# Patient Record
Sex: Male | Born: 1940
Health system: Southern US, Community
[De-identification: ages and names within clinical notes are randomized; demographics above are authoritative.]

## PROBLEM LIST (undated history)

## (undated) DIAGNOSIS — G4733 Obstructive sleep apnea (adult) (pediatric): Secondary | ICD-10-CM

## (undated) DIAGNOSIS — G629 Polyneuropathy, unspecified: Secondary | ICD-10-CM

## (undated) DIAGNOSIS — D696 Thrombocytopenia, unspecified: Secondary | ICD-10-CM

## (undated) DIAGNOSIS — C61 Malignant neoplasm of prostate: Secondary | ICD-10-CM

## (undated) DIAGNOSIS — M549 Dorsalgia, unspecified: Secondary | ICD-10-CM

## (undated) DIAGNOSIS — K5900 Constipation, unspecified: Secondary | ICD-10-CM

## (undated) DIAGNOSIS — I38 Endocarditis, valve unspecified: Secondary | ICD-10-CM

## (undated) DIAGNOSIS — Z86718 Personal history of other venous thrombosis and embolism: Secondary | ICD-10-CM

## (undated) DIAGNOSIS — D649 Anemia, unspecified: Secondary | ICD-10-CM

## (undated) DIAGNOSIS — R609 Edema, unspecified: Secondary | ICD-10-CM

## (undated) DIAGNOSIS — Z9289 Personal history of other medical treatment: Secondary | ICD-10-CM

## (undated) DIAGNOSIS — I350 Nonrheumatic aortic (valve) stenosis: Secondary | ICD-10-CM

## (undated) DIAGNOSIS — M542 Cervicalgia: Secondary | ICD-10-CM

## (undated) DIAGNOSIS — H269 Unspecified cataract: Secondary | ICD-10-CM

## (undated) DIAGNOSIS — T4145XA Adverse effect of unspecified anesthetic, initial encounter: Secondary | ICD-10-CM

## (undated) DIAGNOSIS — E785 Hyperlipidemia, unspecified: Secondary | ICD-10-CM

## (undated) DIAGNOSIS — I440 Atrioventricular block, first degree: Secondary | ICD-10-CM

## (undated) DIAGNOSIS — R0602 Shortness of breath: Secondary | ICD-10-CM

## (undated) DIAGNOSIS — T8859XA Other complications of anesthesia, initial encounter: Secondary | ICD-10-CM

## (undated) DIAGNOSIS — R350 Frequency of micturition: Secondary | ICD-10-CM

## (undated) DIAGNOSIS — I4510 Unspecified right bundle-branch block: Secondary | ICD-10-CM

## (undated) DIAGNOSIS — L0390 Cellulitis, unspecified: Secondary | ICD-10-CM

## (undated) DIAGNOSIS — R3915 Urgency of urination: Secondary | ICD-10-CM

## (undated) DIAGNOSIS — I509 Heart failure, unspecified: Secondary | ICD-10-CM

## (undated) DIAGNOSIS — I10 Essential (primary) hypertension: Secondary | ICD-10-CM

## (undated) DIAGNOSIS — M1990 Unspecified osteoarthritis, unspecified site: Secondary | ICD-10-CM

## (undated) DIAGNOSIS — M7989 Other specified soft tissue disorders: Secondary | ICD-10-CM

## (undated) DIAGNOSIS — M545 Low back pain: Secondary | ICD-10-CM

## (undated) DIAGNOSIS — G5730 Lesion of lateral popliteal nerve, unspecified lower limb: Secondary | ICD-10-CM

## (undated) DIAGNOSIS — M719 Bursopathy, unspecified: Secondary | ICD-10-CM

## (undated) DIAGNOSIS — I872 Venous insufficiency (chronic) (peripheral): Secondary | ICD-10-CM

## (undated) DIAGNOSIS — J189 Pneumonia, unspecified organism: Secondary | ICD-10-CM

## (undated) DIAGNOSIS — Z8619 Personal history of other infectious and parasitic diseases: Secondary | ICD-10-CM

## (undated) DIAGNOSIS — I4819 Other persistent atrial fibrillation: Secondary | ICD-10-CM

## (undated) DIAGNOSIS — M2550 Pain in unspecified joint: Secondary | ICD-10-CM

## (undated) DIAGNOSIS — IMO0002 Reserved for concepts with insufficient information to code with codable children: Secondary | ICD-10-CM

## (undated) HISTORY — DX: Obstructive sleep apnea (adult) (pediatric): G47.33

## (undated) HISTORY — DX: Nonrheumatic aortic (valve) stenosis: I35.0

## (undated) HISTORY — DX: Unspecified osteoarthritis, unspecified site: M19.90

## (undated) HISTORY — PX: TOTAL KNEE ARTHROPLASTY: SHX125

## (undated) HISTORY — DX: Endocarditis, valve unspecified: I38

## (undated) HISTORY — PX: KNEE SURGERY: SHX244

## (undated) HISTORY — PX: SPINE SURGERY: SHX786

## (undated) HISTORY — DX: Personal history of other infectious and parasitic diseases: Z86.19

## (undated) HISTORY — DX: Bursopathy, unspecified: M71.9

## (undated) HISTORY — DX: Lesion of lateral popliteal nerve, unspecified lower limb: G57.30

## (undated) HISTORY — DX: Dorsalgia, unspecified: M54.9

## (undated) HISTORY — DX: Cervicalgia: M54.2

## (undated) HISTORY — DX: Pain in unspecified joint: M25.50

## (undated) HISTORY — PX: HERNIA REPAIR: SHX51

## (undated) HISTORY — DX: Morbid (severe) obesity due to excess calories: E66.01

## (undated) HISTORY — DX: Reserved for concepts with insufficient information to code with codable children: IMO0002

## (undated) HISTORY — DX: Hyperlipidemia, unspecified: E78.5

## (undated) HISTORY — DX: Anemia, unspecified: D64.9

## (undated) HISTORY — DX: Cellulitis, unspecified: L03.90

## (undated) HISTORY — DX: Low back pain: M54.5

## (undated) HISTORY — PX: LAMINOTOMY: SHX998

## (undated) HISTORY — DX: Other specified soft tissue disorders: M79.89

## (undated) HISTORY — DX: Shortness of breath: R06.02

---
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
## 1985-06-21 ENCOUNTER — Encounter: Payer: Self-pay | Admitting: Internal Medicine

## 1985-07-17 ENCOUNTER — Encounter (INDEPENDENT_AMBULATORY_CARE_PROVIDER_SITE_OTHER): Payer: Self-pay | Admitting: *Deleted

## 1990-06-30 ENCOUNTER — Encounter: Payer: Self-pay | Admitting: Internal Medicine

## 1992-07-21 HISTORY — PX: LAMINOTOMY: SHX998

## 2005-07-05 ENCOUNTER — Encounter: Admission: RE | Admit: 2005-07-05 | Discharge: 2005-07-05 | Payer: Self-pay | Admitting: Neurosurgery

## 2006-01-20 ENCOUNTER — Ambulatory Visit: Payer: Self-pay | Admitting: Internal Medicine

## 2006-11-24 ENCOUNTER — Encounter: Payer: Self-pay | Admitting: Cardiovascular Disease

## 2007-01-20 ENCOUNTER — Ambulatory Visit: Payer: Self-pay | Admitting: Internal Medicine

## 2007-05-18 ENCOUNTER — Encounter
Admission: RE | Admit: 2007-05-18 | Discharge: 2007-05-18 | Payer: Self-pay | Admitting: Physical Medicine and Rehabilitation

## 2007-10-24 ENCOUNTER — Encounter: Payer: Self-pay | Admitting: Cardiovascular Disease

## 2007-12-11 ENCOUNTER — Inpatient Hospital Stay (HOSPITAL_COMMUNITY): Admission: RE | Admit: 2007-12-11 | Discharge: 2007-12-15 | Payer: Self-pay | Admitting: Orthopedic Surgery

## 2007-12-15 ENCOUNTER — Inpatient Hospital Stay (HOSPITAL_COMMUNITY): Admission: EM | Admit: 2007-12-15 | Discharge: 2007-12-22 | Payer: Self-pay | Admitting: Emergency Medicine

## 2008-01-18 DIAGNOSIS — R011 Cardiac murmur, unspecified: Secondary | ICD-10-CM | POA: Insufficient documentation

## 2008-02-19 ENCOUNTER — Ambulatory Visit: Payer: Self-pay | Admitting: Internal Medicine

## 2008-02-19 DIAGNOSIS — G4733 Obstructive sleep apnea (adult) (pediatric): Secondary | ICD-10-CM | POA: Insufficient documentation

## 2008-03-06 ENCOUNTER — Encounter: Admission: RE | Admit: 2008-03-06 | Discharge: 2008-03-06 | Payer: Self-pay | Admitting: Orthopedic Surgery

## 2008-05-20 ENCOUNTER — Ambulatory Visit: Payer: Self-pay | Admitting: Vascular Surgery

## 2008-06-08 ENCOUNTER — Inpatient Hospital Stay (HOSPITAL_COMMUNITY): Admission: EM | Admit: 2008-06-08 | Discharge: 2008-06-14 | Payer: Self-pay | Admitting: Neurosurgery

## 2008-06-13 ENCOUNTER — Ambulatory Visit: Payer: Self-pay | Admitting: Physical Medicine & Rehabilitation

## 2008-06-14 ENCOUNTER — Inpatient Hospital Stay (HOSPITAL_COMMUNITY)
Admission: RE | Admit: 2008-06-14 | Discharge: 2008-06-28 | Payer: Self-pay | Admitting: Physical Medicine & Rehabilitation

## 2008-08-02 ENCOUNTER — Encounter
Admission: RE | Admit: 2008-08-02 | Discharge: 2008-08-05 | Payer: Self-pay | Admitting: Physical Medicine & Rehabilitation

## 2008-08-05 ENCOUNTER — Ambulatory Visit: Payer: Self-pay | Admitting: Physical Medicine & Rehabilitation

## 2009-01-27 ENCOUNTER — Inpatient Hospital Stay (HOSPITAL_COMMUNITY): Admission: RE | Admit: 2009-01-27 | Discharge: 2009-01-30 | Payer: Self-pay | Admitting: Orthopedic Surgery

## 2009-03-18 ENCOUNTER — Ambulatory Visit: Payer: Self-pay | Admitting: Internal Medicine

## 2009-04-01 ENCOUNTER — Telehealth: Payer: Self-pay | Admitting: Internal Medicine

## 2009-05-08 ENCOUNTER — Encounter: Payer: Self-pay | Admitting: Cardiovascular Disease

## 2009-05-15 ENCOUNTER — Encounter: Payer: Self-pay | Admitting: Cardiovascular Disease

## 2009-05-22 ENCOUNTER — Encounter: Payer: Self-pay | Admitting: Internal Medicine

## 2009-06-19 ENCOUNTER — Encounter: Payer: Self-pay | Admitting: Cardiovascular Disease

## 2009-07-03 ENCOUNTER — Telehealth: Payer: Self-pay | Admitting: Internal Medicine

## 2009-08-26 ENCOUNTER — Telehealth: Payer: Self-pay | Admitting: Internal Medicine

## 2009-08-27 ENCOUNTER — Encounter: Payer: Self-pay | Admitting: Internal Medicine

## 2009-09-15 ENCOUNTER — Encounter: Payer: Self-pay | Admitting: Internal Medicine

## 2009-10-02 ENCOUNTER — Encounter: Payer: Self-pay | Admitting: Internal Medicine

## 2009-11-17 ENCOUNTER — Encounter: Payer: Self-pay | Admitting: Cardiovascular Disease

## 2009-11-18 ENCOUNTER — Encounter: Admission: RE | Admit: 2009-11-18 | Discharge: 2009-12-30 | Payer: Self-pay | Admitting: Orthopedic Surgery

## 2010-03-17 ENCOUNTER — Ambulatory Visit: Payer: Self-pay | Admitting: Internal Medicine

## 2010-03-19 ENCOUNTER — Telehealth (INDEPENDENT_AMBULATORY_CARE_PROVIDER_SITE_OTHER): Payer: Self-pay | Admitting: *Deleted

## 2010-04-14 DIAGNOSIS — M1990 Unspecified osteoarthritis, unspecified site: Secondary | ICD-10-CM | POA: Insufficient documentation

## 2010-04-14 DIAGNOSIS — I38 Endocarditis, valve unspecified: Secondary | ICD-10-CM | POA: Insufficient documentation

## 2010-04-14 DIAGNOSIS — E7800 Pure hypercholesterolemia, unspecified: Secondary | ICD-10-CM | POA: Insufficient documentation

## 2010-04-14 DIAGNOSIS — G4730 Sleep apnea, unspecified: Secondary | ICD-10-CM | POA: Insufficient documentation

## 2010-04-14 DIAGNOSIS — M7150 Other bursitis, not elsewhere classified, unspecified site: Secondary | ICD-10-CM | POA: Insufficient documentation

## 2010-04-14 DIAGNOSIS — D649 Anemia, unspecified: Secondary | ICD-10-CM | POA: Insufficient documentation

## 2010-04-14 DIAGNOSIS — D539 Nutritional anemia, unspecified: Secondary | ICD-10-CM | POA: Insufficient documentation

## 2010-04-14 DIAGNOSIS — M539 Dorsopathy, unspecified: Secondary | ICD-10-CM | POA: Insufficient documentation

## 2010-04-14 DIAGNOSIS — I359 Nonrheumatic aortic valve disorder, unspecified: Secondary | ICD-10-CM | POA: Insufficient documentation

## 2010-04-14 DIAGNOSIS — M129 Arthropathy, unspecified: Secondary | ICD-10-CM | POA: Insufficient documentation

## 2010-04-14 DIAGNOSIS — M109 Gout, unspecified: Secondary | ICD-10-CM | POA: Insufficient documentation

## 2010-04-15 ENCOUNTER — Ambulatory Visit: Payer: Self-pay | Admitting: Cardiovascular Disease

## 2010-04-15 DIAGNOSIS — R609 Edema, unspecified: Secondary | ICD-10-CM | POA: Insufficient documentation

## 2010-04-16 ENCOUNTER — Encounter: Payer: Self-pay | Admitting: Cardiovascular Disease

## 2010-05-04 ENCOUNTER — Ambulatory Visit (HOSPITAL_COMMUNITY): Admission: RE | Admit: 2010-05-04 | Discharge: 2010-05-04 | Payer: Self-pay | Admitting: Cardiovascular Disease

## 2010-05-04 ENCOUNTER — Ambulatory Visit: Payer: Self-pay | Admitting: Cardiology

## 2010-05-04 ENCOUNTER — Encounter (INDEPENDENT_AMBULATORY_CARE_PROVIDER_SITE_OTHER): Payer: Self-pay | Admitting: *Deleted

## 2010-05-04 ENCOUNTER — Ambulatory Visit: Payer: Self-pay

## 2010-05-04 ENCOUNTER — Encounter: Payer: Self-pay | Admitting: Cardiovascular Disease

## 2010-05-19 ENCOUNTER — Encounter: Payer: Self-pay | Admitting: Cardiovascular Disease

## 2010-05-27 ENCOUNTER — Ambulatory Visit (HOSPITAL_COMMUNITY): Admission: RE | Admit: 2010-05-27 | Discharge: 2010-05-27 | Payer: Self-pay | Admitting: Cardiovascular Disease

## 2010-05-27 ENCOUNTER — Ambulatory Visit: Payer: Self-pay | Admitting: Cardiovascular Disease

## 2010-05-28 ENCOUNTER — Telehealth: Payer: Self-pay | Admitting: Cardiovascular Disease

## 2010-05-28 ENCOUNTER — Encounter: Payer: Self-pay | Admitting: Cardiovascular Disease

## 2010-07-01 ENCOUNTER — Encounter
Admission: RE | Admit: 2010-07-01 | Discharge: 2010-07-01 | Payer: Self-pay | Source: Home / Self Care | Attending: Cardiovascular Disease | Admitting: Cardiovascular Disease

## 2010-07-01 ENCOUNTER — Encounter: Payer: Self-pay | Admitting: Cardiovascular Disease

## 2010-08-31 ENCOUNTER — Encounter: Payer: Self-pay | Admitting: Internal Medicine

## 2010-10-20 NOTE — Letter (Signed)
Summary: [facility]   Imported By: Marilynne Drivers 04/14/2010 16:58:06  _____________________________________________________________________  External Attachment:    Type:   Image     Comment:   External Document

## 2010-10-20 NOTE — Progress Notes (Signed)
  Records Recieved from [facility] for appt w/ [name] 04/15/10 gave to [place]  March 19, 2010 2:10 PM

## 2010-10-20 NOTE — Letter (Signed)
Summary: [facility]   [facility] [facility]   Imported By: Sallee Provencal 07/20/2010 15:19:31  _____________________________________________________________________  External Attachment:    Type:   Image     Comment:   External Document

## 2010-10-20 NOTE — Progress Notes (Signed)
Summary: pt needs referral to dietician  Phone Note Call from Patient Call back at Home Phone [phone]   Caller: Patient Reason for Call: Talk to Nurse, Talk to Doctor Summary of Call: pt needs to go on a diet and he needs a referral to a reg dietician Initial call taken by: Shelda Pal,  May 28, 2010 10:17 AM  Follow-up for Phone Call        referral made Fredia Beets, RN  May 28, 2010 1:03 PM

## 2010-10-20 NOTE — Letter (Signed)
Summary: TEE Instructions  [place] [place]  [address] [place]   [place], [place] [address]   Phone: [phone]  Fax: [phone]      TEE Instructions  05/19/2010 MRN: [id number]  [address] [place], [place]  [address]      You are scheduled for a TEE on [facility] 05-27-10 with Dr. [name].  Please arrive at the [facility] at 10:30 a.m.  on the day of your procedure.  1)   Diet:     A)   Nothing to eat or drink after midnight except your medications with        a sip of water.    B)   May have clear liquid breakfast.  Clear liquids include:  water, broth,        Sprite, Ginger Ale, black cofee, tea (no sugar), cranberry / grape /        apple juice, jello (not red), popsicle from clear juices (not red).  2)  Must have a responsible person to drive you home.  3)   Bring your current insurance cards and current list of all your medications.   *Special Note:  Every effort is made to have your procedure done on time.  Occasionally there are emergencies that present themselves at the hospital that may cause delays.  Please be patient if a delay does occur.  *If you have any questions after you get home, please call the office at [phone].

## 2010-10-20 NOTE — Letter (Signed)
Summary: CPAP set-up/[facility]  CPAP set-up/[facility]   Imported By: Bubba Hales 09/22/2009 08:37:52  _____________________________________________________________________  External Attachment:    Type:   Image     Comment:   External Document

## 2010-10-20 NOTE — Letter (Signed)
Summary: Outpatient Coinsurance Notice  Outpatient Coinsurance Notice   Imported By: Marilynne Drivers 05/15/2010 12:00:19  _____________________________________________________________________  External Attachment:    Type:   Image     Comment:   External Document

## 2010-10-20 NOTE — Letter (Signed)
Summary: [facility]   Imported By: Marilynne Drivers 04/14/2010 16:54:58  _____________________________________________________________________  External Attachment:    Type:   Image     Comment:   External Document

## 2010-10-20 NOTE — Assessment & Plan Note (Signed)
Summary: f/u 1 yr///kp   Primary Provider/Referring Provider:  [facility]  CC:  follow up visit-sleep.  History of Present Illness: CC:  1 year follow-up sleep apnea.  Wife reports snoring over CPAP.  New machine causing incr nasal congestion and headache and never had before.  [name]  History of Present Illness: 2008/02/25  OBSTRUCTIVE SLEEP APNEA (ICD-327.23) Hx of MURMUR (ICD-785.2) He returns for yearly follow-up of his sleep apnea.  CPAP is set at 17 CWP.  His wife says he snores through this.  He has a nasal mask and humidifier.  He is noticing more daytime sleepiness.  He had an echocardiogram and was told his heart murmur is stable.  He had had a repeat total knee replacement on the right done in March.  He was not told that there were any respiratory problems with surgery.  He has been a pipe smoker.  03/18/09 OSA ....................Marland Kitchenwife here Had vascular evaluationfor peripheral vin/ artery disease but says none found- no insufficiency.  Wife still notices some snore noise. CPAP was increased to 18- Advanced. He feels well rested. One night humidifier off gave him headache. They both feel he is better off, but wife isn't sure he's as good as we can get him.  March 17, 2010- OSA..........................Marland Kitchenwife here Good compliance and fairly good control on CPAP 15 download - AHI 6. He is comfortable with this CPAP pressure and wife confirms no snoring. Little daytime sleepiness.  Not able to lose weight since here. Denies head or chest congestion, chest pain or routine cough. Nasal mask.  Hx Mitral murmur - asks to establish with cardiology here to replace Dr Glade Lloyd.       Preventive Screening-Counseling & Management  Alcohol-Tobacco     Smoking Status: quit     Year Quit: 2008  Current Medications (verified): 1)  Cpap At 15 Cwp Advanced 2)  Diclofenac Sodium 75 Mg  Tbec (Diclofenac Sodium) .... Take 2 By Mouth Once Daily 3)  Lipitor 40 Mg  Tabs (Atorvastatin Calcium) .... Once  Daily 4)  Aspirin 81 Mg Tbec (Aspirin) .... Take 1 Tablet By Mouth Once A Day 5)  Multivitamins   Tabs (Multiple Vitamin) .... Once Daily 6)  Senokot S 8.6-50 Mg Tabs (Sennosides-Docusate Sodium) .... Take 2 Tablets At Bedtime 7)  Percocet 5-325 Mg Tabs (Oxycodone-Acetaminophen) .... Taking 1 Tablet in Am 8)  Lisinopril 10 Mg Tabs (Lisinopril) .... 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  The patient denies shortness of breath at rest, productive cough, non-productive cough, coughing up blood, chest pain, irregular heartbeats, acid heartburn, indigestion, loss of appetite, weight change, abdominal pain, difficulty swallowing, sore throat, tooth/dental problems, headaches, nasal congestion/difficulty breathing through nose, and sneezing.    Vital Signs:  Patient profile:   70  year old male Height:      74 inches O2 Sat:      95 % on Room air Pulse rate:   66 / minute BP sitting:   140 / 78  (right arm) Cuff size:   large  Vitals Entered By: Clayborne Dana CMA (March 17, 2010 2:54 PM)  O2 Flow:  Room air CC: follow up visit-sleep   Physical Exam  Additional Exam:  General: A/Ox3; pleasant and cooperative, NAD, morbidly obese, cheerful and  joking SKIN: no rash, lesions NODES: no lymphadenopathy HEENT: [place]/AT, EOM- WNL, Conjuctivae- clear, PERRLA, TM-WNL, Nose- clear, Throat- clear and wnl, Mallampatii II-III NECK: Supple w/ fair ROM, JVD- none, normal carotid impulses w/o bruits Thyroid-  CHEST: trace wheeze left scapula HEART: RRR, 1-2/ 6 murmur mitral ?  precordial ABDOMEN: Soft and nl; very fat [id number] changes, left knee brace, bandage left calf NEURO: Grossly intact to observation      Impression & Recommendations:  Problem # 1:  OBSTRUCTIVE SLEEP APNEA (ICD-327.23)  Good compliance and control. CPAP is well tolerated now Weight loss would be the key.  Problem # 2:  Hx of MURMUR (ICD-785.2) We will honor his request to refer him to cardiology here since Dr Glade Lloyd has retired  Problem # 3:  OBESITY, MORBID (ICD-278.01) Consider whether he wants bariatric surgery evaluation.  He understands the impact of his weight on general health but I don't feel he is motivated to change lifestyle.  Medications Added to Medication List This Visit: 1)  Lisinopril 10 Mg Tabs (Lisinopril) .... Take 1 by mouth once daily  Other Orders: Est. Patient Level III [id number]) Cardiology Referral (Cardiology)  Patient Instructions: 1)  Please schedule a follow-up appointment in 12 months.  2)  Continue CPAP at 15.

## 2010-10-20 NOTE — Letter (Signed)
Summary: Order for [facility] Diabetes Outpatient Management Services     Order for Nutrition & Diabetes Outpatient Management Services    Imported By: Marilynne Drivers 08/10/2010 10:33:54  _____________________________________________________________________  External Attachment:    Type:   Image     Comment:   External Document

## 2010-10-20 NOTE — Assessment & Plan Note (Signed)
Summary: np3/murmur/jml   Primary Provider:  Dr. Annamaria Boots   History of Present Illness: [name] is a delightful gentleman referred by Dr Redmond Pulling and Annamaria Boots for AS.  He has had a longstanding heart murmur.  He has seen Dr Glade Lloyd for years and also been evaluated by [facility] at times during knee surgeries.  I reviewed 2 separate reports from Dr Michelle Piper office and unfortunatley the echos are non diagnositc with no gradient information and no data on wether the AV is bicuspid or trileaflet.  There was also an echo report from [facility] dated3/02/2007 which showed a mean gradient of 43 mmHg and a peak of 71 mmHg, mild AR and normal EF.  So essentially we have a patient with severe AS that has had poor nondiagnostic imaging with no real data for 3 years.  His activity is limited by his weight, and multiple issues with his knees.  He had a succesful left knee surgery in 2004 but has had 3-4 operations on the right that didn't go so well at [facility] with foot drop as a complicatoin.  He has chronic LE edema but denies history of periop DVT.  Despite his AS he seems to have gotten through 4 knee surgeries, and farily recent neck and back surgery with Dr Sherwood Gambler with no complicaitions.  There is a familial schwanoma syndrome that has caused spinal cord compression for the patient and his two sons.    I spent a long time explaining to the patient my concerns about the severity of his AS and the lack of accurate data in following it.  We will try our own TTE since [facility] seemed to be able to get doppler data.  If the images are not satisfactory he will need a TEE to help guide prognosis and even tell if th evalve is bicuspid.  Risks of the TEE including sedation and airway protection were discussed.    Given his physical limitatations from obesity and knee replacements I don't known that he would have symptoms from his valve until the AVA is critical.  Current Problems (verified): 1)  Valvular Heart Disease   (ICD-424.90) 2)  Aortic Stenosis  (ICD-424.1) 3)  Hx of Murmur  (ICD-785.2) 4)  Osteoarthritis  (ICD-715.90) 5)  Anemia  (ICD-285.9) 6)  Sleep Apnea  (ICD-780.57) 7)  Bursitis  (ICD-727.3) 8)  Gout  (ICD-274.9) 9)  Degenerative Disc Disease  (ICD-722.6) 10)  Arthritis  (ICD-716.90) 11)  Hypercholesterolemia  (ICD-272.0) 12)  Obstructive Sleep Apnea  (ICD-327.23) 13)  Obesity, Morbid  (ICD-278.01)  Current Medications (verified): 1)  Cpap At 15 Cwp Advanced .... As Directed 2)  Diclofenac Sodium 75 Mg  Tbec (Diclofenac Sodium) .... Take 2 By Mouth Once Daily 3)  Lipitor 40 Mg  Tabs (Atorvastatin Calcium) .... Once Daily 4)  Aspirin 81 Mg Tbec (Aspirin) .... Take 1 Tablet By Mouth Once A Day 5)  Multivitamins   Tabs (Multiple Vitamin) .... Once Daily 6)  Senokot S 8.6-50 Mg Tabs (Sennosides-Docusate Sodium) .... Take 2 Tablets At Bedtime 7)  Lisinopril 10 Mg Tabs (Lisinopril) .... Take 1 By Mouth Once Daily  Allergies (verified): No Known Drug Allergies  Past History:  Past Medical History: Last updated: 04/14/2010 VALVULAR HEART DISEASE AORTIC STENOSIS  Moderate-severe aortic stenosis with cardiac murmur. Echo by [facility] 2010 nondiagnostic OSTEOARTHRITIS ANEMIA SLEEP APNEA  BURSITIS GOUT ARTHRITIS  HYPERCHOLESTEROLEMIA OBSTRUCTIVE SLEEP APNEA OBESITY, MORBID  Unstable right total knee Degenerative disk disease with lumbar stenosis Significant right foot drop secondary to peroneal palsy,  preexisting.  Sensory-motor polyneuropathy  Past Surgical History: Last updated: 04/14/2010 R knee replacement in march Had spine surgery.  He has undergone I and D  for hematoma of the right knee.  C6-T2 laminotomy in 1993.  Bilateral shoulder arthropathy and schwannoma ptosis.        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   Vital Signs:  Patient profile:   70 year old male Height:      74 inches Weight:      296 pounds BMI:     38.14 Pulse rate:   82 / minute Resp:     14 per minute BP sitting:   123 / 67  (left arm)  Vitals Entered By: Burnett Kanaris (April 15, 2010 3:32 PM)  Physical Exam  General:  Affect appropriate Healthy:  appears stated age 26: normal Neck supple with no adenopathy JVP normal no bruits no thyromegaly Lungs clear with no wheezing and good diaphragmatic motion Heart:  S1/S2 absent with severe AS  murmur no ,rub, gallop or click PMI normal Abdomen: benighn, BS positve, no tenderness, no AAA no bruit.  No HSM or HJR Distal pulses intact with no bruits Bilateral  edema Neuro non-focal Skin warm and dry    Impression & Recommendations:  Problem # 1:  VALVULAR HEART DISEASE (ICD-424.90) Severe AS with limited serial data since 2008.  F/U TTE and likely TEE.  Would be high risk to even cath and very high risk to refer for AVR.  Suspect that TEE will be needed to finally visualize valve as this has never been able to be done by TTE  Problem # 2:  HYPERCHOLESTEROLEMIA (ICD-272.0) Continue statin labs per primary His updated medication list for this problem includes:    Lipitor 40 Mg Tabs (Atorvastatin calcium) ..... Once daily  Problem # 3:  OSTEOARTHRITIS (ICD-715.90) F/U ortho.  Left knee is still not good.  Disallusioned by [facility].  f/U locally.  High risk for DVT with any further intervention  Problem # 4:  EDEMA (ICD-782.3) Would benefit from a diuretic  Will see what echo looks like and consider  prescribing.  Problem # 5:  SLEEP APNEA (ICD-780.57) Secondary to obesity.  F/U Dr Annamaria Boots continue CPAP.  Increase risk of sedation as relates to this discussed  Problem # 6:  OBESITY, MORBID (ICD-278.01) At this point would not be a candidate for bariatric surgery due to severe AS.  Gave him Dr Manya Silvas name at [facility] regarding ancillary weight loss programs  Other Orders: EKG w/ Interpretation (93000) Echocardiogram (Echo)  Patient Instructions: 1)  Your physician has requested that you have an echocardiogram.  Echocardiography is a painless test that uses sound waves to create images of your heart. It provides your doctor with information about the size and shape of your heart and how well your heart's chambers and valves are working.  This procedure takes approximately one hour. There are no restrictions for this procedure. We will call you with results and decide about follow up. Prescriptions: LISINOPRIL 10  MG TABS (LISINOPRIL) take 1 by mouth once daily  #90 x 3   Entered by:   Burnett Kanaris   Authorized by:   Bosie Clos, MD, [facility]   Signed by:   Burnett Kanaris on 04/15/2010   Method used:   Electronically to        [facility] [name] (retail)       [name]       [place], [place]  [address]       Ph: [phone]       Fax: [phone]   [id number] LISINOPRIL 10 MG TABS (LISINOPRIL) take 1 by mouth once daily  #30 x 12   Entered by:   Burnett Kanaris   Authorized by:   Bosie Clos, MD, [facility]   Signed by:   Burnett Kanaris on 04/15/2010   Method used:   Electronically to        [facility] [name] (retail)       [name]       [place],   [address]       Ph: [phone]       Fax: [phone]   RxID:   [id number]     EKG Report  Procedure date:  04/15/2010  Findings:      NSR 84 PR 214 [id number]

## 2010-10-20 NOTE — Letter (Signed)
Summary: [facility]   Imported By: Marilynne Drivers 04/14/2010 16:54:37  _____________________________________________________________________  External Attachment:    Type:   Image     Comment:   External Document

## 2010-10-22 NOTE — Letter (Signed)
Summary: CMN for PAP Supplies/[facility]  CMN for PAP Supplies/[facility]   Imported By: Phillis Knack 09/15/2010 10:55:33  _____________________________________________________________________  External Attachment:    Type:   Image     Comment:   External Document

## 2010-11-02 ENCOUNTER — Telehealth: Payer: Self-pay | Admitting: Internal Medicine

## 2010-11-11 NOTE — Progress Notes (Signed)
Summary: CPAP increased to 16  Phone Note Other Incoming   Summary of Call: Wife in my office for her own care. Tells me that since husband's CPAP pressure reduced to 15 it has been too low, with return of loud snoring and witnessed apneas. We will go back up to 16. If no better, then plan autotitration.     New/Updated Medications: * CPAP AT 16 CWP ADVANCED as directed

## 2010-12-29 LAB — CBC
HCT: 33.2 % — ABNORMAL LOW (ref 39.0–52.0)
HCT: 34 % — ABNORMAL LOW (ref 39.0–52.0)
HCT: 34.7 % — ABNORMAL LOW (ref 39.0–52.0)
Hemoglobin: 11.1 g/dL — ABNORMAL LOW (ref 13.0–17.0)
Hemoglobin: 11.6 g/dL — ABNORMAL LOW (ref 13.0–17.0)
Hemoglobin: 11.8 g/dL — ABNORMAL LOW (ref 13.0–17.0)
MCHC: 33.4 g/dL (ref 30.0–36.0)
MCHC: 34 g/dL (ref 30.0–36.0)
MCHC: 34.1 g/dL (ref 30.0–36.0)
MCV: 92.9 fL (ref 78.0–100.0)
MCV: 93.1 fL (ref 78.0–100.0)
MCV: 93.1 fL (ref 78.0–100.0)
Platelets: 131 10*3/uL — ABNORMAL LOW (ref 150–400)
Platelets: 134 10*3/uL — ABNORMAL LOW (ref 150–400)
Platelets: 136 10*3/uL — ABNORMAL LOW (ref 150–400)
RBC: 3.56 MIL/uL — ABNORMAL LOW (ref 4.22–5.81)
RBC: 3.66 MIL/uL — ABNORMAL LOW (ref 4.22–5.81)
RBC: 3.73 MIL/uL — ABNORMAL LOW (ref 4.22–5.81)
RDW: 16.1 % — ABNORMAL HIGH (ref 11.5–15.5)
RDW: 16.4 % — ABNORMAL HIGH (ref 11.5–15.5)
RDW: 16.5 % — ABNORMAL HIGH (ref 11.5–15.5)
WBC: 7.6 10*3/uL (ref 4.0–10.5)
WBC: 8 10*3/uL (ref 4.0–10.5)
WBC: 8.4 10*3/uL (ref 4.0–10.5)

## 2010-12-29 LAB — BASIC METABOLIC PANEL WITH GFR
BUN: 11 mg/dL (ref 6–23)
BUN: 13 mg/dL (ref 6–23)
CO2: 32 meq/L (ref 19–32)
CO2: 34 meq/L — ABNORMAL HIGH (ref 19–32)
Calcium: 8.7 mg/dL (ref 8.4–10.5)
Calcium: 8.8 mg/dL (ref 8.4–10.5)
Chloride: 101 meq/L (ref 96–112)
Chloride: 101 meq/L (ref 96–112)
Creatinine, Ser: 0.83 mg/dL (ref 0.4–1.5)
Creatinine, Ser: 0.87 mg/dL (ref 0.4–1.5)
GFR calc non Af Amer: >60 mL/min
GFR calc non Af Amer: >60 mL/min
Glucose, Bld: 102 mg/dL — ABNORMAL HIGH (ref 70–99)
Glucose, Bld: 128 mg/dL — ABNORMAL HIGH (ref 70–99)
Potassium: 4 meq/L (ref 3.5–5.1)
Potassium: 4.5 meq/L (ref 3.5–5.1)
Sodium: 140 meq/L (ref 135–145)
Sodium: 140 meq/L (ref 135–145)

## 2010-12-29 LAB — PROTIME-INR
INR: 1 (ref 0.00–1.49)
INR: 1.1 (ref 0.00–1.49)
INR: 1.1 (ref 0.00–1.49)
Prothrombin Time: 13.8 seconds (ref 11.6–15.2)
Prothrombin Time: 14.5 seconds (ref 11.6–15.2)
Prothrombin Time: 14.9 s (ref 11.6–15.2)

## 2010-12-29 LAB — TYPE AND SCREEN
ABO/RH(D): O POS
Antibody Screen: NEGATIVE

## 2010-12-30 LAB — URINALYSIS, ROUTINE W REFLEX MICROSCOPIC
Bilirubin Urine: NEGATIVE
Glucose, UA: NEGATIVE mg/dL
Hgb urine dipstick: NEGATIVE
Ketones, ur: NEGATIVE mg/dL
Leukocytes, UA: NEGATIVE
Nitrite: POSITIVE — AB
Protein, ur: NEGATIVE mg/dL
Specific Gravity, Urine: 1.013 (ref 1.005–1.030)
Urobilinogen, UA: 0.2 mg/dL (ref 0.0–1.0)
pH: 6 (ref 5.0–8.0)

## 2010-12-30 LAB — COMPREHENSIVE METABOLIC PANEL
ALT: 44 U/L (ref 0–53)
AST: 25 U/L (ref 0–37)
Albumin: 3.4 g/dL — ABNORMAL LOW (ref 3.5–5.2)
Alkaline Phosphatase: 91 U/L (ref 39–117)
BUN: 18 mg/dL (ref 6–23)
CO2: 31 mEq/L (ref 19–32)
Calcium: 9.1 mg/dL (ref 8.4–10.5)
Chloride: 105 mEq/L (ref 96–112)
Creatinine, Ser: 0.94 mg/dL (ref 0.4–1.5)
GFR calc Af Amer: >60 mL/min (ref >60)
GFR calc non Af Amer: >60 mL/min (ref >60)
Glucose, Bld: 95 mg/dL (ref 70–99)
Potassium: 4.9 mEq/L (ref 3.5–5.1)
Sodium: 142 mEq/L (ref 135–145)
Total Bilirubin: 0.5 mg/dL (ref 0.3–1.2)
Total Protein: 6.2 g/dL (ref 6.0–8.3)

## 2010-12-30 LAB — CBC
HCT: 37.4 % — ABNORMAL LOW (ref 39.0–52.0)
Hemoglobin: 12.5 g/dL — ABNORMAL LOW (ref 13.0–17.0)
MCHC: 33.3 g/dL (ref 30.0–36.0)
MCV: 92.6 fL (ref 78.0–100.0)
Platelets: 165 10*3/uL (ref 150–400)
RBC: 4.04 MIL/uL — ABNORMAL LOW (ref 4.22–5.81)
RDW: 16.7 % — ABNORMAL HIGH (ref 11.5–15.5)
WBC: 7.2 10*3/uL (ref 4.0–10.5)

## 2010-12-30 LAB — APTT: aPTT: 30 seconds (ref 24–37)

## 2010-12-30 LAB — PROTIME-INR
INR: 1 (ref 0.00–1.49)
Prothrombin Time: 13.6 seconds (ref 11.6–15.2)

## 2010-12-30 LAB — URINE MICROSCOPIC-ADD ON

## 2011-02-02 NOTE — Discharge Summary (Signed)
NAMEWELLINGTON, AMLIN                 ACCOUNT NO.:  [id number]   MEDICAL RECORD NO.:  [id number]          PATIENT TYPE:  IPS   LOCATION:  [id number]                         FACILITY:  [facility]   PHYSICIAN:  Meredith Staggers, M.D.DATE OF BIRTH:  08-21-1941   DATE OF ADMISSION:  06/14/2008  DATE OF DISCHARGE:  06/28/2008                               DISCHARGE SUMMARY   DISCHARGE DIAGNOSES:  1. lumbar stenosis with progressive claudication and radiculopathy      requiring decompressive laminectomy.  2. Pseudomonas urinary tract infections treated.  3. Acute blood loss anemia.  4. Obstructive sleep apnea.  5. Morbid obesity.  6. Right foot drop due to peroneal nerve palsy.  7. Gout.   HISTORY OF PRESENT ILLNESS:  Mr. Jaggard is a 70 year old male with  history of morbid obesity, polyneuropathy admitted June 08, 2008  with 2-day history of falls with increase in lower extremity weakness.  X-rays done revealed severe degenerative changes and lumbar spinal  stenosis.  Neurosurgery was consulted for input and the patient elected  to undergo L1 to L5 decompressive laminectomy on June 12, 2008 by  Dr. Sherwood Gambler.  PT, OT eval done and the patient is noted to have  impairments in mobility and self-care problems with weight shifting and  decreased sensation in bilateral lower extremity.  Rehab was consulted  for further therapies.   PAST MEDICAL HISTORY:  Significant for  1. Obstructive sleep apnea.  2. Morbid obesity.  3. Gout.  4. Moderate-to-severe aortic stenosis.  5. Bilateral total knee replacement with right total knee revision.  6. Right foot drop due to peroneal nerve palsy.  7. Chronic left shin ulcer.  8. Sensory-motor polyneuropathy.  9. C6-T2 laminotomy in 1993.  10.Bilateral shoulder arthropathy and schwannoma ptosis.   ALLERGIES:  No known drug allergies.   FAMILY HISTORY:  Negative for any chronic illnesses.   SOCIAL HISTORY:  The patient is married, lives in one level  home with  two to three steps at entry.  Smokes a pipe twice a day.  Does not use  any alcohol.  The patient is a retired disabled Administrator.  Wife  works, however, the patient has a supportive family that is to work  together to provide 24-hour supervision past discharge.   FUNCTIONAL HISTORY:  The patient was independent with a walker prior to  admission.  He was mostly house bound secondary to bilateral lower  extremity problems.  The patient reports problems with transfers from  lower surfaces chronically.   FUNCTIONAL STATUS:  The patient is setup for upper body care, total  assist for lower body care, __________ with the patient 60% for  standing.  Plus to total assist 80% for ambulating 75 feet.   PHYSICAL EXAMINATION:  GENERAL:  The patient is alert and oriented,  morbidly obese male in no acute distress.  HEENT:  Pupils equal, round, and reactive to light.  Nares patent.  Tongue midline.  Oropharynx shows fair dentition.  Moist mucosa.  NECK:  Supple without JVD or lymphadenopathy.  LUNGS:  Clear to auscultation bilaterally.  No wheezes or  rhonchi.  HEART:  Notable for systolic murmur, otherwise no rubs or gallops  appreciated.  EXTREMITY:  Showed 2+ pitting edema left greater than right lower  extremity with chronic vascular changes.  ABDOMEN:  Obese, nontender with positive bowel sounds.  SKIN:  Lower abdominal skin folds with macerated appearing.  Back wound  with some serosanguineous drainage, dressing  otherwise well  approximated with staples in place.  Red and irritated area around the  groin, gluteal folds and belly folds.  NEUROLOGIC:  Cranial nerves II-XII intact.  Reflexes are 1+ throughout.  Sensation is decreased in all four limbs particularly lower limbs below  knee, right foot is noted for 0/5 ankle dorsiflexion, plantar flexion is  at 4/5 proximally in right lower extremity, 2/5 in the knee and hip,  left lower extremity 2/5 to 4/5 proximal to distal,  upper extremities  are 5/5.  The patient's judgment, orientation, memory and mood all  within functional limits.   HOSPITAL COURSE:  Mr. Demondre Bostic was admitted to rehab on June 14, 2008 for inpatient therapies to consist of at least 3 hours a day 5 days  a week during his rehab stay.  During his stay in rehab, physiatry,  rehab RN and therapy team have worked whether to provide customized  collaborative interdisciplinary care.  Rehab RN has been working and  assisting with skin care as well as wound care.  The patient has been  advised regarding cleansing and drying his skin folds and nystatin  powder to help with dryness.  Nursing has also been working with close  monitoring of his left shin wound.  Currently, dry eschars noted and  Eucerin cream is used on bilateral lower extremity with dry Kerlix wrap  on left shin to prevent bumping the scab and causing it to break open.  The patient's blood pressures have been monitored on b.i.d. basis  throughout his stay.  These are noted to be well controlled ranging from  [id number] systolic, 99991111 diastolic.  Last weight is at 165 kg.  The  patient's Foley was initially maintained until mobility improved.  Foley  was discontinued on June 17, 2008 and a voiding trial was  initiated.  PVRs were checked with bladder scan and the patient was  noted to be voiding without any signs or symptoms of retention.  Once  Foley discontinued, a UA and UCS was sent off.  Urine culture done  showed greater than 100,000 colonies of Pseudomonas aeruginosa.  The  patient was started on Cipro for treatment and is to continue on  antibiotics for seven total days of therapy.  The patient has had  routine check of labs during this stay.  H&H has been stable overall  with last CBC of June 21, 2008 revealing hemoglobin 9.7, hematocrit  28.6, white count 14.9, platelets 176.  The patient has leukocytosis  secondary to UTI.  Check of lytes done revealed  sodium 134, potassium  3.5, chloride 96, CO2 30, BUN 15, creatinine 0.94, glucose 120.   The patient is noted to have right foot drop.  Initially, an AFO was  ordered, however, secondary to the patient's weight, this was deemed to  be unsafe as the patient was able to overpower or __________.  The  patient felt very unsafe with severe flexion mount at knee.  Therapy  team was consulted regarding safety and benefit of KFO.  They felt this  would be beneficial as the patient with right knee instability.  A KFO  was ordered and has been fitted for use with ambulation.  During the  patient's stay, team conferences were held on a weekly basis to assess  his progress towards goal and also discuss barriers to discharge.  Family education sessions were done with wife and son with various team  members and issues regarding mobility, safety and amount of care needed  were discussed with hands on training.  At the time of admission, the  patient was noted to have decrease in balance for activity tolerance.  He was noted to be at max assist for self-care.  OT has worked initially  on bilateral upper extremity strengthening exercises as well as issues  with problem solving for shower transfers.  The patient has had issues  reaching independence goals for peri-care and Adaptic equipment was  recommended however, the patient has been hesitant about using this.  Currently, the patient is able to perform transfers at distant  supervision level.  He is at supervision level for dressing at edge of  bed with increase support for sit to stand from bed.  He still requires  min assist for bathing, peri-care, mod assist for toileting peri-care.  The patient's wife is to buy toilet aid to assist and increase the  patient's independence with ADLs.  The patient's wife and son have been  educated about the assistance needed and comfortable with providing this  past discharge.  Initial physical therapy evaluation  showed the patient  at max assist for bed mobility, mod to max assist for sit to stand  transfers with elevated bed height.  The patient was noted to be  extremely fearful with bilateral knees buckling with pre- gait activity.  The patient was able to ambulate at 50 feet x2 with min assist x2  initially.  PT has been working with the patient and scheduling rest  breaks to help with the patient's endurance levels.  Rehab RN has also  been assisting by premedicating the patient with pain medicine prior to  his therapies so that the patient could tolerate his therapy sessions  better.  Currently, the patient has made steady progress reaching min  assist to supervision level overall for mobility.  He is currently able  to navigate one step with landing x4 with a rolling walker without his  KFO.  With min assist, he is able to ambulate up to 77 feet with rolling  walker with close supervision, is at supervision for bed-chair  transfers.  He is able to get from supine to sitting with supervision  with a leg lifter.  Family education has been done in terms of transfer  training with care for gait as well as stair navigation.  The patient's  wife and son are to provide assistance as needed past discharge and the  patient is discharged to home on June 28, 2008 with further home  health PT and OT by [facility].  The patient's back  wound is currently clean and dry.  Staples discontinued on June 19, 2008 without difficulty.  Lower extremity areas continued to show dry  flaky skin with dry scab on left shin.  Home health RN has been arranged  for routine wound monitoring.   DISCHARGE MEDICATIONS:  1. Lipitor 40 mg nightly.  2. Ferrous sulfate 325 mg b.i.d.  3. Celebrex 200 mg a day.  4. Senokot-S two p.o. at bedtime.  5. Vitamin D 400 units per day.  6. Multivitamin one per day.  7. Cipro 250  mg b.i.d. through June 29, 2008.  8. OxyIR 5 mg one to two q.8-12 h. p.r.n.  pain #45 Rx-ed.   DIET:  Low-fat.   WOUND CARE:  Keep area clean and dry.   ACTIVITY LEVEL:  24-hour supervision.  No alcohol, no smoking, no  driving.  No strenuous activity.  Special instructions with CPAP at  bedtime.  Routine back precautions.  Do not use Voltaren for now.  [facility] to provide PT, OT, RN.  [facility] to provide  hospital bed.   FOLLOW UP:  The patient to follow up with Dr. Naaman Plummer as needed.  Follow  up with Dr. Sherwood Gambler for postop check in 2-3 weeks.  Follow up with Dr.  Elease Hashimoto and Armandina Gemma in 2-3 weeks for routine check to include check of  CBC and repeat UA UC.      Thornton Dales, P.A.      Meredith Staggers, M.D.  Electronically Signed    PP/MEDQ  D:  06/28/2008  T:  06/29/2008  Job:  [id number]   cc:   Carolann Littler, M.D.  Hosie Spangle, M.D.

## 2011-02-02 NOTE — Consult Note (Signed)
VASCULAR SURGERY CONSULTATION   Lance [name], Lance [name]  DOB:  02/02/1941                                       05/20/2008  [id number]   The patient was referred by Dr. Armandina Gemma for evaluation of his circulation  in both lower extremities.  This 70 year old male patient has a history  of multiple medical problems.  He has had staged bilateral knee  replacements in the last 18 months and required a redo right knee  replacement by Dr. Gaynelle Arabian in March of this year.  He has been  having some difficulty moving the toes of his right foot since his  initial right knee replacement done at [facility] a year ago.  He also  has had some numbness in the foot.  He occasionally developed some  blistering of the skin in both legs.  He does not have a history of  nonhealing ulcers and has no history of deep venous thrombosis, although  he did have what sounds like superficial thrombophlebitis in his left  leg many years ago.  He has a history of stasis ulcers or bleeding  varicosities but does have chronic edema in his legs.   PAST MEDICAL HISTORY:  1. Significant for hypertension.  2. Aortic stenosis.  3. Gout.  4. Degenerative joint disease.  5. Sleep apnea syndrome.  6. Hyperlipidemia.   SOCIAL HISTORY:  He is married and has three children is retired.  Does  not use tobacco or alcohol.   PHYSICAL EXAM:  Blood pressure 140/80, heart rate 80, respirations 14.  General:  He is a morbidly obese male who is in no acute distress.  Alert and oriented x3.  Neck is supple 3+ carotid pulses palpable.  No  bruits are audible.  Neurologic:  Normal with exception of decreased  motion in the right foot decreased sensation.  Chest:  Clear to  auscultation.  Abdomen:  Obese.  No palpable masses.  He has palpable  popliteal and 2+ dorsalis pedis pulses bilaterally.  There is mild  chronic edema in both lower extremities.  He has no stasis ulcers or  varicosities and there is no  evidence of ischemia in the lower  extremities.   Venous duplex exam revealed deep venous system be widely patent.  He  does have reflux in the greater saphenous system from the groin to the  knee bilaterally but no varicosities.  Arterial study revealed adequate  ABIs in both lower extremities.  No other evidence of significant  ischemia.   I have reassured him regarding his vascular status and do not think any  further vascular workup is indicated.  He has no significant arterial or  venous disease causing symptomatology.   Lance [name], Lance [name].  Electronically Signed  JDL/MEDQ  [name]:  05/20/2008  T:  05/21/2008  Job:  [id number]   cc:   Dr. Stanford Scotland

## 2011-02-02 NOTE — Consult Note (Signed)
NAMEJERMERE, MESCHKE                 ACCOUNT NO.:  [id number]   MEDICAL RECORD NO.:  [id number]          PATIENT TYPE:  INP   LOCATION:  [place]                         FACILITY:  [facility]   PHYSICIAN:  Alyson Locket. Granfortuna, M.D.DATE OF BIRTH:  05-27-1941   DATE OF CONSULTATION:  12/20/2007  DATE OF DISCHARGE:                                 CONSULTATION   REASON FOR CONSULTATION:  This is a hematology consultation requested to  evaluate this patient for excessive bleeding after right knee surgery.   HISTORY:  [name] is a 70 year old morbidly obese man who states that  he bleeds easily when cut.  However, he has had major surgeries in the  past without any excessive bleeding except for one time.  He underwent  extensive surgery to remove a schwannoma of his spinal cord 16 years ago  by Dr. Sherwood Gambler.  He had a left total knee replacement at [facility] 3 years ago.  He had a tooth extraction 3 months ago.  He  had a local excision of a skin cancer from his right forearm 1 month  ago.  He had no excessive bleeding after any of these procedures.  He  did have increased bleeding and required blood transfusion after initial  surgery on his right knee done at [facility] in March  2008.   He takes diclofenac twice daily and aspirin one daily on a regular  basis.  He stopped these drugs at the advice of his surgeon 1 week prior  to repeat knee surgery done by Dr. Wynelle Link on December 11, 2007.  He was  put on Coumadin postoperatively.  He was discharged on Friday.  He  states that shortly after he got home, he started to have profuse  bleeding from the right knee wound.  He was readmitted to the hospital  on December 15, 2007.  Coumadin was stopped.  His pro-time which was 26.2  seconds with INR of 2.3 but came down rapidly and is 17.2 seconds today  with an INR of 1.4.  His initial hemoglobin was 8.9 and fell to 7.6  today and a blood transfusion is in progress.  He had  a transient fall  in his platelet count perioperatively with count as low as 114,000 on  December 13, 2007.  His platelet count today is normal at 222,000.   He has no history of liver disease.  There is no family history of any  bleeding disorder in his parents who are now deceased, his 2 sisters or  a brother, or his 2 natural born sons.  He does not use alcohol.   Estimated blood loss at time of surgery on December 11, 2007 was 200 mL.  No excessive bleeding was encountered intraoperatively.  He required  evacuation of approximately 200 mL of blood from the right knee joint on  December 18, 2007.  A baseline PT/PTT were obtained prior to starting  Coumadin on December 01, 2007 and his pro-time was 13.3 seconds with a PTT  of 30 seconds.   PAST MEDICAL HISTORY:  1. Hyperlipidemia.  2. Sleep apnea syndrome.  3. Mitral valve prolapse.  4. Known heart murmur.  The patient believes he has an aortic murmur.      He has no other medical or surgical illness other than outlined      above.  5. He does have hyperlipidemia and takes Lipitor.   PREHOSPITAL MEDICATIONS:  1. Aspirin.  2. Diclofenac.  3. Multivitamin.   ALLERGIES:  NONE.   FAMILY HISTORY:  As noted.  No bleeding disorders.   SOCIAL HISTORY:  He is married.  He is now a disabled truck driver.  He  smokes an occasional cigar, no cigarettes.  No alcohol.  His 2 natural  born sons also have problems with schwannomas and have required  operations.  He has an adopted son as well.   REVIEW OF SYSTEMS:  He does not have easy bruising.  No known adrenal  condition.  He has not noted any epistaxis, gum bleeding, hematuria,  hematochezia or melena.  He does feel he has excessive bleeding if he  cuts his finger, but it usually stops within half an hour.   PHYSICAL EXAMINATION:  GENERAL:  Morbidly obese, overly-nourished  Caucasian man.  VITAL SIGNS:  Blood pressure 109/56, pulse 58 regular, respirations 14,  temperature 98.9, oxygen  saturation 98% on 4 liters of oxygen.  SKIN:  Pale.  HEENT:  The pupils are equal and reactive to light.  The pharynx without  erythema or exudate.  NECK:  Supple.  No thyromegaly.  Left carotid bruit versus transmitted  murmur.  LUNGS:  Overall clear.  HEART:  Regular cardiac rhythm with a 3/6 apical systolic murmur and a 2-  3/6 systolic murmur at the second right intercostal space.  ABDOMEN:  Soft, obese, nontender.  No obvious mass or organomegaly.  EXTREMITIES:  Extensive venostasis changes, 1-2+ edema.  Feet are warm  and well perfused.  There is significant swelling of the right knee with  a surgical dressing and a elastic bandage over it which I did not  remove.  NEUROLOGIC:  Grossly normal.   LABORATORY DATA:  CBC done today December 20, 2007 with hemoglobin 7.6,  hematocrit 22, MCV 89, white count 8300, no differential count done,  platelets 222,000.  Pro-time 17.2 seconds off Coumadin for 5 days, INR  1.4, BUN 12, creatinine 0.8.   IMPRESSION:  Excessive bleeding into the right knee following an  extensive re-do procedure on the same knee.   I suspect that he may have a qualitative platelet disorder or a mild von  Willebrand's disease.  Of note, acquired von Willebrand's disease can be  seen in association with aortic valve disease.   He has had multiple previous major and minor surgeries and except for  previous surgery on his right knee 1 year ago, he has not had any  problems with excessive bleeding.  This suggests that we may just be  dealing with local problems associated with an extensive re-do surgical  procedure complicated by Coumadin anticoagulation.   RECOMMENDATIONS:  I am going to repeat a PTT, but I anticipate it will  again be normal.  I will check a bleeding time.  The skin on his  forearms is thin and the bleeding time may therefore be spuriously  prolonged.  I will check a von Willebrand's profile.  In the interim, I  would hold all aspirin,  nonsteroidals and anticoagulants.   Thank you for this consultation.      Alyson Locket. Granfortuna,  M.D.  Electronically Signed     JMG/MEDQ  D:  12/20/2007  T:  12/20/2007  Job:  [id number]   cc:   Gaynelle Arabian, M.D.  Fax: [phone]   Freeman Caldron. Pauline Aus, M.D.  Fax: [place]  Fax: [phone]

## 2011-02-02 NOTE — Assessment & Plan Note (Signed)
[name] is back regarding his lumbar stenosis with claudication status  post decompressive laminectomy.  He was on rehab until June 28, 2008,  and went home with home therapy.  He has been doing fairly well.  He is  still getting some brace modifications done on the right KAFO.  He has  been walking without the brace at home and actually walked into my  office today, 300+ feet.  He is still having some mild back symptoms,  but overall much improved.  He still is not where he wants to be from a  strength standpoint.  He would like to get into outpatient therapy  eventually.  He saw Dr. Sherwood Gambler yesterday who gave him a good report.  He was very happy with appearance of his back.  The patient is using  minimal medications at this point for pain.  He occasionally uses an  oxycodone.  His bowels have been regular with Senokot and MiraLax  everyday.  He is using a walker for ambulation.  He uses a wheelchair  sometimes at home as well too.   REVIEW OF SYSTEMS:  Notable for the above.  Skin has been intact.  He is  continent of bladder.  Full review is in the written health and history  section of the chart.   SOCIAL HISTORY:  The patient is married and living with his wife.  His  wife works days.   PHYSICAL EXAMINATION:  VITAL SIGNS:  Blood pressure is 129/56, pulse is  63, respiratory rate 18, and he is sating 98% on room air.  GENERAL:  The patient is pleasant, remains morbidly overweight.  Sitting  comfortable in the chair today.  He has bilateral knee braces on, which  were a low profile.  Strength is 3-4/5 at the hips, knees, and ankle,  plantar flexors.  He has 0/5 right ankle dorsiflexion and 1/5 left ankle  dorsiflexion today.  Sensory exam is decreased in both lower extremities  in a patchy distribution.  Reflexes are 1+ in both legs.  The patient  stood for me today and with the extra effort and time, was able to walk  from a sitting to standing position.  Once standing and  gaining balance,  was able to stand unsupported in a slightly flexed position.  He was  able to stand straight with cues.  When he walked, he tended to use the  knee to lift the foot in a steppage pattern and did some medial  circumduction of the leg as well.  Right knee is in severe recurvatum in  stance phase.  Left, he also had some recurvatum but not as severe.  The  patient is able to transfer back to a chair unassisted.   ASSESSMENT:  1. Lumbar stenosis with myelopathy, status post laminectomy.  2. Chronic bilateral foot drop more pronounced on the right.  This is      a 70-year-old injury.  3. Morbid obesity.   PLAN:  1. I recommended the use of the right KAFO, while he is up during the      day for exercise and for longer distances.  He is at a high fall      risk due to his weakness, poor form and size.  It is only a matter      of time before his current technique with gait and weightbearing      will exacerbate problems in his right knee.  I think he can benefit  from moving to outpatient therapy and more aggressively working on      strength, balance, appropriate technique, etc.  He might be a      candidate for E-Stim as well, although, at this point, he may be      too far out to truly benefit.  2. I recommend home exercise including stationary bicycle, which would      be good for him to increase his aerobic activity.  3. I recommend appropriate exercise and diet, which we have talked      about in the past.  4. I do not think he is appropriate to drive.  I think he would do      well with hand controls in the car, however.  I did support this      effort and he will likely make attempt to have that adaptation made      to his car.  5. I will see him back in about 2 months' time.  In general, [place] has      made a lot of progress over the last month or so.      Meredith Staggers, M.D.  Electronically Signed     ZTS/MedQ  D:  08/05/2008 13:34:08  T:   08/06/2008 08:06:09  Job #:  [id number]   cc:   Hosie Spangle, M.D.  Fax: [phone]

## 2011-02-02 NOTE — Consult Note (Signed)
[name], [name]                 ACCOUNT NO.:  [id number]   MEDICAL RECORD NO.:  [id number]          PATIENT TYPE:  INP   LOCATION:  3006                         FACILITY:  [facility]   PHYSICIAN:  Ophelia Charter, M.D.DATE OF BIRTH:  1940-11-21   DATE OF CONSULTATION:  06/08/2008  DATE OF DISCHARGE:                                 CONSULTATION   CHIEF COMPLAINT:  I cannot walk.   HISTORY OF PRESENT ILLNESS:  The patient is a 70 year old white male who  is a patient of Dr. Sherwood [name].  The patient has had previous posterior  neck surgery about 15 years ago.  Over the last few years, the patient  has had increasing back and leg pain.  He has seen Dr. Sherwood [name] a few  years ago with this and according to the patient, there was some  discussion of surgery.  The patient continued with nonsurgical  management, saw a chiropractor and basically lived with it.  Over the  last few months, the patient has been having increasing pain.  The  patient says that he had a knee surgery at [facility]. in March 2008.  After his surgery, he developed a right footdrop.  About a month ago, he  developed weakness of left foot, i.e. left footdrop.  He saw his primary  doctor Dr. Krista [name].  She worked him up with a lumbar MRI which was done at  [place] on June 03, 2008.  The patient has got to the point  where he has trouble ambulating, and he called our answering service  this morning and I told him that if he could not walk, he would need to  go to the emergency department.  He was sent to [facility] Emergency  Department where he was evaluated by Dr. Nira Retort, they tracked down on  his MRI scan from [facility] and a neurosurgery consultation was  requested.   Presently, the patient complains of back pain, bilateral leg pain with  weakness in his right greater than left foot.  He says his legs are not  strong enough to hold him up.  He is quite large.   PAST MEDICAL HISTORY:  Positive for  valvular heart disease, his  cardiologist is Dr. Pauline Aus, knee osteoarthritis.  Left should rotator  cuff injury.   PAST SURGICAL HISTORY:  The patient has had right knee surgery x3 and  left knee surgery.   MEDICATIONS PRIOR TO ADMISSION:  1. Percocet p.r.n.  2. Robaxin.  3. He takes iron.  His admission paperwork says he is on Coumadin, he      says he is not on Coumadin.   ALLERGIES:  He has no known drug allergies.   FAMILY HISTORY:  The patient's mother died at age 53, father in his 20s.   SOCIAL HISTORY:  The patient is married, has 3 children.  He is a  retired Administrator, he lives in [place].  He denies ethanol and  drug use.  He smokes a pipe.   REVIEW OF SYSTEMS:  Negative as stated above.   He denies hypertension, diabetes  mellitus, heart attack, stroke, cancer,  etc.   PHYSICAL EXAMINATION:  GENERAL:  A pleasant, morbidly-obese 70 year old  white male who has bilateral lower extremity edema.  HEENT:  Normocephalic and atraumatic.  His pupils equal, round and  reactive to light.  He is wearing glasses.  Extraocular muscles intact.  Oropharynx is benign.  NECK:  Supple.  There are no masses, deformities, or tracheal deviation.  He is quite obese.  He has well healed posterior incision, no signs of  infection.  THORAX:  Symmetric.  LUNGS:  Clear.  ABDOMEN:  Soft.  HEART:  Regular rhythm.  EXTREMITIES:  As above the patient has bilateral lower extremity edema.  BACK:  FABER testing and straight leg raise testing are negative  bilaterally.  NEUROLOGIC:  The patient is alert and oriented x3.  Cranial nerves II  through XII examined bilaterally, grossly normal.  Vision and hearing  are grossly normal bilaterally.  Motor strength is 5/5 in his bilateral  deltoid, biceps, triceps, and hand grip.  He has 4+/5 bilateral, psoas,  quadriceps, gastrocnemius strength.  His right EHL strength is  approximately 1-2/5, left is approximately 2-3/5.  Cerebellar exam is   intact and rapid alternating movements in upper extremities bilaterally.  Deep tendon reflexes are 104 at biceps, triceps, quadriceps, at his  gastrocnemius.  There is no ankle clonus.  Sensory function demonstrates  the increased light touch sensation in his bilateral feet.   IMAGING STUDIES:  I have reviewed the patient's lumbar MI performed on  June 03, 2008, without contrast at [facility].  The resolution  is poor but the sagittal images demonstrate multilevel disk degeneration  and spondylosis.  On axial images, the patient has moderate  multifactorial spinal stenosis at L1-L2 and L2-L3 and L3-L4 and L4-L5 he  has severe multifactorial spinal stenosis, L5-S1 bilateral facet  arthropathy but no significant spinal stenosis.   ASSESSMENT AND PLAN:  L1-L2, L2-L3, L3-L4, L4-L5 spinal stenosis,  neurogenic claudication, lumbago, and lumbar radiculopathy/myelopathy.  I have discussed the situation with the patient, his wife and daughter.  I told him that he has severe stenosis in his back and this is causing  neurogenic claudication.  He says at this point, he cannot walk and  cannot go home, so I will admit him to [facility] hospital for Dr.  Sherwood [name] and I will let him know he is here, and Dr. Sherwood [name] can  discuss the surgical option with him next week.  I have answered all the  patient's questions.      Ophelia Charter, M.D.  Electronically Signed     JDJ/MEDQ  D:  06/08/2008  T:  06/09/2008  Job:  [id number]

## 2011-02-02 NOTE — Op Note (Signed)
[name], [name]                 ACCOUNT NO.:  [id number]   MEDICAL RECORD NO.:  [id number]          PATIENT TYPE:  INP   LOCATION:  0009                         [facility]ACILITY[facility]  [facility]   PHYSICIAN:  Gaynelle Arabian, M.D.    DATE OF BIRTH:  26-Dec-1940   DATE OF PROCEDURE:  12/11/2007  DATE OF DISCHARGE:                               OPERATIVE REPORT   PREOPERATIVE DIAGNOSIS:  Unstable right total knee arthroplasty.   POSTOPERATIVE DIAGNOSIS:  Unstable right total knee arthroplasty.   PROCEDURE:  Right total knee arthroplasty revision.   SURGEON:  Gaynelle Arabian, M.D.   ASSISTANT:  Arlee Muslim PA-C   ANESTHESIA:  General with postop Marcaine pain pump.   ESTIMATED BLOOD LOSS:  About 200 mL.   DRAIN:  Hemovac times one.   TOURNIQUET TIME:  Up 67 at 350, down 8 minutes and up an additional 40  minutes at 350 mmHg.   COMPLICATIONS:  None.  Condition stable to recovery.   BRIEF CLINICAL NOTE:  [name] is a 70 year old male who had a right total  knee arthroplasty done at [facility] over a year ago.  He has had  progressive worsening pain and dysfunction with instability of the knee.  The knee will buckle and give out on him.  On exam he has gross AP  instability and flexion to the point where he can almost dislocate the  knee.  He has failed bracing and presents now for total knee  arthroplasty revision.   PROCEDURE IN DETAIL:  After the successful administration of general  anesthetic, a tourniquet was placed high on the right thigh, right lower  extremity prepped and draped in the usual sterile fashion.  The  extremity was wrapped in Esmarch, knee flexed and tourniquet inflated  350 mmHg.  Midline incision was made with a 10 blade through  subcutaneous tissue to the level of the extensor mechanism.  Fresh blade  is used to make a medial parapatellar arthrotomy.  Fluid is encountered  and appears clean.  We did send it for stat Gram stain C&S which was  negative.  The soft  tissue of the proximal medial tibia subperiosteally  elevated to the joint line with the knife and into the semimembranosus  bursa with a Cobb elevator.  Soft tissue laterally is elevated with  attention being paid to avoiding patellar tendon on tibial tubercle.  I  was able to evert the patella, flexed the knee to 90 degrees and when I  flexed it, the knee dislocated.  He was grossly unstable  posterolaterally.  I subluxed the leg forward and dissected the tibial  polyethylene from the tibial cone and removed the polyethylene  component.  We then disrupted the interface between the femoral  component and cement and easily removed that, subsequently removing the  cement with minimal if any bone loss.  The tibial side, we disrupted the  interface between the component and bone and was able to remove that  again with minimal bone loss.  There was a tiny segment posterior  medially that a small crack and I fixed that  with a K-wire.  We then  thoroughly irrigated both canals and reamed both canals up to 20 mm on  the femoral side, 13 mm on tibial side.  Reamers left in place and  tibial side.  A intramedullary cutting guide is placed.  We did have to  resect about a millimeter or two off the tibial surface.  The tibial  resection is made with an oscillating saw.  Size 5 is most appropriate  tibial component.  We put the size 5 trial baseplate and then did the  proximal reaming through there with a 13 x 60 stem extension.  We then  used the a keel punch.  Looking at his preop x-rays, his resection level  was at or slightly below the fibular head.  Thus we needed to build  this.  He had a 17.5-mm insert to start and I decided that we would need  to put a 10-mm medial and lateral augment on the tibial side in order to  build up to a more normal level.  We then proceeded with the femur  placing the 20 mm reamer in there for our intramedullary alignment.  The  5 degrees right valgus alignment  cutting guide is placed and we used  this to resect minimal bone off.  I went to the +4 position to get to  decent bone, thus using 4 mm distal medial and lateral augments.  A 10  mm spacer block was placed, knee held to 90 degrees and a size 5 AP  block is placed.  We did rotation based on the spacer block to get a  rectangular flexion gap.  The 5 block is placed in the +2 position  effectively raising the stem so we would be on the anterior cortex of  the femur with the cut.  The anterior-posterior cuts were made.  The  revision block was placed to make the chamfer and intercondylar cuts  with the TC3 component.   Trial prosthesis placed on the tibial side,  13 x 60 stem extension,  size 5 MBT revision tibia with the 10-mm medial lateral augments.  On  the femoral side we had a size 5 PC3 femur with a 20 x 75 stem extension  in a +2 position and 4 mm medial and lateral distal augments.  The  components were placed.  We went up to a 22.5 spacer in order to get  excellent stability, both varus valgus anterior and posterior stability  in full extension through full range of motion.  I then everted the  patella and removed the LCS patellar component.  The composite thickness  prior to removing that was 30 mm.  I wanted to get down to 25 mm total.  I resected the tibia down to 14 mm then placed a 41 template, drilled  the lug holes and placed the 41 trial patella for composite thickness of  about 25 mm.  Patella tracked normally.  We then released the tourniquet  for the initial tourniquet time of 67 minutes.  Moist sponge is placed.  We inspected and there is minimal bleeding.  While the tourniquet was  down the components were assembled on the back table.  We used the same  size components that we did with the trials.  Once the tourniquet was  down for 8 minutes and I rewrapped the leg in Esmarch and reinflated  tourniquet to 350 mmHg.  We then sized for the cement restrictor and  size 4  is the most appropriate restrictor.  It is then placed the  appropriate depth in the tibial canal.  The cut bone surfaces were then  thoroughly irrigated with pulsatile lavage and cement was mixed.  Once  ready for implantation it is injected into the tibial canal and then the  tibial component cemented and impacted with all extruded cement removed.  On the femoral side we cemented distally and press fit the stem.  This  was also impacted with extruded cement removed.  The 41 patella was also  cemented into place and held with the clamp.  Trial 22.5 insert was  placed, knee held in full extension and all extruded cement removed.  Once the cement hardened, we went through a range of motion.  Again  there was great stability, AP and varus-valgus throughout full range of  motion.  This was a marked improvement from his initial status.  The  trial inserts removed and the permanent 22.5 mm TC3 RP insert is placed.  Wound is then copiously irrigated with saline solution and extensor  mechanism closed over Hemovac drain with interrupted #1 PDS.  Flexion  against gravity was about 120-125 degrees at which point the calf hit  the posterior thigh.  The patella tracked normally.  Tourniquet was  released for second time of 40 minutes.  Once the tourniquet was down,  minimal bleeding is encountered in the subcu and that stopped with  cautery.  Subcu was closed with interrupted 2-0 Vicryl and skin with  staples.  Catheter for Marcaine pain pump is placed and the pump was  initiated.  The drain was hooked to suction and a bulky sterile dressing  applied.  He is placed into a knee immobilizer, awakened and transferred  to recovery in stable condition.      Gaynelle Arabian, M.D.  Electronically Signed     FA/MEDQ  D:  12/11/2007  T:  12/12/2007  Job:  [id number]

## 2011-02-02 NOTE — Procedures (Signed)
LOWER EXTREMITY ARTERIAL EVALUATION-SINGLE LEVEL   INDICATION:  Possible vascular disease.   HISTORY:  Diabetes:  No.  Cardiac:  No.  Hypertension:  No.  Smoking:  No.  Previous Surgery:  History of knee surgeries.   RESTING SYSTOLIC PRESSURES: (ABI)                          RIGHT                LEFT  Brachial:               142                  140  Anterior tibial:        168 (1.18)           178 (1.25)  Posterior tibial:       164                  176  Peroneal:  DOPPLER WAVEFORM ANALYSIS:  Anterior tibial:        Triphasic            Triphasic  Posterior tibial:       Triphasic            Triphasic  Peroneal:   PREVIOUS ABI'S:  Date:  RIGHT:  LEFT:   IMPRESSION:  Normal bilateral ankle brachial indices with triphasic  Doppler waveforms noted.   ___________________________________________  Nelda Severe Kellie Simmering, M.D.   CH/MEDQ  D:  05/20/2008  T:  05/20/2008  Job:  [id number]

## 2011-02-02 NOTE — H&P (Signed)
[name], [name]                 ACCOUNT NO.:  [id number]   MEDICAL RECORD NO.:  [id number]          PATIENT TYPE:  INP   LOCATION:  NA                           [facility]ACILITY:  [facility]   PHYSICIAN:  Gaynelle Arabian, M.D.    DATE OF BIRTH:  Aug 14, 1941   DATE OF ADMISSION:  12/11/2007  DATE OF DISCHARGE:                              HISTORY & PHYSICAL   CHIEF COMPLAINT:  Right knee pain.   HISTORY OF PRESENT ILLNESS:  The patient is a 70 year old male who has  been seen by Dr. Wynelle Link for ongoing problems with a right total knee.  He was seen in second opinion and has had problems since this past March  when he had his knee replaced.  He ended up developing a foot drop  postoperatively.  He did require transfusion during his surgery.  He has  undergone large workup at [facility] with regards to his foot drop.  Unfortunately he has continued to develop significant problems with the  knee.  When he goes to straighten the leg and bend it he will hear a  loud pop and having some instability problems.  He was seen by Dr.  Wynelle Link and found to have a mechanical shifting with a loud pop and  actually when the patient is sitting with the knee flexed at 90 degrees  he can actually physically dislocate the knee anterior posterior.  It is  felt that this patient has a grossly unstable knee and the popping we  feel like is coming from a dislocation/relocation maneuver.  He has  undergone nerve conduction studies before surgery and found to have an  axonal degeneration throughout the right lower extremity as opposed to  just be the distal part of the leg.  Does not feel to be consistent with  a peroneal nerve denervation at the fibular head.  He is noted to have a  foot drop.  Due to the significant issues it was felt he would best be  served by undergoing a revision arthroplasty.  The risks and benefits of  this procedure have been discussed with the patient and he elects to  proceed with surgery.   ALLERGIES:  No known drug allergies.   CURRENT MEDICATIONS:  Diclofenac, Lipitor, vitamin D, aspirin and  Centrum Silver.   PAST MEDICAL HISTORY:  Sleep apnea, hypercholesterolemia, heart murmur  secondary to a moderate to severe aortic stenosis (the patient states  this is likely from his diet medications of phen-fen for over a year),  arthritis, degenerative disk disease, history of gout and also the  significant right foot drop following his knee replacement.   PAST SURGICAL HISTORY:  Right total knee March of 2008, left knee  surgery about 4 years ago and he has also had spine surgery about 14  years ago.   FAMILY HISTORY:  Father deceased age 14.  Mother deceased age 69.   SOCIAL HISTORY:  Married, works as a Administrator, current pipe smoker.  No alcohol.  Three children.  Family will be assisting with care after  surgery.   REVIEW OF SYSTEMS:  GENERAL:  No fevers, chills or night sweats.  NEURO:  No seizures, syncope or paralysis.  RESPIRATORY: No shortness breath,  productive cough or hemoptysis.  CARDIOVASCULAR:  No chest pain, angina,  orthopnea.  GI:  No nausea, vomiting, diarrhea or constipation.  GU:  No  dysuria, hematuria or discharge.  MUSCULOSKELETAL:  Pertinent for that  of the right leg, also with a foot drop.  He also has some muscular back  pain.   PHYSICAL EXAMINATION:  VITAL SIGNS:  Pulse 68, respirations 14, blood  pressure 138/72.  GENERAL:  A 70 year old white male well-nourished, well-developed, in no  acute distress, morbidly obese, accompanied by his wife.  HEENT:  Normocephalic, atraumatic.  Pupils are round and reactive.  EOMs  intact.  NECK:  Supple with bilateral bruits likely referred from the aortic  stenosis.  CHEST:  Clear anterior and posterior chest walls without rhonchi, rales  or wheezing.  HEART:  Regular rhythm with a crescendo-decrescendo systolic ejection  murmur graded 3/6.  ABDOMEN:  Protuberant abdomen.  Bowel sounds present.   Soft to  palpation.  RECTAL:  Not done, not pertinent to present illness.  BREASTS:  Not done, not pertinent to present illness.  GENITALIA:  Not done, not pertinent to present illness.  EXTREMITIES:  Right knee, he does have a moderate effusion noted.  He  has an audible pop from extension to a flexed position of the knee  shifting on motion.  There is an anterior shifting with the knee flexed  at 90 degrees essentially dislocating the knee anterior posterior.  Passive range of motion zero to 110.   IMPRESSION:  1. Grossly unstable right total knee.  2. Moderate to severe aortic stenosis with cardiac murmur.  3. History of diet medication phen-fen.  4. Hypercholesterolemia.  5. Degenerative disk disease.  6. History of gout.  7. Right foot drop preexisting from previous surgery.   PLAN:  The patient was admitted to [facility] to undergo a  revision arthroplasty of the right total knee.  Surgery will be  performed by Dr. Gaynelle Arabian.      Alexzandrew L. Perkins, P.A.C.      Gaynelle Arabian, M.D.  Electronically Signed    ALP/MEDQ  D:  12/06/2007  T:  12/06/2007  Job:  [id number]   cc:   Gaynelle Arabian, M.D.  Fax: [phone]   Lindalou Hose, NP

## 2011-02-02 NOTE — H&P (Signed)
[name], [name]                 ACCOUNT NO.:  [id number]   MEDICAL RECORD NO.:  [id number]          PATIENT TYPE:  INP   LOCATION:  [place]                         FACILITY:  [facility]   PHYSICIAN:  Gaynelle Arabian, M.D.    DATE OF BIRTH:  1941/07/10   DATE OF ADMISSION:  01/27/2009  DATE OF DISCHARGE:                              HISTORY & PHYSICAL   CHIEF COMPLAINT:  Unstable right total knee.   HISTORY OF PRESENT ILLNESS:  The patient is a 70 year old male well-  known to Dr. Gaynelle Arabian having previously undergone a right total  knee back in March 2009.  Unfortunately developed some instability after  an accident and fall where he tripped 4 or 5 months ago.  He has  developed some instability and some hyperextension with some laxity  within the knee.  He has tried bracing and initially did okay, but  continues to have problems.  It is felt he would benefit from undergoing  revision knee and possible poly swap, possible revision of the  components.  Risks and benefits have been discussed.  He elects to  proceed with surgery.   ALLERGIES:  No known drug allergies.   CURRENT MEDICATIONS:  Diclofenac, Lipitor, vitamin D, Centrum Silver,  Senokot, MiraLax.   PAST MEDICAL HISTORY:  1. Sleep apnea.  2. Hypercholesterolemia.  3. Moderate to severe aortic stenosis with a cardiac murmur.  4. Arthritis.  5. Degenerative disk disease with lumbar stenosis.  6. History of gout.  7. History of bursitis.  8. 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   PAST SURGICAL HISTORY:  Right total knee March 2008.  He had a left knee  surgery about 5 years ago.  Had spine surgery.  He has undergone I and D  for hematoma of the right knee.   FAMILY HISTORY:  Father deceased at age 75.  Mother deceased at age 70.   SOCIAL HISTORY:  Married, truck Geophysicist/field seismologist.  No alcohol.  Three children.  Family will be assisting with care after surgery.   REVIEW OF SYSTEMS:  GENERAL:  No fevers, chills  or night sweats.  NEUROLOGIC:  No seizures, syncope or paralysis.  RESPIRATORY:  No  shortness of breath, productive cough or hemoptysis.  CARDIOVASCULAR:  No chest pain, angina or orthopnea.  GI:  No nausea, vomiting, diarrhea  or constipation.  GU:  No dysuria, hematuria or discharge.  MUSCULOSKELETAL:  Right knee.   PHYSICAL EXAMINATION:  VITAL SIGNS:  Pulse 76, respirations 14, blood  pressure 136/72.  GENERAL:  A 70 year old white male well-nourished, well-developed,  morbidly obese.  He is alert, oriented, and cooperative.  Accompanied by  his wife.  HEENT:  Normocephalic, atraumatic.  Pupils are round and reactive.  EOMs  intact.  NECK:  Supple.  He does have some slight bilateral carotid bruises.  This may be referred from thoracic cavity.  CHEST:  Clear anterior posterior chest walls.  No rhonchi, rales or  wheezing.  HEART:  Regular rate and rhythm with a grade 3/6 systolic  ejection murmur best heard over aortic point, S1, S2  noted.  ABDOMEN:  Soft, round protuberant abdomen, bowel sounds present, RECTAL,  BREASTS, GENITALIA:  Not done and not pertinent to present illness.  EXTREMITIES:  Right knee.  The knee does hyperextend about 7 degrees.  Able to flex it up to about 125-130.  He has some instability and some  laxity in flexion.  Motor intact.   IMPRESSION:  Unstable right total knee.   PLAN:  The patient admitted to [facility] to undergo a  revision total knee versus poly swap.  Surgery is to be performed by Dr.  Gaynelle Arabian.      Alexzandrew L. Perkins, P.A.C.      Gaynelle Arabian, M.D.  Electronically Signed    ALP/MEDQ  D:  01/28/2009  T:  01/28/2009  Job:  [id number]

## 2011-02-02 NOTE — Consult Note (Signed)
[name], [name]                 ACCOUNT NO.:  [id number]   MEDICAL RECORD NO.:  [id number]          PATIENT TYPE:  INP   LOCATION:  NA                           FACILITY:  [facility]   PHYSICIAN:  Wallie Char, MD    DATE OF BIRTH:  12-May-1941   DATE OF CONSULTATION:  06/08/2008  DATE OF DISCHARGE:  12/22/2007                                 CONSULTATION   REFERRING PHYSICIAN:  [facility] ER physician.   REASON FOR CONSULTATION:  Progressive weakness and gait difficulty.   This is a 70 year old man, who presents with a history of progressive  difficulty with his gait, particularly over the past week or so.  He has  chronic low back problems as well as history of having to walk with a  walker.  In addition, he has symptoms consistent with lumbar  claudication with pain and burning sensation in his lower back going  down his legs on standing.  The patient had an MRI on June 03, 2008, which showed severe lumbar stenosis at multiple levels including  L2-3, 3-4, and 4-5 as well as moderate stenosis at L5-S1.  Stenosis was  associated with spondylitic changes as well as disk bulges and trusions.  Patient's weakness of his lower extremities worsened yesterday to the  point that he was unable to get up from the floor after falling.  He had  a repeat episode this morning.  His distance with ambulation has become  increasingly shorter.  He has a history of cervical decompression about  15 years ago secondary to a schwannoma.  There has been no recurrence.  There is no indication of a mass lesion affecting his lumbar spine.  He  had pelvic MRI as well on June 03, 2008, which did not show  significant lumbar plexus lesion on either side.  He has known right  foot drop associated with previous surgical intervention involving his  knee.  He has not lost sensation in his lower extremities.  There has  been no change in bowel or bladder control.   PAST MEDICAL HISTORY:  In  addition to arthritis and degenerative changes  involving his spine includes:  1. Total knee replacement on the right with formation of a hematoma      postoperative that had to be evacuated.  2. Mitral valve prolapse.  3. Aortic stenosis.  4. Hypercholesterolemia.  5. Gout.   CURRENT MEDICATIONS:  1. Aspirin 81 mg per day.  2. Multivitamin 1 per day.  3. Diclofenac 75 mg b.i.d.  4. Lipitor 40 mg per day.   FAMILY HISTORY:  Noncontributory.   PHYSICAL EXAMINATION:  Appearance was that of a markedly obese, elderly-  appearing man, who was alert and cooperative and in no acute distress  lying supine on a gurney in the ER.  He was well-oriented to time as  well as place.  Short term and long term memory was normal.  Affect was  appropriate.  Pupils were equal and reacted normally to light.  Extraocular movements and visual fields were intact and normal.  There  was  no facial weakness.  Hearing was normal.  Speech and palate movement  were normal.  Strength of his upper extremities was normal both  proximally and distally.  He had moderate proximal weakness particularly  involving his hip flexors on the right as well as absent dorsiflexion of  his right foot and mild plantar flexion weakness.  Strength of his left  lower extremity was normal except for mild hip flexor weakness.  Muscle  tone was flaccid throughout.  Deep tendon reflexes were trace to 1+ in  the upper extremities and absent in the knees and ankles.  Plantar  responses were mute.  Sensory exam was normal.  Prognostication revealed  a transmitted systolic murmur to the left common carotid.   CLINICAL IMPRESSION:  1. Severe spinal stenosis the most likely etiology for progressive      gait difficulty and loss of ambulatory ability over the past 2      days.  2. Old right foot drop.   RECOMMENDATIONS:  Given the severity of his spinal stenosis and  acuteness of development of weakness and ambulatory difficulty, I am   recommending neurosurgical evaluation.  The patient may well benefit  from a course of steroids intravenously as well.  We will leave that to  Neurosurgery to decide.   Thank you for asking me to evaluate Mr. Fryman.      Wallie Char, MD  Electronically Signed     CS/MEDQ  D:  06/08/2008  T:  06/08/2008  Job:  [id number]

## 2011-02-02 NOTE — Discharge Summary (Signed)
[name], [name]                 ACCOUNT NO.:  [id number]   MEDICAL RECORD NO.:  [id number]          PATIENT TYPE:  INP   LOCATION:  1620                         FACILITY:  [facility]   PHYSICIAN:  Gaynelle Arabian, M.D.    DATE OF BIRTH:  1941-02-09   DATE OF ADMISSION:  01/27/2009  DATE OF DISCHARGE:  01/30/2009                               DISCHARGE SUMMARY   ADMISSION DIAGNOSES:  1. Unstable right total knee.  2. Sleep apnea.  3. Hypercholesterolemia.  4. Moderate-severe aortic stenosis with cardiac murmur.  5. Arthritis.  6. Degenerative disk disease with lumbar stenosis.  7. History of gout.  8. History of bursitis.  9. Significant right foot drop secondary to peroneal palsy,      preexisting.   DISCHARGE DIAGNOSES:  1. Unstable right total knee arthroplasty status post right total knee      revision polyethylene exchange.  2. Sleep apnea.  3. Hypercholesterolemia.  4. Moderate-severe aortic stenosis with cardiac murmur.  5. Arthritis.  6. Degenerative disk disease with lumbar stenosis.  7. History of gout.  8. History of bursitis.  9. Significant right foot drop secondary to peroneal palsy,      preexisting.   PROCEDURE:  Jan 27, 2009, revision right total knee with polyethylene  exchange.   SURGEON:  Gaynelle Arabian, M.D.   ASSISTANT:  Alexzandrew L. Perkins, P.A.C.   ANESTHESIA:  General.   TOURNIQUET TIME:  34 minutes.   CONSULTS:  None.   BRIEF HISTORY:  [name] is a 70 year old male who underwent a right total  knee revision back in March 2009 for instability.  Initially, he did  well and then had an accident and fell about 4-5 months ago.  He has  developed instability.  He did okay with bracing, but his instability  has progressed.  He has hyperextension and flexion laxity which he did  not have prior to his injury.  It is felt he would benefit undergoing  revision.  Risks and benefits discussed.  The patient is admitted to the  hospital.   LABORATORY  DATA:  Preop CBC:  Hemoglobin 12.5, hematocrit 37.4, white  cell count 7.2, platelets 165,  PT/INR 13.6 and 1.0, PTT of 30.  Chem  panel on admission all within normal limits.  Preop UA showed positive  nitrites, rare squamous, only 0-2 white cells.  Serial CBCs were  followed.  Hemoglobin dropped down to 11.8, then 11.6.  Last H and H  11.1 and 33.2.  Serial pro-times followed.  Last PT/INR 14.5 and 1.1.  Serial BMETs were followed postop.  Electrolytes remained within normal  limits.   DIAGNOSTICS:  1. Chest x-ray June 09, 2008:  Cardiomegaly.  2. EKG January 17, 2009:  Sinus rhythm with first-degree AV block,      incomplete.  No significant change since last tracing performed by      Dr. Daryel November.   HOSPITAL COURSE:  The patient was admitted to [facility],  taken to OR, underwent the above stated procedure without complication.  The patient tolerated the procedure well, later transferred to  the  recovery room on the orthopedic floor.  Started on PCA and p.o.  analgesic pain control following surgery on 24 hours postop IV  antibiotics.  Hemoglobin looked stable.  Family brought in his CPAP  machine because of his sleep apnea.  Started back on his home meds.  Actually, he did pretty well and got up and walked about 50-60 feet on  day 1.  Day 2, we changed dressing and incision looked good.  Had very  minimal bruising and drainage especially with his prior history of  having hematomas after his previous surgery.  Knee looked good.  Hemoglobins remained stable.  Electrolytes looked good.  Continued to  progress well with physical therapy walking over 120 feet.  He was seen  on rounds on the morning by day 3, tolerating meds and discharged home.   DISPOSITION:  The patient discharged home on Jan 30, 2009.   DISCHARGE MEDICATIONS:  1. Percocet.  2. Robaxin.  3. He is also encouraged to take 1 full dose enteric-coated aspirin      twice a day.   DIET:  Low-cholesterol  diet, heart-healthy diet.   ACTIVITY:  He is weightbearing as tolerated total knee protocol.  Home  health PT and home health nursing.   FOLLOW UP:  Follow up next Thursday, Feb 06, 2009 for wound check and  staple removal.   DISPOSITION:  Home.   CONDITION ON DISCHARGE:  Improving.      Alexzandrew L. Perkins, P.A.C.      Gaynelle Arabian, M.D.  Electronically Signed    ALP/MEDQ  D:  01/30/2009  T:  01/30/2009  Job:  [id number]   cc:   Gaynelle Arabian, M.D.  Fax: [phone]

## 2011-02-02 NOTE — Op Note (Signed)
[name], [name]                 ACCOUNT NO.:  [id number]   MEDICAL RECORD NO.:  [id number]          PATIENT TYPE:  INP   LOCATION:  0010                         FACILITY:  [facility]   PHYSICIAN:  Gaynelle Arabian, M.D.    DATE OF BIRTH:  [birth year]   DATE OF PROCEDURE:  01/27/2009  DATE OF DISCHARGE:                               OPERATIVE REPORT   PREOPERATIVE DIAGNOSIS:  Unstable right total knee arthroplasty.   POSTOPERATIVE DIAGNOSIS:  Unstable right total knee arthroplasty.   PROCEDURE:  Right total knee arthroplasty, polyethylene revision.   SURGEON:  Gaynelle Arabian, M.D.   ASSISTANT:  Alexzandrew L. Perkins, P.A.C.   ANESTHESIA:  General.   ESTIMATED BLOOD LOSS:  Minimal.   DRAINS:  None.   TOURNIQUET TIME:  34 minutes at 300 mmHg.   COMPLICATIONS:  None.   CONDITION:  Stable to recovery.   BRIEF CLINICAL NOTE:  [name] is a 70 year old male who underwent a right  total knee arthroplasty revision in March, 2009 for instability.  He did  extremely well initially, but then had a accident in  which he tripped  and fell approximately 4-5 months ago.  He developed instability in the  knee.  Initially, he did okay with a brace, but the instability has  progressed.  He hyperextends and has flexion laxity.  This was not  present prior to his injury.  Given that he is globally unstable we felt  that this potentially could be treated with a thicker polyethylene as  opposed to having to do a formalized revision to a hinged implant.  He  presents now for revision of the polyethylene versus total knee  revision.   PROCEDURE IN DETAIL:  After successful administration of general  anesthetic a tourniquet was placed high on his right thigh and his right  lower extremity was prepped and draped in the usual sterile fashion.  The extremity was wrapped in Esmarch, knee flexed and tourniquet  inflated to 300 mmHg.  The previous incision was reutilized.  The skin  cut with a 10 blade  through subcutaneous tissue to the level of the  extensor mechanism.  A fresh blade was used to make a medial  parapatellar arthrotomy.  The soft tissue on the proximal medial tibia  was subperiosteally elevated to the joint line with the knife and into  the semimembranosus bursa with a Cobb elevator.  The soft tissue  laterally was elevated with attention being paid to avoid the patellar  tendon on the tibial tubercle.  The tissue had a healthy appearance with  no signs of any significant synovitis.  There was no evidence of any  infectious appearing fluid.  I was able to sublux the tibia forward,  essentially dislocating it and I was easily remove the 22.5 mm thickness  PC3 polyethylene.  We then placed a trial 25 mm.  He still had some  hyperextension with that.  The next size trial was a 30.  With the 30  there was full extension with excellent varus, valgus and AP stability  throughout full range of motion.  It  was felt that this would  effectively solve his problem.  We then placed the permanent 30 mm PC3  RP insert into the tibial tray.  Please note that both the tibial and  femoral components were found to be stable and well fixed.  We reduced  the knee and there was excellent stability to full extension with  excellent varus-valgus and the anterior and posterior balance throughout  full range of motion.  We then thoroughly irrigated the knee and closed  the arthrotomy over a Hemovac drain with interrupted #1 PDS.  Flexion  against gravity was about 120 degrees.  The tourniquet was then released  for a total time of about 34 minutes.  The subcutaneous was then closed  with interrupted 2-0 Vicryl and skin with staples.  The drain was hooked  to suction.  The incision was cleaned and dried and a bulky sterile  dressing applied.  He was then awakened and transported to the recovery  in stable condition.      Gaynelle Arabian, M.D.  Electronically Signed     FA/MEDQ  D:   01/27/2009  T:  01/27/2009  Job:  [id number]

## 2011-02-02 NOTE — H&P (Signed)
[name], [name]                 ACCOUNT NO.:  [id number]   MEDICAL RECORD NO.:  [id number]          PATIENT TYPE:  IPS   LOCATION:  [id number]                         FACILITY:  [facility]   PHYSICIAN:  Meredith Staggers, M.D.DATE OF BIRTH:  05/07/1941   DATE OF ADMISSION:  06/14/2008  DATE OF DISCHARGE:                              HISTORY & PHYSICAL   CHIEF COMPLAINTS:  Back and leg pain.   HISTORY OF PRESENT ILLNESS:  This is an obese 70 year old male with  polyneuropathy admitted on June 08, 2008, after a 2-day history of  fall with increasing lower extremity weakness.  X-rays revealed severe  degenerative changes in the lumbar spine with stenosis.  Neurosurgery  was consulted and the patient would like to undergo a L1-L5  decompressive laminectomy by Dr. Sherwood Gambler, which was performed on  June 12, 2008.  The patient has been followed by physical therapy  and continues to need substantial assistance with mobility and self-care  and thus he was admitted to the inpatient rehab unit today.   REVIEW OF SYSTEMS:  Notable for weakness, numbness, low back pain, wound  drainage, nocturia, and chronic edema.  Other pertinent positives are  above.  Full reviews in the written health and history section.   PAST MEDICAL HISTORY:  1. Positive for obstructive sleep apnea on CPAP.  2. Morbid obesity.  3. Gout.  4. Moderate severe AS.  5. Bilateral total knee replacements with residual peroneal neuropathy      from the right knee replacement.  6. Sensory-motor polyneuropathy.  7. History of schwannomas.  8. Bilateral shoulder arthroplasty.  9. C6-T2 laminectomy in 1993.  10.Left shin ulcer.   FAMILY HISTORY:  Negative.   SOCIAL HISTORY:  The patient is married, lives in [place] in the one-  level house with two to three steps to enter.  Retired and is a disabled  Administrator.  He smokes two pipes a week.  He does not drink.  He has  wife and sons who can provide some  intermittent help.   FUNCTIONAL HISTORY:  The patient independent with walker up until  recently, but essentially was homebound.  He dragged his right foot and  walked in a compensatory fashion.  Currently, the patient is set up for  upper extremity care, total assist for lower body care, max total for  transfers.  He is able to ambulate 75 feet total assistance with rolling  walker.   ALLERGIES:  None.   MEDICATIONS:  1. Aspirin 81 mg daily.  2. Multivitamin daily.  3. Diclofenac 75 mg b.i.d.  4. Lipitor 40 mg daily.  5. Vitamin D.   LABS:  Hemoglobin 11.6, white count 6.6, and platelets 157.  Sodium 141,  potassium 4.0, BUN and creatinine 14 and 0.9.   PHYSICAL EXAMINATION:  VITAL SIGNS:  Blood pressure 141/73, pulse 73,  respiratory rate 20, temperature 97.5.  GENERAL:  The patient is pleasant sitting in bed.  He is morbidly obese.  HEENT:  Pupils equal, round, and reactive to light.  Nose and throat  exam is unremarkable with fair dentition  and mucosa.  NECK:  Supple without JVD or lymphadenopathy.  RESPIRATORY:  Unremarkable with lungs clear to auscultation.  CARDIAC:  Heart is notable for systolic murmur, but otherwise no rubs or  gallops were appreciated.  EXTREMITIES:  2+ pitting edema, left more than right with chronic  vascular changes.  ABDOMEN:  Soft, nontender.  Bowel sounds are positive.  SKIN:  A back wound which had some serosanguineous discharge, but  otherwise well approximated.  He had some reddened areas around the  groin and the gluteal folds and belly folds.  NEUROLOGIC:  Cranial nerves II through XII are intact.  The reflexes 1+  throughout.  Sensation is decreased in all four limbs particularly the  lower limbs below the knees.  Right foot is notable for 0/5 ankle  dorsiflexion.  Plantar flexion was 4/5.  Proximally in the right lower  extremities 2/5 in the knee and hip.  Left lower extremities 2/5 to 4+/5  proximal and distal.  Upper extremities were  5/5.  The patient's  judgment, orientation, memory, and mood were all within functional  limits.   ASSESSMENT/PLAN:  1. Functional deficits secondary to L1-L5 stenosis and neurogenic      claudication status post laminectomy.  The patient is admitted to      the inpatient rehab unit to receive collaborative interdisciplinary      care between the physiatrist, rehab nursing staff, and therapy      team.  The patient's level of medical complexity and substantial      therapy needs in context of that medical necessity cannot be      provided in a lesser intensity of care.  Physiatrist will provide      24-hour management of medical needs listed below as well as      oversight of the therapy plan and provide guidance as appropriate      regarding interaction of the two.  24-hour rehab nursing will      assist in management of the patient's bowel and bladder continence,      skin care need including his back in areas of contact rash and      candida.  Also need assistance with pain management, appropriate      nutrition, etc.  PT will assess and treat for transfers and gait.      He likely will need an AFO perhaps the dorsiflexion assist type to      assist with gait.  OT will assess and treat for upper extremity      use, hygiene, appropriate adaptive equipment, and family education.      Rehab case manager/social worker will assess for psychosocial needs      and discharge planning.  The patient will receive 3 hours of      therapy a day at least 5 days a week.  Team conferences will be      held weekly to establish goals, assess progress, and to determine      barriers to discharge.  Estimated length of stay is 2 weeks.  Goals      supervision to modified independent.  He may need mini assist with      a few ADLs.  Prognosis is fair to good.  2. Anemia:  Add iron supplement and monitor hemoglobin serially.  3. Obstructive sleep apnea:  Continue CPAP at bedtime.  4. Right footdrop:  We  will order an orthotic consult on admission to      assess for dorsiflexion  assist AFO.  5. Gout:  Resume the patient's NSAIDs including Celebrex.  6. Wound care:  Continue dry dressing to back and nystatin powder to      skin folds.  7. Deep vein thrombosis prophylaxis:  PAS hose and TEDs.  Hold off on      Lovenox due to a history of bleeding with Lovenox postoperatively.      Meredith Staggers, M.D.  Electronically Signed     ZTS/MEDQ  D:  06/14/2008  T:  06/15/2008  Job:  [id number]

## 2011-02-02 NOTE — Op Note (Signed)
[name], [name]                 ACCOUNT NO.:  [id number]   MEDICAL RECORD NO.:  [id number]          PATIENT TYPE:  INP   LOCATION:  3102                         FACILITY:  [facility]   PHYSICIAN:  Hosie Spangle, M.D.DATE OF BIRTH:  04-25-1941   DATE OF PROCEDURE:  06/12/2008  DATE OF DISCHARGE:                               OPERATIVE REPORT   PREOPERATIVE DIAGNOSES:  1. Lumbar stenosis.  2. Lumbar spondylosis.  3. Lumbar degenerative disk disease.  4. Neurogenic claudication.  5. Paraparesis.   POSTOPERATIVE DIAGNOSES:  1. Lumbar stenosis.  2. Lumbar spondylosis.  3. Lumbar degenerative disk disease.  4. Neurogenic claudication.  5. Paraparesis.   PROCEDURE:  L2-L5 decompressive lumbar laminectomy with microdissection  with decompression of the L2, L3, L4, and L5 nerve roots bilaterally.   SURGEON:  Hosie Spangle, MD   ASSISTANT:  Isa Rankin. Judeth Horn, RN.   ANESTHESIA:  General endotracheal.   INDICATION:  The patient is a 70 year old man who has morbid obesity and  has developed progressive weakness in the lower extremities with his  lower extremities giving way several times.  He was evaluated by the  Neurology service.  Electrodiagnostic study showed significant motor and  sensory polyneuropathy.  MRI was obtained a week and half ago and  revealed multilevel multifactorial lumbar stenosis severe at the L3-L4  and L4-L5 levels more moderate at L2-L3 and L1-L2.  A decision was made  to proceed with decompressive lumbar laminectomy.   PROCEDURE:  The patient was brought to the operating room and placed  under general endotracheal anesthesia.  The patient was turned to a  prone position.  The lumbar region was prepped with Betadine soap and  solution and draped in a sterile fashion.  The midline was infiltrated  with local anesthetic with epinephrine and then a midline incision was  made in the lumbar region and carried down to the subcutaneous tissue.  Bipolar  electrocautery was used to maintain hemostasis.  Dissection was  carried down to the lumbar fascia, which was incised bilaterally.  The  paraspinal muscles were dissected from the spinous process of the lamina  in the subperiosteal fashion.  A self-retaining retractor was placed and  an x-ray was taken and the L1-L2, L2-L3, L3-L4, and L4-L5 intralaminar  space was identified.   Then with magnification and microdissection and microsurgical technique,  the decompression was performed.  Laminectomy was begun with removal of  the spinous process using double action rongeurs and the X-Max drill.  We then continued the laminectomy using the X-Max drill and Kerrison  punches.  Notably, the bone was quite thickened and had essentially  ankylosed across the lamina and intralaminar spaces such that there was  essentially a nearly complete sheet of bone posteriorly in relationship  to the spinal canal.  Therefore, the dissection was done slowly and  carefully.  We were able to gradually dissect down to the level of  spinal canal.  We began to identify the ligamentum flavum at the  location where the intralaminar space would have been and then we  gradually performed the  laminectomy, thinning the bone down to a thin  shell and then removing it with a 2-mm and 2.5 mm Kerrison punch.  Care  was taken to leave the underlying thecal sac undisturbed.   The most severed stenosis was found at L4-L5, next more so at L3-L4, and  only moderate stenosis found at L2-L3.  It was felt that it was not  necessary to decompress the L1-L2 level because the stenosis there was  even less prominent than at L2-L3.  At the L4-L5 level, the stenosis was  most severe.  The bone and ligament tissue were thickened and causing  significant compression of the thecal sac and nerve roots.  Some of the  thickened ligament had adhered to the dura and small thick areas of  thinned dura occurred as the spinal ligament tissue was  removed.  However, no tear of the dura or tear of the arachnoid and no CSF leakage  was seen at anytime, it was felt that good decompression was achieved of  not only the thecal sac, but at the exiting nerve root, although there  certainly remained extensive spondylosis throughout the lumbar region.  Nevertheless, the worst of the stenosis was certainly decompressed at  the L2-L3, L3-L4, and L4-L5 levels and good decompression of the nerve  roots was achieved.  Once the decompression was completed, hemostasis  was established with the use of Gelfoam soaked in thrombin.  We did  place a small piece of Duraform over the thinned areas of dura.  Gelfoam  was left in the laminectomy defect to help with the hemostatic process  and once good hemostasis was achieved and the decompression completed,  we proceeded with closure.  Paraspinal muscles were approximated with  interrupted undyed 1 Vicryl sutures.  The deep fascia with interrupted  undyed 1 Vicryl suture.  The Scarpa fascia was closed with interrupted  undyed 1 Vicryl sutures.  The subcutaneous and subcuticular were closed  with interrupted inverted 2-0 undyed Vicryl sutures.  The skin edges  were approximated with surgical staples.  The wound was dressed with  Adaptic and sterile gauze.  Procedure was tolerated well.  The estimated  blood loss was 1000 mL.  We did use a cell saver during the procedure,  we were able to return 400 mL of cell saver blood to the patient.  Sponge and needle counts were correct.  Following surgery, the patient  was turned back to the supine position.  He is to be reversed from the  anesthetic, extubated, and transferred to the Neurosurgical Intensive  Care Unit to be recovered by the recovery room staff in the ICU.      Hosie Spangle, M.D.  Electronically Signed     RWN/MEDQ  D:  06/12/2008  T:  06/13/2008  Job:  [id number]

## 2011-02-02 NOTE — Procedures (Signed)
DUPLEX DEEP VENOUS EXAM - LOWER EXTREMITY   INDICATION:  Cellulitis, possible venous disease.   HISTORY:  Edema:  Yes.  Trauma/Surgery:  History of knee surgeries.  Pain:  No.  PE:  No.  Previous DVT:  No.  Anticoagulants:  Other:   DUPLEX EXAM:                CFV   SFV   PopV  PTV    GSV                R  L  R  L  R  L  R   L  R  L  Thrombosis    o  o  o  o  o  o  o   o  o  o  Spontaneous   +  +  +  +  +  +  +   +  +  +  Phasic        +  +  +  +  +  +  +   +  +  +  Augmentation  +  +  +  +  +  +  +   +  +  +  Compressible  +  +  +  +  +  +  +   +  +  +  Competent     0  0  +  +  +  +  +   +  0  +   Legend:  + - yes  o - no  p - partial  D - decreased   IMPRESSION:  1. No evidence of deep venous thrombosis noted in the bilateral lower      extremities, based on limited visualization.  2. Reflux is noted in the right greater saphenous vein at the      saphenofemoral junction through mid thigh levels.  3. Reflux is also noted at the left saphenofemoral junction and the      bilateral common femoral vein levels.  4. Enlarged lymph nodes noted in the bilateral groin areas.  5. Decreased visualization of the bilateral lower extremity veins due      to patient body habitus and leg edema.    _____________________________  Nelda Severe. Kellie Simmering, M.D.   CH/MEDQ  D:  05/20/2008  T:  05/20/2008  Job:  [id number]

## 2011-02-02 NOTE — Op Note (Signed)
[name], [name]                 ACCOUNT NO.:  [id number]   MEDICAL RECORD NO.:  [id number]          PATIENT TYPE:  INP   LOCATION:  [place]                         FACILITY:  [facility]   PHYSICIAN:  Gaynelle Arabian, M.D.    DATE OF BIRTH:  1941/07/23   DATE OF PROCEDURE:  12/18/2007  DATE OF DISCHARGE:                               OPERATIVE REPORT   PREOPERATIVE DIAGNOSIS:  Right knee postoperative hematoma.   POSTOPERATIVE DIAGNOSIS:  Right knee postoperative hematoma.   PROCEDURE:  Right knee irrigation and debridement with hematoma  evacuation.   SURGEON:  Gaynelle Arabian, M.D.   ASSISTANT:  Arlee Muslim PA-C   ANESTHESIA:  General.   ESTIMATED BLOOD LOSS:  200 mL.   DRAIN:  Hemovac times one.   TOURNIQUET TIME:  15 minutes at 300 mmHg.   COMPLICATIONS:  None.   CONDITION:  Stable to recovery.   BRIEF CLINICAL NOTE:  [name] is a 70 year old male who had a complex  right total knee arthroplasty revision performed a week ago.  Postoperatively did very well functionally, was discharged Friday but  later that afternoon developed drainage from his wound.  He was  readmitted.  He has a large hemarthrosis.  It is presumed that he does  have a coagulopathy as he had bleeding with his index procedure at [facility] a year ago.  His Coumadin was reversed and he  presents today for irrigation, debridement and hematoma evacuation.   PROCEDURE IN DETAIL:  And the successful administration of general  anesthetic, tourniquet placed high on the right thigh and right lower  extremity prepped and draped in the usual sterile fashion.  Extremities  elevated for one minute and tourniquet inflated to 300 mmHg.  I used his  previous incision and upon opening the incision, there is a very large  hematoma in the subcutaneous tissue.  I evacuated the hematoma there  manually.  Then made a small arthrotomy just adjacent to the patella  medially and a tremendous amount of hematoma  came from the joint, too.  We then irrigated the joint with approximately 5 liters of saline with  pulsatile lavage evacuating all the hematoma and the joint looked fine  after that.  There is absolutely no sign of infection.  Hemovac drain is  then placed and I closed the arthrotomy with interrupted #1 PDS.  I  released the tourniquet for time of approximately 15 minutes.  I then  used the other 500 mL of saline with pulsatile lavage to irrigate the  subcu tissues.  Once it was irrigated and  hematoma completely removed then the subcu was closed with interrupted 2-  0 Vicryl.  The drain was hooked to suction and the skin closed with  staples.  A bulky sterile dressing was applied and he is awakened and  transferred to recovery in stable condition.      Gaynelle Arabian, M.D.  Electronically Signed     FA/MEDQ  D:  12/18/2007  T:  12/19/2007  Job:  [id number]

## 2011-02-05 NOTE — Discharge Summary (Signed)
NAMEDENNISON, LABOMBARD                 ACCOUNT NO.:  [id number]   MEDICAL RECORD NO.:  [id number]          PATIENT TYPE:  INP   LOCATION:  1620                         [facility]ACILITY:  [facility]   PHYSICIAN:  Gaynelle Arabian, M.D.    DATE OF BIRTH:  [birth year]   DATE OF ADMISSION:  12/15/2007  DATE OF DISCHARGE:  12/22/2007                               DISCHARGE SUMMARY   ADMISSION DIAGNOSES:  1. Hematoma, right knee postop.  2. Recent right total knee arthroplasty revision.  3. Recent acute blood loss anemia postop knee revision.  4. Status post transfusion postop knee revision.  5. Mild postop hyponatremia, improved following knee revision.  6. Moderate-to-severe aortic stenosis with cardiac murmur.  7. History of diet medication with symptoms.  8. Hypercholesterolemia.  9. Degenerative disk disease.  10.History of gout.  11.Right foot drop preexisting prior to knee revision.   DISCHARGE DIAGNOSES:  1. Incision and drainage of right knee with hematoma evacuation.  2. Recurring postoperative blood loss anemia.  3. Status post transfusion without sequelae.  4. Mild hyponatremia, improved.  5. Status post transfusion postop knee revision.  6. Mild postoperative hyponatremia, improved following knee revision.  7. Moderate-to-severe aortic stenosis with cardiac murmur.  8. History of diet medication with symptoms.  9. Hypercholesterolemia.  10.Degenerative disk disease.  11.History of gout.  12.Right foot drop preexisting prior to knee revision.   PROCEDURE:  The patient was taken to the OR on December 18, 2007, underwent  an I&D of the right knee due to a postop hematoma with hematoma  evacuation.  Surgeon Dr. Wynelle Link, assistant Alexzandrew L. Dara Lords, Rainbow-  C.  Anesthesia general.   CONSULTS:  Hematology consult per Dr. Beryle Beams.   BRIEF HISTORY:  The patient is a 70 year old male well-known to Dr.  Gaynelle Arabian,  having previously undergone a revision arthroplasty of  the knee and had  a fair amount of bleeding and swelling postop.  He had  to have a knee aspiration at bedside when he was discharged home on  December 18, 2007.  Unfortunately, when he got home, he had profuse  bleeding from the knee and was unable to stop it using compression and  pressure.  He was brought back to the hospital and evaluated by Dr.  Onnie Graham that evening.  He was admitted for pain control secondary to  hematoma of the knee.  He was placed at bedrest, and Dr. Wynelle Link  followed up the following day.   LABORATORY DATA:  Admission CBC showed the hemoglobin low at 8.9,  hematocrit of 25.4.  This was also just recent postop knee revision.  White cell count 8.7, platelets 152.  During the hospital course,  hemoglobin dropped down to 8, came back to 8.3, then got as low as 7.6.  Did require 2  more units of blood, post-transfusion hemoglobin 9.5 and  27.2.  PT/INR on admission 26.8/2.4.  He was on Coumadin.  The Coumadin  was removed, allowed to drift down.  By December 18, 2007, INR was down to  1.6.  Stopped his Coumadin.  It was 1.7 on March  31, then came down to a  normal level with last PT/INR 14.8 and 1.1.  A von Willebrand panel  factor VIII activity was 73, von Willebrand antigen was 119.  Ristocetin  cofactor 58.  The BMET on admission showed a mildly low sodium of 134,  remaining BMET within normal limits.  Serial BMETs were followed.  Sodium came up to 130.  The remaining electrolytes remained within  normal limits.  Blood group type was O positive.   HOSPITAL COURSE:  The patient was admitted to [facility],  placed at bedrest per Dr. Justice Britain for postop hematoma of the right  knee with  profuse bleeding.  Coumadin was held.  He was seen on  hospital day #2, on 12/16/2007, by Dr. Wynelle Link.  He was concerned about  having a bleeding disorder due to the large hematoma.  We decided to do  an I&D.  His Coumadin was still therapeutic so we stopped his Coumadin,  held that, and  planned for the OR on the following Monday.  On hospital  day #3, his INR was still 1.8 but drifting slowly downward.  By hospital  day #4, December 18, 2007, hemoglobin was down to 8.  He was for surgery  later that day.  We gave him 2 units of blood preop.  He went to surgery  later that evening on December 18, 2007, and underwent the above procedure  without complication.  We put him on aspirin.  Due to suspected  coagulopathy and risk of recurrent hemarthrosis we called a hematology  consult.  He was doing pretty well on the morning of day one.  He was on  aspirin only.  We held his CPM, but we did allow him to walk and  weightbear.  He was on PCA for a day.  We discontinued that on postop  day #1.  Hemoglobin was 8.3 following surgery and blood by postop day  #2,  December 20, 2007, although his hemoglobin drifted back down again to  7.6.  He did have a Hemovac drain placed at the time of surgery and we  left that in for a couple of days.  He was still having some output  through that.  Platelets were low normal at 222.  He was seen in  consultation by Dr. Beryle Beams and worked up and had several studies  done and also a von Willebrand profile did not show any obvious platelet  problems.  The study can be further worked up on an outpatient basis.  Hemoglobin came back up to 9.5 after receiving blood on postop day #3.  He still had a little bit of output through his Hemovac so we left it in  another day.  Dr. Beryle Beams recommended holding the aspirin and the  NSAIDs but did not show any obvious evidence of any major coagulopathy  at that time.  On the following day of December 22, 2007, he was doing well.  Swelling had gone down.  We pulled his Hemovac without difficulty.  He  was taking his medications well, progressing with therapy, dressing  changes.  Incision looked good, no signs of infection, and he was  discharged home.   DISCHARGE/PLAN:  1. The patient discharged home on December 22, 2007.  2. For discharge diagnoses, please see above.  3. Discharge medications:  Percocet, Robaxin, Nu-Iron.  4. Diet:  Low-sodium, heart-healthy, low cholesterol diet.  5. Activity:  Dressing changes daily at home, 4x4s and ABDs for  drainage.  He may be weightbearing as tolerated.  Resume his home      therapy.  Do not submerge the incision in water.  6. Follow up next Tuesday on December 28, 2007.   DISPOSITION:  Home.   CONDITION ON DISCHARGE:  Improved.      Alexzandrew L. Perkins, P.A.C.      Gaynelle Arabian, M.D.  Electronically Signed    ALP/MEDQ  D:  02/20/2008  T:  02/20/2008  Job:  [id number]   cc:   Gaynelle Arabian, M.D.  Fax: [place]. Beryle Beams, M.D.  Fax: [phone]   Freeman Caldron. Pauline Aus, M.D.  Fax: [place]

## 2011-02-05 NOTE — Discharge Summary (Signed)
[name], BOEKE                 ACCOUNT NO.:  [id number]   MEDICAL RECORD NO.:  [id number]          PATIENT TYPE:  INP   LOCATION:  3025                         FACILITY:  [place]   PHYSICIAN:  Hosie Spangle, M.D.DATE OF BIRTH:  01-15-1941   DATE OF ADMISSION:  06/08/2008  DATE OF DISCHARGE:  06/14/2008                               DISCHARGE SUMMARY   ADMISSION HISTORY AND PHYSICAL EXAMINATION:  The patient is a 70-year-  old white male who was admitted on June 08, 2008, by Dr. Newman Pies for progressive weakness and gait difficulty.  He had an MRI  scan a few days earlier, which showed severe multilevel multifactorial  lumbar stenosis from L2 to L5.  The patient was admitted with  progressive paraparesis with an increasing neurogenic claudication.  The  patient was seen and admitted by Dr. Arnoldo Morale.  He received a Neurology  consultation by Dr. Wallie Char.  His admission exam was notable for  morbid obesity and paraparesis.   HOSPITAL COURSE:  The patient was admitted by Dr. Arnoldo Morale seen in  Neurology consultation by Dr. Wallie Char.  I saw the patient about  2 days after admission and reviewed his MRI scan.  His history of  progressive paraparesis over the week prior to admission with increasing  neurogenic claudication over the past couple of years was noted and we  discussed with the patient the nature of his condition, alternatives for  treatment specifically surgery for decompression and significant risks  that were associated with that due his morbid obesity, sleep apnea,  polyneuropathy, and so on.  Nevertheless, the patient did agree to  undergo surgery.  He was taken to Surgery and underwent an L2-L5  decompressive lumbar laminectomy.  Postoperatively, he did fairly well  without significant pulmonary complications.  He was seen by physical  therapy and occupational therapy and it was felt that he would benefit  from comprehensive inpatient  rehabilitation.  He was seen in  consultation by Dr. Alger Simons from the Rehabilitation Service, and  he was accepted to the rehabilitation unit.  He was transferred by Dr.  Ellene Route on the second postoperative day 2 rehabilitation and Dr. Ellene Route  requested that I dictate his discharge summary.   DISCHARGE DIAGNOSES:  1. Lumbar stenosis, lumbar spondylosis, lumbar degenerative disease,      and neurogenic claudication.  2. Progressive paraparesis secondary to lumbar stenosis, lumbar      spondylosis, lumbar degenerative disease, and neurogenic      claudication.  3. Morbid obesity, sleep apnea, and polyneuropathy (significant      comorbidities).      Hosie Spangle, M.D.  Electronically Signed     RWN/MEDQ  D:  10/30/2008  T:  10/31/2008  Job:  [id number]

## 2011-02-05 NOTE — Assessment & Plan Note (Signed)
[place]                             PULMONARY OFFICE NOTE   FINEAS, TWEET                          MRN:          [id number]  DATE:01/20/2007                            DOB:          12-01-1940    PROBLEM:  1. Obstructive sleep apnea.  2. Exogenous obesity.   HISTORY:  Had right knee replacement in March, apparently without  respiratory complications.  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  He continues CPAP at 17 CWP.  His wife has told  him that he snores through the CPAP a little bit occasionally but he  feels rested and does not understand that the snoring is much or often.  He says echocardiogram showed a narrow valve but does not need  intervention now.  He continues to smoke a pipe occasionally.   MEDICATIONS:  1. CPAP at 17 CWP.  2. Diclofenac.  3. Lipitor 40 mg.  4. Aspirin 325 mg.  5. Iron.   NO MEDICATION ALLERGY.   OBJECTIVE:  VITAL SIGNS:  Weight is over 350 pounds, BP 142/62, pulse  73, room air saturation 98%.  GENERAL:  He is alert with no pressure marks from his CPAP mask on his  face.  HEART:  There is a grade 1/6 systolic murmur at the left upper sternal  border.  '  CHEST:  Quiet.  No rales or rhonchi.  EXTREMITIES:  Heavy legs without edema.   IMPRESSION:  Obstructive sleep apnea is currently controlled at 17 CWP  but would be significantly improved by weight loss.  I have encouraged  that with discussion again.   PLAN:  He will ask his wife to be a little more specific about what she  notices of his snoring, and he will call to consider an increase in  pressure if necessary.  Otherwise, schedule return in one year, earlier  p.r.n.     Clinton D. Annamaria Boots, MD, [place], FACP  Electronically Signed   CDY/MedQ  DD: 01/21/2007  DT: 01/21/2007  Job #: [id number]   cc:   [facility]

## 2011-02-05 NOTE — Discharge Summary (Signed)
NAMECAMRYN, [name]                 ACCOUNT NO.:  [id number]   MEDICAL RECORD NO.:  [id number]          PATIENT TYPE:  INP   LOCATION:  [place]                         FACILITY:  [facility]   PHYSICIAN:  Gaynelle Arabian, M.D.    DATE OF BIRTH:  [birth year]   DATE OF ADMISSION:  12/11/2007  DATE OF DISCHARGE:  12/15/2007                               DISCHARGE SUMMARY   ADMITTING DIAGNOSES:  1. Grossly unstable right total knee.  2. Moderate to severe aortic stenosis with cardiac murmur.  3. History of diet medication symptom.  4. Hypercholesterolemia.  5. Degenerative disk disease.  6. History of gout.  7. Right foot drop preexisting from previous surgery.   DISCHARGE DIAGNOSES:  1. Unstable right total knee arthroplasty status post right total knee      arthroplasty revision.  2. Acute blood loss postop anemia.  3. Status post transfusion without sequelae.  4. Mild postop hyponatremia improved.  5. Moderate to severe aortic stenosis with cardiac murmur.  6. History of diet medication symptom.  7. Hypercholesterolemia.  8. Degenerative disk disease.  9. History of gout.  10.Right foot drop preexisting from previous surgery.   PROCEDURE:  December 11, 2007, right total knee arthroplasty revision.  Surgeon Dr. Wynelle Link.  Assistant Arlee Muslim PA-C.  Anesthesia general.  Tourniquet 67 minutes, down for 8 minutes and up additional 40 minutes.   CONSULTS:  None.   BRIEF HISTORY:  Kalil is a 70 year old male with a right total knee  arthroplasty done at [facility] over a year ago.  He has had progressive  worsening pain and dysfunction with instability of the knee.  The knee  will buckle and give out on him.  On exam, he has a gross AP instability  and flexion to the point where he can almost dislocate the knee.  He has  failed bracing and now presents for a total knee arthroplasty revision.   LABORATORY DATA:  Preop CBC showed a hemoglobin 12.7, hematocrit 37.3,  white cell count 6.2, red cell  count 4.16, postop hemoglobin 9.9 drifted  down to 8.6 and to 7.8, given blood.  Postop hemoglobin back up to 8.6  and 25.7.  PT/PTT preop 13.3 and 30, respectively.  INR 1.0.  Serial pro  times followed.  Last noted PT/INR 26.2 and 2.3.  BMET on admission  within normal limits.  Serial BMETs are followed.  Sodium did drop from  143 to 134, back up to 135, glucose went up from 104 to 145, back down  to 122.  Slight low albumin on admission Chem panel was 30.4.  Preop UA:  Trace ketones, otherwise negative.  Blood group type O+.  Cultures taken  at time of surgery:  Fluid culture taken; smear showed no organisms, no  WBCs, no growth on the culture for 3 days.  A STAT Gram stain showed no  WBCs, no organisms.  Anaerobic culture smear showed no organisms, no  anaerobes isolated   Chest X-Ray:  December 01, 2007, negative for acute cardiopulmonary  disease, had an EKG dated October 10, 2007, borderline first-degree AV  block, incomplete right bundle branch block confirmed by Stanford Scotland,  nurse practitioner.   HOSPITAL COURSE:  The patient was admitted to [facility],  taken to the OR and underwent the above-stated procedure without  complication.  The patient tolerated the procedure well.  Surgery was  performed under general anesthesia.  He was later transferred to  recovery room and the orthopedic floor.  On the morning of day 1, he was  doing extremely well for day 1 after a full revision arthroplasty.  Briefly discussed the surgical findings with Dr. Wynelle Link.  We started  getting him up with therapy.  He did have a preexisting foot drop from a  previous surgery.  This was not a new finding.  We left the drain in and  also the pain pump in on the morning of day 1.  Hemoglobin was 9.9  postop.  He did have moderate severe aortic stenosis.  We watched his  pressures, and his pressure looked good.  He had excellent urinary  output, did have sleep apnea, so we resumed his CPAP.   Sodium was a  little low, so we reduced his fluids.  He started getting up out of bed.  He was only able to sit and transfer, but by postop day #2, he was  getting up and walking some short distances.  He was doing much better  by day 2.  Dressing was changed.  Hemoglobin was down, unfortunately, to  8.6, though.  We rechecked his hemoglobin since he was asymptomatic with  it.  We changed the dressing, had a fair amount of drainage, did expect  him to have some from the large surgery.  In most the drain it was  actually coming out from the Hemovac site where the drain was pulled by  day 3.  Incision looked good.  No signs of infection.  He did have a  little bit of flicker in his EHL and EDL which he did not have preop.  Unfortunately, his hemoglobin was down to 7.8 so we gave him 2 units of  blood, kept him another day for monitoring, received the blood,  progressed well, felt much better by the following day of December 15, 2007.  His hemoglobin was back up, progressing well.  Hemoglobin was up  to 8.6, tolerating his therapy.  He was discharged home.  Prior to being  discharged though, he underwent a bedside procedure as followed.   BEDSIDE PROCEDURE:  Utilizing sterile technique, the patient underwent a  knee aspiration with infiltration in the superior lateral portal area of  3 mL of 1% Xylocaine followed by an aspiration of about 60 mL of bloody  fluid.  He tolerated this well.  This was performed at bedside by Dr.  Wynelle Link prior to discharge.  After this was done, he felt better.  Arrangements were made.  He was discharged home.   DISCHARGE/PLAN:  1. The patient discharged home on December 15, 2007.  2. Discharge diagnoses, please see above.  3. Discharge medications Percocet, Robaxin, Nu-Iron, Coumadin.   ACTIVITY:  Weightbearing as tolerated, total knee protocol.  Home health  PT and home nursing.   DIET:  Low-sodium heart-healthy low-cholesterol diet.   FOLLOWUP:  2 weeks.    DISPOSITION:  Home.   CONDITION ON DISCHARGE:  Improving.      Alexzandrew L. Perkins, P.A.C.      Gaynelle Arabian, M.D.  Electronically Signed    ALP/MEDQ  D:  02/01/2008  T:  02/01/2008  Job:  [id number]

## 2011-03-16 ENCOUNTER — Ambulatory Visit: Payer: Self-pay | Admitting: Internal Medicine

## 2011-04-23 ENCOUNTER — Ambulatory Visit: Payer: Self-pay | Admitting: Internal Medicine

## 2011-06-14 LAB — CBC
HCT: 37.3 — ABNORMAL LOW
HCT: 22.8 — ABNORMAL LOW
HCT: 23.5 — ABNORMAL LOW
HCT: 23.8 — ABNORMAL LOW
HCT: 25.4 — ABNORMAL LOW
HCT: 25.5 — ABNORMAL LOW
HCT: 25.7 — ABNORMAL LOW
HCT: 25.7 — ABNORMAL LOW
HCT: 28.2 — ABNORMAL LOW
Hemoglobin: 12.7 — ABNORMAL LOW
Hemoglobin: 7.8 — CL
Hemoglobin: 8 — ABNORMAL LOW
Hemoglobin: 8.3 — ABNORMAL LOW
Hemoglobin: 8.6 — ABNORMAL LOW
Hemoglobin: 8.6 — ABNORMAL LOW
Hemoglobin: 8.8 — ABNORMAL LOW
Hemoglobin: 8.9 — ABNORMAL LOW
Hemoglobin: 9.9 — ABNORMAL LOW
MCHC: 34
MCHC: 33.3
MCHC: 33.5
MCHC: 33.9
MCHC: 34.1
MCHC: 34.7
MCHC: 35
MCHC: 35.1
MCHC: 35.2
MCV: 89.6
MCV: 89
MCV: 89.2
MCV: 89.3
MCV: 89.4
MCV: 89.5
MCV: 89.5
MCV: 90
MCV: 90.5
Platelets: 153
Platelets: 111 — ABNORMAL LOW
Platelets: 114 — ABNORMAL LOW
Platelets: 130 — ABNORMAL LOW
Platelets: 132 — ABNORMAL LOW
Platelets: 133 — ABNORMAL LOW
Platelets: 152
Platelets: 194
Platelets: 223
RBC: 4.16 — ABNORMAL LOW
RBC: 2.55 — ABNORMAL LOW
RBC: 2.63 — ABNORMAL LOW
RBC: 2.66 — ABNORMAL LOW
RBC: 2.82 — ABNORMAL LOW
RBC: 2.85 — ABNORMAL LOW
RBC: 2.86 — ABNORMAL LOW
RBC: 2.89 — ABNORMAL LOW
RBC: 3.15 — ABNORMAL LOW
RDW: 15.5
RDW: 14.6
RDW: 14.6
RDW: 14.7
RDW: 14.8
RDW: 14.9
RDW: 15.1
RDW: 15.1
RDW: 15.2
WBC: 6.2
WBC: 6.8
WBC: 6.8
WBC: 7.7
WBC: 7.9
WBC: 8.4
WBC: 8.6
WBC: 8.7
WBC: 9.8

## 2011-06-14 LAB — COMPREHENSIVE METABOLIC PANEL
ALT: 31
AST: 26
Albumin: 3.4 — ABNORMAL LOW
Alkaline Phosphatase: 84
BUN: 22
CO2: 29
Calcium: 9
Chloride: 108
Creatinine, Ser: 0.96
GFR calc Af Amer: >60
GFR calc non Af Amer: >60
Glucose, Bld: 104 — ABNORMAL HIGH
Potassium: 4.8
Sodium: 143
Total Bilirubin: 0.7
Total Protein: 6.1

## 2011-06-14 LAB — ANAEROBIC CULTURE: Gram Stain: NONE SEEN

## 2011-06-14 LAB — TYPE AND SCREEN
ABO/RH(D): O POS
Antibody Screen: NEGATIVE

## 2011-06-14 LAB — BASIC METABOLIC PANEL
BUN: 10
BUN: 11
BUN: 12
BUN: 14
BUN: 8
CO2: 30
CO2: 30
CO2: 30
CO2: 31
CO2: 31
Calcium: 8 — ABNORMAL LOW
Calcium: 8.1 — ABNORMAL LOW
Calcium: 8.1 — ABNORMAL LOW
Calcium: 8.2 — ABNORMAL LOW
Calcium: 8.3 — ABNORMAL LOW
Chloride: 100
Chloride: 101
Chloride: 102
Chloride: 98
Chloride: 99
Creatinine, Ser: 0.78
Creatinine, Ser: 0.78
Creatinine, Ser: 0.81
Creatinine, Ser: 0.81
Creatinine, Ser: 0.84
GFR calc Af Amer: >60
GFR calc Af Amer: >60
GFR calc Af Amer: >60
GFR calc Af Amer: >60
GFR calc Af Amer: >60
GFR calc non Af Amer: >60
GFR calc non Af Amer: >60
GFR calc non Af Amer: >60
GFR calc non Af Amer: >60
GFR calc non Af Amer: >60
Glucose, Bld: 101 — ABNORMAL HIGH
Glucose, Bld: 114 — ABNORMAL HIGH
Glucose, Bld: 122 — ABNORMAL HIGH
Glucose, Bld: 145 — ABNORMAL HIGH
Glucose, Bld: 95
Potassium: 3.6
Potassium: 4.1
Potassium: 4.3
Potassium: 4.4
Potassium: 4.5
Sodium: 134 — ABNORMAL LOW
Sodium: 134 — ABNORMAL LOW
Sodium: 135
Sodium: 136
Sodium: 139

## 2011-06-14 LAB — APTT: aPTT: 30

## 2011-06-14 LAB — URINALYSIS, ROUTINE W REFLEX MICROSCOPIC
Bilirubin Urine: NEGATIVE
Glucose, UA: NEGATIVE
Hgb urine dipstick: NEGATIVE
Nitrite: NEGATIVE
Protein, ur: NEGATIVE
Specific Gravity, Urine: 1.027
Urobilinogen, UA: 0.2
pH: 5.5

## 2011-06-14 LAB — DIFFERENTIAL
Basophils Absolute: 0.1
Basophils Relative: 1
Eosinophils Absolute: 0.2
Eosinophils Relative: 2
Lymphocytes Relative: 12
Lymphs Abs: 1.1
Monocytes Absolute: 0.8
Monocytes Relative: 9
Neutro Abs: 6.6
Neutrophils Relative %: 76

## 2011-06-14 LAB — BODY FLUID CULTURE
Culture: NO GROWTH
Gram Stain: NONE SEEN

## 2011-06-14 LAB — CROSSMATCH
ABO/RH(D): O POS
Antibody Screen: NEGATIVE

## 2011-06-14 LAB — PROTIME-INR
INR: 1
INR: 1.2
INR: 1.4
INR: 1.6 — ABNORMAL HIGH
INR: 1.7 — ABNORMAL HIGH
INR: 1.8 — ABNORMAL HIGH
INR: 2 — ABNORMAL HIGH
INR: 2.3 — ABNORMAL HIGH
INR: 2.4 — ABNORMAL HIGH
Prothrombin Time: 13.3
Prothrombin Time: 15.3 — ABNORMAL HIGH
Prothrombin Time: 17.8 — ABNORMAL HIGH
Prothrombin Time: 19.4 — ABNORMAL HIGH
Prothrombin Time: 20.8 — ABNORMAL HIGH
Prothrombin Time: 21.7 — ABNORMAL HIGH
Prothrombin Time: 22.9 — ABNORMAL HIGH
Prothrombin Time: 26.2 — ABNORMAL HIGH
Prothrombin Time: 26.8 — ABNORMAL HIGH

## 2011-06-14 LAB — GRAM STAIN: Gram Stain: NONE SEEN

## 2011-06-14 LAB — ABO/RH: ABO/RH(D): O POS

## 2011-06-14 LAB — PREPARE RBC (CROSSMATCH)

## 2011-06-15 LAB — PROTIME-INR
INR: 1.1
INR: 1.3
INR: 1.4
Prothrombin Time: 14.8
Prothrombin Time: 16.4 — ABNORMAL HIGH
Prothrombin Time: 17.2 — ABNORMAL HIGH

## 2011-06-15 LAB — BASIC METABOLIC PANEL
BUN: 12
BUN: 12
CO2: 31
CO2: 32
Calcium: 8 — ABNORMAL LOW
Calcium: 8.4
Chloride: 102
Chloride: 102
Creatinine, Ser: 0.81
Creatinine, Ser: 0.85
GFR calc Af Amer: >60
GFR calc Af Amer: >60
GFR calc non Af Amer: >60
GFR calc non Af Amer: >60
Glucose, Bld: 109 — ABNORMAL HIGH
Glucose, Bld: 99
Potassium: 4.2
Potassium: 4.4
Sodium: 138
Sodium: 139

## 2011-06-15 LAB — CBC
HCT: 21.8 — ABNORMAL LOW
HCT: 27.2 — ABNORMAL LOW
Hemoglobin: 7.6 — CL
Hemoglobin: 9.5 — ABNORMAL LOW
MCHC: 35
MCHC: 35
MCV: 89
MCV: 89.3
Platelets: 217
Platelets: 222
RBC: 2.45 — ABNORMAL LOW
RBC: 3.04 — ABNORMAL LOW
RDW: 14.5
RDW: 15.3
WBC: 8.3
WBC: 8.6

## 2011-06-15 LAB — APTT: aPTT: 40 — ABNORMAL HIGH

## 2011-06-15 LAB — PREPARE RBC (CROSSMATCH)

## 2011-06-15 LAB — VON WILLEBRAND PANEL
Factor-VIII Activity: 73 % (ref 50–150)
Ristocetin Co-Factor: 58 % (ref 50–150)
Von Willebrand Ag: 119 % normal (ref 61–164)

## 2011-06-15 LAB — BLEEDING TIME: Bleeding Time: 7

## 2011-06-21 LAB — BASIC METABOLIC PANEL
BUN: 14
CO2: 28
Calcium: 8.8
Chloride: 106
Creatinine, Ser: 0.9
GFR calc Af Amer: >60
GFR calc non Af Amer: >60
Glucose, Bld: 120 — ABNORMAL HIGH
Potassium: 4
Sodium: 141

## 2011-06-21 LAB — CBC
HCT: 31 — ABNORMAL LOW
HCT: 34.4 — ABNORMAL LOW
HCT: 35.3 — ABNORMAL LOW
Hemoglobin: 10.3 — ABNORMAL LOW
Hemoglobin: 11.4 — ABNORMAL LOW
Hemoglobin: 11.6 — ABNORMAL LOW
MCHC: 33.1
MCHC: 33.1
MCHC: 33.2
MCV: 92.6
MCV: 93.3
MCV: 93.9
Platelets: 151
Platelets: 157
Platelets: 169
RBC: 3.33 — ABNORMAL LOW
RBC: 3.66 — ABNORMAL LOW
RBC: 3.81 — ABNORMAL LOW
RDW: 16.1 — ABNORMAL HIGH
RDW: 16.4 — ABNORMAL HIGH
RDW: 16.9 — ABNORMAL HIGH
WBC: 6.6
WBC: 7.4
WBC: 9.9

## 2011-06-21 LAB — DIFFERENTIAL
Basophils Absolute: 0
Basophils Absolute: 0
Basophils Relative: 0
Basophils Relative: 1
Eosinophils Absolute: 0
Eosinophils Absolute: 0.2
Eosinophils Relative: 0
Eosinophils Relative: 3
Lymphocytes Relative: 22
Lymphocytes Relative: 9 — ABNORMAL LOW
Lymphs Abs: 0.8
Lymphs Abs: 1.6
Monocytes Absolute: 0.5
Monocytes Absolute: 0.7
Monocytes Relative: 7
Monocytes Relative: 7
Neutro Abs: 5.1
Neutro Abs: 8.4 — ABNORMAL HIGH
Neutrophils Relative %: 68
Neutrophils Relative %: 85 — ABNORMAL HIGH

## 2011-06-21 LAB — COMPREHENSIVE METABOLIC PANEL
ALT: 25
AST: 20
Albumin: 2.6 — ABNORMAL LOW
Alkaline Phosphatase: 70
BUN: 14
CO2: 32
Calcium: 8.4
Chloride: 100
Creatinine, Ser: 0.67
GFR calc Af Amer: >60
GFR calc non Af Amer: >60
Glucose, Bld: 95
Potassium: 4
Sodium: 136
Total Bilirubin: 0.8
Total Protein: 5.2 — ABNORMAL LOW

## 2011-06-21 LAB — PROTIME-INR
INR: 1.1
Prothrombin Time: 14.2

## 2011-06-21 LAB — APTT: aPTT: 31

## 2011-06-22 LAB — CBC
HCT: 28.6 — ABNORMAL LOW
HCT: 29.6 — ABNORMAL LOW
Hemoglobin: 9.7 — ABNORMAL LOW
Hemoglobin: 9.8 — ABNORMAL LOW
MCHC: 33.2
MCHC: 34
MCV: 92.8
MCV: 93.2
Platelets: 176
Platelets: 201
RBC: 3.08 — ABNORMAL LOW
RBC: 3.18 — ABNORMAL LOW
RDW: 16.1 — ABNORMAL HIGH
RDW: 16.1 — ABNORMAL HIGH
WBC: 14.9 — ABNORMAL HIGH
WBC: 7.7

## 2011-06-22 LAB — URINALYSIS, ROUTINE W REFLEX MICROSCOPIC
Glucose, UA: NEGATIVE
Ketones, ur: NEGATIVE
Nitrite: POSITIVE — AB
Protein, ur: 30 — AB
Specific Gravity, Urine: 1.023
Urobilinogen, UA: 1
pH: 6

## 2011-06-22 LAB — BASIC METABOLIC PANEL
BUN: 15
CO2: 30
Calcium: 8.2 — ABNORMAL LOW
Chloride: 96
Creatinine, Ser: 0.94
GFR calc Af Amer: >60
GFR calc non Af Amer: >60
Glucose, Bld: 120 — ABNORMAL HIGH
Potassium: 3.5
Sodium: 134 — ABNORMAL LOW

## 2011-06-22 LAB — URINE CULTURE: Colony Count: >=100000

## 2011-06-22 LAB — URINE MICROSCOPIC-ADD ON

## 2011-08-09 ENCOUNTER — Encounter: Payer: Self-pay | Admitting: Internal Medicine

## 2011-08-09 ENCOUNTER — Ambulatory Visit (INDEPENDENT_AMBULATORY_CARE_PROVIDER_SITE_OTHER): Payer: Medicare Other | Admitting: Internal Medicine

## 2011-08-09 VITALS — BP 124/64 | HR 73 | Ht 74.0 in | Wt >= 6400 oz

## 2011-08-09 DIAGNOSIS — G4733 Obstructive sleep apnea (adult) (pediatric): Secondary | ICD-10-CM

## 2011-08-09 NOTE — Patient Instructions (Signed)
Continue CPAP- please call as needed

## 2011-08-09 NOTE — Progress Notes (Signed)
08/08/11- 70 yoM former smoker followed for OSA complicated by obesity, aortic stenosis, osteoarthritis LOV- 03/17/2010   wife here He has had flu vaccine At last visit CPAP 15 was too low and wife says he snored through it. We moved him back up to 18 CWP. He is comfortable with this and describes good compliance and control. He says because of his size, he never had heart valve replacement.  ROS- see HPI Constitutional:   No-   weight loss, night sweats, fevers, chills, fatigue, lassitude. HEENT:   No-  headaches, difficulty swallowing, tooth/dental problems, sore throat,       No-  sneezing, itching, ear ache, nasal congestion, post nasal drip,  CV:  No-   chest pain, orthopnea, PND, swelling in lower extremities, anasarca,                                  dizziness, palpitations Resp: No-   shortness of breath with exertion or at rest.              No-   productive cough,  No non-productive cough,  No- coughing up of blood.              No-   change in color of mucus.  No- wheezing.   Skin: No-   rash or lesions. GI:  No-   heartburn, indigestion, abdominal pain, nausea, vomiting, diarrhea,                 change in bowel habits, loss of appetite GU: No-   dysuria, change in color of urine, no urgency or frequency.  No- flank pain. MS:  No-   joint pain or swelling.  No- decreased range of motion.  No- back pain. Neuro-     nothing unusual Psych:  No- change in mood or affect. No depression or anxiety.  No memory loss.  OBJ General- Alert, Oriented, Affect-appropriate, Distress- none acute. Morbidly obese, wheelchair. Skin- rash-none, lesions- none, excoriation- none Lymphadenopathy- none Head- atraumatic            Eyes- Gross vision intact, PERRLA, conjunctivae clear secretions            Ears- Hearing, canals-normal            Nose- Clear, no-Septal dev, mucus, polyps, erosion, perforation             Throat- Mallampati III-IV , mucosa clear , drainage- none, tonsils-  atrophic Neck- flexible , trachea midline, no stridor , thyroid nl, carotid no bruit Chest - symmetrical excursion , unlabored           Heart/CV- RRR , [id number] systolic high pitched precordial murmur , no gallop,  no rub, nl s1 s2                           - JVD- none , edema- none, stasis changes- none, varices- none           Lung- clear to P&A, wheeze- none, cough- none , dullness-none, rub- none           Chest wall-  Abd- tender-no, distended-no, bowel sounds-present, HSM- no Br/ Gen/ Rectal- Not done, not indicated Extrem- cyanosis- none, clubbing, none, atrophy- none, strength- nl Neuro- grossly intact to observation

## 2011-08-10 NOTE — Assessment & Plan Note (Signed)
CPAP 18 is comfortable now and well tolerated with good compliance and control. Weight loss would help significantly but I see no evidence of progress.

## 2011-08-23 ENCOUNTER — Encounter: Payer: Self-pay | Admitting: Internal Medicine

## 2012-08-08 ENCOUNTER — Ambulatory Visit: Payer: Medicare Other | Admitting: Internal Medicine

## 2012-09-18 ENCOUNTER — Encounter: Payer: Self-pay | Admitting: Internal Medicine

## 2012-09-18 ENCOUNTER — Ambulatory Visit (INDEPENDENT_AMBULATORY_CARE_PROVIDER_SITE_OTHER): Payer: Medicare Other | Admitting: Internal Medicine

## 2012-09-18 VITALS — BP 132/90 | HR 60 | Ht 74.0 in | Wt >= 6400 oz

## 2012-09-18 DIAGNOSIS — I359 Nonrheumatic aortic valve disorder, unspecified: Secondary | ICD-10-CM

## 2012-09-18 DIAGNOSIS — G4733 Obstructive sleep apnea (adult) (pediatric): Secondary | ICD-10-CM

## 2012-09-18 NOTE — Patient Instructions (Addendum)
We can continue CPAP 18/ Advanced   Please call as needed  Flu vax

## 2012-09-18 NOTE — Progress Notes (Signed)
08/08/11- 70 yoM former smoker followed for OSA complicated by obesity, aortic stenosis, osteoarthritis LOV- 03/17/2010   wife here He has had flu vaccine At last visit CPAP 15 was too low and wife says he snored through it. We moved him back up to 18 CWP. He is comfortable with this and describes good compliance and control. He says because of his size, he never had heart valve replacement.  09/18/12 81 yoM former smoker followed for OSA complicated by obesity, aortic stenosis, osteoarthritis FOLLOWS [id number] wears CPAP/18/Advanced every night for about 8 hours; pressure working well for patient. He says he likes CPAP and has no issues with it. He remains comfortable with pressure.  ROS- see HPI Constitutional:   No-   weight loss, night sweats, fevers, chills, fatigue, lassitude. HEENT:   No-  headaches, difficulty swallowing, tooth/dental problems, sore throat,       No-  sneezing, itching, ear ache, nasal congestion, post nasal drip,  CV:  No-   chest pain, orthopnea, PND, swelling in lower extremities, anasarca,  dizziness, palpitations Resp: +shortness of breath with exertion or at rest.              No-   productive cough,  No non-productive cough,  No- coughing up of blood.              No-   change in color of mucus.  No- wheezing.   Skin: No-   rash or lesions. GI:  No-   heartburn, indigestion, abdominal pain, nausea, vomiting, GU:  MS:  No-   joint pain or swelling.   Neuro-     nothing unusual Psych:  No- change in mood or affect. No depression or anxiety.  No memory loss.  OBJ General- Alert, Oriented, Affect-appropriate, Distress- none acute. Morbidly obese, power wheelchair  Skin- rash-none, lesions- none, excoriation- none Lymphadenopathy- none Head- atraumatic            Eyes- Gross vision intact, PERRLA, conjunctivae clear secretions            Ears- Hearing, canals-normal            Nose- Clear, no-Septal dev, mucus, polyps, erosion, perforation   Throat- Mallampati III-IV , mucosa clear , drainage- none, tonsils- atrophic Neck- flexible , trachea midline, no stridor , thyroid nl, carotid no bruit Chest - symmetrical excursion , unlabored           Heart/CV- RRR , [id number] systolic high pitched precordial/ left parasternal murmur , no gallop,  no rub, nl s1 s2                           - JVD- none , edema- none, stasis changes- none, varices- none           Lung- clear to P&A, wheeze- none, cough- none , dullness-none, rub- none           Chest wall-  Abd-  Br/ Gen/ Rectal- Not done, not indicated Extrem- cyanosis- none, clubbing, none, atrophy- none, strength- nl Neuro- grossly intact to observation

## 2012-09-29 NOTE — Assessment & Plan Note (Signed)
Good compliance and control. This pressure is effective and well tolerated. Weight loss would help substantially.

## 2012-09-29 NOTE — Assessment & Plan Note (Signed)
He has not been able to bring his weight down.

## 2012-09-29 NOTE — Assessment & Plan Note (Signed)
He is aware of the murmur. His dyspnea is so dominated by his morbid obesity that it is difficult to estimate clinically whether the murmur is hemodynamically significant. This is left for cardiology

## 2013-02-05 ENCOUNTER — Telehealth (INDEPENDENT_AMBULATORY_CARE_PROVIDER_SITE_OTHER): Payer: Self-pay

## 2013-02-05 ENCOUNTER — Inpatient Hospital Stay (HOSPITAL_COMMUNITY)
Admission: EM | Admit: 2013-02-05 | Discharge: 2013-02-16 | DRG: 378 | Disposition: A | Discharge status: Skilled Nursing Facility | Payer: Medicare Other | Attending: Internal Medicine | Admitting: Internal Medicine

## 2013-02-05 ENCOUNTER — Encounter (HOSPITAL_COMMUNITY): Payer: Self-pay | Admitting: Nurse Practitioner

## 2013-02-05 DIAGNOSIS — M129 Arthropathy, unspecified: Secondary | ICD-10-CM | POA: Diagnosis present

## 2013-02-05 DIAGNOSIS — R5381 Other malaise: Secondary | ICD-10-CM | POA: Diagnosis present

## 2013-02-05 DIAGNOSIS — M109 Gout, unspecified: Secondary | ICD-10-CM

## 2013-02-05 DIAGNOSIS — I359 Nonrheumatic aortic valve disorder, unspecified: Secondary | ICD-10-CM | POA: Diagnosis present

## 2013-02-05 DIAGNOSIS — D696 Thrombocytopenia, unspecified: Secondary | ICD-10-CM | POA: Diagnosis not present

## 2013-02-05 DIAGNOSIS — M7150 Other bursitis, not elsewhere classified, unspecified site: Secondary | ICD-10-CM

## 2013-02-05 DIAGNOSIS — I440 Atrioventricular block, first degree: Secondary | ICD-10-CM | POA: Diagnosis not present

## 2013-02-05 DIAGNOSIS — I38 Endocarditis, valve unspecified: Secondary | ICD-10-CM | POA: Diagnosis present

## 2013-02-05 DIAGNOSIS — K922 Gastrointestinal hemorrhage, unspecified: Secondary | ICD-10-CM | POA: Diagnosis present

## 2013-02-05 DIAGNOSIS — G4733 Obstructive sleep apnea (adult) (pediatric): Secondary | ICD-10-CM | POA: Diagnosis present

## 2013-02-05 DIAGNOSIS — G608 Other hereditary and idiopathic neuropathies: Secondary | ICD-10-CM | POA: Diagnosis present

## 2013-02-05 DIAGNOSIS — Z6841 Body Mass Index (BMI) 40.0 and over, adult: Secondary | ICD-10-CM

## 2013-02-05 DIAGNOSIS — Z87891 Personal history of nicotine dependence: Secondary | ICD-10-CM

## 2013-02-05 DIAGNOSIS — I498 Other specified cardiac arrhythmias: Secondary | ICD-10-CM | POA: Diagnosis not present

## 2013-02-05 DIAGNOSIS — IMO0002 Reserved for concepts with insufficient information to code with codable children: Secondary | ICD-10-CM

## 2013-02-05 DIAGNOSIS — I959 Hypotension, unspecified: Secondary | ICD-10-CM | POA: Diagnosis present

## 2013-02-05 DIAGNOSIS — Z79899 Other long term (current) drug therapy: Secondary | ICD-10-CM

## 2013-02-05 DIAGNOSIS — D649 Anemia, unspecified: Secondary | ICD-10-CM

## 2013-02-05 DIAGNOSIS — M1990 Unspecified osteoarthritis, unspecified site: Secondary | ICD-10-CM

## 2013-02-05 DIAGNOSIS — R609 Edema, unspecified: Secondary | ICD-10-CM

## 2013-02-05 DIAGNOSIS — Z7982 Long term (current) use of aspirin: Secondary | ICD-10-CM

## 2013-02-05 DIAGNOSIS — E66813 Obesity, class 3: Secondary | ICD-10-CM | POA: Diagnosis present

## 2013-02-05 DIAGNOSIS — Z789 Other specified health status: Secondary | ICD-10-CM

## 2013-02-05 DIAGNOSIS — Z96659 Presence of unspecified artificial knee joint: Secondary | ICD-10-CM

## 2013-02-05 DIAGNOSIS — K5730 Diverticulosis of large intestine without perforation or abscess without bleeding: Secondary | ICD-10-CM | POA: Diagnosis present

## 2013-02-05 DIAGNOSIS — M1710 Unilateral primary osteoarthritis, unspecified knee: Secondary | ICD-10-CM | POA: Diagnosis present

## 2013-02-05 DIAGNOSIS — I442 Atrioventricular block, complete: Secondary | ICD-10-CM | POA: Diagnosis present

## 2013-02-05 DIAGNOSIS — E7800 Pure hypercholesterolemia, unspecified: Secondary | ICD-10-CM

## 2013-02-05 DIAGNOSIS — K5521 Angiodysplasia of colon with hemorrhage: Principal | ICD-10-CM

## 2013-02-05 DIAGNOSIS — D62 Acute posthemorrhagic anemia: Secondary | ICD-10-CM | POA: Diagnosis present

## 2013-02-05 HISTORY — DX: Essential (primary) hypertension: I10

## 2013-02-05 LAB — COMPREHENSIVE METABOLIC PANEL
ALT: 17 U/L (ref 0–53)
AST: 15 U/L (ref 0–37)
Albumin: 2.3 g/dL — ABNORMAL LOW (ref 3.5–5.2)
Alkaline Phosphatase: 50 U/L (ref 39–117)
BUN: 49 mg/dL — ABNORMAL HIGH (ref 6–23)
CO2: 18 mEq/L — ABNORMAL LOW (ref 19–32)
Calcium: 7.3 mg/dL — ABNORMAL LOW (ref 8.4–10.5)
Chloride: 105 mEq/L (ref 96–112)
Creatinine, Ser: 1.54 mg/dL — ABNORMAL HIGH (ref 0.50–1.35)
GFR calc Af Amer: 51 mL/min — ABNORMAL LOW (ref >90)
GFR calc non Af Amer: 44 mL/min — ABNORMAL LOW (ref >90)
Glucose, Bld: 120 mg/dL — ABNORMAL HIGH (ref 70–99)
Potassium: 4.9 mEq/L (ref 3.5–5.1)
Sodium: 136 mEq/L (ref 135–145)
Total Bilirubin: 0.2 mg/dL — ABNORMAL LOW (ref 0.3–1.2)
Total Protein: 4.4 g/dL — ABNORMAL LOW (ref 6.0–8.3)

## 2013-02-05 LAB — CBC WITH DIFFERENTIAL/PLATELET
Basophils Absolute: 0 10*3/uL (ref 0.0–0.1)
Basophils Relative: 0 % (ref 0–1)
Eosinophils Absolute: 0 10*3/uL (ref 0.0–0.7)
Eosinophils Relative: 0 % (ref 0–5)
HCT: 18.4 % — ABNORMAL LOW (ref 39.0–52.0)
Hemoglobin: 6 g/dL — CL (ref 13.0–17.0)
Lymphocytes Relative: 9 % — ABNORMAL LOW (ref 12–46)
Lymphs Abs: 1.2 10*3/uL (ref 0.7–4.0)
MCH: 30.3 pg (ref 26.0–34.0)
MCHC: 32.6 g/dL (ref 30.0–36.0)
MCV: 92.9 fL (ref 78.0–100.0)
Monocytes Absolute: 0.6 10*3/uL (ref 0.1–1.0)
Monocytes Relative: 4 % (ref 3–12)
Neutro Abs: 11.6 10*3/uL — ABNORMAL HIGH (ref 1.7–7.7)
Neutrophils Relative %: 87 % — ABNORMAL HIGH (ref 43–77)
Platelets: 142 10*3/uL — ABNORMAL LOW (ref 150–400)
RBC: 1.98 MIL/uL — ABNORMAL LOW (ref 4.22–5.81)
RDW: 15.2 % (ref 11.5–15.5)
WBC: 13.3 10*3/uL — ABNORMAL HIGH (ref 4.0–10.5)

## 2013-02-05 LAB — PREPARE FRESH FROZEN PLASMA
Unit division: 0
Unit division: 0

## 2013-02-05 LAB — CBC
HCT: 21.5 % — ABNORMAL LOW (ref 39.0–52.0)
Hemoglobin: 7.3 g/dL — ABNORMAL LOW (ref 13.0–17.0)
MCH: 30.8 pg (ref 26.0–34.0)
MCHC: 34 g/dL (ref 30.0–36.0)
MCV: 90.7 fL (ref 78.0–100.0)
Platelets: 145 10*3/uL — ABNORMAL LOW (ref 150–400)
RBC: 2.37 MIL/uL — ABNORMAL LOW (ref 4.22–5.81)
RDW: 15.2 % (ref 11.5–15.5)
WBC: 13.1 10*3/uL — ABNORMAL HIGH (ref 4.0–10.5)

## 2013-02-05 LAB — PREPARE RBC (CROSSMATCH)

## 2013-02-05 LAB — ABO/RH: ABO/RH(D): O POS

## 2013-02-05 LAB — MRSA PCR SCREENING: MRSA by PCR: NEGATIVE

## 2013-02-05 LAB — OCCULT BLOOD, POC DEVICE: Fecal Occult Bld: POSITIVE — AB

## 2013-02-05 MED ORDER — ATORVASTATIN CALCIUM 40 MG PO TABS
40.0000 mg | ORAL_TABLET | Freq: Every day | ORAL | Status: DC
  Administered 2013-02-06 – 2013-02-08 (×3): 40 mg via ORAL
  Filled 2013-02-05 (×5): qty 1

## 2013-02-05 MED ORDER — GABAPENTIN 100 MG PO CAPS
200.0000 mg | ORAL_CAPSULE | Freq: Every day | ORAL | Status: DC
  Administered 2013-02-06 – 2013-02-08 (×3): 200 mg via ORAL
  Filled 2013-02-05 (×6): qty 2

## 2013-02-05 MED ORDER — VITAMIN D3 25 MCG (1000 UNIT) PO TABS
1000.0000 [IU] | ORAL_TABLET | Freq: Every day | ORAL | Status: DC
  Administered 2013-02-06 – 2013-02-16 (×11): 1000 [IU] via ORAL
  Filled 2013-02-05 (×12): qty 1

## 2013-02-05 MED ORDER — ONDANSETRON HCL 4 MG/2ML IJ SOLN: 4.0000 mg | Freq: Three times a day (TID) | INTRAMUSCULAR | Status: AC | PRN

## 2013-02-05 MED ORDER — SODIUM CHLORIDE 0.9 % IV SOLN
INTRAVENOUS | Status: DC
  Administered 2013-02-06 – 2013-02-08 (×3): via INTRAVENOUS
  Administered 2013-02-09: 1000 mL via INTRAVENOUS

## 2013-02-05 MED ORDER — DICLOFENAC SODIUM 75 MG PO TBEC
150.0000 mg | DELAYED_RELEASE_TABLET | Freq: Every day | ORAL | Status: DC
  Administered 2013-02-06: 150 mg via ORAL
  Filled 2013-02-05 (×3): qty 2

## 2013-02-05 MED ORDER — SODIUM CHLORIDE 0.9 % IJ SOLN
3.0000 mL | Freq: Two times a day (BID) | INTRAMUSCULAR | Status: DC
  Administered 2013-02-05 – 2013-02-16 (×17): 3 mL via INTRAVENOUS

## 2013-02-05 MED ORDER — SODIUM CHLORIDE 0.9 % IV SOLN: INTRAVENOUS | Status: AC

## 2013-02-05 NOTE — ED Notes (Signed)
PA made aware of BP I liter of NS hung

## 2013-02-05 NOTE — ED Notes (Signed)
Blood transfusion finished without complications.

## 2013-02-05 NOTE — ED Notes (Signed)
Per EMS pt has been c/o GI bleeding started Friday. PCP told pt he had virus to drink fluids and rest. EMS est 300-500cc of bloody stool in toilet. Pt lethargic, pale, c/o dizziness. Pt alert and oriented. Given 2L NS. Pt color improved, sts feels better.

## 2013-02-05 NOTE — Telephone Encounter (Signed)
Mr. [name] calling into office today (02/05/13) requesting an appointment for evaluation of mild bleeding with bowel movements.  Mr. [name] has never been seen at our office, he states his wife's a patient of Dr. Hulen Skains.  Appointment requested to see Dr. Hulen Skains and scheduled for (1st available) 02/20/2013 @ 9:00 am.  Mr. [name] states he will contact his PCP to schedule an appointment for further assessment prior to his appointment with our office.  Mr. [name] was offered an appointment in our Urgent Office but, declined and preferred to wait and see Dr. Hulen Skains.  In the meantime, he will see his PCP

## 2013-02-05 NOTE — ED Notes (Signed)
Pt. Cap refill 3 seconds, color pale, warm to touch. Pt mentating appropriately. MD aware of BP

## 2013-02-05 NOTE — ED Provider Notes (Signed)
History     CSN: [id number]  Arrival date & time 02/05/13  1508   First MD Initiated Contact with Patient 02/05/13 1516      Chief Complaint  Patient presents with  . GI Bleeding    (Consider location/radiation/quality/duration/timing/severity/associated sxs/prior treatment) HPI Comments: [name] is a 72 y/o M with PMHx of aortic stenosis, OA, OSA, gout, arthritis, HLD, morbidly obese presenting to the ED, with family, with GI bleeding. Patient stated that he has history of "polyps" on anus that normally bleed. Patient stated that he felt a "polyp" in his anus last week, stated that it hurt upon palpation. Reported that he had diarrhea on Friday, had 5-6 episodes of loose stool, when looked down in bowel realized that he noticed "pink" discoloration on the sides of the toilet bowl. Patient reported that he had similar episodes of "pink" discoloration on the sides of the on Saturday and Sunday, with every bowel movement that he had. Patient reported that at approximately 1:00PM this afternoon he had a bowel movement and saw bright red blood in his stool and in the toilet bowl. Patient stated that as he was on the toilet bowl he felt extremely weak and felt like he was going to pass out - stated that he had very little energy. Stated that he also felt extremely dizzy. Patient reported that he told his wife to call ED - patient was brought to the ED. As per EMS, EMS estimated approximately 300-500cc of bright red blood in the toilet. Denied headache, visual distortions, neck pain, back pain, chest pain, shortness of breathe, difficulty breathing, abdominal pain, vomiting, nausea, numbness, tingling, syncope, LOC.  Patient had a surgery many years ago where the nerves in his right leg were cut - stated that he has weakness, limited ROM, and numbness to the right leg - baseline for patient.      The history is provided by the patient, the spouse and a relative. No language interpreter was used.     Past Medical History  Diagnosis Date  . Valvular heart disease   . Aortic stenosis   . Osteoarthritis   . Anemia   . OSA (obstructive sleep apnea)   . Bursitis   . Gout   . Arthritis   . Hyperlipidemia   . Morbid obesity   . DDD (degenerative disc disease)   . Mixed sensory-motor polyneuropathy   . Peroneal palsy     significant right foot drop  . Hemorrhoids     hx of bleeding    Past Surgical History  Procedure Laterality Date  . Total knee arthroplasty      right  . Spine surgery    . Laminotomy  1193    c6-t2    Family History  Problem Relation Age of Onset  . Heart disease Mother   . Heart disease Father     History  Substance Use Topics  . Smoking status: Former Smoker    Types: Cigarettes, Pipe    Quit date: 09/20/2006  . Smokeless tobacco: Not on file  . Alcohol Use: Not on file      Review of Systems  Constitutional: Negative for fever, chills and fatigue.  HENT: Negative for ear pain, sore throat, rhinorrhea, trouble swallowing, neck pain and neck stiffness.   Eyes: Negative for photophobia, pain and visual disturbance.  Respiratory: Negative for cough, chest tightness and shortness of breath.   Cardiovascular: Negative for chest pain.  Gastrointestinal: Positive for blood in stool and  anal bleeding. Negative for nausea, vomiting, abdominal pain, diarrhea and constipation.  Genitourinary: Negative for dysuria, urgency, hematuria, flank pain, decreased urine volume, penile swelling, difficulty urinating and testicular pain.  Musculoskeletal: Negative for back pain and arthralgias.  Skin: Negative for rash and wound.  Neurological: Positive for dizziness and weakness. Negative for syncope, speech difficulty, light-headedness, numbness and headaches.  All other systems reviewed and are negative.    Allergies  Review of patient's allergies indicates no known allergies.  Home Medications   Current Outpatient Rx  Name  Route  Sig  Dispense   Refill  . aspirin EC 81 MG tablet   Oral   Take 81 mg by mouth daily.         [name] atorvastatin (LIPITOR) 40 MG tablet   Oral   Take 40 mg by mouth daily.           . cholecalciferol (VITAMIN D) 1000 UNITS tablet   Oral   Take 1,000 Units by mouth daily.         . diclofenac (VOLTAREN) 75 MG EC tablet   Oral   Take 75 mg by mouth 2 (two) times daily.         [name] gabapentin (NEURONTIN) 100 MG capsule   Oral   Take 200 mg by mouth 2 (two) times daily.         [name] lisinopril (PRINIVIL,ZESTRIL) 10 MG tablet   Oral   Take 10 mg by mouth 2 (two) times daily.         . Multiple Vitamin (MULTIVITAMIN WITH MINERALS) TABS   Oral   Take 1 tablet by mouth daily.         . sennosides-docusate sodium (SENOKOT-S) 8.6-50 MG tablet   Oral   Take 1 tablet by mouth 2 (two) times daily.           BP 82/52  Pulse 71  Temp(Src) 98.2 F (36.8 C) (Oral)  Resp 15  Ht 6\' 2"  (1.88 m)  Wt 390 lb (176.903 kg)  BMI 50.05 kg/m2  SpO2 100%  Physical Exam  Nursing note and vitals reviewed. Constitutional: He is oriented to person, place, and time. He appears well-developed and well-nourished. No distress.  HENT:  Head: Normocephalic and atraumatic.  Mouth/Throat: Oropharynx is clear and moist. No oropharyngeal exudate.  Uvula midline, symmetrical elevation  Eyes: Conjunctivae and EOM are normal. Pupils are equal, round, and reactive to light. Right eye exhibits no discharge. Left eye exhibits no discharge.  Neck: Normal range of motion. Neck supple. No tracheal deviation present. No thyromegaly present.  Negative neck stiffness Negative nuchal rigidity Negative lymphadenopathy   Cardiovascular: Normal rate and regular rhythm.  Exam reveals no friction rub.   Murmur (Aortic stenosis) heard. Radial pulses 1+ bilaterally Pedal pulses 1+ bilaterally Negative pitting edema noted  Pulmonary/Chest: Effort normal and breath sounds normal. No respiratory distress. He has no wheezes. He  has no rales. He exhibits no tenderness.  Abdominal: Soft. Bowel sounds are normal. He exhibits no distension and no mass. There is no tenderness. There is no rebound and no guarding.  Obese Negative fluid wave  Genitourinary: Guaiac positive stool.  Anus/Rectum: Negative external hemorrhoids noted. Negative lesions, sores, erythema, inflammation noted to anus. Bright red blood noted to anus. Negative fissures noted. Good sphincter tone. Negative masses, lesions, sores noted to rectum. Positive red blood on glove. Negative melena. Positive hematochezia.   Musculoskeletal: He exhibits no edema and no tenderness.  Decreased ROM to the  right leg - baseline for patient Full ROM to upper and lower extremities bilaterally  Weakness noted to the right leg - baseline for patient Strength 5+/5+ to upper extremities bilaterally Strength 3+/5+ to right lower extremity - baseline for patient Strength 5+/5+ to left lower extremity  Lymphadenopathy:    He has no cervical adenopathy.  Neurological: He is alert and oriented to person, place, and time. No cranial nerve deficit or sensory deficit (Right leg decreased sensation - patient baseline). He exhibits normal muscle tone. Coordination normal. GCS eye subscore is 4. GCS verbal subscore is 5. GCS motor subscore is 6.  Cranial nerves III-XII grossly intact  Decreased sensation to the right lower extremity - baseline for patient Sensation intact to upper extremities bilaterally and left lower extremity    Skin: Skin is warm and dry. No rash noted. He is not diaphoretic. No erythema.  Psychiatric: He has a normal mood and affect. His behavior is normal. Thought content normal.    ED Course  Procedures (including critical care time)  4L NS fluid given - blood pressure continues to drop 2 Units PRBC administered in ED setting  Placed on cardiac monitoring O2 via nasal cannula 2L   CBC Hgb 6.0, Hct 18.4, WBC 13.3  6:19PM Blood pressure  continuously elevating and dropping 2 Units of PRBC ordered  7:39PM Blood pressure 84/42 Denied pain   Labs Reviewed  CBC WITH DIFFERENTIAL - Abnormal; Notable for the following:    WBC 13.3 (*)    RBC 1.98 (*)    Hemoglobin 6.0 (*)    HCT 18.4 (*)    Platelets 142 (*)    Neutrophils Relative % 87 (*)    Neutro Abs 11.6 (*)    Lymphocytes Relative 9 (*)    All other components within normal limits  COMPREHENSIVE METABOLIC PANEL - Abnormal; Notable for the following:    CO2 18 (*)    Glucose, Bld 120 (*)    BUN 49 (*)    Creatinine, Ser 1.54 (*)    Calcium 7.3 (*)    Total Protein 4.4 (*)    Albumin 2.3 (*)    Total Bilirubin 0.2 (*)    GFR calc non Af Amer 44 (*)    GFR calc Af Amer 51 (*)    All other components within normal limits  OCCULT BLOOD, POC DEVICE - Abnormal; Notable for the following:    Fecal Occult Bld POSITIVE (*)    All other components within normal limits  PREPARE RBC (CROSSMATCH)  TYPE AND SCREEN  PREPARE FRESH FROZEN PLASMA  ABO/RH  PREPARE RBC (CROSSMATCH)   No results found.   1. Acute lower gastrointestinal bleeding   2. Acute blood loss anemia   3. Anemia, unspecified   4. Aortic valve disorders       MDM  Patient afebrile, non-tachycardic, non-tachypneic, adqeuate saturation on room air  Patient presenting to ED with GI bleed Blood pressure continuing to drop (lowest in ED 61/40)  Fecal occult positive CBC elevated WBC (13.3), lowered Hgb (6.0), lowered Hct (18.4), lowered platelets (142) CMP lowered CO2 (18), BUN elevated (49), Creatinine elevated (1.54)  Patient given 4 Units of PRBC in ED setting. Cardiac monitoring, pulse oximetry, and blood pressure monitoring. Continuous increase and decrease in blood pressure - blood pressure continues to stay low.   Dr. Rosine Abe spoke with GI, Dr. Collene Mares. Dr. Rosine Abe spoke with admitting team. Patient seen by Dr. Earlie Counts - patient to be admitted. Patient to  be admitted to the hospital,  step-down, for acute lower GI bleeding. Discussed with patient that he is to be admitted - patient understood.         Jamse Mead, PA-C 02/05/13 2125

## 2013-02-05 NOTE — ED Notes (Signed)
EDP notified of HGB of 6.0.

## 2013-02-05 NOTE — H&P (Signed)
[facility] History and Physical  [name] [id number] DOB: [birth year] DOA: 02/05/2013  Referring physician: emergency department PCP: Woody Seller, MD  Specialists:   Chief Complaint: I was bleeding  HPI: [name] is a 72 y.o. male  The patient is a very pleasant 72 year old Caucasian male who presents to the hospital with complaints of acute ward GI bleedingthe started on 02/02/2013. Patient recall being mixed fruit on this day and surely thereafter experienced mild diarrhea with spots of blood.the patient was initially concerned about possible gastroenteritis and decided to wait until the following week to follow up with his primary care provider. During this time, the patient noted intermittent bleeding in his stools. The morning of 02/05/2013,the patient presented to his primary care provider's office with the above complaints. Initially, the patient was told he may have a viral gastroenteritis with recommendations for adequate hydration and rest. However later in the day, the patient noted having ample amounts of bright red blood per rectum (at least 500 cc). This is associated with feelings of lethargy and patient was subsequently brought to the emergency department via EMS.in the emergency department, the patient was noted to have a presenting hemoglobin of 6.0with a presenting blood pressure of 87/42. The patient was started on 2 units of packed red blood cells as well as aggressive IV fluids. The hospital service was subsequently consulted for further recommendations.  Review of Systems: The patient denies anorexia, fever, weight loss,, vision loss, decreased hearing, hoarseness, chest pain, syncope, dyspnea on exertion, peripheral edema, balance deficits, hemoptysis, abdominal pain, melena, , severe indigestion/heartburn, hematuria, incontinence, genital sores, muscle weakness, suspicious skin lesions, transient blindness, difficulty walking, depression, unusual weight  change,enlarged lymph nodes, angioedema, and breast masses.   Review of systems positive for a red blood per rectum  Past Medical History  Diagnosis Date  . Valvular heart disease   . Aortic stenosis   . Osteoarthritis   . Anemia   . OSA (obstructive sleep apnea)   . Bursitis   . Gout   . Arthritis   . Hyperlipidemia   . Morbid obesity   . DDD (degenerative disc disease)   . Mixed sensory-motor polyneuropathy   . Peroneal palsy     significant right foot drop   Past Surgical History  Procedure Laterality Date  . Total knee arthroplasty      right  . Spine surgery    . Laminotomy  1193    c6-t2   Social History:  reports that he quit smoking about 6 years ago. His smoking use included Cigarettes and Pipe. He smoked 0.00 packs per day. He does not have any smokeless tobacco history on file. His alcohol and drug histories are not on file. Patient lives at home   No Known Allergies  Family History  Problem Relation Age of Onset  . Heart disease Mother   . Heart disease Father     Prior to Admission medications   Medication Sig Start Date End Date Taking? Authorizing Provider  atorvastatin (LIPITOR) 40 MG tablet Take 40 mg by mouth daily.      Historical Provider, MD  cholecalciferol (VITAMIN D) 1000 UNITS tablet Take 1,000 Units by mouth daily.    Historical Provider, MD  diclofenac (VOLTAREN) 75 MG EC tablet Take 150 mg by mouth daily.      Historical Provider, MD  gabapentin (NEURONTIN) 100 MG capsule Take 2 tablets by mouth At bedtime. 05/23/11   Historical Provider, MD   Physical Exam: Danley Danker  Vitals:   02/05/13 1726 02/05/13 1730 02/05/13 1731 02/05/13 1733  BP: 77/24 79/51 79/51    Pulse:  70    Temp: 97.8 F (36.6 C)     TempSrc: Oral     Resp:  16 18   Height:      Weight:      SpO2: 97% 100% 100% 100%     General:  Patient is awake in no distress  Eyes: pupils are equal round reactive to light bilateral  ENT: mucous membranes appear moist  Neck:  trachea midline neck supple  Cardiovascular: regular [id number], systolic murmur  Respiratory: normal respiratory effort, no crackles no wheezing  Abdomen: obese soft nontender nondistended positive bowel sound  Skin: no abnormal skin lesions seen  Musculoskeletal: perfused distally, no clubbing or cyanosis  Psychiatric: appears normal  Neurologic: cranial nerves 2-12 grossly intact, strength and sensation intact throughout  Labs on Admission:  Basic Metabolic Panel:  Recent Labs Lab 02/05/13 1546  NA 136  K 4.9  CL 105  CO2 18*  GLUCOSE 120*  BUN 49*  CREATININE 1.54*  CALCIUM 7.3*   Liver Function Tests:  Recent Labs Lab 02/05/13 1546  AST 15  ALT 17  ALKPHOS 50  BILITOT 0.2*  PROT 4.4*  ALBUMIN 2.3*   No results found for this basename: LIPASE, AMYLASE,  in the last 168 hours No results found for this basename: AMMONIA,  in the last 168 hours CBC:  Recent Labs Lab 02/05/13 1546  WBC 13.3*  NEUTROABS 11.6*  HGB 6.0*  HCT 18.4*  MCV 92.9  PLT 142*   Cardiac Enzymes: No results found for this basename: CKTOTAL, CKMB, CKMBINDEX, TROPONINI,  in the last 168 hours  BNP (last 3 results) No results found for this basename: PROBNP,  in the last 8760 hours CBG: No results found for this basename: GLUCAP,  in the last 168 hours  Radiological Exams on Admission: No results found.    Assessment/Plan Principal Problem:   Acute lower gastrointestinal bleeding Active Problems:   OBESITY, MORBID   AORTIC STENOSIS   VALVULAR HEART DISEASE   1. Acute lower GI bleed with anemia: 2 units of PRBCs are currently being transfused. We'll continue this and monitor the patient's H./H. every 6 hours with plans to transfuse as needed to maintain a hemoglobin over 8. The gastroenterology service has been consulted and we will followup with the recommendations. 2. Acute blood loss anemia: as noted above 3. Hypotension secondary to blood loss anemia: will continue with  transfusion as needed and continue IV fluids as tolerated 4. DVT prophylaxis: will continue patient with SCDs given acute blood loss anemia    Code Status: full code (must indicate code status--if unknown or must be presumed, indicate so) Family Communication: the patient, patient's wife, patient's son in the room  Disposition Plan: pending (indicate anticipated LOS)  Time spent: 30 minutes  [facility], [facility] Pager [phone]  If 7PM-7AM, please contact night-coverage [email or web address] Password [facility] 02/05/2013, 6:21 PM

## 2013-02-06 ENCOUNTER — Encounter (HOSPITAL_COMMUNITY): Payer: Self-pay | Admitting: General Surgery

## 2013-02-06 DIAGNOSIS — K922 Gastrointestinal hemorrhage, unspecified: Secondary | ICD-10-CM

## 2013-02-06 DIAGNOSIS — D62 Acute posthemorrhagic anemia: Secondary | ICD-10-CM

## 2013-02-06 DIAGNOSIS — I359 Nonrheumatic aortic valve disorder, unspecified: Secondary | ICD-10-CM

## 2013-02-06 LAB — CBC
HCT: 25 % — ABNORMAL LOW (ref 39.0–52.0)
HCT: 21.1 % — ABNORMAL LOW (ref 39.0–52.0)
HCT: 17.5 % — ABNORMAL LOW (ref 39.0–52.0)
Hemoglobin: 8.4 g/dL — ABNORMAL LOW (ref 13.0–17.0)
Hemoglobin: 7.3 g/dL — ABNORMAL LOW (ref 13.0–17.0)
Hemoglobin: 6.1 g/dL — CL (ref 13.0–17.0)
MCH: 29.9 pg (ref 26.0–34.0)
MCH: 30.4 pg (ref 26.0–34.0)
MCH: 30.3 pg (ref 26.0–34.0)
MCHC: 33.6 g/dL (ref 30.0–36.0)
MCHC: 34.6 g/dL (ref 30.0–36.0)
MCHC: 34.9 g/dL (ref 30.0–36.0)
MCV: 89 fL (ref 78.0–100.0)
MCV: 87.9 fL (ref 78.0–100.0)
MCV: 87.1 fL (ref 78.0–100.0)
Platelets: 156 10*3/uL (ref 150–400)
Platelets: 129 10*3/uL — ABNORMAL LOW (ref 150–400)
Platelets: 52 10*3/uL — ABNORMAL LOW (ref 150–400)
RBC: 2.81 MIL/uL — ABNORMAL LOW (ref 4.22–5.81)
RBC: 2.4 MIL/uL — ABNORMAL LOW (ref 4.22–5.81)
RBC: 2.01 MIL/uL — ABNORMAL LOW (ref 4.22–5.81)
RDW: 17.4 % — ABNORMAL HIGH (ref 11.5–15.5)
RDW: 17.5 % — ABNORMAL HIGH (ref 11.5–15.5)
RDW: 17.7 % — ABNORMAL HIGH (ref 11.5–15.5)
WBC: 13.6 10*3/uL — ABNORMAL HIGH (ref 4.0–10.5)
WBC: 11.2 10*3/uL — ABNORMAL HIGH (ref 4.0–10.5)
WBC: 10.8 10*3/uL — ABNORMAL HIGH (ref 4.0–10.5)

## 2013-02-06 LAB — PREPARE RBC (CROSSMATCH)

## 2013-02-06 MED ORDER — SODIUM CHLORIDE 0.9 % IV SOLN
INTRAVENOUS | Status: DC
  Administered 2013-02-06: 20:00:00 via INTRAVENOUS

## 2013-02-06 MED ORDER — PEG 3350-KCL-NA BICARB-NACL 420 G PO SOLR
4000.0000 mL | Freq: Once | ORAL | Status: AC
  Administered 2013-02-06: 4000 mL via ORAL
  Filled 2013-02-06: qty 4000

## 2013-02-06 MED ORDER — ACETAMINOPHEN 325 MG PO TABS
650.0000 mg | ORAL_TABLET | Freq: Once | ORAL | Status: AC
  Administered 2013-02-06: 650 mg via ORAL
  Filled 2013-02-06: qty 2

## 2013-02-06 NOTE — Progress Notes (Signed)
Utilization Review Completed. 02/06/2013

## 2013-02-06 NOTE — Progress Notes (Signed)
[facility] PROGRESS NOTE  CAP WERNICKE [id number] DOB: Oct 31, 1940 DOA: 02/05/2013 PCP: Woody Seller, MD  Brief history 72 year old male with a history of rectal bleeding but he noted initially on 02/02/2013. The patient decided to wait over the weekend to see his primary care provider. During that time the patient continued to have intermittent blood in his stools. On the morning of 02/05/2013, the patient went to see his primary care provider who told the patient that he likely had a viral gastroenteritis. The patient continued to have rectal bleeding of moderate amount. He presented to emergency department when he began feeling dizzy and lethargic. In emergency department, the patient was found to have a hemoglobin of 6.0. The patient has received 6 units PRBCs as well as IV fluid hydration. The patient's BP remains soft and hemoglobin continues to trend down. Both GI and general surgery have been consulted to see the patient. Assessment/Plan: Acute lower GI bleed- -Patient says that he has had 2 further bloody stools since hospitalization -GI has been consulted -Patient has received 4 units PRBC---> hemoglobin Went from 6.0 to 8.4 -Check coags -Continue to monitor serial H&H -Keep the patient in stepdown and transfuse as necessary -Patient states he has never had colonoscopy, but has had a sigmoidoscopy over 5 years ago, results unknown -Patient takes diclofenac for arthritis -Hold aspirin -Hemoglobin 7.3 late morning (02/06/13) after 4 units PRBC, consult surgery for evaluation Acute blood loss anemia -Secondary to GI bleed Hypotension -Blood pressures remained soft -IV fluids judiciously -Hold lisinopril   aortic stenosis  -05/30/2010 TEE shows severe aortic stenosis, ejection fraction 60%  -Restart lisinopril when able       Family Communication:   wife at beside Disposition Plan:   Home when medically stable       Procedures/Studies:  No results  found.      Subjective:  patient no longer feels dizzy. Denies any fevers, chills, chest discomfort, shortness of breath, nausea, vomiting, abdominal pain. He had 2 loose bowel movements with blood today. No dysuria or hematuria.   Objective: Filed Vitals:   02/06/13 0600 02/06/13 0700 02/06/13 0805 02/06/13 1145  BP: 104/61 91/55 98/54  112/50  Pulse: 73 75 77 79  Temp: 97.9 F (36.6 C)  97.8 F (36.6 C) 97.9 F (36.6 C)  TempSrc: Axillary  Oral Oral  Resp:   18 12  Height:      Weight:      SpO2: 96% 99% 98% 100%    Intake/Output Summary (Last 24 hours) at 02/06/13 1438 Last data filed at 02/06/13 1021  Gross per 24 hour  Intake 2380.42 ml  Output   1000 ml  Net 1380.42 ml   Weight change:  Exam:   General:  Pt is alert, follows commands appropriately, not in acute distress  HEENT: No icterus, No thrush,[place]/AT  Cardiovascular: RRR, S1/S2, no rubs, no gallops  Respiratory: CTA bilaterally, no wheezing, no crackles, no rhonchi  Abdomen: Soft/+BS, non tender, non distended, no guarding  Extremities: trace edema, No lymphangitis, No petechiae, No rashes, no synovitis  Data Reviewed: Basic Metabolic Panel:  Recent Labs Lab 02/05/13 1546  NA 136  K 4.9  CL 105  CO2 18*  GLUCOSE 120*  BUN 49*  CREATININE 1.54*  CALCIUM 7.3*   Liver Function Tests:  Recent Labs Lab 02/05/13 1546  AST 15  ALT 17  ALKPHOS 50  BILITOT 0.2*  PROT 4.4*  ALBUMIN 2.3*   No results found for this basename: LIPASE, AMYLASE,  in the last 168 hours No results found for this basename: AMMONIA,  in the last 168 hours CBC:  Recent Labs Lab 02/05/13 1546 02/05/13 2211 02/06/13 0450 02/06/13 1050  WBC 13.3* 13.1* 13.6* 11.2*  NEUTROABS 11.6*  --   --   --   HGB 6.0* 7.3* 8.4* 7.3*  HCT 18.4* 21.5* 25.0* 21.1*  MCV 92.9 90.7 89.0 87.9  PLT 142* 145* 156 129*   Cardiac Enzymes: No results found for this basename: CKTOTAL, CKMB, CKMBINDEX, TROPONINI,  in the last 168  hours BNP: No components found with this basename: POCBNP,  CBG: No results found for this basename: GLUCAP,  in the last 168 hours  Recent Results (from the past 240 hour(s))  MRSA PCR SCREENING     Status: None   Collection Time    02/05/13  9:53 PM      Result Value Range Status   MRSA by PCR NEGATIVE  NEGATIVE Final   Comment:            The GeneXpert MRSA Assay (FDA     approved for NASAL specimens     only), is one component of a     comprehensive MRSA colonization     surveillance program. It is not     intended to diagnose MRSA     infection nor to guide or     monitor treatment for     MRSA infections.     Scheduled Meds: . sodium chloride   Intravenous STAT  . atorvastatin  40 mg Oral Daily  . cholecalciferol  1,000 Units Oral Daily  . diclofenac  150 mg Oral Daily  . gabapentin  200 mg Oral QHS  . sodium chloride  3 mL Intravenous Q12H   Continuous Infusions: . sodium chloride 125 mL/hr at 02/06/13 1043     Gayle Martinez, DO  [facility] Pager [phone]  If 7PM-7AM, please contact night-coverage [email or web address] Password [email or web address] 02/06/2013, 2:38 PM   LOS: 1 day

## 2013-02-06 NOTE — Consult Note (Signed)
[name] [birth year]  [id number].   Primary Care MD: Dr. Redmond Pulling Requesting MD: Dr. Shanon Brow Tat Chief Complaint/Reason for Consult: GI bleed HPI: This is a 72 yo morbidly obese male who has multiple medical problems who has some hemorrhoids that began bleeding mildly late last week.  He states this resolved, but after eating a whole lot of fruit he started having some diarrhea that had small amounts of BRB interspersed.  No pain with this.  This persisted over the weekend with intermittent diarrhea.  He saw his PCP on Monday, who felt it may be gastroenteritis, but asked for stool samples and checked labs.  Later that day when he got home, he sat on the toilet and had a large BRB stool and felt syncopal.  EMS was called and he was transported to the hospital where his hgb was found to be 6.  He was admitted and has a total of 4 units of pRBCs since last night.  The patient has now started having dark melanotic type stools.  He states that he takes diclofenac twice a day for years.  He denies any abdominal pain, but admits to occasional GERD symptoms.  He had a Flex Sig done 7-8 yrs ago by [facility].  We have been asked to see the patient for further evaluation.  Review of Systems: Please see HPI, otherwise he admits to neck pain, paresthesias of his RLE, has to use a walker to mobilize, aortic stenosis with murmur.  He has lost 22lbs in the last several weeks intentionally on the [employer]risystem weight loss program.  Otherwise all other systems are negative.  Family History  Problem Relation Age of Onset  . Heart disease Mother   . Heart disease Father   no cancers  Past Medical History  Diagnosis Date  . Valvular heart disease   . Aortic stenosis   . Osteoarthritis   . Anemia   . OSA (obstructive sleep apnea)   . Bursitis   . Gout   . Arthritis   . Hyperlipidemia   . Morbid obesity   . DDD (degenerative disc disease)   . Mixed sensory-motor polyneuropathy   . Peroneal palsy    significant right foot drop  . Hemorrhoids     hx of bleeding    Past Surgical History  Procedure Laterality Date  . Total knee arthroplasty      right  . Spine surgery    . Laminotomy  1193    c6-t2  . Hernia repair      umbilical hernia  . Knee surgery Right     multiple knee surgeries due to complication of R TKA    Social History:  reports that he quit smoking about 6 years ago. His smoking use included Cigarettes and Pipe. He smoked 0.00 packs per day. He does not have any smokeless tobacco history on file. He reports that he does not drink alcohol or use illicit drugs.  Allergies: No Known Allergies  Medications Prior to Admission  Medication Sig Dispense Refill  . aspirin EC 81 MG tablet Take 81 mg by mouth daily.      [name] atorvastatin (LIPITOR) 40 MG tablet Take 40 mg by mouth daily.        . cholecalciferol (VITAMIN D) 1000 UNITS tablet Take 1,000 Units by mouth daily.      . diclofenac (VOLTAREN) 75 MG EC tablet Take 75 mg by mouth 2 (two) times daily.      [name] gabapentin (NEURONTIN) 100  MG capsule Take 200 mg by mouth 2 (two) times daily.      [name] lisinopril (PRINIVIL,ZESTRIL) 10 MG tablet Take 10 mg by mouth 2 (two) times daily.      . Multiple Vitamin (MULTIVITAMIN WITH MINERALS) TABS Take 1 tablet by mouth daily.      . sennosides-docusate sodium (SENOKOT-S) 8.6-50 MG tablet Take 1 tablet by mouth 2 (two) times daily.        Blood pressure 120/63, pulse 82, temperature 98.1 F (36.7 C), temperature source Oral, resp. rate 20, height 6\' 2"  (1.88 m), weight 375 lb 10.6 oz (170.4 kg), SpO2 100.00%. Physical Exam: General: pleasant, morbidly obese white male who is laying in bed in NAD HEENT: head is normocephalic, atraumatic.  Sclera are noninjected.  PERRL.  Ears and nose without any masses or lesions.  Mouth is pink and moist Heart: regular, rate, and rhythm.  Normal s1,s2. No obvious  gallops, or rubs noted.  Palpable radial and pedal pulses bilaterally.  He has a  systolic murmur noted secondary to his aortic stenosis.  Very loud. Lungs: CTAB, no wheezes, rhonchi, or rales noted.  Respiratory effort nonlabored Abd: soft, NT, ND, +BS, no masses. Unable to assess organomegaly due to body habitus.  He has a completely reducible umbilical hernia. Rectal: DRE is performed.  There is some bloody staining around his anus with visual exam.  DRE reveals a palpable internal hemorrhoid, but no other findings.  There was blood on my finger after extraction. MS: all 4 extremities are symmetrical with no cyanosis, clubbing, or edema. Skin: warm and dry with no masses, lesions, or rashes Psych: A&Ox3 with an appropriate affect.    Results for orders placed during the hospital encounter of 02/05/13 (from the past 48 hour(s))  CBC WITH DIFFERENTIAL     Status: Abnormal   Collection Time    02/05/13  3:46 PM      Result Value Range   WBC 13.3 (*) 4.0 - 10.5 K/uL   RBC 1.98 (*) 4.22 - 5.81 MIL/uL   Hemoglobin 6.0 (*) 13.0 - 17.0 g/dL   Comment: REPEATED TO VERIFY     CRITICAL RESULT CALLED TO, READ BACK BY AND VERIFIED WITH:     A MCKEOWN,RN 1630 02/05/13 D BRADLEY   HCT 18.4 (*) 39.0 - 52.0 %   MCV 92.9  78.0 - 100.0 fL   MCH 30.3  26.0 - 34.0 pg   MCHC 32.6  30.0 - 36.0 g/dL   RDW 15.2  11.5 - 15.5 %   Platelets 142 (*) 150 - 400 K/uL   Neutrophils Relative % 87 (*) 43 - 77 %   Neutro Abs 11.6 (*) 1.7 - 7.7 K/uL   Lymphocytes Relative 9 (*) 12 - 46 %   Lymphs Abs 1.2  0.7 - 4.0 K/uL   Monocytes Relative 4  3 - 12 %   Monocytes Absolute 0.6  0.1 - 1.0 K/uL   Eosinophils Relative 0  0 - 5 %   Eosinophils Absolute 0.0  0.0 - 0.7 K/uL   Basophils Relative 0  0 - 1 %   Basophils Absolute 0.0  0.0 - 0.1 K/uL  COMPREHENSIVE METABOLIC PANEL     Status: Abnormal   Collection Time    02/05/13  3:46 PM      Result Value Range   Sodium 136  135 - 145 mEq/L   Potassium 4.9  3.5 - 5.1 mEq/L   Chloride 105  96 - 112 mEq/L  CO2 18 (*) 19 - 32 mEq/L   Glucose, Bld  120 (*) 70 - 99 mg/dL   BUN 49 (*) 6 - 23 mg/dL   Creatinine, Ser 1.54 (*) 0.50 - 1.35 mg/dL   Calcium 7.3 (*) 8.4 - 10.5 mg/dL   Total Protein 4.4 (*) 6.0 - 8.3 g/dL   Albumin 2.3 (*) 3.5 - 5.2 g/dL   AST 15  0 - 37 U/L   ALT 17  0 - 53 U/L   Alkaline Phosphatase 50  39 - 117 U/L   Total Bilirubin 0.2 (*) 0.3 - 1.2 mg/dL   GFR calc non Af Amer 44 (*) >90 mL/min   GFR calc Af Amer 51 (*) >90 mL/min   Comment:            The eGFR has been calculated     using the CKD EPI equation.     This calculation has not been     validated in all clinical     situations.     eGFR's persistently     <90 mL/min signify     possible Chronic Kidney Disease.  PREPARE RBC (CROSSMATCH)     Status: None   Collection Time    02/05/13  4:00 PM      Result Value Range   Order Confirmation ORDER PROCESSED BY BLOOD BANK    TYPE AND SCREEN     Status: None   Collection Time    02/05/13  4:00 PM      Result Value Range   ABO/RH(D) O POS     Antibody Screen NEG     Sample Expiration 02/08/2013     Unit Number [id number]     Blood Component Type RED CELLS,LR     Unit division 00     Status of Unit REL FROM [facility]     Unit tag comment VERBAL ORDERS PER DR STEINL     Transfusion Status OK TO TRANSFUSE     Crossmatch Result COMPATIBLE     Unit Number [id number]     Blood Component Type RED CELLS,LR     Unit division 00     Status of Unit ISSUED,FINAL     Unit tag comment VERBAL ORDERS PER DR STEINL     Transfusion Status OK TO TRANSFUSE     Crossmatch Result COMPATIBLE     Unit Number [id number]     Blood Component Type RED CELLS,LR     Unit division 00     Status of Unit ISSUED,FINAL     Transfusion Status OK TO TRANSFUSE     Crossmatch Result Compatible     Unit Number [id number]     Blood Component Type RED CELLS,LR     Unit division 00     Status of Unit ISSUED,FINAL     Transfusion Status OK TO TRANSFUSE     Crossmatch Result Compatible     Unit Number [id number]      Blood Component Type RED CELLS,LR     Unit division 00     Status of Unit ISSUED     Transfusion Status OK TO TRANSFUSE     Crossmatch Result Compatible    ABO/RH     Status: None   Collection Time    02/05/13  4:00 PM      Result Value Range   ABO/RH(D) O POS    PREPARE FRESH FROZEN PLASMA     Status: None   Collection Time    02/05/13  5:30 PM      Result Value Range   Unit Number [id number]     Blood Component Type THAWED PLASMA     Unit division 00     Status of Unit REL FROM [facility]     Unit tag comment VERBAL ORDERS PER DR STEINL     Transfusion Status OK TO TRANSFUSE     Unit Number [id number]     Blood Component Type THAWED PLASMA     Unit division 00     Status of Unit REL FROM [facility]     Unit tag comment VERBAL ORDERS PER DR STEINL     Transfusion Status OK TO TRANSFUSE    OCCULT BLOOD, POC DEVICE     Status: Abnormal   Collection Time    02/05/13  5:43 PM      Result Value Range   Fecal Occult Bld POSITIVE (*) NEGATIVE  PREPARE RBC (CROSSMATCH)     Status: None   Collection Time    02/05/13  7:00 PM      Result Value Range   Order Confirmation ORDER PROCESSED BY BLOOD BANK    MRSA PCR SCREENING     Status: None   Collection Time    02/05/13  9:53 PM      Result Value Range   MRSA by PCR NEGATIVE  NEGATIVE   Comment:            The GeneXpert MRSA Assay (FDA     approved for NASAL specimens     only), is one component of a     comprehensive MRSA colonization     surveillance program. It is not     intended to diagnose MRSA     infection nor to guide or     monitor treatment for     MRSA infections.  CBC     Status: Abnormal   Collection Time    02/05/13 10:11 PM      Result Value Range   WBC 13.1 (*) 4.0 - 10.5 K/uL   RBC 2.37 (*) 4.22 - 5.81 MIL/uL   Hemoglobin 7.3 (*) 13.0 - 17.0 g/dL   Comment: POST TRANSFUSION SPECIMEN   HCT 21.5 (*) 39.0 - 52.0 %   MCV 90.7  78.0 - 100.0 fL   MCH 30.8  26.0 - 34.0 pg   MCHC 34.0  30.0 - 36.0 g/dL   RDW  15.2  11.5 - 15.5 %   Platelets 145 (*) 150 - 400 K/uL  CBC     Status: Abnormal   Collection Time    02/06/13  4:50 AM      Result Value Range   WBC 13.6 (*) 4.0 - 10.5 K/uL   RBC 2.81 (*) 4.22 - 5.81 MIL/uL   Hemoglobin 8.4 (*) 13.0 - 17.0 g/dL   HCT 25.0 (*) 39.0 - 52.0 %   MCV 89.0  78.0 - 100.0 fL   MCH 29.9  26.0 - 34.0 pg   MCHC 33.6  30.0 - 36.0 g/dL   RDW 17.4 (*) 11.5 - 15.5 %   Platelets 156  150 - 400 K/uL  CBC     Status: Abnormal   Collection Time    02/06/13 10:50 AM      Result Value Range   WBC 11.2 (*) 4.0 - 10.5 K/uL   RBC 2.40 (*) 4.22 - 5.81 MIL/uL   Hemoglobin 7.3 (*) 13.0 - 17.0 g/dL   HCT 21.1 (*) 39.0 - 52.0 %  MCV 87.9  78.0 - 100.0 fL   MCH 30.4  26.0 - 34.0 pg   MCHC 34.6  30.0 - 36.0 g/dL   RDW 17.5 (*) 11.5 - 15.5 %   Platelets 129 (*) 150 - 400 K/uL   No results found.     Assessment/Plan 1. GI bleed, source unknown Patient Active Problem List   Diagnosis Date Noted  . Acute lower gastrointestinal bleeding 02/05/2013    Class: Acute  . Acute blood loss anemia 02/05/2013    Class: Acute  . EDEMA 04/15/2010  . HYPERCHOLESTEROLEMIA 04/14/2010  . GOUT 04/14/2010  . ANEMIA 04/14/2010  . AORTIC STENOSIS 04/14/2010  . VALVULAR HEART DISEASE 04/14/2010  . OSTEOARTHRITIS 04/14/2010  . ARTHRITIS 04/14/2010  . DEGENERATIVE DISC DISEASE 04/14/2010  . BURSITIS 04/14/2010  . OBESITY, MORBID 03/21/2010  . OBSTRUCTIVE SLEEP APNEA 02/19/2008   Plan: 1. The patient needs a GI consult for likely an upper and lower endoscopy.  The patient has never had a full colonoscopy.  He also has been taking diclofenac for years.  He states he bleeds very easily and has actually been worked up for a bleeding disorder, which he was found to not have.  Right now the patient appears stable.  There is nothing acutely surgical to intervene on.  Hopefully the patient will cease bleeding on his own; however, if he doesn't, we need a location and source before anything  surgical can be done.  We will follow along with you.  Agree with q6 h h/h and transfuse as needed.  Amun Stemm E 02/06/2013, 3:57 PM Pager: [phone]

## 2013-02-06 NOTE — Progress Notes (Signed)
CRITICAL VALUE ALERT  Critical value received:Hgb 6.1  Date of notification:  02/06/13  Time of notification:  [id number]  Critical value read back:yes  Nurse who received alert:  Marta Lamas RN  MD notified (1st page):  Dr. Carles Collet  Time of first page:  [phone]  MD notified (2nd page):  Time of second page:  Responding MD:  Dr Carles Collet  Time MD responded:  1700

## 2013-02-06 NOTE — Consult Note (Signed)
Reason for Consult: Hematochezia Referring Physician: [facility]  [name] HPI: This is a 72 year old male admitted for hematochezia.  His bleeding started approximately one week ago and he attributed the bleeding to his hemorrhoids.  He has a long history of bleeding hemorrhoids/thrombosed hemorrhoids that required lancing from time to time.  At first he felt that his hemorrhoids were swollen and if it worsened he would present to the ER, however, the following day he felt that it spontaneously ruptured.  He bled that day and then the next day he did not notice any further bleeding.  This past Friday he started to have bloody diarrhea, which was new for him.  Previously he never saw blood mixed with his stools.  He thought the bleeding would arrest spontaneously, but he continued to bleed.  As a result he presented to the ER.  He denies having a prior colonoscopy in the past, but many years ago he had a sigmoidoscopy for screening.  Past Medical History  Diagnosis Date  . Valvular heart disease   . Aortic stenosis   . Osteoarthritis   . Anemia   . OSA (obstructive sleep apnea)   . Bursitis   . Gout   . Arthritis   . Hyperlipidemia   . Morbid obesity   . DDD (degenerative disc disease)   . Mixed sensory-motor polyneuropathy   . Peroneal palsy     significant right foot drop  . Hemorrhoids     hx of bleeding    Past Surgical History  Procedure Laterality Date  . Total knee arthroplasty      right  . Spine surgery    . Laminotomy  1193    c6-t2  . Hernia repair      umbilical hernia  . Knee surgery Right     multiple knee surgeries due to complication of R TKA    Family History  Problem Relation Age of Onset  . Heart disease Mother   . Heart disease Father     Social History:  reports that he quit smoking about 6 years ago. His smoking use included Cigarettes and Pipe. He smoked 0.00 packs per day. He does not have any smokeless tobacco history on file. He  reports that he does not drink alcohol or use illicit drugs.  Allergies: No Known Allergies  Medications: Reviewed  Results for orders placed during the hospital encounter of 02/05/13 (from the past 24 hour(s))  PREPARE RBC (CROSSMATCH)     Status: None   Collection Time    02/05/13  7:00 PM      Result Value Range   Order Confirmation ORDER PROCESSED BY BLOOD BANK    MRSA PCR SCREENING     Status: None   Collection Time    02/05/13  9:53 PM      Result Value Range   MRSA by PCR NEGATIVE  NEGATIVE  CBC     Status: Abnormal   Collection Time    02/05/13 10:11 PM      Result Value Range   WBC 13.1 (*) 4.0 - 10.5 K/uL   RBC 2.37 (*) 4.22 - 5.81 MIL/uL   Hemoglobin 7.3 (*) 13.0 - 17.0 g/dL   HCT 21.5 (*) 39.0 - 52.0 %   MCV 90.7  78.0 - 100.0 fL   MCH 30.8  26.0 - 34.0 pg   MCHC 34.0  30.0 - 36.0 g/dL   RDW 15.2  11.5 - 15.5 %   Platelets  145 (*) 150 - 400 K/uL  CBC     Status: Abnormal   Collection Time    02/06/13  4:50 AM      Result Value Range   WBC 13.6 (*) 4.0 - 10.5 K/uL   RBC 2.81 (*) 4.22 - 5.81 MIL/uL   Hemoglobin 8.4 (*) 13.0 - 17.0 g/dL   HCT 25.0 (*) 39.0 - 52.0 %   MCV 89.0  78.0 - 100.0 fL   MCH 29.9  26.0 - 34.0 pg   MCHC 33.6  30.0 - 36.0 g/dL   RDW 17.4 (*) 11.5 - 15.5 %   Platelets 156  150 - 400 K/uL  CBC     Status: Abnormal   Collection Time    02/06/13 10:50 AM      Result Value Range   WBC 11.2 (*) 4.0 - 10.5 K/uL   RBC 2.40 (*) 4.22 - 5.81 MIL/uL   Hemoglobin 7.3 (*) 13.0 - 17.0 g/dL   HCT 21.1 (*) 39.0 - 52.0 %   MCV 87.9  78.0 - 100.0 fL   MCH 30.4  26.0 - 34.0 pg   MCHC 34.6  30.0 - 36.0 g/dL   RDW 17.5 (*) 11.5 - 15.5 %   Platelets 129 (*) 150 - 400 K/uL  CBC     Status: Abnormal   Collection Time    02/06/13  4:14 PM      Result Value Range   WBC 10.8 (*) 4.0 - 10.5 K/uL   RBC 2.01 (*) 4.22 - 5.81 MIL/uL   Hemoglobin 6.1 (*) 13.0 - 17.0 g/dL   HCT 17.5 (*) 39.0 - 52.0 %   MCV 87.1  78.0 - 100.0 fL   MCH 30.3  26.0 - 34.0 pg    MCHC 34.9  30.0 - 36.0 g/dL   RDW 17.7 (*) 11.5 - 15.5 %   Platelets 52 (*) 150 - 400 K/uL  PREPARE RBC (CROSSMATCH)     Status: None   Collection Time    02/06/13  5:00 PM      Result Value Range   Order Confirmation ORDER PROCESSED BY BLOOD BANK       No results found.  ROS:  As stated above in the HPI otherwise negative.  Blood pressure 120/63, pulse 82, temperature 98.1 F (36.7 C), temperature source Oral, resp. rate 20, height 6\' 2"  (1.88 m), weight 375 lb 10.6 oz (170.4 kg), SpO2 100.00%.    PE: Gen: NAD, Alert and Oriented HEENT:  [place]/AT, EOMI Neck: Supple, no LAD Lungs: CTA Bilaterally CV: RRR without M/G/R ABM: Soft, NTND, +BS Ext: No C/C/E  Assessment/Plan: 1) Hematochezia. 2) Anemia. 3) Morbid obesity. 4) Hypotension.   The rectal examination was significant for fresh blood.  I was not able to identify an external hemorrhoidal source.  No masses or other irregularities palpated.  The patient may have a diverticular bleed, severe hemorrhoidal bleeding, or even a solitary rectal ulcer with a visible vessel.  Further evaluation is required.  Plan: 1) Colonoscopy tomorrow. 2) Follow HGB and transfuse as necessary.  Mekesha Solomon D 02/06/2013, 6:23 PM

## 2013-02-06 NOTE — ED Provider Notes (Signed)
Medical screening examination/treatment/procedure(s) were conducted as a shared visit with non-physician practitioner(s) and myself.  I personally evaluated the patient during the encounter Pt c/o several episodes gi bleeding over past couple days, increased this am. Denies prior colonoscopy. Denies hx diverticula, avm, pud, or prior significant gi bleeding. Had prior hemorrhoid/minimal bleeding. Denies coumadin or other anticoag use. No regular nsaid use. Today pt felt faint/lightheaded.    pts bp low. ?some v low readings, as w bp 60/40, pt conscious and alert, hr 70, palp radial pulse, strong fem pulse, states feeling improved w ivf/starting blood, and manual recheck 90/60.   w bp low, gi bleed, second iv line established, initial ivf boluses re low bp, and emergency release blood requested, transfused 2 units, w 2 additional units t/c blood on standby.    Continuous bp rechecks and monitoring. Gi consulted re gi bleed/hypotension, Dr Collene Mares will see in ED.  Called med service re stepdown admit/gi bleeding.   Pt denies any abd pain. abd soft nt. w ivf and transfusion, pt states feels much improved, no recurrent faintness or dizziness. bp improve on recheck 99/50.   CRITICAL CARE Performed by: Mirna Mires Total critical care time: 35 Critical care time was exclusive of separately billable procedures and treating other patients. Critical care was necessary to treat or prevent imminent or life-threatening deterioration. Critical care was time spent personally by me on the following activities: development of treatment plan with patient and/or surrogate as well as nursing, discussions with consultants, evaluation of patient's response to treatment, examination of patient, obtaining history from patient or surrogate, ordering and performing treatments and interventions, ordering and review of laboratory studies, ordering and review of radiographic studies, pulse oximetry and re-evaluation of patient's  condition.  Admitting dx: Gi bleeding hypotension  Mirna Mires, MD 02/06/13 [phone]

## 2013-02-06 NOTE — Progress Notes (Signed)
NP on call made aware of low blood pressure 74/54,will continue to monitor

## 2013-02-06 NOTE — Consult Note (Signed)
Agree with above 

## 2013-02-07 ENCOUNTER — Encounter (HOSPITAL_COMMUNITY)
Admission: EM | Disposition: A | Discharge status: Skilled Nursing Facility | Payer: Self-pay | Source: Home / Self Care | Attending: Internal Medicine

## 2013-02-07 ENCOUNTER — Encounter (HOSPITAL_COMMUNITY): Payer: Self-pay | Admitting: *Deleted

## 2013-02-07 DIAGNOSIS — K921 Melena: Secondary | ICD-10-CM

## 2013-02-07 HISTORY — PX: COLONOSCOPY: SHX5424

## 2013-02-07 HISTORY — PX: ESOPHAGOGASTRODUODENOSCOPY: SHX5428

## 2013-02-07 LAB — COMPREHENSIVE METABOLIC PANEL
ALT: 16 U/L (ref 0–53)
AST: 19 U/L (ref 0–37)
Albumin: 2.2 g/dL — ABNORMAL LOW (ref 3.5–5.2)
Alkaline Phosphatase: 43 U/L (ref 39–117)
BUN: 36 mg/dL — ABNORMAL HIGH (ref 6–23)
CO2: 20 mEq/L (ref 19–32)
Calcium: 7.2 mg/dL — ABNORMAL LOW (ref 8.4–10.5)
Chloride: 108 mEq/L (ref 96–112)
Creatinine, Ser: 1.11 mg/dL (ref 0.50–1.35)
GFR calc Af Amer: 76 mL/min — ABNORMAL LOW (ref >90)
GFR calc non Af Amer: 65 mL/min — ABNORMAL LOW (ref >90)
Glucose, Bld: 105 mg/dL — ABNORMAL HIGH (ref 70–99)
Potassium: 4.1 mEq/L (ref 3.5–5.1)
Sodium: 138 mEq/L (ref 135–145)
Total Bilirubin: 0.5 mg/dL (ref 0.3–1.2)
Total Protein: 3.8 g/dL — ABNORMAL LOW (ref 6.0–8.3)

## 2013-02-07 LAB — CBC
HCT: 23 % — ABNORMAL LOW (ref 39.0–52.0)
HCT: 20 % — ABNORMAL LOW (ref 39.0–52.0)
Hemoglobin: 8 g/dL — ABNORMAL LOW (ref 13.0–17.0)
Hemoglobin: 7 g/dL — ABNORMAL LOW (ref 13.0–17.0)
MCH: 30.1 pg (ref 26.0–34.0)
MCH: 30 pg (ref 26.0–34.0)
MCHC: 34.8 g/dL (ref 30.0–36.0)
MCHC: 35 g/dL (ref 30.0–36.0)
MCV: 86.5 fL (ref 78.0–100.0)
MCV: 85.8 fL (ref 78.0–100.0)
Platelets: 149 10*3/uL — ABNORMAL LOW (ref 150–400)
Platelets: 127 10*3/uL — ABNORMAL LOW (ref 150–400)
RBC: 2.66 MIL/uL — ABNORMAL LOW (ref 4.22–5.81)
RBC: 2.33 MIL/uL — ABNORMAL LOW (ref 4.22–5.81)
RDW: 17 % — ABNORMAL HIGH (ref 11.5–15.5)
RDW: 17.4 % — ABNORMAL HIGH (ref 11.5–15.5)
WBC: 16.5 10*3/uL — ABNORMAL HIGH (ref 4.0–10.5)
WBC: 13.3 10*3/uL — ABNORMAL HIGH (ref 4.0–10.5)

## 2013-02-07 LAB — PREPARE RBC (CROSSMATCH)

## 2013-02-07 SURGERY — COLONOSCOPY
Anesthesia: Moderate Sedation

## 2013-02-07 MED ORDER — ONDANSETRON HCL 4 MG/2ML IJ SOLN
4.0000 mg | Freq: Four times a day (QID) | INTRAMUSCULAR | Status: DC | PRN
  Administered 2013-02-07: 4 mg via INTRAVENOUS
  Filled 2013-02-07: qty 2

## 2013-02-07 MED ORDER — SODIUM CHLORIDE 0.9 % IV SOLN
Freq: Once | INTRAVENOUS | Status: AC
  Administered 2013-02-07: 500 mL via INTRAVENOUS

## 2013-02-07 MED ORDER — MIDAZOLAM HCL 5 MG/5ML IJ SOLN
INTRAMUSCULAR | Status: DC | PRN
  Administered 2013-02-07: 2 mg via INTRAVENOUS
  Administered 2013-02-09: 1 mg via INTRAVENOUS
  Administered 2013-02-09: 2 mg via INTRAVENOUS
  Administered 2013-02-09 (×2): 1 mg via INTRAVENOUS
  Administered 2013-02-09: 2 mg via INTRAVENOUS

## 2013-02-07 MED ORDER — FENTANYL CITRATE 0.05 MG/ML IJ SOLN
INTRAMUSCULAR | Status: AC
  Filled 2013-02-07: qty 2

## 2013-02-07 MED ORDER — MIDAZOLAM HCL 5 MG/ML IJ SOLN
INTRAMUSCULAR | Status: AC
  Filled 2013-02-07: qty 2

## 2013-02-07 MED ORDER — WHITE PETROLATUM GEL
Status: AC
  Filled 2013-02-07: qty 5

## 2013-02-07 MED ORDER — FENTANYL CITRATE 0.05 MG/ML IJ SOLN
INTRAMUSCULAR | Status: DC | PRN
  Administered 2013-02-07 – 2013-02-09 (×3): 25 ug via INTRAVENOUS

## 2013-02-07 MED ORDER — MORPHINE SULFATE 2 MG/ML IJ SOLN
2.0000 mg | INTRAMUSCULAR | Status: DC | PRN
  Administered 2013-02-14: 2 mg via INTRAVENOUS
  Filled 2013-02-07: qty 1

## 2013-02-07 NOTE — Progress Notes (Addendum)
[facility] PROGRESS NOTE  [name] [id number] DOB: 16-Feb-1941 DOA: 02/05/2013 PCP: Woody Seller, MD  Brief history  72 year old male with a history of rectal bleeding but he noted initially on 02/02/2013. The patient decided to wait over the weekend to see his primary care provider. During that time the patient continued to have intermittent blood in his stools. On the morning of 02/05/2013, the patient went to see his primary care provider who told the patient that he likely had a viral gastroenteritis. The patient continued to have rectal bleeding of moderate amount. He presented to emergency department when he began feeling dizzy and lethargic. In emergency department, the patient was found to have a hemoglobin of 6.0. He initially received 4 units PRBCs. The patient continued to have hematochezia and his hemoglobin trended down to 6.1 less than 24 hours after the initial 4 units PRBCs. An additional 3 units of PRBCs was transfused on 02/06/2013. The patient's BP remains soft and hemoglobin continues to trend down on 02/06/13. Both GI and general surgery have been consulted to see the patient.  Assessment/Plan:  Acute lower GI bleed-  -Patient says that he still has bloody stools last evening -colonoscopy planned 02/07/13 -appreciate GI and general surgery followup -Patient has received 4 units PRBC---> hemoglobin Went from 6.0 to 8.4  -transfused additional 3 unitsPRBC on 02/06/2013 (received a total of 7 units) -Continue to monitor serial H&H  -Keep the patient in stepdown and transfuse as necessary  -Patient states he has never had colonoscopy, but has had a sigmoidoscopy over 5 years ago, results unknown  -Patient takes diclofenac for arthritis  -Hold aspirin, diclofenac Acute blood loss anemia  -Secondary to GI bleed  Hypotension  -Blood pressures remained soft  -IV fluids judiciously  -Hold lisinopril  aortic stenosis  -05/30/2010 TEE shows severe aortic  stenosis, ejection fraction 60%  -Restart lisinopril when able  Family Communication: wife at beside  Disposition Plan: Home when medically stable   Procedures/Studies:  No results found.      Subjective: Patient denies fevers, chills, chest pain, shortness breath, nausea, vomiting, diarrhea, dizziness, syncope.  Objective: Filed Vitals:   02/07/13 0300 02/07/13 0330 02/07/13 0400 02/07/13 0700  BP: 115/45 121/71 101/53 103/56  Pulse: 93 94 79 79  Temp:  98.1 F (36.7 C)  98.2 F (36.8 C)  TempSrc:  Oral  Oral  Resp:  19  20  Height:      Weight:  174.7 kg (385 lb 2.3 oz)    SpO2: 92% 100% 99% 97%    Intake/Output Summary (Last 24 hours) at 02/07/13 1110 Last data filed at 02/07/13 1000  Gross per 24 hour  Intake   3505 ml  Output      0 ml  Net   3505 ml   Weight change: -2.203 kg (-4 lb 13.7 oz) Exam:   General:  Pt is alert, follows commands appropriately, not in acute distress  HEENT: No icterus, No thrush, [place]/AT  Cardiovascular: RRR, S1/S2, no rubs, no gallops  Respiratory: CTA bilaterally, no wheezing, no crackles, no rhonchi  Abdomen: Soft/+BS, non tender, non distended, no guarding  Extremities: 1+ edema, No lymphangitis, No petechiae, No rashes, no synovitis  Data Reviewed: Basic Metabolic Panel:  Recent Labs Lab 02/05/13 1546 02/07/13 0950  NA 136 138  K 4.9 4.1  CL 105 108  CO2 18* 20  GLUCOSE 120* 105*  BUN 49* 36*  CREATININE 1.54* 1.11  CALCIUM 7.3* 7.2*   Liver Function  Tests:  Recent Labs Lab 02/05/13 1546 02/07/13 0950  AST 15 19  ALT 17 16  ALKPHOS 50 43  BILITOT 0.2* 0.5  PROT 4.4* 3.8*  ALBUMIN 2.3* 2.2*   No results found for this basename: LIPASE, AMYLASE,  in the last 168 hours No results found for this basename: AMMONIA,  in the last 168 hours CBC:  Recent Labs Lab 02/05/13 1546  02/06/13 0450 02/06/13 1050 02/06/13 1614 02/07/13 0340 02/07/13 0950  WBC 13.3*  < > 13.6* 11.2* 10.8* 16.5* 13.3*   NEUTROABS 11.6*  --   --   --   --   --   --   HGB 6.0*  < > 8.4* 7.3* 6.1* 8.0* 7.0*  HCT 18.4*  < > 25.0* 21.1* 17.5* 23.0* 20.0*  MCV 92.9  < > 89.0 87.9 87.1 86.5 85.8  PLT 142*  < > 156 129* 52* 149* 127*  < > = values in this interval not displayed. Cardiac Enzymes: No results found for this basename: CKTOTAL, CKMB, CKMBINDEX, TROPONINI,  in the last 168 hours BNP: No components found with this basename: POCBNP,  CBG: No results found for this basename: GLUCAP,  in the last 168 hours  Recent Results (from the past 240 hour(s))  MRSA PCR SCREENING     Status: None   Collection Time    02/05/13  9:53 PM      Result Value Range Status   MRSA by PCR NEGATIVE  NEGATIVE Final   Comment:            The GeneXpert MRSA Assay (FDA     approved for NASAL specimens     only), is one component of a     comprehensive MRSA colonization     surveillance program. It is not     intended to diagnose MRSA     infection nor to guide or     monitor treatment for     MRSA infections.     Scheduled Meds: . atorvastatin  40 mg Oral Daily  . cholecalciferol  1,000 Units Oral Daily  . gabapentin  200 mg Oral QHS  . sodium chloride  3 mL Intravenous Q12H   Continuous Infusions: . sodium chloride 125 mL/hr at 02/07/13 0047  . sodium chloride 20 mL/hr at 02/06/13 2024     Lewanda Perea, DO  [facility] Pager [phone]  If 7PM-7AM, please contact night-coverage [email or web address] Password [email or web address] 02/07/2013, 11:10 AM   LOS: 2 days

## 2013-02-07 NOTE — Progress Notes (Signed)
Patient went to endoscopy with PRBC's infusing into PIV. Pt escorted to endo by RN. Report given to endoscopy nurse.

## 2013-02-07 NOTE — Progress Notes (Signed)
Patient ID: [name], male   DOB: 1940-11-30, 72 y.o.   MRN: [id number]    Subjective: Pt has had a rough night with c-scope prep.  Had 3 more units of pRBCs overnight.  Still with hematochezia   Objective: Vital signs in last 24 hours: Temp:  [97.4 F (36.3 C)-98.3 F (36.8 C)] 98.1 F (36.7 C) (05/21 0330) Pulse Rate:  [66-107] 79 (05/21 0700) Resp:  [5-24] 20 (05/21 0700) BP: (74-144)/(34-117) 103/56 mmHg (05/21 0700) SpO2:  [92 %-100 %] 97 % (05/21 0700) Weight:  [385 lb 2.3 oz (174.7 kg)] 385 lb 2.3 oz (174.7 kg) (05/21 0330) Last BM Date: 02/05/13  Intake/Output from previous day: 05/20 0701 - 05/21 0700 In: 3630 [I.V.:2455; Blood:1175] Out: 600 [Urine:200; Stool:400] Intake/Output this shift:    PE: Abd: soft, NT, ND, +BS Heart: regular, + murmur Lungs: CTAB  Lab Results:   Recent Labs  02/06/13 1614 02/07/13 0340  WBC 10.8* 16.5*  HGB 6.1* 8.0*  HCT 17.5* 23.0*  PLT 52* 149*   BMET  Recent Labs  02/05/13 1546  NA 136  K 4.9  CL 105  CO2 18*  GLUCOSE 120*  BUN 49*  CREATININE 1.54*  CALCIUM 7.3*   PT/INR No results found for this basename: LABPROT, INR,  in the last 72 hours CMP     Component Value Date/Time   NA 136 02/05/2013 1546   K 4.9 02/05/2013 1546   CL 105 02/05/2013 1546   CO2 18* 02/05/2013 1546   GLUCOSE 120* 02/05/2013 1546   BUN 49* 02/05/2013 1546   CREATININE 1.54* 02/05/2013 1546   CALCIUM 7.3* 02/05/2013 1546   PROT 4.4* 02/05/2013 1546   ALBUMIN 2.3* 02/05/2013 1546   AST 15 02/05/2013 1546   ALT 17 02/05/2013 1546   ALKPHOS 50 02/05/2013 1546   BILITOT 0.2* 02/05/2013 1546   GFRNONAA 44* 02/05/2013 1546   GFRAA 51* 02/05/2013 1546   Lipase  No results found for this basename: lipase       Studies/Results: No results found.  Anti-infectives: Anti-infectives   None       Assessment/Plan  1. Hematochezia Patient Active Problem List   Diagnosis Date Noted  . Acute lower gastrointestinal bleeding 02/05/2013    Class: Acute  . Acute blood loss anemia 02/05/2013    Class: Acute  . EDEMA 04/15/2010  . HYPERCHOLESTEROLEMIA 04/14/2010  . GOUT 04/14/2010  . ANEMIA 04/14/2010  . AORTIC STENOSIS 04/14/2010  . VALVULAR HEART DISEASE 04/14/2010  . OSTEOARTHRITIS 04/14/2010  . ARTHRITIS 04/14/2010  . DEGENERATIVE DISC DISEASE 04/14/2010  . BURSITIS 04/14/2010  . OBESITY, MORBID 03/21/2010  . OBSTRUCTIVE SLEEP APNEA 02/19/2008   Plan: 1. For colonoscopy today.  I think if this doesn't show anything an endoscopy is warranted given his chronic use of diclofenac. 2. No surgical intervention planned for now.  Cont to transfuse as needed.  Will follow.   LOS: 2 days    Kendrew Paci E 02/07/2013, 7:36 AM Pager: [phone]

## 2013-02-07 NOTE — Op Note (Signed)
[facility] [place], [id number]   OPERATIVE PROCEDURE REPORT  PATIENT :[name], [name]  MR#: [id number] BIRTHDATE :1940-11-24 GENDER: Male ENDOSCOPIST: Dr.  Edmonia James, MD ASSISTANT:   William Dalton, technician Hilma Favors, RN PROCEDURE DATE: 23-Feb-2013 PRE-PROCEDURE PREPERATION: Patient fasted for 4 hours prior to procedure. PRE-PROCEDURE PHYSICAL: Patient has stable vital signs [after receiving a fluid bolus and another unit of PRBC's.  Neck is supple.  There is no JVD, thyromegaly or LAD.  Chest clear to auscultation.  S1 and S2 regular.  Abdomen soft, non-distended, non-tender with NABS. PROCEDURE:     EGD, diagnostic ASA CLASS:     Class IV INDICATIONS:     Acute post hemorrhagic anemia and hematochezia.  MEDICATIONS:     Fentanyl and Versed given for the colonoscopy-none for the EGD. TOPICAL ANESTHETIC:   none given.  DESCRIPTION OF PROCEDURE:   After the risks benefits and alternatives of the procedure were thoroughly explained, informed consent was obtained.  The Pentax Gastroscope [id number]  was introduced through the mouth and advanced to the second portion of the duodenum , without limitations. The instrument was slowly withdrawn as the mucosa was fully examined.   The esophagus, GEJ and the proximal small bowel appeared normal. There were no masses or polyps noted. Multiple superficial ulcers noted in the proximal duodenum. There was no fresh or old heme in the upper GI tract. Retroflexed views revealed no abnormalities. The scope was then withdrawn from the patient and the procedure terminated. The patient tolerated the procedure without immediate complications.  IMPRESSION:  Multiple superficial ulcers in the proximal duodenum; otherwise, normal esophagogastroduodenoscopy; no fresh or old heme noted on the EGD.  RECOMMENDATIONS:     1.  Avoid ALL NSAIDS for now. 2.  Anti-reflux regimen to be followed. 3.  Serial CBC's. 4. Bleeding scan if patient continues to bleed.   REPEAT EXAM:  No recall planned for now.  DISCHARGE INSTRUCTIONS: Standard discharge instructions given _______________________________ eSigned:  Dr. Edmonia James, MD 02-23-2013 6:25 PM   CPT CODES:     [phone], EGD  DIAGNOSIS CODES:     569.3 Rectal Bleeding, 280.9   CC: Kathryne Eriksson, MD  PATIENT NAME:  [name] MR#: [id number]

## 2013-02-07 NOTE — Progress Notes (Signed)
Notified by nurse that patient's most recent Hgb @0950  was 7.0.  Pt is asymptomatic and hemodynamically stable.  Will order another 3units PRBC. This will a total of 10units PRBC for the admission. Colonoscopy is planned at [facility] today (02/07/13).  Pt is resting comfortably, denies cp, sob, abdominal pain HR92, 122/45, 98%RA  DTat

## 2013-02-07 NOTE — Progress Notes (Signed)
Agree with above, may need bleeding scan also, he continues to bleed with no real source found yet

## 2013-02-07 NOTE — Op Note (Addendum)
[facility] [place], [address]   COLONOSCOPY PROCEDURE REPORT  PATIENT: [name], [name]  MR#: [id number] BIRTHDATE: [birth year] , 71  yrs. old GENDER: Male ENDOSCOPIST: Dr.  Edmonia James, MD REFERRED [id number] Redmond Pulling, MD PROCEDURE DATE:  02/07/2013 PROCEDURE:   Colonoscopy, diagnostic ASA CLASS:   Class IV INDICATIONS:Rectal Bleeding and  severe anemia secondary to GI blood loss [patient has been on Diclofenac for several years].. MEDICATIONS: Fentanyl 25 mcg and Versed 2 mg IV  DESCRIPTION OF PROCEDURE: After the risks benefits and alternatives of the procedure were thoroughly explained, informed consent was obtained.  A digital rectal exam revealed no rectal mass.   The [id number]  endoscope was introduced through the anus and advanced to the cecum, which was identified by both the appendix and ileocecal valve. No adverse events experienced.   The quality of the prep was fair, using [place]  The instrument was then slowly withdrawn as the colon was fully examined.    COLON FINDINGS: There was a large amount of blood and clots in the colon but on washing off the blood no abnormalities were noted. The cecal base had some debris and was therefore not completely visualized. No diverticula, masses or polyps were noted. The areas of the colon that were washed and visualized appeared normal. Fresh heme noted in the terminal ileum. Retroflexion was attempted but only debris was noted. The time to cecum=22 minutes 0 seconds. Withdrawal time=20 minutes 0 seconds.  The scope was withdrawn and the procedure completed. COMPLICATIONS: There were no complications.  ENDOSCOPIC IMPRESSION: Large amount of blood and clots in the colon-no diverticula, masses or polyps noted. Fresh blood noted in the T.I but it is hard to tell if this is from the colon or from the small bowel. There was a lot of debris in the colon-small lesions could be  missed.   RECOMMENDATIONS: Proceed with an EGD. Hold all NSAIDS for now.   eSigned:  Dr. Edmonia James, MD 02/07/2013 6:17 PM Revised: 02/07/2013 6:17 PM cc: Kathryne Eriksson, MD   PATIENT NAME:  [name], [name] MR#: [id number]

## 2013-02-08 ENCOUNTER — Inpatient Hospital Stay (HOSPITAL_COMMUNITY): Payer: Medicare Other

## 2013-02-08 ENCOUNTER — Other Ambulatory Visit (HOSPITAL_COMMUNITY): Payer: Medicare Other

## 2013-02-08 LAB — BASIC METABOLIC PANEL
BUN: 28 mg/dL — ABNORMAL HIGH (ref 6–23)
CO2: 18 mEq/L — ABNORMAL LOW (ref 19–32)
Calcium: 7 mg/dL — ABNORMAL LOW (ref 8.4–10.5)
Chloride: 107 mEq/L (ref 96–112)
Creatinine, Ser: 1.09 mg/dL (ref 0.50–1.35)
GFR calc Af Amer: 77 mL/min — ABNORMAL LOW (ref >90)
GFR calc non Af Amer: 67 mL/min — ABNORMAL LOW (ref >90)
Glucose, Bld: 113 mg/dL — ABNORMAL HIGH (ref 70–99)
Potassium: 3.9 mEq/L (ref 3.5–5.1)
Sodium: 136 mEq/L (ref 135–145)

## 2013-02-08 LAB — CBC
HCT: 20.5 % — ABNORMAL LOW (ref 39.0–52.0)
HCT: 19.5 % — ABNORMAL LOW (ref 39.0–52.0)
HCT: 21.6 % — ABNORMAL LOW (ref 39.0–52.0)
Hemoglobin: 7.2 g/dL — ABNORMAL LOW (ref 13.0–17.0)
Hemoglobin: 6.9 g/dL — CL (ref 13.0–17.0)
Hemoglobin: 7.6 g/dL — ABNORMAL LOW (ref 13.0–17.0)
MCH: 30.1 pg (ref 26.0–34.0)
MCH: 30.7 pg (ref 26.0–34.0)
MCH: 30 pg (ref 26.0–34.0)
MCHC: 35.1 g/dL (ref 30.0–36.0)
MCHC: 35.4 g/dL (ref 30.0–36.0)
MCHC: 35.2 g/dL (ref 30.0–36.0)
MCV: 85.8 fL (ref 78.0–100.0)
MCV: 86.7 fL (ref 78.0–100.0)
MCV: 85.4 fL (ref 78.0–100.0)
Platelets: 111 10*3/uL — ABNORMAL LOW (ref 150–400)
Platelets: 110 10*3/uL — ABNORMAL LOW (ref 150–400)
Platelets: 95 10*3/uL — ABNORMAL LOW (ref 150–400)
RBC: 2.39 MIL/uL — ABNORMAL LOW (ref 4.22–5.81)
RBC: 2.25 MIL/uL — ABNORMAL LOW (ref 4.22–5.81)
RBC: 2.53 MIL/uL — ABNORMAL LOW (ref 4.22–5.81)
RDW: 16.2 % — ABNORMAL HIGH (ref 11.5–15.5)
RDW: 16.4 % — ABNORMAL HIGH (ref 11.5–15.5)
RDW: 17.4 % — ABNORMAL HIGH (ref 11.5–15.5)
WBC: 11.6 10*3/uL — ABNORMAL HIGH (ref 4.0–10.5)
WBC: 10.5 10*3/uL (ref 4.0–10.5)
WBC: 8.9 10*3/uL (ref 4.0–10.5)

## 2013-02-08 LAB — PREPARE RBC (CROSSMATCH)

## 2013-02-08 MED ORDER — MIDAZOLAM HCL 2 MG/2ML IJ SOLN
INTRAMUSCULAR | Status: AC | PRN
  Administered 2013-02-08: 0.5 mg via INTRAVENOUS
  Administered 2013-02-08: 2 mg via INTRAVENOUS

## 2013-02-08 MED ORDER — FENTANYL CITRATE 0.05 MG/ML IJ SOLN
INTRAMUSCULAR | Status: AC | PRN
  Administered 2013-02-08: 25 ug via INTRAVENOUS
  Administered 2013-02-08: 50 ug via INTRAVENOUS

## 2013-02-08 MED ORDER — IOHEXOL 300 MG/ML  SOLN
150.0000 mL | Freq: Once | INTRAMUSCULAR | Status: AC | PRN
  Administered 2013-02-08: 240 mL via INTRA_ARTERIAL

## 2013-02-08 NOTE — Progress Notes (Signed)
Subjective: No acute events.  Hungry.  Objective: Vital signs in last 24 hours: Temp:  [97.5 F (36.4 C)-99.1 F (37.3 C)] 99.1 F (37.3 C) (05/22 0726) Pulse Rate:  [81-130] 93 (05/22 0726) Resp:  [9-40] 23 (05/22 0726) BP: (78-191)/(25-170) 102/49 mmHg (05/22 0726) SpO2:  [84 %-100 %] 99 % (05/22 0726) Last BM Date: 02/07/13  Intake/Output from previous day: 05/21 0701 - 05/22 0700 In: 1950 [I.V.:1500; Blood:450] Out: 550 [Urine:550] Intake/Output this shift:    General appearance: alert and no distress GI: soft, non-tender; bowel sounds normal; no masses,  no organomegaly  Lab Results:  Recent Labs  02/07/13 0340 02/07/13 0950 02/08/13 0355  WBC 16.5* 13.3* 11.6*  HGB 8.0* 7.0* 7.2*  HCT 23.0* 20.0* 20.5*  PLT 149* 127* 111*   BMET  Recent Labs  02/05/13 1546 02/07/13 0950 02/08/13 0355  NA 136 138 136  K 4.9 4.1 3.9  CL 105 108 107  CO2 18* 20 18*  GLUCOSE 120* 105* 113*  BUN 49* 36* 28*  CREATININE 1.54* 1.11 1.09  CALCIUM 7.3* 7.2* 7.0*   LFT  Recent Labs  02/07/13 0950  PROT 3.8*  ALBUMIN 2.2*  AST 19  ALT 16  ALKPHOS 43  BILITOT 0.5   PT/INR No results found for this basename: LABPROT, INR,  in the last 72 hours Hepatitis Panel No results found for this basename: HEPBSAG, HCVAB, HEPAIGM, HEPBIGM,  in the last 72 hours C-Diff No results found for this basename: CDIFFTOX,  in the last 72 hours Fecal Lactopherrin No results found for this basename: FECLLACTOFRN,  in the last 72 hours  Studies/Results: No results found.  Medications:  Scheduled: . atorvastatin  40 mg Oral Daily  . cholecalciferol  1,000 Units Oral Daily  . gabapentin  200 mg Oral QHS  . sodium chloride  3 mL Intravenous Q12H   Continuous: . sodium chloride 125 mL/hr at 02/07/13 0047  . sodium chloride 20 mL/hr at 02/06/13 2024    Assessment/Plan: 1) GI bleed.   The source of the bleeding was not identified.  There was blood in the colon, but no blood in  the upper GI tract.  His HGB appears to be stable, but I am suspecting he will start to rebleed again.  Plan: 1) Follow HGB. 2) Transfuse as necessary. 3) If bleeding recurs a bleeding scan will be ordered.   LOS: 3 days   Rodrigo Mcgranahan D 02/08/2013, 8:07 AM

## 2013-02-08 NOTE — Progress Notes (Signed)
[facility] PROGRESS NOTE  [name] [id number] DOB: 03-01-1941 DOA: 02/05/2013 PCP: Woody Seller, MD  Brief narrative: 72 year old morbidly obese male with hx of valvular heart ds, AS, OSA and anemia admitted with BRBPR and anemia. Received 10 units PRBC since admission. Colonoscopy done on 5/21 showed large amount of blood in colon but no source of bleeding. EGD showed multiple superficial ulcers in proximal duodenum but again no source of bleeding. Patient continues to have BRBPR .   Assessment/Plan:  Acute lower GI bleed  no clear source of bleed. S/p 10 u PRBC transfusion, hb still low at 7.2. Continues to have BRBPR. Ordered for 2 more units today ( 12 so far) Hemodynamically  stable.  Ordered for bleeding scan but given weight limit ( 330lbs) cannot get the scan in the  hospital . ( pts weight is 385 lbs) Will d/w Dr Benson Norway if capsule endoscopy is the next option.  Continue monitor in stepdown Hold ASA and strictly avoid NSAIDs  continue PPI Clear liquids.  appreciate GI and surgery consult  Hypotension  BP low normal . Holding BP meds  Aortic stenosis  TEE from 2011 cannot r/o  AS.  normal EF. No signs of CHF   Code Status: full code Family Communication: none at bedside  Disposition Plan:   Consultants:  Adriana Mccallum  CCS  Procedures:  EGD/ Colonoscopy on 5/21  Antibiotics:  none  HPI/Subjective: Patient had an episode of BRBPR few minutes before i walked into his room. Denies hematemesis or abdominal  pain. Hb still low.   Objective: Filed Vitals:   02/07/13 2345 02/08/13 0000 02/08/13 0404 02/08/13 0726  BP: 114/47 110/56 105/56 102/49  Pulse: 81 85 90 93  Temp:  98.7 F (37.1 C) 98.2 F (36.8 C) 99.1 F (37.3 C)  TempSrc:  Axillary Oral Oral  Resp: 20 20 15 23   Height:      Weight:      SpO2:  97% 98% 99%    Intake/Output Summary (Last 24 hours) at 02/08/13 0849 Last data filed at 02/08/13 0600  Gross per 24 hour   Intake   1825 ml  Output    550 ml  Net   1275 ml   Filed Weights   02/05/13 2142 02/06/13 0358 02/07/13 0330  Weight: 170.4 kg (375 lb 10.6 oz) 170.4 kg (375 lb 10.6 oz) 174.7 kg (385 lb 2.3 oz)    Exam:   General:  Elderly obese male in NAD  HEENT: pallor+, Moist mucosa  Cardiovascular: Q000111Q, 3/6 systolic murmurs, rubs or gallop  Respiratory: clear b/l, no added sounds  Abdomen: soft, obese, NT, ND, BS+. Bright red bleeding per rectum on bedsheet  Musculoskeletal: warm, chronic venous stasis over b/l leg  CNS: AAOX3  Data Reviewed: Basic Metabolic Panel:  Recent Labs Lab 02/05/13 1546 02/07/13 0950 02/08/13 0355  NA 136 138 136  K 4.9 4.1 3.9  CL 105 108 107  CO2 18* 20 18*  GLUCOSE 120* 105* 113*  BUN 49* 36* 28*  CREATININE 1.54* 1.11 1.09  CALCIUM 7.3* 7.2* 7.0*   Liver Function Tests:  Recent Labs Lab 02/05/13 1546 02/07/13 0950  AST 15 19  ALT 17 16  ALKPHOS 50 43  BILITOT 0.2* 0.5  PROT 4.4* 3.8*  ALBUMIN 2.3* 2.2*   No results found for this basename: LIPASE, AMYLASE,  in the last 168 hours No results found for this basename: AMMONIA,  in the last 168 hours CBC:  Recent Labs  Lab 02/05/13 1546  02/06/13 1050 02/06/13 1614 02/07/13 0340 02/07/13 0950 02/08/13 0355  WBC 13.3*  < > 11.2* 10.8* 16.5* 13.3* 11.6*  NEUTROABS 11.6*  --   --   --   --   --   --   HGB 6.0*  < > 7.3* 6.1* 8.0* 7.0* 7.2*  HCT 18.4*  < > 21.1* 17.5* 23.0* 20.0* 20.5*  MCV 92.9  < > 87.9 87.1 86.5 85.8 85.8  PLT 142*  < > 129* 52* 149* 127* 111*  < > = values in this interval not displayed. Cardiac Enzymes: No results found for this basename: CKTOTAL, CKMB, CKMBINDEX, TROPONINI,  in the last 168 hours BNP (last 3 results) No results found for this basename: PROBNP,  in the last 8760 hours CBG: No results found for this basename: GLUCAP,  in the last 168 hours  Recent Results (from the past 240 hour(s))  MRSA PCR SCREENING     Status: None   Collection  Time    02/05/13  9:53 PM      Result Value Range Status   MRSA by PCR NEGATIVE  NEGATIVE Final   Comment:            The GeneXpert MRSA Assay (FDA     approved for NASAL specimens     only), is one component of a     comprehensive MRSA colonization     surveillance program. It is not     intended to diagnose MRSA     infection nor to guide or     monitor treatment for     MRSA infections.     Studies: No results found.  Scheduled Meds: . atorvastatin  40 mg Oral Daily  . cholecalciferol  1,000 Units Oral Daily  . gabapentin  200 mg Oral QHS  . sodium chloride  3 mL Intravenous Q12H   Continuous Infusions: . sodium chloride 125 mL/hr at 02/07/13 0047  . sodium chloride 20 mL/hr at 02/06/13 2024      Time spent: 35 minutes    [name]  [facility] Pager [phone]. If 7PM-7AM, please contact night-coverage at [email or web address], password [facility] 02/08/2013, 8:49 AM  LOS: 3 days

## 2013-02-08 NOTE — Progress Notes (Signed)
Discussed with surgery Dr Donne Hazel regarding patient 's ongoing rectal bleed. Since he cannot get bleeding scan due to his weight i spoke with IR Dr Vernard Gambles for a CT angiogram and he agrees for it. Consult placed.

## 2013-02-08 NOTE — Progress Notes (Signed)
He continues to bleed, he has colon full of blood as well as in terminal ileum. Does not appear to be from upper source from egd.  He cannot apparently get tagged scan due to weight.  Would consider going to icu.  I think angiogram is the next step.  He had received ten units of blood prior to any localization and is still bleeding.  nm scan is usually first step to confirm bleeding but I think because he cannot get it pursuing angiogram is the best option before proceeding to or for evaluation which I think is most morbid. Discussed with medical team

## 2013-02-08 NOTE — Progress Notes (Signed)
Patient ID: [name], male   DOB: 1941/04/15, 72 y.o.   MRN: [id number] Request received for mesenteric arteriogram/possible embolization in pt with history of persistent GI bleeding since 02/02/2013 with no obvious source by endoscopy or colonoscopy.Pt could not undergo nuc med bleeding scan due to weight. Additional PMH as below. Exam: pt awake/alert; chest- CTA bilat ant., heart- RRR with murmur; abd- soft, morbidly obese, NT,+BS; ext- FROM, LE edema noted, changes of chronic venous stasis.   Filed Vitals:   02/08/13 0000 02/08/13 0404 02/08/13 0726 02/08/13 1103  BP: 110/56 105/56 102/49 119/51  Pulse: 85 90 93 92  Temp: 98.7 F (37.1 C) 98.2 F (36.8 C) 99.1 F (37.3 C) 97.9 F (36.6 C)  TempSrc: Axillary Oral Oral Oral  Resp: 20 15 23 15   Height:      Weight:      SpO2: 97% 98% 99%    Past Medical History  Diagnosis Date  . Valvular heart disease   . Aortic stenosis   . Osteoarthritis   . Anemia   . OSA (obstructive sleep apnea)   . Bursitis   . Gout   . Arthritis   . Hyperlipidemia   . Morbid obesity   . DDD (degenerative disc disease)   . Mixed sensory-motor polyneuropathy   . Peroneal palsy     significant right foot drop  . Hemorrhoids     hx of bleeding  . Hypertension    Past Surgical History  Procedure Laterality Date  . Total knee arthroplasty      right  . Spine surgery    . Laminotomy  1193    c6-t2  . Hernia repair      umbilical hernia  . Knee surgery Right     multiple knee surgeries due to complication of R TKA   A/P: Pt with persistent GI bleed and no obvious source. Plan is for mesenteric arteriogram with possible embolization/endovascular intervention today. Details/risks of procedure d/w pt/wife with their understanding and consent.

## 2013-02-08 NOTE — Progress Notes (Signed)
Patient ID: [name], male   DOB: [birth year], 72 y.o.   MRN: [id number] 1 Day Post-Op  Subjective: Pt denies abd pain.  Still with significant hematochezia   Objective: Vital signs in last 24 hours: Temp:  [97.5 F (36.4 C)-99.1 F (37.3 C)] 99.1 F (37.3 C) (05/22 0726) Pulse Rate:  [81-130] 93 (05/22 0726) Resp:  [9-40] 23 (05/22 0726) BP: (78-191)/(25-170) 102/49 mmHg (05/22 0726) SpO2:  [84 %-100 %] 99 % (05/22 0726) Last BM Date: 02/07/13  Intake/Output from previous day: 05/21 0701 - 05/22 0700 In: 1950 [I.V.:1500; Blood:450] Out: 550 [Urine:550] Intake/Output this shift:    PE: Abd: soft, NT, ND, +BS Heart: regular, + murmur Lungs: CTAB  Lab Results:   Recent Labs  02/07/13 0950 02/08/13 0355  WBC 13.3* 11.6*  HGB 7.0* 7.2*  HCT 20.0* 20.5*  PLT 127* 111*   BMET  Recent Labs  02/07/13 0950 02/08/13 0355  NA 138 136  K 4.1 3.9  CL 108 107  CO2 20 18*  GLUCOSE 105* 113*  BUN 36* 28*  CREATININE 1.11 1.09  CALCIUM 7.2* 7.0*   PT/INR No results found for this basename: LABPROT, INR,  in the last 72 hours CMP     Component Value Date/Time   NA 136 02/08/2013 0355   K 3.9 02/08/2013 0355   CL 107 02/08/2013 0355   CO2 18* 02/08/2013 0355   GLUCOSE 113* 02/08/2013 0355   BUN 28* 02/08/2013 0355   CREATININE 1.09 02/08/2013 0355   CALCIUM 7.0* 02/08/2013 0355   PROT 3.8* 02/07/2013 0950   ALBUMIN 2.2* 02/07/2013 0950   AST 19 02/07/2013 0950   ALT 16 02/07/2013 0950   ALKPHOS 43 02/07/2013 0950   BILITOT 0.5 02/07/2013 0950   GFRNONAA 67* 02/08/2013 0355   GFRAA 77* 02/08/2013 0355   Lipase  No results found for this basename: lipase       Studies/Results: No results found.  Anti-infectives: Anti-infectives   None       Assessment/Plan  1. Hematochezia Patient Active Problem List   Diagnosis Date Noted  . Acute lower gastrointestinal bleeding 02/05/2013    Class: Acute  . Acute blood loss anemia 02/05/2013    Class: Acute  .  EDEMA 04/15/2010  . HYPERCHOLESTEROLEMIA 04/14/2010  . GOUT 04/14/2010  . ANEMIA 04/14/2010  . AORTIC STENOSIS 04/14/2010  . VALVULAR HEART DISEASE 04/14/2010  . OSTEOARTHRITIS 04/14/2010  . ARTHRITIS 04/14/2010  . DEGENERATIVE DISC DISEASE 04/14/2010  . BURSITIS 04/14/2010  . OBESITY, MORBID 03/21/2010  . OBSTRUCTIVE SLEEP APNEA 02/19/2008   Plan: 1. Acute GI Bleed: colonoscopy was unable to identify a source of the bleed, patient to large for bleeding scan, Dr. Donne [name] recommends CT angio by IR and Dr. Laverle [name] has spoken to IR and has order this.  We will follow for now but no surgical intervention unless a definitive source can be identified.     LOS: 3 days    [name] 02/08/2013, 10:07 AM

## 2013-02-09 ENCOUNTER — Encounter (HOSPITAL_COMMUNITY): Payer: Self-pay | Admitting: Gastroenterology

## 2013-02-09 ENCOUNTER — Encounter (HOSPITAL_COMMUNITY)
Admission: EM | Disposition: A | Discharge status: Skilled Nursing Facility | Payer: Self-pay | Source: Home / Self Care | Attending: Internal Medicine

## 2013-02-09 DIAGNOSIS — D696 Thrombocytopenia, unspecified: Secondary | ICD-10-CM

## 2013-02-09 HISTORY — PX: COLONOSCOPY: SHX5424

## 2013-02-09 HISTORY — PX: GIVENS CAPSULE STUDY: SHX5432

## 2013-02-09 LAB — CBC
HCT: 18.8 % — ABNORMAL LOW (ref 39.0–52.0)
HCT: 19.2 % — ABNORMAL LOW (ref 39.0–52.0)
HCT: 20.5 % — ABNORMAL LOW (ref 39.0–52.0)
Hemoglobin: 6.6 g/dL — CL (ref 13.0–17.0)
Hemoglobin: 6.7 g/dL — CL (ref 13.0–17.0)
Hemoglobin: 7.3 g/dL — ABNORMAL LOW (ref 13.0–17.0)
MCH: 30.1 pg (ref 26.0–34.0)
MCH: 30 pg (ref 26.0–34.0)
MCH: 30.9 pg (ref 26.0–34.0)
MCHC: 35.1 g/dL (ref 30.0–36.0)
MCHC: 34.9 g/dL (ref 30.0–36.0)
MCHC: 35.6 g/dL (ref 30.0–36.0)
MCV: 85.8 fL (ref 78.0–100.0)
MCV: 86.1 fL (ref 78.0–100.0)
MCV: 86.9 fL (ref 78.0–100.0)
Platelets: 96 10*3/uL — ABNORMAL LOW (ref 150–400)
Platelets: 110 10*3/uL — ABNORMAL LOW (ref 150–400)
Platelets: 92 10*3/uL — ABNORMAL LOW (ref 150–400)
RBC: 2.19 MIL/uL — ABNORMAL LOW (ref 4.22–5.81)
RBC: 2.23 MIL/uL — ABNORMAL LOW (ref 4.22–5.81)
RBC: 2.36 MIL/uL — ABNORMAL LOW (ref 4.22–5.81)
RDW: 17.8 % — ABNORMAL HIGH (ref 11.5–15.5)
RDW: 18.2 % — ABNORMAL HIGH (ref 11.5–15.5)
RDW: 17.4 % — ABNORMAL HIGH (ref 11.5–15.5)
WBC: 7.9 10*3/uL (ref 4.0–10.5)
WBC: 8.7 10*3/uL (ref 4.0–10.5)
WBC: 7.6 10*3/uL (ref 4.0–10.5)

## 2013-02-09 LAB — TYPE AND SCREEN
ABO/RH(D): O POS
Antibody Screen: NEGATIVE
Unit division: 0
Unit division: 0
Unit division: 0
Unit division: 0
Unit division: 0
Unit division: 0
Unit division: 0
Unit division: 0
Unit division: 0
Unit division: 0
Unit division: 0
Unit division: 0
Unit division: 0
Unit division: 0

## 2013-02-09 LAB — PREPARE RBC (CROSSMATCH)

## 2013-02-09 SURGERY — IMAGING PROCEDURE, GI TRACT, INTRALUMINAL, VIA CAPSULE
Anesthesia: LOCAL

## 2013-02-09 SURGERY — COLONOSCOPY
Anesthesia: Moderate Sedation

## 2013-02-09 MED ORDER — FENTANYL CITRATE 0.05 MG/ML IJ SOLN
INTRAMUSCULAR | Status: AC
  Filled 2013-02-09: qty 4

## 2013-02-09 MED ORDER — EPINEPHRINE HCL 0.1 MG/ML IJ SOSY
PREFILLED_SYRINGE | INTRAMUSCULAR | Status: AC
  Filled 2013-02-09: qty 10

## 2013-02-09 MED ORDER — FUROSEMIDE 10 MG/ML IJ SOLN
40.0000 mg | Freq: Once | INTRAMUSCULAR | Status: AC
  Administered 2013-02-09: 40 mg via INTRAVENOUS
  Filled 2013-02-09: qty 4

## 2013-02-09 MED ORDER — DIPHENHYDRAMINE HCL 50 MG/ML IJ SOLN
INTRAMUSCULAR | Status: AC
  Filled 2013-02-09: qty 1

## 2013-02-09 MED ORDER — SODIUM CHLORIDE 0.9 % IV SOLN: INTRAVENOUS | Status: DC

## 2013-02-09 MED ORDER — SODIUM CHLORIDE 0.9 % IJ SOLN
INTRAMUSCULAR | Status: DC | PRN
  Administered 2013-02-09: 18:00:00

## 2013-02-09 MED ORDER — MIDAZOLAM HCL 5 MG/ML IJ SOLN
INTRAMUSCULAR | Status: AC
  Filled 2013-02-09: qty 4

## 2013-02-09 NOTE — Progress Notes (Signed)
Patient ID: [name], male   DOB: July 14, 1941, 72 y.o.   MRN: [id number] 2 Days Post-Op  Subjective: Pt denies abd pain.  Still with significant hematochezia   Objective: Vital signs in last 24 hours: Temp:  [97.9 F (36.6 C)-100.1 F (37.8 C)] 98.1 F (36.7 C) (05/23 0414) Pulse Rate:  [70-95] 70 (05/23 0414) Resp:  [13-25] 20 (05/23 0414) BP: (97-141)/(43-71) 110/49 mmHg (05/23 0414) SpO2:  [90 %-100 %] 96 % (05/23 0414) Weight:  [402 lb 9 oz (182.6 kg)] 402 lb 9 oz (182.6 kg) (05/23 0600) Last BM Date: 02/08/13  Intake/Output from previous day: 05/22 0701 - 05/23 0700 In: 3660 [P.O.:960; I.V.:2000; Blood:700] Out: 501 [Urine:501] Intake/Output this shift:    PE: Abd: soft, NT, ND, +BS Heart: regular, + murmur Lungs: CTAB  Lab Results:   Recent Labs  02/08/13 1959 02/09/13 0335  WBC 8.9 7.9  HGB 7.6* 6.6*  HCT 21.6* 18.8*  PLT 95* 96*   BMET  Recent Labs  02/07/13 0950 02/08/13 0355  NA 138 136  K 4.1 3.9  CL 108 107  CO2 20 18*  GLUCOSE 105* 113*  BUN 36* 28*  CREATININE 1.11 1.09  CALCIUM 7.2* 7.0*   PT/INR No results found for this basename: LABPROT, INR,  in the last 72 hours CMP     Component Value Date/Time   NA 136 02/08/2013 0355   K 3.9 02/08/2013 0355   CL 107 02/08/2013 0355   CO2 18* 02/08/2013 0355   GLUCOSE 113* 02/08/2013 0355   BUN 28* 02/08/2013 0355   CREATININE 1.09 02/08/2013 0355   CALCIUM 7.0* 02/08/2013 0355   PROT 3.8* 02/07/2013 0950   ALBUMIN 2.2* 02/07/2013 0950   AST 19 02/07/2013 0950   ALT 16 02/07/2013 0950   ALKPHOS 43 02/07/2013 0950   BILITOT 0.5 02/07/2013 0950   GFRNONAA 67* 02/08/2013 0355   GFRAA 77* 02/08/2013 0355   Lipase  No results found for this basename: lipase       Studies/Results: No results found.  Anti-infectives: Anti-infectives   None       Assessment/Plan  1. Hematochezia Patient Active Problem List   Diagnosis Date Noted  . Acute lower gastrointestinal bleeding 02/05/2013   Class: Acute  . Acute blood loss anemia 02/05/2013    Class: Acute  . EDEMA 04/15/2010  . HYPERCHOLESTEROLEMIA 04/14/2010  . GOUT 04/14/2010  . ANEMIA 04/14/2010  . AORTIC STENOSIS 04/14/2010  . VALVULAR HEART DISEASE 04/14/2010  . OSTEOARTHRITIS 04/14/2010  . ARTHRITIS 04/14/2010  . DEGENERATIVE DISC DISEASE 04/14/2010  . BURSITIS 04/14/2010  . OBESITY, MORBID 03/21/2010  . OBSTRUCTIVE SLEEP APNEA 02/19/2008   Plan: 1. Acute GI Bleed: angio yesterday didn't really identify a bleeding source, still with significant bleeding, down to 6.9 despite 2 units of blood, recommend capsule endoscopy, spoke with Dr. Clementeen Graham who is contacting Dr. Benson Norway.  Will continue to follow.   LOS: 4 days    Kathleene Bergemann 02/09/2013, 7:46 AM

## 2013-02-09 NOTE — Progress Notes (Addendum)
[facility] PROGRESS NOTE  [name] [id number] DOB: 1941-09-03 DOA: 02/05/2013 PCP: Woody Seller, MD  Brief narrative:  72 year old morbidly obese male with hx of valvular heart ds, AS, OSA and anemia admitted with BRBPR and anemia. Received 10 units PRBC since admission. Colonoscopy done on 5/21 showed large amount of blood in colon but no source of bleeding. EGD showed multiple superficial ulcers in proximal duodenum but again no source of bleeding. Patient continues to have BRBPR .   Assessment/Plan:  Acute lower GI bleed  no clear source of bleed. Hemodynamically stable. Has received 12 units PRBC so far. Ordered 2 more today as he continues to drop his H&H. Ordered for bleeding scan but given weight limit ( 330lbs) cannot get the scan in the hospital . ( pts weight is 385 lbs)  CT angiogram done on 5/22 doesnot show any area of bleeding. d/w Dr Benson Norway who plans on capsule endoscopy today.Continue monitor in stepdown. Hb still low at 6.6 . Ordered for 2 more units of PRBC . Iv LASIX 40 MG in between trasnfusions Hold ASA and strictly avoid NSAIDs  continue PPI  Clear liquids.  appreciate GI and surgery consult   Thrombocytopenia Patient continues to drop his platelets. Has not been on anticoagulation. HIT panel ordered. Given uncommon though pertinent side effects of thrombocytopenia with Lipitor and gabapentin i will hold both these medications.   Hypotension  BP low normal . Holding BP meds   Aortic stenosis  TEE from 2011 cannot r/o AS. normal EF. No signs of CHF   Code Status: full code  Family Communication: will call wife  Disposition Plan: currently inpatient Consultants:  Benson Norway / Collene Mares  CCS Procedures:  EGD/ Colonoscopy on 5/21 Antibiotics:  none HPI/Subjective:  Continues to have BRBPR   Objective: Filed Vitals:   02/08/13 2020 02/09/13 0005 02/09/13 0414 02/09/13 0600  BP: 113/46 112/43 110/49   Pulse: 91 90 70   Temp: 100.1 F (37.8 C)  98.9 F (37.2 C) 98.1 F (36.7 C)   TempSrc: Axillary Axillary Axillary   Resp:  23 20   Height:      Weight:    182.6 kg (402 lb 9 oz)  SpO2: 100% 100% 96%     Intake/Output Summary (Last 24 hours) at 02/09/13 0751 Last data filed at 02/09/13 0500  Gross per 24 hour  Intake   3660 ml  Output    501 ml  Net   3159 ml   Filed Weights   02/06/13 0358 02/07/13 0330 02/09/13 0600  Weight: 170.4 kg (375 lb 10.6 oz) 174.7 kg (385 lb 2.3 oz) 182.6 kg (402 lb 9 oz)    Exam:  General: Elderly obese male in NAD  HEENT: pallor+, Moist mucosa  Cardiovascular: Q000111Q, 3/6 systolic murmurs, rubs or gallop  Respiratory: clear b/l, no added sounds  Abdomen: soft, obese, NT, ND, BS+.  Musculoskeletal: warm, chronic venous stasis over b/l leg  CNS: AAOX3   Data Reviewed: Basic Metabolic Panel:  Recent Labs Lab 02/05/13 1546 02/07/13 0950 02/08/13 0355  NA 136 138 136  K 4.9 4.1 3.9  CL 105 108 107  CO2 18* 20 18*  GLUCOSE 120* 105* 113*  BUN 49* 36* 28*  CREATININE 1.54* 1.11 1.09  CALCIUM 7.3* 7.2* 7.0*   Liver Function Tests:  Recent Labs Lab 02/05/13 1546 02/07/13 0950  AST 15 19  ALT 17 16  ALKPHOS 50 43  BILITOT 0.2* 0.5  PROT 4.4* 3.8*  ALBUMIN 2.3*  2.2*   No results found for this basename: LIPASE, AMYLASE,  in the last 168 hours No results found for this basename: AMMONIA,  in the last 168 hours CBC:  Recent Labs Lab 02/05/13 1546  02/07/13 0950 02/08/13 0355 02/08/13 0950 02/08/13 1959 02/09/13 0335  WBC 13.3*  < > 13.3* 11.6* 10.5 8.9 7.9  NEUTROABS 11.6*  --   --   --   --   --   --   HGB 6.0*  < > 7.0* 7.2* 6.9* 7.6* 6.6*  HCT 18.4*  < > 20.0* 20.5* 19.5* 21.6* 18.8*  MCV 92.9  < > 85.8 85.8 86.7 85.4 85.8  PLT 142*  < > 127* 111* 110* 95* 96*  < > = values in this interval not displayed. Cardiac Enzymes: No results found for this basename: CKTOTAL, CKMB, CKMBINDEX, TROPONINI,  in the last 168 hours BNP (last 3 results) No results found  for this basename: PROBNP,  in the last 8760 hours CBG: No results found for this basename: GLUCAP,  in the last 168 hours  Recent Results (from the past 240 hour(s))  MRSA PCR SCREENING     Status: None   Collection Time    02/05/13  9:53 PM      Result Value Range Status   MRSA by PCR NEGATIVE  NEGATIVE Final   Comment:            The GeneXpert MRSA Assay (FDA     approved for NASAL specimens     only), is one component of a     comprehensive MRSA colonization     surveillance program. It is not     intended to diagnose MRSA     infection nor to guide or     monitor treatment for     MRSA infections.     Studies: No results found.  Scheduled Meds: . atorvastatin  40 mg Oral Daily  . cholecalciferol  1,000 Units Oral Daily  . furosemide  40 mg Intravenous Once  . gabapentin  200 mg Oral QHS  . sodium chloride  3 mL Intravenous Q12H   Continuous Infusions: . sodium chloride 20 mL/hr at 02/06/13 2024      Time spent: 35 minutes    [name]  [facility] Pager [phone]. If 7PM-7AM, please contact night-coverage at [email or web address], password [facility] 02/09/2013, 7:51 AM  LOS: 4 days

## 2013-02-09 NOTE — H&P (View-Only) (Signed)
[facility] PROGRESS NOTE  [name] [id number] DOB: 08-30-1941 DOA: 02/05/2013 PCP: Woody Seller, MD  Brief narrative:  72 year old morbidly obese male with hx of valvular heart ds, AS, OSA and anemia admitted with BRBPR and anemia. Received 10 units PRBC since admission. Colonoscopy done on 5/21 showed large amount of blood in colon but no source of bleeding. EGD showed multiple superficial ulcers in proximal duodenum but again no source of bleeding. Patient continues to have BRBPR .   Assessment/Plan:  Acute lower GI bleed  no clear source of bleed. Hemodynamically stable. Has received 12 units PRBC so far. Ordered 2 more today as he continues to drop his H&H. Ordered for bleeding scan but given weight limit ( 330lbs) cannot get the scan in the hospital . ( pts weight is 385 lbs)  CT angiogram done on 5/22 doesnot show any area of bleeding. d/w Dr Benson Norway who plans on capsule endoscopy today.Continue monitor in stepdown. Hb still low at 6.6 . Ordered for 2 more units of PRBC . Iv LASIX 40 MG in between trasnfusions Hold ASA and strictly avoid NSAIDs  continue PPI  Clear liquids.  appreciate GI and surgery consult   Thrombocytopenia Patient continues to drop his platelets. Has not been on anticoagulation. HIT panel ordered. Given uncommon though pertinent side effects of thrombocytopenia with Lipitor and gabapentin i will hold both these medications.   Hypotension  BP low normal . Holding BP meds   Aortic stenosis  TEE from 2011 cannot r/o AS. normal EF. No signs of CHF   Code Status: full code  Family Communication: will call wife  Disposition Plan: currently inpatient Consultants:  Benson Norway / Collene Mares  CCS Procedures:  EGD/ Colonoscopy on 5/21 Antibiotics:  none HPI/Subjective:  Continues to have BRBPR   Objective: Filed Vitals:   02/08/13 2020 02/09/13 0005 02/09/13 0414 02/09/13 0600  BP: 113/46 112/43 110/49   Pulse: 91 90 70   Temp: 100.1 F (37.8 C)  98.9 F (37.2 C) 98.1 F (36.7 C)   TempSrc: Axillary Axillary Axillary   Resp:  23 20   Height:      Weight:    182.6 kg (402 lb 9 oz)  SpO2: 100% 100% 96%     Intake/Output Summary (Last 24 hours) at 02/09/13 0751 Last data filed at 02/09/13 0500  Gross per 24 hour  Intake   3660 ml  Output    501 ml  Net   3159 ml   Filed Weights   02/06/13 0358 02/07/13 0330 02/09/13 0600  Weight: 170.4 kg (375 lb 10.6 oz) 174.7 kg (385 lb 2.3 oz) 182.6 kg (402 lb 9 oz)    Exam:  General: Elderly obese male in NAD  HEENT: pallor+, Moist mucosa  Cardiovascular: Q000111Q, 3/6 systolic murmurs, rubs or gallop  Respiratory: clear b/l, no added sounds  Abdomen: soft, obese, NT, ND, BS+.  Musculoskeletal: warm, chronic venous stasis over b/l leg  CNS: AAOX3   Data Reviewed: Basic Metabolic Panel:  Recent Labs Lab 02/05/13 1546 02/07/13 0950 02/08/13 0355  NA 136 138 136  K 4.9 4.1 3.9  CL 105 108 107  CO2 18* 20 18*  GLUCOSE 120* 105* 113*  BUN 49* 36* 28*  CREATININE 1.54* 1.11 1.09  CALCIUM 7.3* 7.2* 7.0*   Liver Function Tests:  Recent Labs Lab 02/05/13 1546 02/07/13 0950  AST 15 19  ALT 17 16  ALKPHOS 50 43  BILITOT 0.2* 0.5  PROT 4.4* 3.8*  ALBUMIN 2.3*  2.2*   No results found for this basename: LIPASE, AMYLASE,  in the last 168 hours No results found for this basename: AMMONIA,  in the last 168 hours CBC:  Recent Labs Lab 02/05/13 1546  02/07/13 0950 02/08/13 0355 02/08/13 0950 02/08/13 1959 02/09/13 0335  WBC 13.3*  < > 13.3* 11.6* 10.5 8.9 7.9  NEUTROABS 11.6*  --   --   --   --   --   --   HGB 6.0*  < > 7.0* 7.2* 6.9* 7.6* 6.6*  HCT 18.4*  < > 20.0* 20.5* 19.5* 21.6* 18.8*  MCV 92.9  < > 85.8 85.8 86.7 85.4 85.8  PLT 142*  < > 127* 111* 110* 95* 96*  < > = values in this interval not displayed. Cardiac Enzymes: No results found for this basename: CKTOTAL, CKMB, CKMBINDEX, TROPONINI,  in the last 168 hours BNP (last 3 results) No results found  for this basename: PROBNP,  in the last 8760 hours CBG: No results found for this basename: GLUCAP,  in the last 168 hours  Recent Results (from the past 240 hour(s))  MRSA PCR SCREENING     Status: None   Collection Time    02/05/13  9:53 PM      Result Value Range Status   MRSA by PCR NEGATIVE  NEGATIVE Final   Comment:            The GeneXpert MRSA Assay (FDA     approved for NASAL specimens     only), is one component of a     comprehensive MRSA colonization     surveillance program. It is not     intended to diagnose MRSA     infection nor to guide or     monitor treatment for     MRSA infections.     Studies: No results found.  Scheduled Meds: . atorvastatin  40 mg Oral Daily  . cholecalciferol  1,000 Units Oral Daily  . furosemide  40 mg Intravenous Once  . gabapentin  200 mg Oral QHS  . sodium chloride  3 mL Intravenous Q12H   Continuous Infusions: . sodium chloride 20 mL/hr at 02/06/13 2024      Time spent: 35 minutes    [name]  [facility] Pager [phone]. If 7PM-7AM, please contact night-coverage at [email or web address], password [facility] 02/09/2013, 7:51 AM  LOS: 4 days

## 2013-02-09 NOTE — Interval H&P Note (Signed)
History and Physical Interval Note:  02/09/2013 4:19 PM  [name]  has presented today for surgery, with the diagnosis of Persistent hematochezia  The various methods of treatment have been discussed with the patient and family. After consideration of risks, benefits and other options for treatment, the patient has consented to  Procedure(s): COLONOSCOPY (N/A) as a surgical intervention .  The patient's history has been reviewed, patient examined, no change in status, stable for surgery.  I have reviewed the patient's chart and labs.  Questions were answered to the patient's satisfaction.    I had a discussion with the patient and his wife.  I am suspicious that his bleeding is from a lower GI source.  His capsule endoscopy is still pending and he is currently on his 16th unit of PRBC.  I will attempt to localize the bleeding site with a repeat colonoscopy.  [name]

## 2013-02-09 NOTE — Progress Notes (Signed)
He continues to have bleeding with no localization, capsule underway, he is high operative risk but one of next steps is going to be surgery with enteroscopy.  I discussed this with them

## 2013-02-09 NOTE — Progress Notes (Addendum)
CRITICAL VALUE ALERT  Critical value received:  Hemoglobin: 6.6  Date of notification:  02/09/2013   Time of notification:  4:49 AM   Critical value read back:yes  Nurse who received alert:  Eliane Decree, RN   MD notified (1st page):  Lamar Blinks, NP via text page  Time of first page:  [phone]  MD notified (2nd page):   Time of second page:  Responding MD:  Lamar Blinks, NP  Time MD responded:  0501 via orders

## 2013-02-10 DIAGNOSIS — Q2733 Arteriovenous malformation of digestive system vessel: Secondary | ICD-10-CM

## 2013-02-10 DIAGNOSIS — K5521 Angiodysplasia of colon with hemorrhage: Secondary | ICD-10-CM

## 2013-02-10 LAB — CBC
HCT: 19.6 % — ABNORMAL LOW (ref 39.0–52.0)
HCT: 23.4 % — ABNORMAL LOW (ref 39.0–52.0)
HCT: 23.7 % — ABNORMAL LOW (ref 39.0–52.0)
Hemoglobin: 6.9 g/dL — CL (ref 13.0–17.0)
Hemoglobin: 8.2 g/dL — ABNORMAL LOW (ref 13.0–17.0)
Hemoglobin: 8.3 g/dL — ABNORMAL LOW (ref 13.0–17.0)
MCH: 30.1 pg (ref 26.0–34.0)
MCH: 30.1 pg (ref 26.0–34.0)
MCH: 30.2 pg (ref 26.0–34.0)
MCHC: 34.7 g/dL (ref 30.0–36.0)
MCHC: 35 g/dL (ref 30.0–36.0)
MCHC: 35 g/dL (ref 30.0–36.0)
MCV: 86.7 fL (ref 78.0–100.0)
MCV: 86 fL (ref 78.0–100.0)
MCV: 86.2 fL (ref 78.0–100.0)
Platelets: 92 10*3/uL — ABNORMAL LOW (ref 150–400)
Platelets: 95 10*3/uL — ABNORMAL LOW (ref 150–400)
Platelets: 105 10*3/uL — ABNORMAL LOW (ref 150–400)
RBC: 2.26 MIL/uL — ABNORMAL LOW (ref 4.22–5.81)
RBC: 2.72 MIL/uL — ABNORMAL LOW (ref 4.22–5.81)
RBC: 2.75 MIL/uL — ABNORMAL LOW (ref 4.22–5.81)
RDW: 17.4 % — ABNORMAL HIGH (ref 11.5–15.5)
RDW: 16.5 % — ABNORMAL HIGH (ref 11.5–15.5)
RDW: 16.7 % — ABNORMAL HIGH (ref 11.5–15.5)
WBC: 6.7 10*3/uL (ref 4.0–10.5)
WBC: 7.1 10*3/uL (ref 4.0–10.5)
WBC: 6.6 10*3/uL (ref 4.0–10.5)

## 2013-02-10 LAB — GLUCOSE, CAPILLARY: Glucose-Capillary: 93 mg/dL (ref 70–99)

## 2013-02-10 LAB — PREPARE RBC (CROSSMATCH)

## 2013-02-10 MED ORDER — GABAPENTIN 100 MG PO CAPS
200.0000 mg | ORAL_CAPSULE | Freq: Two times a day (BID) | ORAL | Status: DC
  Administered 2013-02-10 – 2013-02-16 (×12): 200 mg via ORAL
  Filled 2013-02-10 (×13): qty 2

## 2013-02-10 MED ORDER — ACETAMINOPHEN 325 MG PO TABS
650.0000 mg | ORAL_TABLET | Freq: Four times a day (QID) | ORAL | Status: DC | PRN
  Administered 2013-02-10 – 2013-02-15 (×4): 650 mg via ORAL
  Filled 2013-02-10 (×3): qty 2

## 2013-02-10 MED ORDER — GABAPENTIN 100 MG PO CAPS
200.0000 mg | ORAL_CAPSULE | Freq: Every day | ORAL | Status: DC
  Filled 2013-02-10: qty 2

## 2013-02-10 NOTE — Progress Notes (Signed)
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 Hgb 6.9 this am ( was 6.9 and 7.3 yesterday), without signs of active bleeding, receiving 2 units of pRBC's. Vital signs in last 24 hours: Temp:  [97.4 F (36.3 C)-99 F (37.2 C)] 97.7 F (36.5 C) (05/24 0800) Pulse Rate:  [63-98] 71 (05/24 0800) Resp:  [13-39] 21 (05/24 0800) BP: (83-143)/(30-91) 115/69 mmHg (05/24 0800) SpO2:  [95 %-100 %] 99 % (05/24 0433) Weight:  [392 lb 6.7 oz (178 kg)] 392 lb 6.7 oz (178 kg) (05/24 0015) Last BM Date: 02/09/13 General:    whitemale in NAD, morbidly obese Heart:  Regular rate and rhythm; no murmurs Lungs: Respirations even and unlabored, lungs CTA bilaterally Abdomen:  Soft, nontender and nondistended. Normal bowel sounds.massively obese Extremities: one plusedema. Neurologic:  Alert and oriented,  grossly normal neurologically. Psych:  Cooperative. Normal mood and affect.  Intake/Output from previous day: 05/23 0701 - 05/24 0700 In: 1813 [P.O.:360; I.V.:403; Blood:1050] Out: 2070 [Urine:2070] Intake/Output this shift: Total I/O In: 512.5 [I.V.:500; Blood:12.5] Out: -   Lab Results:  Recent Labs  02/09/13 1100 02/09/13 2044 02/10/13 0330  WBC 8.7 7.6 6.7  HGB 6.7* 7.3* 6.9*  HCT 19.2* 20.5* 19.6*  PLT 110* 92* 92*   BMET  Recent Labs  02/07/13 0950 02/08/13 0355  NA 138 136  K 4.1 3.9  CL 108 107  CO2 20 18*  GLUCOSE 105* 113*  BUN 36* 28*  CREATININE 1.11 1.09  CALCIUM 7.2* 7.0*   LFT  Recent Labs  02/07/13 0950  PROT 3.8*  ALBUMIN 2.2*  AST 19  ALT 16  ALKPHOS 43  BILITOT 0.5   PT/INR No results found for this basename: LABPROT, INR,  in the last 72 hours  Studies/Results: Ir Angiogram Visceral Selective  02/09/2013   *RADIOLOGY REPORT*  Clinical Data: Acute GI bleeding, source cannot be localized endoscopically.   The patient too large for tagged red blood cell scintigraphy.  SELECTIVE VISCERAL ARTERIOGRAPHY,IR ULTRASOUND GUIDANCE VASC ACCESS RIGHT  Comparison: None.  Approach:  Right common femoral artery  Vessels catheterized:  Superior mesenteric artery, inferior mesenteric artery, celiac axis  Technique and findings: The procedure, risks (including but not limited to bleeding, infection, organ damage), benefits, and alternatives were explained to the patient.  Questions regarding the procedure were encouraged and answered.  The patient understands and consents to the procedure.Right femoral region prepped and draped in usual sterile fashion. Maximal barrier sterile technique was utilized including caps, mask, sterile gowns, sterile gloves, sterile drape, hand hygiene and skin antiseptic.  Intravenous Fentanyl and Versed were administered as conscious sedation during continuous cardiorespiratory monitoring by the radiology RN, with a total moderate sedation time of 47 minutes.  Under real time ultrasound guidance, the right common femoral artery was accessed with 19 gauge percutaneous entry needle using single-wall technique in a single pass.  Needle exchanged over [facility] wire for a 5-French vascular sheath, through which a 5- [place] C2 catheter was advanced in attempts to catheterize the superior mesenteric artery.  This was exchanged for a Sos catheter, and ultimately for a Simmons II catheter, for selective catheterization of the   superior mesenteric artery, inferior mesenteric artery, and celiac axis for selective arteriography of these distribution   in multiple projections.  There is no evidence of active extravasation.  No AVM or  other arterial lesion is identified.  No significant atheromatous irregularity.  No aneurysm or stenosis.  The venous phase is unremarkable.  The catheter and sheath were removed and hemostasis achieved with the Exoseal device after confirmatory femoral arteriography. The patient tolerated  the procedure well.  No immediate complication.  IMPRESSION:  No evidence of active extravasation or other focal arterial lesion.  The findings were reviewed with Dr. Grandville Silos post procedure.   Original Report Authenticated By: D. Wallace Going, MD   Ir Angiogram Visceral Selective  02/09/2013   *RADIOLOGY REPORT*  Clinical Data: Acute GI bleeding, source cannot be localized endoscopically.  The patient too large for tagged red blood cell scintigraphy.  SELECTIVE VISCERAL ARTERIOGRAPHY,IR ULTRASOUND GUIDANCE VASC ACCESS RIGHT  Comparison: None.  Approach:  Right common femoral artery  Vessels catheterized:  Superior mesenteric artery, inferior mesenteric artery, celiac axis  Technique and findings: The procedure, risks (including but not limited to bleeding, infection, organ damage), benefits, and alternatives were explained to the patient.  Questions regarding the procedure were encouraged and answered.  The patient understands and consents to the procedure.Right femoral region prepped and draped in usual sterile fashion. Maximal barrier sterile technique was utilized including caps, mask, sterile gowns, sterile gloves, sterile drape, hand hygiene and skin antiseptic.  Intravenous Fentanyl and Versed were administered as conscious sedation during continuous cardiorespiratory monitoring by the radiology RN, with a total moderate sedation time of 47 minutes.  Under real time ultrasound guidance, the right common femoral artery was accessed with 19 gauge percutaneous entry needle using single-wall technique in a single pass.  Needle exchanged over [facility] wire for a 5-French vascular sheath, through which a 5- [place] C2 catheter was advanced in attempts to catheterize the superior mesenteric artery.  This was exchanged for a Sos catheter, and ultimately for a Simmons II catheter, for selective catheterization of the   superior mesenteric artery, inferior mesenteric artery, and celiac axis for selective arteriography  of these distribution   in multiple projections.  There is no evidence of active extravasation.  No AVM or other arterial lesion is identified.  No significant atheromatous irregularity.  No aneurysm or stenosis.  The venous phase is unremarkable.  The catheter and sheath were removed and hemostasis achieved with the Exoseal device after confirmatory femoral arteriography. The patient tolerated the procedure well.  No immediate complication.  IMPRESSION:  No evidence of active extravasation or other focal arterial lesion.  The findings were reviewed with Dr. Grandville Silos post procedure.   Original Report Authenticated By: D. Wallace Going, MD   Ir Angiogram Visceral Selective  02/09/2013   *RADIOLOGY REPORT*  Clinical Data: Acute GI bleeding, source cannot be localized endoscopically.  The patient too large for tagged red blood cell scintigraphy.  SELECTIVE VISCERAL ARTERIOGRAPHY,IR ULTRASOUND GUIDANCE VASC ACCESS RIGHT  Comparison: None.  Approach:  Right common femoral artery  Vessels catheterized:  Superior mesenteric artery, inferior mesenteric artery, celiac axis  Technique and findings: The procedure, risks (including but not limited to bleeding, infection, organ damage), benefits, and alternatives were explained to the patient.  Questions regarding the procedure were encouraged and answered.  The patient understands and consents to the procedure.Right femoral region prepped and draped in usual sterile fashion. Maximal barrier sterile technique was utilized including caps, mask, sterile gowns, sterile gloves, sterile drape, hand hygiene and skin antiseptic.  Intravenous Fentanyl and Versed were administered as conscious sedation during continuous cardiorespiratory monitoring by the radiology RN, with a total moderate sedation time of 47 minutes.  Under real time ultrasound guidance, the right common femoral artery was accessed with 19 gauge percutaneous entry needle using single-wall technique in a single pass.   Needle exchanged over [facility] wire for a 5-French vascular sheath, through which a 5- [place] C2 catheter was advanced in attempts to catheterize the superior mesenteric artery.  This was exchanged for a Sos catheter, and ultimately for a Simmons II catheter, for selective catheterization of the   superior mesenteric artery, inferior mesenteric artery, and celiac axis for selective arteriography of these distribution   in multiple projections.  There is no evidence of active extravasation.  No AVM or other arterial lesion is identified.  No significant atheromatous irregularity.  No aneurysm or stenosis.  The venous phase is unremarkable.  The catheter and sheath were removed and hemostasis achieved with the Exoseal device after confirmatory femoral arteriography. The patient tolerated the procedure well.  No immediate complication.  IMPRESSION:  No evidence of active extravasation or other focal arterial lesion.  The findings were reviewed with Dr. Grandville Silos post procedure.   Original Report Authenticated By: D. Wallace Going, MD   Ir US Guide Vasc Access Right  02/09/2013   *RADIOLOGY REPORT*  Clinical Data: Acute GI bleeding, source cannot be localized endoscopically.  The patient too large for tagged red blood cell scintigraphy.  SELECTIVE VISCERAL ARTERIOGRAPHY,IR ULTRASOUND GUIDANCE VASC ACCESS RIGHT  Comparison: None.  Approach:  Right common femoral artery  Vessels catheterized:  Superior mesenteric artery, inferior mesenteric artery, celiac axis  Technique and findings: The procedure, risks (including but not limited to bleeding, infection, organ damage), benefits, and alternatives were explained to the patient.  Questions regarding the procedure were encouraged and answered.  The patient understands and consents to the procedure.Right femoral region prepped and draped in usual sterile fashion. Maximal barrier sterile technique was utilized including caps, mask, sterile gowns, sterile gloves, sterile drape,  hand hygiene and skin antiseptic.  Intravenous Fentanyl and Versed were administered as conscious sedation during continuous cardiorespiratory monitoring by the radiology RN, with a total moderate sedation time of 47 minutes.  Under real time ultrasound guidance, the right common femoral artery was accessed with 19 gauge percutaneous entry needle using single-wall technique in a single pass.  Needle exchanged over [facility] wire for a 5-French vascular sheath, through which a 5- [place] C2 catheter was advanced in attempts to catheterize the superior mesenteric artery.  This was exchanged for a Sos catheter, and ultimately for a Simmons II catheter, for selective catheterization of the   superior mesenteric artery, inferior mesenteric artery, and celiac axis for selective arteriography of these distribution   in multiple projections.  There is no evidence of active extravasation.  No AVM or other arterial lesion is identified.  No significant atheromatous irregularity.  No aneurysm or stenosis.  The venous phase is unremarkable.  The catheter and sheath were removed and hemostasis achieved with the Exoseal device after confirmatory femoral arteriography. The patient tolerated the procedure well.  No immediate complication.  IMPRESSION:  No evidence of active extravasation or other focal arterial lesion.  The findings were reviewed with Dr. Grandville Silos post procedure.   Original Report Authenticated By: D. Wallace Going, MD       Assessment:  S/p major LGIB, now stable but eqilibrating, cecal avm ablated but  He is prone to develop small bowl avm's in the future (SBCE was negative).     Plan:  follow H/H Stay on clear liquids today   Principal Problem:   Acute  lower gastrointestinal bleeding Active Problems:   OBESITY, MORBID   AORTIC STENOSIS   VALVULAR HEART DISEASE   Acute blood loss anemia   Thrombocytopenia, unspecified     LOS: 5 days   Delfin Edis  02/10/2013, 8:24 AM

## 2013-02-10 NOTE — Progress Notes (Signed)
1 Day Post-Op  Subjective: Scope by Dr Benson Norway noted, capsule results noted  Objective: Vital signs in last 24 hours: Temp:  [97.4 F (36.3 C)-99 F (37.2 C)] 98.7 F (37.1 C) (05/24 0815) Pulse Rate:  [57-98] 57 (05/24 0815) Resp:  [13-39] 19 (05/24 0815) BP: (83-143)/(30-91) 115/69 mmHg (05/24 0800) SpO2:  [95 %-100 %] 99 % (05/24 0433) Weight:  [392 lb 6.7 oz (178 kg)] 392 lb 6.7 oz (178 kg) (05/24 0015) Last BM Date: 02/09/13  Intake/Output from previous day: 05/23 0701 - 05/24 0700 In: 1813 [P.O.:360; I.V.:403; Blood:1050] Out: 2070 [Urine:2070] Intake/Output this shift: Total I/O In: 612.5 [I.V.:500; Blood:112.5] Out: -   GI: soft nontender  Lab Results:   Recent Labs  02/09/13 2044 02/10/13 0330  WBC 7.6 6.7  HGB 7.3* 6.9*  HCT 20.5* 19.6*  PLT 92* 92*   BMET  Recent Labs  02/07/13 0950 02/08/13 0355  NA 138 136  K 4.1 3.9  CL 108 107  CO2 20 18*  GLUCOSE 105* 113*  BUN 36* 28*  CREATININE 1.11 1.09  CALCIUM 7.2* 7.0*   PT/INR No results found for this basename: LABPROT, INR,  in the last 72 hours ABG No results found for this basename: PHART, PCO2, PO2, HCO3,  in the last 72 hours  Studies/Results: Ir Angiogram Visceral Selective  02/09/2013   *RADIOLOGY REPORT*  Clinical Data: Acute GI bleeding, source cannot be localized endoscopically.  The patient too large for tagged red blood cell scintigraphy.  SELECTIVE VISCERAL ARTERIOGRAPHY,IR ULTRASOUND GUIDANCE VASC ACCESS RIGHT  Comparison: None.  Approach:  Right common femoral artery  Vessels catheterized:  Superior mesenteric artery, inferior mesenteric artery, celiac axis  Technique and findings: The procedure, risks (including but not limited to bleeding, infection, organ damage), benefits, and alternatives were explained to the patient.  Questions regarding the procedure were encouraged and answered.  The patient understands and consents to the procedure.Right femoral region prepped and draped in  usual sterile fashion. Maximal barrier sterile technique was utilized including caps, mask, sterile gowns, sterile gloves, sterile drape, hand hygiene and skin antiseptic.  Intravenous Fentanyl and Versed were administered as conscious sedation during continuous cardiorespiratory monitoring by the radiology RN, with a total moderate sedation time of 47 minutes.  Under real time ultrasound guidance, the right common femoral artery was accessed with 19 gauge percutaneous entry needle using single-wall technique in a single pass.  Needle exchanged over [facility] wire for a 5-French vascular sheath, through which a 5- [place] C2 catheter was advanced in attempts to catheterize the superior mesenteric artery.  This was exchanged for a Sos catheter, and ultimately for a Simmons II catheter, for selective catheterization of the   superior mesenteric artery, inferior mesenteric artery, and celiac axis for selective arteriography of these distribution   in multiple projections.  There is no evidence of active extravasation.  No AVM or other arterial lesion is identified.  No significant atheromatous irregularity.  No aneurysm or stenosis.  The venous phase is unremarkable.  The catheter and sheath were removed and hemostasis achieved with the Exoseal device after confirmatory femoral arteriography. The patient tolerated the procedure well.  No immediate complication.  IMPRESSION:  No evidence of active extravasation or other focal arterial lesion.  The findings were reviewed with Dr. Grandville Silos post procedure.   Original Report Authenticated By: D. Wallace Going, MD   Ir Angiogram Visceral Selective  02/09/2013   *RADIOLOGY REPORT*  Clinical Data: Acute GI bleeding, source cannot be localized endoscopically.  The patient too large for tagged red blood cell scintigraphy.  SELECTIVE VISCERAL ARTERIOGRAPHY,IR ULTRASOUND GUIDANCE VASC ACCESS RIGHT  Comparison: None.  Approach:  Right common femoral artery  Vessels catheterized:   Superior mesenteric artery, inferior mesenteric artery, celiac axis  Technique and findings: The procedure, risks (including but not limited to bleeding, infection, organ damage), benefits, and alternatives were explained to the patient.  Questions regarding the procedure were encouraged and answered.  The patient understands and consents to the procedure.Right femoral region prepped and draped in usual sterile fashion. Maximal barrier sterile technique was utilized including caps, mask, sterile gowns, sterile gloves, sterile drape, hand hygiene and skin antiseptic.  Intravenous Fentanyl and Versed were administered as conscious sedation during continuous cardiorespiratory monitoring by the radiology RN, with a total moderate sedation time of 47 minutes.  Under real time ultrasound guidance, the right common femoral artery was accessed with 19 gauge percutaneous entry needle using single-wall technique in a single pass.  Needle exchanged over [facility] wire for a 5-French vascular sheath, through which a 5- [place] C2 catheter was advanced in attempts to catheterize the superior mesenteric artery.  This was exchanged for a Sos catheter, and ultimately for a Simmons II catheter, for selective catheterization of the   superior mesenteric artery, inferior mesenteric artery, and celiac axis for selective arteriography of these distribution   in multiple projections.  There is no evidence of active extravasation.  No AVM or other arterial lesion is identified.  No significant atheromatous irregularity.  No aneurysm or stenosis.  The venous phase is unremarkable.  The catheter and sheath were removed and hemostasis achieved with the Exoseal device after confirmatory femoral arteriography. The patient tolerated the procedure well.  No immediate complication.  IMPRESSION:  No evidence of active extravasation or other focal arterial lesion.  The findings were reviewed with Dr. Grandville Silos post procedure.   Original Report  Authenticated By: D. Wallace Going, MD   Ir Angiogram Visceral Selective  02/09/2013   *RADIOLOGY REPORT*  Clinical Data: Acute GI bleeding, source cannot be localized endoscopically.  The patient too large for tagged red blood cell scintigraphy.  SELECTIVE VISCERAL ARTERIOGRAPHY,IR ULTRASOUND GUIDANCE VASC ACCESS RIGHT  Comparison: None.  Approach:  Right common femoral artery  Vessels catheterized:  Superior mesenteric artery, inferior mesenteric artery, celiac axis  Technique and findings: The procedure, risks (including but not limited to bleeding, infection, organ damage), benefits, and alternatives were explained to the patient.  Questions regarding the procedure were encouraged and answered.  The patient understands and consents to the procedure.Right femoral region prepped and draped in usual sterile fashion. Maximal barrier sterile technique was utilized including caps, mask, sterile gowns, sterile gloves, sterile drape, hand hygiene and skin antiseptic.  Intravenous Fentanyl and Versed were administered as conscious sedation during continuous cardiorespiratory monitoring by the radiology RN, with a total moderate sedation time of 47 minutes.  Under real time ultrasound guidance, the right common femoral artery was accessed with 19 gauge percutaneous entry needle using single-wall technique in a single pass.  Needle exchanged over [facility] wire for a 5-French vascular sheath, through which a 5- [place] C2 catheter was advanced in attempts to catheterize the superior mesenteric artery.  This was exchanged for a Sos catheter, and ultimately for a Simmons II catheter, for selective catheterization of the   superior mesenteric artery, inferior mesenteric artery, and celiac axis for selective arteriography of these distribution   in multiple projections.  There is no evidence of active extravasation.  No  AVM or other arterial lesion is identified.  No significant atheromatous irregularity.  No aneurysm or  stenosis.  The venous phase is unremarkable.  The catheter and sheath were removed and hemostasis achieved with the Exoseal device after confirmatory femoral arteriography. The patient tolerated the procedure well.  No immediate complication.  IMPRESSION:  No evidence of active extravasation or other focal arterial lesion.  The findings were reviewed with Dr. Grandville Silos post procedure.   Original Report Authenticated By: D. Wallace Going, MD   Ir US Guide Vasc Access Right  02/09/2013   *RADIOLOGY REPORT*  Clinical Data: Acute GI bleeding, source cannot be localized endoscopically.  The patient too large for tagged red blood cell scintigraphy.  SELECTIVE VISCERAL ARTERIOGRAPHY,IR ULTRASOUND GUIDANCE VASC ACCESS RIGHT  Comparison: None.  Approach:  Right common femoral artery  Vessels catheterized:  Superior mesenteric artery, inferior mesenteric artery, celiac axis  Technique and findings: The procedure, risks (including but not limited to bleeding, infection, organ damage), benefits, and alternatives were explained to the patient.  Questions regarding the procedure were encouraged and answered.  The patient understands and consents to the procedure.Right femoral region prepped and draped in usual sterile fashion. Maximal barrier sterile technique was utilized including caps, mask, sterile gowns, sterile gloves, sterile drape, hand hygiene and skin antiseptic.  Intravenous Fentanyl and Versed were administered as conscious sedation during continuous cardiorespiratory monitoring by the radiology RN, with a total moderate sedation time of 47 minutes.  Under real time ultrasound guidance, the right common femoral artery was accessed with 19 gauge percutaneous entry needle using single-wall technique in a single pass.  Needle exchanged over [facility] wire for a 5-French vascular sheath, through which a 5- [place] C2 catheter was advanced in attempts to catheterize the superior mesenteric artery.  This was exchanged for a Sos  catheter, and ultimately for a Simmons II catheter, for selective catheterization of the   superior mesenteric artery, inferior mesenteric artery, and celiac axis for selective arteriography of these distribution   in multiple projections.  There is no evidence of active extravasation.  No AVM or other arterial lesion is identified.  No significant atheromatous irregularity.  No aneurysm or stenosis.  The venous phase is unremarkable.  The catheter and sheath were removed and hemostasis achieved with the Exoseal device after confirmatory femoral arteriography. The patient tolerated the procedure well.  No immediate complication.  IMPRESSION:  No evidence of active extravasation or other focal arterial lesion.  The findings were reviewed with Dr. Grandville Silos post procedure.   Original Report Authenticated By: D. Wallace Going, MD    Anti-infectives: Anti-infectives   None      Assessment/Plan: Gi bleed Hopefully this has stopped with dr [email or web address] his help and persistence. Will continue to follow.  If rebleeds this appears to be cecum  [facility] 02/10/2013

## 2013-02-10 NOTE — Progress Notes (Signed)
[facility] PROGRESS NOTE  [name] [id number] DOB: 1941-08-23 DOA: 02/05/2013 PCP: Woody Seller, MD  Brief narrative:  72 year old morbidly obese male with hx of valvular heart ds, AS, OSA and anemia admitted with BRBPR and anemia. Received 10 units PRBC since admission. Colonoscopy done on 5/21 showed large amount of blood in colon but no source of bleeding. EGD showed multiple superficial ulcers in proximal duodenum but again no source of bleeding. Patient continues to have BRBPR .   Assessment/Plan:  Acute lower GI bleed  no clear source of bleed. Hemodynamically stable. Has received 16  units PRBC so far. ( including 2 units ordered this morning) Ordered for bleeding scan but given weight limit ( 330lbs) cannot get the scan in the hospital . ( pts weight is 385 lbs)  CT angiogram done on 5/22 doesnot show any area of bleeding. Capsule endoscopy on 5/23 negative per GI. Repeat colonoscopy done on 5/23 showed cecal AVMs which was ablated. Hold ASA and strictly avoid NSAIDs  continue PPI. Advance diet  appreciate GI and surgery consult. Monitor H&H closely.  Thrombocytopenia  Patient continues to drop his platelets. Has not been on anticoagulation. HIT panel ordered. Given uncommon though pertinent side effects of thrombocytopenia with Lipitor and gabapentin i will hold both these medications.   Hypotension  . Holding BP meds . Currently stable  Aortic stenosis  TEE from 2011 cannot r/o AS. normal EF. No signs of CHF   Code Status: full code  Family Communication: none at bedside Disposition Plan: currently inpatient   Consultants:  Benson Norway / Collene Mares  CCS   Procedures:  EGD/ Colonoscopy on 5/21 Antibiotics:  none HPI/Subjective:  Had repeat colonoscopy done yesterday with cecal AVMs noted which was ablated. Capsule study done as well. Patient reports passing old blood clots and no fresh bleeding PR  Objective: Filed Vitals:   02/10/13 0800 02/10/13 0815  02/10/13 0900 02/10/13 1000  BP: 115/69 127/40 113/65 127/56  Pulse: 71 57 58 90  Temp: 97.7 F (36.5 C) 98.7 F (37.1 C) 97.7 F (36.5 C) 98.3 F (36.8 C)  TempSrc: Oral Oral Oral Oral  Resp: 21 19 21 15   Height:      Weight:      SpO2: 97%  97% 97%    Intake/Output Summary (Last 24 hours) at 02/10/13 1120 Last data filed at 02/10/13 1000  Gross per 24 hour  Intake 2322.5 ml  Output   1660 ml  Net  662.5 ml   Filed Weights   02/07/13 0330 02/09/13 0600 02/10/13 0015  Weight: 174.7 kg (385 lb 2.3 oz) 182.6 kg (402 lb 9 oz) 178 kg (392 lb 6.7 oz)    Exam:  General: Elderly obese male in NAD  HEENT: pallor+, Moist mucosa  Cardiovascular: Q000111Q, 3/6 systolic murmurs, rubs or gallop  Respiratory: clear b/l, no added sounds  Abdomen: soft, obese, NT, ND, BS+.  Musculoskeletal: warm, chronic venous stasis over b/l leg  CNS: AAOX3   Data Reviewed: Basic Metabolic Panel:  Recent Labs Lab 02/05/13 1546 02/07/13 0950 02/08/13 0355  NA 136 138 136  K 4.9 4.1 3.9  CL 105 108 107  CO2 18* 20 18*  GLUCOSE 120* 105* 113*  BUN 49* 36* 28*  CREATININE 1.54* 1.11 1.09  CALCIUM 7.3* 7.2* 7.0*   Liver Function Tests:  Recent Labs Lab 02/05/13 1546 02/07/13 0950  AST 15 19  ALT 17 16  ALKPHOS 50 43  BILITOT 0.2* 0.5  PROT 4.4* 3.8*  ALBUMIN 2.3* 2.2*   No results found for this basename: LIPASE, AMYLASE,  in the last 168 hours No results found for this basename: AMMONIA,  in the last 168 hours CBC:  Recent Labs Lab 02/05/13 1546  02/08/13 1959 02/09/13 0335 02/09/13 1100 02/09/13 2044 02/10/13 0330  WBC 13.3*  < > 8.9 7.9 8.7 7.6 6.7  NEUTROABS 11.6*  --   --   --   --   --   --   HGB 6.0*  < > 7.6* 6.6* 6.7* 7.3* 6.9*  HCT 18.4*  < > 21.6* 18.8* 19.2* 20.5* 19.6*  MCV 92.9  < > 85.4 85.8 86.1 86.9 86.7  PLT 142*  < > 95* 96* 110* 92* 92*  < > = values in this interval not displayed. Cardiac Enzymes: No results found for this basename: CKTOTAL,  CKMB, CKMBINDEX, TROPONINI,  in the last 168 hours BNP (last 3 results) No results found for this basename: PROBNP,  in the last 8760 hours CBG:  Recent Labs Lab 02/10/13 0521  GLUCAP 93    Recent Results (from the past 240 hour(s))  MRSA PCR SCREENING     Status: None   Collection Time    02/05/13  9:53 PM      Result Value Range Status   MRSA by PCR NEGATIVE  NEGATIVE Final   Comment:            The GeneXpert MRSA Assay (FDA     approved for NASAL specimens     only), is one component of a     comprehensive MRSA colonization     surveillance program. It is not     intended to diagnose MRSA     infection nor to guide or     monitor treatment for     MRSA infections.     Studies: Ir Angiogram Visceral Selective  02/09/2013   *RADIOLOGY REPORT*  Clinical Data: Acute GI bleeding, source cannot be localized endoscopically.  The patient too large for tagged red blood cell scintigraphy.  SELECTIVE VISCERAL ARTERIOGRAPHY,IR ULTRASOUND GUIDANCE VASC ACCESS RIGHT  Comparison: None.  Approach:  Right common femoral artery  Vessels catheterized:  Superior mesenteric artery, inferior mesenteric artery, celiac axis  Technique and findings: The procedure, risks (including but not limited to bleeding, infection, organ damage), benefits, and alternatives were explained to the patient.  Questions regarding the procedure were encouraged and answered.  The patient understands and consents to the procedure.Right femoral region prepped and draped in usual sterile fashion. Maximal barrier sterile technique was utilized including caps, mask, sterile gowns, sterile gloves, sterile drape, hand hygiene and skin antiseptic.  Intravenous Fentanyl and Versed were administered as conscious sedation during continuous cardiorespiratory monitoring by the radiology RN, with a total moderate sedation time of 47 minutes.  Under real time ultrasound guidance, the right common femoral artery was accessed with 19 gauge  percutaneous entry needle using single-wall technique in a single pass.  Needle exchanged over [facility] wire for a 5-French vascular sheath, through which a 5- [place] C2 catheter was advanced in attempts to catheterize the superior mesenteric artery.  This was exchanged for a Sos catheter, and ultimately for a Simmons II catheter, for selective catheterization of the   superior mesenteric artery, inferior mesenteric artery, and celiac axis for selective arteriography of these distribution   in multiple projections.  There is no evidence of active extravasation.  No AVM or other arterial lesion is identified.  No significant atheromatous irregularity.  No aneurysm or stenosis.  The venous phase is unremarkable.  The catheter and sheath were removed and hemostasis achieved with the Exoseal device after confirmatory femoral arteriography. The patient tolerated the procedure well.  No immediate complication.  IMPRESSION:  No evidence of active extravasation or other focal arterial lesion.  The findings were reviewed with Dr. Grandville Silos post procedure.   Original Report Authenticated By: D. Wallace Going, MD   Ir Angiogram Visceral Selective  02/09/2013   *RADIOLOGY REPORT*  Clinical Data: Acute GI bleeding, source cannot be localized endoscopically.  The patient too large for tagged red blood cell scintigraphy.  SELECTIVE VISCERAL ARTERIOGRAPHY,IR ULTRASOUND GUIDANCE VASC ACCESS RIGHT  Comparison: None.  Approach:  Right common femoral artery  Vessels catheterized:  Superior mesenteric artery, inferior mesenteric artery, celiac axis  Technique and findings: The procedure, risks (including but not limited to bleeding, infection, organ damage), benefits, and alternatives were explained to the patient.  Questions regarding the procedure were encouraged and answered.  The patient understands and consents to the procedure.Right femoral region prepped and draped in usual sterile fashion. Maximal barrier sterile technique was  utilized including caps, mask, sterile gowns, sterile gloves, sterile drape, hand hygiene and skin antiseptic.  Intravenous Fentanyl and Versed were administered as conscious sedation during continuous cardiorespiratory monitoring by the radiology RN, with a total moderate sedation time of 47 minutes.  Under real time ultrasound guidance, the right common femoral artery was accessed with 19 gauge percutaneous entry needle using single-wall technique in a single pass.  Needle exchanged over [facility] wire for a 5-French vascular sheath, through which a 5- [place] C2 catheter was advanced in attempts to catheterize the superior mesenteric artery.  This was exchanged for a Sos catheter, and ultimately for a Simmons II catheter, for selective catheterization of the   superior mesenteric artery, inferior mesenteric artery, and celiac axis for selective arteriography of these distribution   in multiple projections.  There is no evidence of active extravasation.  No AVM or other arterial lesion is identified.  No significant atheromatous irregularity.  No aneurysm or stenosis.  The venous phase is unremarkable.  The catheter and sheath were removed and hemostasis achieved with the Exoseal device after confirmatory femoral arteriography. The patient tolerated the procedure well.  No immediate complication.  IMPRESSION:  No evidence of active extravasation or other focal arterial lesion.  The findings were reviewed with Dr. Grandville Silos post procedure.   Original Report Authenticated By: D. Wallace Going, MD   Ir Angiogram Visceral Selective  02/09/2013   *RADIOLOGY REPORT*  Clinical Data: Acute GI bleeding, source cannot be localized endoscopically.  The patient too large for tagged red blood cell scintigraphy.  SELECTIVE VISCERAL ARTERIOGRAPHY,IR ULTRASOUND GUIDANCE VASC ACCESS RIGHT  Comparison: None.  Approach:  Right common femoral artery  Vessels catheterized:  Superior mesenteric artery, inferior mesenteric artery, celiac  axis  Technique and findings: The procedure, risks (including but not limited to bleeding, infection, organ damage), benefits, and alternatives were explained to the patient.  Questions regarding the procedure were encouraged and answered.  The patient understands and consents to the procedure.Right femoral region prepped and draped in usual sterile fashion. Maximal barrier sterile technique was utilized including caps, mask, sterile gowns, sterile gloves, sterile drape, hand hygiene and skin antiseptic.  Intravenous Fentanyl and Versed were administered as conscious sedation during continuous cardiorespiratory monitoring by the radiology RN, with a total moderate sedation time of 47 minutes.  Under real time ultrasound guidance, the right common femoral artery  was accessed with 19 gauge percutaneous entry needle using single-wall technique in a single pass.  Needle exchanged over [facility] wire for a 5-French vascular sheath, through which a 5- [place] C2 catheter was advanced in attempts to catheterize the superior mesenteric artery.  This was exchanged for a Sos catheter, and ultimately for a Simmons II catheter, for selective catheterization of the   superior mesenteric artery, inferior mesenteric artery, and celiac axis for selective arteriography of these distribution   in multiple projections.  There is no evidence of active extravasation.  No AVM or other arterial lesion is identified.  No significant atheromatous irregularity.  No aneurysm or stenosis.  The venous phase is unremarkable.  The catheter and sheath were removed and hemostasis achieved with the Exoseal device after confirmatory femoral arteriography. The patient tolerated the procedure well.  No immediate complication.  IMPRESSION:  No evidence of active extravasation or other focal arterial lesion.  The findings were reviewed with Dr. Grandville Silos post procedure.   Original Report Authenticated By: D. Wallace Going, MD   Ir US Guide Vasc Access  Right  02/09/2013   *RADIOLOGY REPORT*  Clinical Data: Acute GI bleeding, source cannot be localized endoscopically.  The patient too large for tagged red blood cell scintigraphy.  SELECTIVE VISCERAL ARTERIOGRAPHY,IR ULTRASOUND GUIDANCE VASC ACCESS RIGHT  Comparison: None.  Approach:  Right common femoral artery  Vessels catheterized:  Superior mesenteric artery, inferior mesenteric artery, celiac axis  Technique and findings: The procedure, risks (including but not limited to bleeding, infection, organ damage), benefits, and alternatives were explained to the patient.  Questions regarding the procedure were encouraged and answered.  The patient understands and consents to the procedure.Right femoral region prepped and draped in usual sterile fashion. Maximal barrier sterile technique was utilized including caps, mask, sterile gowns, sterile gloves, sterile drape, hand hygiene and skin antiseptic.  Intravenous Fentanyl and Versed were administered as conscious sedation during continuous cardiorespiratory monitoring by the radiology RN, with a total moderate sedation time of 47 minutes.  Under real time ultrasound guidance, the right common femoral artery was accessed with 19 gauge percutaneous entry needle using single-wall technique in a single pass.  Needle exchanged over [facility] wire for a 5-French vascular sheath, through which a 5- [place] C2 catheter was advanced in attempts to catheterize the superior mesenteric artery.  This was exchanged for a Sos catheter, and ultimately for a Simmons II catheter, for selective catheterization of the   superior mesenteric artery, inferior mesenteric artery, and celiac axis for selective arteriography of these distribution   in multiple projections.  There is no evidence of active extravasation.  No AVM or other arterial lesion is identified.  No significant atheromatous irregularity.  No aneurysm or stenosis.  The venous phase is unremarkable.  The catheter and sheath were  removed and hemostasis achieved with the Exoseal device after confirmatory femoral arteriography. The patient tolerated the procedure well.  No immediate complication.  IMPRESSION:  No evidence of active extravasation or other focal arterial lesion.  The findings were reviewed with Dr. Grandville Silos post procedure.   Original Report Authenticated By: D. Wallace Going, MD    Scheduled Meds: . cholecalciferol  1,000 Units Oral Daily  . sodium chloride  3 mL Intravenous Q12H   Continuous Infusions: . sodium chloride 20 mL/hr at 02/06/13 2024  . sodium chloride    . sodium chloride        Time spent: 35 minutes    Costa Jha  [facility] Pager [phone].  If 7PM-7AM, please contact night-coverage at [email or web address], password [facility] 02/10/2013, 11:20 AM  LOS: 5 days

## 2013-02-10 NOTE — Progress Notes (Addendum)
CRITICAL VALUE ALERT  Critical value received:  Hemoglobin: 6.9  Date of notification:  02/10/2013  Time of notification:  0345  Critical value read back:yes  Nurse who received alert:  Eliane Decree, RN   MD notified (1st page):  M. Donnal Debar, NP via text page  Time of first page:  [phone]  MD notified (2nd page):  Time of second page:  Responding MD:  M. Donnal Debar, NP    Time MD responded:  [phone] via orders

## 2013-02-11 DIAGNOSIS — Z789 Other specified health status: Secondary | ICD-10-CM

## 2013-02-11 LAB — TYPE AND SCREEN
ABO/RH(D): O POS
Antibody Screen: NEGATIVE
Unit division: 0
Unit division: 0
Unit division: 0
Unit division: 0

## 2013-02-11 LAB — CBC
HCT: 23.8 % — ABNORMAL LOW (ref 39.0–52.0)
HCT: 24.5 % — ABNORMAL LOW (ref 39.0–52.0)
Hemoglobin: 8.1 g/dL — ABNORMAL LOW (ref 13.0–17.0)
Hemoglobin: 8.4 g/dL — ABNORMAL LOW (ref 13.0–17.0)
MCH: 29.8 pg (ref 26.0–34.0)
MCH: 30.1 pg (ref 26.0–34.0)
MCHC: 34 g/dL (ref 30.0–36.0)
MCHC: 34.3 g/dL (ref 30.0–36.0)
MCV: 87.5 fL (ref 78.0–100.0)
MCV: 87.8 fL (ref 78.0–100.0)
Platelets: 107 10*3/uL — ABNORMAL LOW (ref 150–400)
Platelets: 113 10*3/uL — ABNORMAL LOW (ref 150–400)
RBC: 2.72 MIL/uL — ABNORMAL LOW (ref 4.22–5.81)
RBC: 2.79 MIL/uL — ABNORMAL LOW (ref 4.22–5.81)
RDW: 16.8 % — ABNORMAL HIGH (ref 11.5–15.5)
RDW: 16.9 % — ABNORMAL HIGH (ref 11.5–15.5)
WBC: 7.1 10*3/uL (ref 4.0–10.5)
WBC: 6.4 10*3/uL (ref 4.0–10.5)

## 2013-02-11 NOTE — Progress Notes (Signed)
.  [facility] Progress Note   Subjective  No complaints, enjoyed regular diet, no stools   Objective  S/p major LGIB, now stable after an ablation of cecal avm by Dr Benson Norway, Hgb 8.1/23.8, was 8.3 yesterday. Vital signs in last 24 hours: Temp:  [97.2 F (36.2 C)-98.8 F (37.1 C)] 98.2 F (36.8 C) (05/25 0800) Pulse Rate:  [35-90] 58 (05/25 0800) Resp:  [11-27] 17 (05/25 0800) BP: (82-139)/(27-125) 128/87 mmHg (05/25 0800) SpO2:  [94 %-100 %] 98 % (05/25 0800) Weight:  [361 lb 8.9 oz (164 kg)] 361 lb 8.9 oz (164 kg) (05/25 0500) Last BM Date: 02/09/13 General:    white male in NAD,morbidly obese Heart:  Regular rate and rhythm; no murmurs Lungs: Respirations even and unlabored, lungs CTA bilaterally Abdomen:  Soft, nontender and nondistended. Normal bowel sounds., morbidly obese Extremities: edema. Neurologic:  Alert and oriented,  grossly normal neurologically. Psych:  Cooperative. Normal mood and affect.  Intake/Output from previous day: 05/24 0701 - 05/25 0700 In: 2212.5 [P.O.:1080; I.V.:820; Blood:312.5] Out: 1000 [Urine:1000] Intake/Output this shift: Total I/O In: 20 [I.V.:20] Out: 200 [Urine:200]  Lab Results:  Recent Labs  02/10/13 1438 02/10/13 2040 02/11/13 0236  WBC 7.1 6.6 7.1  HGB 8.2* 8.3* 8.1*  HCT 23.4* 23.7* 23.8*  PLT 95* 105* 107*   BMET No results found for this basename: NA, K, CL, CO2, GLUCOSE, BUN, CREATININE, CALCIUM,  in the last 72 hours LFT No results found for this basename: PROT, ALBUMIN, AST, ALT, ALKPHOS, BILITOT, BILIDIR, IBILI,  in the last 72 hours PT/INR No results found for this basename: LABPROT, INR,  in the last 72 hours  Studies/Results: No results found.     Assessment:  Cecal avm, 48 hours post endoscopic ablation, no further bleeding, will sign off ,will benefit from oral  Iron supplements     Plan:  Follow up with Dr Benson Norway as an outpatient  Principal Problem:   Acute lower gastrointestinal  bleeding Active Problems:   OBESITY, MORBID   AORTIC STENOSIS   VALVULAR HEART DISEASE   Acute blood loss anemia   Thrombocytopenia, unspecified   AVM (arteriovenous malformation) of colon with hemorrhage     LOS: 6 days   Delfin Edis  02/11/2013, 8:27 AM

## 2013-02-11 NOTE — Progress Notes (Signed)
[facility] PROGRESS NOTE  FREMONT LAROCHE [id number] DOB: [birth year] DOA: 02/05/2013 PCP: Woody Seller, MD  Brief narrative:  72 year old morbidly obese male with hx of valvular heart ds, AS, OSA and anemia admitted with BRBPR and anemia. Received 10 units PRBC since admission. Colonoscopy done on 5/21 showed large amount of blood in colon but no source of bleeding. EGD showed multiple superficial ulcers in proximal duodenum but again no source of bleeding. Patient continues to have BRBPR .   Assessment/Plan:  Acute lower GI bleed   Has received 16 units PRBC. Colonoscopy done initially without source of bleeding CT angiogram done on 5/22 doesnot show any area of bleeding. Capsule endoscopy on 5/23 negative per GI. Repeat colonoscopy done on 5/23 showed cecal AVMs which was ablated. No further bleeding and Hb improving. Hold ASA and strictly avoid NSAIDs  continue PPI. Add iron supplements on discharge. Advance diet  appreciate GI and surgery consult.   Thrombocytopenia  Patient continues to drop his platelets. Has not been on anticoagulation. HIT panel ordered. Given uncommon though pertinent side effects of thrombocytopenia with Lipitor it has been held. Resume neurontin.  Hypotension  . Holding BP meds . Currently stable   Aortic stenosis  TEE from 2011 cannot r/o AS. normal EF. No signs of CHF   Code Status: full code  Family Communication: none at bedside  Disposition Plan: transfer to tele.  Consultants:  Adriana Mccallum  CCS   Procedures:  EGD/ Colonoscopy on 5/21   Antibiotics:  none HPI/Subjective:  H&H stable. No further bleeding.   Objective: Filed Vitals:   02/11/13 1000 02/11/13 1100 02/11/13 1200 02/11/13 1300  BP:   116/56   Pulse: 68 71 72 65  Temp:   98.2 F (36.8 C)   TempSrc:   Oral   Resp: 18 14 17 18   Height:      Weight:      SpO2: 96% 97% 97% 98%    Intake/Output Summary (Last 24 hours) at 02/11/13 1405 Last data filed at  02/11/13 1300  Gross per 24 hour  Intake   1020 ml  Output    650 ml  Net    370 ml   Filed Weights   02/09/13 0600 02/10/13 0015 02/11/13 0500  Weight: 182.6 kg (402 lb 9 oz) 178 kg (392 lb 6.7 oz) 164 kg (361 lb 8.9 oz)    Exam:  General: Elderly obese male in NAD  HEENT: pallor+, Moist mucosa  Cardiovascular: Q000111Q, 3/6 systolic murmurs, rubs or gallop  Respiratory: clear b/l, no added sounds  Abdomen: soft, obese, NT, ND, BS+.  Musculoskeletal: warm, chronic venous stasis over b/l leg  CNS: AAOX3   Data Reviewed: Basic Metabolic Panel:  Recent Labs Lab 02/05/13 1546 02/07/13 0950 02/08/13 0355  NA 136 138 136  K 4.9 4.1 3.9  CL 105 108 107  CO2 18* 20 18*  GLUCOSE 120* 105* 113*  BUN 49* 36* 28*  CREATININE 1.54* 1.11 1.09  CALCIUM 7.3* 7.2* 7.0*   Liver Function Tests:  Recent Labs Lab 02/05/13 1546 02/07/13 0950  AST 15 19  ALT 17 16  ALKPHOS 50 43  BILITOT 0.2* 0.5  PROT 4.4* 3.8*  ALBUMIN 2.3* 2.2*   No results found for this basename: LIPASE, AMYLASE,  in the last 168 hours No results found for this basename: AMMONIA,  in the last 168 hours CBC:  Recent Labs Lab 02/05/13 1546  02/10/13 0330 02/10/13 1438 02/10/13 2040 02/11/13 0236 02/11/13  0833  WBC 13.3*  < > 6.7 7.1 6.6 7.1 6.4  NEUTROABS 11.6*  --   --   --   --   --   --   HGB 6.0*  < > 6.9* 8.2* 8.3* 8.1* 8.4*  HCT 18.4*  < > 19.6* 23.4* 23.7* 23.8* 24.5*  MCV 92.9  < > 86.7 86.0 86.2 87.5 87.8  PLT 142*  < > 92* 95* 105* 107* 113*  < > = values in this interval not displayed. Cardiac Enzymes: No results found for this basename: CKTOTAL, CKMB, CKMBINDEX, TROPONINI,  in the last 168 hours BNP (last 3 results) No results found for this basename: PROBNP,  in the last 8760 hours CBG:  Recent Labs Lab 02/10/13 0521  GLUCAP 93    Recent Results (from the past 240 hour(s))  MRSA PCR SCREENING     Status: None   Collection Time    02/05/13  9:53 PM      Result Value Range  Status   MRSA by PCR NEGATIVE  NEGATIVE Final   Comment:            The GeneXpert MRSA Assay (FDA     approved for NASAL specimens     only), is one component of a     comprehensive MRSA colonization     surveillance program. It is not     intended to diagnose MRSA     infection nor to guide or     monitor treatment for     MRSA infections.     Studies: No results found.  Scheduled Meds: . cholecalciferol  1,000 Units Oral Daily  . gabapentin  200 mg Oral BID  . sodium chloride  3 mL Intravenous Q12H   Continuous Infusions: . sodium chloride 20 mL/hr at 02/06/13 2024  . sodium chloride    . sodium chloride         Time spent: [place], [place]  [facility] Pager [phone] If 7PM-7AM, please contact night-coverage at [email or web address], password [facility] 02/11/2013, 2:05 PM  LOS: 6 days

## 2013-02-11 NOTE — Progress Notes (Signed)
Patient ID: [name], male   DOB: January 10, 1941, 72 y.o.   MRN: [id number] 2 Days Post-Op  Subjective: Pt feels well.  No further bleeding.  Tolerating a regular diet  Objective: Vital signs in last 24 hours: Temp:  [97.2 F (36.2 C)-98.8 F (37.1 C)] 98.2 F (36.8 C) (05/25 0800) Pulse Rate:  [35-90] 58 (05/25 0800) Resp:  [11-27] 17 (05/25 0800) BP: (82-139)/(27-125) 128/87 mmHg (05/25 0800) SpO2:  [94 %-100 %] 98 % (05/25 0800) Weight:  [361 lb 8.9 oz (164 kg)] 361 lb 8.9 oz (164 kg) (05/25 0500) Last BM Date: 02/09/13  Intake/Output from previous day: 05/24 0701 - 05/25 0700 In: 2212.5 [P.O.:1080; I.V.:820; Blood:312.5] Out: 1000 [Urine:1000] Intake/Output this shift: Total I/O In: 20 [I.V.:20] Out: 200 [Urine:200]  PE: Abd: soft, NT, ND, +BS, obese  Lab Results:   Recent Labs  02/10/13 2040 02/11/13 0236  WBC 6.6 7.1  HGB 8.3* 8.1*  HCT 23.7* 23.8*  PLT 105* 107*   BMET No results found for this basename: NA, K, CL, CO2, GLUCOSE, BUN, CREATININE, CALCIUM,  in the last 72 hours PT/INR No results found for this basename: LABPROT, INR,  in the last 72 hours CMP     Component Value Date/Time   NA 136 02/08/2013 0355   K 3.9 02/08/2013 0355   CL 107 02/08/2013 0355   CO2 18* 02/08/2013 0355   GLUCOSE 113* 02/08/2013 0355   BUN 28* 02/08/2013 0355   CREATININE 1.09 02/08/2013 0355   CALCIUM 7.0* 02/08/2013 0355   PROT 3.8* 02/07/2013 0950   ALBUMIN 2.2* 02/07/2013 0950   AST 19 02/07/2013 0950   ALT 16 02/07/2013 0950   ALKPHOS 43 02/07/2013 0950   BILITOT 0.5 02/07/2013 0950   GFRNONAA 67* 02/08/2013 0355   GFRAA 77* 02/08/2013 0355   Lipase  No results found for this basename: lipase       Studies/Results: No results found.  Anti-infectives: Anti-infectives   None       Assessment/Plan  1. LGI bleed secondary to cecal AVM, s/p c-scope ablation  Plan: 1. hgb is stable and patient has had no further bleeding.  Agree with regular diet.  We will  sign off as there are no further surgery indications at this time.   LOS: 6 days    Ethanael Veith E 02/11/2013, 8:33 AM Pager: [phone]

## 2013-02-11 NOTE — Progress Notes (Signed)
[name] [id number]  [name] is a 72 y.o. male patient admitted from 2600 awake, alert  & orientated  X 4, no distress noted.  VSS -   no c/o shortness of breath, no c/o chest pain. Cardiac tele [phone] in place, cardiac monitor yields 1st depreee HB Allergies:  No Known Allergies Pt orientation to unit, room and routine. Information packet given to patient/family.   INP armband ID verified with patient/family, and in place. SR up x 2, fall risk assessment complete with Patient and family verbalizing understanding of risks associated with falls. Pt verbalizes an understanding of how to use the call bell and to call for help before getting out of bed.  Skin, clean-dry- With Open blister to bilateral shin covered by Foam/Allevyn dressing, Right abdominal fold skin tear, and bottom mildly red and blanchable. Patient uses a walker normal and is scheduled to work with PT/OT. Will cont to eval and treat per MD orders.  Rainey Pines, RN 02/11/2013 8:05 PM

## 2013-02-11 NOTE — Progress Notes (Signed)
Agree with PA - Osborne's note  Call back for any concerns

## 2013-02-11 NOTE — Progress Notes (Signed)
Pt on home cpap when rt came to assist.

## 2013-02-12 DIAGNOSIS — D649 Anemia, unspecified: Secondary | ICD-10-CM

## 2013-02-12 LAB — CBC
HCT: 23.5 % — ABNORMAL LOW (ref 39.0–52.0)
Hemoglobin: 8.1 g/dL — ABNORMAL LOW (ref 13.0–17.0)
MCH: 30.5 pg (ref 26.0–34.0)
MCHC: 34.5 g/dL (ref 30.0–36.0)
MCV: 88.3 fL (ref 78.0–100.0)
Platelets: 123 10*3/uL — ABNORMAL LOW (ref 150–400)
RBC: 2.66 MIL/uL — ABNORMAL LOW (ref 4.22–5.81)
RDW: 17.3 % — ABNORMAL HIGH (ref 11.5–15.5)
WBC: 6.6 10*3/uL (ref 4.0–10.5)

## 2013-02-12 MED ORDER — OXYCODONE-ACETAMINOPHEN 10-325 MG PO TABS: 1.0000 | ORAL_TABLET | Freq: Four times a day (QID) | ORAL | Status: DC | PRN

## 2013-02-12 MED ORDER — FERROUS SULFATE 325 (65 FE) MG PO TABS: 325.0000 mg | ORAL_TABLET | Freq: Two times a day (BID) | ORAL | Status: DC

## 2013-02-12 NOTE — Progress Notes (Signed)
Patient very unsteady on working with PT . Recommends inpatient rehab. CIR consulted. Discharged held.

## 2013-02-12 NOTE — Discharge Summary (Addendum)
Physician Discharge Summary  [name] [id number] DOB: February 23, 1941 DOA: 02/05/2013  PCP: Penni Homans, MD  Admit date: 02/05/2013 Discharge date: 02/12/2013  Time spent: 40 minutes  Recommendations for Outpatient Follow-up:  Home with home health PT Follow up with PCP in 1 week. Needs H&H checked F/up with Dr Benson Norway in 2 weeks.   Discharge Diagnoses:   Principal Problem:   Acute lower gastrointestinal bleeding  Active Problems:   Acute blood loss anemia, severe   AVM (arteriovenous malformation) of colon with hemorrhage   Thrombocytopenia, unspecified   Morbid obesity   OSA   Arthritis of knee   Discharge Condition: fair  Diet recommendation: regular  Filed Weights   02/10/13 0015 02/11/13 0500 02/11/13 1806  Weight: 178 kg (392 lb 6.7 oz) 164 kg (361 lb 8.9 oz) 164 kg (361 lb 8.9 oz)    History of present illness:  Please refer to admission H&P for details, but in brief 72 year old morbidly obese male with hx of valvular heart ds, AS, OSA and anemia admitted with BRBPR and anemia. Received 10 units PRBC since admission. Colonoscopy done on 5/21 showed large amount of blood in colon but no source of bleeding. EGD showed multiple superficial ulcers in proximal duodenum but again no source of bleeding. Patient continued to have BRBPR and required stepdown monitoring.   Hospital Course:   Acute lower GI bleed  Patient  received 16 units PRBC for ongoing LGIB .  Colonoscopy done initially without source of bleeding  CT angiogram done on 5/22 negative any area of bleeding. Capsule endoscopy on 5/23 negative per GI. Repeat colonoscopy done on 5/23 showed cecal AVMs which was ablated. No further bleeding and Hb improving.  Hold ASA and strictly avoid NSAIDs . Patient was taking dicofenac at home for knee pain. Added iron supplements on discharge.  appreciate GI and surgery consult.   Thrombocytopenia  Patient had drop in his platelets. Has not been on  anticoagulation. HIT panel sent. Given uncommon though pertinent side effects of thrombocytopenia with Lipitor and neurontin was held. platelets now improving. Will resume these meds and follow up as outpatient.   Hypotension  BP stable. Resume home meds on discharge  Aortic stenosis  TEE from 2011 cannot r/o AS. normal EF. No signs of CHF   OSA  continue home CPAP  Patient clinically stable for discharge home. Has not had further Lower GI bleed. Hb stable. instructed to monitor for further bleed and symptoms of acute anemia and contact his PCP or return to ED if bleeding recurs.  Seen by PT and recommends HHPT for independent gait transfer and safe ambulation.   Code Status: full code  Family Communication: sons and daughter at bedside  Consultants:  Benson Norway / Collene Mares  CCS   Procedures:  EGD/ Colonoscopy on 5/21 and 5/23 Capsule endoscopy on 5/23      Discharge Exam: Filed Vitals:   02/12/13 0104 02/12/13 0446 02/12/13 0925 02/12/13 0935  BP: 106/64 122/64 108/61 128/67  Pulse: 58 69    Temp:  98.7 F (37.1 C)    TempSrc:  Oral    Resp:  22    Height:      Weight:      SpO2:  97%     General: Elderly obese male in NAD  HEENT: pallor+, Moist mucosa  Cardiovascular: [id number], 3/6 systolic murmurs, rubs or gallop  Respiratory: clear b/l, no added sounds  Abdomen: soft, obese, NT, ND, BS+.  Musculoskeletal: warm, chronic venous stasis over  b/l leg  CNS: AAOX3    Discharge Instructions   Future Appointments Provider Department Dept Phone   02/20/2013 9:00 AM Gwenyth Ober, MD [facility], [place]   02/26/2013 10:30 AM Guido Sander, [place] [phone]   09/18/2013 1:30 PM Deneise Lever, MD [facility] [phone]       Medication List    STOP taking these medications       aspirin 81 MG tablet     aspirin EC 81 MG tablet     diclofenac 75 MG EC tablet  Commonly known as:   VOLTAREN     MULTIVITAMIN PO      TAKE these medications       atorvastatin 40 MG tablet  Commonly known as:  LIPITOR  Take 40 mg by mouth daily.     cholecalciferol 1000 UNITS tablet  Commonly known as:  VITAMIN D  Take 1,000 Units by mouth daily.     ferrous sulfate 325 (65 FE) MG tablet  Take 1 tablet (325 mg total) by mouth 2 (two) times daily.     gabapentin 100 MG capsule  Commonly known as:  NEURONTIN  Take 200 mg by mouth 2 (two) times daily.     lisinopril 10 MG tablet  Commonly known as:  PRINIVIL,ZESTRIL  Take 10 mg by mouth 2 (two) times daily.     multivitamin with minerals Tabs  Take 1 tablet by mouth daily.     oxyCODONE-acetaminophen 10-325 MG per tablet  Commonly known as:  PERCOCET  Take 1 tablet by mouth every 6 (six) hours as needed for pain.     sennosides-docusate sodium 8.6-50 MG tablet  Commonly known as:  SENOKOT-S  Take 1 tablet by mouth 2 (two) times daily.       No Known Allergies     Follow-up Information   Follow up with Penni Homans, MD. Schedule an appointment as soon as possible for a visit in 1 week. (needs H&H checked)    Contact information:   [address] [place] [address] [phone]       Follow up with HUNG,PATRICK D, MD. Schedule an appointment as soon as possible for a visit in 2 weeks.   Contact information:   [place], [place]  [address] [phone]        The results of significant diagnostics from this hospitalization (including imaging, microbiology, ancillary and laboratory) are listed below for reference.    Significant Diagnostic Studies: Ir Angiogram Visceral Selective  02/09/2013   *RADIOLOGY REPORT*  Clinical Data: Acute GI bleeding, source cannot be localized endoscopically.  The patient too large for tagged red blood cell scintigraphy.  SELECTIVE VISCERAL ARTERIOGRAPHY,IR ULTRASOUND GUIDANCE VASC ACCESS RIGHT  Comparison: None.  Approach:  Right common femoral artery   Vessels catheterized:  Superior mesenteric artery, inferior mesenteric artery, celiac axis  Technique and findings: The procedure, risks (including but not limited to bleeding, infection, organ damage), benefits, and alternatives were explained to the patient.  Questions regarding the procedure were encouraged and answered.  The patient understands and consents to the procedure.Right femoral region prepped and draped in usual sterile fashion. Maximal barrier sterile technique was utilized including caps, mask, sterile gowns, sterile gloves, sterile drape, hand hygiene and skin antiseptic.  Intravenous Fentanyl and Versed were administered as conscious sedation during continuous cardiorespiratory monitoring by the radiology RN, with a total moderate sedation time of 47 minutes.  Under real time ultrasound  guidance, the right common femoral artery was accessed with 19 gauge percutaneous entry needle using single-wall technique in a single pass.  Needle exchanged over [facility] wire for a 5-French vascular sheath, through which a 5- [place] C2 catheter was advanced in attempts to catheterize the superior mesenteric artery.  This was exchanged for a Sos catheter, and ultimately for a Simmons II catheter, for selective catheterization of the   superior mesenteric artery, inferior mesenteric artery, and celiac axis for selective arteriography of these distribution   in multiple projections.  There is no evidence of active extravasation.  No AVM or other arterial lesion is identified.  No significant atheromatous irregularity.  No aneurysm or stenosis.  The venous phase is unremarkable.  The catheter and sheath were removed and hemostasis achieved with the Exoseal device after confirmatory femoral arteriography. The patient tolerated the procedure well.  No immediate complication.  IMPRESSION:  No evidence of active extravasation or other focal arterial lesion.  The findings were reviewed with Dr. Grandville Silos post procedure.    Original Report Authenticated By: D. Wallace Going, MD   Ir Angiogram Visceral Selective  02/09/2013   *RADIOLOGY REPORT*  Clinical Data: Acute GI bleeding, source cannot be localized endoscopically.  The patient too large for tagged red blood cell scintigraphy.  SELECTIVE VISCERAL ARTERIOGRAPHY,IR ULTRASOUND GUIDANCE VASC ACCESS RIGHT  Comparison: None.  Approach:  Right common femoral artery  Vessels catheterized:  Superior mesenteric artery, inferior mesenteric artery, celiac axis  Technique and findings: The procedure, risks (including but not limited to bleeding, infection, organ damage), benefits, and alternatives were explained to the patient.  Questions regarding the procedure were encouraged and answered.  The patient understands and consents to the procedure.Right femoral region prepped and draped in usual sterile fashion. Maximal barrier sterile technique was utilized including caps, mask, sterile gowns, sterile gloves, sterile drape, hand hygiene and skin antiseptic.  Intravenous Fentanyl and Versed were administered as conscious sedation during continuous cardiorespiratory monitoring by the radiology RN, with a total moderate sedation time of 47 minutes.  Under real time ultrasound guidance, the right common femoral artery was accessed with 19 gauge percutaneous entry needle using single-wall technique in a single pass.  Needle exchanged over [facility] wire for a 5-French vascular sheath, through which a 5- [place] C2 catheter was advanced in attempts to catheterize the superior mesenteric artery.  This was exchanged for a Sos catheter, and ultimately for a Simmons II catheter, for selective catheterization of the   superior mesenteric artery, inferior mesenteric artery, and celiac axis for selective arteriography of these distribution   in multiple projections.  There is no evidence of active extravasation.  No AVM or other arterial lesion is identified.  No significant atheromatous irregularity.  No  aneurysm or stenosis.  The venous phase is unremarkable.  The catheter and sheath were removed and hemostasis achieved with the Exoseal device after confirmatory femoral arteriography. The patient tolerated the procedure well.  No immediate complication.  IMPRESSION:  No evidence of active extravasation or other focal arterial lesion.  The findings were reviewed with Dr. Grandville Silos post procedure.   Original Report Authenticated By: D. Wallace Going, MD   Ir Angiogram Visceral Selective  02/09/2013   *RADIOLOGY REPORT*  Clinical Data: Acute GI bleeding, source cannot be localized endoscopically.  The patient too large for tagged red blood cell scintigraphy.  SELECTIVE VISCERAL ARTERIOGRAPHY,IR ULTRASOUND GUIDANCE VASC ACCESS RIGHT  Comparison: None.  Approach:  Right common femoral artery  Vessels catheterized:  Superior mesenteric artery,  inferior mesenteric artery, celiac axis  Technique and findings: The procedure, risks (including but not limited to bleeding, infection, organ damage), benefits, and alternatives were explained to the patient.  Questions regarding the procedure were encouraged and answered.  The patient understands and consents to the procedure.Right femoral region prepped and draped in usual sterile fashion. Maximal barrier sterile technique was utilized including caps, mask, sterile gowns, sterile gloves, sterile drape, hand hygiene and skin antiseptic.  Intravenous Fentanyl and Versed were administered as conscious sedation during continuous cardiorespiratory monitoring by the radiology RN, with a total moderate sedation time of 47 minutes.  Under real time ultrasound guidance, the right common femoral artery was accessed with 19 gauge percutaneous entry needle using single-wall technique in a single pass.  Needle exchanged over [facility] wire for a 5-French vascular sheath, through which a 5- [place] C2 catheter was advanced in attempts to catheterize the superior mesenteric artery.  This was  exchanged for a Sos catheter, and ultimately for a Simmons II catheter, for selective catheterization of the   superior mesenteric artery, inferior mesenteric artery, and celiac axis for selective arteriography of these distribution   in multiple projections.  There is no evidence of active extravasation.  No AVM or other arterial lesion is identified.  No significant atheromatous irregularity.  No aneurysm or stenosis.  The venous phase is unremarkable.  The catheter and sheath were removed and hemostasis achieved with the Exoseal device after confirmatory femoral arteriography. The patient tolerated the procedure well.  No immediate complication.  IMPRESSION:  No evidence of active extravasation or other focal arterial lesion.  The findings were reviewed with Dr. Grandville Silos post procedure.   Original Report Authenticated By: D. Wallace Going, MD   Ir US Guide Vasc Access Right  02/09/2013   *RADIOLOGY REPORT*  Clinical Data: Acute GI bleeding, source cannot be localized endoscopically.  The patient too large for tagged red blood cell scintigraphy.  SELECTIVE VISCERAL ARTERIOGRAPHY,IR ULTRASOUND GUIDANCE VASC ACCESS RIGHT  Comparison: None.  Approach:  Right common femoral artery  Vessels catheterized:  Superior mesenteric artery, inferior mesenteric artery, celiac axis  Technique and findings: The procedure, risks (including but not limited to bleeding, infection, organ damage), benefits, and alternatives were explained to the patient.  Questions regarding the procedure were encouraged and answered.  The patient understands and consents to the procedure.Right femoral region prepped and draped in usual sterile fashion. Maximal barrier sterile technique was utilized including caps, mask, sterile gowns, sterile gloves, sterile drape, hand hygiene and skin antiseptic.  Intravenous Fentanyl and Versed were administered as conscious sedation during continuous cardiorespiratory monitoring by the radiology RN, with a total  moderate sedation time of 47 minutes.  Under real time ultrasound guidance, the right common femoral artery was accessed with 19 gauge percutaneous entry needle using single-wall technique in a single pass.  Needle exchanged over [facility] wire for a 5-French vascular sheath, through which a 5- [place] C2 catheter was advanced in attempts to catheterize the superior mesenteric artery.  This was exchanged for a Sos catheter, and ultimately for a Simmons II catheter, for selective catheterization of the   superior mesenteric artery, inferior mesenteric artery, and celiac axis for selective arteriography of these distribution   in multiple projections.  There is no evidence of active extravasation.  No AVM or other arterial lesion is identified.  No significant atheromatous irregularity.  No aneurysm or stenosis.  The venous phase is unremarkable.  The catheter and sheath were removed and hemostasis achieved with  the Exoseal device after confirmatory femoral arteriography. The patient tolerated the procedure well.  No immediate complication.  IMPRESSION:  No evidence of active extravasation or other focal arterial lesion.  The findings were reviewed with Dr. Grandville Silos post procedure.   Original Report Authenticated By: D. Wallace Going, MD    Microbiology: Recent Results (from the past 240 hour(s))  MRSA PCR SCREENING     Status: None   Collection Time    02/05/13  9:53 PM      Result Value Range Status   MRSA by PCR NEGATIVE  NEGATIVE Final   Comment:            The GeneXpert MRSA Assay (FDA     approved for NASAL specimens     only), is one component of a     comprehensive MRSA colonization     surveillance program. It is not     intended to diagnose MRSA     infection nor to guide or     monitor treatment for     MRSA infections.     Labs: Basic Metabolic Panel:  Recent Labs Lab 02/05/13 1546 02/07/13 0950 02/08/13 0355  NA 136 138 136  K 4.9 4.1 3.9  CL 105 108 107  CO2 18* 20 18*   GLUCOSE 120* 105* 113*  BUN 49* 36* 28*  CREATININE 1.54* 1.11 1.09  CALCIUM 7.3* 7.2* 7.0*   Liver Function Tests:  Recent Labs Lab 02/05/13 1546 02/07/13 0950  AST 15 19  ALT 17 16  ALKPHOS 50 43  BILITOT 0.2* 0.5  PROT 4.4* 3.8*  ALBUMIN 2.3* 2.2*   No results found for this basename: LIPASE, AMYLASE,  in the last 168 hours No results found for this basename: AMMONIA,  in the last 168 hours CBC:  Recent Labs Lab 02/05/13 1546  02/10/13 1438 02/10/13 2040 02/11/13 0236 02/11/13 0833 02/12/13 0540  WBC 13.3*  < > 7.1 6.6 7.1 6.4 6.6  NEUTROABS 11.6*  --   --   --   --   --   --   HGB 6.0*  < > 8.2* 8.3* 8.1* 8.4* 8.1*  HCT 18.4*  < > 23.4* 23.7* 23.8* 24.5* 23.5*  MCV 92.9  < > 86.0 86.2 87.5 87.8 88.3  PLT 142*  < > 95* 105* 107* 113* 123*  < > = values in this interval not displayed. Cardiac Enzymes: No results found for this basename: CKTOTAL, CKMB, CKMBINDEX, TROPONINI,  in the last 168 hours BNP: BNP (last 3 results) No results found for this basename: PROBNP,  in the last 8760 hours CBG:  Recent Labs Lab 02/10/13 0521  GLUCAP 93       Signed:  Shaelin Lalley  [facility] 02/12/2013, 11:38 AM

## 2013-02-12 NOTE — Op Note (Addendum)
[facility] [place], [id number]   OPERATIVE PROCEDURE REPORT  PATIENT: [name], [name]  MR#: [id number] BIRTHDATE: 1940-11-04  GENDER: Male ENDOSCOPIST: Carol Ada, MD ASSISTANT:   Hilma Favors, RN Elna Breslow, RN Cristopher Estimable, technician PROCEDURE DATE: 02/09/2013 PROCEDURE:   Colonoscopy with control of bleeding ASA CLASS:   Class III INDICATIONS:Persistent Hematochezia MEDICATIONS: Versed 7 mg IV and Fentanyl 50 mcg IV  DESCRIPTION OF PROCEDURE:   After the risks benefits and alternatives of the procedure were thoroughly explained, informed consent was obtained.  A digital rectal exam revealed no abnormalities of the rectum.    The [employer]ax Adult Colon [id number] endoscope was introduced through the anus  and advanced to the cecum, which was identified by both the appendix and ileocecal valve , No adverse events experienced.    The quality of the prep was poor. .  The instrument was then slowly withdrawn as the colon was fully examined.    FINDINGS: The procedure was very difficult to perform as the colon was filled with blood and clots.  I was able to advance to colonoscope to the cecum, albeit slowly with aggressive washing. In the cecum a glimpse of a fresh clot was identified.  Also, the two capsule endoscopes, one functioning and the other nonfunctioning were in the cecum.  Aggressive washing revealed a vigorougly bleeding AVM.  Four hemoclips were deployed, which eventually arrested the bleeding.  Eventhough there was no further bleeding I opted to inject a total of 6 ml of 1:10,000 Epi.  In the distal colon a diverticulum was found.   Retroflexion was not performed.     The scope was then withdrawn from the patient and the procedure terminated.  COMPLICATIONS: There were no complications.  IMPRESSION: 1) Actively bleeding cecal AVM s/p hemoclipping. 2) Diverticulum.  RECOMMENDATIONS: 1) Follow HGB and transfuse as  necessary. 2) If bleeding persists IR needs to be reconsulted to embolize the cecal region.   _______________________________ Activated:  02/09/2013 5:46 PM

## 2013-02-12 NOTE — Progress Notes (Signed)
PT HR dropped to 39 and pt had one beat of type 2 heartblock. RN paged MD on call and is awaiting further orders. Pt is asymptomatic. RN will continue to monitor pt.

## 2013-02-12 NOTE — Progress Notes (Signed)
Physical Therapy Treatment Patient Details Name: [name] MRN: [id number] DOB: 03-18-1941 Today's Date: 02/12/2013 Time: [id number] PT Time Calculation (min): 26 min  PT Assessment / Plan / Recommendation Comments on Treatment Session  Pt adm with GI bleed.  Pt was evaluated earlier this AM.  Was unaware that pt was possibly being dc'd.  Pt now has his shoes and w/c here but was still unable to get to chair or to amb forwards.  Recommend CIR consult for rehab prior to dc home.    Follow Up Recommendations  CIR     Does the patient have the potential to tolerate intense rehabilitation     Barriers to Discharge        Equipment Recommendations  None recommended by PT    Recommendations for Other Services Rehab consult  Frequency Min 3X/week   Plan Discharge plan needs to be updated;Frequency remains appropriate    Precautions / Restrictions Precautions Precautions: Fall   Pertinent Vitals/Pain N/A    Mobility  Bed Mobility Bed Mobility: Supine to Sit;Sitting - Scoot to [employer] of Bed;Sit to Supine;Scooting to [facility] Supine to Sit: 1: +2 Total assist Supine to Sit: Patient Percentage: 70% Sitting - Scoot to Edge of Bed: 4: Min assist Sit to Supine: 1: +2 Total assist Sit to Supine: Patient Percentage: 60% Scooting to HOB: 1: +2 Total assist Scooting to [facility]: Patient Percentage: 20% (bed in trendelenburg.) Details for Bed Mobility Assistance: Assist to bring trunk up. Transfers Transfers: Sit to Stand;Stand to Sit Sit to Stand: 1: +2 Total assist;From elevated surface;With upper extremity assist;From bed Sit to Stand: Patient Percentage: 80% Stand to Sit: 1: +2 Total assist;With upper extremity assist;To elevated surface;To bed Stand to Sit: Patient Percentage: 90% Details for Transfer Assistance: Raised bed up prior to standing. Ambulation/Gait Ambulation/Gait Assistance: Other (comment) (Unable to take step toward w/c.) Ambulation/Gait: Patient Percentage:  80% Ambulation Distance (Feet):  (2 feet side-stepping at bedside) Assistive device: Rolling walker Ambulation/Gait Assistance Details: Side-stepping only at bedside. Gait Pattern: Trunk flexed    Exercises     PT Diagnosis: Difficulty walking;Generalized weakness  PT Problem List: Decreased strength;Decreased mobility;Obesity PT Treatment Interventions: DME instruction;Gait training;Patient/family education;Functional mobility training;Therapeutic activities;Therapeutic exercise   PT Goals Acute Rehab PT Goals PT Goal Formulation: With patient Time For Goal Achievement: 02/19/13 Potential to Achieve Goals: Good Pt will go Supine/Side to Sit: with min assist PT Goal: Supine/Side to Sit - Progress: Goal set today Pt will go Sit to Supine/Side: with min assist PT Goal: Sit to Supine/Side - Progress: Goal set today Pt will go Sit to Stand: with supervision PT Goal: Sit to Stand - Progress: Goal set today Pt will go Stand to Sit: with supervision PT Goal: Stand to Sit - Progress: Goal set today Pt will Transfer Bed to Chair/Chair to Bed: with supervision PT Transfer Goal: Bed to Chair/Chair to Bed - Progress: Goal set today Pt will Ambulate: 16 - 50 feet;with supervision;with rolling walker PT Goal: Ambulate - Progress: Goal set today  Visit Information  Last PT Received On: 02/12/13 Assistance Needed: +2    Subjective Data  Subjective: "I can't." pt stated after attempting to step to w/c. Patient Stated Goal: Return home.   Cognition  Cognition Arousal/Alertness: Awake/alert Behavior During Therapy: WFL for tasks assessed/performed Overall Cognitive Status: Within Functional Limits for tasks assessed    Balance  Balance Balance Assessed: Yes Static Standing Balance Static Standing - Balance Support: Bilateral upper extremity supported Static Standing - Level of  Assistance: 4: Min Publishing rights manager Standing - Comment/# of Minutes: Stood x 3.    End of Session PT - End of  Session Equipment Utilized During Treatment:  (pt too large for gait belt.) Activity Tolerance: Patient tolerated treatment well Patient left: in bed;with call bell/phone within reach;with family/visitor present Nurse Communication: Mobility status   GP     [facility] 02/12/2013, 12:56 PM  [facility] PT [phone]

## 2013-02-12 NOTE — Progress Notes (Signed)
Rehab Admissions Coordinator Note:  Patient was screened by Retta Diones for appropriateness for an Inpatient Acute Rehab Consult. Noted PT recommending CIR.  At this time, we are recommending Inpatient Rehab consult.  Retta Diones 02/12/2013, 1:39 PM  I can be reached at [phone].

## 2013-02-12 NOTE — Progress Notes (Signed)
Pt.has home CPAP set up in room and states he is able to place it on himself.

## 2013-02-12 NOTE — Evaluation (Signed)
Physical Therapy Evaluation Patient Details Name: [name] MRN: [id number] DOB: [birth year] Today's Date: 02/12/2013 Time: 0920-1003 PT Time Calculation (min): 43 min  PT Assessment / Plan / Recommendation Clinical Impression    Pt admitted with GI bleed. Pt currently with functional limitations due to the deficits listed below (PT Problem List). Pt will benefit from skilled PT to increase their independence and safety with mobility to allow discharge home with wife.      PT Assessment  Patient needs continued PT services    Follow Up Recommendations  Home health PT    Does the patient have the potential to tolerate intense rehabilitation      Barriers to Discharge        Equipment Recommendations  None recommended by PT    Recommendations for Other Services     Frequency Min 3X/week    Precautions / Restrictions Precautions Precautions: Fall Restrictions Weight Bearing Restrictions: No   Pertinent Vitals/Pain See flow sheet.      Mobility  Bed Mobility Bed Mobility: Supine to Sit;Sitting - Scoot to [employer] of Bed;Sit to Supine;Scooting to [facility] Supine to Sit: 1: +2 Total assist Supine to Sit: Patient Percentage: 70% Sitting - Scoot to Edge of Bed: 4: Min assist Sit to Supine: 1: +2 Total assist Sit to Supine: Patient Percentage: 60% Scooting to HOB: 1: +2 Total assist Scooting to [facility]: Patient Percentage: 20% (bed in trendelenburg.) Details for Bed Mobility Assistance: Assist to bring trunk up. Transfers Transfers: Sit to Stand;Stand to Sit Sit to Stand: 1: +2 Total assist;From elevated surface;With upper extremity assist;From bed Sit to Stand: Patient Percentage: 80% Stand to Sit: 1: +2 Total assist;With upper extremity assist;To elevated surface;To bed Stand to Sit: Patient Percentage: 90% Details for Transfer Assistance: Raised bed up prior to standing. Ambulation/Gait Ambulation/Gait Assistance: 1: +2 Total assist Ambulation/Gait: Patient Percentage:  80% Ambulation Distance (Feet):  (2 feet side-stepping at bedside) Assistive device: Rolling walker Ambulation/Gait Assistance Details: Side-stepping only at bedside. Gait Pattern: Trunk flexed    Exercises     PT Diagnosis: Difficulty walking;Generalized weakness  PT Problem List: Decreased strength;Decreased mobility;Obesity PT Treatment Interventions: DME instruction;Gait training;Patient/family education;Functional mobility training;Therapeutic activities;Therapeutic exercise   PT Goals Acute Rehab PT Goals PT Goal Formulation: With patient Time For Goal Achievement: 02/19/13 Potential to Achieve Goals: Good Pt will go Supine/Side to Sit: with min assist PT Goal: Supine/Side to Sit - Progress: Goal set today Pt will go Sit to Supine/Side: with min assist PT Goal: Sit to Supine/Side - Progress: Goal set today Pt will go Sit to Stand: with supervision PT Goal: Sit to Stand - Progress: Goal set today Pt will go Stand to Sit: with supervision PT Goal: Stand to Sit - Progress: Goal set today Pt will Transfer Bed to Chair/Chair to Bed: with supervision PT Transfer Goal: Bed to Chair/Chair to Bed - Progress: Goal set today Pt will Ambulate: 16 - 50 feet;with supervision;with rolling walker PT Goal: Ambulate - Progress: Goal set today  Visit Information  Last PT Received On: 02/12/13 Assistance Needed: +2    Subjective Data  Subjective: "My knees feel weak," pt stated when standing. Patient Stated Goal: Return home.   Prior Functioning  Home Living Lives With: Spouse Available Help at Discharge: Family Type of Home: House Home Access: Stairs to enter Technical brewer of Steps: 2 Home Layout: One level Bathroom Toilet: Handicapped height [place]: Raised toilet seat with rails;Youth worker - rolling;Reacher Additional Comments: Everything that pt sits  on at home is built up to higher height. Prior Function Level of Independence: Independent  with assistive device(s) Driving: Yes Vocation: Retired Comments: Amb household distances with rolling walker.  Uses transport chair or scooter when in community. Communication Communication: No difficulties    Cognition  Cognition Arousal/Alertness: Awake/alert Behavior During Therapy: WFL for tasks assessed/performed Overall Cognitive Status: Within Functional Limits for tasks assessed    Extremity/Trunk Assessment Right Lower Extremity Assessment RLE ROM/Strength/Tone: Deficits RLE ROM/Strength/Tone Deficits: grossly 3+/5 except ankle 1/5 Left Lower Extremity Assessment LLE ROM/Strength/Tone: Deficits LLE ROM/Strength/Tone Deficits: grossly 3+/5   Balance Balance Balance Assessed: Yes Static Standing Balance Static Standing - Balance Support: Bilateral upper extremity supported Static Standing - Level of Assistance: 4: Min assist Static Standing - Comment/# of Minutes: Stood 3 times for 3-5 minutes each time.  End of Session PT - End of Session Equipment Utilized During Treatment:  (pt too large for gait belt.) Activity Tolerance: Patient tolerated treatment well Patient left: in bed;with call bell/phone within reach Nurse Communication: Mobility status  GP     [facility] 02/12/2013, 10:40 AM  [facility] PT [phone]

## 2013-02-12 NOTE — Progress Notes (Signed)
Per MD RN is to continue to monitor pt

## 2013-02-13 DIAGNOSIS — D696 Thrombocytopenia, unspecified: Secondary | ICD-10-CM

## 2013-02-13 DIAGNOSIS — K922 Gastrointestinal hemorrhage, unspecified: Secondary | ICD-10-CM

## 2013-02-13 DIAGNOSIS — Q2733 Arteriovenous malformation of digestive system vessel: Secondary | ICD-10-CM

## 2013-02-13 LAB — CBC
HCT: 25.5 % — ABNORMAL LOW (ref 39.0–52.0)
Hemoglobin: 8.6 g/dL — ABNORMAL LOW (ref 13.0–17.0)
MCH: 30.5 pg (ref 26.0–34.0)
MCHC: 33.7 g/dL (ref 30.0–36.0)
MCV: 90.4 fL (ref 78.0–100.0)
Platelets: 139 10*3/uL — ABNORMAL LOW (ref 150–400)
RBC: 2.82 MIL/uL — ABNORMAL LOW (ref 4.22–5.81)
RDW: 17.5 % — ABNORMAL HIGH (ref 11.5–15.5)
WBC: 6.7 10*3/uL (ref 4.0–10.5)

## 2013-02-13 LAB — HEPARIN INDUCED THROMBOCYTOPENIA PNL
Heparin Induced Plt Ab: NEGATIVE
Patient O.D.: 0.063
UFH High Dose UFH H: 14 % Release
UFH Low Dose 0.1 IU/mL: 4 % Release
UFH Low Dose 0.5 IU/mL: 7 % Release
UFH SRA Result: NEGATIVE

## 2013-02-13 MED ORDER — POLYETHYLENE GLYCOL 3350 17 G PO PACK
17.0000 g | PACK | Freq: Every day | ORAL | Status: DC
  Administered 2013-02-13 – 2013-02-14 (×2): 17 g via ORAL
  Filled 2013-02-13 (×2): qty 1

## 2013-02-13 MED ORDER — SENNA 8.6 MG PO TABS
2.0000 | ORAL_TABLET | Freq: Once | ORAL | Status: AC
  Administered 2013-02-13: 17.2 mg via ORAL
  Filled 2013-02-13: qty 2

## 2013-02-13 NOTE — Consult Note (Signed)
Physical Medicine and Rehabilitation Consult  Reason for Consult: Deconditioning due to GIB Referring Physician:     HPI: [name] is a 72 y.o. male with history of AS, gout, morbid obesity; who was admitted on 05/19 with BRBPR with hypotension and lethargy. He required 4 units of blood as well as aggressive IVF in ED. He was evaluated by Dr. Benson Norway as well as Dr. Donne Hazel. Colonoscopy revealed large amount of blood and clots in colon and multiple superficial ulcers in proximal duodenum. Serial CBC recommended with avoidance of all NSAIDS.  He continued to have ongoing rectal bleeding requiring 10 total units PRBC. Mesenteric angiogram without source of bleed. He was transferred to ICU for closer monitoring. Repeat colonoscopy 5/23 revealed actively bleeding cecal AVMs which were treated with hemoclipping. He has had resolution of bleeding but is noted to be deconditioned. MD, PT recommending CIR.    ROS Past Medical History  Diagnosis Date  . Valvular heart disease   . Aortic stenosis   . Osteoarthritis   . Anemia   . OSA (obstructive sleep apnea)   . Bursitis   . Gout   . Arthritis   . Hyperlipidemia   . Morbid obesity   . DDD (degenerative disc disease)   . Mixed sensory-motor polyneuropathy   . Peroneal palsy     significant right foot drop  . Hemorrhoids     hx of bleeding  . Hypertension    Past Surgical History  Procedure Laterality Date  . Total knee arthroplasty      right  . Spine surgery    . Laminotomy  1193    c6-t2  . Hernia repair      umbilical hernia  . Knee surgery Right     multiple knee surgeries due to complication of R TKA  . Colonoscopy N/A 02/07/2013    Procedure: COLONOSCOPY;  Surgeon: Juanita Craver, MD;  Location: [facility] ENDOSCOPY;  Service: Endoscopy;  Laterality: N/A;  . Esophagogastroduodenoscopy N/A 02/07/2013    Procedure: ESOPHAGOGASTRODUODENOSCOPY (EGD);  Surgeon: Juanita Craver, MD;  Location: [facility] ENDOSCOPY;  Service: Endoscopy;  Laterality: N/A;    Family History  Problem Relation Age of Onset  . Heart disease Mother   . Heart disease Father    Social History:  reports that he quit smoking about 6 years ago. His smoking use included Cigarettes and Pipe. He smoked 0.00 packs per day. He does not have any smokeless tobacco history on file. He reports that he does not drink alcohol or use illicit drugs.  Allergies: No Known Allergies  Medications Prior to Admission  Medication Sig Dispense Refill  . atorvastatin (LIPITOR) 40 MG tablet Take 40 mg by mouth daily.        . cholecalciferol (VITAMIN D) 1000 UNITS tablet Take 1,000 Units by mouth daily.      [name] gabapentin (NEURONTIN) 100 MG capsule Take 200 mg by mouth 2 (two) times daily.      [name] lisinopril (PRINIVIL,ZESTRIL) 10 MG tablet Take 10 mg by mouth 2 (two) times daily.      . Multiple Vitamin (MULTIVITAMIN WITH MINERALS) TABS Take 1 tablet by mouth daily.      . sennosides-docusate sodium (SENOKOT-S) 8.6-50 MG tablet Take 1 tablet by mouth 2 (two) times daily.      . [DISCONTINUED] aspirin EC 81 MG tablet Take 81 mg by mouth daily.      . [DISCONTINUED] diclofenac (VOLTAREN) 75 MG EC tablet Take 75 mg by mouth 2 (two) times  daily.        Home: Home Living Lives With: Spouse Available Help at Discharge: Family Type of Home: House Home Access: Stairs to enter Technical brewer of Steps: 2 Home Layout: One level Bathroom Toilet: Handicapped height [place]: Raised toilet seat with rails;Youth worker - rolling;Reacher Additional Comments: Everything that pt sits on at home is built up to higher height.  Functional History: Prior Function Driving: Yes Vocation: Retired Comments: Amb household distances with rolling walker.  Uses transport chair or scooter when in community. Functional Status:  Mobility: Bed Mobility Bed Mobility: Supine to Sit;Sitting - Scoot to [employer] of Bed;Sit to Supine;Scooting to [facility] Supine to Sit: 1: +2 Total  assist Supine to Sit: Patient Percentage: 70% Sitting - Scoot to Edge of Bed: 5: Supervision Sit to Supine: 1: +2 Total assist Sit to Supine: Patient Percentage: 60% Scooting to HOB: 1: +2 Total assist Scooting to [facility]: Patient Percentage: 20% (bed in trendelenburg.) Transfers Transfers: Sit to Stand;Stand to Sit Sit to Stand: 1: +2 Total assist;From elevated surface;With upper extremity assist;From bed Sit to Stand: Patient Percentage: 80% Stand to Sit: 1: +2 Total assist;With upper extremity assist;To elevated surface;To bed Stand to Sit: Patient Percentage: 90% Ambulation/Gait Ambulation/Gait Assistance: 1: +2 Total assist Ambulation/Gait: Patient Percentage: 80% Ambulation Distance (Feet): 8 Feet Assistive device: Rolling walker Ambulation/Gait Assistance Details: Verbal cues to stand more erect Gait Pattern: Trunk flexed;Step-through pattern;Decreased stride length    ADL:    Cognition: Cognition Overall Cognitive Status: Within Functional Limits for tasks assessed Arousal/Alertness: Awake/alert Orientation Level: Oriented X4 Cognition Arousal/Alertness: Awake/alert Behavior During Therapy: WFL for tasks assessed/performed Overall Cognitive Status: Within Functional Limits for tasks assessed  Blood pressure 119/74, pulse 67, temperature 97.8 F (36.6 C), temperature source Oral, resp. rate 18, height 6\' 2"  (1.88 m), weight 164 kg (361 lb 8.9 oz), SpO2 97.00%.  Physical Exam  Nursing note and vitals reviewed.   Results for orders placed during the hospital encounter of 02/05/13 (from the past 24 hour(s))  CBC     Status: Abnormal   Collection Time    02/13/13 11:30 AM      Result Value Range   WBC 6.7  4.0 - 10.5 K/uL   RBC 2.82 (*) 4.22 - 5.81 MIL/uL   Hemoglobin 8.6 (*) 13.0 - 17.0 g/dL   HCT 25.5 (*) 39.0 - 52.0 %   MCV 90.4  78.0 - 100.0 fL   MCH 30.5  26.0 - 34.0 pg   MCHC 33.7  30.0 - 36.0 g/dL   RDW 17.5 (*) 11.5 - 15.5 %   Platelets 139 (*) 150 - 400  K/uL   No results found.  Assessment/Plan: Diagnosis: deconditioning related to GIB Comment:  This pt is well known to me. He was highly sedentary PTA. He is not far from baseline. He may go home with [facility] when medically appropriate for dc.            Meredith Staggers, MD, [place] [facility]   02/13/2013

## 2013-02-13 NOTE — Progress Notes (Signed)
[facility] PROGRESS NOTE  [name] [id number] DOB: 1941-03-13 DOA: 02/05/2013 PCP: Penni Homans, MD  Assessment/Plan: Acute lower GI bleed  Patient received 16 units PRBC for ongoing lower gi bleed .  Colonoscopy done initially without source of bleeding  CT angiogram done on 5/22 negative any area of bleeding. Capsule endoscopy on 5/23 negative per GI. Repeat colonoscopy done on 5/23 showed cecal AVMs which was ablated. No further bleeding and Hb improving.  Hold ASA and strictly avoid NSAIDs . Patient was taking dicofenac at home for knee pain.  Added iron supplements  appreciate GI and surgery consult.   Thrombocytopenia  Patient had drop in his platelets. Has not been on anticoagulation. HIT panel sent. Given uncommon though pertinent side effects of thrombocytopenia with Lipitor and neurontin was held. platelets now improving.   Hypotension  BP stable. Resume home meds on discharge   Aortic stenosis  TEE from 2011 cannot r/o AS. normal EF. No signs of CHF   OSA  continue home CPAP  Patient clinically stable for discharge home. Has not had further Lower GI bleed. Hb stable. dispo  patient wishes to go to SNF. SW consulted  Code Status: full code  Family Communication:wife  at bedside  Consultants:  Benson Norway / Collene Mares  CCS Procedures:  EGD/ Colonoscopy on 5/21 and 5/23  Capsule endoscopy on 5/23   HPI/Subjective: Discharge on 5/26 held as patient quite weak. PT recommended CIR consult. Seen by CIR today who recommend HHPT Vs SNF  Objective: Filed Vitals:   02/12/13 1319 02/12/13 2108 02/13/13 0622 02/13/13 1410  BP: 158/68 141/53 119/74 120/68  Pulse: 74 71 67 71  Temp: 99.3 F (37.4 C) 98.6 F (37 C) 97.8 F (36.6 C) 98.3 F (36.8 C)  TempSrc: Oral Oral Oral Oral  Resp: 20 20 18 20   Height:      Weight:      SpO2: 95% 96% 97% 97%    Intake/Output Summary (Last 24 hours) at 02/13/13 1611 Last data filed at 02/13/13 1300  Gross per 24 hour   Intake    483 ml  Output   2025 ml  Net  -1542 ml   Filed Weights   02/10/13 0015 02/11/13 0500 02/11/13 1806  Weight: 178 kg (392 lb 6.7 oz) 164 kg (361 lb 8.9 oz) 164 kg (361 lb 8.9 oz)    Exam:  General: Elderly obese male in NAD  HEENT: pallor+, Moist mucosa  Cardiovascular: [id number], 3/6 systolic murmurs, rubs or gallop  Respiratory: clear b/l, no added sounds  Abdomen: soft, obese, NT, ND, BS+.  Musculoskeletal: warm, chronic venous stasis over b/l leg  CNS: AAOX3   Data Reviewed: Basic Metabolic Panel:  Recent Labs Lab 02/07/13 0950 02/08/13 0355  NA 138 136  K 4.1 3.9  CL 108 107  CO2 20 18*  GLUCOSE 105* 113*  BUN 36* 28*  CREATININE 1.11 1.09  CALCIUM 7.2* 7.0*   Liver Function Tests:  Recent Labs Lab 02/07/13 0950  AST 19  ALT 16  ALKPHOS 43  BILITOT 0.5  PROT 3.8*  ALBUMIN 2.2*   No results found for this basename: LIPASE, AMYLASE,  in the last 168 hours No results found for this basename: AMMONIA,  in the last 168 hours CBC:  Recent Labs Lab 02/10/13 2040 02/11/13 0236 02/11/13 0833 02/12/13 0540 02/13/13 1130  WBC 6.6 7.1 6.4 6.6 6.7  HGB 8.3* 8.1* 8.4* 8.1* 8.6*  HCT 23.7* 23.8* 24.5* 23.5* 25.5*  MCV 86.2 87.5  87.8 88.3 90.4  PLT 105* 107* 113* 123* 139*   Cardiac Enzymes: No results found for this basename: CKTOTAL, CKMB, CKMBINDEX, TROPONINI,  in the last 168 hours BNP (last 3 results) No results found for this basename: PROBNP,  in the last 8760 hours CBG:  Recent Labs Lab 02/10/13 0521  GLUCAP 93    Recent Results (from the past 240 hour(s))  MRSA PCR SCREENING     Status: None   Collection Time    02/05/13  9:53 PM      Result Value Range Status   MRSA by PCR NEGATIVE  NEGATIVE Final   Comment:            The GeneXpert MRSA Assay (FDA     approved for NASAL specimens     only), is one component of a     comprehensive MRSA colonization     surveillance program. It is not     intended to diagnose MRSA      infection nor to guide or     monitor treatment for     MRSA infections.     Studies: No results found.  Scheduled Meds: . cholecalciferol  1,000 Units Oral Daily  . gabapentin  200 mg Oral BID  . sodium chloride  3 mL Intravenous Q12H   Continuous Infusions:      Time spent: 25 minutes    Taeko Schaffer, [place]  [facility] Pager [phone] If 7PM-7AM, please contact night-coverage at [email or web address], password [facility] 02/13/2013, 4:11 PM  LOS: 8 days

## 2013-02-13 NOTE — Progress Notes (Signed)
Rehab admissions - Evaluated for possible admission.  Please see rehab consult done by Dr. Naaman Plummer today recommending home with [facility] therapies.  No need for acute inpatient rehab admission.  Call me for questions.  [phone]

## 2013-02-13 NOTE — Progress Notes (Signed)
PT Progress Note   02/13/13 1545  PT Visit Information  Last PT Received On 02/13/13  Assistance Needed +2  PT Time Calculation  PT Start Time 1515  PT Stop Time 1536  PT Time Calculation (min) 21 min  Subjective Data  Subjective Pt states he needs to be able to walk better before going home.  Precautions  Precautions Fall  Cognition  Arousal/Alertness Awake/alert  Behavior During Therapy WFL for tasks assessed/performed  Overall Cognitive Status Within Functional Limits for tasks assessed  Bed Mobility  Sit to Supine 1: +2 Total assist  Sit to Supine: Patient Percentage 60%  Details for Bed Mobility Assistance (Assist to bring legs up and to rotate trunk around)  Transfers  Transfers Stand Pivot Transfers  Sit to Stand 1: +2 Total assist;From elevated surface;With upper extremity assist;With armrests;From chair/3-in-1  Sit to Stand: Patient Percentage 80%  Stand to Sit 1: +2 Total assist;With upper extremity assist;To bed;To elevated surface  Stand to Sit: Patient Percentage 90%  Stand Pivot Transfers 1: +2 Total assist;With armrests;From elevated surface  Stand Pivot Transfers: Patient Percentage 80%  Static Standing Balance  Static Standing - Balance Support Bilateral upper extremity supported  Static Standing - Level of Assistance 4: Min assist  PT - End of Session  Activity Tolerance Patient tolerated treatment well  Patient left in bed;with call bell/phone within reach;with family/visitor present  Nurse Communication Mobility status  PT - Assessment/Plan  Comments on Treatment Session Pt with GI bleed.  Pt making steady progress.  CIR denied pt.  Pt not able to return home at this level of function.  Pt now agreeable to ST-SNF.  PT Plan Discharge plan needs to be updated;Frequency remains appropriate  PT Frequency Min 3X/week  Follow Up Recommendations SNF  PT equipment None recommended by PT  Acute Rehab PT Goals  PT Goal: Sit to Supine/Side - Progress Progressing  toward goal  PT Goal: Sit to Stand - Progress Progressing toward goal  PT Goal: Stand to Sit - Progress Progressing toward goal  PT Transfer Goal: Bed to Chair/Chair to Bed - Progress Progressing toward goal  PT Goal: Ambulate - Progress Progressing toward goal  PT General Charges  $$ ACUTE PT VISIT 1 Procedure  PT Treatments  $Gait Training 23-37 mins   [facility] PT [phone]

## 2013-02-13 NOTE — Progress Notes (Signed)
Physical Therapy Treatment Patient Details Name: [name] MRN: [id number] DOB: [birth year] Today's Date: 02/13/2013 Time: [id number] PT Time Calculation (min): 23 min  PT Assessment / Plan / Recommendation Comments on Treatment Session  Pt with GI bleed.  Pt making steady progress.  Feel he could benefit from CIR.    Follow Up Recommendations  CIR     Does the patient have the potential to tolerate intense rehabilitation     Barriers to Discharge        Equipment Recommendations  None recommended by PT    Recommendations for Other Services Rehab consult  Frequency Min 3X/week   Plan Discharge plan remains appropriate;Frequency remains appropriate    Precautions / Restrictions Precautions Precautions: Fall   Pertinent Vitals/Pain N/A    Mobility  Bed Mobility Supine to Sit: 1: +2 Total assist Supine to Sit: Patient Percentage: 70% Sitting - Scoot to Edge of Bed: 5: Supervision Transfers Sit to Stand: 1: +2 Total assist;From elevated surface;With upper extremity assist;From bed Sit to Stand: Patient Percentage: 80% Stand to Sit: 1: +2 Total assist;With upper extremity assist;To elevated surface;To bed Stand to Sit: Patient Percentage: 90% Details for Transfer Assistance: Raised bed up prior to standing. Ambulation/Gait Ambulation/Gait Assistance: 1: +2 Total assist Ambulation/Gait: Patient Percentage: 80% Ambulation Distance (Feet): 8 Feet Assistive device: Rolling walker Ambulation/Gait Assistance Details: Verbal cues to stand more erect Gait Pattern: Trunk flexed;Step-through pattern;Decreased stride length    Exercises     PT Diagnosis:    PT Problem List:   PT Treatment Interventions:     PT Goals Acute Rehab PT Goals PT Goal: Supine/Side to Sit - Progress: Progressing toward goal PT Goal: Sit to Supine/Side - Progress: Progressing toward goal PT Goal: Sit to Stand - Progress: Progressing toward goal PT Goal: Stand to Sit - Progress: Progressing  toward goal PT Transfer Goal: Bed to Chair/Chair to Bed - Progress: Progressing toward goal PT Goal: Ambulate - Progress: Progressing toward goal  Visit Information  Last PT Received On: 02/13/13 Assistance Needed: +2    Subjective Data  Subjective: Pt states he is glad to get out of the bed   Cognition  Cognition Arousal/Alertness: Awake/alert Behavior During Therapy: WFL for tasks assessed/performed Overall Cognitive Status: Within Functional Limits for tasks assessed    Balance  Static Standing Balance Static Standing - Balance Support: Bilateral upper extremity supported Static Standing - Level of Assistance: 4: Min assist  End of Session PT - End of Session Activity Tolerance: Patient tolerated treatment well Patient left: in chair;with call bell/phone within reach;with family/visitor present Nurse Communication: Mobility status   GP     [facility] 02/13/2013, 12:23 PM  [employer] PT [phone]

## 2013-02-13 NOTE — Progress Notes (Signed)
Nutrition Brief Note  Malnutrition Screening Tool result is inaccurate.  Please consult if nutrition needs are identified.  Orson Slick RD, LDN Pager [phone] After Hours pager [phone]

## 2013-02-14 ENCOUNTER — Encounter (HOSPITAL_COMMUNITY): Payer: Self-pay | Admitting: Gastroenterology

## 2013-02-14 LAB — HEMOGLOBIN AND HEMATOCRIT, BLOOD
HCT: 26.9 % — ABNORMAL LOW (ref 39.0–52.0)
Hemoglobin: 9.1 g/dL — ABNORMAL LOW (ref 13.0–17.0)

## 2013-02-14 MED ORDER — POLYETHYLENE GLYCOL 3350 17 G PO PACK
17.0000 g | PACK | Freq: Every day | ORAL | Status: DC
  Filled 2013-02-14: qty 1

## 2013-02-14 MED ORDER — POLYETHYLENE GLYCOL 3350 17 G PO PACK
17.0000 g | PACK | Freq: Two times a day (BID) | ORAL | Status: DC
  Administered 2013-02-14 – 2013-02-16 (×4): 17 g via ORAL
  Filled 2013-02-14 (×5): qty 1

## 2013-02-14 NOTE — Progress Notes (Signed)
Pt's heart rate kept dropping. RN paged MD on call who stated to continue to monitor pt. Pt is asymptomatic.

## 2013-02-14 NOTE — Progress Notes (Signed)
RT Note: Pt has home CPAP at bedside and plans to wear tonight. Pt states he does not need any further assistance. Rt will continue to monitor

## 2013-02-14 NOTE — Progress Notes (Signed)
Physical Therapy Treatment Patient Details Name: [name] MRN: [id number] DOB: 1941/09/11 Today's Date: 02/14/2013 Time: [id number] PT Time Calculation (min): 20 min  PT Assessment / Plan / Recommendation Comments on Treatment Session  Pt needed extra assist for standing but was able to step a little further and assist more with sit to supine.   Knee supports removed in supine and left leg elevated on one pillow to assist with relief of edema. He is ready for continued PT at SNF    Follow Up Recommendations  SNF     Does the patient have the potential to tolerate intense rehabilitation     Barriers to Discharge        Equipment Recommendations  None recommended by PT    Recommendations for Other Services    Frequency Min 3X/week   Plan Discharge plan remains appropriate    Precautions / Restrictions Precautions Precautions: Fall   Pertinent Vitals/Pain No c/o    Mobility  Bed Mobility Bed Mobility: Sit to Supine;Scooting to HOB Supine to Sit: 1: +2 Total assist Supine to Sit: Patient Percentage: 70% Sitting - Scoot to Edge of Bed: 5: Supervision Sit to Supine: 1: +2 Total assist Sit to Supine: Patient Percentage: 50% Scooting to [facility]: Patient Percentage: 30% Details for Bed Mobility Assistance: Be in [place] and feet stabalized on bed in order for pt to push himself up to head of bed (Assist to bring legs up and to rotate trunk around) Transfers Transfers: Sit to Stand;Stand to Sit Sit to Stand: 1: +2 Total assist;From elevated surface;With upper extremity assist;With armrests;From chair/3-in-1 Sit to Stand: Patient Percentage: 60% Stand to Sit: 1: +2 Total assist;With upper extremity assist;To bed;To elevated surface Stand to Sit: Patient Percentage: 90% Stand Pivot Transfers: Patient Percentage: 80% Details for Transfer Assistance: Pt needed 2 attempts and extra time to push up with assist at ischial tuberosities to push him up and  forward Ambulation/Gait Ambulation/Gait Assistance: 1: +2 Total assist Ambulation/Gait: Patient Percentage: 80% Ambulation Distance (Feet): 4 Feet Assistive device: Rolling walker Ambulation/Gait Assistance Details: Pt needs assist for safety.  Pt appears very unsteady on his feet Gait Pattern: Step-to pattern;Decreased step length - right;Decreased step length - left Gait velocity: decreased General Gait Details: Pt appears anxious and unsteady on his feet Stairs: No Wheelchair Mobility Wheelchair Mobility: No    Exercises General Exercises - Lower Extremity Ankle Circles/Pumps: AAROM;Both;10 reps;Supine Quad Sets: AROM;Both;10 reps;Supine Gluteal Sets: AROM;Both;10 reps;Supine;Standing Short Arc Quad: AROM;Both;10 reps;Supine Hip ABduction/ADduction: AROM;AAROM;Both;10 reps;Supine;Sidelying Hip Flexion/Marching: AROM;AAROM;Both;10 reps;Supine;Sidelying   PT Diagnosis:    PT Problem List:   PT Treatment Interventions:     PT Goals Acute Rehab PT Goals PT Goal: Supine/Side to Sit - Progress: Progressing toward goal PT Goal: Sit to Supine/Side - Progress: Progressing toward goal PT Goal: Sit to Stand - Progress: Progressing toward goal PT Goal: Stand to Sit - Progress: Progressing toward goal PT Transfer Goal: Bed to Chair/Chair to Bed - Progress: Progressing toward goal PT Goal: Ambulate - Progress: Progressing toward goal  Visit Information  Last PT Received On: 02/14/13    Subjective Data  Subjective: "I want it fixed!"  pt upset that he is not able to walk better Patient Stated Goal: Return home.   Cognition  Cognition Arousal/Alertness: Awake/alert Behavior During Therapy: WFL for tasks assessed/performed Overall Cognitive Status: Within Functional Limits for tasks assessed    Balance  Static Standing Balance Static Standing - Balance Support: Bilateral upper extremity supported Static Standing - Level  of Assistance: 4: Min assist  End of Session PT - End of  Session Activity Tolerance: Patient tolerated treatment well Patient left: in bed;with call bell/phone within reach;with family/visitor present   GP     Norwood Levo 02/14/2013, 11:25 AM

## 2013-02-14 NOTE — Progress Notes (Signed)
Bed offers given to patient and his wife and they have chosen [facility]. Mrs. Runnion has signed admission papers today with facility after taking a tour.  Awaiting stability per MD for d/c.  Patient will have a private room and facility is ordering a bariatric bed for patient.  CSW will continue to monitor. FL2 to be placed on chart for MD's signature.  Lorie Phenix. [place], [place]

## 2013-02-14 NOTE — Progress Notes (Signed)
Addendum  Patient seen and examined, chart and data base reviewed.  I agree with the above assessment and plan.  For full details please see Mrs. Imogene Burn PA note.  Acute lower GI bleed, S/P transfusion of 16 units of pRBC.  Monitor for one more day.   Birdie Hopes, MD [facility] Pager: [phone] 02/14/2013, 12:29 PM

## 2013-02-14 NOTE — Progress Notes (Signed)
Patient lying in bed asleep.Heart rate dropped to 28 nonsustained rhythm changed to second degree heart block type 2.Patient asymptomatic blood pressure 114/68 will notify MD on call.Strips posted on chart.

## 2013-02-14 NOTE — Progress Notes (Signed)
[facility] PROGRESS NOTE  BARUCH BEHRMAN [id number] DOB: 1941/09/07 DOA: 02/05/2013 PCP: Penni Homans, MD  Assessment/Plan: Acute lower GI bleed  2 episodes of blood (small amount) with passing gas overnight.  No drop in hgb.  D/w Dr. Benson Norway.  Will monitor for another 24 hours inpatient. May need IR to evaluate if he continues to bleed.  Will add miralax today to try to clean out old blood. Patient received 16 units PRBC for ongoing lower gi bleed .  Colonoscopy done initially without source of bleeding  CT angiogram done on 5/22 negative any area of bleeding. Capsule endoscopy on 5/23 negative per GI. Repeat colonoscopy done on 5/23 showed cecal AVMs which was ablated. Hold ASA and strictly avoid NSAIDs . Patient was taking dicofenac at home for knee pain.  Added iron supplements  appreciate GI and surgery consult.   Thrombocytopenia  Patient had drop in his platelets. Has not been on anticoagulation. HIT panel sent. Given uncommon though pertinent side effects of thrombocytopenia with Lipitor and neurontin was held. platelets now improving.   Hypotension  BP stable. Resume home meds on discharge   Aortic stenosis  TEE from 2011 cannot r/o AS. normal EF. No signs of CHF   OSA  continue home CPAP  Patient clinically stable for discharge home. Has not had further Lower GI bleed. Hb stable. dispo  patient wishes to go to SNF. SW consulted  Code Status: full code  Family Communication:wife  at bedside   Consultants:  Benson Norway / Collene Mares  CCS  Procedures:  EGD/ Colonoscopy on 5/21 and 5/23  Capsule endoscopy on 5/23   HPI/Subjective: Patient had two episodes of expelling gas and blood in the last twenty four hours.  Objective: Filed Vitals:   02/13/13 0622 02/13/13 1410 02/13/13 2212 02/14/13 0537  BP: 119/74 120/68 137/42 113/70  Pulse: 67 71 72 57  Temp: 97.8 F (36.6 C) 98.3 F (36.8 C) 98.1 F (36.7 C) 98.1 F (36.7 C)  TempSrc: Oral Oral Oral Oral  Resp: 18  20 20 18   Height:      Weight:      SpO2: 97% 97% 97% 99%    Intake/Output Summary (Last 24 hours) at 02/14/13 1122 Last data filed at 02/14/13 0545  Gross per 24 hour  Intake    240 ml  Output   1100 ml  Net   -860 ml   Filed Weights   02/10/13 0015 02/11/13 0500 02/11/13 1806  Weight: 178 kg (392 lb 6.7 oz) 164 kg (361 lb 8.9 oz) 164 kg (361 lb 8.9 oz)    Exam:  General: Elderly obese male in NAD, talkative HEENT: pallor+, Moist mucosa  Cardiovascular: [id number], 3/6 systolic murmurs, rubs or gallop  Respiratory: clear b/l, no added sounds  Abdomen: soft, obese, NT, ND, BS+.  Musculoskeletal: warm, chronic venous stasis over b/l leg  CNS: AAOX3   Data Reviewed: Basic Metabolic Panel:  Recent Labs Lab 02/08/13 0355  NA 136  K 3.9  CL 107  CO2 18*  GLUCOSE 113*  BUN 28*  CREATININE 1.09  CALCIUM 7.0*   CBC:  Recent Labs Lab 02/10/13 2040 02/11/13 0236 02/11/13 0833 02/12/13 0540 02/13/13 1130 02/14/13 1030  WBC 6.6 7.1 6.4 6.6 6.7  --   HGB 8.3* 8.1* 8.4* 8.1* 8.6* 9.1*  HCT 23.7* 23.8* 24.5* 23.5* 25.5* 26.9*  MCV 86.2 87.5 87.8 88.3 90.4  --   PLT 105* 107* 113* 123* 139*  --    CBG:  Recent Labs Lab 02/10/13 0521  GLUCAP 93    Recent Results (from the past 240 hour(s))  MRSA PCR SCREENING     Status: None   Collection Time    02/05/13  9:53 PM      Result Value Range Status   MRSA by PCR NEGATIVE  NEGATIVE Final   Comment:            The GeneXpert MRSA Assay (FDA     approved for NASAL specimens     only), is one component of a     comprehensive MRSA colonization     surveillance program. It is not     intended to diagnose MRSA     infection nor to guide or     monitor treatment for     MRSA infections.     Studies: No results found.  Scheduled Meds: . cholecalciferol  1,000 Units Oral Daily  . gabapentin  200 mg Oral BID  . polyethylene glycol  17 g Oral Daily  . sodium chloride  3 mL Intravenous Q12H   Continuous  Infusions:      Time spent: 25 minutes    Karen Kitchens  [facility] Pager [phone] If 7PM-7AM, please contact night-coverage at [email or web address], password [facility] 02/14/2013, 11:22 AM  LOS: 9 days

## 2013-02-14 NOTE — Progress Notes (Signed)
Physical Therapy Treatment Patient Details Name: [name] MRN: [id number] DOB: 01/25/1941 Today's Date: 02/14/2013 Time: [id number] PT Time Calculation (min): 51 min  PT Assessment / Plan / Recommendation Comments on Treatment Session  pt insists on wearing 2 knee supports on left knee.  Note that he has edema and below support when it was removed. Pt with bilateral hemosiderin staining and fragile skin at baseline.  He has  small areas on both legs covered with allevyn.  Recommend supports be removed and skin be moistureiezed wit eucerin cream like product.  Please order if you agree.  Pt had much difficutly coming from sit to stand today and became frustrated and upset at his difficutly  He will need continued PT with 24/7 care at d/c/ Reommend continued PT at SNF    Follow Up Recommendations  SNF     Does the patient have the potential to tolerate intense rehabilitation     Barriers to Discharge        Equipment Recommendations  None recommended by PT    Recommendations for Other Services    Frequency Min 3X/week   Plan Discharge plan remains appropriate    Precautions / Restrictions Precautions Precautions: Fall   Pertinent Vitals/Pain Pt c/o pain in legs after movement today    Mobility  Bed Mobility Bed Mobility: Supine to Sit;Sit to Supine;Scooting to HOB Supine to Sit: 1: +2 Total assist Supine to Sit: Patient Percentage: 70% Sitting - Scoot to Edge of Bed: 5: Supervision Sit to Supine: 1: +2 Total assist Sit to Supine: Patient Percentage: 10% Details for Bed Mobility Assistance: pt needs to have environment set up exactly right to  be able to move himself (Assist to bring legs up and to rotate trunk around) Transfers Transfers: Sit to Stand;Stand to Sit Sit to Stand: 1: +2 Total assist;From elevated surface;With upper extremity assist;With armrests;From chair/3-in-1 Sit to Stand: Patient Percentage: 80% Stand to Sit: 1: +2 Total assist;With upper extremity  assist;To bed;To elevated surface Stand to Sit: Patient Percentage: 90% Stand Pivot Transfers: Patient Percentage: 80% Details for Transfer Assistance: On first attempt, pt was not able to stand up from bed.  we had to total assist him back to supine to rest and then he wanted to try again. from elevated bed he was able to stand, but he was not able to stand from his wheelchair after having  walked a few steps Ambulation/Gait Ambulation/Gait Assistance: 1: +2 Total assist Ambulation/Gait: Patient Percentage: 80% Ambulation Distance (Feet): 3 Feet Assistive device: Rolling walker Ambulation/Gait Assistance Details: Pt reports he was having difficulty standing and stepping today Gait Pattern: Step-to pattern;Decreased step length - right;Decreased step length - left Gait velocity: decreased General Gait Details: Pt appears anxious and unsteady on his feet Stairs: No Wheelchair Mobility Wheelchair Mobility: No    Exercises General Exercises - Lower Extremity Ankle Circles/Pumps: AAROM;Both;10 reps;Supine Quad Sets: AROM;Both;10 reps;Supine Gluteal Sets: AROM;Both;10 reps;Supine;Standing Short Arc Quad: AROM;Both;10 reps;Supine Hip ABduction/ADduction: AROM;AAROM;Both;10 reps;Supine;Sidelying Hip Flexion/Marching: AROM;AAROM;Both;10 reps;Supine;Sidelying   PT Diagnosis:    PT Problem List:   PT Treatment Interventions:     PT Goals Acute Rehab PT Goals PT Goal: Supine/Side to Sit - Progress: Progressing toward goal PT Goal: Sit to Supine/Side - Progress: Progressing toward goal PT Goal: Sit to Stand - Progress: Not progressing PT Goal: Stand to Sit - Progress: Not progressing PT Transfer Goal: Bed to Chair/Chair to Bed - Progress: Not progressing PT Goal: Ambulate - Progress: Not progressing  Visit Information  Last PT Received On: 02/14/13    Subjective Data  Subjective: I need to have those braces on for me  to walk Patient Stated Goal: Return home.   Cognition   Cognition Arousal/Alertness: Awake/alert Behavior During Therapy: WFL for tasks assessed/performed Overall Cognitive Status: Within Functional Limits for tasks assessed    Balance  Static Standing Balance Static Standing - Balance Support: Bilateral upper extremity supported Static Standing - Level of Assistance: 4: Min assist  End of Session     GP    Teresa K. Owens Shark, [place] 02/14/2013, 10:55 AM

## 2013-02-15 ENCOUNTER — Telehealth: Payer: Self-pay | Admitting: Internal Medicine

## 2013-02-15 ENCOUNTER — Inpatient Hospital Stay (HOSPITAL_COMMUNITY): Payer: Medicare Other

## 2013-02-15 DIAGNOSIS — R609 Edema, unspecified: Secondary | ICD-10-CM

## 2013-02-15 DIAGNOSIS — I498 Other specified cardiac arrhythmias: Secondary | ICD-10-CM

## 2013-02-15 LAB — CBC
HCT: 24.7 % — ABNORMAL LOW (ref 39.0–52.0)
Hemoglobin: 8 g/dL — ABNORMAL LOW (ref 13.0–17.0)
MCH: 29.3 pg (ref 26.0–34.0)
MCHC: 32.4 g/dL (ref 30.0–36.0)
MCV: 90.5 fL (ref 78.0–100.0)
Platelets: 172 10*3/uL (ref 150–400)
RBC: 2.73 MIL/uL — ABNORMAL LOW (ref 4.22–5.81)
RDW: 17.1 % — ABNORMAL HIGH (ref 11.5–15.5)
WBC: 6.3 10*3/uL (ref 4.0–10.5)

## 2013-02-15 MED ORDER — FUROSEMIDE 10 MG/ML IJ SOLN
40.0000 mg | Freq: Once | INTRAMUSCULAR | Status: AC
  Administered 2013-02-15: 40 mg via INTRAVENOUS
  Filled 2013-02-15: qty 4

## 2013-02-15 MED ORDER — POTASSIUM CHLORIDE CRYS ER 20 MEQ PO TBCR
40.0000 meq | EXTENDED_RELEASE_TABLET | Freq: Once | ORAL | Status: AC
  Administered 2013-02-15: 40 meq via ORAL
  Filled 2013-02-15: qty 2

## 2013-02-15 MED ORDER — MAGNESIUM HYDROXIDE 400 MG/5ML PO SUSP
30.0000 mL | Freq: Once | ORAL | Status: AC
  Administered 2013-02-15: 30 mL via ORAL
  Filled 2013-02-15: qty 30

## 2013-02-15 MED ORDER — FUROSEMIDE 40 MG PO TABS
40.0000 mg | ORAL_TABLET | Freq: Once | ORAL | Status: AC
  Administered 2013-02-15: 40 mg via ORAL
  Filled 2013-02-15: qty 1

## 2013-02-15 NOTE — Progress Notes (Signed)
Clinical Social Work Department CLINICAL SOCIAL WORK PLACEMENT NOTE 02/15/2013  Patient:  [name], [name]  Account Number:  [id number] Admit date:  02/05/2013  Clinical Social Worker:  Sindy Messing, LCSW  Date/time:  02/14/2013 02:00 PM  Clinical Social Work is seeking post-discharge placement for this patient at the following level of care:   [place]   (*CSW will update this form in Epic as items are completed)   02/14/2013  Patient/family provided with [place] Department of Clinical Social Work's list of facilities offering this level of care within the geographic area requested by the patient (or if unable, by the patient's family).  02/14/2013  Patient/family informed of their freedom to choose among providers that offer the needed level of care, that participate in Medicare, Medicaid or managed care program needed by the patient, have an available bed and are willing to accept the patient.  02/14/2013  Patient/family informed of [facility]' ownership interest in [facility], as well as of the fact that they are under no obligation to receive care at this facility.  PASARR submitted to EDS on 02/15/2013 PASARR number received from EDS on 02/15/2013  FL2 transmitted to all facilities in geographic area requested by pt/family on  02/14/2013 FL2 transmitted to all facilities within larger geographic area on   Patient informed that his/her managed care company has contracts with or will negotiate with  certain facilities, including the following:     Patient/family informed of bed offers received:  02/14/2013 Patient chooses bed at [place] Physician recommends and patient chooses bed at    Patient to be transferred to [place] on  02/16/2013 Patient to be transferred to facility by [facility]  The following physician request were entered in Epic:   Additional Comments:

## 2013-02-15 NOTE — Progress Notes (Addendum)
Patient ID: [name], male   DOB: Mar 10, 1941, 72 y.o.   MRN: [id number]    Subjective:  Denies SSCP, palpitations or Dyspnea No further pauses  Objective:  Filed Vitals:   02/14/13 2100 02/14/13 2330 02/15/13 0218 02/15/13 0500  BP: 147/68 114/68 121/52 106/49  Pulse: 73  69 63  Temp: 97.7 F (36.5 C)  97.5 F (36.4 C) 97.6 F (36.4 C)  TempSrc: Oral  Oral Oral  Resp: 18  18 20   Height:      Weight:      SpO2: 98%  94% 96%    Intake/Output from previous day:  Intake/Output Summary (Last 24 hours) at 02/15/13 0830 Last data filed at 02/15/13 0600  Gross per 24 hour  Intake    820 ml  Output   2300 ml  Net  -1480 ml    Physical Exam: Affect appropriate Morbidly obese male HEENT: normal Neck supple with no adenopathy JVP normal no bruits no thyromegaly Lungs clear with no wheezing and good diaphragmatic motion Heart:  S1/S2 diminished AS/AR  murmur, no rub, gallop or click PMI normal Abdomen: benighn, BS positve, no tenderness, no AAA no bruit.  No HSM or HJR Distal pulses intact with no bruits Plus 2 edema with venous stasis and dressings on skin break down Neuro non-focal Skin warm and dry LE weak with bilateral TKR;s      Recent Labs  02/13/13 1130 02/14/13 1030 02/15/13 0440  WBC 6.7  --  6.3  HGB 8.6* 9.1* 8.0*  HCT 25.5* 26.9* 24.7*  MCV 90.4  --  90.5  PLT 139*  --  172    Imaging: No results found.  Cardiac Studies:  ECG:  SR ICRBBB no high grade AV block   Telemetry: NSR no further pauses  Echo:   Medications:   . cholecalciferol  1,000 Units Oral Daily  . gabapentin  200 mg Oral BID  . polyethylene glycol  17 g Oral BID  . sodium chloride  3 mL Intravenous Q12H       Assessment/Plan:  Bradycardia:  No indication for pacer.  Make sure sats stay above 90% with CPAP at night  Baseline ECG low risk for advanced AV node conduction problem AS:  This is more of an issue 3 years ago mean gradient by TEE was 68mHg.  He is nearly  bed -ridden and gets around mostly by scooter.  Activity not limited By dyspnea chest pain or presyncope.  Limited by leg weakness and instability s/p bilateral knee replacements.  He is not an open surgical candidate due to poor functional  Capacity and comorbidities.  On face would also appear to be a poor TAVR candidate.  Should recover from this 16 unit bleed. Will f/u in office and have Dr Burt Knack see in [facility] as outpatient.  Will likely need another TEE  Jenkins Rouge 02/15/2013, 8:30 AM

## 2013-02-15 NOTE — Progress Notes (Signed)
Event: Notified by RN that pt in and out of 1st and 2nd degree block, became bradycardiac and then noted to have 6.25s sinus arrest. Pt unaware and asymptomatic.  Subjective: Pt reports he was resting well when the RN came in to wake him up and ask if he was aware of any symptoms. Pt denies CP, SOB or any unusual symptoms.  Objective: Mr. Gaither is a 72 year old morbidly obese male with hx of valvular heart ds, AS, OSA and anemia admitted with BRBPR and anemia. Received 10 units PRBC since admission. Colonoscopy done on 5/21 showed large amount of blood in colon but no source of bleeding. EGD showed multiple superficial ulcers in proximal duodenum but again no source of bleeding. Patient continues to have BRBPR . At bedside pt noted resting comfortably on CPAP. Skin w/d color somewhat pale. Current VS T-97.7, [id number], P-70, R18 w/ 02 sats of 98% on r/a. Current EKG shows SR w/ 1st degree AV block and an incomplete RBBB which pt's baseline rhythm. Record indicates pt is being prepared for d/c to SNF. Review of rhythm strip reveals 6.25s pause that appears to have several  non-conducted p-waves c/w 3rd degree HB.  Assessment/Plan: 1. Bradycardia w/ 6.25s pause in clinical setting of pt who is morbidly obese w/ h/o severe aortic stenosis, OSA on CPAP. Pause c/w 3rd degree HB. Pt asymptomatic. VSS. Discussed pt w/ Dr Colon Flattery who agreed to evaluate pt. Plan was to transfer to cardiac SDU, however Dr Colon Flattery did see pt and request transfer be cancelled for now. Someone from [facility] will see pt in am. Will continue to monitor closely.  Jeryl Columbia, NP-C [facility] Pager

## 2013-02-15 NOTE — Progress Notes (Signed)
Clinical Social Work Department BRIEF PSYCHOSOCIAL ASSESSMENT 02/15/2013  Patient:  [name], [name]     Account Number:  [id number]     Admit date:  02/05/2013  Clinical Social Worker:  Earlie Server  Date/Time:  02/15/2013 02:00 PM  Referred by:  Physician  Date Referred:  02/15/2013 Referred for  SNF Placement   Other Referral:   Interview type:  Patient Other interview type:    PSYCHOSOCIAL DATA Living Status:  FAMILY Admitted from facility:   Level of care:   Primary support name:  [name] Primary support relationship to patient:  SPOUSE Degree of support available:   Strong    CURRENT CONCERNS Current Concerns  Post-Acute Placement   Other Concerns:    SOCIAL WORK ASSESSMENT / PLAN CSW received referral to assist with SNF placement.    Patient has already received SNF list and has received offers. [facility] has chosen [facility] and wife has completed paperwork.  CSW has kept SNF and [employer] updated on patient's DC plans.    Patient plans to DC on 02/16/13 to SNF.   Assessment/plan status:  Psychosocial Support/Ongoing Assessment of Needs Other assessment/ plan:   Information/referral to community resources:   SNF information    PATIENT'S/FAMILY'S RESPONSE TO PLAN OF CARE: Patient and wife agreeable to SNF placement. Patient and wife aware of DC plans and agreeable to [employer].

## 2013-02-15 NOTE — Progress Notes (Signed)
Pt gave permission for nurse to use aromatherapy on right hand arthritis pain. Also applied to bilateral lower extremity blisters. Used 6 drops of lavender in 16 oz. Of water with a small amount of witch hazel for emulsifying. Applied soaked compresses to areas for 1 hour. Will repeat 1 hour on and 1 hour off. Will continue to monitor  Pt condition.  Devoria Albe RN

## 2013-02-15 NOTE — Progress Notes (Signed)
[facility] PROGRESS NOTE  [name] [id number] DOB: Nov 10, 1940 DOA: 02/05/2013 PCP: Penni Homans, MD  Assessment/Plan: Acute lower GI bleed  5/29 hgb change from 9.1 ->8.0 but still essentially within a margin of error. No bowel movements overnight - no episodes of bleeding. May need IR to evaluate if he continues to bleed.   Patient received 16 units PRBC for ongoing lower gi bleed .  Colonoscopy done initially without source of bleeding  CT angiogram done on 5/22 negative any area of bleeding. Capsule endoscopy on 5/23 negative per GI. Repeat colonoscopy done on 5/23 showed cecal AVMs which were ablated. Hold ASA and strictly avoid NSAIDs . Patient was taking dicofenac at home for knee pain.  Added iron supplements  appreciate GI and surgery consult.   Thrombocytopenia  Patient had drop in his platelets. Has not been on anticoagulation. HIT panel sent. Given uncommon though pertinent side effects of thrombocytopenia with Lipitor and neurontin was held. platelets now improving.   Hypotension  BP stable. Resume home meds on discharge   Aortic stenosis  TEE from 2011 cannot r/o AS. normal EF. No signs of CHF   Arrythmia 6.25 second pause on telemetry early morning 5/29.   Cardiology was consulted.  They feel it is likely due to OSA EKG shows 1st degree block Patient not beta blockers or rate control meds.  OSA  continue home CPAP  Will ask Pulm / RT to adjust CPAP settings to 21 cm of water given cardiac pause overnight.  patient wishes to go to SNF. SW consulted.  SNF Bed secured.  Code Status: full code  Family Communication:wife  at bedside  Disposition:  To SNF on 02/16/2013   Consultants:  Benson Norway / Collene Mares  [facility]  Procedures:  EGD/ Colonoscopy on 5/21 and 5/23  Capsule endoscopy on 5/23   HPI/Subjective: Patient reports no bowel movements overnight.  Wife tearful about cardiac pause last night  Objective: Filed Vitals:   02/14/13  2100 02/14/13 2330 02/15/13 0218 02/15/13 0500  BP: 147/68 114/68 121/52 106/49  Pulse: 73  69 63  Temp: 97.7 F (36.5 C)  97.5 F (36.4 C) 97.6 F (36.4 C)  TempSrc: Oral  Oral Oral  Resp: 18  18 20   Height:      Weight:      SpO2: 98%  94% 96%    Intake/Output Summary (Last 24 hours) at 02/15/13 1326 Last data filed at 02/15/13 1010  Gross per 24 hour  Intake    820 ml  Output   2200 ml  Net  -1380 ml   Filed Weights   02/10/13 0015 02/11/13 0500 02/11/13 1806  Weight: 178 kg (392 lb 6.7 oz) 164 kg (361 lb 8.9 oz) 164 kg (361 lb 8.9 oz)    Exam:  General: Elderly obese male in NAD, pleasant HEENT: pallor+, Moist mucosa  Cardiovascular: RRR, 3/6 systolic murmurs, rubs or gallop  Respiratory: clear b/l, short inspirations, no w/c/r Abdomen: soft, obese, NT, ND, BS+.  Musculoskeletal: warm, chronic venous stasis over b/l leg  CNS: AAOX3  CBC:  Recent Labs Lab 02/11/13 0236 02/11/13 0833 02/12/13 0540 02/13/13 1130 02/14/13 1030 02/15/13 0440  WBC 7.1 6.4 6.6 6.7  --  6.3  HGB 8.1* 8.4* 8.1* 8.6* 9.1* 8.0*  HCT 23.8* 24.5* 23.5* 25.5* 26.9* 24.7*  MCV 87.5 87.8 88.3 90.4  --  90.5  PLT 107* 113* 123* 139*  --  172   CBG:  Recent Labs Lab 02/10/13 0521  GLUCAP 93    Recent Results (from the past 240 hour(s))  MRSA PCR SCREENING     Status: None   Collection Time    02/05/13  9:53 PM      Result Value Range Status   MRSA by PCR NEGATIVE  NEGATIVE Final   Comment:            The GeneXpert MRSA Assay (FDA     approved for NASAL specimens     only), is one component of a     comprehensive MRSA colonization     surveillance program. It is not     intended to diagnose MRSA     infection nor to guide or     monitor treatment for     MRSA infections.     Studies: No results found.  Scheduled Meds: . cholecalciferol  1,000 Units Oral Daily  . furosemide  40 mg Oral Once  . gabapentin  200 mg Oral BID  . polyethylene glycol  17 g Oral BID  .  sodium chloride  3 mL Intravenous Q12H   Continuous Infusions:     Karen Kitchens  [facility] Pager [phone] If 7PM-7AM, please contact night-coverage at [email or web address], password [facility] 02/15/2013, 1:26 PM  LOS: 10 days

## 2013-02-15 NOTE — Care Management Note (Addendum)
    Page 1 of 1   02/16/2013     12:35:04 PM   CARE MANAGEMENT NOTE 02/16/2013  Patient:  [name], [name]   Account Number:  [id number]  Date Initiated:  02/08/2013  Documentation initiated by:  Felix Ahmadi Assessment:   72 yr-old male adm with dx of GI Bleed; lives with spouse     Action/Plan:   pt eval- rec snf.   Anticipated DC Date:  02/16/2013   Anticipated DC Plan:  SKILLED NURSING FACILITY  In-house referral  Clinical Social Worker      DC Planning Services  CM consult      Choice offered to / List presented to:             Status of service:  Completed, signed off Medicare Important Message given?   (If response is "NO", the following Medicare IM given date fields will be blank) Date Medicare IM given:   Date Additional Medicare IM given:    Discharge Disposition:  [place]  Per UR Regulation:  Reviewed for med. necessity/level of care/duration of stay  If discussed at [place] of Stay Meetings, dates discussed:    Comments:  PCP: Dr Kathryne Eriksson  02/16/13 12:34 Tomi Bamberger RN, BSN [phone] patient is for dc to snf today, CSW following.  02/15/13 14:42 Tomi Bamberger RN ,BSN [phone] patient lives with spouse, per physical therapy recs snf, patient for dc to snf tomorrow, patient had a 6 sec pause, has sleep apnea, will need cpap settings checked before dc.

## 2013-02-15 NOTE — Progress Notes (Signed)
NP Chaney Malling notified of previous notes.No new orders at present time .Will continue to monitor patient .

## 2013-02-15 NOTE — Progress Notes (Signed)
Clinical Social Work  CSW was informed that patient is not DC until 02/16/13. CSW informed [facility] and [facility] of delayed DC and both parties still agreeable to accept tomorrow. [facility] Medicare authorization [id number]. CSW will continue to follow to assist with DC plans.  Sindy Messing, LCSW (Coverage)

## 2013-02-15 NOTE — Progress Notes (Signed)
Addendum  Patient seen and examined, chart and data base reviewed.  I agree with the above assessment and plan.  For full details please see Mrs. Imogene Burn PA note.  Significant lower GI bleed, status post transfusion of 16 units of packed RBC, this is resolved.  Pause overnight 6.25 seconds, per cardiology likely secondary to sleep apnea.  Increase CPAP pressure to 21 cm/H2O. To watch patient overnight.   Birdie Hopes, MD [facility] Pager: [phone] 02/15/2013, 5:42 PM

## 2013-02-15 NOTE — Consult Note (Signed)
Cardiology Consult Note Penni Homans, MD No ref. provider found  Reason for consult: bradycardia  History of Present Illness (and review of medical records): [name] is a 72 y.o. male morbidly obese male with hx of valvular heart ds, AS, OSA and anemia admitted with BRBPR and anemia.  He has received 10units of PRBC since admission.  He has been evaluated by GI with colonscopy/EGD revealing multiple superficial ulcers in prox duodenum.  He has been in the hospital since 5/19 and is deconditioned requiring inpatient rehab.  Tonight, he was noted to have a 6 sec pause on telemetry.  He was asymptomatic and asleep at the time.  He has seen Dr. [name] in the past and LB was consulted for further evaluation and management.  He has had no chest pain or palpitations.  He is not currently on any rate controlling medications.  Review of Systems A comprehensive review of systems was negative other than stated in HPI. Patient Active Problem List   Diagnosis Date Noted  . AVM (arteriovenous malformation) of colon with hemorrhage 02/10/2013  . Thrombocytopenia, unspecified 02/09/2013  . Acute lower gastrointestinal bleeding 02/05/2013    Class: Acute  . Acute blood loss anemia 02/05/2013    Class: Acute  . EDEMA 04/15/2010  . HYPERCHOLESTEROLEMIA 04/14/2010  . GOUT 04/14/2010  . ANEMIA 04/14/2010  . AORTIC STENOSIS 04/14/2010  . VALVULAR HEART DISEASE 04/14/2010  . OSTEOARTHRITIS 04/14/2010  . ARTHRITIS 04/14/2010  . DEGENERATIVE DISC DISEASE 04/14/2010  . BURSITIS 04/14/2010  . OBESITY, MORBID 03/21/2010  . OBSTRUCTIVE SLEEP APNEA 02/19/2008   Past Medical History  Diagnosis Date  . Valvular heart disease   . Aortic stenosis   . Osteoarthritis   . Anemia   . OSA (obstructive sleep apnea)   . Bursitis   . Gout   . Arthritis   . Hyperlipidemia   . Morbid obesity   . DDD (degenerative disc disease)   . Mixed sensory-motor polyneuropathy   . Peroneal palsy     significant  right foot drop  . Hemorrhoids     hx of bleeding  . Hypertension     Past Surgical History  Procedure Laterality Date  . Total knee arthroplasty      right  . Spine surgery    . Laminotomy  1193    c6-t2  . Hernia repair      umbilical hernia  . Knee surgery Right     multiple knee surgeries due to complication of R TKA  . Colonoscopy N/A 02/07/2013    Procedure: COLONOSCOPY;  Surgeon: Juanita Craver, MD;  Location: [facility] ENDOSCOPY;  Service: Endoscopy;  Laterality: N/A;  . Esophagogastroduodenoscopy N/A 02/07/2013    Procedure: ESOPHAGOGASTRODUODENOSCOPY (EGD);  Surgeon: Juanita Craver, MD;  Location: [facility] ENDOSCOPY;  Service: Endoscopy;  Laterality: N/A;  . Colonoscopy N/A 02/09/2013    Procedure: COLONOSCOPY;  Surgeon: Beryle Beams, MD;  Location: [place];  Service: Endoscopy;  Laterality: N/A;    Prescriptions prior to admission  Medication Sig Dispense Refill  . atorvastatin (LIPITOR) 40 MG tablet Take 40 mg by mouth daily.        . cholecalciferol (VITAMIN D) 1000 UNITS tablet Take 1,000 Units by mouth daily.      [name] gabapentin (NEURONTIN) 100 MG capsule Take 200 mg by mouth 2 (two) times daily.      [name] lisinopril (PRINIVIL,ZESTRIL) 10 MG tablet Take 10 mg by mouth 2 (two) times daily.      . Multiple Vitamin (  MULTIVITAMIN WITH MINERALS) TABS Take 1 tablet by mouth daily.      . sennosides-docusate sodium (SENOKOT-S) 8.6-50 MG tablet Take 1 tablet by mouth 2 (two) times daily.      . [DISCONTINUED] aspirin EC 81 MG tablet Take 81 mg by mouth daily.      . [DISCONTINUED] diclofenac (VOLTAREN) 75 MG EC tablet Take 75 mg by mouth 2 (two) times daily.       No Known Allergies  History  Substance Use Topics  . Smoking status: Former Smoker    Types: Cigarettes, Pipe    Quit date: 09/20/2006  . Smokeless tobacco: Not on file  . Alcohol Use: No    Family History  Problem Relation Age of Onset  . Heart disease Mother   . Heart disease Father      Objective: Patient Vitals for  the past 8 hrs:  BP Temp Temp src Pulse Resp SpO2  02/15/13 0218 121/52 mmHg 97.5 F (36.4 C) Oral 69 18 94 %  02/14/13 2330 114/68 mmHg - - - - -  02/14/13 2100 147/68 mmHg 97.7 F (36.5 C) Oral 73 18 98 %   General Appearance:    Alert, cooperative, no distress, obese male, on CPAP  Head:    Normocephalic, without obvious abnormality,  Eyes:     Anicteric sclerae  Neck:   Supple,   Lungs:     Clear to auscultation bilaterally, respirations unlabored  Heart:    Regular rate and rhythm, S1 and S2 normal,   Abdomen:     Soft, non-tender, normoactive bowel sounds  Extremities:   Chronic appearing ulcers  Pulses:   2+ and symmetric all extremities  Skin:   no rashes or lesions  Neurologic:   No focal deficits. AAO x3   Results for orders placed during the hospital encounter of 02/05/13 (from the past 48 hour(s))  CBC     Status: Abnormal   Collection Time    02/13/13 11:30 AM      Result Value Range   WBC 6.7  4.0 - 10.5 K/uL   RBC 2.82 (*) 4.22 - 5.81 MIL/uL   Hemoglobin 8.6 (*) 13.0 - 17.0 g/dL   HCT 25.5 (*) 39.0 - 52.0 %   MCV 90.4  78.0 - 100.0 fL   MCH 30.5  26.0 - 34.0 pg   MCHC 33.7  30.0 - 36.0 g/dL   RDW 17.5 (*) 11.5 - 15.5 %   Platelets 139 (*) 150 - 400 K/uL  HEMOGLOBIN AND HEMATOCRIT, BLOOD     Status: Abnormal   Collection Time    02/14/13 10:30 AM      Result Value Range   Hemoglobin 9.1 (*) 13.0 - 17.0 g/dL   HCT 26.9 (*) 39.0 - 52.0 %   No results found.  ECG:  Sinus rhythm with sinus arrythmia, FAVB, no ecgs this admission until after episode  Telemetry strip revealed sinus pauses, longest 6secs @ 23:24  Impression: 72yr old morbidly obese male with hx of valvular heart ds, AS, OSA on CPAP and anemia admitted with BRBPR and severe anemia requiring 10 units of PRBCs found to have Second degree AVB with transient pause, asymtomatic while asleep.  Patient stable in sinus rhythm with FAVB.  No baseline ecg on admission.  Last back in 2011 which revealed  similar sinus rhythm with FAVB.  May have underlying conduction disease also affected by hypoxia from OSA and morbid obesity and in setting of current illness.  Recommendations:  No urgent interventions indicated at this time. Continue close monitoring on telemetry.  ECG prn for rhythm changes Hold any AV nodal blocking agents. Maintain scheduled CPAP QHS Discussed plan with [facility] and patient.  Thank you for this consult.  Will follow up in am with final recommendations.

## 2013-02-15 NOTE — Progress Notes (Signed)
Physical Therapy Treatment Patient Details Name: [name] MRN: [id number] DOB: Dec 14, 1940 Today's Date: 02/15/2013 Time: [id number] PT Time Calculation (min): 35 min  PT Assessment / Plan / Recommendation Comments on Treatment Session  pt anxious to try sit to stand and increase standing tolerance.  He did well at first, but easily tired.  He will need continued PT at SNF to decrease burden of care    Follow Up Recommendations  SNF     Does the patient have the potential to tolerate intense rehabilitation     Barriers to Discharge        Equipment Recommendations  None recommended by PT    Recommendations for Other Services    Frequency Min 3X/week   Plan Discharge plan remains appropriate;Frequency remains appropriate    Precautions / Restrictions Precautions Precautions: Fall   Pertinent Vitals/Pain Pt with c/o pain in wrist and legs with standing    Mobility  Bed Mobility Bed Mobility: Sit to Supine;Scooting to [facility];Supine to Sit Supine to Sit: 1: +2 Total assist Supine to Sit: Patient Percentage: 70% Sitting - Scoot to Edge of Bed: 4: Min assist Sit to Supine: 1: +2 Total assist Sit to Supine: Patient Percentage: 50% Scooting to [facility]: Patient Percentage: 30% Details for Bed Mobility Assistance: Bed in [place] and feet stabalized on bed in order for pt to push himself up to head of bed Pt also able to push on foot board to push up to top of bed (Assist to bring legs up and to rotate trunk around) Transfers Transfers: Sit to Stand;Stand to Sit Sit to Stand: 1: +2 Total assist;From elevated surface;With upper extremity assist;With armrests;From bed Sit to Stand: Patient Percentage: 60% Stand to Sit: 1: +2 Total assist;With upper extremity assist;To bed;To elevated surface Stand to Sit: Patient Percentage: 90% Details for Transfer Assistance: Pt needed assist to elevate bed for him to try to stand from .  He needed some assist at ischial tuberosities to stand  but he mostly wantst to do it himself.  He tried mutiple times and became progressively weaker with attempts as he tired.  He ultimaltely was unable to stand. Pt discouraged Ambulation/Gait Stairs: No Wheelchair Mobility Wheelchair Mobility: No    Exercises General Exercises - Lower Extremity Ankle Circles/Pumps: AAROM;Both;10 reps;Supine Quad Sets: AROM;Both;10 reps;Supine Gluteal Sets: AROM;Both;10 reps;Supine;Standing   PT Diagnosis:    PT Problem List:   PT Treatment Interventions:     PT Goals Acute Rehab PT Goals PT Goal: Supine/Side to Sit - Progress: Progressing toward goal PT Goal: Sit to Supine/Side - Progress: Progressing toward goal PT Goal: Sit to Stand - Progress: Progressing toward goal PT Goal: Stand to Sit - Progress: Progressing toward goal  Visit Information  Last PT Received On: 02/15/13 Assistance Needed: +2    Subjective Data  Subjective: I want to work on standing from the bed Patient Stated Goal: I want to walk so bad!   Cognition  Cognition Arousal/Alertness: Awake/alert Behavior During Therapy: WFL for tasks assessed/performed Overall Cognitive Status: Within Functional Limits for tasks assessed    Balance  Static Standing Balance Static Standing - Balance Support: Bilateral upper extremity supported Static Standing - Level of Assistance: 4: Min assist Static Standing - Comment/# of Minutes: Mulitple attempts.  On first attempt, it took a while for him to find his balance point and he was able to stand for 5 minutes.  On second attempt, he stood for 90 sec.[name]  He had several more attempts and ultimately  was unable to stand.  End of Session PT - End of Session Equipment Utilized During Treatment:  (shoes on feet and 2 knee supports on left knee) Activity Tolerance: Patient tolerated treatment well Patient left: in bed;with call bell/phone within reach;with family/visitor present   GP    Helene Kelp K. [place], [place] 02/15/2013, 4:27 PM

## 2013-02-15 NOTE — Progress Notes (Signed)
Order written last night to transfer to stepdown. Progress note written at [place] 5/29 states to continue monitoring on telemetry written by Dr. Colon Flattery. Will continue to monitor patient. Ranelle Oyster, RN

## 2013-02-15 NOTE — Telephone Encounter (Signed)
Error.[name] ° °

## 2013-02-15 NOTE — Progress Notes (Signed)
Patient placed himself on his home CPAP machine and is tolerating well.  Did not require any further assistance or supplies from RT.

## 2013-02-15 NOTE — Progress Notes (Signed)
Patient stated that his right hand was hurting. When nurse went into room patient had put [place] on hand and wrapped rt hand in ace wrap that was brought from home. Ranelle Oyster, RN

## 2013-02-15 NOTE — Progress Notes (Signed)
Clinical Social Work  Per MD, patient medically stable to DC. CSW called [employer] and left a message regarding the need to obtain authorization. CSW will continue to follow.  Sindy Messing, LCSW (Coverage)

## 2013-02-15 NOTE — Progress Notes (Signed)
Patient had 6.25second pause returning to second degree heart block type 2 remains asymptomatic .Will continue to monitor .

## 2013-02-16 DIAGNOSIS — G4733 Obstructive sleep apnea (adult) (pediatric): Secondary | ICD-10-CM

## 2013-02-16 LAB — BASIC METABOLIC PANEL
BUN: 12 mg/dL (ref 6–23)
CO2: 35 mEq/L — ABNORMAL HIGH (ref 19–32)
Calcium: 8.6 mg/dL (ref 8.4–10.5)
Chloride: 99 mEq/L (ref 96–112)
Creatinine, Ser: 0.83 mg/dL (ref 0.50–1.35)
GFR calc Af Amer: >90 mL/min (ref >90)
GFR calc non Af Amer: 87 mL/min — ABNORMAL LOW (ref >90)
Glucose, Bld: 106 mg/dL — ABNORMAL HIGH (ref 70–99)
Potassium: 4 mEq/L (ref 3.5–5.1)
Sodium: 140 mEq/L (ref 135–145)

## 2013-02-16 LAB — CBC
HCT: 26.3 % — ABNORMAL LOW (ref 39.0–52.0)
Hemoglobin: 8.6 g/dL — ABNORMAL LOW (ref 13.0–17.0)
MCH: 29.8 pg (ref 26.0–34.0)
MCHC: 32.7 g/dL (ref 30.0–36.0)
MCV: 91 fL (ref 78.0–100.0)
Platelets: 178 10*3/uL (ref 150–400)
RBC: 2.89 MIL/uL — ABNORMAL LOW (ref 4.22–5.81)
RDW: 17 % — ABNORMAL HIGH (ref 11.5–15.5)
WBC: 5.9 10*3/uL (ref 4.0–10.5)

## 2013-02-16 LAB — URIC ACID: Uric Acid, Serum: 6.8 mg/dL (ref 4.0–7.8)

## 2013-02-16 MED ORDER — LIDOCAINE 5 % EX PTCH: 1.0000 | MEDICATED_PATCH | Freq: Every day | CUTANEOUS | Status: DC | PRN

## 2013-02-16 MED ORDER — LIDOCAINE 5 % EX PTCH
1.0000 | MEDICATED_PATCH | CUTANEOUS | Status: DC
  Administered 2013-02-16: 1 via TRANSDERMAL
  Filled 2013-02-16 (×2): qty 1

## 2013-02-16 MED ORDER — DICLOFENAC SODIUM 1 % TD GEL: 1.0000 g | Freq: Two times a day (BID) | TRANSDERMAL | Status: DC | PRN

## 2013-02-16 MED ORDER — POLYETHYLENE GLYCOL 3350 17 G PO PACK: 17.0000 g | PACK | Freq: Two times a day (BID) | ORAL | Status: DC

## 2013-02-16 NOTE — Discharge Summary (Signed)
Physician Discharge Summary  [name] [id number] DOB: [birth year] DOA: 02/05/2013  PCP: Penni Homans, MD  Admit date: 02/05/2013 Discharge date: 02/16/2013  Time spent: 45 minutes  Recommendations for Outpatient Follow-up:  1. Check Hgb on Monday 6/2. Avoid all NSAIDS.  Recent life threatening GI Bleed. 2. Monitor Volume status 3. Monitor BP (lisinopril discontinued - may need to be restarted when he is stronger) 4. Follow up with Cardiology regarding arrythmia and aortic stenosis 5. CPAP settings increased to 20 cm/H2O.  Follow up with pulmonary/sleep MD. 6. Follow up with Dr. Benson Norway, Gastroenterology in 2 weeks. - sooner if hgb drops, or he has evidence of bleeding.   Discharge Diagnoses:  Principal Problem:   Acute lower gastrointestinal bleeding Active Problems:   OBESITY, MORBID   OBSTRUCTIVE SLEEP APNEA   AORTIC STENOSIS   VALVULAR HEART DISEASE   ARTHRITIS   Acute blood loss anemia   Thrombocytopenia, unspecified   AVM (arteriovenous malformation) of colon with hemorrhage   Discharge Condition: stable.  Diet recommendation: low salt heart healthy  Filed Weights   02/10/13 0015 02/11/13 0500 02/11/13 1806  Weight: 178 kg (392 lb 6.7 oz) 164 kg (361 lb 8.9 oz) 164 kg (361 lb 8.9 oz)    History of present illness:    Hospital Course:  Acute lower GI bleed  Patient received 16 units PRBC for lower gi bleed .  Colonoscopy done initially without source of bleeding  CT angiogram done on 5/22 negative any area of bleeding. Capsule endoscopy on 5/23 negative per GI. Repeat colonoscopy done on 5/23 showed cecal AVMs which were ablated.  Hold ASA and strictly avoid NSAIDs . Patient was taking dicofenac orally at home for knee pain.  No further bleeding for the past 48 hours. Hgb now stable at 8.6 Added iron supplements  appreciate GI and surgery consult.   Thrombocytopenia  Patient had drop in his platelets. Has not been on anticoagulation. HIT panel sent.  Given uncommon though pertinent side effects of thrombocytopenia with Lipitor and neurontin was held. platelets now improving.  Neurontin restarted.  Hypotension  BP stable. Resume home meds on discharge   Aortic stenosis  TEE from 2011 cannot r/o AS. normal EF. No signs of CHF  Dr. [name] recommends another TEE as outpatient.  Arrythmia  6.25 second pause on telemetry early morning 5/29.  Cardiology was consulted. They feel it is likely due to OSA  EKG shows 1st degree block.  Heart rate elevated (150s) with movement, but brady (30s) at rest. Patient not beta blockers or rate control meds. Rec follow up with Dr. Burt Knack, Cardiology  OSA  continue home CPAP  Settings adjusted to 20 cm/H2O as patient had a 6 second cardiac pause on 5/29. Recommend follow up with the Sleep MD or Pulmonologist for further evaluation.   Procedures: Colonoscopy on 5/21 and 5/23  Capsule endoscopy on 5/23 EGD on 5/21  Consultations:  Cardiology  Gastroenterology  Discharge Exam: Filed Vitals:   02/15/13 0500 02/15/13 1405 02/15/13 2012 02/16/13 0500  BP: 106/49 125/55 123/64 114/53  Pulse: 63 65 74 60  Temp: 97.6 F (36.4 C) 97.5 F (36.4 C) 98.6 F (37 C) 97.5 F (36.4 C)  TempSrc: Oral Oral  Oral  Resp: 20 20 20 20   Height:      Weight:      SpO2: 96% 97% 97% 96%   General: Elderly obese male in NAD, pleasant.   HEENT: Moist mucosa  Cardiovascular: RRR, 3/6 systolic murmur, rubs or  gallop  Respiratory: clear b/l, short inspirations, no w/c/r  Abdomen: soft, obese, NT, ND, BS+.  Musculoskeletal: warm, chronic venous stasis over b/l leg  CNS: AAOX3    Discharge Instructions      Discharge Orders   Future Appointments Provider Department Dept Phone   02/26/2013 10:15 AM Guido Sander, [place] [phone]   03/26/2013 1:30 PM [name], MD [place] at  [facility] [phone]   09/18/2013 1:30 PM Deneise Lever,  MD [facility] [phone]   Future Orders Complete By Expires     Diet - low sodium heart healthy  As directed     Increase activity slowly  As directed         Medication List    STOP taking these medications       aspirin 81 MG tablet     aspirin EC 81 MG tablet     diclofenac 75 MG EC tablet  Commonly known as:  VOLTAREN     MULTIVITAMIN PO      TAKE these medications       atorvastatin 40 MG tablet  Commonly known as:  LIPITOR  Take 40 mg by mouth daily.     cholecalciferol 1000 UNITS tablet  Commonly known as:  VITAMIN D  Take 1,000 Units by mouth daily.     diclofenac sodium 1 % Gel  Commonly known as:  VOLTAREN  Apply 2 g topically 2 (two) times daily as needed.     ferrous sulfate 325 (65 FE) MG tablet  Take 1 tablet (325 mg total) by mouth 2 (two) times daily.     gabapentin 100 MG capsule  Commonly known as:  NEURONTIN  Take 200 mg by mouth 2 (two) times daily.     lidocaine 5 %  Commonly known as:  LIDODERM  Place 1 patch onto the skin daily as needed (wrist pain). Remove & Discard patch within 12 hours or as directed by MD     lisinopril 10 MG tablet  Commonly known as:  PRINIVIL,ZESTRIL  Take 10 mg by mouth 2 (two) times daily.     multivitamin with minerals Tabs  Take 1 tablet by mouth daily.     polyethylene glycol packet  Commonly known as:  MIRALAX / GLYCOLAX  Take 17 g by mouth 2 (two) times daily.     sennosides-docusate sodium 8.6-50 MG tablet  Commonly known as:  SENOKOT-S  Take 1 tablet by mouth 2 (two) times daily.       No Known Allergies Follow-up Information   Follow up with Penni Homans, MD. Schedule an appointment as soon as possible for a visit in 1 week. (needs H&H checked)    Contact information:   [address] [place] [address] [phone]       Follow up with HUNG,PATRICK D, MD. Schedule an appointment as soon as possible for a visit in 2 weeks.   Contact information:   [address] [place] [address] [phone]       Follow up with Margarette Canada, RN. [facility] Clinic Coordinator - she will call with an appointment)    Contact information:   -TCTS [phone] -         Follow up with Sherren Mocha, MD. Schedule an appointment as soon as possible for a visit in 2 weeks.   Contact information:   [id number] [place] [address] [phone]  The results of significant diagnostics from this hospitalization (including imaging, microbiology, ancillary and laboratory) are listed below for reference.    Significant Diagnostic Studies: Ir Angiogram Visceral Selective  02/09/2013   *RADIOLOGY REPORT*  Clinical Data: Acute GI bleeding, source cannot be localized endoscopically.  The patient too large for tagged red blood cell scintigraphy.  SELECTIVE VISCERAL ARTERIOGRAPHY,IR ULTRASOUND GUIDANCE VASC ACCESS RIGHT  Comparison: None.  Approach:  Right common femoral artery  Vessels catheterized:  Superior mesenteric artery, inferior mesenteric artery, celiac axis  Technique and findings: The procedure, risks (including but not limited to bleeding, infection, organ damage), benefits, and alternatives were explained to the patient.  Questions regarding the procedure were encouraged and answered.  The patient understands and consents to the procedure.Right femoral region prepped and draped in usual sterile fashion. Maximal barrier sterile technique was utilized including caps, mask, sterile gowns, sterile gloves, sterile drape, hand hygiene and skin antiseptic.  Intravenous Fentanyl and Versed were administered as conscious sedation during continuous cardiorespiratory monitoring by the radiology RN, with a total moderate sedation time of 47 minutes.  Under real time ultrasound guidance, the right common femoral artery was accessed with 19 gauge percutaneous entry needle using single-wall technique in a single pass.  Needle exchanged over  [facility] wire for a 5-French vascular sheath, through which a 5- [place] C2 catheter was advanced in attempts to catheterize the superior mesenteric artery.  This was exchanged for a Sos catheter, and ultimately for a Simmons II catheter, for selective catheterization of the   superior mesenteric artery, inferior mesenteric artery, and celiac axis for selective arteriography of these distribution   in multiple projections.  There is no evidence of active extravasation.  No AVM or other arterial lesion is identified.  No significant atheromatous irregularity.  No aneurysm or stenosis.  The venous phase is unremarkable.  The catheter and sheath were removed and hemostasis achieved with the Exoseal device after confirmatory femoral arteriography. The patient tolerated the procedure well.  No immediate complication.  IMPRESSION:  No evidence of active extravasation or other focal arterial lesion.  The findings were reviewed with Dr. Grandville Silos post procedure.   Original Report Authenticated By: D. Wallace Going, MD   Ir Angiogram Visceral Selective  02/09/2013   *RADIOLOGY REPORT*  Clinical Data: Acute GI bleeding, source cannot be localized endoscopically.  The patient too large for tagged red blood cell scintigraphy.  SELECTIVE VISCERAL ARTERIOGRAPHY,IR ULTRASOUND GUIDANCE VASC ACCESS RIGHT  Comparison: None.  Approach:  Right common femoral artery  Vessels catheterized:  Superior mesenteric artery, inferior mesenteric artery, celiac axis  Technique and findings: The procedure, risks (including but not limited to bleeding, infection, organ damage), benefits, and alternatives were explained to the patient.  Questions regarding the procedure were encouraged and answered.  The patient understands and consents to the procedure.Right femoral region prepped and draped in usual sterile fashion. Maximal barrier sterile technique was utilized including caps, mask, sterile gowns, sterile gloves, sterile drape, hand hygiene and skin  antiseptic.  Intravenous Fentanyl and Versed were administered as conscious sedation during continuous cardiorespiratory monitoring by the radiology RN, with a total moderate sedation time of 47 minutes.  Under real time ultrasound guidance, the right common femoral artery was accessed with 19 gauge percutaneous entry needle using single-wall technique in a single pass.  Needle exchanged over [facility] wire for a 5-French vascular sheath, through which a 5- [place] C2 catheter was advanced in attempts to catheterize the superior mesenteric artery.  This was exchanged for  a Sos catheter, and ultimately for a Simmons II catheter, for selective catheterization of the   superior mesenteric artery, inferior mesenteric artery, and celiac axis for selective arteriography of these distribution   in multiple projections.  There is no evidence of active extravasation.  No AVM or other arterial lesion is identified.  No significant atheromatous irregularity.  No aneurysm or stenosis.  The venous phase is unremarkable.  The catheter and sheath were removed and hemostasis achieved with the Exoseal device after confirmatory femoral arteriography. The patient tolerated the procedure well.  No immediate complication.  IMPRESSION:  No evidence of active extravasation or other focal arterial lesion.  The findings were reviewed with Dr. Grandville Silos post procedure.   Original Report Authenticated By: D. Wallace Going, MD   Ir Angiogram Visceral Selective  02/09/2013   *RADIOLOGY REPORT*  Clinical Data: Acute GI bleeding, source cannot be localized endoscopically.  The patient too large for tagged red blood cell scintigraphy.  SELECTIVE VISCERAL ARTERIOGRAPHY,IR ULTRASOUND GUIDANCE VASC ACCESS RIGHT  Comparison: None.  Approach:  Right common femoral artery  Vessels catheterized:  Superior mesenteric artery, inferior mesenteric artery, celiac axis  Technique and findings: The procedure, risks (including but not limited to bleeding, infection,  organ damage), benefits, and alternatives were explained to the patient.  Questions regarding the procedure were encouraged and answered.  The patient understands and consents to the procedure.Right femoral region prepped and draped in usual sterile fashion. Maximal barrier sterile technique was utilized including caps, mask, sterile gowns, sterile gloves, sterile drape, hand hygiene and skin antiseptic.  Intravenous Fentanyl and Versed were administered as conscious sedation during continuous cardiorespiratory monitoring by the radiology RN, with a total moderate sedation time of 47 minutes.  Under real time ultrasound guidance, the right common femoral artery was accessed with 19 gauge percutaneous entry needle using single-wall technique in a single pass.  Needle exchanged over [facility] wire for a 5-French vascular sheath, through which a 5- [place] C2 catheter was advanced in attempts to catheterize the superior mesenteric artery.  This was exchanged for a Sos catheter, and ultimately for a Simmons II catheter, for selective catheterization of the   superior mesenteric artery, inferior mesenteric artery, and celiac axis for selective arteriography of these distribution   in multiple projections.  There is no evidence of active extravasation.  No AVM or other arterial lesion is identified.  No significant atheromatous irregularity.  No aneurysm or stenosis.  The venous phase is unremarkable.  The catheter and sheath were removed and hemostasis achieved with the Exoseal device after confirmatory femoral arteriography. The patient tolerated the procedure well.  No immediate complication.  IMPRESSION:  No evidence of active extravasation or other focal arterial lesion.  The findings were reviewed with Dr. Grandville Silos post procedure.   Original Report Authenticated By: D. Wallace Going, MD   Ir US Guide Vasc Access Right  02/09/2013   *RADIOLOGY REPORT*  Clinical Data: Acute GI bleeding, source cannot be localized  endoscopically.  The patient too large for tagged red blood cell scintigraphy.  SELECTIVE VISCERAL ARTERIOGRAPHY,IR ULTRASOUND GUIDANCE VASC ACCESS RIGHT  Comparison: None.  Approach:  Right common femoral artery  Vessels catheterized:  Superior mesenteric artery, inferior mesenteric artery, celiac axis  Technique and findings: The procedure, risks (including but not limited to bleeding, infection, organ damage), benefits, and alternatives were explained to the patient.  Questions regarding the procedure were encouraged and answered.  The patient understands and consents to the procedure.Right femoral region prepped and draped  in usual sterile fashion. Maximal barrier sterile technique was utilized including caps, mask, sterile gowns, sterile gloves, sterile drape, hand hygiene and skin antiseptic.  Intravenous Fentanyl and Versed were administered as conscious sedation during continuous cardiorespiratory monitoring by the radiology RN, with a total moderate sedation time of 47 minutes.  Under real time ultrasound guidance, the right common femoral artery was accessed with 19 gauge percutaneous entry needle using single-wall technique in a single pass.  Needle exchanged over [facility] wire for a 5-French vascular sheath, through which a 5- [place] C2 catheter was advanced in attempts to catheterize the superior mesenteric artery.  This was exchanged for a Sos catheter, and ultimately for a Simmons II catheter, for selective catheterization of the   superior mesenteric artery, inferior mesenteric artery, and celiac axis for selective arteriography of these distribution   in multiple projections.  There is no evidence of active extravasation.  No AVM or other arterial lesion is identified.  No significant atheromatous irregularity.  No aneurysm or stenosis.  The venous phase is unremarkable.  The catheter and sheath were removed and hemostasis achieved with the Exoseal device after confirmatory femoral arteriography. The  patient tolerated the procedure well.  No immediate complication.  IMPRESSION:  No evidence of active extravasation or other focal arterial lesion.  The findings were reviewed with Dr. Grandville Silos post procedure.   Original Report Authenticated By: D. Wallace Going, MD    Labs: Basic Metabolic Panel:  Recent Labs Lab 02/16/13 0515  NA 140  K 4.0  CL 99  CO2 35*  GLUCOSE 106*  BUN 12  CREATININE 0.83  CALCIUM 8.6   CBC:  Recent Labs Lab 02/11/13 0833 02/12/13 0540 02/13/13 1130 02/14/13 1030 02/15/13 0440 02/16/13 0515  WBC 6.4 6.6 6.7  --  6.3 5.9  HGB 8.4* 8.1* 8.6* 9.1* 8.0* 8.6*  HCT 24.5* 23.5* 25.5* 26.9* 24.7* 26.3*  MCV 87.8 88.3 90.4  --  90.5 91.0  PLT 113* 123* 139*  --  172 178   CBG:  Recent Labs Lab 02/10/13 0521  GLUCAP 93    Signed:  Melton Alar, Advanced Practice Provider  [facility] 02/16/2013, 10:35 AM

## 2013-02-16 NOTE — Discharge Summary (Signed)
Addendum  Patient seen and examined, chart and data base reviewed.  I agree with the above assessment and discharge plan.  For full details please see Mrs. Imogene Burn PA note.  Acute lower GI bleed, very significant with the status post transfusion of 16 units of packed RBCs.  Sleep apnea with bradyarrhythmia, patient had dose of 6.25 seconds 2 nights ago, per cardiology this is likely secondary to sleep apnea, has CPAP pressure increased to 20 cm of water   Birdie Hopes, MD [facility] Pager: [phone] 02/16/2013, 2:17 PM

## 2013-02-16 NOTE — Progress Notes (Signed)
Pt prepared for d/c to SNF. IV d/c'd. Skin intact except as most recently charted. Vitals are stable. Report called to receiving facility. Pt to be transported by ambulance service. 

## 2013-02-16 NOTE — Progress Notes (Signed)
Patient's HR has been irregular throughout the night dropping to the 30's at times nonsustained.  Patient has not had any pauses greater than 2 seconds. Patient's VS stable and patient asymptomatic. Will continue to monitor patient. Ranelle Oyster, RN

## 2013-02-18 ENCOUNTER — Encounter (HOSPITAL_COMMUNITY): Payer: Self-pay | Admitting: Gastroenterology

## 2013-02-18 NOTE — Clinical Social Work Placement (Addendum)
    Clinical Social Work Department CLINICAL SOCIAL WORK PLACEMENT NOTE 02/18/2013  Patient:  MARIEL, SWAYZER  Account Number:  [id number] Admit date:  02/05/2013  Clinical Social Worker:  Sindy Messing, LCSW  Date/time:  02/14/2013 02:00 PM  Clinical Social Work is seeking post-discharge placement for this patient at the following level of care:   [place]   (*CSW will update this form in Epic as items are completed)   02/14/2013  Patient/family provided with [place] Department of Clinical Social Work's list of facilities offering this level of care within the geographic area requested by the patient (or if unable, by the patient's family).  02/14/2013  Patient/family informed of their freedom to choose among providers that offer the needed level of care, that participate in Medicare, Medicaid or managed care program needed by the patient, have an available bed and are willing to accept the patient.  02/14/2013  Patient/family informed of [facility]' ownership interest in [facility], as well as of the fact that they are under no obligation to receive care at this facility.  PASARR submitted to EDS on 02/15/2013 PASARR number received from [employer] on 02/15/2013  FL2 transmitted to all facilities in geographic area requested by pt/family on  02/14/2013 FL2 transmitted to all facilities within larger geographic area on   Patient informed that his/her managed care company has contracts with or will negotiate with  certain facilities, including the following:     Patient/family informed of bed offers received:  02/14/2013 Patient chooses bed at [facility] Physician recommends and patient chooses bed at    Patient to be transferred to [place] on  02/16/2013 Patient to be transferred to facility by [facility]  The following physician request were entered in Epic:   Additional Comments: [facility] to SNF 02/16/13. Patient and wife are agreable to d/c. Notified  SNF and pt's nurse of d/c plan.  No further CSW needs identified.  CSW signing off.  Lorie Phenix. [place], [place]

## 2013-02-20 ENCOUNTER — Ambulatory Visit (INDEPENDENT_AMBULATORY_CARE_PROVIDER_SITE_OTHER): Payer: Medicare Other | Admitting: General Surgery

## 2013-02-26 ENCOUNTER — Ambulatory Visit: Payer: Medicare Other | Admitting: Physical Therapy

## 2013-03-26 ENCOUNTER — Encounter: Payer: Self-pay | Admitting: Family Medicine

## 2013-03-26 ENCOUNTER — Other Ambulatory Visit: Payer: Self-pay | Admitting: Family Medicine

## 2013-03-26 ENCOUNTER — Ambulatory Visit (INDEPENDENT_AMBULATORY_CARE_PROVIDER_SITE_OTHER): Payer: Medicare Other | Admitting: Family Medicine

## 2013-03-26 VITALS — BP 104/56 | HR 87 | Temp 98.2°F | Ht 74.0 in

## 2013-03-26 DIAGNOSIS — IMO0001 Reserved for inherently not codable concepts without codable children: Secondary | ICD-10-CM

## 2013-03-26 DIAGNOSIS — G4733 Obstructive sleep apnea (adult) (pediatric): Secondary | ICD-10-CM

## 2013-03-26 DIAGNOSIS — E7800 Pure hypercholesterolemia, unspecified: Secondary | ICD-10-CM

## 2013-03-26 DIAGNOSIS — R030 Elevated blood-pressure reading, without diagnosis of hypertension: Secondary | ICD-10-CM

## 2013-03-26 DIAGNOSIS — E669 Obesity, unspecified: Secondary | ICD-10-CM

## 2013-03-26 DIAGNOSIS — M109 Gout, unspecified: Secondary | ICD-10-CM

## 2013-03-26 DIAGNOSIS — K219 Gastro-esophageal reflux disease without esophagitis: Secondary | ICD-10-CM

## 2013-03-26 DIAGNOSIS — D649 Anemia, unspecified: Secondary | ICD-10-CM

## 2013-03-26 DIAGNOSIS — I359 Nonrheumatic aortic valve disorder, unspecified: Secondary | ICD-10-CM

## 2013-03-26 DIAGNOSIS — R52 Pain, unspecified: Secondary | ICD-10-CM

## 2013-03-26 DIAGNOSIS — K922 Gastrointestinal hemorrhage, unspecified: Secondary | ICD-10-CM

## 2013-03-26 DIAGNOSIS — R062 Wheezing: Secondary | ICD-10-CM

## 2013-03-26 LAB — HEPATIC FUNCTION PANEL
ALT: 16 U/L (ref 0–53)
AST: 18 U/L (ref 0–37)
Albumin: 3.4 g/dL — ABNORMAL LOW (ref 3.5–5.2)
Alkaline Phosphatase: 66 U/L (ref 39–117)
Bilirubin, Direct: 0.1 mg/dL (ref 0.0–0.3)
Indirect Bilirubin: 0.3 mg/dL (ref 0.0–0.9)
Total Bilirubin: 0.4 mg/dL (ref 0.3–1.2)
Total Protein: 5.8 g/dL — ABNORMAL LOW (ref 6.0–8.3)

## 2013-03-26 LAB — RETICULOCYTES
ABS Retic: 80.3 10*3/uL (ref 19.0–186.0)
RBC.: 3.49 MIL/uL — ABNORMAL LOW (ref 4.22–5.81)
Retic Ct Pct: 2.3 % (ref 0.4–2.3)

## 2013-03-26 LAB — RENAL FUNCTION PANEL
Albumin: 3.4 g/dL — ABNORMAL LOW (ref 3.5–5.2)
BUN: 21 mg/dL (ref 6–23)
CO2: 28 mEq/L (ref 19–32)
Calcium: 9.4 mg/dL (ref 8.4–10.5)
Chloride: 101 mEq/L (ref 96–112)
Creat: 0.96 mg/dL (ref 0.50–1.35)
Glucose, Bld: 83 mg/dL (ref 70–99)
Phosphorus: 4.2 mg/dL (ref 2.3–4.6)
Potassium: 5.2 mEq/L (ref 3.5–5.3)
Sodium: 136 mEq/L (ref 135–145)

## 2013-03-26 LAB — CBC
HCT: 29.8 % — ABNORMAL LOW (ref 39.0–52.0)
Hemoglobin: 9.4 g/dL — ABNORMAL LOW (ref 13.0–17.0)
MCH: 26.9 pg (ref 26.0–34.0)
MCHC: 31.5 g/dL (ref 30.0–36.0)
MCV: 85.4 fL (ref 78.0–100.0)
Platelets: 265 10*3/uL (ref 150–400)
RBC: 3.49 MIL/uL — ABNORMAL LOW (ref 4.22–5.81)
RDW: 17.6 % — ABNORMAL HIGH (ref 11.5–15.5)
WBC: 10 10*3/uL (ref 4.0–10.5)

## 2013-03-26 MED ORDER — OMEPRAZOLE 20 MG PO CPDR: 20.0000 mg | DELAYED_RELEASE_CAPSULE | Freq: Every day | ORAL | Status: DC

## 2013-03-26 MED ORDER — ALBUTEROL SULFATE HFA 108 (90 BASE) MCG/ACT IN AERS: 2.0000 | INHALATION_SPRAY | Freq: Four times a day (QID) | RESPIRATORY_TRACT | Status: DC | PRN

## 2013-03-26 MED ORDER — GABAPENTIN 300 MG PO CAPS: 300.0000 mg | ORAL_CAPSULE | Freq: Three times a day (TID) | ORAL | Status: DC

## 2013-03-26 NOTE — Assessment & Plan Note (Signed)
Will obtain old records and recheck lipids

## 2013-03-26 NOTE — Assessment & Plan Note (Signed)
Was hospitalized in May and then spent a long course in rehab before returning home last week. Recheck cbc today and continue iron for now.

## 2013-03-26 NOTE — Assessment & Plan Note (Signed)
Agrees to return to see cardiology for further consideration

## 2013-03-26 NOTE — Assessment & Plan Note (Signed)
Recently hospitalized for an acute GI bleed, repeat CBC today. No recent episodes of GI bleeds. Has stopped Diclofenac

## 2013-03-26 NOTE — Assessment & Plan Note (Signed)
Worsening wrist pain b/l check uric acid level today, try ice and Sal[place] cream prn

## 2013-03-26 NOTE — Patient Instructions (Addendum)
[facility] release of records  Try Salon Pas cream or Aspercreme for arthritis pain  Anemia, Frequently Asked Questions WHAT ARE THE SYMPTOMS OF ANEMIA?  Headache.  Difficulty thinking.  Fatigue.  Shortness of breath.  Weakness.  Rapid heartbeat. AT WHAT POINT ARE PEOPLE CONSIDERED ANEMIC?  This varies with gender and age.   Both hemoglobin (Hgb) and hematocrit values are used to define anemia. These lab values are obtained from a complete blood count (CBC) test. This is performed at a caregiver's office.  The normal range of hemoglobin values for adult men is 14.0 g/dL to 17.4 g/dL. For nonpregnant women, values are 12.3 g/dL to 15.3 g/dL.  The World Health Organization defines anemia as less than 12 g/dL for nonpregnant women and less than 13 g/dL for men.  For adult males, the average normal hematocrit is 46%, and the range is 40% to 52%.  For adult females, the average normal hematocrit is 41%, and the range is 35% to 47%.  Values that fall below the lower limits can be a sign of anemia and should have further checking (evaluation). GROUPS OF PEOPLE WHO ARE AT RISK FOR DEVELOPING ANEMIA INCLUDE:   Infants who are breastfed or taking a formula that is not fortified with iron.  Children going through a rapid growth spurt. The iron available can not keep up with the needs for a red cell mass which must grow with the child.  Women in childbearing years. They need iron because of blood loss during menstruation.  Pregnant women. The growing fetus creates a high demand for iron.  People with ongoing gastrointestinal blood loss are at risk of developing iron deficiency.  Individuals with leukemia or cancer who must receive chemotherapy or radiation to treat their disease. The drugs or radiation used to treat these diseases often decreases the bone marrow's ability to make cells of all classes. This includes red blood cells, white blood cells, and  platelets.  Individuals with chronic inflammatory conditions such as rheumatoid arthritis or chronic infections.  The elderly. ARE SOME TYPES OF ANEMIA INHERITED?   Yes, some types of anemia are due to inherited or genetic defects.  Sickle cell anemia. This occurs most often in people of African, African American, and Mediterranean descent.  Thalassemia (or Cooley's anemia). This type is found in people of [place] and [place] descent. These types of anemia are common.  [place]. This is rare. CAN CERTAIN MEDICATIONS CAUSE A PERSON TO BECOME ANEMIC?  Yes. For example, drugs to fight cancer (chemotherapeutic agents) often cause anemia. These drugs can slow the bone marrow's ability to make red blood cells. If there are not enough red blood cells, the body does not get enough oxygen. WHAT HEMATOCRIT LEVEL IS REQUIRED TO DONATE BLOOD?  The lower limit of an acceptable hematocrit for blood donors is 38%. If you have a low hematocrit value, you should schedule an appointment with your caregiver. ARE BLOOD TRANSFUSIONS COMMONLY USED TO CORRECT ANEMIA, AND ARE THEY DANGEROUS?  They are used to treat anemia as a last resort. Your caregiver will find the cause of the anemia and correct it if possible. Most blood transfusions are given because of excessive bleeding at the time of surgery, with trauma, or because of bone marrow suppression in patients with cancer or leukemia on chemotherapy. Blood transfusions are safer than ever before. We also know that blood transfusions affect the immune system and may increase certain risks. There is also a concern for human error. In  1/16,000 transfusions, a patient receives a transfusion of blood that is not matched with his or her blood type.  WHAT IS IRON DEFICIENCY ANEMIA AND CAN I CORRECT IT BY CHANGING MY DIET?  Iron is an essential part of hemoglobin. Without enough hemoglobin, anemia develops and the body does not get the right amount of oxygen.  Iron deficiency anemia develops after the body has had a low level of iron for a long time. This is either caused by blood loss, not taking in or absorbing enough iron, or increased demands for iron (like pregnancy or rapid growth).  Foods from animal origin such as beef, chicken, and pork, are good sources of iron. Be sure to have one of these foods at each meal. Vitamin C helps your body absorb iron. Foods rich in Vitamin C include citrus, bell pepper, strawberries, spinach and cantaloupe. In some cases, iron supplements may be needed in order to correct the iron deficiency. In the case of poor absorption, extra iron may have to be given directly into the vein through a needle (intravenously). I HAVE BEEN DIAGNOSED WITH IRON DEFICIENCY ANEMIA AND MY CAREGIVER PRESCRIBED IRON SUPPLEMENTS. HOW LONG WILL IT TAKE FOR MY BLOOD TO BECOME NORMAL?  It depends on the degree of anemia at the beginning of treatment. Most people with mild to moderate iron deficiency, anemia will correct the anemia over a period of 2 to 3 months. But after the anemia is corrected, the iron stored by the body is still low. Caregivers often suggest an additional 6 months of oral iron therapy once the anemia has been reversed. This will help prevent the iron deficiency anemia from quickly happening again. Non-anemic adult males should take iron supplements only under the direction of a doctor, too much iron can cause liver damage.  MY HEMOGLOBIN IS 9 G/DL AND I AM SCHEDULED FOR SURGERY. SHOULD I POSTPONE THE SURGERY?  If you have Hgb of 9, you should discuss this with your caregiver right away. Many patients with similar hemoglobin levels have had surgery without problems. If minimal blood loss is expected for a minor procedure, no treatment may be necessary.  If a greater blood loss is expected for more extensive procedures, you should ask your caregiver about being treated with erythropoietin and iron. This is to accelerate the recovery  of your hemoglobin to a normal level before surgery. An anemic patient who undergoes high-blood-loss surgery has a greater risk of surgical complications and need for a blood transfusion, which also carries some risk.  I HAVE BEEN TOLD THAT HEAVY MENSTRUAL PERIODS CAUSE ANEMIA. IS THERE ANYTHING I CAN DO TO PREVENT THE ANEMIA?  Anemia that results from heavy periods is usually due to iron deficiency. You can try to meet the increased demands for iron caused by the heavy monthly blood loss by increasing the intake of iron-rich foods. Iron supplements may be required. Discuss your concerns with your caregiver. WHAT CAUSES ANEMIA DURING PREGNANCY?  Pregnancy places major demands on the body. The mother must meet the needs of both her body and her growing baby. The body needs enough iron and folate to make the right amount of red blood cells. To prevent anemia while pregnant, the mother should stay in close contact with her caregiver.  Be sure to eat a diet that has foods rich in iron and folate like liver and dark green leafy vegetables. Folate plays an important role in the normal development of a baby's spinal cord. Folate can help prevent serious  disorders like spina bifida. If your diet does not provide adequate nutrients, you may want to talk with your caregiver about nutritional supplements.  WHAT IS THE RELATIONSHIP BETWEEN FIBROID TUMORS AND ANEMIA IN WOMEN?  The relationship is usually caused by the increased menstrual blood loss caused by fibroids. Good iron intake may be required to prevent iron deficiency anemia from developing.  Document Released: 04/14/2004 Document Revised: 11/29/2011 Document Reviewed: 09/29/2010 [facility] Patient Information 2014 [place], [place].

## 2013-03-26 NOTE — Progress Notes (Signed)
Patient ID: [name], male   DOB: [birth year], 72 y.o.   MRN: [id number] ANCEL SHONE [id number] Aug 07, 1941 03/26/2013      Progress Note-Follow Up  Subjective  Chief Complaint  Chief Complaint  Patient presents with  . Establish Care    new patient  . patient was in hospital for GI bleeding in May    HPI  Patient is a 72 year old Caucasian male who is in today to establish care. He has a complicated past medical history this past May was hospitalized for several days with an acute GI bleed. He was taken off diclofenac at bedtime and has not had recurrence since he only returned home just a week ago after a long stay in rehabilitation for debility. He had no further GI bleeds and denies any other acute complaints. No recent illness or fevers. No chest pain or palpitations. No shortness of breath or GU complaints  Past Medical History  Diagnosis Date  . Valvular heart disease   . Aortic stenosis   . Osteoarthritis   . Anemia   . OSA (obstructive sleep apnea)   . Bursitis   . Gout   . Arthritis   . Hyperlipidemia   . Morbid obesity   . DDD (degenerative disc disease)   . Mixed sensory-motor polyneuropathy   . Peroneal palsy     significant right foot drop  . Hemorrhoids     hx of bleeding  . Hypertension   . History of shingles   . Measles as a child  . Chicken pox as a child    Past Surgical History  Procedure Laterality Date  . Total knee arthroplasty      right  . Spine surgery    . Laminotomy  1193    c6-t2  . Hernia repair      umbilical hernia  . Knee surgery Right     multiple knee surgeries due to complication of R TKA  . Colonoscopy N/A 02/07/2013    Procedure: COLONOSCOPY;  Surgeon: Juanita Craver, MD;  Location[facility] ENDOSCOPY;  Service: Endoscopy;  Laterality: N/A;  . Esophagogastroduodenoscopy N/A 02/07/2013    Procedure: ESOPHAGOGASTRODUODENOSCOPY (EGD);  Surgeon: Juanita Craver, MD;  Location: [facility] ENDOSCOPY;  Service: Endoscopy;  Laterality: N/A;  .  Colonoscopy N/A 02/09/2013    Procedure: COLONOSCOPY;  Surgeon: Beryle Beams, MD;  Location: [facility];  Service: Endoscopy;  Laterality: N/A;  . Givens capsule study N/A 02/09/2013    Procedure: GIVENS CAPSULE STUDY;  Surgeon: Beryle Beams, MD;  Location: [facility];  Service: Endoscopy;  Laterality: N/A;    Family History  Problem Relation Age of Onset  . Arthritis Sister     "crippling"  . Arthritis Sister     History   Social History  . Marital Status: Married    Spouse Name: N/A    Number of Children: N/A  . Years of Education: N/A   Occupational History  . retired Administrator    Social History Main Topics  . Smoking status: Former Smoker    Types: Cigarettes, Pipe    Quit date: 09/20/2006  . Smokeless tobacco: Not on file  . Alcohol Use: No  . Drug Use: No  . Sexually Active: Not on file   Other Topics Concern  . Not on file   Social History Narrative  . No narrative on file    Current Outpatient Prescriptions on File Prior to Visit  Medication Sig Dispense Refill  . atorvastatin (LIPITOR) 40  MG tablet Take 40 mg by mouth daily.        . cholecalciferol (VITAMIN D) 1000 UNITS tablet Take 2,000 Units by mouth daily.       . ferrous sulfate 325 (65 FE) MG tablet Take 1 tablet (325 mg total) by mouth 2 (two) times daily.  60 tablet  0  . gabapentin (NEURONTIN) 100 MG capsule Take 200 mg by mouth 2 (two) times daily.      [name] lidocaine (LIDODERM) 5 % Place 1 patch onto the skin daily as needed (wrist pain). Remove & Discard patch within 12 hours or as directed by MD  30 patch  0  . lisinopril (PRINIVIL,ZESTRIL) 10 MG tablet Take 10 mg by mouth 2 (two) times daily.      . Multiple Vitamin (MULTIVITAMIN WITH MINERALS) TABS Take 1 tablet by mouth daily.      . polyethylene glycol (MIRALAX / GLYCOLAX) packet Take 17 g by mouth 2 (two) times daily.  14 each  0  . sennosides-docusate sodium (SENOKOT-S) 8.6-50 MG tablet Take 1 tablet by mouth 2 (two) times daily.        No current facility-administered medications on file prior to visit.    No Known Allergies  Review of Systems  Review of Systems  Constitutional: Negative for fever, chills and malaise/fatigue.  HENT: Negative for hearing loss, nosebleeds and congestion.   Eyes: Negative for pain and discharge.  Respiratory: Negative for cough, sputum production, shortness of breath and wheezing.   Cardiovascular: Negative for chest pain, palpitations and leg swelling.  Gastrointestinal: Negative for heartburn, nausea, vomiting, abdominal pain, diarrhea, constipation and blood in stool.  Genitourinary: Negative for dysuria, urgency, frequency and hematuria.  Musculoskeletal: Negative for myalgias, back pain and falls.  Skin: Negative for rash.  Neurological: Negative for dizziness, tremors, sensory change, focal weakness, loss of consciousness, weakness and headaches.  Endo/Heme/Allergies: Negative for polydipsia. Does not bruise/bleed easily.  Psychiatric/Behavioral: Negative for depression and suicidal ideas. The patient is not nervous/anxious and does not have insomnia.     Objective  BP 104/56  Pulse 87  Temp(Src) 98.2 F (36.8 C) (Oral)  Ht 6\' 2"  (1.88 m)  SpO2 96%  Physical Exam  Physical Exam  Constitutional: He is oriented to person, place, and time and well-developed, well-nourished, and in no distress. No distress.  HENT:  Head: Normocephalic and atraumatic.  Eyes: Conjunctivae are normal.  Neck: Neck supple. No thyromegaly present.  Cardiovascular: Normal rate, regular rhythm and normal heart sounds.  Exam reveals no gallop.   No murmur heard. Pulmonary/Chest: Effort normal and breath sounds normal. No respiratory distress.  Abdominal: He exhibits no distension and no mass. There is no tenderness.  Musculoskeletal: He exhibits no edema.  Neurological: He is alert and oriented to person, place, and time.  Skin: Skin is warm.  Psychiatric: Memory, affect and judgment  normal.    No results found for this basename: TSH   Lab Results  Component Value Date   WBC 5.9 02/16/2013   HGB 8.6* 02/16/2013   HCT 26.3* 02/16/2013   MCV 91.0 02/16/2013   PLT 178 02/16/2013   Lab Results  Component Value Date   CREATININE 0.83 02/16/2013   BUN 12 02/16/2013   NA 140 02/16/2013   K 4.0 02/16/2013   CL 99 02/16/2013   CO2 35* 02/16/2013   Lab Results  Component Value Date   ALT 16 02/07/2013   AST 19 02/07/2013   ALKPHOS 43 02/07/2013  BILITOT 0.5 02/07/2013     Assessment & Plan  ANEMIA Recently hospitalized for an acute GI bleed, repeat CBC today. No recent episodes of GI bleeds. Has stopped Diclofenac  Acute lower gastrointestinal bleeding Was hospitalized in May and then spent a long course in rehab before returning home last week. Recheck cbc today and continue iron for now.  OBSTRUCTIVE SLEEP APNEA Using cpap routinely  AORTIC STENOSIS Agrees to return to see cardiology for further consideration  GOUT Worsening wrist pain b/l check uric acid level today, try ice and Salon Pas cream prn  HYPERCHOLESTEROLEMIA Will obtain old records and recheck lipids

## 2013-03-26 NOTE — Assessment & Plan Note (Signed)
Using cpap routinely

## 2013-03-29 ENCOUNTER — Telehealth: Payer: Self-pay | Admitting: Family Medicine

## 2013-03-29 DIAGNOSIS — M109 Gout, unspecified: Secondary | ICD-10-CM

## 2013-03-29 LAB — URIC ACID: Uric Acid, Serum: 8 mg/dL — ABNORMAL HIGH (ref 4.0–7.8)

## 2013-03-29 NOTE — Telephone Encounter (Signed)
Patients wife [name] called in requesting results from last labs regarding gout?

## 2013-03-29 NOTE — Telephone Encounter (Signed)
Called [facility] RE: uric acid lab result, order was placed after original lab orders; placed add-on order & informed patient that we will call when results are in system by tomorrow/SLS

## 2013-03-30 NOTE — Telephone Encounter (Signed)
Pt informed Uric results not in system; we will call on all Mon, 07.14.14 when provider returns to office/SLS

## 2013-04-02 MED ORDER — ALLOPURINOL 100 MG PO TABS: 100.0000 mg | ORAL_TABLET | Freq: Every day | ORAL | Status: DC

## 2013-04-02 NOTE — Telephone Encounter (Signed)
Patient informed, understood & agreed; Rx to pharmacy, future lab order placed/SLS

## 2013-04-02 NOTE — Telephone Encounter (Signed)
Printed results from [facility] forwarded to provider for patient instructions/SLS

## 2013-04-23 ENCOUNTER — Encounter: Payer: Self-pay | Admitting: Family Medicine

## 2013-04-23 ENCOUNTER — Ambulatory Visit (INDEPENDENT_AMBULATORY_CARE_PROVIDER_SITE_OTHER): Payer: BC Managed Care – HMO | Admitting: Family Medicine

## 2013-04-23 VITALS — BP 108/62 | HR 67 | Temp 97.9°F | Ht 74.0 in

## 2013-04-23 DIAGNOSIS — G4733 Obstructive sleep apnea (adult) (pediatric): Secondary | ICD-10-CM

## 2013-04-23 DIAGNOSIS — R609 Edema, unspecified: Secondary | ICD-10-CM

## 2013-04-23 DIAGNOSIS — M109 Gout, unspecified: Secondary | ICD-10-CM

## 2013-04-23 DIAGNOSIS — D649 Anemia, unspecified: Secondary | ICD-10-CM

## 2013-04-23 LAB — CBC
HCT: 33.1 % — ABNORMAL LOW (ref 39.0–52.0)
Hemoglobin: 10.8 g/dL — ABNORMAL LOW (ref 13.0–17.0)
MCH: 27.9 pg (ref 26.0–34.0)
MCHC: 32.6 g/dL (ref 30.0–36.0)
MCV: 85.5 fL (ref 78.0–100.0)
Platelets: 218 10*3/uL (ref 150–400)
RBC: 3.87 MIL/uL — ABNORMAL LOW (ref 4.22–5.81)
RDW: 18 % — ABNORMAL HIGH (ref 11.5–15.5)
WBC: 9.1 10*3/uL (ref 4.0–10.5)

## 2013-04-23 NOTE — Patient Instructions (Addendum)
Labs prior to visit, tsh, cbc, renal, hepatic. lipid  Peripheral Neuropathy Peripheral neuropathy is a common disorder of your nerves resulting from damage. CAUSES  This disorder may be caused by a disease of the nerves or illness. Many neuropathies have well known causes such as:  Diabetes. This is one of the most common causes.   Uremia.   AIDS.   Nutritional deficiencies.   Other causes include mechanical pressures. These may be from:   Compression.   Injury.   Contusions or bruises.   Fracture or dislocated bones.   Pressure involving the nerves close to the surface. Nerves such as the ulnar, or radial can be injured by prolonged use of crutches.  Other injuries may come from:  Tumor.   Hemorrhage or bleeding into a nerve.   Exposure to cold or radiation.   Certain medicines or toxic substances (rare).   Vascular or collagen disorders such as:   Atherosclerosis.   Systemic lupus erythematosus.   Scleroderma.   Sarcoidosis.   Rheumatoid arthritis.   Polyarteritis nodosa.   A large number of cases are of unknown cause.  SYMPTOMS  Common problems include:  Weakness.   Numbness.   Abnormal sensations (paresthesia) such as:   Burning.   Tickling.   Pricking.   Tingling.   Pain in the arms, hands, legs and/or feet.  TREATMENT  Therapy for this disorder differs depending on the cause. It may vary from medical treatment with medications or physical therapy among others.   For example, therapy for this disorder caused by diabetes involves control of the diabetes.   In cases where a tumor or ruptured disc is the cause, therapy may involve surgery. This would be to remove the tumor or to repair the ruptured disc.   In entrapment or compression neuropathy, treatment may consist of splinting or surgical decompression of the ulnar or median nerves. A common example of entrapment neuropathy is carpal tunnel syndrome. This has become more common because  of the increasing use of computers.   Peroneal and radial compression neuropathies may require avoidance of pressure.   Physical therapy and/or splints may be useful in preventing contractures. This is a condition in which shortened muscles around joints cause abnormal and sometimes painful positioning of the joints.  Document Released: 08/27/2002 Document Revised: 05/19/2011 Document Reviewed: 09/06/2005 [facility] Patient Information 2012 [place].

## 2013-04-25 ENCOUNTER — Other Ambulatory Visit: Payer: Self-pay

## 2013-04-25 ENCOUNTER — Encounter: Payer: Self-pay | Admitting: Family Medicine

## 2013-04-25 NOTE — Assessment & Plan Note (Signed)
Burning pain in wrists is better as gout improves, continue to hydrate well, continue Allopurinol

## 2013-04-25 NOTE — Assessment & Plan Note (Signed)
Using CPAP routinely 

## 2013-04-25 NOTE — Assessment & Plan Note (Signed)
Improving, has stopped his iron in last week due to constipation. Try to increase iron in diet

## 2013-04-25 NOTE — Assessment & Plan Note (Signed)
Improved some maintain DASH diet.. Stay active as tolerated and elevate feet as able

## 2013-04-25 NOTE — Progress Notes (Signed)
Patient ID: [name], male   DOB: 03-Jan-1941, 72 y.o.   MRN: [id number] CAROS MERGEL [id number] 1940-12-23 04/25/2013      Progress Note-Follow Up  Subjective  Chief Complaint  Chief Complaint  Patient presents with  . Follow-up    1 month    HPI  Patient is a 72 year old male who is in today for followup. He is this burning is improving with treatment of his gout. He does still have weakness and pain in his wrists however that is limiting his ability to increase his mobility. He denies any other acute complaints. Denies any chest pain, palpitations, shortness of breath, GI or GU concerns.  Past Medical History  Diagnosis Date  . Valvular heart disease   . Aortic stenosis   . Osteoarthritis   . Anemia   . OSA (obstructive sleep apnea)   . Bursitis   . Gout   . Arthritis   . Hyperlipidemia   . Morbid obesity   . DDD (degenerative disc disease)   . Mixed sensory-motor polyneuropathy   . Peroneal palsy     significant right foot drop  . Hemorrhoids     hx of bleeding  . Hypertension   . History of shingles   . Measles as a child  . Chicken pox as a child    Past Surgical History  Procedure Laterality Date  . Total knee arthroplasty      right  . Spine surgery    . Laminotomy  1193    c6-t2  . Hernia repair      umbilical hernia  . Knee surgery Right     multiple knee surgeries due to complication of R TKA  . Colonoscopy N/A 02/07/2013    Procedure: COLONOSCOPY;  Surgeon: Juanita Craver, MD;  Location: [facility] ENDOSCOPY;  Service: Endoscopy;  Laterality: N/A;  . Esophagogastroduodenoscopy N/A 02/07/2013    Procedure: ESOPHAGOGASTRODUODENOSCOPY (EGD);  Surgeon: Juanita Craver, MD;  Location: [facility] ENDOSCOPY;  Service: Endoscopy;  Laterality: N/A;  . Colonoscopy N/A 02/09/2013    Procedure: COLONOSCOPY;  Surgeon: Beryle Beams, MD;  Location: [place];  Service: Endoscopy;  Laterality: N/A;  . Givens capsule study N/A 02/09/2013    Procedure: GIVENS CAPSULE STUDY;   Surgeon: Beryle Beams, MD;  Location: [place];  Service: Endoscopy;  Laterality: N/A;    Family History  Problem Relation Age of Onset  . Arthritis Sister     "crippling"  . Arthritis Sister     History   Social History  . Marital Status: Married    Spouse Name: N/A    Number of Children: N/A  . Years of Education: N/A   Occupational History  . retired Administrator    Social History Main Topics  . Smoking status: Former Smoker    Types: Cigarettes, Pipe    Quit date: 09/20/2006  . Smokeless tobacco: Not on file  . Alcohol Use: No  . Drug Use: No  . Sexually Active: Not on file   Other Topics Concern  . Not on file   Social History Narrative  . No narrative on file    Current Outpatient Prescriptions on File Prior to Visit  Medication Sig Dispense Refill  . albuterol (PROVENTIL HFA;VENTOLIN HFA) 108 (90 BASE) MCG/ACT inhaler Inhale 2 puffs into the lungs every 6 (six) hours as needed for wheezing.  1 Inhaler  2  . allopurinol (ZYLOPRIM) 100 MG tablet Take 1 tablet (100 mg total) by mouth daily.  [place]  tablet  3  . atorvastatin (LIPITOR) 40 MG tablet Take 40 mg by mouth daily.        . cholecalciferol (VITAMIN D) 1000 UNITS tablet Take 2,000 Units by mouth daily.       [name] docusate sodium (COLACE) 100 MG capsule Take 300 mg by mouth daily.      [name] gabapentin (NEURONTIN) 300 MG capsule Take 1 capsule (300 mg total) by mouth 3 (three) times daily.  90 capsule  3  . lidocaine (LIDODERM) 5 % Place 1 patch onto the skin daily as needed (wrist pain). Remove & Discard patch within 12 hours or as directed by MD  30 patch  0  . lisinopril (PRINIVIL,ZESTRIL) 10 MG tablet Take 10 mg by mouth daily.       . Multiple Vitamin (MULTIVITAMIN WITH MINERALS) TABS Take 1 tablet by mouth daily.      [name] omeprazole (PRILOSEC) 20 MG capsule Take 1 capsule (20 mg total) by mouth daily.  30 capsule  3  . OVER THE COUNTER MEDICATION Vegetarian shellfish free 1 capsule tid      .  oxyCODONE-acetaminophen (PERCOCET) 10-325 MG per tablet Take 1 tablet by mouth 3 (three) times daily as needed for pain.      . polyethylene glycol (MIRALAX / GLYCOLAX) packet Take 17 g by mouth 2 (two) times daily.  14 each  0  . sennosides-docusate sodium (SENOKOT-S) 8.6-50 MG tablet Take 1 tablet by mouth 2 (two) times daily.      . traMADol (ULTRAM) 50 MG tablet Take 50 mg by mouth every 4 (four) hours as needed for pain.       No current facility-administered medications on file prior to visit.    No Known Allergies  Review of Systems  Review of Systems  Constitutional: Negative for fever and malaise/fatigue.  HENT: Negative for congestion.   Eyes: Negative for pain and discharge.  Respiratory: Negative for shortness of breath.   Cardiovascular: Negative for chest pain, palpitations and leg swelling.  Gastrointestinal: Negative for nausea, abdominal pain and diarrhea.  Genitourinary: Negative for dysuria.  Musculoskeletal: Negative for falls.  Skin: Negative for rash.  Neurological: Negative for loss of consciousness and headaches.  Endo/Heme/Allergies: Negative for polydipsia.  Psychiatric/Behavioral: Negative for depression and suicidal ideas. The patient is not nervous/anxious and does not have insomnia.     Objective  BP 108/62  Pulse 67  Temp(Src) 97.9 F (36.6 C) (Oral)  Ht 6\' 2"  (1.88 m)  SpO2 97%  Physical Exam  Physical Exam  Constitutional: He is oriented to person, place, and time and well-developed, well-nourished, and in no distress. No distress.  obese  HENT:  Head: Normocephalic and atraumatic.  Eyes: Conjunctivae are normal.  Neck: Neck supple. No thyromegaly present.  Cardiovascular: Normal rate and regular rhythm.  Exam reveals no gallop.   No murmur heard. Pulmonary/Chest: Effort normal and breath sounds normal. No respiratory distress.  Abdominal: He exhibits no distension and no mass. There is no tenderness.  Musculoskeletal: He exhibits no  edema.  Neurological: He is alert and oriented to person, place, and time.  Skin: Skin is warm.  Psychiatric: Memory, affect and judgment normal.    No results found for this basename: TSH   Lab Results  Component Value Date   WBC 9.1 04/23/2013   HGB 10.8* 04/23/2013   HCT 33.1* 04/23/2013   MCV 85.5 04/23/2013   PLT 218 04/23/2013   Lab Results  Component Value Date   CREATININE  0.96 03/26/2013   BUN 21 03/26/2013   NA 136 03/26/2013   K 5.2 03/26/2013   CL 101 03/26/2013   CO2 28 03/26/2013   Lab Results  Component Value Date   ALT 16 03/26/2013   AST 18 03/26/2013   ALKPHOS 66 03/26/2013   BILITOT 0.4 03/26/2013     Assessment & Plan  GOUT Burning pain in wrists is better as gout improves, continue to hydrate well, continue Allopurinol  ANEMIA Improving, has stopped his iron in last week due to constipation. Try to increase iron in diet  EDEMA Improved some maintain DASH diet.. Stay active as tolerated and elevate feet as able  OBSTRUCTIVE SLEEP APNEA Using CPAP routinely

## 2013-06-07 ENCOUNTER — Ambulatory Visit (INDEPENDENT_AMBULATORY_CARE_PROVIDER_SITE_OTHER): Payer: BC Managed Care – HMO | Admitting: Family Medicine

## 2013-06-07 ENCOUNTER — Other Ambulatory Visit: Payer: Self-pay | Admitting: Family Medicine

## 2013-06-07 ENCOUNTER — Encounter: Payer: Self-pay | Admitting: Family Medicine

## 2013-06-07 VITALS — BP 114/70 | HR 71 | Temp 98.4°F | Ht 74.0 in | Wt 340.1 lb

## 2013-06-07 DIAGNOSIS — Z79899 Other long term (current) drug therapy: Secondary | ICD-10-CM

## 2013-06-07 DIAGNOSIS — D649 Anemia, unspecified: Secondary | ICD-10-CM

## 2013-06-07 DIAGNOSIS — R030 Elevated blood-pressure reading, without diagnosis of hypertension: Secondary | ICD-10-CM

## 2013-06-07 DIAGNOSIS — G4733 Obstructive sleep apnea (adult) (pediatric): Secondary | ICD-10-CM

## 2013-06-07 DIAGNOSIS — IMO0001 Reserved for inherently not codable concepts without codable children: Secondary | ICD-10-CM

## 2013-06-07 DIAGNOSIS — E785 Hyperlipidemia, unspecified: Secondary | ICD-10-CM

## 2013-06-07 DIAGNOSIS — R52 Pain, unspecified: Secondary | ICD-10-CM

## 2013-06-07 DIAGNOSIS — M109 Gout, unspecified: Secondary | ICD-10-CM

## 2013-06-07 DIAGNOSIS — Z23 Encounter for immunization: Secondary | ICD-10-CM

## 2013-06-07 DIAGNOSIS — M1990 Unspecified osteoarthritis, unspecified site: Secondary | ICD-10-CM

## 2013-06-07 LAB — CBC
HCT: 35.7 % — ABNORMAL LOW (ref 39.0–52.0)
Hemoglobin: 11.6 g/dL — ABNORMAL LOW (ref 13.0–17.0)
MCH: 27.2 pg (ref 26.0–34.0)
MCHC: 32.5 g/dL (ref 30.0–36.0)
MCV: 83.8 fL (ref 78.0–100.0)
Platelets: 220 10*3/uL (ref 150–400)
RBC: 4.26 MIL/uL (ref 4.22–5.81)
RDW: 17 % — ABNORMAL HIGH (ref 11.5–15.5)
WBC: 9.5 10*3/uL (ref 4.0–10.5)

## 2013-06-07 LAB — RENAL FUNCTION PANEL
Albumin: 3.5 g/dL (ref 3.5–5.2)
BUN: 21 mg/dL (ref 6–23)
CO2: 28 mEq/L (ref 19–32)
Calcium: 9.3 mg/dL (ref 8.4–10.5)
Chloride: 101 mEq/L (ref 96–112)
Creat: 0.84 mg/dL (ref 0.50–1.35)
Glucose, Bld: 82 mg/dL (ref 70–99)
Phosphorus: 3.9 mg/dL (ref 2.3–4.6)
Potassium: 4.9 mEq/L (ref 3.5–5.3)
Sodium: 138 mEq/L (ref 135–145)

## 2013-06-07 LAB — HEPATIC FUNCTION PANEL
ALT: 21 U/L (ref 0–53)
AST: 18 U/L (ref 0–37)
Albumin: 3.5 g/dL (ref 3.5–5.2)
Alkaline Phosphatase: 68 U/L (ref 39–117)
Bilirubin, Direct: 0.1 mg/dL (ref 0.0–0.3)
Indirect Bilirubin: 0.3 mg/dL (ref 0.0–0.9)
Total Bilirubin: 0.4 mg/dL (ref 0.3–1.2)
Total Protein: 6 g/dL (ref 6.0–8.3)

## 2013-06-07 LAB — LIPID PANEL
Cholesterol: 118 mg/dL (ref 0–200)
HDL: 33 mg/dL — ABNORMAL LOW (ref >39)
LDL Cholesterol: 68 mg/dL (ref 0–99)
Total CHOL/HDL Ratio: 3.6 Ratio
Triglycerides: 87 mg/dL (ref <150)
VLDL: 17 mg/dL (ref 0–40)

## 2013-06-07 LAB — URIC ACID: Uric Acid, Serum: 6.7 mg/dL (ref 4.0–7.8)

## 2013-06-07 MED ORDER — TRAMADOL HCL 50 MG PO TABS: 50.0000 mg | ORAL_TABLET | Freq: Three times a day (TID) | ORAL | Status: DC | PRN

## 2013-06-07 NOTE — Patient Instructions (Addendum)
Turmeric, Salon Pas, Ice  Black/Tart cherry extract is an option for the gout [email or web address] Probiotic daily  Gout Gout is an inflammatory condition (arthritis) caused by a buildup of uric acid crystals in the joints. Uric acid is a chemical that is normally present in the blood. Under some circumstances, uric acid can form into crystals in your joints. This causes joint redness, soreness, and swelling (inflammation). Repeat attacks are common. Over time, uric acid crystals can form into masses (tophi) near a joint, causing disfigurement. Gout is treatable and often preventable. CAUSES  The disease begins with elevated levels of uric acid in the blood. Uric acid is produced by your body when it breaks down a naturally found substance called purines. This also happens when you eat certain foods such as meats and fish. Causes of an elevated uric acid level include:  Being passed down from parent to child (heredity).  Diseases that cause increased uric acid production (obesity, psoriasis, some cancers).  Excessive alcohol use.  Diet, especially diets rich in meat and seafood.  Medicines, including certain cancer-fighting drugs (chemotherapy), diuretics, and aspirin.  Chronic kidney disease. The kidneys are no longer able to remove uric acid well.  Problems with metabolism. Conditions strongly associated with gout include:  Obesity.  High blood pressure.  High cholesterol.  Diabetes. Not everyone with elevated uric acid levels gets gout. It is not understood why some people get gout and others do not. Surgery, joint injury, and eating too much of certain foods are some of the factors that can lead to gout. SYMPTOMS   An attack of gout comes on quickly. It causes intense pain with redness, swelling, and warmth in a joint.  Fever can occur.  Often, only one joint is involved. Certain joints are more commonly involved:  Base of the big  toe.  Knee.  Ankle.  Wrist.  Finger. Without treatment, an attack usually goes away in a few days to weeks. Between attacks, you usually will not have symptoms, which is different from many other forms of arthritis. DIAGNOSIS  Your caregiver will suspect gout based on your symptoms and exam. Removal of fluid from the joint (arthrocentesis) is done to check for uric acid crystals. Your caregiver will give you a medicine that numbs the area (local anesthetic) and use a needle to remove joint fluid for exam. Gout is confirmed when uric acid crystals are seen in joint fluid, using a special microscope. Sometimes, blood, urine, and X-ray tests are also used. TREATMENT  There are 2 phases to gout treatment: treating the sudden onset (acute) attack and preventing attacks (prophylaxis). Treatment of an Acute Attack  Medicines are used. These include anti-inflammatory medicines or steroid medicines.  An injection of steroid medicine into the affected joint is sometimes necessary.  The painful joint is rested. Movement can worsen the arthritis.  You may use warm or cold treatments on painful joints, depending which works best for you.  Discuss the use of coffee, vitamin C, or cherries with your caregiver. These may be helpful treatment options. Treatment to Prevent Attacks After the acute attack subsides, your caregiver may advise prophylactic medicine. These medicines either help your kidneys eliminate uric acid from your body or decrease your uric acid production. You may need to stay on these medicines for a very long time. The early phase of treatment with prophylactic medicine can be associated with an increase in acute gout attacks. For this reason, during the first few months of treatment, your caregiver may also  advise you to take medicines usually used for acute gout treatment. Be sure you understand your caregiver's directions. You should also discuss dietary treatment with your  caregiver. Certain foods such as meats and fish can increase uric acid levels. Other foods such as dairy can decrease levels. Your caregiver can give you a list of foods to avoid. HOME CARE INSTRUCTIONS   Do not take aspirin to relieve pain. This raises uric acid levels.  Only take over-the-counter or prescription medicines for pain, discomfort, or fever as directed by your caregiver.  Rest the joint as much as possible. When in bed, keep sheets and blankets off painful areas.  Keep the affected joint raised (elevated).  Use crutches if the painful joint is in your leg.  Drink enough water and fluids to keep your urine clear or pale yellow. This helps your body get rid of uric acid. Do not drink alcoholic beverages. They slow the passage of uric acid.  Follow your caregiver's dietary instructions. Pay careful attention to the amount of protein you eat. Your daily diet should emphasize fruits, vegetables, whole grains, and fat-free or low-fat milk products.  Maintain a healthy body weight. SEEK MEDICAL CARE IF:   You have an oral temperature above 102 F (38.9 C).  You develop diarrhea, vomiting, or any side effects from medicines.  You do not feel better in 24 hours, or you are getting worse. SEEK IMMEDIATE MEDICAL CARE IF:   Your joint becomes suddenly more tender and you have:  Chills.  An oral temperature above 102 F (38.9 C), not controlled by medicine. MAKE SURE YOU:   Understand these instructions.  Will watch your condition.  Will get help right away if you are not doing well or get worse. Document Released: 09/03/2000 Document Revised: 11/29/2011 Document Reviewed: 12/15/2009 [facility] Patient Information 2014 [place].

## 2013-06-07 NOTE — Progress Notes (Signed)
Patient ID: [name], male   DOB: [birth year], 72 y.o.   MRN: [id number] [name] [id number] [birth year] 06/07/2013      Progress Note-Follow Up  Subjective  Chief Complaint  Chief Complaint  Patient presents with  . Follow-up  . Injections    flu    HPI  Patient is a 72 year old male who is in today for followup. She's doing much better. He's been: A nutritionist instead has lost 17 pounds. HEENT mellitus he still struggles with shortness or breath weakness and pain but all the things are improving. He had no acute concerning episodes. He has had a couple of episodes of feeling lightheaded upon arising quickly but no falls or syncope. No illness. No chest pain or palpitations. No GI or GU complaints  Past Medical History  Diagnosis Date  . Valvular heart disease   . Aortic stenosis   . Osteoarthritis   . Anemia   . OSA (obstructive sleep apnea)   . Bursitis   . Gout   . Arthritis   . Hyperlipidemia   . Morbid obesity   . DDD (degenerative disc disease)   . Mixed sensory-motor polyneuropathy   . Peroneal palsy     significant right foot drop  . Hemorrhoids     hx of bleeding  . Hypertension   . History of shingles   . Measles as a child  . Chicken pox as a child    Past Surgical History  Procedure Laterality Date  . Total knee arthroplasty      right  . Spine surgery    . Laminotomy  1193    c6-t2  . Hernia repair      umbilical hernia  . Knee surgery Right     multiple knee surgeries due to complication of R TKA  . Colonoscopy N/A 02/07/2013    Procedure: COLONOSCOPY;  Surgeon: Juanita Craver, MD;  Location: [facility] ENDOSCOPY;  Service: Endoscopy;  Laterality: N/A;  . Esophagogastroduodenoscopy N/A 02/07/2013    Procedure: ESOPHAGOGASTRODUODENOSCOPY (EGD);  Surgeon: Juanita Craver, MD;  Location: [facility] ENDOSCOPY;  Service: Endoscopy;  Laterality: N/A;  . Colonoscopy N/A 02/09/2013    Procedure: COLONOSCOPY;  Surgeon: Beryle Beams, MD;  Location: [place];   Service: Endoscopy;  Laterality: N/A;  . Givens capsule study N/A 02/09/2013    Procedure: GIVENS CAPSULE STUDY;  Surgeon: Beryle Beams, MD;  Location: [place];  Service: Endoscopy;  Laterality: N/A;    Family History  Problem Relation Age of Onset  . Arthritis Sister     "crippling"  . Arthritis Sister     History   Social History  . Marital Status: Married    Spouse Name: N/A    Number of Children: N/A  . Years of Education: N/A   Occupational History  . retired Administrator    Social History Main Topics  . Smoking status: Former Smoker    Types: Cigarettes, Pipe    Quit date: 09/20/2006  . Smokeless tobacco: Not on file  . Alcohol Use: No  . Drug Use: No  . Sexual Activity: Not on file   Other Topics Concern  . Not on file   Social History Narrative  . No narrative on file    Current Outpatient Prescriptions on File Prior to Visit  Medication Sig Dispense Refill  . albuterol (PROVENTIL HFA;VENTOLIN HFA) 108 (90 BASE) MCG/ACT inhaler Inhale 2 puffs into the lungs every 6 (six) hours as needed for wheezing.  1 Inhaler  2  . allopurinol (ZYLOPRIM) 100 MG tablet Take 1 tablet (100 mg total) by mouth daily.  30 tablet  3  . atorvastatin (LIPITOR) 40 MG tablet Take 40 mg by mouth daily.        . cholecalciferol (VITAMIN D) 1000 UNITS tablet Take 2,000 Units by mouth daily.       [name] docusate sodium (COLACE) 100 MG capsule Take 300 mg by mouth daily.      [name] gabapentin (NEURONTIN) 300 MG capsule Take 1 capsule (300 mg total) by mouth 3 (three) times daily.  90 capsule  3  . lidocaine (LIDODERM) 5 % Place 1 patch onto the skin daily as needed (wrist pain). Remove & Discard patch within 12 hours or as directed by MD  30 patch  0  . lisinopril (PRINIVIL,ZESTRIL) 10 MG tablet Take 10 mg by mouth daily.       . Multiple Vitamin (MULTIVITAMIN WITH MINERALS) TABS Take 1 tablet by mouth daily.      [name] omeprazole (PRILOSEC) 20 MG capsule Take 1 capsule (20 mg total) by mouth  daily.  30 capsule  3  . OVER THE COUNTER MEDICATION Vegetarian shellfish free 1 capsule tid      . oxyCODONE-acetaminophen (PERCOCET) 10-325 MG per tablet Take 1 tablet by mouth 3 (three) times daily as needed for pain.      . polyethylene glycol (MIRALAX / GLYCOLAX) packet Take 17 g by mouth 2 (two) times daily.  14 each  0  . sennosides-docusate sodium (SENOKOT-S) 8.6-50 MG tablet Take 1 tablet by mouth 2 (two) times daily.       No current facility-administered medications on file prior to visit.    No Known Allergies  Review of Systems  Review of Systems  Constitutional: Positive for malaise/fatigue. Negative for fever.  HENT: Negative for congestion.   Eyes: Negative for discharge.  Respiratory: Positive for shortness of breath.   Cardiovascular: Negative for chest pain, palpitations and leg swelling.  Gastrointestinal: Negative for nausea, abdominal pain and diarrhea.  Genitourinary: Negative for dysuria.  Musculoskeletal: Positive for joint pain. Negative for falls.  Skin: Negative for rash.  Neurological: Negative for loss of consciousness and headaches.  Endo/Heme/Allergies: Negative for polydipsia.  Psychiatric/Behavioral: Negative for depression and suicidal ideas. The patient is not nervous/anxious and does not have insomnia.     Objective  BP 114/70  Pulse 71  Temp(Src) 98.4 F (36.9 C) (Oral)  Ht 6\' 2"  (1.88 m)  Wt 340 lb 1.3 oz (154.259 kg)  BMI 43.65 kg/m2  SpO2 96%  Physical Exam  Physical Exam  Constitutional: He is oriented to person, place, and time and well-developed, well-nourished, and in no distress. No distress.  HENT:  Head: Normocephalic and atraumatic.  Eyes: Conjunctivae are normal.  Neck: Neck supple. No thyromegaly present.  Cardiovascular: Normal rate, regular rhythm and normal heart sounds.   No murmur heard. Pulmonary/Chest: Effort normal and breath sounds normal. No respiratory distress.  Abdominal: He exhibits no distension and no  mass. There is no tenderness.  Musculoskeletal: He exhibits no edema.  Neurological: He is alert and oriented to person, place, and time.  Skin: Skin is warm.  Psychiatric: Memory, affect and judgment normal.    No results found for this basename: TSH   Lab Results  Component Value Date   WBC 9.5 06/07/2013   HGB 11.6* 06/07/2013   HCT 35.7* 06/07/2013   MCV 83.8 06/07/2013   PLT 220 06/07/2013   Lab Results  Component Value Date   CREATININE 0.96 03/26/2013   BUN 21 03/26/2013   NA 136 03/26/2013   K 5.2 03/26/2013   CL 101 03/26/2013   CO2 28 03/26/2013   Lab Results  Component Value Date   ALT 16 03/26/2013   AST 18 03/26/2013   ALKPHOS 66 03/26/2013   BILITOT 0.4 03/26/2013      Assessment & Plan  OBESITY, MORBID Using [employer] with good results, continue the same  ANEMIA Improving. Continue the same  OSTEOARTHRITIS Continues to struggle with chronic pain, most notably at wrists, is continuing to attempt movement and weight loss. May use Salon Pas prn.  OBSTRUCTIVE SLEEP APNEA Using CPAP nightly

## 2013-06-08 LAB — TSH: TSH: 0.831 u[IU]/mL (ref 0.350–4.500)

## 2013-06-08 LAB — VITAMIN D 25 HYDROXY (VIT D DEFICIENCY, FRACTURES): Vit D, 25-Hydroxy: 49 ng/mL (ref 30–89)

## 2013-06-09 NOTE — Assessment & Plan Note (Signed)
Using [employer] with good results, continue the same

## 2013-06-09 NOTE — Assessment & Plan Note (Signed)
Using CPAP nightly 

## 2013-06-09 NOTE — Assessment & Plan Note (Addendum)
Continues to struggle with chronic pain, most notably at wrists, is continuing to attempt movement and weight loss. May use Salon Pas prn.

## 2013-06-09 NOTE — Assessment & Plan Note (Signed)
Improving. Continue the same

## 2013-06-14 ENCOUNTER — Telehealth: Payer: Self-pay | Admitting: Family Medicine

## 2013-06-14 NOTE — Telephone Encounter (Signed)
Received medical records from [place]: [id number] F: [phone]

## 2013-06-19 ENCOUNTER — Telehealth: Payer: Self-pay | Admitting: Family Medicine

## 2013-06-19 NOTE — Telephone Encounter (Signed)
Received medical records from [place] [facility] [place]-Dr. Alwyn Ren: [id number] F: [phone]

## 2013-06-22 ENCOUNTER — Telehealth: Payer: Self-pay | Admitting: *Deleted

## 2013-06-22 NOTE — Telephone Encounter (Signed)
Patient requesting results for Uric Acic [gout]/SLS Please Advise.

## 2013-06-24 NOTE — Telephone Encounter (Signed)
It should be in his my chart, please make sure I released it correctly and he got it. It is better uric acid is down to 6.7. Would not change Allopurinol level at this time. Pain should continue to improve if it is all gout related. If there is a secondary source of pain may need referral to ortho for more consideration

## 2013-06-25 NOTE — Telephone Encounter (Signed)
Patient informed and voiced understanding

## 2013-07-21 ENCOUNTER — Other Ambulatory Visit: Payer: Self-pay | Admitting: Family Medicine

## 2013-07-27 ENCOUNTER — Telehealth: Payer: Self-pay

## 2013-07-27 MED ORDER — ZOSTER VACCINE LIVE 19400 UNT/0.65ML ~~LOC~~ SOLR: 0.6500 mL | Freq: Once | SUBCUTANEOUS | Status: DC

## 2013-07-27 NOTE — Telephone Encounter (Signed)
pts spouse left a message stating that they would like an RX for the shingles vaccination to be sent to [facility] [place].  RX sent

## 2013-08-02 ENCOUNTER — Other Ambulatory Visit: Payer: Self-pay | Admitting: Family Medicine

## 2013-08-02 ENCOUNTER — Ambulatory Visit (INDEPENDENT_AMBULATORY_CARE_PROVIDER_SITE_OTHER): Payer: BC Managed Care – HMO | Admitting: Family Medicine

## 2013-08-02 ENCOUNTER — Encounter: Payer: Self-pay | Admitting: Family Medicine

## 2013-08-02 VITALS — BP 126/84 | HR 66 | Temp 98.4°F | Resp 20 | Ht 74.0 in | Wt 325.5 lb

## 2013-08-02 DIAGNOSIS — E7800 Pure hypercholesterolemia, unspecified: Secondary | ICD-10-CM

## 2013-08-02 DIAGNOSIS — D649 Anemia, unspecified: Secondary | ICD-10-CM

## 2013-08-02 DIAGNOSIS — M109 Gout, unspecified: Secondary | ICD-10-CM

## 2013-08-02 DIAGNOSIS — M129 Arthropathy, unspecified: Secondary | ICD-10-CM

## 2013-08-02 LAB — URIC ACID: Uric Acid, Serum: 6.6 mg/dL (ref 4.0–7.8)

## 2013-08-02 LAB — CBC
HCT: 34.9 % — ABNORMAL LOW (ref 39.0–52.0)
Hemoglobin: 11.3 g/dL — ABNORMAL LOW (ref 13.0–17.0)
MCH: 27.6 pg (ref 26.0–34.0)
MCHC: 32.4 g/dL (ref 30.0–36.0)
MCV: 85.3 fL (ref 78.0–100.0)
Platelets: 208 10*3/uL (ref 150–400)
RBC: 4.09 MIL/uL — ABNORMAL LOW (ref 4.22–5.81)
RDW: 18.1 % — ABNORMAL HIGH (ref 11.5–15.5)
WBC: 9.1 10*3/uL (ref 4.0–10.5)

## 2013-08-02 NOTE — Patient Instructions (Addendum)
Call insurance to find out if you have had a pneumonia shot neutrogena Tgel shampoo wash twice weekly    Anemia, Nonspecific Anemia is a condition in which the concentration of red blood cells or hemoglobin in the blood is below normal. Hemoglobin is a substance in red blood cells that carries oxygen to the tissues of the body. Anemia results in not enough oxygen reaching these tissues.  CAUSES  Common causes of anemia include:   Excessive bleeding. Bleeding may be internal or external. This includes excessive bleeding from periods (in women) or from the intestine.   Poor nutrition.   Chronic kidney, thyroid, and liver disease.  Bone marrow disorders that decrease red blood cell production.  Cancer and treatments for cancer.  HIV, AIDS, and their treatments.  Spleen problems that increase red blood cell destruction.  Blood disorders.  Excess destruction of red blood cells due to infection, medicines, and autoimmune disorders. SIGNS AND SYMPTOMS   Minor weakness.   Dizziness.   Headache.  Palpitations.   Shortness of breath, especially with exercise.   Paleness.  Cold sensitivity.  Indigestion.  Nausea.  Difficulty sleeping.  Difficulty concentrating. Symptoms may occur suddenly or they may develop slowly.  DIAGNOSIS  Additional blood tests are often needed. These help your health care provider determine the best treatment. Your health care provider will check your stool for blood and look for other causes of blood loss.  TREATMENT  Treatment varies depending on the cause of the anemia. Treatment can include:   Supplements of iron, vitamin 123456, or folic acid.   Hormone medicines.   A blood transfusion. This may be needed if blood loss is severe.   Hospitalization. This may be needed if there is significant continual blood loss.   Dietary changes.  Spleen removal. HOME CARE INSTRUCTIONS Keep all follow-up appointments. It often takes many  weeks to correct anemia, and having your health care provider check on your condition and your response to treatment is very important. SEEK IMMEDIATE MEDICAL CARE IF:   You develop extreme weakness, shortness of breath, or chest pain.   You become dizzy or have trouble concentrating.  You develop heavy vaginal bleeding.   You develop a rash.   You have bloody or black, tarry stools.   You faint.   You vomit up blood.   You vomit repeatedly.   You have abdominal pain.  You have a fever or persistent symptoms for more than 2 3 days.   You have a fever and your symptoms suddenly get worse.   You are dehydrated.  MAKE SURE YOU:  Understand these instructions.  Will watch your condition.  Will get help right away if you are not doing well or get worse. Document Released: 10/14/2004 Document Revised: 05/09/2013 Document Reviewed: 03/02/2013 [facility] Patient Information 2014 [place].

## 2013-08-02 NOTE — Progress Notes (Signed)
Pre-visit discussion using our clinic review tool. No additional management support is needed unless otherwise documented below in the visit note.[name]

## 2013-08-05 NOTE — Assessment & Plan Note (Signed)
Tolerating Lipitor avoid trans fats. Will monitor

## 2013-08-05 NOTE — Assessment & Plan Note (Signed)
Good weight loss since last visit. Encouraged ongoing endeavers.

## 2013-08-05 NOTE — Progress Notes (Signed)
Patient ID: [name], male   DOB: [birth year], 72 y.o.   MRN: [id number] [name] [id number] January 13, 1941 08/05/2013      Progress Note-Follow Up  Subjective  Chief Complaint  Chief Complaint  Patient presents with  . Follow-up    16-month [Hyperlipidemia; OSA; Osteoarthritis; Morbid Obesity; Anemia]    HPI  Patient is a 72 year old Caucasian male who is in today accompanied by his wife. He continues to improve. He's been following a 2 systems diet and has lost 15 pounds since his last visit he is becoming more mobile. He continues to struggle with pain and weakness in his wrist still manages to get up and move more. Denies chest pain or palpitations. No recent illness or hospitalization. Agrees to pneumonia shot today dyspnea is improving. Taking medications as prescribed  Past Medical History  Diagnosis Date  . Valvular heart disease   . Aortic stenosis   . Osteoarthritis   . Anemia   . OSA (obstructive sleep apnea)   . Bursitis   . Gout   . Arthritis   . Hyperlipidemia   . Morbid obesity   . DDD (degenerative disc disease)   . Mixed sensory-motor polyneuropathy   . Peroneal palsy     significant right foot drop  . Hemorrhoids     hx of bleeding  . Hypertension   . History of shingles   . Measles as a child  . Chicken pox as a child    Past Surgical History  Procedure Laterality Date  . Total knee arthroplasty      right  . Spine surgery    . Laminotomy  1193    c6-t2  . Hernia repair      umbilical hernia  . Knee surgery Right     multiple knee surgeries due to complication of R TKA  . Colonoscopy N/A 02/07/2013    Procedure: COLONOSCOPY;  Surgeon: Juanita Craver, MD;  Location: [facility] ENDOSCOPY;  Service: Endoscopy;  Laterality: N/A;  . Esophagogastroduodenoscopy N/A 02/07/2013    Procedure: ESOPHAGOGASTRODUODENOSCOPY (EGD);  Surgeon: Juanita Craver, MD;  Location: [facility] ENDOSCOPY;  Service: Endoscopy;  Laterality: N/A;  . Colonoscopy N/A 02/09/2013    Procedure:  COLONOSCOPY;  Surgeon: Beryle Beams, MD;  Location: [place];  Service: Endoscopy;  Laterality: N/A;  . Givens capsule study N/A 02/09/2013    Procedure: GIVENS CAPSULE STUDY;  Surgeon: Beryle Beams, MD;  Location: [place];  Service: Endoscopy;  Laterality: N/A;    Family History  Problem Relation Age of Onset  . Arthritis Sister     "crippling"  . Arthritis Sister     History   Social History  . Marital Status: Married    Spouse Name: N/A    Number of Children: N/A  . Years of Education: N/A   Occupational History  . retired Administrator    Social History Main Topics  . Smoking status: Former Smoker    Types: Cigarettes, Pipe    Quit date: 09/20/2006  . Smokeless tobacco: Not on file  . Alcohol Use: No  . Drug Use: No  . Sexual Activity: Not on file   Other Topics Concern  . Not on file   Social History Narrative  . No narrative on file    Current Outpatient Prescriptions on File Prior to Visit  Medication Sig Dispense Refill  . albuterol (PROVENTIL HFA;VENTOLIN HFA) 108 (90 BASE) MCG/ACT inhaler Inhale 2 puffs into the lungs every 6 (six) hours as needed  for wheezing.  1 Inhaler  2  . allopurinol (ZYLOPRIM) 100 MG tablet TAKE 1 TABLET BY MOUTH DAILY  30 tablet  3  . atorvastatin (LIPITOR) 40 MG tablet Take 40 mg by mouth daily.        . cholecalciferol (VITAMIN D) 1000 UNITS tablet Take 2,000 Units by mouth daily.       [name] docusate sodium (COLACE) 100 MG capsule Take 300 mg by mouth daily.      [name] gabapentin (NEURONTIN) 300 MG capsule Take 1 capsule (300 mg total) by mouth 3 (three) times daily.  90 capsule  3  . lidocaine (LIDODERM) 5 % Place 1 patch onto the skin daily as needed (wrist pain). Remove & Discard patch within 12 hours or as directed by MD  30 patch  0  . lisinopril (PRINIVIL,ZESTRIL) 10 MG tablet Take 10 mg by mouth daily.       . Multiple Vitamin (MULTIVITAMIN WITH MINERALS) TABS Take 1 tablet by mouth daily.      [name] omeprazole (PRILOSEC)  20 MG capsule Take 1 capsule (20 mg total) by mouth daily.  30 capsule  3  . OVER THE COUNTER MEDICATION Vegetarian shellfish free 1 capsule tid      . oxyCODONE-acetaminophen (PERCOCET) 10-325 MG per tablet Take 1 tablet by mouth 3 (three) times daily as needed for pain.      . polyethylene glycol (MIRALAX / GLYCOLAX) packet Take 17 g by mouth 2 (two) times daily.  14 each  0  . sennosides-docusate sodium (SENOKOT-S) 8.6-50 MG tablet Take 1 tablet by mouth 2 (two) times daily.      . traMADol (ULTRAM) 50 MG tablet Take 1 tablet (50 mg total) by mouth every 8 (eight) hours as needed for pain.  90 tablet  2   No current facility-administered medications on file prior to visit.    No Known Allergies  Review of Systems  Review of Systems  Constitutional: Negative for fever and malaise/fatigue.  HENT: Negative for congestion.   Eyes: Negative for discharge.  Respiratory: Negative for shortness of breath.   Cardiovascular: Negative for chest pain, palpitations and leg swelling.  Gastrointestinal: Negative for nausea, abdominal pain and diarrhea.  Genitourinary: Negative for dysuria.  Musculoskeletal: Negative for falls.  Skin: Negative for rash.  Neurological: Negative for loss of consciousness and headaches.  Endo/Heme/Allergies: Negative for polydipsia.  Psychiatric/Behavioral: Negative for depression and suicidal ideas. The patient is not nervous/anxious and does not have insomnia.     Objective  BP 126/84  Pulse 66  Temp(Src) 98.4 F (36.9 C) (Oral)  Resp 20  Ht 6\' 2"  (1.88 m)  Wt 325 lb 8 oz (147.646 kg)  BMI 41.77 kg/m2  SpO2 98%  Physical Exam  Physical Exam  Constitutional: He is oriented to person, place, and time and well-developed, well-nourished, and in no distress. No distress.  HENT:  Head: Normocephalic and atraumatic.  Eyes: Conjunctivae are normal.  Neck: Neck supple. No thyromegaly present.  Cardiovascular: Normal rate, regular rhythm and normal heart  sounds.   Pulmonary/Chest: Effort normal and breath sounds normal. No respiratory distress.  Abdominal: He exhibits no distension and no mass. There is no tenderness.  Musculoskeletal: He exhibits edema.  Neurological: He is alert and oriented to person, place, and time.  Skin: Skin is warm.  Psychiatric: Memory, affect and judgment normal.    Lab Results  Component Value Date   TSH 0.831 06/07/2013   Lab Results  Component Value Date  WBC 9.1 08/02/2013   HGB 11.3* 08/02/2013   HCT 34.9* 08/02/2013   MCV 85.3 08/02/2013   PLT 208 08/02/2013   Lab Results  Component Value Date   CREATININE 0.84 06/07/2013   BUN 21 06/07/2013   NA 138 06/07/2013   K 4.9 06/07/2013   CL 101 06/07/2013   CO2 28 06/07/2013   Lab Results  Component Value Date   ALT 21 06/07/2013   AST 18 06/07/2013   ALKPHOS 68 06/07/2013   BILITOT 0.4 06/07/2013   Lab Results  Component Value Date   CHOL 118 06/07/2013   Lab Results  Component Value Date   HDL 33* 06/07/2013   Lab Results  Component Value Date   LDLCALC 68 06/07/2013   Lab Results  Component Value Date   TRIG 87 06/07/2013   Lab Results  Component Value Date   CHOLHDL 3.6 06/07/2013     Assessment & Plan  HYPERCHOLESTEROLEMIA Tolerating Lipitor avoid trans fats. Will monitor  ANEMIA Stable will continue to monitor  OBESITY, MORBID Good weight loss since last visit. Encouraged ongoing endeavers.  ARTHRITIS Continues to struggle with wrist pain but is still managing to get up and move more

## 2013-08-05 NOTE — Assessment & Plan Note (Signed)
Continues to struggle with wrist pain but is still managing to get up and move more

## 2013-08-05 NOTE — Assessment & Plan Note (Signed)
Stable--will continue to monitor

## 2013-09-18 ENCOUNTER — Ambulatory Visit (INDEPENDENT_AMBULATORY_CARE_PROVIDER_SITE_OTHER): Payer: BC Managed Care – HMO | Admitting: Internal Medicine

## 2013-09-18 ENCOUNTER — Encounter: Payer: Self-pay | Admitting: Internal Medicine

## 2013-09-18 VITALS — BP 118/64 | HR 73 | Ht 74.0 in | Wt 325.0 lb

## 2013-09-18 DIAGNOSIS — G4733 Obstructive sleep apnea (adult) (pediatric): Secondary | ICD-10-CM

## 2013-09-18 DIAGNOSIS — I38 Endocarditis, valve unspecified: Secondary | ICD-10-CM

## 2013-09-18 NOTE — Progress Notes (Signed)
08/08/11- 70 yoM former smoker followed for OSA complicated by obesity, aortic stenosis, osteoarthritis LOV- 03/17/2010   wife here He has had flu vaccine At last visit CPAP 15 was too low and wife says he snored through it. We moved him back up to 18 CWP. He is comfortable with this and describes good compliance and control. He says because of his size, he never had heart valve replacement.  09/18/12 65 yoM former smoker followed for OSA complicated by obesity, aortic stenosis, osteoarthritis FOLLOWS [id number] wears CPAP/18/[facility] every night for about 8 hours; pressure working well for patient. He says he likes CPAP and has no issues with it. He remains comfortable with pressure. He has had some problems with the DME company getting timely supplies, but he will discuss this with [facility].  ROS- see HPI Constitutional:   No-   weight loss, night sweats, fevers, chills, fatigue, lassitude. HEENT:   No-  headaches, difficulty swallowing, tooth/dental problems, sore throat,       No-  sneezing, itching, ear ache, nasal congestion, post nasal drip,  CV:  No-   chest pain, orthopnea, PND, swelling in lower extremities, anasarca,  dizziness, palpitations Resp: +shortness of breath with exertion or at rest.              No-   productive cough,  No non-productive cough,  No- coughing up of blood.              No-   change in color of mucus.  No- wheezing.   Skin: No-   rash or lesions. GI:  No-   heartburn, indigestion, abdominal pain, nausea, vomiting, GU:  MS:  No-   joint pain or swelling.   Neuro-     nothing unusual Psych:  No- change in mood or affect. No depression or anxiety.  No memory loss.  OBJ General- Alert, Oriented, Affect-appropriate, Distress- none acute. Morbidly obese, power                   wheelchair  Skin- rash-none, lesions- none, excoriation- none Lymphadenopathy- none Head- atraumatic            Eyes- Gross vision intact, PERRLA, conjunctivae clear secretions        Ears- Hearing, canals-normal            Nose- Clear, no-Septal dev, mucus, polyps, erosion, perforation             Throat- Mallampati III-IV , mucosa clear , drainage- none, tonsils- atrophic Neck- flexible , trachea midline, no stridor , thyroid nl, carotid no bruit Chest - symmetrical excursion , unlabored           Heart/CV- RRR ,+ [id number] systolic high pitched precordial/ left parasternal murmur , no gallop,  no rub, nl s1 s2                           - JVD- none , edema- none, stasis changes- none, varices- none           Lung- clear to P&A, wheeze- none, cough- none , dullness-none, rub- none           Chest wall-  Abd-  Br/ Gen/ Rectal- Not done, not indicated Extrem- cyanosis- none, clubbing, none, atrophy- none, strength- nl Neuro- grossly intact to observation

## 2013-09-18 NOTE — Patient Instructions (Addendum)
We can continue CPAP 18/ [facility]    If you have more problems getting supplies, please let us know.   Order- DME [facility] - replacement CPAP mask of choice and supplies     Dx OSA

## 2013-10-02 ENCOUNTER — Ambulatory Visit (INDEPENDENT_AMBULATORY_CARE_PROVIDER_SITE_OTHER): Payer: BC Managed Care – HMO | Admitting: Family Medicine

## 2013-10-02 ENCOUNTER — Encounter: Payer: Self-pay | Admitting: Family Medicine

## 2013-10-02 VITALS — BP 159/71 | HR 78 | Temp 98.7°F | Resp 18 | Wt 356.0 lb

## 2013-10-02 DIAGNOSIS — L03119 Cellulitis of unspecified part of limb: Secondary | ICD-10-CM

## 2013-10-02 DIAGNOSIS — I872 Venous insufficiency (chronic) (peripheral): Secondary | ICD-10-CM

## 2013-10-02 DIAGNOSIS — I8310 Varicose veins of unspecified lower extremity with inflammation: Secondary | ICD-10-CM

## 2013-10-02 DIAGNOSIS — L03115 Cellulitis of right lower limb: Secondary | ICD-10-CM | POA: Insufficient documentation

## 2013-10-02 DIAGNOSIS — L02419 Cutaneous abscess of limb, unspecified: Secondary | ICD-10-CM

## 2013-10-02 MED ORDER — FLUTICASONE PROPIONATE 0.05 % EX CREA: TOPICAL_CREAM | Freq: Two times a day (BID) | CUTANEOUS | Status: DC

## 2013-10-02 MED ORDER — CEPHALEXIN 500 MG PO CAPS: 500.0000 mg | ORAL_CAPSULE | Freq: Three times a day (TID) | ORAL | Status: DC

## 2013-10-02 MED ORDER — FUROSEMIDE 20 MG PO TABS: ORAL_TABLET | ORAL | Status: DC

## 2013-10-02 NOTE — Progress Notes (Signed)
OFFICE NOTE  10/02/2013  CC:  Chief Complaint  Patient presents with  . Leg Swelling    right leg redness     HPI: Patient is a 73 y.o. Caucasian male who is here accompanied by his wife for a gradual increase in bilateral leg swelling and recent right leg redness. Has noted 3-4 days of worsened right ankle stiffness and medial right calf swelling and redness.  No pain, malaise, or fevers. Has chronic LE venous insuff edema with associated dermatitis and hyperkeratotic skin changes.  Also describes peripheral neuropathy that contributes to his LE swelling and discoloration.   Admits to eating anything he wanted over the recent 1-2 mo of holidays and has gained 30 lbs or so.  When asked about low Na diet he did not seem to know what I was talking about.    No SOB, CP, or cough.  Pertinent PMH:  Past Medical History  Diagnosis Date  . Valvular heart disease   . Aortic stenosis   . Osteoarthritis   . Anemia   . OSA (obstructive sleep apnea)   . Bursitis   . Gout   . Arthritis   . Hyperlipidemia   . Morbid obesity   . DDD (degenerative disc disease)   . Mixed sensory-motor polyneuropathy   . Peroneal palsy     significant right foot drop  . Hemorrhoids     hx of bleeding  . Hypertension   . History of shingles   . Measles as a child  . Chicken pox as a child  Hx of acute blood loss anemia, required multiple transfusions  Past surgical, social, and family history reviewed and no changes noted since last office visit.  MEDS:  Outpatient Prescriptions Prior to Visit  Medication Sig Dispense Refill  . allopurinol (ZYLOPRIM) 100 MG tablet TAKE 1 TABLET BY MOUTH DAILY  30 tablet  3  . cholecalciferol (VITAMIN D) 1000 UNITS tablet Take 2,000 Units by mouth daily.       [name] gabapentin (NEURONTIN) 300 MG capsule Take 1 capsule (300 mg total) by mouth 3 (three) times daily.  90 capsule  3  . lidocaine (LIDODERM) 5 % Place 1 patch onto the skin daily as needed (wrist pain). Remove  & Discard patch within 12 hours or as directed by MD  30 patch  0  . lisinopril (PRINIVIL,ZESTRIL) 10 MG tablet Take 10 mg by mouth daily.       . Multiple Vitamin (MULTIVITAMIN WITH MINERALS) TABS Take 1 tablet by mouth daily.      [name] oxyCODONE-acetaminophen (PERCOCET) 10-325 MG per tablet Take 1 tablet by mouth 3 (three) times daily as needed for pain.      . polyethylene glycol (MIRALAX / GLYCOLAX) packet Take 17 g by mouth 2 (two) times daily.  14 each  0  . sennosides-docusate sodium (SENOKOT-S) 8.6-50 MG tablet Take 1 tablet by mouth 2 (two) times daily.      . traMADol (ULTRAM) 50 MG tablet Take 1 tablet (50 mg total) by mouth every 8 (eight) hours as needed for pain.  90 tablet  2  . albuterol (PROVENTIL HFA;VENTOLIN HFA) 108 (90 BASE) MCG/ACT inhaler Inhale 2 puffs into the lungs every 6 (six) hours as needed for wheezing.  1 Inhaler  2  . atorvastatin (LIPITOR) 40 MG tablet Take 40 mg by mouth daily.        [name] docusate sodium (COLACE) 100 MG capsule Take 300 mg by mouth daily.      [name]  omeprazole (PRILOSEC) 20 MG capsule Take 1 capsule (20 mg total) by mouth daily.  30 capsule  3  . OVER THE COUNTER MEDICATION Vegetarian shellfish free 1 capsule tid       No facility-administered medications prior to visit.    PE: Blood pressure 159/71, pulse 78, temperature 98.7 F (37.1 C), temperature source Temporal, resp. rate 18, weight 356 lb (161.481 kg), SpO2 98.00%. Gen: Alert, well appearing, sitting in motorized WC.  Patient is oriented to person, place, time, and situation. AFFECT: pleasant, lucid thought and speech. LEGS: left leg 2+ pitting from knee down into foot, right leg with 4+ pitting edema from knee down into foot. Legs are of similar warmth.  Right calf is slightly erythematous in medial portion about 1/2 way up but is nontender.  No areas of skin barrier breakdown, no vesicles, no pustules. Skin of LE's is bronze colored in general until about mid foot level where it then turns  more violacious-appearing.  No foot ulcerations.     IMPRESSION AND PLAN:  Gradually worsened asymmetric chronic LE venous insufficiency edema (R>L, as per his usual). Minimal changes in medial right calf suggestive of cellulitis vs acute worsening of chronic venous stasis dermatitis. Plan is to start keflex 500mg  tid x 7d, start low Na diet, elevate legs the best he can, take 20mg  lasix per day x 3d, and apply cutivate generic 0.05% cream to LE's bid.  FOLLOW UP: 1 wk with either Dr. Charlett Blake or myself.

## 2013-10-02 NOTE — Progress Notes (Signed)
Pre visit review using our clinic review tool, if applicable. No additional management support is needed unless otherwise documented below in the visit note. 

## 2013-10-10 NOTE — Assessment & Plan Note (Addendum)
Good compliance and control.he will discuss his issues with [facility] about supplies. I explained the importance of weight and sleep apnea again.

## 2013-10-10 NOTE — Assessment & Plan Note (Signed)
Being managed by cardiology

## 2013-10-11 ENCOUNTER — Ambulatory Visit (INDEPENDENT_AMBULATORY_CARE_PROVIDER_SITE_OTHER): Payer: BC Managed Care – HMO | Admitting: Family Medicine

## 2013-10-11 ENCOUNTER — Encounter: Payer: Self-pay | Admitting: Family Medicine

## 2013-10-11 VITALS — BP 112/70 | HR 74 | Temp 98.2°F | Ht 74.0 in | Wt 351.1 lb

## 2013-10-11 DIAGNOSIS — M109 Gout, unspecified: Secondary | ICD-10-CM

## 2013-10-11 DIAGNOSIS — Z23 Encounter for immunization: Secondary | ICD-10-CM

## 2013-10-11 DIAGNOSIS — D649 Anemia, unspecified: Secondary | ICD-10-CM

## 2013-10-11 DIAGNOSIS — G4733 Obstructive sleep apnea (adult) (pediatric): Secondary | ICD-10-CM

## 2013-10-11 DIAGNOSIS — I10 Essential (primary) hypertension: Secondary | ICD-10-CM

## 2013-10-11 DIAGNOSIS — R233 Spontaneous ecchymoses: Secondary | ICD-10-CM

## 2013-10-11 DIAGNOSIS — K922 Gastrointestinal hemorrhage, unspecified: Secondary | ICD-10-CM

## 2013-10-11 DIAGNOSIS — R238 Other skin changes: Secondary | ICD-10-CM

## 2013-10-11 LAB — CBC
HCT: 38.4 % — ABNORMAL LOW (ref 39.0–52.0)
Hemoglobin: 12.3 g/dL — ABNORMAL LOW (ref 13.0–17.0)
MCH: 28.5 pg (ref 26.0–34.0)
MCHC: 32 g/dL (ref 30.0–36.0)
MCV: 89.1 fL (ref 78.0–100.0)
Platelets: 212 10*3/uL (ref 150–400)
RBC: 4.31 MIL/uL (ref 4.22–5.81)
RDW: 16.9 % — ABNORMAL HIGH (ref 11.5–15.5)
WBC: 8.4 10*3/uL (ref 4.0–10.5)

## 2013-10-11 LAB — LIPID PANEL
Cholesterol: 120 mg/dL (ref 0–200)
HDL: 34 mg/dL — ABNORMAL LOW (ref >39)
LDL Cholesterol: 66 mg/dL (ref 0–99)
Total CHOL/HDL Ratio: 3.5 Ratio
Triglycerides: 100 mg/dL (ref <150)
VLDL: 20 mg/dL (ref 0–40)

## 2013-10-11 LAB — RENAL FUNCTION PANEL
Albumin: 3.7 g/dL (ref 3.5–5.2)
BUN: 19 mg/dL (ref 6–23)
CO2: 28 mEq/L (ref 19–32)
Calcium: 9.2 mg/dL (ref 8.4–10.5)
Chloride: 102 mEq/L (ref 96–112)
Creat: 0.8 mg/dL (ref 0.50–1.35)
Glucose, Bld: 93 mg/dL (ref 70–99)
Phosphorus: 3.9 mg/dL (ref 2.3–4.6)
Potassium: 4.8 mEq/L (ref 3.5–5.3)
Sodium: 137 mEq/L (ref 135–145)

## 2013-10-11 LAB — PROTIME-INR
INR: 1 (ref <1.50)
Prothrombin Time: 13.1 seconds (ref 11.6–15.2)

## 2013-10-11 LAB — HEPATIC FUNCTION PANEL
ALT: 24 U/L (ref 0–53)
AST: 21 U/L (ref 0–37)
Albumin: 3.7 g/dL (ref 3.5–5.2)
Alkaline Phosphatase: 80 U/L (ref 39–117)
Bilirubin, Direct: 0.1 mg/dL (ref 0.0–0.3)
Indirect Bilirubin: 0.3 mg/dL (ref 0.0–0.9)
Total Bilirubin: 0.4 mg/dL (ref 0.3–1.2)
Total Protein: 6.1 g/dL (ref 6.0–8.3)

## 2013-10-11 LAB — TSH: TSH: 1.102 u[IU]/mL (ref 0.350–4.500)

## 2013-10-11 MED ORDER — PNEUMOCOCCAL 13-VAL CONJ VACC IM SUSP: 0.5000 mL | Freq: Once | INTRAMUSCULAR | Status: DC

## 2013-10-11 NOTE — Patient Instructions (Signed)

## 2013-10-11 NOTE — Progress Notes (Signed)
Pre visit review using our clinic review tool, if applicable. No additional management support is needed unless otherwise documented below in the visit note. 

## 2013-10-12 ENCOUNTER — Telehealth: Payer: Self-pay | Admitting: Family Medicine

## 2013-10-12 ENCOUNTER — Encounter: Payer: Self-pay | Admitting: Family Medicine

## 2013-10-12 NOTE — Telephone Encounter (Signed)
Relevant patient education assigned to patient using Emmi. ° °

## 2013-10-15 ENCOUNTER — Encounter: Payer: Self-pay | Admitting: Family Medicine

## 2013-10-15 NOTE — Progress Notes (Signed)
Patient ID: [name], male   DOB: 10/28/1940, 73 y.o.   MRN: [id number] [name] [id number] 1940/11/11 10/15/2013      Progress Note-Follow Up  Subjective  Chief Complaint  Chief Complaint  Patient presents with  . Follow-up    2 month  . Injections    prevnar    HPI  Patient is a 73 year old Caucasian male who is in today for followup. He recently suffered a acute GI bleed and without significant straining and no blood. No abdominal pain, fevers, nausea vomiting. No chest pain, palpitations or shortness of breath. He reports he is in thing he is ambulation slightly.was hospitalized but since stopping his diclofenac and returning home he seen no further blood in his stool. His bowels are moving comfortably. No fevers, chills. Does still struggle with some arm pain R>L. No redness, warmth or falls.  Past Medical History  Diagnosis Date  . Valvular heart disease   . Aortic stenosis   . Osteoarthritis   . Anemia   . OSA (obstructive sleep apnea)   . Bursitis   . Gout   . Arthritis   . Hyperlipidemia   . Morbid obesity   . DDD (degenerative disc disease)   . Mixed sensory-motor polyneuropathy   . Peroneal palsy     significant right foot drop  . Hemorrhoids     hx of bleeding  . Hypertension   . History of shingles   . Measles as a child  . Chicken pox as a child    Past Surgical History  Procedure Laterality Date  . Total knee arthroplasty      right  . Spine surgery    . Laminotomy  1193    c6-t2  . Hernia repair      umbilical hernia  . Knee surgery Right     multiple knee surgeries due to complication of R TKA  . Colonoscopy N/A 02/07/2013    Procedure: COLONOSCOPY;  Surgeon: Juanita Craver, MD;  Location: [facility] ENDOSCOPY;  Service: Endoscopy;  Laterality: N/A;  . Esophagogastroduodenoscopy N/A 02/07/2013    Procedure: ESOPHAGOGASTRODUODENOSCOPY (EGD);  Surgeon: Juanita Craver, MD;  Location: [facility] ENDOSCOPY;  Service: Endoscopy;  Laterality: N/A;  . Colonoscopy  N/A 02/09/2013    Procedure: COLONOSCOPY;  Surgeon: Beryle Beams, MD;  Location: [place];  Service: Endoscopy;  Laterality: N/A;  . Givens capsule study N/A 02/09/2013    Procedure: GIVENS CAPSULE STUDY;  Surgeon: Beryle Beams, MD;  Location: [place];  Service: Endoscopy;  Laterality: N/A;    Family History  Problem Relation Age of Onset  . Arthritis Sister     "crippling"  . Arthritis Sister     History   Social History  . Marital Status: Married    Spouse Name: N/A    Number of Children: N/A  . Years of Education: N/A   Occupational History  . retired Administrator    Social History Main Topics  . Smoking status: Former Smoker    Types: Cigarettes, Pipe    Quit date: 09/20/2006  . Smokeless tobacco: Not on file  . Alcohol Use: No  . Drug Use: No  . Sexual Activity: Not on file   Other Topics Concern  . Not on file   Social History Narrative  . No narrative on file    Current Outpatient Prescriptions on File Prior to Visit  Medication Sig Dispense Refill  . albuterol (PROVENTIL HFA;VENTOLIN HFA) 108 (90 BASE) MCG/ACT inhaler Inhale 2  puffs into the lungs every 6 (six) hours as needed for wheezing.  1 Inhaler  2  . allopurinol (ZYLOPRIM) 100 MG tablet TAKE 1 TABLET BY MOUTH DAILY  30 tablet  3  . atorvastatin (LIPITOR) 40 MG tablet Take 40 mg by mouth daily.        . cholecalciferol (VITAMIN D) 1000 UNITS tablet Take 2,000 Units by mouth daily.       [name] docusate sodium (COLACE) 100 MG capsule Take 300 mg by mouth daily.      . fluticasone (CUTIVATE) 0.05 % cream Apply topically 2 (two) times daily. Apply to lower legs twice daily as needed  60 g  0  . gabapentin (NEURONTIN) 300 MG capsule Take 1 capsule (300 mg total) by mouth 3 (three) times daily.  90 capsule  3  . lidocaine (LIDODERM) 5 % Place 1 patch onto the skin daily as needed (wrist pain). Remove & Discard patch within 12 hours or as directed by MD  30 patch  0  . lisinopril (PRINIVIL,ZESTRIL) 10  MG tablet Take 10 mg by mouth daily.       . Multiple Vitamin (MULTIVITAMIN WITH MINERALS) TABS Take 1 tablet by mouth daily.      [name] OVER THE COUNTER MEDICATION Vegetarian shellfish free 1 capsule tid      . oxyCODONE-acetaminophen (PERCOCET) 10-325 MG per tablet Take 1 tablet by mouth 3 (three) times daily as needed for pain.      . polyethylene glycol (MIRALAX / GLYCOLAX) packet Take 17 g by mouth 2 (two) times daily.  14 each  0  . sennosides-docusate sodium (SENOKOT-S) 8.6-50 MG tablet Take 1 tablet by mouth 2 (two) times daily.      . furosemide (LASIX) 20 MG tablet 1 tab po qd prn increased swelling of lower legs  30 tablet  0   No current facility-administered medications on file prior to visit.    No Known Allergies  Review of Systems  Review of Systems  Constitutional: Negative for fever and malaise/fatigue.  HENT: Negative for congestion.   Eyes: Negative for discharge.  Respiratory: Negative for shortness of breath.   Cardiovascular: Negative for chest pain, palpitations and leg swelling.  Gastrointestinal: Negative for nausea, abdominal pain and diarrhea.  Genitourinary: Negative for dysuria.  Musculoskeletal: Negative for falls.  Skin: Negative for rash.  Neurological: Negative for loss of consciousness and headaches.  Endo/Heme/Allergies: Negative for polydipsia.  Psychiatric/Behavioral: Negative for depression and suicidal ideas. The patient is not nervous/anxious and does not have insomnia.     Objective  BP 112/70  Pulse 74  Temp(Src) 98.2 F (36.8 C) (Oral)  Ht 6\' 2"  (1.88 m)  Wt 351 lb 1.3 oz (159.249 kg)  BMI 45.06 kg/m2  SpO2 96%  Physical Exam  Physical Exam  Constitutional: He is oriented to person, place, and time and well-developed, well-nourished, and in no distress. No distress.  HENT:  Head: Normocephalic and atraumatic.  Eyes: Conjunctivae are normal.  Neck: Neck supple. No thyromegaly present.  Cardiovascular: Normal rate, regular rhythm  and normal heart sounds.   No murmur heard. Pulmonary/Chest: Effort normal and breath sounds normal. No respiratory distress.  Abdominal: He exhibits no distension and no mass. There is no tenderness.  Musculoskeletal: He exhibits no edema.  Neurological: He is alert and oriented to person, place, and time.  Skin: Skin is warm.  Psychiatric: Memory, affect and judgment normal.    Lab Results  Component Value Date   TSH 1.102  10/11/2013   Lab Results  Component Value Date   WBC 8.4 10/11/2013   HGB 12.3* 10/11/2013   HCT 38.4* 10/11/2013   MCV 89.1 10/11/2013   PLT 212 10/11/2013   Lab Results  Component Value Date   CREATININE 0.80 10/11/2013   BUN 19 10/11/2013   NA 137 10/11/2013   K 4.8 10/11/2013   CL 102 10/11/2013   CO2 28 10/11/2013   Lab Results  Component Value Date   ALT 24 10/11/2013   AST 21 10/11/2013   ALKPHOS 80 10/11/2013   BILITOT 0.4 10/11/2013   Lab Results  Component Value Date   CHOL 120 10/11/2013   Lab Results  Component Value Date   HDL 34* 10/11/2013   Lab Results  Component Value Date   LDLCALC 66 10/11/2013   Lab Results  Component Value Date   TRIG 100 10/11/2013   Lab Results  Component Value Date   CHOLHDL 3.5 10/11/2013     Assessment & Plan  GOUT Manages with Allopurinol, cannot use NDSAIDs at this point. Encouraged adequate hydration and avoid offending foods  OBESITY, MORBID Encouraged DASH diet and ongoing efforts to move more  OBSTRUCTIVE SLEEP APNEA Following with pulmonology, reports compliance with CPAP  Acute lower gastrointestinal bleeding Recent episode, none since returning home from hospital, avoid NSAIDs

## 2013-10-15 NOTE — Assessment & Plan Note (Signed)
Following with pulmonology, reports compliance with CPAP

## 2013-10-15 NOTE — Assessment & Plan Note (Signed)
Manages with Allopurinol, cannot use NDSAIDs at this point. Encouraged adequate hydration and avoid offending foods

## 2013-10-15 NOTE — Assessment & Plan Note (Signed)
Encouraged DASH diet and ongoing efforts to move more

## 2013-10-15 NOTE — Assessment & Plan Note (Signed)
Recent episode, none since returning home from hospital, avoid NSAIDs

## 2013-10-22 ENCOUNTER — Ambulatory Visit (INDEPENDENT_AMBULATORY_CARE_PROVIDER_SITE_OTHER): Payer: BC Managed Care – HMO | Admitting: Physician Assistant

## 2013-10-22 ENCOUNTER — Encounter: Payer: Self-pay | Admitting: Physician Assistant

## 2013-10-22 VITALS — BP 118/64 | HR 69 | Temp 98.1°F | Resp 18 | Ht 74.0 in | Wt 351.0 lb

## 2013-10-22 DIAGNOSIS — IMO0002 Reserved for concepts with insufficient information to code with codable children: Secondary | ICD-10-CM

## 2013-10-22 DIAGNOSIS — I8310 Varicose veins of unspecified lower extremity with inflammation: Secondary | ICD-10-CM

## 2013-10-22 DIAGNOSIS — I872 Venous insufficiency (chronic) (peripheral): Secondary | ICD-10-CM

## 2013-10-22 DIAGNOSIS — S90829A Blister (nonthermal), unspecified foot, initial encounter: Secondary | ICD-10-CM

## 2013-10-22 NOTE — Patient Instructions (Signed)
Please finish Bactrim.  Keep legs elevated at home. Continue cream for lower extremities.  Do not pop the other blister.  It will hopefully reabsorb on its own.  Keep area warm, clean and dry.  Place a topical antibiotic ointment on the area and covered with a dressing.  Follow-up in 1 week.  I will try to set up home health for wound care at home over the next week.

## 2013-10-22 NOTE — Progress Notes (Signed)
Pre visit review using our clinic review tool, if applicable. No additional management support is needed unless otherwise documented below in the visit note/SLS  

## 2013-10-24 ENCOUNTER — Ambulatory Visit: Payer: BC Managed Care – HMO | Admitting: Physician Assistant

## 2013-10-27 DIAGNOSIS — S90829A Blister (nonthermal), unspecified foot, initial encounter: Secondary | ICD-10-CM | POA: Insufficient documentation

## 2013-10-27 NOTE — Progress Notes (Signed)
Patient presents to clinic today for ER follow-up of left foot injury on 10/17/13.  Patient was seen in ER in [place].  He does not have records with him at today's visit.  Patient presented to ER after stubbing the big toe of his left foot.  Patient endorses he had imaging of his left foot that was negative for fracture.  Patient had two blisters form on his left great toe and left II phalanx, respectively.  Patient has history of significant chronic venous insufficiency with skin changes associated with venous stasis.  Patient states the blister on his great toe "popped" on the way to his visit today.  Denies pain, redness, tenderness, fever, chills or sweats.  Patient endorses good range of motion of his toes.  Has chronic numbness of feet bilaterally.  Denies history of diabetes.  Patient denies coolness or pallor of LLE.    Past Medical History  Diagnosis Date  . Valvular heart disease   . Aortic stenosis   . Osteoarthritis   . Anemia   . OSA (obstructive sleep apnea)   . Bursitis   . Gout   . Arthritis   . Hyperlipidemia   . Morbid obesity   . DDD (degenerative disc disease)   . Mixed sensory-motor polyneuropathy   . Peroneal palsy     significant right foot drop  . Hemorrhoids     hx of bleeding  . Hypertension   . History of shingles   . Measles as a child  . Chicken pox as a child    Current Outpatient Prescriptions on File Prior to Visit  Medication Sig Dispense Refill  . albuterol (PROVENTIL HFA;VENTOLIN HFA) 108 (90 BASE) MCG/ACT inhaler Inhale 2 puffs into the lungs every 6 (six) hours as needed for wheezing.  1 Inhaler  2  . allopurinol (ZYLOPRIM) 100 MG tablet TAKE 1 TABLET BY MOUTH DAILY  30 tablet  3  . atorvastatin (LIPITOR) 40 MG tablet Take 40 mg by mouth daily.        . cholecalciferol (VITAMIN D) 1000 UNITS tablet Take 2,000 Units by mouth daily.       [name] docusate sodium (COLACE) 100 MG capsule Take 300 mg by mouth daily.      . fluticasone (CUTIVATE) 0.05 % cream  Apply topically 2 (two) times daily. Apply to lower legs twice daily as needed  60 g  0  . furosemide (LASIX) 20 MG tablet 1 tab po qd prn increased swelling of lower legs  30 tablet  0  . gabapentin (NEURONTIN) 300 MG capsule Take 1 capsule (300 mg total) by mouth 3 (three) times daily.  90 capsule  3  . lidocaine (LIDODERM) 5 % Place 1 patch onto the skin daily as needed (wrist pain). Remove & Discard patch within 12 hours or as directed by MD  30 patch  0  . lisinopril (PRINIVIL,ZESTRIL) 10 MG tablet Take 10 mg by mouth daily.       . Multiple Vitamin (MULTIVITAMIN WITH MINERALS) TABS Take 1 tablet by mouth daily.      [name] OVER THE COUNTER MEDICATION Vegetarian shellfish free 1 capsule tid      . oxyCODONE-acetaminophen (PERCOCET) 10-325 MG per tablet Take 1 tablet by mouth 3 (three) times daily as needed for pain.      . polyethylene glycol (MIRALAX / GLYCOLAX) packet Take 17 g by mouth 2 (two) times daily.  14 each  0  . sennosides-docusate sodium (SENOKOT-S) 8.6-50 MG tablet Take 1  tablet by mouth 2 (two) times daily.       No current facility-administered medications on file prior to visit.    No Known Allergies  Family History  Problem Relation Age of Onset  . Arthritis Sister     "crippling"  . Arthritis Sister     History   Social History  . Marital Status: Married    Spouse Name: N/A    Number of Children: N/A  . Years of Education: N/A   Occupational History  . retired Administrator    Social History Main Topics  . Smoking status: Former Smoker    Types: Cigarettes, Pipe    Quit date: 09/20/2006  . Smokeless tobacco: None  . Alcohol Use: No  . Drug Use: No  . Sexual Activity: None   Other Topics Concern  . None   Social History Narrative  . None   Review of Systems - See HPI.  All other ROS are negative.  BP 118/64  Pulse 69  Temp(Src) 98.1 F (36.7 C) (Oral)  Resp 18  Ht 6\' 2"  (1.88 m)  Wt 351 lb (159.213 kg)  BMI 45.05 kg/m2  SpO2 96%  Physical  Exam  Vitals reviewed. Constitutional: He is oriented to person, place, and time and well-developed, well-nourished, and in no distress.  HENT:  Head: Normocephalic and atraumatic.  Eyes: Conjunctivae are normal. Pupils are equal, round, and reactive to light.  Neck: Neck supple.  Cardiovascular: Normal rate, regular rhythm, normal heart sounds and intact distal pulses.   Pulmonary/Chest: Effort normal and breath sounds normal.  Neurological: He is alert and oriented to person, place, and time.  Skin: Skin is warm and dry.  Evidence of chronic venous insufficiency as evidence by bilateral 2+ pitting edema and violaceous discoloration of bilateral lower extremities from the shin to the foot.  No evidence on increased warmth or erythema or skin.  There is a 5 cm bullous blister on left great toe that has recently been broken, with erythema of underlying skin.  There is evidence of a small 2 cm hematoma of the II phalanx of left foot.  Hematoma is intact. PT and DP pulses are 2+ and intact.  No evidence of foot ulceration.  Psychiatric: Affect normal.    Recent Results (from the past 2160 hour(s))  URIC ACID     Status: None   Collection Time    08/02/13 12:20 PM      Result Value Range   Uric Acid, Serum 6.6  4.0 - 7.8 mg/dL  CBC     Status: Abnormal   Collection Time    08/02/13 12:20 PM      Result Value Range   WBC 9.1  4.0 - 10.5 K/uL   RBC 4.09 (*) 4.22 - 5.81 MIL/uL   Hemoglobin 11.3 (*) 13.0 - 17.0 g/dL   HCT 34.9 (*) 39.0 - 52.0 %   MCV 85.3  78.0 - 100.0 fL   MCH 27.6  26.0 - 34.0 pg   MCHC 32.4  30.0 - 36.0 g/dL   RDW 18.1 (*) 11.5 - 15.5 %   Platelets 208  150 - 400 K/uL  LIPID PANEL     Status: Abnormal   Collection Time    10/11/13  2:10 PM      Result Value Range   Cholesterol 120  0 - 200 mg/dL   Comment: ATP III Classification:           < 200  mg/dL        Desirable          200 - 239     mg/dL        Borderline High          >= 240        mg/dL         High         Triglycerides 100  <150 mg/dL   HDL 34 (*) >39 mg/dL   Total CHOL/HDL Ratio 3.5     VLDL 20  0 - 40 mg/dL   LDL Cholesterol 66  0 - 99 mg/dL   Comment:       Total Cholesterol/HDL Ratio:CHD Risk                            Coronary Heart Disease Risk Table                                            Men       Women              1/2 Average Risk              3.4        3.3                  Average Risk              5.0        4.4               2X Average Risk              9.6        7.1               3X Average Risk             23.4       11.0     Use the calculated Patient Ratio above and the CHD Risk table      to determine the patient's CHD Risk.     ATP III Classification (LDL):           < 100        mg/dL         Optimal          100 - 129     mg/dL         Near or Above Optimal          130 - 159     mg/dL         Borderline High          160 - 189     mg/dL         High           > 190        mg/dL         Very High        RENAL FUNCTION PANEL     Status: None   Collection Time    10/11/13  2:10 PM      Result Value Range   Sodium 137  135 - 145 mEq/L   Potassium 4.8  3.5 - 5.3 mEq/L   Chloride 102  96 - 112 mEq/L  CO2 28  19 - 32 mEq/L   Glucose, Bld 93  70 - 99 mg/dL   BUN 19  6 - 23 mg/dL   Creat 0.80  0.50 - 1.35 mg/dL   Albumin 3.7  3.5 - 5.2 g/dL   Calcium 9.2  8.4 - 10.5 mg/dL   Phosphorus 3.9  2.3 - 4.6 mg/dL  HEPATIC FUNCTION PANEL     Status: None   Collection Time    10/11/13  2:10 PM      Result Value Range   Total Bilirubin 0.4  0.3 - 1.2 mg/dL   Bilirubin, Direct 0.1  0.0 - 0.3 mg/dL   Indirect Bilirubin 0.3  0.0 - 0.9 mg/dL   Alkaline Phosphatase 80  39 - 117 U/L   AST 21  0 - 37 U/L   ALT 24  0 - 53 U/L   Total Protein 6.1  6.0 - 8.3 g/dL   Albumin 3.7  3.5 - 5.2 g/dL  CBC     Status: Abnormal   Collection Time    10/11/13  2:10 PM      Result Value Range   WBC 8.4  4.0 - 10.5 K/uL   RBC 4.31  4.22 - 5.81 MIL/uL   Hemoglobin  12.3 (*) 13.0 - 17.0 g/dL   HCT 38.4 (*) 39.0 - 52.0 %   MCV 89.1  78.0 - 100.0 fL   MCH 28.5  26.0 - 34.0 pg   MCHC 32.0  30.0 - 36.0 g/dL   RDW 16.9 (*) 11.5 - 15.5 %   Platelets 212  150 - 400 K/uL  TSH     Status: None   Collection Time    10/11/13  2:10 PM      Result Value Range   TSH 1.102  0.350 - 4.500 uIU/mL  PROTIME-INR     Status: None   Collection Time    10/11/13  2:10 PM      Result Value Range   Prothrombin Time 13.1  11.6 - 15.2 seconds   INR 1.00  <1.50   Comment: The INR is of principal utility in following patients on stable doses     of oral anticoagulants.  The therapeutic range is generally 2.0 to     3.0, but may be 3.0 to 4.0 in patients with mechanical cardiac valves,     recurrent embolisms and antiphospholipid antibodies (including lupus     inhibitors).    Assessment/Plan: Chronic venous insufficiency Continue lasix as prescribed.  Elevate legs at home.  Keep lower extremities moisturized daily.  Return to clinic if developing increased swelling, erythema, tenderness or ulceration.  Venous stasis dermatitis No evidence of ulceration or concomitant cellulitis.  Follow-care as directed for chronic venous insufficiency.  Blister of foot without infection Blister has "popped".  Area was cleaned, topical antibiotic applied and dressed appropriately.  Patient instructed on appropriate wound care.  Continue bactrim as prescribed.  Use topical triple antibiotic ointment to area.  Avoid popping hematoma of left II phalanx.  Return in 1 week for re-evaluation.  Patient educated on alarm signs/symptoms and when to proceed to the ER.

## 2013-10-27 NOTE — Assessment & Plan Note (Signed)
Continue lasix as prescribed.  Elevate legs at home.  Keep lower extremities moisturized daily.  Return to clinic if developing increased swelling, erythema, tenderness or ulceration.

## 2013-10-27 NOTE — Assessment & Plan Note (Signed)
No evidence of ulceration or concomitant cellulitis.  Follow-care as directed for chronic venous insufficiency.

## 2013-10-27 NOTE — Assessment & Plan Note (Signed)
Blister has "popped".  Area was cleaned, topical antibiotic applied and dressed appropriately.  Patient instructed on appropriate wound care.  Continue bactrim as prescribed.  Use topical triple antibiotic ointment to area.  Avoid popping hematoma of left II phalanx.  Return in 1 week for re-evaluation.  Patient educated on alarm signs/symptoms and when to proceed to the ER.

## 2013-10-30 ENCOUNTER — Encounter: Payer: Self-pay | Admitting: Family

## 2013-10-30 ENCOUNTER — Ambulatory Visit (INDEPENDENT_AMBULATORY_CARE_PROVIDER_SITE_OTHER): Payer: BC Managed Care – HMO | Admitting: Family

## 2013-10-30 VITALS — BP 108/68 | HR 61 | Temp 98.2°F | Resp 16 | Ht 74.0 in | Wt 304.0 lb

## 2013-10-30 DIAGNOSIS — S90829A Blister (nonthermal), unspecified foot, initial encounter: Secondary | ICD-10-CM

## 2013-10-30 DIAGNOSIS — IMO0002 Reserved for concepts with insufficient information to code with codable children: Secondary | ICD-10-CM

## 2013-10-30 NOTE — Assessment & Plan Note (Addendum)
Continues to heal well. Continue daily dressing changes, follow up in 2 weeks.

## 2013-10-30 NOTE — Patient Instructions (Signed)
Continue daily wound dressing changes. Follow up with Dr. Charlett Blake in 2 weeks.

## 2013-10-30 NOTE — Progress Notes (Signed)
Subjective:    Patient ID: [name], male    DOB: 1940-10-26, 73 y.o.   MRN: [id number]  HPI  [name] is a 73 yr old male who presents today with chief complaint of  Hit right great toe on a walker trying ot get out of bed. Original injury was 10/17/13 evening. Went to ER Thursday night.  Was given abx.    Developed a water blister on the top of his right foot.  Saw [facility].  Took abx prescribed by ED (bactrim).  He reports that his wound is improving.    Review of Systems    see HPI  Past Medical History  Diagnosis Date  . Valvular heart disease   . Aortic stenosis   . Osteoarthritis   . Anemia   . OSA (obstructive sleep apnea)   . Bursitis   . Gout   . Arthritis   . Hyperlipidemia   . Morbid obesity   . DDD (degenerative disc disease)   . Mixed sensory-motor polyneuropathy   . Peroneal palsy     significant right foot drop  . Hemorrhoids     hx of bleeding  . Hypertension   . History of shingles   . Measles as a child  . Chicken pox as a child    History   Social History  . Marital Status: Married    Spouse Name: N/A    Number of Children: N/A  . Years of Education: N/A   Occupational History  . retired Administrator    Social History Main Topics  . Smoking status: Former Smoker    Types: Cigarettes, Pipe    Quit date: 09/20/2006  . Smokeless tobacco: Not on file  . Alcohol Use: No  . Drug Use: No  . Sexual Activity: Not on file   Other Topics Concern  . Not on file   Social History Narrative  . No narrative on file    Past Surgical History  Procedure Laterality Date  . Total knee arthroplasty      right  . Spine surgery    . Laminotomy  1193    c6-t2  . Hernia repair      umbilical hernia  . Knee surgery Right     multiple knee surgeries due to complication of R TKA  . Colonoscopy N/A 02/07/2013    Procedure: COLONOSCOPY;  Surgeon: Juanita Craver, MD;  Location: [facility] ENDOSCOPY;  Service: Endoscopy;  Laterality: N/A;  .  Esophagogastroduodenoscopy N/A 02/07/2013    Procedure: ESOPHAGOGASTRODUODENOSCOPY (EGD);  Surgeon: Juanita Craver, MD;  Location: [facility] ENDOSCOPY;  Service: Endoscopy;  Laterality: N/A;  . Colonoscopy N/A 02/09/2013    Procedure: COLONOSCOPY;  Surgeon: Beryle Beams, MD;  Location: [facility];  Service: Endoscopy;  Laterality: N/A;  . Givens capsule study N/A 02/09/2013    Procedure: GIVENS CAPSULE STUDY;  Surgeon: Beryle Beams, MD;  Location: [facility];  Service: Endoscopy;  Laterality: N/A;    Family History  Problem Relation Age of Onset  . Arthritis Sister     "crippling"  . Arthritis Sister     No Known Allergies  Current Outpatient Prescriptions on File Prior to Visit  Medication Sig Dispense Refill  . albuterol (PROVENTIL HFA;VENTOLIN HFA) 108 (90 BASE) MCG/ACT inhaler Inhale 2 puffs into the lungs every 6 (six) hours as needed for wheezing.  1 Inhaler  2  . allopurinol (ZYLOPRIM) 100 MG tablet TAKE 1 TABLET BY MOUTH DAILY  30 tablet  3  .  atorvastatin (LIPITOR) 40 MG tablet Take 40 mg by mouth daily.        . cholecalciferol (VITAMIN D) 1000 UNITS tablet Take 2,000 Units by mouth daily.       [name] docusate sodium (COLACE) 100 MG capsule Take 300 mg by mouth daily.      . fluticasone (CUTIVATE) 0.05 % cream Apply topically 2 (two) times daily. Apply to lower legs twice daily as needed  60 g  0  . furosemide (LASIX) 20 MG tablet 1 tab po qd prn increased swelling of lower legs  30 tablet  0  . gabapentin (NEURONTIN) 300 MG capsule Take 1 capsule (300 mg total) by mouth 3 (three) times daily.  90 capsule  3  . lidocaine (LIDODERM) 5 % Place 1 patch onto the skin daily as needed (wrist pain). Remove & Discard patch within 12 hours or as directed by MD  30 patch  0  . lisinopril (PRINIVIL,ZESTRIL) 10 MG tablet Take 10 mg by mouth daily.       . Multiple Vitamin (MULTIVITAMIN WITH MINERALS) TABS Take 1 tablet by mouth daily.      [name] OVER THE COUNTER MEDICATION Vegetarian shellfish free 1  capsule tid      . oxyCODONE-acetaminophen (PERCOCET) 10-325 MG per tablet Take 1 tablet by mouth 3 (three) times daily as needed for pain.      . polyethylene glycol (MIRALAX / GLYCOLAX) packet Take 17 g by mouth 2 (two) times daily.  14 each  0  . sennosides-docusate sodium (SENOKOT-S) 8.6-50 MG tablet Take 1 tablet by mouth 2 (two) times daily.      [name] sulfamethoxazole-trimethoprim (BACTRIM DS,SEPTRA DS) 800-160 MG per tablet Take 2 tablets by mouth 2 (two) times daily. For 10 days.       No current facility-administered medications on file prior to visit.    BP 108/68  Pulse 61  Temp(Src) 98.2 F (36.8 C) (Oral)  Resp 16  Ht 6\' 2"  (1.88 m)  Wt 304 lb (137.893 kg)  BMI 39.01 kg/m2  SpO2 96%    Objective:   Physical Exam  Constitutional: He is oriented to person, place, and time. He appears well-developed and well-nourished. No distress.  Neurological: He is alert and oriented to person, place, and time.  Skin:  R dorsal great toe with scabbing.  Slight scabbing to right second toe.  No toe swelling noted. No significant erythema is noted.   Psychiatric: He has a normal mood and affect. His behavior is normal. Judgment and thought content normal.          Assessment & Plan:

## 2013-10-30 NOTE — Progress Notes (Signed)
Pre visit review using our clinic review tool, if applicable. No additional management support is needed unless otherwise documented below in the visit note. 

## 2013-11-09 ENCOUNTER — Telehealth: Payer: Self-pay | Admitting: *Deleted

## 2013-11-09 DIAGNOSIS — R062 Wheezing: Secondary | ICD-10-CM

## 2013-11-09 DIAGNOSIS — R52 Pain, unspecified: Secondary | ICD-10-CM

## 2013-11-09 MED ORDER — GABAPENTIN 300 MG PO CAPS: 300.0000 mg | ORAL_CAPSULE | Freq: Three times a day (TID) | ORAL | Status: DC

## 2013-11-09 MED ORDER — LISINOPRIL 10 MG PO TABS: 10.0000 mg | ORAL_TABLET | Freq: Every day | ORAL | Status: DC

## 2013-11-09 MED ORDER — ALBUTEROL SULFATE HFA 108 (90 BASE) MCG/ACT IN AERS: 2.0000 | INHALATION_SPRAY | Freq: Four times a day (QID) | RESPIRATORY_TRACT | Status: DC | PRN

## 2013-11-09 MED ORDER — ATORVASTATIN CALCIUM 40 MG PO TABS
40.0000 mg | ORAL_TABLET | Freq: Every day | ORAL | Status: DC
Start: 2013-11-09 — End: 2014-03-15

## 2013-11-09 MED ORDER — ALLOPURINOL 100 MG PO TABS: ORAL_TABLET | ORAL | Status: DC

## 2013-11-09 NOTE — Telephone Encounter (Signed)
Rx request to pharmacy/SLS  

## 2013-11-13 ENCOUNTER — Ambulatory Visit: Payer: BC Managed Care – HMO | Admitting: Family

## 2013-11-22 ENCOUNTER — Other Ambulatory Visit: Payer: Self-pay | Admitting: Family Medicine

## 2013-12-29 ENCOUNTER — Encounter: Payer: Self-pay | Admitting: Family Medicine

## 2013-12-31 MED ORDER — ALLOPURINOL 100 MG PO TABS: ORAL_TABLET | ORAL | Status: DC

## 2014-01-10 ENCOUNTER — Ambulatory Visit: Payer: BC Managed Care – HMO | Admitting: Family Medicine

## 2014-01-14 ENCOUNTER — Encounter: Payer: Self-pay | Admitting: Cardiovascular Disease

## 2014-01-14 ENCOUNTER — Ambulatory Visit (INDEPENDENT_AMBULATORY_CARE_PROVIDER_SITE_OTHER): Payer: BC Managed Care – HMO | Admitting: Cardiovascular Disease

## 2014-01-14 VITALS — BP 130/66 | HR 70 | Ht 74.0 in

## 2014-01-14 DIAGNOSIS — G4733 Obstructive sleep apnea (adult) (pediatric): Secondary | ICD-10-CM

## 2014-01-14 DIAGNOSIS — Q2733 Arteriovenous malformation of digestive system vessel: Secondary | ICD-10-CM

## 2014-01-14 DIAGNOSIS — K5521 Angiodysplasia of colon with hemorrhage: Secondary | ICD-10-CM

## 2014-01-14 DIAGNOSIS — I359 Nonrheumatic aortic valve disorder, unspecified: Secondary | ICD-10-CM

## 2014-01-14 DIAGNOSIS — I8310 Varicose veins of unspecified lower extremity with inflammation: Secondary | ICD-10-CM

## 2014-01-14 DIAGNOSIS — I872 Venous insufficiency (chronic) (peripheral): Secondary | ICD-10-CM

## 2014-01-14 NOTE — Progress Notes (Signed)
Patient ID: [name], male   DOB: [birth year], 73 y.o.   MRN: [id number]   73 yo with chronic venous insfu on lasix 40mg  alt with 20 mg, HTN and elevated lipids  Last seen in 2011 for AS  He is morbidly obese and mostly in a wheel chair.  5/14 was hospitalized for 16 unit bleed  Source not clear but had upper/lower endoscopy and capsule.  During hospitalization has ? 6 second pause vs leads coming off telemetry  Patient Indicates he was fine. His Aortic Valve cannot be seen well by TTE.  I last did TEE on him in 2011  Severe LVH peak gradient 97 mmHg  Mean gradient 33mmHg EF normal moderate AR Trivial MR Had 50ug fentanyl and 6 mg versed.    He has not had any dyspnea, chest pain or syncope  Has RLE foot drop from previous surgery.  Had long rehab after GI bleed just to be able to use walker again.  Discussed need for f/u TEE and need for anesthesia to be present to monitor airway.  He prefers to wait and not take any risks or have more procedures as he is still trying to recover from everything done last hospitalization.  We also discussed the newer Rx modality of TAVR which I think we should move toward .  The valve may be hard to size by CT as well as getting his peripheral vessels sized     ROS: Denies fever, malais, weight loss, blurry vision, decreased visual acuity, cough, sputum, SOB, hemoptysis, pleuritic pain, palpitaitons, heartburn, abdominal pain, melena, lower extremity edema, claudication, or rash.  All other systems reviewed and negative   General: Affect appropriate Obese white male in wheel chair  HEENT: normal Neck supple with no adenopathy JVP normal no bruits no thyromegaly Lungs clear with no wheezing and good diaphragmatic motion Heart:  S1/S2 AS/AR murmur,rub, gallop or click PMI normal Abdomen: benighn, BS positve, no tenderness, no AAA no bruit.  No HSM or HJR Distal pulses intact with no bruits Plus 2-3 chronic edema with venous stasis  Neuro non-focal Skin  warm and dry No muscular weakness  Medications Current Outpatient Prescriptions  Medication Sig Dispense Refill  . albuterol (PROVENTIL HFA;VENTOLIN HFA) 108 (90 BASE) MCG/ACT inhaler Inhale 2 puffs into the lungs every 6 (six) hours as needed for wheezing.  3 Inhaler  0  . allopurinol (ZYLOPRIM) 100 MG tablet TAKE 1 TABLET BY MOUTH DAILY  90 tablet  1  . atorvastatin (LIPITOR) 40 MG tablet Take 1 tablet (40 mg total) by mouth daily.  90 tablet  0  . cholecalciferol (VITAMIN D) 1000 UNITS tablet Take 2,000 Units by mouth daily.       [name] docusate sodium (COLACE) 100 MG capsule Take 300 mg by mouth daily.      . fluticasone (CUTIVATE) 0.05 % cream Apply topically 2 (two) times daily. Apply to lower legs twice daily as needed  60 g  0  . furosemide (LASIX) 20 MG tablet 1 tab po qd prn increased swelling of lower legs  30 tablet  0  . gabapentin (NEURONTIN) 300 MG capsule Take 1 capsule (300 mg total) by mouth 3 (three) times daily.  270 capsule  0  . lidocaine (LIDODERM) 5 % Place 1 patch onto the skin daily as needed (wrist pain). Remove & Discard patch within 12 hours or as directed by MD  30 patch  0  . lisinopril (PRINIVIL,ZESTRIL) 10 MG tablet Take 1 tablet (  10 mg total) by mouth daily.  90 tablet  0  . Multiple Vitamin (MULTIVITAMIN WITH MINERALS) TABS Take 1 tablet by mouth daily.      [name] OVER THE COUNTER MEDICATION Vegetarian shellfish free 1 capsule tid      . oxyCODONE-acetaminophen (PERCOCET) 10-325 MG per tablet Take 1 tablet by mouth 3 (three) times daily as needed for pain.      . polyethylene glycol (MIRALAX / GLYCOLAX) packet Take 17 g by mouth 2 (two) times daily.  14 each  0  . sennosides-docusate sodium (SENOKOT-S) 8.6-50 MG tablet Take 1 tablet by mouth 2 (two) times daily.      [name] sulfamethoxazole-trimethoprim (BACTRIM DS,SEPTRA DS) 800-160 MG per tablet Take 2 tablets by mouth 2 (two) times daily. For 10 days.       No current facility-administered medications for this visit.      Allergies Review of patient's allergies indicates no known allergies.  Family History: Family History  Problem Relation Age of Onset  . Arthritis Sister     "crippling"  . Arthritis Sister     Social History: History   Social History  . Marital Status: Married    Spouse Name: N/A    Number of Children: N/A  . Years of Education: N/A   Occupational History  . retired Administrator    Social History Main Topics  . Smoking status: Former Smoker    Types: Cigarettes, Pipe    Quit date: 09/20/2006  . Smokeless tobacco: Not on file  . Alcohol Use: No  . Drug Use: No  . Sexual Activity: Not on file   Other Topics Concern  . Not on file   Social History Narrative  . No narrative on file    Electrocardiogram:  SR PR 222 first degree ICRBBB 02/17/13  Today SR rate 70 PR 218 RBBB   Assessment and Plan

## 2014-01-14 NOTE — Assessment & Plan Note (Signed)
Hct stable f/u Dr Benson Norway likely AVM bleed given AS

## 2014-01-14 NOTE — Assessment & Plan Note (Signed)
Continue CPAP  This and size would benefit form anesthesia being present for any TEE

## 2014-01-14 NOTE — Assessment & Plan Note (Signed)
Continue lasix f/u primary no skin break down at this time Hard for him to get his legs elevated

## 2014-01-14 NOTE — Assessment & Plan Note (Signed)
Difficult situation Asymptomatic but no functional capacity in wheel chair.  Not likely a candidate for AVR  Would like to do TEE and then CTls and refer for TAVR evaluation but patient would like to f/u in 6 months given everything he has been through with rehab and GI bleed  He understands there is some risk involved but he has not had pre syncope, chest pain or dyspnea

## 2014-01-15 ENCOUNTER — Ambulatory Visit (INDEPENDENT_AMBULATORY_CARE_PROVIDER_SITE_OTHER): Payer: BC Managed Care – HMO | Admitting: Family Medicine

## 2014-01-15 ENCOUNTER — Encounter: Payer: Self-pay | Admitting: Family Medicine

## 2014-01-15 VITALS — BP 122/82 | HR 67 | Temp 98.2°F | Ht 74.0 in | Wt 362.0 lb

## 2014-01-15 DIAGNOSIS — K922 Gastrointestinal hemorrhage, unspecified: Secondary | ICD-10-CM

## 2014-01-15 DIAGNOSIS — M109 Gout, unspecified: Secondary | ICD-10-CM

## 2014-01-15 DIAGNOSIS — R52 Pain, unspecified: Secondary | ICD-10-CM

## 2014-01-15 DIAGNOSIS — E7800 Pure hypercholesterolemia, unspecified: Secondary | ICD-10-CM

## 2014-01-15 DIAGNOSIS — E785 Hyperlipidemia, unspecified: Secondary | ICD-10-CM

## 2014-01-15 DIAGNOSIS — I10 Essential (primary) hypertension: Secondary | ICD-10-CM

## 2014-01-15 LAB — CBC
HCT: 37.4 % — ABNORMAL LOW (ref 39.0–52.0)
Hemoglobin: 12 g/dL — ABNORMAL LOW (ref 13.0–17.0)
MCH: 28.5 pg (ref 26.0–34.0)
MCHC: 32.1 g/dL (ref 30.0–36.0)
MCV: 88.8 fL (ref 78.0–100.0)
Platelets: 172 10*3/uL (ref 150–400)
RBC: 4.21 MIL/uL — ABNORMAL LOW (ref 4.22–5.81)
RDW: 16.1 % — ABNORMAL HIGH (ref 11.5–15.5)
WBC: 7.2 10*3/uL (ref 4.0–10.5)

## 2014-01-15 LAB — LIPID PANEL
Cholesterol: 118 mg/dL (ref 0–200)
HDL: 30 mg/dL — ABNORMAL LOW (ref >39)
LDL Cholesterol: 64 mg/dL (ref 0–99)
Total CHOL/HDL Ratio: 3.9 Ratio
Triglycerides: 120 mg/dL (ref <150)
VLDL: 24 mg/dL (ref 0–40)

## 2014-01-15 LAB — RENAL FUNCTION PANEL
Albumin: 3.6 g/dL (ref 3.5–5.2)
BUN: 18 mg/dL (ref 6–23)
CO2: 27 mEq/L (ref 19–32)
Calcium: 9.1 mg/dL (ref 8.4–10.5)
Chloride: 103 mEq/L (ref 96–112)
Creat: 0.81 mg/dL (ref 0.50–1.35)
Glucose, Bld: 81 mg/dL (ref 70–99)
Phosphorus: 4.3 mg/dL (ref 2.3–4.6)
Potassium: 5 mEq/L (ref 3.5–5.3)
Sodium: 137 mEq/L (ref 135–145)

## 2014-01-15 LAB — URIC ACID: Uric Acid, Serum: 7.2 mg/dL (ref 4.0–7.8)

## 2014-01-15 LAB — HEPATIC FUNCTION PANEL
ALT: 19 U/L (ref 0–53)
AST: 19 U/L (ref 0–37)
Albumin: 3.6 g/dL (ref 3.5–5.2)
Alkaline Phosphatase: 73 U/L (ref 39–117)
Bilirubin, Direct: 0.1 mg/dL (ref 0.0–0.3)
Indirect Bilirubin: 0.4 mg/dL (ref 0.2–1.2)
Total Bilirubin: 0.5 mg/dL (ref 0.2–1.2)
Total Protein: 5.7 g/dL — ABNORMAL LOW (ref 6.0–8.3)

## 2014-01-15 MED ORDER — TRAMADOL HCL 50 MG PO TABS: 50.0000 mg | ORAL_TABLET | Freq: Three times a day (TID) | ORAL | Status: DC | PRN

## 2014-01-15 NOTE — Progress Notes (Signed)
Pre visit review using our clinic review tool, if applicable. No additional management support is needed unless otherwise documented below in the visit note. 

## 2014-01-15 NOTE — Patient Instructions (Signed)
Osteoarthritis Osteoarthritis is a disease that causes soreness and swelling (inflammation) of a joint. It occurs when the cartilage at the affected joint wears down. Cartilage acts as a cushion, covering the ends of bones where they meet to form a joint. Osteoarthritis is the most common form of arthritis. It often occurs in older people. The joints affected most often by this condition include those in the:  Ends of the fingers.  Thumbs.  Neck.  Lower back.  Knees.  Hips. CAUSES  Over time, the cartilage that covers the ends of bones begins to wear away. This causes bone to rub on bone, producing pain and stiffness in the affected joints.  RISK FACTORS Certain factors can increase your chances of having osteoarthritis, including:  Older age.  Excessive body weight.  Overuse of joints. SIGNS AND SYMPTOMS   Pain, swelling, and stiffness in the joint.  Over time, the joint may lose its normal shape.  Small deposits of bone (osteophytes) may grow on the edges of the joint.  Bits of bone or cartilage can break off and float inside the joint space. This may cause more pain and damage. DIAGNOSIS  Your health care provider will do a physical exam and ask about your symptoms. Various tests may be ordered, such as:  X-rays of the affected joint.  An MRI scan.  Blood tests to rule out other types of arthritis.  Joint fluid tests. This involves using a needle to draw fluid from the joint and examining the fluid under a microscope. TREATMENT  Goals of treatment are to control pain and improve joint function. Treatment plans may include:  A prescribed exercise program that allows for rest and joint relief.  A weight control plan.  Pain relief techniques, such as:  Properly applied heat and cold.  Electric pulses delivered to nerve endings under the skin (transcutaneous electrical nerve stimulation, TENS).  Massage.  Certain nutritional supplements.  Medicines to  control pain, such as:  Acetaminophen.  Nonsteroidal anti-inflammatory drugs (NSAIDs), such as naproxen.  Narcotic or central-acting agents, such as tramadol.  Corticosteroids. These can be given orally or as an injection.  Surgery to reposition the bones and relieve pain (osteotomy) or to remove loose pieces of bone and cartilage. Joint replacement may be needed in advanced states of osteoarthritis. HOME CARE INSTRUCTIONS   Only take over-the-counter or prescription medicines as directed by your health care provider. Take all medicines exactly as instructed.  Maintain a healthy weight. Follow your health care provider's instructions for weight control. This may include dietary instructions.  Exercise as directed. Your health care provider can recommend specific types of exercise. These may include:  Strengthening exercises These are done to strengthen the muscles that support joints affected by arthritis. They can be performed with weights or with exercise bands to add resistance.  Aerobic activities These are exercises, such as brisk walking or low-impact aerobics, that get your heart pumping.  Range-of-motion activities These keep your joints limber.  Balance and agility exercises These help you maintain daily living skills.  Rest your affected joints as directed by your health care provider.  Follow up with your health care provider as directed. SEEK MEDICAL CARE IF:   Your skin turns red.  You develop a rash in addition to your joint pain.  You have worsening joint pain. SEEK IMMEDIATE MEDICAL CARE IF:  You have a significant loss of weight or appetite.  You have a fever along with joint or muscle aches.  You have   night sweats. FOR MORE INFORMATION  National Institute of Arthritis and Musculoskeletal and Skin Diseases: www.niams.nih.gov National Institute on Aging: www.nia.nih.gov American College of Rheumatology: www.rheumatology.org Document Released: 09/06/2005  Document Revised: 06/27/2013 Document Reviewed: 05/14/2013 [employer] Patient Information 2014 [employer].  

## 2014-01-16 LAB — TSH: TSH: 1.659 u[IU]/mL (ref 0.350–4.500)

## 2014-01-20 ENCOUNTER — Encounter: Payer: Self-pay | Admitting: Family Medicine

## 2014-01-20 NOTE — Assessment & Plan Note (Signed)
Continue current meds 

## 2014-01-20 NOTE — Progress Notes (Signed)
Patient ID: [name], male   DOB: 1941/02/21, 73 y.o.   MRN: [id number] JAVIER MONGILLO [id number] [birth year] 01/20/2014      Progress Note-Follow Up  Subjective  Chief Complaint  Chief Complaint  Patient presents with  . Follow-up    3 month    HPI  Patient is a 73 year old male in today for routine medical care. She is accompanied by his wife. They report he is doing well. No recent illness. Has been seen by cardiology and is undecided about whether ice to proceed with any testing. His shortness of breath is slightly improved. He continues to struggle joint pain that has become somewhat more active. He is to be somewhat better. Denies CP/palp/SOB/HA/congestion/fevers/GI or GU c/o. Taking meds as prescribed  Past Medical History  Diagnosis Date  . Valvular heart disease   . Aortic stenosis   . Osteoarthritis   . Anemia   . OSA (obstructive sleep apnea)   . Bursitis   . Gout   . Arthritis   . Hyperlipidemia   . Morbid obesity   . DDD (degenerative disc disease)   . Mixed sensory-motor polyneuropathy   . Peroneal palsy     significant right foot drop  . Hemorrhoids     hx of bleeding  . Hypertension   . History of shingles   . Measles as a child  . Chicken pox as a child    Past Surgical History  Procedure Laterality Date  . Total knee arthroplasty      right  . Spine surgery    . Laminotomy  1193    c6-t2  . Hernia repair      umbilical hernia  . Knee surgery Right     multiple knee surgeries due to complication of R TKA  . Colonoscopy N/A 02/07/2013    Procedure: COLONOSCOPY;  Surgeon: Juanita Craver, MD;  Location: [facility] ENDOSCOPY;  Service: Endoscopy;  Laterality: N/A;  . Esophagogastroduodenoscopy N/A 02/07/2013    Procedure: ESOPHAGOGASTRODUODENOSCOPY (EGD);  Surgeon: Juanita Craver, MD;  Location: [facility] ENDOSCOPY;  Service: Endoscopy;  Laterality: N/A;  . Colonoscopy N/A 02/09/2013    Procedure: COLONOSCOPY;  Surgeon: Beryle Beams, MD;  Location: [place];   Service: Endoscopy;  Laterality: N/A;  . Givens capsule study N/A 02/09/2013    Procedure: GIVENS CAPSULE STUDY;  Surgeon: Beryle Beams, MD;  Location: [place];  Service: Endoscopy;  Laterality: N/A;    Family History  Problem Relation Age of Onset  . Arthritis Sister     "crippling"  . Arthritis Sister     History   Social History  . Marital Status: Married    Spouse Name: N/A    Number of Children: N/A  . Years of Education: N/A   Occupational History  . retired Administrator    Social History Main Topics  . Smoking status: Former Smoker    Types: Cigarettes, Pipe    Quit date: 09/20/2006  . Smokeless tobacco: Not on file  . Alcohol Use: No  . Drug Use: No  . Sexual Activity: Not on file   Other Topics Concern  . Not on file   Social History Narrative  . No narrative on file    Current Outpatient Prescriptions on File Prior to Visit  Medication Sig Dispense Refill  . allopurinol (ZYLOPRIM) 100 MG tablet TAKE 1 TABLET BY MOUTH DAILY  90 tablet  1  . atorvastatin (LIPITOR) 40 MG tablet Take 1 tablet (40 mg  total) by mouth daily.  90 tablet  0  . cholecalciferol (VITAMIN D) 1000 UNITS tablet Take 2,000 Units by mouth daily.       [name] co-enzyme Q-10 30 MG capsule Take 200 mg by mouth 3 (three) times daily.      . fluticasone (CUTIVATE) 0.05 % cream Apply topically 2 (two) times daily. Apply to lower legs twice daily as needed  60 g  0  . gabapentin (NEURONTIN) 300 MG capsule Take 1 capsule (300 mg total) by mouth 3 (three) times daily.  270 capsule  0  . lisinopril (PRINIVIL,ZESTRIL) 10 MG tablet Take 1 tablet (10 mg total) by mouth daily.  90 tablet  0  . Multiple Vitamin (MULTIVITAMIN WITH MINERALS) TABS Take 1 tablet by mouth daily.      . sennosides-docusate sodium (SENOKOT-S) 8.6-50 MG tablet Take 1 tablet by mouth 2 (two) times daily.       No current facility-administered medications on file prior to visit.    No Known Allergies  Review of  Systems  Review of Systems  Constitutional: Negative for fever and malaise/fatigue.  HENT: Negative for congestion.   Eyes: Negative for discharge.  Respiratory: Negative for shortness of breath.   Cardiovascular: Negative for chest pain, palpitations and leg swelling.  Gastrointestinal: Negative for nausea, abdominal pain and diarrhea.  Genitourinary: Negative for dysuria.  Musculoskeletal: Negative for falls.  Skin: Negative for rash.  Neurological: Negative for loss of consciousness and headaches.  Endo/Heme/Allergies: Negative for polydipsia.  Psychiatric/Behavioral: Negative for depression and suicidal ideas. The patient is not nervous/anxious and does not have insomnia.     Objective  BP 122/82  Pulse 67  Temp(Src) 98.2 F (36.8 C) (Oral)  Ht 6\' 2"  (1.88 m)  Wt 362 lb (164.202 kg)  BMI 46.46 kg/m2  SpO2 95%  Physical Exam  Physical Exam  Constitutional: He is oriented to person, place, and time and well-developed, well-nourished, and in no distress. No distress.  obesity  HENT:  Head: Normocephalic and atraumatic.  Eyes: Conjunctivae are normal.  Neck: Neck supple. No thyromegaly present.  Cardiovascular: Normal rate, regular rhythm and normal heart sounds.   No murmur heard. Pulmonary/Chest: Effort normal and breath sounds normal. No respiratory distress.  Abdominal: He exhibits no distension and no mass. There is no tenderness.  Musculoskeletal: He exhibits no edema.  Neurological: He is alert and oriented to person, place, and time.  Skin: Skin is warm.  Psychiatric: Memory, affect and judgment normal.    Lab Results  Component Value Date   TSH 1.659 01/15/2014   Lab Results  Component Value Date   WBC 7.2 01/15/2014   HGB 12.0* 01/15/2014   HCT 37.4* 01/15/2014   MCV 88.8 01/15/2014   PLT 172 01/15/2014   Lab Results  Component Value Date   CREATININE 0.81 01/15/2014   BUN 18 01/15/2014   NA 137 01/15/2014   K 5.0 01/15/2014   CL 103 01/15/2014   CO2  27 01/15/2014   Lab Results  Component Value Date   ALT 19 01/15/2014   AST 19 01/15/2014   ALKPHOS 73 01/15/2014   BILITOT 0.5 01/15/2014   Lab Results  Component Value Date   CHOL 118 01/15/2014   Lab Results  Component Value Date   HDL 30* 01/15/2014   Lab Results  Component Value Date   LDLCALC 64 01/15/2014   Lab Results  Component Value Date   TRIG 120 01/15/2014   Lab Results  Component Value  Date   CHOLHDL 3.9 01/15/2014     Assessment & Plan  GOUT Continue current meds  OBESITY, MORBID Encouraged DASH diet, decrease po intake and increase exercise as tolerated. Needs 7-8 hours of sleep nightly. Avoid trans fats, eat small, frequent meals every 4-5 hours with lean proteins, complex carbs and healthy fats. Minimize simple carbs, GMO foods.  Acute lower gastrointestinal bleeding Doing better, no recent episodes  HYPERCHOLESTEROLEMIA Tolerating statin, encouraged heart healthy diet, avoid trans fats, minimize simple carbs and saturated fats. Increase exercise as tolerated

## 2014-01-20 NOTE — Assessment & Plan Note (Signed)
Doing better, no recent episodes

## 2014-01-20 NOTE — Assessment & Plan Note (Signed)
Encouraged DASH diet, decrease po intake and increase exercise as tolerated. Needs 7-8 hours of sleep nightly. Avoid trans fats, eat small, frequent meals every 4-5 hours with lean proteins, complex carbs and healthy fats. Minimize simple carbs, GMO foods. 

## 2014-01-20 NOTE — Assessment & Plan Note (Signed)
Tolerating statin, encouraged heart healthy diet, avoid trans fats, minimize simple carbs and saturated fats. Increase exercise as tolerated 

## 2014-02-04 ENCOUNTER — Encounter: Payer: Self-pay | Admitting: Cardiovascular Disease

## 2014-03-14 ENCOUNTER — Other Ambulatory Visit: Payer: Self-pay | Admitting: Family Medicine

## 2014-03-14 DIAGNOSIS — R52 Pain, unspecified: Secondary | ICD-10-CM

## 2014-03-15 MED ORDER — GABAPENTIN 300 MG PO CAPS: 300.0000 mg | ORAL_CAPSULE | Freq: Three times a day (TID) | ORAL | Status: DC

## 2014-03-15 MED ORDER — LISINOPRIL 10 MG PO TABS: 10.0000 mg | ORAL_TABLET | Freq: Every day | ORAL | Status: DC

## 2014-03-15 MED ORDER — ATORVASTATIN CALCIUM 40 MG PO TABS: 40.0000 mg | ORAL_TABLET | Freq: Every day | ORAL | Status: DC

## 2014-04-05 ENCOUNTER — Other Ambulatory Visit: Payer: Self-pay | Admitting: Family Medicine

## 2014-04-05 NOTE — Telephone Encounter (Signed)
Rx faxed to pharmacy  

## 2014-04-05 NOTE — Telephone Encounter (Signed)
Rx printed and forwarded to Provider for signature.  Medication name:  Name from pharmacy:  traMADol (ULTRAM) 50 MG tablet  TRAMADOL HCL 50 MG TABLET Sig: TAKE 1 TABLET BY MOUTH EVERY 8 HOURS DAILY AS NEEDED FOR PAIN Dispense: 90 tablet Refills: 0 Start: 04/05/2014 Class: Normal Notes to pharmacy: Not to exceed 5 additional fills before 07/07/2014 Requested on: 01/08/2014 Originally ordered on: 01/14/2014 Last refill: 01/08/2014

## 2014-04-12 ENCOUNTER — Telehealth: Payer: Self-pay | Admitting: Family Medicine

## 2014-04-12 NOTE — Telephone Encounter (Signed)
Wife of patient left message stating that the last few times the patient has gotten his Gabapentin refilled we have only given him 27 pills and he used to get more since he takes the medication 3x daily, would like the script corrected

## 2014-04-15 NOTE — Telephone Encounter (Signed)
I called and spoke to patients spouse and informed her that we sent in 270 tablets to [facility]. pts spouse states she talked to the pharmacy on Friday and saw where it was supposed to be 270 and they were sending this to the patient

## 2014-04-19 ENCOUNTER — Ambulatory Visit (INDEPENDENT_AMBULATORY_CARE_PROVIDER_SITE_OTHER): Payer: BC Managed Care – HMO | Admitting: Family Medicine

## 2014-04-19 ENCOUNTER — Encounter: Payer: Self-pay | Admitting: Family Medicine

## 2014-04-19 VITALS — BP 128/80 | HR 75 | Temp 98.3°F

## 2014-04-19 DIAGNOSIS — D649 Anemia, unspecified: Secondary | ICD-10-CM

## 2014-04-19 DIAGNOSIS — M1A00X0 Idiopathic chronic gout, unspecified site, without tophus (tophi): Secondary | ICD-10-CM

## 2014-04-19 DIAGNOSIS — M129 Arthropathy, unspecified: Secondary | ICD-10-CM

## 2014-04-19 DIAGNOSIS — E7800 Pure hypercholesterolemia, unspecified: Secondary | ICD-10-CM

## 2014-04-19 DIAGNOSIS — I359 Nonrheumatic aortic valve disorder, unspecified: Secondary | ICD-10-CM

## 2014-04-19 DIAGNOSIS — I10 Essential (primary) hypertension: Secondary | ICD-10-CM

## 2014-04-19 LAB — CBC
HCT: 37.8 % — ABNORMAL LOW (ref 39.0–52.0)
Hemoglobin: 12.2 g/dL — ABNORMAL LOW (ref 13.0–17.0)
MCH: 28.1 pg (ref 26.0–34.0)
MCHC: 32.3 g/dL (ref 30.0–36.0)
MCV: 87.1 fL (ref 78.0–100.0)
Platelets: 194 10*3/uL (ref 150–400)
RBC: 4.34 MIL/uL (ref 4.22–5.81)
RDW: 16 % — ABNORMAL HIGH (ref 11.5–15.5)
WBC: 6.9 10*3/uL (ref 4.0–10.5)

## 2014-04-19 MED ORDER — TRAMADOL HCL 50 MG PO TABS: 50.0000 mg | ORAL_TABLET | Freq: Four times a day (QID) | ORAL | Status: DC | PRN

## 2014-04-19 NOTE — Assessment & Plan Note (Signed)
Tolerating statin, encouraged heart healthy diet, avoid trans fats, minimize simple carbs and saturated fats. Increase exercise as tolerated 

## 2014-04-19 NOTE — Assessment & Plan Note (Signed)
Mild, stable, repeat CBC today

## 2014-04-19 NOTE — Progress Notes (Signed)
Patient ID: [name], male   DOB: 07-Aug-1941, 73 y.o.   MRN: [id number] [name] [id number] 09-15-1941 04/19/2014      Progress Note-Follow Up  Subjective  Chief Complaint  No chief complaint on file.   HPI  Patient is a 73 year old male in today for routine medical care. Doing well. Denies CP/palp/SOB/HA/congestion/fevers/GI or GU c/o. Taking meds as prescribed. No recent illness. Has been using Tylenol 1000 mg daily with some relief one day took the dose twice daily with better results. Also gets relief from [facility]. Denies CP/palp/SOB/HA/congestion/fevers/GI or GU c/o. Taking meds as prescribed Past Medical History  Diagnosis Date  . Valvular heart disease   . Aortic stenosis   . Osteoarthritis   . Anemia   . OSA (obstructive sleep apnea)   . Bursitis   . Gout   . Arthritis   . Hyperlipidemia   . Morbid obesity   . DDD (degenerative disc disease)   . Mixed sensory-motor polyneuropathy   . Peroneal palsy     significant right foot drop  . Hemorrhoids     hx of bleeding  . Hypertension   . History of shingles   . Measles as a child  . Chicken pox as a child    Past Surgical History  Procedure Laterality Date  . Total knee arthroplasty      right  . Spine surgery    . Laminotomy  1193    c6-t2  . Hernia repair      umbilical hernia  . Knee surgery Right     multiple knee surgeries due to complication of R TKA  . Colonoscopy N/A 02/07/2013    Procedure: COLONOSCOPY;  Surgeon: Juanita Craver, MD;  Location: [facility] ENDOSCOPY;  Service: Endoscopy;  Laterality: N/A;  . Esophagogastroduodenoscopy N/A 02/07/2013    Procedure: ESOPHAGOGASTRODUODENOSCOPY (EGD);  Surgeon: Juanita Craver, MD;  Location: [facility] ENDOSCOPY;  Service: Endoscopy;  Laterality: N/A;  . Colonoscopy N/A 02/09/2013    Procedure: COLONOSCOPY;  Surgeon: Beryle Beams, MD;  Location: [place];  Service: Endoscopy;  Laterality: N/A;  . Givens capsule study N/A 02/09/2013    Procedure: GIVENS CAPSULE STUDY;   Surgeon: Beryle Beams, MD;  Location: [place];  Service: Endoscopy;  Laterality: N/A;    Family History  Problem Relation Age of Onset  . Arthritis Sister     "crippling"  . Arthritis Sister     History   Social History  . Marital Status: Married    Spouse Name: N/A    Number of Children: N/A  . Years of Education: N/A   Occupational History  . retired Administrator    Social History Main Topics  . Smoking status: Former Smoker    Types: Cigarettes, Pipe    Quit date: 09/20/2006  . Smokeless tobacco: Not on file  . Alcohol Use: No  . Drug Use: No  . Sexual Activity: Not on file   Other Topics Concern  . Not on file   Social History Narrative  . No narrative on file    Current Outpatient Prescriptions on File Prior to Visit  Medication Sig Dispense Refill  . allopurinol (ZYLOPRIM) 100 MG tablet TAKE 1 TABLET BY MOUTH DAILY  90 tablet  1  . atorvastatin (LIPITOR) 40 MG tablet Take 1 tablet (40 mg total) by mouth daily.  90 tablet  1  . cholecalciferol (VITAMIN D) 1000 UNITS tablet Take 2,000 Units by mouth daily.       [name]  co-enzyme Q-10 30 MG capsule Take 200 mg by mouth 3 (three) times daily.      . fluticasone (CUTIVATE) 0.05 % cream Apply topically 2 (two) times daily. Apply to lower legs twice daily as needed  60 g  0  . gabapentin (NEURONTIN) 300 MG capsule Take 1 capsule (300 mg total) by mouth 3 (three) times daily.  270 capsule  1  . lisinopril (PRINIVIL,ZESTRIL) 10 MG tablet Take 1 tablet (10 mg total) by mouth daily.  90 tablet  1  . Multiple Vitamin (MULTIVITAMIN WITH MINERALS) TABS Take 1 tablet by mouth daily.      . sennosides-docusate sodium (SENOKOT-S) 8.6-50 MG tablet Take 1 tablet by mouth 2 (two) times daily.      . traMADol (ULTRAM) 50 MG tablet TAKE 1 TABLET BY MOUTH EVERY 8 HOURS DAILY AS NEEDED FOR PAIN  90 tablet  0   No current facility-administered medications on file prior to visit.    No Known Allergies  Review of  Systems  Review of Systems  Constitutional: Negative for fever and malaise/fatigue.  HENT: Negative for congestion.   Eyes: Negative for discharge.  Respiratory: Negative for shortness of breath.   Cardiovascular: Negative for chest pain, palpitations and leg swelling.  Gastrointestinal: Negative for nausea, abdominal pain and diarrhea.  Genitourinary: Negative for dysuria.  Musculoskeletal: Negative for falls.  Skin: Negative for rash.  Neurological: Negative for loss of consciousness and headaches.  Endo/Heme/Allergies: Negative for polydipsia.  Psychiatric/Behavioral: Negative for depression and suicidal ideas. The patient is not nervous/anxious and does not have insomnia.     Objective  BP 128/80  Pulse 75  Temp(Src) 98.3 F (36.8 C) (Oral)  SpO2 94%  Physical Exam  Physical Exam  Constitutional: He is oriented to person, place, and time and well-developed, well-nourished, and in no distress. No distress.  Morbid obesity  HENT:  Head: Normocephalic and atraumatic.  Eyes: Conjunctivae are normal.  Neck: Neck supple. No thyromegaly present.  Cardiovascular: Normal rate and regular rhythm.   Murmur heard. Pulmonary/Chest: Effort normal and breath sounds normal. No respiratory distress.  Abdominal: He exhibits no distension and no mass. There is no tenderness.  Musculoskeletal: He exhibits no edema.  Neurological: He is alert and oriented to person, place, and time.  Skin: Skin is warm.  Psychiatric: Memory, affect and judgment normal.    Lab Results  Component Value Date   TSH 1.659 01/15/2014   Lab Results  Component Value Date   WBC 7.2 01/15/2014   HGB 12.0* 01/15/2014   HCT 37.4* 01/15/2014   MCV 88.8 01/15/2014   PLT 172 01/15/2014   Lab Results  Component Value Date   CREATININE 0.81 01/15/2014   BUN 18 01/15/2014   NA 137 01/15/2014   K 5.0 01/15/2014   CL 103 01/15/2014   CO2 27 01/15/2014   Lab Results  Component Value Date   ALT 19 01/15/2014   AST 19  01/15/2014   ALKPHOS 73 01/15/2014   BILITOT 0.5 01/15/2014   Lab Results  Component Value Date   CHOL 118 01/15/2014   Lab Results  Component Value Date   HDL 30* 01/15/2014   Lab Results  Component Value Date   LDLCALC 64 01/15/2014   Lab Results  Component Value Date   TRIG 120 01/15/2014   Lab Results  Component Value Date   CHOLHDL 3.9 01/15/2014     Assessment & Plan  ARTHRITIS Can Tylenol ES 2 tabs po bid and can  add a 3rd dose mid day as needed for pain control. Can continue to use BenGay prn  HYPERCHOLESTEROLEMIA Tolerating statin, encouraged heart healthy diet, avoid trans fats, minimize simple carbs and saturated fats. Increase exercise as tolerated  ANEMIA Mild, stable, repeat CBC today  OBESITY, MORBID Encouraged DASH diet, decrease po intake and increase exercise as tolerated. Needs 7-8 hours of sleep nightly. Avoid trans fats, eat small, frequent meals every 4-5 hours with lean proteins, complex carbs and healthy fats. Minimize simple carbs  AORTIC STENOSIS Stable, exerts himself very little but is asymptomatic

## 2014-04-19 NOTE — Assessment & Plan Note (Signed)
Encouraged DASH diet, decrease po intake and increase exercise as tolerated. Needs 7-8 hours of sleep nightly. Avoid trans fats, eat small, frequent meals every 4-5 hours with lean proteins, complex carbs and healthy fats. Minimize simple carbs 

## 2014-04-19 NOTE — Assessment & Plan Note (Signed)
Can Tylenol ES 2 tabs po bid and can add a 3rd dose mid day as needed for pain control. Can continue to use BenGay prn

## 2014-04-20 LAB — HEPATIC FUNCTION PANEL
ALT: 21 U/L (ref 0–53)
AST: 18 U/L (ref 0–37)
Albumin: 3.6 g/dL (ref 3.5–5.2)
Alkaline Phosphatase: 73 U/L (ref 39–117)
Bilirubin, Direct: 0.1 mg/dL (ref 0.0–0.3)
Indirect Bilirubin: 0.4 mg/dL (ref 0.2–1.2)
Total Bilirubin: 0.5 mg/dL (ref 0.2–1.2)
Total Protein: 6.2 g/dL (ref 6.0–8.3)

## 2014-04-20 LAB — TSH: TSH: 1.331 u[IU]/mL (ref 0.350–4.500)

## 2014-04-20 LAB — RENAL FUNCTION PANEL
Albumin: 3.6 g/dL (ref 3.5–5.2)
BUN: 19 mg/dL (ref 6–23)
CO2: 29 mEq/L (ref 19–32)
Calcium: 9.5 mg/dL (ref 8.4–10.5)
Chloride: 101 mEq/L (ref 96–112)
Creat: 0.95 mg/dL (ref 0.50–1.35)
Glucose, Bld: 90 mg/dL (ref 70–99)
Phosphorus: 4.1 mg/dL (ref 2.3–4.6)
Potassium: 5 mEq/L (ref 3.5–5.3)
Sodium: 136 mEq/L (ref 135–145)

## 2014-04-20 LAB — LIPID PANEL
Cholesterol: 119 mg/dL (ref 0–200)
HDL: 30 mg/dL — ABNORMAL LOW (ref >39)
LDL Cholesterol: 65 mg/dL (ref 0–99)
Total CHOL/HDL Ratio: 4 Ratio
Triglycerides: 119 mg/dL (ref <150)
VLDL: 24 mg/dL (ref 0–40)

## 2014-04-20 LAB — URIC ACID: Uric Acid, Serum: 7.5 mg/dL (ref 4.0–7.8)

## 2014-04-22 NOTE — Assessment & Plan Note (Signed)
Stable, exerts himself very little but is asymptomatic

## 2014-07-03 ENCOUNTER — Telehealth: Payer: Self-pay | Admitting: Family Medicine

## 2014-07-03 MED ORDER — ALLOPURINOL 100 MG PO TABS: ORAL_TABLET | ORAL | Status: DC

## 2014-07-03 NOTE — Telephone Encounter (Signed)
Caller name: Jelan  Call back number:[phone] Pharmacy:[facility]   Reason for call:  Pt would like a refill on Rx allopurinol (ZYLOPRIM) 100 MG tablet and sent to the pharmacy sent above

## 2014-07-03 NOTE — Telephone Encounter (Signed)
Allopurinol refilled per protocol. JG//CMA 

## 2014-07-05 ENCOUNTER — Other Ambulatory Visit: Payer: Self-pay

## 2014-07-19 ENCOUNTER — Ambulatory Visit: Payer: BC Managed Care – HMO | Admitting: Family Medicine

## 2014-07-29 ENCOUNTER — Ambulatory Visit (INDEPENDENT_AMBULATORY_CARE_PROVIDER_SITE_OTHER): Payer: BC Managed Care – HMO

## 2014-07-29 ENCOUNTER — Encounter: Payer: Self-pay | Admitting: Family Medicine

## 2014-07-29 ENCOUNTER — Ambulatory Visit (INDEPENDENT_AMBULATORY_CARE_PROVIDER_SITE_OTHER): Payer: BC Managed Care – HMO | Admitting: Family Medicine

## 2014-07-29 VITALS — BP 131/60 | HR 78 | Temp 98.5°F | Ht 74.0 in

## 2014-07-29 DIAGNOSIS — Z23 Encounter for immunization: Secondary | ICD-10-CM | POA: Diagnosis not present

## 2014-07-29 DIAGNOSIS — I359 Nonrheumatic aortic valve disorder, unspecified: Secondary | ICD-10-CM

## 2014-07-29 DIAGNOSIS — E7800 Pure hypercholesterolemia, unspecified: Secondary | ICD-10-CM

## 2014-07-29 DIAGNOSIS — R609 Edema, unspecified: Secondary | ICD-10-CM

## 2014-07-29 DIAGNOSIS — G4733 Obstructive sleep apnea (adult) (pediatric): Secondary | ICD-10-CM

## 2014-07-29 DIAGNOSIS — D649 Anemia, unspecified: Secondary | ICD-10-CM

## 2014-07-29 DIAGNOSIS — M109 Gout, unspecified: Secondary | ICD-10-CM

## 2014-07-29 NOTE — Assessment & Plan Note (Signed)
Improved significantly, will recheck CBC

## 2014-07-29 NOTE — Assessment & Plan Note (Signed)
Asymptomatic, patient very inactive. Encouraged to proceed with repeat echocardiogram but patient declines.

## 2014-07-29 NOTE — Progress Notes (Signed)
..  lb

## 2014-07-29 NOTE — Assessment & Plan Note (Signed)
Using CPAP routinely continue the same

## 2014-07-29 NOTE — Patient Instructions (Signed)
Consider cooking in cast iron pans  Anemia, Nonspecific Anemia is a condition in which the concentration of red blood cells or hemoglobin in the blood is below normal. Hemoglobin is a substance in red blood cells that carries oxygen to the tissues of the body. Anemia results in not enough oxygen reaching these tissues.  CAUSES  Common causes of anemia include:   Excessive bleeding. Bleeding may be internal or external. This includes excessive bleeding from periods (in women) or from the intestine.   Poor nutrition.   Chronic kidney, thyroid, and liver disease.   Bone marrow disorders that decrease red blood cell production.  Cancer and treatments for cancer.  HIV, AIDS, and their treatments.  Spleen problems that increase red blood cell destruction.  Blood disorders.  Excess destruction of red blood cells due to infection, medicines, and autoimmune disorders. SIGNS AND SYMPTOMS   Minor weakness.   Dizziness.   Headache.  Palpitations.   Shortness of breath, especially with exercise.   Paleness.  Cold sensitivity.  Indigestion.  Nausea.  Difficulty sleeping.  Difficulty concentrating. Symptoms may occur suddenly or they may develop slowly.  DIAGNOSIS  Additional blood tests are often needed. These help your health care provider determine the best treatment. Your health care provider will check your stool for blood and look for other causes of blood loss.  TREATMENT  Treatment varies depending on the cause of the anemia. Treatment can include:   Supplements of iron, vitamin 123456, or folic acid.   Hormone medicines.   A blood transfusion. This may be needed if blood loss is severe.   Hospitalization. This may be needed if there is significant continual blood loss.   Dietary changes.  Spleen removal. HOME CARE INSTRUCTIONS Keep all follow-up appointments. It often takes many weeks to correct anemia, and having your health care provider check on  your condition and your response to treatment is very important. SEEK IMMEDIATE MEDICAL CARE IF:   You develop extreme weakness, shortness of breath, or chest pain.   You become dizzy or have trouble concentrating.  You develop heavy vaginal bleeding.   You develop a rash.   You have bloody or black, tarry stools.   You faint.   You vomit up blood.   You vomit repeatedly.   You have abdominal pain.  You have a fever or persistent symptoms for more than 2-3 days.   You have a fever and your symptoms suddenly get worse.   You are dehydrated.  MAKE SURE YOU:  Understand these instructions.  Will watch your condition.  Will get help right away if you are not doing well or get worse. Document Released: 10/14/2004 Document Revised: 05/09/2013 Document Reviewed: 03/02/2013 [facility] Patient Information 2015 [place], [place]. This information is not intended to replace advice given to you by your health care provider. Make sure you discuss any questions you have with your health care provider.

## 2014-07-29 NOTE — Assessment & Plan Note (Signed)
Tolerating statin, encouraged heart healthy diet, avoid trans fats, minimize simple carbs and saturated fats. Increase exercise as tolerated 

## 2014-07-29 NOTE — Progress Notes (Signed)
Patient ID: [name], male   DOB: 1941/01/19, 73 y.o.   MRN: [id number] [name] [id number] [birth year] 07/29/2014      Progress Note-Follow Up  Subjective  Chief Complaint  Chief Complaint  Patient presents with  . Follow-up    3 month  . Injections    flu    HPI  Patient is a 73 year old male in today for routine medical care. In today accompanied by his wife. He is doing well. No recent illness. They just got back from a trip out of state for a vacation and he tolerated the travel well. No recent illness. Denies CP/palp/SOB/HA/congestion/fevers/GI or GU c/o. Taking meds as prescribed  Past Medical History  Diagnosis Date  . Valvular heart disease   . Aortic stenosis   . Osteoarthritis   . Anemia   . OSA (obstructive sleep apnea)   . Bursitis   . Gout   . Arthritis   . Hyperlipidemia   . Morbid obesity   . DDD (degenerative disc disease)   . Mixed sensory-motor polyneuropathy   . Peroneal palsy     significant right foot drop  . Hemorrhoids     hx of bleeding  . Hypertension   . History of shingles   . Measles as a child  . Chicken pox as a child    Past Surgical History  Procedure Laterality Date  . Total knee arthroplasty      right  . Spine surgery    . Laminotomy  1193    c6-t2  . Hernia repair      umbilical hernia  . Knee surgery Right     multiple knee surgeries due to complication of R TKA  . Colonoscopy N/A 02/07/2013    Procedure: COLONOSCOPY;  Surgeon: Juanita Craver, MD;  Location: [facility] ENDOSCOPY;  Service: Endoscopy;  Laterality: N/A;  . Esophagogastroduodenoscopy N/A 02/07/2013    Procedure: ESOPHAGOGASTRODUODENOSCOPY (EGD);  Surgeon: Juanita Craver, MD;  Location: [facility] ENDOSCOPY;  Service: Endoscopy;  Laterality: N/A;  . Colonoscopy N/A 02/09/2013    Procedure: COLONOSCOPY;  Surgeon: Beryle Beams, MD;  Location: [place];  Service: Endoscopy;  Laterality: N/A;  . Givens capsule study N/A 02/09/2013    Procedure: GIVENS CAPSULE STUDY;   Surgeon: Beryle Beams, MD;  Location: [place];  Service: Endoscopy;  Laterality: N/A;    Family History  Problem Relation Age of Onset  . Arthritis Sister     "crippling"  . Arthritis Sister     History   Social History  . Marital Status: Married    Spouse Name: N/A    Number of Children: N/A  . Years of Education: N/A   Occupational History  . retired Administrator    Social History Main Topics  . Smoking status: Former Smoker    Types: Cigarettes, Pipe    Quit date: 09/20/2006  . Smokeless tobacco: Not on file  . Alcohol Use: No  . Drug Use: No  . Sexual Activity: Not on file   Other Topics Concern  . Not on file   Social History Narrative    Current Outpatient Prescriptions on File Prior to Visit  Medication Sig Dispense Refill  . allopurinol (ZYLOPRIM) 100 MG tablet TAKE 1 TABLET BY MOUTH DAILY 90 tablet 1  . atorvastatin (LIPITOR) 40 MG tablet Take 1 tablet (40 mg total) by mouth daily. 90 tablet 1  . cholecalciferol (VITAMIN D) 1000 UNITS tablet Take 2,000 Units by mouth daily.     [name]  co-enzyme Q-10 30 MG capsule Take 200 mg by mouth 3 (three) times daily.    . fluticasone (CUTIVATE) 0.05 % cream Apply topically 2 (two) times daily. Apply to lower legs twice daily as needed 60 g 0  . gabapentin (NEURONTIN) 300 MG capsule Take 1 capsule (300 mg total) by mouth 3 (three) times daily. 270 capsule 1  . lisinopril (PRINIVIL,ZESTRIL) 10 MG tablet Take 1 tablet (10 mg total) by mouth daily. 90 tablet 1  . Multiple Vitamin (MULTIVITAMIN WITH MINERALS) TABS Take 1 tablet by mouth daily.    . sennosides-docusate sodium (SENOKOT-S) 8.6-50 MG tablet Take 1 tablet by mouth 2 (two) times daily.    . traMADol (ULTRAM) 50 MG tablet Take 1 tablet (50 mg total) by mouth every 6 (six) hours as needed. 90 tablet 0   No current facility-administered medications on file prior to visit.    No Known Allergies  Review of Systems  Review of Systems  Constitutional: Negative  for fever and malaise/fatigue.  HENT: Negative for congestion.   Eyes: Negative for discharge.  Respiratory: Negative for shortness of breath.   Cardiovascular: Negative for chest pain, palpitations and leg swelling.  Gastrointestinal: Negative for nausea, abdominal pain and diarrhea.  Genitourinary: Negative for dysuria.  Musculoskeletal: Positive for myalgias, back pain, joint pain and neck pain. Negative for falls.       B/l shoulders hurt daily, limits activity to some extent  Skin: Negative for rash.  Neurological: Negative for loss of consciousness and headaches.  Endo/Heme/Allergies: Negative for polydipsia.  Psychiatric/Behavioral: Negative for depression and suicidal ideas. The patient is not nervous/anxious and does not have insomnia.     Objective  BP 131/60 mmHg  Pulse 78  Temp(Src) 98.5 F (36.9 C) (Oral)  Ht 6\' 2"  (1.88 m)  Wt   SpO2 100%  Physical Exam   Lab Results  Component Value Date   TSH 1.331 04/19/2014   Lab Results  Component Value Date   WBC 6.9 04/19/2014   HGB 12.2* 04/19/2014   HCT 37.8* 04/19/2014   MCV 87.1 04/19/2014   PLT 194 04/19/2014   Lab Results  Component Value Date   CREATININE 0.95 04/19/2014   BUN 19 04/19/2014   NA 136 04/19/2014   K 5.0 04/19/2014   CL 101 04/19/2014   CO2 29 04/19/2014   Lab Results  Component Value Date   ALT 21 04/19/2014   AST 18 04/19/2014   ALKPHOS 73 04/19/2014   BILITOT 0.5 04/19/2014   Lab Results  Component Value Date   CHOL 119 04/19/2014   Lab Results  Component Value Date   HDL 30* 04/19/2014   Lab Results  Component Value Date   LDLCALC 65 04/19/2014   Lab Results  Component Value Date   TRIG 119 04/19/2014   Lab Results  Component Value Date   CHOLHDL 4.0 04/19/2014     Assessment & Plan  Obstructive sleep apnea Using CPAP routinely continue the same  Anemia Improved significantly, will recheck CBC  Aortic valve disorder Asymptomatic, patient very inactive.  Encouraged to proceed with repeat echocardiogram but patient declines.  HYPERCHOLESTEROLEMIA Tolerating statin, encouraged heart healthy diet, avoid trans fats, minimize simple carbs and saturated fats. Increase exercise as tolerated  OBESITY, MORBID Encouraged DASH diet, decrease po intake and increase exercise as tolerated. Needs 7-8 hours of sleep nightly. Avoid trans fats, eat small, frequent meals every 4-5 hours with lean proteins, complex carbs and healthy fats. Minimize simple carbs  EDEMA  Stable encouraged minimize sodium and elevate feet above heart at least twice a day

## 2014-08-04 NOTE — Assessment & Plan Note (Signed)
Encouraged DASH diet, decrease po intake and increase exercise as tolerated. Needs 7-8 hours of sleep nightly. Avoid trans fats, eat small, frequent meals every 4-5 hours with lean proteins, complex carbs and healthy fats. Minimize simple carbs 

## 2014-08-04 NOTE — Assessment & Plan Note (Signed)
Stable encouraged minimize sodium and elevate feet above heart at least twice a day

## 2014-08-12 ENCOUNTER — Ambulatory Visit: Payer: BC Managed Care – HMO | Admitting: Cardiovascular Disease

## 2014-08-24 ENCOUNTER — Other Ambulatory Visit: Payer: Self-pay | Admitting: Family Medicine

## 2014-09-17 ENCOUNTER — Other Ambulatory Visit: Payer: Self-pay | Admitting: Family Medicine

## 2014-09-17 ENCOUNTER — Encounter: Payer: Self-pay | Admitting: Internal Medicine

## 2014-09-17 ENCOUNTER — Ambulatory Visit (INDEPENDENT_AMBULATORY_CARE_PROVIDER_SITE_OTHER): Payer: BC Managed Care – HMO | Admitting: Internal Medicine

## 2014-09-17 VITALS — BP 128/68 | HR 81

## 2014-09-17 DIAGNOSIS — I8311 Varicose veins of right lower extremity with inflammation: Secondary | ICD-10-CM

## 2014-09-17 DIAGNOSIS — I8312 Varicose veins of left lower extremity with inflammation: Secondary | ICD-10-CM

## 2014-09-17 DIAGNOSIS — G4733 Obstructive sleep apnea (adult) (pediatric): Secondary | ICD-10-CM

## 2014-09-17 DIAGNOSIS — I872 Venous insufficiency (chronic) (peripheral): Secondary | ICD-10-CM

## 2014-09-17 NOTE — Telephone Encounter (Signed)
Rx's sent to the pharmacy by e-script.//AB/CMA 

## 2014-09-17 NOTE — Progress Notes (Signed)
08/08/11- 70 yoM former smoker followed for OSA complicated by obesity, aortic stenosis, osteoarthritis LOV- 03/17/2010   wife here He has had flu vaccine At last visit CPAP 15 was too low and wife says he snored through it. We moved him back up to 18 CWP. He is comfortable with this and describes good compliance and control. He says because of his size, he never had heart valve replacement.  09/18/12 49 yoM former smoker followed for OSA complicated by obesity, aortic stenosis, osteoarthritis FOLLOWS [id number] wears CPAP/18/[facility] every night for about 8 hours; pressure working well for patient. He says he likes CPAP and has no issues with it. He remains comfortable with pressure. He has had some problems with the DME company getting timely supplies, but he will discuss this with [facility].  09/17/14- 9 yoM former smoker followed for OSA complicated by obesity, aortic stenosis, osteoarthritis FOLLOWS FOR: pt wears cpap 18/ Advanced about 8 hours nightly, denies any pressure/supply problems.  Wife here She says he snores through his mask. Machine is getting old. If anything, he has gained more weight.  ROS- see HPI Constitutional:   No-   weight loss, night sweats, fevers, chills, fatigue, lassitude. HEENT:   No-  headaches, difficulty swallowing, tooth/dental problems, sore throat,       No-  sneezing, itching, ear ache, nasal congestion, post nasal drip,  CV:  No-   chest pain, orthopnea, PND, swelling in lower extremities, anasarca,  dizziness, palpitations Resp: +shortness of breath with exertion or at rest.              No-   productive cough,  No non-productive cough,  No- coughing up of blood.              No-   change in color of mucus.  No- wheezing.   Skin: No-   rash or lesions. GI:  No-   heartburn, indigestion, abdominal pain, nausea, vomiting, GU:  MS:  No-   joint pain or swelling.   Neuro-     nothing unusual Psych:  No- change in mood or affect. No depression or  anxiety.  No memory loss.  OBJ General- Alert, Oriented, Affect-appropriate, Distress- none acute. +Morbidly obese, power  wheelchair  Skin- rash-none, lesions- none, excoriation- none Lymphadenopathy- none Head- atraumatic            Eyes- Gross vision intact, PERRLA, conjunctivae clear secretions            Ears- Hearing, canals-normal            Nose- Clear, no-Septal dev, mucus, polyps, erosion, perforation             Throat- Mallampati III-IV , mucosa clear , drainage- none, tonsils- atrophic Neck- flexible , trachea midline, no stridor , thyroid nl, carotid no bruit Chest - symmetrical excursion , unlabored           Heart/CV- RRR ,+ [id number] systolic high pitched precordial/ left parasternal murmur, no gallop,  no rub, nl s1 s2                           - JVD- none , edema- none, stasis changes- none, varices- none           Lung- clear to P&A, wheeze- none, cough- none , dullness-none, rub- none           Chest wall-  Abd-  Br/ Gen/ Rectal- Not done, not indicated Extrem-  cyanosis- none, clubbing, none, atrophy- none, strength- nl Neuro- grossly intact to observation

## 2014-09-17 NOTE — Patient Instructions (Signed)
Order- [facility]- autotitrate 10-20 cwp x 7 days for pressure recommendation  You can ask [facility] when your CPAP can be replaced

## 2014-09-21 NOTE — Assessment & Plan Note (Signed)
This problem will persist because of his weight Feet are dusky today

## 2014-09-21 NOTE — Assessment & Plan Note (Signed)
He is snoring through his CPAP. He questions whether his machine is beginning to fail-buttons don't work right. Plan-auto titrate for pressure recommendation 10-20. He will talk with his DME company about machine replacement.

## 2014-09-21 NOTE — Assessment & Plan Note (Signed)
Massively overweight. Consider bariatric referral.

## 2014-09-25 ENCOUNTER — Other Ambulatory Visit: Payer: Self-pay | Admitting: Family Medicine

## 2014-09-25 NOTE — Telephone Encounter (Signed)
Last filled:  04/19/14 Amt: 90, 0 refill Last OV:  07/29/14   Please advise.

## 2014-09-26 NOTE — Telephone Encounter (Signed)
Rx approved and printed and signed and faxed to the pharmacy.//AB/CMA

## 2014-12-02 ENCOUNTER — Ambulatory Visit (INDEPENDENT_AMBULATORY_CARE_PROVIDER_SITE_OTHER): Payer: Medicare Other | Admitting: Medical

## 2014-12-02 ENCOUNTER — Encounter: Payer: Self-pay | Admitting: Medical

## 2014-12-02 ENCOUNTER — Ambulatory Visit (HOSPITAL_BASED_OUTPATIENT_CLINIC_OR_DEPARTMENT_OTHER)
Admission: RE | Admit: 2014-12-02 | Discharge: 2014-12-02 | Disposition: A | Discharge status: Home / Self Care | Payer: Medicare Other | Source: Ambulatory Visit | Attending: Medical | Admitting: Medical

## 2014-12-02 VITALS — HR 66 | Temp 98.2°F | Ht 74.0 in

## 2014-12-02 DIAGNOSIS — J209 Acute bronchitis, unspecified: Secondary | ICD-10-CM | POA: Diagnosis not present

## 2014-12-02 DIAGNOSIS — J449 Chronic obstructive pulmonary disease, unspecified: Secondary | ICD-10-CM | POA: Insufficient documentation

## 2014-12-02 DIAGNOSIS — R062 Wheezing: Secondary | ICD-10-CM | POA: Diagnosis not present

## 2014-12-02 DIAGNOSIS — R05 Cough: Secondary | ICD-10-CM | POA: Diagnosis present

## 2014-12-02 DIAGNOSIS — Z87891 Personal history of nicotine dependence: Secondary | ICD-10-CM | POA: Diagnosis not present

## 2014-12-02 DIAGNOSIS — J301 Allergic rhinitis due to pollen: Secondary | ICD-10-CM | POA: Diagnosis not present

## 2014-12-02 DIAGNOSIS — J90 Pleural effusion, not elsewhere classified: Secondary | ICD-10-CM | POA: Diagnosis not present

## 2014-12-02 DIAGNOSIS — J309 Allergic rhinitis, unspecified: Secondary | ICD-10-CM | POA: Insufficient documentation

## 2014-12-02 MED ORDER — BECLOMETHASONE DIPROPIONATE 40 MCG/ACT IN AERS: 2.0000 | INHALATION_SPRAY | Freq: Two times a day (BID) | RESPIRATORY_TRACT | Status: DC

## 2014-12-02 MED ORDER — AZITHROMYCIN 250 MG PO TABS: ORAL_TABLET | ORAL | Status: DC

## 2014-12-02 MED ORDER — ALBUTEROL SULFATE HFA 108 (90 BASE) MCG/ACT IN AERS: 2.0000 | INHALATION_SPRAY | Freq: Four times a day (QID) | RESPIRATORY_TRACT | Status: DC | PRN

## 2014-12-02 MED ORDER — LORATADINE 10 MG PO TABS: 10.0000 mg | ORAL_TABLET | Freq: Every day | ORAL | Status: DC

## 2014-12-02 MED ORDER — FLUTICASONE PROPIONATE 50 MCG/ACT NA SUSP: 2.0000 | Freq: Every day | NASAL | Status: DC

## 2014-12-02 NOTE — Assessment & Plan Note (Signed)
Rx of claritin and flonase.

## 2014-12-02 NOTE — Assessment & Plan Note (Addendum)
Occasional wheezing with sob. Recent allergies, hx of prednisone use with similar event years ago. Also with your ac out and ciruclating outside air exposure to pollen likely increased.  Possible copd as well based on hx of smoking.  qvar and albuterol inhalers.

## 2014-12-02 NOTE — Progress Notes (Signed)
Pre visit review using our clinic review tool, if applicable. No additional management support is needed unless otherwise documented below in the visit note. 

## 2014-12-02 NOTE — Patient Instructions (Signed)
Allergic rhinitis Rx of claritin and flonase.   Wheezing Occasional wheezing with sob. Recent allergies, hx of prednisone use with similar event years ago. Also with your ac out and ciruclating outside air exposure to pollen likely increased.  Possible copd as well based on hx of smoking.  qvar and albuterol inhalers.   Acute bronchitis Possible early bronchitis. cxr today. If signs and symptoms worsen as descrbied then start azithromycin.    If at any point cardiac signs or symptoms then ED evaluation or if any severe worsening signs or symptoms  Follow up in 7-10 days or as needed.

## 2014-12-02 NOTE — Progress Notes (Signed)
Subjective:    Patient ID: [name], male    DOB: 14-Dec-1940, 74 y.o.   MRN: [id number]  HPI   Pt in nasal and chest congestion x 3 days  Pt son had recent upper respiratory infections. Some wheezing. Some watery drainage to nose. Pt has no hx of asthma or copd. No fevers, no chills or sweats. No body ahes.   Pt states feeling more sob easily/winded. Hx of this one time when he needed meds(described being on tapered prednisone) while on vacation years ago. Breathing deep on cpap recently he feels a lot better.  No chest pain.  No sneezing but feels a lot of pnd.  Pt smoked until 2008.      Review of Systems  Constitutional: Negative for fever, chills and fatigue.  Respiratory: Positive for cough and wheezing. Negative for choking.   Cardiovascular: Negative for chest pain and palpitations.  Gastrointestinal: Negative for abdominal pain, diarrhea, constipation, blood in stool, abdominal distention and anal bleeding.  Musculoskeletal: Negative for back pain.  Neurological: Negative for dizziness, seizures, syncope, speech difficulty, weakness, numbness and headaches.  Hematological: Negative for adenopathy. Does not bruise/bleed easily.   Past Medical History  Diagnosis Date  . Valvular heart disease   . Aortic stenosis   . Osteoarthritis   . Anemia   . OSA (obstructive sleep apnea)   . Bursitis   . Gout   . Arthritis   . Hyperlipidemia   . Morbid obesity   . DDD (degenerative disc disease)   . Mixed sensory-motor polyneuropathy   . Peroneal palsy     significant right foot drop  . Hemorrhoids     hx of bleeding  . Hypertension   . History of shingles   . Measles as a child  . Chicken pox as a child    History   Social History  . Marital Status: Married    Spouse Name: N/A  . Number of Children: N/A  . Years of Education: N/A   Occupational History  . retired Administrator    Social History Main Topics  . Smoking status: Former Smoker -- 30 years      Types: Cigarettes, Pipe    Quit date: 09/20/2006  . Smokeless tobacco: Never Used  . Alcohol Use: No  . Drug Use: No  . Sexual Activity: Not on file   Other Topics Concern  . Not on file   Social History Narrative    Past Surgical History  Procedure Laterality Date  . Total knee arthroplasty      right  . Spine surgery    . Laminotomy  1193    c6-t2  . Hernia repair      umbilical hernia  . Knee surgery Right     multiple knee surgeries due to complication of R TKA  . Colonoscopy N/A 02/07/2013    Procedure: COLONOSCOPY;  Surgeon: Juanita Craver, MD;  Location: [facility] ENDOSCOPY;  Service: Endoscopy;  Laterality: N/A;  . Esophagogastroduodenoscopy N/A 02/07/2013    Procedure: ESOPHAGOGASTRODUODENOSCOPY (EGD);  Surgeon: Juanita Craver, MD;  Location: [facility] ENDOSCOPY;  Service: Endoscopy;  Laterality: N/A;  . Colonoscopy N/A 02/09/2013    Procedure: COLONOSCOPY;  Surgeon: Beryle Beams, MD;  Location: [place];  Service: Endoscopy;  Laterality: N/A;  . Givens capsule study N/A 02/09/2013    Procedure: GIVENS CAPSULE STUDY;  Surgeon: Beryle Beams, MD;  Location: [place];  Service: Endoscopy;  Laterality: N/A;    Family History  Problem  Relation Age of Onset  . Arthritis Sister     "crippling"  . Arthritis Sister     No Known Allergies  Current Outpatient Prescriptions on File Prior to Visit  Medication Sig Dispense Refill  . allopurinol (ZYLOPRIM) 100 MG tablet TAKE 1 TABLET BY MOUTH DAILY 90 tablet 1  . atorvastatin (LIPITOR) 40 MG tablet TAKE 1 BY MOUTH DAILY 90 tablet 1  . cholecalciferol (VITAMIN D) 1000 UNITS tablet Take 2,000 Units by mouth daily.     . fluticasone (CUTIVATE) 0.05 % cream Apply topically 2 (two) times daily. Apply to lower legs twice daily as needed 60 g 0  . gabapentin (NEURONTIN) 300 MG capsule TAKE 1 BY MOUTH 3 TIMES DAILY 243 capsule 0  . lisinopril (PRINIVIL,ZESTRIL) 10 MG tablet TAKE 1 BY MOUTH DAILY 90 tablet 1  . Multiple Vitamin  (MULTIVITAMIN WITH MINERALS) TABS Take 1 tablet by mouth daily.    . sennosides-docusate sodium (SENOKOT-S) 8.6-50 MG tablet Take 1 tablet by mouth 2 (two) times daily.    . traMADol (ULTRAM) 50 MG tablet TAKE 1 TABLET BY MOUTH EVERY 6 HOURS AS NEEDED 90 tablet 0   No current facility-administered medications on file prior to visit.    Pulse 66  Temp(Src) 98.2 F (36.8 C) (Oral)  Ht 6\' 2"  (1.88 m)  Wt   SpO2 96%      Objective:   Physical Exam   General  Mental Status - Alert. General Appearance - Well groomed. Not in acute distress.  Skin Rashes- No Rashes.  HEENT Head- Normal. Ear Auditory Canal - Left- Normal. Right - Normal.Tympanic Membrane- Left- Normal. Right- Normal. Eye Sclera/Conjunctiva- Left- Normal. Right- Normal. Nose & Sinuses Nasal Mucosa- Left-  Boggy + Congested. Right-   boggy + Congested. Mouth & Throat Lips: Upper Lip- Normal: no dryness, cracking, pallor, cyanosis, or vesicular eruption. Lower Lip-Normal: no dryness, cracking, pallor, cyanosis or vesicular eruption. Buccal Mucosa- Bilateral- No Aphthous ulcers. Oropharynx- No Discharge or Erythema. Tonsils: Characteristics- Bilateral- No Erythema or Congestion. Size/Enlargement- Bilateral- No enlargement. Discharge- bilateral-None.  Neck Neck- Supple. No Masses.   Chest and Lung Exam Auscultation: Breath Sounds:- even and unlabored, faint bilateral upper lobe rhonchi.  Cardiovascular Auscultation:Rythm- Regular, rate and rhythm. Murmurs & Other Heart Sounds:Ausculatation of the heart reveal- No Murmurs.  Lymphatic Head & Neck General Head & Neck Lymphatics: Bilateral: Description- No Localized lymphadenopathy.  Lower ext- 1+ pedal edema bilaterally. Neg homans signs.      Assessment & Plan:

## 2014-12-02 NOTE — Assessment & Plan Note (Signed)
Possible early bronchitis. cxr today. If signs and symptoms worsen as descrbied then start azithromycin.

## 2014-12-03 ENCOUNTER — Telehealth: Payer: Self-pay | Admitting: Medical

## 2014-12-03 MED ORDER — FUROSEMIDE 20 MG PO TABS: 20.0000 mg | ORAL_TABLET | Freq: Every day | ORAL | Status: DC

## 2014-12-03 NOTE — Telephone Encounter (Signed)
Rx lasix to the pharmacy

## 2014-12-05 ENCOUNTER — Telehealth: Payer: Self-pay | Admitting: Medical

## 2014-12-05 NOTE — Telephone Encounter (Signed)
He has some fluid in his lungs. Some pedal edema. Maybe early chf. I want him to take lasix but also follow up mid week wed  with pcp or with me. May do further studies and get repeat cxr to see if fluid in lungs less.

## 2014-12-05 NOTE — Telephone Encounter (Signed)
Patient states that he was not informed that he needs to take this medication and wants to know what this is for? Best [phone]

## 2014-12-06 ENCOUNTER — Encounter: Payer: Self-pay | Admitting: Medical

## 2014-12-06 ENCOUNTER — Ambulatory Visit (INDEPENDENT_AMBULATORY_CARE_PROVIDER_SITE_OTHER): Payer: Medicare Other | Admitting: Medical

## 2014-12-06 VITALS — BP 92/37 | HR 71 | Temp 98.0°F | Ht 74.0 in

## 2014-12-06 DIAGNOSIS — J90 Pleural effusion, not elsewhere classified: Secondary | ICD-10-CM

## 2014-12-06 DIAGNOSIS — J301 Allergic rhinitis due to pollen: Secondary | ICD-10-CM

## 2014-12-06 DIAGNOSIS — I517 Cardiomegaly: Secondary | ICD-10-CM

## 2014-12-06 DIAGNOSIS — R062 Wheezing: Secondary | ICD-10-CM

## 2014-12-06 DIAGNOSIS — I350 Nonrheumatic aortic (valve) stenosis: Secondary | ICD-10-CM

## 2014-12-06 DIAGNOSIS — J209 Acute bronchitis, unspecified: Secondary | ICD-10-CM

## 2014-12-06 LAB — BRAIN NATRIURETIC PEPTIDE: Pro B Natriuretic peptide (BNP): 289 pg/mL — ABNORMAL HIGH (ref 0.0–100.0)

## 2014-12-06 LAB — COMPREHENSIVE METABOLIC PANEL
ALT: 18 U/L (ref 0–53)
AST: 19 U/L (ref 0–37)
Albumin: 3.6 g/dL (ref 3.5–5.2)
Alkaline Phosphatase: 77 U/L (ref 39–117)
BUN: 24 mg/dL — ABNORMAL HIGH (ref 6–23)
CO2: 31 mEq/L (ref 19–32)
Calcium: 9.3 mg/dL (ref 8.4–10.5)
Chloride: 104 mEq/L (ref 96–112)
Creatinine, Ser: 1.15 mg/dL (ref 0.40–1.50)
GFR: 66.37 mL/min (ref >60.00)
Glucose, Bld: 87 mg/dL (ref 70–99)
Potassium: 5 mEq/L (ref 3.5–5.1)
Sodium: 139 mEq/L (ref 135–145)
Total Bilirubin: 0.5 mg/dL (ref 0.2–1.2)
Total Protein: 6.6 g/dL (ref 6.0–8.3)

## 2014-12-06 NOTE — Patient Instructions (Signed)
Allergic rhinitis Continue claritin and flonase.   Acute bronchitis Much improved clinically.   Wheezing He responded to the inhalers.   Pleural effusion Possible chf per radiology. But respiratory issues cleared with zpack and inhalers. Do not take lasix presently. Will get bnp today and assess level. Also refer you to cardiology for repeat echo and eval of severe aortic stenosis.    Follow up in 2 weeks or as needed

## 2014-12-06 NOTE — Assessment & Plan Note (Signed)
Possible chf per radiology. But respiratory issues cleared with zpack and inhalers. Do not take lasix presently. Will get bnp today and assess level. Also refer you to cardiology for repeat echo and eval of severe aortic stenosis.

## 2014-12-06 NOTE — Assessment & Plan Note (Signed)
He responded to the inhalers.

## 2014-12-06 NOTE — Progress Notes (Signed)
Subjective:    Patient ID: [name], male    DOB: 02-07-1941, 74 y.o.   MRN: [id number]  HPI   Pt in for follow up. Pt is nasal congested. Pt has exposure to pollen recently.  Last appointment wheezing and chest congestion. Pt did respond qvar and albuterol Also zithromax helped. His respiratory symptoms have cleared in lungs. But nasal congested now.  Pt cxr showed possible effusion. But xray was not ideal position.  I called in lasix. But pt did not taken those as of yet.  We can't weigh pt today.   I reviewed his transesophageal echo today. EF 65% in 2011. LV hypertrophy. Severe aortic stenosis.    Review of Systems  Constitutional: Negative for fever, chills, diaphoresis, activity change and fatigue.  HENT: Positive for congestion. Negative for ear pain, hearing loss, nosebleeds, postnasal drip, rhinorrhea, sinus pressure and sore throat.   Respiratory: Negative for cough, chest tightness, shortness of breath and wheezing.   Cardiovascular: Negative for chest pain, palpitations and leg swelling.  Gastrointestinal: Negative for nausea, vomiting and abdominal pain.  Musculoskeletal: Negative for neck pain and neck stiffness.  Neurological: Negative for dizziness, tremors, seizures, syncope, facial asymmetry, speech difficulty, weakness, light-headedness, numbness and headaches.  Psychiatric/Behavioral: Negative for behavioral problems, confusion and agitation. The patient is not nervous/anxious.    Past Medical History  Diagnosis Date  . Valvular heart disease   . Aortic stenosis   . Osteoarthritis   . Anemia   . OSA (obstructive sleep apnea)   . Bursitis   . Gout   . Arthritis   . Hyperlipidemia   . Morbid obesity   . DDD (degenerative disc disease)   . Mixed sensory-motor polyneuropathy   . Peroneal palsy     significant right foot drop  . Hemorrhoids     hx of bleeding  . Hypertension   . History of shingles   . Measles as a child  . Chicken pox as a child     History   Social History  . Marital Status: Married    Spouse Name: N/A  . Number of Children: N/A  . Years of Education: N/A   Occupational History  . retired Administrator    Social History Main Topics  . Smoking status: Former Smoker -- 30 years    Types: Cigarettes, Pipe    Quit date: 09/20/2006  . Smokeless tobacco: Never Used  . Alcohol Use: No  . Drug Use: No  . Sexual Activity: Not on file   Other Topics Concern  . Not on file   Social History Narrative    Past Surgical History  Procedure Laterality Date  . Total knee arthroplasty      right  . Spine surgery    . Laminotomy  1193    c6-t2  . Hernia repair      umbilical hernia  . Knee surgery Right     multiple knee surgeries due to complication of R TKA  . Colonoscopy N/A 02/07/2013    Procedure: COLONOSCOPY;  Surgeon: Juanita Craver, MD;  Location: [facility] ENDOSCOPY;  Service: Endoscopy;  Laterality: N/A;  . Esophagogastroduodenoscopy N/A 02/07/2013    Procedure: ESOPHAGOGASTRODUODENOSCOPY (EGD);  Surgeon: Juanita Craver, MD;  Location: [facility] ENDOSCOPY;  Service: Endoscopy;  Laterality: N/A;  . Colonoscopy N/A 02/09/2013    Procedure: COLONOSCOPY;  Surgeon: Beryle Beams, MD;  Location: [place];  Service: Endoscopy;  Laterality: N/A;  . Givens capsule study N/A 02/09/2013  Procedure: GIVENS CAPSULE STUDY;  Surgeon: Beryle Beams, MD;  Location: [facility] ENDOSCOPY;  Service: Endoscopy;  Laterality: N/A;    Family History  Problem Relation Age of Onset  . Arthritis Sister     "crippling"  . Arthritis Sister     No Known Allergies  Current Outpatient Prescriptions on File Prior to Visit  Medication Sig Dispense Refill  . albuterol (PROVENTIL HFA;VENTOLIN HFA) 108 (90 BASE) MCG/ACT inhaler Inhale 2 puffs into the lungs every 6 (six) hours as needed for wheezing or shortness of breath. 1 Inhaler 0  . allopurinol (ZYLOPRIM) 100 MG tablet TAKE 1 TABLET BY MOUTH DAILY 90 tablet 1  . atorvastatin (LIPITOR) 40 MG  tablet TAKE 1 BY MOUTH DAILY 90 tablet 1  . azithromycin (ZITHROMAX) 250 MG tablet Take 2 tablets by mouth on day 1, followed by 1 tablet by mouth daily for 4 days. 6 tablet 0  . beclomethasone (QVAR) 40 MCG/ACT inhaler Inhale 2 puffs into the lungs 2 (two) times daily. 1 Inhaler 1  . cholecalciferol (VITAMIN D) 1000 UNITS tablet Take 2,000 Units by mouth daily.     . fluticasone (CUTIVATE) 0.05 % cream Apply topically 2 (two) times daily. Apply to lower legs twice daily as needed 60 g 0  . fluticasone (FLONASE) 50 MCG/ACT nasal spray Place 2 sprays into both nostrils daily. 16 g 0  . furosemide (LASIX) 20 MG tablet Take 1 tablet (20 mg total) by mouth daily. 14 tablet 0  . gabapentin (NEURONTIN) 300 MG capsule TAKE 1 BY MOUTH 3 TIMES DAILY 243 capsule 0  . lisinopril (PRINIVIL,ZESTRIL) 10 MG tablet TAKE 1 BY MOUTH DAILY 90 tablet 1  . loratadine (CLARITIN) 10 MG tablet Take 1 tablet (10 mg total) by mouth daily. 30 tablet 0  . Multiple Vitamin (MULTIVITAMIN WITH MINERALS) TABS Take 1 tablet by mouth daily.    . sennosides-docusate sodium (SENOKOT-S) 8.6-50 MG tablet Take 1 tablet by mouth 2 (two) times daily.    . traMADol (ULTRAM) 50 MG tablet TAKE 1 TABLET BY MOUTH EVERY 6 HOURS AS NEEDED 90 tablet 0   No current facility-administered medications on file prior to visit.    BP 92/37 mmHg  Pulse 71  Temp(Src) 98 F (36.7 C) (Oral)  Ht 6\' 2"  (1.88 m)  Wt   SpO2 97%      Objective:   Physical Exam  General  Mental Status - Alert. General Appearance - Well groomed. Not in acute distress.  Skin Rashes- No Rashes.  HEENT Head- Normal. Ear Auditory Canal - Left- Normal. Right - Normal.Tympanic Membrane- Left- Normal. Right- Normal. Eye Sclera/Conjunctiva- Left- Normal. Right- Normal. Nose & Sinuses Nasal Mucosa- Left-  Mild  boggy + Congested. Right-  Mild  boggy + Congested. Mouth & Throat Lips: Upper Lip- Normal: no dryness, cracking, pallor, cyanosis, or vesicular eruption.  Lower Lip-Normal: no dryness, cracking, pallor, cyanosis or vesicular eruption. Buccal Mucosa- Bilateral- No Aphthous ulcers. Oropharynx- No Discharge or Erythema. Tonsils: Characteristics- Bilateral- No Erythema or Congestion. Size/Enlargement- Bilateral- No enlargement. Discharge- bilateral-None.  Neck Neck- Supple. No Masses. No jvd.  Chest and Lung Exam Auscultation: Breath Sounds:- even and unlabored,  Cardiovascular Auscultation:Rythm- Regular, rate and rhythm. Murmurs & Other Heart Sounds:Ausculatation of the heart reveal- No Murmurs.  Lymphatic Head & Neck General Head & Neck Lymphatics: Bilateral: Description- No Localized lymphadenopathy.  Lower ext- faint 1+ pedal edema at best. Negative homans signs bilaterally.       Assessment & Plan:

## 2014-12-06 NOTE — Assessment & Plan Note (Signed)
Continue claritin and flonase.

## 2014-12-06 NOTE — Assessment & Plan Note (Signed)
Much improved clinically.

## 2014-12-06 NOTE — Progress Notes (Signed)
Pre visit review using our clinic review tool, if applicable. No additional management support is needed unless otherwise documented below in the visit note. 

## 2014-12-16 ENCOUNTER — Other Ambulatory Visit: Payer: Self-pay | Admitting: Family Medicine

## 2014-12-20 ENCOUNTER — Ambulatory Visit (INDEPENDENT_AMBULATORY_CARE_PROVIDER_SITE_OTHER): Payer: Medicare Other | Admitting: Medical

## 2014-12-20 ENCOUNTER — Encounter: Payer: Self-pay | Admitting: Medical

## 2014-12-20 VITALS — BP 126/60 | HR 69 | Temp 98.1°F | Ht 74.0 in

## 2014-12-20 DIAGNOSIS — R062 Wheezing: Secondary | ICD-10-CM | POA: Diagnosis not present

## 2014-12-20 DIAGNOSIS — J209 Acute bronchitis, unspecified: Secondary | ICD-10-CM

## 2014-12-20 DIAGNOSIS — J3089 Other allergic rhinitis: Secondary | ICD-10-CM

## 2014-12-20 DIAGNOSIS — J90 Pleural effusion, not elsewhere classified: Secondary | ICD-10-CM | POA: Diagnosis not present

## 2014-12-20 DIAGNOSIS — I359 Nonrheumatic aortic valve disorder, unspecified: Secondary | ICD-10-CM

## 2014-12-20 NOTE — Patient Instructions (Signed)
Allergic rhinitis Resolved.   Acute bronchitis Resolved.   Wheezing Resolved.   Pleural effusion Pt had this on prior xray. Also mild bnp elevation. His respiratry symptoms all cleared with allergy treatment, inhaler and zpack. He never had to [name] lasix. Clinically stable 02 % 96. No obvius pedal edema today. So presently will wait and get cardiologist opnion mid April. If he gets any respiratory symptoms during interim be seen. Would repeat prior cxr and bnp.  Note his weight today was estimate. No actual weight. Clinically I don't think this matches. He did not bring walker so could not stand to be weighed.   Aortic valve disorder With lvh. Expect cardiologist to get echo when they see him.     Follow up with Dr. Charlett Blake 2 wks after cardiologist. As needed with me prior to cardiolgist if any issues.

## 2014-12-20 NOTE — Assessment & Plan Note (Signed)
Pt had this on prior xray. Also mild bnp elevation. His respiratry symptoms all cleared with allergy treatment, inhaler and zpack. He never had to [name] lasix. Clinically stable 02 % 96. No obvius pedal edema today. So presently will wait and get cardiologist opnion mid April. If he gets any respiratory symptoms during interim be seen. Would repeat prior cxr and bnp.  Note his weight today was estimate. No actual weight. Clinically I don't think this matches. He did not bring walker so could not stand to be weighed.

## 2014-12-20 NOTE — Progress Notes (Signed)
Pre visit review using our clinic review tool, if applicable. No additional management support is needed unless otherwise documented below in the visit note. 

## 2014-12-20 NOTE — Assessment & Plan Note (Signed)
With lvh. Expect cardiologist to get echo when they see him.

## 2014-12-20 NOTE — Progress Notes (Signed)
Subjective:    Patient ID: [name], male    DOB: March 11, 1941, 74 y.o.   MRN: [id number]  HPI   Pt in stating he feels very well. Pt bmp was mild elevated in the past. His cxr showed maybe mild bilateral small effusion. Pt O2 sat is 97% today.   Pt states his wheezing, sob and tightness has resolved. He feels great per his report.  No use of inhalers for about a week.   Took claritin and flonase. Also zpack as rx'd.  Pt has Appointment with Cardiologist on Apirl 18th.     Review of Systems  Constitutional: Negative for fever, chills, diaphoresis, activity change and fatigue.  HENT: Negative for ear pain.   Respiratory: Negative for cough, chest tightness and shortness of breath.        Much better now.  Cardiovascular: Negative for chest pain, palpitations and leg swelling.  Gastrointestinal: Negative for nausea, vomiting and abdominal pain.  Musculoskeletal: Negative for back pain, neck pain and neck stiffness.  Neurological: Negative for dizziness, tremors, seizures, syncope, facial asymmetry, speech difficulty, weakness, light-headedness, numbness and headaches.  Psychiatric/Behavioral: Negative for behavioral problems, confusion and agitation. The patient is not nervous/anxious.    Past Medical History  Diagnosis Date  . Valvular heart disease   . Aortic stenosis   . Osteoarthritis   . Anemia   . OSA (obstructive sleep apnea)   . Bursitis   . Gout   . Arthritis   . Hyperlipidemia   . Morbid obesity   . DDD (degenerative disc disease)   . Mixed sensory-motor polyneuropathy   . Peroneal palsy     significant right foot drop  . Hemorrhoids     hx of bleeding  . Hypertension   . History of shingles   . Measles as a child  . Chicken pox as a child    History   Social History  . Marital Status: Married    Spouse Name: N/A  . Number of Children: N/A  . Years of Education: N/A   Occupational History  . retired Administrator    Social History Main  Topics  . Smoking status: Former Smoker -- 30 years    Types: Cigarettes, Pipe    Quit date: 09/20/2006  . Smokeless tobacco: Never Used  . Alcohol Use: No  . Drug Use: No  . Sexual Activity: Not on file   Other Topics Concern  . Not on file   Social History Narrative    Past Surgical History  Procedure Laterality Date  . Total knee arthroplasty      right  . Spine surgery    . Laminotomy  1193    c6-t2  . Hernia repair      umbilical hernia  . Knee surgery Right     multiple knee surgeries due to complication of R TKA  . Colonoscopy N/A 02/07/2013    Procedure: COLONOSCOPY;  Surgeon: Juanita Craver, MD;  Location: [facility] ENDOSCOPY;  Service: Endoscopy;  Laterality: N/A;  . Esophagogastroduodenoscopy N/A 02/07/2013    Procedure: ESOPHAGOGASTRODUODENOSCOPY (EGD);  Surgeon: Juanita Craver, MD;  Location: [facility] ENDOSCOPY;  Service: Endoscopy;  Laterality: N/A;  . Colonoscopy N/A 02/09/2013    Procedure: COLONOSCOPY;  Surgeon: Beryle Beams, MD;  Location: [place];  Service: Endoscopy;  Laterality: N/A;  . Givens capsule study N/A 02/09/2013    Procedure: GIVENS CAPSULE STUDY;  Surgeon: Beryle Beams, MD;  Location: [place];  Service: Endoscopy;  Laterality: N/A;  Family History  Problem Relation Age of Onset  . Arthritis Sister     "crippling"  . Arthritis Sister     No Known Allergies  Current Outpatient Prescriptions on File Prior to Visit  Medication Sig Dispense Refill  . albuterol (PROVENTIL HFA;VENTOLIN HFA) 108 (90 BASE) MCG/ACT inhaler Inhale 2 puffs into the lungs every 6 (six) hours as needed for wheezing or shortness of breath. 1 Inhaler 0  . allopurinol (ZYLOPRIM) 100 MG tablet TAKE 1 BY MOUTH DAILY 90 tablet 0  . atorvastatin (LIPITOR) 40 MG tablet TAKE 1 BY MOUTH DAILY 90 tablet 1  . beclomethasone (QVAR) 40 MCG/ACT inhaler Inhale 2 puffs into the lungs 2 (two) times daily. 1 Inhaler 1  . cholecalciferol (VITAMIN D) 1000 UNITS tablet Take 2,000 Units by  mouth daily.     . fluticasone (CUTIVATE) 0.05 % cream Apply topically 2 (two) times daily. Apply to lower legs twice daily as needed 60 g 0  . fluticasone (FLONASE) 50 MCG/ACT nasal spray Place 2 sprays into both nostrils daily. 16 g 0  . furosemide (LASIX) 20 MG tablet Take 1 tablet (20 mg total) by mouth daily. 14 tablet 0  . gabapentin (NEURONTIN) 300 MG capsule TAKE 1 BY MOUTH 3 TIMES DAILY 243 capsule 0  . lisinopril (PRINIVIL,ZESTRIL) 10 MG tablet TAKE 1 BY MOUTH DAILY 90 tablet 1  . loratadine (CLARITIN) 10 MG tablet Take 1 tablet (10 mg total) by mouth daily. 30 tablet 0  . Multiple Vitamin (MULTIVITAMIN WITH MINERALS) TABS Take 1 tablet by mouth daily.    . sennosides-docusate sodium (SENOKOT-S) 8.6-50 MG tablet Take 1 tablet by mouth 2 (two) times daily.    . traMADol (ULTRAM) 50 MG tablet TAKE 1 TABLET BY MOUTH EVERY 6 HOURS AS NEEDED 90 tablet 0   No current facility-administered medications on file prior to visit.    BP 126/60 mmHg  Pulse 69  Temp(Src) 98.1 F (36.7 C) (Oral)  Ht 6\' 2"  (1.88 m)  Wt   SpO2 96%      Objective:   Physical Exam  General Mental Status- Alert. General Appearance- Not in acute distress.   Skin General: Color- Normal Color. Moisture- Normal Moisture.  Neck Carotid Arteries- Normal color. Moisture- Normal Moisture. No carotid bruits. No JVD.  Chest and Lung Exam Auscultation: Breath Sounds:-Normal.  Cardiovascular Auscultation:Rythm- Regular. Murmurs & Other Heart Sounds:Auscultation of the heart reveals- No Murmurs.  Abdomen Inspection:-Inspeection Normal. Palpation/Percussion:Note:No mass. Palpation and Percussion of the abdomen reveal- Non Tender, Non Distended + BS, no rebound or guarding.    Neurologic Cranial Nerve exam:- CN III-XII intact(No nystagmus), symmetric smile. Strength:- 5/5 equal and symmetric strength both upper and lower extremities.  Lower ext- no obvious pedal edema. Negative homans signs.        Assessment & Plan:

## 2014-12-20 NOTE — Assessment & Plan Note (Signed)
Resolved

## 2014-12-23 ENCOUNTER — Other Ambulatory Visit: Payer: Self-pay | Admitting: Family Medicine

## 2014-12-24 NOTE — Telephone Encounter (Signed)
Rx printed, awaiting signature by Dr. Charlett Blake.

## 2014-12-24 NOTE — Telephone Encounter (Signed)
Ok to refill 

## 2014-12-24 NOTE — Telephone Encounter (Signed)
Faxed to [facility] pharmacy.

## 2014-12-24 NOTE — Telephone Encounter (Signed)
Pt is requesting refill on Tramadol.  Last OV: 07/29/2014 Last Fill: 09/25/2014 # [id number] UDS: None  Please advise.

## 2015-01-03 NOTE — Progress Notes (Signed)
Patient ID: [name], male   DOB: 08-Jun-1941, 74 y.o.   MRN: [id number]   73 y.o. with chronic venous insufficiency  on lasix 40mg  alt with 20 mg, HTN and elevated lipids  Last seen in 2011 for AS  He is morbidly obese and mostly in a wheel chair.  5/14 was hospitalized for 16 unit bleed  Source not clear but had upper/lower endoscopy and capsule.  During hospitalization has ? 6 second pause vs leads coming off telemetry  Patient Indicates he was fine. His Aortic Valve cannot be seen well by TTE.  I last did TEE on him in 2011  Severe LVH peak gradient 97 mmHg  Mean gradient 67mmHg EF normal moderate AR Trivial MR Had 50ug fentanyl and 6 mg versed.    Recently Rx by primary for allergies and URI with zpack and inhalers.  CXR with small bilateral effusion no , chest pain or syncope  Has RLE foot drop from previous surgery.  Had long rehab after GI bleed just to be able to use walker again.  Discussed need for f/u TEE and need for anesthesia to be present to monitor airway.  He prefers to wait and not take any risks or have more procedures as he is still trying to recover from everything done last hospitalization.  We also discussed the newer Rx modality of TAVR which I think we should move toward .  The valve may be hard to size by CT as well as getting his peripheral vessels sized   He is willing to have TEE in June  Brother in law is dying of lung issues and son having some cancer issues.  We need accurate weight on him he thinks its 400 lbs  May be some issues with pre TAVR CT scanning      ROS: Denies fever, malais, weight loss, blurry vision, decreased visual acuity, cough, sputum, SOB, hemoptysis, pleuritic pain, palpitaitons, heartburn, abdominal pain, melena, lower extremity edema, claudication, or rash.  All other systems reviewed and negative   General: Affect appropriate Obese white male in wheel chair  HEENT: normal Neck supple with no adenopathy JVP normal no bruits no  thyromegaly Lungs clear with no wheezing and good diaphragmatic motion Heart:  S1/S2 AS/AR murmur,rub, gallop or click PMI normal Abdomen: benighn, BS positve, no tenderness, no AAA no bruit.  No HSM or HJR Distal pulses intact with no bruits Plus 2-3 chronic edema with venous stasis  Neuro non-focal Skin warm and dry No muscular weakness  Medications Current Outpatient Prescriptions  Medication Sig Dispense Refill  . albuterol (PROVENTIL HFA;VENTOLIN HFA) 108 (90 BASE) MCG/ACT inhaler Inhale 2 puffs into the lungs every 6 (six) hours as needed for wheezing or shortness of breath. 1 Inhaler 0  . allopurinol (ZYLOPRIM) 100 MG tablet TAKE 1 BY MOUTH DAILY 90 tablet 0  . atorvastatin (LIPITOR) 40 MG tablet TAKE 1 BY MOUTH DAILY 90 tablet 1  . beclomethasone (QVAR) 40 MCG/ACT inhaler Inhale 2 puffs into the lungs 2 (two) times daily. 1 Inhaler 1  . cholecalciferol (VITAMIN D) 1000 UNITS tablet Take 2,000 Units by mouth daily.     . fluticasone (CUTIVATE) 0.05 % cream Apply topically 2 (two) times daily. Apply to lower legs twice daily as needed 60 g 0  . fluticasone (FLONASE) 50 MCG/ACT nasal spray Place 2 sprays into both nostrils daily. 16 g 0  . furosemide (LASIX) 20 MG tablet Take 1 tablet (20 mg total) by mouth daily. 14 tablet  0  . gabapentin (NEURONTIN) 300 MG capsule TAKE 1 BY MOUTH 3 TIMES DAILY 243 capsule 0  . lisinopril (PRINIVIL,ZESTRIL) 10 MG tablet TAKE 1 BY MOUTH DAILY 90 tablet 1  . loratadine (CLARITIN) 10 MG tablet Take 1 tablet (10 mg total) by mouth daily. 30 tablet 0  . Multiple Vitamin (MULTIVITAMIN WITH MINERALS) TABS Take 1 tablet by mouth daily.    . sennosides-docusate sodium (SENOKOT-S) 8.6-50 MG tablet Take 1 tablet by mouth 2 (two) times daily.    . traMADol (ULTRAM) 50 MG tablet TAKE 1 TABLET BY MOUTH EVERY 6 HOURS AS NEEDED 90 tablet 0   No current facility-administered medications for this visit.    Allergies Review of patient's allergies indicates no  known allergies.  Family History: Family History  Problem Relation Age of Onset  . Arthritis Sister     "crippling"  . Arthritis Sister     Social History: History   Social History  . Marital Status: Married    Spouse Name: N/A  . Number of Children: N/A  . Years of Education: N/A   Occupational History  . retired Administrator    Social History Main Topics  . Smoking status: Former Smoker -- 30 years    Types: Cigarettes, Pipe    Quit date: 09/20/2006  . Smokeless tobacco: Never Used  . Alcohol Use: No  . Drug Use: No  . Sexual Activity: Not on file   Other Topics Concern  . Not on file   Social History Narrative    Electrocardiogram:  SR PR 222 first degree ICRBBB 02/17/13   01/14/14  SR rate 70 PR 218 RBBB  4/18  SR rate 72  Short burst atrial arrhythmia  RBBB   Assessment and Plan

## 2015-01-06 ENCOUNTER — Ambulatory Visit (INDEPENDENT_AMBULATORY_CARE_PROVIDER_SITE_OTHER): Payer: Medicare Other | Admitting: Cardiovascular Disease

## 2015-01-06 ENCOUNTER — Encounter: Payer: Self-pay | Admitting: *Deleted

## 2015-01-06 ENCOUNTER — Encounter: Payer: Self-pay | Admitting: Cardiovascular Disease

## 2015-01-06 VITALS — BP 120/72 | HR 72 | Ht 74.0 in

## 2015-01-06 DIAGNOSIS — I872 Venous insufficiency (chronic) (peripheral): Secondary | ICD-10-CM

## 2015-01-06 DIAGNOSIS — K922 Gastrointestinal hemorrhage, unspecified: Secondary | ICD-10-CM | POA: Diagnosis not present

## 2015-01-06 DIAGNOSIS — Z79899 Other long term (current) drug therapy: Secondary | ICD-10-CM

## 2015-01-06 DIAGNOSIS — I359 Nonrheumatic aortic valve disorder, unspecified: Secondary | ICD-10-CM

## 2015-01-06 MED ORDER — FUROSEMIDE 20 MG PO TABS: 20.0000 mg | ORAL_TABLET | Freq: Every day | ORAL | Status: DC

## 2015-01-06 NOTE — Patient Instructions (Addendum)
Medication Instructions:  NO  CHANGES  Labwork: 5 WEEKS  CBC  BMET   6.10.16 Testing/Procedures: Your physician has requested that you have a TEE. During a TEE, sound waves are used to create images of your heart. It provides your doctor with information about the size and shape of your heart and how well your heart's chambers and valves are working. In this test, a transducer is attached to the end of a flexible tube that's guided down your throat and into your esophagus (the tube leading from you mouth to your stomach) to get a more detailed image of your heart. You are not awake for the procedure. Please see the instruction sheet given to you today. For further information please visit [email or web address]. [email or web address]  Follow-Up: 10   WEEKS WITH  DR [name]  AFTER TEE  Any Other Special Instructions Will Be Listed Below (If Applicable).

## 2015-01-06 NOTE — Assessment & Plan Note (Signed)
Stable Hb improved f/u with primary

## 2015-01-06 NOTE — Assessment & Plan Note (Signed)
No skin breakdown continue lasix

## 2015-01-06 NOTE — Assessment & Plan Note (Signed)
Likely critical AS not well characterized in 5 years.  TEE for 6/17    Risks including intubation discussed Will have anesthesia present for propofol Once we have TEE can discuss with Dr Roxy Manns and Burt Knack if they would even want to consider TAVR and proceed with CTA

## 2015-01-07 ENCOUNTER — Encounter: Payer: Self-pay | Admitting: Cardiovascular Disease

## 2015-01-13 ENCOUNTER — Other Ambulatory Visit: Payer: BC Managed Care – HMO

## 2015-01-23 ENCOUNTER — Encounter: Payer: BC Managed Care – HMO | Admitting: Family Medicine

## 2015-02-05 ENCOUNTER — Telehealth: Payer: Self-pay | Admitting: Family Medicine

## 2015-02-05 NOTE — Telephone Encounter (Signed)
Pre Visit letter sent  °

## 2015-02-21 ENCOUNTER — Other Ambulatory Visit (INDEPENDENT_AMBULATORY_CARE_PROVIDER_SITE_OTHER): Payer: Medicare Other

## 2015-02-21 DIAGNOSIS — D649 Anemia, unspecified: Secondary | ICD-10-CM

## 2015-02-21 DIAGNOSIS — M109 Gout, unspecified: Secondary | ICD-10-CM

## 2015-02-21 DIAGNOSIS — E7800 Pure hypercholesterolemia, unspecified: Secondary | ICD-10-CM

## 2015-02-21 DIAGNOSIS — G4733 Obstructive sleep apnea (adult) (pediatric): Secondary | ICD-10-CM | POA: Diagnosis not present

## 2015-02-21 DIAGNOSIS — I359 Nonrheumatic aortic valve disorder, unspecified: Secondary | ICD-10-CM

## 2015-02-21 LAB — HEPATIC FUNCTION PANEL
ALT: 19 U/L (ref 0–53)
AST: 18 U/L (ref 0–37)
Albumin: 3.7 g/dL (ref 3.5–5.2)
Alkaline Phosphatase: 81 U/L (ref 39–117)
Bilirubin, Direct: 0.1 mg/dL (ref 0.0–0.3)
Total Bilirubin: 0.5 mg/dL (ref 0.2–1.2)
Total Protein: 6.6 g/dL (ref 6.0–8.3)

## 2015-02-21 LAB — RENAL FUNCTION PANEL
Albumin: 3.7 g/dL (ref 3.5–5.2)
BUN: 26 mg/dL — ABNORMAL HIGH (ref 6–23)
CO2: 32 mEq/L (ref 19–32)
Calcium: 9.3 mg/dL (ref 8.4–10.5)
Chloride: 103 mEq/L (ref 96–112)
Creatinine, Ser: 1.13 mg/dL (ref 0.40–1.50)
GFR: 67.69 mL/min (ref >60.00)
Glucose, Bld: 93 mg/dL (ref 70–99)
Phosphorus: 4 mg/dL (ref 2.3–4.6)
Potassium: 5.2 mEq/L — ABNORMAL HIGH (ref 3.5–5.1)
Sodium: 139 mEq/L (ref 135–145)

## 2015-02-21 LAB — CBC
HCT: 35.6 % — ABNORMAL LOW (ref 39.0–52.0)
Hemoglobin: 11.5 g/dL — ABNORMAL LOW (ref 13.0–17.0)
MCHC: 32.3 g/dL (ref 30.0–36.0)
MCV: 88 fl (ref 78.0–100.0)
Platelets: 162 10*3/uL (ref 150.0–400.0)
RBC: 4.04 Mil/uL — ABNORMAL LOW (ref 4.22–5.81)
RDW: 17.1 % — ABNORMAL HIGH (ref 11.5–15.5)
WBC: 7.4 10*3/uL (ref 4.0–10.5)

## 2015-02-21 LAB — LIPID PANEL
Cholesterol: 118 mg/dL (ref 0–200)
HDL: 29.3 mg/dL — ABNORMAL LOW (ref >39.00)
LDL Cholesterol: 72 mg/dL (ref 0–99)
NonHDL: 88.7
Total CHOL/HDL Ratio: 4
Triglycerides: 84 mg/dL (ref 0.0–149.0)
VLDL: 16.8 mg/dL (ref 0.0–40.0)

## 2015-02-21 LAB — URIC ACID: Uric Acid, Serum: 8.5 mg/dL — ABNORMAL HIGH (ref 4.0–7.8)

## 2015-02-21 LAB — TSH: TSH: 1.09 u[IU]/mL (ref 0.35–4.50)

## 2015-02-27 ENCOUNTER — Telehealth: Payer: Self-pay | Admitting: *Deleted

## 2015-02-27 ENCOUNTER — Encounter: Payer: Self-pay | Admitting: *Deleted

## 2015-02-27 NOTE — Telephone Encounter (Signed)
Pre-Visit Call completed with patient and chart updated.   Pre-Visit Info documented in Specialty Comments under [employer].    

## 2015-02-28 ENCOUNTER — Encounter: Payer: Self-pay | Admitting: Family Medicine

## 2015-02-28 ENCOUNTER — Ambulatory Visit (INDEPENDENT_AMBULATORY_CARE_PROVIDER_SITE_OTHER): Payer: Medicare Other | Admitting: Family Medicine

## 2015-02-28 VITALS — BP 132/82 | HR 75 | Temp 98.6°F | Ht 74.0 in | Wt 385.0 lb

## 2015-02-28 DIAGNOSIS — Z0000 Encounter for general adult medical examination without abnormal findings: Secondary | ICD-10-CM | POA: Diagnosis not present

## 2015-02-28 DIAGNOSIS — I38 Endocarditis, valve unspecified: Secondary | ICD-10-CM | POA: Diagnosis not present

## 2015-02-28 DIAGNOSIS — M539 Dorsopathy, unspecified: Secondary | ICD-10-CM

## 2015-02-28 DIAGNOSIS — E7800 Pure hypercholesterolemia, unspecified: Secondary | ICD-10-CM

## 2015-02-28 DIAGNOSIS — M109 Gout, unspecified: Secondary | ICD-10-CM

## 2015-02-28 DIAGNOSIS — E875 Hyperkalemia: Secondary | ICD-10-CM

## 2015-02-28 MED ORDER — TRAMADOL HCL 50 MG PO TABS: 50.0000 mg | ORAL_TABLET | Freq: Four times a day (QID) | ORAL | Status: DC | PRN

## 2015-02-28 MED ORDER — LISINOPRIL 10 MG PO TABS: ORAL_TABLET | ORAL | Status: DC

## 2015-02-28 MED ORDER — ATORVASTATIN CALCIUM 40 MG PO TABS: ORAL_TABLET | ORAL | Status: DC

## 2015-02-28 MED ORDER — GABAPENTIN 300 MG PO CAPS: ORAL_CAPSULE | ORAL | Status: DC

## 2015-02-28 NOTE — Assessment & Plan Note (Signed)
Has a TEE with Dr [name] in next week. Symptoms much better since respiratory illness has resolved

## 2015-02-28 NOTE — Progress Notes (Signed)
Pre visit review using our clinic review tool, if applicable. No additional management support is needed unless otherwise documented below in the visit note. 

## 2015-02-28 NOTE — Patient Instructions (Signed)
Preventive Care for Adults A healthy lifestyle and preventive care can promote health and wellness. Preventive health guidelines for men include the following key practices:  A routine yearly physical is a good way to check with your health care provider about your health and preventative screening. It is a chance to share any concerns and updates on your health and to receive a thorough exam.  Visit your dentist for a routine exam and preventative care every 6 months. Brush your teeth twice a day and floss once a day. Good oral hygiene prevents tooth decay and gum disease.  The frequency of eye exams is based on your age, health, family medical history, use of contact lenses, and other factors. Follow your health care provider's recommendations for frequency of eye exams.  Eat a healthy diet. Foods such as vegetables, fruits, whole grains, low-fat dairy products, and lean protein foods contain the nutrients you need without too many calories. Decrease your intake of foods high in solid fats, added sugars, and salt. Eat the right amount of calories for you.Get information about a proper diet from your health care provider, if necessary.  Regular physical exercise is one of the most important things you can do for your health. Most adults should get at least 150 minutes of moderate-intensity exercise (any activity that increases your heart rate and causes you to sweat) each week. In addition, most adults need muscle-strengthening exercises on 2 or more days a week.  Maintain a healthy weight. The body mass index (BMI) is a screening tool to identify possible weight problems. It provides an estimate of body fat based on height and weight. Your health care provider can find your BMI and can help you achieve or maintain a healthy weight.For adults 20 years and older:  A BMI below 18.5 is considered underweight.  A BMI of 18.5 to 24.9 is normal.  A BMI of 25 to 29.9 is considered overweight.  A BMI  of 30 and above is considered obese.  Maintain normal blood lipids and cholesterol levels by exercising and minimizing your intake of saturated fat. Eat a balanced diet with plenty of fruit and vegetables. Blood tests for lipids and cholesterol should begin at age 50 and be repeated every 5 years. If your lipid or cholesterol levels are high, you are over 50, or you are at high risk for heart disease, you may need your cholesterol levels checked more frequently.Ongoing high lipid and cholesterol levels should be treated with medicines if diet and exercise are not working.  If you smoke, find out from your health care provider how to quit. If you do not use tobacco, do not start.  Lung cancer screening is recommended for adults aged 73-80 years who are at high risk for developing lung cancer because of a history of smoking. A yearly low-dose CT scan of the lungs is recommended for people who have at least a 30-pack-year history of smoking and are a current smoker or have quit within the past 15 years. A pack year of smoking is smoking an average of 1 pack of cigarettes a day for 1 year (for example: 1 pack a day for 30 years or 2 packs a day for 15 years). Yearly screening should continue until the smoker has stopped smoking for at least 15 years. Yearly screening should be stopped for people who develop a health problem that would prevent them from having lung cancer treatment.  If you choose to drink alcohol, do not have more than  2 drinks per day. One drink is considered to be 12 ounces (355 mL) of beer, 5 ounces (148 mL) of wine, or 1.5 ounces (44 mL) of liquor.  Avoid use of street drugs. Do not share needles with anyone. Ask for help if you need support or instructions about stopping the use of drugs.  High blood pressure causes heart disease and increases the risk of stroke. Your blood pressure should be checked at least every 1-2 years. Ongoing high blood pressure should be treated with  medicines, if weight loss and exercise are not effective.  If you are 45-79 years old, ask your health care provider if you should take aspirin to prevent heart disease.  Diabetes screening involves taking a blood sample to check your fasting blood sugar level. This should be done once every 3 years, after age 45, if you are within normal weight and without risk factors for diabetes. Testing should be considered at a younger age or be carried out more frequently if you are overweight and have at least 1 risk factor for diabetes.  Colorectal cancer can be detected and often prevented. Most routine colorectal cancer screening begins at the age of 50 and continues through age 75. However, your health care provider may recommend screening at an earlier age if you have risk factors for colon cancer. On a yearly basis, your health care provider may provide home test kits to check for hidden blood in the stool. Use of a small camera at the end of a tube to directly examine the colon (sigmoidoscopy or colonoscopy) can detect the earliest forms of colorectal cancer. Talk to your health care provider about this at age 50, when routine screening begins. Direct exam of the colon should be repeated every 5-10 years through age 75, unless early forms of precancerous polyps or small growths are found.  People who are at an increased risk for hepatitis B should be screened for this virus. You are considered at high risk for hepatitis B if:  You were born in a country where hepatitis B occurs often. Talk with your health care provider about which countries are considered high risk.  Your parents were born in a high-risk country and you have not received a shot to protect against hepatitis B (hepatitis B vaccine).  You have HIV or AIDS.  You use needles to inject street drugs.  You live with, or have sex with, someone who has hepatitis B.  You are a man who has sex with other men (MSM).  You get hemodialysis  treatment.  You take certain medicines for conditions such as cancer, organ transplantation, and autoimmune conditions.  Hepatitis C blood testing is recommended for all people born from 1945 through 1965 and any individual with known risks for hepatitis C.  Practice safe sex. Use condoms and avoid high-risk sexual practices to reduce the spread of sexually transmitted infections (STIs). STIs include gonorrhea, chlamydia, syphilis, trichomonas, herpes, HPV, and human immunodeficiency virus (HIV). Herpes, HIV, and HPV are viral illnesses that have no cure. They can result in disability, cancer, and death.  If you are at risk of being infected with HIV, it is recommended that you take a prescription medicine daily to prevent HIV infection. This is called preexposure prophylaxis (PrEP). You are considered at risk if:  You are a man who has sex with other men (MSM) and have other risk factors.  You are a heterosexual man, are sexually active, and are at increased risk for HIV infection.    You take drugs by injection.  You are sexually active with a partner who has HIV.  Talk with your health care provider about whether you are at high risk of being infected with HIV. If you choose to begin PrEP, you should first be tested for HIV. You should then be tested every 3 months for as long as you are taking PrEP.  A one-time screening for abdominal aortic aneurysm (AAA) and surgical repair of large AAAs by ultrasound are recommended for men ages 32 to 67 years who are current or former smokers.  Healthy men should no longer receive prostate-specific antigen (PSA) blood tests as part of routine cancer screening. Talk with your health care provider about prostate cancer screening.  Testicular cancer screening is not recommended for adult males who have no symptoms. Screening includes self-exam, a health care provider exam, and other screening tests. Consult with your health care provider about any symptoms  you have or any concerns you have about testicular cancer.  Use sunscreen. Apply sunscreen liberally and repeatedly throughout the day. You should seek shade when your shadow is shorter than you. Protect yourself by wearing long sleeves, pants, a wide-brimmed hat, and sunglasses year round, whenever you are outdoors.  Once a month, do a whole-body skin exam, using a mirror to look at the skin on your back. Tell your health care provider about new moles, moles that have irregular borders, moles that are larger than a pencil eraser, or moles that have changed in shape or color.  Stay current with required vaccines (immunizations).  Influenza vaccine. All adults should be immunized every year.  Tetanus, diphtheria, and acellular pertussis (Td, Tdap) vaccine. An adult who has not previously received Tdap or who does not know his vaccine status should receive 1 dose of Tdap. This initial dose should be followed by tetanus and diphtheria toxoids (Td) booster doses every 10 years. Adults with an unknown or incomplete history of completing a 3-dose immunization series with Td-containing vaccines should begin or complete a primary immunization series including a Tdap dose. Adults should receive a Td booster every 10 years.  Varicella vaccine. An adult without evidence of immunity to varicella should receive 2 doses or a second dose if he has previously received 1 dose.  Human papillomavirus (HPV) vaccine. Males aged 68-21 years who have not received the vaccine previously should receive the 3-dose series. Males aged 22-26 years may be immunized. Immunization is recommended through the age of 6 years for any male who has sex with males and did not get any or all doses earlier. Immunization is recommended for any person with an immunocompromised condition through the age of 49 years if he did not get any or all doses earlier. During the 3-dose series, the second dose should be obtained 4-8 weeks after the first  dose. The third dose should be obtained 24 weeks after the first dose and 16 weeks after the second dose.  Zoster vaccine. One dose is recommended for adults aged 50 years or older unless certain conditions are present.  Measles, mumps, and rubella (MMR) vaccine. Adults born before [birth year] generally are considered immune to measles and mumps. Adults born in [birth year] or later should have 1 or more doses of MMR vaccine unless there is a contraindication to the vaccine or there is laboratory evidence of immunity to each of the three diseases. A routine second dose of MMR vaccine should be obtained at least 28 days after the first dose for students attending postsecondary  schools, health care workers, or international travelers. People who received inactivated measles vaccine or an unknown type of measles vaccine during 1963-1967 should receive 2 doses of MMR vaccine. People who received inactivated mumps vaccine or an unknown type of mumps vaccine before 1979 and are at high risk for mumps infection should consider immunization with 2 doses of MMR vaccine. Unvaccinated health care workers born before 1957 who lack laboratory evidence of measles, mumps, or rubella immunity or laboratory confirmation of disease should consider measles and mumps immunization with 2 doses of MMR vaccine or rubella immunization with 1 dose of MMR vaccine.  Pneumococcal 13-valent conjugate (PCV13) vaccine. When indicated, a person who is uncertain of his immunization history and has no record of immunization should receive the PCV13 vaccine. An adult aged 19 years or older who has certain medical conditions and has not been previously immunized should receive 1 dose of PCV13 vaccine. This PCV13 should be followed with a dose of pneumococcal polysaccharide (PPSV23) vaccine. The PPSV23 vaccine dose should be obtained at least 8 weeks after the dose of PCV13 vaccine. An adult aged 19 years or older who has certain medical conditions and  previously received 1 or more doses of PPSV23 vaccine should receive 1 dose of PCV13. The PCV13 vaccine dose should be obtained 1 or more years after the last PPSV23 vaccine dose.  Pneumococcal polysaccharide (PPSV23) vaccine. When PCV13 is also indicated, PCV13 should be obtained first. All adults aged 65 years and older should be immunized. An adult younger than age 65 years who has certain medical conditions should be immunized. Any person who resides in a nursing home or long-term care facility should be immunized. An adult smoker should be immunized. People with an immunocompromised condition and certain other conditions should receive both PCV13 and PPSV23 vaccines. People with human immunodeficiency virus (HIV) infection should be immunized as soon as possible after diagnosis. Immunization during chemotherapy or radiation therapy should be avoided. Routine use of PPSV23 vaccine is not recommended for American Indians, [place] Natives, or people younger than 65 years unless there are medical conditions that require PPSV23 vaccine. When indicated, people who have unknown immunization and have no record of immunization should receive PPSV23 vaccine. One-time revaccination 5 years after the first dose of PPSV23 is recommended for people aged 19-64 years who have chronic kidney failure, nephrotic syndrome, asplenia, or immunocompromised conditions. People who received 1-2 doses of PPSV23 before age 65 years should receive another dose of PPSV23 vaccine at age 65 years or later if at least 5 years have passed since the previous dose. Doses of PPSV23 are not needed for people immunized with PPSV23 at or after age 65 years.  Meningococcal vaccine. Adults with asplenia or persistent complement component deficiencies should receive 2 doses of quadrivalent meningococcal conjugate (MenACWY-D) vaccine. The doses should be obtained at least 2 months apart. Microbiologists working with certain meningococcal bacteria,  military recruits, people at risk during an outbreak, and people who travel to or live in countries with a high rate of meningitis should be immunized. A first-year college student up through age 21 years who is living in a residence hall should receive a dose if he did not receive a dose on or after his 16th birthday. Adults who have certain high-risk conditions should receive one or more doses of vaccine.  Hepatitis A vaccine. Adults who wish to be protected from this disease, have certain high-risk conditions, work with hepatitis A-infected animals, work in hepatitis A research labs, or   travel to or work in countries with a high rate of hepatitis A should be immunized. Adults who were previously unvaccinated and who anticipate close contact with an international adoptee during the first 60 days after arrival in the [place] from a country with a high rate of hepatitis A should be immunized.  Hepatitis B vaccine. Adults should be immunized if they wish to be protected from this disease, have certain high-risk conditions, may be exposed to blood or other infectious body fluids, are household contacts or sex partners of hepatitis B positive people, are clients or workers in certain care facilities, or travel to or work in countries with a high rate of hepatitis B.  Haemophilus influenzae type b (Hib) vaccine. A previously unvaccinated person with asplenia or sickle cell disease or having a scheduled splenectomy should receive 1 dose of Hib vaccine. Regardless of previous immunization, a recipient of a hematopoietic stem cell transplant should receive a 3-dose series 6-12 months after his successful transplant. Hib vaccine is not recommended for adults with HIV infection. Preventive Service / Frequency Ages 52 to 17  Blood pressure check.** / Every 1 to 2 years.  Lipid and cholesterol check.** / Every 5 years beginning at age 69.  Hepatitis C blood test.** / For any individual with known risks for  hepatitis C.  Skin self-exam. / Monthly.  Influenza vaccine. / Every year.  Tetanus, diphtheria, and acellular pertussis (Tdap, Td) vaccine.** / Consult your health care provider. 1 dose of Td every 10 years.  Varicella vaccine.** / Consult your health care provider.  HPV vaccine. / 3 doses over 6 months, if 72 or younger.  Measles, mumps, rubella (MMR) vaccine.** / You need at least 1 dose of MMR if you were born in 1957 or later. You may also need a second dose.  Pneumococcal 13-valent conjugate (PCV13) vaccine.** / Consult your health care provider.  Pneumococcal polysaccharide (PPSV23) vaccine.** / 1 to 2 doses if you smoke cigarettes or if you have certain conditions.  Meningococcal vaccine.** / 1 dose if you are age 35 to 60 years and a Market researcher living in a residence hall, or have one of several medical conditions. You may also need additional booster doses.  Hepatitis A vaccine.** / Consult your health care provider.  Hepatitis B vaccine.** / Consult your health care provider.  Haemophilus influenzae type b (Hib) vaccine.** / Consult your health care provider. Ages 35 to 8  Blood pressure check.** / Every 1 to 2 years.  Lipid and cholesterol check.** / Every 5 years beginning at age 57.  Lung cancer screening. / Every year if you are aged 44-80 years and have a 30-pack-year history of smoking and currently smoke or have quit within the past 15 years. Yearly screening is stopped once you have quit smoking for at least 15 years or develop a health problem that would prevent you from having lung cancer treatment.  Fecal occult blood test (FOBT) of stool. / Every year beginning at age 55 and continuing until age 73. You may not have to do this test if you get a colonoscopy every 10 years.  Flexible sigmoidoscopy** or colonoscopy.** / Every 5 years for a flexible sigmoidoscopy or every 10 years for a colonoscopy beginning at age 28 and continuing until age  1.  Hepatitis C blood test.** / For all people born from [birth year] through 1965 and any individual with known risks for hepatitis C.  Skin self-exam. / Monthly.  Influenza vaccine. / Every  year.  Tetanus, diphtheria, and acellular pertussis (Tdap/Td) vaccine.** / Consult your health care provider. 1 dose of Td every 10 years.  Varicella vaccine.** / Consult your health care provider.  Zoster vaccine.** / 1 dose for adults aged 53 years or older.  Measles, mumps, rubella (MMR) vaccine.** / You need at least 1 dose of MMR if you were born in 1957 or later. You may also need a second dose.  Pneumococcal 13-valent conjugate (PCV13) vaccine.** / Consult your health care provider.  Pneumococcal polysaccharide (PPSV23) vaccine.** / 1 to 2 doses if you smoke cigarettes or if you have certain conditions.  Meningococcal vaccine.** / Consult your health care provider.  Hepatitis A vaccine.** / Consult your health care provider.  Hepatitis B vaccine.** / Consult your health care provider.  Haemophilus influenzae type b (Hib) vaccine.** / Consult your health care provider. Ages 77 and over  Blood pressure check.** / Every 1 to 2 years.  Lipid and cholesterol check.**/ Every 5 years beginning at age 85.  Lung cancer screening. / Every year if you are aged 55-80 years and have a 30-pack-year history of smoking and currently smoke or have quit within the past 15 years. Yearly screening is stopped once you have quit smoking for at least 15 years or develop a health problem that would prevent you from having lung cancer treatment.  Fecal occult blood test (FOBT) of stool. / Every year beginning at age 33 and continuing until age 11. You may not have to do this test if you get a colonoscopy every 10 years.  Flexible sigmoidoscopy** or colonoscopy.** / Every 5 years for a flexible sigmoidoscopy or every 10 years for a colonoscopy beginning at age 28 and continuing until age 73.  Hepatitis C blood  test.** / For all people born from [birth year] through 1964 and any individual with known risks for hepatitis C.  Abdominal aortic aneurysm (AAA) screening.** / A one-time screening for ages 50 to 27 years who are current or former smokers.  Skin self-exam. / Monthly.  Influenza vaccine. / Every year.  Tetanus, diphtheria, and acellular pertussis (Tdap/Td) vaccine.** / 1 dose of Td every 10 years.  Varicella vaccine.** / Consult your health care provider.  Zoster vaccine.** / 1 dose for adults aged 34 years or older.  Pneumococcal 13-valent conjugate (PCV13) vaccine.** / Consult your health care provider.  Pneumococcal polysaccharide (PPSV23) vaccine.** / 1 dose for all adults aged 63 years and older.  Meningococcal vaccine.** / Consult your health care provider.  Hepatitis A vaccine.** / Consult your health care provider.  Hepatitis B vaccine.** / Consult your health care provider.  Haemophilus influenzae type b (Hib) vaccine.** / Consult your health care provider. **Family history and personal history of risk and conditions may change your health care provider's recommendations. Document Released: 11/02/2001 Document Revised: 09/11/2013 Document Reviewed: 02/01/2011 [facility] Patient Information 2015 [place], [place]. This information is not intended to replace advice given to you by your health care provider. Make sure you discuss any questions you have with your health care provider.

## 2015-02-28 NOTE — Progress Notes (Signed)
[name]  [id number] 1941-06-16 02/28/2015      Progress Note-Follow Up  Subjective  Chief Complaint  Chief Complaint  Patient presents with  . Annual Exam    HPI  Patient is a 74 y.o. male in today for routine medical care. Patient is in today accompanied by his wife for routine medical care and follow-up on numerous conditions. Feels well. No recent illness. Continues to be largely wheelchair-bound but does manage to help with his transfers and is able to maintain many of his own activities of daily living with minimal assistance from his wife. No recent illness or hospitalizations. Denies CP/palp/HA/congestion/fevers/GI or GU c/o. Taking meds as prescribed  Past Medical History  Diagnosis Date  . Valvular heart disease   . Aortic stenosis   . Osteoarthritis   . Anemia   . OSA (obstructive sleep apnea)   . Bursitis   . Gout   . Arthritis   . Hyperlipidemia   . Morbid obesity   . DDD (degenerative disc disease)   . Mixed sensory-motor polyneuropathy   . Peroneal palsy     significant right foot drop  . Hemorrhoids     hx of bleeding  . Hypertension   . History of shingles   . Measles as a child  . Chicken pox as a child    Past Surgical History  Procedure Laterality Date  . Total knee arthroplasty      right  . Spine surgery    . Laminotomy  1193    c6-t2  . Hernia repair      umbilical hernia  . Knee surgery Right     multiple knee surgeries due to complication of R TKA  . Colonoscopy N/A 02/07/2013    Procedure: COLONOSCOPY;  Surgeon: Juanita Craver, MD;  Location: [facility] ENDOSCOPY;  Service: Endoscopy;  Laterality: N/A;  . Esophagogastroduodenoscopy N/A 02/07/2013    Procedure: ESOPHAGOGASTRODUODENOSCOPY (EGD);  Surgeon: Juanita Craver, MD;  Location: [facility] ENDOSCOPY;  Service: Endoscopy;  Laterality: N/A;  . Colonoscopy N/A 02/09/2013    Procedure: COLONOSCOPY;  Surgeon: Beryle Beams, MD;  Location: [place];  Service: Endoscopy;  Laterality: N/A;  .  Givens capsule study N/A 02/09/2013    Procedure: GIVENS CAPSULE STUDY;  Surgeon: Beryle Beams, MD;  Location: [place];  Service: Endoscopy;  Laterality: N/A;    Family History  Problem Relation Age of Onset  . Arthritis Sister     "crippling"  . Arthritis Sister     History   Social History  . Marital Status: Married    Spouse Name: N/A  . Number of Children: N/A  . Years of Education: N/A   Occupational History  . retired Administrator    Social History Main Topics  . Smoking status: Former Smoker -- 30 years    Types: Cigarettes, Pipe    Quit date: 09/20/2006  . Smokeless tobacco: Never Used  . Alcohol Use: No  . Drug Use: No  . Sexual Activity: No     Comment: lives with wife, retired, no dietary restrictions   Other Topics Concern  . Not on file   Social History Narrative    Current Outpatient Prescriptions on File Prior to Visit  Medication Sig Dispense Refill  . albuterol (PROVENTIL HFA;VENTOLIN HFA) 108 (90 BASE) MCG/ACT inhaler Inhale 2 puffs into the lungs every 6 (six) hours as needed for wheezing or shortness of breath. 1 Inhaler 0  . allopurinol (ZYLOPRIM) 100 MG tablet TAKE 1  BY MOUTH DAILY 90 tablet 0  . beclomethasone (QVAR) 40 MCG/ACT inhaler Inhale 2 puffs into the lungs 2 (two) times daily. 1 Inhaler 1  . cholecalciferol (VITAMIN D) 1000 UNITS tablet Take 2,000 Units by mouth daily.     . furosemide (LASIX) 20 MG tablet Take 1 tablet (20 mg total) by mouth daily. 30 tablet 11  . loratadine (CLARITIN) 10 MG tablet Take 1 tablet (10 mg total) by mouth daily. 30 tablet 0  . Multiple Vitamin (MULTIVITAMIN WITH MINERALS) TABS Take 1 tablet by mouth daily.    . sennosides-docusate sodium (SENOKOT-S) 8.6-50 MG tablet Take 1 tablet by mouth 2 (two) times daily.     No current facility-administered medications on file prior to visit.    No Known Allergies  Review of Systems  Review of Systems  Constitutional: Positive for malaise/fatigue.  Negative for fever and chills.  HENT: Negative for congestion, hearing loss and nosebleeds.   Eyes: Negative for discharge.  Respiratory: Positive for shortness of breath. Negative for cough, sputum production and wheezing.   Cardiovascular: Negative for chest pain, palpitations and leg swelling.  Gastrointestinal: Negative for heartburn, nausea, vomiting, abdominal pain, diarrhea, constipation and blood in stool.  Genitourinary: Negative for dysuria, urgency, frequency and hematuria.  Musculoskeletal: Positive for back pain and neck pain. Negative for myalgias and falls.  Skin: Negative for rash.  Neurological: Negative for dizziness, tremors, sensory change, focal weakness, loss of consciousness, weakness and headaches.  Endo/Heme/Allergies: Negative for polydipsia. Does not bruise/bleed easily.  Psychiatric/Behavioral: Negative for depression and suicidal ideas. The patient is not nervous/anxious and does not have insomnia.     Objective  BP 132/82 mmHg  Pulse 75  Temp(Src) 98.6 F (37 C) (Oral)  Ht 6\' 2"  (1.88 m)  Wt 385 lb (174.635 kg)  BMI 49.41 kg/m2  SpO2 92%  Physical Exam  Physical Exam  Constitutional: He is oriented to person, place, and time and well-developed, well-nourished, and in no distress. No distress.  obese  HENT:  Head: Normocephalic and atraumatic.  Eyes: Conjunctivae are normal.  Neck: Neck supple. No thyromegaly present.  Cardiovascular: Normal rate and regular rhythm.   Murmur heard. Pulmonary/Chest: Effort normal and breath sounds normal. No respiratory distress.  Abdominal: Soft. Bowel sounds are normal. He exhibits no distension and no mass. There is no tenderness.  Musculoskeletal: He exhibits edema.  Neurological: He is alert and oriented to person, place, and time.  Skin: Skin is warm.  Psychiatric: Memory, affect and judgment normal.    Lab Results  Component Value Date   TSH 1.09 02/21/2015   Lab Results  Component Value Date   WBC  7.4 02/21/2015   HGB 11.5* 02/21/2015   HCT 35.6* 02/21/2015   MCV 88.0 02/21/2015   PLT 162.0 02/21/2015   Lab Results  Component Value Date   CREATININE 1.13 02/21/2015   BUN 26* 02/21/2015   NA 139 02/21/2015   K 5.2* 02/21/2015   CL 103 02/21/2015   CO2 32 02/21/2015   Lab Results  Component Value Date   ALT 19 02/21/2015   AST 18 02/21/2015   ALKPHOS 81 02/21/2015   BILITOT 0.5 02/21/2015   Lab Results  Component Value Date   CHOL 118 02/21/2015   Lab Results  Component Value Date   HDL 29.30* 02/21/2015   Lab Results  Component Value Date   LDLCALC 72 02/21/2015   Lab Results  Component Value Date   TRIG 84.0 02/21/2015   Lab Results  Component Value Date   CHOLHDL 4 02/21/2015     Assessment & Plan  VALVULAR HEART DISEASE Has a TEE with Dr [name] in next week. Symptoms much better since respiratory illness has resolved  Multilevel degenerative disc disease Manages but is minimally ambulatory. Allowed refill on Tramadol to use prn  OBESITY, MORBID Encouraged DASH diet, decrease po intake and increase exercise as tolerated. Needs 7-8 hours of sleep nightly. Avoid trans fats, eat small, frequent meals every 4-5 hours with lean proteins, complex carbs and healthy fats. Minimize simple carbs  HYPERCHOLESTEROLEMIA Encouraged heart healthy diet, increase exercise, avoid trans fats, consider a krill oil cap daily  Gout Stable on Allopurinol  Medicare annual wellness visit, subsequent Patient denies any difficulties at home. No trouble with ADLs, depression or falls. No recent changes to vision or hearing. Is UTD with immunizations. Is UTD with screening. Discussed Advanced Directives, patient agrees to bring [name] copies of documents if can. Encouraged heart healthy diet, exercise as tolerated and adequate sleep. Labs reviewed. Follows with Dr Sabra Heck of opthamology Dr Benson Norway of gastroenterology Dr Annamaria Boots of Pulmonology Dr [name] of cardiology See problem  list for risk factors.  See AVS for health maintenance/preventative healthcare recommendations.

## 2015-03-01 LAB — COMPREHENSIVE METABOLIC PANEL
ALT: 17 U/L (ref 0–53)
AST: 18 U/L (ref 0–37)
Albumin: 3.7 g/dL (ref 3.5–5.2)
Alkaline Phosphatase: 85 U/L (ref 39–117)
BUN: 33 mg/dL — ABNORMAL HIGH (ref 6–23)
CO2: 25 mEq/L (ref 19–32)
Calcium: 9.3 mg/dL (ref 8.4–10.5)
Chloride: 102 mEq/L (ref 96–112)
Creat: 1.19 mg/dL (ref 0.50–1.35)
Glucose, Bld: 101 mg/dL — ABNORMAL HIGH (ref 70–99)
Potassium: 5.4 mEq/L — ABNORMAL HIGH (ref 3.5–5.3)
Sodium: 144 mEq/L (ref 135–145)
Total Bilirubin: 0.4 mg/dL (ref 0.2–1.2)
Total Protein: 6.2 g/dL (ref 6.0–8.3)

## 2015-03-03 ENCOUNTER — Telehealth: Payer: Self-pay | Admitting: Family Medicine

## 2015-03-03 DIAGNOSIS — E875 Hyperkalemia: Secondary | ICD-10-CM

## 2015-03-03 NOTE — Telephone Encounter (Signed)
Lab appt. Scheduled and entered cmp.

## 2015-03-06 ENCOUNTER — Telehealth: Payer: Self-pay | Admitting: *Deleted

## 2015-03-06 NOTE — Telephone Encounter (Signed)
Prior authorization for Neurontin initiated. Awaiting determination. JG//CMA

## 2015-03-06 NOTE — Telephone Encounter (Signed)
Almyra Free from [employer] states that this has been approved from 03/06/15-03/05/16. Will be sending a letter out to patient and to our office.  [phone], if we have any questions.

## 2015-03-06 NOTE — Telephone Encounter (Signed)
initiated

## 2015-03-07 ENCOUNTER — Encounter (HOSPITAL_COMMUNITY)
Admission: RE | Disposition: A | Discharge status: Home / Self Care | Payer: Self-pay | Source: Ambulatory Visit | Attending: Cardiovascular Disease

## 2015-03-07 ENCOUNTER — Ambulatory Visit (HOSPITAL_COMMUNITY)
Admission: RE | Admit: 2015-03-07 | Discharge: 2015-03-07 | Disposition: A | Discharge status: Home / Self Care | Payer: Medicare Other | Source: Ambulatory Visit | Attending: Cardiovascular Disease | Admitting: Cardiovascular Disease

## 2015-03-07 ENCOUNTER — Ambulatory Visit (HOSPITAL_BASED_OUTPATIENT_CLINIC_OR_DEPARTMENT_OTHER)
Admission: RE | Admit: 2015-03-07 | Discharge: 2015-03-07 | Disposition: A | Discharge status: Home / Self Care | Payer: Medicare Other | Source: Ambulatory Visit | Attending: Cardiovascular Disease | Admitting: Cardiovascular Disease

## 2015-03-07 ENCOUNTER — Ambulatory Visit (HOSPITAL_COMMUNITY): Payer: Medicare Other | Admitting: Anesthesiology

## 2015-03-07 ENCOUNTER — Encounter (HOSPITAL_COMMUNITY): Payer: Self-pay

## 2015-03-07 DIAGNOSIS — I351 Nonrheumatic aortic (valve) insufficiency: Secondary | ICD-10-CM | POA: Diagnosis not present

## 2015-03-07 DIAGNOSIS — D649 Anemia, unspecified: Secondary | ICD-10-CM | POA: Diagnosis not present

## 2015-03-07 DIAGNOSIS — Z6841 Body Mass Index (BMI) 40.0 and over, adult: Secondary | ICD-10-CM | POA: Diagnosis not present

## 2015-03-07 DIAGNOSIS — I872 Venous insufficiency (chronic) (peripheral): Secondary | ICD-10-CM | POA: Diagnosis present

## 2015-03-07 DIAGNOSIS — Z79899 Other long term (current) drug therapy: Secondary | ICD-10-CM | POA: Insufficient documentation

## 2015-03-07 DIAGNOSIS — I252 Old myocardial infarction: Secondary | ICD-10-CM | POA: Insufficient documentation

## 2015-03-07 DIAGNOSIS — G4730 Sleep apnea, unspecified: Secondary | ICD-10-CM | POA: Insufficient documentation

## 2015-03-07 DIAGNOSIS — I739 Peripheral vascular disease, unspecified: Secondary | ICD-10-CM | POA: Diagnosis not present

## 2015-03-07 DIAGNOSIS — I350 Nonrheumatic aortic (valve) stenosis: Secondary | ICD-10-CM

## 2015-03-07 DIAGNOSIS — Z87891 Personal history of nicotine dependence: Secondary | ICD-10-CM | POA: Diagnosis not present

## 2015-03-07 DIAGNOSIS — I10 Essential (primary) hypertension: Secondary | ICD-10-CM | POA: Insufficient documentation

## 2015-03-07 HISTORY — PX: TEE WITHOUT CARDIOVERSION: SHX5443

## 2015-03-07 SURGERY — ECHOCARDIOGRAM, TRANSESOPHAGEAL
Anesthesia: Monitor Anesthesia Care

## 2015-03-07 MED ORDER — MIDAZOLAM HCL 5 MG/ML IJ SOLN
INTRAMUSCULAR | Status: AC
  Filled 2015-03-07: qty 1

## 2015-03-07 MED ORDER — SODIUM CHLORIDE 0.9 % IV SOLN
500.0000 mg | INTRAVENOUS | Status: DC | PRN
  Administered 2015-03-07: 10 ug/kg/min via INTRAVENOUS

## 2015-03-07 MED ORDER — MIDAZOLAM HCL 10 MG/2ML IJ SOLN
INTRAMUSCULAR | Status: DC | PRN
  Administered 2015-03-07: 1 mg via INTRAVENOUS

## 2015-03-07 MED ORDER — SODIUM CHLORIDE 0.9 % IV SOLN: INTRAVENOUS | Status: DC

## 2015-03-07 MED ORDER — SODIUM CHLORIDE 0.9 % IV SOLN
INTRAVENOUS | Status: DC | PRN
  Administered 2015-03-07: 08:00:00 via INTRAVENOUS

## 2015-03-07 MED ORDER — SODIUM CHLORIDE 0.9 % IV SOLN
INTRAVENOUS | Status: DC
  Administered 2015-03-07: 500 mL via INTRAVENOUS

## 2015-03-07 NOTE — H&P (Signed)
Josue Hector, MD at 01/03/2015 12:12 PM     Status: Signed       Expand All Collapse All   Patient ID: [name], male DOB: 1941-08-10, 74 y.o. MRN: [id number]   73 y.o. with chronic venous insufficiency on lasix 40mg  alt with 20 mg, HTN and elevated lipids Last seen in 2011 for AS He is morbidly obese and mostly in a wheel chair. 5/14 was hospitalized for 16 unit bleed Source not clear but had upper/lower endoscopy and capsule. During hospitalization has ? 6 second pause vs leads coming off telemetry Patient Indicates he was fine. His Aortic Valve cannot be seen well by TTE. I last did TEE on him in 2011  Severe LVH peak gradient 97 mmHg Mean gradient 1mmHg EF normal moderate AR Trivial MR Had 50ug fentanyl and 6 mg versed.   Recently Rx by primary for allergies and URI with zpack and inhalers. CXR with small bilateral effusion no , chest pain or syncope Has RLE foot drop from previous surgery. Had long rehab after GI bleed just to be able to use walker again. Discussed need for f/u TEE and need for anesthesia to be present to monitor airway. He prefers to wait and not take any risks or have more procedures as he is still trying to recover from everything done last hospitalization. We also discussed the newer Rx modality of TAVR which I think we should move toward . The valve may be hard to size by CT as well as getting his peripheral vessels sized   He is willing to have TEE in June Brother in law is dying of lung issues and son having some cancer issues. We need accurate weight on him he thinks its 400 lbs  May be some issues with pre TAVR CT scanning      ROS: Denies fever, malais, weight loss, blurry vision, decreased visual acuity, cough, sputum, SOB, hemoptysis, pleuritic pain, palpitaitons, heartburn, abdominal pain, melena, lower extremity edema, claudication, or rash. All other systems reviewed and negative   General: Affect appropriate Obese  white male in wheel chair  HEENT: normal Neck supple with no adenopathy JVP normal no bruits no thyromegaly Lungs clear with no wheezing and good diaphragmatic motion Heart: S1/S2 AS/AR murmur,rub, gallop or click PMI normal Abdomen: benighn, BS positve, no tenderness, no AAA no bruit. No HSM or HJR Distal pulses intact with no bruits Plus 2-3 chronic edema with venous stasis  Neuro non-focal Skin warm and dry No muscular weakness  Medications Current Outpatient Prescriptions  Medication Sig Dispense Refill  . albuterol (PROVENTIL HFA;VENTOLIN HFA) 108 (90 BASE) MCG/ACT inhaler Inhale 2 puffs into the lungs every 6 (six) hours as needed for wheezing or shortness of breath. 1 Inhaler 0  . allopurinol (ZYLOPRIM) 100 MG tablet TAKE 1 BY MOUTH DAILY 90 tablet 0  . atorvastatin (LIPITOR) 40 MG tablet TAKE 1 BY MOUTH DAILY 90 tablet 1  . beclomethasone (QVAR) 40 MCG/ACT inhaler Inhale 2 puffs into the lungs 2 (two) times daily. 1 Inhaler 1  . cholecalciferol (VITAMIN D) 1000 UNITS tablet Take 2,000 Units by mouth daily.     . fluticasone (CUTIVATE) 0.05 % cream Apply topically 2 (two) times daily. Apply to lower legs twice daily as needed 60 g 0  . fluticasone (FLONASE) 50 MCG/ACT nasal spray Place 2 sprays into both nostrils daily. 16 g 0  . furosemide (LASIX) 20 MG tablet Take 1 tablet (20 mg total) by mouth daily. 14 tablet  0  . gabapentin (NEURONTIN) 300 MG capsule TAKE 1 BY MOUTH 3 TIMES DAILY 243 capsule 0  . lisinopril (PRINIVIL,ZESTRIL) 10 MG tablet TAKE 1 BY MOUTH DAILY 90 tablet 1  . loratadine (CLARITIN) 10 MG tablet Take 1 tablet (10 mg total) by mouth daily. 30 tablet 0  . Multiple Vitamin (MULTIVITAMIN WITH MINERALS) TABS Take 1 tablet by mouth daily.    . sennosides-docusate sodium (SENOKOT-S) 8.6-50 MG tablet Take 1 tablet by mouth 2 (two) times daily.    . traMADol (ULTRAM) 50 MG tablet TAKE 1 TABLET  BY MOUTH EVERY 6 HOURS AS NEEDED 90 tablet 0   No current facility-administered medications for this visit.    Allergies Review of patient's allergies indicates no known allergies.  Family History: Family History  Problem Relation Age of Onset  . Arthritis Sister     "crippling"  . Arthritis Sister     Social History: History   Social History  . Marital Status: Married    Spouse Name: N/A  . Number of Children: N/A  . Years of Education: N/A   Occupational History  . retired Administrator    Social History Main Topics  . Smoking status: Former Smoker -- 30 years    Types: Cigarettes, Pipe    Quit date: 09/20/2006  . Smokeless tobacco: Never Used  . Alcohol Use: No  . Drug Use: No  . Sexual Activity: Not on file   Other Topics Concern  . Not on file   Social History Narrative    Electrocardiogram: SR PR 222 first degree ICRBBB 02/17/13 01/14/14 SR rate 70 PR 218 RBBB 4/18 SR rate 72 Short burst atrial arrhythmia RBBB   Assessment and Plan              Aortic valve disorder - Josue Hector, MD at 01/06/2015 11:37 AM     Status: Written Related Problem: Aortic valve disorder   Expand All Collapse All   Likely critical AS not well characterized in 5 years. TEE for 6/17 Risks including intubation discussed Will have anesthesia present for propofol Once we have TEE can discuss with Dr Roxy Manns and Burt Knack if they would even want to consider TAVR and proceed with CTA             Chronic venous insufficiency - Josue Hector, MD at 01/06/2015 11:37 AM     Status: Written Related Problem: Chronic venous insufficiency   Expand All Collapse All   No skin breakdown continue lasix             Acute lower gastrointestinal bleeding - Josue Hector, MD at 01/06/2015 11:37 AM     Status: Written Related Problem: Acute lower gastrointestinal bleeding    Expand All Collapse All   Stable Hb improved f/u with primary

## 2015-03-07 NOTE — Transfer of Care (Signed)
Immediate Anesthesia Transfer of Care Note  Patient: [name]  Procedure(s) Performed: Procedure(s) with comments: TRANSESOPHAGEAL ECHOCARDIOGRAM (TEE) (N/A) - ANES TO BRING PROPOFOL PER DOCTOR  Patient Location: Endoscopy Unit  Anesthesia Type:MAC  Level of Consciousness: sedated  Airway & Oxygen Therapy: Patient Spontanous Breathing and Patient connected to face mask oxygen  Post-op Assessment: Report given to RN, Post -op Vital signs reviewed and stable and Patient moving all extremities X 4  Post vital signs: Reviewed and stable  Last Vitals:  Filed Vitals:   03/07/15 0759  BP: 122/66  Pulse: 71  Temp: 36.8 C  Resp: 15    Complications: No apparent anesthesia complications

## 2015-03-07 NOTE — Anesthesia Preprocedure Evaluation (Addendum)
Anesthesia Evaluation  Patient identified by MRN, date of birth, ID band Patient awake    Reviewed: Allergy & Precautions, H&P , NPO status , Patient's Chart, lab work & pertinent test results  Airway Mallampati: III  TM Distance: >3 FB     Dental  (+) Teeth Intact, Dental Advidsory Given   Pulmonary shortness of breath, sleep apnea and Continuous Positive Airway Pressure Ventilation , neg COPDformer smoker,  breath sounds clear to auscultation        Cardiovascular hypertension, Pt. on medications - angina+ Peripheral Vascular Disease - Past MI + Valvular Problems/Murmurs AS Rhythm:Regular + Systolic murmurs    Neuro/Psych  Neuromuscular disease    GI/Hepatic negative GI ROS, Neg liver ROS,   Endo/Other  Morbid obesity  Renal/GU Renal InsufficiencyRenal disease     Musculoskeletal  (+) Arthritis -,   Abdominal   Peds  Hematology  (+) anemia ,   Anesthesia Other Findings   Reproductive/Obstetrics                            Anesthesia Physical Anesthesia Plan  ASA: III  Anesthesia Plan: MAC   Post-op Pain Management:    Induction: Intravenous  Airway Management Planned: Nasal Cannula  Additional Equipment: None  Intra-op Plan:   Post-operative Plan:   Informed Consent: I have reviewed the patients History and Physical, chart, labs and discussed the procedure including the risks, benefits and alternatives for the proposed anesthesia with the patient or authorized representative who has indicated his/her understanding and acceptance.   Dental Advisory Given  Plan Discussed with: Anesthesiologist, CRNA and Surgeon  Anesthesia Plan Comments:        Anesthesia Quick Evaluation

## 2015-03-07 NOTE — Discharge Instructions (Signed)
Transesophageal Echocardiogram °Transesophageal echocardiography (TEE) is a picture test of your heart using sound waves. The pictures taken can give very detailed pictures of your heart. This can help your doctor see if there are problems with your heart. TEE can check: °· If your heart has blood clots in it. °· How well your heart valves are working. °· If you have an infection on the inside of your heart. °· Some of the major arteries of your heart. °· If your heart valve is working after a repair. °· Your heart before a procedure that uses a shock to your heart to get the rhythm back to normal. °BEFORE THE PROCEDURE °· Do not eat or drink for 6 hours before the procedure or as told by your doctor. °· Make plans to have someone drive you home after the procedure. Do not drive yourself home. °· An IV tube will be put in your arm. °PROCEDURE °· You will be given a medicine to help you relax (sedative). It will be given through the IV tube. °· A numbing medicine will be sprayed or gargled in the back of your throat to help numb it. °· The tip of the probe is placed into the back of your mouth. You will be asked to swallow. This helps to pass the probe into your esophagus. °· Once the tip of the probe is in the right place, your doctor can take pictures of your heart. °· You may feel pressure at the back of your throat. °AFTER THE PROCEDURE °· You will be taken to a recovery area so the sedative can wear off. °· Your throat may be sore and scratchy. This will go away slowly over time. °· You will go home when you are fully awake and able to swallow liquids. °· You should have someone stay with you for the next 24 hours. °· Do not drive or operate machinery for the next 24 hours. °Document Released: 07/04/2009 Document Revised: 09/11/2013 Document Reviewed: 03/08/2013 °[employer]® Patient Information ©2015 [employer]. This information is not intended to replace advice given to you by your health care provider. Make  sure you discuss any questions you have with your health care provider. ° °

## 2015-03-07 NOTE — Anesthesia Procedure Notes (Signed)
Procedure Name: MAC Date/Time: 03/07/2015 9:10 AM Performed by: Neldon Newport Pre-anesthesia Checklist: Patient identified, Emergency Drugs available, Suction available, Patient being monitored and Timeout performed Patient Re-evaluated:Patient Re-evaluated prior to inductionOxygen Delivery Method: Nasal cannula

## 2015-03-07 NOTE — CV Procedure (Signed)
TEE: Anesthesia:  Ketamine drip and 40 mg propofol  LVH EF normal 60% Severely calcified trileaflet AV critical AS and severe AR.  Mean gradient over 50 mmHg and peak velocity close to 54m/sec even with difficult deep transgastric images MAC mild MR Normal RV No ASD No LAA thrombus No aortic debris  Will have patient see Dr Roxy Manns and Burt Knack Degree of [place] may preclude TAVR  His lack of functional status And morbid obesity make him high risk for open procedure  Jenkins Rouge

## 2015-03-07 NOTE — Progress Notes (Signed)
  Echocardiogram Echocardiogram Transesophageal has been performed.  [name] 03/07/2015, 10:03 AM

## 2015-03-07 NOTE — Progress Notes (Signed)
Recovery phases complete ,patient alert and orientated due to dizziness patient resting in recovery until he feels able to dress and ambulate with walker.

## 2015-03-07 NOTE — Anesthesia Postprocedure Evaluation (Signed)
  Anesthesia Post-op Note  Patient: [name]  Procedure(s) Performed: Procedure(s) with comments: TRANSESOPHAGEAL ECHOCARDIOGRAM (TEE) (N/A) - ANES TO BRING PROPOFOL PER DOCTOR  Patient Location: Endoscopy Unit  Anesthesia Type:MAC  Level of Consciousness: awake  Airway and Oxygen Therapy: Patient Spontanous Breathing  Post-op Pain: none  Post-op Assessment: Post-op Vital signs reviewed, Patient's Cardiovascular Status Stable, Respiratory Function Stable, Patent Airway, No signs of Nausea or vomiting and Pain level controlled              Post-op Vital Signs: Reviewed and stable  Last Vitals:  Filed Vitals:   03/07/15 1030  BP: 118/60  Pulse: 60  Temp:   Resp: 12    Complications: No apparent anesthesia complications

## 2015-03-09 ENCOUNTER — Encounter: Payer: Self-pay | Admitting: Family Medicine

## 2015-03-09 DIAGNOSIS — Z0000 Encounter for general adult medical examination without abnormal findings: Secondary | ICD-10-CM | POA: Insufficient documentation

## 2015-03-09 NOTE — Assessment & Plan Note (Signed)
Encouraged heart healthy diet, increase exercise, avoid trans fats, consider a krill oil cap daily 

## 2015-03-09 NOTE — Assessment & Plan Note (Signed)
Patient denies any difficulties at home. No trouble with ADLs, depression or falls. No recent changes to vision or hearing. Is UTD with immunizations. Is UTD with screening. Discussed Advanced Directives, patient agrees to bring [name] copies of documents if can. Encouraged heart healthy diet, exercise as tolerated and adequate sleep. Labs reviewed. Follows with Dr Sabra Heck of opthamology Dr Benson Norway of gastroenterology Dr Annamaria Boots of Pulmonology Dr [name] of cardiology See problem list for risk factors.  See AVS for health maintenance/preventative healthcare recommendations.

## 2015-03-09 NOTE — Assessment & Plan Note (Signed)
Encouraged DASH diet, decrease po intake and increase exercise as tolerated. Needs 7-8 hours of sleep nightly. Avoid trans fats, eat small, frequent meals every 4-5 hours with lean proteins, complex carbs and healthy fats. Minimize simple carbs 

## 2015-03-09 NOTE — Assessment & Plan Note (Signed)
Manages but is minimally ambulatory. Allowed refill on Tramadol to use prn

## 2015-03-09 NOTE — Assessment & Plan Note (Signed)
Stable on Allopurinol.

## 2015-03-10 ENCOUNTER — Encounter (HOSPITAL_COMMUNITY): Payer: Self-pay | Admitting: Cardiovascular Disease

## 2015-03-10 ENCOUNTER — Ambulatory Visit: Payer: Medicare Other | Admitting: Cardiovascular Disease

## 2015-03-17 ENCOUNTER — Other Ambulatory Visit: Payer: Self-pay

## 2015-03-17 ENCOUNTER — Other Ambulatory Visit: Payer: Self-pay | Admitting: Family Medicine

## 2015-03-17 ENCOUNTER — Other Ambulatory Visit (INDEPENDENT_AMBULATORY_CARE_PROVIDER_SITE_OTHER): Payer: Medicare Other

## 2015-03-17 DIAGNOSIS — E875 Hyperkalemia: Secondary | ICD-10-CM

## 2015-03-17 LAB — COMPREHENSIVE METABOLIC PANEL
ALT: 17 U/L (ref 0–53)
AST: 20 U/L (ref 0–37)
Albumin: 3.7 g/dL (ref 3.5–5.2)
Alkaline Phosphatase: 82 U/L (ref 39–117)
BUN: 30 mg/dL — ABNORMAL HIGH (ref 6–23)
CO2: 31 mEq/L (ref 19–32)
Calcium: 9.4 mg/dL (ref 8.4–10.5)
Chloride: 102 mEq/L (ref 96–112)
Creatinine, Ser: 1.11 mg/dL (ref 0.40–1.50)
GFR: 69.09 mL/min (ref >60.00)
Glucose, Bld: 90 mg/dL (ref 70–99)
Potassium: 4.3 mEq/L (ref 3.5–5.1)
Sodium: 138 mEq/L (ref 135–145)
Total Bilirubin: 0.4 mg/dL (ref 0.2–1.2)
Total Protein: 6.6 g/dL (ref 6.0–8.3)

## 2015-03-19 ENCOUNTER — Encounter: Payer: Self-pay | Admitting: Cardiovascular Disease

## 2015-03-19 ENCOUNTER — Ambulatory Visit (INDEPENDENT_AMBULATORY_CARE_PROVIDER_SITE_OTHER): Payer: Medicare Other | Admitting: Cardiovascular Disease

## 2015-03-19 ENCOUNTER — Telehealth: Payer: Self-pay | Admitting: Family Medicine

## 2015-03-19 VITALS — BP 110/70 | HR 64 | Ht 74.0 in | Wt 381.0 lb

## 2015-03-19 DIAGNOSIS — I350 Nonrheumatic aortic (valve) stenosis: Secondary | ICD-10-CM | POA: Diagnosis not present

## 2015-03-19 NOTE — Progress Notes (Signed)
Chief Complaint  Patient presents with  . Shortness of Breath      History of Present Illness: 74 yo male with history of morbid obesity, mild HTN, HLD, gout and aortic stenosis who is referred today by Dr. [name]. He has a history of prior GI bleeding with large volume blood loss in 2014. He was found to have cecal AVMs treated with ablation. He has had no bleeding since then. He is confined to a wheelchair most of the time and has episodes of gout very often. He does walk short distances around the house. He has foot drop following his knee surgery in 2008. He has had many issues with his right knee following his surgery. He has a prior cervical spine surgery with diagnosis of spinal cord tumor. He believes that he has neurofibromatosis and his sons have multiple tumors consistent with this as well. He has been followed for aortic valve stenosis for many years. Echo in 2011 with severe AS and moderate AI. He has had no change in his symptoms over the last few years. He has baseline SOB but no real change. No chest pain. He weight is stable at 380 lbs. He was told in 2011 that he was not a candidate for AVR given his obesity. He was recently seen by Dr. [name] and given his severe AS, he would like to explore the option of TAVR. TEE 03/07/15 with normal LV function, severe AS with mean gradient of 50 mmHg and peak gradient of 100 mmHg. There was also severe AI.   He is a retired Administrator. As above, he is not mobile. He is mostly confined to a wheelchair. He has no change in breathing although he is dyspneic when he does walk. He has no chest pain. He has chronic severe lower extremity edema.   Primary Care Physician: Penni Homans  Past Medical History  Diagnosis Date  . Valvular heart disease   . Aortic stenosis   . Osteoarthritis   . Anemia   . OSA (obstructive sleep apnea)   . Bursitis   . Gout   . Arthritis   . Hyperlipidemia   . Morbid obesity   . DDD (degenerative disc disease)    . Mixed sensory-motor polyneuropathy   . Peroneal palsy     significant right foot drop  . Hemorrhoids     hx of bleeding  . Hypertension   . History of shingles   . Measles as a child  . Chicken pox as a child  . Medicare annual wellness visit, subsequent 03/09/2015  . Preventative health care 03/09/2015    Past Surgical History  Procedure Laterality Date  . Total knee arthroplasty      right  . Spine surgery    . Laminotomy  1193    c6-t2  . Hernia repair      umbilical hernia  . Knee surgery Right     multiple knee surgeries due to complication of R TKA  . Colonoscopy N/A 02/07/2013    Procedure: COLONOSCOPY;  Surgeon: Juanita Craver, MD;  Location: [facility] ENDOSCOPY;  Service: Endoscopy;  Laterality: N/A;  . Esophagogastroduodenoscopy N/A 02/07/2013    Procedure: ESOPHAGOGASTRODUODENOSCOPY (EGD);  Surgeon: Juanita Craver, MD;  Location: [facility] ENDOSCOPY;  Service: Endoscopy;  Laterality: N/A;  . Colonoscopy N/A 02/09/2013    Procedure: COLONOSCOPY;  Surgeon: Beryle Beams, MD;  Location: [facility];  Service: Endoscopy;  Laterality: N/A;  . Givens capsule study N/A 02/09/2013    Procedure:  GIVENS CAPSULE STUDY;  Surgeon: Beryle Beams, MD;  Location: [facility] ENDOSCOPY;  Service: Endoscopy;  Laterality: N/A;  . Tee without cardioversion N/A 03/07/2015    Procedure: TRANSESOPHAGEAL ECHOCARDIOGRAM (TEE);  Surgeon: Josue Hector, MD;  Location: [facility] ENDOSCOPY;  Service: Cardiovascular;  Laterality: N/A;  ANES TO BRING PROPOFOL PER DOCTOR    Current Outpatient Prescriptions  Medication Sig Dispense Refill  . acetaminophen (TYLENOL) 500 MG tablet Take 500 mg by mouth every 6 (six) hours as needed.    [name] allopurinol (ZYLOPRIM) 100 MG tablet TAKE 1 BY MOUTH DAILY 90 tablet 0  . atorvastatin (LIPITOR) 40 MG tablet TAKE 1 BY MOUTH DAILY 90 tablet 2  . cholecalciferol (VITAMIN D) 1000 UNITS tablet Take 2,000 Units by mouth daily.     . Coenzyme Q10 (COQ10) 200 MG CAPS Take 200 mg by mouth daily.    .  furosemide (LASIX) 20 MG tablet Take 1 tablet (20 mg total) by mouth daily. 30 tablet 11  . gabapentin (NEURONTIN) 300 MG capsule TAKE 1 BY MOUTH 3 TIMES DAILY 270 capsule 1  . lisinopril (PRINIVIL,ZESTRIL) 10 MG tablet TAKE 1 BY MOUTH DAILY 90 tablet 2  . Multiple Vitamin (MULTIVITAMIN WITH MINERALS) TABS Take 1 tablet by mouth daily.    . sennosides-docusate sodium (SENOKOT-S) 8.6-50 MG tablet Take 1 tablet by mouth 2 (two) times daily.    . traMADol (ULTRAM) 50 MG tablet Take 1 tablet (50 mg total) by mouth every 6 (six) hours as needed. 90 tablet 0  . loratadine (CLARITIN) 10 MG tablet Take 1 tablet (10 mg total) by mouth daily. (Patient not taking: Reported on 03/19/2015) 30 tablet 0   No current facility-administered medications for this visit.    No Known Allergies  History   Social History  . Marital Status: Married    Spouse Name: N/A  . Number of Children: N/A  . Years of Education: N/A   Occupational History  . retired Administrator    Social History Main Topics  . Smoking status: Former Smoker -- 30 years    Types: Cigarettes, Pipe    Quit date: 09/20/2006  . Smokeless tobacco: Never Used  . Alcohol Use: No  . Drug Use: No  . Sexual Activity: No     Comment: lives with wife, retired, no dietary restrictions   Other Topics Concern  . Not on file   Social History Narrative    Family History  Problem Relation Age of Onset  . Arthritis Sister     "crippling"  . Arthritis Sister     Review of Systems:  As stated in the HPI and otherwise negative.   BP 110/70 mmHg  Pulse 64  Ht 6\' 2"  (1.88 m)  Wt 381 lb (172.82 kg)  BMI 48.90 kg/m2  Physical Examination: General: Morbidly obese WM in NAD HEENT: OP clear, mucus membranes moist SKIN: warm, dry. No rashes. Neuro: No focal deficits Musculoskeletal: Muscle strength 5/5 all ext Psychiatric: Mood and affect normal Neck: unable to assess for JVD, no carotid bruits. Lungs:Clear bilaterally, no wheezes,  rhonci, crackles Cardiovascular: Regular rate and rhythm. Loud harsh systolic murmur. No gallops or rubs. Abdomen:Soft. Bowel sounds present. Non-tender.  Extremities: 1-2 + bilateral lower extremity edema with chronic venous stasis changes. Pulses are 2 + in the bilateral DP/PT.  TEE March 07, 2015: Impressions: Normal EF 60% Mild MR Normal RV No LAA thrombus No aortic debris NO ASD  Aortic valve trileaflet and severely calcified with restricted motion  Critical AS with mean gradient close to 50 mmHg and peak near 100 mmHg even with suboptimal transgastric signals. Also severe AR  EKG:  EKG is not ordered today. The ekg ordered today demonstrates   Recent Labs: 12/06/2014: Pro B Natriuretic peptide (BNP) 289.0* 02/21/2015: Hemoglobin 11.5*; Platelets 162.0; TSH 1.09 03/17/2015: ALT 17; BUN 30*; Creatinine, Ser 1.11; Potassium 4.3; Sodium 138   Lipid Panel    Component Value Date/Time   CHOL 118 02/21/2015 0921   TRIG 84.0 02/21/2015 0921   HDL 29.30* 02/21/2015 0921   CHOLHDL 4 02/21/2015 0921   VLDL 16.8 02/21/2015 0921   LDLCALC 72 02/21/2015 0921     Wt Readings from Last 3 Encounters:  03/19/15 381 lb (172.82 kg)  03/07/15 380 lb (172.367 kg)  02/28/15 385 lb (174.635 kg)     Other studies Reviewed: Additional studies/ records that were reviewed today include: TEE images. Review of the above records demonstrates: severe AS, severe AI   Assessment and Plan:   1. Severe aortic valve stenosis: He clearly has severe AS by recent TEE. This has been present for at least the last five years. He is mostly sedentary due to his morbid obesity, knee pain and foot drop with gout. He would not be considered for traditional AVR given his morbid obesity with inability to rehab following surgery. I am not sure that he is even a candidate for TAVR. A diagnostic heart catheterization would be difficult as his weight is borderline for the cath table. I will check  to see the weight limits. I am also not sure that we could get a Cardiac CT given his size. While he is not an optimal candidate for any procedures, I will continue to explore this idea by getting a surgical evaluation from Dr. Roxy Manns or Dr. Cyndia Bent. I will not arrange any other tests until I have discussed this with the TAVR team. He is only minimally symptomatic at this time so it appears that we have time to plan the best plan for treatment.   Current medicines are reviewed at length with the patient today.  The patient does not have concerns regarding medicines.  The following changes have been made:  no change  Labs/ tests ordered today include:   Orders Placed This Encounter  Procedures  . Ambulatory referral to Cardiothoracic Surgery     Disposition:   FU with me to be arranged after surgical consultation.     Signed, Lauree Chandler, MD 03/19/2015 5:07 PM    [facility] [place], [place],   [address] Phone: [phone]; Fax: [phone]

## 2015-03-19 NOTE — Telephone Encounter (Signed)
Caller name: Samanyu Relation to pt: self Call back number: Pharmacy: [facility]  Reason for call:   Requesting an emergency supply to be sent to local pharmacy. Allopurinol. This is until his mail order arrives.

## 2015-03-19 NOTE — Patient Instructions (Addendum)
We will make referral to surgeons for TAVR.   Follow up with Dr. [name] in 6 months.

## 2015-03-20 MED ORDER — ALLOPURINOL 100 MG PO TABS: ORAL_TABLET | ORAL | Status: DC

## 2015-03-20 NOTE — Telephone Encounter (Signed)
Sent in allopurinol as requested

## 2015-03-28 ENCOUNTER — Ambulatory Visit: Payer: Medicare Other | Admitting: Cardiovascular Disease

## 2015-04-02 ENCOUNTER — Other Ambulatory Visit: Payer: Self-pay | Admitting: Family Medicine

## 2015-04-02 NOTE — Telephone Encounter (Signed)
Please advise 

## 2015-04-03 NOTE — Telephone Encounter (Signed)
Faxed hardcopy for Tramadol to [facility].

## 2015-04-08 ENCOUNTER — Telehealth: Payer: Self-pay | Admitting: Family Medicine

## 2015-04-08 NOTE — Telephone Encounter (Signed)
Relation to pt: self  Call back [phone] [facility]acy: [id number] [place], [place] - [name] (Phone) [phone] (Fax)         Reason for call:  Patient states NEURONTIN cost $240 requesting gabapentin which cost $8.

## 2015-04-08 NOTE — Telephone Encounter (Signed)
Called the patient and he stated the mail order stated  The MD put a code on the prescription and is why the change/charge. The patient will followup with [facility] to followup

## 2015-04-08 NOTE — Telephone Encounter (Signed)
neurontin and gabapentin are the same thing, please clarify with pharmacy, I did not write as DAW so they can substitute one for the other the way it is written. Please keep same sig, same number with refills.

## 2015-04-09 ENCOUNTER — Telehealth: Payer: Self-pay | Admitting: Family Medicine

## 2015-04-09 NOTE — Telephone Encounter (Signed)
Caller name: [place] H. from [facility] Relation to pt: Call back number: [phone] or [phone] Pharmacy:  Reason for call:   Patient takes gabapentin. Is wanting to know if prescription was written for generic or name brand for this medication?

## 2015-04-09 NOTE — Telephone Encounter (Signed)
Advise as this was already addressed on 04/08/15.

## 2015-04-09 NOTE — Telephone Encounter (Signed)
Generic, I have never asked for DAW brand name for this

## 2015-04-09 NOTE — Telephone Encounter (Signed)
Informed the family of PCP instructions. On this medication.

## 2015-04-11 MED ORDER — GABAPENTIN 300 MG PO CAPS: ORAL_CAPSULE | ORAL | Status: DC

## 2015-04-11 NOTE — Telephone Encounter (Signed)
[place] from [facility] that patient tried to have this med refilled and the pharmacy told them that rx was written as name brand. [place] from [facility] is requesting that we send another generic rx over to [facility].

## 2015-04-11 NOTE — Telephone Encounter (Signed)
Medication resent to [facility].

## 2015-04-11 NOTE — Addendum Note (Signed)
Addended by: Elizabeth Sauer on: 04/11/2015 04:46 PM   Modules accepted: Orders

## 2015-04-14 ENCOUNTER — Telehealth: Payer: Self-pay | Admitting: Family Medicine

## 2015-04-14 NOTE — Telephone Encounter (Signed)
Caller name:Jhonatan Relationship to patient:self  Can be reached:[phone] Pharmacy:[facility]  Reason for call: wants all his future meds to go to [facility]

## 2015-04-14 NOTE — Telephone Encounter (Signed)
Pharmacy changed in patient [employer]- all other pharmacies removed.

## 2015-04-16 ENCOUNTER — Institutional Professional Consult (permissible substitution) (INDEPENDENT_AMBULATORY_CARE_PROVIDER_SITE_OTHER): Payer: Medicare Other | Admitting: Surgery

## 2015-04-16 ENCOUNTER — Encounter: Payer: Self-pay | Admitting: Surgery

## 2015-04-16 VITALS — BP 111/59 | HR 72 | Resp 18 | Ht 74.0 in | Wt 386.0 lb

## 2015-04-16 DIAGNOSIS — I350 Nonrheumatic aortic (valve) stenosis: Secondary | ICD-10-CM

## 2015-04-20 ENCOUNTER — Encounter: Payer: Self-pay | Admitting: Surgery

## 2015-04-20 NOTE — Progress Notes (Signed)
[facility]  MULTIDISCIPLINARY HEART VALVE CLINIC    CARDIOTHORACIC SURGERY CONSULTATION REPORT  Referring Provider is Josue Hector, MD PCP is Penni Homans, MD  Chief Complaint  Patient presents with  . Aortic Stenosis    severe..eval for TAVR...TEE 03/07/15    HPI:  The patient is a 74 year old morbidly obese gentleman with hypertension hyperlipidemia, gout, multiple orthopedic and spine problems and aortic stenosis. He has had multiple surgeries on his right knee due to complications after total knee replacement and subsequently has a right foot drop and no sensation in his right foot. He can walk short distances using a walker with hand braces because he can't grip with his hands due to arthritis. He uses a motorized wheel chair most of the time. He has been followed for years for aortic stenosis and had an echo in 2011 showing severe AS and moderate AI. He was told that he was not a surgical candidate for AVR in 2011 due to obesity and his level of debilitation. He has had stable symptoms since then with some shortness of breath with ambulation. He has had no chest pain or dizziness. He has chronic edema in both legs. He had a repeat echo on 03/07/2015 which showed critical AS with a mean gradient now of 47 mm Hg and a peak of 97 mm Hg. There is severe AI. The EF is 60%.  He lives with his wife who is in good health and very attentive. His ambulation is limited but he and his wife keep active and he has configured his home to work for his disability. They are on their way to [facility] after the appt with me today. He is trying to watch his diet closely and has lost some weight. 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 He has always been heavy and weighed over 300 lbs even when he was working.  Past Medical History  Diagnosis Date  . Valvular heart disease   . Aortic stenosis   . Osteoarthritis   . Anemia   . OSA  (obstructive sleep apnea)   . Bursitis   . Gout   . Arthritis   . Hyperlipidemia   . Morbid obesity   . DDD (degenerative disc disease)   . Mixed sensory-motor polyneuropathy   . Peroneal palsy     significant right foot drop  . Hemorrhoids     hx of bleeding  . Hypertension   . History of shingles   . Measles as a child  . Chicken pox as a child  . Medicare annual wellness visit, subsequent 03/09/2015  . Preventative health care 03/09/2015    Past Surgical History  Procedure Laterality Date  . Total knee arthroplasty      right  . Spine surgery    . Laminotomy  1193    c6-t2  . Hernia repair      umbilical hernia  . Knee surgery Right     multiple knee surgeries due to complication of R TKA  . Colonoscopy N/A 02/07/2013    Procedure: COLONOSCOPY;  Surgeon: Juanita Craver, MD;  Location: [facility] ENDOSCOPY;  Service: Endoscopy;  Laterality: N/A;  . Esophagogastroduodenoscopy N/A 02/07/2013    Procedure: ESOPHAGOGASTRODUODENOSCOPY (EGD);  Surgeon: Juanita Craver, MD;  Location: [facility] ENDOSCOPY;  Service: Endoscopy;  Laterality: N/A;  . Colonoscopy N/A 02/09/2013    Procedure: COLONOSCOPY;  Surgeon: Beryle Beams, MD;  Location: [place];  Service: Endoscopy;  Laterality: N/A;  . Givens capsule study N/A 02/09/2013    Procedure: GIVENS CAPSULE STUDY;  Surgeon: Beryle Beams, MD;  Location: [place];  Service: Endoscopy;  Laterality: N/A;  . Tee without cardioversion N/A 03/07/2015    Procedure: TRANSESOPHAGEAL ECHOCARDIOGRAM (TEE);  Surgeon: Josue Hector, MD;  Location: [facility] ENDOSCOPY;  Service: Cardiovascular;  Laterality: N/A;  ANES TO BRING PROPOFOL PER DOCTOR    Family History  Problem Relation Age of Onset  . Arthritis Sister     "crippling"  . Arthritis Sister     History   Social History  . Marital Status: Married    Spouse Name: N/A  . Number of Children: N/A  . Years of Education: N/A   Occupational History  . retired Administrator    Social History Main Topics    . Smoking status: Former Smoker -- 30 years    Types: Cigarettes, Pipe    Quit date: 09/20/2006  . Smokeless tobacco: Never Used  . Alcohol Use: No  . Drug Use: No  . Sexual Activity: No     Comment: lives with wife, retired, no dietary restrictions   Other Topics Concern  . Not on file   Social History Narrative    Current Outpatient Prescriptions  Medication Sig Dispense Refill  . acetaminophen (TYLENOL) 500 MG tablet Take 500 mg by mouth every 6 (six) hours as needed.    [name] allopurinol (ZYLOPRIM) 100 MG tablet TAKE 1 BY MOUTH DAILY 30 tablet 0  . atorvastatin (LIPITOR) 40 MG tablet TAKE 1 BY MOUTH DAILY 90 tablet 2  . cholecalciferol (VITAMIN D) 1000 UNITS tablet Take 2,000 Units by mouth daily.     . Coenzyme Q10 (COQ10) 200 MG CAPS Take 200 mg by mouth daily.    . furosemide (LASIX) 20 MG tablet Take 1 tablet (20 mg total) by mouth daily. 30 tablet 11  . gabapentin (NEURONTIN) 300 MG capsule TAKE 1 BY MOUTH 3 TIMES DAILY 270 capsule 1  . lisinopril (PRINIVIL,ZESTRIL) 10 MG tablet TAKE 1 BY MOUTH DAILY 90 tablet 2  . loratadine (CLARITIN) 10 MG tablet Take 1 tablet (10 mg total) by mouth daily. 30 tablet 0  . Multiple Vitamin (MULTIVITAMIN WITH MINERALS) TABS Take 1 tablet by mouth daily.    . sennosides-docusate sodium (SENOKOT-S) 8.6-50 MG tablet Take 1 tablet by mouth 2 (two) times daily.    . traMADol (ULTRAM) 50 MG tablet TAKE 1 TABLET BY MOUTH EVERY 6 HOURS AS NEEDED 90 tablet 0   No current facility-administered medications for this visit.    No Known Allergies    Review of Systems:   General:  normal appetite, normal energy, no weight gain, few lb weight loss, no fever  Cardiac:  no chest pain with exertion, no chest pain at rest, mild SOB with  exertion, no resting SOB, no PND, no orthopnea, no palpitations, no arrhythmia, no atrial fibrillation, chronic LE edema, no dizzy spells, no syncope  Respiratory:  has shortness of breath, no home oxygen, no productive  cough, no dry cough, no bronchitis, no wheezing, no hemoptysis, no asthma, no pain with inspiration or cough, has sleep apnea, uses CPAP at night  GI:   no difficulty swallowing, no reflux, no frequent heartburn, no hiatal hernia, no abdominal pain, no constipation, no diarrhea, no hematochezia, no hematemesis, no melena  GU:   no dysuria,  no frequency, no urinary tract infection, no hematuria, no enlarged prostate, no kidney stones, no  kidney disease  Vascular:  no pain suggestive of claudication, has pain in feet, has leg cramps, has varicose veins, no DVT, no non-healing foot ulcer  Neuro:   no stroke, no TIA's, no seizures, no headaches, notemporary blindness one eye,  no slurred speech, has peripheral neuropathy,has chronic pain, has instability of gait, no memory/cognitive dysfunction  Musculoskeletal: has arthritis, has joint swelling, no myalgias, has difficulty walking, decreased mobility   Skin:   no rash, no itching, no skin infections, no pressure sores or ulcerations  Psych:   no anxiety, no depression, no nervousness, no unusual recent stress  Eyes:   no blurry vision, has floaters, no recent vision changes, does wears glasses   ENT:   no hearing loss, no loose or painful teeth, no dentures, last saw dentist in June 2016  Hematologic:  no easy bruising, no abnormal bleeding, no clotting disorder, no frequent epistaxis  Endocrine:  no diabetes, does not check CBG's at home           Physical Exam:   BP 111/59 mmHg  Pulse 72  Resp 18  Ht 6\' 2"  (1.88 m)  Wt 386 lb (175.088 kg)  BMI 49.54 kg/m2  SpO2 93%  General:  Morbidly obese but  well-appearing white male in no distress  HEENT:  Unremarkable , NCAT, PERLA, EOMI, Oropharynx clear  Neck:   no JVD, no bruits, no adenopathy or thyromegaly  Chest:   clear to auscultation, symmetrical breath sounds, no wheezes, no rhonchi   CV:   RRR, grade III/VI crescendo/decrescendo murmur heard best at RSB,  II/VI diastolic murmur at  apex  Abdomen:  soft, non-tender, no masses , large panniculus  Extremities:  warm, well-perfused, pulses not palpable, marked chronic LE brawny edema with skin discoloration and thickening.  Rectal/GU  Deferred  Neuro:   Grossly non-focal and symmetrical throughout  Skin:   Clean and dry, no rashes, no breakdown   Diagnostic Tests:          *[place]*         *[facility]*            [address], [place] [address]              [phone]  ------------------------------------------------------------------- Transesophageal Echocardiography  Patient:  [name], Olm MR #:    [id number] Study Date: 03/07/2015 Gender:   M Age:    4 Height:   188 cm Weight:   172.4 kg BSA:    3.08 m^2 Pt. Status: Room:  ADMITTING  Jenkins Rouge, M.D. ATTENDING  Jenkins Rouge, M.D. ORDERING   Jenkins Rouge, M.D. PERFORMING  Jenkins Rouge, M.D. REFERRING  Jenkins Rouge, M.D. SONOGRAPHER Diamond Nickel  cc:  -------------------------------------------------------------------  ------------------------------------------------------------------- Indications:   Aortic stenosis 424.1.  ------------------------------------------------------------------- Study Conclusions  - Impressions: Normal EF 60% Mild MR Normal RV No LAA thrombus No aortic debris NO ASD  Aortic valve trileaflet and severely calcified with restricted motion Critical AS with mean gradient close to 50 mmHg and peak near 100 mmHg even with suboptimal transgastric signals. Also severe AR  Patient weighs 380 lbs with limited functional ability Will have Dr Roxy Manns and Burt Knack review regarding AVR vs TAVR although degree of AR may prohibit latter  Case done with ketamine drip and 40 mg propofol and anesthesia support of airway  Jenkins Rouge MD  [facility].  Impressions:  - Normal EF 60% Mild MR  Normal RV No LAA thrombus No aortic debris NO ASD  Aortic valve trileaflet and severely calcified with restricted motion Critical AS with mean gradient close to 50 mmHg and peak near 100 mmHg even with suboptimal transgastric signals. Also severe AR  Patient weighs 380 lbs with limited functional ability Will have Dr Roxy Manns and Burt Knack review regarding AVR vs TAVR although degree of AR may prohibit latter  Case done with ketamine drip and 40 mg propofol and anesthesia support of airway  Jenkins Rouge MD [facility].  Diagnostic transesophageal echocardiography. 2D and color Doppler. Birthdate: Patient birthdate: 11-11-1940. Age: Patient is 74 yr old. Sex: Gender: male.  BMI: 48.8 kg/m^2. Blood pressure: 120/61 Patient status: Outpatient. Study date: Study date: 03/07/2015. Study time: 12:16 PM. Location: Endoscopy.  -------------------------------------------------------------------  ------------------------------------------------------------------- Aortic valve:  Doppler:   Mean gradient (S): 47 mm Hg. Peak gradient (S): 97 mm Hg.  ------------------------------------------------------------------- Measurements  Aortic valve            Value Aortic valve peak velocity, S   492  cm/s Aortic valve mean velocity, S   303  cm/s Aortic valve VTI, S        104  cm Aortic mean gradient, S      47  mm Hg Aortic peak gradient, S      97  mm Hg  Legend: (L) and (H) mark values outside specified reference range.  ------------------------------------------------------------------- Prepared and Electronically Authenticated by  Jenkins Rouge, M.D. 2016-06-17T10:21:02   Impression:  He has severe aortic stenosis and insufficiency with fairly mild symptoms at this point although he is in a wheel chair a large part of the time and  remains active but no exercising or walking except in the house. I have personally reviewed his TEE and he has a trileaflet aortic valve with severely calcified leaflets that are poorly mobile. There is severe AS and AI with good LV function. I think AVR is indicated to prevent the inevitable progression of symptoms and LV dysfunction. He is not very ambulatory but still quite active with a good quality of life by his report and he would like to remain so. I don't think he is a candidate for open surgical AVR but I think TAVR is a reasonable alternative and I think he could make a good recovery from that.    I discussed the relative risks and benefits of all options for the treatment of severe aortic stenosis including long term medical therapy, conventional surgery for aortic valve replacement, and transcatheter aortic valve replacement.  I discussed the complications that might develop including but not limited to risks of death, stroke, paravalvular leak, aortic dissection or other major vascular complications, aortic annulus rupture, device embolization, cardiac rupture or perforation, mitral regurgitation, acute myocardial infarction, arrhythmia, heart block or bradycardia requiring permanent pacemaker placement, congestive heart failure, respiratory failure, renal failure, pneumonia, infection, other late complications related to structural valve deterioration or migration, or other complications that might ultimately cause a temporary or permanent loss of functional independence or other long term morbidity.  He would like to proceed with workup.    Plan:  He will have a cardiac cath performed by Dr. Angelena Form and then we will try to get a cardiac CT and CTA of the chest, abdomen and pelvis. Then we will see him back to discuss the results.    Gaye Pollack, MD 04/16/2015

## 2015-04-21 ENCOUNTER — Other Ambulatory Visit: Payer: Self-pay | Admitting: *Deleted

## 2015-04-21 ENCOUNTER — Telehealth: Payer: Self-pay | Admitting: *Deleted

## 2015-04-21 ENCOUNTER — Encounter: Payer: Self-pay | Admitting: *Deleted

## 2015-04-21 DIAGNOSIS — I350 Nonrheumatic aortic (valve) stenosis: Secondary | ICD-10-CM

## 2015-04-21 NOTE — Addendum Note (Signed)
Addended by: Lauree Chandler D on: 04/21/2015 04:29 PM   Modules accepted: Orders

## 2015-04-21 NOTE — Telephone Encounter (Signed)
I spoke with pt and cath arranged for May 02, 2015 at 9 AM.  I verbally went over all instructions with pt and will mail a copy of instructions to him.  He will come in for lab work on April 30, 2015

## 2015-04-21 NOTE — Telephone Encounter (Signed)
Dr. Angelena Form has spoken with pt and would like to set him up for right and left heart cath on May 02, 2015 at 9 AM. Will need lab work that week.

## 2015-04-29 ENCOUNTER — Telehealth: Payer: Self-pay | Admitting: Family Medicine

## 2015-04-29 MED ORDER — ALLOPURINOL 100 MG PO TABS: ORAL_TABLET | ORAL | Status: DC

## 2015-04-29 NOTE — Telephone Encounter (Signed)
Relation to PO:[id number]  Call back number:[phone] Pharmacy: [place], [place] - [name] [phone] (Phone) [phone] (Fax)         Reason for call:  Patient requesting a refill allopurinol (ZYLOPRIM) 100 MG tablet

## 2015-04-30 ENCOUNTER — Other Ambulatory Visit (INDEPENDENT_AMBULATORY_CARE_PROVIDER_SITE_OTHER): Payer: Medicare Other | Admitting: *Deleted

## 2015-04-30 DIAGNOSIS — I350 Nonrheumatic aortic (valve) stenosis: Secondary | ICD-10-CM

## 2015-04-30 LAB — CBC WITH DIFFERENTIAL/PLATELET
Basophils Absolute: 0 10*3/uL (ref 0.0–0.1)
Basophils Relative: 0.3 % (ref 0.0–3.0)
Eosinophils Absolute: 0.1 10*3/uL (ref 0.0–0.7)
Eosinophils Relative: 1.3 % (ref 0.0–5.0)
HCT: 35.1 % — ABNORMAL LOW (ref 39.0–52.0)
Hemoglobin: 11.3 g/dL — ABNORMAL LOW (ref 13.0–17.0)
Lymphocytes Relative: 20.2 % (ref 12.0–46.0)
Lymphs Abs: 1.5 10*3/uL (ref 0.7–4.0)
MCHC: 32.2 g/dL (ref 30.0–36.0)
MCV: 90.1 fl (ref 78.0–100.0)
Monocytes Absolute: 0.4 10*3/uL (ref 0.1–1.0)
Monocytes Relative: 5.8 % (ref 3.0–12.0)
Neutro Abs: 5.4 10*3/uL (ref 1.4–7.7)
Neutrophils Relative %: 72.4 % (ref 43.0–77.0)
Platelets: 218 10*3/uL (ref 150.0–400.0)
RBC: 3.9 Mil/uL — ABNORMAL LOW (ref 4.22–5.81)
RDW: 18.4 % — ABNORMAL HIGH (ref 11.5–15.5)
WBC: 7.4 10*3/uL (ref 4.0–10.5)

## 2015-04-30 LAB — BASIC METABOLIC PANEL
BUN: 18 mg/dL (ref 6–23)
CO2: 29 mEq/L (ref 19–32)
Calcium: 9.3 mg/dL (ref 8.4–10.5)
Chloride: 104 mEq/L (ref 96–112)
Creatinine, Ser: 1.01 mg/dL (ref 0.40–1.50)
GFR: 77.02 mL/min (ref >60.00)
Glucose, Bld: 133 mg/dL — ABNORMAL HIGH (ref 70–99)
Potassium: 4.9 mEq/L (ref 3.5–5.1)
Sodium: 140 mEq/L (ref 135–145)

## 2015-04-30 LAB — PROTIME-INR
INR: 1.1 ratio — ABNORMAL HIGH (ref 0.8–1.0)
Prothrombin Time: 11.7 s (ref 9.6–13.1)

## 2015-05-02 ENCOUNTER — Encounter (HOSPITAL_COMMUNITY): Payer: Self-pay | Admitting: Cardiovascular Disease

## 2015-05-02 ENCOUNTER — Ambulatory Visit (HOSPITAL_COMMUNITY)
Admission: RE | Admit: 2015-05-02 | Discharge: 2015-05-02 | Disposition: A | Discharge status: Home / Self Care | Payer: Medicare Other | Source: Ambulatory Visit | Attending: Cardiovascular Disease | Admitting: Cardiovascular Disease

## 2015-05-02 ENCOUNTER — Encounter (HOSPITAL_COMMUNITY)
Admission: RE | Disposition: A | Discharge status: Home / Self Care | Payer: Medicare Other | Source: Ambulatory Visit | Attending: Cardiovascular Disease

## 2015-05-02 DIAGNOSIS — E785 Hyperlipidemia, unspecified: Secondary | ICD-10-CM | POA: Diagnosis not present

## 2015-05-02 DIAGNOSIS — I350 Nonrheumatic aortic (valve) stenosis: Secondary | ICD-10-CM | POA: Insufficient documentation

## 2015-05-02 DIAGNOSIS — Z87891 Personal history of nicotine dependence: Secondary | ICD-10-CM | POA: Insufficient documentation

## 2015-05-02 DIAGNOSIS — I10 Essential (primary) hypertension: Secondary | ICD-10-CM | POA: Insufficient documentation

## 2015-05-02 DIAGNOSIS — Z96651 Presence of right artificial knee joint: Secondary | ICD-10-CM | POA: Insufficient documentation

## 2015-05-02 HISTORY — PX: CARDIAC CATHETERIZATION: SHX172

## 2015-05-02 LAB — POCT I-STAT 3, VENOUS BLOOD GAS (G3P V)
Acid-Base Excess: 3 mmol/L — ABNORMAL HIGH (ref 0.0–2.0)
Bicarbonate: 30.2 mEq/L — ABNORMAL HIGH (ref 20.0–24.0)
O2 Saturation: 60 %
TCO2: 32 mmol/L (ref 0–100)
pCO2, Ven: 55.9 mmHg — ABNORMAL HIGH (ref 45.0–50.0)
pH, Ven: 7.341 — ABNORMAL HIGH (ref 7.250–7.300)
pO2, Ven: 34 mmHg (ref 30.0–45.0)

## 2015-05-02 LAB — POCT I-STAT 3, ART BLOOD GAS (G3+)
Acid-Base Excess: 1 mmol/L (ref 0.0–2.0)
Bicarbonate: 27.1 mEq/L — ABNORMAL HIGH (ref 20.0–24.0)
O2 Saturation: 93 %
TCO2: 29 mmol/L (ref 0–100)
pCO2 arterial: 46.8 mmHg — ABNORMAL HIGH (ref 35.0–45.0)
pH, Arterial: 7.371 (ref 7.350–7.450)
pO2, Arterial: 71 mmHg — ABNORMAL LOW (ref 80.0–100.0)

## 2015-05-02 SURGERY — RIGHT/LEFT HEART CATH AND CORONARY ANGIOGRAPHY
Anesthesia: LOCAL

## 2015-05-02 MED ORDER — HEPARIN (PORCINE) IN NACL 2-0.9 UNIT/ML-% IJ SOLN
INTRAMUSCULAR | Status: AC
  Filled 2015-05-02: qty 1000

## 2015-05-02 MED ORDER — LIDOCAINE HCL (PF) 1 % IJ SOLN
INTRAMUSCULAR | Status: AC
  Filled 2015-05-02: qty 30

## 2015-05-02 MED ORDER — ASPIRIN 81 MG PO CHEW
81.0000 mg | CHEWABLE_TABLET | ORAL | Status: AC
  Administered 2015-05-02: 81 mg via ORAL

## 2015-05-02 MED ORDER — SODIUM CHLORIDE 0.9 % IJ SOLN: 3.0000 mL | Freq: Two times a day (BID) | INTRAMUSCULAR | Status: DC

## 2015-05-02 MED ORDER — MIDAZOLAM HCL 2 MG/2ML IJ SOLN
INTRAMUSCULAR | Status: DC | PRN
  Administered 2015-05-02: 1 mg via INTRAVENOUS

## 2015-05-02 MED ORDER — SODIUM CHLORIDE 0.9 % IJ SOLN: 3.0000 mL | INTRAMUSCULAR | Status: DC | PRN

## 2015-05-02 MED ORDER — NITROGLYCERIN 1 MG/10 ML FOR IR/CATH LAB
INTRA_ARTERIAL | Status: AC
  Filled 2015-05-02: qty 10

## 2015-05-02 MED ORDER — SODIUM CHLORIDE 0.9 % IV SOLN
INTRAVENOUS | Status: AC
Start: 2015-05-02 — End: 2015-05-02
  Administered 2015-05-02: 08:00:00 via INTRAVENOUS

## 2015-05-02 MED ORDER — SODIUM CHLORIDE 0.9 % IV SOLN: 250.0000 mL | INTRAVENOUS | Status: DC | PRN

## 2015-05-02 MED ORDER — VERAPAMIL HCL 2.5 MG/ML IV SOLN
INTRAVENOUS | Status: DC | PRN
  Administered 2015-05-02: 10:00:00 via INTRA_ARTERIAL

## 2015-05-02 MED ORDER — LIDOCAINE HCL (PF) 1 % IJ SOLN
INTRAMUSCULAR | Status: DC | PRN
  Administered 2015-05-02: 10 mL via INTRADERMAL

## 2015-05-02 MED ORDER — HEPARIN SODIUM (PORCINE) 1000 UNIT/ML IJ SOLN
INTRAMUSCULAR | Status: DC | PRN
  Administered 2015-05-02: 5000 [IU] via INTRAVENOUS

## 2015-05-02 MED ORDER — VERAPAMIL HCL 2.5 MG/ML IV SOLN
INTRAVENOUS | Status: AC
  Filled 2015-05-02: qty 2

## 2015-05-02 MED ORDER — FENTANYL CITRATE (PF) 100 MCG/2ML IJ SOLN
INTRAMUSCULAR | Status: AC
  Filled 2015-05-02: qty 4

## 2015-05-02 MED ORDER — SODIUM CHLORIDE 0.9 % IJ SOLN
3.0000 mL | Freq: Two times a day (BID) | INTRAMUSCULAR | Status: DC
Start: 2015-05-02 — End: 2015-05-02

## 2015-05-02 MED ORDER — FENTANYL CITRATE (PF) 100 MCG/2ML IJ SOLN
INTRAMUSCULAR | Status: DC | PRN
  Administered 2015-05-02: 25 ug via INTRAVENOUS

## 2015-05-02 MED ORDER — IOHEXOL 350 MG/ML SOLN
INTRAVENOUS | Status: DC | PRN
  Administered 2015-05-02: 70 mL via INTRA_ARTERIAL

## 2015-05-02 MED ORDER — MIDAZOLAM HCL 2 MG/2ML IJ SOLN
INTRAMUSCULAR | Status: AC
  Filled 2015-05-02: qty 4

## 2015-05-02 MED ORDER — SODIUM CHLORIDE 0.9 % IV SOLN: INTRAVENOUS | Status: AC

## 2015-05-02 MED ORDER — ASPIRIN 81 MG PO CHEW
CHEWABLE_TABLET | ORAL | Status: AC
  Filled 2015-05-02: qty 1

## 2015-05-02 MED ORDER — HEPARIN SODIUM (PORCINE) 1000 UNIT/ML IJ SOLN
INTRAMUSCULAR | Status: AC
  Filled 2015-05-02: qty 1

## 2015-05-02 SURGICAL SUPPLY — 16 items
CATH BALLN WEDGE 5F 110CM (CATHETERS) ×1 IMPLANT
CATH INFINITI 5 FR JL3.5 (CATHETERS) ×2 IMPLANT
CATH INFINITI 5FR AL1 (CATHETERS) ×1 IMPLANT
CATH INFINITI 5FR JL4 (CATHETERS) ×1 IMPLANT
CATH INFINITI JR4 5F (CATHETERS) ×2 IMPLANT
DEVICE RAD COMP TR BAND LRG (VASCULAR PRODUCTS) ×2 IMPLANT
GLIDESHEATH SLEND SS 6F .021 (SHEATH) ×2 IMPLANT
GUIDEWIRE .025 260CM (WIRE) ×1 IMPLANT
KIT HEART LEFT (KITS) ×2 IMPLANT
KIT HEART RIGHT NAMIC (KITS) ×2 IMPLANT
PACK CARDIAC CATHETERIZATION (CUSTOM PROCEDURE TRAY) ×2 IMPLANT
SHEATH FAST CATH BRACH 5F 5CM (SHEATH) ×1 IMPLANT
TRANSDUCER W/STOPCOCK (MISCELLANEOUS) ×3 IMPLANT
TUBING CIL FLEX 10 FLL-RA (TUBING) ×2 IMPLANT
WIRE EMERALD ST .035X260CM (WIRE) ×1 IMPLANT
WIRE SAFE-T 1.5MM-J .035X260CM (WIRE) ×2 IMPLANT

## 2015-05-02 NOTE — H&P (Signed)
History of Present Illness: 74 yo male with history of morbid obesity, mild HTN, HLD, gout and aortic stenosis who is referred today by Dr. [name]. He has a history of prior GI bleeding with large volume blood loss in 2014. He was found to have cecal AVMs treated with ablation. He has had no bleeding since then. He is confined to a wheelchair most of the time and has episodes of gout very often. He does walk short distances around the house. He has foot drop following his knee surgery in 2008. He has had many issues with his right knee following his surgery. He has a prior cervical spine surgery with diagnosis of spinal cord tumor. He believes that he has neurofibromatosis and his sons have multiple tumors consistent with this as well. He has been followed for aortic valve stenosis for many years. Echo in 2011 with severe AS and moderate AI. He has had no change in his symptoms over the last few years. He has baseline SOB but no real change. No chest pain. He weight is stable at 380 lbs. He was told in 2011 that he was not a candidate for AVR given his obesity. He was recently seen by Dr. [name] and given his severe AS, he would like to explore the option of TAVR. TEE 03/07/15 with normal LV function, severe AS with mean gradient of 50 mmHg and peak gradient of 100 mmHg. There was also severe AI. He has seen Dr. Cyndia Bent and we are moving forward with TAVR workup. Plans for right and left heart cath today.    Pt has no complaints today. No chest pain, SOB, palpitations.    Past Medical History  Diagnosis Date  . Valvular heart disease   . Aortic stenosis   . Osteoarthritis   . Anemia   . OSA (obstructive sleep apnea)   . Bursitis   . Gout   . Arthritis   . Hyperlipidemia   . Morbid obesity   . DDD (degenerative disc disease)   . Mixed sensory-motor polyneuropathy   . Peroneal palsy     significant right foot drop  . Hemorrhoids     hx of  bleeding  . Hypertension   . History of shingles   . Measles as a child  . Chicken pox as a child  . Medicare annual wellness visit, subsequent 03/09/2015  . Preventative health care 03/09/2015    Past Surgical History  Procedure Laterality Date  . Total knee arthroplasty      right  . Spine surgery    . Laminotomy  1193    c6-t2  . Hernia repair      umbilical hernia  . Knee surgery Right     multiple knee surgeries due to complication of R TKA  . Colonoscopy N/A 02/07/2013    Procedure: COLONOSCOPY; Surgeon: Juanita Craver, MD; Location: [facility] ENDOSCOPY; Service: Endoscopy; Laterality: N/A;  . Esophagogastroduodenoscopy N/A 02/07/2013    Procedure: ESOPHAGOGASTRODUODENOSCOPY (EGD); Surgeon: Juanita Craver, MD; Location: [facility] ENDOSCOPY; Service: Endoscopy; Laterality: N/A;  . Colonoscopy N/A 02/09/2013    Procedure: COLONOSCOPY; Surgeon: Beryle Beams, MD; Location: [place]; Service: Endoscopy; Laterality: N/A;  . Givens capsule study N/A 02/09/2013    Procedure: GIVENS CAPSULE STUDY; Surgeon: Beryle Beams, MD; Location: [place]; Service: Endoscopy; Laterality: N/A;  . Tee without cardioversion N/A 03/07/2015    Procedure: TRANSESOPHAGEAL ECHOCARDIOGRAM (TEE); Surgeon: Josue Hector, MD; Location: [place]; Service: Cardiovascular; Laterality: N/A; ANES TO BRING PROPOFOL PER DOCTOR  Current Outpatient Prescriptions  Medication Sig Dispense Refill  . acetaminophen (TYLENOL) 500 MG tablet Take 500 mg by mouth every 6 (six) hours as needed.    [name] allopurinol (ZYLOPRIM) 100 MG tablet TAKE 1 BY MOUTH DAILY 90 tablet 0  . atorvastatin (LIPITOR) 40 MG tablet TAKE 1 BY MOUTH DAILY 90 tablet 2  . cholecalciferol (VITAMIN D) 1000 UNITS tablet Take 2,000 Units by mouth daily.     . Coenzyme Q10 (COQ10) 200 MG CAPS Take 200 mg by mouth daily.     . furosemide (LASIX) 20 MG tablet Take 1 tablet (20 mg total) by mouth daily. 30 tablet 11  . gabapentin (NEURONTIN) 300 MG capsule TAKE 1 BY MOUTH 3 TIMES DAILY 270 capsule 1  . lisinopril (PRINIVIL,ZESTRIL) 10 MG tablet TAKE 1 BY MOUTH DAILY 90 tablet 2  . Multiple Vitamin (MULTIVITAMIN WITH MINERALS) TABS Take 1 tablet by mouth daily.    . sennosides-docusate sodium (SENOKOT-S) 8.6-50 MG tablet Take 1 tablet by mouth 2 (two) times daily.    . traMADol (ULTRAM) 50 MG tablet Take 1 tablet (50 mg total) by mouth every 6 (six) hours as needed. 90 tablet 0  . loratadine (CLARITIN) 10 MG tablet Take 1 tablet (10 mg total) by mouth daily. (Patient not taking: Reported on 03/19/2015) 30 tablet 0   No current facility-administered medications for this visit.    No Known Allergies  History   Social History  . Marital Status: Married    Spouse Name: N/A  . Number of Children: N/A  . Years of Education: N/A   Occupational History  . retired Administrator    Social History Main Topics  . Smoking status: Former Smoker -- 30 years    Types: Cigarettes, Pipe    Quit date: 09/20/2006  . Smokeless tobacco: Never Used  . Alcohol Use: No  . Drug Use: No  . Sexual Activity: No     Comment: lives with wife, retired, no dietary restrictions   Other Topics Concern  . Not on file   Social History Narrative    Family History  Problem Relation Age of Onset  . Arthritis Sister     "crippling"  . Arthritis Sister     Review of Systems: As stated in the HPI and otherwise negative.   Physical Examination: General: Morbidly obese WM in NAD  HEENT: OP clear, mucus membranes moist  SKIN: warm, dry. No rashes. Neuro: No focal deficits  Musculoskeletal: Muscle strength 5/5 all ext  Psychiatric: Mood and affect normal  Neck: unable to assess for JVD, no carotid bruits.   Lungs:Clear bilaterally, no wheezes, rhonci, crackles Cardiovascular: Regular rate and rhythm. Loud harsh systolic murmur. No gallops or rubs. Abdomen:Soft. Bowel sounds present. Non-tender.  Extremities: 1-2 + bilateral lower extremity edema with chronic venous stasis changes. Pulses are 2 + in the bilateral DP/PT.  TEE March 07, 2015: Impressions: Normal EF 60% Mild MR Normal RV No LAA thrombus No aortic debris NO ASD  Aortic valve trileaflet and severely calcified with restricted motion Critical AS with mean gradient close to 50 mmHg and peak near 100 mmHg even with suboptimal transgastric signals. Also severe AR  Lipid Panel  Labs (Brief)       Component Value Date/Time   CHOL 118 02/21/2015 0921   TRIG 84.0 02/21/2015 0921   HDL 29.30* 02/21/2015 0921   CHOLHDL 4 02/21/2015 0921   VLDL 16.8 02/21/2015 0921   LDLCALC 72 02/21/2015 0921     .  BMET    Component Value Date/Time   NA 140 04/30/2015 0947   K 4.9 04/30/2015 0947   CL 104 04/30/2015 0947   CO2 29 04/30/2015 0947   GLUCOSE 133* 04/30/2015 0947   BUN 18 04/30/2015 0947   CREATININE 1.01 04/30/2015 0947   CREATININE 1.19 02/28/2015 1548   CALCIUM 9.3 04/30/2015 0947   GFRNONAA 87* 02/16/2013 0515   GFRAA >90 02/16/2013 0515   CBC    Component Value Date/Time   WBC 7.4 04/30/2015 0947   RBC 3.90* 04/30/2015 0947   RBC 3.49* 03/26/2013 1430   HGB 11.3* 04/30/2015 0947   HCT 35.1* 04/30/2015 0947   PLT 218.0 04/30/2015 0947   MCV 90.1 04/30/2015 0947   MCH 28.1 04/19/2014 1418   MCHC 32.2 04/30/2015 0947   RDW 18.4* 04/30/2015 0947   LYMPHSABS 1.5 04/30/2015 0947   MONOABS 0.4 04/30/2015 0947   EOSABS 0.1 04/30/2015 0947   BASOSABS 0.0 04/30/2015 0947    Other studies Reviewed: Additional studies/ records that were reviewed today include: TEE images. Review of the above records demonstrates: severe AS, severe AI   Assessment and Plan:    1. Severe aortic valve stenosis: He has severe AS by recent TEE. This has been present for at least the last five years. He is mostly sedentary due to his morbid obesity, knee pain and foot drop with gout. He would not be considered for traditional AVR given his morbid obesity with inability to rehab following surgery. We are pursuing TAVR as an option for AVR. Right and left heart cath today. [name]

## 2015-05-02 NOTE — Progress Notes (Signed)
Unable to access right or left antecub IV for right heart cath; IV Therapy consult ordered

## 2015-05-02 NOTE — Progress Notes (Signed)
Brachial sheath pulled. Cath intact. Pressure held for 10 minutes. Site unremarkable.

## 2015-05-02 NOTE — Discharge Instructions (Signed)
Radial Site Care °Refer to this sheet in the next few weeks. These instructions provide you with information on caring for yourself after your procedure. Your caregiver may also give you more specific instructions. Your treatment has been planned according to current medical practices, but problems sometimes occur. Call your caregiver if you have any problems or questions after your procedure. °HOME CARE INSTRUCTIONS °· You may shower the day after the procedure. Remove the bandage (dressing) and gently wash the site with plain soap and water. Gently pat the site dry. °· Do not apply powder or lotion to the site. °· Do not submerge the affected site in water for 3 to 5 days. °· Inspect the site at least twice daily. °· Do not flex or bend the affected arm for 24 hours. °· No lifting over 5 pounds (2.3 kg) for 5 days after your procedure. °· Do not drive home if you are discharged the same day of the procedure. Have someone else drive you. °· You may drive 24 hours after the procedure unless otherwise instructed by your caregiver. °· Do not operate machinery or power tools for 24 hours. °· A responsible adult should be with you for the first 24 hours after you arrive home. °What to expect: °· Any bruising will usually fade within 1 to 2 weeks. °· Blood that collects in the tissue (hematoma) may be painful to the touch. It should usually decrease in size and tenderness within 1 to 2 weeks. °SEEK IMMEDIATE MEDICAL CARE IF: °· You have unusual pain at the radial site. °· You have redness, warmth, swelling, or pain at the radial site. °· You have drainage (other than a small amount of blood on the dressing). °· You have chills. °· You have a fever or persistent symptoms for more than 72 hours. °· You have a fever and your symptoms suddenly get worse. °· Your arm becomes pale, cool, tingly, or numb. °· You have heavy bleeding from the site. Hold pressure on the site. °Document Released: 10/09/2010 Document Revised:  11/29/2011 Document Reviewed: 10/09/2010 °ExitCare® Patient Information ©2015 [employer]. This information is not intended to replace advice given to you by your health care provider. Make sure you discuss any questions you have with your health care provider. ° °

## 2015-05-02 NOTE — Interval H&P Note (Signed)
History and Physical Interval Note:  05/02/2015 7:40 AM  [name]  has presented today for cardiac cath with the diagnosis of severe aortic stenosis  The various methods of treatment have been discussed with the patient and family. After consideration of risks, benefits and other options for treatment, the patient has consented to  Procedure(s): Right/Left Heart Cath and Coronary Angiography (N/A) as a surgical intervention .  The patient's history has been reviewed, patient examined, no change in status, stable for surgery.  I have reviewed the patient's chart and labs.  Questions were answered to the patient's satisfaction.    No PCI will be performed today.    Devlin Brink

## 2015-05-05 ENCOUNTER — Ambulatory Visit (HOSPITAL_COMMUNITY): Payer: Medicare Other

## 2015-05-05 ENCOUNTER — Ambulatory Visit (HOSPITAL_COMMUNITY)
Admission: RE | Admit: 2015-05-05 | Discharge: 2015-05-05 | Disposition: A | Discharge status: Home / Self Care | Payer: Medicare Other | Source: Ambulatory Visit | Attending: Surgery | Admitting: Surgery

## 2015-05-05 DIAGNOSIS — I350 Nonrheumatic aortic (valve) stenosis: Secondary | ICD-10-CM | POA: Diagnosis not present

## 2015-05-05 LAB — PULMONARY FUNCTION TEST
DL/VA % pred: 92 %
DL/VA: 4.34 ml/min/mmHg/L
DLCO unc % pred: 57 %
DLCO unc: 20.21 ml/min/mmHg
FEF 25-75 Post: 0.82 L/s
FEF 25-75 Pre: 0.95 L/s
FEF2575-%Change-Post: -14 %
FEF2575-%Pred-Post: 32 %
FEF2575-%Pred-Pre: 37 %
FEV1-%Change-Post: 2 %
FEV1-%Pred-Post: 44 %
FEV1-%Pred-Pre: 43 %
FEV1-Post: 1.5 L
FEV1-Pre: 1.47 L
FEV1FVC-%Change-Post: 6 %
FEV1FVC-%Pred-Pre: 88 %
FEV6-%Change-Post: -2 %
FEV6-%Pred-Post: 49 %
FEV6-%Pred-Pre: 50 %
FEV6-Post: 2.16 L
FEV6-Pre: 2.22 L
FEV6FVC-%Change-Post: 1 %
FEV6FVC-%Pred-Post: 105 %
FEV6FVC-%Pred-Pre: 104 %
FVC-%Change-Post: -4 %
FVC-%Pred-Post: 46 %
FVC-%Pred-Pre: 48 %
FVC-Post: 2.17 L
FVC-Pre: 2.26 L
Post FEV1/FVC ratio: 69 %
Post FEV6/FVC ratio: 100 %
Pre FEV1/FVC ratio: 65 %
Pre FEV6/FVC Ratio: 98 %
RV % pred: 86 %
RV: 2.26 L
TLC % pred: 64 %
TLC: 4.81 L

## 2015-05-05 MED ORDER — ALBUTEROL SULFATE (2.5 MG/3ML) 0.083% IN NEBU
2.5000 mg | INHALATION_SOLUTION | Freq: Once | RESPIRATORY_TRACT | Status: AC
  Administered 2015-05-05: 2.5 mg via RESPIRATORY_TRACT

## 2015-05-05 MED FILL — Nitroglycerin IV Soln 100 MCG/ML in D5W: INTRA_ARTERIAL | Qty: 10 | Status: AC

## 2015-05-06 ENCOUNTER — Ambulatory Visit (HOSPITAL_COMMUNITY)
Admission: RE | Admit: 2015-05-06 | Discharge: 2015-05-06 | Disposition: A | Discharge status: Home / Self Care | Payer: Medicare Other | Source: Ambulatory Visit | Attending: Surgery | Admitting: Surgery

## 2015-05-06 DIAGNOSIS — Z01818 Encounter for other preprocedural examination: Secondary | ICD-10-CM | POA: Diagnosis not present

## 2015-05-06 DIAGNOSIS — I350 Nonrheumatic aortic (valve) stenosis: Secondary | ICD-10-CM

## 2015-05-06 DIAGNOSIS — I700 Atherosclerosis of aorta: Secondary | ICD-10-CM | POA: Insufficient documentation

## 2015-05-06 DIAGNOSIS — N261 Atrophy of kidney (terminal): Secondary | ICD-10-CM | POA: Insufficient documentation

## 2015-05-06 DIAGNOSIS — K8020 Calculus of gallbladder without cholecystitis without obstruction: Secondary | ICD-10-CM | POA: Insufficient documentation

## 2015-05-06 DIAGNOSIS — R590 Localized enlarged lymph nodes: Secondary | ICD-10-CM | POA: Insufficient documentation

## 2015-05-06 DIAGNOSIS — I2510 Atherosclerotic heart disease of native coronary artery without angina pectoris: Secondary | ICD-10-CM | POA: Diagnosis not present

## 2015-05-06 DIAGNOSIS — J8410 Pulmonary fibrosis, unspecified: Secondary | ICD-10-CM | POA: Diagnosis not present

## 2015-05-06 DIAGNOSIS — R911 Solitary pulmonary nodule: Secondary | ICD-10-CM | POA: Diagnosis not present

## 2015-05-06 MED ORDER — IOHEXOL 350 MG/ML SOLN: 100.0000 mL | Freq: Once | INTRAVENOUS | Status: DC | PRN

## 2015-05-06 MED ORDER — IOHEXOL 350 MG/ML SOLN
100.0000 mL | Freq: Once | INTRAVENOUS | Status: AC | PRN
  Administered 2015-05-06: 100 mL via INTRAVENOUS

## 2015-05-07 ENCOUNTER — Other Ambulatory Visit: Payer: Self-pay | Admitting: Family Medicine

## 2015-05-07 NOTE — Telephone Encounter (Signed)
Caller name:Cathy Relation to pt: Call back number:[phone] Pharmacy:[facility]  Reason for call: pt states dr. Charlett Blake had increased his dosage  rx allopurinol (ZYLOPRIM) 100 MG tablet [facility] is [facility] because the one they have is just 1 a day, pt states he should be taking 2 daily

## 2015-05-08 MED ORDER — ALLOPURINOL 100 MG PO TABS: 100.0000 mg | ORAL_TABLET | Freq: Two times a day (BID) | ORAL | Status: DC

## 2015-05-08 NOTE — Telephone Encounter (Signed)
Advise on how patient is taking and is it ok to increase to taking 2 daily

## 2015-05-08 NOTE — Telephone Encounter (Signed)
He was recently hospitalized can you please check if he is still in, if he has been discharged then print the D/C summary for me. THX

## 2015-05-08 NOTE — Telephone Encounter (Signed)
PCP stated ok to send in Allopurinol to take 2 per day as instructed at his last OV for wrist pain.

## 2015-05-08 NOTE — Telephone Encounter (Signed)
Patient is at home.  He was in for a heart test on Friday and Monday. has not been admitted for anything, having testing done in preparation for a new Heart Valve. He stated that at his last appt. With PCP he stated his wrist were bothering him more and PCP instructed to start taking 2 allopuriol daily to help.  Which he has done, but the prescription still states to take one daily and he is now out.

## 2015-05-09 ENCOUNTER — Ambulatory Visit: Payer: Medicare Other | Attending: Surgery | Admitting: Physical Therapy

## 2015-05-09 ENCOUNTER — Encounter: Payer: Self-pay | Admitting: Physical Therapy

## 2015-05-09 DIAGNOSIS — M6281 Muscle weakness (generalized): Secondary | ICD-10-CM | POA: Insufficient documentation

## 2015-05-09 DIAGNOSIS — R269 Unspecified abnormalities of gait and mobility: Secondary | ICD-10-CM | POA: Diagnosis present

## 2015-05-09 DIAGNOSIS — R2681 Unsteadiness on feet: Secondary | ICD-10-CM | POA: Diagnosis present

## 2015-05-09 DIAGNOSIS — I350 Nonrheumatic aortic (valve) stenosis: Secondary | ICD-10-CM

## 2015-05-09 NOTE — Therapy (Signed)
[place], [place], [address] Phone: [phone]   Fax:  [phone]  Physical Therapy Evaluation  Patient Details  Name: [name] MRN: [id number] Date of Birth: [birth year] Referring Provider:  Gaye Pollack, MD  Encounter Date: 05/09/2015      PT End of Session - 05/09/15 1016    Visit Number 1   PT Start Time 1025   PT Stop Time 1130   PT Time Calculation (min) 65 min   Equipment Utilized During Treatment Gait belt   Activity Tolerance Patient tolerated treatment well      Past Medical History  Diagnosis Date  . Valvular heart disease   . Aortic stenosis   . Osteoarthritis   . Anemia   . OSA (obstructive sleep apnea)   . Bursitis   . Gout   . Arthritis   . Hyperlipidemia   . Morbid obesity   . DDD (degenerative disc disease)   . Mixed sensory-motor polyneuropathy   . Peroneal palsy     significant right foot drop  . Hemorrhoids     hx of bleeding  . Hypertension   . History of shingles   . Measles as a child  . Chicken pox as a child  . Medicare annual wellness visit, subsequent 03/09/2015  . Preventative health care 03/09/2015    Past Surgical History  Procedure Laterality Date  . Total knee arthroplasty      right  . Spine surgery    . Laminotomy  1193    c6-t2  . Hernia repair      umbilical hernia  . Knee surgery Right     multiple knee surgeries due to complication of R TKA  . Colonoscopy N/A 02/07/2013    Procedure: COLONOSCOPY;  Surgeon: Juanita Craver, MD;  Location: [facility] ENDOSCOPY;  Service: Endoscopy;  Laterality: N/A;  . Esophagogastroduodenoscopy N/A 02/07/2013    Procedure: ESOPHAGOGASTRODUODENOSCOPY (EGD);  Surgeon: Juanita Craver, MD;  Location: [facility] ENDOSCOPY;  Service: Endoscopy;  Laterality: N/A;  . Colonoscopy N/A 02/09/2013    Procedure: COLONOSCOPY;  Surgeon: Beryle Beams, MD;  Location: [place];  Service: Endoscopy;  Laterality: N/A;  . Givens capsule  study N/A 02/09/2013    Procedure: GIVENS CAPSULE STUDY;  Surgeon: Beryle Beams, MD;  Location: [place];  Service: Endoscopy;  Laterality: N/A;  . Tee without cardioversion N/A 03/07/2015    Procedure: TRANSESOPHAGEAL ECHOCARDIOGRAM (TEE);  Surgeon: Josue Hector, MD;  Location: [facility] ENDOSCOPY;  Service: Cardiovascular;  Laterality: N/A;  ANES TO BRING PROPOFOL PER DOCTOR  . Cardiac catheterization N/A 05/02/2015    Procedure: Right/Left Heart Cath and Coronary Angiography;  Surgeon: Burnell Blanks, MD;  Location: [facility];  Service: Cardiovascular;  Laterality: N/A;    There were no vitals filed for this visit.  Visit Diagnosis:  Generalized muscle weakness - Plan: PT PLAN OF CARE CERT/RE-CERT  Unsteadiness - Plan: PT PLAN OF CARE CERT/RE-CERT  Abnormality of gait - Plan: PT PLAN OF CARE CERT/RE-CERT  Severe aortic stenosis - Plan: PT PLAN OF CARE CERT/RE-CERT      Subjective Assessment - 05/09/15 1029    Subjective reports having heart murmur all his life, in 2011 - MD said heart valve was getting bad, valve has continued to progress; reports two weeks ago went to [employer] and noticed increased SOB for a few weeks   Pertinent History back surgery, L TKR 2006, R TKR 2008 with resulting nerve  loss after that, gout bil. hands/wrists   Currently in Pain? No/denies            [facility] PT Assessment - 05/09/15 0001    Assessment   Medical Diagnosis severe aortic stenosis   Onset Date/Surgical Date 04/16/15   Precautions   Precautions Fall   Restrictions   Weight Bearing Restrictions No   Balance Screen   Has the patient fallen in the past 6 months No   Has the patient had a decrease in activity level because of a fear of falling?  Yes   Is the patient reluctant to leave their home because of a fear of falling?  No   Home Environment   Living Environment Private residence   Living Arrangements Spouse/significant other   Available Help at [facility] to enter   Entrance Stairs-Number of Steps 3-4   Entrance Stairs-Rails Right;Left  uses rails and platform walker with supervison/min [place] - 4 wheels;Dietitian with platforms bil.   Prior Function   Level of Independence Needs assistance with gait  supervision   Cognition   Overall Cognitive Status Within Functional Limits for tasks assessed   Posture/Postural Control   Posture/Postural Control Postural limitations   Postural Limitations Rounded Shoulders;Forward head   ROM / Strength   AROM / PROM / Strength AROM;Strength   AROM   Overall AROM Comments limited UE should ROM by 50%   Strength   Overall Strength Comments R hand dominant, grip strength limited due to bil. UE gout, ankle strength <3/5, knee/hip strength 3-4/5   Strength Assessment Site --   Right/Left hand --   Right Hand Grip (lbs) --   Transfers   Transfers Sit to Stand   Sit to Stand 5: Supervision   Sit to Stand Details (indicate cue type and reason) Requires heavy UE support and increased time. Unable to stand from standard height chair. Has lift chair at home and stands with supervision from power w/c in clinic.   Ambulation/Gait   Ambulation/Gait Yes   Ambulation/Gait Assistance 5: Supervision   Assistive device 4-wheeled walker;Bilateral platform walker   Gait Pattern Trunk flexed;Poor foot clearance - left;Poor foot clearance - right          [facility] Pre-Surgical Assessment - 05/09/15 0001    5 Meter Walk Test- trial 1 25 sec  indicates slow gait speed   Comments >60 seconds with 4 wheeled RW and bil platforms for forearms   comment Unable to stand without UE support   Comment Unable to stand without UE support   ADL/IADL Needs Assistance with: Dressing;Bathing;Meal prep;Yard work;Finances   ADL/IADL Fraility Index Moderately frail   Other comment needs assist ot stie shoes, for transfers, some LE dressing   6  Minute Walk- Baseline yes   BP (mmHg) 128/78 mmHg   HR (bpm) 62   02 Sat (%RA) 92 %   Modified Borg Scale for Dyspnea 0- Nothing at all   Perceived Rate of Exertion (Borg) 6-   6 Minute Walk Post Test yes   BP (mmHg) 134/80 mmHg   HR (bpm) 94   02 Sat (%RA) 108 %   Modified Borg Scale for Dyspnea 2- Mild shortness of breath   Perceived Rate of Exertion (Borg) 13- Somewhat hard   Aerobic Endurance Distance Walked 280   Endurance additional comments Pt able to ambulate full 6 minutes without rest  break. Pt utilizes 4 wheeled RW with bil. UE platforms and demonstrates heavy forward lean onto RW. Pt demo'd 2 instances of foot catching the ground. Pr required supervision for safety and wc follow.                         PT Education - 06/01/2015 1326    Education provided Yes   Education Details benefits of PT for balance/strength post surgery   Person(s) Educated Patient;Spouse   Methods Explanation   Comprehension Verbalized understanding                    Plan - June 01, 2015 1318    Clinical Impression Statement Pt is a 74 yo male with a complex medical history who presents for OP PT eval prior to possible TAVR surgery due to severe aortic stenosis. Prior level of function involves community mobility with power wheelchair and household mobility with 4 wheeled RW with bil. platforms with supervision. Pt has foot drop bil. R>L and significant medical history includes L TKR, R TKR with 3 addition R knee surgeries/revisions, back surgery to remove tumor. In addition, pt reports gout affecting bil UE hand use. Pt/wife report a recent increase of SOB with activity. Pt's strength is fair except his shoulder and ankles which have poor strength due to past injury/surgery. Balance is poor and pt requires use of RW/UE support to safely stand. Aerobic endurance is fair - pt was able to ambulate 6 minutes continuously. Gait speed is not functional for community.    PT Frequency  One time visit   Consulted and Agree with Plan of Care Patient;Family member/caregiver          G-Codes - 01-Jun-2015 1330    Functional Assessment Tool Used 6 minute walk 280'   Functional Limitation Mobility: Walking and moving around   Mobility: Walking and Moving Around Current Status [phone]) At least 60 percent but less than 80 percent impaired, limited or restricted   Mobility: Walking and Moving Around Goal Status [phone]) At least 60 percent but less than 80 percent impaired, limited or restricted   Mobility: Walking and Moving Around Discharge Status [phone]) At least 60 percent but less than 80 percent impaired, limited or restricted       Problem List Patient Active Problem List   Diagnosis Date Noted  . Severe aortic valve stenosis   . Medicare annual wellness visit, subsequent 03/09/2015  . Preventative health care 03/09/2015  . Pleural effusion 12/06/2014  . Allergic rhinitis 12/02/2014  . Wheezing 12/02/2014  . Blister of foot without infection 10/27/2013  . Chronic venous insufficiency 10/02/2013  . Venous stasis dermatitis 10/02/2013  . AVM (arteriovenous malformation) of colon with hemorrhage 02/10/2013  . Thrombocytopenia, unspecified 02/09/2013  . Acute lower gastrointestinal bleeding 02/05/2013    Class: Acute  . Acute blood loss anemia 02/05/2013    Class: Acute  . EDEMA 04/15/2010  . HYPERCHOLESTEROLEMIA 04/14/2010  . Gout 04/14/2010  . Anemia 04/14/2010  . Aortic valve disorder 04/14/2010  . VALVULAR HEART DISEASE 04/14/2010  . OSTEOARTHRITIS 04/14/2010  . ARTHRITIS 04/14/2010  . Multilevel degenerative disc disease 04/14/2010  . BURSITIS 04/14/2010  . OBESITY, MORBID 03/21/2010  . Obstructive sleep apnea 02/19/2008    Latrail Pounders, PT 2015/06/01, 1:32 PM  [facility] [place] [address] [place], [place], [address] Phone: [phone]   Fax:  [phone]

## 2015-05-14 ENCOUNTER — Ambulatory Visit (INDEPENDENT_AMBULATORY_CARE_PROVIDER_SITE_OTHER): Payer: Medicare Other | Admitting: Surgery

## 2015-05-14 DIAGNOSIS — I350 Nonrheumatic aortic (valve) stenosis: Secondary | ICD-10-CM

## 2015-05-15 ENCOUNTER — Other Ambulatory Visit: Payer: Self-pay | Admitting: *Deleted

## 2015-05-15 DIAGNOSIS — I350 Nonrheumatic aortic (valve) stenosis: Secondary | ICD-10-CM

## 2015-05-16 ENCOUNTER — Other Ambulatory Visit: Payer: Self-pay | Admitting: *Deleted

## 2015-05-16 DIAGNOSIS — I350 Nonrheumatic aortic (valve) stenosis: Secondary | ICD-10-CM

## 2015-05-18 ENCOUNTER — Encounter: Payer: Self-pay | Admitting: Surgery

## 2015-05-18 NOTE — Progress Notes (Signed)
HPI:  He returns today to discuss the results of his cardiac cath and CT scans performed in workup for TAVR. Cath shows no coronary disease. His mean AV gradient was 70 and the peak gradient was 107. Unfortunately he could not fit into the scanner for a gated cardiac CT due to his body size and arthritis in his shoulders preventing him from getting his arms above his head. He did have a CTA of the chest which showed a large amount of calcification of the intervalvular fibrosa and the mitral annulus that may interfere with sizing of the annulus by TEE in the OR and may increase the risk of paravalvular leak. Dr. Meda Coffee did review his previous TEE and feels that his annulus is not too large for a 29 mm valve so TAVR seems to be an appropriate option. His pelvic arterial access appears adequate for transfemoral approach. He is symptomatically unchanged with exertional dyspnea and fatigue.   Current Outpatient Prescriptions  Medication Sig Dispense Refill  . acetaminophen (TYLENOL) 500 MG tablet Take 500 mg by mouth every 6 (six) hours as needed (pain).     [name] allopurinol (ZYLOPRIM) 100 MG tablet Take 1 tablet (100 mg total) by mouth 2 (two) times daily. 60 tablet 2  . atorvastatin (LIPITOR) 40 MG tablet TAKE 1 BY MOUTH DAILY (Patient taking differently: Take 40 mg by mouth daily at 6 PM. TAKE 1 BY MOUTH DAILY) 90 tablet 2  . cholecalciferol (VITAMIN D) 1000 UNITS tablet Take 2,000 Units by mouth daily.     . Coenzyme Q10 (COQ10) 200 MG CAPS Take 200 mg by mouth daily.    . furosemide (LASIX) 20 MG tablet Take 1 tablet (20 mg total) by mouth daily. 30 tablet 11  . gabapentin (NEURONTIN) 300 MG capsule TAKE 1 BY MOUTH 3 TIMES DAILY (Patient taking differently: Take 300 mg by mouth 3 (three) times daily. TAKE 1 BY MOUTH 3 TIMES DAILY) 270 capsule 1  . lisinopril (PRINIVIL,ZESTRIL) 10 MG tablet TAKE 1 BY MOUTH DAILY (Patient taking differently: Take 10 mg by mouth daily. TAKE 1 BY MOUTH DAILY) 90 tablet  2  . loratadine (CLARITIN) 10 MG tablet Take 1 tablet (10 mg total) by mouth daily. 30 tablet 0  . Multiple Vitamin (MULTIVITAMIN WITH MINERALS) TABS Take 1 tablet by mouth daily.    . sennosides-docusate sodium (SENOKOT-S) 8.6-50 MG tablet Take 1 tablet by mouth 2 (two) times daily.    . traMADol (ULTRAM) 50 MG tablet TAKE 1 TABLET BY MOUTH EVERY 6 HOURS AS NEEDED (Patient taking differently: TAKE 1 TABLET BY MOUTH EVERY 6 HOURS AS NEEDED FOR PAIN) 90 tablet 0   No current facility-administered medications for this visit.     Physical Exam: There were no vitals taken for this visit. He looks comfortable Lungs are clear Cardiac exam shows a regular rate and rhythm with distant valve sounds. There is a loud, harsh systolic murmur.  Diagnostic Tests:   CLINICAL DATA: 74 year old male with history of severe aortic stenosis. Preprocedural study prior to potential transcatheter aortic valve replacement (TAVR).  EXAM: CT ANGIOGRAPHY CHEST, ABDOMEN AND PELVIS  TECHNIQUE: Multidetector CT imaging through the chest, abdomen and pelvis was performed using the standard protocol during bolus administration of intravenous contrast. Multiplanar reconstructed images and MIPs were obtained and reviewed to evaluate the vascular anatomy.  CONTRAST: 17mL OMNIPAQUE IOHEXOL 350 MG/ML SOLN  COMPARISON: No priors.  FINDINGS: Comment: Study is exceedingly limited by excessive image noise from the  patient's large body habitus, accentuated by the fact that the patient could not raise his arms for the examination, which causes a large amount of beam hardening artifact in the thorax.  CTA CHEST FINDINGS  Mediastinum/Lymph Nodes: Heart size is borderline enlarged. There is no significant pericardial fluid, thickening or pericardial calcification. There is atherosclerosis of the thoracic aorta, the great vessels of the mediastinum and the coronary arteries, including calcified  atherosclerotic plaque in the left main, left anterior descending, left circumflex and right coronary arteries. Severe calcifications of the aortic valve, mitral-aortic intervalvular fibrosa, and the mitral annulus. No pathologically enlarged mediastinal or hilar lymph nodes. Esophagus is unremarkable in appearance. No axillary lymphadenopathy.  Lungs/Pleura: 4 mm subpleural nodule in the posterior aspect of the right upper lobe (image 28 of series 7). Small calcified granulomas incidentally noted. No larger more suspicious appearing pulmonary nodules or masses are noted. No acute consolidative airspace disease. No pleural effusions.  Musculoskeletal/Soft Tissues: There are no aggressive appearing lytic or blastic lesions noted in the visualized portions of the skeleton.  CTA ABDOMEN AND PELVIS FINDINGS  Hepatobiliary: Calcified gallstones lying dependently in the gallbladder. No surrounding inflammatory changes to suggest an acute cholecystitis at this time. Liver is largely obscured by artifact, but no large cystic or solid hepatic lesions are noted. No definite intra or extrahepatic biliary ductal dilatation.  Pancreas: No definite pancreatic mass or pancreatic ductal dilatation. No pancreatic or peripancreatic fluid or inflammatory changes.  Spleen: Unremarkable.  Adrenals/Urinary Tract: Bilateral adrenal glands and the right kidney are unremarkable in appearance. Mild atrophy of the left kidney. No hydroureteronephrosis. Urinary bladder is grossly normal in appearance.  Stomach/Bowel: The appearance of the stomach is normal. No pathologic dilatation of small bowel or colon.  Vascular/Lymphatic: Vascular findings and measurements pertinent to the distal T AVR procedure, as detailed below. Mildly enlarged right inguinal lymph node measuring 13 mm in short axis. No other definite lymphadenopathy noted in the abdomen or pelvis.  Reproductive: Prostate gland and  seminal vesicles are unremarkable in appearance.  Other: No significant volume of ascites. No pneumoperitoneum.  Musculoskeletal: There are no aggressive appearing lytic or blastic lesions noted in the visualized portions of the skeleton.  VASCULAR MEASUREMENTS PERTINENT TO TAVR:  AORTA:  Minimal Aortic Diameter - 13 x 5 mm shortly above the aortic bifurcation (difficult accurately assess secondary to artifact)  Severity of Aortic Calcification - severe  RIGHT PELVIS:  Right Common Iliac Artery -  Minimal Diameter - 7.4 x 7.1 mm  Tortuosity - mild  Calcification - moderate  Right External Iliac Artery -  Minimal Diameter - 8.6 x 8.6 mm  Tortuosity - severe  Calcification - minimal  Right Common Femoral Artery -  Minimal Diameter - 6.1 x 4.4 mm  Tortuosity - mild  Calcification - mild  LEFT PELVIS:  Left Common Iliac Artery -  Minimal Diameter - 11.2 x 8.2 mm  Tortuosity - mild  Calcification - moderate  Left External Iliac Artery -  Minimal Diameter - 10.0 x 7.6 mm  Tortuosity - severe  Calcification - mild  Left Common Femoral Artery -  Minimal Diameter - 7.3 x 9.0 mm  Tortuosity - mild  Calcification - mild  Review of the MIP images confirms the above findings.  IMPRESSION: 1. Vascular findings and measurements pertinent to potential TAVR procedure, as discussed above. This patient does appear likely to have suitable pelvic arterial access on the left side, although assessment is exceedingly limited by image noise on today's  examination related to the patient's large body habitus. 2. Extensive calcification of the aortic valve compatible with the patient's reported clinical history of severe aortic stenosis. There is also a large amount of calcification of the mitral-aortic intervalvular fibrosa and the mitral annulus, which may have negative implications for intraprocedural sizing of the  aortic annulus via transesophageal echocardiography. 3. 4 mm subpleural nodule in the posterior aspect of the right upper lobe. This is highly nonspecific, and statistically likely a subpleural lymph node. If the patient is at high risk for bronchogenic carcinoma, follow-up chest CT at 1 year is recommended. If the patient is at low risk, no follow-up is needed. This recommendation follows the consensus statement: Guidelines for Management of Small Pulmonary Nodules Detected on CT Scans: A Statement from the [place] as published in Radiology 2005; 237:395-400. 4. Cholelithiasis without evidence of acute cholecystitis at this time. 5. Additional incidental findings, as above.   Electronically Signed  By: Vinnie Langton M.D.  On: 05/07/2015 08:50        Ref Range 13d ago    FVC-Pre L 2.26   FVC-%Pred-Pre % 48   FVC-Post L 2.17   FVC-%Pred-Post % 46   FVC-%Change-Post % -4   FEV1-Pre L 1.47   FEV1-%Pred-Pre % 43   FEV1-Post L 1.50   FEV1-%Pred-Post % 44   FEV1-%Change-Post % 2   FEV6-Pre L 2.22   FEV6-%Pred-Pre % 50   FEV6-Post L 2.16   FEV6-%Pred-Post % 49   FEV6-%Change-Post % -2   Pre FEV1/FVC ratio % 65   FEV1FVC-%Pred-Pre % 88   Post FEV1/FVC ratio % 69   FEV1FVC-%Change-Post % 6   Pre FEV6/FVC Ratio % 98   FEV6FVC-%Pred-Pre % 104   Post FEV6/FVC ratio % 100   FEV6FVC-%Pred-Post % 105   FEV6FVC-%Change-Post % 1   FEF 25-75 Pre L/sec 0.95   FEF2575-%Pred-Pre % 37   FEF 25-75 Post L/sec 0.82   FEF2575-%Pred-Post % 32   FEF2575-%Change-Post % -14   RV L 2.26   RV % pred % 86   TLC L 4.81   TLC % pred % 64   DLCO unc ml/min/mmHg 20.21   DLCO unc % pred % 57   DL/VA ml/min/mmHg/L 4.34   DL/VA % pred % 92   Resulting Agency [facility]       Specimen Collected: 05/05/15 8:35 AM Last Resulted: 05/05/15 9:45 AM                 Results        Scan on 05/05/2015 9:38 PM by Deneise Lever,  [place] on 05/05/2015 9:38 PM by Deneise Lever, MD            STS Risk Score   Procedure: AV Replacement\cb3 Risk of Mortality: 3.017%\cb3 Morbidity or Mortality: 21.609%\cb3 Long Length of Stay: 9.422%\cb3 Short Length of Stay: 27.817%\cb3 Permanent Stroke: 0.928%\cb3 Prolonged Ventilation: 15.92%\cb3 DSW Infection: 1.706%\cb3 Renal Failure: 6.646%\cb3 Reoperation: 8.096%  Impression:  He has severe aortic stenosis and insufficiency with NYHA class II symptoms at this point although he is in a wheel chair a large part of the time. I have personally reviewed his cardiac cath and he has no coronary disease. He has a mean AV gradient of 70 mm Hg. TEE shows a trileaflet aortic valve with severely calcified leaflets that are poorly mobile. There is severe AS and AI with good LV function. 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 I think TAVR is the best treatment for him. Sizing of his annulus will be more of a challenge since he could not get a cardiac CT but hopefully we can use a combination of TEE and intraoperative balloon sizing to get an adequate estimate of his annular size. He does have calcification of the intervalvular fibrosa and mitral annulus that may make sizing with TEE more difficult and increase the risk of PVL. He has adequate pelvic access for transfemoral approach.   I discussed the relative risks and benefits of all options for the treatment of severe aortic stenosis including long term medical therapy, conventional surgery for aortic valve replacement, and transcatheter aortic valve replacement. I discussed the complications that might develop including but not limited to risks of death, stroke, paravalvular leak, aortic dissection or other major vascular  complications, aortic annulus rupture, device embolization, cardiac rupture or perforation, mitral regurgitation, acute myocardial infarction, arrhythmia, heart block or bradycardia requiring permanent pacemaker placement, congestive heart failure, respiratory failure, renal failure, pneumonia, infection, other late complications related to structural valve deterioration or migration, or other complications that might ultimately cause a temporary or permanent loss of functional independence or other long term morbidity. He and his wife understand and would like to proceed.  Plan:  He will return to see Dr. Roxy Manns for a second surgical opinion and will be scheduled for 05/27/2015 if he agrees.   Gaye Pollack, MD [facility] [phone]

## 2015-05-20 ENCOUNTER — Encounter: Payer: Self-pay | Admitting: Thoracic Surgery (Cardiothoracic Vascular Surgery)

## 2015-05-20 ENCOUNTER — Institutional Professional Consult (permissible substitution) (INDEPENDENT_AMBULATORY_CARE_PROVIDER_SITE_OTHER): Payer: Medicare Other | Admitting: Thoracic Surgery (Cardiothoracic Vascular Surgery)

## 2015-05-20 VITALS — BP 135/75 | HR 71 | Resp 20 | Ht 74.0 in | Wt 381.0 lb

## 2015-05-20 DIAGNOSIS — I359 Nonrheumatic aortic valve disorder, unspecified: Secondary | ICD-10-CM | POA: Diagnosis not present

## 2015-05-20 DIAGNOSIS — I350 Nonrheumatic aortic (valve) stenosis: Secondary | ICD-10-CM

## 2015-05-20 NOTE — Progress Notes (Signed)
[facility]  MULTIDISCIPLINARY HEART VALVE CLINIC    CARDIOTHORACIC SURGERY CONSULTATION REPORT  Referring Provider is Josue Hector, MD PCP is Penni Homans, MD  Chief Complaint  Patient presents with  . Aortic Stenosis    2nd opinion    HPI:  Patient is a 74 year old morbidly obese male with history of aortic stenosis, obstructive sleep apnea on CPAP, hyperlipidemia, chronic venous insufficiency, peripheral neuropathy, and severe degenerative arthritis with extremely limited physical mobility who has been referred for second surgical opinion to discuss treatment options for management of severe symptomatic aortic stenosis.  The patient has history of heart murmur and aortic stenosis dating back several years. In 2011 he was hospitalized with GI bleeding. He was found to have severe aortic stenosis at that time.  His aortic valve was poorly visualized using transthoracic echocardiogram, but mean transvalvular gradient was estimated 55 mmHg and left ventricular ejection fraction was felt to be normal. The patient was felt to be a poor candidate for conventional aortic valve replacement because of his extreme morbid obesity and numerous comorbid medical problems.  He was referred back to Dr. [name] for follow-up in April 2016 after the patient had experienced some increased chest tightness and shortness of breath in the setting of an exacerbation of seasonal allergies.  The patient subsequently underwent transesophageal echocardiogram by Dr. [name] confirming the presence of severe aortic stenosis with peak velocity across the aortic valve measured 4.9 m/s corresponding to mean transvalvular gradient estimated 47 mmHg.  The patient was also noted to have severe aortic insufficiency an normal left ventricular systolic function with ejection fraction estimated 60%. The patient was referred to the multidisciplinary heart valve clinic and underwent diagnostic cardiac catheterization by  Dr. Angelena Form on 05/02/2015 confirming the presence of severe aortic stenosis with mean transvalvular gradient measured 70 mmHg at catheterization. Aortic valve area was calculated 0.8 cm. There was severe pulmonary hypertension with PA pressures measured 82/32.  The patient did not have significant coronary artery disease. Patient was referred for surgical consultation and has been seen previously by Dr. Cyndia Bent who felt the patient would be a poor candidate for conventional surgical aortic valve replacement. The patient underwent CT angiography of the chest, abdomen, and pelvis to evaluate arterial access for possible transcatheter aortic valve replacement. Unfortunately, an attempt at cardiac gated CT angiogram of the heart was unsuccessful because of the patient's extreme size. The patient has been referred for a second surgical consultation to discuss treatment options further.  The patient is married and lives locally in [place] with his wife. 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 He has extremely limited physical mobility and suffers from severe degenerative arthritis in his back and both legs.  He has had multiple surgery on his right knee because of complications following total knee replacement. He developed right foot drop and has no sensation in his right foot. He can walk short distances using a walker with hand braces, but he cannot grip with his hands because of arthritis. For the most part he is restricted to a motorized wheelchair. With his sedentary lifestyle he does not complain much of shortness of breath. However, he admits that when he is more active he gets winded. He denies any history of resting shortness of breath, PND, orthopnea, palpitations, dizzy spells, or syncope. He has had some chest tightness off and on in the past, but he has attributed this  to seasonal allergies. He has severe chronic bilateral  lower extremity edema.  Past Medical History  Diagnosis Date  . Valvular heart disease   . Aortic stenosis   . Osteoarthritis   . Anemia   . OSA (obstructive sleep apnea)   . Bursitis   . Gout   . Arthritis   . Hyperlipidemia   . Morbid obesity   . DDD (degenerative disc disease)   . Mixed sensory-motor polyneuropathy   . Peroneal palsy     significant right foot drop  . Hemorrhoids     hx of bleeding  . Hypertension   . History of shingles   . Measles as a child  . Chicken pox as a child  . Medicare annual wellness visit, subsequent 03/09/2015  . Preventative health care 03/09/2015    Past Surgical History  Procedure Laterality Date  . Total knee arthroplasty      right  . Spine surgery    . Laminotomy  1193    c6-t2  . Hernia repair      umbilical hernia  . Knee surgery Right     multiple knee surgeries due to complication of R TKA  . Colonoscopy N/A 02/07/2013    Procedure: COLONOSCOPY;  Surgeon: Juanita Craver, MD;  Location: [facility] ENDOSCOPY;  Service: Endoscopy;  Laterality: N/A;  . Esophagogastroduodenoscopy N/A 02/07/2013    Procedure: ESOPHAGOGASTRODUODENOSCOPY (EGD);  Surgeon: Juanita Craver, MD;  Location: [facility] ENDOSCOPY;  Service: Endoscopy;  Laterality: N/A;  . Colonoscopy N/A 02/09/2013    Procedure: COLONOSCOPY;  Surgeon: Beryle Beams, MD;  Location: [place];  Service: Endoscopy;  Laterality: N/A;  . Givens capsule study N/A 02/09/2013    Procedure: GIVENS CAPSULE STUDY;  Surgeon: Beryle Beams, MD;  Location: [place];  Service: Endoscopy;  Laterality: N/A;  . Tee without cardioversion N/A 03/07/2015    Procedure: TRANSESOPHAGEAL ECHOCARDIOGRAM (TEE);  Surgeon: Josue Hector, MD;  Location: [facility] ENDOSCOPY;  Service: Cardiovascular;  Laterality: N/A;  ANES TO BRING PROPOFOL PER DOCTOR  . Cardiac catheterization N/A 05/02/2015    Procedure: Right/Left Heart Cath and Coronary Angiography;  Surgeon: Burnell Blanks, MD;  Location: [place] [facility];   Service: Cardiovascular;  Laterality: N/A;    Family History  Problem Relation Age of Onset  . Arthritis Sister     "crippling"  . Arthritis Sister     Social History   Social History  . Marital Status: Married    Spouse Name: N/A  . Number of Children: N/A  . Years of Education: N/A   Occupational History  . retired Administrator    Social History Main Topics  . Smoking status: Former Smoker -- 30 years    Types: Cigarettes, Pipe    Quit date: 09/20/2006  . Smokeless tobacco: Never Used  . Alcohol Use: No  . Drug Use: No  . Sexual Activity: No     Comment: lives with wife, retired, no dietary restrictions   Other Topics Concern  . Not on file   Social History Narrative    Current Outpatient Prescriptions  Medication Sig Dispense Refill  . acetaminophen (TYLENOL) 500 MG tablet Take 500 mg by mouth 2 (two) times daily.     [name] allopurinol (ZYLOPRIM) 100 MG tablet Take 1 tablet (100 mg total) by mouth 2 (two) times daily. (Patient taking differently: Take 100 mg by mouth daily. ) 60 tablet 2  . atorvastatin (LIPITOR) 40 MG tablet TAKE 1 BY MOUTH DAILY (Patient taking  differently: Take 40 mg by mouth daily at 6 PM. TAKE 1 BY MOUTH DAILY) 90 tablet 2  . cholecalciferol (VITAMIN D) 1000 UNITS tablet Take 2,000 Units by mouth daily.     . Coenzyme Q10 (COQ10) 200 MG CAPS Take 200 mg by mouth daily.    . furosemide (LASIX) 20 MG tablet Take 1 tablet (20 mg total) by mouth daily. 30 tablet 11  . gabapentin (NEURONTIN) 300 MG capsule TAKE 1 BY MOUTH 3 TIMES DAILY (Patient taking differently: Take 300 mg by mouth 2 (two) times daily. ) 270 capsule 1  . lisinopril (PRINIVIL,ZESTRIL) 10 MG tablet TAKE 1 BY MOUTH DAILY (Patient taking differently: Take 10 mg by mouth daily. TAKE 1 BY MOUTH DAILY) 90 tablet 2  . loratadine (CLARITIN) 10 MG tablet Take 1 tablet (10 mg total) by mouth daily. 30 tablet 0  . Menthol-Methyl Salicylate (MUSCLE RUB) 10-15 % CREA Apply 1 application topically  as needed for muscle pain.    . Multiple Vitamin (MULTIVITAMIN WITH MINERALS) TABS Take 1 tablet by mouth daily.    . polyethylene glycol (MIRALAX / GLYCOLAX) packet Take 17 g by mouth daily.    [name] senna (SENOKOT) 8.6 MG TABS tablet Take 1 tablet by mouth daily.    . sennosides-docusate sodium (SENOKOT-S) 8.6-50 MG tablet Take 1 tablet by mouth 2 (two) times daily.    . traMADol (ULTRAM) 50 MG tablet TAKE 1 TABLET BY MOUTH EVERY 6 HOURS AS NEEDED (Patient taking differently: TAKE 1 TABLET BY MOUTH EVERY 6 HOURS AS NEEDED FOR PAIN) 90 tablet 0   No current facility-administered medications for this visit.    No Known Allergies    Review of Systems:   General:  normal appetite, decreased energy, + weight gain, no weight loss, no fever  Cardiac:  no chest pain with exertion, no chest pain at rest, + SOB with exertion, no resting SOB, no PND, no orthopnea, no palpitations, no arrhythmia, no atrial fibrillation, + severe LE edema, no dizzy spells, no syncope  Respiratory:  + exertional shortness of breath, no home oxygen, no productive cough, intermittent dry cough, no bronchitis, occasional wheezing, no hemoptysis, no asthma, no pain with inspiration or cough, + sleep apnea, + CPAP at night  GI:   no difficulty swallowing, no reflux, no frequent heartburn, no hiatal hernia, no abdominal pain, no constipation, no diarrhea, no hematochezia, no hematemesis, no melena  GU:   no dysuria,  no frequency, no urinary tract infection, no hematuria, no enlarged prostate, no kidney stones, no kidney disease  Vascular:  no pain suggestive of claudication, no pain in feet, no leg cramps, + varicose veins, no DVT, no non-healing foot ulcer  Neuro:   no stroke, no TIA's, no seizures, no headaches, no temporary blindness one eye,  no slurred speech, + peripheral neuropathy, + chronic pain, + instability of gait, no memory/cognitive dysfunction  Musculoskeletal: + arthritis, + joint swelling, no myalgias, +  difficulty walking, very limited mobility   Skin:   no rash, no itching, no skin infections, no pressure sores or ulcerations  Psych:   no anxiety, no depression, no nervousness, no unusual recent stress  Eyes:   no blurry vision, + floaters, no recent vision changes, + wears glasses or contacts  ENT:   no hearing loss, no loose or painful teeth, no dentures, last saw dentist June 2016  Hematologic:  no easy bruising, no abnormal bleeding, no clotting disorder, no frequent epistaxis  Endocrine:  no diabetes,  does not check CBG's at home           Physical Exam:   BP 135/75 mmHg  Pulse 71  Resp 20  Ht 6\' 2"  (1.88 m)  Wt 381 lb (172.82 kg)  BMI 48.90 kg/m2  SpO2 95%  General:  Morbidly obese male in NAD   HEENT:  Unremarkable   Neck:   no JVD, no bruits, no adenopathy   Chest:   clear to auscultation, symmetrical breath sounds, no wheezes, no rhonchi   CV:   RRR, grade III/VI crescendo/decrescendo murmur heard best at RUSB,  no diastolic murmur  Abdomen:  soft, non-tender, no masses   Extremities:  warm, well-perfused, pulses not palpable, + severe bilateral LE edema  Rectal/GU  Deferred  Neuro:   Grossly non-focal and symmetrical throughout  Skin:   Clean and dry, no rashes, no breakdown but severe changes of chronic venous insufficiency both lower legs   Diagnostic Tests:  Transesophageal Echocardiography  Patient:  Makson, Fusillo MR #:    [id number] Study Date: 03/07/2015 Gender:   M Age:    59 Height:   188 cm Weight:   172.4 kg BSA:    3.08 m^2 Pt. Status: Room:  ADMITTING  Jenkins Rouge, M.D. ATTENDING  Jenkins Rouge, M.D. ORDERING   Jenkins Rouge, M.D. PERFORMING  Jenkins Rouge, M.D. REFERRING  Jenkins Rouge, M.D. SONOGRAPHER Diamond Nickel  cc:  -------------------------------------------------------------------  ------------------------------------------------------------------- Indications:   Aortic stenosis  424.1.  ------------------------------------------------------------------- Study Conclusions  - Impressions: Normal EF 60% Mild MR Normal RV No LAA thrombus No aortic debris NO ASD  Aortic valve trileaflet and severely calcified with restricted motion Critical AS with mean gradient close to 50 mmHg and peak near 100 mmHg even with suboptimal transgastric signals. Also severe AR  Patient weighs 380 lbs with limited functional ability Will have Dr Roxy Manns and Burt Knack review regarding AVR vs TAVR although degree of AR may prohibit latter  Case done with ketamine drip and 40 mg propofol and anesthesia support of airway  Jenkins Rouge MD [facility].  Impressions:  - Normal EF 60% Mild MR Normal RV No LAA thrombus No aortic debris NO ASD  Aortic valve trileaflet and severely calcified with restricted motion Critical AS with mean gradient close to 50 mmHg and peak near 100 mmHg even with suboptimal transgastric signals. Also severe AR  Patient weighs 380 lbs with limited functional ability Will have Dr Roxy Manns and Burt Knack review regarding AVR vs TAVR although degree of AR may prohibit latter  Case done with ketamine drip and 40 mg propofol and anesthesia support of airway  Jenkins Rouge MD [facility].  Diagnostic transesophageal echocardiography. 2D and color Doppler. Birthdate: Patient birthdate: 12-28-1940. Age: Patient is 74 yr old. Sex: Gender: male.  BMI: 48.8 kg/m^2. Blood pressure: 120/61 Patient status: Outpatient. Study date: Study date: 03/07/2015. Study time: 12:16 PM. Location: Endoscopy.  -------------------------------------------------------------------  ------------------------------------------------------------------- Aortic valve:  Doppler:   Mean gradient (S): 47 mm Hg. Peak gradient (S): 97 mm  Hg.  ------------------------------------------------------------------- Measurements  Aortic valve            Value Aortic valve peak velocity, S   492  cm/s Aortic valve mean velocity, S   303  cm/s Aortic valve VTI, S        104  cm Aortic mean gradient, S      47  mm Hg Aortic peak gradient, S      97  mm Hg  Legend: (L) and (H)  mark values outside specified reference range.  ------------------------------------------------------------------- Prepared and Electronically Authenticated by  Jenkins Rouge, M.D. 2016-06-17T10:21:02      CARDIAC CATHETERIZATION  Procedures    Right/Left Heart Cath and Coronary Angiography    PACS Images    Show images for Cardiac catheterization     Link to Procedure Log    Procedure Log      Indications    Severe aortic valve stenosis [I35.0 (ICD-10-CM)]    Technique and Indications    Indication: Severe aortic valve stenosis  Procedure: The risks, benefits, complications, treatment options, and expected outcomes were discussed with the patient. The patient and/or family concurred with the proposed plan, giving informed consent. The patient was brought to the cath lab after IV hydration was begun and oral premedication was given. The patient was further sedated with Versed and Fentanyl. There was an IV present in the right antecubital vein. This area was prepped and draped. I then placed a wire through this IV catheter and replaced it with a 5 Fr brachial sheath. Right heart catheterization performed with a balloon tipped catheter. The right wrist was assessed with a modified Allens test which was positive. The right wrist was prepped and draped in a sterile fashion. 1% lidocaine was used for local anesthesia. Using the modified Seldinger access technique, a 5 French sheath was placed in the right radial artery. 3 mg Verapamil was given through the sheath. 5000 units IV heparin was  given. Standard diagnostic catheters were used to perform selective coronary angiography. I crossed the aortic valve with an AL-1 catheter and a straight wire. LV pressures measured. No LV gram. The sheath was removed from the right radial artery and a Terumo hemostasis band was applied at the arteriotomy site on the right wrist.   No complications.   EBL:5cc    Conclusion    1. No angiographic evidence of CAD 2. Severe aortic valve stenosis (peak to peak gradient 107 mmHg, mean gradient 70.3 mmHg, AVA 0.8 cm2)  Recommendations: Will proceed with planning for TAVR.     Coronary Findings    Dominance: Right   Left Anterior Descending  The vessel is large is angiographically normal.   . First Diagonal Branch   The vessel is large in size.   [name] Second Diagonal Branch   The vessel is small in size.     Left Circumflex  The vessel is large is angiographically normal.   . First Obtuse Marginal Branch   The vessel is small in size.   [name] Second Obtuse Marginal Branch   The vessel is large in size.     Right Coronary Artery  The vessel is large is angiographically normal.       Right Heart Pressures Hemodynamic findings consistent with severe pulmonary hypertension. Elevated LV EDP consistent with volume overload.    Left Heart    Aortic Valve There is severe aortic valve stenosis. The aortic valve is calcified. There is restricted aortic valve motion.    Coronary Diagrams    Diagnostic Diagram            Implants    Name ID Temporary Type Supply   No information to display    Hemo Data       Most Recent Value   Fick Cardiac Output  7.53 L/min   Fick Cardiac Output Index  2.62 (L/min)/BSA   Aortic Mean Gradient  70.3 mmHg   Aortic Peak Gradient  107 mmHg   Aortic Valve Area  0.80   Aortic Value Area Index  0.28 cm2/BSA   RA A Wave  17 mmHg   RA V Wave  17 mmHg   RA Mean  12 mmHg   RV Systolic Pressure  83 mmHg   RV Diastolic Pressure   9 mmHg   RV EDP  16 mmHg   PA Systolic Pressure  82 mmHg   PA Diastolic Pressure  32 mmHg   PA Mean  49 mmHg   AO Systolic Pressure  123456 mmHg   AO Diastolic Pressure  62 mmHg   AO Mean  82 mmHg   LV Systolic Pressure  123456 mmHg   LV Diastolic Pressure  11 mmHg   LV EDP  27 mmHg   Arterial Occlusion Pressure Extended Systolic Pressure  123XX123 mmHg   Arterial Occlusion Pressure Extended Diastolic Pressure  60 mmHg   Arterial Occlusion Pressure Extended Mean Pressure  83 mmHg   Left Ventricular Apex Extended Systolic Pressure  XX123456 mmHg   Left Ventricular Apex Extended Diastolic Pressure  17 mmHg   Left Ventricular Apex Extended EDP Pressure  30 mmHg   QP/QS  1   TPVR Index  18.69 HRUI   TSVR Index  30.89 HRUI   TPVR/TSVR Ratio  0.6      CT ANGIOGRAPHY CHEST, ABDOMEN AND PELVIS  TECHNIQUE: Multidetector CT imaging through the chest, abdomen and pelvis was performed using the standard protocol during bolus administration of intravenous contrast. Multiplanar reconstructed images and MIPs were obtained and reviewed to evaluate the vascular anatomy.  CONTRAST: 148mL OMNIPAQUE IOHEXOL 350 MG/ML SOLN  COMPARISON: No priors.  FINDINGS: Comment: Study is exceedingly limited by excessive image noise from the patient's large body habitus, accentuated by the fact that the patient could not raise his arms for the examination, which causes a large amount of beam hardening artifact in the thorax.  CTA CHEST FINDINGS  Mediastinum/Lymph Nodes: Heart size is borderline enlarged. There is no significant pericardial fluid, thickening or pericardial calcification. There is atherosclerosis of the thoracic aorta, the great vessels of the mediastinum and the coronary arteries, including calcified atherosclerotic plaque in the left main, left anterior descending, left circumflex and right coronary arteries. Severe calcifications of the aortic valve,  mitral-aortic intervalvular fibrosa, and the mitral annulus. No pathologically enlarged mediastinal or hilar lymph nodes. Esophagus is unremarkable in appearance. No axillary lymphadenopathy.  Lungs/Pleura: 4 mm subpleural nodule in the posterior aspect of the right upper lobe (image 28 of series 7). Small calcified granulomas incidentally noted. No larger more suspicious appearing pulmonary nodules or masses are noted. No acute consolidative airspace disease. No pleural effusions.  Musculoskeletal/Soft Tissues: There are no aggressive appearing lytic or blastic lesions noted in the visualized portions of the skeleton.  CTA ABDOMEN AND PELVIS FINDINGS  Hepatobiliary: Calcified gallstones lying dependently in the gallbladder. No surrounding inflammatory changes to suggest an acute cholecystitis at this time. Liver is largely obscured by artifact, but no large cystic or solid hepatic lesions are noted. No definite intra or extrahepatic biliary ductal dilatation.  Pancreas: No definite pancreatic mass or pancreatic ductal dilatation. No pancreatic or peripancreatic fluid or inflammatory changes.  Spleen: Unremarkable.  Adrenals/Urinary Tract: Bilateral adrenal glands and the right kidney are unremarkable in appearance. Mild atrophy of the left kidney. No hydroureteronephrosis. Urinary bladder is grossly normal in appearance.  Stomach/Bowel: The appearance of the stomach is normal. No pathologic dilatation of small bowel or colon.  Vascular/Lymphatic: Vascular findings and measurements pertinent to the distal T  AVR procedure, as detailed below. Mildly enlarged right inguinal lymph node measuring 13 mm in short axis. No other definite lymphadenopathy noted in the abdomen or pelvis.  Reproductive: Prostate gland and seminal vesicles are unremarkable in appearance.  Other: No significant volume of ascites. No pneumoperitoneum.  Musculoskeletal: There are no  aggressive appearing lytic or blastic lesions noted in the visualized portions of the skeleton.  VASCULAR MEASUREMENTS PERTINENT TO TAVR:  AORTA:  Minimal Aortic Diameter - 13 x 5 mm shortly above the aortic bifurcation (difficult accurately assess secondary to artifact)  Severity of Aortic Calcification - severe  RIGHT PELVIS:  Right Common Iliac Artery -  Minimal Diameter - 7.4 x 7.1 mm  Tortuosity - mild  Calcification - moderate  Right External Iliac Artery -  Minimal Diameter - 8.6 x 8.6 mm  Tortuosity - severe  Calcification - minimal  Right Common Femoral Artery -  Minimal Diameter - 6.1 x 4.4 mm  Tortuosity - mild  Calcification - mild  LEFT PELVIS:  Left Common Iliac Artery -  Minimal Diameter - 11.2 x 8.2 mm  Tortuosity - mild  Calcification - moderate  Left External Iliac Artery -  Minimal Diameter - 10.0 x 7.6 mm  Tortuosity - severe  Calcification - mild  Left Common Femoral Artery -  Minimal Diameter - 7.3 x 9.0 mm  Tortuosity - mild  Calcification - mild  Review of the MIP images confirms the above findings.  IMPRESSION: 1. Vascular findings and measurements pertinent to potential TAVR procedure, as discussed above. This patient does appear likely to have suitable pelvic arterial access on the left side, although assessment is exceedingly limited by image noise on today's examination related to the patient's large body habitus. 2. Extensive calcification of the aortic valve compatible with the patient's reported clinical history of severe aortic stenosis. There is also a large amount of calcification of the mitral-aortic intervalvular fibrosa and the mitral annulus, which may have negative implications for intraprocedural sizing of the aortic annulus via transesophageal echocardiography. 3. 4 mm subpleural nodule in the posterior aspect of the right upper lobe. This is highly nonspecific,  and statistically likely a subpleural lymph node. If the patient is at high risk for bronchogenic carcinoma, follow-up chest CT at 1 year is recommended. If the patient is at low risk, no follow-up is needed. This recommendation follows the consensus statement: Guidelines for Management of Small Pulmonary Nodules Detected on CT Scans: A Statement from the [employer] as published in Radiology 2005; 237:395-400. 4. Cholelithiasis without evidence of acute cholecystitis at this time. 5. Additional incidental findings, as above.   Electronically Signed  By: Vinnie Langton M.D.  On: 05/07/2015 08:50   Pulmonary Function Tests  Baseline      Post-bronchodilator  FVC  2.26 L  (48% predicted) FVC  2.17 L  (46% predicted) FEV1  1.47 L  (43% predicted) FEV1  1.50 L  (44% predicted) FEF25-75 0.95 L  (37% predicted) FEF25-75 0.82 L  (32% predicted)  TLC  4.81 L  (64% predicted) RV  2.26 L  (86% predicted) DLCO  57% predicted   STS Risk Calculator  Procedure    AVR  Risk of Mortality   4.2% Morbidity or Mortality  26.7% Prolonged LOS   12.5% Short LOS    23.3% Permanent Stroke   1.0% Prolonged Vent Support  21.6% DSW Infection    1.7% Renal Failure    9.2% Reoperation    8.5%   Impression:  Patient  has stage D severe symptomatic aortic stenosis. I have personally reviewed the patient's recent transesophageal echocardiogram and diagnostic cardiac catheterization. The patient has severe calcification with restricted leaflet mobility involving all 3 leaflets of the patient's aortic valve. Peak velocity across the aortic valve approaches 5 m/s corresponding to mean transvalvular gradient approaching 50 mmHg. Similarly, mean transvalvular gradient measured by catheterization was greater than 50 mmHg.  Left ventricular systolic function is preserved with ejection fraction estimated 60%. The patient does not have significant coronary artery disease, but he does have  long-standing obstructive sleep apnea with severe pulmonary hypertension. The patient has underlying severe chronic lung disease related to his morbid obesity and sleep apnea. In addition, the patient has extremely limited physical mobility because of his extreme morbid obesity and long-standing problems with severe degenerative arthritis. Risks associated with conventional surgical aortic valve replacement would be extremely high. I would not consider this patient a candidate for conventional surgery and I feel that transcatheter aortic valve replacement would be a far less risky and less invasive option. I have personally reviewed the patient's CT angiogram of the chest, abdomen, and pelvis. The quality of the scan is quite poor, but it appears that the patient has adequate pelvic vascular access for a transfemoral approach for TAVR.  Unfortunately, cardiac gated CT angiogram of the heart could not be performed to facilitate accurate sizing and assessment of anatomical features pertinent to TAVR.   Plan:  The patient and his wife were counseled at length regarding treatment alternatives for management of severe symptomatic aortic stenosis. Alternative approaches such as conventional aortic valve replacement, transcatheter aortic valve replacement, and palliative medical therapy were compared and contrasted at length.  The risks associated with conventional surgical aortic valve replacement were been discussed in detail, as were expectations for post-operative convalescence. Long-term prognosis with medical therapy was discussed. This discussion was placed in the context of the patient's own specific clinical presentation and past medical history.  All of their questions been addressed.  The patient hopes to proceed with transcatheter aortic valve replacement as soon as practical, and this procedure has been scheduled for Tuesday 05/27/2015.  Following the decision to proceed with transcatheter aortic valve  replacement, a discussion has been held regarding what types of management strategies would be attempted intraoperatively in the event of life-threatening complications, including whether or not the patient would be considered a candidate for the use of cardiopulmonary bypass and/or conversion to open sternotomy for attempted surgical intervention.  The patient has been advised of a variety of complications that might develop including but not limited to risks of death, stroke, paravalvular leak, aortic dissection or other major vascular complications, aortic annulus rupture, device embolization, cardiac rupture or perforation, mitral regurgitation, acute myocardial infarction, arrhythmia, heart block or bradycardia requiring permanent pacemaker placement, congestive heart failure, respiratory failure, renal failure, pneumonia, infection, other late complications related to structural valve deterioration or migration, or other complications that might ultimately cause a temporary or permanent loss of functional independence or other long term morbidity.  The patient also understands that because cardiac gated CT angiogram of the heart could not be performed, intraoperative transesophageal echocardiogram will be utilized to facilitate valve sizing. This may, with a somewhat increased risk of paravalvular leak and/or other complications related to anatomical considerations that cannot be assessed under the current circumstances.  The patient provides full informed consent for the procedure as described and all questions were answered.   I spent in excess of 90 minutes during the  conduct of this office consultation and >50% of this time involved direct face-to-face encounter with the patient for counseling and/or coordination of their care.     Valentina Gu. Roxy Manns, MD 05/20/2015 4:01 PM

## 2015-05-20 NOTE — Patient Instructions (Signed)
Continue taking all medications without change through the day before surgery.  Have nothing to eat or drink after midnight the night before surgery.  On the morning of surgery you do not need to take any of your regularly scheduled medicines.

## 2015-05-23 ENCOUNTER — Inpatient Hospital Stay (HOSPITAL_COMMUNITY): Admission: RE | Admit: 2015-05-23 | Payer: Medicare Other | Source: Ambulatory Visit

## 2015-05-30 ENCOUNTER — Other Ambulatory Visit (HOSPITAL_COMMUNITY): Payer: Medicare Other

## 2015-06-06 ENCOUNTER — Encounter (HOSPITAL_COMMUNITY)
Admission: RE | Admit: 2015-06-06 | Discharge: 2015-06-06 | Disposition: A | Discharge status: Home / Self Care | Payer: Medicare Other | Source: Ambulatory Visit | Attending: Surgery | Admitting: Surgery

## 2015-06-06 ENCOUNTER — Encounter (HOSPITAL_COMMUNITY)
Admission: RE | Admit: 2015-06-06 | Discharge: 2015-06-06 | Disposition: A | Discharge status: Home / Self Care | Payer: Medicare Other | Source: Ambulatory Visit | Attending: Cardiovascular Disease | Admitting: Cardiovascular Disease

## 2015-06-06 ENCOUNTER — Encounter (HOSPITAL_COMMUNITY): Payer: Self-pay

## 2015-06-06 ENCOUNTER — Encounter (HOSPITAL_COMMUNITY): Payer: Self-pay | Admitting: *Deleted

## 2015-06-06 VITALS — Wt 381.0 lb

## 2015-06-06 VITALS — BP 124/43 | HR 72 | Temp 98.8°F | Resp 20 | Ht 74.0 in

## 2015-06-06 DIAGNOSIS — Z01812 Encounter for preprocedural laboratory examination: Secondary | ICD-10-CM | POA: Diagnosis not present

## 2015-06-06 DIAGNOSIS — I4510 Unspecified right bundle-branch block: Secondary | ICD-10-CM | POA: Diagnosis not present

## 2015-06-06 DIAGNOSIS — I350 Nonrheumatic aortic (valve) stenosis: Secondary | ICD-10-CM

## 2015-06-06 DIAGNOSIS — I517 Cardiomegaly: Secondary | ICD-10-CM | POA: Diagnosis not present

## 2015-06-06 DIAGNOSIS — Z01818 Encounter for other preprocedural examination: Secondary | ICD-10-CM | POA: Insufficient documentation

## 2015-06-06 DIAGNOSIS — Z0183 Encounter for blood typing: Secondary | ICD-10-CM | POA: Insufficient documentation

## 2015-06-06 HISTORY — DX: Frequency of micturition: R35.0

## 2015-06-06 HISTORY — DX: Unspecified cataract: H26.9

## 2015-06-06 HISTORY — DX: Polyneuropathy, unspecified: G62.9

## 2015-06-06 HISTORY — DX: Adverse effect of unspecified anesthetic, initial encounter: T41.45XA

## 2015-06-06 HISTORY — DX: Pneumonia, unspecified organism: J18.9

## 2015-06-06 HISTORY — DX: Urgency of urination: R39.15

## 2015-06-06 HISTORY — DX: Personal history of other medical treatment: Z92.89

## 2015-06-06 HISTORY — DX: Personal history of other venous thrombosis and embolism: Z86.718

## 2015-06-06 HISTORY — DX: Other complications of anesthesia, initial encounter: T88.59XA

## 2015-06-06 LAB — COMPREHENSIVE METABOLIC PANEL
ALT: 20 U/L (ref 17–63)
AST: 23 U/L (ref 15–41)
Albumin: 3.8 g/dL (ref 3.5–5.0)
Alkaline Phosphatase: 75 U/L (ref 38–126)
Anion gap: 9 (ref 5–15)
BUN: 20 mg/dL (ref 6–20)
CO2: 25 mmol/L (ref 22–32)
Calcium: 9.4 mg/dL (ref 8.9–10.3)
Chloride: 103 mmol/L (ref 101–111)
Creatinine, Ser: 1.02 mg/dL (ref 0.61–1.24)
GFR calc Af Amer: >60 mL/min (ref >60)
GFR calc non Af Amer: >60 mL/min (ref >60)
Glucose, Bld: 98 mg/dL (ref 65–99)
Potassium: 4.9 mmol/L (ref 3.5–5.1)
Sodium: 137 mmol/L (ref 135–145)
Total Bilirubin: 0.6 mg/dL (ref 0.3–1.2)
Total Protein: 6.4 g/dL — ABNORMAL LOW (ref 6.5–8.1)

## 2015-06-06 LAB — BLOOD GAS, ARTERIAL
Acid-Base Excess: 3 mmol/L — ABNORMAL HIGH (ref 0.0–2.0)
Bicarbonate: 27.5 mEq/L — ABNORMAL HIGH (ref 20.0–24.0)
Drawn by: 206361
FIO2: 0.21
O2 Saturation: 95.1 %
Patient temperature: 98.6
TCO2: 28.9 mmol/L (ref 0–100)
pCO2 arterial: 45.9 mmHg — ABNORMAL HIGH (ref 35.0–45.0)
pH, Arterial: 7.395 (ref 7.350–7.450)
pO2, Arterial: 75.6 mmHg — ABNORMAL LOW (ref 80.0–100.0)

## 2015-06-06 LAB — CBC
HCT: 39.5 % (ref 39.0–52.0)
Hemoglobin: 12.4 g/dL — ABNORMAL LOW (ref 13.0–17.0)
MCH: 29 pg (ref 26.0–34.0)
MCHC: 31.4 g/dL (ref 30.0–36.0)
MCV: 92.5 fL (ref 78.0–100.0)
Platelets: 151 10*3/uL (ref 150–400)
RBC: 4.27 MIL/uL (ref 4.22–5.81)
RDW: 16.2 % — ABNORMAL HIGH (ref 11.5–15.5)
WBC: 7.9 10*3/uL (ref 4.0–10.5)

## 2015-06-06 LAB — PROTIME-INR
INR: 1.05 (ref 0.00–1.49)
Prothrombin Time: 13.9 seconds (ref 11.6–15.2)

## 2015-06-06 LAB — SURGICAL PCR SCREEN
MRSA, PCR: NEGATIVE
Staphylococcus aureus: POSITIVE — AB

## 2015-06-06 LAB — APTT: aPTT: 30 seconds (ref 24–37)

## 2015-06-06 MED ORDER — CHLORHEXIDINE GLUCONATE 4 % EX LIQD: 60.0000 mL | Freq: Once | CUTANEOUS | Status: DC

## 2015-06-06 MED ORDER — CHLORHEXIDINE GLUCONATE 4 % EX LIQD: 30.0000 mL | CUTANEOUS | Status: DC

## 2015-06-06 NOTE — Pre-Procedure Instructions (Signed)
NICHOALS COLASUONNO  06/06/2015      [id number] - [place], [place] - [name] [place] [address] Phone: [phone] Fax: [phone]    Your procedure is scheduled on Tues, Sept 20 @ 1:00 PM  Report to [facility] Admitting at 10:30 AM  Call this number if you have problems the morning of surgery:  [phone]   Remember:  Do not eat food or drink liquids after midnight.  Take these medicines the morning of surgery with A SIP OF WATER Allopurinol(Zyloprim),Gabapentin(Neurontin), and Claritin(Loratadine)               No Goody's,BC's,Aleve,Aspirin,Ibuprofen,Fish Oil,or any Herbal Medications.    Do not wear jewelry.  Do not wear lotions, powders, or colognes.               Men may shave face and neck.  Do not bring valuables to the hospital.  [facility] is not responsible for any belongings or valuables.  Contacts, dentures or bridgework may not be worn into surgery.  Leave your suitcase in the car.  After surgery it may be brought to your room.  For patients admitted to the hospital, discharge time will be determined by your treatment team.  Patients discharged the day of surgery will not be allowed to drive home.    Special instructions:  [facility] - Preparing for Surgery  Before surgery, you can play an important role.  Because skin is not sterile, your skin needs to be as free of germs as possible.  You can reduce the number of germs on you skin by washing with CHG (chlorahexidine gluconate) soap before surgery.  CHG is an antiseptic cleaner which kills germs and bonds with the skin to continue killing germs even after washing.  Please DO NOT use if you have an allergy to CHG or antibacterial soaps.  If your skin becomes reddened/irritated stop using the CHG and inform your nurse when you arrive at Short Stay.  Do not shave (including legs and underarms) for at least 48 hours prior to  the first CHG shower.  You may shave your face.  Please follow these instructions carefully:   1.  Shower with CHG Soap the night before surgery and the                                morning of Surgery.  2.  If you choose to wash your hair, wash your hair first as usual with your       normal shampoo.  3.  After you shampoo, rinse your hair and body thoroughly to remove the                      Shampoo.  4.  Use CHG as you would any other liquid soap.  You can apply chg directly       to the skin and wash gently with scrungie or a clean washcloth.  5.  Apply the CHG Soap to your body ONLY FROM THE NECK DOWN.        Do not use on open wounds or open sores.  Avoid contact with your eyes,       ears, mouth and genitals (private parts).  Wash genitals (private parts)       with your normal soap.  6.  Wash thoroughly, paying special attention to the area where your surgery        will be performed.  7.  Thoroughly rinse your body with warm water from the neck down.  8.  DO NOT shower/wash with your normal soap after using and rinsing off       the CHG Soap.  9.  Pat yourself dry with a clean towel.            10.  Wear clean pajamas.            11.  Place clean sheets on your bed the night of your first shower and do not        sleep with pets.  Day of Surgery  Do not apply any lotions/deoderants the morning of surgery.  Please wear clean clothes to the hospital/surgery center.    Please read over the following fact sheets that you were given. Pain Booklet, Coughing and Deep Breathing, Blood Transfusion Information, MRSA Information and Surgical Site Infection Prevention

## 2015-06-06 NOTE — Progress Notes (Addendum)
Cardiologist is Dr.Nishan with last visit about 3 months ago  Medical Md is Dr.Stacey Charlett Blake  Echo report in epic from 02-25-15  Stress test report in epic from 05-27-10  Heart cath report in epic from 05-02-15  Denies CXR in past 2 weeks

## 2015-06-07 LAB — HEMOGLOBIN A1C
Hgb A1c MFr Bld: 5.7 % — ABNORMAL HIGH (ref 4.8–5.6)
Mean Plasma Glucose: 117 mg/dL

## 2015-06-09 MED ORDER — SODIUM CHLORIDE 0.9 % IV SOLN
INTRAVENOUS | Status: AC
  Filled 2015-06-09: qty 30

## 2015-06-09 MED ORDER — POTASSIUM CHLORIDE 2 MEQ/ML IV SOLN
80.0000 meq | INTRAVENOUS | Status: DC
  Filled 2015-06-09: qty 40

## 2015-06-09 MED ORDER — MAGNESIUM SULFATE 50 % IJ SOLN
40.0000 meq | INTRAMUSCULAR | Status: DC
  Filled 2015-06-09: qty 10

## 2015-06-09 MED ORDER — VANCOMYCIN HCL 10 G IV SOLR
1500.0000 mg | INTRAVENOUS | Status: AC
  Administered 2015-06-10: 1500 mg via INTRAVENOUS
  Filled 2015-06-09: qty 1500

## 2015-06-09 MED ORDER — DEXTROSE 5 % IV SOLN
0.0000 ug/min | INTRAVENOUS | Status: AC
  Filled 2015-06-09: qty 4

## 2015-06-09 MED ORDER — DEXTROSE 5 % IV SOLN
1.5000 g | INTRAVENOUS | Status: AC
  Administered 2015-06-10: 1.5 g via INTRAVENOUS
  Filled 2015-06-09: qty 1.5

## 2015-06-09 MED ORDER — DEXMEDETOMIDINE HCL IN NACL 400 MCG/100ML IV SOLN
0.1000 ug/kg/h | INTRAVENOUS | Status: AC
Start: 2015-06-10 — End: 2015-06-10
  Administered 2015-06-10: .5 ug/kg/h via INTRAVENOUS
  Filled 2015-06-09 (×2): qty 100

## 2015-06-09 MED ORDER — NITROGLYCERIN IN D5W 200-5 MCG/ML-% IV SOLN
2.0000 ug/min | INTRAVENOUS | Status: DC
  Filled 2015-06-09: qty 250

## 2015-06-09 MED ORDER — DOPAMINE-DEXTROSE 3.2-5 MG/ML-% IV SOLN
0.0000 ug/kg/min | INTRAVENOUS | Status: DC
  Filled 2015-06-09: qty 250

## 2015-06-09 MED ORDER — INSULIN REGULAR HUMAN 100 UNIT/ML IJ SOLN
INTRAMUSCULAR | Status: AC
  Administered 2015-06-10: .7 [IU]/h via INTRAVENOUS
  Filled 2015-06-09: qty 2.5

## 2015-06-09 MED ORDER — PHENYLEPHRINE HCL 10 MG/ML IJ SOLN
30.0000 ug/min | INTRAVENOUS | Status: AC
  Administered 2015-06-10: 10 ug/min via INTRAVENOUS
  Filled 2015-06-09: qty 2

## 2015-06-09 MED ORDER — NOREPINEPHRINE BITARTRATE 1 MG/ML IV SOLN
0.0000 ug/min | INTRAVENOUS | Status: AC
  Administered 2015-06-10: 1 ug/min via INTRAVENOUS
  Filled 2015-06-09: qty 4

## 2015-06-10 ENCOUNTER — Encounter (HOSPITAL_COMMUNITY)
Admission: RE | Disposition: A | Discharge status: Home / Self Care | Payer: Medicare Other | Source: Ambulatory Visit | Attending: Cardiovascular Disease

## 2015-06-10 ENCOUNTER — Inpatient Hospital Stay (HOSPITAL_COMMUNITY)
Admission: RE | Admit: 2015-06-10 | Discharge: 2015-06-13 | DRG: 267 | Disposition: A | Discharge status: Home / Self Care | Payer: Medicare Other | Source: Ambulatory Visit | Attending: Cardiovascular Disease | Admitting: Cardiovascular Disease

## 2015-06-10 ENCOUNTER — Inpatient Hospital Stay (HOSPITAL_COMMUNITY): Payer: Medicare Other | Admitting: Certified Registered"

## 2015-06-10 ENCOUNTER — Inpatient Hospital Stay (HOSPITAL_COMMUNITY): Payer: Medicare Other

## 2015-06-10 ENCOUNTER — Encounter (HOSPITAL_COMMUNITY): Payer: Self-pay | Admitting: *Deleted

## 2015-06-10 DIAGNOSIS — I10 Essential (primary) hypertension: Secondary | ICD-10-CM | POA: Diagnosis present

## 2015-06-10 DIAGNOSIS — Z6841 Body Mass Index (BMI) 40.0 and over, adult: Secondary | ICD-10-CM

## 2015-06-10 DIAGNOSIS — E785 Hyperlipidemia, unspecified: Secondary | ICD-10-CM | POA: Diagnosis present

## 2015-06-10 DIAGNOSIS — M109 Gout, unspecified: Secondary | ICD-10-CM | POA: Diagnosis present

## 2015-06-10 DIAGNOSIS — I350 Nonrheumatic aortic (valve) stenosis: Secondary | ICD-10-CM | POA: Diagnosis not present

## 2015-06-10 DIAGNOSIS — M21379 Foot drop, unspecified foot: Secondary | ICD-10-CM | POA: Diagnosis present

## 2015-06-10 DIAGNOSIS — Z952 Presence of prosthetic heart valve: Secondary | ICD-10-CM | POA: Diagnosis not present

## 2015-06-10 DIAGNOSIS — R001 Bradycardia, unspecified: Secondary | ICD-10-CM | POA: Diagnosis not present

## 2015-06-10 DIAGNOSIS — D62 Acute posthemorrhagic anemia: Secondary | ICD-10-CM | POA: Diagnosis not present

## 2015-06-10 DIAGNOSIS — Z87891 Personal history of nicotine dependence: Secondary | ICD-10-CM

## 2015-06-10 DIAGNOSIS — Z954 Presence of other heart-valve replacement: Secondary | ICD-10-CM | POA: Diagnosis not present

## 2015-06-10 DIAGNOSIS — I352 Nonrheumatic aortic (valve) stenosis with insufficiency: Principal | ICD-10-CM | POA: Diagnosis present

## 2015-06-10 DIAGNOSIS — Q8500 Neurofibromatosis, unspecified: Secondary | ICD-10-CM | POA: Diagnosis not present

## 2015-06-10 DIAGNOSIS — Z79899 Other long term (current) drug therapy: Secondary | ICD-10-CM | POA: Diagnosis not present

## 2015-06-10 DIAGNOSIS — Z006 Encounter for examination for normal comparison and control in clinical research program: Secondary | ICD-10-CM

## 2015-06-10 DIAGNOSIS — Z953 Presence of xenogenic heart valve: Secondary | ICD-10-CM

## 2015-06-10 HISTORY — DX: Edema, unspecified: R60.9

## 2015-06-10 HISTORY — PX: TEE WITHOUT CARDIOVERSION: SHX5443

## 2015-06-10 HISTORY — PX: TRANSCATHETER AORTIC VALVE REPLACEMENT, TRANSFEMORAL: SHX6400

## 2015-06-10 HISTORY — DX: Nonrheumatic aortic (valve) stenosis: I35.0

## 2015-06-10 HISTORY — DX: Constipation, unspecified: K59.00

## 2015-06-10 LAB — POCT I-STAT, CHEM 8
BUN: 21 mg/dL — ABNORMAL HIGH (ref 6–20)
Calcium, Ion: 1.18 mmol/L (ref 1.13–1.30)
Chloride: 110 mmol/L (ref 101–111)
Creatinine, Ser: 1 mg/dL (ref 0.61–1.24)
Glucose, Bld: 121 mg/dL — ABNORMAL HIGH (ref 65–99)
HCT: 33 % — ABNORMAL LOW (ref 39.0–52.0)
Hemoglobin: 11.2 g/dL — ABNORMAL LOW (ref 13.0–17.0)
Potassium: 4.4 mmol/L (ref 3.5–5.1)
Sodium: 136 mmol/L (ref 135–145)
TCO2: 28 mmol/L (ref 0–100)

## 2015-06-10 LAB — CBC
HCT: 34.5 % — ABNORMAL LOW (ref 39.0–52.0)
HCT: 33.4 % — ABNORMAL LOW (ref 39.0–52.0)
Hemoglobin: 11 g/dL — ABNORMAL LOW (ref 13.0–17.0)
Hemoglobin: 10.7 g/dL — ABNORMAL LOW (ref 13.0–17.0)
MCH: 29.4 pg (ref 26.0–34.0)
MCH: 29.5 pg (ref 26.0–34.0)
MCHC: 31.9 g/dL (ref 30.0–36.0)
MCHC: 32 g/dL (ref 30.0–36.0)
MCV: 92.2 fL (ref 78.0–100.0)
MCV: 92 fL (ref 78.0–100.0)
Platelets: 129 10*3/uL — ABNORMAL LOW (ref 150–400)
Platelets: 110 10*3/uL — ABNORMAL LOW (ref 150–400)
RBC: 3.74 MIL/uL — ABNORMAL LOW (ref 4.22–5.81)
RBC: 3.63 MIL/uL — ABNORMAL LOW (ref 4.22–5.81)
RDW: 16.2 % — ABNORMAL HIGH (ref 11.5–15.5)
RDW: 16.1 % — ABNORMAL HIGH (ref 11.5–15.5)
WBC: 7.8 10*3/uL (ref 4.0–10.5)
WBC: 8.2 10*3/uL (ref 4.0–10.5)

## 2015-06-10 LAB — GLUCOSE, CAPILLARY
Glucose-Capillary: 103 mg/dL — ABNORMAL HIGH (ref 65–99)
Glucose-Capillary: 118 mg/dL — ABNORMAL HIGH (ref 65–99)
Glucose-Capillary: 118 mg/dL — ABNORMAL HIGH (ref 65–99)
Glucose-Capillary: 112 mg/dL — ABNORMAL HIGH (ref 65–99)
Glucose-Capillary: 118 mg/dL — ABNORMAL HIGH (ref 65–99)

## 2015-06-10 LAB — POCT I-STAT 3, ART BLOOD GAS (G3+)
Acid-Base Excess: 4 mmol/L — ABNORMAL HIGH (ref 0.0–2.0)
Bicarbonate: 30 mEq/L — ABNORMAL HIGH (ref 20.0–24.0)
O2 Saturation: 99 %
Patient temperature: 36.4
TCO2: 32 mmol/L (ref 0–100)
pCO2 arterial: 47.8 mmHg — ABNORMAL HIGH (ref 35.0–45.0)
pH, Arterial: 7.404 (ref 7.350–7.450)
pO2, Arterial: 135 mmHg — ABNORMAL HIGH (ref 80.0–100.0)

## 2015-06-10 LAB — PREPARE RBC (CROSSMATCH)

## 2015-06-10 LAB — CREATININE, SERUM
Creatinine, Ser: 0.98 mg/dL (ref 0.61–1.24)
GFR calc Af Amer: >60 mL/min (ref >60)
GFR calc non Af Amer: >60 mL/min (ref >60)

## 2015-06-10 LAB — POCT I-STAT 4, (NA,K, GLUC, HGB,HCT)
Glucose, Bld: 120 mg/dL — ABNORMAL HIGH (ref 65–99)
HCT: 35 % — ABNORMAL LOW (ref 39.0–52.0)
Hemoglobin: 11.9 g/dL — ABNORMAL LOW (ref 13.0–17.0)
Potassium: 4.4 mmol/L (ref 3.5–5.1)
Sodium: 139 mmol/L (ref 135–145)

## 2015-06-10 LAB — PROTIME-INR
INR: 1.32 (ref 0.00–1.49)
Prothrombin Time: 16.5 seconds — ABNORMAL HIGH (ref 11.6–15.2)

## 2015-06-10 LAB — APTT: aPTT: 35 seconds (ref 24–37)

## 2015-06-10 LAB — MAGNESIUM: Magnesium: 2.4 mg/dL (ref 1.7–2.4)

## 2015-06-10 SURGERY — IMPLANTATION, AORTIC VALVE, TRANSCATHETER, FEMORAL APPROACH
Anesthesia: General | Site: Groin

## 2015-06-10 MED ORDER — ACETAMINOPHEN 160 MG/5ML PO SOLN: 1000.0000 mg | Freq: Four times a day (QID) | ORAL | Status: DC

## 2015-06-10 MED ORDER — ASPIRIN 81 MG PO CHEW
324.0000 mg | CHEWABLE_TABLET | Freq: Every day | ORAL | Status: DC
Start: 2015-06-11 — End: 2015-06-13
  Filled 2015-06-10: qty 4

## 2015-06-10 MED ORDER — PHENYLEPHRINE HCL 10 MG/ML IJ SOLN
0.0000 ug/min | INTRAMUSCULAR | Status: DC
  Filled 2015-06-10: qty 2

## 2015-06-10 MED ORDER — ACETAMINOPHEN 650 MG RE SUPP
650.0000 mg | Freq: Once | RECTAL | Status: AC
  Administered 2015-06-10: 650 mg via RECTAL

## 2015-06-10 MED ORDER — FENTANYL CITRATE (PF) 250 MCG/5ML IJ SOLN
INTRAMUSCULAR | Status: AC
  Filled 2015-06-10: qty 5

## 2015-06-10 MED ORDER — SODIUM CHLORIDE 0.9 % IV SOLN
INTRAVENOUS | Status: AC
  Administered 2015-06-10: 17:00:00 via INTRAVENOUS

## 2015-06-10 MED ORDER — PANTOPRAZOLE SODIUM 40 MG PO TBEC
40.0000 mg | DELAYED_RELEASE_TABLET | Freq: Every day | ORAL | Status: DC
  Administered 2015-06-12 – 2015-06-13 (×2): 40 mg via ORAL
  Filled 2015-06-10 (×2): qty 1

## 2015-06-10 MED ORDER — FAMOTIDINE IN NACL 20-0.9 MG/50ML-% IV SOLN
20.0000 mg | Freq: Two times a day (BID) | INTRAVENOUS | Status: AC
  Administered 2015-06-10 (×2): 20 mg via INTRAVENOUS
  Filled 2015-06-10: qty 50

## 2015-06-10 MED ORDER — POTASSIUM CHLORIDE 10 MEQ/50ML IV SOLN: 10.0000 meq | INTRAVENOUS | Status: AC

## 2015-06-10 MED ORDER — MAGNESIUM SULFATE 4 GM/100ML IV SOLN
4.0000 g | Freq: Once | INTRAVENOUS | Status: AC
  Administered 2015-06-10: 4 g via INTRAVENOUS
  Filled 2015-06-10: qty 100

## 2015-06-10 MED ORDER — DEXMEDETOMIDINE HCL IN NACL 200 MCG/50ML IV SOLN
0.1000 ug/kg/h | INTRAVENOUS | Status: DC
  Administered 2015-06-10: 0.5 ug/kg/h via INTRAVENOUS

## 2015-06-10 MED ORDER — MIDAZOLAM HCL 2 MG/2ML IJ SOLN: 2.0000 mg | INTRAMUSCULAR | Status: DC | PRN

## 2015-06-10 MED ORDER — SODIUM CHLORIDE 0.9 % IV SOLN
INTRAVENOUS | Status: DC
  Filled 2015-06-10 (×2): qty 2.5

## 2015-06-10 MED ORDER — SUCCINYLCHOLINE CHLORIDE 20 MG/ML IJ SOLN
INTRAMUSCULAR | Status: DC | PRN
  Administered 2015-06-10: 180 mg via INTRAVENOUS

## 2015-06-10 MED ORDER — LACTATED RINGERS IV SOLN
INTRAVENOUS | Status: DC
  Administered 2015-06-10 (×2): via INTRAVENOUS

## 2015-06-10 MED ORDER — TRAMADOL HCL 50 MG PO TABS: 50.0000 mg | ORAL_TABLET | Freq: Four times a day (QID) | ORAL | Status: DC | PRN

## 2015-06-10 MED ORDER — ROCURONIUM BROMIDE 50 MG/5ML IV SOLN
INTRAVENOUS | Status: AC
  Filled 2015-06-10: qty 2

## 2015-06-10 MED ORDER — MIDAZOLAM HCL 5 MG/5ML IJ SOLN
INTRAMUSCULAR | Status: DC | PRN
  Administered 2015-06-10 (×2): 1 mg via INTRAVENOUS

## 2015-06-10 MED ORDER — GLYCOPYRROLATE 0.2 MG/ML IJ SOLN
INTRAMUSCULAR | Status: DC | PRN
  Administered 2015-06-10 (×2): 0.2 mg via INTRAVENOUS

## 2015-06-10 MED ORDER — ATROPINE SULFATE 0.1 MG/ML IJ SOLN
INTRAMUSCULAR | Status: AC
  Filled 2015-06-10: qty 10

## 2015-06-10 MED ORDER — LIDOCAINE HCL (CARDIAC) 20 MG/ML IV SOLN
INTRAVENOUS | Status: DC | PRN
  Administered 2015-06-10: 100 mg via INTRAVENOUS

## 2015-06-10 MED ORDER — SODIUM CHLORIDE 0.9 % IV SOLN: Freq: Once | INTRAVENOUS | Status: DC

## 2015-06-10 MED ORDER — 0.9 % SODIUM CHLORIDE (POUR BTL) OPTIME
TOPICAL | Status: DC | PRN
  Administered 2015-06-10: 5000 mL

## 2015-06-10 MED ORDER — CHLORHEXIDINE GLUCONATE 0.12% ORAL RINSE (MEDLINE KIT)
15.0000 mL | Freq: Two times a day (BID) | OROMUCOSAL | Status: DC
  Administered 2015-06-10 – 2015-06-12 (×3): 15 mL via OROMUCOSAL

## 2015-06-10 MED ORDER — ATORVASTATIN CALCIUM 40 MG PO TABS
40.0000 mg | ORAL_TABLET | Freq: Every day | ORAL | Status: DC
  Administered 2015-06-11 – 2015-06-12 (×2): 40 mg via ORAL
  Filled 2015-06-10 (×3): qty 1

## 2015-06-10 MED ORDER — LACTATED RINGERS IV SOLN: INTRAVENOUS | Status: DC

## 2015-06-10 MED ORDER — ALLOPURINOL 100 MG PO TABS
100.0000 mg | ORAL_TABLET | Freq: Every day | ORAL | Status: DC
  Administered 2015-06-11 – 2015-06-13 (×3): 100 mg via ORAL
  Filled 2015-06-10 (×3): qty 1

## 2015-06-10 MED ORDER — ATROPINE SULFATE 0.1 MG/ML IJ SOLN
INTRAMUSCULAR | Status: AC
  Administered 2015-06-10: 1 mg
  Filled 2015-06-10: qty 10

## 2015-06-10 MED ORDER — DOPAMINE-DEXTROSE 3.2-5 MG/ML-% IV SOLN
0.0000 ug/kg/min | INTRAVENOUS | Status: DC
  Administered 2015-06-10: 5 ug/kg/min via INTRAVENOUS

## 2015-06-10 MED ORDER — OXYCODONE HCL 5 MG PO TABS
5.0000 mg | ORAL_TABLET | ORAL | Status: DC | PRN
  Administered 2015-06-11: 5 mg via ORAL
  Administered 2015-06-12: 10 mg via ORAL
  Filled 2015-06-10: qty 2
  Filled 2015-06-10: qty 1

## 2015-06-10 MED ORDER — LISINOPRIL 10 MG PO TABS
10.0000 mg | ORAL_TABLET | Freq: Every day | ORAL | Status: DC
  Administered 2015-06-12 – 2015-06-13 (×2): 10 mg via ORAL
  Filled 2015-06-10 (×3): qty 1

## 2015-06-10 MED ORDER — ROCURONIUM BROMIDE 50 MG/5ML IV SOLN
INTRAVENOUS | Status: AC
  Filled 2015-06-10: qty 1

## 2015-06-10 MED ORDER — FUROSEMIDE 20 MG PO TABS
20.0000 mg | ORAL_TABLET | Freq: Every day | ORAL | Status: DC
  Administered 2015-06-11 – 2015-06-13 (×3): 20 mg via ORAL
  Filled 2015-06-10 (×3): qty 1

## 2015-06-10 MED ORDER — CLOPIDOGREL BISULFATE 75 MG PO TABS
75.0000 mg | ORAL_TABLET | Freq: Every day | ORAL | Status: DC
  Administered 2015-06-11 – 2015-06-13 (×3): 75 mg via ORAL
  Filled 2015-06-10 (×4): qty 1

## 2015-06-10 MED ORDER — SUCCINYLCHOLINE CHLORIDE 20 MG/ML IJ SOLN
INTRAMUSCULAR | Status: AC
  Filled 2015-06-10: qty 1

## 2015-06-10 MED ORDER — ACETAMINOPHEN 160 MG/5ML PO SOLN: 650.0000 mg | Freq: Once | ORAL | Status: AC

## 2015-06-10 MED ORDER — INSULIN REGULAR BOLUS VIA INFUSION
0.0000 [IU] | Freq: Three times a day (TID) | INTRAVENOUS | Status: DC
  Filled 2015-06-10: qty 10

## 2015-06-10 MED ORDER — ANTISEPTIC ORAL RINSE SOLUTION (CORINZ)
7.0000 mL | Freq: Four times a day (QID) | OROMUCOSAL | Status: DC
  Administered 2015-06-11 – 2015-06-12 (×6): 7 mL via OROMUCOSAL

## 2015-06-10 MED ORDER — GERHARDT'S BUTT CREAM
TOPICAL_CREAM | Freq: Every day | CUTANEOUS | Status: DC
  Administered 2015-06-10 – 2015-06-12 (×3): via TOPICAL
  Filled 2015-06-10: qty 1

## 2015-06-10 MED ORDER — POLYETHYLENE GLYCOL 3350 17 G PO PACK
17.0000 g | PACK | Freq: Every day | ORAL | Status: DC
  Administered 2015-06-11 – 2015-06-13 (×3): 17 g via ORAL
  Filled 2015-06-10 (×3): qty 1

## 2015-06-10 MED ORDER — FENTANYL CITRATE (PF) 100 MCG/2ML IJ SOLN
INTRAMUSCULAR | Status: DC | PRN
  Administered 2015-06-10: 50 ug via INTRAVENOUS
  Administered 2015-06-10: 100 ug via INTRAVENOUS
  Administered 2015-06-10 (×2): 50 ug via INTRAVENOUS

## 2015-06-10 MED ORDER — GABAPENTIN 300 MG PO CAPS
300.0000 mg | ORAL_CAPSULE | Freq: Two times a day (BID) | ORAL | Status: DC
  Administered 2015-06-11 – 2015-06-13 (×5): 300 mg via ORAL
  Filled 2015-06-10 (×6): qty 1

## 2015-06-10 MED ORDER — ASPIRIN EC 325 MG PO TBEC
325.0000 mg | DELAYED_RELEASE_TABLET | Freq: Every day | ORAL | Status: DC
  Administered 2015-06-12 – 2015-06-13 (×2): 325 mg via ORAL
  Filled 2015-06-10 (×3): qty 1

## 2015-06-10 MED ORDER — ATROPINE SULFATE 1 MG/ML IJ SOLN
INTRAMUSCULAR | Status: DC | PRN
  Administered 2015-06-10: .2 mg via INTRAVENOUS
  Administered 2015-06-10: .1 mg via INTRAVENOUS

## 2015-06-10 MED ORDER — NITROGLYCERIN IN D5W 200-5 MCG/ML-% IV SOLN: 0.0000 ug/min | INTRAVENOUS | Status: DC

## 2015-06-10 MED ORDER — ROCURONIUM BROMIDE 100 MG/10ML IV SOLN
INTRAVENOUS | Status: DC | PRN
  Administered 2015-06-10 (×3): 50 mg via INTRAVENOUS
  Administered 2015-06-10: 25 mg via INTRAVENOUS

## 2015-06-10 MED ORDER — PROPOFOL 10 MG/ML IV BOLUS
INTRAVENOUS | Status: AC
  Filled 2015-06-10: qty 20

## 2015-06-10 MED ORDER — PROTAMINE SULFATE 10 MG/ML IV SOLN
INTRAVENOUS | Status: DC | PRN
  Administered 2015-06-10 (×3): 50 mg via INTRAVENOUS
  Administered 2015-06-10: 40 mg via INTRAVENOUS

## 2015-06-10 MED ORDER — SODIUM CHLORIDE 0.9 % IV SOLN: INTRAVENOUS | Status: DC

## 2015-06-10 MED ORDER — CEFUROXIME SODIUM 1.5 G IJ SOLR
1.5000 g | Freq: Two times a day (BID) | INTRAMUSCULAR | Status: AC
  Administered 2015-06-10 – 2015-06-12 (×4): 1.5 g via INTRAVENOUS
  Filled 2015-06-10 (×4): qty 1.5

## 2015-06-10 MED ORDER — ATROPINE SULFATE 0.1 MG/ML IJ SOLN
1.0000 mg | Freq: Once | INTRAMUSCULAR | Status: AC
  Administered 2015-06-10: 1 mg via INTRAVENOUS

## 2015-06-10 MED ORDER — SODIUM CHLORIDE 0.9 % IV SOLN
INTRAVENOUS | Status: DC | PRN
  Administered 2015-06-10: 500 mL

## 2015-06-10 MED ORDER — PROPOFOL 10 MG/ML IV BOLUS
INTRAVENOUS | Status: DC | PRN
  Administered 2015-06-10: 140 mg via INTRAVENOUS

## 2015-06-10 MED ORDER — HEPARIN SODIUM (PORCINE) 1000 UNIT/ML IJ SOLN
INTRAMUSCULAR | Status: DC | PRN
  Administered 2015-06-10: 19000 [IU] via INTRAVENOUS

## 2015-06-10 MED ORDER — METOPROLOL TARTRATE 1 MG/ML IV SOLN: 2.5000 mg | INTRAVENOUS | Status: DC | PRN

## 2015-06-10 MED ORDER — ONDANSETRON HCL 4 MG/2ML IJ SOLN
4.0000 mg | Freq: Four times a day (QID) | INTRAMUSCULAR | Status: DC | PRN
  Administered 2015-06-11: 4 mg via INTRAVENOUS
  Filled 2015-06-10: qty 2

## 2015-06-10 MED ORDER — MORPHINE SULFATE (PF) 2 MG/ML IV SOLN: 2.0000 mg | INTRAVENOUS | Status: DC | PRN

## 2015-06-10 MED ORDER — MORPHINE SULFATE (PF) 2 MG/ML IV SOLN: 1.0000 mg | INTRAVENOUS | Status: AC | PRN

## 2015-06-10 MED ORDER — ALBUMIN HUMAN 5 % IV SOLN
250.0000 mL | INTRAVENOUS | Status: DC | PRN
  Administered 2015-06-10: 250 mL via INTRAVENOUS

## 2015-06-10 MED ORDER — SENNA 8.6 MG PO TABS
1.0000 | ORAL_TABLET | Freq: Every day | ORAL | Status: DC
  Administered 2015-06-12 – 2015-06-13 (×2): 8.6 mg via ORAL
  Filled 2015-06-10 (×3): qty 1

## 2015-06-10 MED ORDER — MIDAZOLAM HCL 2 MG/2ML IJ SOLN
INTRAMUSCULAR | Status: AC
  Filled 2015-06-10: qty 4

## 2015-06-10 MED ORDER — IODIXANOL 320 MG/ML IV SOLN
INTRAVENOUS | Status: DC | PRN
  Administered 2015-06-10: 66.1 mL via INTRAVENOUS

## 2015-06-10 MED ORDER — ACETAMINOPHEN 500 MG PO TABS
1000.0000 mg | ORAL_TABLET | Freq: Four times a day (QID) | ORAL | Status: DC
  Administered 2015-06-11 – 2015-06-13 (×10): 1000 mg via ORAL
  Filled 2015-06-10 (×13): qty 2

## 2015-06-10 MED ORDER — IODIXANOL 320 MG/ML IV SOLN
INTRAVENOUS | Status: DC | PRN
  Administered 2015-06-10: 50 mL via INTRAVENOUS

## 2015-06-10 MED ORDER — COQ10 200 MG PO CAPS: 200.0000 mg | ORAL_CAPSULE | Freq: Every day | ORAL | Status: DC

## 2015-06-10 MED ORDER — VANCOMYCIN HCL IN DEXTROSE 1-5 GM/200ML-% IV SOLN
1000.0000 mg | Freq: Once | INTRAVENOUS | Status: AC
  Administered 2015-06-10: 1000 mg via INTRAVENOUS
  Filled 2015-06-10: qty 200

## 2015-06-10 MED ORDER — LACTATED RINGERS IV SOLN: 500.0000 mL | Freq: Once | INTRAVENOUS | Status: DC | PRN

## 2015-06-10 SURGICAL SUPPLY — 103 items
ADAPTER UNIV SWAN GANZ BIP (ADAPTER) IMPLANT
ADAPTER UNV SWAN GANZ BIP (ADAPTER) ×1
ADH SKN CLS APL DERMABOND .7 (GAUZE/BANDAGES/DRESSINGS) ×2
ADPR CATH UNV NS SG CATH (ADAPTER) ×2
BAG BANDED W/RUBBER/TAPE 36X54 (MISCELLANEOUS) ×3 IMPLANT
BAG DECANTER FOR FLEXI CONT (MISCELLANEOUS) IMPLANT
BAG EQP BAND 135X91 W/RBR TAPE (MISCELLANEOUS) ×2
BAG SNAP BAND KOVER 36X36 (MISCELLANEOUS) ×6 IMPLANT
BLADE OSCILLATING /SAGITTAL (BLADE) IMPLANT
BLADE STERNUM SYSTEM 6 (BLADE) ×3 IMPLANT
BLADE SURG ROTATE 9660 (MISCELLANEOUS) IMPLANT
CABLE PACING FASLOC BIEGE (MISCELLANEOUS) ×1 IMPLANT
CABLE PACING FASLOC BLUE (MISCELLANEOUS) ×3 IMPLANT
CANNULA FEM VENOUS REMOTE 22FR (CANNULA) IMPLANT
CANNULA OPTISITE PERFUSION 16F (CANNULA) IMPLANT
CANNULA OPTISITE PERFUSION 18F (CANNULA) IMPLANT
CATH DIAG EXPO 6F AL2 (CATHETERS) ×1 IMPLANT
CATH DIAG EXPO 6F VENT PIG 145 (CATHETERS) ×3 IMPLANT
CATH EXPO 5FR FR4 (CATHETERS) ×1 IMPLANT
CATH S G BIP PACING (SET/KITS/TRAYS/PACK) ×4 IMPLANT
CATH STRAIGHT 5FR 65CM (CATHETERS) ×1 IMPLANT
CLIP TI MEDIUM 24 (CLIP) ×3 IMPLANT
CLIP TI WIDE RED SMALL 24 (CLIP) ×3 IMPLANT
CONT SPEC STER OR (MISCELLANEOUS) ×3 IMPLANT
COVER BACK TABLE 24X17X13 BIG (DRAPES) ×3 IMPLANT
COVER DOME SNAP 22 D (MISCELLANEOUS) ×3 IMPLANT
COVER MAYO STAND STRL (DRAPES) ×3 IMPLANT
COVER TABLE BACK 60X90 (DRAPES) ×3 IMPLANT
CRADLE DONUT ADULT HEAD (MISCELLANEOUS) ×3 IMPLANT
DERMABOND ADVANCED (GAUZE/BANDAGES/DRESSINGS) ×1
DERMABOND ADVANCED .7 DNX12 (GAUZE/BANDAGES/DRESSINGS) ×2 IMPLANT
DRAPE INCISE IOBAN 66X45 STRL (DRAPES) ×1 IMPLANT
DRAPE SLUSH MACHINE 52X66 (DRAPES) ×3 IMPLANT
DRAPE TABLE COVER HEAVY DUTY (DRAPES) ×3 IMPLANT
DRSG TEGADERM 4X4.75 (GAUZE/BANDAGES/DRESSINGS) ×3 IMPLANT
ELECT REM PT RETURN 9FT ADLT (ELECTROSURGICAL) ×6
ELECTRODE REM PT RTRN 9FT ADLT (ELECTROSURGICAL) ×4 IMPLANT
FELT TEFLON 6X6 (MISCELLANEOUS) ×3 IMPLANT
FEMORAL VENOUS CANN RAP (CANNULA) IMPLANT
GAUZE SPONGE 4X4 12PLY STRL (GAUZE/BANDAGES/DRESSINGS) ×3 IMPLANT
GLOVE BIO SURGEON STRL SZ8 (GLOVE) ×6 IMPLANT
GLOVE EUDERMIC 7 POWDERFREE (GLOVE) ×6 IMPLANT
GLOVE ORTHO TXT STRL SZ7.5 (GLOVE) ×6 IMPLANT
GOWN STRL REUS W/ TWL LRG LVL3 (GOWN DISPOSABLE) ×6 IMPLANT
GOWN STRL REUS W/ TWL XL LVL3 (GOWN DISPOSABLE) ×12 IMPLANT
GOWN STRL REUS W/TWL LRG LVL3 (GOWN DISPOSABLE) ×9
GOWN STRL REUS W/TWL XL LVL3 (GOWN DISPOSABLE) ×18
GUIDEWIRE SAF TJ AMPL .035X180 (WIRE) ×3 IMPLANT
GUIDEWIRE SAFE TJ AMPLATZ EXST (WIRE) ×1 IMPLANT
GUIDEWIRE STRAIGHT .035 260CM (WIRE) ×1 IMPLANT
HOVERMATT SINGLE USE (MISCELLANEOUS) ×1 IMPLANT
INSERT FOGARTY 61MM (MISCELLANEOUS) ×3 IMPLANT
INSERT FOGARTY SM (MISCELLANEOUS) ×6 IMPLANT
INSERT FOGARTY XLG (MISCELLANEOUS) IMPLANT
KIT BASIN OR (CUSTOM PROCEDURE TRAY) ×3 IMPLANT
KIT DILATOR VASC 18G NDL (KITS) IMPLANT
KIT HEART LEFT (KITS) ×1 IMPLANT
KIT ROOM TURNOVER OR (KITS) ×3 IMPLANT
KIT SUCTION CATH 14FR (SUCTIONS) ×6 IMPLANT
NDL PERC 18GX7CM (NEEDLE) ×2 IMPLANT
NEEDLE PERC 18GX7CM (NEEDLE) ×3 IMPLANT
NS IRRIG 1000ML POUR BTL (IV SOLUTION) ×9 IMPLANT
PACK AORTA (CUSTOM PROCEDURE TRAY) ×3 IMPLANT
PAD ARMBOARD 7.5X6 YLW CONV (MISCELLANEOUS) ×6 IMPLANT
PAD ELECT DEFIB RADIOL ZOLL (MISCELLANEOUS) ×3 IMPLANT
RIGHT CORONARY 4, 5 FRENCH ×1 IMPLANT
SHEATH PINNACLE 6F 10CM (SHEATH) ×2 IMPLANT
SLEEVE REPOSITIONING LENGTH 30 (MISCELLANEOUS) ×1 IMPLANT
SPONGE GAUZE 4X4 12PLY STER LF (GAUZE/BANDAGES/DRESSINGS) ×1 IMPLANT
SPONGE LAP 4X18 X RAY DECT (DISPOSABLE) ×3 IMPLANT
STOPCOCK 4 WAY LG BORE MALE ST (IV SETS) ×2 IMPLANT
STOPCOCK MORSE 400PSI 3WAY (MISCELLANEOUS) ×6 IMPLANT
SUT ETHIBOND X763 2 0 SH 1 (SUTURE) ×3 IMPLANT
SUT GORETEX CV 4 TH 22 36 (SUTURE) ×3 IMPLANT
SUT GORETEX CV4 TH-18 (SUTURE) ×9 IMPLANT
SUT GORETEX TH-18 36 INCH (SUTURE) ×6 IMPLANT
SUT PROLENE 3 0 SH1 36 (SUTURE) IMPLANT
SUT PROLENE 4 0 RB 1 (SUTURE) ×3
SUT PROLENE 4-0 RB1 .5 CRCL 36 (SUTURE) ×2 IMPLANT
SUT PROLENE 5 0 C 1 36 (SUTURE) ×6 IMPLANT
SUT PROLENE 6 0 C 1 30 (SUTURE) ×6 IMPLANT
SUT SILK  1 MH (SUTURE) ×2
SUT SILK 1 MH (SUTURE) ×2 IMPLANT
SUT SILK 2 0 SH CR/8 (SUTURE) IMPLANT
SUT VIC AB 2-0 CT1 27 (SUTURE) ×3
SUT VIC AB 2-0 CT1 TAPERPNT 27 (SUTURE) ×2 IMPLANT
SUT VIC AB 2-0 CTX 36 (SUTURE) IMPLANT
SUT VIC AB 3-0 SH 8-18 (SUTURE) ×6 IMPLANT
SYR 30ML LL (SYRINGE) ×6 IMPLANT
SYR 50ML LL SCALE MARK (SYRINGE) ×4 IMPLANT
SYRINGE 10CC LL (SYRINGE) ×1 IMPLANT
SYRINGE 12CC LL (MISCELLANEOUS) ×1 IMPLANT
TAPE CLOTH SURG 4X10 WHT LF (GAUZE/BANDAGES/DRESSINGS) ×1 IMPLANT
TOWEL OR 17X26 10 PK STRL BLUE (TOWEL DISPOSABLE) ×6 IMPLANT
TRANSDUCER W/STOPCOCK (MISCELLANEOUS) ×2 IMPLANT
TRAY FOLEY IC TEMP SENS 16FR (CATHETERS) ×3 IMPLANT
TRUE DILATATION BALLOON 20MMX4.5CM ×1 IMPLANT
TUBING ART PRESS 72  MALE/FEM (TUBING) ×1
TUBING ART PRESS 72 MALE/FEM (TUBING) IMPLANT
TUBING HIGH PRESSURE 120CM (CONNECTOR) ×3 IMPLANT
VALVE HEART TRANSCATH SZ3 26MM (Prosthesis & Implant Heart) ×1 IMPLANT
WIRE AMPLATZ SS-J .035X180CM (WIRE) ×1 IMPLANT
WIRE J 3MM .035X145CM (WIRE) ×1 IMPLANT

## 2015-06-10 NOTE — Anesthesia Procedure Notes (Signed)
Procedure Name: Intubation Date/Time: 06/10/2015 12:21 PM Performed by: Sampson Si E Pre-anesthesia Checklist: Patient identified, Emergency Drugs available, Suction available, Patient being monitored and Timeout performed Patient Re-evaluated:Patient Re-evaluated prior to inductionOxygen Delivery Method: Circle system utilized Preoxygenation: Pre-oxygenation with 100% oxygen Intubation Type: IV induction, Rapid sequence and Cricoid Pressure applied Laryngoscope Size: Mac and 4 Grade View: Grade I Tube type: Oral Tube size: 8.5 mm Number of attempts: 1 Airway Equipment and Method: Stylet Placement Confirmation: ETT inserted through vocal cords under direct vision,  positive ETCO2 and breath sounds checked- equal and bilateral Secured at: 23 cm Tube secured with: Tape Dental Injury: Teeth and Oropharynx as per pre-operative assessment  Comments: Inserted by Biagio Borg SRNA

## 2015-06-10 NOTE — H&P (View-Only) (Signed)
HPI:  He returns today to discuss the results of his cardiac cath and CT scans performed in workup for TAVR. Cath shows no coronary disease. His mean AV gradient was 70 and the peak gradient was 107. Unfortunately he could not fit into the scanner for a gated cardiac CT due to his body size and arthritis in his shoulders preventing him from getting his arms above his head. He did have a CTA of the chest which showed a large amount of calcification of the intervalvular fibrosa and the mitral annulus that may interfere with sizing of the annulus by TEE in the OR and may increase the risk of paravalvular leak. Dr. Meda Coffee did review his previous TEE and feels that his annulus is not too large for a 29 mm valve so TAVR seems to be an appropriate option. His pelvic arterial access appears adequate for transfemoral approach. He is symptomatically unchanged with exertional dyspnea and fatigue.   Current Outpatient Prescriptions  Medication Sig Dispense Refill  . acetaminophen (TYLENOL) 500 MG tablet Take 500 mg by mouth every 6 (six) hours as needed (pain).     [name] allopurinol (ZYLOPRIM) 100 MG tablet Take 1 tablet (100 mg total) by mouth 2 (two) times daily. 60 tablet 2  . atorvastatin (LIPITOR) 40 MG tablet TAKE 1 BY MOUTH DAILY (Patient taking differently: Take 40 mg by mouth daily at 6 PM. TAKE 1 BY MOUTH DAILY) 90 tablet 2  . cholecalciferol (VITAMIN D) 1000 UNITS tablet Take 2,000 Units by mouth daily.     . Coenzyme Q10 (COQ10) 200 MG CAPS Take 200 mg by mouth daily.    . furosemide (LASIX) 20 MG tablet Take 1 tablet (20 mg total) by mouth daily. 30 tablet 11  . gabapentin (NEURONTIN) 300 MG capsule TAKE 1 BY MOUTH 3 TIMES DAILY (Patient taking differently: Take 300 mg by mouth 3 (three) times daily. TAKE 1 BY MOUTH 3 TIMES DAILY) 270 capsule 1  . lisinopril (PRINIVIL,ZESTRIL) 10 MG tablet TAKE 1 BY MOUTH DAILY (Patient taking differently: Take 10 mg by mouth daily. TAKE 1 BY MOUTH DAILY) 90 tablet  2  . loratadine (CLARITIN) 10 MG tablet Take 1 tablet (10 mg total) by mouth daily. 30 tablet 0  . Multiple Vitamin (MULTIVITAMIN WITH MINERALS) TABS Take 1 tablet by mouth daily.    . sennosides-docusate sodium (SENOKOT-S) 8.6-50 MG tablet Take 1 tablet by mouth 2 (two) times daily.    . traMADol (ULTRAM) 50 MG tablet TAKE 1 TABLET BY MOUTH EVERY 6 HOURS AS NEEDED (Patient taking differently: TAKE 1 TABLET BY MOUTH EVERY 6 HOURS AS NEEDED FOR PAIN) 90 tablet 0   No current facility-administered medications for this visit.     Physical Exam: There were no vitals taken for this visit. He looks comfortable Lungs are clear Cardiac exam shows a regular rate and rhythm with distant valve sounds. There is a loud, harsh systolic murmur.  Diagnostic Tests:   CLINICAL DATA: 74 year old male with history of severe aortic stenosis. Preprocedural study prior to potential transcatheter aortic valve replacement (TAVR).  EXAM: CT ANGIOGRAPHY CHEST, ABDOMEN AND PELVIS  TECHNIQUE: Multidetector CT imaging through the chest, abdomen and pelvis was performed using the standard protocol during bolus administration of intravenous contrast. Multiplanar reconstructed images and MIPs were obtained and reviewed to evaluate the vascular anatomy.  CONTRAST: 118mL OMNIPAQUE IOHEXOL 350 MG/ML SOLN  COMPARISON: No priors.  FINDINGS: Comment: Study is exceedingly limited by excessive image noise from the  patient's large body habitus, accentuated by the fact that the patient could not raise his arms for the examination, which causes a large amount of beam hardening artifact in the thorax.  CTA CHEST FINDINGS  Mediastinum/Lymph Nodes: Heart size is borderline enlarged. There is no significant pericardial fluid, thickening or pericardial calcification. There is atherosclerosis of the thoracic aorta, the great vessels of the mediastinum and the coronary arteries, including calcified  atherosclerotic plaque in the left main, left anterior descending, left circumflex and right coronary arteries. Severe calcifications of the aortic valve, mitral-aortic intervalvular fibrosa, and the mitral annulus. No pathologically enlarged mediastinal or hilar lymph nodes. Esophagus is unremarkable in appearance. No axillary lymphadenopathy.  Lungs/Pleura: 4 mm subpleural nodule in the posterior aspect of the right upper lobe (image 28 of series 7). Small calcified granulomas incidentally noted. No larger more suspicious appearing pulmonary nodules or masses are noted. No acute consolidative airspace disease. No pleural effusions.  Musculoskeletal/Soft Tissues: There are no aggressive appearing lytic or blastic lesions noted in the visualized portions of the skeleton.  CTA ABDOMEN AND PELVIS FINDINGS  Hepatobiliary: Calcified gallstones lying dependently in the gallbladder. No surrounding inflammatory changes to suggest an acute cholecystitis at this time. Liver is largely obscured by artifact, but no large cystic or solid hepatic lesions are noted. No definite intra or extrahepatic biliary ductal dilatation.  Pancreas: No definite pancreatic mass or pancreatic ductal dilatation. No pancreatic or peripancreatic fluid or inflammatory changes.  Spleen: Unremarkable.  Adrenals/Urinary Tract: Bilateral adrenal glands and the right kidney are unremarkable in appearance. Mild atrophy of the left kidney. No hydroureteronephrosis. Urinary bladder is grossly normal in appearance.  Stomach/Bowel: The appearance of the stomach is normal. No pathologic dilatation of small bowel or colon.  Vascular/Lymphatic: Vascular findings and measurements pertinent to the distal T AVR procedure, as detailed below. Mildly enlarged right inguinal lymph node measuring 13 mm in short axis. No other definite lymphadenopathy noted in the abdomen or pelvis.  Reproductive: Prostate gland and  seminal vesicles are unremarkable in appearance.  Other: No significant volume of ascites. No pneumoperitoneum.  Musculoskeletal: There are no aggressive appearing lytic or blastic lesions noted in the visualized portions of the skeleton.  VASCULAR MEASUREMENTS PERTINENT TO TAVR:  AORTA:  Minimal Aortic Diameter - 13 x 5 mm shortly above the aortic bifurcation (difficult accurately assess secondary to artifact)  Severity of Aortic Calcification - severe  RIGHT PELVIS:  Right Common Iliac Artery -  Minimal Diameter - 7.4 x 7.1 mm  Tortuosity - mild  Calcification - moderate  Right External Iliac Artery -  Minimal Diameter - 8.6 x 8.6 mm  Tortuosity - severe  Calcification - minimal  Right Common Femoral Artery -  Minimal Diameter - 6.1 x 4.4 mm  Tortuosity - mild  Calcification - mild  LEFT PELVIS:  Left Common Iliac Artery -  Minimal Diameter - 11.2 x 8.2 mm  Tortuosity - mild  Calcification - moderate  Left External Iliac Artery -  Minimal Diameter - 10.0 x 7.6 mm  Tortuosity - severe  Calcification - mild  Left Common Femoral Artery -  Minimal Diameter - 7.3 x 9.0 mm  Tortuosity - mild  Calcification - mild  Review of the MIP images confirms the above findings.  IMPRESSION: 1. Vascular findings and measurements pertinent to potential TAVR procedure, as discussed above. This patient does appear likely to have suitable pelvic arterial access on the left side, although assessment is exceedingly limited by image noise on today's  examination related to the patient's large body habitus. 2. Extensive calcification of the aortic valve compatible with the patient's reported clinical history of severe aortic stenosis. There is also a large amount of calcification of the mitral-aortic intervalvular fibrosa and the mitral annulus, which may have negative implications for intraprocedural sizing of the  aortic annulus via transesophageal echocardiography. 3. 4 mm subpleural nodule in the posterior aspect of the right upper lobe. This is highly nonspecific, and statistically likely a subpleural lymph node. If the patient is at high risk for bronchogenic carcinoma, follow-up chest CT at 1 year is recommended. If the patient is at low risk, no follow-up is needed. This recommendation follows the consensus statement: Guidelines for Management of Small Pulmonary Nodules Detected on CT Scans: A Statement from the [place] as published in Radiology 2005; 237:395-400. 4. Cholelithiasis without evidence of acute cholecystitis at this time. 5. Additional incidental findings, as above.   Electronically Signed  By: Vinnie Langton M.D.  On: 05/07/2015 08:50        Ref Range 13d ago    FVC-Pre L 2.26   FVC-%Pred-Pre % 48   FVC-Post L 2.17   FVC-%Pred-Post % 46   FVC-%Change-Post % -4   FEV1-Pre L 1.47   FEV1-%Pred-Pre % 43   FEV1-Post L 1.50   FEV1-%Pred-Post % 44   FEV1-%Change-Post % 2   FEV6-Pre L 2.22   FEV6-%Pred-Pre % 50   FEV6-Post L 2.16   FEV6-%Pred-Post % 49   FEV6-%Change-Post % -2   Pre FEV1/FVC ratio % 65   FEV1FVC-%Pred-Pre % 88   Post FEV1/FVC ratio % 69   FEV1FVC-%Change-Post % 6   Pre FEV6/FVC Ratio % 98   FEV6FVC-%Pred-Pre % 104   Post FEV6/FVC ratio % 100   FEV6FVC-%Pred-Post % 105   FEV6FVC-%Change-Post % 1   FEF 25-75 Pre L/sec 0.95   FEF2575-%Pred-Pre % 37   FEF 25-75 Post L/sec 0.82   FEF2575-%Pred-Post % 32   FEF2575-%Change-Post % -14   RV L 2.26   RV % pred % 86   TLC L 4.81   TLC % pred % 64   DLCO unc ml/min/mmHg 20.21   DLCO unc % pred % 57   DL/VA ml/min/mmHg/L 4.34   DL/VA % pred % 92   Resulting Agency [facility]       Specimen Collected: 05/05/15 8:35 AM Last Resulted: 05/05/15 9:45 AM                 Results        Scan on 05/05/2015 9:38 PM by Deneise Lever,  [place] on 05/05/2015 9:38 PM by Deneise Lever, MD            STS Risk Score   Procedure: AV Replacement\cb3 Risk of Mortality: 3.017%\cb3 Morbidity or Mortality: 21.609%\cb3 Long Length of Stay: 9.422%\cb3 Short Length of Stay: 27.817%\cb3 Permanent Stroke: 0.928%\cb3 Prolonged Ventilation: 15.92%\cb3 DSW Infection: 1.706%\cb3 Renal Failure: 6.646%\cb3 Reoperation: 8.096%  Impression:  He has severe aortic stenosis and insufficiency with NYHA class II symptoms at this point although he is in a wheel chair a large part of the time. I have personally reviewed his cardiac cath and he has no coronary disease. He has a mean AV gradient of 70 mm Hg. TEE shows a trileaflet aortic valve with severely calcified leaflets that are poorly mobile. There is severe AS and AI with good LV function. 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 I think TAVR is the best treatment for him. Sizing of his annulus will be more of a challenge since he could not get a cardiac CT but hopefully we can use a combination of TEE and intraoperative balloon sizing to get an adequate estimate of his annular size. He does have calcification of the intervalvular fibrosa and mitral annulus that may make sizing with TEE more difficult and increase the risk of PVL. He has adequate pelvic access for transfemoral approach.   I discussed the relative risks and benefits of all options for the treatment of severe aortic stenosis including long term medical therapy, conventional surgery for aortic valve replacement, and transcatheter aortic valve replacement. I discussed the complications that might develop including but not limited to risks of death, stroke, paravalvular leak, aortic dissection or other major vascular  complications, aortic annulus rupture, device embolization, cardiac rupture or perforation, mitral regurgitation, acute myocardial infarction, arrhythmia, heart block or bradycardia requiring permanent pacemaker placement, congestive heart failure, respiratory failure, renal failure, pneumonia, infection, other late complications related to structural valve deterioration or migration, or other complications that might ultimately cause a temporary or permanent loss of functional independence or other long term morbidity. He and his wife understand and would like to proceed.  Plan:  He will return to see Dr. Roxy Manns for a second surgical opinion and will be scheduled for 05/27/2015 if he agrees.   Gaye Pollack, MD [facility] [phone]

## 2015-06-10 NOTE — CV Procedure (Signed)
[facility]  TAVR OPERATIVE NOTE   Date of Procedure:  06/10/2015  Preoperative Diagnosis: Severe Aortic Stenosis   Postoperative Diagnosis: Same   Procedure:    Transcatheter Aortic Valve Replacement - Transfemoral Approach  Edwards Sapien 3 THV (size 26 mm, model #[id number] , serial [id number] )  Co-Surgeons:  Gaye Pollack, MD and Lauree Chandler, MD  Assistants:   Darylene Price, MD  Anesthesiologist:  Roberts Gaudy, MD  Echocardiographer:  Ena Dawley, MD  Pre-operative Echo Findings:  Severe aortic stenosis  Normal left ventricular systolic function  Post-operative Echo Findings:  Trivial paravalvular leak  Normal left ventricular systolic function  BRIEF CLINICAL NOTE AND INDICATIONS FOR SURGERY  74 yo male with history of morbid obesity, mild HTN, HLD, gout and severe aortic stenosis. He is confined to a wheelchair most of the time and has episodes of gout very often. He does walk short distances around the house. He has foot drop following his knee surgery in 2008. He has had many issues with his right knee following his surgery. He has a prior cervical spine surgery with diagnosis of spinal cord tumor. He believes that he has neurofibromatosis and his sons have multiple tumors consistent with this as well. He has been followed for aortic valve stenosis for many years. Echo in 2011 with severe AS and moderate AI. He has had no change in his symptoms over the last few years. He has baseline SOB but no real change. No chest pain. He weight is stable at 380 lbs. He was told in 2011 that he was not a candidate for AVR given his obesity. He was recently seen by Dr. [name] and given his severe AS, he would like to explore the option of TAVR. TEE 03/07/15 with normal LV function, severe AS with mean gradient of 50 mmHg and peak gradient of 100 mmHg. There was also severe AI. Cardiac cath with normal coronary arteries. CTA chest/abd/pelvis with suitable  arterial access for TAVR from the transfemoral approach.   During the course of the patient's preoperative work up they have been evaluated comprehensively by a multidisciplinary team of specialists coordinated through the [facility] in the [facility].  They have been demonstrated to suffer from symptomatic severe aortic stenosis as noted above. The patient has been counseled extensively as to the relative risks and benefits of all options for the treatment of severe aortic stenosis including long term medical therapy, conventional surgery for aortic valve replacement, and transcatheter aortic valve replacement.  The patient has been independently evaluated by two cardiac surgeons including Dr Roxy Manns and Dr. Cyndia Bent, and they are felt to be at high risk for conventional surgical aortic valve replacement based upon a predicted risk of mortality. Both surgeons indicated the patient would be a poor candidate for conventional surgery because of comorbidities including morbid obesity, poor mobility.  Based upon review of all of the patient's preoperative diagnostic tests they are felt to be candidate for transcatheter aortic valve replacement using the transfemoral approach as an alternative to high risk conventional surgery.    Following the decision to proceed with transcatheter aortic valve replacement, a discussion has been held regarding what types of management strategies would be attempted intraoperatively in the event of life-threatening complications, including whether or not the patient would be considered a candidate for the use of cardiopulmonary bypass and/or conversion to open sternotomy for attempted surgical intervention.  The patient has been advised of a variety  of complications that might develop peculiar to this approach including but not limited to risks of death, stroke, paravalvular leak, aortic dissection or other major vascular complications,  aortic annulus rupture, device embolization, cardiac rupture or perforation, acute myocardial infarction, arrhythmia, heart block or bradycardia requiring permanent pacemaker placement, congestive heart failure, respiratory failure, renal failure, pneumonia, infection, other late complications related to structural valve deterioration or migration, or other complications that might ultimately cause a temporary or permanent loss of functional independence or other long term morbidity.  The patient provides full informed consent for the procedure as described and all questions were answered preoperatively.   DETAILS OF THE OPERATIVE PROCEDURE  PREPARATION:    The patient is brought to the operating room on the above mentioned date and central monitoring was established by the anesthesia team including placement of Swan-Ganz catheter and radial arterial line. The patient is placed in the supine position on the operating table.  Intravenous antibiotics are administered. General endotracheal anesthesia is induced uneventfully. A Foley catheter is placed.  Baseline transesophageal echocardiogram was performed. The patient's chest, abdomen, both groins, and both lower extremities are prepared and draped in a sterile manner. A time out procedure is performed.   PERIPHERAL ACCESS:    Using the modified Seldinger technique, femoral arterial and venous access was obtained with placement of 6 Fr sheaths on the right side.  A pigtail diagnostic catheter was passed through the right femoral arterial sheath under fluoroscopic guidance into the aortic root.  A temporary transvenous pacemaker catheter was passed through the right femoral venous sheath under fluoroscopic guidance into the right ventricle.  The pacemaker was tested to ensure stable lead placement and pacemaker capture. Aortic root angiography was performed in order to determine the optimal angiographic angle for valve deployment.   TRANSFEMORAL  ACCESS:   A left femoral arterial cutdown was performed by Dr Cyndia Bent. Please see his separate operative note for details. The patient was heparinized systemically and ACT verified > 250 seconds.    A 14 Fr transfemoral E-sheath was introduced into the left femoral artery after progressively dilating over an Amplatz superstiff wire. An AL2 catheter was used to direct a straight-tip exchange length wire across the native aortic valve into the left ventricle. I was unable to cross with a pigtail catheter. A long JR4 catheter was used to cross the aortic valve over the straight wire. I then replaced this wire with the Amplatz Extra-stiff wire in the LV apex. At that point, BAV was performed using a 20 mm valvuloplasty balloon as I could not cross the valve with the 23 mm balloon.  Once optimal position was achieved, BAV was done under rapid ventricular pacing at 180 bpm. The patient recovered well hemodynamically.   TRANSCATHETER HEART VALVE DEPLOYMENT:  An Edwards Sapien 3 THV (size 81mm) was prepared and crimped per manufacturer's guidelines, and the proper orientation of the valve is confirmed on the [employer] delivery system. The valve was advanced through the introducer sheath using normal technique until in an appropriate position in the abdominal aorta beyond the sheath tip. The balloon was then retracted and using the fine-tuning wheel was centered on the valve. The valve was then advanced across the aortic arch using appropriate flexion of the catheter. The valve was carefully positioned across the aortic valve annulus. The Commander catheter was retracted using normal technique. Once final position of the valve has been confirmed by angiographic assessment, the valve is deployed while temporarily holding ventilation and during rapid  ventricular pacing to maintain systolic blood pressure < 50 mmHg and pulse pressure < 10 mmHg. The balloon inflation is held for >3 seconds after reaching full  deployment volume. Once the balloon has fully deflated the balloon is retracted into the ascending aorta and valve function is assessed using TEE. There is felt to be trivial paravalvular leak and no central aortic insufficiency.  The patient's hemodynamic recovery following valve deployment is good.  The deployment balloon and guidewire are both removed. Echo demostrated acceptable post-procedural gradients, stable mitral valve function, and trivial AI.    PROCEDURE COMPLETION:  The sheath was then removed and arteriotomy repaired by Dr Cyndia Bent. Please see his separate report for details. Distal abdominal aortography was performed to evaluate for any arterial injury related to the procedure. There was no evidence of dissection, perforation, or other vascular injury in the abdominal aorta, iliac artery, or femoral artery.  Protamine was administered once femoral arterial repair was complete. The temporary pacemaker, pigtail catheters and femoral sheaths were removed with manual pressure used for hemostasis.   The patient tolerated the procedure well and is transported to the surgical intensive care in stable condition. There were no immediate intraoperative complications. All sponge instrument and needle counts are verified correct at completion of the operation.   No blood products were administered during the operation.  The patient received a total of 116 mL of intravenous contrast during the procedure.  Lowell Mcgurk MD 06/10/2015 3:14 PM

## 2015-06-10 NOTE — Anesthesia Preprocedure Evaluation (Addendum)
Anesthesia Evaluation  Patient identified by MRN, date of birth, ID band Patient awake    Reviewed: Allergy & Precautions, NPO status , Patient's Chart, lab work & pertinent test results, reviewed documented beta blocker date and time   Airway Mallampati: I  TM Distance: >3 FB Neck ROM: Full    Dental  (+) Partial Upper, Partial Lower, Dental Advisory Given   Pulmonary sleep apnea and Continuous Positive Airway Pressure Ventilation , former smoker,     + decreased breath sounds      Cardiovascular hypertension, + Peripheral Vascular Disease   Rhythm:Regular Rate:Normal + Systolic murmurs    Neuro/Psych  Neuromuscular disease    GI/Hepatic   Endo/Other    Renal/GU      Musculoskeletal   Abdominal (+) + obese,   Peds  Hematology   Anesthesia Other Findings   Reproductive/Obstetrics                           Anesthesia Physical Anesthesia Plan  ASA: III  Anesthesia Plan: General   Post-op Pain Management:    Induction: Intravenous  Airway Management Planned: Oral ETT  Additional Equipment: Arterial line, PA Cath, 3D TEE, CVP and Ultrasound Guidance Line Placement  Intra-op Plan:   Post-operative Plan:   Informed Consent: I have reviewed the patients History and Physical, chart, labs and discussed the procedure including the risks, benefits and alternatives for the proposed anesthesia with the patient or authorized representative who has indicated his/her understanding and acceptance.   Dental advisory given  Plan Discussed with: CRNA and Anesthesiologist  Anesthesia Plan Comments:         Anesthesia Quick Evaluation

## 2015-06-10 NOTE — Transfer of Care (Signed)
Immediate Anesthesia Transfer of Care Note  Patient: [name]  Procedure(s) Performed: Procedure(s): TRANSCATHETER AORTIC VALVE REPLACEMENT, TRANSFEMORAL (Left) TRANSESOPHAGEAL ECHOCARDIOGRAM (TEE) (N/A)  Patient Location: ICU  Anesthesia Type:General  Level of Consciousness: Patient remains intubated per anesthesia plan  Airway & Oxygen Therapy: Patient remains intubated per anesthesia plan and Patient placed on Ventilator (see vital sign flow sheet for setting)  Post-op Assessment: Report given to RN  Post vital signs: Reviewed and stable  Last Vitals:  Filed Vitals:   06/10/15 1033  BP: 101/44  Pulse: 78  Temp: 36.9 C  Resp: 20    Complications: No apparent anesthesia complications

## 2015-06-10 NOTE — Procedures (Signed)
Extubation Procedure Note  Patient Details:   Name: ARLESS MOSCHELLA DOB: Mar 04, 1941 MRN: [id number]   Airway Documentation:  Airway 8.5 mm (Active)  Secured at (cm) 23 cm 06/10/2015  7:21 PM  Measured From Lips 06/10/2015  7:21 PM  Secured Location Right 06/10/2015  7:21 PM  Secured By Pink Tape 06/10/2015  7:21 PM  Cuff Pressure (cm H2O) 26 cm H2O 06/10/2015  7:21 PM  Site Condition Cool;Dry 06/10/2015  7:21 PM    Evaluation  O2 sats: stable throughout Complications: No apparent complications Patient did tolerate procedure well. Bilateral Breath Sounds: Clear, Diminished Suctioning: Oral, Airway Yes, pt able to cough and clear secretions as well as vocalize name. Placed pt on nasal cannula and tolerating well at this time. Nif consistently -40 with a VC between 8 and 9L  Flemon Kelty E 06/10/2015, 10:21 PM

## 2015-06-10 NOTE — Anesthesia Postprocedure Evaluation (Signed)
  Anesthesia Post-op Note  Patient: [name]  Procedure(s) Performed: Procedure(s): TRANSCATHETER AORTIC VALVE REPLACEMENT, TRANSFEMORAL (Left) TRANSESOPHAGEAL ECHOCARDIOGRAM (TEE) (N/A)  Patient Location: SICU  Anesthesia Type:General  Level of Consciousness: sedated and Patient remains intubated per anesthesia plan  Airway and Oxygen Therapy: Patient remains intubated per anesthesia plan and Patient placed on Ventilator (see vital sign flow sheet for setting)  Post-op Pain: none  Post-op Assessment: Post-op Vital signs reviewed, Patient's Cardiovascular Status Stable, Respiratory Function Stable, Patent Airway and Pain level controlled              Post-op Vital Signs: stable  Last Vitals:  Filed Vitals:   06/10/15 1600  BP:   Pulse:   Temp: 36.5 C  Resp: 18    Complications: No apparent anesthesia complications

## 2015-06-10 NOTE — Interval H&P Note (Signed)
History and Physical Interval Note:  06/10/2015 10:26 AM  [name]  has presented today for surgery (TAVR), with the diagnosis of SEVERE AS.   The various methods of treatment have been discussed with the patient and family. After consideration of risks, benefits and other options for treatment, the patient has consented to  Procedure(s): TRANSCATHETER AORTIC VALVE REPLACEMENT, TRANSFEMORAL (N/A) TRANSESOPHAGEAL ECHOCARDIOGRAM (TEE) (N/A) as a surgical intervention .  The patient's history has been reviewed, patient examined, no change in status, stable for surgery.  I have reviewed the patient's chart and labs.  Questions were answered to the patient's satisfaction.     MCALHANY,CHRISTOPHER

## 2015-06-10 NOTE — Progress Notes (Signed)
*  PRELIMINARY RESULTS* Echocardiogram Echocardiogram Transesophageal has been performed.  Leavy Cella 06/10/2015, 1:54 PM

## 2015-06-10 NOTE — Progress Notes (Signed)
Pt is s/p TAVR this afternoon from transfemoral approach. He has been bradycardic with HR 38-35 bpm with what appears to be sinus brady with PACs on tele. BP is stable. Dopamine drip started and HR now in the 50s. He is still intubated. Precedex is being weaned. Pacing pads in place if he becomes hemodynamically unstable.   MCALHANY,CHRISTOPHER 06/10/2015 6:09 PM

## 2015-06-10 NOTE — Progress Notes (Signed)
Dr.McAlhaney notified of pt unable to provide urine sample

## 2015-06-10 NOTE — Progress Notes (Signed)
Changed settings per rapid wean protocol, pt tolerating well at this time

## 2015-06-10 NOTE — Progress Notes (Signed)
Changed settings per rapid wean protocol

## 2015-06-10 NOTE — Op Note (Signed)
CARDIOTHORACIC SURGERY OPERATIVE NOTE  Date of Procedure:  06/10/2015  Preoperative Diagnosis: Severe Aortic Stenosis and aortic insufficiency  Postoperative Diagnosis: Same   Procedure:    Transcatheter Aortic Valve Replacement - Left Transfemoral Approach  Edwards Sapien 3 Transcatheter Heart Valve (size 26 mm, model # [id number], serial # [id number])   Co-Surgeons:  Gaye Pollack, MD and Lauree Chandler, MD  Assistants:   Valentina Gu. Roxy Manns, MD  Anesthesiologist:  Dr. Roberts Gaudy  Echocardiographer:  Dr. Ena Dawley  Pre-operative Echo Findings:   severe aortic stenosis   severe aortic insufficiency  normal left ventricular systolic function   Post-operative Echo Findings:  trivial paravalvular leak  normal left ventricular systolic function      DETAILS OF THE OPERATIVE PROCEDURE  The majority of the procedure is documented separately in a procedure note by Dr. Lauree Chandler.   TRANSFEMORAL ACCESS:   A small incision is made in the left groin immediately over the common femoral artery. The subcutaneous tissues are divided with electrocautery and the anterior surface of the superficial femoral artery is identified. Due to his morbid obesity and large abdomen the common femoral artery was lying quite high and we would have to go through his lower abdominal wall to expose that. The SFA appeared to be of adequate size and was free of disease. Sharp dissection is utilized to free up the artery proximally and distally.  A pair of CV-4 Gore-tex sutures are place as diamond-shaped purse-strings on the anterior surface of the femoral artery.  The patient is heparinized systemically and ACT verified > 250 seconds.  The superficial femoral artery is punctured using an  18 gauge needle and a soft J-tipped guidewire is passed into the common iliac artery under fluoroscopic guidance.  A 6 Fr straight diagnostic catheter is placed over the guidewire and the guidewire  is removed.  An Amplatz super stiff guidewire is passed through the sheath into the descending thoracic aorta and the introducing diagnostic catheter is removed.  Serial dilators are passed over the guidewire under continuous fluoroscopic guidance, making certain that each dilator passes easily all of the way into the distal abdominal aorta.  A 14 Fr Commander introducer sheath is passed over the guidewire into the abdominal aorta.  The introducing dilator is removed, the sheath is flushed with heparinized saline, and the sheath is secured to the skin.    FEMORAL SHEATH REMOVAL AND ARTERIAL CLOSURE:  After the completion of successful valve deployment as documented separately by Dr. Angelena Form, the femoral artery sheath is removed and the arteriotomy is closed using the previously placed Gore-tex purse-string sutures. Once the repair has been completed protamine was administered to reverse the anticoagulation. A digitally-subtracted arteriogram is obtained from just above the aortic bifurcation to below the arteriotomy to confirm the integrity of the vascular repair.  The incision is irrigated with saline solution and subsequently closed in multiple layers using absorbable suture.  The skin incision is closed using a subcuticular skin closure.     Gaye Pollack MD 06/10/2015

## 2015-06-10 NOTE — Significant Event (Signed)
Bradycardic resolved after starting dopamine @ 28mcg with increasing BP in the 160s. Dopamine reduced, increase nitroglycerin drip to help with BP control. BP better after reducing dopamine @ 68mcg, BP 130-140s, HR 70s, sinus rhythm. Will continue to monitor. Pearlene Teat, Therapist, sports.

## 2015-06-11 ENCOUNTER — Inpatient Hospital Stay (HOSPITAL_COMMUNITY): Payer: Medicare Other

## 2015-06-11 ENCOUNTER — Encounter (HOSPITAL_COMMUNITY): Payer: Self-pay | Admitting: Cardiovascular Disease

## 2015-06-11 DIAGNOSIS — Z954 Presence of other heart-valve replacement: Secondary | ICD-10-CM

## 2015-06-11 DIAGNOSIS — I350 Nonrheumatic aortic (valve) stenosis: Secondary | ICD-10-CM

## 2015-06-11 DIAGNOSIS — Z952 Presence of prosthetic heart valve: Secondary | ICD-10-CM

## 2015-06-11 LAB — GLUCOSE, CAPILLARY
Glucose-Capillary: 100 mg/dL — ABNORMAL HIGH (ref 65–99)
Glucose-Capillary: 102 mg/dL — ABNORMAL HIGH (ref 65–99)
Glucose-Capillary: 111 mg/dL — ABNORMAL HIGH (ref 65–99)
Glucose-Capillary: 117 mg/dL — ABNORMAL HIGH (ref 65–99)
Glucose-Capillary: 121 mg/dL — ABNORMAL HIGH (ref 65–99)
Glucose-Capillary: 150 mg/dL — ABNORMAL HIGH (ref 65–99)
Glucose-Capillary: 95 mg/dL (ref 65–99)

## 2015-06-11 LAB — BASIC METABOLIC PANEL
Anion gap: 4 — ABNORMAL LOW (ref 5–15)
BUN: 17 mg/dL (ref 6–20)
CO2: 29 mmol/L (ref 22–32)
Calcium: 8.3 mg/dL — ABNORMAL LOW (ref 8.9–10.3)
Chloride: 104 mmol/L (ref 101–111)
Creatinine, Ser: 0.98 mg/dL (ref 0.61–1.24)
GFR calc Af Amer: >60 mL/min (ref >60)
GFR calc non Af Amer: >60 mL/min (ref >60)
Glucose, Bld: 103 mg/dL — ABNORMAL HIGH (ref 65–99)
Potassium: 4.6 mmol/L (ref 3.5–5.1)
Sodium: 137 mmol/L (ref 135–145)

## 2015-06-11 LAB — POCT I-STAT 3, ART BLOOD GAS (G3+)
Acid-Base Excess: 1 mmol/L (ref 0.0–2.0)
Acid-Base Excess: 3 mmol/L — ABNORMAL HIGH (ref 0.0–2.0)
Bicarbonate: 27.3 mEq/L — ABNORMAL HIGH (ref 20.0–24.0)
Bicarbonate: 29.2 mEq/L — ABNORMAL HIGH (ref 20.0–24.0)
O2 Saturation: 97 %
O2 Saturation: 98 %
Patient temperature: 36.8
Patient temperature: 37.2
TCO2: 29 mmol/L (ref 0–100)
TCO2: 31 mmol/L (ref 0–100)
pCO2 arterial: 49.3 mmHg — ABNORMAL HIGH (ref 35.0–45.0)
pCO2 arterial: 51.1 mmHg — ABNORMAL HIGH (ref 35.0–45.0)
pH, Arterial: 7.35 (ref 7.350–7.450)
pH, Arterial: 7.366 (ref 7.350–7.450)
pO2, Arterial: 109 mmHg — ABNORMAL HIGH (ref 80.0–100.0)
pO2, Arterial: 98 mmHg (ref 80.0–100.0)

## 2015-06-11 LAB — CBC
HCT: 32.7 % — ABNORMAL LOW (ref 39.0–52.0)
Hemoglobin: 10.3 g/dL — ABNORMAL LOW (ref 13.0–17.0)
MCH: 28.9 pg (ref 26.0–34.0)
MCHC: 31.5 g/dL (ref 30.0–36.0)
MCV: 91.9 fL (ref 78.0–100.0)
Platelets: 108 10*3/uL — ABNORMAL LOW (ref 150–400)
RBC: 3.56 MIL/uL — ABNORMAL LOW (ref 4.22–5.81)
RDW: 16 % — ABNORMAL HIGH (ref 11.5–15.5)
WBC: 7.2 10*3/uL (ref 4.0–10.5)

## 2015-06-11 LAB — MAGNESIUM: Magnesium: 2.2 mg/dL (ref 1.7–2.4)

## 2015-06-11 MED ORDER — INSULIN ASPART 100 UNIT/ML ~~LOC~~ SOLN
0.0000 [IU] | SUBCUTANEOUS | Status: DC
  Administered 2015-06-11: 2 [IU] via SUBCUTANEOUS

## 2015-06-11 MED ORDER — INFLUENZA VAC SPLIT QUAD 0.5 ML IM SUSY: 0.5000 mL | PREFILLED_SYRINGE | INTRAMUSCULAR | Status: DC | PRN

## 2015-06-11 MED ORDER — CHLORHEXIDINE GLUCONATE CLOTH 2 % EX PADS
6.0000 | MEDICATED_PAD | Freq: Every day | CUTANEOUS | Status: DC
  Administered 2015-06-12: 6 via TOPICAL

## 2015-06-11 MED ORDER — MUPIROCIN 2 % EX OINT
TOPICAL_OINTMENT | Freq: Two times a day (BID) | CUTANEOUS | Status: DC
Start: 2015-06-11 — End: 2015-06-13
  Administered 2015-06-11 – 2015-06-13 (×5): via NASAL
  Filled 2015-06-11 (×3): qty 22

## 2015-06-11 NOTE — Addendum Note (Signed)
Addendum  created 06/11/15 0745 by Roberts Gaudy, MD   Modules edited: Notes Section   Notes Section:  File: [id number]; Pend: [id number]; Pend: [id number]; Raelyn Number: [id number]; Raelyn Number: [id number]; Pend: [id number]

## 2015-06-11 NOTE — Progress Notes (Signed)
Anesthesiology Follow-up:  Awake and alert, neuro intact in good spirits. Hemodynamically stable in SR on dopamine overnight. Dopamine now d/ced.  VS: T- 37.5 BP- 109/47 HR 79 (SR)  RR-24 O2 Sat 99% on 4L [place] PA 46/24 CO/CI 10.5/3.7  Na- 137 K-4.6 BUN/Cr. 17/0.98 glucose 117 H/H 10.3/32.7 Platelets- 108,000  PT/INR 16.5/1.32   Extubated 6 hours post-op. Doing well POD#1 following TAVR, no apparent complications.  Roberts Gaudy

## 2015-06-11 NOTE — Progress Notes (Signed)
     SUBJECTIVE: No chest pain or SOB. Only c/o sore throat.   Tele: sinus, 1st degree av block   BP 110/52 mmHg  Pulse 81  Temp(Src) 99.7 F (37.6 C) (Core (Comment))  Resp 17  Ht 6\' 2"  (1.88 m)  Wt 381 lb (172.82 kg)  BMI 48.90 kg/m2  SpO2 99%  Intake/Output Summary (Last 24 hours) at 06/11/15 [id number] Last data filed at 06/11/15 0600  Gross per 24 hour  Intake 2669.66 ml  Output   2455 ml  Net 214.66 ml    PHYSICAL EXAM General: Well developed, well nourished, in no acute distress. Alert and oriented x 3.  Psych:  Good affect, responds appropriately Neck: No JVD. No masses noted.  Lungs: Clear bilaterally with no wheezes or rhonci noted.  Heart: RRR with no murmurs noted. Abdomen: Bowel sounds are present. Soft, non-tender.  Extremities: No lower extremity edema.   LABS: Basic Metabolic Panel:  Recent Labs  06/10/15 2145 06/10/15 2148 06/11/15 0410  NA  --  136 137  K  --  4.4 4.6  CL  --  110 104  CO2  --   --  29  GLUCOSE  --  121* 103*  BUN  --  21* 17  CREATININE 0.98 1.00 0.98  CALCIUM  --   --  8.3*  MG 2.4  --  2.2   CBC:  Recent Labs  06/10/15 2145 06/10/15 2148 06/11/15 0410  WBC 8.2  --  7.2  HGB 10.7* 11.2* 10.3*  HCT 33.4* 33.0* 32.7*  MCV 92.0  --  91.9  PLT 110*  --  108*   Current Meds: . acetaminophen  1,000 mg Oral 4 times per day   Or  . acetaminophen (TYLENOL) oral liquid 160 mg/5 mL  1,000 mg Per Tube 4 times per day  . allopurinol  100 mg Oral Daily  . antiseptic oral rinse  7 mL Mouth Rinse QID  . aspirin EC  325 mg Oral Daily   Or  . aspirin  324 mg Per Tube Daily  . atorvastatin  40 mg Oral q1800  . cefUROXime (ZINACEF)  IV  1.5 g Intravenous Q12H  . chlorhexidine gluconate  15 mL Mouth Rinse BID  . clopidogrel  75 mg Oral Q breakfast  . furosemide  20 mg Oral Daily  . gabapentin  300 mg Oral BID  . Gerhardt's butt cream   Topical Daily  . insulin aspart  0-24 Units Subcutaneous 6 times per day  . insulin  regular  0-10 Units Intravenous TID WC  . lisinopril  10 mg Oral Daily  . [START ON 06/12/2015] pantoprazole  40 mg Oral Daily  . polyethylene glycol  17 g Oral Daily  . senna  1 tablet Oral Daily    ASSESSMENT AND PLAN:  1. Severe aortic valve stenosis: POD #1 s/p TAVR. Doing well this am. HR now in the 80s. Will start ASA and Plavix today. Echo later today to assess bioprosthetic valve.   2. Post-op anemia: will follow  3. Post op bradycardia: Sinus in 80s now.   D/c lines. Stop NTG and dopamine (low dose). Out of bed. If stable later this am then transfer to 2W.     MCALHANY,CHRISTOPHER  9/21/20166:42 AM

## 2015-06-11 NOTE — Clinical Documentation Improvement (Signed)
Cardiology  Abnormal Lab/Test Results:  *9/20 ABG results include pCO2 values of 51.1, 49.3, 47.8*  Possible Clinical Conditions associated with below indicators  *Acute hypercapnic respiratory failure  *Post procedure hypercapnic respiratory failure  *No clinical significance  Other Condition  Cannot Clinically Determine  Supporting Information: *Post extubation respiratory rates have ranged from 14-27 breaths/minute  Treatment Provided: *Two of the three ABGs were obtained while patient was intubated, in 50% FiO2 *After extubation, vital signs flow sheet indicate he has primarily been maintained on 4 lpm [place]  Please exercise your independent, professional judgment when responding. A specific answer is not anticipated or expected.   Thank You,  Franky Macho, RN [place] [phone]

## 2015-06-11 NOTE — Progress Notes (Signed)
1 Day Post-Op Procedure(s) (LRB): TRANSCATHETER AORTIC VALVE REPLACEMENT, TRANSFEMORAL (Left) TRANSESOPHAGEAL ECHOCARDIOGRAM (TEE) (N/A) Subjective:  No complaints  Objective: Vital signs in last 24 hours: Temp:  [92.8 F (33.8 C)-99.9 F (37.7 C)] 99 F (37.2 C) (09/21 0800) Pulse Rate:  [39-87] 71 (09/21 0800) Cardiac Rhythm:  [-] Heart block (09/21 0800) Resp:  [12-39] 20 (09/21 0800) BP: (82-119)/(37-65) 102/43 mmHg (09/21 0800) SpO2:  [86 %-100 %] 100 % (09/21 0800) Arterial Line BP: (99-195)/(37-84) 135/46 mmHg (09/21 0800) FiO2 (%):  [40 %-50 %] 40 % (09/20 2144) Weight:  [172.82 kg (381 lb)] 172.82 kg (381 lb) (09/20 1033)  Hemodynamic parameters for last 24 hours: PAP: (34-88)/(11-48) 53/24 mmHg CO:  [3 L/min-10.5 L/min] 10.5 L/min CI:  [1.7 L/min/m2-8.7 L/min/m2] 3.7 L/min/m2  Intake/Output from previous day: 09/20 0701 - 09/21 0700 In: 2675.9 [I.V.:2165.9; NG/GT:60; IV Piggyback:450] Out: 2455 [Urine:2455] Intake/Output this shift: Total I/O In: 50 [IV Piggyback:50] Out: 275 [Urine:275]  General appearance: alert and cooperative Neurologic: intact Heart: regular rate and rhythm, S1, S2 normal, no murmur, click, rub or gallop Lungs: clear to auscultation bilaterally Extremities: edema mild in legs Wound: left groin incision ok  Lab Results:  Recent Labs  06/10/15 2145 06/10/15 2148 06/11/15 0410  WBC 8.2  --  7.2  HGB 10.7* 11.2* 10.3*  HCT 33.4* 33.0* 32.7*  PLT 110*  --  108*   BMET:   Recent Labs  06/10/15 2148 06/11/15 0410  NA 136 137  K 4.4 4.6  CL 110 104  CO2  --  29  GLUCOSE 121* 103*  BUN 21* 17  CREATININE 1.00 0.98  CALCIUM  --  8.3*    PT/INR:  Recent Labs  06/10/15 1610  LABPROT 16.5*  INR 1.32   ABG    Component Value Date/Time   PHART 7.366 06/10/2015 2358   HCO3 29.2* 06/10/2015 2358   TCO2 31 06/10/2015 2358   O2SAT 98.0 06/10/2015 2358   CBG (last 3)   Recent Labs  06/10/15 2145 06/10/15 2251  06/10/15 2355  GLUCAP 118* 121* 111*   CXR: mild bibasilar atelectasis.  Assessment/Plan: S/P Procedure(s) (LRB): TRANSCATHETER AORTIC VALVE REPLACEMENT, TRANSFEMORAL (Left) TRANSESOPHAGEAL ECHOCARDIOGRAM (TEE) (N/A)  Hemodynamically stable in sinus rhythm  Keep left groin incision covered with gauze to prevent skin on skin with panniculus which will lead to wound infection and breakdown. Discussed with patient and wife.  Plan echo today and probably transfer to 2W.   LOS: 1 day    Gaye Pollack 06/11/2015

## 2015-06-11 NOTE — Progress Notes (Signed)
  Echocardiogram 2D Echocardiogram has been performed.  [name] 06/11/2015, 11:35 AM

## 2015-06-12 ENCOUNTER — Other Ambulatory Visit: Payer: Self-pay | Admitting: *Deleted

## 2015-06-12 DIAGNOSIS — I350 Nonrheumatic aortic (valve) stenosis: Secondary | ICD-10-CM

## 2015-06-12 LAB — GLUCOSE, CAPILLARY
Glucose-Capillary: 94 mg/dL (ref 65–99)
Glucose-Capillary: 97 mg/dL (ref 65–99)

## 2015-06-12 LAB — CBC
HCT: 33.6 % — ABNORMAL LOW (ref 39.0–52.0)
Hemoglobin: 10.5 g/dL — ABNORMAL LOW (ref 13.0–17.0)
MCH: 28.8 pg (ref 26.0–34.0)
MCHC: 31.3 g/dL (ref 30.0–36.0)
MCV: 92.3 fL (ref 78.0–100.0)
Platelets: 108 10*3/uL — ABNORMAL LOW (ref 150–400)
RBC: 3.64 MIL/uL — ABNORMAL LOW (ref 4.22–5.81)
RDW: 16.1 % — ABNORMAL HIGH (ref 11.5–15.5)
WBC: 6.7 10*3/uL (ref 4.0–10.5)

## 2015-06-12 LAB — BASIC METABOLIC PANEL
Anion gap: 3 — ABNORMAL LOW (ref 5–15)
BUN: 16 mg/dL (ref 6–20)
CO2: 31 mmol/L (ref 22–32)
Calcium: 8.7 mg/dL — ABNORMAL LOW (ref 8.9–10.3)
Chloride: 103 mmol/L (ref 101–111)
Creatinine, Ser: 0.95 mg/dL (ref 0.61–1.24)
GFR calc Af Amer: >60 mL/min (ref >60)
GFR calc non Af Amer: >60 mL/min (ref >60)
Glucose, Bld: 98 mg/dL (ref 65–99)
Potassium: 4.6 mmol/L (ref 3.5–5.1)
Sodium: 137 mmol/L (ref 135–145)

## 2015-06-12 MED ORDER — CHLORHEXIDINE GLUCONATE 0.12 % MT SOLN
OROMUCOSAL | Status: AC
  Filled 2015-06-12: qty 15

## 2015-06-12 MED ORDER — ENOXAPARIN SODIUM 40 MG/0.4ML ~~LOC~~ SOLN
40.0000 mg | SUBCUTANEOUS | Status: DC
  Administered 2015-06-12 – 2015-06-13 (×2): 40 mg via SUBCUTANEOUS
  Filled 2015-06-12 (×2): qty 0.4

## 2015-06-12 MED FILL — Magnesium Sulfate Inj 50%: INTRAMUSCULAR | Qty: 10 | Status: AC

## 2015-06-12 MED FILL — Potassium Chloride Inj 2 mEq/ML: INTRAVENOUS | Qty: 40 | Status: AC

## 2015-06-12 MED FILL — Heparin Sodium (Porcine) Inj 1000 Unit/ML: INTRAMUSCULAR | Qty: 30 | Status: AC

## 2015-06-12 NOTE — Evaluation (Signed)
Physical Therapy Evaluation Patient Details Name: [name] MRN: [id number] DOB: [birth year] Today's Date: 06/12/2015   History of Present Illness  Pt is a 74 y/o male who presents s/p TAVR procedure on 06/10/15.  Clinical Impression  Pt admitted with above diagnosis. Pt currently with functional limitations due to the deficits listed below (see PT Problem List). At the time of PT eval pt was able to perform transfers and ambulation with mod assist to get to EOB and min guard assist for ambulation. It appears that pt has equipment set-up at home to make him successful at a mod I level for ADL's and mobility. Anticipate that pt is close to baseline and he will not require further therapy at d/c. Pt will benefit from skilled PT to increase their independence and safety with mobility to allow discharge to the venue listed below.       Follow Up Recommendations No PT follow up;Supervision for mobility/OOB    Equipment Recommendations  None recommended by PT    Recommendations for Other Services       Precautions / Restrictions Precautions Precautions: Fall Restrictions Weight Bearing Restrictions: No      Mobility  Bed Mobility Overal bed mobility: Needs Assistance;+ 2 for safety/equipment Bed Mobility: Supine to Sit     Supine to sit: Mod assist;+2 for safety/equipment     General bed mobility comments: +2 for safety with the air bed. Pt unable to transition to sitting with the mattress deflated due to border around it. With mattrress inflated pt able to get to EOB but +2 was utilized for safety.   Transfers Overall transfer level: Needs assistance Equipment used: Bilateral platform walker Transfers: Sit to/from Stand Sit to Stand: Min guard         General transfer comment: Close guard for safety. Pt was able to power-up to full standing position with bed elevated slightly.   Ambulation/Gait Ambulation/Gait assistance: Min guard Ambulation Distance (Feet): 200  Feet Assistive device: Bilateral platform walker Gait Pattern/deviations: Step-through pattern;Decreased stride length;Trunk flexed Gait velocity: Decreased Gait velocity interpretation: Below normal speed for age/gender General Gait Details: Hands-on guarding for safety however pt did not require physical assistance. Moving fairly slowly due to reported concern for falling as he did not have his shoes on (hospital socks donned)  Stairs            Wheelchair Mobility    Modified Rankin (Stroke Patients Only)       Balance Overall balance assessment: Needs assistance Sitting-balance support: Feet supported;No upper extremity supported Sitting balance-Leahy Scale: Fair     Standing balance support: Bilateral upper extremity supported Standing balance-Leahy Scale: Poor Standing balance comment: Requires UE support                             Pertinent Vitals/Pain Pain Assessment: No/denies pain    Home Living Family/patient expects to be discharged to:: Private residence Living Arrangements: Spouse/significant other Available Help at Discharge: Family Type of Home: House Home Access: Stairs to enter   [employer] of Steps: 3 (Deep enough to put whole walker on) Home Layout: One level Home Equipment: Walker - 2 wheels;Wheelchair - Education administrator (comment);Shower seat (Lift chair)      Prior Function Level of Independence: Independent with assistive device(s)         Comments: Pt states he sits on the toilet to bathe and hasn't gotten in his shower for 4 years. Lift chair at  home     Hand Dominance   Dominant Hand: Right    Extremity/Trunk Assessment   Upper Extremity Assessment: Defer to OT evaluation (B Wrist pain when using RW - has platforms)           Lower Extremity Assessment: Generalized weakness (Likely close to baseline)      Cervical / Trunk Assessment: Other exceptions  Communication   Communication: No  difficulties  Cognition Arousal/Alertness: Awake/alert Behavior During Therapy: WFL for tasks assessed/performed Overall Cognitive Status: Within Functional Limits for tasks assessed                      General Comments      Exercises        Assessment/Plan    PT Assessment Patient needs continued PT services  PT Diagnosis Difficulty walking;Generalized weakness   PT Problem List Decreased strength;Decreased range of motion;Decreased activity tolerance;Decreased balance;Decreased mobility;Decreased knowledge of use of DME;Decreased safety awareness;Decreased knowledge of precautions  PT Treatment Interventions DME instruction;Gait training;Stair training;Functional mobility training;Therapeutic activities;Therapeutic exercise;Neuromuscular re-education;Patient/family education   PT Goals (Current goals can be found in the Care Plan section) Acute Rehab PT Goals Patient Stated Goal: Home tomorrow PT Goal Formulation: With patient Time For Goal Achievement: 06/19/15 Potential to Achieve Goals: Good    Frequency Min 3X/week   Barriers to discharge        Co-evaluation               End of Session Equipment Utilized During Treatment: Gait belt Activity Tolerance: Patient tolerated treatment well Patient left: in chair;with call bell/phone within reach Nurse Communication: Mobility status         Time: 1536-1610 PT Time Calculation (min) (ACUTE ONLY): 34 min   Charges:   PT Evaluation $Initial PT Evaluation Tier I: 1 Procedure PT Treatments $Gait Training: 8-22 mins   PT G Codes:        Rolinda Roan June 17, 2015, 4:30 PM   Rolinda Roan, PT, DPT Acute Rehabilitation Services Pager: [phone]

## 2015-06-12 NOTE — Progress Notes (Signed)
SUBJECTIVE: No chest pain or SOB. Was up to chair yesterday.   BP 142/68 mmHg  Pulse 72  Temp(Src) 98.5 F (36.9 C) (Oral)  Resp 19  Ht 6\' 2"  (1.88 m)  Wt 398 lb 9.5 oz (180.8 kg)  BMI 51.15 kg/m2  SpO2 91%  Intake/Output Summary (Last 24 hours) at 06/12/15 [id number] Last data filed at 06/12/15 0500  Gross per 24 hour  Intake  616.2 ml  Output   1575 ml  Net -958.8 ml    PHYSICAL EXAM General: Well developed, well nourished, in no acute distress. Alert and oriented x 3.  Psych:  Good affect, responds appropriately Neck: No JVD. No masses noted.  Lungs: Clear bilaterally with no wheezes or rhonci noted.  Heart: RRR with soft diastolic murmur noted. Abdomen: Bowel sounds are present. Soft, non-tender.  Extremities: No lower extremity edema.   LABS: Basic Metabolic Panel:  Recent Labs  06/10/15 2145  06/11/15 0410 06/12/15 0415  NA  --   < > 137 137  K  --   < > 4.6 4.6  CL  --   < > 104 103  CO2  --   --  29 31  GLUCOSE  --   < > 103* 98  BUN  --   < > 17 16  CREATININE 0.98  < > 0.98 0.95  CALCIUM  --   --  8.3* 8.7*  MG 2.4  --  2.2  --   < > = values in this interval not displayed. CBC:  Recent Labs  06/11/15 0410 06/12/15 0415  WBC 7.2 6.7  HGB 10.3* 10.5*  HCT 32.7* 33.6*  MCV 91.9 92.3  PLT 108* 108*   Current Meds: . acetaminophen  1,000 mg Oral 4 times per day   Or  . acetaminophen (TYLENOL) oral liquid 160 mg/5 mL  1,000 mg Per Tube 4 times per day  . allopurinol  100 mg Oral Daily  . antiseptic oral rinse  7 mL Mouth Rinse QID  . aspirin EC  325 mg Oral Daily   Or  . aspirin  324 mg Per Tube Daily  . atorvastatin  40 mg Oral q1800  . cefUROXime (ZINACEF)  IV  1.5 g Intravenous Q12H  . chlorhexidine gluconate  15 mL Mouth Rinse BID  . Chlorhexidine Gluconate Cloth  6 each Topical Q0600  . clopidogrel  75 mg Oral Q breakfast  . furosemide  20 mg Oral Daily  . gabapentin  300 mg Oral BID  . Gerhardt's butt cream   Topical Daily  .  insulin aspart  0-24 Units Subcutaneous 6 times per day  . lisinopril  10 mg Oral Daily  . mupirocin ointment   Nasal BID  . pantoprazole  40 mg Oral Daily  . polyethylene glycol  17 g Oral Daily  . senna  1 tablet Oral Daily   Echo 06/11/15: Left ventricle: The cavity size was normal. Wall thickness was increased in a pattern of severe LVH. Systolic function was vigorous. The estimated ejection fraction was in the range of 65% to 70%. Wall motion was normal; there were no regional wall motion abnormalities. Features are consistent with a pseudonormal left ventricular filling pattern, with concomitant abnormal relaxation and increased filling pressure (grade 2 diastolic dysfunction). - Aortic valve: Valve area (VTI): 2.33 cm^2. Valve area (Vmax): 2.47 cm^2. Valve area (Vmean): 2.64 cm^2. - Mitral valve: Moderately to severely calcified annulus. Severely thickened, severely calcified leaflets . The  findings are consistent with severe stenosis. Valve area by pressure half-time: 1.88 cm^2. Valve area by continuity equation (using LVOT flow): 1.58 cm^2. - Left atrium: The atrium was severely dilated. - Right atrium: The atrium was moderately dilated.  ASSESSMENT AND PLAN:  1. Severe aortic valve stenosis: POD #2 s/p TAVR. Doing well this am. HR now in the 80s. Will start ASA and Plavix today. Echo 06/11/15 with normally functioning bioprosthetic aortic valve.    2. Post-op anemia: Stable.   Transfer to [facility] today.     MCALHANY,CHRISTOPHER  9/22/20166:48 AM

## 2015-06-12 NOTE — Progress Notes (Addendum)
[facility] DAILY ICU PROGRESS NOTE                   [address]            [place] [address]          [phone]   2 Days Post-Op Procedure(s) (LRB): TRANSCATHETER AORTIC VALVE REPLACEMENT, TRANSFEMORAL (Left) TRANSESOPHAGEAL ECHOCARDIOGRAM (TEE) (N/A)  Total Length of Stay:  LOS: 2 days   Subjective: Feels well, no complaints.   Objective: Vital signs in last 24 hours: Temp:  [97.9 F (36.6 C)-99.3 F (37.4 C)] 98.5 F (36.9 C) (09/22 0400) Pulse Rate:  [64-91] 68 (09/22 0700) Cardiac Rhythm:  [-] Normal sinus rhythm (09/22 0730) Resp:  [15-34] 22 (09/22 0700) BP: (100-142)/(43-68) 142/68 mmHg (09/22 0400) SpO2:  [91 %-100 %] 96 % (09/22 0700) Arterial Line BP: (135)/(46) 135/46 mmHg (09/21 0800) Weight:  [398 lb 9.5 oz (180.8 kg)-399 lb (180.985 kg)] 398 lb 9.5 oz (180.8 kg) (09/22 0500)  Filed Weights   06/10/15 1033 06/11/15 1600 06/12/15 0500  Weight: 381 lb (172.82 kg) 399 lb (180.985 kg) 398 lb 9.5 oz (180.8 kg)    Weight change: 18 lb (8.165 kg)   Hemodynamic parameters for last 24 hours: PAP: (53-56)/(20-24) 56/20 mmHg  Intake/Output from previous day: 09/21 0701 - 09/22 0700 In: 620 [P.O.:300; I.V.:220; IV Piggyback:100] Out: 1575 [Urine:1575]  Intake/Output this shift:    Current Meds: Scheduled Meds: . acetaminophen  1,000 mg Oral 4 times per day   Or  . acetaminophen (TYLENOL) oral liquid 160 mg/5 mL  1,000 mg Per Tube 4 times per day  . allopurinol  100 mg Oral Daily  . antiseptic oral rinse  7 mL Mouth Rinse QID  . aspirin EC  325 mg Oral Daily   Or  . aspirin  324 mg Per Tube Daily  . atorvastatin  40 mg Oral q1800  . cefUROXime (ZINACEF)  IV  1.5 g Intravenous Q12H  . chlorhexidine gluconate  15 mL Mouth Rinse BID  . Chlorhexidine Gluconate Cloth  6 each Topical Q0600  . clopidogrel  75 mg Oral Q breakfast  . furosemide  20 mg Oral Daily  . gabapentin  300 mg Oral BID  . Gerhardt's butt cream   Topical Daily  .  lisinopril  10 mg Oral Daily  . mupirocin ointment   Nasal BID  . pantoprazole  40 mg Oral Daily  . polyethylene glycol  17 g Oral Daily  . senna  1 tablet Oral Daily   Continuous Infusions:  PRN Meds:.Influenza vac split quadrivalent PF, metoprolol, morphine injection, ondansetron (ZOFRAN) IV, oxyCODONE, traMADol  Physical Exam: General appearance: alert, cooperative and no distress Heart: regular rate and rhythm Lungs: clear to auscultation bilaterally Extremities: Mild LE edema Wound: Groin wound with dressing in place, no drainage   Lab Results: CBC: Recent Labs  06/11/15 0410 06/12/15 0415  WBC 7.2 6.7  HGB 10.3* 10.5*  HCT 32.7* 33.6*  PLT 108* 108*   BMET:  Recent Labs  06/11/15 0410 06/12/15 0415  NA 137 137  K 4.6 4.6  CL 104 103  CO2 29 31  GLUCOSE 103* 98  BUN 17 16  CREATININE 0.98 0.95  CALCIUM 8.3* 8.7*    PT/INR:  Recent Labs  06/10/15 1610  LABPROT 16.5*  INR 1.32   Radiology: No results found.  Echo: Study Conclusions  - Left ventricle: The cavity size was normal. Wall thickness was increased in a  pattern of severe LVH. Systolic function was vigorous. The estimated ejection fraction was in the range of 65% to 70%. Wall motion was normal; there were no regional wall motion abnormalities. Features are consistent with a pseudonormal left ventricular filling pattern, with concomitant abnormal relaxation and increased filling pressure (grade 2 diastolic dysfunction). - Aortic valve: Valve area (VTI): 2.33 cm^2. Valve area (Vmax): 2.47 cm^2. Valve area (Vmean): 2.64 cm^2. - Mitral valve: Moderately to severely calcified annulus. Severely thickened, severely calcified leaflets . The findings are consistent with severe stenosis. Valve area by pressure half-time: 1.88 cm^2. Valve area by continuity equation (using LVOT flow): 1.58 cm^2. - Left atrium: The atrium was severely dilated. - Right atrium: The atrium was  moderately dilated.   Assessment/Plan: S/P Procedure(s) (LRB): TRANSCATHETER AORTIC VALVE REPLACEMENT, TRANSFEMORAL (Left) TRANSESOPHAGEAL ECHOCARDIOGRAM (TEE) (N/A) Echo as noted above. Needs to mobilize today. Tx to 2[facility] per cardiology, possibly home in am if remains stable.   COLLINS,GINA H 06/12/2015 7:41 AM   Chart reviewed, patient examined, agree with above. Echo looks ok. Left groin incision ok.  He needs to ambulate today and then should be able to go home tomorrow.

## 2015-06-13 ENCOUNTER — Encounter (HOSPITAL_COMMUNITY): Payer: Self-pay | Admitting: Physician Assistant

## 2015-06-13 LAB — BASIC METABOLIC PANEL
Anion gap: 3 — ABNORMAL LOW (ref 5–15)
BUN: 14 mg/dL (ref 6–20)
CO2: 31 mmol/L (ref 22–32)
Calcium: 8.7 mg/dL — ABNORMAL LOW (ref 8.9–10.3)
Chloride: 102 mmol/L (ref 101–111)
Creatinine, Ser: 1 mg/dL (ref 0.61–1.24)
GFR calc Af Amer: >60 mL/min (ref >60)
GFR calc non Af Amer: >60 mL/min (ref >60)
Glucose, Bld: 96 mg/dL (ref 65–99)
Potassium: 4.5 mmol/L (ref 3.5–5.1)
Sodium: 136 mmol/L (ref 135–145)

## 2015-06-13 LAB — TYPE AND SCREEN
ABO/RH(D): O POS
Antibody Screen: NEGATIVE
Unit division: 0
Unit division: 0

## 2015-06-13 LAB — CBC
HCT: 34.2 % — ABNORMAL LOW (ref 39.0–52.0)
Hemoglobin: 10.6 g/dL — ABNORMAL LOW (ref 13.0–17.0)
MCH: 28.6 pg (ref 26.0–34.0)
MCHC: 31 g/dL (ref 30.0–36.0)
MCV: 92.4 fL (ref 78.0–100.0)
Platelets: 126 10*3/uL — ABNORMAL LOW (ref 150–400)
RBC: 3.7 MIL/uL — ABNORMAL LOW (ref 4.22–5.81)
RDW: 16.1 % — ABNORMAL HIGH (ref 11.5–15.5)
WBC: 7.2 10*3/uL (ref 4.0–10.5)

## 2015-06-13 LAB — GLUCOSE, CAPILLARY
Glucose-Capillary: 101 mg/dL — ABNORMAL HIGH (ref 65–99)
Glucose-Capillary: 108 mg/dL — ABNORMAL HIGH (ref 65–99)
Glucose-Capillary: 112 mg/dL — ABNORMAL HIGH (ref 65–99)
Glucose-Capillary: 117 mg/dL — ABNORMAL HIGH (ref 65–99)
Glucose-Capillary: 93 mg/dL (ref 65–99)

## 2015-06-13 MED ORDER — CLOPIDOGREL BISULFATE 75 MG PO TABS: 75.0000 mg | ORAL_TABLET | Freq: Every day | ORAL | Status: DC

## 2015-06-13 MED ORDER — ASPIRIN 81 MG PO TABS: 81.0000 mg | ORAL_TABLET | Freq: Every day | ORAL | Status: DC

## 2015-06-13 NOTE — Progress Notes (Signed)
Physical Therapy Treatment Patient Details Name: [name] MRN: [id number] DOB: Jan 12, 1941 Today's Date: 06/13/2015    History of Present Illness Pt is a 74 y/o male who presents s/p TAVR procedure on 06/10/15.    PT Comments    Pt progressing towards physical therapy goals. Was able to ambulate fairly well with bilateral platform RW and tennis shoes donned. Pt and wife were educated on general safety and energy conservation at home. Pt and wife both agree that the house is well set up for pt to mobilize and they have no further questions or concerns. Pt left sitting up in the bari chair and anticipates d/c home today. Will continue to follow.   Follow Up Recommendations  No PT follow up;Supervision for mobility/OOB     Equipment Recommendations  None recommended by PT    Recommendations for Other Services       Precautions / Restrictions Precautions Precautions: Fall Restrictions Weight Bearing Restrictions: No    Mobility  Bed Mobility Overal bed mobility: Needs Assistance;+ 2 for safety/equipment Bed Mobility: Supine to Sit     Supine to sit: Mod assist;+2 for safety/equipment     General bed mobility comments: Wife present for safety and guarding while therapist assisted pt to sitting and controlled the bed. Pt reaching for HHA to pull to sitting.   Transfers Overall transfer level: Needs assistance Equipment used: Bilateral platform walker Transfers: Sit to/from Stand Sit to Stand: Min guard         General transfer comment: Close guard for safety. Pt was able to power-up to full standing position with bed elevated slightly.   Ambulation/Gait Ambulation/Gait assistance: Min guard Ambulation Distance (Feet): 200 Feet Assistive device: Bilateral platform walker Gait Pattern/deviations: Step-through pattern;Decreased stride length;Trunk flexed Gait velocity: Decreased Gait velocity interpretation: Below normal speed for age/gender General Gait Details:  Hands-on guarding for safety however pt did not require physical assistance. Moving fairly slowly due to reported concern for falling again today. Pt had tennis shoes donned however was still fearful due to shiny/slick appearance of the floor.    Stairs            Wheelchair Mobility    Modified Rankin (Stroke Patients Only)       Balance Overall balance assessment: Needs assistance Sitting-balance support: Feet supported Sitting balance-Leahy Scale: Fair     Standing balance support: Bilateral upper extremity supported Standing balance-Leahy Scale: Poor Standing balance comment: Requires UE support                    Cognition Arousal/Alertness: Awake/alert Behavior During Therapy: WFL for tasks assessed/performed Overall Cognitive Status: Within Functional Limits for tasks assessed                      Exercises      General Comments        Pertinent Vitals/Pain Pain Assessment: No/denies pain    Home Living                      Prior Function            PT Goals (current goals can now be found in the care plan section) Acute Rehab PT Goals Patient Stated Goal: Home tomorrow PT Goal Formulation: With patient Time For Goal Achievement: 06/19/15 Potential to Achieve Goals: Good    Frequency  Min 3X/week    PT Plan Current plan remains appropriate    Co-evaluation  End of Session Equipment Utilized During Treatment: Gait belt Activity Tolerance: Patient tolerated treatment well Patient left: in chair;with call bell/phone within reach     Time: 0906-0938 PT Time Calculation (min) (ACUTE ONLY): 32 min  Charges:  $Gait Training: 8-22 mins $Therapeutic Activity: 8-22 mins                    G Codes:      Rolinda Roan 06/30/15, 12:55 PM   Rolinda Roan, PT, DPT Acute Rehabilitation Services Pager: [phone]

## 2015-06-13 NOTE — Care Management Important Message (Signed)
Important Message  Patient Details  Name: [name] MRN: [id number] Date of Birth: 1941-06-07   Medicare Important Message Given:  Yes-second notification given    Loann Quill 06/13/2015, 10:41 AM

## 2015-06-13 NOTE — Progress Notes (Signed)
SUBJECTIVE: No chest pain or SOB  BP 132/57 mmHg  Pulse 81  Temp(Src) 98.2 F (36.8 C) (Oral)  Resp 18  Ht 6\' 2"  (1.88 m)  Wt 398 lb 9.5 oz (180.8 kg)  BMI 51.15 kg/m2  SpO2 97%  Intake/Output Summary (Last 24 hours) at 06/13/15 1004 Last data filed at 06/13/15 0600  Gross per 24 hour  Intake      0 ml  Output   1060 ml  Net  -1060 ml    PHYSICAL EXAM General: Obese white male, in no acute distress. Alert and oriented x 3.  Psych:  Good affect, responds appropriately Neck: No masses noted.  Lungs: Clear bilaterally with no wheezes or rhonci noted.  Heart: RRR with no murmurs noted. Abdomen: Bowel sounds are present. Soft, non-tender.  Extremities: Trace to 1+ bilateral lower extremity edema, chronic venous stasis changes.   LABS: Basic Metabolic Panel:  Recent Labs  06/10/15 2145  06/11/15 0410 06/12/15 0415 06/13/15 0553  NA  --   < > 137 137 136  K  --   < > 4.6 4.6 4.5  CL  --   < > 104 103 102  CO2  --   < > 29 31 31   GLUCOSE  --   < > 103* 98 96  BUN  --   < > 17 16 14   CREATININE 0.98  < > 0.98 0.95 1.00  CALCIUM  --   < > 8.3* 8.7* 8.7*  MG 2.4  --  2.2  --   --   < > = values in this interval not displayed. CBC:  Recent Labs  06/12/15 0415 06/13/15 0553  WBC 6.7 7.2  HGB 10.5* 10.6*  HCT 33.6* 34.2*  MCV 92.3 92.4  PLT 108* 126*   Current Meds: . acetaminophen  1,000 mg Oral 4 times per day   Or  . acetaminophen (TYLENOL) oral liquid 160 mg/5 mL  1,000 mg Per Tube 4 times per day  . allopurinol  100 mg Oral Daily  . aspirin EC  325 mg Oral Daily   Or  . aspirin  324 mg Per Tube Daily  . atorvastatin  40 mg Oral q1800  . Chlorhexidine Gluconate Cloth  6 each Topical Q0600  . clopidogrel  75 mg Oral Q breakfast  . enoxaparin (LOVENOX) injection  40 mg Subcutaneous Q24H  . furosemide  20 mg Oral Daily  . gabapentin  300 mg Oral BID  . Gerhardt's butt cream   Topical Daily  . lisinopril  10 mg Oral Daily  . mupirocin ointment    Nasal BID  . pantoprazole  40 mg Oral Daily  . polyethylene glycol  17 g Oral Daily  . senna  1 tablet Oral Daily   Echo 06/11/15: Left ventricle: The cavity size was normal. Wall thickness was increased in a pattern of severe LVH. Systolic function was vigorous. The estimated ejection fraction was in the range of 65% to 70%. Wall motion was normal; there were no regional wall motion abnormalities. Features are consistent with a pseudonormal left ventricular filling pattern, with concomitant abnormal relaxation and increased filling pressure (grade 2 diastolic dysfunction). - Aortic valve: Valve area (VTI): 2.33 cm^2. Valve area (Vmax): 2.47 cm^2. Valve area (Vmean): 2.64 cm^2. - Mitral valve: Moderately to severely calcified annulus. Severely thickened, severely calcified leaflets . The findings are consistent with severe stenosis. Valve area by pressure half-time: 1.88 cm^2. Valve area by continuity equation (using  LVOT flow): 1.58 cm^2. - Left atrium: The atrium was severely dilated. - Right atrium: The atrium was moderately dilated.  ASSESSMENT AND PLAN:  1. Severe aortic valve stenosis: POD #3 s/p TAVR. Doing well this am. HR now in the 80s. Will continue ASA and Plavix. Echo 06/11/15 with normally functioning bioprosthetic aortic valve.   2. Post-op anemia: Stable.   Plan to discharge home today. He will need follow up in the CT surgery office with Dr. Cyndia Bent in 1-2 weeks for wound check (I will route this to the CT surgery office for scheduling). He will need one month f/u in my office (has to be with me) and echo day of visit with me in one month.     MCALHANY,CHRISTOPHER  9/23/201610:04 AM

## 2015-06-13 NOTE — Discharge Summary (Signed)
Discharge Summary   Patient ID: [name],  MRN: [id number], DOB/AGE: 10-01-1940 74 y.o.  Admit date: 06/10/2015 Discharge date: 06/13/2015  Primary Care Provider: Penni Homans Primary Cardiologist: Dr. Ala Bent: Dr. Angelena Form  Discharge Diagnoses Active Problems:   Severe aortic valve stenosis   Allergies Allergies  Allergen Reactions  . Coumadin [Warfarin Sodium]     States he can't be on this-bleeds out    Procedures 06/10/15  Transcatheter Aortic Valve Replacement - Transfemoral Approach Edwards Sapien 3 THV (size 26 mm, model #[id number] , serial [id number] )  History of Present Illness  74 yo male with history of morbid obesity, mild HTN, HLD, gout and severe aortic stenosis. He is confined to a wheelchair most of the time and has episodes of gout very often. He does walk short distances around the house. He has foot drop following his knee surgery in 2008. He has had many issues with his right knee following his surgery. He has a prior cervical spine surgery with diagnosis of spinal cord tumor. He believes that he has neurofibromatosis and his sons have multiple tumors consistent with this as well. He has been followed for aortic valve stenosis for many years. Echo in 2011 with severe AS and moderate AI. He has had no change in his symptoms over the last few years. He has baseline SOB but no real change. No chest pain. He weight is stable at 380 lbs. He was told in 2011 that he was not a candidate for AVR given his obesity. He was recently seen by Dr. [name] and given his severe AS, he would like to explore the option of TAVR. TEE 03/07/15 with normal LV function, severe AS with mean gradient of 50 mmHg and peak gradient of 100 mmHg. There was also severe AI. Cardiac cath with normal coronary arteries. CTA chest/abd/pelvis with suitable arterial access for TAVR from the transfemoral approach.   During the course of the patient's preoperative work up he have  been evaluated comprehensively by a multidisciplinary team of specialists coordinated through the [facility] in the [facility].  Hospital Course  The patient was presented 06/10/15 for scheduled TVAR. S/p successful Edwards Sapien 3 Transcatheter Heart Valve (size 26 mm, model # [id number], serial # [id number]). Post TVAR pt had bradycardic with HR 38-35 bpm with what appears to be sinus brady with PACs on tele. BP was stable. Dopamine drip started and HR improved to 80s and drip stopped. ASA and plavix was added next day of procedure. Echo 06/11/15 with normally functioning bioprosthetic aortic valve.The patient recovered well as expected.   She has been seen by Dr. Angelena Form today and deemed ready for discharge home. All follow-up appointments have been scheduled. Discharge medications are listed below.    Discharge Vitals Blood pressure 132/57, pulse 81, temperature 98.2 F (36.8 C), temperature source Oral, resp. rate 18, height 6\' 2"  (1.88 m), weight 398 lb 9.5 oz (180.8 kg), SpO2 97 %.  Filed Weights   06/10/15 1033 06/11/15 1600 06/12/15 0500  Weight: 381 lb (172.82 kg) 399 lb (180.985 kg) 398 lb 9.5 oz (180.8 kg)    Labs  CBC  Recent Labs  06/12/15 0415 06/13/15 0553  WBC 6.7 7.2  HGB 10.5* 10.6*  HCT 33.6* 34.2*  MCV 92.3 92.4  PLT 108* 123XX123*   Basic Metabolic Panel  Recent Labs  06/10/15 2145  06/11/15 0410 06/12/15 0415 06/13/15 0553  NA  --   < >  137 137 136  K  --   < > 4.6 4.6 4.5  CL  --   < > 104 103 102  CO2  --   < > 29 31 31   GLUCOSE  --   < > 103* 98 96  BUN  --   < > 17 16 14   CREATININE 0.98  < > 0.98 0.95 1.00  CALCIUM  --   < > 8.3* 8.7* 8.7*  MG 2.4  --  2.2  --   --   < > = values in this interval not displayed. Liver Function Tests  Disposition  Pt is being discharged home today in good condition.  Follow-up Plans & Appointments  Follow-up Information    Follow up with Gaye Pollack,  MD On 07/02/2015.   Specialty:  Cardiothoracic Surgery   Why:  Appointment with Dr. Cyndia Bent at 2:00 for wound check   Contact information:   [address] [place] [address] [phone]       Follow up with [facility] ECHO [facility] On 07/10/2015.   Specialty:  Cardiology   Why:  Echocardiogram is at 9:30   Contact information:   [address] [id number] [place] [phone]      Follow up with Lauree Chandler, MD On 07/10/2015.   Specialty:  Cardiology   Why:  Appointment is at 10:30   Contact information:   [place] [place] [address] [phone]           Discharge Instructions    Diet - low sodium heart healthy    Complete by:  As directed      Increase activity slowly    Complete by:  As directed            Discharge Medications    Medication List    TAKE these medications        acetaminophen 500 MG tablet  Commonly known as:  TYLENOL  Take 500 mg by mouth 2 (two) times daily.     allopurinol 100 MG tablet  Commonly known as:  ZYLOPRIM  Take 1 tablet (100 mg total) by mouth 2 (two) times daily.     aspirin 81 MG tablet  Take 1 tablet (81 mg total) by mouth daily.  Start taking on:  06/14/2015     atorvastatin 40 MG tablet  Commonly known as:  LIPITOR  TAKE 1 BY MOUTH DAILY     cholecalciferol 1000 UNITS tablet  Commonly known as:  VITAMIN D  Take 2,000 Units by mouth daily.     clopidogrel 75 MG tablet  Commonly known as:  PLAVIX  Take 1 tablet (75 mg total) by mouth daily with breakfast.  Start taking on:  06/14/2015     CoQ10 200 MG Caps  Take 200 mg by mouth daily.     furosemide 20 MG tablet  Commonly known as:  LASIX  Take 1 tablet (20 mg total) by mouth daily.     gabapentin 300 MG capsule  Commonly known as:  NEURONTIN  TAKE 1 BY MOUTH 3 TIMES DAILY     lisinopril 10 MG tablet  Commonly known as:  PRINIVIL,ZESTRIL  TAKE 1 BY MOUTH  DAILY     loratadine 10 MG tablet  Commonly known as:  CLARITIN  Take 1 tablet (10 mg total) by mouth daily.     multivitamin with minerals Tabs tablet  Take 1 tablet by  mouth daily.     MUSCLE RUB 10-15 % Crea  Apply 1 application topically as needed for muscle pain.  Notes to Patient:  Take as directed      polyethylene glycol packet  Commonly known as:  MIRALAX / GLYCOLAX  Take 17 g by mouth daily.  Notes to Patient:  Hold if having loose stools     senna 8.6 MG Tabs tablet  Commonly known as:  SENOKOT  Take 1 tablet by mouth daily.  Notes to Patient:  Hold if having loose stools     sennosides-docusate sodium 8.6-50 MG tablet  Commonly known as:  SENOKOT-S  Take 1 tablet by mouth 2 (two) times daily.  Notes to Patient:  Hold for loose stools     traMADol 50 MG tablet  Commonly known as:  ULTRAM  TAKE 1 TABLET BY MOUTH EVERY 6 HOURS AS NEEDED        Duration of Discharge Encounter   Greater than 30 minutes including physician time.  Signed, Leanor Kail PA-C 06/13/2015, 12:37 PM

## 2015-06-13 NOTE — Progress Notes (Signed)
       [address]       [place] [address]             [phone]          3 Days Post-Op Procedure(s) (LRB): TRANSCATHETER AORTIC VALVE REPLACEMENT, TRANSFEMORAL (Left) TRANSESOPHAGEAL ECHOCARDIOGRAM (TEE) (N/A)  Subjective: Feels well, hopeful to go home today.   Objective: Vital signs in last 24 hours: Patient Vitals for the past 24 hrs:  BP Temp Temp src Pulse Resp SpO2  06/13/15 0606 (!) 132/57 mmHg 98.2 F (36.8 C) Oral 81 18 97 %  06/12/15 2000 121/61 mmHg 98.2 F (36.8 C) Oral 63 17 97 %  06/12/15 1657 (!) 110/58 mmHg 98.5 F (36.9 C) Oral 70 16 97 %  06/12/15 1618 - 98.4 F (36.9 C) Oral - - -  06/12/15 1600 (!) 149/58 mmHg - - 74 - -  06/12/15 1500 - - - 73 (!) 28 97 %  06/12/15 1400 - - - 85 20 93 %  06/12/15 1300 - - - 86 12 99 %  06/12/15 1200 131/61 mmHg - - 86 13 96 %  06/12/15 1151 - 98 F (36.7 C) Oral - - -  06/12/15 1100 - - - 77 13 97 %  06/12/15 1000 - - - 84 (!) 23 97 %  06/12/15 0900 - - - 70 (!) 24 96 %   Current Weight  06/12/15 398 lb 9.5 oz (180.8 kg)     Intake/Output from previous day: 09/22 0701 - 09/23 0700 In: 170 [P.O.:120; IV Piggyback:50] Out: 1300 [Urine:1300]    PHYSICAL EXAM:  Heart: RRR, no murmur Lungs: Clear Wound: Groin wound clean and dry Extremities: Mild edema    Lab Results: CBC: Recent Labs  06/12/15 0415 06/13/15 0553  WBC 6.7 7.2  HGB 10.5* 10.6*  HCT 33.6* 34.2*  PLT 108* 126*   BMET:  Recent Labs  06/12/15 0415 06/13/15 0553  NA 137 136  K 4.6 4.5  CL 103 102  CO2 31 31  GLUCOSE 98 96  BUN 16 14  CREATININE 0.95 1.00  CALCIUM 8.7* 8.7*    PT/INR:  Recent Labs  06/10/15 1610  LABPROT 16.5*  INR 1.32      Assessment/Plan: S/P Procedure(s) (LRB): TRANSCATHETER AORTIC VALVE REPLACEMENT, TRANSFEMORAL (Left) TRANSESOPHAGEAL ECHOCARDIOGRAM (TEE) (N/A) Groin stable.  Pt walking in halls with assistance.   Hopefully he can go home today.  Will arrange  outpatient followup.   LOS: 3 days    COLLINS,GINA H 06/13/2015

## 2015-06-17 ENCOUNTER — Encounter (HOSPITAL_COMMUNITY): Payer: Self-pay | Admitting: Cardiovascular Disease

## 2015-06-17 ENCOUNTER — Telehealth: Payer: Self-pay

## 2015-06-17 NOTE — Telephone Encounter (Addendum)
Message    May have a hospital follow up visit if he would like    ----- Message -----    From: Burnell Blanks, MD    Sent: 06/13/2015  2:12 PM     To: [name], MD   Spoke with patient who states that things have been going well since his discharge.  Currently he does not have any questions or concerns.  He does not want to follow up with PCP at this time, but says that he would come in if he has to.  At this time, he says he would like to go with the appointments that have already been set.  He was encouraged to call with any questions or concerns.  He was also told that if he feels he needs to be seen for any reason, we would schedule him to be seen.  He was very appreciative for the call.    Message routed to Dr. Charlett Blake for [place].

## 2015-06-24 ENCOUNTER — Encounter (HOSPITAL_COMMUNITY): Payer: Self-pay | Admitting: Cardiovascular Disease

## 2015-07-02 ENCOUNTER — Ambulatory Visit (INDEPENDENT_AMBULATORY_CARE_PROVIDER_SITE_OTHER): Payer: Medicare Other | Admitting: Surgery

## 2015-07-02 ENCOUNTER — Encounter: Payer: Self-pay | Admitting: Surgery

## 2015-07-02 VITALS — BP 106/67 | HR 69 | Resp 16 | Ht 74.0 in

## 2015-07-02 DIAGNOSIS — Z954 Presence of other heart-valve replacement: Secondary | ICD-10-CM

## 2015-07-02 DIAGNOSIS — Z953 Presence of xenogenic heart valve: Secondary | ICD-10-CM

## 2015-07-02 DIAGNOSIS — I350 Nonrheumatic aortic (valve) stenosis: Secondary | ICD-10-CM | POA: Diagnosis not present

## 2015-07-02 NOTE — Progress Notes (Signed)
     HPI:  Patient returns for routine postoperative follow-up having undergone left transfemoral TAVR  on 06/10/2015. The patient's early postoperative recovery while in the hospital was notable for an uncomplicated postop course. Since hospital discharge the patient reports that he feels much better. He has been walking as much as he can with his orthopedic disability and morbid obesity and has noticed that his breathing and stamina are better.   Current Outpatient Prescriptions  Medication Sig Dispense Refill  . acetaminophen (TYLENOL) 500 MG tablet Take 500 mg by mouth 2 (two) times daily.     [name] allopurinol (ZYLOPRIM) 100 MG tablet Take 1 tablet (100 mg total) by mouth 2 (two) times daily. (Patient taking differently: Take 100 mg by mouth daily. ) 60 tablet 2  . aspirin 81 MG tablet Take 1 tablet (81 mg total) by mouth daily. 30 tablet 11  . atorvastatin (LIPITOR) 40 MG tablet TAKE 1 BY MOUTH DAILY (Patient taking differently: Take 40 mg by mouth daily at 6 PM. TAKE 1 BY MOUTH DAILY) 90 tablet 2  . cholecalciferol (VITAMIN D) 1000 UNITS tablet Take 2,000 Units by mouth daily.     . clopidogrel (PLAVIX) 75 MG tablet Take 1 tablet (75 mg total) by mouth daily with breakfast. 30 tablet 11  . Coenzyme Q10 (COQ10) 200 MG CAPS Take 200 mg by mouth daily.    . furosemide (LASIX) 20 MG tablet Take 1 tablet (20 mg total) by mouth daily. 30 tablet 11  . gabapentin (NEURONTIN) 300 MG capsule TAKE 1 BY MOUTH 3 TIMES DAILY (Patient taking differently: Take 300 mg by mouth 2 (two) times daily. ) 270 capsule 1  . lisinopril (PRINIVIL,ZESTRIL) 10 MG tablet TAKE 1 BY MOUTH DAILY (Patient taking differently: Take 10 mg by mouth daily. TAKE 1 BY MOUTH DAILY) 90 tablet 2  . loratadine (CLARITIN) 10 MG tablet Take 1 tablet (10 mg total) by mouth daily. 30 tablet 0  . Menthol-Methyl Salicylate (MUSCLE RUB) 10-15 % CREA Apply 1 application topically as needed for muscle pain.    . Multiple Vitamin (MULTIVITAMIN  WITH MINERALS) TABS Take 1 tablet by mouth daily.    . polyethylene glycol (MIRALAX / GLYCOLAX) packet Take 17 g by mouth daily.    [name] senna (SENOKOT) 8.6 MG TABS tablet Take 1 tablet by mouth daily.    . sennosides-docusate sodium (SENOKOT-S) 8.6-50 MG tablet Take 1 tablet by mouth 2 (two) times daily.    . traMADol (ULTRAM) 50 MG tablet TAKE 1 TABLET BY MOUTH EVERY 6 HOURS AS NEEDED (Patient taking differently: TAKE 1 TABLET BY MOUTH EVERY 6 HOURS AS NEEDED FOR PAIN) 90 tablet 0   No current facility-administered medications for this visit.    Physical Exam: BP 106/67 mmHg  Pulse 69  Resp 16  Ht 6\' 2"  (1.88 m)  SpO2 96% He looks well. Lung exam is clear. Cardiac exam shows a regular rate and rhythm with normal heart sounds. There is no murmur The left groin incision is healing well.   Diagnostic Tests:  None today  Impression:  He is making a good recovery from his surgery and is symptomatically much better. I told him that he could return to normal activity as tolerated.  Plan:  He has a follow up appt with Dr. Angelena Form in a couple weeks and will have an echo at that time.    Gaye Pollack, MD [facility] [phone]

## 2015-07-05 ENCOUNTER — Other Ambulatory Visit: Payer: Self-pay | Admitting: Family Medicine

## 2015-07-07 NOTE — Telephone Encounter (Signed)
Faxed hardcopy to [facility].

## 2015-07-10 ENCOUNTER — Other Ambulatory Visit (HOSPITAL_COMMUNITY): Payer: Medicare Other

## 2015-07-10 ENCOUNTER — Ambulatory Visit: Payer: Medicare Other | Admitting: Cardiovascular Disease

## 2015-07-17 ENCOUNTER — Ambulatory Visit (INDEPENDENT_AMBULATORY_CARE_PROVIDER_SITE_OTHER): Payer: Medicare Other | Admitting: Cardiovascular Disease

## 2015-07-17 ENCOUNTER — Encounter: Payer: Self-pay | Admitting: Cardiovascular Disease

## 2015-07-17 ENCOUNTER — Ambulatory Visit (HOSPITAL_COMMUNITY): Payer: Medicare Other | Attending: Cardiovascular Disease

## 2015-07-17 VITALS — BP 115/80 | HR 65 | Resp 12 | Ht 74.0 in | Wt 351.0 lb

## 2015-07-17 DIAGNOSIS — I350 Nonrheumatic aortic (valve) stenosis: Secondary | ICD-10-CM | POA: Insufficient documentation

## 2015-07-17 DIAGNOSIS — I059 Rheumatic mitral valve disease, unspecified: Secondary | ICD-10-CM | POA: Diagnosis not present

## 2015-07-17 DIAGNOSIS — Z87891 Personal history of nicotine dependence: Secondary | ICD-10-CM | POA: Insufficient documentation

## 2015-07-17 DIAGNOSIS — Z952 Presence of prosthetic heart valve: Secondary | ICD-10-CM | POA: Insufficient documentation

## 2015-07-17 DIAGNOSIS — Z954 Presence of other heart-valve replacement: Secondary | ICD-10-CM | POA: Diagnosis not present

## 2015-07-17 DIAGNOSIS — Z953 Presence of xenogenic heart valve: Secondary | ICD-10-CM

## 2015-07-17 NOTE — Progress Notes (Signed)
Chief Complaint  Patient presents with  . Follow-up    post TAVR f/u     History of Present Illness: 74 yo male with history of morbid obesity, mild HTN, HLD, gout and severe aortic stenosis who is here today for TAVR one month follow up. His cardiac issues are followed by Dr. [name]. He underwent TAVR on 06/10/15. He did well following the procedure. There were no complications. He tells me today that he is feeling better. His breathing has improved. He has no pain.He is still limited by chronic joint issues.   Primary Care Physician: Penni Homans  Past Medical History  Diagnosis Date  . Valvular heart disease   . Aortic stenosis   . Osteoarthritis   . Anemia   . OSA (obstructive sleep apnea)   . Bursitis   . Gout     takes Allopurinol daily  . Arthritis   . Hyperlipidemia     takes Atorvastatin daily  . DDD (degenerative disc disease)   . Peroneal palsy     significant right foot drop  . History of shingles   . Constipation     takes Miralax daily as well as Senokot daily  . Peripheral edema     takes Furosemide daily  . Hypertension     takes Lisinopril daily  . Complication of anesthesia     Halucinations  . History of blood clots 1962    knee  . Pneumonia 25+yrs ago    hx of  . Peripheral neuropathy (HCC)     takes Gabapentin daily  . History of blood transfusion     no abnormal reaction noted  . Urinary urgency   . Urinary frequency   . Cataract     left immature  . Aortic stenosis, severe     S/p Edwards Sapien 3 Transcatheter Heart Valve (size 26 mm, model # [id number], serial # [id number])    Past Surgical History  Procedure Laterality Date  . Total knee arthroplasty      right  . Spine surgery    . Laminotomy  1193    c6-t2  . Hernia repair      umbilical hernia  . Knee surgery Right     multiple knee surgeries due to complication of R TKA  . Colonoscopy N/A 02/07/2013    Procedure: COLONOSCOPY;  Surgeon: Juanita Craver, MD;  Location: [facility]  ENDOSCOPY;  Service: Endoscopy;  Laterality: N/A;  . Esophagogastroduodenoscopy N/A 02/07/2013    Procedure: ESOPHAGOGASTRODUODENOSCOPY (EGD);  Surgeon: Juanita Craver, MD;  Location: [facility] ENDOSCOPY;  Service: Endoscopy;  Laterality: N/A;  . Colonoscopy N/A 02/09/2013    Procedure: COLONOSCOPY;  Surgeon: Beryle Beams, MD;  Location: [place];  Service: Endoscopy;  Laterality: N/A;  . Givens capsule study N/A 02/09/2013    Procedure: GIVENS CAPSULE STUDY;  Surgeon: Beryle Beams, MD;  Location: [place];  Service: Endoscopy;  Laterality: N/A;  . Tee without cardioversion N/A 03/07/2015    Procedure: TRANSESOPHAGEAL ECHOCARDIOGRAM (TEE);  Surgeon: Josue Hector, MD;  Location: [facility] ENDOSCOPY;  Service: Cardiovascular;  Laterality: N/A;  ANES TO BRING PROPOFOL PER DOCTOR  . Cardiac catheterization N/A 05/02/2015    Procedure: Right/Left Heart Cath and Coronary Angiography;  Surgeon: Burnell Blanks, MD;  Location: [place] CV LAB;  Service: Cardiovascular;  Laterality: N/A;  . Transcatheter aortic valve replacement, transfemoral Left 06/10/2015    Procedure: TRANSCATHETER AORTIC VALVE REPLACEMENT, TRANSFEMORAL;  Surgeon: Burnell Blanks, MD;  Location: [place];  Service: Open Heart Surgery;  Laterality: Left;  . Tee without cardioversion N/A 06/10/2015    Procedure: TRANSESOPHAGEAL ECHOCARDIOGRAM (TEE);  Surgeon: Burnell Blanks, MD;  Location: [place];  Service: Open Heart Surgery;  Laterality: N/A;    Current Outpatient Prescriptions  Medication Sig Dispense Refill  . acetaminophen (TYLENOL) 500 MG tablet Take 500 mg by mouth 2 (two) times daily.     [name] allopurinol (ZYLOPRIM) 100 MG tablet Take 1 tablet (100 mg total) by mouth 2 (two) times daily. (Patient taking differently: Take 100 mg by mouth daily. ) 60 tablet 2  . aspirin 81 MG tablet Take 1 tablet (81 mg total) by mouth daily. 30 tablet 11  . atorvastatin (LIPITOR) 40 MG tablet TAKE 1 BY MOUTH DAILY (Patient taking  differently: Take 40 mg by mouth daily at 6 PM. TAKE 1 BY MOUTH DAILY) 90 tablet 2  . cholecalciferol (VITAMIN D) 1000 UNITS tablet Take 2,000 Units by mouth daily.     . clopidogrel (PLAVIX) 75 MG tablet Take 1 tablet (75 mg total) by mouth daily with breakfast. 30 tablet 11  . Coenzyme Q10 (COQ10) 200 MG CAPS Take 200 mg by mouth daily.    . furosemide (LASIX) 20 MG tablet Take 1 tablet (20 mg total) by mouth daily. 30 tablet 11  . gabapentin (NEURONTIN) 300 MG capsule TAKE 1 BY MOUTH 3 TIMES DAILY (Patient taking differently: Take 300 mg by mouth 2 (two) times daily. ) 270 capsule 1  . lisinopril (PRINIVIL,ZESTRIL) 10 MG tablet TAKE 1 BY MOUTH DAILY (Patient taking differently: Take 10 mg by mouth daily. TAKE 1 BY MOUTH DAILY) 90 tablet 2  . loratadine (CLARITIN) 10 MG tablet Take 1 tablet (10 mg total) by mouth daily. 30 tablet 0  . Menthol-Methyl Salicylate (MUSCLE RUB) 10-15 % CREA Apply 1 application topically as needed for muscle pain.    . Multiple Vitamin (MULTIVITAMIN WITH MINERALS) TABS Take 1 tablet by mouth daily.    . polyethylene glycol (MIRALAX / GLYCOLAX) packet Take 17 g by mouth daily.    [name] senna (SENOKOT) 8.6 MG TABS tablet Take 1 tablet by mouth daily.    . sennosides-docusate sodium (SENOKOT-S) 8.6-50 MG tablet Take 1 tablet by mouth 2 (two) times daily.    . traMADol (ULTRAM) 50 MG tablet TAKE 1 TABLET BY MOUTH EVERY 6 HOURS AS NEEDED 90 tablet 0   No current facility-administered medications for this visit.    Allergies  Allergen Reactions  . Coumadin [Warfarin Sodium] Other (See Comments)    States he can't be on this-bleeds out    Social History   Social History  . Marital Status: Married    Spouse Name: N/A  . Number of Children: N/A  . Years of Education: N/A   Occupational History  . retired Administrator    Social History Main Topics  . Smoking status: Former Smoker -- 30 years    Types: Pipe    Quit date: 09/20/2006  . Smokeless tobacco: Never  Used  . Alcohol Use: No  . Drug Use: No  . Sexual Activity: No     Comment: lives with wife, retired, no dietary restrictions   Other Topics Concern  . Not on file   Social History Narrative    Family History  Problem Relation Age of Onset  . Arthritis Sister     "crippling"  . Arthritis Sister     Review of Systems:  As stated in the HPI and otherwise  negative.   BP 115/80 mmHg  Pulse 65  Resp 12  Ht 6\' 2"  (1.88 m)  Wt 351 lb (159.213 kg)  BMI 45.05 kg/m2  Physical Examination: General: Morbidly obese WM in NAD HEENT: OP clear, mucus membranes moist SKIN: warm, dry. No rashes. Neuro: No focal deficits Musculoskeletal: Muscle strength 5/5 all ext Psychiatric: Mood and affect normal Neck: unable to assess for JVD, no carotid bruits. Lungs:Clear bilaterally, no wheezes, rhonci, crackles Cardiovascular: Regular rate and rhythm. Loud harsh systolic murmur. No gallops or rubs. Abdomen:Soft. Bowel sounds present. Non-tender.  Extremities: 1-2 + bilateral lower extremity edema with chronic venous stasis changes. Pulses are 2 + in the bilateral DP/PT.  Echo: 07/17/15:  Left ventricle: The cavity size was normal. Systolic function was normal. Wall motion was normal; there were no regional wall motion abnormalities. Doppler parameters are consistent with abnormal left ventricular relaxation (grade 1 diastolic dysfunction). - Aortic valve: s/p TAVR. Transvalvular velocity was within the normal range. There was no stenosis. There was no regurgitation. Peak velocity (S): 310 cm/s. Mean gradient (S): 21 mm Hg. VTI ratio of LVOT to aortic valve: 0.68. - Mitral valve: Severely calcified annulus. The findings are consistent with moderate to severe stenosis. Heart rate 46 bpm. Pressure half-time: 131 ms. Mean gradient (D): 10 mm Hg. Valve area by pressure half-time: 1.68 cm^2. - Left atrium: The atrium was severely dilated. - Right ventricle: The cavity size  was normal. Wall thickness was normal. Systolic function was normal. - Tricuspid valve: There was no regurgitation. - Inferior vena cava: The vessel was normal in size. The respirophasic diameter changes were in the normal range (>= 50%), consistent with normal central venous pressure. - Poor image quality due to patient&'s inability to get out of the wheelchair. Images were obtained upright. - Mean gradient across the mitral valve is 10 mmHg (mild MS) compared with 12 mmHg (severe MS) in September, 2016. This may be attributable differences in heart rate and patient positioning.  EKG:  EKG is not ordered today. The ekg ordered today demonstrates   Recent Labs: 12/06/2014: Pro B Natriuretic peptide (BNP) 289.0* 02/21/2015: TSH 1.09 06/06/2015: ALT 20 06/11/2015: Magnesium 2.2 06/13/2015: BUN 14; Creatinine, Ser 1.00; Hemoglobin 10.6*; Platelets 126*; Potassium 4.5; Sodium 136   Lipid Panel    Component Value Date/Time   CHOL 118 02/21/2015 0921   TRIG 84.0 02/21/2015 0921   HDL 29.30* 02/21/2015 0921   CHOLHDL 4 02/21/2015 0921   VLDL 16.8 02/21/2015 0921   LDLCALC 72 02/21/2015 0921     Wt Readings from Last 3 Encounters:  07/17/15 351 lb (159.213 kg)  06/12/15 398 lb 9.5 oz (180.8 kg)  06/09/15 381 lb (172.82 kg)     Other studies Reviewed: Additional studies/ records that were reviewed today include: I have reviewed the echo images from today Review of the above records demonstrates: stable bioprosthetic valve   Assessment and Plan:   1. Severe aortic valve stenosis: s/p TAVR. Doing well. NYHA class 2 symptoms. Echo today shows normally functioning aortic valve prosthesis. He will continue ASA and Plavix. He will need antibiotic prophylaxis before dental or surgical procedures. Follow up in TAVR clinic in one year.   Current medicines are reviewed at length with the patient today.  The patient does not have concerns regarding medicines.  The following  changes have been made:  no change  Labs/ tests ordered today include:   Orders Placed This Encounter  Procedures  . Echocardiogram    Disposition:  FU with me to be arranged after surgical consultation.    Signed, Lauree Chandler, MD 07/17/2015 1:29 PM    [facility] [place], [place], [place]  [address] Phone: [phone]; Fax: [phone]

## 2015-07-17 NOTE — Patient Instructions (Signed)
Medication Instructions:  Your physician recommends that you continue on your current medications as directed. Please refer to the Current Medication list given to you today.   Labwork: none  Testing/Procedures:  Your physician has requested that you have an echocardiogram. Echocardiography is a painless test that uses sound waves to create images of your heart. It provides your doctor with information about the size and shape of your heart and how well your heart's chambers and valves are working. This procedure takes approximately one hour. There are no restrictions for this procedure. To be done in 11 months on day of appt with Dr. Angelena Form    Follow-Up: Your physician wants you to follow-up in: 11 months.  You will receive a reminder letter in the mail two months in advance. If you don't receive a letter, please call our office to schedule the follow-up appointment.   Any Other Special Instructions Will Be Listed Below (If Applicable).     If you need a refill on your cardiac medications before your next appointment, please call your pharmacy.

## 2015-07-18 ENCOUNTER — Ambulatory Visit (INDEPENDENT_AMBULATORY_CARE_PROVIDER_SITE_OTHER): Payer: Medicare Other

## 2015-07-18 DIAGNOSIS — Z23 Encounter for immunization: Secondary | ICD-10-CM | POA: Diagnosis not present

## 2015-07-21 ENCOUNTER — Telehealth: Payer: Self-pay

## 2015-07-21 MED ORDER — LISINOPRIL 10 MG PO TABS: ORAL_TABLET | ORAL | Status: DC

## 2015-07-21 MED ORDER — ATORVASTATIN CALCIUM 40 MG PO TABS: ORAL_TABLET | ORAL | Status: DC

## 2015-07-21 NOTE — Telephone Encounter (Signed)
Both prescriptions sent in as requested.

## 2015-07-21 NOTE — Telephone Encounter (Signed)
Patient wanting medication sent to [facility] in [place] : Lisinopril 10 mg once daily and Atorvastatin 40 mg once daily

## 2015-08-05 ENCOUNTER — Ambulatory Visit (INDEPENDENT_AMBULATORY_CARE_PROVIDER_SITE_OTHER): Payer: Medicare Other | Admitting: Family Medicine

## 2015-08-05 ENCOUNTER — Encounter: Payer: Self-pay | Admitting: Family Medicine

## 2015-08-05 VITALS — BP 120/82 | HR 88 | Temp 97.9°F | Ht 74.0 in | Wt 375.2 lb

## 2015-08-05 DIAGNOSIS — K5900 Constipation, unspecified: Secondary | ICD-10-CM

## 2015-08-05 DIAGNOSIS — D649 Anemia, unspecified: Secondary | ICD-10-CM | POA: Diagnosis not present

## 2015-08-05 DIAGNOSIS — M109 Gout, unspecified: Secondary | ICD-10-CM

## 2015-08-05 DIAGNOSIS — G609 Hereditary and idiopathic neuropathy, unspecified: Secondary | ICD-10-CM | POA: Diagnosis not present

## 2015-08-05 DIAGNOSIS — E7800 Pure hypercholesterolemia, unspecified: Secondary | ICD-10-CM

## 2015-08-05 MED ORDER — GABAPENTIN 300 MG PO CAPS: 300.0000 mg | ORAL_CAPSULE | Freq: Two times a day (BID) | ORAL | Status: DC

## 2015-08-05 NOTE — Progress Notes (Signed)
Pre visit review using our clinic review tool, if applicable. No additional management support is needed unless otherwise documented below in the visit note. 

## 2015-08-05 NOTE — Patient Instructions (Addendum)
Encouraged increased hydration and fiber in diet. Daily probiotics. If bowels not moving can use MOM 2 tbls po in 4 oz of warm prune juice by mouth every 2-3 days. If no results then repeat in 4 hours with  Dulcolax suppository pr, may repeat again in 4 more hours as needed. Seek care if symptoms worsen. Consider daily Miralax and/or Dulcolax if symptoms persist.    Gout Gout is an inflammatory arthritis caused by a buildup of uric acid crystals in the joints. Uric acid is a chemical that is normally present in the blood. When the level of uric acid in the blood is too high it can form crystals that deposit in your joints and tissues. This causes joint redness, soreness, and swelling (inflammation). Repeat attacks are common. Over time, uric acid crystals can form into masses (tophi) near a joint, destroying bone and causing disfigurement. Gout is treatable and often preventable. CAUSES  The disease begins with elevated levels of uric acid in the blood. Uric acid is produced by your body when it breaks down a naturally found substance called purines. Certain foods you eat, such as meats and fish, contain high amounts of purines. Causes of an elevated uric acid level include:  Being passed down from parent to child (heredity).  Diseases that cause increased uric acid production (such as obesity, psoriasis, and certain cancers).  Excessive alcohol use.  Diet, especially diets rich in meat and seafood.  Medicines, including certain cancer-fighting medicines (chemotherapy), water pills (diuretics), and aspirin.  Chronic kidney disease. The kidneys are no longer able to remove uric acid well.  Problems with metabolism. Conditions strongly associated with gout include:  Obesity.  High blood pressure.  High cholesterol.  Diabetes. Not everyone with elevated uric acid levels gets gout. It is not understood why some people get gout and others do not. Surgery, joint injury, and eating too much of  certain foods are some of the factors that can lead to gout attacks. SYMPTOMS   An attack of gout comes on quickly. It causes intense pain with redness, swelling, and warmth in a joint.  Fever can occur.  Often, only one joint is involved. Certain joints are more commonly involved:  Base of the big toe.  Knee.  Ankle.  Wrist.  Finger. Without treatment, an attack usually goes away in a few days to weeks. Between attacks, you usually will not have symptoms, which is different from many other forms of arthritis. DIAGNOSIS  Your caregiver will suspect gout based on your symptoms and exam. In some cases, tests may be recommended. The tests may include:  Blood tests.  Urine tests.  X-rays.  Joint fluid exam. This exam requires a needle to remove fluid from the joint (arthrocentesis). Using a microscope, gout is confirmed when uric acid crystals are seen in the joint fluid. TREATMENT  There are two phases to gout treatment: treating the sudden onset (acute) attack and preventing attacks (prophylaxis).  Treatment of an Acute Attack.  Medicines are used. These include anti-inflammatory medicines or steroid medicines.  An injection of steroid medicine into the affected joint is sometimes necessary.  The painful joint is rested. Movement can worsen the arthritis.  You may use warm or cold treatments on painful joints, depending which works best for you.  Treatment to Prevent Attacks.  If you suffer from frequent gout attacks, your caregiver may advise preventive medicine. These medicines are started after the acute attack subsides. These medicines either help your kidneys eliminate uric acid  from your body or decrease your uric acid production. You may need to stay on these medicines for a very long time.  The early phase of treatment with preventive medicine can be associated with an increase in acute gout attacks. For this reason, during the first few months of treatment, your  caregiver may also advise you to take medicines usually used for acute gout treatment. Be sure you understand your caregiver's directions. Your caregiver may make several adjustments to your medicine dose before these medicines are effective.  Discuss dietary treatment with your caregiver or dietitian. Alcohol and drinks high in sugar and fructose and foods such as meat, poultry, and seafood can increase uric acid levels. Your caregiver or dietitian can advise you on drinks and foods that should be limited. HOME CARE INSTRUCTIONS   Do not take aspirin to relieve pain. This raises uric acid levels.  Only take over-the-counter or prescription medicines for pain, discomfort, or fever as directed by your caregiver.  Rest the joint as much as possible. When in bed, keep sheets and blankets off painful areas.  Keep the affected joint raised (elevated).  Apply warm or cold treatments to painful joints. Use of warm or cold treatments depends on which works best for you.  Use crutches if the painful joint is in your leg.  Drink enough fluids to keep your urine clear or pale yellow. This helps your body get rid of uric acid. Limit alcohol, sugary drinks, and fructose drinks.  Follow your dietary instructions. Pay careful attention to the amount of protein you eat. Your daily diet should emphasize fruits, vegetables, whole grains, and fat-free or low-fat milk products. Discuss the use of coffee, vitamin C, and cherries with your caregiver or dietitian. These may be helpful in lowering uric acid levels.  Maintain a healthy body weight. SEEK MEDICAL CARE IF:   You develop diarrhea, vomiting, or any side effects from medicines.  You do not feel better in 24 hours, or you are getting worse. SEEK IMMEDIATE MEDICAL CARE IF:   Your joint becomes suddenly more tender, and you have chills or a fever. MAKE SURE YOU:   Understand these instructions.  Will watch your condition.  Will get help right away  if you are not doing well or get worse.   This information is not intended to replace advice given to you by your health care provider. Make sure you discuss any questions you have with your health care provider.   Document Released: 09/03/2000 Document Revised: 09/27/2014 Document Reviewed: 04/19/2012 Elsevier Interactive Patient Education [employer].

## 2015-08-06 LAB — LIPID PANEL
Cholesterol: 141 mg/dL (ref 0–200)
HDL: 28.2 mg/dL — ABNORMAL LOW (ref >39.00)
LDL Cholesterol: 79 mg/dL (ref 0–99)
NonHDL: 112.48
Total CHOL/HDL Ratio: 5
Triglycerides: 166 mg/dL — ABNORMAL HIGH (ref 0.0–149.0)
VLDL: 33.2 mg/dL (ref 0.0–40.0)

## 2015-08-06 LAB — COMPREHENSIVE METABOLIC PANEL
ALT: 21 U/L (ref 0–53)
AST: 25 U/L (ref 0–37)
Albumin: 3.8 g/dL (ref 3.5–5.2)
Alkaline Phosphatase: 75 U/L (ref 39–117)
BUN: 26 mg/dL — ABNORMAL HIGH (ref 6–23)
CO2: 29 mEq/L (ref 19–32)
Calcium: 9.5 mg/dL (ref 8.4–10.5)
Chloride: 101 mEq/L (ref 96–112)
Creatinine, Ser: 1.05 mg/dL (ref 0.40–1.50)
GFR: 73.59 mL/min (ref >60.00)
Glucose, Bld: 81 mg/dL (ref 70–99)
Potassium: 5.4 mEq/L — ABNORMAL HIGH (ref 3.5–5.1)
Sodium: 137 mEq/L (ref 135–145)
Total Bilirubin: 0.5 mg/dL (ref 0.2–1.2)
Total Protein: 6.6 g/dL (ref 6.0–8.3)

## 2015-08-06 LAB — CBC
HCT: 38.1 % — ABNORMAL LOW (ref 39.0–52.0)
Hemoglobin: 12.4 g/dL — ABNORMAL LOW (ref 13.0–17.0)
MCHC: 32.5 g/dL (ref 30.0–36.0)
MCV: 91.5 fl (ref 78.0–100.0)
Platelets: 142 10*3/uL — ABNORMAL LOW (ref 150.0–400.0)
RBC: 4.17 Mil/uL — ABNORMAL LOW (ref 4.22–5.81)
RDW: 17.1 % — ABNORMAL HIGH (ref 11.5–15.5)
WBC: 7.6 10*3/uL (ref 4.0–10.5)

## 2015-08-06 LAB — TSH: TSH: 1.54 u[IU]/mL (ref 0.35–4.50)

## 2015-08-06 LAB — URIC ACID: Uric Acid, Serum: 7.5 mg/dL (ref 4.0–7.8)

## 2015-08-07 ENCOUNTER — Other Ambulatory Visit: Payer: Self-pay | Admitting: Family Medicine

## 2015-08-07 DIAGNOSIS — E875 Hyperkalemia: Secondary | ICD-10-CM

## 2015-08-15 DIAGNOSIS — K5900 Constipation, unspecified: Secondary | ICD-10-CM | POA: Insufficient documentation

## 2015-08-15 NOTE — Progress Notes (Signed)
Subjective:    Patient ID: [name], male    DOB: October 20, 1940, 74 y.o.   MRN: [id number]  Chief Complaint  Patient presents with  . Follow-up    HPI Patient is in today for follow up accompanied by his wife. He is feeling well today. His bowels are moving better with no blood seen using Senna A and Miralax prn. No fevers or complaints of acute illness. Continues to be minimally mobile and easily winded but has improved. Denies CP/palp/HA/congestion/fevers/GI or GU c/o. Taking meds as prescribed  Past Medical History  Diagnosis Date  . Valvular heart disease   . Aortic stenosis   . Osteoarthritis   . Anemia   . OSA (obstructive sleep apnea)   . Bursitis   . Gout     takes Allopurinol daily  . Arthritis   . Hyperlipidemia     takes Atorvastatin daily  . DDD (degenerative disc disease)   . Peroneal palsy     significant right foot drop  . History of shingles   . Constipation     takes Miralax daily as well as Senokot daily  . Peripheral edema     takes Furosemide daily  . Hypertension     takes Lisinopril daily  . Complication of anesthesia     Halucinations  . History of blood clots 1962    knee  . Pneumonia 25+yrs ago    hx of  . Peripheral neuropathy (HCC)     takes Gabapentin daily  . History of blood transfusion     no abnormal reaction noted  . Urinary urgency   . Urinary frequency   . Cataract     left immature  . Aortic stenosis, severe     S/p Edwards Sapien 3 Transcatheter Heart Valve (size 26 mm, model # [id number], serial # [id number])    Past Surgical History  Procedure Laterality Date  . Total knee arthroplasty      right  . Spine surgery    . Laminotomy  1193    c6-t2  . Hernia repair      umbilical hernia  . Knee surgery Right     multiple knee surgeries due to complication of R TKA  . Colonoscopy N/A 02/07/2013    Procedure: COLONOSCOPY;  Surgeon: Juanita Craver, MD;  Location[facility] ENDOSCOPY;  Service: Endoscopy;  Laterality: N/A;  .  Esophagogastroduodenoscopy N/A 02/07/2013    Procedure: ESOPHAGOGASTRODUODENOSCOPY (EGD);  Surgeon: Juanita Craver, MD;  Location: [facility] ENDOSCOPY;  Service: Endoscopy;  Laterality: N/A;  . Colonoscopy N/A 02/09/2013    Procedure: COLONOSCOPY;  Surgeon: Beryle Beams, MD;  Location: [place];  Service: Endoscopy;  Laterality: N/A;  . Givens capsule study N/A 02/09/2013    Procedure: GIVENS CAPSULE STUDY;  Surgeon: Beryle Beams, MD;  Location: [place];  Service: Endoscopy;  Laterality: N/A;  . Tee without cardioversion N/A 03/07/2015    Procedure: TRANSESOPHAGEAL ECHOCARDIOGRAM (TEE);  Surgeon: Josue Hector, MD;  Location: [facility] ENDOSCOPY;  Service: Cardiovascular;  Laterality: N/A;  ANES TO BRING PROPOFOL PER DOCTOR  . Cardiac catheterization N/A 05/02/2015    Procedure: Right/Left Heart Cath and Coronary Angiography;  Surgeon: Burnell Blanks, MD;  Location: [place] CV LAB;  Service: Cardiovascular;  Laterality: N/A;  . Transcatheter aortic valve replacement, transfemoral Left 06/10/2015    Procedure: TRANSCATHETER AORTIC VALVE REPLACEMENT, TRANSFEMORAL;  Surgeon: Burnell Blanks, MD;  Location: [place];  Service: Open Heart Surgery;  Laterality: Left;  . Darden Dates  without cardioversion N/A 06/10/2015    Procedure: TRANSESOPHAGEAL ECHOCARDIOGRAM (TEE);  Surgeon: Burnell Blanks, MD;  Location: [place];  Service: Open Heart Surgery;  Laterality: N/A;    Family History  Problem Relation Age of Onset  . Arthritis Sister     "crippling"  . Arthritis Sister     Social History   Social History  . Marital Status: Married    Spouse Name: N/A  . Number of Children: N/A  . Years of Education: N/A   Occupational History  . retired Administrator    Social History Main Topics  . Smoking status: Former Smoker -- 30 years    Types: Pipe    Quit date: 09/20/2006  . Smokeless tobacco: Never Used  . Alcohol Use: No  . Drug Use: No  . Sexual Activity: No     Comment: lives with  wife, retired, no dietary restrictions   Other Topics Concern  . Not on file   Social History Narrative    Outpatient Prescriptions Prior to Visit  Medication Sig Dispense Refill  . acetaminophen (TYLENOL) 500 MG tablet Take 500 mg by mouth 2 (two) times daily.     [name] allopurinol (ZYLOPRIM) 100 MG tablet Take 1 tablet (100 mg total) by mouth 2 (two) times daily. (Patient taking differently: Take 100 mg by mouth daily. ) 60 tablet 2  . aspirin 81 MG tablet Take 1 tablet (81 mg total) by mouth daily. 30 tablet 11  . atorvastatin (LIPITOR) 40 MG tablet TAKE 1 BY MOUTH DAILY 90 tablet 2  . cholecalciferol (VITAMIN D) 1000 UNITS tablet Take 2,000 Units by mouth daily.     . clopidogrel (PLAVIX) 75 MG tablet Take 1 tablet (75 mg total) by mouth daily with breakfast. 30 tablet 11  . Coenzyme Q10 (COQ10) 200 MG CAPS Take 200 mg by mouth daily.    . furosemide (LASIX) 20 MG tablet Take 1 tablet (20 mg total) by mouth daily. 30 tablet 11  . lisinopril (PRINIVIL,ZESTRIL) 10 MG tablet TAKE 1 BY MOUTH DAILY 90 tablet 2  . loratadine (CLARITIN) 10 MG tablet Take 1 tablet (10 mg total) by mouth daily. 30 tablet 0  . Menthol-Methyl Salicylate (MUSCLE RUB) 10-15 % CREA Apply 1 application topically as needed for muscle pain.    . Multiple Vitamin (MULTIVITAMIN WITH MINERALS) TABS Take 1 tablet by mouth daily.    . polyethylene glycol (MIRALAX / GLYCOLAX) packet Take 17 g by mouth daily.    . sennosides-docusate sodium (SENOKOT-S) 8.6-50 MG tablet Take 1 tablet by mouth 2 (two) times daily.    . traMADol (ULTRAM) 50 MG tablet TAKE 1 TABLET BY MOUTH EVERY 6 HOURS AS NEEDED 90 tablet 0  . gabapentin (NEURONTIN) 300 MG capsule TAKE 1 BY MOUTH 3 TIMES DAILY (Patient taking differently: Take 300 mg by mouth 2 (two) times daily. ) 270 capsule 1  . senna (SENOKOT) 8.6 MG TABS tablet Take 1 tablet by mouth daily.     No facility-administered medications prior to visit.    Allergies  Allergen Reactions  .  Coumadin [Warfarin Sodium] Other (See Comments)    States he can't be on this-bleeds out    Review of Systems  Constitutional: Positive for malaise/fatigue. Negative for fever.  HENT: Negative for congestion.   Eyes: Negative for discharge.  Respiratory: Positive for shortness of breath.   Cardiovascular: Negative for chest pain, palpitations and leg swelling.  Gastrointestinal: Negative for nausea and abdominal pain.  Genitourinary: Negative  for dysuria.  Musculoskeletal: Positive for myalgias, back pain and joint pain. Negative for falls.  Skin: Negative for rash.  Neurological: Negative for loss of consciousness and headaches.  Endo/Heme/Allergies: Negative for environmental allergies.  Psychiatric/Behavioral: Negative for depression. The patient is not nervous/anxious.        Objective:    Physical Exam  Constitutional: He is oriented to person, place, and time. He appears well-developed and well-nourished. No distress.  HENT:  Head: Normocephalic and atraumatic.  Nose: Nose normal.  Eyes: Right eye exhibits no discharge. Left eye exhibits no discharge.  Neck: Normal range of motion. Neck supple.  Cardiovascular: Normal rate and regular rhythm.   Murmur heard. Pulmonary/Chest: Effort normal and breath sounds normal.  Abdominal: Soft. Bowel sounds are normal. There is no tenderness.  Musculoskeletal: He exhibits no edema.  Neurological: He is alert and oriented to person, place, and time.  Skin: Skin is warm and dry.  Psychiatric: He has a normal mood and affect.  Nursing note and vitals reviewed.   BP 120/82 mmHg  Pulse 88  Temp(Src) 97.9 F (36.6 C) (Oral)  Ht '6\' 2"'  (1.88 m)  Wt 375 lb 4 oz (170.212 kg)  BMI 48.16 kg/m2  SpO2 95% Wt Readings from Last 3 Encounters:  08/05/15 375 lb 4 oz (170.212 kg)  07/17/15 351 lb (159.213 kg)  06/12/15 398 lb 9.5 oz (180.8 kg)     Lab Results  Component Value Date   WBC 7.6 08/05/2015   HGB 12.4* 08/05/2015   HCT  38.1* 08/05/2015   PLT 142.0* 08/05/2015   GLUCOSE 81 08/05/2015   CHOL 141 08/05/2015   TRIG 166.0* 08/05/2015   HDL 28.20* 08/05/2015   LDLCALC 79 08/05/2015   ALT 21 08/05/2015   AST 25 08/05/2015   NA 137 08/05/2015   K 5.4* 08/05/2015   CL 101 08/05/2015   CREATININE 1.05 08/05/2015   BUN 26* 08/05/2015   CO2 29 08/05/2015   TSH 1.54 08/05/2015   INR 1.32 06/10/2015   HGBA1C 5.7* 06/06/2015    Lab Results  Component Value Date   TSH 1.54 08/05/2015   Lab Results  Component Value Date   WBC 7.6 08/05/2015   HGB 12.4* 08/05/2015   HCT 38.1* 08/05/2015   MCV 91.5 08/05/2015   PLT 142.0* 08/05/2015   Lab Results  Component Value Date   NA 137 08/05/2015   K 5.4* 08/05/2015   CO2 29 08/05/2015   GLUCOSE 81 08/05/2015   BUN 26* 08/05/2015   CREATININE 1.05 08/05/2015   BILITOT 0.5 08/05/2015   ALKPHOS 75 08/05/2015   AST 25 08/05/2015   ALT 21 08/05/2015   PROT 6.6 08/05/2015   ALBUMIN 3.8 08/05/2015   CALCIUM 9.5 08/05/2015   ANIONGAP 3* 06/13/2015   GFR 73.59 08/05/2015   Lab Results  Component Value Date   CHOL 141 08/05/2015   Lab Results  Component Value Date   HDL 28.20* 08/05/2015   Lab Results  Component Value Date   LDLCALC 79 08/05/2015   Lab Results  Component Value Date   TRIG 166.0* 08/05/2015   Lab Results  Component Value Date   CHOLHDL 5 08/05/2015   Lab Results  Component Value Date   HGBA1C 5.7* 06/06/2015       Assessment & Plan:   Problem List Items Addressed This Visit    Anemia    Improving. Increase leafy greens, consider increased lean red meat and using cast iron cookware. Continue to monitor, report  any concerns      Relevant Medications   gabapentin (NEURONTIN) 300 MG capsule   Other Relevant Orders   TSH (Completed)   CBC (Completed)   Lipid panel (Completed)   Comp Met (CMET) (Completed)   Constipation    Responding to a mix of miralax and senna s prn, encouraged adequate hydration and fiber  supplements      Gout    No flares since starting current dose of Allopurinol      Relevant Medications   gabapentin (NEURONTIN) 300 MG capsule   Other Relevant Orders   TSH (Completed)   CBC (Completed)   Lipid panel (Completed)   Comp Met (CMET) (Completed)   Uric acid (Completed)   HYPERCHOLESTEROLEMIA - Primary   Relevant Medications   gabapentin (NEURONTIN) 300 MG capsule   Other Relevant Orders   TSH (Completed)   CBC (Completed)   Lipid panel (Completed)   Comp Met (CMET) (Completed)   OBESITY, MORBID    Encouraged DASH diet, decrease po intake and increase exercise as tolerated. Needs 7-8 hours of sleep nightly. Avoid trans fats, eat small, frequent meals every 4-5 hours with lean proteins, complex carbs and healthy fats. Minimize simple carbs, GMO foods.      Relevant Medications   gabapentin (NEURONTIN) 300 MG capsule   Other Relevant Orders   TSH (Completed)   CBC (Completed)   Lipid panel (Completed)   Comp Met (CMET) (Completed)    Other Visit Diagnoses    Hereditary and idiopathic peripheral neuropathy        Relevant Medications    gabapentin (NEURONTIN) 300 MG capsule    Other Relevant Orders    TSH (Completed)    CBC (Completed)    Lipid panel (Completed)    Comp Met (CMET) (Completed)       I have discontinued Mr. Obremski's senna. I have also changed his gabapentin. Additionally, I am having him maintain his cholecalciferol, sennosides-docusate sodium, multivitamin with minerals, loratadine, furosemide, acetaminophen, CoQ10, allopurinol, polyethylene glycol, MUSCLE RUB, clopidogrel, aspirin, traMADol, atorvastatin, and lisinopril.  Meds ordered this encounter  Medications  . gabapentin (NEURONTIN) 300 MG capsule    Sig: Take 1 capsule (300 mg total) by mouth 2 (two) times daily.    Dispense:  180 capsule    Refill:  1    DISCONTINUE ALL PREVIOUS REFILLS FOR THIS MEDICATION     Penni Homans, MD

## 2015-08-15 NOTE — Assessment & Plan Note (Signed)
Encouraged minimize sodium stay as active as tolerated, continue current meds and elevate feet

## 2015-08-15 NOTE — Assessment & Plan Note (Signed)
Encouraged DASH diet, decrease po intake and increase exercise as tolerated. Needs 7-8 hours of sleep nightly. Avoid trans fats, eat small, frequent meals every 4-5 hours with lean proteins, complex carbs and healthy fats. Minimize simple carbs, GMO foods. 

## 2015-08-15 NOTE — Assessment & Plan Note (Signed)
Improving. Increase leafy greens, consider increased lean red meat and using cast iron cookware. Continue to monitor, report any concerns 

## 2015-08-15 NOTE — Assessment & Plan Note (Signed)
No flares since starting current dose of Allopurinol

## 2015-08-15 NOTE — Assessment & Plan Note (Signed)
Responding to a mix of miralax and senna s prn, encouraged adequate hydration and fiber supplements

## 2015-08-21 ENCOUNTER — Other Ambulatory Visit: Payer: Medicare Other

## 2015-08-26 ENCOUNTER — Other Ambulatory Visit (INDEPENDENT_AMBULATORY_CARE_PROVIDER_SITE_OTHER): Payer: Medicare Other

## 2015-08-26 DIAGNOSIS — E875 Hyperkalemia: Secondary | ICD-10-CM | POA: Diagnosis not present

## 2015-08-26 LAB — COMPREHENSIVE METABOLIC PANEL
ALT: 23 U/L (ref 0–53)
AST: 24 U/L (ref 0–37)
Albumin: 3.8 g/dL (ref 3.5–5.2)
Alkaline Phosphatase: 80 U/L (ref 39–117)
BUN: 23 mg/dL (ref 6–23)
CO2: 30 mEq/L (ref 19–32)
Calcium: 9.3 mg/dL (ref 8.4–10.5)
Chloride: 102 mEq/L (ref 96–112)
Creatinine, Ser: 1.03 mg/dL (ref 0.40–1.50)
GFR: 75.23 mL/min (ref >60.00)
Glucose, Bld: 91 mg/dL (ref 70–99)
Potassium: 4.9 mEq/L (ref 3.5–5.1)
Sodium: 138 mEq/L (ref 135–145)
Total Bilirubin: 0.4 mg/dL (ref 0.2–1.2)
Total Protein: 6.7 g/dL (ref 6.0–8.3)

## 2015-10-02 ENCOUNTER — Encounter: Payer: Self-pay | Admitting: Family Medicine

## 2015-10-02 ENCOUNTER — Encounter: Payer: Self-pay | Admitting: *Deleted

## 2015-10-02 ENCOUNTER — Ambulatory Visit (INDEPENDENT_AMBULATORY_CARE_PROVIDER_SITE_OTHER): Payer: PPO | Admitting: Family Medicine

## 2015-10-02 DIAGNOSIS — I872 Venous insufficiency (chronic) (peripheral): Secondary | ICD-10-CM | POA: Diagnosis not present

## 2015-10-02 DIAGNOSIS — I350 Nonrheumatic aortic (valve) stenosis: Secondary | ICD-10-CM

## 2015-10-02 DIAGNOSIS — L0390 Cellulitis, unspecified: Secondary | ICD-10-CM | POA: Diagnosis not present

## 2015-10-02 MED ORDER — FLUCONAZOLE 150 MG PO TABS: 150.0000 mg | ORAL_TABLET | ORAL | Status: DC

## 2015-10-02 MED ORDER — CEPHALEXIN 500 MG PO CAPS: 500.0000 mg | ORAL_CAPSULE | Freq: Four times a day (QID) | ORAL | Status: DC

## 2015-10-02 MED ORDER — HYDROCORTISONE ACETATE 25 MG RE SUPP: 25.0000 mg | Freq: Two times a day (BID) | RECTAL | Status: DC

## 2015-10-02 NOTE — Progress Notes (Signed)
Patient ID: [name], male   DOB: Jun 07, 1941, 75 y.o.   MRN: [id number]   Subjective:    Patient ID: [name], male    DOB: [birth year], 75 y.o.   MRN: [id number]  Chief Complaint  Patient presents with  . Leg Pain    left leg redness    HPI Patient is in today for evaluation of worsening redness and discomfort in his legs. He struggles with chronic peripheral edema secondary to venous insufficiency and immobility but his symptoms recently worsened. He has increased redness and warmth in his left lower leg. No fevers or chills. He notes it has been flared to some degree off and on for several months. No other acute complaints. Denies CP/palp/HA/congestion/fevers/GI or GU c/o. Taking meds as prescribed  Past Medical History  Diagnosis Date  . Valvular heart disease   . Aortic stenosis   . Osteoarthritis   . Anemia   . OSA (obstructive sleep apnea)   . Bursitis   . Gout     takes Allopurinol daily  . Arthritis   . Hyperlipidemia     takes Atorvastatin daily  . DDD (degenerative disc disease)   . Peroneal palsy     significant right foot drop  . History of shingles   . Constipation     takes Miralax daily as well as Senokot daily  . Peripheral edema     takes Furosemide daily  . Hypertension     takes Lisinopril daily  . Complication of anesthesia     Halucinations  . History of blood clots 1962    knee  . Pneumonia 25+yrs ago    hx of  . Peripheral neuropathy (HCC)     takes Gabapentin daily  . History of blood transfusion     no abnormal reaction noted  . Urinary urgency   . Urinary frequency   . Cataract     left immature  . Aortic stenosis, severe     S/p Edwards Sapien 3 Transcatheter Heart Valve (size 26 mm, model # [id number], serial # [id number])  . Cellulitis 10/12/2015    Past Surgical History  Procedure Laterality Date  . Total knee arthroplasty      right  . Spine surgery    . Laminotomy  1193    c6-t2  . Hernia repair      umbilical hernia   . Knee surgery Right     multiple knee surgeries due to complication of R TKA  . Colonoscopy N/A 02/07/2013    Procedure: COLONOSCOPY;  Surgeon: Juanita Craver, MD;  Location: [facility] ENDOSCOPY;  Service: Endoscopy;  Laterality: N/A;  . Esophagogastroduodenoscopy N/A 02/07/2013    Procedure: ESOPHAGOGASTRODUODENOSCOPY (EGD);  Surgeon: Juanita Craver, MD;  Location: [facility] ENDOSCOPY;  Service: Endoscopy;  Laterality: N/A;  . Colonoscopy N/A 02/09/2013    Procedure: COLONOSCOPY;  Surgeon: Beryle Beams, MD;  Location: [place];  Service: Endoscopy;  Laterality: N/A;  . Givens capsule study N/A 02/09/2013    Procedure: GIVENS CAPSULE STUDY;  Surgeon: Beryle Beams, MD;  Location: [place];  Service: Endoscopy;  Laterality: N/A;  . Tee without cardioversion N/A 03/07/2015    Procedure: TRANSESOPHAGEAL ECHOCARDIOGRAM (TEE);  Surgeon: Josue Hector, MD;  Location: [facility] ENDOSCOPY;  Service: Cardiovascular;  Laterality: N/A;  ANES TO BRING PROPOFOL PER DOCTOR  . Cardiac catheterization N/A 05/02/2015    Procedure: Right/Left Heart Cath and Coronary Angiography;  Surgeon: Burnell Blanks, MD;  Location: [place] CV LAB;  Service: Cardiovascular;  Laterality: N/A;  . Transcatheter aortic valve replacement, transfemoral Left 06/10/2015    Procedure: TRANSCATHETER AORTIC VALVE REPLACEMENT, TRANSFEMORAL;  Surgeon: Burnell Blanks, MD;  Location: [place];  Service: Open Heart Surgery;  Laterality: Left;  . Tee without cardioversion N/A 06/10/2015    Procedure: TRANSESOPHAGEAL ECHOCARDIOGRAM (TEE);  Surgeon: Burnell Blanks, MD;  Location: [place];  Service: Open Heart Surgery;  Laterality: N/A;    Family History  Problem Relation Age of Onset  . Arthritis Sister     "crippling"  . Arthritis Sister     Social History   Social History  . Marital Status: Married    Spouse Name: N/A  . Number of Children: N/A  . Years of Education: N/A   Occupational History  . retired Administrator     Social History Main Topics  . Smoking status: Former Smoker -- 30 years    Types: Pipe    Quit date: 09/20/2006  . Smokeless tobacco: Never Used  . Alcohol Use: No  . Drug Use: No  . Sexual Activity: No     Comment: lives with wife, retired, no dietary restrictions   Other Topics Concern  . Not on file   Social History Narrative    Outpatient Prescriptions Prior to Visit  Medication Sig Dispense Refill  . acetaminophen (TYLENOL) 500 MG tablet Take 500 mg by mouth 2 (two) times daily.     [name] aspirin 81 MG tablet Take 1 tablet (81 mg total) by mouth daily. 30 tablet 11  . cholecalciferol (VITAMIN D) 1000 UNITS tablet Take 2,000 Units by mouth daily.     . clopidogrel (PLAVIX) 75 MG tablet Take 1 tablet (75 mg total) by mouth daily with breakfast. 30 tablet 11  . Coenzyme Q10 (COQ10) 200 MG CAPS Take 200 mg by mouth daily.    [name] gabapentin (NEURONTIN) 300 MG capsule Take 1 capsule (300 mg total) by mouth 2 (two) times daily. 180 capsule 1  . loratadine (CLARITIN) 10 MG tablet Take 1 tablet (10 mg total) by mouth daily. 30 tablet 0  . Menthol-Methyl Salicylate (MUSCLE RUB) 10-15 % CREA Apply 1 application topically as directed.     . Multiple Vitamin (MULTIVITAMIN WITH MINERALS) TABS Take 1 tablet by mouth daily.    . polyethylene glycol (MIRALAX / GLYCOLAX) packet Take 17 g by mouth daily.    . sennosides-docusate sodium (SENOKOT-S) 8.6-50 MG tablet Take 1 tablet by mouth 2 (two) times daily.    [name] allopurinol (ZYLOPRIM) 100 MG tablet Take 1 tablet (100 mg total) by mouth 2 (two) times daily. (Patient taking differently: Take 100 mg by mouth daily. ) 60 tablet 2  . atorvastatin (LIPITOR) 40 MG tablet TAKE 1 BY MOUTH DAILY 90 tablet 2  . furosemide (LASIX) 20 MG tablet Take 1 tablet (20 mg total) by mouth daily. 30 tablet 11  . lisinopril (PRINIVIL,ZESTRIL) 10 MG tablet TAKE 1 BY MOUTH DAILY 90 tablet 2  . traMADol (ULTRAM) 50 MG tablet TAKE 1 TABLET BY MOUTH EVERY 6 HOURS AS NEEDED 90  tablet 0   No facility-administered medications prior to visit.    Allergies  Allergen Reactions  . Coumadin [Warfarin Sodium] Other (See Comments)    States he can't be on this-bleeds out    Review of Systems  Constitutional: Positive for malaise/fatigue. Negative for fever.  HENT: Negative for congestion.   Eyes: Negative for discharge.  Respiratory: Positive for sputum production. Negative for shortness of breath.  Cardiovascular: Positive for leg swelling. Negative for chest pain and palpitations.  Gastrointestinal: Negative for nausea and abdominal pain.  Genitourinary: Negative for dysuria.  Musculoskeletal: Negative for falls.  Skin: Positive for rash.  Neurological: Negative for loss of consciousness and headaches.  Endo/Heme/Allergies: Negative for environmental allergies.  Psychiatric/Behavioral: Negative for depression. The patient is not nervous/anxious.        Objective:    Physical Exam  Constitutional: He is oriented to person, place, and time. He appears well-developed and well-nourished. No distress.  HENT:  Head: Normocephalic and atraumatic.  Nose: Nose normal.  Eyes: Right eye exhibits no discharge. Left eye exhibits no discharge.  Neck: Normal range of motion. Neck supple.  Cardiovascular: Normal rate and regular rhythm.  Exam reveals no gallop and no friction rub.   Murmur heard. Pulmonary/Chest: Effort normal and breath sounds normal.  Abdominal: Soft. Bowel sounds are normal. There is no tenderness.  Musculoskeletal: He exhibits edema.  B/l LE edema, L>R, erythema noted to mid calf b/l  Neurological: He is alert and oriented to person, place, and time.  Skin: Skin is warm and dry.  Psychiatric: He has a normal mood and affect.  Nursing note and vitals reviewed.   BP 122/82 mmHg  Pulse 91  Temp(Src) 98.2 F (36.8 C) (Oral)  Ht 6\' 2"  (1.88 m)  Wt 375 lb (170.099 kg)  BMI 48.13 kg/m2  SpO2 95% Wt Readings from Last 3 Encounters:  10/03/15  375 lb (170.099 kg)  10/02/15 375 lb (170.099 kg)  08/05/15 375 lb 4 oz (170.212 kg)     Lab Results  Component Value Date   WBC 7.6 08/05/2015   HGB 12.4* 08/05/2015   HCT 38.1* 08/05/2015   PLT 142.0* 08/05/2015   GLUCOSE 91 08/26/2015   CHOL 141 08/05/2015   TRIG 166.0* 08/05/2015   HDL 28.20* 08/05/2015   LDLCALC 79 08/05/2015   ALT 23 08/26/2015   AST 24 08/26/2015   NA 138 08/26/2015   K 4.9 08/26/2015   CL 102 08/26/2015   CREATININE 1.03 08/26/2015   BUN 23 08/26/2015   CO2 30 08/26/2015   TSH 1.54 08/05/2015   INR 1.32 06/10/2015   HGBA1C 5.7* 06/06/2015    Lab Results  Component Value Date   TSH 1.54 08/05/2015   Lab Results  Component Value Date   WBC 7.6 08/05/2015   HGB 12.4* 08/05/2015   HCT 38.1* 08/05/2015   MCV 91.5 08/05/2015   PLT 142.0* 08/05/2015   Lab Results  Component Value Date   NA 138 08/26/2015   K 4.9 08/26/2015   CO2 30 08/26/2015   GLUCOSE 91 08/26/2015   BUN 23 08/26/2015   CREATININE 1.03 08/26/2015   BILITOT 0.4 08/26/2015   ALKPHOS 80 08/26/2015   AST 24 08/26/2015   ALT 23 08/26/2015   PROT 6.7 08/26/2015   ALBUMIN 3.8 08/26/2015   CALCIUM 9.3 08/26/2015   ANIONGAP 3* 06/13/2015   GFR 75.23 08/26/2015   Lab Results  Component Value Date   CHOL 141 08/05/2015   Lab Results  Component Value Date   HDL 28.20* 08/05/2015   Lab Results  Component Value Date   LDLCALC 79 08/05/2015   Lab Results  Component Value Date   TRIG 166.0* 08/05/2015   Lab Results  Component Value Date   CHOLHDL 5 08/05/2015   Lab Results  Component Value Date   HGBA1C 5.7* 06/06/2015       Assessment & Plan:   Problem List  Items Addressed This Visit    Cellulitis    Started on antibiotics, keep legs clean and dry, clean with mild soap daily. Report if symptoms worsen.      Chronic venous insufficiency    Struggles with chronic edema encouraged to minimize sodium and to stay as active as tolerated. Elevate feet above  head      OBESITY, MORBID - Primary    Encouraged DASH diet, decrease po intake and increase exercise as tolerated. Needs 7-8 hours of sleep nightly. Avoid trans fats, eat small, frequent meals every 4-5 hours with lean proteins, complex carbs and healthy fats. Minimize simple carbs      Severe aortic valve stenosis    Has toleated valve replacement well         I am having [name] start on hydrocortisone, cephALEXin, and fluconazole. I am also having him maintain his cholecalciferol, sennosides-docusate sodium, multivitamin with minerals, loratadine, acetaminophen, CoQ10, polyethylene glycol, MUSCLE RUB, clopidogrel, aspirin, and gabapentin.  Meds ordered this encounter  Medications  . hydrocortisone (ANUSOL-HC) 25 MG suppository    Sig: Place 1 suppository (25 mg total) rectally 2 (two) times daily.    Dispense:  12 suppository    Refill:  0  . cephALEXin (KEFLEX) 500 MG capsule    Sig: Take 1 capsule (500 mg total) by mouth 4 (four) times daily.    Dispense:  40 capsule    Refill:  0  . fluconazole (DIFLUCAN) 150 MG tablet    Sig: Take 1 tablet (150 mg total) by mouth once a week.    Dispense:  3 tablet    Refill:  0    Willette Alma, MD

## 2015-10-02 NOTE — Progress Notes (Signed)
Pre visit review using our clinic review tool, if applicable. No additional management support is needed unless otherwise documented below in the visit note. 

## 2015-10-02 NOTE — Patient Instructions (Signed)
Open air with witch hazel cleanse twice daily   Cellulitis Cellulitis is an infection of the skin and the tissue beneath it. The infected area is usually red and tender. Cellulitis occurs most often in the arms and lower legs.  CAUSES  Cellulitis is caused by bacteria that enter the skin through cracks or cuts in the skin. The most common types of bacteria that cause cellulitis are staphylococci and streptococci. SIGNS AND SYMPTOMS   Redness and warmth.  Swelling.  Tenderness or pain.  Fever. DIAGNOSIS  Your health care provider can usually determine what is wrong based on a physical exam. Blood tests may also be done. TREATMENT  Treatment usually involves taking an antibiotic medicine. HOME CARE INSTRUCTIONS   Take your antibiotic medicine as directed by your health care provider. Finish the antibiotic even if you start to feel better.  Keep the infected arm or leg elevated to reduce swelling.  Apply a warm cloth to the affected area up to 4 times per day to relieve pain.  Take medicines only as directed by your health care provider.  Keep all follow-up visits as directed by your health care provider. SEEK MEDICAL CARE IF:   You notice red streaks coming from the infected area.  Your red area gets larger or turns dark in color.  Your bone or joint underneath the infected area becomes painful after the skin has healed.  Your infection returns in the same area or another area.  You notice a swollen bump in the infected area.  You develop new symptoms.  You have a fever. SEEK IMMEDIATE MEDICAL CARE IF:   You feel very sleepy.  You develop vomiting or diarrhea.  You have a general ill feeling (malaise) with muscle aches and pains.   This information is not intended to replace advice given to you by your health care provider. Make sure you discuss any questions you have with your health care provider.   Document Released: 06/16/2005 Document Revised: 05/28/2015  Document Reviewed: 11/22/2011 Elsevier Interactive Patient Education [employer].

## 2015-10-03 ENCOUNTER — Ambulatory Visit (INDEPENDENT_AMBULATORY_CARE_PROVIDER_SITE_OTHER): Payer: PPO | Admitting: Cardiovascular Disease

## 2015-10-03 ENCOUNTER — Encounter: Payer: Self-pay | Admitting: Cardiovascular Disease

## 2015-10-03 VITALS — BP 96/60 | HR 65 | Ht 74.0 in | Wt 375.0 lb

## 2015-10-03 DIAGNOSIS — I350 Nonrheumatic aortic (valve) stenosis: Secondary | ICD-10-CM | POA: Diagnosis not present

## 2015-10-03 DIAGNOSIS — S81802A Unspecified open wound, left lower leg, initial encounter: Secondary | ICD-10-CM | POA: Diagnosis not present

## 2015-10-03 DIAGNOSIS — I359 Nonrheumatic aortic valve disorder, unspecified: Secondary | ICD-10-CM

## 2015-10-03 MED ORDER — FUROSEMIDE 20 MG PO TABS: 20.0000 mg | ORAL_TABLET | Freq: Two times a day (BID) | ORAL | Status: DC

## 2015-10-03 NOTE — Progress Notes (Signed)
Patient ID: [name], male   DOB: 02-May-1941, 75 y.o.   MRN: [id number]    Chief Complaint  Patient presents with  . Aortic Stenosis     History of Present Illness: 75 y.o.  male with history of morbid obesity, mild HTN, HLD, gout and severe aortic stenosis who is here today for TAVR  follow up.[name] He underwent TAVR on 06/10/15 with 26 mm Sapien 3 . Left transfemoral approach with cut down by Dr Cyndia Bent He did well following the procedure. There were no complications. He tells me today that he is feeling better. His breathing has improved. He has no pain.He is still limited by chronic joint issues. Started on Keflex and Diflucon by Dr Randel Pigg for LLE cellulitis More energy and less dyspnea since valve done   Primary Care Physician: Penni Homans  Past Medical History  Diagnosis Date  . Valvular heart disease   . Aortic stenosis   . Osteoarthritis   . Anemia   . OSA (obstructive sleep apnea)   . Bursitis   . Gout     takes Allopurinol daily  . Arthritis   . Hyperlipidemia     takes Atorvastatin daily  . DDD (degenerative disc disease)   . Peroneal palsy     significant right foot drop  . History of shingles   . Constipation     takes Miralax daily as well as Senokot daily  . Peripheral edema     takes Furosemide daily  . Hypertension     takes Lisinopril daily  . Complication of anesthesia     Halucinations  . History of blood clots 1962    knee  . Pneumonia 25+yrs ago    hx of  . Peripheral neuropathy (HCC)     takes Gabapentin daily  . History of blood transfusion     no abnormal reaction noted  . Urinary urgency   . Urinary frequency   . Cataract     left immature  . Aortic stenosis, severe     S/p Edwards Sapien 3 Transcatheter Heart Valve (size 26 mm, model # [id number], serial # [id number])    Past Surgical History  Procedure Laterality Date  . Total knee arthroplasty      right  . Spine surgery    . Laminotomy  1193    c6-t2  . Hernia repair     umbilical hernia  . Knee surgery Right     multiple knee surgeries due to complication of R TKA  . Colonoscopy N/A 02/07/2013    Procedure: COLONOSCOPY;  Surgeon: Juanita Craver, MD;  Location: [facility] ENDOSCOPY;  Service: Endoscopy;  Laterality: N/A;  . Esophagogastroduodenoscopy N/A 02/07/2013    Procedure: ESOPHAGOGASTRODUODENOSCOPY (EGD);  Surgeon: Juanita Craver, MD;  Location: [facility] ENDOSCOPY;  Service: Endoscopy;  Laterality: N/A;  . Colonoscopy N/A 02/09/2013    Procedure: COLONOSCOPY;  Surgeon: Beryle Beams, MD;  Location: [place];  Service: Endoscopy;  Laterality: N/A;  . Givens capsule study N/A 02/09/2013    Procedure: GIVENS CAPSULE STUDY;  Surgeon: Beryle Beams, MD;  Location: [place];  Service: Endoscopy;  Laterality: N/A;  . Tee without cardioversion N/A 03/07/2015    Procedure: TRANSESOPHAGEAL ECHOCARDIOGRAM (TEE);  Surgeon: Josue Hector, MD;  Location: [facility] ENDOSCOPY;  Service: Cardiovascular;  Laterality: N/A;  ANES TO BRING PROPOFOL PER DOCTOR  . Cardiac catheterization N/A 05/02/2015    Procedure: Right/Left Heart Cath and Coronary Angiography;  Surgeon: Burnell Blanks, MD;  Location: [place]  CV LAB;  Service: Cardiovascular;  Laterality: N/A;  . Transcatheter aortic valve replacement, transfemoral Left 06/10/2015    Procedure: TRANSCATHETER AORTIC VALVE REPLACEMENT, TRANSFEMORAL;  Surgeon: Burnell Blanks, MD;  Location: [place];  Service: Open Heart Surgery;  Laterality: Left;  . Tee without cardioversion N/A 06/10/2015    Procedure: TRANSESOPHAGEAL ECHOCARDIOGRAM (TEE);  Surgeon: Burnell Blanks, MD;  Location: [place];  Service: Open Heart Surgery;  Laterality: N/A;    Current Outpatient Prescriptions  Medication Sig Dispense Refill  . acetaminophen (TYLENOL) 500 MG tablet Take 500 mg by mouth 2 (two) times daily.     [name] allopurinol (ZYLOPRIM) 100 MG tablet Take 100 mg by mouth daily.    [name] aspirin 81 MG tablet Take 1 tablet (81 mg total) by mouth daily.  30 tablet 11  . atorvastatin (LIPITOR) 40 MG tablet Take 40 mg by mouth daily.    . cephALEXin (KEFLEX) 500 MG capsule Take 1 capsule (500 mg total) by mouth 4 (four) times daily. 40 capsule 0  . cholecalciferol (VITAMIN D) 1000 UNITS tablet Take 2,000 Units by mouth daily.     . clopidogrel (PLAVIX) 75 MG tablet Take 1 tablet (75 mg total) by mouth daily with breakfast. 30 tablet 11  . Coenzyme Q10 (COQ10) 200 MG CAPS Take 200 mg by mouth daily.    . fluconazole (DIFLUCAN) 150 MG tablet Take 1 tablet (150 mg total) by mouth once a week. 3 tablet 0  . furosemide (LASIX) 20 MG tablet Take 1 tablet (20 mg total) by mouth daily. 30 tablet 11  . gabapentin (NEURONTIN) 300 MG capsule Take 1 capsule (300 mg total) by mouth 2 (two) times daily. 180 capsule 1  . hydrocortisone (ANUSOL-HC) 25 MG suppository Place 1 suppository (25 mg total) rectally 2 (two) times daily. 12 suppository 0  . lisinopril (PRINIVIL,ZESTRIL) 10 MG tablet Take 10 mg by mouth daily.    [name] loratadine (CLARITIN) 10 MG tablet Take 1 tablet (10 mg total) by mouth daily. 30 tablet 0  . Menthol-Methyl Salicylate (MUSCLE RUB) 10-15 % CREA Apply 1 application topically as directed.     . Multiple Vitamin (MULTIVITAMIN WITH MINERALS) TABS Take 1 tablet by mouth daily.    . polyethylene glycol (MIRALAX / GLYCOLAX) packet Take 17 g by mouth daily.    . sennosides-docusate sodium (SENOKOT-S) 8.6-50 MG tablet Take 1 tablet by mouth 2 (two) times daily.    . traMADol (ULTRAM) 50 MG tablet Take 50 mg by mouth every 6 (six) hours as needed for moderate pain or severe pain.     No current facility-administered medications for this visit.    Allergies  Allergen Reactions  . Coumadin [Warfarin Sodium] Other (See Comments)    States he can't be on this-bleeds out    Social History   Social History  . Marital Status: Married    Spouse Name: N/A  . Number of Children: N/A  . Years of Education: N/A   Occupational History  . retired  Administrator    Social History Main Topics  . Smoking status: Former Smoker -- 30 years    Types: Pipe    Quit date: 09/20/2006  . Smokeless tobacco: Never Used  . Alcohol Use: No  . Drug Use: No  . Sexual Activity: No     Comment: lives with wife, retired, no dietary restrictions   Other Topics Concern  . Not on file   Social History Narrative    Family History  Problem Relation Age of Onset  . Arthritis Sister     "crippling"  . Arthritis Sister     Review of Systems:  As stated in the HPI and otherwise negative.   BP 96/60 mmHg  Pulse 65  Ht 6\' 2"  (1.88 m)  Wt 170.099 kg (375 lb)  BMI 48.13 kg/m2  SpO2 93%  Physical Examination: General: Morbidly obese WM in NAD HEENT: OP clear, mucus membranes moist SKIN: warm, dry. No rashes. Neuro: No focal deficits Musculoskeletal: Muscle strength 5/5 all ext Psychiatric: Mood and affect normal Neck: unable to assess for JVD, no carotid bruits. Lungs:Clear bilaterally, no wheezes, rhonci, crackles Cardiovascular: Regular rate and rhythm. Loud harsh systolic murmur. No gallops or rubs. Abdomen:Soft. Bowel sounds present. Non-tender.  Extremities: 1-2 + bilateral lower extremity edema with chronic venous stasis changes. Pulses are 2 + in the bilateral DP/PT. Cellulitis LLE erythema improved on antibiotics per patien t  Echo: 07/17/15:  Left ventricle: The cavity size was normal. Systolic function was normal. Wall motion was normal; there were no regional wall motion abnormalities. Doppler parameters are consistent with abnormal left ventricular relaxation (grade 1 diastolic dysfunction). - Aortic valve: s/p TAVR. Transvalvular velocity was within the normal range. There was no stenosis. There was no regurgitation. Peak velocity (S): 310 cm/s. Mean gradient (S): 21 mm Hg. VTI ratio of LVOT to aortic valve: 0.68. - Mitral valve: Severely calcified annulus. The findings are consistent with moderate to  severe stenosis. Heart rate 46 bpm. Pressure half-time: 131 ms. Mean gradient (D): 10 mm Hg. Valve area by pressure half-time: 1.68 cm^2. - Left atrium: The atrium was severely dilated. - Right ventricle: The cavity size was normal. Wall thickness was normal. Systolic function was normal. - Tricuspid valve: There was no regurgitation. - Inferior vena cava: The vessel was normal in size. The respirophasic diameter changes were in the normal range (>= 50%), consistent with normal central venous pressure. - Poor image quality due to patient&'s inability to get out of the wheelchair. Images were obtained upright. - Mean gradient across the mitral valve is 10 mmHg (mild MS) compared with 12 mmHg (severe MS) in September, 2016. This may be attributable differences in heart rate and patient positioning.  EKG:    06/10/16  SR rate 86 RBBB stable   Recent Labs: 12/06/2014: Pro B Natriuretic peptide (BNP) 289.0* 06/11/2015: Magnesium 2.2 08/05/2015: Hemoglobin 12.4*; Platelets 142.0*; TSH 1.54 08/26/2015: ALT 23; BUN 23; Creatinine, Ser 1.03; Potassium 4.9; Sodium 138   Lipid Panel    Component Value Date/Time   CHOL 141 08/05/2015 1437   TRIG 166.0* 08/05/2015 1437   HDL 28.20* 08/05/2015 1437   CHOLHDL 5 08/05/2015 1437   VLDL 33.2 08/05/2015 1437   LDLCALC 79 08/05/2015 1437     Wt Readings from Last 3 Encounters:  10/03/15 170.099 kg (375 lb)  10/02/15 170.099 kg (375 lb)  08/05/15 170.212 kg (375 lb 4 oz)     Other studies Reviewed: Additional studies/ records that were reviewed today include: I have reviewed the echo images from today Review of the above records demonstrates: stable bioprosthetic valve   Assessment and Plan:   1. Severe aortic valve stenosis: s/p TAVR. Doing well. NYHA class 2 symptoms. Echo today shows normally functioning aortic valve prosthesis. He will continue ASA and Plavix. He will need antibiotic prophylaxis before dental or surgical  procedures.  2. Cellulitis:  Refer to wound clinic as he is at high risk for SBE with new prosthetic valve.  Increase lasix to 20 bid to help healing/lower edema  Continue Keflex/Diflucan 3. Chol:   Cholesterol is at goal.  Continue current dose of statin and diet Rx.  No myalgias or side effects.  F/U  LFT's in 6 months. Lab Results  Component Value Date   LDLCALC 79 08/05/2015              Current medicines are reviewed at length with the patient today.  The patient does not have concerns regarding medicines.  The following changes have been made: Lasix increased to 20 bid  Labs/ tests ordered today include: BMET 4 weeks   No orders of the defined types were placed in this encounter.    Jenkins Rouge

## 2015-10-03 NOTE — Patient Instructions (Addendum)
Medication Instructions:  Your physician has recommended you make the following change in your medication:  1- Increase Lasix 20 mg by mouth twice daily  Labwork: NONE  Testing/Procedures: NONE  Follow-Up: Your physician wants you to follow-up in: 6 months with Dr. [name]. You will receive a reminder letter in the mail two months in advance. If you don't receive a letter, please call our office to schedule the follow-up appointment.  You have been referred to wound clinic. They will call you with an appointment.  If you need a refill on your cardiac medications before your next appointment, please call your pharmacy.

## 2015-10-06 ENCOUNTER — Encounter: Payer: PPO | Attending: Surgery | Admitting: Surgery

## 2015-10-06 DIAGNOSIS — E785 Hyperlipidemia, unspecified: Secondary | ICD-10-CM | POA: Diagnosis not present

## 2015-10-06 DIAGNOSIS — M109 Gout, unspecified: Secondary | ICD-10-CM | POA: Insufficient documentation

## 2015-10-06 DIAGNOSIS — I350 Nonrheumatic aortic (valve) stenosis: Secondary | ICD-10-CM | POA: Insufficient documentation

## 2015-10-06 DIAGNOSIS — B49 Unspecified mycosis: Secondary | ICD-10-CM | POA: Insufficient documentation

## 2015-10-06 DIAGNOSIS — I890 Lymphedema, not elsewhere classified: Secondary | ICD-10-CM | POA: Diagnosis not present

## 2015-10-06 DIAGNOSIS — L03116 Cellulitis of left lower limb: Secondary | ICD-10-CM | POA: Insufficient documentation

## 2015-10-06 DIAGNOSIS — I10 Essential (primary) hypertension: Secondary | ICD-10-CM | POA: Insufficient documentation

## 2015-10-07 ENCOUNTER — Telehealth: Payer: Self-pay | Admitting: Cardiovascular Disease

## 2015-10-07 NOTE — Telephone Encounter (Signed)
Pt had an infection in leg was told to go to the wound center-he was seen yesterday and they said to continue med from PCP that wound is not open so should heal soon-per pt already getting better

## 2015-10-07 NOTE — Progress Notes (Signed)
[name], [name] ([id number]) Visit Report for 10/06/2015 Abuse/Suicide Risk Screen Details Patient Name: [name], [name]. Date of Service: 10/06/2015 2:15 PM Medical Record Number: [id number] Patient Account Number: [id number] Date of Birth/Sex: March 02, 1941 (75 y.o. Male) Treating RN: Montey Hora Primary Care Physician: Penni Homans Other Clinician: Referring Physician: Jenkins Rouge Treating Physician/Extender: Frann Rider in Treatment: 0 Abuse/Suicide Risk Screen Items Answer ABUSE/SUICIDE RISK SCREEN: Has anyone close to you tried to hurt or harm you recentlyo No Do you feel uncomfortable with anyone in your familyo No Has anyone forced you do things that you didnot want to doo No Do you have any thoughts of harming yourselfo No Patient displays signs or symptoms of abuse and/or neglect. No Electronic Signature(s) Signed: 10/06/2015 5:02:46 PM By: Montey Hora Entered By: Montey Hora on 10/06/2015 15:02:31 [name] ([id number]) -------------------------------------------------------------------------------- Activities of Daily Living Details Patient Name: Lance Ras D. Date of Service: 10/06/2015 2:15 PM Medical Record Number: [id number] Patient Account Number: [id number] Date of Birth/Sex: August 20, 1941 (75 y.o. Male) Treating RN: Montey Hora Primary Care Physician: Penni Homans Other Clinician: Referring Physician: Jenkins Rouge Treating Physician/Extender: Frann Rider in Treatment: 0 Activities of Daily Living Items Answer Activities of Daily Living (Please select one for each item) Drive Automobile Not Able Take Medications Completely Able Use Telephone Completely Able Care for Appearance Completely Able Use Toilet Completely Able Bath / Shower Need Assistance Dress Self Need Assistance Feed Self Completely Able Walk Need Assistance Get In / Out Bed Need Assistance Housework Not Able Prepare Meals Need Assistance Handle Money  Completely Able Shop for Self Need Assistance Electronic Signature(s) Signed: 10/06/2015 5:02:46 PM By: Montey Hora Entered By: Montey Hora on 10/06/2015 15:03:36 [name] ([id number]) -------------------------------------------------------------------------------- Education Assessment Details Patient Name: Lance Ras D. Date of Service: 10/06/2015 2:15 PM Medical Record Number: [id number] Patient Account Number: [id number] Date of Birth/Sex: April 18, 1941 (75 y.o. Male) Treating RN: Montey Hora Primary Care Physician: Penni Homans Other Clinician: Referring Physician: Jenkins Rouge Treating Physician/Extender: Frann Rider in Treatment: 0 Primary Learner Assessed: Patient Learning Preferences/Education Level/Primary Language Learning Preference: Explanation, Demonstration Highest Education Level: College or Above Preferred Language: English Cognitive Barrier Assessment/Beliefs Language Barrier: No Translator Needed: No Memory Deficit: No Emotional Barrier: No Cultural/Religious Beliefs Affecting Medical No Care: Physical Barrier Assessment Impaired Vision: No Impaired Hearing: No Decreased Hand dexterity: No Knowledge/Comprehension Assessment Knowledge Level: Medium Comprehension Level: Medium Ability to understand written Medium instructions: Ability to understand verbal Medium instructions: Motivation Assessment Anxiety Level: Calm Cooperation: Cooperative Education Importance: Acknowledges Need Interest in Health Problems: Asks Questions Perception: Coherent Willingness to Engage in Self- Medium Management Activities: Readiness to Engage in Self- Medium Management Activities: Electronic Signature(s) [name], [name] ([id number]) Signed: 10/06/2015 5:02:46 PM By: Montey Hora Entered By: Montey Hora on 10/06/2015 15:04:19 [name]  ([id number]) -------------------------------------------------------------------------------- Fall Risk Assessment Details Patient Name: Lance Ras D. Date of Service: 10/06/2015 2:15 PM Medical Record Number: [id number] Patient Account Number: [id number] Date of Birth/Sex: May 30, 1941 (75 y.o. Male) Treating RN: Montey Hora Primary Care Physician: Penni Homans Other Clinician: Referring Physician: Jenkins Rouge Treating Physician/Extender: Frann Rider in Treatment: 0 Fall Risk Assessment Items Have you had 2 or more falls in the last 12 monthso 0 No Have you had any fall that resulted in injury in the last 12 monthso 0 No FALL RISK ASSESSMENT: History of falling - immediate or within 3 months 0 No Secondary diagnosis 0 No Ambulatory aid None/bed rest/wheelchair/nurse 0 Yes Crutches/cane/walker 0 No Furniture  0 No IV Access/Saline Lock 0 No Gait/Training Normal/bed rest/immobile 0 No Weak 10 Yes Impaired 0 No Mental Status Oriented to own ability 0 Yes Electronic Signature(s) Signed: 10/06/2015 5:02:46 PM By: Montey Hora Entered By: Montey Hora on 10/06/2015 15:04:44 Lance Ras D. ([id number]) -------------------------------------------------------------------------------- Foot Assessment Details Patient Name: Lance Ras D. Date of Service: 10/06/2015 2:15 PM Medical Record Number: [id number] Patient Account Number: [id number] Date of Birth/Sex: 1941/06/25 (75 y.o. Male) Treating RN: Montey Hora Primary Care Physician: Penni Homans Other Clinician: Referring Physician: Jenkins Rouge Treating Physician/Extender: Frann Rider in Treatment: 0 Foot Assessment Items Site Locations + = Sensation present, - = Sensation absent, C = Callus, U = Ulcer R = Redness, W = Warmth, M = Maceration, PU = Pre-ulcerative lesion F = Fissure, S = Swelling, D = Dryness Assessment Right: Left: Other Deformity: No No Prior Foot Ulcer: No No Prior Amputation:  No No Charcot Joint: No No Ambulatory Status: Ambulatory With Help Assistance Device: Wheelchair Gait: Steady Electronic Signature(s) Signed: 10/06/2015 5:02:46 PM By: Montey Hora Entered By: Montey Hora on 10/06/2015 15:05:20 [name] ([id number]) -------------------------------------------------------------------------------- Nutrition Risk Assessment Details Patient Name: Lance Ras D. Date of Service: 10/06/2015 2:15 PM Medical Record Number: [id number] Patient Account Number: [id number] Date of Birth/Sex: 09/17/1941 (75 y.o. Male) Treating RN: Montey Hora Primary Care Physician: Penni Homans Other Clinician: Referring Physician: Jenkins Rouge Treating Physician/Extender: Frann Rider in Treatment: 0 Height (in): 74 Weight (lbs): 380 Body Mass Index (BMI): 48.8 Nutrition Risk Assessment Items NUTRITION RISK SCREEN: I have an illness or condition that made me change the kind and/or 0 No amount of food I eat I eat fewer than two meals per day 0 No I eat few fruits and vegetables, or milk products 0 No I have three or more drinks of beer, liquor or wine almost every day 0 No I have tooth or mouth problems that make it hard for me to eat 0 No I don't always have enough money to buy the food I need 0 No I eat alone most of the time 0 No I take three or more different prescribed or over-the-counter drugs a 1 Yes day Without wanting to, I have lost or gained 10 pounds in the last six 0 No months I am not always physically able to shop, cook and/or feed myself 0 No Nutrition Protocols Good Risk Protocol 0 No interventions needed Moderate Risk Protocol Electronic Signature(s) Signed: 10/06/2015 5:02:46 PM By: Montey Hora Entered By: Montey Hora on 10/06/2015 15:04:58

## 2015-10-07 NOTE — Progress Notes (Signed)
GRADEN, SANDMEYER ([id number]) Visit Report for 10/06/2015 Allergy List Details Patient Name: [name], [name]. Date of Service: 10/06/2015 2:15 PM Medical Record Number: [id number] Patient Account Number: [id number] Date of Birth/Sex: 1941/07/13 (75 y.o. Male) Treating RN: Montey Hora Primary Care Physician: Penni Homans Other Clinician: Referring Physician: Jenkins Rouge Treating Physician/Extender: Frann Rider in Treatment: 0 Allergies Active Allergies warfarin Allergy Notes Electronic Signature(s) Signed: 10/06/2015 5:02:46 PM By: Montey Hora Entered By: Montey Hora on 10/06/2015 14:56:58 [name] ([id number]) -------------------------------------------------------------------------------- Arrival Information Details Patient Name: Lance Ras D. Date of Service: 10/06/2015 2:15 PM Medical Record Number: [id number] Patient Account Number: [id number] Date of Birth/Sex: 08-06-1941 (75 y.o. Male) Treating RN: Montey Hora Primary Care Physician: Penni Homans Other Clinician: Referring Physician: Jenkins Rouge Treating Physician/Extender: Frann Rider in Treatment: 0 Visit Information Patient Arrived: Wheel Chair Arrival Time: 14:53 Accompanied By: spouse Transfer Assistance: None Patient Identification Verified: Yes Secondary Verification Process Yes Completed: Electronic Signature(s) Signed: 10/06/2015 5:02:46 PM By: Montey Hora Entered By: Montey Hora on 10/06/2015 14:54:06 [name] ([id number]) -------------------------------------------------------------------------------- Clinic Level of Care Assessment Details Patient Name: Lance Ras D. Date of Service: 10/06/2015 2:15 PM Medical Record Number: [id number] Patient Account Number: [id number] Date of Birth/Sex: 02-02-1941 (75 y.o. Male) Treating RN: Montey Hora Primary Care Physician: Penni Homans Other Clinician: Referring Physician: Jenkins Rouge Treating  Physician/Extender: Frann Rider in Treatment: 0 Clinic Level of Care Assessment Items TOOL 2 Quantity Score []  - Use when only an EandM is performed on the INITIAL visit 0 ASSESSMENTS - Nursing Assessment / Reassessment X - General Physical Exam (combine w/ comprehensive assessment (listed just 1 20 below) when performed on new pt. evals) X - Comprehensive Assessment (HX, ROS, Risk Assessments, Wounds Hx, etc.) 1 25 ASSESSMENTS - Wound and Skin Assessment / Reassessment []  - Simple Wound Assessment / Reassessment - one wound 0 []  - Complex Wound Assessment / Reassessment - multiple wounds 0 []  - Dermatologic / Skin Assessment (not related to wound area) 0 ASSESSMENTS - Ostomy and/or Continence Assessment and Care []  - Incontinence Assessment and Management 0 []  - Ostomy Care Assessment and Management (repouching, etc.) 0 PROCESS - Coordination of Care X - Simple Patient / Family Education for ongoing care 1 15 []  - Complex (extensive) Patient / Family Education for ongoing care 0 X - Staff obtains Programmer, systems, Records, Test Results / Process Orders 1 10 []  - Staff telephones HHA, Nursing Homes / Clarify orders / etc 0 []  - Routine Transfer to another Facility (non-emergent condition) 0 []  - Routine Hospital Admission (non-emergent condition) 0 []  - New Admissions / Biomedical engineer / Ordering NPWT, Apligraf, etc. 0 []  - Emergency Hospital Admission (emergent condition) 0 X - Simple Discharge Coordination 1 10 [name], Lance D. ([id number]) []  - Complex (extensive) Discharge Coordination 0 PROCESS - Special Needs []  - Pediatric / Minor Patient Management 0 []  - Isolation Patient Management 0 []  - Hearing / Language / Visual special needs 0 []  - Assessment of Community assistance (transportation, D/C planning, etc.) 0 []  - Additional assistance / Altered mentation 0 []  - Support Surface(s) Assessment (bed, cushion, seat, etc.) 0 INTERVENTIONS - Wound Cleansing /  Measurement []  - Wound Imaging (photographs - any number of wounds) 0 []  - Wound Tracing (instead of photographs) 0 []  - Simple Wound Measurement - one wound 0 []  - Complex Wound Measurement - multiple wounds 0 []  - Simple Wound Cleansing - one wound 0 []  - Complex Wound Cleansing - multiple wounds  0 INTERVENTIONS - Wound Dressings []  - Small Wound Dressing one or multiple wounds 0 []  - Medium Wound Dressing one or multiple wounds 0 []  - Large Wound Dressing one or multiple wounds 0 []  - Application of Medications - injection 0 INTERVENTIONS - Miscellaneous []  - External ear exam 0 []  - Specimen Collection (cultures, biopsies, blood, body fluids, etc.) 0 []  - Specimen(s) / Culture(s) sent or taken to Lab for analysis 0 []  - Patient Transfer (multiple staff / Harrel Lemon Lift / Similar devices) 0 []  - Simple Staple / Suture removal (25 or less) 0 []  - Complex Staple / Suture removal (26 or more) 0 [name], Lance D. ([id number]) []  - Hypo / Hyperglycemic Management (close monitor of Blood Glucose) 0 []  - Ankle / Brachial Index (ABI) - do not check if billed separately 0 Has the patient been seen at the hospital within the last three years: Yes Total Score: 80 Level Of Care: New/Established - Level 3 Electronic Signature(s) Signed: 10/06/2015 4:59:48 PM By: Montey Hora Entered By: Montey Hora on 10/06/2015 16:59:48 [name] ([id number]) -------------------------------------------------------------------------------- Encounter Discharge Information Details Patient Name: Lance Ras D. Date of Service: 10/06/2015 2:15 PM Medical Record Number: [id number] Patient Account Number: [id number] Date of Birth/Sex: 02/02/1941 (75 y.o. Male) Treating RN: Montey Hora Primary Care Physician: Penni Homans Other Clinician: Referring Physician: Jenkins Rouge Treating Physician/Extender: Frann Rider in Treatment: 0 Encounter Discharge Information Items Discharge Pain Level:  0 Discharge Condition: Stable Ambulatory Status: Wheelchair Discharge Destination: Home Transportation: Private Auto Accompanied By: spouse Schedule Follow-up Appointment: No Medication Reconciliation completed and provided to Patient/Care No [name]: Provided on Clinical Summary of Care: 10/06/2015 Form Type Recipient Paper Patient WB Electronic Signature(s) Signed: 10/06/2015 3:52:26 PM By: Ruthine Dose Entered By: Ruthine Dose on 10/06/2015 15:52:26 Lance Ras D. ([id number]) -------------------------------------------------------------------------------- Lower Extremity Assessment Details Patient Name: Lance Ras D. Date of Service: 10/06/2015 2:15 PM Medical Record Number: [id number] Patient Account Number: [id number] Date of Birth/Sex: 01/21/1941 (75 y.o. Male) Treating RN: Montey Hora Primary Care Physician: Penni Homans Other Clinician: Referring Physician: Jenkins Rouge Treating Physician/Extender: Frann Rider in Treatment: 0 Edema Assessment Assessed: [Left: No] [Right: No] Edema: [Left: Yes] [Right: Yes] Vascular Assessment Pulses: Posterior Tibial Palpable: [Right:No] Doppler: [Left:Multiphasic] [Right:Multiphasic] Dorsalis Pedis Palpable: [Left:Yes] [Right:Yes] Doppler: [Left:Multiphasic] [Right:Multiphasic] Extremity colors, hair growth, and conditions: Extremity Color: [Left:Hyperpigmented] [Right:Hyperpigmented] Hair Growth on Extremity: [Left:No] [Right:No] Temperature of Extremity: [Left:Hot] [Right:Warm] Capillary Refill: [Left:< 3 seconds] [Right:< 3 seconds] Toe Nail Assessment Left: Right: Thick: Yes Yes Discolored: Yes Yes Deformed: Yes Yes Improper Length and Hygiene: No No Electronic Signature(s) Signed: 10/06/2015 5:02:46 PM By: Montey Hora Entered By: Montey Hora on 10/06/2015 15:32:30 [name] ([id number]) -------------------------------------------------------------------------------- Patient/Caregiver  Education Details Patient Name: Lance Ras D. Date of Service: 10/06/2015 2:15 PM Medical Record Number: [id number] Patient Account Number: [id number] Date of Birth/Gender: Feb 12, 1941 (75 y.o. Male) Treating RN: Montey Hora Primary Care Physician: Penni Homans Other Clinician: Referring Physician: Jenkins Rouge Treating Physician/Extender: Frann Rider in Treatment: 0 Education Assessment Education Provided To: Patient and Caregiver Education Topics Provided Basic Hygiene: Handouts: Other: skin care of fungal infection Methods: Explain/Verbal Responses: State content correctly Electronic Signature(s) Signed: 10/06/2015 5:02:46 PM By: Montey Hora Entered By: Montey Hora on 10/06/2015 15:43:13 [name], Lance Jupiter D. ([id number]) -------------------------------------------------------------------------------- Wasta Details Patient Name: Lance Ras D. Date of Service: 10/06/2015 2:15 PM Medical Record Number: [id number] Patient Account Number: [id number] Date of Birth/Sex: August 11, 1941 (75 y.o. Male) Treating RN: Montey Hora Primary Care Physician: Charlett Blake,  STACEY Other Clinician: Referring Physician: Jenkins Rouge Treating Physician/Extender: Frann Rider in Treatment: 0 Vital Signs Time Taken: 14:54 Temperature (F): 98.4 Height (in): 74 Pulse (bpm): 89 Source: Stated Respiratory Rate (breaths/min): 18 Weight (lbs): 380 Blood Pressure (mmHg): 126/71 Source: Stated Reference Range: 80 - 120 mg / dl Body Mass Index (BMI): 48.8 Electronic Signature(s) Signed: 10/06/2015 5:02:46 PM By: Montey Hora Entered By: Montey Hora on 10/06/2015 14:56:16

## 2015-10-07 NOTE — Progress Notes (Signed)
OSWELL, BRACKIN ([id number]) Visit Report for 10/06/2015 Chief Complaint Document Details Patient Name: [name], [name]. Date of Service: 10/06/2015 2:15 PM Medical Record Number: [id number] Patient Account Number: [id number] Date of Birth/Sex: [birth year] (75 y.o. Male) Treating RN: Montey Hora Primary Care Physician: Penni Homans Other Clinician: Referring Physician: Jenkins Rouge Treating Physician/Extender: Frann Rider in Treatment: 0 Information Obtained from: Patient Chief Complaint Patient presents to the wound care center for a consult due 2 swelling of his left lower extremity and redness near his knee which was thought to be a fungal infection. Electronic Signature(s) Signed: 10/06/2015 4:26:32 PM By: Christin Fudge MD, FACS Entered By: Christin Fudge on 10/06/2015 16:26:32 [name] ([id number]) -------------------------------------------------------------------------------- HPI Details Patient Name: Lance Ras D. Date of Service: 10/06/2015 2:15 PM Medical Record Number: [id number] Patient Account Number: [id number] Date of Birth/Sex: [birth year] (75 y.o. Male) Treating RN: Montey Hora Primary Care Physician: Penni Homans Other Clinician: Referring Physician: Jenkins Rouge Treating Physician/Extender: Frann Rider in Treatment: 0 History of Present Illness HPI Description: This 75 year old morbidly obese gentleman with mild hypertension, hyperlipidemia, gout and severe aortic stenosis was recently taken up for a TAVR, in September 2016 and was seen by his cardiologist on 10/03/2015 for routine follow-up. The cardiologist referred the patient to the wound center as the patient had a cellulitis of his left lower extremity and was concerned as the patient is at high risk for SBE with a new prosthetic valve. The patient has no open wounds nor dose he have any active evidence of weeping from his chronic lower extremity lymphedema. Mode and distend his  PCP Dr. Randel Pigg had put him on Keflex and Diflucan and he is continuing these. Since the patient wears a knee brace chronically he had developed a fungal infection in the region of his lower extremities especially around the knee on the left side. Of note the patient has had lymphedema both lower extremities for a long while but has not had any active treatment.there is a remote workup done in 2009 with Dr. Kellie Simmering had seen him for arterial and venous duplex studies and at that stage he had normal ABIs and triphasic Doppler waveforms both lower extremities and his venous duplex had revealed reflux in the right greater saphenous vein at the saphenofemoral junction through the mid thigh level and also reflux in the left saphenofemoral junction and bilateral common femoral veins. No vascular procedure was offered recommended Electronic Signature(s) Signed: 10/06/2015 4:34:31 PM By: Christin Fudge MD, FACS Entered By: Christin Fudge on 10/06/2015 16:34:31 [name] ([id number]) -------------------------------------------------------------------------------- Physical Exam Details Patient Name: Lance Ras D. Date of Service: 10/06/2015 2:15 PM Medical Record Number: [id number] Patient Account Number: [id number] Date of Birth/Sex: October 10, 1940 (75 y.o. Male) Treating RN: Montey Hora Primary Care Physician: Penni Homans Other Clinician: Referring Physician: Jenkins Rouge Treating Physician/Extender: Frann Rider in Treatment: 0 Constitutional . Pulse regular. Respirations normal and unlabored. Afebrile. . Eyes Nonicteric. Reactive to light. Ears, Nose, Mouth, and Throat Lips, teeth, and gums WNL.[name] Moist mucosa without lesions. Neck supple and nontender. No palpable supraclavicular or cervical adenopathy. Normal sized without goiter. Respiratory WNL. No retractions.. Breath sounds WNL, No rubs, rales, rhonchi, or wheeze.. Cardiovascular Pedal Pulses WNL. ABIs could not be measured  as the patient could not get out of his electric chair and this was technically not possible. he has bilateral lower extremity stage II lymphedema with stigmata of possible venous hypertension 2.. Gastrointestinal (GI) Abdomen without masses or tenderness.. No liver or spleen  enlargement or tenderness.. Lymphatic No adneopathy. No adenopathy. No adenopathy. Musculoskeletal Adexa without tenderness or enlargement.. Digits and nails w/o clubbing, cyanosis, infection, petechiae, ischemia, or inflammatory conditions.. Integumentary (Hair, Skin) No suspicious lesions. No crepitus or fluctuance. No peri-wound warmth or erythema. No masses.[name] Psychiatric Judgement and insight Intact.. No evidence of depression, anxiety, or agitation.. Notes in the region of the left knee and lower extremity there are no open ulcerations of wound but he does have a fungus infection of his skin. Electronic Signature(s) Signed: 10/06/2015 4:35:58 PM By: Christin Fudge MD, FACS Entered By: Christin Fudge on 10/06/2015 16:35:57 [name] ([id number]) -------------------------------------------------------------------------------- Physician Orders Details Patient Name: Lance Ras D. Date of Service: 10/06/2015 2:15 PM Medical Record Number: [id number] Patient Account Number: [id number] Date of Birth/Sex: [birth year] (75 y.o. Male) Treating RN: Montey Hora Primary Care Physician: Penni Homans Other Clinician: Referring Physician: Jenkins Rouge Treating Physician/Extender: Frann Rider in Treatment: 0 Verbal / Phone Orders: Yes Clinician: Montey Hora Read Back and Verified: Yes Diagnosis Coding Discharge From [facility] Services o Discharge from [place] - consult only Electronic Signature(s) Signed: 10/06/2015 4:49:11 PM By: Christin Fudge MD, FACS Signed: 10/06/2015 5:02:46 PM By: Montey Hora Entered By: Montey Hora on 10/06/2015 15:37:08 [name]  ([id number]) -------------------------------------------------------------------------------- Problem List Details Patient Name: Lance Ras D. Date of Service: 10/06/2015 2:15 PM Medical Record Number: [id number] Patient Account Number: [id number] Date of Birth/Sex: 1940-09-30 (75 y.o. Male) Treating RN: Montey Hora Primary Care Physician: Penni Homans Other Clinician: Referring Physician: Jenkins Rouge Treating Physician/Extender: Frann Rider in Treatment: 0 Active Problems ICD-10 Encounter Code Description Active Date Diagnosis B49 Unspecified mycosis 10/06/2015 Yes I89.0 Lymphedema, not elsewhere classified 10/06/2015 Yes E66.01 Morbid (severe) obesity due to excess calories 10/06/2015 Yes Inactive Problems Resolved Problems Electronic Signature(s) Signed: 10/06/2015 4:25:33 PM By: Christin Fudge MD, FACS Entered By: Christin Fudge on 10/06/2015 16:25:33 [name] ([id number]) -------------------------------------------------------------------------------- Progress Note Details Patient Name: Lance Ras D. Date of Service: 10/06/2015 2:15 PM Medical Record Number: [id number] Patient Account Number: [id number] Date of Birth/Sex: 1941-05-24 (75 y.o. Male) Treating RN: Montey Hora Primary Care Physician: Penni Homans Other Clinician: Referring Physician: Jenkins Rouge Treating Physician/Extender: Frann Rider in Treatment: 0 Subjective Chief Complaint Information obtained from Patient Patient presents to the wound care center for a consult due 2 swelling of his left lower extremity and redness near his knee which was thought to be a fungal infection. History of Present Illness (HPI) This 75 year old morbidly obese gentleman with mild hypertension, hyperlipidemia, gout and severe aortic stenosis was recently taken up for a TAVR, in September 2016 and was seen by his cardiologist on 10/03/2015 for routine follow-up. The cardiologist referred the  patient to the wound center as the patient had a cellulitis of his left lower extremity and was concerned as the patient is at high risk for SBE with a new prosthetic valve. The patient has no open wounds nor dose he have any active evidence of weeping from his chronic lower extremity lymphedema. Mode and distend his PCP Dr. Randel Pigg had put him on Keflex and Diflucan and he is continuing these. Since the patient wears a knee brace chronically he had developed a fungal infection in the region of his lower extremities especially around the knee on the left side. Of note the patient has had lymphedema both lower extremities for a long while but has not had any active treatment.there is a remote workup done in 2009 with Dr. Kellie Simmering had seen him  for arterial and venous duplex studies and at that stage he had normal ABIs and triphasic Doppler waveforms both lower extremities and his venous duplex had revealed reflux in the right greater saphenous vein at the saphenofemoral junction through the mid thigh level and also reflux in the left saphenofemoral junction and bilateral common femoral veins. No vascular procedure was offered recommended Patient History Allergies warfarin Family History No family history of Cancer, Diabetes, Heart Disease, Hereditary Spherocytosis, Hypertension, Kidney Disease, Lung Disease, Seizures, Stroke, Thyroid Problems, Tuberculosis. Social History Former smoker - 30+ years, Marital Status - Married, Alcohol Use - Never, Drug Use - No History, Caffeine Use - Never. [name], [name] ([id number]) Medical History Eyes Patient has history of Cataracts Respiratory Patient has history of Sleep Apnea Cardiovascular Patient has history of Deep Vein Thrombosis, Hypertension Musculoskeletal Patient has history of Gout Neurologic Patient has history of Neuropathy Oncologic Denies history of Received Chemotherapy, Received Radiation Psychiatric Denies history of  Anorexia/bulimia, Confinement Anxiety Hospitalization/Surgery History - 09/30/2015, [place], valve replacement. Medical And Surgical History Notes Cardiovascular heart valve replacement Jan 2017 Neurologic peroneal palsy - right foot drop Review of Systems (ROS) Constitutional Symptoms (General Health) The patient has no complaints or symptoms. Eyes Complains or has symptoms of Glasses / Contacts - glasses. Ear/Nose/Mouth/Throat The patient has no complaints or symptoms. Hematologic/Lymphatic The patient has no complaints or symptoms. Cardiovascular Complains or has symptoms of LE edema. Gastrointestinal The patient has no complaints or symptoms. Endocrine The patient has no complaints or symptoms. Genitourinary The patient has no complaints or symptoms. Immunological The patient has no complaints or symptoms. Integumentary (Skin) The patient has no complaints or symptoms. Musculoskeletal The patient has no complaints or symptoms. Neurologic The patient has no complaints or symptoms. Oncologic The patient has no complaints or symptoms. [name], [name] ([id number]) Psychiatric The patient has no complaints or symptoms. Medications aspirin 81 mg tablet,delayed release oral 1 1 tablet,delayed release (DR/EC) oral acetaminophen 500 mg tablet oral 1 1 tablet oral tramadol 50 mg tablet oral 1 1 tablet oral gabapentin 300 mg capsule oral 1 1 capsule oral fluconazole 150 mg tablet oral 1 1 tablet oral loratadine 10 mg tablet oral 1 1 tablet oral atorvastatin 40 mg tablet oral 1 1 tablet oral lisinopril 10 mg tablet oral 1 1 tablet oral cephalexin 500 mg capsule oral 1 1 capsule oral coenzyme Q10 200 mg capsule oral 1 1 capsule oral allopurinol 100 mg tablet oral 1 1 tablet oral Muscle Rub 15 %-10 % topical cream topical cream topical Miralax 17 gram oral powder packet oral 1 1 powder in packet oral sennosides-docusate sodium 8.6 mg-50 mg tablet oral 1 1 tablet oral furosemide  20 mg tablet oral 1 1 tablet oral multivitamin with minerals tablet oral 1 1 tablet oral clopidogrel 75 mg tablet oral 1 1 tablet oral Anusol-HC 25 mg rectal suppository rectal 1 1 suppository rectal cholecalciferol (vitamin D3) 1,000 unit tablet oral 2 2 tablet oral Objective Constitutional Pulse regular. Respirations normal and unlabored. Afebrile. Vitals Time Taken: 2:54 PM, Height: 74 in, Source: Stated, Weight: 380 lbs, Source: Stated, BMI: 48.8, Temperature: 98.4 F, Pulse: 89 bpm, Respiratory Rate: 18 breaths/min, Blood Pressure: 126/71 mmHg. Eyes Nonicteric. Reactive to light. Ears, Nose, Mouth, and Throat Lips, teeth, and gums WNL.[name] Moist mucosa without lesions. Neck supple and nontender. No palpable supraclavicular or cervical adenopathy. Normal sized without goiter. [name], Lance D. ([id number]) Respiratory WNL. No retractions.. Breath sounds WNL, No rubs, rales, rhonchi, or wheeze.. Cardiovascular Pedal Pulses  WNL. ABIs could not be measured as the patient could not get out of his electric chair and this was technically not possible. he has bilateral lower extremity stage II lymphedema with stigmata of possible venous hypertension 2.. Gastrointestinal (GI) Abdomen without masses or tenderness.. No liver or spleen enlargement or tenderness.. Lymphatic No adneopathy. No adenopathy. No adenopathy. Musculoskeletal Adexa without tenderness or enlargement.. Digits and nails w/o clubbing, cyanosis, infection, petechiae, ischemia, or inflammatory conditions.[name] Psychiatric Judgement and insight Intact.. No evidence of depression, anxiety, or agitation.. General Notes: in the region of the left knee and lower extremity there are no open ulcerations of wound but he does have a fungus infection of his skin. Integumentary (Hair, Skin) No suspicious lesions. No crepitus or fluctuance. No peri-wound warmth or erythema. No masses.. Assessment Active Problems ICD-10 B49 - Unspecified  mycosis I89.0 - Lymphedema, not elsewhere classified E66.01 - Morbid (severe) obesity due to excess calories This 75 year old morbidly obese gentleman with several other comorbidities was here for opinion regarding a fungal infection and cellulitis having being treated for this by his PCP with appropriate antifungals and antibiotics. [name], [name] ([id number]) The patient does not have active open ulcerations anywhere on his lower extremities but does have previous stigmata of lymphedema and or chronic venous disease. At this stage I have asked him to continue with his PCP regarding appropriate management of his skin condition and we would be happy to see him back for formal workup of his lymphedema and venous hypertension at any time he is ready. he and his wife had all questions answered to their satisfaction Plan Discharge From [facility]: Discharge from [place] - consult only This 75 year old morbidly obese gentleman with several other comorbidities was here for opinion regarding a fungal infection and cellulitis having being treated for this by his PCP with appropriate antifungals and antibiotics. The patient does not have active open ulcerations anywhere on his lower extremities but does have previous stigmata of lymphedema and or chronic venous disease. At this stage I have asked him to continue with his PCP regarding appropriate management of his skin condition and we would be happy to see him back for formal workup of his lymphedema and venous hypertension at any time he is ready. he and his wife had all questions answered to their satisfaction Electronic Signature(s) Signed: 10/06/2015 4:37:51 PM By: Christin Fudge MD, FACS Entered By: Christin Fudge on 10/06/2015 16:37:51 [name] ([id number]) -------------------------------------------------------------------------------- ROS/PFSH Details Patient Name: Lance Ras D. Date of Service: 10/06/2015 2:15  PM Medical Record Number: [id number] Patient Account Number: [id number] Date of Birth/Sex: 01/20/1941 (75 y.o. Male) Treating RN: Montey Hora Primary Care Physician: Penni Homans Other Clinician: Referring Physician: Jenkins Rouge Treating Physician/Extender: Frann Rider in Treatment: 0 Wound History Eyes Complaints and Symptoms: Positive for: Glasses / Contacts - glasses Medical History: Positive for: Cataracts Cardiovascular Complaints and Symptoms: Positive for: LE edema Medical History: Positive for: Deep Vein Thrombosis; Hypertension Past Medical History Notes: heart valve replacement Jan 2017 Psychiatric Complaints and Symptoms: No Complaints or Symptoms Complaints and Symptoms: Negative for: Anxiety; Claustrophobia Medical History: Negative for: Anorexia/bulimia; Confinement Anxiety Constitutional Symptoms (General Health) Complaints and Symptoms: No Complaints or Symptoms Ear/Nose/Mouth/Throat Complaints and Symptoms: No Complaints or Symptoms Hematologic/Lymphatic [name], [name] ([id number]) Complaints and Symptoms: No Complaints or Symptoms Respiratory Medical History: Positive for: Sleep Apnea Gastrointestinal Complaints and Symptoms: No Complaints or Symptoms Endocrine Complaints and Symptoms: No Complaints or Symptoms Genitourinary Complaints and Symptoms: No Complaints or Symptoms  Immunological Complaints and Symptoms: No Complaints or Symptoms Integumentary (Skin) Complaints and Symptoms: No Complaints or Symptoms Musculoskeletal Complaints and Symptoms: No Complaints or Symptoms Medical History: Positive for: Gout Neurologic Complaints and Symptoms: No Complaints or Symptoms Medical History: Positive for: Neuropathy Past Medical History Notes: peroneal palsy - right foot drop Oncologic [name], [name] ([id number]) Complaints and Symptoms: No Complaints or Symptoms Medical History: Negative for: Received Chemotherapy;  Received Radiation HBO Extended History Items Eyes: Cataracts Hospitalization / Surgery History Name of Hospital Purpose of Hospitalization/Surgery Date [facility] valve replacement 09/30/2015 Family and Social History Cancer: No; Diabetes: No; Heart Disease: No; Hereditary Spherocytosis: No; Hypertension: No; Kidney Disease: No; Lung Disease: No; Seizures: No; Stroke: No; Thyroid Problems: No; Tuberculosis: No; Former smoker - 30+ years; Marital Status - Married; Alcohol Use: Never; Drug Use: No History; Caffeine Use: Never; Financial Concerns: No; Food, Clothing or Shelter Needs: No; Support System Lacking: No; Transportation Concerns: No; Advanced Directives: No; Patient does not want information on Advanced Directives Physician Affirmation I have reviewed and agree with the above information. Electronic Signature(s) Signed: 10/06/2015 3:28:23 PM By: Christin Fudge MD, FACS Signed: 10/06/2015 5:02:46 PM By: Montey Hora Entered By: Christin Fudge on 10/06/2015 15:28:23 [name] ([id number]) -------------------------------------------------------------------------------- SuperBill Details Patient Name: Lance Ras D. Date of Service: 10/06/2015 Medical Record Number: [id number] Patient Account Number: [id number] Date of Birth/Sex: Jul 21, 1941 (75 y.o. Male) Treating RN: Montey Hora Primary Care Physician: Penni Homans Other Clinician: Referring Physician: Jenkins Rouge Treating Physician/Extender: Frann Rider in Treatment: 0 Diagnosis Coding ICD-10 Codes Code Description B49 Unspecified mycosis I89.0 Lymphedema, not elsewhere classified E66.01 Morbid (severe) obesity due to excess calories Facility Procedures CPT4 Code: [id number] Description: 99213 - WOUND CARE VISIT-LEV 3 EST PT Modifier: Quantity: 1 Physician Procedures CPT4 Code: [id number] Description: WC PHYS LEVEL 3 o NEW PT ICD-10 Description Diagnosis B49 Unspecified mycosis I89.0 Lymphedema, not elsewhere  classified E66.01 Morbid (severe) obesity due to excess calor Modifier: ies Quantity: 1 Electronic Signature(s) Signed: 10/06/2015 4:59:57 PM By: Montey Hora Previous Signature: 10/06/2015 4:48:33 PM Version By: Christin Fudge MD, FACS Previous Signature: 10/06/2015 4:38:04 PM Version By: Christin Fudge MD, FACS Entered By: Montey Hora on 10/06/2015 16:59:57

## 2015-10-08 ENCOUNTER — Other Ambulatory Visit: Payer: Self-pay | Admitting: Family Medicine

## 2015-10-09 NOTE — Telephone Encounter (Signed)
Faxed hardcopy for Tramadol to

## 2015-10-09 NOTE — Telephone Encounter (Signed)
[facility] [place]

## 2015-10-12 ENCOUNTER — Encounter: Payer: Self-pay | Admitting: Family Medicine

## 2015-10-12 DIAGNOSIS — L0390 Cellulitis, unspecified: Secondary | ICD-10-CM

## 2015-10-12 HISTORY — DX: Cellulitis, unspecified: L03.90

## 2015-10-12 NOTE — Assessment & Plan Note (Signed)
Struggles with chronic edema encouraged to minimize sodium and to stay as active as tolerated. Elevate feet above head

## 2015-10-12 NOTE — Assessment & Plan Note (Signed)
Started on antibiotics, keep legs clean and dry, clean with mild soap daily. Report if symptoms worsen.

## 2015-10-12 NOTE — Assessment & Plan Note (Signed)
Encouraged DASH diet, decrease po intake and increase exercise as tolerated. Needs 7-8 hours of sleep nightly. Avoid trans fats, eat small, frequent meals every 4-5 hours with lean proteins, complex carbs and healthy fats. Minimize simple carbs 

## 2015-10-12 NOTE — Assessment & Plan Note (Signed)
Has toleated valve replacement well

## 2015-10-14 ENCOUNTER — Telehealth: Payer: Self-pay | Admitting: Family Medicine

## 2015-10-14 MED ORDER — CEPHALEXIN 500 MG PO CAPS: 500.0000 mg | ORAL_CAPSULE | Freq: Four times a day (QID) | ORAL | Status: DC

## 2015-10-14 NOTE — Telephone Encounter (Signed)
OK to give one refill on the Keflex, same sig, same strength, same number

## 2015-10-14 NOTE — Telephone Encounter (Signed)
Relation to [id number] Call back number: [phone] Pharmacy: [id number] [place], [place] - [name] [phone] (Phone) [phone] (Fax)         Reason for call:  Patient states cellulites is improving and certain areas are warm to touch and patient finished last medication yesterday, requesting a refill.

## 2015-10-14 NOTE — Telephone Encounter (Signed)
Sent in antibiotic as requested. Called the patient informed prescription sent in.

## 2015-10-25 ENCOUNTER — Other Ambulatory Visit: Payer: Self-pay | Admitting: Family Medicine

## 2015-11-07 ENCOUNTER — Encounter: Payer: Self-pay | Admitting: Family Medicine

## 2015-11-07 ENCOUNTER — Ambulatory Visit (INDEPENDENT_AMBULATORY_CARE_PROVIDER_SITE_OTHER): Payer: PPO | Admitting: Family Medicine

## 2015-11-07 ENCOUNTER — Encounter (HOSPITAL_BASED_OUTPATIENT_CLINIC_OR_DEPARTMENT_OTHER): Payer: PPO

## 2015-11-07 DIAGNOSIS — I872 Venous insufficiency (chronic) (peripheral): Secondary | ICD-10-CM

## 2015-11-07 DIAGNOSIS — G4733 Obstructive sleep apnea (adult) (pediatric): Secondary | ICD-10-CM

## 2015-11-07 DIAGNOSIS — Z23 Encounter for immunization: Secondary | ICD-10-CM

## 2015-11-07 DIAGNOSIS — I350 Nonrheumatic aortic (valve) stenosis: Secondary | ICD-10-CM

## 2015-11-07 DIAGNOSIS — D649 Anemia, unspecified: Secondary | ICD-10-CM

## 2015-11-07 DIAGNOSIS — E7800 Pure hypercholesterolemia, unspecified: Secondary | ICD-10-CM | POA: Diagnosis not present

## 2015-11-07 LAB — COMPREHENSIVE METABOLIC PANEL
ALT: 22 U/L (ref 0–53)
AST: 25 U/L (ref 0–37)
Albumin: 3.9 g/dL (ref 3.5–5.2)
Alkaline Phosphatase: 73 U/L (ref 39–117)
BUN: 27 mg/dL — ABNORMAL HIGH (ref 6–23)
CO2: 29 mEq/L (ref 19–32)
Calcium: 9.4 mg/dL (ref 8.4–10.5)
Chloride: 101 mEq/L (ref 96–112)
Creatinine, Ser: 1.06 mg/dL (ref 0.40–1.50)
GFR: 72.73 mL/min (ref >60.00)
Glucose, Bld: 95 mg/dL (ref 70–99)
Potassium: 5.1 mEq/L (ref 3.5–5.1)
Sodium: 136 mEq/L (ref 135–145)
Total Bilirubin: 0.4 mg/dL (ref 0.2–1.2)
Total Protein: 6.7 g/dL (ref 6.0–8.3)

## 2015-11-07 LAB — LIPID PANEL
Cholesterol: 137 mg/dL (ref 0–200)
HDL: 29.5 mg/dL — ABNORMAL LOW (ref >39.00)
LDL Cholesterol: 78 mg/dL (ref 0–99)
NonHDL: 107.61
Total CHOL/HDL Ratio: 5
Triglycerides: 146 mg/dL (ref 0.0–149.0)
VLDL: 29.2 mg/dL (ref 0.0–40.0)

## 2015-11-07 LAB — URIC ACID: Uric Acid, Serum: 9 mg/dL — ABNORMAL HIGH (ref 4.0–7.8)

## 2015-11-07 LAB — CBC
HCT: 37 % — ABNORMAL LOW (ref 39.0–52.0)
Hemoglobin: 12 g/dL — ABNORMAL LOW (ref 13.0–17.0)
MCHC: 32.5 g/dL (ref 30.0–36.0)
MCV: 91.7 fl (ref 78.0–100.0)
Platelets: 144 10*3/uL — ABNORMAL LOW (ref 150.0–400.0)
RBC: 4.03 Mil/uL — ABNORMAL LOW (ref 4.22–5.81)
RDW: 17.5 % — ABNORMAL HIGH (ref 11.5–15.5)
WBC: 7.8 10*3/uL (ref 4.0–10.5)

## 2015-11-07 LAB — TSH: TSH: 1.31 u[IU]/mL (ref 0.35–4.50)

## 2015-11-07 NOTE — Assessment & Plan Note (Signed)
Stay well hydrated, monitor

## 2015-11-07 NOTE — Patient Instructions (Signed)

## 2015-11-07 NOTE — Assessment & Plan Note (Signed)
Tolerating statin, encouraged heart healthy diet, avoid trans fats, minimize simple carbs and saturated fats. Increase exercise as tolerated 

## 2015-11-07 NOTE — Progress Notes (Signed)
Pre visit review using our clinic review tool, if applicable. No additional management support is needed unless otherwise documented below in the visit note. 

## 2015-11-07 NOTE — Assessment & Plan Note (Signed)
Encouraged DASH diet, decrease po intake and increase exercise as tolerated. Needs 7-8 hours of sleep nightly. Avoid trans fats, eat small, frequent meals every 4-5 hours with lean proteins, complex carbs and healthy fats. Minimize simple carbs 

## 2015-11-10 ENCOUNTER — Other Ambulatory Visit: Payer: Self-pay | Admitting: Family Medicine

## 2015-11-10 MED ORDER — ALLOPURINOL 300 MG PO TABS: 300.0000 mg | ORAL_TABLET | Freq: Every day | ORAL | Status: DC

## 2015-11-10 NOTE — Telephone Encounter (Signed)
Refused patient medication refill too soon to fill.

## 2015-11-16 ENCOUNTER — Encounter: Payer: Self-pay | Admitting: Family Medicine

## 2015-11-16 NOTE — Assessment & Plan Note (Signed)
Using CPAP nightly 

## 2015-11-16 NOTE — Assessment & Plan Note (Signed)
Increase leafy greens, consider increased lean red meat and using cast iron cookware. Continue to monitor, report any concerns 

## 2015-11-16 NOTE — Progress Notes (Signed)
Patient ID: [name], male   DOB: 10/12/1940, 75 y.o.   MRN: [id number]   Subjective:    Patient ID: [name], male    DOB: [birth year], 75 y.o.   MRN: [id number]  Chief Complaint  Patient presents with  . Follow-up    HPI Patient is in today for follow up. He is accompanied by his wife. Is doing well. His swelling and leg lesions have improved. No fevers or acute concerns. Uses [facility] for CPAP suppplies. Denies CP/palp/SOB/HA/congestion/fevers/GI or GU c/o. Taking meds as prescribed  Past Medical History  Diagnosis Date  . Valvular heart disease   . Aortic stenosis   . Osteoarthritis   . Anemia   . OSA (obstructive sleep apnea)   . Bursitis   . Gout     takes Allopurinol daily  . Arthritis   . Hyperlipidemia     takes Atorvastatin daily  . DDD (degenerative disc disease)   . Peroneal palsy     significant right foot drop  . History of shingles   . Constipation     takes Miralax daily as well as Senokot daily  . Peripheral edema     takes Furosemide daily  . Hypertension     takes Lisinopril daily  . Complication of anesthesia     Halucinations  . History of blood clots 1962    knee  . Pneumonia 25+yrs ago    hx of  . Peripheral neuropathy (HCC)     takes Gabapentin daily  . History of blood transfusion     no abnormal reaction noted  . Urinary urgency   . Urinary frequency   . Cataract     left immature  . Aortic stenosis, severe     S/p Edwards Sapien 3 Transcatheter Heart Valve (size 26 mm, model # [id number], serial # [id number])  . Cellulitis 10/12/2015    Past Surgical History  Procedure Laterality Date  . Total knee arthroplasty      right  . Spine surgery    . Laminotomy  1193    c6-t2  . Hernia repair      umbilical hernia  . Knee surgery Right     multiple knee surgeries due to complication of R TKA  . Colonoscopy N/A 02/07/2013    Procedure: COLONOSCOPY;  Surgeon: Juanita Craver, MD;  Location: [facility] ENDOSCOPY;  Service: Endoscopy;   Laterality: N/A;  . Esophagogastroduodenoscopy N/A 02/07/2013    Procedure: ESOPHAGOGASTRODUODENOSCOPY (EGD);  Surgeon: Juanita Craver, MD;  Location: [facility] ENDOSCOPY;  Service: Endoscopy;  Laterality: N/A;  . Colonoscopy N/A 02/09/2013    Procedure: COLONOSCOPY;  Surgeon: Beryle Beams, MD;  Location: [place];  Service: Endoscopy;  Laterality: N/A;  . Givens capsule study N/A 02/09/2013    Procedure: GIVENS CAPSULE STUDY;  Surgeon: Beryle Beams, MD;  Location: [place];  Service: Endoscopy;  Laterality: N/A;  . Tee without cardioversion N/A 03/07/2015    Procedure: TRANSESOPHAGEAL ECHOCARDIOGRAM (TEE);  Surgeon: Josue Hector, MD;  Location: [facility] ENDOSCOPY;  Service: Cardiovascular;  Laterality: N/A;  ANES TO BRING PROPOFOL PER DOCTOR  . Cardiac catheterization N/A 05/02/2015    Procedure: Right/Left Heart Cath and Coronary Angiography;  Surgeon: Burnell Blanks, MD;  Location: [place] CV LAB;  Service: Cardiovascular;  Laterality: N/A;  . Transcatheter aortic valve replacement, transfemoral Left 06/10/2015    Procedure: TRANSCATHETER AORTIC VALVE REPLACEMENT, TRANSFEMORAL;  Surgeon: Burnell Blanks, MD;  Location: [place];  Service: Open Heart Surgery;  Laterality: Left;  . Tee without cardioversion N/A 06/10/2015    Procedure: TRANSESOPHAGEAL ECHOCARDIOGRAM (TEE);  Surgeon: Burnell Blanks, MD;  Location: [place];  Service: Open Heart Surgery;  Laterality: N/A;    Family History  Problem Relation Age of Onset  . Arthritis Sister     "crippling"  . Arthritis Sister     Social History   Social History  . Marital Status: Married    Spouse Name: N/A  . Number of Children: N/A  . Years of Education: N/A   Occupational History  . retired Administrator    Social History Main Topics  . Smoking status: Former Smoker -- 30 years    Types: Pipe    Quit date: 09/20/2006  . Smokeless tobacco: Never Used  . Alcohol Use: No  . Drug Use: No  . Sexual Activity: No      Comment: lives with wife, retired, no dietary restrictions   Other Topics Concern  . Not on file   Social History Narrative    Outpatient Prescriptions Prior to Visit  Medication Sig Dispense Refill  . acetaminophen (TYLENOL) 500 MG tablet Take 500 mg by mouth 2 (two) times daily.     [name] aspirin 81 MG tablet Take 1 tablet (81 mg total) by mouth daily. 30 tablet 11  . atorvastatin (LIPITOR) 40 MG tablet Take 40 mg by mouth daily.    . cholecalciferol (VITAMIN D) 1000 UNITS tablet Take 2,000 Units by mouth daily.     . clopidogrel (PLAVIX) 75 MG tablet Take 1 tablet (75 mg total) by mouth daily with breakfast. 30 tablet 11  . Coenzyme Q10 (COQ10) 200 MG CAPS Take 200 mg by mouth daily.    . furosemide (LASIX) 20 MG tablet Take 1 tablet (20 mg total) by mouth 2 (two) times daily. 180 tablet 3  . gabapentin (NEURONTIN) 300 MG capsule Take 1 capsule (300 mg total) by mouth 2 (two) times daily. 180 capsule 1  . hydrocortisone (ANUSOL-HC) 25 MG suppository Place 1 suppository (25 mg total) rectally 2 (two) times daily. 12 suppository 0  . lisinopril (PRINIVIL,ZESTRIL) 10 MG tablet Take 10 mg by mouth daily.    [name] loratadine (CLARITIN) 10 MG tablet Take 1 tablet (10 mg total) by mouth daily. 30 tablet 0  . Menthol-Methyl Salicylate (MUSCLE RUB) 10-15 % CREA Apply 1 application topically as directed.     . Multiple Vitamin (MULTIVITAMIN WITH MINERALS) TABS Take 1 tablet by mouth daily.    . polyethylene glycol (MIRALAX / GLYCOLAX) packet Take 17 g by mouth daily.    . sennosides-docusate sodium (SENOKOT-S) 8.6-50 MG tablet Take 1 tablet by mouth 2 (two) times daily.    . traMADol (ULTRAM) 50 MG tablet TAKE 1 TABLET BY MOUTH EVERY 6 HOURS AS NEEDED 90 tablet 0  . allopurinol (ZYLOPRIM) 100 MG tablet Take 100 mg by mouth daily.    [name] allopurinol (ZYLOPRIM) 100 MG tablet TAKE 1 TABLET BY MOUTH TWICE A DAY 60 tablet 2  . cephALEXin (KEFLEX) 500 MG capsule Take 1 capsule (500 mg total) by mouth 4 (four)  times daily. 40 capsule 0  . fluconazole (DIFLUCAN) 150 MG tablet Take 1 tablet (150 mg total) by mouth once a week. 3 tablet 0   No facility-administered medications prior to visit.    Allergies  Allergen Reactions  . Coumadin [Warfarin Sodium] Other (See Comments)    States he can't be on this-bleeds out    Review of Systems  Constitutional: Positive for malaise/fatigue. Negative for fever.  HENT: Negative for congestion.   Eyes: Negative for discharge.  Respiratory: Positive for shortness of breath.   Cardiovascular: Positive for leg swelling. Negative for chest pain and palpitations.  Gastrointestinal: Negative for nausea and abdominal pain.  Genitourinary: Negative for dysuria.  Musculoskeletal: Negative for falls.  Skin: Negative for rash.  Neurological: Negative for loss of consciousness and headaches.  Endo/Heme/Allergies: Negative for environmental allergies.  Psychiatric/Behavioral: Negative for depression. The patient is not nervous/anxious.        Objective:    Physical Exam  Constitutional: He is oriented to person, place, and time. He appears well-developed and well-nourished. No distress.  HENT:  Head: Normocephalic and atraumatic.  Nose: Nose normal.  Eyes: Right eye exhibits no discharge. Left eye exhibits no discharge.  Neck: Normal range of motion. Neck supple.  Cardiovascular: Normal rate and regular rhythm.   No murmur heard. Pulmonary/Chest: Effort normal and breath sounds normal.  Abdominal: Soft. Bowel sounds are normal. There is no tenderness.  Musculoskeletal: He exhibits no edema.  Neurological: He is alert and oriented to person, place, and time.  Skin: Skin is warm and dry.  Psychiatric: He has a normal mood and affect.  Nursing note and vitals reviewed.   BP 112/80 mmHg  Pulse 74  Temp(Src) 98.3 F (36.8 C) (Oral)  Ht 6\' 1"  (1.854 m)  Wt 375 lb (170.099 kg)  BMI 49.49 kg/m2  SpO2 96% Wt Readings from Last 3 Encounters:  11/07/15  375 lb (170.099 kg)  10/03/15 375 lb (170.099 kg)  10/02/15 375 lb (170.099 kg)     Lab Results  Component Value Date   WBC 7.8 11/07/2015   HGB 12.0* 11/07/2015   HCT 37.0* 11/07/2015   PLT 144.0* 11/07/2015   GLUCOSE 95 11/07/2015   CHOL 137 11/07/2015   TRIG 146.0 11/07/2015   HDL 29.50* 11/07/2015   LDLCALC 78 11/07/2015   ALT 22 11/07/2015   AST 25 11/07/2015   NA 136 11/07/2015   K 5.1 11/07/2015   CL 101 11/07/2015   CREATININE 1.06 11/07/2015   BUN 27* 11/07/2015   CO2 29 11/07/2015   TSH 1.31 11/07/2015   INR 1.32 06/10/2015   HGBA1C 5.7* 06/06/2015    Lab Results  Component Value Date   TSH 1.31 11/07/2015   Lab Results  Component Value Date   WBC 7.8 11/07/2015   HGB 12.0* 11/07/2015   HCT 37.0* 11/07/2015   MCV 91.7 11/07/2015   PLT 144.0* 11/07/2015   Lab Results  Component Value Date   NA 136 11/07/2015   K 5.1 11/07/2015   CO2 29 11/07/2015   GLUCOSE 95 11/07/2015   BUN 27* 11/07/2015   CREATININE 1.06 11/07/2015   BILITOT 0.4 11/07/2015   ALKPHOS 73 11/07/2015   AST 25 11/07/2015   ALT 22 11/07/2015   PROT 6.7 11/07/2015   ALBUMIN 3.9 11/07/2015   CALCIUM 9.4 11/07/2015   ANIONGAP 3* 06/13/2015   GFR 72.73 11/07/2015   Lab Results  Component Value Date   CHOL 137 11/07/2015   Lab Results  Component Value Date   HDL 29.50* 11/07/2015   Lab Results  Component Value Date   LDLCALC 78 11/07/2015   Lab Results  Component Value Date   TRIG 146.0 11/07/2015   Lab Results  Component Value Date   CHOLHDL 5 11/07/2015   Lab Results  Component Value Date   HGBA1C 5.7* 06/06/2015  Assessment & Plan:   Problem List Items Addressed This Visit    Anemia    Increase leafy greens, consider increased lean red meat and using cast iron cookware. Continue to monitor, report any concerns      Chronic venous insufficiency    Stay well hydrated, monitor      Relevant Orders   Uric acid (Completed)   Comprehensive  metabolic panel (Completed)   TSH (Completed)   Lipid panel (Completed)   CBC (Completed)   HYPERCHOLESTEROLEMIA    Tolerating statin, encouraged heart healthy diet, avoid trans fats, minimize simple carbs and saturated fats. Increase exercise as tolerated      Relevant Orders   Uric acid (Completed)   Comprehensive metabolic panel (Completed)   TSH (Completed)   Lipid panel (Completed)   CBC (Completed)   OBESITY, MORBID - Primary    Encouraged DASH diet, decrease po intake and increase exercise as tolerated. Needs 7-8 hours of sleep nightly. Avoid trans fats, eat small, frequent meals every 4-5 hours with lean proteins, complex carbs and healthy fats. Minimize simple carbs      Relevant Orders   Uric acid (Completed)   Comprehensive metabolic panel (Completed)   TSH (Completed)   Lipid panel (Completed)   CBC (Completed)   Obstructive sleep apnea    Using CPAP nightly      Severe aortic valve stenosis    Largely wheelchair bound but is stable       Other Visit Diagnoses    Need for 23-polyvalent pneumococcal polysaccharide vaccine        Relevant Orders    Pneumococcal polysaccharide vaccine 23-valent greater than or equal to 2yo subcutaneous/IM (Completed)       I have discontinued Mr. Gahan's fluconazole, allopurinol, cephALEXin, and allopurinol. I am also having him start on allopurinol. Additionally, I am having him maintain his cholecalciferol, sennosides-docusate sodium, multivitamin with minerals, loratadine, acetaminophen, CoQ10, polyethylene glycol, MUSCLE RUB, clopidogrel, aspirin, gabapentin, hydrocortisone, atorvastatin, lisinopril, furosemide, and traMADol.  Meds ordered this encounter  Medications  . allopurinol (ZYLOPRIM) 300 MG tablet    Sig: Take 1 tablet (300 mg total) by mouth daily.    Dispense:  30 tablet    Refill:  6     Penni Homans, MD

## 2015-11-16 NOTE — Assessment & Plan Note (Signed)
Largely wheelchair bound but is stable

## 2015-12-22 ENCOUNTER — Telehealth: Payer: Self-pay | Admitting: Family Medicine

## 2015-12-22 NOTE — Telephone Encounter (Signed)
So I am not opposed to writing an order for CPAP but since I did not initiate it I do not have a sleep study to refer back to. When was his last study? Check with [facility], maybe they have a copy and have a report on his use, usually they have a report on the compliance and hours of use.

## 2015-12-22 NOTE — Telephone Encounter (Signed)
Caller name: Rafeal Sardinas Relationship to patient: wife Can be reached: [phone] Pharmacy:  Reason for call: Pt cpap machine was not working so he has a Materials engineer from [facility]. They have informed pt that his unit can not be repaired and they need an RX to replace cpap machine from doctor who pt has visited within the past 6 months. [facility] ph# [phone], fax# [phone]

## 2015-12-22 NOTE — Telephone Encounter (Signed)
Last OV 11/17/2015

## 2015-12-31 ENCOUNTER — Telehealth: Payer: Self-pay | Admitting: Internal Medicine

## 2015-12-31 NOTE — Telephone Encounter (Signed)
Spoke with pt. He is needing a new CPAP machine. Advised him that since we have not seen him since 2015 his insurance will not cover the machine. He has been scheduled to see CY on 01/05/16 at 2:15pm. Advised him that we would place the order for the new machine once he comes in. He agreed and verbalized understanding. Nothing further was needed at this time.

## 2016-01-05 ENCOUNTER — Ambulatory Visit (INDEPENDENT_AMBULATORY_CARE_PROVIDER_SITE_OTHER): Payer: PPO | Admitting: Internal Medicine

## 2016-01-05 ENCOUNTER — Encounter: Payer: Self-pay | Admitting: Internal Medicine

## 2016-01-05 ENCOUNTER — Other Ambulatory Visit: Payer: Self-pay | Admitting: Family Medicine

## 2016-01-05 VITALS — BP 128/68 | HR 91 | Ht 73.0 in | Wt 380.0 lb

## 2016-01-05 DIAGNOSIS — G4733 Obstructive sleep apnea (adult) (pediatric): Secondary | ICD-10-CM

## 2016-01-05 DIAGNOSIS — I359 Nonrheumatic aortic valve disorder, unspecified: Secondary | ICD-10-CM | POA: Diagnosis not present

## 2016-01-05 NOTE — Telephone Encounter (Signed)
Last refill 10/08/2015 and last office visit 11/07/2015

## 2016-01-05 NOTE — Progress Notes (Signed)
08/08/11- 70 yoM former smoker followed for OSA complicated by obesity, aortic stenosis, osteoarthritis LOV- 03/17/2010   wife here He has had flu vaccine At last visit CPAP 15 was too low and wife says he snored through it. We moved him back up to 18 CWP. He is comfortable with this and describes good compliance and control. He says because of his size, he never had heart valve replacement.  09/18/12 62 yoM former smoker followed for OSA complicated by obesity, aortic stenosis, osteoarthritis FOLLOWS [id number] wears CPAP/18/[facility] every night for about 8 hours; pressure working well for patient. He says he likes CPAP and has no issues with it. He remains comfortable with pressure. He has had some problems with the DME company getting timely supplies, but he will discuss this with [facility].  09/17/14- 57 yoM former smoker followed for OSA complicated by obesity, aortic stenosis, osteoarthritis FOLLOWS FOR: pt wears cpap 18/ [facility] about 8 hours nightly, denies any pressure/supply problems.  Wife here She says he snores through his mask. Machine is getting old. If anything, he has gained more weight.  01/05/2016-75 year old male former smoker followed for OSA complicated by morbid obesity, aortic stenosis/ AVR, osteoarthritis CPAP 18/[facility] Follows for: OSA. Pt states that his CPAP machine is broken and he has a temporary machine from [facility]. Pt did bring the SD card from the current machine but there is no data available. Pt states that he does wear CPAP nightly for about 8 hours nightly. Pt does feel the machine works well and he does feel rested.  CXR 06/11/2015-IMPRESSION: 1. Right IJ sheath and Swan-Ganz catheter stable position. 2. Prior aortic valve replacement. Stable cardiomegaly. 3. Low lung volumes with bibasilar atelectasis.  ROS- see HPI Constitutional:   No-   weight loss, night sweats, fevers, chills, fatigue, lassitude. HEENT:   No-  headaches, difficulty swallowing,  tooth/dental problems, sore throat,       No-  sneezing, itching, ear ache, nasal congestion, post nasal drip,  CV:  No-   chest pain, orthopnea, PND, swelling in lower extremities, anasarca,  dizziness, palpitations Resp: +shortness of breath with exertion or at rest.              No-   productive cough,  No non-productive cough,  No- coughing up of blood.              No-   change in color of mucus.  No- wheezing.   Skin: No-   rash or lesions. GI:  No-   heartburn, indigestion, abdominal pain, nausea, vomiting, GU:  MS:  No-   joint pain or swelling.   Neuro-     nothing unusual Psych:  No- change in mood or affect. No depression or anxiety.  No memory loss.  OBJ General- Alert, Oriented, Affect-appropriate, Distress- none acute. +Morbidly obese, +power  wheelchair  Skin- rash-none, lesions- none, excoriation- none Lymphadenopathy- none Head- atraumatic            Eyes- Gross vision intact, PERRLA, conjunctivae clear secretions            Ears- Hearing, canals-normal            Nose- Clear, no-Septal dev, mucus, polyps, erosion, perforation             Throat- Mallampati III-IV , mucosa clear , drainage- none, tonsils- atrophic Neck- flexible , trachea midline, no stridor , thyroid nl, carotid no bruit Chest - symmetrical excursion , unlabored  Heart/CV- RRR , murmur-none, no gallop,  no rub, nl s1 s2                           - JVD- none , edema- none, stasis changes- none, varices- none           Lung- clear to P&A, wheeze- none, cough- none , dullness-none, rub- none           Chest wall-  Abd-  Br/ Gen/ Rectal- Not done, not indicated Extrem- cyanosis- none, clubbing, none, atrophy- none, strength- nl Neuro- grossly intact to observation

## 2016-01-05 NOTE — Patient Instructions (Signed)
Order- DME  Replacement for old, worn-out CPAP machine, 20 cwp, mask of choice, humidifier, supplies, AirView                      Dx OSA  Please call as needed

## 2016-01-05 NOTE — Telephone Encounter (Signed)
I found this note in his chart from the pulmonary office .    Expand All Collapse All   Spoke with pt. He is needing a new CPAP machine. Advised him that since we have not seen him since 2015 his insurance will not cover the machine. He has been scheduled to see CY on 01/05/16 at 2:15pm. Advised him that we would place the order for the new machine once he comes in. He agreed and verbalized understanding. Nothing further was needed at this time.

## 2016-01-06 NOTE — Assessment & Plan Note (Signed)
He has not made the lifestyle changes necessary to bring his weight down significantly.

## 2016-01-06 NOTE — Assessment & Plan Note (Signed)
He is very compliant with his loaner machine but needs replacement for his own old worn out machine. Pressure seems comfortable and he describes good control of snoring and sleepiness.

## 2016-01-06 NOTE — Assessment & Plan Note (Signed)
No longer hear murmur after TAVR

## 2016-01-06 NOTE — Telephone Encounter (Signed)
[facility] 

## 2016-01-06 NOTE — Telephone Encounter (Signed)
Faxed hardcopy to [facility]

## 2016-01-21 DIAGNOSIS — G4733 Obstructive sleep apnea (adult) (pediatric): Secondary | ICD-10-CM | POA: Diagnosis not present

## 2016-02-03 ENCOUNTER — Ambulatory Visit (INDEPENDENT_AMBULATORY_CARE_PROVIDER_SITE_OTHER): Payer: PPO | Admitting: Family Medicine

## 2016-02-03 ENCOUNTER — Encounter: Payer: Self-pay | Admitting: Family Medicine

## 2016-02-03 VITALS — BP 113/40 | HR 63 | Temp 98.8°F | Ht 72.0 in | Wt 380.0 lb

## 2016-02-03 DIAGNOSIS — D696 Thrombocytopenia, unspecified: Secondary | ICD-10-CM

## 2016-02-03 DIAGNOSIS — G4733 Obstructive sleep apnea (adult) (pediatric): Secondary | ICD-10-CM

## 2016-02-03 DIAGNOSIS — M109 Gout, unspecified: Secondary | ICD-10-CM

## 2016-02-03 DIAGNOSIS — D649 Anemia, unspecified: Secondary | ICD-10-CM | POA: Diagnosis not present

## 2016-02-03 DIAGNOSIS — I350 Nonrheumatic aortic (valve) stenosis: Secondary | ICD-10-CM

## 2016-02-03 DIAGNOSIS — Z0000 Encounter for general adult medical examination without abnormal findings: Secondary | ICD-10-CM | POA: Diagnosis not present

## 2016-02-03 DIAGNOSIS — E7800 Pure hypercholesterolemia, unspecified: Secondary | ICD-10-CM

## 2016-02-03 LAB — COMPREHENSIVE METABOLIC PANEL
ALT: 20 U/L (ref 0–53)
AST: 22 U/L (ref 0–37)
Albumin: 3.8 g/dL (ref 3.5–5.2)
Alkaline Phosphatase: 63 U/L (ref 39–117)
BUN: 41 mg/dL — ABNORMAL HIGH (ref 6–23)
CO2: 31 mEq/L (ref 19–32)
Calcium: 9.4 mg/dL (ref 8.4–10.5)
Chloride: 103 mEq/L (ref 96–112)
Creatinine, Ser: 1.24 mg/dL (ref 0.40–1.50)
GFR: 60.65 mL/min (ref >60.00)
Glucose, Bld: 93 mg/dL (ref 70–99)
Potassium: 5.4 mEq/L — ABNORMAL HIGH (ref 3.5–5.1)
Sodium: 137 mEq/L (ref 135–145)
Total Bilirubin: 0.4 mg/dL (ref 0.2–1.2)
Total Protein: 6.7 g/dL (ref 6.0–8.3)

## 2016-02-03 LAB — CBC
HCT: 35.1 % — ABNORMAL LOW (ref 39.0–52.0)
Hemoglobin: 11.5 g/dL — ABNORMAL LOW (ref 13.0–17.0)
MCHC: 32.8 g/dL (ref 30.0–36.0)
MCV: 92.1 fl (ref 78.0–100.0)
Platelets: 161 10*3/uL (ref 150.0–400.0)
RBC: 3.82 Mil/uL — ABNORMAL LOW (ref 4.22–5.81)
RDW: 17 % — ABNORMAL HIGH (ref 11.5–15.5)
WBC: 7.7 10*3/uL (ref 4.0–10.5)

## 2016-02-03 LAB — URIC ACID: Uric Acid, Serum: 7.4 mg/dL (ref 4.0–7.8)

## 2016-02-03 NOTE — Assessment & Plan Note (Signed)
Check uric acid level no recent flare

## 2016-02-03 NOTE — Progress Notes (Signed)
Pre visit review using our clinic review tool, if applicable. No additional management support is needed unless otherwise documented below in the visit note. 

## 2016-02-03 NOTE — Assessment & Plan Note (Signed)
Follows with cardiology. Is wheelchair bound at this time.

## 2016-02-03 NOTE — Assessment & Plan Note (Signed)
Mild, asymptomatic, with leukopenia. 

## 2016-02-03 NOTE — Patient Instructions (Signed)

## 2016-02-03 NOTE — Assessment & Plan Note (Signed)
New CPAP x 1 month, he is using regularly but he notes he feels more tired since switching machines. Is following with [facility], Dr Annamaria Boots.

## 2016-02-03 NOTE — Assessment & Plan Note (Signed)
Tolerating statin, encouraged heart healthy diet, avoid trans fats, minimize simple carbs and saturated fats. Increase exercise as tolerated 

## 2016-02-03 NOTE — Progress Notes (Signed)
Subjective:    Patient ID: [name], male    DOB: 08-Oct-1940, 75 y.o.   MRN: [id number]  Chief Complaint  Patient presents with  . Follow-up    HPI Patient is in today for follow up.  Patient reports he is doing well just got a new CPAP machine, doesn't like machine too much feels like he has been more tired is still adjusting to getting use to it. Denies CP/palp/HA/congestion/fevers/GI or GU c/o. Taking meds as prescribed. No recent gout flares or hospitalizations.   Past Medical History  Diagnosis Date  . Valvular heart disease   . Aortic stenosis   . Osteoarthritis   . Anemia   . OSA (obstructive sleep apnea)   . Bursitis   . Gout     takes Allopurinol daily  . Arthritis   . Hyperlipidemia     takes Atorvastatin daily  . DDD (degenerative disc disease)   . Peroneal palsy     significant right foot drop  . History of shingles   . Constipation     takes Miralax daily as well as Senokot daily  . Peripheral edema     takes Furosemide daily  . Hypertension     takes Lisinopril daily  . Complication of anesthesia     Halucinations  . History of blood clots 1962    knee  . Pneumonia 25+yrs ago    hx of  . Peripheral neuropathy (HCC)     takes Gabapentin daily  . History of blood transfusion     no abnormal reaction noted  . Urinary urgency   . Urinary frequency   . Cataract     left immature  . Aortic stenosis, severe     S/p Edwards Sapien 3 Transcatheter Heart Valve (size 26 mm, model # [id number], serial # [id number])  . Cellulitis 10/12/2015    Past Surgical History  Procedure Laterality Date  . Total knee arthroplasty      right  . Spine surgery    . Laminotomy  1193    c6-t2  . Hernia repair      umbilical hernia  . Knee surgery Right     multiple knee surgeries due to complication of R TKA  . Colonoscopy N/A 02/07/2013    Procedure: COLONOSCOPY;  Surgeon: Juanita Craver, MD;  Location: [facility] ENDOSCOPY;  Service: Endoscopy;  Laterality: N/A;  .  Esophagogastroduodenoscopy N/A 02/07/2013    Procedure: ESOPHAGOGASTRODUODENOSCOPY (EGD);  Surgeon: Juanita Craver, MD;  Location: [facility] ENDOSCOPY;  Service: Endoscopy;  Laterality: N/A;  . Colonoscopy N/A 02/09/2013    Procedure: COLONOSCOPY;  Surgeon: Beryle Beams, MD;  Location: [place];  Service: Endoscopy;  Laterality: N/A;  . Givens capsule study N/A 02/09/2013    Procedure: GIVENS CAPSULE STUDY;  Surgeon: Beryle Beams, MD;  Location: [place];  Service: Endoscopy;  Laterality: N/A;  . Tee without cardioversion N/A 03/07/2015    Procedure: TRANSESOPHAGEAL ECHOCARDIOGRAM (TEE);  Surgeon: Josue Hector, MD;  Location: [facility] ENDOSCOPY;  Service: Cardiovascular;  Laterality: N/A;  ANES TO BRING PROPOFOL PER DOCTOR  . Cardiac catheterization N/A 05/02/2015    Procedure: Right/Left Heart Cath and Coronary Angiography;  Surgeon: Burnell Blanks, MD;  Location: [place] CV LAB;  Service: Cardiovascular;  Laterality: N/A;  . Transcatheter aortic valve replacement, transfemoral Left 06/10/2015    Procedure: TRANSCATHETER AORTIC VALVE REPLACEMENT, TRANSFEMORAL;  Surgeon: Burnell Blanks, MD;  Location: [place];  Service: Open Heart Surgery;  Laterality: Left;  .  Tee without cardioversion N/A 06/10/2015    Procedure: TRANSESOPHAGEAL ECHOCARDIOGRAM (TEE);  Surgeon: Burnell Blanks, MD;  Location: [place];  Service: Open Heart Surgery;  Laterality: N/A;    Family History  Problem Relation Age of Onset  . Arthritis Sister     "crippling"  . Arthritis Sister     Social History   Social History  . Marital Status: Married    Spouse Name: N/A  . Number of Children: N/A  . Years of Education: N/A   Occupational History  . retired Administrator    Social History Main Topics  . Smoking status: Former Smoker -- 30 years    Types: Pipe    Quit date: 09/20/2006  . Smokeless tobacco: Never Used  . Alcohol Use: No  . Drug Use: No  . Sexual Activity: No     Comment: lives with  wife, retired, no dietary restrictions   Other Topics Concern  . Not on file   Social History Narrative    Outpatient Prescriptions Prior to Visit  Medication Sig Dispense Refill  . acetaminophen (TYLENOL) 500 MG tablet Take 500 mg by mouth 2 (two) times daily.     [name] allopurinol (ZYLOPRIM) 300 MG tablet Take 1 tablet (300 mg total) by mouth daily. 30 tablet 6  . aspirin 81 MG tablet Take 1 tablet (81 mg total) by mouth daily. 30 tablet 11  . atorvastatin (LIPITOR) 40 MG tablet Take 40 mg by mouth daily.    . cholecalciferol (VITAMIN D) 1000 UNITS tablet Take 2,000 Units by mouth daily.     . clopidogrel (PLAVIX) 75 MG tablet Take 1 tablet (75 mg total) by mouth daily with breakfast. 30 tablet 11  . Coenzyme Q10 (COQ10) 200 MG CAPS Take 200 mg by mouth daily.    . furosemide (LASIX) 20 MG tablet Take 1 tablet (20 mg total) by mouth 2 (two) times daily. 180 tablet 3  . gabapentin (NEURONTIN) 300 MG capsule Take 1 capsule (300 mg total) by mouth 2 (two) times daily. 180 capsule 1  . hydrocortisone (ANUSOL-HC) 25 MG suppository Place 1 suppository (25 mg total) rectally 2 (two) times daily. 12 suppository 0  . lisinopril (PRINIVIL,ZESTRIL) 10 MG tablet Take 10 mg by mouth daily.    [name] loratadine (CLARITIN) 10 MG tablet Take 1 tablet (10 mg total) by mouth daily. 30 tablet 0  . Menthol-Methyl Salicylate (MUSCLE RUB) 10-15 % CREA Apply 1 application topically as directed.     . Multiple Vitamin (MULTIVITAMIN WITH MINERALS) TABS Take 1 tablet by mouth daily.    . polyethylene glycol (MIRALAX / GLYCOLAX) packet Take 17 g by mouth daily.    . sennosides-docusate sodium (SENOKOT-S) 8.6-50 MG tablet Take 1 tablet by mouth 2 (two) times daily.    . traMADol (ULTRAM) 50 MG tablet TAKE 1 TABLET BY MOUTH EVERY 6 HOURS AS NEEDED 90 tablet 0   No facility-administered medications prior to visit.    Allergies  Allergen Reactions  . Coumadin [Warfarin Sodium] Other (See Comments)    States he can't be  on this-bleeds out    Review of Systems  Constitutional: Positive for malaise/fatigue. Negative for fever.  HENT: Negative for congestion.   Eyes: Negative for blurred vision.  Respiratory: Positive for cough and shortness of breath.   Cardiovascular: Negative for chest pain, palpitations and leg swelling.  Gastrointestinal: Negative for nausea, abdominal pain and blood in stool.  Genitourinary: Negative for dysuria and frequency.  Musculoskeletal: Negative for  falls.  Skin: Negative for rash.  Neurological: Negative for dizziness, loss of consciousness and headaches.  Endo/Heme/Allergies: Negative for environmental allergies.  Psychiatric/Behavioral: Negative for depression. The patient is not nervous/anxious.        Objective:    Physical Exam  Constitutional: He is oriented to person, place, and time. He appears well-developed and well-nourished. No distress.  HENT:  Head: Normocephalic and atraumatic.  Eyes: Conjunctivae are normal.  Neck: Neck supple. No thyromegaly present.  Cardiovascular: Normal rate and regular rhythm.  Exam reveals no gallop.   Murmur heard. Pulmonary/Chest: Effort normal and breath sounds normal. No respiratory distress. He has no wheezes.  Abdominal: Soft. Bowel sounds are normal. He exhibits no mass. There is no tenderness.  Musculoskeletal: He exhibits edema.  Chronic venous stasis changes b/l  Lymphadenopathy:    He has no cervical adenopathy.  Neurological: He is alert and oriented to person, place, and time.  Skin: Skin is warm and dry.  Psychiatric: He has a normal mood and affect. His behavior is normal.    BP 113/40 mmHg  Pulse 63  Temp(Src) 98.8 F (37.1 C) (Oral)  Ht 6' (1.829 m)  Wt 380 lb (172.367 kg)  BMI 51.53 kg/m2  SpO2 98% Wt Readings from Last 3 Encounters:  02/03/16 380 lb (172.367 kg)  01/05/16 380 lb (172.367 kg)  11/07/15 375 lb (170.099 kg)     Lab Results  Component Value Date   WBC 7.8 11/07/2015   HGB  12.0* 11/07/2015   HCT 37.0* 11/07/2015   PLT 144.0* 11/07/2015   GLUCOSE 95 11/07/2015   CHOL 137 11/07/2015   TRIG 146.0 11/07/2015   HDL 29.50* 11/07/2015   LDLCALC 78 11/07/2015   ALT 22 11/07/2015   AST 25 11/07/2015   NA 136 11/07/2015   K 5.1 11/07/2015   CL 101 11/07/2015   CREATININE 1.06 11/07/2015   BUN 27* 11/07/2015   CO2 29 11/07/2015   TSH 1.31 11/07/2015   INR 1.32 06/10/2015   HGBA1C 5.7* 06/06/2015    Lab Results  Component Value Date   TSH 1.31 11/07/2015   Lab Results  Component Value Date   WBC 7.8 11/07/2015   HGB 12.0* 11/07/2015   HCT 37.0* 11/07/2015   MCV 91.7 11/07/2015   PLT 144.0* 11/07/2015   Lab Results  Component Value Date   NA 136 11/07/2015   K 5.1 11/07/2015   CO2 29 11/07/2015   GLUCOSE 95 11/07/2015   BUN 27* 11/07/2015   CREATININE 1.06 11/07/2015   BILITOT 0.4 11/07/2015   ALKPHOS 73 11/07/2015   AST 25 11/07/2015   ALT 22 11/07/2015   PROT 6.7 11/07/2015   ALBUMIN 3.9 11/07/2015   CALCIUM 9.4 11/07/2015   ANIONGAP 3* 06/13/2015   GFR 72.73 11/07/2015   Lab Results  Component Value Date   CHOL 137 11/07/2015   Lab Results  Component Value Date   HDL 29.50* 11/07/2015   Lab Results  Component Value Date   LDLCALC 78 11/07/2015   Lab Results  Component Value Date   TRIG 146.0 11/07/2015   Lab Results  Component Value Date   CHOLHDL 5 11/07/2015   Lab Results  Component Value Date   HGBA1C 5.7* 06/06/2015       Assessment & Plan:   Problem List Items Addressed This Visit    Thrombocytopenia, unspecified (HCC)    Mild, asymptomatic      Relevant Orders   CBC   Comprehensive metabolic panel  Uric acid   Severe aortic valve stenosis    Follows with cardiology. Is wheelchair bound at this time.      Preventative health care   Relevant Orders   CBC   Comprehensive metabolic panel   Uric acid   Obstructive sleep apnea    New CPAP x 1 month, he is using regularly but he notes he feels more  tired since switching machines. Is following with [facility], Dr Annamaria Boots.       HYPERCHOLESTEROLEMIA    Tolerating statin, encouraged heart healthy diet, avoid trans fats, minimize simple carbs and saturated fats. Increase exercise as tolerated      Gout - Primary    Check uric acid level no recent flare      Relevant Orders   CBC   Comprehensive metabolic panel   Uric acid   Anemia    Increase leafy greens, consider increased lean red meat and using cast iron cookware. Continue to monitor, report any concerns, mild and asymptomatic      Relevant Orders   CBC   Comprehensive metabolic panel   Uric acid      I am having Mr. Raether maintain his cholecalciferol, sennosides-docusate sodium, multivitamin with minerals, loratadine, acetaminophen, CoQ10, polyethylene glycol, MUSCLE RUB, clopidogrel, aspirin, gabapentin, hydrocortisone, atorvastatin, lisinopril, furosemide, allopurinol, and traMADol.  No orders of the defined types were placed in this encounter.     Penni Homans, MD

## 2016-02-03 NOTE — Assessment & Plan Note (Signed)
Increase leafy greens, consider increased lean red meat and using cast iron cookware. Continue to monitor, report any concerns, mild and asymptomatic

## 2016-02-21 DIAGNOSIS — G4733 Obstructive sleep apnea (adult) (pediatric): Secondary | ICD-10-CM | POA: Diagnosis not present

## 2016-03-01 ENCOUNTER — Other Ambulatory Visit: Payer: Self-pay | Admitting: Family Medicine

## 2016-03-09 ENCOUNTER — Ambulatory Visit (INDEPENDENT_AMBULATORY_CARE_PROVIDER_SITE_OTHER): Payer: PPO | Admitting: Adult Health

## 2016-03-09 ENCOUNTER — Encounter: Payer: Self-pay | Admitting: Adult Health

## 2016-03-09 VITALS — BP 118/70 | HR 77 | Temp 98.0°F | Ht 74.0 in | Wt 380.0 lb

## 2016-03-09 DIAGNOSIS — G4733 Obstructive sleep apnea (adult) (pediatric): Secondary | ICD-10-CM

## 2016-03-09 NOTE — Assessment & Plan Note (Signed)
Patient with very severe sleep apnea, not well controlled on nocturnal C Pap Patient has excellent compliance with good usage. He is on 20 cm of H2O CPAP setting  Will need to set up for attended CPAP titratioin study   Plan  We are setting you up for a CPAP titration study .  Add chin strap to CPAP headgear.  Follow up Dr. Annamaria Boots  In 2-3 months and As needed

## 2016-03-09 NOTE — Addendum Note (Signed)
Addended by: Osa Craver on: 03/09/2016 10:57 AM   Modules accepted: Orders

## 2016-03-09 NOTE — Patient Instructions (Signed)
We are setting you up for a CPAP titration study .  Add chin strap to CPAP headgear.  Follow up Dr. Annamaria Boots  In 2-3 months and As needed

## 2016-03-09 NOTE — Progress Notes (Signed)
Subjective:    Patient ID: [name], male    DOB: [birth year], 75 y.o.   MRN: [id number]  HPI 75 year old male former smoker with severe obstructive sleep apnea, complicated by obesity, aortic stenosis and severe osteoarthritis   03/09/2016 aloe up sleep apnea Patient returns for a two-month follow-up for sleep apnea. Patient was recently started on a new C Pap machine. His old machine had broken. Says he likes his new machine. He feels rested with no significant daytime sleepiness. He does have a nasal mask, which he says he snores.Hollace Kinnier shows excellent compliance with 100% usage. Average usage at 8 hours. He is on a set pressure of 20 cm of H2O. AHI 50.9. Significant leaks. We discussed his download results. And the need to proceed with his C Pap titration study.. Patient denies chest pain, orthopnea, PND, or increased leg swelling.  Past Medical History  Diagnosis Date  . Valvular heart disease   . Aortic stenosis   . Osteoarthritis   . Anemia   . OSA (obstructive sleep apnea)   . Bursitis   . Gout     takes Allopurinol daily  . Arthritis   . Hyperlipidemia     takes Atorvastatin daily  . DDD (degenerative disc disease)   . Peroneal palsy     significant right foot drop  . History of shingles   . Constipation     takes Miralax daily as well as Senokot daily  . Peripheral edema     takes Furosemide daily  . Hypertension     takes Lisinopril daily  . Complication of anesthesia     Halucinations  . History of blood clots 1962    knee  . Pneumonia 25+yrs ago    hx of  . Peripheral neuropathy (HCC)     takes Gabapentin daily  . History of blood transfusion     no abnormal reaction noted  . Urinary urgency   . Urinary frequency   . Cataract     left immature  . Aortic stenosis, severe     S/p Edwards Sapien 3 Transcatheter Heart Valve (size 26 mm, model # [id number], serial # [id number])  . Cellulitis 10/12/2015   Current Outpatient Prescriptions on File  Prior to Visit  Medication Sig Dispense Refill  . acetaminophen (TYLENOL) 500 MG tablet Take 500 mg by mouth 2 (two) times daily.     [name] allopurinol (ZYLOPRIM) 300 MG tablet Take 1 tablet (300 mg total) by mouth daily. 30 tablet 6  . aspirin 81 MG tablet Take 1 tablet (81 mg total) by mouth daily. 30 tablet 11  . atorvastatin (LIPITOR) 40 MG tablet Take 40 mg by mouth daily.    . cholecalciferol (VITAMIN D) 1000 UNITS tablet Take 2,000 Units by mouth daily.     . clopidogrel (PLAVIX) 75 MG tablet Take 1 tablet (75 mg total) by mouth daily with breakfast. 30 tablet 11  . Coenzyme Q10 (COQ10) 200 MG CAPS Take 200 mg by mouth daily.    . furosemide (LASIX) 20 MG tablet Take 1 tablet (20 mg total) by mouth 2 (two) times daily. 180 tablet 3  . gabapentin (NEURONTIN) 300 MG capsule TAKE 1 CAPSULE (300 MG TOTAL) BY MOUTH 2 (TWO) TIMES DAILY. 180 capsule 1  . hydrocortisone (ANUSOL-HC) 25 MG suppository Place 1 suppository (25 mg total) rectally 2 (two) times daily. 12 suppository 0  . lisinopril (PRINIVIL,ZESTRIL) 10 MG tablet Take 10 mg by mouth daily.    [name]  loratadine (CLARITIN) 10 MG tablet Take 1 tablet (10 mg total) by mouth daily. 30 tablet 0  . Menthol-Methyl Salicylate (MUSCLE RUB) 10-15 % CREA Apply 1 application topically as directed.     . Multiple Vitamin (MULTIVITAMIN WITH MINERALS) TABS Take 1 tablet by mouth daily.    . polyethylene glycol (MIRALAX / GLYCOLAX) packet Take 17 g by mouth daily.    . sennosides-docusate sodium (SENOKOT-S) 8.6-50 MG tablet Take 1 tablet by mouth 2 (two) times daily.    . traMADol (ULTRAM) 50 MG tablet TAKE 1 TABLET BY MOUTH EVERY 6 HOURS AS NEEDED 90 tablet 0   No current facility-administered medications on file prior to visit.     Review of Systems Constitutional:   No  weight loss, night sweats,  Fevers, chills, + fatigue, or  lassitude.  HEENT:   No headaches,  Difficulty swallowing,  Tooth/dental problems, or  Sore throat,                No  sneezing, itching, ear ache, nasal congestion, post nasal drip,   CV:  No chest pain,  Orthopnea, PND, swelling in lower extremities, anasarca, dizziness, palpitations, syncope.   GI  No heartburn, indigestion, abdominal pain, nausea, vomiting, diarrhea, change in bowel habits, loss of appetite, bloody stools.   Resp: No shortness of breath with exertion or at rest.  No excess mucus, no productive cough,  No non-productive cough,  No coughing up of blood.  No change in color of mucus.  No wheezing.  No chest wall deformity  Skin: no rash or lesions.  GU: no dysuria, change in color of urine, no urgency or frequency.  No flank pain, no hematuria   MS:  +knee pain   Psych:  No change in mood or affect. No depression or anxiety.  No memory loss.         Objective:   Physical Exam  Filed Vitals:   03/09/16 1009  BP: 118/70  Pulse: 77  Temp: 98 F (36.7 C)  TempSrc: Oral  Height: 6\' 2"  (1.88 m)  Weight: 380 lb (172.367 kg)  SpO2: 95%  Body mass index is 48.77 kg/(m^2).   GEN: A/Ox3; pleasant , NAD, morbidly obese  HEENT:  [place]/AT,  EACs-clear, TMs-wnl, NOSE-clear, THROAT-clear, no lesions, no postnasal drip or exudate noted. Class III MP airway  NECK:  Supple w/ fair ROM; no JVD; normal carotid impulses w/o bruits; no thyromegaly or nodules palpated; no lymphadenopathy.  RESP  Clear  P & A; w/o, wheezes/ rales/ or rhonchi.no accessory muscle use, no dullness to percussion  CARD:  RRR, no m/r/g  , 1+,  peripheral edema, stasis dermatitis changes, pulses intact, no cyanosis or clubbing.  GI:   Soft & nt; nml bowel sounds; no organomegaly or masses detected.  Musco: Warm bil, no deformities or joint swelling noted.   Neuro: alert, no focal deficits noted.    Skin: Warm, no lesions or rashes  Newt Levingston NP-C  [facility]  03/09/2016       Assessment & Plan:

## 2016-03-22 DIAGNOSIS — G4733 Obstructive sleep apnea (adult) (pediatric): Secondary | ICD-10-CM | POA: Diagnosis not present

## 2016-03-29 ENCOUNTER — Other Ambulatory Visit: Payer: Self-pay | Admitting: Family Medicine

## 2016-03-30 NOTE — Telephone Encounter (Signed)
Faxed hardcopy for Tramadol to [facility] in [place]

## 2016-03-31 ENCOUNTER — Encounter: Payer: Self-pay | Admitting: Adult Health

## 2016-04-11 ENCOUNTER — Other Ambulatory Visit: Payer: Self-pay | Admitting: Family Medicine

## 2016-04-14 ENCOUNTER — Ambulatory Visit (HOSPITAL_BASED_OUTPATIENT_CLINIC_OR_DEPARTMENT_OTHER): Payer: PPO | Attending: Adult Health | Admitting: Internal Medicine

## 2016-04-14 VITALS — Ht 74.0 in | Wt 380.0 lb

## 2016-04-14 DIAGNOSIS — G4733 Obstructive sleep apnea (adult) (pediatric): Secondary | ICD-10-CM | POA: Diagnosis not present

## 2016-04-14 DIAGNOSIS — R0683 Snoring: Secondary | ICD-10-CM | POA: Diagnosis not present

## 2016-04-14 DIAGNOSIS — G4730 Sleep apnea, unspecified: Secondary | ICD-10-CM | POA: Diagnosis not present

## 2016-04-14 DIAGNOSIS — Z9989 Dependence on other enabling machines and devices: Secondary | ICD-10-CM

## 2016-04-22 DIAGNOSIS — G4733 Obstructive sleep apnea (adult) (pediatric): Secondary | ICD-10-CM | POA: Diagnosis not present

## 2016-04-22 NOTE — Progress Notes (Signed)
Patient ID: [name], male   DOB: November 27, 1940, 75 y.o.   MRN: [id number]    Chief Complaint  Patient presents with  . Aortic Stenosis    no sx     History of Present Illness: 74 y.o.  male with history of morbid obesity, mild HTN, HLD, gout and severe aortic stenosis who is here today for TAVR  follow up.[name] He underwent TAVR on 06/10/15 with 26 mm Sapien 3 . Left transfemoral approach with cut down by Dr Cyndia Bent He did well following the procedure. There were no complications. He tells me today that he is feeling better. His breathing has improved. He has no pain.He is still limited by chronic joint issues. Started on Keflex and Diflucon by Dr Randel Pigg for LLE cellulitis More energy and less dyspnea since valve done   Primary Care Physician: Penni Homans  January lasix increased to BID for edema New CPAP machine working well and sees Dr Annamaria Boots for OSA  Past Medical History:  Diagnosis Date  . Anemia   . Aortic stenosis   . Aortic stenosis, severe    S/p Edwards Sapien 3 Transcatheter Heart Valve (size 26 mm, model # [id number], serial # [id number])  . Arthritis   . Bursitis   . Cataract    left immature  . Cellulitis 10/12/2015  . Complication of anesthesia    Halucinations  . Constipation    takes Miralax daily as well as Senokot daily  . DDD (degenerative disc disease)   . Gout    takes Allopurinol daily  . History of blood clots 1962   knee  . History of blood transfusion    no abnormal reaction noted  . History of shingles   . Hyperlipidemia    takes Atorvastatin daily  . Hypertension    takes Lisinopril daily  . OSA (obstructive sleep apnea)   . Osteoarthritis   . Peripheral edema    takes Furosemide daily  . Peripheral neuropathy (HCC)    takes Gabapentin daily  . Peroneal palsy    significant right foot drop  . Pneumonia 25+yrs ago   hx of  . Urinary frequency   . Urinary urgency   . Valvular heart disease     Past Surgical History:  Procedure Laterality  Date  . CARDIAC CATHETERIZATION N/A 05/02/2015   Procedure: Right/Left Heart Cath and Coronary Angiography;  Surgeon: Burnell Blanks, MD;  Location: [facility];  Service: Cardiovascular;  Laterality: N/A;  . COLONOSCOPY N/A 02/07/2013   Procedure: COLONOSCOPY;  Surgeon: Juanita Craver, MD;  Location[facility] END[facility]COPY;  Service: Endoscopy;  Laterality: N/A;  . COLONOSCOPY N/A 02/09/2013   Procedure: COLONOSCOPY;  Surgeon: Beryle Beams, MD;  Location: [place];  Service: Endoscopy;  Laterality: N/A;  . ESOPHAGOGASTRODUODENOSCOPY N/A 02/07/2013   Procedure: ESOPHAGOGASTRODUODENOSCOPY (EGD);  Surgeon: Juanita Craver, MD;  Location: [facility] ENDOSCOPY;  Service: Endoscopy;  Laterality: N/A;  . GIVENS CAPSULE STUDY N/A 02/09/2013   Procedure: GIVENS CAPSULE STUDY;  Surgeon: Beryle Beams, MD;  Location: [place];  Service: Endoscopy;  Laterality: N/A;  . HERNIA REPAIR     umbilical hernia  . KNEE SURGERY Right    multiple knee surgeries due to complication of R TKA  . LAMINOTOMY  [id number]   c6-t2  . SPINE SURGERY    . TEE WITHOUT CARDIOVERSION N/A 03/07/2015   Procedure: TRANSESOPHAGEAL ECHOCARDIOGRAM (TEE);  Surgeon: Josue Hector, MD;  Location: [place];  Service: Cardiovascular;  Laterality: N/A;  ANES  TO BRING PROPOFOL PER DOCTOR  . TEE WITHOUT CARDIOVERSION N/A 06/10/2015   Procedure: TRANSESOPHAGEAL ECHOCARDIOGRAM (TEE);  Surgeon: Burnell Blanks, MD;  Location: [place];  Service: Open Heart Surgery;  Laterality: N/A;  . TOTAL KNEE ARTHROPLASTY     right  . TRANSCATHETER AORTIC VALVE REPLACEMENT, TRANSFEMORAL Left 06/10/2015   Procedure: TRANSCATHETER AORTIC VALVE REPLACEMENT, TRANSFEMORAL;  Surgeon: Burnell Blanks, MD;  Location: [place];  Service: Open Heart Surgery;  Laterality: Left;    Current Outpatient Prescriptions  Medication Sig Dispense Refill  . acetaminophen (TYLENOL) 500 MG tablet Take 500 mg by mouth 2 (two) times daily.     [name] allopurinol (ZYLOPRIM) 300  MG tablet Take 1 tablet (300 mg total) by mouth daily. 30 tablet 6  . aspirin 81 MG tablet Take 1 tablet (81 mg total) by mouth daily. 30 tablet 11  . atorvastatin (LIPITOR) 40 MG tablet Take 40 mg by mouth daily.    [name] atorvastatin (LIPITOR) 40 MG tablet TAKE 1 TABLET BY MOUTH EVERY DAY 90 tablet 2  . cholecalciferol (VITAMIN D) 1000 UNITS tablet Take 2,000 Units by mouth daily.     . clopidogrel (PLAVIX) 75 MG tablet Take 1 tablet (75 mg total) by mouth daily with breakfast. 90 tablet 3  . Coenzyme Q10 (COQ10) 200 MG CAPS Take 200 mg by mouth daily.    . furosemide (LASIX) 20 MG tablet Take 1 tablet (20 mg total) by mouth 3 (three) times daily. 270 tablet 3  . gabapentin (NEURONTIN) 300 MG capsule TAKE 1 CAPSULE (300 MG TOTAL) BY MOUTH 2 (TWO) TIMES DAILY. 180 capsule 1  . hydrocortisone (ANUSOL-HC) 25 MG suppository Place 1 suppository (25 mg total) rectally 2 (two) times daily. 12 suppository 0  . lisinopril (PRINIVIL,ZESTRIL) 10 MG tablet Take 10 mg by mouth daily.    [name] loratadine (CLARITIN) 10 MG tablet Take 1 tablet (10 mg total) by mouth daily. 30 tablet 0  . Menthol-Methyl Salicylate (MUSCLE RUB) 10-15 % CREA Apply 1 application topically as directed.     . Multiple Vitamin (MULTIVITAMIN WITH MINERALS) TABS Take 1 tablet by mouth daily.    . polyethylene glycol (MIRALAX / GLYCOLAX) packet Take 17 g by mouth daily.    . sennosides-docusate sodium (SENOKOT-S) 8.6-50 MG tablet Take 1 tablet by mouth 2 (two) times daily.    . traMADol (ULTRAM) 50 MG tablet TAKE 1 TABLET BY MOUTH EVERY 6 HOURS AS NEEDED 90 tablet 0   No current facility-administered medications for this visit.     Allergies  Allergen Reactions  . Coumadin [Warfarin Sodium] Other (See Comments)    States he can't be on this-bleeds out    Social History   Social History  . Marital status: Married    Spouse name: N/A  . Number of children: N/A  . Years of education: N/A   Occupational History  . retired Dietitian    Social History Main Topics  . Smoking status: Former Smoker    Years: 30.00    Types: Pipe    Quit date: 09/20/2006  . Smokeless tobacco: Never Used  . Alcohol use No  . Drug use: No  . Sexual activity: No     Comment: lives with wife, retired, no dietary restrictions   Other Topics Concern  . Not on file   Social History Narrative  . No narrative on file    Family History  Problem Relation Age of Onset  . Arthritis Sister     "  crippling"  . Arthritis Sister     Review of Systems:  As stated in the HPI and otherwise negative.   BP 123/66 (BP Location: Left Arm, Patient Position: Sitting, Cuff Size: Large)   Pulse 82   Ht 6\' 2"  (1.88 m)   Wt (!) 385 lb (174.6 kg)   SpO2 93%   BMI 49.43 kg/m   Physical Examination: General: Morbidly obese WM in NAD HEENT: OP clear, mucus membranes moist SKIN: warm, dry. No rashes. Neuro: No focal deficits Musculoskeletal: Muscle strength 5/5 all ext Psychiatric: Mood and affect normal Neck: unable to assess for JVD, no carotid bruits. Lungs:Clear bilaterally, no wheezes, rhonci, crackles Cardiovascular: Regular rate and rhythm. Loud harsh systolic murmur. No gallops or rubs. Abdomen:Soft. Bowel sounds present. Non-tender.  Extremities: Plus 2 LE edema  With chronic stasis changes no erythema  Echo: 07/17/15:  Left ventricle: The cavity size was normal. Systolic function was normal. Wall motion was normal; there were no regional wall motion abnormalities. Doppler parameters are consistent with abnormal left ventricular relaxation (grade 1 diastolic dysfunction). - Aortic valve: s/p TAVR. Transvalvular velocity was within the normal range. There was no stenosis. There was no regurgitation. Peak velocity (S): 310 cm/s. Mean gradient (S): 21 mm Hg. VTI ratio of LVOT to aortic valve: 0.68. - Mitral valve: Severely calcified annulus. The findings are consistent with moderate to severe stenosis. Heart rate  46 bpm. Pressure half-time: 131 ms. Mean gradient (D): 10 mm Hg. Valve area by pressure half-time: 1.68 cm^2. - Left atrium: The atrium was severely dilated. - Right ventricle: The cavity size was normal. Wall thickness was normal. Systolic function was normal. - Tricuspid valve: There was no regurgitation. - Inferior vena cava: The vessel was normal in size. The respirophasic diameter changes were in the normal range (>= 50%), consistent with normal central venous pressure. - Poor image quality due to patient&'s inability to get out of the wheelchair. Images were obtained upright. - Mean gradient across the mitral valve is 10 mmHg (mild MS) compared with 12 mmHg (severe MS) in September, 2016. This may be attributable differences in heart rate and patient positioning.  EKG:    06/10/16  SR rate 86 RBBB stable   Recent Labs: 06/11/2015: Magnesium 2.2 11/07/2015: TSH 1.31 02/03/2016: ALT 20; BUN 41; Creatinine, Ser 1.24; Hemoglobin 11.5; Platelets 161.0; Potassium 5.4; Sodium 137   Lipid Panel    Component Value Date/Time   CHOL 137 11/07/2015 1412   TRIG 146.0 11/07/2015 1412   HDL 29.50 (L) 11/07/2015 1412   CHOLHDL 5 11/07/2015 1412   VLDL 29.2 11/07/2015 1412   LDLCALC 78 11/07/2015 1412     Wt Readings from Last 3 Encounters:  04/23/16 (!) 385 lb (174.6 kg)  04/14/16 (!) 380 lb (172.4 kg)  03/09/16 (!) 380 lb (172.4 kg)     Other studies Reviewed: Additional studies/ records that were reviewed today include: I have reviewed the echo images from today Review of the above records demonstrates: stable bioprosthetic valve   Assessment and Plan:   1. Severe aortic valve stenosis: s/p TAVR. Doing well. NYHA class 2 symptoms. Echo 07/17/15 shows normally functioning aortic valve prosthesis. He will continue ASA and Plavix. He will need antibiotic prophylaxis before dental or surgical procedures.  2. Cellulitis:  Resolved will take tid lasix when he travels to  minimize chance of  Skin break down 3. Chol:   Cholesterol is at goal.  Continue current dose of statin and diet Rx.  No  myalgias or side effects.  F/U  LFT's in 6 months. Lab Results  Component Value Date   LDLCALC 78 11/07/2015           Has f/u with CM September with echo    Jenkins Rouge

## 2016-04-23 ENCOUNTER — Ambulatory Visit (INDEPENDENT_AMBULATORY_CARE_PROVIDER_SITE_OTHER): Payer: PPO | Admitting: Cardiovascular Disease

## 2016-04-23 ENCOUNTER — Encounter: Payer: Self-pay | Admitting: Cardiovascular Disease

## 2016-04-23 VITALS — BP 123/66 | HR 82 | Ht 74.0 in | Wt 385.0 lb

## 2016-04-23 DIAGNOSIS — I350 Nonrheumatic aortic (valve) stenosis: Secondary | ICD-10-CM

## 2016-04-23 MED ORDER — CLOPIDOGREL BISULFATE 75 MG PO TABS: 75.0000 mg | ORAL_TABLET | Freq: Every day | ORAL | 3 refills | Status: DC

## 2016-04-23 MED ORDER — FUROSEMIDE 20 MG PO TABS: 20.0000 mg | ORAL_TABLET | Freq: Three times a day (TID) | ORAL | 3 refills | Status: DC

## 2016-04-23 NOTE — Patient Instructions (Addendum)
Medication Instructions:  Your physician recommends that you continue on your current medications as directed. Please refer to the Current Medication list given to you today.  Labwork: NONE  Testing/Procedures: Your physician has requested that you have an echocardiogram. Echocardiography is a painless test that uses sound waves to create images of your heart. It provides your doctor with information about the size and shape of your heart and how well your heart's chambers and valves are working. This procedure takes approximately one hour. There are no restrictions for this procedure.  Follow-Up: Your physician wants you to follow-up in: 6 months with Dr. Nishan. You will receive a reminder letter in the mail two months in advance. If you don't receive a letter, please call our office to schedule the follow-up appointment.   If you need a refill on your cardiac medications before your next appointment, please call your pharmacy.    

## 2016-04-24 ENCOUNTER — Telehealth: Payer: Self-pay | Admitting: Adult Health

## 2016-04-24 DIAGNOSIS — G4733 Obstructive sleep apnea (adult) (pediatric): Secondary | ICD-10-CM | POA: Diagnosis not present

## 2016-04-24 NOTE — Telephone Encounter (Signed)
Please place order to change CPAP setting to 22cm H20.  This shows better control of OSA Download in 4 weeks .

## 2016-04-24 NOTE — Procedures (Signed)
Patient Name: [name], [name] Date: 04/14/2016 Gender: Male D.O.B: 1941-06-25 Age (years): 27 Referring Provider: Lynelle Smoke Parrett Height (inches): 74 Interpreting Physician: Baird Lyons MD, ABSM Weight (lbs): 380 RPSGT: Laren Everts BMI: 47 MRN: [id number] Neck Size: 20.00 CLINICAL INFORMATION The patient is referred for a CPAP titration to treat sleep apnea.   Date of NPSG, Split Night or HST:   NPSG 06/21/85- AHI 98/ hr, desaturation to 64%  SLEEP STUDY TECHNIQUE As per the AASM Manual for the Scoring of Sleep and Associated Events v2.3 (April 2016) with a hypopnea requiring 4% desaturations. The channels recorded and monitored were frontal, central and occipital EEG, electrooculogram (EOG), submentalis EMG (chin), nasal and oral airflow, thoracic and abdominal wall motion, anterior tibialis EMG, snore microphone, electrocardiogram, and pulse oximetry. Continuous positive airway pressure (CPAP) was initiated at the beginning of the study and titrated to treat sleep-disordered breathing.  MEDICATIONS Medications taken by the patient : charted for review Medications administered by patient during sleep study : No sleep medicine administered.  TECHNICIAN COMMENTS Comments added by technician: Patient had difficulty initiating sleep. Patient was restless all through the night. Patient had more than two awakenings to use the bathroom  Comments added by scorer: N/A  RESPIRATORY PARAMETERS Optimal PAP Pressure (cm): 22 AHI at Optimal Pressure (/hr): 2.4 Overall Minimal O2 (%): 88.00 Supine % at Optimal Pressure (%): 100 Minimal O2 at Optimal Pressure (%): 90.0    SLEEP ARCHITECTURE The study was initiated at 10:13:04 PM and ended at 4:57:08 AM. Sleep onset time was 74.4 minutes and the sleep efficiency was 30.6%. The total sleep time was 123.5 minutes. The patient spent 50.20% of the night in stage N1 sleep, 48.99% in stage N2 sleep, 0.00% in stage N3 and 0.81% in  REM.Stage REM latency was 212.5 minutes Wake after sleep onset was 206.2. Alpha intrusion was absent. Supine sleep was 100.00%.  CARDIAC DATA The 2 lead EKG demonstrated sinus rhythm. The mean heart rate was 65.39 beats per minute. Other EKG findings include: None.  LEG MOVEMENT DATA The total Periodic Limb Movements of Sleep (PLMS) were 4. The PLMS index was 1.94. A PLMS index of <15 is considered normal in adults.  IMPRESSIONS - The optimal PAP pressure was 22 cm of water. - Central sleep apnea was not noted during this titration (CAI = 1.0/h). - Mild oxygen desaturations were observed during this titration (min O2 = 88.00%). - The patient snored with Moderate snoring volume during this titration study. - No cardiac abnormalities were observed during this study. - Clinically significant periodic limb movements were not noted during this study. Arousals associated with PLMs were rare.  DIAGNOSIS - Obstructive Sleep Apnea (327.23 [G47.33 ICD-10])  RECOMMENDATIONS - Trial of CPAP therapy on 22 cm H2O with a Large size Resmed Full Face Mask AirFit F20 mask and heated humidification.   - This pressure range may require a Bilevel machine, eg 22/15, for comfort. - Avoid alcohol, sedatives and other CNS depressants that may worsen sleep apnea and disrupt normal sleep architecture. - Sleep hygiene should be reviewed to assess factors that may improve sleep quality. - Sleep quality was significant for restlessness during this study. - Weight management and regular exercise should be initiated or continued.  [Electronically signed] 04/24/2016 01:37 PM  Baird Lyons MD, [place], American Board of Sleep Medicine   NPI: [id number]  [place], American Board of Sleep Medicine  ELECTRONICALLY SIGNED ON:  04/24/2016, 1:32 PM [place]  PH: [phone]   FX: [phone] [place]

## 2016-04-25 ENCOUNTER — Other Ambulatory Visit: Payer: Self-pay | Admitting: Family Medicine

## 2016-04-26 NOTE — Telephone Encounter (Signed)
Order has been placed. Nothing further needed. 

## 2016-05-04 ENCOUNTER — Telehealth: Payer: Self-pay | Admitting: Adult Health

## 2016-05-04 DIAGNOSIS — G4733 Obstructive sleep apnea (adult) (pediatric): Secondary | ICD-10-CM

## 2016-05-04 NOTE — Telephone Encounter (Signed)
Spoke with  Melissa  With [facility] who states pt pressure is set at 20 which is as high as it will go. We placed an order for pressure change of 22 on 04/26/16  TP please. Thanks.

## 2016-05-05 ENCOUNTER — Ambulatory Visit: Payer: PPO | Admitting: Internal Medicine

## 2016-05-07 NOTE — Telephone Encounter (Signed)
Please set up

## 2016-05-07 NOTE — Telephone Encounter (Signed)
Melissa called back and she stated that the pt will have to be sent to the sleep lab to be titrated for the BiPap.  He has to be able to show that he cannot tolerate the cpap and will need the bipap for insurance reasons.  thanks

## 2016-05-07 NOTE — Telephone Encounter (Signed)
TP please advise. thanks 

## 2016-05-07 NOTE — Telephone Encounter (Signed)
He had a CPAP titration study that showed he was not controlled on 20cm with AHI ~50.  So it was increased to 22cm which [facility] says CPAP does not do ???????

## 2016-05-07 NOTE — Telephone Encounter (Signed)
So pressure requirements are too high for CPAP  Will need to change to BIPAP , start on 24/18  Ov with download in 4 weeks

## 2016-05-07 NOTE — Telephone Encounter (Signed)
Per Melissa:  CPAP will not go higher than 20cw. Patient needs to have a BiPAP titration study.  Needs to make sure that they are aware that patient needs to try CPAP and move to BiPAP during study.    TP, please advise.

## 2016-05-07 NOTE — Telephone Encounter (Signed)
Patient notified that he will need BiPAP Titration study.  Order entered. Patient concerned that they will start pressure at 5 and he cannot go that low or he will not be able to breath and will have panic attack.  Placed instructions in order for pressure to be set high so patient can breath.  Patient said that he will not undergo the test if they don't put the setting high to start.  Advised him that I would put a request in the order.  Nothing further needed.

## 2016-05-07 NOTE — Telephone Encounter (Signed)
The CPAP titration did show he is already on 20cm and is not controlled

## 2016-05-07 NOTE — Telephone Encounter (Signed)
Waiting for Melissa to call me back about this pt.[name]

## 2016-05-10 ENCOUNTER — Ambulatory Visit: Payer: PPO | Admitting: Internal Medicine

## 2016-05-19 ENCOUNTER — Telehealth: Payer: Self-pay | Admitting: Adult Health

## 2016-05-19 DIAGNOSIS — G4733 Obstructive sleep apnea (adult) (pediatric): Secondary | ICD-10-CM

## 2016-05-19 NOTE — Telephone Encounter (Signed)
Vernon from the sleep lab called to ask about order that was placed for the pt.  He stated that the order put in was for bipap titration but in the notes it stated that the pt needed to be started on the cpap first.  He stated that the order should be changed to cpap titration and in the scheduling notes, document that pt will need to be transitioned to bi-level if he cannot tolerate the cpap.  He also stated that they will not be able to start him out on the 20 cwp as ordered.  Will forward to TP to advise if ok to change the order for this pt.  Please advise. thanks

## 2016-05-20 NOTE — Telephone Encounter (Signed)
No thanks, I will speak to Dr. Annamaria Boots  When he gets back as I have already ordered a CPAP titration study and he was suppose this was suppose to be done at that visit. Instead they said to use CPAP at 22cm which is not capable on his CPAP /DME will not do so they said he has to go to BIPAP but needs a BIPAP titration study first to qualify him.  Will see if Dr. Annamaria Boots  Can speak with the sleep director to see why pt will be charged twice for this .  Please let them know of my concerns.

## 2016-05-20 NOTE — Telephone Encounter (Signed)
LMTCB for [name]  He will be back in the office on next mon

## 2016-05-23 ENCOUNTER — Other Ambulatory Visit: Payer: Self-pay | Admitting: Family Medicine

## 2016-05-23 ENCOUNTER — Ambulatory Visit (HOSPITAL_BASED_OUTPATIENT_CLINIC_OR_DEPARTMENT_OTHER): Payer: PPO | Attending: Adult Health | Admitting: Internal Medicine

## 2016-05-23 DIAGNOSIS — G4733 Obstructive sleep apnea (adult) (pediatric): Secondary | ICD-10-CM | POA: Diagnosis not present

## 2016-05-26 NOTE — Telephone Encounter (Signed)
Received call from [place] at the sleep lab. He states that patient needs a bi-level titration done since patient is unsuccessful on CPAP.  Spoke with TP  Per verbal order  Placed order for bipap titration.    Order has been placed. Spoke with Lynnae Sandhoff and made him aware that order was placed. He voiced understanding and had no further questions. Nothing further needed.

## 2016-06-01 ENCOUNTER — Other Ambulatory Visit: Payer: Self-pay | Admitting: Physician Assistant

## 2016-06-05 ENCOUNTER — Other Ambulatory Visit: Payer: Self-pay | Admitting: Family Medicine

## 2016-06-07 NOTE — Telephone Encounter (Signed)
Faxed hardcopy for tramadol to [facility] in [place].

## 2016-06-11 ENCOUNTER — Ambulatory Visit (INDEPENDENT_AMBULATORY_CARE_PROVIDER_SITE_OTHER): Payer: PPO | Admitting: Cardiovascular Disease

## 2016-06-11 ENCOUNTER — Ambulatory Visit (HOSPITAL_COMMUNITY): Payer: PPO | Attending: Cardiology

## 2016-06-11 ENCOUNTER — Encounter: Payer: Self-pay | Admitting: Cardiovascular Disease

## 2016-06-11 ENCOUNTER — Other Ambulatory Visit: Payer: Self-pay

## 2016-06-11 VITALS — BP 108/60 | HR 68 | Ht 74.0 in

## 2016-06-11 DIAGNOSIS — Z954 Presence of other heart-valve replacement: Secondary | ICD-10-CM | POA: Diagnosis not present

## 2016-06-11 DIAGNOSIS — Z953 Presence of xenogenic heart valve: Secondary | ICD-10-CM

## 2016-06-11 DIAGNOSIS — I359 Nonrheumatic aortic valve disorder, unspecified: Secondary | ICD-10-CM

## 2016-06-11 DIAGNOSIS — Z993 Dependence on wheelchair: Secondary | ICD-10-CM | POA: Insufficient documentation

## 2016-06-11 DIAGNOSIS — I350 Nonrheumatic aortic (valve) stenosis: Secondary | ICD-10-CM | POA: Diagnosis not present

## 2016-06-11 NOTE — Progress Notes (Signed)
Chief Complaint  Patient presents with  . Aortic Stenosis     History of Present Illness: 75 yo [name] with history of morbid obesity, mild HTN, HLD, gout and severe aortic stenosis who is here today for TAVR one year follow up. His cardiac issues are followed by Dr. [name]. He underwent TAVR on 06/10/15. He did well following the procedure. There were no complications. He tells me today that he is feeling well. His breathing has improved. He has no pain.He is still limited by chronic joint issues. He is in a wheelchair. He has seen a marked improvement in breathing since his valve was replaced.   Primary Care Physician: Penni Homans, MD Primary Cardiology: [name]  Past Medical History:  Diagnosis Date  . Anemia   . Aortic stenosis   . Aortic stenosis, severe    S/p Edwards Sapien 3 Transcatheter Heart Valve (size 26 mm, model # [id number], serial # [id number])  . Arthritis   . Bursitis   . Cataract    left immature  . Cellulitis 10/12/2015  . Complication of anesthesia    Halucinations  . Constipation    takes Miralax daily as well as Senokot daily  . DDD (degenerative disc disease)   . Gout    takes Allopurinol daily  . History of blood clots 1962   knee  . History of blood transfusion    no abnormal reaction noted  . History of shingles   . Hyperlipidemia    takes Atorvastatin daily  . Hypertension    takes Lisinopril daily  . OSA (obstructive sleep apnea)   . Osteoarthritis   . Peripheral edema    takes Furosemide daily  . Peripheral neuropathy (HCC)    takes Gabapentin daily  . Peroneal palsy    significant right foot drop  . Pneumonia 25+yrs ago   hx of  . Urinary frequency   . Urinary urgency   . Valvular heart disease     Past Surgical History:  Procedure Laterality Date  . CARDIAC CATHETERIZATION N/A 05/02/2015   Procedure: Right/Left Heart Cath and Coronary Angiography;  Surgeon: Burnell Blanks, MD;  Location: [facility];  Service:  Cardiovascular;  Laterality: N/A;  . COLONOSCOPY N/A 02/07/2013   Procedure: COLONOSCOPY;  Surgeon: Juanita Craver, MD;  Location: [facility] ENDOSCOPY;  Service: Endoscopy;  Laterality: N/A;  . COLONOSCOPY N/A 02/09/2013   Procedure: COLONOSCOPY;  Surgeon: Beryle Beams, MD;  Location: [place];  Service: Endoscopy;  Laterality: N/A;  . ESOPHAGOGASTRODUODENOSCOPY N/A 02/07/2013   Procedure: ESOPHAGOGASTRODUODENOSCOPY (EGD);  Surgeon: Juanita Craver, MD;  Location: [facility] ENDOSCOPY;  Service: Endoscopy;  Laterality: N/A;  . GIVENS CAPSULE STUDY N/A 02/09/2013   Procedure: GIVENS CAPSULE STUDY;  Surgeon: Beryle Beams, MD;  Location: [place];  Service: Endoscopy;  Laterality: N/A;  . HERNIA REPAIR     umbilical hernia  . KNEE SURGERY Right    multiple knee surgeries due to complication of R TKA  . LAMINOTOMY  7096   c6-t2  . SPINE SURGERY    . TEE WITHOUT CARDIOVERSION N/A 03/07/2015   Procedure: TRANSESOPHAGEAL ECHOCARDIOGRAM (TEE);  Surgeon: Josue Hector, MD;  Location: [facility] ENDOSCOPY;  Service: Cardiovascular;  Laterality: N/A;  ANES TO BRING PROPOFOL PER DOCTOR  . TEE WITHOUT CARDIOVERSION N/A 06/10/2015   Procedure: TRANSESOPHAGEAL ECHOCARDIOGRAM (TEE);  Surgeon: Burnell Blanks, MD;  Location: [place];  Service: Open Heart Surgery;  Laterality: N/A;  . TOTAL KNEE ARTHROPLASTY  right  . TRANSCATHETER AORTIC VALVE REPLACEMENT, TRANSFEMORAL Left 06/10/2015   Procedure: TRANSCATHETER AORTIC VALVE REPLACEMENT, TRANSFEMORAL;  Surgeon: Burnell Blanks, MD;  Location: [place];  Service: Open Heart Surgery;  Laterality: Left;    Current Outpatient Prescriptions  Medication Sig Dispense Refill  . acetaminophen (TYLENOL) 500 MG tablet Take 500 mg by mouth 2 (two) times daily.     [name] allopurinol (ZYLOPRIM) 300 MG tablet TAKE 1 TABLET (300 MG TOTAL) BY MOUTH DAILY. 30 tablet 6  . aspirin 81 MG EC tablet TAKE 1 TABLET (81 MG TOTAL) BY MOUTH DAILY. 30 tablet 11  . atorvastatin (LIPITOR) 40 MG  tablet Take 40 mg by mouth daily.    . cholecalciferol (VITAMIN D) 1000 UNITS tablet Take 2,000 Units by mouth daily.     . clopidogrel (PLAVIX) 75 MG tablet Take 1 tablet (75 mg total) by mouth daily with breakfast. 90 tablet 3  . Coenzyme Q10 (COQ10) 200 MG CAPS Take 200 mg by mouth daily.    . furosemide (LASIX) 20 MG tablet Take 1 tablet (20 mg total) by mouth 3 (three) times daily. 270 tablet 3  . gabapentin (NEURONTIN) 300 MG capsule TAKE 1 CAPSULE (300 MG TOTAL) BY MOUTH 2 (TWO) TIMES DAILY. 180 capsule 1  . hydrocortisone (ANUSOL-HC) 25 MG suppository Place 1 suppository (25 mg total) rectally 2 (two) times daily. 12 suppository 0  . lisinopril (PRINIVIL,ZESTRIL) Lance MG tablet Take Lance mg by mouth daily.    [name] loratadine (CLARITIN) Lance MG tablet Take 1 tablet (Lance mg total) by mouth daily. 30 tablet 0  . Menthol-Methyl Salicylate (MUSCLE RUB) Lance-15 % CREA Apply 1 application topically as directed.     . Multiple Vitamin (MULTIVITAMIN WITH MINERALS) TABS Take 1 tablet by mouth daily.    . polyethylene glycol (MIRALAX / GLYCOLAX) packet Take 17 g by mouth daily.    . sennosides-docusate sodium (SENOKOT-S) 8.6-50 MG tablet Take 1 tablet by mouth 2 (two) times daily.    . traMADol (ULTRAM) 50 MG tablet Take by mouth every 6 (six) hours as needed (pain).     No current facility-administered medications for this visit.     Allergies  Allergen Reactions  . Coumadin [Warfarin Sodium] Other (See Comments)    States he can't be on this-bleeds out    Social History   Social History  . Marital status: Married    Spouse name: N/A  . Number of children: N/A  . Years of education: N/A   Occupational History  . retired Administrator    Social History Main Topics  . Smoking status: Former Smoker    Years: 30.00    Types: Pipe    Quit date: 09/20/2006  . Smokeless tobacco: Never Used  . Alcohol use No  . Drug use: No  . Sexual activity: No     Comment: lives with wife, retired, no dietary  restrictions   Other Topics Concern  . Not on file   Social History Narrative  . No narrative on file    Family History  Problem Relation Age of Onset  . Arthritis Sister     "crippling"  . Arthritis Sister     Review of Systems:  As stated in the HPI and otherwise negative.   BP 108/60   Pulse 68   Ht 6\' 2"  (1.88 m)   Physical Examination: General: Morbidly obese WM in NAD  HEENT: OP clear, mucus membranes moist  SKIN: warm, dry. No rashes. Neuro:  No focal deficits  Musculoskeletal: Muscle strength 5/5 all ext  Psychiatric: Mood and affect normal  Neck: unable to assess for JVD, no carotid bruits.  Lungs:Clear bilaterally, no wheezes, rhonci, crackles Cardiovascular: Regular rate and rhythm. Loud harsh systolic murmur. No gallops or rubs. Abdomen:Soft. Bowel sounds present. Non-tender.  Extremities: 1-2 + bilateral lower extremity edema with chronic venous stasis changes. Pulses are 2 + in the bilateral DP/PT.  Echo today: I have reviewed this echo with our reading team today here in the office. LVEF is normal. Quality of study is poor. Gradient across the AV is mildly elevated as to be expected. The valve is poorly visualized. Trivial AI Full echo report to follow  EKG:  EKG is  ordered today. The ekg ordered today demonstrates sinus rhythm with 1st degree AV block. Incomplete RBBB  Recent Labs: 11/07/2015: TSH 1.31 02/03/2016: ALT 20; BUN 41; Creatinine, Ser 1.24; Hemoglobin 11.5; Platelets 161.0; Potassium 5.4; Sodium 137   Lipid Panel    Component Value Date/Time   CHOL 137 11/07/2015 1412   TRIG 146.0 11/07/2015 1412   HDL 29.50 (L) 11/07/2015 1412   CHOLHDL 5 11/07/2015 1412   VLDL 29.2 11/07/2015 1412   LDLCALC 78 11/07/2015 1412     Wt Readings from Last 3 Encounters:  04/23/16 (!) 174.6 kg (385 lb)  04/14/16 (!) 172.4 kg (380 lb)  03/09/16 (!) 172.4 kg (380 lb)     Other studies Reviewed: Additional studies/ records that were reviewed today  include: I have reviewed the echo images from today Review of the above records demonstrates: stable bioprosthetic valve   Assessment and Plan:   1. Severe aortic valve stenosis: s/p TAVR one year ago. Doing well. NYHA class 2 symptoms. Echo today shows normally functioning aortic valve prosthesis with expected gradient across valve, trivial AI. Image quality is poor. He will continue ASA and Plavix. He will need antibiotic prophylaxis before dental or surgical procedures. Follow up with Dr. [name]  Current medicines are reviewed at length with the patient today.  The patient does not have concerns regarding medicines.  The following changes have been made:  no change  Labs/ tests ordered today include:   Orders Placed This Encounter  Procedures  . EKG 12-Lead    Disposition:   FU with Dr. [name] as scheduled.     Signed, Lauree Chandler, MD 06/11/2016 1:40 PM    [facility] [address], [place], [place]  [address] Phone: [phone]; Fax: [phone]

## 2016-06-11 NOTE — Patient Instructions (Signed)
Medication Instructions:  Your physician recommends that you continue on your current medications as directed. Please refer to the Current Medication list given to you today.   Labwork: none  Testing/Procedures: none  Follow-Up: Follow up with Dr. [name] as planned (February 2018)  Any Other Special Instructions Will Be Listed Below (If Applicable).     If you need a refill on your cardiac medications before your next appointment, please call your pharmacy.

## 2016-06-22 ENCOUNTER — Ambulatory Visit: Payer: PPO

## 2016-06-22 ENCOUNTER — Ambulatory Visit (INDEPENDENT_AMBULATORY_CARE_PROVIDER_SITE_OTHER): Payer: PPO

## 2016-06-22 DIAGNOSIS — Z23 Encounter for immunization: Secondary | ICD-10-CM

## 2016-06-22 DIAGNOSIS — G4733 Obstructive sleep apnea (adult) (pediatric): Secondary | ICD-10-CM | POA: Diagnosis not present

## 2016-06-24 DIAGNOSIS — H25813 Combined forms of age-related cataract, bilateral: Secondary | ICD-10-CM | POA: Diagnosis not present

## 2016-06-24 DIAGNOSIS — H52223 Regular astigmatism, bilateral: Secondary | ICD-10-CM | POA: Diagnosis not present

## 2016-06-24 DIAGNOSIS — H5203 Hypermetropia, bilateral: Secondary | ICD-10-CM | POA: Diagnosis not present

## 2016-06-29 ENCOUNTER — Ambulatory Visit (HOSPITAL_BASED_OUTPATIENT_CLINIC_OR_DEPARTMENT_OTHER): Payer: PPO | Attending: Adult Health | Admitting: Internal Medicine

## 2016-06-29 DIAGNOSIS — G4733 Obstructive sleep apnea (adult) (pediatric): Secondary | ICD-10-CM | POA: Insufficient documentation

## 2016-07-03 DIAGNOSIS — G4733 Obstructive sleep apnea (adult) (pediatric): Secondary | ICD-10-CM | POA: Diagnosis not present

## 2016-07-03 NOTE — Procedures (Signed)
  Patient Name: [name], [name] Date: 06/29/2016 Gender: Male D.O.B: 1941/02/28 Age (years): 72 Referring Provider: Lynelle Smoke Parrett Height (inches): 74 Interpreting Physician: Baird Lyons MD, ABSM Weight (lbs): 380 RPSGT: Laren Everts BMI: 84 MRN: [id number] Neck Size: 20.50 CLINICAL INFORMATION The patient is referred for a BiPAP titration to treat sleep apnea.   Date of NPSG, Split Night or HST: Diagnostic NPSG 06/21/85  AHI 98/ hr, desaturation to 64%  SLEEP STUDY TECHNIQUE As per the AASM Manual for the Scoring of Sleep and Associated Events v2.3 (April 2016) with a hypopnea requiring 4% desaturations. The channels recorded and monitored were frontal, central and occipital EEG, electrooculogram (EOG), submentalis EMG (chin), nasal and oral airflow, thoracic and abdominal wall motion, anterior tibialis EMG, snore microphone, electrocardiogram, and pulse oximetry. Bilevel positive airway pressure (BPAP) was initiated at the beginning of the study and titrated to treat sleep-disordered breathing.  MEDICATIONS Medications self-administered by patient taken the night of the study : none reported  RESPIRATORY PARAMETERS Optimal IPAP Pressure (cm): 21 AHI at Optimal Pressure (/hr) 3.3 Optimal EPAP Pressure (cm): 17   Overall Minimal O2 (%): 69.00 Minimal O2 at Optimal Pressure (%): 88.0  SLEEP ARCHITECTURE Start Time: 9:51:06 PM Stop Time: 4:32:38 AM Total Time (min): 401.5 Total Sleep Time (min): 220.0 Sleep Latency (min): 6.9 Sleep Efficiency (%): 54.8 REM Latency (min): 197.0 WASO (min): 174.6 Stage N1 (%): 35.68 Stage N2 (%): 42.50 Stage N3 (%): 0.00 Stage R (%): 21.82 Supine (%): 100.00 Arousal Index (/hr): 58.1      CARDIAC DATA The 2 lead EKG demonstrated sinus rhythm. The mean heart rate was 65.57 beats per minute. Other EKG findings include: None.  LEG MOVEMENT DATA The total Periodic Limb Movements of Sleep (PLMS) were 107. The PLMS index was 29.18. A PLMS index  of <15 is considered normal in adults.  IMPRESSIONS - An optimal BiPAP pressure was selected for this patient ( 21 / 17 cm of water) - Mild Central Sleep Apnea was noted during this titration (CAI = 9.0/h). - Severe oxygen desaturations were observed during this titration (min O2 = 69.00%). - The patient snored with Moderate snoring volume. - No cardiac abnormalities were observed during this study. - Moderate periodic limb movements were observed during this study. Arousals associated with PLMs were rare.  DIAGNOSIS - Obstructive Sleep Apnea (327.23 [G47.33 ICD-10])  RECOMMENDATIONS - Trial of BiPAP therapy on 21/17 cm H2O with a Large size Resmed Nasal Mask Airfit N20 mask and heated humidification. - Avoid alcohol, sedatives and other CNS depressants that may worsen sleep apnea and disrupt normal sleep architecture. - Sleep hygiene should be reviewed to assess factors that may improve sleep quality. - Weight management and regular exercise should be initiated or continued.  [Electronically signed] 07/03/2016 01:40 PM  Baird Lyons MD, [place], American Board of Sleep Medicine   NPI: [id number]  [place], American Board of Sleep Medicine  ELECTRONICALLY SIGNED ON:  07/03/2016, 1:37 PM [place] PH: [phone]   FX: [phone] [place]

## 2016-07-09 ENCOUNTER — Telehealth: Payer: Self-pay | Admitting: Adult Health

## 2016-07-09 DIAGNOSIS — G4733 Obstructive sleep apnea (adult) (pediatric): Secondary | ICD-10-CM

## 2016-07-09 NOTE — Telephone Encounter (Signed)
Called spoke with pt. Reviewed results and recs. Pt refused ONO at this time. Scheduled ov with TP on 08/16/16. He voiced understanding and had no further questions. Order placed for BIPAP

## 2016-07-09 NOTE — Progress Notes (Signed)
Called spoke with pt. Reviewed results and recs. Pt refused ONO at this time. Scheduled ov with TP on 08/16/16. He voiced understanding and had no further questions. Order placed for BIPAP

## 2016-07-09 NOTE — Telephone Encounter (Signed)
Notes Recorded by Melvenia Needles, NP on 07/09/2016 at 4:19 PM EDT Will need to change to BIPAP 21/17cmH20  Ov in 4 weeks with download.  Set up ONO on BIPAP at 2 weeks (once BIPAP started ) .   Notes Recorded by Melvenia Needles, NP on 07/09/2016 at 4:19 PM EDT Will need to change to BIPAP 21/17cmH20  Ov in 4 weeks with download.  Set up ONO on BIPAP at 2 weeks (once BIPAP started ) .

## 2016-07-22 DIAGNOSIS — G4733 Obstructive sleep apnea (adult) (pediatric): Secondary | ICD-10-CM | POA: Diagnosis not present

## 2016-07-23 DIAGNOSIS — G4733 Obstructive sleep apnea (adult) (pediatric): Secondary | ICD-10-CM | POA: Diagnosis not present

## 2016-07-26 ENCOUNTER — Other Ambulatory Visit: Payer: Self-pay | Admitting: Family Medicine

## 2016-07-29 ENCOUNTER — Encounter: Payer: Self-pay | Admitting: Family Medicine

## 2016-07-29 ENCOUNTER — Ambulatory Visit (INDEPENDENT_AMBULATORY_CARE_PROVIDER_SITE_OTHER): Payer: PPO | Admitting: Family Medicine

## 2016-07-29 DIAGNOSIS — E7800 Pure hypercholesterolemia, unspecified: Secondary | ICD-10-CM

## 2016-07-29 DIAGNOSIS — I350 Nonrheumatic aortic (valve) stenosis: Secondary | ICD-10-CM

## 2016-07-29 DIAGNOSIS — G4733 Obstructive sleep apnea (adult) (pediatric): Secondary | ICD-10-CM

## 2016-07-29 DIAGNOSIS — D649 Anemia, unspecified: Secondary | ICD-10-CM | POA: Diagnosis not present

## 2016-07-29 DIAGNOSIS — M1A9XX0 Chronic gout, unspecified, without tophus (tophi): Secondary | ICD-10-CM

## 2016-07-29 LAB — CBC
HCT: 36.3 % — ABNORMAL LOW (ref 39.0–52.0)
Hemoglobin: 11.9 g/dL — ABNORMAL LOW (ref 13.0–17.0)
MCHC: 32.8 g/dL (ref 30.0–36.0)
MCV: 93.7 fl (ref 78.0–100.0)
Platelets: 152 10*3/uL (ref 150.0–400.0)
RBC: 3.87 Mil/uL — ABNORMAL LOW (ref 4.22–5.81)
RDW: 17.3 % — ABNORMAL HIGH (ref 11.5–15.5)
WBC: 8.2 10*3/uL (ref 4.0–10.5)

## 2016-07-29 LAB — COMPREHENSIVE METABOLIC PANEL
ALT: 20 U/L (ref 0–53)
AST: 19 U/L (ref 0–37)
Albumin: 3.9 g/dL (ref 3.5–5.2)
Alkaline Phosphatase: 80 U/L (ref 39–117)
BUN: 34 mg/dL — ABNORMAL HIGH (ref 6–23)
CO2: 31 mEq/L (ref 19–32)
Calcium: 9.6 mg/dL (ref 8.4–10.5)
Chloride: 101 mEq/L (ref 96–112)
Creatinine, Ser: 1.23 mg/dL (ref 0.40–1.50)
GFR: 61.14 mL/min (ref >60.00)
Glucose, Bld: 93 mg/dL (ref 70–99)
Potassium: 5.4 mEq/L — ABNORMAL HIGH (ref 3.5–5.1)
Sodium: 138 mEq/L (ref 135–145)
Total Bilirubin: 0.4 mg/dL (ref 0.2–1.2)
Total Protein: 6.9 g/dL (ref 6.0–8.3)

## 2016-07-29 LAB — LIPID PANEL
Cholesterol: 143 mg/dL (ref 0–200)
HDL: 28.5 mg/dL — ABNORMAL LOW (ref >39.00)
LDL Cholesterol: 75 mg/dL (ref 0–99)
NonHDL: 114.39
Total CHOL/HDL Ratio: 5
Triglycerides: 197 mg/dL — ABNORMAL HIGH (ref 0.0–149.0)
VLDL: 39.4 mg/dL (ref 0.0–40.0)

## 2016-07-29 LAB — TSH: TSH: 1.37 u[IU]/mL (ref 0.35–4.50)

## 2016-07-29 NOTE — Progress Notes (Signed)
Pre visit review using our clinic review tool, if applicable. No additional management support is needed unless otherwise documented below in the visit note. 

## 2016-07-29 NOTE — Patient Instructions (Signed)

## 2016-07-29 NOTE — Assessment & Plan Note (Signed)
Check uric acid level, no recent flares

## 2016-07-29 NOTE — Assessment & Plan Note (Signed)
Tolerating statin, encouraged heart healthy diet, avoid trans fats, minimize simple carbs and saturated fats. Increase exercise as tolerated 

## 2016-07-29 NOTE — Assessment & Plan Note (Signed)
Has recently been placed on a new Bipap machine. Is doing well.

## 2016-07-29 NOTE — Assessment & Plan Note (Signed)
Check CBC, mild

## 2016-07-29 NOTE — Assessment & Plan Note (Signed)
Follows with cardiology 

## 2016-08-08 NOTE — Assessment & Plan Note (Signed)
Encouraged DASH diet, decrease po intake and increase exercise as tolerated. Needs 7-8 hours of sleep nightly. Avoid trans fats, eat small, frequent meals every 4-5 hours with lean proteins, complex carbs and healthy fats. Minimize simple carbs 

## 2016-08-08 NOTE — Progress Notes (Signed)
Patient ID: [name], male   DOB: 02/13/1941, 76 y.o.   MRN: [id number]   Subjective:    Patient ID: [name], male    DOB: 1941/07/08, 75 y.o.   MRN: [id number]  Chief Complaint  Patient presents with  . Follow-up    HPI Patient is in today for follow up. He is accompanied by his wife. He continues to do well at home. No recent illness or hospitalizations. No recent flare in gout. Continues to be primarily wheelchair bound and struggle with edema. Denies CP/palp/HA/congestion/fevers/GI or GU c/o. Taking meds as prescribed  Past Medical History:  Diagnosis Date  . Anemia   . Aortic stenosis   . Aortic stenosis, severe    S/p Edwards Sapien 3 Transcatheter Heart Valve (size 26 mm, model # [id number], serial # [id number])  . Arthritis   . Bursitis   . Cataract    left immature  . Cellulitis 10/12/2015  . Complication of anesthesia    Halucinations  . Constipation    takes Miralax daily as well as Senokot daily  . DDD (degenerative disc disease)   . Gout    takes Allopurinol daily  . History of blood clots 1962   knee  . History of blood transfusion    no abnormal reaction noted  . History of shingles   . Hyperlipidemia    takes Atorvastatin daily  . Hypertension    takes Lisinopril daily  . OSA (obstructive sleep apnea)   . Osteoarthritis   . Peripheral edema    takes Furosemide daily  . Peripheral neuropathy (HCC)    takes Gabapentin daily  . Peroneal palsy    significant right foot drop  . Pneumonia 25+yrs ago   hx of  . Urinary frequency   . Urinary urgency   . Valvular heart disease     Past Surgical History:  Procedure Laterality Date  . CARDIAC CATHETERIZATION N/A 05/02/2015   Procedure: Right/Left Heart Cath and Coronary Angiography;  Surgeon: Burnell Blanks, MD;  Location: [place] [facility];  Service: Cardiovascular;  Laterality: N/A;  . COLONOSCOPY N/A 02/07/2013   Procedure: COLONOSCOPY;  Surgeon: Juanita Craver, MD;  Location: [facility]  ENDOSCOPY;  Service: Endoscopy;  Laterality: N/A;  . COLONOSCOPY N/A 02/09/2013   Procedure: COLONOSCOPY;  Surgeon: Beryle Beams, MD;  Location: [place];  Service: Endoscopy;  Laterality: N/A;  . ESOPHAGOGASTRODUODENOSCOPY N/A 02/07/2013   Procedure: ESOPHAGOGASTRODUODENOSCOPY (EGD);  Surgeon: Juanita Craver, MD;  Location: [facility]OSCOPY;  Service: Endoscopy;  Laterality: N/A;  . GIVENS CAPSULE STUDY N/A 02/09/2013   Procedure: GIVENS CAPSULE STUDY;  Surgeon: Beryle Beams, MD;  Location: [place];  Service: Endoscopy;  Laterality: N/A;  . HERNIA REPAIR     umbilical hernia  . KNEE SURGERY Right    multiple knee surgeries due to complication of R TKA  . LAMINOTOMY  7672   c6-t2  . SPINE SURGERY    . TEE WITHOUT CARDIOVERSION N/A 03/07/2015   Procedure: TRANSESOPHAGEAL ECHOCARDIOGRAM (TEE);  Surgeon: Josue Hector, MD;  Location: [facility] ENDOSCOPY;  Service: Cardiovascular;  Laterality: N/A;  ANES TO BRING PROPOFOL PER DOCTOR  . TEE WITHOUT CARDIOVERSION N/A 06/10/2015   Procedure: TRANSESOPHAGEAL ECHOCARDIOGRAM (TEE);  Surgeon: Burnell Blanks, MD;  Location: [place];  Service: Open Heart Surgery;  Laterality: N/A;  . TOTAL KNEE ARTHROPLASTY     right  . TRANSCATHETER AORTIC VALVE REPLACEMENT, TRANSFEMORAL Left 06/10/2015   Procedure: TRANSCATHETER AORTIC VALVE REPLACEMENT, TRANSFEMORAL;  Surgeon:  Burnell Blanks, MD;  Location: [place];  Service: Open Heart Surgery;  Laterality: Left;    Family History  Problem Relation Age of Onset  . Arthritis Sister     "crippling"  . Arthritis Sister     Social History   Social History  . Marital status: Married    Spouse name: N/A  . Number of children: N/A  . Years of education: N/A   Occupational History  . retired Administrator    Social History Main Topics  . Smoking status: Former Smoker    Years: 30.00    Types: Pipe    Quit date: 09/20/2006  . Smokeless tobacco: Never Used  . Alcohol use No  . Drug use: No  . Sexual  activity: No     Comment: lives with wife, retired, no dietary restrictions   Other Topics Concern  . Not on file   Social History Narrative  . No narrative on file    Outpatient Medications Prior to Visit  Medication Sig Dispense Refill  . acetaminophen (TYLENOL) 500 MG tablet Take 500 mg by mouth 2 (two) times daily.     [name] allopurinol (ZYLOPRIM) 300 MG tablet TAKE 1 TABLET (300 MG TOTAL) BY MOUTH DAILY. 30 tablet 6  . aspirin 81 MG EC tablet TAKE 1 TABLET (81 MG TOTAL) BY MOUTH DAILY. 30 tablet 11  . atorvastatin (LIPITOR) 40 MG tablet Take 40 mg by mouth daily.    . cholecalciferol (VITAMIN D) 1000 UNITS tablet Take 2,000 Units by mouth daily.     . clopidogrel (PLAVIX) 75 MG tablet Take 1 tablet (75 mg total) by mouth daily with breakfast. 90 tablet 3  . Coenzyme Q10 (COQ10) 200 MG CAPS Take 200 mg by mouth daily.    . furosemide (LASIX) 20 MG tablet Take 1 tablet (20 mg total) by mouth 3 (three) times daily. 270 tablet 3  . gabapentin (NEURONTIN) 300 MG capsule TAKE 1 CAPSULE (300 MG TOTAL) BY MOUTH 2 (TWO) TIMES DAILY. 180 capsule 1  . hydrocortisone (ANUSOL-HC) 25 MG suppository Place 1 suppository (25 mg total) rectally 2 (two) times daily. 12 suppository 0  . lisinopril (PRINIVIL,ZESTRIL) 10 MG tablet Take 10 mg by mouth daily.    [name] lisinopril (PRINIVIL,ZESTRIL) 10 MG tablet TAKE 1 TABLET BY MOUTH EVERY DAY 90 tablet 0  . loratadine (CLARITIN) 10 MG tablet Take 1 tablet (10 mg total) by mouth daily. 30 tablet 0  . Menthol-Methyl Salicylate (MUSCLE RUB) 10-15 % CREA Apply 1 application topically as directed.     . Multiple Vitamin (MULTIVITAMIN WITH MINERALS) TABS Take 1 tablet by mouth daily.    . polyethylene glycol (MIRALAX / GLYCOLAX) packet Take 17 g by mouth daily.    . sennosides-docusate sodium (SENOKOT-S) 8.6-50 MG tablet Take 1 tablet by mouth 2 (two) times daily.    . traMADol (ULTRAM) 50 MG tablet Take by mouth every 6 (six) hours as needed (pain).     No  facility-administered medications prior to visit.     Allergies  Allergen Reactions  . Coumadin [Warfarin Sodium] Other (See Comments)    States he can't be on this-bleeds out    Review of Systems  Constitutional: Positive for malaise/fatigue. Negative for fever.  HENT: Negative for congestion.   Eyes: Negative for blurred vision.  Respiratory: Negative for shortness of breath.   Cardiovascular: Negative for chest pain, palpitations and leg swelling.  Gastrointestinal: Negative for abdominal pain, blood in stool and nausea.  Genitourinary: Negative for dysuria and frequency.  Musculoskeletal: Negative for falls.  Skin: Negative for rash.  Neurological: Negative for dizziness, loss of consciousness and headaches.  Endo/Heme/Allergies: Negative for environmental allergies.  Psychiatric/Behavioral: Negative for depression. The patient is not nervous/anxious.        Objective:    Physical Exam  Constitutional: He is oriented to person, place, and time. He appears well-developed and well-nourished. No distress.  HENT:  Head: Normocephalic and atraumatic.  Nose: Nose normal.  Eyes: Right eye exhibits no discharge. Left eye exhibits no discharge.  Neck: Normal range of motion. Neck supple.  Cardiovascular: Normal rate and regular rhythm.   Pulmonary/Chest: Effort normal and breath sounds normal.  Abdominal: Soft. Bowel sounds are normal. There is no tenderness.  Musculoskeletal: He exhibits edema.  Neurological: He is alert and oriented to person, place, and time.  Skin: Skin is warm and dry.  Psychiatric: He has a normal mood and affect.  Nursing note and vitals reviewed.   BP 137/71 (BP Location: Right Arm, Patient Position: Sitting, Cuff Size: Large)   Pulse 66   Temp 98.4 F (36.9 C) (Oral)   Wt (!) 380 lb (172.4 kg)   SpO2 97%   BMI 48.79 kg/m  Wt Readings from Last 3 Encounters:  07/29/16 (!) 380 lb (172.4 kg)  06/29/16 (!) 380 lb (172.4 kg)  04/23/16 (!) 385 lb  (174.6 kg)     Lab Results  Component Value Date   WBC 8.2 07/29/2016   HGB 11.9 (L) 07/29/2016   HCT 36.3 (L) 07/29/2016   PLT 152.0 07/29/2016   GLUCOSE 93 07/29/2016   CHOL 143 07/29/2016   TRIG 197.0 (H) 07/29/2016   HDL 28.50 (L) 07/29/2016   LDLCALC 75 07/29/2016   ALT 20 07/29/2016   AST 19 07/29/2016   NA 138 07/29/2016   K 5.4 (H) 07/29/2016   CL 101 07/29/2016   CREATININE 1.23 07/29/2016   BUN 34 (H) 07/29/2016   CO2 31 07/29/2016   TSH 1.37 07/29/2016   INR 1.32 06/10/2015   HGBA1C 5.7 (H) 06/06/2015    Lab Results  Component Value Date   TSH 1.37 07/29/2016   Lab Results  Component Value Date   WBC 8.2 07/29/2016   HGB 11.9 (L) 07/29/2016   HCT 36.3 (L) 07/29/2016   MCV 93.7 07/29/2016   PLT 152.0 07/29/2016   Lab Results  Component Value Date   NA 138 07/29/2016   K 5.4 (H) 07/29/2016   CO2 31 07/29/2016   GLUCOSE 93 07/29/2016   BUN 34 (H) 07/29/2016   CREATININE 1.23 07/29/2016   BILITOT 0.4 07/29/2016   ALKPHOS 80 07/29/2016   AST 19 07/29/2016   ALT 20 07/29/2016   PROT 6.9 07/29/2016   ALBUMIN 3.9 07/29/2016   CALCIUM 9.6 07/29/2016   ANIONGAP 3 (L) 06/13/2015   GFR 61.14 07/29/2016   Lab Results  Component Value Date   CHOL 143 07/29/2016   Lab Results  Component Value Date   HDL 28.50 (L) 07/29/2016   Lab Results  Component Value Date   LDLCALC 75 07/29/2016   Lab Results  Component Value Date   TRIG 197.0 (H) 07/29/2016   Lab Results  Component Value Date   CHOLHDL 5 07/29/2016   Lab Results  Component Value Date   HGBA1C 5.7 (H) 06/06/2015       Assessment & Plan:   Problem List Items Addressed This Visit    HYPERCHOLESTEROLEMIA    Tolerating statin,  encouraged heart healthy diet, avoid trans fats, minimize simple carbs and saturated fats. Increase exercise as tolerated      Relevant Orders   Lipid panel (Completed)   Gout    Check uric acid level, no recent flares      Relevant Orders    Comprehensive metabolic panel (Completed)   OBESITY, MORBID    Encouraged DASH diet, decrease po intake and increase exercise as tolerated. Needs 7-8 hours of sleep nightly. Avoid trans fats, eat small, frequent meals every 4-5 hours with lean proteins, complex carbs and healthy fats. Minimize simple carbs      Anemia    Check CBC, mild      Relevant Orders   CBC (Completed)   Obstructive sleep apnea    Has recently been placed on a new Bipap machine. Is doing well.       Severe aortic valve stenosis    Follows with cardiology      Relevant Orders   Comprehensive metabolic panel (Completed)   TSH (Completed)      I am having Mr. Tuckerman maintain his cholecalciferol, sennosides-docusate sodium, multivitamin with minerals, loratadine, acetaminophen, CoQ10, polyethylene glycol, MUSCLE RUB, hydrocortisone, atorvastatin, lisinopril, gabapentin, clopidogrel, furosemide, allopurinol, aspirin, traMADol, and lisinopril.  No orders of the defined types were placed in this encounter.    Penni Homans, MD

## 2016-08-12 ENCOUNTER — Encounter: Payer: Self-pay | Admitting: Adult Health

## 2016-08-16 ENCOUNTER — Encounter: Payer: Self-pay | Admitting: Adult Health

## 2016-08-16 ENCOUNTER — Ambulatory Visit (INDEPENDENT_AMBULATORY_CARE_PROVIDER_SITE_OTHER): Payer: PPO | Admitting: Adult Health

## 2016-08-16 VITALS — BP 122/72 | HR 60 | Temp 97.9°F | Ht 74.0 in

## 2016-08-16 DIAGNOSIS — R0602 Shortness of breath: Secondary | ICD-10-CM | POA: Diagnosis not present

## 2016-08-16 DIAGNOSIS — G4733 Obstructive sleep apnea (adult) (pediatric): Secondary | ICD-10-CM

## 2016-08-16 NOTE — Assessment & Plan Note (Signed)
Great compliance with BIPAP however download control does not show optimal control but breakdown is not able to recognize the exact event and he is feeling much better and is essentially asymptomatic -so will not make changes to BIPAP  Case reviewed with Dr. Annamaria Boots   Will start O2 with BIPAP and have close follow up with Dr. Annamaria Boots  In 3 months .   Plan  Patient Instructions  Continue on BIPAP At bedtime   Keep up great job.  Work on weight loss.  Add oxygen 2l/m with BIPAP At bedtime  .  Follow up with Dr. Annamaria Boots  In 3 months and As needed

## 2016-08-16 NOTE — Patient Instructions (Addendum)
Continue on BIPAP At bedtime   Keep up great job.  Work on weight loss.  Add oxygen 2l/m with BIPAP At bedtime  .  Follow up with Dr. Annamaria Boots  In 3 months and As needed

## 2016-08-16 NOTE — Progress Notes (Signed)
Subjective:    Patient ID: [name], male    DOB: 1941-05-21, 75 y.o.   MRN: [id number]  HPI 75 year old male former smoker with severe obstructive sleep apnea, complicated by obesity, aortic stenosis and severe osteoarthritis   08/16/2016 Follow up : OSA  Patient returns for a 6 month follow-up for sleep apnea.  Last ov , pt was seen w/ excellent compliance for his CPAP but control was not as good with AHI at 50.   He was set up for CPAP titration that showed that he needed over 20cmH20 pressure . He was then sent for BIPAP titration study that showed optimal control on 21/17 cm H2O .w/ AHI ~3 . There was desats on BIPAP he was set up for a ONO on BIPAP but declined.  He returns today feeling much better. Says he loves his new machine, feels more rested.  Download shows excellent compliance with avg usage at 8hr . AHI 52. He is on IPAP/EPAP 21/17 w/ PS 4cmH2O. Breakdown shows no central /obsructive , unknowns were 51.9.  He denies chest pain, dyspnea , edema or fever.  He is using nasal mask but Wants a full face to interchange them .    Past Medical History:  Diagnosis Date  . Anemia   . Aortic stenosis   . Aortic stenosis, severe    S/p Edwards Sapien 3 Transcatheter Heart Valve (size 26 mm, model # [id number], serial # [id number])  . Arthritis   . Bursitis   . Cataract    left immature  . Cellulitis 10/12/2015  . Complication of anesthesia    Halucinations  . Constipation    takes Miralax daily as well as Senokot daily  . DDD (degenerative disc disease)   . Gout    takes Allopurinol daily  . History of blood clots 1962   knee  . History of blood transfusion    no abnormal reaction noted  . History of shingles   . Hyperlipidemia    takes Atorvastatin daily  . Hypertension    takes Lisinopril daily  . OSA (obstructive sleep apnea)   . Osteoarthritis   . Peripheral edema    takes Furosemide daily  . Peripheral neuropathy (HCC)    takes Gabapentin daily  .  Peroneal palsy    significant right foot drop  . Pneumonia 25+yrs ago   hx of  . Urinary frequency   . Urinary urgency   . Valvular heart disease    Current Outpatient Prescriptions on File Prior to Visit  Medication Sig Dispense Refill  . acetaminophen (TYLENOL) 500 MG tablet Take 500 mg by mouth 2 (two) times daily.     [name] allopurinol (ZYLOPRIM) 300 MG tablet TAKE 1 TABLET (300 MG TOTAL) BY MOUTH DAILY. 30 tablet 6  . aspirin 81 MG EC tablet TAKE 1 TABLET (81 MG TOTAL) BY MOUTH DAILY. 30 tablet 11  . atorvastatin (LIPITOR) 40 MG tablet Take 40 mg by mouth daily.    . cholecalciferol (VITAMIN D) 1000 UNITS tablet Take 2,000 Units by mouth daily.     . clopidogrel (PLAVIX) 75 MG tablet Take 1 tablet (75 mg total) by mouth daily with breakfast. 90 tablet 3  . Coenzyme Q10 (COQ10) 200 MG CAPS Take 200 mg by mouth daily.    . furosemide (LASIX) 20 MG tablet Take 1 tablet (20 mg total) by mouth 3 (three) times daily. 270 tablet 3  . gabapentin (NEURONTIN) 300 MG capsule TAKE 1 CAPSULE (300 MG TOTAL)  BY MOUTH 2 (TWO) TIMES DAILY. 180 capsule 1  . hydrocortisone (ANUSOL-HC) 25 MG suppository Place 1 suppository (25 mg total) rectally 2 (two) times daily. 12 suppository 0  . lisinopril (PRINIVIL,ZESTRIL) 10 MG tablet TAKE 1 TABLET BY MOUTH EVERY DAY 90 tablet 0  . loratadine (CLARITIN) 10 MG tablet Take 1 tablet (10 mg total) by mouth daily. 30 tablet 0  . Menthol-Methyl Salicylate (MUSCLE RUB) 10-15 % CREA Apply 1 application topically as directed.     . Multiple Vitamin (MULTIVITAMIN WITH MINERALS) TABS Take 1 tablet by mouth daily.    . polyethylene glycol (MIRALAX / GLYCOLAX) packet Take 17 g by mouth daily.    . sennosides-docusate sodium (SENOKOT-S) 8.6-50 MG tablet Take 1 tablet by mouth 2 (two) times daily.    . traMADol (ULTRAM) 50 MG tablet Take by mouth every 6 (six) hours as needed (pain).     No current facility-administered medications on file prior to visit.      Review of  Systems Constitutional:   No  weight loss, night sweats,  Fevers, chills, + fatigue, or  lassitude.  HEENT:   No headaches,  Difficulty swallowing,  Tooth/dental problems, or  Sore throat,                No sneezing, itching, ear ache, nasal congestion, post nasal drip,   CV:  No chest pain,  Orthopnea, PND, swelling in lower extremities, anasarca, dizziness, palpitations, syncope.   GI  No heartburn, indigestion, abdominal pain, nausea, vomiting, diarrhea, change in bowel habits, loss of appetite, bloody stools.   Resp: No shortness of breath with exertion or at rest.  No excess mucus, no productive cough,  No non-productive cough,  No coughing up of blood.  No change in color of mucus.  No wheezing.  No chest wall deformity  Skin: no rash or lesions.  GU: no dysuria, change in color of urine, no urgency or frequency.  No flank pain, no hematuria   MS:  +knee pain   Psych:  No change in mood or affect. No depression or anxiety.  No memory loss.         Objective:   Physical Exam  Vitals:   08/16/16 1110  BP: 122/72  Pulse: 60  Temp: 97.9 F (36.6 C)  TempSrc: Oral  SpO2: 97%  Height: 6\' 2"  (1.88 m)  There is no height or weight on file to calculate BMI.   GEN: A/Ox3; pleasant , NAD, morbidly obese   HEENT:  [place]/AT,  EACs-clear, TMs-wnl, NOSE-clear, THROAT-clear, no lesions, no postnasal drip or exudate noted. Class III MP airway  NECK:  Supple w/ fair ROM; no JVD; normal carotid impulses w/o bruits; no thyromegaly or nodules palpated; no lymphadenopathy.    RESP  Clear  P & A; w/o, wheezes/ rales/ or rhonchi. no accessory muscle use, no dullness to percussion  CARD:  RRR, no m/r/g  , 1+,  peripheral edema, stasis dermatitis changes, pulses intact, no cyanosis or clubbing.  GI:   Soft & nt; nml bowel sounds; no organomegaly or masses detected.   Musco: Warm bil, no deformities or joint swelling noted.   Neuro: alert, no focal deficits noted.    Skin: Warm, no  lesions or rashes  Trayven Lumadue NP-C  [facility]  08/16/2016

## 2016-08-17 ENCOUNTER — Other Ambulatory Visit: Payer: Self-pay | Admitting: Family Medicine

## 2016-08-19 ENCOUNTER — Encounter: Payer: Self-pay | Admitting: Internal Medicine

## 2016-08-21 DIAGNOSIS — G4733 Obstructive sleep apnea (adult) (pediatric): Secondary | ICD-10-CM | POA: Diagnosis not present

## 2016-08-31 ENCOUNTER — Ambulatory Visit (INDEPENDENT_AMBULATORY_CARE_PROVIDER_SITE_OTHER): Payer: PPO | Admitting: Internal Medicine

## 2016-08-31 ENCOUNTER — Encounter: Payer: Self-pay | Admitting: Internal Medicine

## 2016-08-31 VITALS — BP 106/54 | HR 62 | Ht 74.0 in | Wt 385.0 lb

## 2016-08-31 DIAGNOSIS — E662 Morbid (severe) obesity with alveolar hypoventilation: Secondary | ICD-10-CM | POA: Diagnosis not present

## 2016-08-31 DIAGNOSIS — G4733 Obstructive sleep apnea (adult) (pediatric): Secondary | ICD-10-CM

## 2016-08-31 NOTE — Progress Notes (Signed)
HPI M former smoker followed for OSA complicated by obesity, aortic stenosis, osteoarthritis  ------------------------   01/05/2016-75 year old male former smoker followed for OSA complicated by morbid obesity, aortic stenosis/ AVR, osteoarthritis CPAP 18/[facility] Follows for: OSA. Pt states that his CPAP machine is broken and he has a temporary machine from [facility]. Pt did bring the SD card from the current machine but there is no data available. Pt states that he does wear CPAP nightly for about 8 hours nightly. Pt does feel the machine works well and he does feel rested.  CXR 06/11/2015-IMPRESSION: 1. Right IJ sheath and Swan-Ganz catheter stable position. 2. Prior aortic valve replacement. Stable cardiomegaly. 3. Low lung volumes with bibasilar atelectasis.  08/31/2016-75 year old male former smoker followed for OSA, complicated by morbid obesity, aortic stenosis/aVR, osteoarthritis BiPAP titration study-21/17, PS 4 cwp O2 2L sleep, with residual AHI 52 "unknown" events, either obstructive or central and presumably mostly RERAs.  FOLLOWS FOR:  Wears BiPAP nightly. Denies any issues with mask/pressure. Pt reports having excessive sleepiness since being started on O2 with BiPAP. DME: [facility] Download continue to show elevated residual AHI 53.1/hour despite 100% compliance. He notices feeling a little more sleepy in the daytime, falling asleep in his TV chair. He associated that with starting oxygen through his machine. Denies morning headache. Wife describes a mild steady snoring noise through his mask without witnessed apneas He asks about oral appliances and we discussed the possibility as a supplement, not replacement for his BiPAP. Uses nasal mask.  ROS- see HPI Constitutional:   No-   weight loss, night sweats, fevers, chills, +fatigue, lassitude. HEENT:   No-  headaches, difficulty swallowing, tooth/dental problems, sore throat,       No-  sneezing, itching, ear ache, nasal congestion, post  nasal drip,  CV:  No-   chest pain, orthopnea, PND, swelling in lower extremities, anasarca,  dizziness, palpitations Resp: +shortness of breath with exertion or at rest.              No-   productive cough,  No non-productive cough,  No- coughing up of blood.              No-   change in color of mucus.  No- wheezing.   Skin: No-   rash or lesions. GI:  No-   heartburn, indigestion, abdominal pain, nausea, vomiting, GU:  MS:  No-   joint pain or swelling.   Neuro-     nothing unusual Psych:  No- change in mood or affect. No depression or anxiety.  No memory loss.  OBJ General- Alert, Oriented, Affect-appropriate, Distress- none acute. +Morbidly obese, +power wheelchair  Skin- rash-none, lesions- none, excoriation- none Lymphadenopathy- none Head- atraumatic            Eyes- Gross vision intact, PERRLA, conjunctivae clear secretions            Ears- Hearing, canals-normal            Nose- Clear, no-Septal dev, mucus, polyps, erosion, perforation             Throat- Mallampati III-IV , mucosa clear , drainage- none, tonsils- atrophic Neck- flexible , trachea midline, no stridor , thyroid nl, carotid no bruit Chest - symmetrical excursion , unlabored           Heart/CV- RRR , murmur-none, no gallop,  no rub, nl s1 s2                           -  JVD- none , edema- none, stasis changes- none, varices- none           Lung- clear to P&A, wheeze- none, cough- none , dullness-none, rub- none           Chest wall-  Abd-  Br/ Gen/ Rectal- Not done, not indicated Extrem- cyanosis- none, clubbing, none, atrophy- none, strength- nl Neuro- grossly intact to observation, alert and pleasant

## 2016-08-31 NOTE — Assessment & Plan Note (Signed)
We are not getting good enough AHI control. He is comfortable with his pressures and compliant based on download. We discussed possibilities including an ASV device or combination of oral appliance and BIPAP. First frequent try moving his inspiratory pressure up to 23 to see what that impact is.

## 2016-08-31 NOTE — Patient Instructions (Signed)
Order- [facility]   Please increase BIPAP to 23/17  And reduce O2 to 1L for sleep   Dx OSA, obesity hypoventilation  Please call as needed

## 2016-08-31 NOTE — Assessment & Plan Note (Signed)
385 pounds at this visit is morbidly obese for his frame. Support And bariatric counseling have been made available to him but he has not been prepared to make the change.

## 2016-09-02 ENCOUNTER — Encounter: Payer: Self-pay | Admitting: Internal Medicine

## 2016-09-06 ENCOUNTER — Other Ambulatory Visit: Payer: Self-pay | Admitting: Family Medicine

## 2016-09-06 NOTE — Telephone Encounter (Signed)
Last seen 07/29/16 Last filled 06/11/16  Never filled by you  BMB:OMQT by mouth every 6 (six) hours as needed (pain).  Please advise  PC

## 2016-09-07 NOTE — Telephone Encounter (Signed)
Faxed hardcopy for Tramadol to [facility] [place]

## 2016-09-09 ENCOUNTER — Other Ambulatory Visit: Payer: Self-pay | Admitting: Cardiovascular Disease

## 2016-09-15 DIAGNOSIS — G4733 Obstructive sleep apnea (adult) (pediatric): Secondary | ICD-10-CM | POA: Diagnosis not present

## 2016-09-15 DIAGNOSIS — R0602 Shortness of breath: Secondary | ICD-10-CM | POA: Diagnosis not present

## 2016-09-21 DIAGNOSIS — G4733 Obstructive sleep apnea (adult) (pediatric): Secondary | ICD-10-CM | POA: Diagnosis not present

## 2016-09-29 ENCOUNTER — Ambulatory Visit (INDEPENDENT_AMBULATORY_CARE_PROVIDER_SITE_OTHER): Payer: PPO | Admitting: Physician Assistant

## 2016-09-29 ENCOUNTER — Encounter: Payer: Self-pay | Admitting: Physician Assistant

## 2016-09-29 VITALS — BP 122/70 | HR 72 | Temp 98.4°F | Resp 16

## 2016-09-29 DIAGNOSIS — J069 Acute upper respiratory infection, unspecified: Secondary | ICD-10-CM | POA: Diagnosis not present

## 2016-09-29 DIAGNOSIS — B9789 Other viral agents as the cause of diseases classified elsewhere: Secondary | ICD-10-CM

## 2016-09-29 DIAGNOSIS — R0789 Other chest pain: Secondary | ICD-10-CM

## 2016-09-29 MED ORDER — ALBUTEROL SULFATE (2.5 MG/3ML) 0.083% IN NEBU
2.5000 mg | INHALATION_SOLUTION | Freq: Once | RESPIRATORY_TRACT | Status: AC
  Administered 2016-09-29: 2.5 mg via RESPIRATORY_TRACT

## 2016-09-29 MED ORDER — ALBUTEROL SULFATE HFA 108 (90 BASE) MCG/ACT IN AERS: 2.0000 | INHALATION_SPRAY | Freq: Four times a day (QID) | RESPIRATORY_TRACT | 0 refills | Status: DC | PRN

## 2016-09-29 MED ORDER — PREDNISONE 20 MG PO TABS: 40.0000 mg | ORAL_TABLET | Freq: Every day | ORAL | 0 refills | Status: DC

## 2016-09-29 MED ORDER — ALBUTEROL SULFATE (2.5 MG/3ML) 0.083% IN NEBU: 2.5000 mg | INHALATION_SOLUTION | Freq: Once | RESPIRATORY_TRACT | Status: DC

## 2016-09-29 NOTE — Progress Notes (Signed)
Patient presents to clinic today c/o 3 days of nasal congestion, chest tightness with PND. Denies fever, chills, chest pain. Endorses some occasional ear pressure without pain. Denies recent travel. Denies history of asthma or COPD. Notes mild chest tightness. Has + history of OSA -- on BiPAP nightly. . Followed by Pulmonology.    Past Medical History:  Diagnosis Date  . Anemia   . Aortic stenosis   . Aortic stenosis, severe    S/p Edwards Sapien 3 Transcatheter Heart Valve (size 26 mm, model # [id number], serial # [id number])  . Arthritis   . Bursitis   . Cataract    left immature  . Cellulitis 10/12/2015  . Complication of anesthesia    Halucinations  . Constipation    takes Miralax daily as well as Senokot daily  . DDD (degenerative disc disease)   . Gout    takes Allopurinol daily  . History of blood clots 1962   knee  . History of blood transfusion    no abnormal reaction noted  . History of shingles   . Hyperlipidemia    takes Atorvastatin daily  . Hypertension    takes Lisinopril daily  . OSA (obstructive sleep apnea)   . Osteoarthritis   . Peripheral edema    takes Furosemide daily  . Peripheral neuropathy (HCC)    takes Gabapentin daily  . Peroneal palsy    significant right foot drop  . Pneumonia 25+yrs ago   hx of  . Urinary frequency   . Urinary urgency   . Valvular heart disease     Current Outpatient Prescriptions on File Prior to Visit  Medication Sig Dispense Refill  . acetaminophen (TYLENOL) 500 MG tablet Take 500 mg by mouth 2 (two) times daily.     [name] allopurinol (ZYLOPRIM) 300 MG tablet TAKE 1 TABLET (300 MG TOTAL) BY MOUTH DAILY. 30 tablet 6  . aspirin 81 MG EC tablet TAKE 1 TABLET (81 MG TOTAL) BY MOUTH DAILY. 30 tablet 11  . atorvastatin (LIPITOR) 40 MG tablet Take 40 mg by mouth daily.    . cholecalciferol (VITAMIN D) 1000 UNITS tablet Take 2,000 Units by mouth daily.     . clopidogrel (PLAVIX) 75 MG tablet Take 1 tablet (75 mg total) by mouth  daily with breakfast. 90 tablet 3  . Coenzyme Q10 (COQ10) 200 MG CAPS Take 200 mg by mouth daily.    . furosemide (LASIX) 20 MG tablet Take 1 tablet (20 mg total) by mouth 3 (three) times daily. (Patient taking differently: Take 20 mg by mouth 2 (two) times daily. ) 270 tablet 3  . gabapentin (NEURONTIN) 300 MG capsule TAKE 1 CAPSULE (300 MG TOTAL) BY MOUTH 2 (TWO) TIMES DAILY. 180 capsule 1  . hydrocortisone (ANUSOL-HC) 25 MG suppository Place 1 suppository (25 mg total) rectally 2 (two) times daily. 12 suppository 0  . lisinopril (PRINIVIL,ZESTRIL) 10 MG tablet TAKE 1 TABLET BY MOUTH EVERY DAY 90 tablet 0  . loratadine (CLARITIN) 10 MG tablet Take 1 tablet (10 mg total) by mouth daily. 30 tablet 0  . Menthol-Methyl Salicylate (MUSCLE RUB) 10-15 % CREA Apply 1 application topically as directed.     . Multiple Vitamin (MULTIVITAMIN WITH MINERALS) TABS Take 1 tablet by mouth daily.    . polyethylene glycol (MIRALAX / GLYCOLAX) packet Take 17 g by mouth daily.    . sennosides-docusate sodium (SENOKOT-S) 8.6-50 MG tablet Take 1 tablet by mouth 2 (two) times daily.    . traMADol Veatrice Bourbon)  50 MG tablet TAKE 1 TABLET BY MOUTH EVERY 6 HOURS AS NEEDED 90 tablet 0   No current facility-administered medications on file prior to visit.     Allergies  Allergen Reactions  . Coumadin [Warfarin Sodium] Other (See Comments)    States he can't be on this-bleeds out    Family History  Problem Relation Age of Onset  . Arthritis Sister     "crippling"  . Arthritis Sister     Social History   Social History  . Marital status: Married    Spouse name: N/A  . Number of children: N/A  . Years of education: N/A   Occupational History  . retired Administrator    Social History Main Topics  . Smoking status: Former Smoker    Years: 30.00    Types: Pipe    Quit date: 09/20/2006  . Smokeless tobacco: Never Used  . Alcohol use No  . Drug use: No  . Sexual activity: No     Comment: lives with wife,  retired, no dietary restrictions   Other Topics Concern  . None   Social History Narrative  . None   Review of Systems - See HPI.  All other ROS are negative.  BP 122/70   Pulse 72   Temp 98.4 F (36.9 C) (Oral)   Resp 16   SpO2 92%   Physical Exam  Constitutional: He is oriented to person, place, and time and well-developed, well-nourished, and in no distress.  HENT:  Head: Normocephalic and atraumatic.  Right Ear: External ear normal.  Left Ear: External ear normal.  Nose: Nose normal.  Mouth/Throat: Oropharynx is clear and moist. No oropharyngeal exudate.  TM within normal limits bilaterally.   Eyes: Conjunctivae are normal.  Neck: Neck supple.  Cardiovascular: Normal rate, regular rhythm, normal heart sounds and intact distal pulses.   Pulmonary/Chest: Effort normal. No respiratory distress. He has wheezes. He has no rales. He exhibits no tenderness.  Lymphadenopathy:    He has no cervical adenopathy.  Neurological: He is alert and oriented to person, place, and time.  Skin: Skin is warm and dry. No rash noted.  Psychiatric: Affect normal.  Vitals reviewed.   Recent Results (from the past 2160 hour(s))  CBC     Status: Abnormal   Collection Time: 07/29/16 12:09 PM  Result Value Ref Range   WBC 8.2 4.0 - 10.5 K/uL   RBC 3.87 (L) 4.22 - 5.81 Mil/uL   Platelets 152.0 150.0 - 400.0 K/uL   Hemoglobin 11.9 (L) 13.0 - 17.0 g/dL   HCT 36.3 (L) 39.0 - 52.0 %   MCV 93.7 78.0 - 100.0 fl   MCHC 32.8 30.0 - 36.0 g/dL   RDW 17.3 (H) 11.5 - 15.5 %  Comprehensive metabolic panel     Status: Abnormal   Collection Time: 07/29/16 12:09 PM  Result Value Ref Range   Sodium 138 135 - 145 mEq/L   Potassium 5.4 (H) 3.5 - 5.1 mEq/L   Chloride 101 96 - 112 mEq/L   CO2 31 19 - 32 mEq/L   Glucose, Bld 93 70 - 99 mg/dL   BUN 34 (H) 6 - 23 mg/dL   Creatinine, Ser 1.23 0.40 - 1.50 mg/dL   Total Bilirubin 0.4 0.2 - 1.2 mg/dL   Alkaline Phosphatase 80 39 - 117 U/L   AST 19 0 - 37 U/L    ALT 20 0 - 53 U/L   Total Protein 6.9 6.0 - 8.3 g/dL  Albumin 3.9 3.5 - 5.2 g/dL   Calcium 9.6 8.4 - 10.5 mg/dL   GFR 61.14 >60.00 mL/min  Lipid panel     Status: Abnormal   Collection Time: 07/29/16 12:09 PM  Result Value Ref Range   Cholesterol 143 0 - 200 mg/dL    Comment: ATP III Classification       Desirable:  < 200 mg/dL               Borderline High:  200 - 239 mg/dL          High:  > = 240 mg/dL   Triglycerides 197.0 (H) 0.0 - 149.0 mg/dL    Comment: Normal:  <150 mg/dLBorderline High:  150 - 199 mg/dL   HDL 28.50 (L) >39.00 mg/dL   VLDL 39.4 0.0 - 40.0 mg/dL   LDL Cholesterol 75 0 - 99 mg/dL   Total CHOL/HDL Ratio 5     Comment:                Men          Women1/2 Average Risk     3.4          3.3Average Risk          5.0          4.42X Average Risk          9.6          7.13X Average Risk          15.0          11.0                       NonHDL 114.39     Comment: NOTE:  Non-HDL goal should be 30 mg/dL higher than patient's LDL goal (i.e. LDL goal of < 70 mg/dL, would have non-HDL goal of < 100 mg/dL)  TSH     Status: None   Collection Time: 07/29/16 12:09 PM  Result Value Ref Range   TSH 1.37 0.35 - 4.50 uIU/mL   Assessment/Plan: 1. Viral URI Supportive measures and OTC medications reviewed with patient. For tightness/wheezing, prednisone burst has been started. Due to wheezing -- FU if not improving with 48 hours.    2. Chest tightness Albuterol neb given with improvement. Rx Albuterol MDI. Rx Prednisone burst. Supportive measures and OTC medications reviewed. FU if no improvement within 48 hours.  - albuterol (PROVENTIL HFA;VENTOLIN HFA) 108 (90 Base) MCG/ACT inhaler; Inhale 2 puffs into the lungs every 6 (six) hours as needed for wheezing or shortness of breath.  Dispense: 1 Inhaler; Refill: 0 - predniSONE (DELTASONE) 20 MG tablet; Take 2 tablets (40 mg total) by mouth daily with breakfast.  Dispense: 6 tablet; Refill: 0 - albuterol (PROVENTIL) (2.5 MG/3ML) 0.083%  nebulizer solution 2.5 mg; Take 3 mLs (2.5 mg total) by nebulization once.   Leeanne Rio, PA-C

## 2016-09-29 NOTE — Patient Instructions (Signed)
Please stay well hydrated and get plenty of rest. Continue BiPAP as directed. Make sure to get some saline nasal spray to flush out nasal passages and help with post-nasal drip. Use the albuterol inhaler as directed for chest tightness/wheeze. Take prednisone as directed.  Call or return to clinic if symptoms are not improving within 48 hours.

## 2016-09-29 NOTE — Progress Notes (Signed)
Pre visit review using our clinic review tool, if applicable. No additional management support is needed unless otherwise documented below in the visit note. 

## 2016-09-30 ENCOUNTER — Other Ambulatory Visit: Payer: Self-pay | Admitting: Cardiovascular Disease

## 2016-09-30 ENCOUNTER — Ambulatory Visit: Payer: PPO | Admitting: Medical

## 2016-10-16 DIAGNOSIS — G4733 Obstructive sleep apnea (adult) (pediatric): Secondary | ICD-10-CM | POA: Diagnosis not present

## 2016-10-16 DIAGNOSIS — R0602 Shortness of breath: Secondary | ICD-10-CM | POA: Diagnosis not present

## 2016-10-20 ENCOUNTER — Other Ambulatory Visit: Payer: Self-pay | Admitting: Family Medicine

## 2016-10-22 DIAGNOSIS — G4733 Obstructive sleep apnea (adult) (pediatric): Secondary | ICD-10-CM | POA: Diagnosis not present

## 2016-11-01 NOTE — Progress Notes (Signed)
Patient ID: [name], male   DOB: 1941/01/05, 76 y.o.   MRN: [id number]    Chief Complaint  Patient presents with  . Aortic Valve Disorder     History of Present Illness: 76 y.o.  male with history of morbid obesity, mild HTN, HLD, gout and severe aortic stenosis who is here today for TAVR  follow up.[name] He underwent TAVR on 06/10/15 with 26 mm Sapien 3 . Left transfemoral approach with cut down by Dr Cyndia Bent He did well following the procedure. There were no complications. He tells me today that he is feeling better. His breathing has improved. He has no pain.He is still limited by chronic joint issues. Started on Keflex and Diflucon by Dr Randel Pigg for LLE cellulitis More energy and less dyspnea since valve done   Primary Care Physician: Penni Homans  Seen by [facility] 06/11/16 for post TAVR did not comment on LVOT gradient Has had diuretic increased for edema Issues with CPAP sees Dr Annamaria Boots may need dental appliance in addition to help with obstruction   Past Medical History:  Diagnosis Date  . Anemia   . Aortic stenosis   . Aortic stenosis, severe    S/p Edwards Sapien 3 Transcatheter Heart Valve (size 26 mm, model # [id number], serial # [id number])  . Arthritis   . Bursitis   . Cataract    left immature  . Cellulitis 10/12/2015  . Complication of anesthesia    Halucinations  . Constipation    takes Miralax daily as well as Senokot daily  . DDD (degenerative disc disease)   . Gout    takes Allopurinol daily  . History of blood clots 1962   knee  . History of blood transfusion    no abnormal reaction noted  . History of shingles   . Hyperlipidemia    takes Atorvastatin daily  . Hypertension    takes Lisinopril daily  . OSA (obstructive sleep apnea)   . Osteoarthritis   . Peripheral edema    takes Furosemide daily  . Peripheral neuropathy (HCC)    takes Gabapentin daily  . Peroneal palsy    significant right foot drop  . Pneumonia 25+yrs ago   hx of  . Urinary frequency   .  Urinary urgency   . Valvular heart disease     Past Surgical History:  Procedure Laterality Date  . CARDIAC CATHETERIZATION N/A 05/02/2015   Procedure: Right/Left Heart Cath and Coronary Angiography;  Surgeon: Burnell Blanks, MD;  Location: [facility];  Service: Cardiovascular;  Laterality: N/A;  . COLONOSCOPY N/A 02/07/2013   Procedure: COLONOSCOPY;  Surgeon: Juanita Craver, MD;  Location: [facility] ENDOSCOPY;  Service: Endoscopy;  Laterality: N/A;  . COLONOSCOPY N/A 02/09/2013   Procedure: COLONOSCOPY;  Surgeon: Beryle Beams, MD;  Location: [place];  Service: Endoscopy;  Laterality: N/A;  . ESOPHAGOGASTRODUODENOSCOPY N/A 02/07/2013   Procedure: ESOPHAGOGASTRODUODENOSCOPY (EGD);  Surgeon: Juanita Craver, MD;  Location: [facility] ENDOSCOPY;  Service: Endoscopy;  Laterality: N/A;  . GIVENS CAPSULE STUDY N/A 02/09/2013   Procedure: GIVENS CAPSULE STUDY;  Surgeon: Beryle Beams, MD;  Location: [place];  Service: Endoscopy;  Laterality: N/A;  . HERNIA REPAIR     umbilical hernia  . KNEE SURGERY Right    multiple knee surgeries due to complication of R TKA  . LAMINOTOMY  8676   c6-t2  . SPINE SURGERY    . TEE WITHOUT CARDIOVERSION N/A 03/07/2015   Procedure: TRANSESOPHAGEAL ECHOCARDIOGRAM (TEE);  Surgeon: Wallis Bamberg  [name], MD;  Location: [facility] ENDOSCOPY;  Service: Cardiovascular;  Laterality: N/A;  ANES TO BRING PROPOFOL PER DOCTOR  . TEE WITHOUT CARDIOVERSION N/A 06/10/2015   Procedure: TRANSESOPHAGEAL ECHOCARDIOGRAM (TEE);  Surgeon: Burnell Blanks, MD;  Location: [place];  Service: Open Heart Surgery;  Laterality: N/A;  . TOTAL KNEE ARTHROPLASTY     right  . TRANSCATHETER AORTIC VALVE REPLACEMENT, TRANSFEMORAL Left 06/10/2015   Procedure: TRANSCATHETER AORTIC VALVE REPLACEMENT, TRANSFEMORAL;  Surgeon: Burnell Blanks, MD;  Location: [place];  Service: Open Heart Surgery;  Laterality: Left;    Current Outpatient Prescriptions  Medication Sig Dispense Refill  . acetaminophen  (TYLENOL) 500 MG tablet Take 500 mg by mouth 2 (two) times daily.     [name] albuterol (PROVENTIL HFA;VENTOLIN HFA) 108 (90 Base) MCG/ACT inhaler Inhale 2 puffs into the lungs every 6 (six) hours as needed for wheezing or shortness of breath. 1 Inhaler 0  . allopurinol (ZYLOPRIM) 300 MG tablet TAKE 1 TABLET (300 MG TOTAL) BY MOUTH DAILY. 30 tablet 6  . aspirin 81 MG EC tablet TAKE 1 TABLET (81 MG TOTAL) BY MOUTH DAILY. 30 tablet 11  . atorvastatin (LIPITOR) 40 MG tablet Take 40 mg by mouth daily.    . cholecalciferol (VITAMIN D) 1000 UNITS tablet Take 2,000 Units by mouth daily.     . clopidogrel (PLAVIX) 75 MG tablet Take 1 tablet (75 mg total) by mouth daily with breakfast. 90 tablet 3  . Coenzyme Q10 (COQ10) 200 MG CAPS Take 200 mg by mouth daily.    . furosemide (LASIX) 20 MG tablet Take 1 tablet (20 mg total) by mouth 3 (three) times daily. (Patient taking differently: Take 20 mg by mouth 2 (two) times daily. ) 270 tablet 3  . furosemide (LASIX) 20 MG tablet TAKE 1 TABLET BY MOUTH TWICE A DAY 180 tablet 3  . gabapentin (NEURONTIN) 300 MG capsule TAKE 1 CAPSULE (300 MG TOTAL) BY MOUTH 2 (TWO) TIMES DAILY. 180 capsule 1  . hydrocortisone (ANUSOL-HC) 25 MG suppository Place 1 suppository (25 mg total) rectally 2 (two) times daily. 12 suppository 0  . lisinopril (PRINIVIL,ZESTRIL) 10 MG tablet TAKE 1 TABLET BY MOUTH EVERY DAY 90 tablet 0  . loratadine (CLARITIN) 10 MG tablet Take 1 tablet (10 mg total) by mouth daily. 30 tablet 0  . Menthol-Methyl Salicylate (MUSCLE RUB) 10-15 % CREA Apply 1 application topically as directed.     . Multiple Vitamin (MULTIVITAMIN WITH MINERALS) TABS Take 1 tablet by mouth daily.    . polyethylene glycol (MIRALAX / GLYCOLAX) packet Take 17 g by mouth daily.    . predniSONE (DELTASONE) 20 MG tablet Take 2 tablets (40 mg total) by mouth daily with breakfast. 6 tablet 0  . sennosides-docusate sodium (SENOKOT-S) 8.6-50 MG tablet Take 1 tablet by mouth 2 (two) times daily.      . traMADol (ULTRAM) 50 MG tablet TAKE 1 TABLET BY MOUTH EVERY 6 HOURS AS NEEDED 90 tablet 0   No current facility-administered medications for this visit.     Allergies  Allergen Reactions  . Coumadin [Warfarin Sodium] Other (See Comments)    States he can't be on this-bleeds out    Social History   Social History  . Marital status: Married    Spouse name: N/A  . Number of children: N/A  . Years of education: N/A   Occupational History  . retired Administrator    Social History Main Topics  . Smoking status: Former Smoker  Years: 30.00    Types: Pipe    Quit date: 09/20/2006  . Smokeless tobacco: Never Used  . Alcohol use No  . Drug use: No  . Sexual activity: No     Comment: lives with wife, retired, no dietary restrictions   Other Topics Concern  . Not on file   Social History Narrative  . No narrative on file    Family History  Problem Relation Age of Onset  . Arthritis Sister     "crippling"  . Arthritis Sister     Review of Systems:  As stated in the HPI and otherwise negative.   Pulse 65   Ht 6\' 2"  (1.88 m)   Wt (!) 385 lb (174.6 kg)   SpO2 96%   BMI 49.43 kg/m   Physical Examination: General: Morbidly obese WM in NAD  HEENT: OP clear, mucus membranes moist  SKIN: warm, dry. No rashes. Neuro: No focal deficits  Musculoskeletal: Muscle strength 5/5 all ext  Psychiatric: Mood and affect normal  Neck: unable to assess for JVD, no carotid bruits.  Lungs:Clear bilaterally, no wheezes, rhonci, crackles Cardiovascular: Regular rate and rhythm. Loud harsh systolic murmur. No gallops or rubs. Abdomen:Soft. Bowel sounds present. Non-tender.  Extremities: Plus 2 LE edema  With chronic stasis changes no erythema  Echo: 06/11/16   EF 55-60%  ? LVOT gradient 45 mmHg Grade 2 diastolic  TAVR valve no leak mean gradient 12 mmHg Peak 22 mmHg   EKG:    06/10/16  SR rate 86 RBBB stable   Recent Labs: 07/29/2016: ALT 20; BUN 34; Creatinine, Ser 1.23;  Hemoglobin 11.9; Platelets 152.0; Potassium 5.4; Sodium 138; TSH 1.37   Lipid Panel    Component Value Date/Time   CHOL 143 07/29/2016 1209   TRIG 197.0 (H) 07/29/2016 1209   HDL 28.50 (L) 07/29/2016 1209   CHOLHDL 5 07/29/2016 1209   VLDL 39.4 07/29/2016 1209   LDLCALC 75 07/29/2016 1209     Wt Readings from Last 3 Encounters:  11/11/16 (!) 385 lb (174.6 kg)  11/02/16 (!) 385 lb (174.6 kg)  08/31/16 (!) 385 lb (174.6 kg)     Other studies Reviewed: Additional studies/ records that were reviewed today include: I have reviewed the echo images from today Review of the above records demonstrates: stable bioprosthetic valve   Assessment and Plan:   1. Severe aortic valve stenosis: s/p TAVR. 26 mm valve  Doing well. NYHA class 2 symptoms. Echo 06/11/16 with good function to valve  He will continue ASA and Plavix. He will need antibiotic prophylaxis before dental or surgical procedures. Not clear to me that LVOT gradient Recorded is not MR signal as images are som poor  2. Cellulitis:  Resolved will take tid lasix when he travels to minimize chance of  Skin break down 3. Chol:   Cholesterol is at goal.  Continue current dose of statin and diet Rx.  No myalgias or side effects.  F/U  LFT's in 6 months. Lab Results  Component Value Date   LDLCALC 75 07/29/2016   4. OSA:  Sees Dr Annamaria Boots issues with bipap May need dental appliance and bipap.             Jenkins Rouge

## 2016-11-01 NOTE — Progress Notes (Signed)
Subjective:   [name] is a 76 y.o. male who presents for Medicare Annual/Subsequent preventive examination.  Review of Systems:  No ROS.  Medicare Wellness Visit.  Cardiac Risk Factors include: advanced age (>63men, >96 women);male gender;sedentary lifestyle;obesity (BMI >30kg/m2) Sleep patterns: Wears cpap and O2 at night.  Sleeps 7-8hrs per night.    Home Safety/Smoke Alarms:  Feels safe in home. Smoke alarms in place.  Living environment; residence and Firearm Safety: Lives at home with wife and son in wheelchair and walker accessible home. Seat Belt Safety/Bike Helmet: Wears seat belt.   Counseling:   Eye Exam- Wears bifocals. Visits eye doctor yearly. Dental- Dr.Westin every 6 months.  Male:   CCS-  Last: 02/09/13: Actively bleeding cecal AVM   PSA- No results found for: PSA      Objective:    Vitals: BP 126/68 (BP Location: Left Arm, Patient Position: Sitting, Cuff Size: Large)   Pulse 69   Ht 6\' 2"  (1.88 m)   Wt (!) 385 lb (174.6 kg)   SpO2 96%   BMI 49.43 kg/m   Body mass index is 49.43 kg/m.  Tobacco History  Smoking Status  . Former Smoker  . Years: 30.00  . Types: Pipe  . Quit date: 09/20/2006  Smokeless Tobacco  . Never Used     Counseling given: No   Past Medical History:  Diagnosis Date  . Anemia   . Aortic stenosis   . Aortic stenosis, severe    S/p Edwards Sapien 3 Transcatheter Heart Valve (size 26 mm, model # [id number], serial # [id number])  . Arthritis   . Bursitis   . Cataract    left immature  . Cellulitis 10/12/2015  . Complication of anesthesia    Halucinations  . Constipation    takes Miralax daily as well as Senokot daily  . DDD (degenerative disc disease)   . Gout    takes Allopurinol daily  . History of blood clots 1962   knee  . History of blood transfusion    no abnormal reaction noted  . History of shingles   . Hyperlipidemia    takes Atorvastatin daily  . Hypertension    takes Lisinopril daily  . OSA  (obstructive sleep apnea)   . Osteoarthritis   . Peripheral edema    takes Furosemide daily  . Peripheral neuropathy (HCC)    takes Gabapentin daily  . Peroneal palsy    significant right foot drop  . Pneumonia 25+yrs ago   hx of  . Urinary frequency   . Urinary urgency   . Valvular heart disease    Past Surgical History:  Procedure Laterality Date  . CARDIAC CATHETERIZATION N/A 05/02/2015   Procedure: Right/Left Heart Cath and Coronary Angiography;  Surgeon: Burnell Blanks, MD;  Location: [place] [facility];  Service: Cardiovascular;  Laterality: N/A;  . COLONOSCOPY N/A 02/07/2013   Procedure: COLONOSCOPY;  Surgeon: Juanita Craver, MD;  Location: [facility] ENDOSCOPY;  Service: Endoscopy;  Laterality: N/A;  . COLONOSCOPY N/A 02/09/2013   Procedure: COLONOSCOPY;  Surgeon: Beryle Beams, MD;  Location: [place];  Service: Endoscopy;  Laterality: N/A;  . ESOPHAGOGASTRODUODENOSCOPY N/A 02/07/2013   Procedure: ESOPHAGOGASTRODUODENOSCOPY (EGD);  Surgeon: Juanita Craver, MD;  [facility] ENDOSCOPY;  Service: Endoscopy;  Laterality: N/A;  . GIVENS CAPSULE STUDY N/A 02/09/2013   Procedure: GIVENS CAPSULE STUDY;  Surgeon: Beryle Beams, MD;  Location: [place];  Service: Endoscopy;  Laterality: N/A;  . HERNIA REPAIR  umbilical hernia  . KNEE SURGERY Right    multiple knee surgeries due to complication of R TKA  . LAMINOTOMY  9562   c6-t2  . SPINE SURGERY    . TEE WITHOUT CARDIOVERSION N/A 03/07/2015   Procedure: TRANSESOPHAGEAL ECHOCARDIOGRAM (TEE);  Surgeon: Josue Hector, MD;  Location: [facility] ENDOSCOPY;  Service: Cardiovascular;  Laterality: N/A;  ANES TO BRING PROPOFOL PER DOCTOR  . TEE WITHOUT CARDIOVERSION N/A 06/10/2015   Procedure: TRANSESOPHAGEAL ECHOCARDIOGRAM (TEE);  Surgeon: Burnell Blanks, MD;  Location: [place];  Service: Open Heart Surgery;  Laterality: N/A;  . TOTAL KNEE ARTHROPLASTY     right  . TRANSCATHETER AORTIC VALVE REPLACEMENT, TRANSFEMORAL Left 06/10/2015    Procedure: TRANSCATHETER AORTIC VALVE REPLACEMENT, TRANSFEMORAL;  Surgeon: Burnell Blanks, MD;  Location: [place];  Service: Open Heart Surgery;  Laterality: Left;   Family History  Problem Relation Age of Onset  . Arthritis Sister     "crippling"  . Arthritis Sister    History  Sexual Activity  . Sexual activity: No    Comment: lives with wife, retired, no dietary restrictions    Outpatient Encounter Prescriptions as of 11/02/2016  Medication Sig  . acetaminophen (TYLENOL) 500 MG tablet Take 500 mg by mouth 2 (two) times daily.   [name] albuterol (PROVENTIL HFA;VENTOLIN HFA) 108 (90 Base) MCG/ACT inhaler Inhale 2 puffs into the lungs every 6 (six) hours as needed for wheezing or shortness of breath.  . allopurinol (ZYLOPRIM) 300 MG tablet TAKE 1 TABLET (300 MG TOTAL) BY MOUTH DAILY.  [name] aspirin 81 MG EC tablet TAKE 1 TABLET (81 MG TOTAL) BY MOUTH DAILY.  [name] atorvastatin (LIPITOR) 40 MG tablet Take 40 mg by mouth daily.  . cholecalciferol (VITAMIN D) 1000 UNITS tablet Take 2,000 Units by mouth daily.   . clopidogrel (PLAVIX) 75 MG tablet Take 1 tablet (75 mg total) by mouth daily with breakfast.  . Coenzyme Q10 (COQ10) 200 MG CAPS Take 200 mg by mouth daily.  . furosemide (LASIX) 20 MG tablet Take 1 tablet (20 mg total) by mouth 3 (three) times daily. (Patient taking differently: Take 20 mg by mouth 2 (two) times daily. )  . furosemide (LASIX) 20 MG tablet TAKE 1 TABLET BY MOUTH TWICE A DAY  . gabapentin (NEURONTIN) 300 MG capsule TAKE 1 CAPSULE (300 MG TOTAL) BY MOUTH 2 (TWO) TIMES DAILY.  . hydrocortisone (ANUSOL-HC) 25 MG suppository Place 1 suppository (25 mg total) rectally 2 (two) times daily.  [name] lisinopril (PRINIVIL,ZESTRIL) 10 MG tablet TAKE 1 TABLET BY MOUTH EVERY DAY  . loratadine (CLARITIN) 10 MG tablet Take 1 tablet (10 mg total) by mouth daily.  . Menthol-Methyl Salicylate (MUSCLE RUB) 10-15 % CREA Apply 1 application topically as directed.   . Multiple Vitamin (MULTIVITAMIN  WITH MINERALS) TABS Take 1 tablet by mouth daily.  . polyethylene glycol (MIRALAX / GLYCOLAX) packet Take 17 g by mouth daily.  . predniSONE (DELTASONE) 20 MG tablet Take 2 tablets (40 mg total) by mouth daily with breakfast.  . sennosides-docusate sodium (SENOKOT-S) 8.6-50 MG tablet Take 1 tablet by mouth 2 (two) times daily.  . traMADol (ULTRAM) 50 MG tablet TAKE 1 TABLET BY MOUTH EVERY 6 HOURS AS NEEDED   No facility-administered encounter medications on file as of 11/02/2016.     Activities of Daily Living In your present state of health, do you have any difficulty performing the following activities: 11/02/2016 07/29/2016  Hearing? N N  Vision? N N  Difficulty concentrating  or making decisions? N N  Walking or climbing stairs? Y N  Dressing or bathing? Y N  Doing errands, shopping? Y N  Preparing Food and eating ? N -  Using the Toilet? N -  In the past six months, have you accidently leaked urine? N -  Do you have problems with loss of bowel control? N -  Managing your Medications? N -  Managing your Finances? N -  Housekeeping or managing your Housekeeping? N -  Some recent data might be hidden    Patient Care Team: [name], MD as PCP - General (Family Medicine) Josue Hector, MD as Consulting Physician (Cardiology)   Assessment:    Physical assessment deferred to PCP.  Exercise Activities and Dietary recommendations Current Exercise Habits: The patient does not participate in regular exercise at present, Exercise limited by: orthopedic condition(s)   Diet (meal preparation, eat out, water intake, caffeinated beverages, dairy products, fruits and vegetables): in general, a "healthy" diet  , on average, 3 meals per day  Drinks 6-8 bottles of water per day.      Goals    None     Fall Risk Fall Risk  11/02/2016 07/29/2016  Falls in the past year? No No  Some recent data might be hidden   Depression Screen PHQ 2/9 Scores 11/02/2016 07/29/2016  PHQ - 2 Score 0  0  Some recent data might be hidden    Cognitive Function MMSE - Mini Mental State Exam 11/02/2016  Orientation to time 5  Orientation to Place 5  Registration 3  Attention/ Calculation 5  Recall 2  Language- name 2 objects 2  Language- repeat 1  Language- follow 3 step command 3  Language- read & follow direction 1  Write a sentence 1  Copy design 1  Total score 29  Some recent data might be hidden        Immunization History  Administered Date(s) Administered  . Influenza Split 07/08/2011, 06/20/2012  . Influenza, High Dose Seasonal PF 06/22/2016  . Influenza,inj,Quad PF,36+ Mos 06/07/2013, 07/29/2014, 07/18/2015  . PPD Test 02/16/2013  . Pneumococcal Conjugate-13 10/11/2013  . Pneumococcal Polysaccharide-23 11/07/2015  . Td 08/26/2006  . Zoster 07/27/2013   Screening Tests Health Maintenance  Topic Date Due  . TETANUS/TDAP  08/26/2016  . COLONOSCOPY  02/10/2023  . INFLUENZA VACCINE  Completed  . ZOSTAVAX  Completed  . PNA vac Low Risk Adult  Completed      Plan:     Follow up with Dr.Blyth as scheduled.  Continue to eat heart healthy diet (full of fruits, vegetables, whole grains, lean protein, water--limit salt, fat, and sugar intake) and increase physical activity as tolerated.  Bring a copy of your advance directives to your next office visit.  During the course of the visit the patient was educated and counseled about the following appropriate screening and preventive services:   Vaccines to include Pneumoccal, Influenza, Hepatitis B, Td, Zostavax, HCV  Cardiovascular Disease  Colorectal cancer screening  Diabetes screening  Prostate Cancer Screening  Glaucoma screening  Nutrition counseling   Patient Instructions (the written plan) was given to the patient.    Naaman Plummer [place], [place]  11/02/2016

## 2016-11-01 NOTE — Progress Notes (Signed)
Pre visit review using our clinic review tool, if applicable. No additional management support is needed unless otherwise documented below in the visit note. 

## 2016-11-02 ENCOUNTER — Ambulatory Visit (INDEPENDENT_AMBULATORY_CARE_PROVIDER_SITE_OTHER): Payer: PPO | Admitting: *Deleted

## 2016-11-02 ENCOUNTER — Encounter: Payer: Self-pay | Admitting: *Deleted

## 2016-11-02 ENCOUNTER — Ambulatory Visit: Payer: PPO | Admitting: *Deleted

## 2016-11-02 VITALS — BP 126/68 | HR 69 | Ht 74.0 in | Wt 385.0 lb

## 2016-11-02 DIAGNOSIS — Z0000 Encounter for general adult medical examination without abnormal findings: Secondary | ICD-10-CM | POA: Diagnosis not present

## 2016-11-02 NOTE — Patient Instructions (Signed)
Follow up with Dr.Blyth as scheduled.  Continue to eat heart healthy diet (full of fruits, vegetables, whole grains, lean protein, water--limit salt, fat, and sugar intake) and increase physical activity as tolerated.  Bring a copy of your advance directives to your next office visit.

## 2016-11-02 NOTE — Progress Notes (Signed)
RN AWV note reviewed. Agree with documention and plan. 

## 2016-11-11 ENCOUNTER — Ambulatory Visit (INDEPENDENT_AMBULATORY_CARE_PROVIDER_SITE_OTHER): Payer: PPO | Admitting: Cardiovascular Disease

## 2016-11-11 ENCOUNTER — Encounter: Payer: Self-pay | Admitting: Cardiovascular Disease

## 2016-11-11 VITALS — HR 65 | Ht 74.0 in | Wt 385.0 lb

## 2016-11-11 DIAGNOSIS — I359 Nonrheumatic aortic valve disorder, unspecified: Secondary | ICD-10-CM | POA: Diagnosis not present

## 2016-11-11 NOTE — Patient Instructions (Signed)

## 2016-11-16 DIAGNOSIS — G4733 Obstructive sleep apnea (adult) (pediatric): Secondary | ICD-10-CM | POA: Diagnosis not present

## 2016-11-16 DIAGNOSIS — R0602 Shortness of breath: Secondary | ICD-10-CM | POA: Diagnosis not present

## 2016-11-18 ENCOUNTER — Ambulatory Visit: Payer: PPO | Admitting: Internal Medicine

## 2016-11-19 DIAGNOSIS — G4733 Obstructive sleep apnea (adult) (pediatric): Secondary | ICD-10-CM | POA: Diagnosis not present

## 2016-12-03 ENCOUNTER — Other Ambulatory Visit: Payer: Self-pay | Admitting: Family Medicine

## 2016-12-03 ENCOUNTER — Encounter: Payer: Self-pay | Admitting: Family Medicine

## 2016-12-03 NOTE — Telephone Encounter (Signed)
Printed hardcopy/contract/PCP signed. Informed the patient of information, he stated his wife has appt this coming Tuesday and will pickup hardcopy and do contract/UDS at that time. Hardcopy/contract at the front desk ready for pickup.

## 2016-12-03 NOTE — Telephone Encounter (Signed)
Requesting:   Tramadol Contract   None UDS   None Last OV   07/29/2016----next scheduled appt is on 01/25/2017 Last Refill   #90 with 0 refills on 09/06/2016  Please Advise

## 2016-12-03 NOTE — Telephone Encounter (Signed)
Ok to refill but let him know with new law we need uds and contract. He does have trouble with transportation so he can have #10 called in til able to come in

## 2016-12-06 ENCOUNTER — Other Ambulatory Visit: Payer: Self-pay | Admitting: Family Medicine

## 2016-12-07 ENCOUNTER — Telehealth: Payer: Self-pay | Admitting: Family Medicine

## 2016-12-07 ENCOUNTER — Other Ambulatory Visit: Payer: Self-pay

## 2016-12-07 NOTE — Telephone Encounter (Signed)
Left pt message asking to call Allison back directly at [phone] to schedule AWV. Thanks! °

## 2016-12-09 DIAGNOSIS — Z79891 Long term (current) use of opiate analgesic: Secondary | ICD-10-CM | POA: Diagnosis not present

## 2016-12-14 DIAGNOSIS — G4733 Obstructive sleep apnea (adult) (pediatric): Secondary | ICD-10-CM | POA: Diagnosis not present

## 2016-12-14 DIAGNOSIS — R0602 Shortness of breath: Secondary | ICD-10-CM | POA: Diagnosis not present

## 2016-12-20 ENCOUNTER — Telehealth: Payer: Self-pay | Admitting: Internal Medicine

## 2016-12-20 DIAGNOSIS — G4733 Obstructive sleep apnea (adult) (pediatric): Secondary | ICD-10-CM | POA: Diagnosis not present

## 2016-12-20 NOTE — Telephone Encounter (Addendum)
Called and spoke to pt. Pt states he is going to be going out of town for 3-4 days and is questioning if it is ok to be without his O2, pt is using 1lpm of O2 while on CPAP. Pt will still be using his CPAP but is unable to take the O2 concentrator.   Dr. Annamaria Boots please advise. Thanks.

## 2016-12-20 NOTE — Telephone Encounter (Signed)
Always safest to continue oxygen if do-able, but realize the concentrator is not portable.  If you contact your DME, they often have a reciprocal relationship with a provider at your destination and can arrange to have O2 delivered where you will be staying. Sometimes the DME can rent you a portable concentrator to use while you are away.  You will probably  be ok if you travel with just your CPAP, if that turns out to be what you need to do.

## 2016-12-20 NOTE — Telephone Encounter (Signed)
LMOMTCB x 1 

## 2016-12-21 NOTE — Telephone Encounter (Signed)
Patient returned phone call... Patient contact # [phone].[name]

## 2016-12-21 NOTE — Telephone Encounter (Signed)
atc pt, line rang with no answer, no vm.  wcb.

## 2016-12-21 NOTE — Telephone Encounter (Signed)
Patient returned phone call. Patient contact # [phone].[name]

## 2016-12-21 NOTE — Telephone Encounter (Signed)
Spoke with pt, aware of below recs.  Nothing further needed.  

## 2016-12-28 ENCOUNTER — Encounter: Payer: Self-pay | Admitting: Internal Medicine

## 2016-12-28 ENCOUNTER — Ambulatory Visit (INDEPENDENT_AMBULATORY_CARE_PROVIDER_SITE_OTHER): Payer: PPO | Admitting: Internal Medicine

## 2016-12-28 DIAGNOSIS — G4733 Obstructive sleep apnea (adult) (pediatric): Secondary | ICD-10-CM | POA: Diagnosis not present

## 2016-12-28 DIAGNOSIS — J449 Chronic obstructive pulmonary disease, unspecified: Secondary | ICD-10-CM | POA: Diagnosis not present

## 2016-12-28 MED ORDER — METHYLPHENIDATE HCL 10 MG PO TABS: 10.0000 mg | ORAL_TABLET | Freq: Two times a day (BID) | ORAL | 0 refills | Status: DC

## 2016-12-28 NOTE — Assessment & Plan Note (Signed)
A restriction/hypoventilation component related to his morbid obesity is so obvious, that it is difficult to assess the importance of his obstructive disease.  He doesn't cough or wheeze much and uses rescue inhaler infrequently.

## 2016-12-28 NOTE — Assessment & Plan Note (Signed)
He has not accepted referral for bariatric surgery consultation

## 2016-12-28 NOTE — Patient Instructions (Signed)
We can continue BIPAP 21/17, PS4, mask of choice, humidifier, supplies, AirView    Dx OSA  Script printed to try ritalin 10 mg, once or twice daily. See if it makes you comfortably more alert.   Please call as needed

## 2016-12-28 NOTE — Assessment & Plan Note (Signed)
He is satisfied with his current mask  And continuing breakthrough event which appear to be mostly not obstructive apneas. Considering if we would do better "by the numbers" with a NIV/ ASV machine. Rather than disrupting everything I'm going to let him explore what low dose ritalin might do. Appropriate discussion.

## 2016-12-28 NOTE — Progress Notes (Signed)
HPI M former smoker followed for OSA complicated by morbid obesity, aortic stenosis/ AVR,  Osteoarthritis PFT 05/05/2015-severe obstructive airways disease, insignificant response to bronchodilator, severe restriction, moderate diffusion defect FVC 2.17/46%, FEV1 1.50/44%, ratio 0.69, TLC 64%, DLCO 57% with volume correction to 62% of predicted NPSG 06/21/85- AHI 98/hour with desaturation to 64% BiPAP titration study-21/17, PS 4 cwp O2 2L sleep, with residual AHI 52 "unknown" events, either obstructive or central and presumably mostly RERAs.  ------------------------  01/05/2016-76 year old male former smoker followed for OSA complicated by morbid obesity, aortic stenosis/ AVR, osteoarthritis CPAP 18/Advanced Follows for: OSA. Pt states that his CPAP machine is broken and he has a temporary machine from [facility]. Pt did bring the SD card from the current machine but there is no data available. Pt states that he does wear CPAP nightly for about 8 hours nightly. Pt does feel the machine works well and he does feel rested.  CXR 06/11/2015-IMPRESSION: 1. Right IJ sheath and Swan-Ganz catheter stable position. 2. Prior aortic valve replacement. Stable cardiomegaly. 3. Low lung volumes with bibasilar atelectasis.  08/31/2016-76 year old male former smoker followed for OSA, complicated by morbid obesity, aortic stenosis/aVR, osteoarthritis BiPAP titration study-21/17, PS 4 cwp O2 2L sleep, with residual AHI 52 "unknown" events, either obstructive or central and presumably mostly RERAs.  FOLLOWS FOR:  Wears BiPAP nightly. Denies any issues with mask/pressure. Pt reports having excessive sleepiness since being started on O2 with BiPAP. DME: [facility] Download continues to show elevated residual AHI 53.1/hour despite 100% compliance. [name] He associated that with starting oxygen through his machine. Denies morning headache. Wife describes a mild steady snoring noise through his mask without witnessed apneas. He asks  about oral appliances and we discussed the possibility as a supplement, not replacement for his BiPAP. Uses nasal mask.   12/28/2016- 76 year old male former smoker followed for OSA, complicated by morbid obesity, aortic stenosis/aVR, osteoarthritis BiPAP 21/17, PS 4   O2 2 L/sleep /Advanced FOLLOWS FOR: DME:[facility] Pt wears BiPAP nightly. DL attached. . Pt uses O2  at night with BiPAP. He wants to continue current mask. He feels better with BiPAP than without.  He sits watching TV all day; admits catnaps intermittently especially when room is warm. Significant leak around 53L/min.  He notices feeling a little more sleepy in the daytime, falling asleep in his TV chair.    ROS- see HPI Constitutional:   No-   weight loss, night sweats, fevers, chills, +fatigue, lassitude. HEENT:   No-  headaches, difficulty swallowing, tooth/dental problems, sore throat,       No-  sneezing, itching, ear ache, nasal congestion, post nasal drip,  CV:  No-   chest pain, orthopnea, PND, swelling in lower extremities, anasarca,  dizziness, palpitations Resp: +shortness of breath with exertion or at rest.              No-   productive cough,  No non-productive cough,  No- coughing up of blood.              No-   change in color of mucus.  No- wheezing.   Skin: No-   rash or lesions. GI:  No-   heartburn, indigestion, abdominal pain, nausea, vomiting, GU:  MS:  No-   joint pain or swelling.   Neuro-     nothing unusual Psych:  No- change in mood or affect. No depression or anxiety.  No memory loss.  OBJ General- Alert, Oriented, Affect-appropriate, Distress- none acute. +Morbidly obese, +power wheelchair  Skin-  rash-none, lesions- none, excoriation- none Lymphadenopathy- none Head- atraumatic            Eyes- Gross vision intact, PERRLA, conjunctivae clear secretions            Ears- Hearing, canals-normal            Nose- Clear, no-Septal dev, mucus, polyps, erosion, perforation             Throat- Mallampati  III-IV , mucosa clear , drainage- none, tonsils- atrophic Neck- flexible , trachea midline, no stridor , thyroid nl, carotid no bruit Chest - symmetrical excursion , unlabored           Heart/CV- RRR , murmur-none, no gallop, no rub, nl s1 s2                           - JVD- none , edema+, stasis changes+, varices- none           Lung- clear to P&A, wheeze- none, cough- none , dullness-none, rub- none, no valve click heard           Chest wall-  Abd-  Br/ Gen/ Rectal- Not done, not indicated Extrem- cyanosis- none, clubbing, none, atrophy- none, strength- nl Neuro- grossly intact to observation, alert and pleasant

## 2017-01-04 ENCOUNTER — Telehealth: Payer: Self-pay | Admitting: Internal Medicine

## 2017-01-04 NOTE — Telephone Encounter (Signed)
PA started on methylphenidate 10mg  tablets on [email or web address].   PA # is [id number]  Member ID is [id number]  Will check on PA status in 24 hours.

## 2017-01-05 NOTE — Telephone Encounter (Signed)
Blase Mess [id number]( ref # [id number]); requesting to do prior authorization over the phone...ert

## 2017-01-05 NOTE — Telephone Encounter (Signed)
lmtcb X1 for [facility]

## 2017-01-07 ENCOUNTER — Other Ambulatory Visit: Payer: Self-pay | Admitting: Family Medicine

## 2017-01-11 NOTE — Telephone Encounter (Signed)
lmom tcb x2 to envision

## 2017-01-14 ENCOUNTER — Other Ambulatory Visit: Payer: Self-pay | Admitting: Family Medicine

## 2017-01-14 DIAGNOSIS — G4733 Obstructive sleep apnea (adult) (pediatric): Secondary | ICD-10-CM | POA: Diagnosis not present

## 2017-01-14 DIAGNOSIS — R0602 Shortness of breath: Secondary | ICD-10-CM | POA: Diagnosis not present

## 2017-01-19 ENCOUNTER — Ambulatory Visit (INDEPENDENT_AMBULATORY_CARE_PROVIDER_SITE_OTHER): Payer: PPO | Admitting: Medical

## 2017-01-19 VITALS — BP 128/65 | HR 93 | Temp 98.0°F | Resp 16

## 2017-01-19 DIAGNOSIS — S46812A Strain of other muscles, fascia and tendons at shoulder and upper arm level, left arm, initial encounter: Secondary | ICD-10-CM

## 2017-01-19 DIAGNOSIS — G4733 Obstructive sleep apnea (adult) (pediatric): Secondary | ICD-10-CM | POA: Diagnosis not present

## 2017-01-19 MED ORDER — CYCLOBENZAPRINE HCL 5 MG PO TABS
5.0000 mg | ORAL_TABLET | Freq: Every day | ORAL | 0 refills | Status: DC
Start: 2017-01-19 — End: 2017-01-25

## 2017-01-19 NOTE — Patient Instructions (Addendum)
For your trapezius pain will rx flexeril 5 mg tab to use at night.   Can use Ben-Gay to area as well.  Tyelnol for pain if needed. Will avoid nsaids.  If pain persists by early next week please let me know and will refer to sports medicine.  Follow up in 5 days or as needed

## 2017-01-19 NOTE — Progress Notes (Signed)
Pre visit review using our clinic review tool, if applicable. No additional management support is needed unless otherwise documented below in the visit note. 

## 2017-01-19 NOTE — Progress Notes (Signed)
Subjective:    Patient ID: [name], male    DOB: [birth year], 76 y.o.   MRN: [id number]  HPI  Pt in describes left trapezius pain for about a week. Pain will occur intermittently. He noticed at first when he was using walker and turning real quickly. Pain is occurring sporadically. Pt describes pain at times associated with position change of lt upper extremity. Actually he does not have shoulder pain. Pain will last for seconds most of time. At most will last 3-5 minutes. Pain occurs very frequently.   Review of Systems  Constitutional: Negative for chills, fatigue and fever.  Respiratory: Negative for cough, chest tightness, shortness of breath and wheezing.   Cardiovascular: Negative for chest pain and palpitations.  Musculoskeletal: Positive for neck pain. Negative for myalgias and neck stiffness.       Trapezius pain.  Skin: Negative for rash.  Hematological: Negative for adenopathy. Does not bruise/bleed easily.  Psychiatric/Behavioral: Negative for behavioral problems and confusion.    Past Medical History:  Diagnosis Date  . Anemia   . Aortic stenosis   . Aortic stenosis, severe    S/p Edwards Sapien 3 Transcatheter Heart Valve (size 26 mm, model # [id number], serial # [id number])  . Arthritis   . Bursitis   . Cataract    left immature  . Cellulitis 10/12/2015  . Complication of anesthesia    Halucinations  . Constipation    takes Miralax daily as well as Senokot daily  . DDD (degenerative disc disease)   . Gout    takes Allopurinol daily  . History of blood clots 1962   knee  . History of blood transfusion    no abnormal reaction noted  . History of shingles   . Hyperlipidemia    takes Atorvastatin daily  . Hypertension    takes Lisinopril daily  . OSA (obstructive sleep apnea)   . Osteoarthritis   . Peripheral edema    takes Furosemide daily  . Peripheral neuropathy    takes Gabapentin daily  . Peroneal palsy    significant right foot drop  .  Pneumonia 25+yrs ago   hx of  . Urinary frequency   . Urinary urgency   . Valvular heart disease      Social History   Social History  . Marital status: Married    Spouse name: N/A  . Number of children: N/A  . Years of education: N/A   Occupational History  . retired Administrator    Social History Main Topics  . Smoking status: Former Smoker    Years: 30.00    Types: Pipe    Quit date: 09/20/2006  . Smokeless tobacco: Never Used  . Alcohol use No  . Drug use: No  . Sexual activity: No     Comment: lives with wife, retired, no dietary restrictions   Other Topics Concern  . Not on file   Social History Narrative  . No narrative on file    Past Surgical History:  Procedure Laterality Date  . CARDIAC CATHETERIZATION N/A 05/02/2015   Procedure: Right/Left Heart Cath and Coronary Angiography;  Surgeon: Burnell Blanks, MD;  Location: [facility];  Service: Cardiovascular;  Laterality: N/A;  . COLONOSCOPY N/A 02/07/2013   Procedure: COLONOSCOPY;  Surgeon: Juanita Craver, MD;  Location: [facility] ENDOSCOPY;  Service: Endoscopy;  Laterality: N/A;  . COLONOSCOPY N/A 02/09/2013   Procedure: COLONOSCOPY;  Surgeon: Beryle Beams, MD;  Location: [place];  Service: Endoscopy;  Laterality: N/A;  . ESOPHAGOGASTRODUODENOSCOPY N/A 02/07/2013   Procedure: ESOPHAGOGASTRODUODENOSCOPY (EGD);  Surgeon: Juanita Craver, MD;  Location: [facility] ENDOSCOPY;  Service: Endoscopy;  Laterality: N/A;  . GIVENS CAPSULE STUDY N/A 02/09/2013   Procedure: GIVENS CAPSULE STUDY;  Surgeon: Beryle Beams, MD;  Location: [place];  Service: Endoscopy;  Laterality: N/A;  . HERNIA REPAIR     umbilical hernia  . KNEE SURGERY Right    multiple knee surgeries due to complication of R TKA  . LAMINOTOMY  [id number]   c6-t2  . SPINE SURGERY    . TEE WITHOUT CARDIOVERSION N/A 03/07/2015   Procedure: TRANSESOPHAGEAL ECHOCARDIOGRAM (TEE);  Surgeon: Josue Hector, MD;  Location: [facility] ENDOSCOPY;  Service: Cardiovascular;   Laterality: N/A;  ANES TO BRING PROPOFOL PER DOCTOR  . TEE WITHOUT CARDIOVERSION N/A 06/10/2015   Procedure: TRANSESOPHAGEAL ECHOCARDIOGRAM (TEE);  Surgeon: Burnell Blanks, MD;  Location: [place];  Service: Open Heart Surgery;  Laterality: N/A;  . TOTAL KNEE ARTHROPLASTY     right  . TRANSCATHETER AORTIC VALVE REPLACEMENT, TRANSFEMORAL Left 06/10/2015   Procedure: TRANSCATHETER AORTIC VALVE REPLACEMENT, TRANSFEMORAL;  Surgeon: Burnell Blanks, MD;  Location: [place];  Service: Open Heart Surgery;  Laterality: Left;    Family History  Problem Relation Age of Onset  . Arthritis Sister     "crippling"  . Arthritis Sister     Allergies  Allergen Reactions  . Coumadin [Warfarin Sodium] Other (See Comments)    States he can't be on this-bleeds out    Current Outpatient Prescriptions on File Prior to Visit  Medication Sig Dispense Refill  . acetaminophen (TYLENOL) 500 MG tablet Take 500 mg by mouth 2 (two) times daily.     [name] albuterol (PROVENTIL HFA;VENTOLIN HFA) 108 (90 Base) MCG/ACT inhaler Inhale 2 puffs into the lungs every 6 (six) hours as needed for wheezing or shortness of breath. 1 Inhaler 0  . allopurinol (ZYLOPRIM) 300 MG tablet TAKE 1 TABLET (300 MG TOTAL) BY MOUTH DAILY. 30 tablet 6  . aspirin 81 MG EC tablet TAKE 1 TABLET (81 MG TOTAL) BY MOUTH DAILY. 30 tablet 11  . atorvastatin (LIPITOR) 40 MG tablet Take 40 mg by mouth daily.    [name] atorvastatin (LIPITOR) 40 MG tablet TAKE 1 TABLET BY MOUTH EVERY DAY 90 tablet 2  . cholecalciferol (VITAMIN D) 1000 UNITS tablet Take 2,000 Units by mouth daily.     . clopidogrel (PLAVIX) 75 MG tablet Take 1 tablet (75 mg total) by mouth daily with breakfast. 90 tablet 3  . Coenzyme Q10 (COQ10) 200 MG CAPS Take 200 mg by mouth daily.    . furosemide (LASIX) 20 MG tablet Take 1 tablet (20 mg total) by mouth 3 (three) times daily. (Patient taking differently: Take 20 mg by mouth 2 (two) times daily. ) 270 tablet 3  . furosemide (LASIX) 20  MG tablet TAKE 1 TABLET BY MOUTH TWICE A DAY 180 tablet 3  . gabapentin (NEURONTIN) 300 MG capsule TAKE 1 CAPSULE (300 MG TOTAL) BY MOUTH 2 (TWO) TIMES DAILY. 180 capsule 1  . hydrocortisone (ANUSOL-HC) 25 MG suppository Place 1 suppository (25 mg total) rectally 2 (two) times daily. 12 suppository 0  . lisinopril (PRINIVIL,ZESTRIL) 10 MG tablet TAKE 1 TABLET BY MOUTH EVERY DAY 90 tablet 0  . loratadine (CLARITIN) 10 MG tablet Take 1 tablet (10 mg total) by mouth daily. 30 tablet 0  . Menthol-Methyl Salicylate (MUSCLE RUB) 10-15 % CREA Apply 1 application topically as directed.     [name]  methylphenidate (RITALIN) 10 MG tablet Take 1 tablet (10 mg total) by mouth 2 (two) times daily. 60 tablet 0  . Multiple Vitamin (MULTIVITAMIN WITH MINERALS) TABS Take 1 tablet by mouth daily.    . polyethylene glycol (MIRALAX / GLYCOLAX) packet Take 17 g by mouth daily.    . predniSONE (DELTASONE) 20 MG tablet Take 2 tablets (40 mg total) by mouth daily with breakfast. 6 tablet 0  . sennosides-docusate sodium (SENOKOT-S) 8.6-50 MG tablet Take 1 tablet by mouth 2 (two) times daily.    . traMADol (ULTRAM) 50 MG tablet TAKE 1 TABLET EVERY 6 HOURS AS NEEDED 90 tablet 0   No current facility-administered medications on file prior to visit.     BP 128/65 (BP Location: Left Arm, Patient Position: Sitting, Cuff Size: Large)   Pulse 93   Temp 98 F (36.7 C) (Oral)   Resp 16   SpO2 96%       Objective:   Physical Exam   General- No acute distress. Pleasant patient. Neck- Full range of motion, no jvd. Lt side trapezius tenderness to palpation. Point tender area just adjacent to shoulder. Lungs- Clear, even and unlabored. Heart- regular rate and rhythm. Neurologic- CNII- XII grossly intact.  Lt shoulder- good rom with no  pain in shoulder. No pain on palpation of shoulder        Assessment & Plan:  For your trapezius pain will rx flexeril 5 mg tab to use at night.   Can use Ben-Gay to area as  well.  Tyelnol for pain if needed. Will avoid nsaids.  If pain persists by early next week please let me know and will refer to sports medicine.  Follow up in 5 days or as needed

## 2017-01-20 ENCOUNTER — Telehealth: Payer: Self-pay

## 2017-01-20 NOTE — Telephone Encounter (Signed)
Received another PA for patient's Ritalin 10mg . Called [facility] at [phone] to check on status of PA. PA was originally denied on 01/11/17. We may have received paperwork but that was during the time that our fax machines were down. Blase Mess will send [name] another copy of the denial letter.   Will keep this note open and route to [facility] for follow up.   Member ID: [id number]

## 2017-01-22 NOTE — Telephone Encounter (Signed)
Scheduled 11/02/16

## 2017-01-25 ENCOUNTER — Encounter: Payer: Self-pay | Admitting: Family Medicine

## 2017-01-25 ENCOUNTER — Ambulatory Visit (INDEPENDENT_AMBULATORY_CARE_PROVIDER_SITE_OTHER): Payer: PPO | Admitting: Family Medicine

## 2017-01-25 VITALS — BP 127/70 | HR 65 | Temp 98.4°F | Ht 74.0 in | Wt >= 6400 oz

## 2017-01-25 DIAGNOSIS — Z0000 Encounter for general adult medical examination without abnormal findings: Secondary | ICD-10-CM | POA: Diagnosis not present

## 2017-01-25 DIAGNOSIS — R0789 Other chest pain: Secondary | ICD-10-CM

## 2017-01-25 DIAGNOSIS — E7800 Pure hypercholesterolemia, unspecified: Secondary | ICD-10-CM | POA: Diagnosis not present

## 2017-01-25 DIAGNOSIS — M542 Cervicalgia: Secondary | ICD-10-CM | POA: Insufficient documentation

## 2017-01-25 DIAGNOSIS — D649 Anemia, unspecified: Secondary | ICD-10-CM | POA: Diagnosis not present

## 2017-01-25 DIAGNOSIS — M545 Low back pain, unspecified: Secondary | ICD-10-CM

## 2017-01-25 DIAGNOSIS — G4733 Obstructive sleep apnea (adult) (pediatric): Secondary | ICD-10-CM

## 2017-01-25 DIAGNOSIS — D696 Thrombocytopenia, unspecified: Secondary | ICD-10-CM | POA: Diagnosis not present

## 2017-01-25 DIAGNOSIS — M1A9XX0 Chronic gout, unspecified, without tophus (tophi): Secondary | ICD-10-CM | POA: Diagnosis not present

## 2017-01-25 DIAGNOSIS — J069 Acute upper respiratory infection, unspecified: Secondary | ICD-10-CM

## 2017-01-25 HISTORY — DX: Cervicalgia: M54.2

## 2017-01-25 HISTORY — DX: Low back pain, unspecified: M54.50

## 2017-01-25 LAB — CBC
HCT: 37.9 % — ABNORMAL LOW (ref 39.0–52.0)
Hemoglobin: 12.3 g/dL — ABNORMAL LOW (ref 13.0–17.0)
MCHC: 32.5 g/dL (ref 30.0–36.0)
MCV: 95.7 fl (ref 78.0–100.0)
Platelets: 175 10*3/uL (ref 150.0–400.0)
RBC: 3.96 Mil/uL — ABNORMAL LOW (ref 4.22–5.81)
RDW: 16.7 % — ABNORMAL HIGH (ref 11.5–15.5)
WBC: 8.8 10*3/uL (ref 4.0–10.5)

## 2017-01-25 LAB — COMPREHENSIVE METABOLIC PANEL
ALT: 18 U/L (ref 0–53)
AST: 21 U/L (ref 0–37)
Albumin: 3.9 g/dL (ref 3.5–5.2)
Alkaline Phosphatase: 80 U/L (ref 39–117)
BUN: 34 mg/dL — ABNORMAL HIGH (ref 6–23)
CO2: 33 mEq/L — ABNORMAL HIGH (ref 19–32)
Calcium: 9.5 mg/dL (ref 8.4–10.5)
Chloride: 100 mEq/L (ref 96–112)
Creatinine, Ser: 1.37 mg/dL (ref 0.40–1.50)
GFR: 53.91 mL/min — ABNORMAL LOW (ref >60.00)
Glucose, Bld: 96 mg/dL (ref 70–99)
Potassium: 5 mEq/L (ref 3.5–5.1)
Sodium: 137 mEq/L (ref 135–145)
Total Bilirubin: 0.5 mg/dL (ref 0.2–1.2)
Total Protein: 6.7 g/dL (ref 6.0–8.3)

## 2017-01-25 LAB — URIC ACID: Uric Acid, Serum: 6.8 mg/dL (ref 4.0–7.8)

## 2017-01-25 LAB — TSH: TSH: 1.18 u[IU]/mL (ref 0.35–4.50)

## 2017-01-25 LAB — LIPID PANEL
Cholesterol: 138 mg/dL (ref 0–200)
HDL: 27.2 mg/dL — ABNORMAL LOW (ref >39.00)
LDL Cholesterol: 71 mg/dL (ref 0–99)
NonHDL: 110.31
Total CHOL/HDL Ratio: 5
Triglycerides: 197 mg/dL — ABNORMAL HIGH (ref 0.0–149.0)
VLDL: 39.4 mg/dL (ref 0.0–40.0)

## 2017-01-25 MED ORDER — TIZANIDINE HCL 4 MG PO TABS: 4.0000 mg | ORAL_TABLET | Freq: Four times a day (QID) | ORAL | 1 refills | Status: DC | PRN

## 2017-01-25 MED ORDER — PREDNISONE 20 MG PO TABS: 40.0000 mg | ORAL_TABLET | Freq: Every day | ORAL | 0 refills | Status: DC

## 2017-01-25 NOTE — Assessment & Plan Note (Signed)
New mask is irritating his skin on the right temple. Is using and continued to keep using but to coer skin with antibiotic ointment and bandaid

## 2017-01-25 NOTE — Progress Notes (Signed)
Pre visit review using our clinic review tool, if applicable. No additional management support is needed unless otherwise documented below in the visit note. 

## 2017-01-25 NOTE — Assessment & Plan Note (Signed)
His neurosurgeon, Dr Rita Ohara is seeing him on 02/08/17. His pain is keeping him up at night. He is too large to get into MRI machines. Encouraged moist heat and gentle stretching as tolerated. May try NSAIDs and prescription meds as directed and report if symptoms worsen or seek immediate care. Apply moist heat and lidocaine patches

## 2017-01-25 NOTE — Assessment & Plan Note (Signed)
Increase leafy greens, consider increased lean red meat and using cast iron cookware. Continue to monitor, report any concerns 

## 2017-01-25 NOTE — Assessment & Plan Note (Signed)
No recent flares, check uric acid level 

## 2017-01-25 NOTE — Patient Instructions (Addendum)
Moist heat for 15 minutes to affected area three times daily. Apply Lidocaine gel or patches twice daily. Can refer to sports medicine as another option for working on the neck and low back    Preventive Care 75 Years and Older, Male Preventive care refers to lifestyle choices and visits with your health care provider that can promote health and wellness. What does preventive care include?  A yearly physical exam. This is also called an annual well check.  Dental exams once or twice a year.  Routine eye exams. Ask your health care provider how often you should have your eyes checked.  Personal lifestyle choices, including:  Daily care of your teeth and gums.  Regular physical activity.  Eating a healthy diet.  Avoiding tobacco and drug use.  Limiting alcohol use.  Practicing safe sex.  Taking low doses of aspirin every day.  Taking vitamin and mineral supplements as recommended by your health care provider. What happens during an annual well check? The services and screenings done by your health care provider during your annual well check will depend on your age, overall health, lifestyle risk factors, and family history of disease. Counseling  Your health care provider may ask you questions about your:  Alcohol use.  Tobacco use.  Drug use.  Emotional well-being.  Home and relationship well-being.  Sexual activity.  Eating habits.  History of falls.  Memory and ability to understand (cognition).  Work and work Statistician. Screening  You may have the following tests or measurements:  Height, weight, and BMI.  Blood pressure.  Lipid and cholesterol levels. These may be checked every 5 years, or more frequently if you are over 42 years old.  Skin check.  Lung cancer screening. You may have this screening every year starting at age 77 if you have a 30-pack-year history of smoking and currently smoke or have quit within the past 15 years.  Fecal occult  blood test (FOBT) of the stool. You may have this test every year starting at age 55.  Flexible sigmoidoscopy or colonoscopy. You may have a sigmoidoscopy every 5 years or a colonoscopy every 10 years starting at age 75.  Prostate cancer screening. Recommendations will vary depending on your family history and other risks.  Hepatitis C blood test.  Hepatitis B blood test.  Sexually transmitted disease (STD) testing.  Diabetes screening. This is done by checking your blood sugar (glucose) after you have not eaten for a while (fasting). You may have this done every 1-3 years.  Abdominal aortic aneurysm (AAA) screening. You may need this if you are a current or former smoker.  Osteoporosis. You may be screened starting at age 28 if you are at high risk. Talk with your health care provider about your test results, treatment options, and if necessary, the need for more tests. Vaccines  Your health care provider may recommend certain vaccines, such as:  Influenza vaccine. This is recommended every year.  Tetanus, diphtheria, and acellular pertussis (Tdap, Td) vaccine. You may need a Td booster every 10 years.  Varicella vaccine. You may need this if you have not been vaccinated.  Zoster vaccine. You may need this after age 61.  Measles, mumps, and rubella (MMR) vaccine. You may need at least one dose of MMR if you were born in 1957 or later. You may also need a second dose.  Pneumococcal 13-valent conjugate (PCV13) vaccine. One dose is recommended after age 69.  Pneumococcal polysaccharide (PPSV23) vaccine. One dose is recommended  after age 3.  Meningococcal vaccine. You may need this if you have certain conditions.  Hepatitis A vaccine. You may need this if you have certain conditions or if you travel or work in places where you may be exposed to hepatitis A.  Hepatitis B vaccine. You may need this if you have certain conditions or if you travel or work in places where you may be  exposed to hepatitis B.  Haemophilus influenzae type b (Hib) vaccine. You may need this if you have certain risk factors. Talk to your health care provider about which screenings and vaccines you need and how often you need them. This information is not intended to replace advice given to you by your health care provider. Make sure you discuss any questions you have with your health care provider. Document Released: 10/03/2015 Document Revised: 05/26/2016 Document Reviewed: 07/08/2015 Elsevier Interactive Patient Education  2017 [employer].

## 2017-01-25 NOTE — Assessment & Plan Note (Signed)
Encouraged DASH diet, decrease po intake and increase exercise as tolerated. Needs 7-8 hours of sleep nightly. Avoid trans fats, eat small, frequent meals every 4-5 hours with lean proteins, complex carbs and healthy fats. Minimize simple carbs, consider bariatric referral

## 2017-01-25 NOTE — Assessment & Plan Note (Signed)
Patient encouraged to maintain heart healthy diet, regular exercise, adequate sleep. Consider daily probiotics. Take medications as prescribed 

## 2017-01-25 NOTE — Assessment & Plan Note (Signed)
Has tried muscle relaxers with no significant relief

## 2017-01-25 NOTE — Assessment & Plan Note (Signed)
Tolerating statin, encouraged heart healthy diet, avoid trans fats, minimize simple carbs and saturated fats. Increase exercise as tolerated 

## 2017-01-25 NOTE — Progress Notes (Signed)
Patient ID: DELIO SLATES, male   DOB: Nov 19, 1940, 76 y.o.   MRN: [id number]   Subjective:  I acted as a Education administrator for Penni Homans, [place], [place]   Patient ID: [name], male    DOB: 1941-04-07, 76 y.o.   MRN: [id number]  Chief Complaint  Patient presents with  . Annual Exam  . Anemia  . Gout    HPI  Patient is in today for an annual examination. Patient states that he is having ongoing neck and back pain. And that he has been experiencing a shooting pain from his left buttock; suspects it may be his sciatic nerve. Patient has a Hx of gout, GERD, edema, anemia, osteoarthritis, constipation, COPD mixed type. Patient has no additional acute concerns noted at this time. He denies any falls or trauma. His low back and left hip hurt bad enough to keep him up at night. Neck hurts bad enough to keep him from turning his head to the left. No radicular symptoms down arms. Denies CP/palp/SOB/HA/congestion/fevers/GI or GU c/o. Taking meds as prescribed  Patient Care Team: [name], MD as PCP - General (Family Medicine) Josue Hector, MD as Consulting Physician (Cardiology)   Past Medical History:  Diagnosis Date  . Anemia   . Aortic stenosis   . Aortic stenosis, severe    S/p Edwards Sapien 3 Transcatheter Heart Valve (size 26 mm, model # [id number], serial # [id number])  . Arthritis   . Bursitis   . Cataract    left immature  . Cellulitis 10/12/2015  . Complication of anesthesia    Halucinations  . Constipation    takes Miralax daily as well as Senokot daily  . DDD (degenerative disc disease)   . Gout    takes Allopurinol daily  . History of blood clots 1962   knee  . History of blood transfusion    no abnormal reaction noted  . History of shingles   . Hyperlipidemia    takes Atorvastatin daily  . Hypertension    takes Lisinopril daily  . Low back pain 01/25/2017  . Neck pain on left side 01/25/2017  . OSA (obstructive sleep apnea)   . Osteoarthritis   . Peripheral  edema    takes Furosemide daily  . Peripheral neuropathy    takes Gabapentin daily  . Peroneal palsy    significant right foot drop  . Pneumonia 25+yrs ago   hx of  . Urinary frequency   . Urinary urgency   . Valvular heart disease     Past Surgical History:  Procedure Laterality Date  . CARDIAC CATHETERIZATION N/A 05/02/2015   Procedure: Right/Left Heart Cath and Coronary Angiography;  Surgeon: Burnell Blanks, MD;  Location: [place] [facility];  Service: Cardiovascular;  Laterality: N/A;  . COLONOSCOPY N/A 02/07/2013   Procedure: COLONOSCOPY;  Surgeon: Juanita Craver, MD;  Location: [facility] ENDOSCOPY;  Service: Endoscopy;  Laterality: N/A;  . COLONOSCOPY N/A 02/09/2013   Procedure: COLONOSCOPY;  Surgeon: Beryle Beams, MD;  Location: [place];  Service: Endoscopy;  Laterality: N/A;  . ESOPHAGOGASTRODUODENOSCOPY N/A 02/07/2013   Procedure: ESOPHAGOGASTRODUODENOSCOPY (EGD);  Surgeon: Juanita Craver, MD;  Location: [facility] ENDOSCOPY;  Service: Endoscopy;  Laterality: N/A;  . GIVENS CAPSULE STUDY N/A 02/09/2013   Procedure: GIVENS CAPSULE STUDY;  Surgeon: Beryle Beams, MD;  Location: [place];  Service: Endoscopy;  Laterality: N/A;  . HERNIA REPAIR     umbilical hernia  . KNEE SURGERY Right    multiple  knee surgeries due to complication of R TKA  . LAMINOTOMY  8469   c6-t2  . SPINE SURGERY    . TEE WITHOUT CARDIOVERSION N/A 03/07/2015   Procedure: TRANSESOPHAGEAL ECHOCARDIOGRAM (TEE);  Surgeon: Josue Hector, MD;  Location: [facility] ENDOSCOPY;  Service: Cardiovascular;  Laterality: N/A;  ANES TO BRING PROPOFOL PER DOCTOR  . TEE WITHOUT CARDIOVERSION N/A 06/10/2015   Procedure: TRANSESOPHAGEAL ECHOCARDIOGRAM (TEE);  Surgeon: Burnell Blanks, MD;  Location: [place];  Service: Open Heart Surgery;  Laterality: N/A;  . TOTAL KNEE ARTHROPLASTY     right  . TRANSCATHETER AORTIC VALVE REPLACEMENT, TRANSFEMORAL Left 06/10/2015   Procedure: TRANSCATHETER AORTIC VALVE REPLACEMENT, TRANSFEMORAL;   Surgeon: Burnell Blanks, MD;  Location: [place];  Service: Open Heart Surgery;  Laterality: Left;    Family History  Problem Relation Age of Onset  . Arthritis Sister     "crippling"  . Arthritis Sister     Social History   Social History  . Marital status: Married    Spouse name: N/A  . Number of children: N/A  . Years of education: N/A   Occupational History  . retired Administrator    Social History Main Topics  . Smoking status: Former Smoker    Years: 30.00    Types: Pipe    Quit date: 09/20/2006  . Smokeless tobacco: Never Used  . Alcohol use No  . Drug use: No  . Sexual activity: No     Comment: lives with wife, retired, no dietary restrictions   Other Topics Concern  . Not on file   Social History Narrative  . No narrative on file    Outpatient Medications Prior to Visit  Medication Sig Dispense Refill  . acetaminophen (TYLENOL) 500 MG tablet Take 500 mg by mouth 2 (two) times daily.     [name] albuterol (PROVENTIL HFA;VENTOLIN HFA) 108 (90 Base) MCG/ACT inhaler Inhale 2 puffs into the lungs every 6 (six) hours as needed for wheezing or shortness of breath. 1 Inhaler 0  . allopurinol (ZYLOPRIM) 300 MG tablet TAKE 1 TABLET (300 MG TOTAL) BY MOUTH DAILY. 30 tablet 6  . aspirin 81 MG EC tablet TAKE 1 TABLET (81 MG TOTAL) BY MOUTH DAILY. 30 tablet 11  . atorvastatin (LIPITOR) 40 MG tablet TAKE 1 TABLET BY MOUTH EVERY DAY 90 tablet 2  . cholecalciferol (VITAMIN D) 1000 UNITS tablet Take 2,000 Units by mouth daily.     . clopidogrel (PLAVIX) 75 MG tablet Take 1 tablet (75 mg total) by mouth daily with breakfast. 90 tablet 3  . Coenzyme Q10 (COQ10) 200 MG CAPS Take 200 mg by mouth daily.    . furosemide (LASIX) 20 MG tablet TAKE 1 TABLET BY MOUTH TWICE A DAY 180 tablet 3  . gabapentin (NEURONTIN) 300 MG capsule TAKE 1 CAPSULE (300 MG TOTAL) BY MOUTH 2 (TWO) TIMES DAILY. 180 capsule 1  . hydrocortisone (ANUSOL-HC) 25 MG suppository Place 1 suppository (25 mg total)  rectally 2 (two) times daily. 12 suppository 0  . lisinopril (PRINIVIL,ZESTRIL) 10 MG tablet TAKE 1 TABLET BY MOUTH EVERY DAY 90 tablet 0  . loratadine (CLARITIN) 10 MG tablet Take 1 tablet (10 mg total) by mouth daily. 30 tablet 0  . Multiple Vitamin (MULTIVITAMIN WITH MINERALS) TABS Take 1 tablet by mouth daily.    . polyethylene glycol (MIRALAX / GLYCOLAX) packet Take 17 g by mouth daily.    . sennosides-docusate sodium (SENOKOT-S) 8.6-50 MG tablet Take 1 tablet by mouth  2 (two) times daily.    . traMADol (ULTRAM) 50 MG tablet TAKE 1 TABLET EVERY 6 HOURS AS NEEDED 90 tablet 0  . cyclobenzaprine (FLEXERIL) 5 MG tablet Take 1 tablet (5 mg total) by mouth at bedtime. 7 tablet 0  . predniSONE (DELTASONE) 20 MG tablet Take 2 tablets (40 mg total) by mouth daily with breakfast. 6 tablet 0  . atorvastatin (LIPITOR) 40 MG tablet Take 40 mg by mouth daily.    . furosemide (LASIX) 20 MG tablet Take 1 tablet (20 mg total) by mouth 3 (three) times daily. (Patient not taking: Reported on 01/25/2017) 270 tablet 3  . Menthol-Methyl Salicylate (MUSCLE RUB) 10-15 % CREA Apply 1 application topically as directed.     . methylphenidate (RITALIN) 10 MG tablet Take 1 tablet (10 mg total) by mouth 2 (two) times daily. (Patient not taking: Reported on 01/25/2017) 60 tablet 0   No facility-administered medications prior to visit.     Allergies  Allergen Reactions  . Coumadin [Warfarin Sodium] Other (See Comments)    States he can't be on this-bleeds out    Review of Systems  Constitutional: Negative for fever.  HENT: Negative for congestion.   Eyes: Negative for blurred vision.  Respiratory: Negative for cough and shortness of breath.   Cardiovascular: Negative for chest pain, palpitations and leg swelling.  Gastrointestinal: Negative for vomiting.  Musculoskeletal: Positive for back pain and neck pain.       L buttock.  Skin: Negative for rash.  Neurological: Negative for loss of consciousness and  headaches.       Objective:    Physical Exam  Constitutional: He is oriented to person, place, and time. He appears well-developed and well-nourished. No distress.  HENT:  Head: Normocephalic and atraumatic.  Eyes: Conjunctivae are normal.  Neck: Normal range of motion. No thyromegaly present.  Cardiovascular: Normal rate and regular rhythm.   Pulmonary/Chest: Effort normal and breath sounds normal. He has no wheezes.  Abdominal: Soft. Bowel sounds are normal. There is no tenderness.  Musculoskeletal: He exhibits no edema or deformity.  Neurological: He is alert and oriented to person, place, and time.  Skin: Skin is warm and dry. He is not diaphoretic.  Psychiatric: He has a normal mood and affect.    BP 127/70 (BP Location: Right Wrist, Patient Position: Sitting, Cuff Size: Normal)   Pulse 65   Temp 98.4 F (36.9 C) (Oral)   Ht 6\' 2"  (1.88 m)   Wt (!) 400 lb (181.4 kg)   SpO2 96% Comment: RA  BMI 51.36 kg/m  Wt Readings from Last 3 Encounters:  01/25/17 (!) 400 lb (181.4 kg)  11/11/16 (!) 385 lb (174.6 kg)  11/02/16 (!) 385 lb (174.6 kg)   BP Readings from Last 3 Encounters:  01/25/17 127/70  01/19/17 128/65  12/28/16 112/62     Immunization History  Administered Date(s) Administered  . Influenza Split 07/08/2011, 06/20/2012  . Influenza, High Dose Seasonal PF 06/22/2016  . Influenza,inj,Quad PF,36+ Mos 06/07/2013, 07/29/2014, 07/18/2015  . PPD Test 02/16/2013  . Pneumococcal Conjugate-13 10/11/2013  . Pneumococcal Polysaccharide-23 11/07/2015  . Td 08/26/2006  . Zoster 07/27/2013    Health Maintenance  Topic Date Due  . TETANUS/TDAP  04/27/2017 (Originally 08/26/2016)  . INFLUENZA VACCINE  04/20/2017  . COLONOSCOPY  02/10/2023  . PNA vac Low Risk Adult  Completed    Lab Results  Component Value Date   WBC 8.2 07/29/2016   HGB 11.9 (L) 07/29/2016   HCT  36.3 (L) 07/29/2016   PLT 152.0 07/29/2016   GLUCOSE 93 07/29/2016   CHOL 143 07/29/2016   TRIG  197.0 (H) 07/29/2016   HDL 28.50 (L) 07/29/2016   LDLCALC 75 07/29/2016   ALT 20 07/29/2016   AST 19 07/29/2016   NA 138 07/29/2016   K 5.4 (H) 07/29/2016   CL 101 07/29/2016   CREATININE 1.23 07/29/2016   BUN 34 (H) 07/29/2016   CO2 31 07/29/2016   TSH 1.37 07/29/2016   INR 1.32 06/10/2015   HGBA1C 5.7 (H) 06/06/2015    Lab Results  Component Value Date   TSH 1.37 07/29/2016   Lab Results  Component Value Date   WBC 8.2 07/29/2016   HGB 11.9 (L) 07/29/2016   HCT 36.3 (L) 07/29/2016   MCV 93.7 07/29/2016   PLT 152.0 07/29/2016   Lab Results  Component Value Date   NA 138 07/29/2016   K 5.4 (H) 07/29/2016   CO2 31 07/29/2016   GLUCOSE 93 07/29/2016   BUN 34 (H) 07/29/2016   CREATININE 1.23 07/29/2016   BILITOT 0.4 07/29/2016   ALKPHOS 80 07/29/2016   AST 19 07/29/2016   ALT 20 07/29/2016   PROT 6.9 07/29/2016   ALBUMIN 3.9 07/29/2016   CALCIUM 9.6 07/29/2016   ANIONGAP 3 (L) 06/13/2015   GFR 61.14 07/29/2016   Lab Results  Component Value Date   CHOL 143 07/29/2016   Lab Results  Component Value Date   HDL 28.50 (L) 07/29/2016   Lab Results  Component Value Date   LDLCALC 75 07/29/2016   Lab Results  Component Value Date   TRIG 197.0 (H) 07/29/2016   Lab Results  Component Value Date   CHOLHDL 5 07/29/2016   Lab Results  Component Value Date   HGBA1C 5.7 (H) 06/06/2015         Assessment & Plan:   Problem List Items Addressed This Visit    HYPERCHOLESTEROLEMIA    Tolerating statin, encouraged heart healthy diet, avoid trans fats, minimize simple carbs and saturated fats. Increase exercise as tolerated      Relevant Orders   Lipid panel   Gout    No recent flares, check uric acid level      Relevant Orders   Uric acid   OBESITY, MORBID    Encouraged DASH diet, decrease po intake and increase exercise as tolerated. Needs 7-8 hours of sleep nightly. Avoid trans fats, eat small, frequent meals every 4-5 hours with lean proteins,  complex carbs and healthy fats. Minimize simple carbs, consider bariatric referral      Anemia    Increase leafy greens, consider increased lean red meat and using cast iron cookware. Continue to monitor, report any concerns      Relevant Orders   CBC   Obstructive sleep apnea    New mask is irritating his skin on the right temple. Is using and continued to keep using but to coer skin with antibiotic ointment and bandaid      Thrombocytopenia, unspecified (HCC)    Asymptomatic, mild, will check cbc      Preventative health care - Primary    Patient encouraged to maintain heart healthy diet, regular exercise, adequate sleep. Consider daily probiotics. Take medications as prescribed      Relevant Orders   CBC   Comprehensive metabolic panel   TSH   Neck pain on left side    Has tried muscle relaxers with no significant relief      Low  back pain    His neurosurgeon, Dr Rita Ohara is seeing him on 02/08/17. His pain is keeping him up at night. He is too large to get into MRI machines. Encouraged moist heat and gentle stretching as tolerated. May try NSAIDs and prescription meds as directed and report if symptoms worsen or seek immediate care. Apply moist heat and lidocaine patches      Relevant Medications   tiZANidine (ZANAFLEX) 4 MG tablet   predniSONE (DELTASONE) 20 MG tablet    Other Visit Diagnoses    Viral URI       Relevant Medications   predniSONE (DELTASONE) 20 MG tablet   Chest tightness       Relevant Medications   predniSONE (DELTASONE) 20 MG tablet      I have discontinued Mr. Koc's MUSCLE RUB, methylphenidate, and cyclobenzaprine. I am also having him start on tiZANidine. Additionally, I am having him maintain his cholecalciferol, sennosides-docusate sodium, multivitamin with minerals, loratadine, acetaminophen, CoQ10, polyethylene glycol, hydrocortisone, clopidogrel, aspirin, gabapentin, albuterol, furosemide, traMADol, allopurinol, atorvastatin, lisinopril,  and predniSONE.  Meds ordered this encounter  Medications  . tiZANidine (ZANAFLEX) 4 MG tablet    Sig: Take 1 tablet (4 mg total) by mouth every 6 (six) hours as needed for muscle spasms.    Dispense:  30 tablet    Refill:  1  . predniSONE (DELTASONE) 20 MG tablet    Sig: Take 2 tablets (40 mg total) by mouth daily with breakfast.    Dispense:  10 tablet    Refill:  0    CMA served as scribe during this visit. History, Physical and Plan performed by medical provider. Documentation and orders reviewed and attested to.  Penni Homans, MD

## 2017-01-25 NOTE — Assessment & Plan Note (Signed)
Asymptomatic, mild, will check cbc

## 2017-02-04 ENCOUNTER — Encounter (HOSPITAL_COMMUNITY): Payer: Self-pay | Admitting: Emergency Medicine

## 2017-02-04 ENCOUNTER — Emergency Department (HOSPITAL_COMMUNITY)
Admission: EM | Admit: 2017-02-04 | Discharge: 2017-02-04 | Disposition: A | Discharge status: Home / Self Care | Payer: PPO | Attending: Emergency Medicine | Admitting: Emergency Medicine

## 2017-02-04 DIAGNOSIS — I10 Essential (primary) hypertension: Secondary | ICD-10-CM | POA: Insufficient documentation

## 2017-02-04 DIAGNOSIS — R531 Weakness: Secondary | ICD-10-CM | POA: Diagnosis not present

## 2017-02-04 DIAGNOSIS — Z87891 Personal history of nicotine dependence: Secondary | ICD-10-CM | POA: Diagnosis not present

## 2017-02-04 DIAGNOSIS — M792 Neuralgia and neuritis, unspecified: Secondary | ICD-10-CM | POA: Diagnosis not present

## 2017-02-04 DIAGNOSIS — Z7982 Long term (current) use of aspirin: Secondary | ICD-10-CM | POA: Insufficient documentation

## 2017-02-04 DIAGNOSIS — J449 Chronic obstructive pulmonary disease, unspecified: Secondary | ICD-10-CM | POA: Diagnosis not present

## 2017-02-04 DIAGNOSIS — Z79899 Other long term (current) drug therapy: Secondary | ICD-10-CM | POA: Insufficient documentation

## 2017-02-04 DIAGNOSIS — M79606 Pain in leg, unspecified: Secondary | ICD-10-CM | POA: Diagnosis not present

## 2017-02-04 DIAGNOSIS — Z96651 Presence of right artificial knee joint: Secondary | ICD-10-CM | POA: Insufficient documentation

## 2017-02-04 DIAGNOSIS — G9519 Other vascular myelopathies: Secondary | ICD-10-CM

## 2017-02-04 DIAGNOSIS — M545 Low back pain: Secondary | ICD-10-CM | POA: Diagnosis not present

## 2017-02-04 DIAGNOSIS — R609 Edema, unspecified: Secondary | ICD-10-CM | POA: Diagnosis not present

## 2017-02-04 DIAGNOSIS — M5416 Radiculopathy, lumbar region: Secondary | ICD-10-CM | POA: Diagnosis not present

## 2017-02-04 DIAGNOSIS — M48062 Spinal stenosis, lumbar region with neurogenic claudication: Secondary | ICD-10-CM | POA: Diagnosis not present

## 2017-02-04 DIAGNOSIS — M549 Dorsalgia, unspecified: Secondary | ICD-10-CM | POA: Diagnosis not present

## 2017-02-04 DIAGNOSIS — R404 Transient alteration of awareness: Secondary | ICD-10-CM | POA: Diagnosis not present

## 2017-02-04 LAB — CBC WITH DIFFERENTIAL/PLATELET
Basophils Absolute: 0 10*3/uL (ref 0.0–0.1)
Basophils Relative: 0 %
Eosinophils Absolute: 0 10*3/uL (ref 0.0–0.7)
Eosinophils Relative: 0 %
HCT: 36.6 % — ABNORMAL LOW (ref 39.0–52.0)
Hemoglobin: 11.7 g/dL — ABNORMAL LOW (ref 13.0–17.0)
Lymphocytes Relative: 5 %
Lymphs Abs: 0.8 10*3/uL (ref 0.7–4.0)
MCH: 30.5 pg (ref 26.0–34.0)
MCHC: 32 g/dL (ref 30.0–36.0)
MCV: 95.6 fL (ref 78.0–100.0)
Monocytes Absolute: 1 10*3/uL (ref 0.1–1.0)
Monocytes Relative: 7 %
Neutro Abs: 13.4 10*3/uL — ABNORMAL HIGH (ref 1.7–7.7)
Neutrophils Relative %: 88 %
Platelets: 155 10*3/uL (ref 150–400)
RBC: 3.83 MIL/uL — ABNORMAL LOW (ref 4.22–5.81)
RDW: 15.9 % — ABNORMAL HIGH (ref 11.5–15.5)
WBC: 15.3 10*3/uL — ABNORMAL HIGH (ref 4.0–10.5)

## 2017-02-04 LAB — BASIC METABOLIC PANEL
Anion gap: 6 (ref 5–15)
BUN: 39 mg/dL — ABNORMAL HIGH (ref 6–20)
CO2: 29 mmol/L (ref 22–32)
Calcium: 8.9 mg/dL (ref 8.9–10.3)
Chloride: 100 mmol/L — ABNORMAL LOW (ref 101–111)
Creatinine, Ser: 1.64 mg/dL — ABNORMAL HIGH (ref 0.61–1.24)
GFR calc Af Amer: 46 mL/min — ABNORMAL LOW (ref >60)
GFR calc non Af Amer: 40 mL/min — ABNORMAL LOW (ref >60)
Glucose, Bld: 115 mg/dL — ABNORMAL HIGH (ref 65–99)
Potassium: 5.7 mmol/L — ABNORMAL HIGH (ref 3.5–5.1)
Sodium: 135 mmol/L (ref 135–145)

## 2017-02-04 MED ORDER — ONDANSETRON HCL 4 MG/2ML IJ SOLN
4.0000 mg | Freq: Once | INTRAMUSCULAR | Status: AC
  Administered 2017-02-04: 4 mg via INTRAVENOUS
  Filled 2017-02-04: qty 2

## 2017-02-04 MED ORDER — OXYCODONE-ACETAMINOPHEN 5-325 MG PO TABS
2.0000 | ORAL_TABLET | Freq: Once | ORAL | Status: AC
  Administered 2017-02-04: 2 via ORAL
  Filled 2017-02-04: qty 2

## 2017-02-04 MED ORDER — HYDROMORPHONE HCL 1 MG/ML IJ SOLN
1.0000 mg | INTRAMUSCULAR | Status: DC | PRN
  Administered 2017-02-04: 1 mg via INTRAVENOUS
  Filled 2017-02-04: qty 1

## 2017-02-04 MED ORDER — OXYCODONE-ACETAMINOPHEN 5-325 MG PO TABS: 2.0000 | ORAL_TABLET | ORAL | 0 refills | Status: DC | PRN

## 2017-02-04 MED ORDER — SODIUM CHLORIDE 0.9 % IV BOLUS (SEPSIS)
1000.0000 mL | Freq: Once | INTRAVENOUS | Status: AC
  Administered 2017-02-04: 1000 mL via INTRAVENOUS

## 2017-02-04 NOTE — Discharge Instructions (Signed)
Dr. Sherwood Gambler at [facility] Tuesday.  See Dr. Randel Pigg for follow up evaluation of your legs to discuss possible vascular testing.

## 2017-02-04 NOTE — ED Triage Notes (Signed)
To ED via [facility] from home with c/o increasing weakness over past 3 weeks-- has hx of low back surgery-- states that has burning pain in low back that is causing the weakness. Pt is alert/oriented x 4, w/d-- has swollen legs bilaterally-- discolored-- black spot on right great toe.

## 2017-02-04 NOTE — ED Notes (Signed)
Dr. Jeneen Rinks reports no longer need the [name] ABI. Vascular made aware and order d/c.

## 2017-02-04 NOTE — ED Notes (Signed)
Pt will need to be transferred via pTAR to home. Unable to get to W/C and into house--

## 2017-02-04 NOTE — ED Notes (Signed)
Pt's O2 sats dropping after Dilaudid -- O2 placed on at 2L/M/[place] -- pt wears O2 at 1L at home.

## 2017-02-04 NOTE — ED Provider Notes (Signed)
[facility] DEPT Provider Note   CSN: [id number] Arrival date & time: 02/04/17  1113     History   Chief Complaint Chief Complaint  Patient presents with  . Fatigue  . Back Pain    HPI BOBBI KOZAKIEWICZ is a 76 y.o. male. Complaint is back pain, and leg pain.  HPI:  76 year old male with history of previous lumbar decompressive laminectomy with Dr. Lucia Gaskins many years ago. He has developed back pain again over the last several weeks. When he walks he gets pain that radiates down his leg after several steps that begins drag. He describes a fairly classic foot drop. He does not have right lower extremity claudication symptoms.  He has no change in bowel or bladder habits. He has normal sensation fetuses symmetric neuropathy. He has had no fall or injury or trauma to his back.  Patient has history of aortic stenosis and underwent T aVR.  Past medical history also includes anemia, peripheral neuropathy, frequent cellulitis, sleep apnea, chronic neck pain status post neck surgery, osteophyte arthritis, hyperlipidemia, hypertension.  Past Medical History:  Diagnosis Date  . Anemia   . Aortic stenosis   . Aortic stenosis, severe    S/p Edwards Sapien 3 Transcatheter Heart Valve (size 26 mm, model # [id number], serial # [id number])  . Arthritis   . Bursitis   . Cataract    left immature  . Cellulitis 10/12/2015  . Complication of anesthesia    Halucinations  . Constipation    takes Miralax daily as well as Senokot daily  . DDD (degenerative disc disease)   . Gout    takes Allopurinol daily  . History of blood clots 1962   knee  . History of blood transfusion    no abnormal reaction noted  . History of shingles   . Hyperlipidemia    takes Atorvastatin daily  . Hypertension    takes Lisinopril daily  . Low back pain 01/25/2017  . Neck pain on left side 01/25/2017  . OSA (obstructive sleep apnea)   . Osteoarthritis   . Peripheral edema    takes Furosemide daily  . Peripheral  neuropathy    takes Gabapentin daily  . Peroneal palsy    significant right foot drop  . Pneumonia 25+yrs ago   hx of  . Urinary frequency   . Urinary urgency   . Valvular heart disease     Patient Active Problem List   Diagnosis Date Noted  . Neck pain on left side 01/25/2017  . Low back pain 01/25/2017  . Constipation 08/15/2015  . Severe aortic valve stenosis   . Medicare annual wellness visit, subsequent 03/09/2015  . Preventative health care 03/09/2015  . Allergic rhinitis 12/02/2014  . COPD mixed type ([place]) 12/02/2014  . Chronic venous insufficiency 10/02/2013  . Venous stasis dermatitis 10/02/2013  . AVM (arteriovenous malformation) of colon with hemorrhage 02/10/2013  . Thrombocytopenia, unspecified ([place]) 02/09/2013  . EDEMA 04/15/2010  . HYPERCHOLESTEROLEMIA 04/14/2010  . Gout 04/14/2010  . Anemia 04/14/2010  . Aortic valve disorder 04/14/2010  . VALVULAR HEART DISEASE 04/14/2010  . OSTEOARTHRITIS 04/14/2010  . Multilevel degenerative disc disease 04/14/2010  . BURSITIS 04/14/2010  . OBESITY, MORBID 03/21/2010  . Obstructive sleep apnea 02/19/2008    Past Surgical History:  Procedure Laterality Date  . CARDIAC CATHETERIZATION N/A 05/02/2015   Procedure: Right/Left Heart Cath and Coronary Angiography;  Surgeon: Burnell Blanks, MD;  Location: [facility];  Service: Cardiovascular;  Laterality: N/A;  .  COLONOSCOPY N/A 02/07/2013   Procedure: COLONOSCOPY;  Surgeon: Juanita Craver, MD;  Location: [facility] ENDOSCOPY;  Service: Endoscopy;  Laterality: N/A;  . COLONOSCOPY N/A 02/09/2013   Procedure: COLONOSCOPY;  Surgeon: Beryle Beams, MD;  Location: [facility];  Service: Endoscopy;  Laterality: N/A;  . ESOPHAGOGASTRODUODENOSCOPY N/A 02/07/2013   Procedure: ESOPHAGOGASTRODUODENOSCOPY (EGD);  Surgeon: Juanita Craver, MD;  Location: [facility] ENDOSCOPY;  Service: Endoscopy;  Laterality: N/A;  . GIVENS CAPSULE STUDY N/A 02/09/2013   Procedure: GIVENS CAPSULE STUDY;  Surgeon:  Beryle Beams, MD;  Location: [facility];  Service: Endoscopy;  Laterality: N/A;  . HERNIA REPAIR     umbilical hernia  . KNEE SURGERY Right    multiple knee surgeries due to complication of R TKA  . LAMINOTOMY  0962   c6-t2  . SPINE SURGERY    . TEE WITHOUT CARDIOVERSION N/A 03/07/2015   Procedure: TRANSESOPHAGEAL ECHOCARDIOGRAM (TEE);  Surgeon: Josue Hector, MD;  Location: [facility] ENDOSCOPY;  Service: Cardiovascular;  Laterality: N/A;  ANES TO BRING PROPOFOL PER DOCTOR  . TEE WITHOUT CARDIOVERSION N/A 06/10/2015   Procedure: TRANSESOPHAGEAL ECHOCARDIOGRAM (TEE);  Surgeon: Burnell Blanks, MD;  Location: [place];  Service: Open Heart Surgery;  Laterality: N/A;  . TOTAL KNEE ARTHROPLASTY     right  . TRANSCATHETER AORTIC VALVE REPLACEMENT, TRANSFEMORAL Left 06/10/2015   Procedure: TRANSCATHETER AORTIC VALVE REPLACEMENT, TRANSFEMORAL;  Surgeon: Burnell Blanks, MD;  Location: [facility];  Service: Open Heart Surgery;  Laterality: Left;       Home Medications    Prior to Admission medications   Medication Sig Start Date End Date Taking? Authorizing Provider  acetaminophen (TYLENOL) 500 MG tablet Take 500 mg by mouth 2 (two) times daily.     [provider]  albuterol (PROVENTIL HFA;VENTOLIN HFA) 108 (90 Base) MCG/ACT inhaler Inhale 2 puffs into the lungs every 6 (six) hours as needed for wheezing or shortness of breath. 09/29/16   Brunetta Jeans, PA-C  allopurinol (ZYLOPRIM) 300 MG tablet TAKE 1 TABLET (300 MG TOTAL) BY MOUTH DAILY. 12/06/16   [name], MD  aspirin 81 MG EC tablet TAKE 1 TABLET (81 MG TOTAL) BY MOUTH DAILY. 06/01/16   Josue Hector, MD  atorvastatin (LIPITOR) 40 MG tablet TAKE 1 TABLET BY MOUTH EVERY DAY 01/10/17   [name], MD  cholecalciferol (VITAMIN D) 1000 UNITS tablet Take 2,000 Units by mouth daily.     [provider]  clopidogrel (PLAVIX) 75 MG tablet Take 1 tablet (75 mg total) by mouth daily with breakfast. 04/23/16   Josue Hector, MD  Coenzyme Q10 (COQ10) 200 MG CAPS Take 200 mg by mouth daily.    [provider]  furosemide (LASIX) 20 MG tablet TAKE 1 TABLET BY MOUTH TWICE A DAY 10/01/16   Josue Hector, MD  gabapentin (NEURONTIN) 300 MG capsule TAKE 1 CAPSULE (300 MG TOTAL) BY MOUTH 2 (TWO) TIMES DAILY. 08/17/16   [name], MD  hydrocortisone (ANUSOL-HC) 25 MG suppository Place 1 suppository (25 mg total) rectally 2 (two) times daily. 10/02/15   [name], MD  lisinopril (PRINIVIL,ZESTRIL) 10 MG tablet TAKE 1 TABLET BY MOUTH EVERY DAY 01/17/17   [name], MD  loratadine (CLARITIN) 10 MG tablet Take 1 tablet (10 mg total) by mouth daily. 12/02/14   Saguier, Percell Miller, PA-C  Multiple Vitamin (MULTIVITAMIN WITH MINERALS) TABS Take 1 tablet by mouth daily.    [provider]  oxyCODONE-acetaminophen (PERCOCET/ROXICET) 5-325  MG tablet Take 2 tablets by mouth every 4 (four) hours as needed. 02/04/17   Tanna Furry, MD  polyethylene glycol [facility] / Floria Raveling) packet Take 17 g by mouth daily.    [provider]  predniSONE (DELTASONE) 20 MG tablet Take 2 tablets (40 mg total) by mouth daily with breakfast. 01/25/17   [name], MD  sennosides-docusate sodium (SENOKOT-S) 8.6-50 MG tablet Take 1 tablet by mouth 2 (two) times daily.    [provider]  tiZANidine (ZANAFLEX) 4 MG tablet Take 1 tablet (4 mg total) by mouth every 6 (six) hours as needed for muscle spasms. 01/25/17   [name], MD  traMADol Veatrice Bourbon) 50 MG tablet TAKE 1 TABLET EVERY 6 HOURS AS NEEDED 12/03/16   [name], MD    Family History Family History  Problem Relation Age of Onset  . Arthritis Sister        "crippling"  . Arthritis Sister     Social History Social History  Substance Use Topics  . Smoking status: Former Smoker    Years: 30.00    Types: Pipe    Quit date: 09/20/2006  . Smokeless tobacco: Never Used  . Alcohol use No     Allergies   Coumadin [warfarin  sodium]   Review of Systems Review of Systems  Constitutional: Negative for appetite change, chills, diaphoresis, fatigue and fever.  HENT: Negative for mouth sores, sore throat and trouble swallowing.   Eyes: Negative for visual disturbance.  Respiratory: Negative for cough, chest tightness, shortness of breath and wheezing.   Cardiovascular: Negative for chest pain.  Gastrointestinal: Negative for abdominal distention, abdominal pain, diarrhea, nausea and vomiting.  Endocrine: Negative for polydipsia, polyphagia and polyuria.  Genitourinary: Negative for dysuria, frequency and hematuria.  Musculoskeletal: Positive for back pain and gait problem.  Skin: Negative for color change, pallor and rash.  Neurological: Positive for weakness. Negative for dizziness, syncope, light-headedness and headaches.  Hematological: Does not bruise/bleed easily.  Psychiatric/Behavioral: Negative for behavioral problems and confusion.     Physical Exam Updated Vital Signs BP 95/70   Pulse 89   Temp 98.8 F (37.1 C) (Oral)   Resp (!) 26   Ht 6\' 2"  (1.88 m)   Wt (!) 400 lb (181.4 kg)   SpO2 95%   BMI 51.36 kg/m   Physical Exam  Constitutional: He is oriented to person, place, and time. He appears well-developed and well-nourished. No distress.  Morbid obesity. Awake and alert. Pleasant.  HENT:  Head: Normocephalic.  Eyes: Conjunctivae are normal. Pupils are equal, round, and reactive to light. No scleral icterus.  Neck: Normal range of motion. Neck supple. No thyromegaly present.  Cardiovascular: Normal rate and regular rhythm.  Exam reveals no gallop and no friction rub.   No murmur heard. Pulmonary/Chest: Effort normal and breath sounds normal. No respiratory distress. He has no wheezes. He has no rales.  Abdominal: Soft. Bowel sounds are normal. He exhibits no distension. There is no tenderness. There is no rebound.  Musculoskeletal: Normal range of motion.  Neurological: He is alert and  oriented to person, place, and time.  Normal symmetric Strength to shoulder shrug, triceps, biceps, grip,wrist flex/extend,and intrinsics  Norma lsymmetric sensation above and below clavicles, and to all distributions to UEs. Norma symmetric strength to flex/.extend hip and knees, dorsi/plantar flex ankles. Normal symmetric sensation to all distributions to LEs Patellar and achilles reflexes 1-2+. Downgoing Babinski   Skin: Skin is warm and dry. No rash noted.  Skin of the feet is recommended. Toes are diffusely violaceous. He has hemorrhagic bullae on his right great, second, and third toe pain he states this is from an injury. He has palpable DP and PT pulses. These are phasic to triphasic with Doppler has immediate capillary refill.  Psychiatric: He has a normal mood and affect. His behavior is normal.     ED Treatments / Results  Labs (all labs ordered are listed, but only abnormal results are displayed) Labs Reviewed  CBC WITH DIFFERENTIAL/PLATELET - Abnormal; Notable for the following:       Result Value   WBC 15.3 (*)    RBC 3.83 (*)    Hemoglobin 11.7 (*)    HCT 36.6 (*)    RDW 15.9 (*)    Neutro Abs 13.4 (*)    All other components within normal limits  BASIC METABOLIC PANEL - Abnormal; Notable for the following:    Potassium 5.7 (*)    Chloride 100 (*)    Glucose, Bld 115 (*)    BUN 39 (*)    Creatinine, Ser 1.64 (*)    GFR calc non Af Amer 40 (*)    GFR calc Af Amer 46 (*)    All other components within normal limits    EKG  EKG Interpretation None       Radiology No results found.  Procedures Procedures (including critical care time)  Medications Ordered in ED Medications  HYDROmorphone (DILAUDID) injection 1 mg (1 mg Intravenous Given 02/04/17 1247)  oxyCODONE-acetaminophen (PERCOCET/ROXICET) 5-325 MG per tablet 2 tablet (not administered)  sodium chloride 0.9 % bolus 1,000 mL (1,000 mLs Intravenous New Bag/Given 02/04/17 1236)  ondansetron (ZOFRAN)  injection 4 mg (4 mg Intravenous Given 02/04/17 1247)     Initial Impression / Assessment and Plan / ED Course  I have reviewed the triage vital signs and the nursing notes.  Pertinent labs & imaging results that were available during my care of the patient were reviewed by me and considered in my medical decision making (see chart for details).   patient's symptoms are classically neurogenic claudication. He has signs of peripheral atrophy is most likely venous insufficiency. Doppler pulse and capillary refill exam and do not feel this represents vascular claudication. He is too large and too heavy for MRI here. He has undergone MRI at [facility]. I called and attempted to obtain a MRI time for him. He has an appointment with his neurosurgeon on Tuesday. They were unable to accept a referral from self. They contacted Dr. Pollie Friar office. He was unable to provide this as he has not seen the patient with any proximity. Patient is given pain medication here. He is discharged home. Avoid activity that flares his symptoms. See his neurosurgeon on Tuesday.   I last him to discuss the appearance of his feet with his primary care physician patient may need more formal vascular evaluation at that time.  Final Clinical Impressions(s) / ED Diagnoses   Final diagnoses:  Lumbar radiculopathy  Neurogenic claudication    New Prescriptions New Prescriptions   OXYCODONE-ACETAMINOPHEN (PERCOCET/ROXICET) 5-325 MG TABLET    Take 2 tablets by mouth every 4 (four) hours as needed.     Tanna Furry, MD 02/04/17 [phone]

## 2017-02-08 DIAGNOSIS — M549 Dorsalgia, unspecified: Secondary | ICD-10-CM | POA: Diagnosis not present

## 2017-02-08 DIAGNOSIS — Z9889 Other specified postprocedural states: Secondary | ICD-10-CM | POA: Diagnosis not present

## 2017-02-08 DIAGNOSIS — M47816 Spondylosis without myelopathy or radiculopathy, lumbar region: Secondary | ICD-10-CM | POA: Diagnosis not present

## 2017-02-08 DIAGNOSIS — M5136 Other intervertebral disc degeneration, lumbar region: Secondary | ICD-10-CM | POA: Diagnosis not present

## 2017-02-08 DIAGNOSIS — M48062 Spinal stenosis, lumbar region with neurogenic claudication: Secondary | ICD-10-CM | POA: Diagnosis not present

## 2017-02-10 ENCOUNTER — Other Ambulatory Visit: Payer: Self-pay | Admitting: Family Medicine

## 2017-02-13 DIAGNOSIS — R0602 Shortness of breath: Secondary | ICD-10-CM | POA: Diagnosis not present

## 2017-02-13 DIAGNOSIS — G4733 Obstructive sleep apnea (adult) (pediatric): Secondary | ICD-10-CM | POA: Diagnosis not present

## 2017-02-19 DIAGNOSIS — G4733 Obstructive sleep apnea (adult) (pediatric): Secondary | ICD-10-CM | POA: Diagnosis not present

## 2017-02-24 DIAGNOSIS — M47816 Spondylosis without myelopathy or radiculopathy, lumbar region: Secondary | ICD-10-CM | POA: Diagnosis not present

## 2017-02-24 DIAGNOSIS — Z981 Arthrodesis status: Secondary | ICD-10-CM | POA: Diagnosis not present

## 2017-02-24 DIAGNOSIS — M47815 Spondylosis without myelopathy or radiculopathy, thoracolumbar region: Secondary | ICD-10-CM | POA: Diagnosis not present

## 2017-03-02 DIAGNOSIS — M48062 Spinal stenosis, lumbar region with neurogenic claudication: Secondary | ICD-10-CM | POA: Diagnosis not present

## 2017-03-02 DIAGNOSIS — M47816 Spondylosis without myelopathy or radiculopathy, lumbar region: Secondary | ICD-10-CM | POA: Diagnosis not present

## 2017-03-02 DIAGNOSIS — Z9889 Other specified postprocedural states: Secondary | ICD-10-CM | POA: Diagnosis not present

## 2017-03-02 DIAGNOSIS — M5136 Other intervertebral disc degeneration, lumbar region: Secondary | ICD-10-CM | POA: Diagnosis not present

## 2017-03-06 ENCOUNTER — Other Ambulatory Visit: Payer: Self-pay | Admitting: Family Medicine

## 2017-03-07 NOTE — Telephone Encounter (Signed)
Faxed hardcopy for Tramadol to [facility] [place].

## 2017-03-07 NOTE — Telephone Encounter (Signed)
Requesting:   tramadol Contract    3/202018 UDS     Low risk next is due on 06/09/2017 Last OV    01/25/2017---future appt is on 07/26/2017 Last Refill   #90   12/03/2016  Please Advise

## 2017-03-09 DIAGNOSIS — M549 Dorsalgia, unspecified: Secondary | ICD-10-CM | POA: Diagnosis not present

## 2017-03-09 DIAGNOSIS — M47816 Spondylosis without myelopathy or radiculopathy, lumbar region: Secondary | ICD-10-CM | POA: Diagnosis not present

## 2017-03-09 DIAGNOSIS — M5136 Other intervertebral disc degeneration, lumbar region: Secondary | ICD-10-CM | POA: Diagnosis not present

## 2017-03-16 DIAGNOSIS — G4733 Obstructive sleep apnea (adult) (pediatric): Secondary | ICD-10-CM | POA: Diagnosis not present

## 2017-03-16 DIAGNOSIS — R0602 Shortness of breath: Secondary | ICD-10-CM | POA: Diagnosis not present

## 2017-03-21 DIAGNOSIS — G4733 Obstructive sleep apnea (adult) (pediatric): Secondary | ICD-10-CM | POA: Diagnosis not present

## 2017-03-22 ENCOUNTER — Other Ambulatory Visit: Payer: Self-pay | Admitting: Family Medicine

## 2017-04-03 DIAGNOSIS — G4733 Obstructive sleep apnea (adult) (pediatric): Secondary | ICD-10-CM | POA: Diagnosis not present

## 2017-04-03 DIAGNOSIS — R0602 Shortness of breath: Secondary | ICD-10-CM | POA: Diagnosis not present

## 2017-04-14 ENCOUNTER — Other Ambulatory Visit: Payer: Self-pay | Admitting: Cardiovascular Disease

## 2017-04-14 DIAGNOSIS — G4733 Obstructive sleep apnea (adult) (pediatric): Secondary | ICD-10-CM | POA: Diagnosis not present

## 2017-04-15 DIAGNOSIS — R0602 Shortness of breath: Secondary | ICD-10-CM | POA: Diagnosis not present

## 2017-04-15 DIAGNOSIS — G4733 Obstructive sleep apnea (adult) (pediatric): Secondary | ICD-10-CM | POA: Diagnosis not present

## 2017-04-18 ENCOUNTER — Other Ambulatory Visit: Payer: Self-pay | Admitting: Family Medicine

## 2017-04-21 DIAGNOSIS — G4733 Obstructive sleep apnea (adult) (pediatric): Secondary | ICD-10-CM | POA: Diagnosis not present

## 2017-04-22 DIAGNOSIS — M48062 Spinal stenosis, lumbar region with neurogenic claudication: Secondary | ICD-10-CM | POA: Diagnosis not present

## 2017-04-25 ENCOUNTER — Encounter: Payer: Self-pay | Admitting: Physician Assistant

## 2017-04-25 ENCOUNTER — Ambulatory Visit (INDEPENDENT_AMBULATORY_CARE_PROVIDER_SITE_OTHER): Payer: PPO | Admitting: Physician Assistant

## 2017-04-25 VITALS — BP 122/70 | HR 70 | Temp 98.5°F | Resp 16

## 2017-04-25 DIAGNOSIS — L03113 Cellulitis of right upper limb: Secondary | ICD-10-CM

## 2017-04-25 DIAGNOSIS — S5011XA Contusion of right forearm, initial encounter: Secondary | ICD-10-CM | POA: Diagnosis not present

## 2017-04-25 MED ORDER — CEPHALEXIN 500 MG PO CAPS: 500.0000 mg | ORAL_CAPSULE | Freq: Two times a day (BID) | ORAL | 0 refills | Status: AC

## 2017-04-25 NOTE — Progress Notes (Signed)
Patient presents to clinic today c/o pain and swelling of R forearm over the past week. States he noted a swollen lump under the skin initially that has resolved since then, followed by bruising of the proximal forearm. Over the past few days has noted redness and hardness of the skin around that area. Does note easy tearing of skin due to plavix and did have a tear to the skin a few days before redness and pain began. Arm is now tender to touch. Denies decreased ROM. Denies fever, chills, malaise or fatigue.   Past Medical History:  Diagnosis Date  . Anemia   . Aortic stenosis   . Aortic stenosis, severe    S/p Edwards Sapien 3 Transcatheter Heart Valve (size 26 mm, model # [id number], serial # [id number])  . Arthritis   . Bursitis   . Cataract    left immature  . Cellulitis 10/12/2015  . Complication of anesthesia    Halucinations  . Constipation    takes Miralax daily as well as Senokot daily  . DDD (degenerative disc disease)   . Gout    takes Allopurinol daily  . History of blood clots 1962   knee  . History of blood transfusion    no abnormal reaction noted  . History of shingles   . Hyperlipidemia    takes Atorvastatin daily  . Hypertension    takes Lisinopril daily  . Low back pain 01/25/2017  . Neck pain on left side 01/25/2017  . OSA (obstructive sleep apnea)   . Osteoarthritis   . Peripheral edema    takes Furosemide daily  . Peripheral neuropathy    takes Gabapentin daily  . Peroneal palsy    significant right foot drop  . Pneumonia 25+yrs ago   hx of  . Urinary frequency   . Urinary urgency   . Valvular heart disease     Current Outpatient Prescriptions on File Prior to Visit  Medication Sig Dispense Refill  . acetaminophen (TYLENOL) 500 MG tablet Take 500 mg by mouth 2 (two) times daily.     [name] allopurinol (ZYLOPRIM) 300 MG tablet TAKE 1 TABLET (300 MG TOTAL) BY MOUTH DAILY. 30 tablet 6  . aspirin 81 MG EC tablet TAKE 1 TABLET (81 MG TOTAL) BY MOUTH DAILY. 30  tablet 11  . atorvastatin (LIPITOR) 40 MG tablet TAKE 1 TABLET BY MOUTH EVERY DAY 90 tablet 2  . cholecalciferol (VITAMIN D) 1000 UNITS tablet Take 2,000 Units by mouth daily.     . clopidogrel (PLAVIX) 75 MG tablet TAKE 1 TABLET (75 MG TOTAL) BY MOUTH DAILY WITH BREAKFAST. 90 tablet 3  . Coenzyme Q10 (COQ10) 200 MG CAPS Take 200 mg by mouth daily.    . furosemide (LASIX) 20 MG tablet TAKE 1 TABLET BY MOUTH TWICE A DAY 180 tablet 3  . gabapentin (NEURONTIN) 300 MG capsule TAKE 1 CAPSULE (300 MG TOTAL) BY MOUTH 2 (TWO) TIMES DAILY. 180 capsule 1  . hydrocortisone (ANUSOL-HC) 25 MG suppository Place 1 suppository (25 mg total) rectally 2 (two) times daily. 12 suppository 0  . lisinopril (PRINIVIL,ZESTRIL) 10 MG tablet TAKE 1 TABLET BY MOUTH EVERY DAY 90 tablet 0  . loratadine (CLARITIN) 10 MG tablet Take 1 tablet (10 mg total) by mouth daily. 30 tablet 0  . Multiple Vitamin (MULTIVITAMIN WITH MINERALS) TABS Take 1 tablet by mouth daily.    [name] oxyCODONE-acetaminophen (PERCOCET/ROXICET) 5-325 MG tablet Take 2 tablets by mouth every 4 (four) hours as needed. 6  tablet 0  . polyethylene glycol (MIRALAX / GLYCOLAX) packet Take 17 g by mouth daily.    . sennosides-docusate sodium (SENOKOT-S) 8.6-50 MG tablet Take 1 tablet by mouth 2 (two) times daily.    [name] tiZANidine (ZANAFLEX) 4 MG tablet TAKE 1 TABLET (4 MG TOTAL) BY MOUTH EVERY 6 (SIX) HOURS AS NEEDED FOR MUSCLE SPASMS. 30 tablet 1  . traMADol (ULTRAM) 50 MG tablet TAKE 1 TABLET EVERY 6 HOURS AS NEEDED 90 tablet 0  . albuterol (PROVENTIL HFA;VENTOLIN HFA) 108 (90 Base) MCG/ACT inhaler Inhale 2 puffs into the lungs every 6 (six) hours as needed for wheezing or shortness of breath. (Patient not taking: Reported on 04/25/2017) 1 Inhaler 0   No current facility-administered medications on file prior to visit.     Allergies  Allergen Reactions  . Coumadin [Warfarin Sodium] Other (See Comments)    States he can't be on this-bleeds out    Family History    Problem Relation Age of Onset  . Arthritis Sister        "crippling"  . Arthritis Sister     Social History   Social History  . Marital status: Married    Spouse name: N/A  . Number of children: N/A  . Years of education: N/A   Occupational History  . retired Administrator    Social History Main Topics  . Smoking status: Former Smoker    Years: 30.00    Types: Pipe    Quit date: 09/20/2006  . Smokeless tobacco: Never Used  . Alcohol use No  . Drug use: No  . Sexual activity: No     Comment: lives with wife, retired, no dietary restrictions   Other Topics Concern  . None   Social History Narrative  . None   Review of Systems - See HPI.  All other ROS are negative.  BP 122/70   Pulse 70   Temp 98.5 F (36.9 C) (Oral)   Resp 16   SpO2 95%   Physical Exam  Constitutional: He is oriented to person, place, and time and well-developed, well-nourished, and in no distress.  HENT:  Head: Normocephalic and atraumatic.  Eyes: Conjunctivae are normal.  Neck: Neck supple.  Cardiovascular: Normal rate, regular rhythm, normal heart sounds and intact distal pulses.   Pulmonary/Chest: Effort normal and breath sounds normal. No respiratory distress. He has no wheezes. He has no rales. He exhibits no tenderness.  Musculoskeletal:       Right elbow: He exhibits normal range of motion and no effusion. No tenderness found.       Right forearm: He exhibits tenderness and swelling.       Arms: Neurological: He is alert and oriented to person, place, and time.  Skin: Skin is warm and dry.  Psychiatric: Affect normal.  Vitals reviewed.   Recent Results (from the past 2160 hour(s))  CBC     Status: Abnormal   Collection Time: 01/25/17  2:15 PM  Result Value Ref Range   WBC 8.8 4.0 - 10.5 K/uL   RBC 3.96 (L) 4.22 - 5.81 Mil/uL   Platelets 175.0 150.0 - 400.0 K/uL   Hemoglobin 12.3 (L) 13.0 - 17.0 g/dL   HCT 37.9 (L) 39.0 - 52.0 %   MCV 95.7 78.0 - 100.0 fl   MCHC 32.5 30.0 -  36.0 g/dL   RDW 16.7 (H) 11.5 - 15.5 %  Comprehensive metabolic panel     Status: Abnormal   Collection Time: 01/25/17  2:15  PM  Result Value Ref Range   Sodium 137 135 - 145 mEq/L   Potassium 5.0 3.5 - 5.1 mEq/L   Chloride 100 96 - 112 mEq/L   CO2 33 (H) 19 - 32 mEq/L   Glucose, Bld 96 70 - 99 mg/dL   BUN 34 (H) 6 - 23 mg/dL   Creatinine, Ser 1.37 0.40 - 1.50 mg/dL   Total Bilirubin 0.5 0.2 - 1.2 mg/dL   Alkaline Phosphatase 80 39 - 117 U/L   AST 21 0 - 37 U/L   ALT 18 0 - 53 U/L   Total Protein 6.7 6.0 - 8.3 g/dL   Albumin 3.9 3.5 - 5.2 g/dL   Calcium 9.5 8.4 - 10.5 mg/dL   GFR 53.91 (L) >60.00 mL/min  Lipid panel     Status: Abnormal   Collection Time: 01/25/17  2:15 PM  Result Value Ref Range   Cholesterol 138 0 - 200 mg/dL    Comment: ATP III Classification       Desirable:  < 200 mg/dL               Borderline High:  200 - 239 mg/dL          High:  > = 240 mg/dL   Triglycerides 197.0 (H) 0.0 - 149.0 mg/dL    Comment: Normal:  <150 mg/dLBorderline High:  150 - 199 mg/dL   HDL 27.20 (L) >39.00 mg/dL   VLDL 39.4 0.0 - 40.0 mg/dL   LDL Cholesterol 71 0 - 99 mg/dL   Total CHOL/HDL Ratio 5     Comment:                Men          Women1/2 Average Risk     3.4          3.3Average Risk          5.0          4.42X Average Risk          9.6          7.13X Average Risk          15.0          11.0                       NonHDL 110.31     Comment: NOTE:  Non-HDL goal should be 30 mg/dL higher than patient's LDL goal (i.e. LDL goal of < 70 mg/dL, would have non-HDL goal of < 100 mg/dL)  TSH     Status: None   Collection Time: 01/25/17  2:15 PM  Result Value Ref Range   TSH 1.18 0.35 - 4.50 uIU/mL  Uric acid     Status: None   Collection Time: 01/25/17  2:15 PM  Result Value Ref Range   Uric Acid, Serum 6.8 4.0 - 7.8 mg/dL  CBC with Differential/Platelet     Status: Abnormal   Collection Time: 02/04/17 12:29 PM  Result Value Ref Range   WBC 15.3 (H) 4.0 - 10.5 K/uL   RBC 3.83 (L)  4.22 - 5.81 MIL/uL   Hemoglobin 11.7 (L) 13.0 - 17.0 g/dL   HCT 36.6 (L) 39.0 - 52.0 %   MCV 95.6 78.0 - 100.0 fL   MCH 30.5 26.0 - 34.0 pg   MCHC 32.0 30.0 - 36.0 g/dL   RDW 15.9 (H) 11.5 - 15.5 %   Platelets 155 150 - 400 K/uL  Neutrophils Relative % 88 %   Neutro Abs 13.4 (H) 1.7 - 7.7 K/uL   Lymphocytes Relative 5 %   Lymphs Abs 0.8 0.7 - 4.0 K/uL   Monocytes Relative 7 %   Monocytes Absolute 1.0 0.1 - 1.0 K/uL   Eosinophils Relative 0 %   Eosinophils Absolute 0.0 0.0 - 0.7 K/uL   Basophils Relative 0 %   Basophils Absolute 0.0 0.0 - 0.1 K/uL  Basic metabolic panel     Status: Abnormal   Collection Time: 02/04/17 12:29 PM  Result Value Ref Range   Sodium 135 135 - 145 mmol/L   Potassium 5.7 (H) 3.5 - 5.1 mmol/L   Chloride 100 (L) 101 - 111 mmol/L   CO2 29 22 - 32 mmol/L   Glucose, Bld 115 (H) 65 - 99 mg/dL   BUN 39 (H) 6 - 20 mg/dL   Creatinine, Ser 1.64 (H) 0.61 - 1.24 mg/dL   Calcium 8.9 8.9 - 10.3 mg/dL   GFR calc non Af Amer 40 (L) >60 mL/min   GFR calc Af Amer 46 (L) >60 mL/min    Comment: (NOTE) The eGFR has been calculated using the CKD EPI equation. This calculation has not been validated in all clinical situations. eGFR's persistently <60 mL/min signify possible Chronic Kidney Disease.    Anion gap 6 5 - 15   Assessment/Plan: 1. Cellulitis of right forearm Will check labs today. ROM of elbow within normal limits. Afebrile. Start Keflex BID x 7 days. Close follow-up with PCP scheduled for 2 days. ER precautions given. - CBC w/Diff - Sed Rate (ESR) - cephALEXin (KEFLEX) 500 MG capsule; Take 1 capsule (500 mg total) by mouth 2 (two) times daily.  Dispense: 14 capsule; Refill: 0  2. Traumatic hematoma of right forearm, initial encounter Start ice and elevation. Pain medication reviewed. Follow-up with PCP as scheduled.    Leeanne Rio, PA-C

## 2017-04-25 NOTE — Progress Notes (Signed)
Pre visit review using our clinic review tool, if applicable. No additional management support is needed unless otherwise documented below in the visit note. 

## 2017-04-25 NOTE — Patient Instructions (Signed)
Please start antibiotic as directed. Keep skin clean and dry.  Limit salt intake (due to swelling) Ice and elevate the extremity.  I will call you with lab results. Please schedule a follow-up with Dr. Charlett Blake or someone in her office for Thursday as I am not in the office the rest of the week and you need a close follow-up in case things are not improving.   If you develop fever or worsening tenderness/redness with antibiotic, please go to the ER.

## 2017-04-28 ENCOUNTER — Ambulatory Visit (INDEPENDENT_AMBULATORY_CARE_PROVIDER_SITE_OTHER): Payer: PPO | Admitting: Medical

## 2017-04-28 ENCOUNTER — Encounter: Payer: Self-pay | Admitting: Medical

## 2017-04-28 VITALS — BP 98/75 | HR 64 | Temp 98.3°F | Resp 16

## 2017-04-28 DIAGNOSIS — L089 Local infection of the skin and subcutaneous tissue, unspecified: Secondary | ICD-10-CM | POA: Diagnosis not present

## 2017-04-28 NOTE — Progress Notes (Signed)
Subjective:    Patient ID: [name], male    DOB: Feb 17, 1941, 76 y.o.   MRN: [id number]  HPI  Pt in for follow up. He states he had friction blood blister rt proximal forearm from using his walker. The area scabbed over with yellow appearance Then he got diffuse redness all the way down rt forearm toward hand and some redness to distal tricep. Saw provider other clinic on 04-25-2017. He states already a lot better.  No fever, no chills or sweats.   Pt states after after antibiotic he feels better. But not completely.    Review of Systems  Constitutional: Negative for chills, fatigue and fever.  Respiratory: Negative for cough, chest tightness, shortness of breath and wheezing.   Cardiovascular: Negative for chest pain and palpitations.  Gastrointestinal: Negative for abdominal pain.  Musculoskeletal:       See hpi.  Skin:       See hpi and exam.  Hematological: Negative for adenopathy. Does not bruise/bleed easily.  Psychiatric/Behavioral: Negative for behavioral problems and confusion.   Past Medical History:  Diagnosis Date  . Anemia   . Aortic stenosis   . Aortic stenosis, severe    S/p Edwards Sapien 3 Transcatheter Heart Valve (size 26 mm, model # [id number], serial # [id number])  . Arthritis   . Bursitis   . Cataract    left immature  . Cellulitis 10/12/2015  . Complication of anesthesia    Halucinations  . Constipation    takes Miralax daily as well as Senokot daily  . DDD (degenerative disc disease)   . Gout    takes Allopurinol daily  . History of blood clots 1962   knee  . History of blood transfusion    no abnormal reaction noted  . History of shingles   . Hyperlipidemia    takes Atorvastatin daily  . Hypertension    takes Lisinopril daily  . Low back pain 01/25/2017  . Neck pain on left side 01/25/2017  . OSA (obstructive sleep apnea)   . Osteoarthritis   . Peripheral edema    takes Furosemide daily  . Peripheral neuropathy    takes Gabapentin daily   . Peroneal palsy    significant right foot drop  . Pneumonia 25+yrs ago   hx of  . Urinary frequency   . Urinary urgency   . Valvular heart disease      Social History   Social History  . Marital status: Married    Spouse name: N/A  . Number of children: N/A  . Years of education: N/A   Occupational History  . retired Administrator    Social History Main Topics  . Smoking status: Former Smoker    Years: 30.00    Types: Pipe    Quit date: 09/20/2006  . Smokeless tobacco: Never Used  . Alcohol use No  . Drug use: No  . Sexual activity: No     Comment: lives with wife, retired, no dietary restrictions   Other Topics Concern  . Not on file   Social History Narrative  . No narrative on file    Past Surgical History:  Procedure Laterality Date  . CARDIAC CATHETERIZATION N/A 05/02/2015   Procedure: Right/Left Heart Cath and Coronary Angiography;  Surgeon: Burnell Blanks, MD;  Location: [place] [facility];  Service: Cardiovascular;  Laterality: N/A;  . COLONOSCOPY N/A 02/07/2013   Procedure: COLONOSCOPY;  Surgeon: Juanita Craver, MD;  Location: [facility] ENDOSCOPY;  Service: Endoscopy;  Laterality: N/A;  . COLONOSCOPY N/A 02/09/2013   Procedure: COLONOSCOPY;  Surgeon: Beryle Beams, MD;  Location: [place];  Service: Endoscopy;  Laterality: N/A;  . ESOPHAGOGASTRODUODENOSCOPY N/A 02/07/2013   Procedure: ESOPHAGOGASTRODUODENOSCOPY (EGD);  Surgeon: Juanita Craver, MD;  Location: [facility] ENDOSCOPY;  Service: Endoscopy;  Laterality: N/A;  . GIVENS CAPSULE STUDY N/A 02/09/2013   Procedure: GIVENS CAPSULE STUDY;  Surgeon: Beryle Beams, MD;  Location: [place];  Service: Endoscopy;  Laterality: N/A;  . HERNIA REPAIR     umbilical hernia  . KNEE SURGERY Right    multiple knee surgeries due to complication of R TKA  . LAMINOTOMY  5573   c6-t2  . SPINE SURGERY    . TEE WITHOUT CARDIOVERSION N/A 03/07/2015   Procedure: TRANSESOPHAGEAL ECHOCARDIOGRAM (TEE);  Surgeon: Josue Hector,  MD;  Location: [facility] ENDOSCOPY;  Service: Cardiovascular;  Laterality: N/A;  ANES TO BRING PROPOFOL PER DOCTOR  . TEE WITHOUT CARDIOVERSION N/A 06/10/2015   Procedure: TRANSESOPHAGEAL ECHOCARDIOGRAM (TEE);  Surgeon: Burnell Blanks, MD;  Location: [place];  Service: Open Heart Surgery;  Laterality: N/A;  . TOTAL KNEE ARTHROPLASTY     right  . TRANSCATHETER AORTIC VALVE REPLACEMENT, TRANSFEMORAL Left 06/10/2015   Procedure: TRANSCATHETER AORTIC VALVE REPLACEMENT, TRANSFEMORAL;  Surgeon: Burnell Blanks, MD;  Location: [place];  Service: Open Heart Surgery;  Laterality: Left;    Family History  Problem Relation Age of Onset  . Arthritis Sister        "crippling"  . Arthritis Sister     Allergies  Allergen Reactions  . Coumadin [Warfarin Sodium] Other (See Comments)    States he can't be on this-bleeds out    Current Outpatient Prescriptions on File Prior to Visit  Medication Sig Dispense Refill  . acetaminophen (TYLENOL) 500 MG tablet Take 500 mg by mouth 2 (two) times daily.     [name] allopurinol (ZYLOPRIM) 300 MG tablet TAKE 1 TABLET (300 MG TOTAL) BY MOUTH DAILY. 30 tablet 6  . aspirin 81 MG EC tablet TAKE 1 TABLET (81 MG TOTAL) BY MOUTH DAILY. 30 tablet 11  . atorvastatin (LIPITOR) 40 MG tablet TAKE 1 TABLET BY MOUTH EVERY DAY 90 tablet 2  . cephALEXin (KEFLEX) 500 MG capsule Take 1 capsule (500 mg total) by mouth 2 (two) times daily. 14 capsule 0  . cholecalciferol (VITAMIN D) 1000 UNITS tablet Take 2,000 Units by mouth daily.     . clopidogrel (PLAVIX) 75 MG tablet TAKE 1 TABLET (75 MG TOTAL) BY MOUTH DAILY WITH BREAKFAST. 90 tablet 3  . Coenzyme Q10 (COQ10) 200 MG CAPS Take 200 mg by mouth daily.    . furosemide (LASIX) 20 MG tablet TAKE 1 TABLET BY MOUTH TWICE A DAY 180 tablet 3  . gabapentin (NEURONTIN) 300 MG capsule TAKE 1 CAPSULE (300 MG TOTAL) BY MOUTH 2 (TWO) TIMES DAILY. 180 capsule 1  . hydrocortisone (ANUSOL-HC) 25 MG suppository Place 1 suppository (25 mg total)  rectally 2 (two) times daily. 12 suppository 0  . lisinopril (PRINIVIL,ZESTRIL) 10 MG tablet TAKE 1 TABLET BY MOUTH EVERY DAY 90 tablet 0  . loratadine (CLARITIN) 10 MG tablet Take 1 tablet (10 mg total) by mouth daily. 30 tablet 0  . Multiple Vitamin (MULTIVITAMIN WITH MINERALS) TABS Take 1 tablet by mouth daily.    [name] oxyCODONE-acetaminophen (PERCOCET/ROXICET) 5-325 MG tablet Take 2 tablets by mouth every 4 (four) hours as needed. 6 tablet 0  . polyethylene glycol (MIRALAX / GLYCOLAX) packet Take 17 g  by mouth daily.    . sennosides-docusate sodium (SENOKOT-S) 8.6-50 MG tablet Take 1 tablet by mouth 2 (two) times daily.    [name] tiZANidine (ZANAFLEX) 4 MG tablet TAKE 1 TABLET (4 MG TOTAL) BY MOUTH EVERY 6 (SIX) HOURS AS NEEDED FOR MUSCLE SPASMS. 30 tablet 1  . traMADol (ULTRAM) 50 MG tablet TAKE 1 TABLET EVERY 6 HOURS AS NEEDED 90 tablet 0   No current facility-administered medications on file prior to visit.     BP 98/75   Pulse 64   Temp 98.3 F (36.8 C) (Oral)   Resp 16   SpO2 94%       Objective:   Physical Exam  General- No acute distress. Pleasant patient. Neck- Full range of motion, no jvd Lungs- Clear, even and unlabored. Heart- regular rate and rhythm. Neurologic- CNII- XII grossly intact.  Rt upper ext- proximal forearm below elbow 2 cm yellow colored scabs. No surrounding redness or warm. forearm looks symmetric to left side. No induration of forearm or fluctuance.     Assessment & Plan:  For area of skin infection/cellulitis rt forearm continue keflex antibiotic. The area looks much improved compared to prior provider note and your description. Will get culture of the original area of infection from scab region. Follow the culture and see if antibiotic change needed. Stay on keflex.  Follow up 7-10 days or as needed

## 2017-04-28 NOTE — Patient Instructions (Addendum)
For area of skin infection/cellulitis rt forearm continue keflex antibiotic. The area looks much improved compared to prior provider note and your description. Will get culture of the original area of infection from scab region. Follow the culture and see if antibiotic change needed. Stay on keflex.  Follow up 7-10 days or as needed

## 2017-04-30 LAB — WOUND CULTURE
Gram Stain: NONE SEEN
Gram Stain: NONE SEEN
Organism ID, Bacteria: NO GROWTH

## 2017-05-04 DIAGNOSIS — R0602 Shortness of breath: Secondary | ICD-10-CM | POA: Diagnosis not present

## 2017-05-04 DIAGNOSIS — G4733 Obstructive sleep apnea (adult) (pediatric): Secondary | ICD-10-CM | POA: Diagnosis not present

## 2017-05-16 DIAGNOSIS — G4733 Obstructive sleep apnea (adult) (pediatric): Secondary | ICD-10-CM | POA: Diagnosis not present

## 2017-05-16 DIAGNOSIS — R0602 Shortness of breath: Secondary | ICD-10-CM | POA: Diagnosis not present

## 2017-05-22 DIAGNOSIS — G4733 Obstructive sleep apnea (adult) (pediatric): Secondary | ICD-10-CM | POA: Diagnosis not present

## 2017-05-24 IMAGING — CT CT ANGIO CHEST
2 of 10 series · 15 of 46 positions shown · IV contrast (omnipaque)
Comparison: No priors.

CLINICAL DATA: 73-year-old male with history of severe aortic
stenosis. Preprocedural study prior to potential transcatheter
aortic valve replacement (TAVR).

EXAM:
CT ANGIOGRAPHY CHEST, ABDOMEN AND PELVIS
TECHNIQUE: Multidetector CT imaging through the chest, abdomen and pelvis was
performed using the standard protocol during bolus administration of
intravenous contrast. Multiplanar reconstructed images and MIPs were
obtained and reviewed to evaluate the vascular anatomy.
CONTRAST:  100mL OMNIPAQUE IOHEXOL 350 MG/ML SOLN

[Series 6: dissection 2.0 efov · axial · 1.27mm/px · z∈[+641,+1217]mm · 12 of 322 slices shown]
[im 17/322  lung]
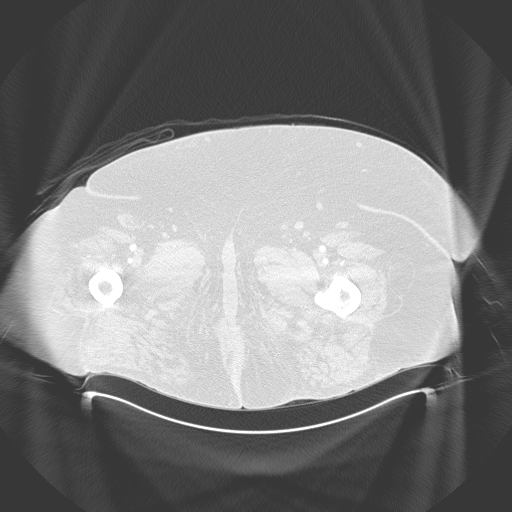
[im 51/322  soft-tissue]
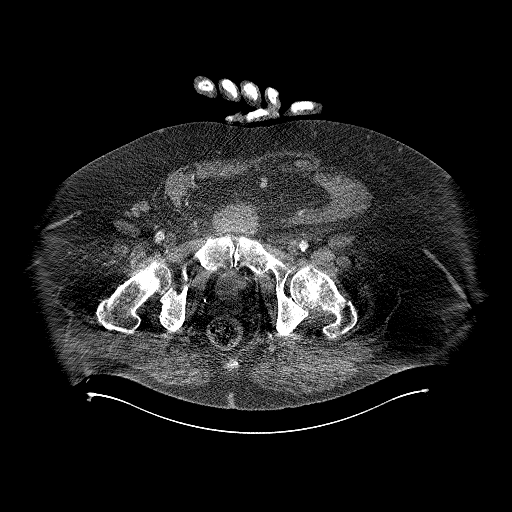
[im 68/322  lung]
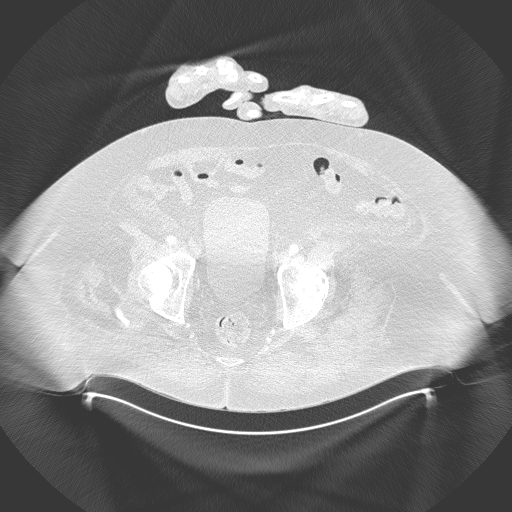
[im 102/322  soft-tissue]
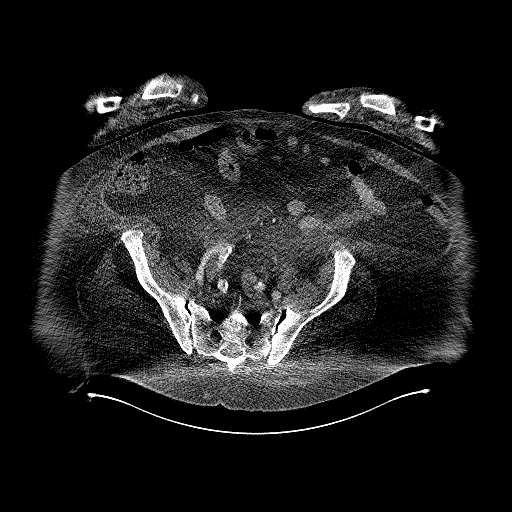
[im 119/322  lung]
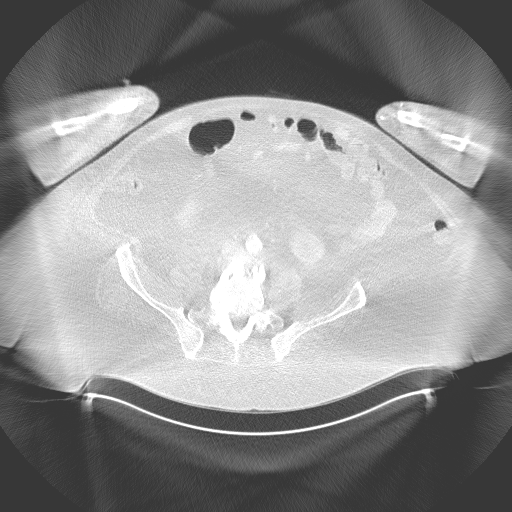
[im 153/322  soft-tissue]
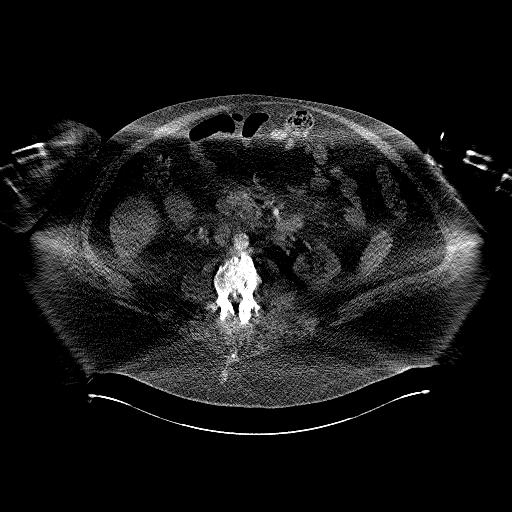
[im 169/322  lung]
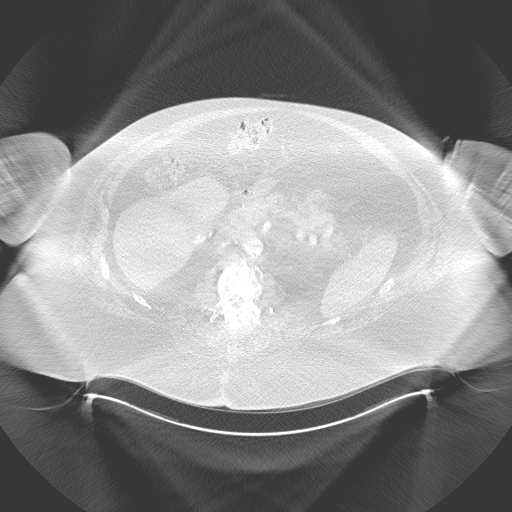
[im 203/322  soft-tissue]
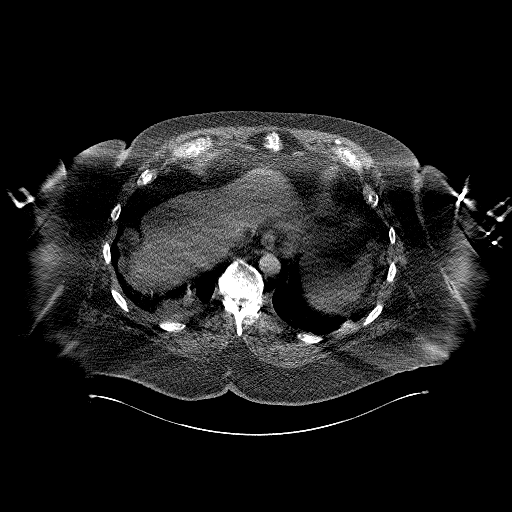
[im 220/322  lung]
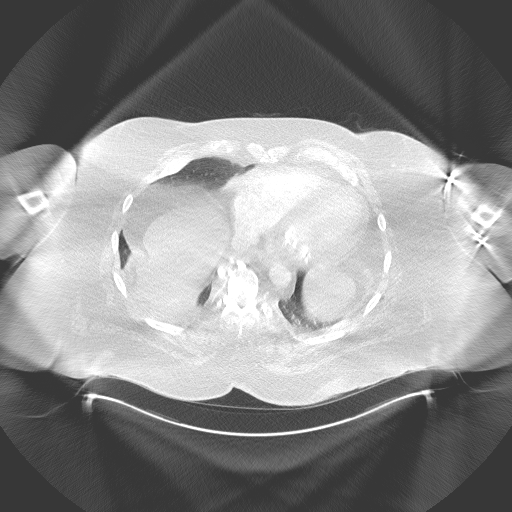
[im 254/322  soft-tissue]
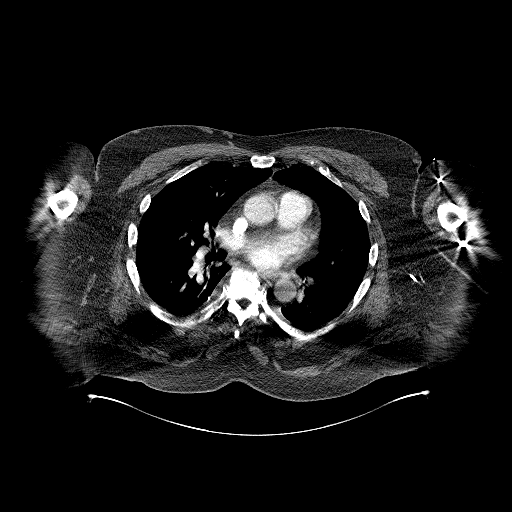
[im 271/322  lung]
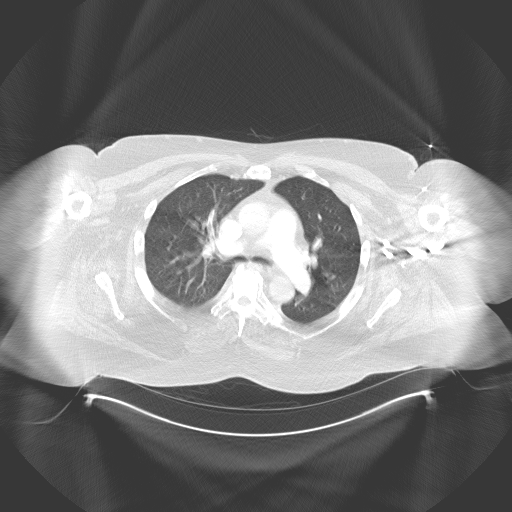
[im 305/322  soft-tissue]
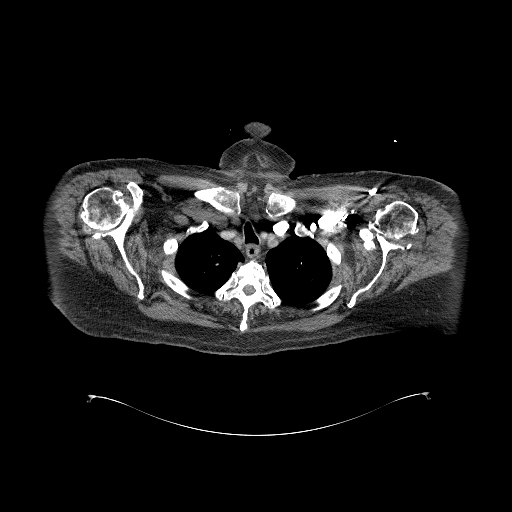

[Series 602: coronal mpr · coronal · 1.27mm/px · 3 of 132 slices shown]
[im 33/132  soft-tissue]
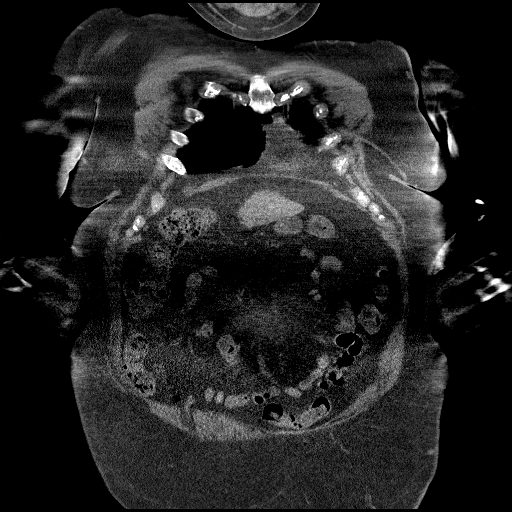
[im 66/132  soft-tissue]
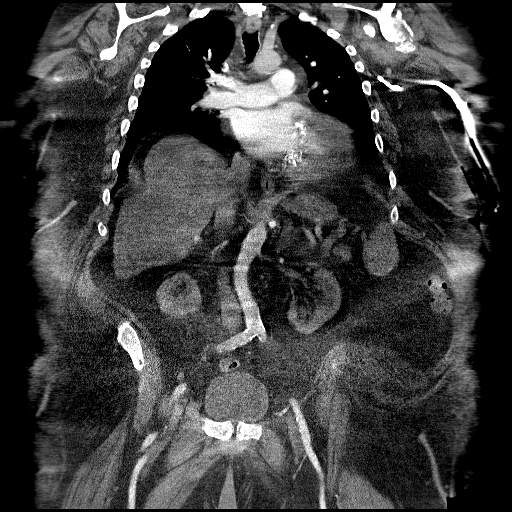
[im 99/132  soft-tissue]
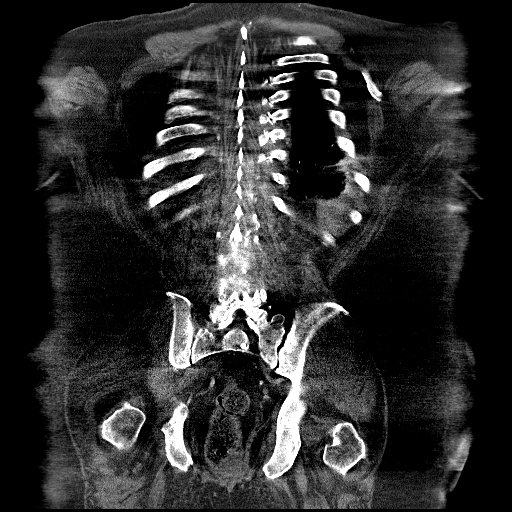

[15 of 46 positions shown; findings below may reference images not displayed]

FINDINGS: Comment: Study is exceedingly limited by excessive image noise from
the patient's large body habitus, accentuated by the fact that the
patient could not raise his arms for the examination, which causes a
large amount of beam hardening artifact in the thorax.

CTA CHEST FINDINGS

Mediastinum/Lymph Nodes: Heart size is borderline enlarged. There is
no significant pericardial fluid, thickening or pericardial
calcification. There is atherosclerosis of the thoracic aorta, the
great vessels of the mediastinum and the coronary arteries,
including calcified atherosclerotic plaque in the left main, left
anterior descending, left circumflex and right coronary arteries.
Severe calcifications of the aortic valve, mitral-aortic
intervalvular fibrosa, and the mitral annulus. No pathologically
enlarged mediastinal or hilar lymph nodes. Esophagus is unremarkable
in appearance. No axillary lymphadenopathy.

Lungs/Pleura: 4 mm subpleural nodule in the posterior aspect of the
right upper lobe (image 28 of series 7). Small calcified granulomas
incidentally noted. No larger more suspicious appearing pulmonary
nodules or masses are noted. No acute consolidative airspace
disease. No pleural effusions.

Musculoskeletal/Soft Tissues: There are no aggressive appearing
lytic or blastic lesions noted in the visualized portions of the
skeleton.

CTA ABDOMEN AND PELVIS FINDINGS

Hepatobiliary: Calcified gallstones lying dependently in the
gallbladder. No surrounding inflammatory changes to suggest an acute
cholecystitis at this time. Liver is largely obscured by artifact,
but no large cystic or solid hepatic lesions are noted. No definite
intra or extrahepatic biliary ductal dilatation.

Pancreas: No definite pancreatic mass or pancreatic ductal
dilatation. No pancreatic or peripancreatic fluid or inflammatory
changes.

Spleen: Unremarkable.

Adrenals/Urinary Tract: Bilateral adrenal glands and the right
kidney are unremarkable in appearance. Mild atrophy of the left
kidney. No hydroureteronephrosis. Urinary bladder is grossly normal
in appearance.

Stomach/Bowel: The appearance of the stomach is normal. No
pathologic dilatation of small bowel or colon.

Vascular/Lymphatic: Vascular findings and measurements pertinent to
the distal T AVR procedure, as detailed below. Mildly enlarged right
inguinal lymph node measuring 13 mm in short axis. No other definite
lymphadenopathy noted in the abdomen or pelvis.

Reproductive: Prostate gland and seminal vesicles are unremarkable
in appearance.

Other: No significant volume of ascites.  No pneumoperitoneum.

Musculoskeletal: There are no aggressive appearing lytic or blastic
lesions noted in the visualized portions of the skeleton.

VASCULAR MEASUREMENTS PERTINENT TO TAVR:

AORTA:

Minimal Aortic Diameter - 13 x 5 mm shortly above the aortic
bifurcation (difficult accurately assess secondary to artifact)

Severity of Aortic Calcification -  severe

RIGHT PELVIS:

Right Common Iliac Artery -

Minimal Diameter - 7.4 x 7.1 mm

Tortuosity - mild

Calcification - moderate

Right External Iliac Artery -

Minimal Diameter - 8.6 x 8.6 mm

Tortuosity - severe

Calcification - minimal

Right Common Femoral Artery -

Minimal Diameter - 6.1 x 4.4 mm

Tortuosity - mild

Calcification - mild

LEFT PELVIS:

Left Common Iliac Artery -

Minimal Diameter - 11.2 x 8.2 mm

Tortuosity - mild

Calcification - moderate

Left External Iliac Artery -

Minimal Diameter - 10.0 x 7.6 mm

Tortuosity - severe

Calcification - mild

Left Common Femoral Artery -

Minimal Diameter - 7.3 x 9.0 mm

Tortuosity - mild

Calcification - mild

Review of the MIP images confirms the above findings.
IMPRESSION: 1. Vascular findings and measurements pertinent to potential TAVR
procedure, as discussed above. This patient does appear likely to
have suitable pelvic arterial access on the left side, although
assessment is exceedingly limited by image noise on today's
examination related to the patient's large body habitus.
2. Extensive calcification of the aortic valve compatible with the
patient's reported clinical history of severe aortic stenosis. There
is also a large amount of calcification of the mitral-aortic
intervalvular fibrosa and the mitral annulus, which may have
negative implications for intraprocedural sizing of the aortic
annulus via transesophageal echocardiography.
3. 4 mm subpleural nodule in the posterior aspect of the right upper
lobe. This is highly nonspecific, and statistically likely a
subpleural lymph node. If the patient is at high risk for
bronchogenic carcinoma, follow-up chest CT at 1 year is recommended.
If the patient is at low risk, no follow-up is needed. This
recommendation follows the consensus statement: Guidelines for
Management of Small Pulmonary Nodules Detected on CT Scans: A
Statement from the [HOSPITAL] as published in Radiology
3222; [DATE].
4. Cholelithiasis without evidence of acute cholecystitis at this
time.
5. Additional incidental findings, as above.

## 2017-05-26 NOTE — Progress Notes (Signed)
Patient ID: [name], male   DOB: 1941-01-23, 76 y.o.   MRN: [id number]    No chief complaint on file.    History of Present Illness: 76 y.o.  male with history of morbid obesity, mild HTN, HLD, gout and severe aortic stenosis who is here today for TAVR  follow up.[name] He underwent TAVR on 06/10/15 with 26 mm Sapien 3 . Left transfemoral approach with cut down by Dr Cyndia Bent He did well following the procedure. There were no complications. He tells me today that he is feeling better. His breathing has improved. He has no pain.He is still limited by chronic joint issues.   More energy and less dyspnea since valve done   Seen by CM 06/11/16 for post TAVR did not comment on LVOT gradient Has had diuretic increased for edema Issues with CPAP sees Dr Annamaria Boots may need dental appliance in addition to help with obstruction   Has back pain and saw Neudalmen One steroid injection with good relief   Past Medical History:  Diagnosis Date  . Anemia   . Aortic stenosis   . Aortic stenosis, severe    S/p Edwards Sapien 3 Transcatheter Heart Valve (size 26 mm, model # [id number], serial # [id number])  . Arthritis   . Bursitis   . Cataract    left immature  . Cellulitis 10/12/2015  . Complication of anesthesia    Halucinations  . Constipation    takes Miralax daily as well as Senokot daily  . DDD (degenerative disc disease)   . Gout    takes Allopurinol daily  . History of blood clots 1962   knee  . History of blood transfusion    no abnormal reaction noted  . History of shingles   . Hyperlipidemia    takes Atorvastatin daily  . Hypertension    takes Lisinopril daily  . Low back pain 01/25/2017  . Neck pain on left side 01/25/2017  . OSA (obstructive sleep apnea)   . Osteoarthritis   . Peripheral edema    takes Furosemide daily  . Peripheral neuropathy    takes Gabapentin daily  . Peroneal palsy    significant right foot drop  . Pneumonia 25+yrs ago   hx of  . Urinary frequency   . Urinary  urgency   . Valvular heart disease     Past Surgical History:  Procedure Laterality Date  . CARDIAC CATHETERIZATION N/A 05/02/2015   Procedure: Right/Left Heart Cath and Coronary Angiography;  Surgeon: Burnell Blanks, MD;  Location: [place] [facility];  Service: Cardiovascular;  Laterality: N/A;  . COLONOSCOPY N/A 02/07/2013   Procedure: COLONOSCOPY;  Surgeon: Juanita Craver, MD;  Location: [facility] ENDOSCOPY;  Service: Endoscopy;  Laterality: N/A;  . COLONOSCOPY N/A 02/09/2013   Procedure: COLONOSCOPY;  Surgeon: Beryle Beams, MD;  Location: [place];  Service: Endoscopy;  Laterality: N/A;  . ESOPHAGOGASTRODUODENOSCOPY N/A 02/07/2013   Procedure: ESOPHAGOGASTRODUODENOSCOPY (EGD);  Surgeon: Juanita Craver, MD;  Location: [facility] ENDOSCOPY;  Service: Endoscopy;  Laterality: N/A;  . GIVENS CAPSULE STUDY N/A 02/09/2013   Procedure: GIVENS CAPSULE STUDY;  Surgeon: Beryle Beams, MD;  Location: [place];  Service: Endoscopy;  Laterality: N/A;  . HERNIA REPAIR     umbilical hernia  . KNEE SURGERY Right    multiple knee surgeries due to complication of R TKA  . LAMINOTOMY  2841   c6-t2  . SPINE SURGERY    . TEE WITHOUT CARDIOVERSION N/A 03/07/2015   Procedure: TRANSESOPHAGEAL ECHOCARDIOGRAM (  TEE);  Surgeon: Josue Hector, MD;  Location: [facility] ENDOSCOPY;  Service: Cardiovascular;  Laterality: N/A;  ANES TO BRING PROPOFOL PER DOCTOR  . TEE WITHOUT CARDIOVERSION N/A 06/10/2015   Procedure: TRANSESOPHAGEAL ECHOCARDIOGRAM (TEE);  Surgeon: Burnell Blanks, MD;  Location: [place];  Service: Open Heart Surgery;  Laterality: N/A;  . TOTAL KNEE ARTHROPLASTY     right  . TRANSCATHETER AORTIC VALVE REPLACEMENT, TRANSFEMORAL Left 06/10/2015   Procedure: TRANSCATHETER AORTIC VALVE REPLACEMENT, TRANSFEMORAL;  Surgeon: Burnell Blanks, MD;  Location: [place];  Service: Open Heart Surgery;  Laterality: Left;    Current Outpatient Prescriptions  Medication Sig Dispense Refill  . acetaminophen (TYLENOL)  500 MG tablet Take 500 mg by mouth 2 (two) times daily.     [name] allopurinol (ZYLOPRIM) 300 MG tablet TAKE 1 TABLET (300 MG TOTAL) BY MOUTH DAILY. 30 tablet 6  . aspirin 81 MG EC tablet TAKE 1 TABLET (81 MG TOTAL) BY MOUTH DAILY. 30 tablet 11  . atorvastatin (LIPITOR) 40 MG tablet TAKE 1 TABLET BY MOUTH EVERY DAY 90 tablet 2  . cholecalciferol (VITAMIN D) 1000 UNITS tablet Take 2,000 Units by mouth daily.     . clopidogrel (PLAVIX) 75 MG tablet TAKE 1 TABLET (75 MG TOTAL) BY MOUTH DAILY WITH BREAKFAST. 90 tablet 3  . Coenzyme Q10 (COQ10) 200 MG CAPS Take 200 mg by mouth daily.    . furosemide (LASIX) 20 MG tablet TAKE 1 TABLET BY MOUTH TWICE A DAY 180 tablet 3  . gabapentin (NEURONTIN) 300 MG capsule TAKE 1 CAPSULE (300 MG TOTAL) BY MOUTH 2 (TWO) TIMES DAILY. 180 capsule 1  . lisinopril (PRINIVIL,ZESTRIL) 10 MG tablet TAKE 1 TABLET BY MOUTH EVERY DAY 90 tablet 0  . Multiple Vitamin (MULTIVITAMIN WITH MINERALS) TABS Take 1 tablet by mouth daily.    . polyethylene glycol (MIRALAX / GLYCOLAX) packet Take 17 g by mouth daily.    . sennosides-docusate sodium (SENOKOT-S) 8.6-50 MG tablet Take 1 tablet by mouth 2 (two) times daily.    . traMADol (ULTRAM) 50 MG tablet TAKE 1 TABLET EVERY 6 HOURS AS NEEDED 90 tablet 0   No current facility-administered medications for this visit.     Allergies  Allergen Reactions  . Coumadin [Warfarin Sodium] Other (See Comments)    States he can't be on this-bleeds out    Social History   Social History  . Marital status: Married    Spouse name: N/A  . Number of children: N/A  . Years of education: N/A   Occupational History  . retired Administrator    Social History Main Topics  . Smoking status: Former Smoker    Years: 30.00    Types: Pipe    Quit date: 09/20/2006  . Smokeless tobacco: Never Used  . Alcohol use No  . Drug use: No  . Sexual activity: No     Comment: lives with wife, retired, no dietary restrictions   Other Topics Concern  . Not on  file   Social History Narrative  . No narrative on file    Family History  Problem Relation Age of Onset  . Arthritis Sister        "crippling"  . Arthritis Sister     Review of Systems:  As stated in the HPI and otherwise negative.   BP 110/80   Pulse 65   Ht 6\' 2"  (1.88 m)   SpO2 96%   Physical Examination: Affect appropriate Morbidly obese white male  HEENT:  normal Neck supple with no adenopathy JVP normal no bruits no thyromegaly Lungs clear with no wheezing and good diaphragmatic motion Heart:  S1/S2 SEM no AR  murmur, no rub, gallop or click PMI normal Abdomen: benighn, BS positve, no tenderness, no AAA no bruit.  No HSM or HJR Distal pulses intact with no bruits Plus 2 bilateral LE edema with stasis  Neuro non-focal Skin warm and dry No muscular weakness  Echo: 06/11/16   EF 55-60%  ? LVOT gradient 45 mmHg Grade 2 diastolic  TAVR valve no leak mean gradient 12 mmHg Peak 22 mmHg   EKG:    06/10/16  SR rate 86 RBBB stable   Recent Labs: 01/25/2017: ALT 18; TSH 1.18 02/04/2017: BUN 39; Creatinine, Ser 1.64; Hemoglobin 11.7; Platelets 155; Potassium 5.7; Sodium 135   Lipid Panel    Component Value Date/Time   CHOL 138 01/25/2017 1415   TRIG 197.0 (H) 01/25/2017 1415   HDL 27.20 (L) 01/25/2017 1415   CHOLHDL 5 01/25/2017 1415   VLDL 39.4 01/25/2017 1415   LDLCALC 71 01/25/2017 1415     Wt Readings from Last 3 Encounters:  02/04/17 (!) 400 lb (181.4 kg)  01/25/17 (!) 400 lb (181.4 kg)  11/11/16 (!) 385 lb (174.6 kg)     Other studies Reviewed: Additional studies/ records that were reviewed today include: I have reviewed the echo images from today Review of the above records demonstrates: stable bioprosthetic valve   Assessment and Plan:   1. Severe aortic valve stenosis: s/p TAVR. 26 mm valve  Doing well. NYHA class 2 symptoms. Echo 06/11/16 with good function to valve  He will continue ASA and Plavix. He will need antibiotic prophylaxis before  dental or surgical procedures. Not clear to me that LVOT gradient Recorded is not MR signal as images are so poor  2. Chol:   Cholesterol is at goal.  Continue current dose of statin and diet Rx.  No myalgias or side effects.  F/U  LFT's in 6 months. Lab Results  Component Value Date   LDLCALC 71 01/25/2017   3. OSA:  Sees Dr Annamaria Boots issues with bipap May need dental appliance and bipap.   4. Back pain: good relief with steroid injection f/u pain clinic             Jenkins Rouge

## 2017-05-30 ENCOUNTER — Encounter: Payer: Self-pay | Admitting: Cardiovascular Disease

## 2017-05-30 ENCOUNTER — Ambulatory Visit (INDEPENDENT_AMBULATORY_CARE_PROVIDER_SITE_OTHER): Payer: PPO | Admitting: Cardiovascular Disease

## 2017-05-30 VITALS — BP 110/80 | HR 65 | Ht 74.0 in

## 2017-05-30 DIAGNOSIS — Z952 Presence of prosthetic heart valve: Secondary | ICD-10-CM

## 2017-05-30 NOTE — Patient Instructions (Addendum)

## 2017-06-01 ENCOUNTER — Other Ambulatory Visit: Payer: Self-pay | Admitting: Family Medicine

## 2017-06-02 NOTE — Telephone Encounter (Signed)
Requesting: tramadol   Contract:yes UDS: yes low risk next due 06/09/2017 Last OV: 05/30/2017 Next OV: 07/26/2017 Last Refill: 03/07/17  #[id number]   Please advise

## 2017-06-04 DIAGNOSIS — G4733 Obstructive sleep apnea (adult) (pediatric): Secondary | ICD-10-CM | POA: Diagnosis not present

## 2017-06-04 DIAGNOSIS — R0602 Shortness of breath: Secondary | ICD-10-CM | POA: Diagnosis not present

## 2017-06-16 DIAGNOSIS — R0602 Shortness of breath: Secondary | ICD-10-CM | POA: Diagnosis not present

## 2017-06-16 DIAGNOSIS — G4733 Obstructive sleep apnea (adult) (pediatric): Secondary | ICD-10-CM | POA: Diagnosis not present

## 2017-06-21 DIAGNOSIS — G4733 Obstructive sleep apnea (adult) (pediatric): Secondary | ICD-10-CM | POA: Diagnosis not present

## 2017-06-22 ENCOUNTER — Other Ambulatory Visit: Payer: Self-pay | Admitting: Family Medicine

## 2017-06-29 ENCOUNTER — Encounter: Payer: Self-pay | Admitting: Family Medicine

## 2017-07-01 ENCOUNTER — Other Ambulatory Visit: Payer: Self-pay | Admitting: Family Medicine

## 2017-07-03 ENCOUNTER — Encounter: Payer: Self-pay | Admitting: Internal Medicine

## 2017-07-04 ENCOUNTER — Ambulatory Visit (INDEPENDENT_AMBULATORY_CARE_PROVIDER_SITE_OTHER): Payer: PPO | Admitting: Internal Medicine

## 2017-07-04 ENCOUNTER — Encounter: Payer: Self-pay | Admitting: Internal Medicine

## 2017-07-04 VITALS — BP 118/76 | HR 89

## 2017-07-04 DIAGNOSIS — J449 Chronic obstructive pulmonary disease, unspecified: Secondary | ICD-10-CM

## 2017-07-04 DIAGNOSIS — R0602 Shortness of breath: Secondary | ICD-10-CM | POA: Diagnosis not present

## 2017-07-04 DIAGNOSIS — J9611 Chronic respiratory failure with hypoxia: Secondary | ICD-10-CM | POA: Diagnosis not present

## 2017-07-04 DIAGNOSIS — G4733 Obstructive sleep apnea (adult) (pediatric): Secondary | ICD-10-CM

## 2017-07-04 NOTE — Patient Instructions (Addendum)
Order- DME Advanced- Is patient eligible for NIV? Residual AHI on BIPAP is 84.7 on recent download.  Dx OSA, chronic respiratory failure with hypoxia                                         Patient may rent POC for O2 2-3L pulse for travel    Please call as needed

## 2017-07-04 NOTE — Progress Notes (Signed)
HPI M former smoker followed for OSA, COPD, chronic hypoxic respiratory failure complicated by morbid obesity, OHS, aortic stenosis/ AVR,  Osteoarthritis PFT 05/05/2015-severe obstructive airways disease, insignificant response to bronchodilator, severe restriction, moderate diffusion defect FVC 2.17/46%, FEV1 1.50/44%, ratio 0.69, TLC 64%, DLCO 57% with volume correction to 62% of predicted NPSG 06/21/85- AHI 98/hour with desaturation to 64% BiPAP titration study-21/17, PS 4 cwp O2 2L sleep, with residual AHI 52 "unknown" events, either obstructive or central and presumably mostly RERAs.  ------------------------   12/28/2016- 76 year old male former smoker followed for OSA, complicated by morbid obesity, aortic stenosis/aVR, osteoarthritis BiPAP 21/17, PS 4   O2 2 L/sleep /Advanced FOLLOWS FOR: DME:[facility] Pt wears BiPAP nightly. DL attached. . Pt uses O2  at night with BiPAP. He wants to continue current mask. He feels better with BiPAP than without.  He sits watching TV all day; admits catnaps intermittently especially when room is warm. Significant leak around 53L/min.  He notices feeling a little more sleepy in the daytime, falling asleep in his TV chair.   07/04/17- 76 year old male former smoker followed for OSA, complicated by morbid obesity, aortic stenosis/aVR, osteoarthritis BiPAP 23/17, PS 4/ O2 2L sleep/Advanced OSA; DME: [facility]. Pt wears BiPAP nightly. DL attached.  Wife here today says he does use BiPAP compliantly and it mostly prevents snoring. Download 100% compliance averaging over 8 hours per night, with oxygen bleed in. He is not getting adequate control-residual AHI 84.7/hour. There is some leak at these high pressures. We discussed possible rental of a portable oxygen concentrator to provide oxygen if he travels. He tried sleeping off of oxygen and felt the difference-sleeps better with it. He chose not to try Ritalin for daytime sleepiness and still dozes off and on during the  daytime as he sits watching TV.  ROS- see HPI  + = positive Constitutional:   No-   weight loss, night sweats, fevers, chills, +fatigue, lassitude. HEENT:   No-  headaches, difficulty swallowing, tooth/dental problems, sore throat,       No-  sneezing, itching, ear ache, nasal congestion, post nasal drip,  CV:  No-   chest pain, orthopnea, PND, swelling in lower extremities, anasarca,  dizziness, palpitations Resp: +shortness of breath with exertion or at rest.              No-   productive cough,  No non-productive cough,  No- coughing up of blood.              No-   change in color of mucus.  No- wheezing.   Skin: No-   rash or lesions. GI:  No-   heartburn, indigestion, abdominal pain, nausea, vomiting, GU:  MS:  No-   joint pain or swelling.   Neuro-     nothing unusual Psych:  No- change in mood or affect. No depression or anxiety.  No memory loss.  OBJ General- Alert, Oriented, Affect-appropriate, Distress- none acute. +Morbidly obese, +power wheelchair  Skin- rash-none, lesions- none, excoriation- none Lymphadenopathy- none Head- atraumatic            Eyes- Gross vision intact, PERRLA, conjunctivae clear secretions            Ears- Hearing, canals-normal            Nose- Clear, no-Septal dev, mucus, polyps, erosion, perforation             Throat- Mallampati III-IV , mucosa clear , drainage- none, tonsils- atrophic Neck- flexible , trachea midline, no  stridor , thyroid nl, carotid no bruit Chest - symmetrical excursion , unlabored           Heart/CV- RRR , murmur-none, no gallop, no rub, nl s1 s2                           - JVD- none , edema+, stasis changes+, varices- none           Lung- clear to P&A, wheeze- none, cough- none , dullness-none, rub- none, no valve click heard                           Room air saturation while awake and sitting upright on arrival today 91%           Chest wall-  Abd-  Br/ Gen/ Rectal- Not done, not indicated Extrem- cyanosis- none,  clubbing, none, atrophy- none, strength- nl Neuro- grossly intact to observation, alert and pleasant

## 2017-07-05 ENCOUNTER — Telehealth: Payer: Self-pay | Admitting: Internal Medicine

## 2017-07-05 NOTE — Telephone Encounter (Signed)
Called and spoke with [name] with [facility] and she stated that she did review the pts order and insurance and that he will qualify for the NIV.  The only thing that they need is for CY to addend his note and state in that addendum why his BIPAP is not working for the pt and how the pt would benefit from the NIV.  CY please advise. Thanks

## 2017-07-06 DIAGNOSIS — H5203 Hypermetropia, bilateral: Secondary | ICD-10-CM | POA: Diagnosis not present

## 2017-07-06 DIAGNOSIS — H04123 Dry eye syndrome of bilateral lacrimal glands: Secondary | ICD-10-CM | POA: Diagnosis not present

## 2017-07-06 DIAGNOSIS — H52223 Regular astigmatism, bilateral: Secondary | ICD-10-CM | POA: Diagnosis not present

## 2017-07-06 DIAGNOSIS — H524 Presbyopia: Secondary | ICD-10-CM | POA: Diagnosis not present

## 2017-07-06 NOTE — Assessment & Plan Note (Deleted)
Download again shows that we are just not controlling his obstructive sleep apnea and obesity hypoventilation with BiPAP at high pressures.  We need to change BiPAP to noninvasive ventilatory support for sleep. Plan-ordered DME change BiPAP to NIV for dx OSA, OHS, chronic resp failure with hypoxia.

## 2017-07-06 NOTE — Assessment & Plan Note (Signed)
Download again shows that we are just not controlling his obstructive sleep apnea and obesity hypoventilation with BiPAP at high pressures.  We need to change BiPAP to noninvasive ventilatory support for sleep. Plan-ordered DME change BiPAP to NIV for dx OSA, OHS, chronic resp failure with hypoxia.

## 2017-07-06 NOTE — Assessment & Plan Note (Signed)
He is huge and this immobilizes him. He is dependent on power wheelchair and sits most of each day watching TV. He has been offered  bariatric referral

## 2017-07-08 NOTE — Telephone Encounter (Signed)
Forms for NIV have been filled out and placed in [facility] folder at [facility] area. Nothing more needed at this time.

## 2017-07-16 DIAGNOSIS — G4733 Obstructive sleep apnea (adult) (pediatric): Secondary | ICD-10-CM | POA: Diagnosis not present

## 2017-07-16 DIAGNOSIS — R0602 Shortness of breath: Secondary | ICD-10-CM | POA: Diagnosis not present

## 2017-07-18 ENCOUNTER — Other Ambulatory Visit: Payer: Self-pay | Admitting: Family Medicine

## 2017-07-19 DIAGNOSIS — G4733 Obstructive sleep apnea (adult) (pediatric): Secondary | ICD-10-CM | POA: Diagnosis not present

## 2017-07-22 DIAGNOSIS — G4733 Obstructive sleep apnea (adult) (pediatric): Secondary | ICD-10-CM | POA: Diagnosis not present

## 2017-07-22 DIAGNOSIS — J9622 Acute and chronic respiratory failure with hypercapnia: Secondary | ICD-10-CM | POA: Diagnosis not present

## 2017-07-22 DIAGNOSIS — R0602 Shortness of breath: Secondary | ICD-10-CM | POA: Diagnosis not present

## 2017-07-22 DIAGNOSIS — J449 Chronic obstructive pulmonary disease, unspecified: Secondary | ICD-10-CM | POA: Diagnosis not present

## 2017-07-26 ENCOUNTER — Telehealth: Payer: Self-pay | Admitting: Family Medicine

## 2017-07-26 ENCOUNTER — Ambulatory Visit: Payer: PPO | Admitting: Family Medicine

## 2017-07-26 NOTE — Telephone Encounter (Signed)
No charge. 

## 2017-07-26 NOTE — Telephone Encounter (Signed)
Pt called in to reschedule apt. He says that he is unable to make it because his spouse isn't feeling well enough to bring him.   Pt rescheduled his apt to 07/28/17 at 3:30.

## 2017-07-28 ENCOUNTER — Encounter: Payer: Self-pay | Admitting: Family Medicine

## 2017-07-28 ENCOUNTER — Ambulatory Visit: Payer: PPO | Admitting: Family Medicine

## 2017-07-28 DIAGNOSIS — D649 Anemia, unspecified: Secondary | ICD-10-CM | POA: Diagnosis not present

## 2017-07-28 DIAGNOSIS — E7800 Pure hypercholesterolemia, unspecified: Secondary | ICD-10-CM

## 2017-07-28 DIAGNOSIS — R739 Hyperglycemia, unspecified: Secondary | ICD-10-CM | POA: Insufficient documentation

## 2017-07-28 DIAGNOSIS — M539 Dorsopathy, unspecified: Secondary | ICD-10-CM

## 2017-07-28 DIAGNOSIS — M1A9XX0 Chronic gout, unspecified, without tophus (tophi): Secondary | ICD-10-CM

## 2017-07-28 DIAGNOSIS — G4733 Obstructive sleep apnea (adult) (pediatric): Secondary | ICD-10-CM

## 2017-07-28 NOTE — Assessment & Plan Note (Signed)
Encouraged moist heat and gentle stretching as tolerated. May try NSAIDs and prescription meds as directed and report if symptoms worsen or seek immediate care. Is now following with pain management and he responded well to a steroid shot given by Dr Maryjean Ka

## 2017-07-28 NOTE — Assessment & Plan Note (Signed)
Increase leafy greens, consider increased lean red meat and using cast iron cookware. Continue to monitor, report any concerns 

## 2017-07-28 NOTE — Progress Notes (Signed)
Subjective:  I acted as a Education administrator for [employer]. Yancey Flemings, [place]   Patient ID: [name], male    DOB: [birth year], 76 y.o.   MRN: [id number]  Chief Complaint  Patient presents with  . Follow-up    HPI  Patient is in today for follow up visit and he is feeling well. No recent febrile illness or hospitalizations. He is noting some ongoing trouble with right wrist paina nd ongoing back pain. Also notes persistent pedal edema. He has been switched to bipap and is sleeping better. Denies CP/palp/SOB/HA/congestion/fevers/GI or GU c/o. Taking meds as prescribed  Patient Care Team: [name], MD as PCP - General (Family Medicine) Josue Hector, MD as Consulting Physician (Cardiology)  Poorly controlled will alter medications, encouraged DASH diet, minimize caffeine and obtain adequate sleep. Report concerning symptoms and follow up as directed and as needed  Past Medical History:  Diagnosis Date  . Anemia   . Aortic stenosis   . Aortic stenosis, severe    S/p Edwards Sapien 3 Transcatheter Heart Valve (size 26 mm, model # [id number], serial # [id number])  . Arthritis   . Bursitis   . Cataract    left immature  . Cellulitis 10/12/2015  . Complication of anesthesia    Halucinations  . Constipation    takes Miralax daily as well as Senokot daily  . DDD (degenerative disc disease)   . Gout    takes Allopurinol daily  . History of blood clots 1962   knee  . History of blood transfusion    no abnormal reaction noted  . History of shingles   . Hyperlipidemia    takes Atorvastatin daily  . Hypertension    takes Lisinopril daily  . Low back pain 01/25/2017  . Neck pain on left side 01/25/2017  . OSA (obstructive sleep apnea)   . Osteoarthritis   . Peripheral edema    takes Furosemide daily  . Peripheral neuropathy    takes Gabapentin daily  . Peroneal palsy    significant right foot drop  . Pneumonia 25+yrs ago   hx of  . Urinary frequency   . Urinary urgency   . Valvular  heart disease     Past Surgical History:  Procedure Laterality Date  . HERNIA REPAIR     umbilical hernia  . KNEE SURGERY Right    multiple knee surgeries due to complication of R TKA  . LAMINOTOMY  3329   c6-t2  . SPINE SURGERY    . TOTAL KNEE ARTHROPLASTY     right    Family History  Problem Relation Age of Onset  . Arthritis Sister        "crippling"  . Arthritis Sister     Social History   Socioeconomic History  . Marital status: Married    Spouse name: Not on file  . Number of children: Not on file  . Years of education: Not on file  . Highest education level: Not on file  Social Needs  . Financial resource strain: Not on file  . Food insecurity - worry: Not on file  . Food insecurity - inability: Not on file  . Transportation needs - medical: Not on file  . Transportation needs - non-medical: Not on file  Occupational History  . Occupation: retired Nurse, mental health  . Smoking status: Former Smoker    Years: 30.00    Types: Pipe    Last attempt to quit: 09/20/2006  Years since quitting: 10.8  . Smokeless tobacco: Never Used  Substance and Sexual Activity  . Alcohol use: No    Alcohol/week: 0.0 oz  . Drug use: No  . Sexual activity: No    Comment: lives with wife, retired, no dietary restrictions  Other Topics Concern  . Not on file  Social History Narrative  . Not on file    Outpatient Medications Prior to Visit  Medication Sig Dispense Refill  . acetaminophen (TYLENOL) 500 MG tablet Take 500 mg by mouth 2 (two) times daily.     [name] aspirin 81 MG EC tablet TAKE 1 TABLET (81 MG TOTAL) BY MOUTH DAILY. 30 tablet 11  . atorvastatin (LIPITOR) 40 MG tablet TAKE 1 TABLET BY MOUTH EVERY DAY (Patient taking differently: take 1 and 1/2 tablet every day.) 90 tablet 2  . cholecalciferol (VITAMIN D) 1000 UNITS tablet Take 2,000 Units by mouth daily.     . clopidogrel (PLAVIX) 75 MG tablet TAKE 1 TABLET (75 MG TOTAL) BY MOUTH DAILY WITH BREAKFAST. 90  tablet 3  . Coenzyme Q10 (COQ10) 200 MG CAPS Take 200 mg by mouth daily.    . furosemide (LASIX) 20 MG tablet TAKE 1 TABLET BY MOUTH TWICE A DAY 180 tablet 3  . gabapentin (NEURONTIN) 300 MG capsule TAKE 1 CAPSULE (300 MG TOTAL) BY MOUTH 2 (TWO) TIMES DAILY. 180 capsule 0  . lisinopril (PRINIVIL,ZESTRIL) 10 MG tablet TAKE 1 TABLET BY MOUTH EVERY DAY 90 tablet 0  . Multiple Vitamin (MULTIVITAMIN WITH MINERALS) TABS Take 1 tablet by mouth daily.    . polyethylene glycol (MIRALAX / GLYCOLAX) packet Take 17 g by mouth daily.    . sennosides-docusate sodium (SENOKOT-S) 8.6-50 MG tablet Take 1 tablet by mouth 2 (two) times daily.    . traMADol (ULTRAM) 50 MG tablet TAKE 1 TABLET EVERY 6 HOURS AS NEEDED 90 tablet 0  . allopurinol (ZYLOPRIM) 300 MG tablet TAKE 1 TABLET (300 MG TOTAL) BY MOUTH DAILY. 30 tablet 0   No facility-administered medications prior to visit.     Allergies  Allergen Reactions  . Coumadin [Warfarin Sodium] Other (See Comments)    States he can't be on this-bleeds out    Review of Systems  Constitutional: Negative for fever and malaise/fatigue.  HENT: Negative for congestion.   Respiratory: Negative for cough and shortness of breath.   Cardiovascular: Positive for leg swelling. Negative for chest pain and palpitations.  Gastrointestinal: Negative for vomiting.  Musculoskeletal: Positive for back pain and joint pain.  Skin: Negative for rash.  Neurological: Negative for loss of consciousness and headaches.       Objective:    Physical Exam  Constitutional: He is oriented to person, place, and time. He appears well-developed and well-nourished. No distress.  HENT:  Head: Normocephalic and atraumatic.  Nose: Nose normal.  Eyes: Right eye exhibits no discharge. Left eye exhibits no discharge.  Neck: Normal range of motion. Neck supple.  Cardiovascular: Normal rate and regular rhythm.  Murmur heard. Pulmonary/Chest: Effort normal and breath sounds normal.    Abdominal: Soft. Bowel sounds are normal. There is no tenderness.  Musculoskeletal: He exhibits edema.  Neurological: He is alert and oriented to person, place, and time.  Skin: Skin is warm and dry.  Psychiatric: He has a normal mood and affect.  Nursing note and vitals reviewed.   BP (!) 110/58   Pulse 74   Temp 98.3 F (36.8 C) (Oral)   Resp 16   Wt [name])  420 lb (190.5 kg)   SpO2 95%   BMI 53.92 kg/m  Wt Readings from Last 3 Encounters:  07/28/17 (!) 420 lb (190.5 kg)  02/04/17 (!) 400 lb (181.4 kg)  01/25/17 (!) 400 lb (181.4 kg)   BP Readings from Last 3 Encounters:  07/28/17 (!) 110/58  07/04/17 118/76  05/30/17 110/80     Immunization History  Administered Date(s) Administered  . Influenza Split 07/08/2011, 06/20/2012  . Influenza, High Dose Seasonal PF 06/22/2016  . Influenza,inj,Quad PF,6+ Mos 06/07/2013, 07/29/2014, 07/18/2015  . Influenza-Unspecified 06/26/2017  . PPD Test 02/16/2013  . Pneumococcal Conjugate-13 10/11/2013  . Pneumococcal Polysaccharide-23 11/07/2015  . Td 08/26/2006  . Zoster 07/27/2013    Health Maintenance  Topic Date Due  . Samul Dada  08/26/2016  . COLONOSCOPY  02/10/2023  . INFLUENZA VACCINE  Completed  . PNA vac Low Risk Adult  Completed    Lab Results  Component Value Date   WBC 8.1 07/28/2017   HGB 11.8 (L) 07/28/2017   HCT 37.5 (L) 07/28/2017   PLT 151.0 07/28/2017   GLUCOSE 113 (H) 07/28/2017   CHOL 138 07/28/2017   TRIG 301.0 (H) 07/28/2017   HDL 23.00 (L) 07/28/2017   LDLDIRECT 85.0 07/28/2017   LDLCALC 71 01/25/2017   ALT 16 07/28/2017   AST 18 07/28/2017   NA 138 07/28/2017   K 5.2 (H) 07/28/2017   CL 99 07/28/2017   CREATININE 1.39 07/28/2017   BUN 40 (H) 07/28/2017   CO2 34 (H) 07/28/2017   TSH 1.30 07/28/2017   INR 1.32 06/10/2015   HGBA1C 6.0 07/28/2017    Lab Results  Component Value Date   TSH 1.30 07/28/2017   Lab Results  Component Value Date   WBC 8.1 07/28/2017   HGB 11.8 (L)  07/28/2017   HCT 37.5 (L) 07/28/2017   MCV 97.8 07/28/2017   PLT 151.0 07/28/2017   Lab Results  Component Value Date   NA 138 07/28/2017   K 5.2 (H) 07/28/2017   CO2 34 (H) 07/28/2017   GLUCOSE 113 (H) 07/28/2017   BUN 40 (H) 07/28/2017   CREATININE 1.39 07/28/2017   BILITOT 0.3 07/28/2017   ALKPHOS 70 07/28/2017   AST 18 07/28/2017   ALT 16 07/28/2017   PROT 6.4 07/28/2017   ALBUMIN 3.6 07/28/2017   CALCIUM 9.5 07/28/2017   ANIONGAP 6 02/04/2017   GFR 52.95 (L) 07/28/2017   Lab Results  Component Value Date   CHOL 138 07/28/2017   Lab Results  Component Value Date   HDL 23.00 (L) 07/28/2017   Lab Results  Component Value Date   LDLCALC 71 01/25/2017   Lab Results  Component Value Date   TRIG 301.0 (H) 07/28/2017   Lab Results  Component Value Date   CHOLHDL 6 07/28/2017   Lab Results  Component Value Date   HGBA1C 6.0 07/28/2017         Assessment & Plan:   Problem List Items Addressed This Visit    HYPERCHOLESTEROLEMIA    Tolerating statin, encouraged heart healthy diet, avoid trans fats, minimize simple carbs and saturated fats. Increase exercise as tolerated      Relevant Orders   Lipid panel (Completed)   TSH (Completed)   Gout    Check uric acid today      Relevant Orders   Uric acid (Completed)   OBESITY, MORBID    Encouraged DASH diet, decrease po intake and increase exercise as tolerated. Needs 7-8 hours of sleep nightly. Avoid  trans fats, eat small, frequent meals every 4-5 hours with lean proteins, complex carbs and healthy fats. Minimize simple carbs      Anemia    Increase leafy greens, consider increased lean red meat and using cast iron cookware. Continue to monitor, report any concerns      Relevant Orders   CBC with Differential/Platelet (Completed)   Obstructive sleep apnea    Is doing better on Bipap and sleeping well will continue to follow with pulmonology      Multilevel degenerative disc disease    Encouraged  moist heat and gentle stretching as tolerated. May try NSAIDs and prescription meds as directed and report if symptoms worsen or seek immediate care. Is now following with pain management and he responded well to a steroid shot given by Dr Maryjean Ka      Hyperglycemia    minimize simple carbs. Increase exercise as tolerated.       Relevant Orders   Hemoglobin A1c (Completed)   Comprehensive metabolic panel (Completed)      I am having [place] D. Helbling maintain his cholecalciferol, sennosides-docusate sodium, multivitamin with minerals, acetaminophen, CoQ10, polyethylene glycol, aspirin, furosemide, atorvastatin, clopidogrel, traMADol, gabapentin, and lisinopril.  No orders of the defined types were placed in this encounter.   .team Penni Homans, MD

## 2017-07-28 NOTE — Assessment & Plan Note (Signed)
Tolerating statin, encouraged heart healthy diet, avoid trans fats, minimize simple carbs and saturated fats. Increase exercise as tolerated 

## 2017-07-28 NOTE — Assessment & Plan Note (Signed)
minimize simple carbs. Increase exercise as tolerated.  

## 2017-07-28 NOTE — Assessment & Plan Note (Signed)
Check uric acid today 

## 2017-07-28 NOTE — Patient Instructions (Addendum)
Consider a lidocaine gel made by [employer]percre[employer], icy hot and salon pas   Gout Gout is painful swelling that can happen in some of your joints. Gout is a type of arthritis. This condition is caused by having too much uric acid in your body. Uric acid is a chemical that is made when your body breaks down substances called purines. If your body has too much uric acid, sharp crystals can form and build up in your joints. This causes pain and swelling. Gout attacks can happen quickly and be very painful (acute gout). Over time, the attacks can affect more joints and happen more often (chronic gout). Follow these instructions at home: During a Gout Attack  If directed, put ice on the painful area: ? Put ice in a plastic bag. ? Place a towel between your skin and the bag. ? Leave the ice on for 20 minutes, 2-3 times a day.  Rest the joint as much as possible. If the joint is in your leg, you may be given crutches to use.  Raise (elevate) the painful joint above the level of your heart as often as you can.  Drink enough fluids to keep your pee (urine) clear or pale yellow.  Take over-the-counter and prescription medicines only as told by your doctor.  Do not drive or use heavy machinery while taking prescription pain medicine.  Follow instructions from your doctor about what you can or cannot eat and drink.  Return to your normal activities as told by your doctor. Ask your doctor what activities are safe for you. Avoiding Future Gout Attacks  Follow a low-purine diet as told by a specialist (dietitian) or your doctor. Avoid foods and drinks that have a lot of purines, such as: ? Liver. ? Kidney. ? Anchovies. ? Asparagus. ? Herring. ? Mushrooms ? Mussels. ? Beer.  Limit alcohol intake to no more than 1 drink a day for nonpregnant women and 2 drinks a day for men. One drink equals 12 oz of beer, 5 oz of wine, or 1 oz of hard liquor.  Stay at a healthy weight or lose weight if you are  overweight. If you want to lose weight, talk with your doctor. It is important that you do not lose weight too fast.  Start or continue an exercise plan as told by your doctor.  Drink enough fluids to keep your pee clear or pale yellow.  Take over-the-counter and prescription medicines only as told by your doctor.  Keep all follow-up visits as told by your doctor. This is important. Contact a doctor if:  You have another gout attack.  You still have symptoms of a gout attack after10 days of treatment.  You have problems (side effects) because of your medicines.  You have chills or a fever.  You have burning pain when you pee (urinate).  You have pain in your lower back or belly. Get help right away if:  You have very bad pain.  Your pain cannot be controlled.  You cannot pee. This information is not intended to replace advice given to you by your health care provider. Make sure you discuss any questions you have with your health care provider. Document Released: 06/15/2008 Document Revised: 02/12/2016 Document Reviewed: 06/19/2015 Elsevier Interactive Patient Education  [employer].

## 2017-07-29 LAB — URIC ACID: Uric Acid, Serum: 6.6 mg/dL (ref 4.0–7.8)

## 2017-07-29 LAB — COMPREHENSIVE METABOLIC PANEL
ALT: 16 U/L (ref 0–53)
AST: 18 U/L (ref 0–37)
Albumin: 3.6 g/dL (ref 3.5–5.2)
Alkaline Phosphatase: 70 U/L (ref 39–117)
BUN: 40 mg/dL — ABNORMAL HIGH (ref 6–23)
CO2: 34 mEq/L — ABNORMAL HIGH (ref 19–32)
Calcium: 9.5 mg/dL (ref 8.4–10.5)
Chloride: 99 mEq/L (ref 96–112)
Creatinine, Ser: 1.39 mg/dL (ref 0.40–1.50)
GFR: 52.95 mL/min — ABNORMAL LOW (ref >60.00)
Glucose, Bld: 113 mg/dL — ABNORMAL HIGH (ref 70–99)
Potassium: 5.2 mEq/L — ABNORMAL HIGH (ref 3.5–5.1)
Sodium: 138 mEq/L (ref 135–145)
Total Bilirubin: 0.3 mg/dL (ref 0.2–1.2)
Total Protein: 6.4 g/dL (ref 6.0–8.3)

## 2017-07-29 LAB — LDL CHOLESTEROL, DIRECT: Direct LDL: 85 mg/dL

## 2017-07-29 LAB — CBC WITH DIFFERENTIAL/PLATELET
Basophils Absolute: 0.1 10*3/uL (ref 0.0–0.1)
Basophils Relative: 1.3 % (ref 0.0–3.0)
Eosinophils Absolute: 0.1 10*3/uL (ref 0.0–0.7)
Eosinophils Relative: 1.6 % (ref 0.0–5.0)
HCT: 37.5 % — ABNORMAL LOW (ref 39.0–52.0)
Hemoglobin: 11.8 g/dL — ABNORMAL LOW (ref 13.0–17.0)
Lymphocytes Relative: 16.6 % (ref 12.0–46.0)
Lymphs Abs: 1.3 10*3/uL (ref 0.7–4.0)
MCHC: 31.4 g/dL (ref 30.0–36.0)
MCV: 97.8 fl (ref 78.0–100.0)
Monocytes Absolute: 0.5 10*3/uL (ref 0.1–1.0)
Monocytes Relative: 6.5 % (ref 3.0–12.0)
Neutro Abs: 6 10*3/uL (ref 1.4–7.7)
Neutrophils Relative %: 74 % (ref 43.0–77.0)
Platelets: 151 10*3/uL (ref 150.0–400.0)
RBC: 3.83 Mil/uL — ABNORMAL LOW (ref 4.22–5.81)
RDW: 17.8 % — ABNORMAL HIGH (ref 11.5–15.5)
WBC: 8.1 10*3/uL (ref 4.0–10.5)

## 2017-07-29 LAB — LIPID PANEL
Cholesterol: 138 mg/dL (ref 0–200)
HDL: 23 mg/dL — ABNORMAL LOW (ref >39.00)
NonHDL: 114.91
Total CHOL/HDL Ratio: 6
Triglycerides: 301 mg/dL — ABNORMAL HIGH (ref 0.0–149.0)
VLDL: 60.2 mg/dL — ABNORMAL HIGH (ref 0.0–40.0)

## 2017-07-29 LAB — HEMOGLOBIN A1C: Hgb A1c MFr Bld: 6 % (ref 4.6–6.5)

## 2017-07-29 LAB — TSH: TSH: 1.3 u[IU]/mL (ref 0.35–4.50)

## 2017-07-31 ENCOUNTER — Other Ambulatory Visit: Payer: Self-pay | Admitting: Family Medicine

## 2017-08-01 ENCOUNTER — Other Ambulatory Visit: Payer: Self-pay | Admitting: Family Medicine

## 2017-08-01 MED ORDER — FERROUS FUMARATE-FOLIC ACID 324-1 MG PO TABS: 1.0000 | ORAL_TABLET | Freq: Every day | ORAL | 3 refills | Status: DC

## 2017-08-01 NOTE — Assessment & Plan Note (Signed)
Is doing better on Bipap and sleeping well will continue to follow with pulmonology

## 2017-08-01 NOTE — Assessment & Plan Note (Signed)
Encouraged DASH diet, decrease po intake and increase exercise as tolerated. Needs 7-8 hours of sleep nightly. Avoid trans fats, eat small, frequent meals every 4-5 hours with lean proteins, complex carbs and healthy fats. Minimize simple carbs 

## 2017-08-16 ENCOUNTER — Telehealth: Payer: Self-pay | Admitting: Family Medicine

## 2017-08-16 DIAGNOSIS — R0602 Shortness of breath: Secondary | ICD-10-CM | POA: Diagnosis not present

## 2017-08-16 DIAGNOSIS — G4733 Obstructive sleep apnea (adult) (pediatric): Secondary | ICD-10-CM | POA: Diagnosis not present

## 2017-08-16 NOTE — Telephone Encounter (Signed)
Copied from [place]. Topic: Quick Communication - See Telephone Encounter >> Aug 16, 2017  9:06 AM Ether Griffins B wrote: CRM for notification. See Telephone encounter for:  Pt has skin infection on knee. Pt states he had the same thing last year and would like something called in.  08/16/17.

## 2017-08-17 ENCOUNTER — Encounter: Payer: Self-pay | Admitting: Physician Assistant

## 2017-08-17 ENCOUNTER — Ambulatory Visit: Payer: PPO | Admitting: Physician Assistant

## 2017-08-17 VITALS — BP 110/62 | HR 57 | Temp 97.9°F | Resp 16

## 2017-08-17 DIAGNOSIS — L03116 Cellulitis of left lower limb: Secondary | ICD-10-CM

## 2017-08-17 DIAGNOSIS — L02416 Cutaneous abscess of left lower limb: Secondary | ICD-10-CM | POA: Diagnosis not present

## 2017-08-17 MED ORDER — CEPHALEXIN 500 MG PO CAPS: 500.0000 mg | ORAL_CAPSULE | Freq: Two times a day (BID) | ORAL | 0 refills | Status: AC

## 2017-08-17 NOTE — Progress Notes (Signed)
Patient with morbid obesity, chronic venous stasis dermatitis without ulceration presents to clinic today c/o redness and warmth of left lateral lower leg starting yesterday. States he wears a brace that occasionally rubs his legs when it is not fitted appropriately. Has history of cellulitis in this area previously needing treatment with ABX. Was concerned about symptoms progressing and came in for evaluation. Denies fever, chills, malaise or fatigue. Denies increased swelling. Notes symptoms are better today than yesterday.  Past Medical History:  Diagnosis Date  . Anemia   . Aortic stenosis   . Aortic stenosis, severe    S/p Edwards Sapien 3 Transcatheter Heart Valve (size 26 mm, model # [id number], serial # [id number])  . Arthritis   . Bursitis   . Cataract    left immature  . Cellulitis 10/12/2015  . Complication of anesthesia    Halucinations  . Constipation    takes Miralax daily as well as Senokot daily  . DDD (degenerative disc disease)   . Gout    takes Allopurinol daily  . History of blood clots 1962   knee  . History of blood transfusion    no abnormal reaction noted  . History of shingles   . Hyperlipidemia    takes Atorvastatin daily  . Hypertension    takes Lisinopril daily  . Low back pain 01/25/2017  . Neck pain on left side 01/25/2017  . OSA (obstructive sleep apnea)   . Osteoarthritis   . Peripheral edema    takes Furosemide daily  . Peripheral neuropathy    takes Gabapentin daily  . Peroneal palsy    significant right foot drop  . Pneumonia 25+yrs ago   hx of  . Urinary frequency   . Urinary urgency   . Valvular heart disease     Current Outpatient Medications on File Prior to Visit  Medication Sig Dispense Refill  . acetaminophen (TYLENOL) 500 MG tablet Take 500 mg by mouth 2 (two) times daily.     [name] allopurinol (ZYLOPRIM) 300 MG tablet TAKE 1 TABLET (300 MG TOTAL) BY MOUTH DAILY. 30 tablet 0  . aspirin 81 MG EC tablet TAKE 1 TABLET (81 MG TOTAL) BY  MOUTH DAILY. 30 tablet 11  . atorvastatin (LIPITOR) 40 MG tablet TAKE 1 TABLET BY MOUTH EVERY DAY (Patient taking differently: take 1 and 1/2 tablet every day.) 90 tablet 2  . cholecalciferol (VITAMIN D) 1000 UNITS tablet Take 2,000 Units by mouth daily.     . clopidogrel (PLAVIX) 75 MG tablet TAKE 1 TABLET (75 MG TOTAL) BY MOUTH DAILY WITH BREAKFAST. 90 tablet 3  . Coenzyme Q10 (COQ10) 200 MG CAPS Take 200 mg by mouth daily.    . Ferrous Fumarate-Folic Acid (HEMOCYTE-F) 324-1 MG TABS Take 1 tablet daily by mouth. 30 each 3  . furosemide (LASIX) 20 MG tablet TAKE 1 TABLET BY MOUTH TWICE A DAY 180 tablet 3  . gabapentin (NEURONTIN) 300 MG capsule TAKE 1 CAPSULE (300 MG TOTAL) BY MOUTH 2 (TWO) TIMES DAILY. 180 capsule 0  . lisinopril (PRINIVIL,ZESTRIL) 10 MG tablet TAKE 1 TABLET BY MOUTH EVERY DAY 90 tablet 0  . Multiple Vitamin (MULTIVITAMIN WITH MINERALS) TABS Take 1 tablet by mouth daily.    . polyethylene glycol (MIRALAX / GLYCOLAX) packet Take 17 g by mouth daily.    . sennosides-docusate sodium (SENOKOT-S) 8.6-50 MG tablet Take 1 tablet by mouth 2 (two) times daily.    . traMADol (ULTRAM) 50 MG tablet TAKE 1 TABLET EVERY 6  HOURS AS NEEDED 90 tablet 0   No current facility-administered medications on file prior to visit.     Allergies  Allergen Reactions  . Coumadin [Warfarin Sodium] Other (See Comments)    States he can't be on this-bleeds out    Family History  Problem Relation Age of Onset  . Arthritis Sister        "crippling"  . Arthritis Sister     Social History   Socioeconomic History  . Marital status: Married    Spouse name: None  . Number of children: None  . Years of education: None  . Highest education level: None  Social Needs  . Financial resource strain: None  . Food insecurity - worry: None  . Food insecurity - inability: None  . Transportation needs - medical: None  . Transportation needs - non-medical: None  Occupational History  . Occupation:  retired Nurse, mental health  . Smoking status: Former Smoker    Years: 30.00    Types: Pipe    Last attempt to quit: 09/20/2006    Years since quitting: 10.9  . Smokeless tobacco: Never Used  Substance and Sexual Activity  . Alcohol use: No    Alcohol/week: 0.0 oz  . Drug use: No  . Sexual activity: No    Comment: lives with wife, retired, no dietary restrictions  Other Topics Concern  . None  Social History Narrative  . None   Review of Systems - See HPI.  All other ROS are negative.  BP 110/62   Pulse (!) 57   Temp 97.9 F (36.6 C) (Oral)   Resp 16   SpO2 92%   Physical Exam  Constitutional: He is oriented to person, place, and time and well-developed, well-nourished, and in no distress.  HENT:  Head: Normocephalic and atraumatic.  Eyes: Conjunctivae are normal.  Neck: Neck supple.  Cardiovascular: Normal rate, regular rhythm, normal heart sounds and intact distal pulses.  Pulmonary/Chest: Effort normal and breath sounds normal. No respiratory distress. He has no wheezes. He has no rales. He exhibits no tenderness.  Neurological: He is alert and oriented to person, place, and time.  Skin: Skin is warm and dry.     Psychiatric: Affect normal.  Vitals reviewed.   Recent Results (from the past 2160 hour(s))  CBC with Differential/Platelet     Status: Abnormal   Collection Time: 07/28/17  4:28 PM  Result Value Ref Range   WBC 8.1 4.0 - 10.5 K/uL   RBC 3.83 (L) 4.22 - 5.81 Mil/uL   Hemoglobin 11.8 (L) 13.0 - 17.0 g/dL   HCT 37.5 (L) 39.0 - 52.0 %   MCV 97.8 78.0 - 100.0 fl   MCHC 31.4 30.0 - 36.0 g/dL   RDW 17.8 (H) 11.5 - 15.5 %   Platelets 151.0 150.0 - 400.0 K/uL   Neutrophils Relative % 74.0 43.0 - 77.0 %   Lymphocytes Relative 16.6 12.0 - 46.0 %   Monocytes Relative 6.5 3.0 - 12.0 %   Eosinophils Relative 1.6 0.0 - 5.0 %   Basophils Relative 1.3 0.0 - 3.0 %   Neutro Abs 6.0 1.4 - 7.7 K/uL   Lymphs Abs 1.3 0.7 - 4.0 K/uL   Monocytes Absolute 0.5 0.1  - 1.0 K/uL   Eosinophils Absolute 0.1 0.0 - 0.7 K/uL   Basophils Absolute 0.1 0.0 - 0.1 K/uL  Uric acid     Status: None   Collection Time: 07/28/17  4:28 PM  Result Value Ref  Range   Uric Acid, Serum 6.6 4.0 - 7.8 mg/dL  Hemoglobin A1c     Status: None   Collection Time: 07/28/17  4:28 PM  Result Value Ref Range   Hgb A1c MFr Bld 6.0 4.6 - 6.5 %    Comment: Glycemic Control Guidelines for People with Diabetes:Non Diabetic:  <6%Goal of Therapy: <7%Additional Action Suggested:  >8%   Comprehensive metabolic panel     Status: Abnormal   Collection Time: 07/28/17  4:28 PM  Result Value Ref Range   Sodium 138 135 - 145 mEq/L   Potassium 5.2 (H) 3.5 - 5.1 mEq/L   Chloride 99 96 - 112 mEq/L   CO2 34 (H) 19 - 32 mEq/L   Glucose, Bld 113 (H) 70 - 99 mg/dL   BUN 40 (H) 6 - 23 mg/dL   Creatinine, Ser 1.39 0.40 - 1.50 mg/dL   Total Bilirubin 0.3 0.2 - 1.2 mg/dL   Alkaline Phosphatase 70 39 - 117 U/L   AST 18 0 - 37 U/L   ALT 16 0 - 53 U/L   Total Protein 6.4 6.0 - 8.3 g/dL   Albumin 3.6 3.5 - 5.2 g/dL   Calcium 9.5 8.4 - 10.5 mg/dL   GFR 52.95 (L) >60.00 mL/min  Lipid panel     Status: Abnormal   Collection Time: 07/28/17  4:28 PM  Result Value Ref Range   Cholesterol 138 0 - 200 mg/dL    Comment: ATP III Classification       Desirable:  < 200 mg/dL               Borderline High:  200 - 239 mg/dL          High:  > = 240 mg/dL   Triglycerides 301.0 (H) 0.0 - 149.0 mg/dL    Comment: Normal:  <150 mg/dLBorderline High:  150 - 199 mg/dL   HDL 23.00 (L) >39.00 mg/dL   VLDL 60.2 (H) 0.0 - 40.0 mg/dL   Total CHOL/HDL Ratio 6     Comment:                Men          Women1/2 Average Risk     3.4          3.3Average Risk          5.0          4.42X Average Risk          9.6          7.13X Average Risk          15.0          11.0                       NonHDL 114.91     Comment: NOTE:  Non-HDL goal should be 30 mg/dL higher than patient's LDL goal (i.e. LDL goal of < 70 mg/dL, would have non-HDL  goal of < 100 mg/dL)  TSH     Status: None   Collection Time: 07/28/17  4:28 PM  Result Value Ref Range   TSH 1.30 0.35 - 4.50 uIU/mL  LDL cholesterol, direct     Status: None   Collection Time: 07/28/17  4:28 PM  Result Value Ref Range   Direct LDL 85.0 mg/dL    Comment: Optimal:  <100 mg/dLNear or Above Optimal:  100-129 mg/dLBorderline High:  130-159 mg/dLHigh:  160-189 mg/dLVery High:  >190 mg/dL  Assessment/Plan: 1. Cellulitis of left leg Patient with + history and concern for reoccurrence. Irritation stemming from ill-fitting brace. Will get him back in with his Orthopedist so they can get him a new custom fitted brace. Chronic venous stasis noted without ulceration. The area of concern is slightly erythematous but without induration or warmth. Patient notes it is better than yesterday. Advised him to keep area clean and dry. Elevate extremity. Avoid wearing brace at home. Discussed extra padding until we can get him in with his specialist. Rx Keflex printed and given to start if there is any recurrence of warmth, tenderness or associated hardness.  - cephALEXin (KEFLEX) 500 MG capsule; Take 1 capsule (500 mg total) by mouth 2 (two) times daily for 7 days.  Dispense: 14 capsule; Refill: 0   Leeanne Rio, PA-C

## 2017-08-17 NOTE — Patient Instructions (Signed)
Please keep leg elevated while resting. Apply ice to help with swelling and tingling. The infection looks like it is resolving on its own with the pressure alleviated to the area. However if not continuing to improve or anything worsens, please start the antibiotic given.

## 2017-08-17 NOTE — Telephone Encounter (Signed)
Pt seen by PA, Hassell Done today and addressed below concern.

## 2017-08-17 NOTE — Progress Notes (Signed)
Pre visit review using our clinic review tool, if applicable. No additional management support is needed unless otherwise documented below in the visit note. 

## 2017-08-21 DIAGNOSIS — G4733 Obstructive sleep apnea (adult) (pediatric): Secondary | ICD-10-CM | POA: Diagnosis not present

## 2017-08-21 DIAGNOSIS — J9622 Acute and chronic respiratory failure with hypercapnia: Secondary | ICD-10-CM | POA: Diagnosis not present

## 2017-08-21 DIAGNOSIS — R0602 Shortness of breath: Secondary | ICD-10-CM | POA: Diagnosis not present

## 2017-08-21 DIAGNOSIS — J449 Chronic obstructive pulmonary disease, unspecified: Secondary | ICD-10-CM | POA: Diagnosis not present

## 2017-08-28 ENCOUNTER — Other Ambulatory Visit: Payer: Self-pay | Admitting: Family Medicine

## 2017-09-05 ENCOUNTER — Telehealth: Payer: Self-pay | Admitting: Family Medicine

## 2017-09-05 MED ORDER — GABAPENTIN 300 MG PO CAPS: 300.0000 mg | ORAL_CAPSULE | Freq: Three times a day (TID) | ORAL | 0 refills | Status: DC

## 2017-09-05 NOTE — Addendum Note (Signed)
Addended by: [name] A on: 09/05/2017 02:59 PM   Modules accepted: Orders

## 2017-09-05 NOTE — Telephone Encounter (Signed)
Please change his sig on his Gabapentin to 300 mg tid, disp 30 day supply with 5 rf or 90 day supply with 1 rf at patient discretion.

## 2017-09-05 NOTE — Telephone Encounter (Signed)
rx sig changed and sent to pharmacy

## 2017-09-05 NOTE — Telephone Encounter (Signed)
Please advise 

## 2017-09-05 NOTE — Telephone Encounter (Signed)
Pt states he was taking 300 mg gabapentin three times a day per Dr Charlett Blake; this was increased from 2 per day; the original prescription is showing twice daily;  pt is trying to get a refill saying that he needs new prescription for this and also tramadol refill; pt uses [facility] [place], [place]; please verify dosages and send for refill.

## 2017-09-11 ENCOUNTER — Other Ambulatory Visit: Payer: Self-pay | Admitting: Family Medicine

## 2017-09-15 DIAGNOSIS — R0602 Shortness of breath: Secondary | ICD-10-CM | POA: Diagnosis not present

## 2017-09-15 DIAGNOSIS — G4733 Obstructive sleep apnea (adult) (pediatric): Secondary | ICD-10-CM | POA: Diagnosis not present

## 2017-09-17 ENCOUNTER — Other Ambulatory Visit: Payer: Self-pay | Admitting: Cardiovascular Disease

## 2017-09-21 DIAGNOSIS — J9622 Acute and chronic respiratory failure with hypercapnia: Secondary | ICD-10-CM | POA: Diagnosis not present

## 2017-09-21 DIAGNOSIS — G4733 Obstructive sleep apnea (adult) (pediatric): Secondary | ICD-10-CM | POA: Diagnosis not present

## 2017-09-21 DIAGNOSIS — J449 Chronic obstructive pulmonary disease, unspecified: Secondary | ICD-10-CM | POA: Diagnosis not present

## 2017-09-21 DIAGNOSIS — R0602 Shortness of breath: Secondary | ICD-10-CM | POA: Diagnosis not present

## 2017-09-24 ENCOUNTER — Other Ambulatory Visit: Payer: Self-pay | Admitting: Family Medicine

## 2017-09-29 ENCOUNTER — Telehealth: Payer: Self-pay | Admitting: Internal Medicine

## 2017-09-29 NOTE — Telephone Encounter (Signed)
Spoke with pt's spouse, states that pt is doing well on NIV since being started on new machine in November.  Pt was contacted by [facility] and told to reach out to our office regarding his machine.  Pt's spouse states pt denies any complaints with NIV.    Called [facility] to ensure that nothing further was needed regarding bipap.  Spoke with Barbaraann Rondo, states nothing is needed on their end and no documentation of anyone contacting pt.?  Will close encounter.

## 2017-10-14 ENCOUNTER — Other Ambulatory Visit: Payer: Self-pay | Admitting: Family Medicine

## 2017-10-16 DIAGNOSIS — R0602 Shortness of breath: Secondary | ICD-10-CM | POA: Diagnosis not present

## 2017-10-16 DIAGNOSIS — G4733 Obstructive sleep apnea (adult) (pediatric): Secondary | ICD-10-CM | POA: Diagnosis not present

## 2017-10-22 DIAGNOSIS — J449 Chronic obstructive pulmonary disease, unspecified: Secondary | ICD-10-CM | POA: Diagnosis not present

## 2017-10-22 DIAGNOSIS — J9622 Acute and chronic respiratory failure with hypercapnia: Secondary | ICD-10-CM | POA: Diagnosis not present

## 2017-10-22 DIAGNOSIS — G4733 Obstructive sleep apnea (adult) (pediatric): Secondary | ICD-10-CM | POA: Diagnosis not present

## 2017-10-22 DIAGNOSIS — R0602 Shortness of breath: Secondary | ICD-10-CM | POA: Diagnosis not present

## 2017-10-27 ENCOUNTER — Other Ambulatory Visit: Payer: Self-pay | Admitting: Family Medicine

## 2017-10-28 DIAGNOSIS — G4733 Obstructive sleep apnea (adult) (pediatric): Secondary | ICD-10-CM | POA: Diagnosis not present

## 2017-11-10 ENCOUNTER — Telehealth: Payer: Self-pay | Admitting: Family Medicine

## 2017-11-10 NOTE — Telephone Encounter (Signed)
Copied from [place] [id number]. Topic: Quick Communication - Rx Refill/Question >> Nov 10, 2017  5:08 PM Boyd Kerbs wrote:  Medication: atorvastatin (LIPITOR)   Was told to take 1 1/2 pills and call in for new prescription. He is almost out   Has the patient contacted their pharmacy? No.   (Agent: If no, request that the patient contact the pharmacy for the refill.)   Preferred Pharmacy (with phone number or street name):   [id number] [place], [place] - [name] [place] [address] Phone: [phone] Fax: [phone]     Agent: Please be advised that RX refills may take up to 3 business days. We ask that you follow-up with your pharmacy.

## 2017-11-11 ENCOUNTER — Other Ambulatory Visit: Payer: Self-pay | Admitting: *Deleted

## 2017-11-11 MED ORDER — ATORVASTATIN CALCIUM 40 MG PO TABS: 40.0000 mg | ORAL_TABLET | Freq: Every day | ORAL | 0 refills | Status: DC

## 2017-11-11 NOTE — Telephone Encounter (Signed)
Rx refilled per protocol and patient need- he has been taking as instructed at last lab result. Has visit 11/24/17 and will need new instruction on his refills. If labs normal.

## 2017-11-16 DIAGNOSIS — G4733 Obstructive sleep apnea (adult) (pediatric): Secondary | ICD-10-CM | POA: Diagnosis not present

## 2017-11-16 DIAGNOSIS — R0602 Shortness of breath: Secondary | ICD-10-CM | POA: Diagnosis not present

## 2017-11-17 ENCOUNTER — Other Ambulatory Visit: Payer: Self-pay

## 2017-11-17 MED ORDER — ATORVASTATIN CALCIUM 40 MG PO TABS: ORAL_TABLET | ORAL | 0 refills | Status: DC

## 2017-11-19 DIAGNOSIS — J9622 Acute and chronic respiratory failure with hypercapnia: Secondary | ICD-10-CM | POA: Diagnosis not present

## 2017-11-19 DIAGNOSIS — G4733 Obstructive sleep apnea (adult) (pediatric): Secondary | ICD-10-CM | POA: Diagnosis not present

## 2017-11-19 DIAGNOSIS — J449 Chronic obstructive pulmonary disease, unspecified: Secondary | ICD-10-CM | POA: Diagnosis not present

## 2017-11-19 DIAGNOSIS — R0602 Shortness of breath: Secondary | ICD-10-CM | POA: Diagnosis not present

## 2017-11-23 ENCOUNTER — Other Ambulatory Visit: Payer: Self-pay | Admitting: Family Medicine

## 2017-11-24 ENCOUNTER — Ambulatory Visit (INDEPENDENT_AMBULATORY_CARE_PROVIDER_SITE_OTHER): Payer: PPO | Admitting: Family Medicine

## 2017-11-24 VITALS — BP 143/59 | HR 66 | Temp 97.7°F

## 2017-11-24 DIAGNOSIS — K5900 Constipation, unspecified: Secondary | ICD-10-CM | POA: Diagnosis not present

## 2017-11-24 DIAGNOSIS — M1A9XX0 Chronic gout, unspecified, without tophus (tophi): Secondary | ICD-10-CM | POA: Diagnosis not present

## 2017-11-24 DIAGNOSIS — E7800 Pure hypercholesterolemia, unspecified: Secondary | ICD-10-CM

## 2017-11-24 DIAGNOSIS — R739 Hyperglycemia, unspecified: Secondary | ICD-10-CM | POA: Diagnosis not present

## 2017-11-24 DIAGNOSIS — I350 Nonrheumatic aortic (valve) stenosis: Secondary | ICD-10-CM | POA: Diagnosis not present

## 2017-11-24 DIAGNOSIS — I872 Venous insufficiency (chronic) (peripheral): Secondary | ICD-10-CM | POA: Diagnosis not present

## 2017-11-24 DIAGNOSIS — Z23 Encounter for immunization: Secondary | ICD-10-CM

## 2017-11-24 MED ORDER — ATORVASTATIN CALCIUM 40 MG PO TABS: ORAL_TABLET | ORAL | 0 refills | Status: DC

## 2017-11-24 MED ORDER — GABAPENTIN 300 MG PO CAPS: 300.0000 mg | ORAL_CAPSULE | Freq: Three times a day (TID) | ORAL | 0 refills | Status: DC

## 2017-11-24 MED ORDER — ZOSTER VAC RECOMB ADJUVANTED 50 MCG/0.5ML IM SUSR: 0.5000 mL | Freq: Once | INTRAMUSCULAR | 1 refills | Status: AC

## 2017-11-24 MED ORDER — LISINOPRIL 10 MG PO TABS: 10.0000 mg | ORAL_TABLET | Freq: Every day | ORAL | 1 refills | Status: DC

## 2017-11-24 MED ORDER — ALLOPURINOL 300 MG PO TABS: 300.0000 mg | ORAL_TABLET | Freq: Every day | ORAL | 1 refills | Status: DC

## 2017-11-24 NOTE — Assessment & Plan Note (Signed)
hgba1c acceptable, minimize simple carbs. Increase exercise as tolerated.  

## 2017-11-24 NOTE — Assessment & Plan Note (Signed)
Tolerating statin, encouraged heart healthy diet, avoid trans fats, minimize simple carbs and saturated fats. Increase exercise as tolerated 

## 2017-11-24 NOTE — Progress Notes (Signed)
Subjective:    Patient ID: [name], male    DOB: [birth year], 77 y.o.   MRN: [id number] [name], CMA served as scribe for MD No chief complaint on file.   HPI  Patient is in today for a 4 month follow up. Patient has no acute concerns and feels well today. No recent febrile illness or hospitalizations. Denies CP/palp/HA/congestion/fevers/GI or GU c/o. Taking meds as prescribed. Has an appointment scheduled with Dr. Migdalia Dk for weight loss/management next week.   Patient Care Team: [name], MD as PCP - General (Family Medicine) Josue Hector, MD as Consulting Physician (Cardiology)   Past Medical History:  Diagnosis Date  . Anemia   . Aortic stenosis   . Aortic stenosis, severe    S/p Edwards Sapien 3 Transcatheter Heart Valve (size 26 mm, model # [id number], serial # [id number])  . Arthritis   . Bursitis   . Cataract    left immature  . Cellulitis 10/12/2015  . Complication of anesthesia    Halucinations  . Constipation    takes Miralax daily as well as Senokot daily  . DDD (degenerative disc disease)   . Gout    takes Allopurinol daily  . History of blood clots 1962   knee  . History of blood transfusion    no abnormal reaction noted  . History of shingles   . Hyperlipidemia    takes Atorvastatin daily  . Hypertension    takes Lisinopril daily  . Low back pain 01/25/2017  . Neck pain on left side 01/25/2017  . OSA (obstructive sleep apnea)   . Osteoarthritis   . Peripheral edema    takes Furosemide daily  . Peripheral neuropathy    takes Gabapentin daily  . Peroneal palsy    significant right foot drop  . Pneumonia 25+yrs ago   hx of  . Urinary frequency   . Urinary urgency   . Valvular heart disease     Past Surgical History:  Procedure Laterality Date  . CARDIAC CATHETERIZATION N/A 05/02/2015   Procedure: Right/Left Heart Cath and Coronary Angiography;  Surgeon: Burnell Blanks, MD;  Location: [place] [facility];  Service:  Cardiovascular;  Laterality: N/A;  . COLONOSCOPY N/A 02/07/2013   Procedure: COLONOSCOPY;  Surgeon: Juanita Craver, MD;  Location: [facility] ENDOSCOPY;  Service: Endoscopy;  Laterality: N/A;  . COLONOSCOPY N/A 02/09/2013   Procedure: COLONOSCOPY;  Surgeon: Beryle Beams, MD;  Location: [place];  Service: Endoscopy;  Laterality: N/A;  . ESOPHAGOGASTRODUODENOSCOPY N/A 02/07/2013   Procedure: ESOPHAGOGASTRODUODENOSCOPY (EGD);  Surgeon: Juanita Craver, MD;  Location: [facility] ENDOSCOPY;  Service: Endoscopy;  Laterality: N/A;  . GIVENS CAPSULE STUDY N/A 02/09/2013   Procedure: GIVENS CAPSULE STUDY;  Surgeon: Beryle Beams, MD;  Location: [place];  Service: Endoscopy;  Laterality: N/A;  . HERNIA REPAIR     umbilical hernia  . KNEE SURGERY Right    multiple knee surgeries due to complication of R TKA  . LAMINOTOMY  2563   c6-t2  . SPINE SURGERY    . TEE WITHOUT CARDIOVERSION N/A 03/07/2015   Procedure: TRANSESOPHAGEAL ECHOCARDIOGRAM (TEE);  Surgeon: Josue Hector, MD;  Location: [facility] ENDOSCOPY;  Service: Cardiovascular;  Laterality: N/A;  ANES TO BRING PROPOFOL PER DOCTOR  . TEE WITHOUT CARDIOVERSION N/A 06/10/2015   Procedure: TRANSESOPHAGEAL ECHOCARDIOGRAM (TEE);  Surgeon: Burnell Blanks, MD;  Location: [place];  Service: Open Heart Surgery;  Laterality: N/A;  . TOTAL KNEE ARTHROPLASTY  right  . TRANSCATHETER AORTIC VALVE REPLACEMENT, TRANSFEMORAL Left 06/10/2015   Procedure: TRANSCATHETER AORTIC VALVE REPLACEMENT, TRANSFEMORAL;  Surgeon: Burnell Blanks, MD;  Location: [place];  Service: Open Heart Surgery;  Laterality: Left;    Family History  Problem Relation Age of Onset  . Arthritis Sister        "crippling"  . Arthritis Sister     Social History   Socioeconomic History  . Marital status: Married    Spouse name: Not on file  . Number of children: Not on file  . Years of education: Not on file  . Highest education level: Not on file  Social Needs  . Financial resource strain:  Not on file  . Food insecurity - worry: Not on file  . Food insecurity - inability: Not on file  . Transportation needs - medical: Not on file  . Transportation needs - non-medical: Not on file  Occupational History  . Occupation: retired Nurse, mental health  . Smoking status: Former Smoker    Years: 30.00    Types: Pipe    Last attempt to quit: 09/20/2006    Years since quitting: 11.1  . Smokeless tobacco: Never Used  Substance and Sexual Activity  . Alcohol use: No    Alcohol/week: 0.0 oz  . Drug use: No  . Sexual activity: No    Comment: lives with wife, retired, no dietary restrictions  Other Topics Concern  . Not on file  Social History Narrative  . Not on file    Outpatient Medications Prior to Visit  Medication Sig Dispense Refill  . acetaminophen (TYLENOL) 500 MG tablet Take 500 mg by mouth 2 (two) times daily.     [name] aspirin 81 MG EC tablet TAKE 1 TABLET (81 MG TOTAL) BY MOUTH DAILY. 30 tablet 11  . cholecalciferol (VITAMIN D) 1000 UNITS tablet Take 2,000 Units by mouth daily.     . clopidogrel (PLAVIX) 75 MG tablet TAKE 1 TABLET (75 MG TOTAL) BY MOUTH DAILY WITH BREAKFAST. 90 tablet 3  . Coenzyme Q10 (COQ10) 200 MG CAPS Take 200 mg by mouth daily.    . furosemide (LASIX) 20 MG tablet TAKE 1 TABLET BY MOUTH TWICE A DAY 180 tablet 2  . HEMATINIC/FOLIC ACID 595-6 MG TABS TAKE 1 TABLET BY MOUTH EVERY DAY 30 each 3  . Multiple Vitamin (MULTIVITAMIN WITH MINERALS) TABS Take 1 tablet by mouth daily.    . polyethylene glycol (MIRALAX / GLYCOLAX) packet Take 17 g by mouth daily.    . sennosides-docusate sodium (SENOKOT-S) 8.6-50 MG tablet Take 1 tablet by mouth 2 (two) times daily.    [name] allopurinol (ZYLOPRIM) 300 MG tablet TAKE 1 TABLET (300 MG TOTAL) BY MOUTH DAILY. 30 tablet 0  . atorvastatin (LIPITOR) 40 MG tablet Take 1 and a half tablet daily by mouth  (60mg ) 135 tablet 0  . gabapentin (NEURONTIN) 300 MG capsule Take 1 capsule (300 mg total) by mouth 3 (three) times  daily. 270 capsule 0  . lisinopril (PRINIVIL,ZESTRIL) 10 MG tablet TAKE 1 TABLET BY MOUTH EVERY DAY 90 tablet 0  . traMADol (ULTRAM) 50 MG tablet TAKE 1 TABLET EVERY 6 HOURS AS NEEDED 90 tablet 0   No facility-administered medications prior to visit.     Allergies  Allergen Reactions  . Coumadin [Warfarin Sodium] Other (See Comments)    States he can't be on this-bleeds out    Review of Systems  Constitutional: Positive for malaise/fatigue. Negative for chills and  fever.  HENT: Negative for congestion and hearing loss.   Eyes: Negative for discharge.  Respiratory: Positive for shortness of breath. Negative for cough and sputum production.   Cardiovascular: Negative for chest pain, palpitations and leg swelling.  Gastrointestinal: Negative for abdominal pain, blood in stool, constipation, diarrhea, heartburn, nausea and vomiting.  Genitourinary: Negative for dysuria, frequency, hematuria and urgency.  Musculoskeletal: Negative for back pain, falls and myalgias.  Skin: Negative for rash.  Neurological: Negative for dizziness, sensory change, loss of consciousness, weakness and headaches.  Endo/Heme/Allergies: Negative for environmental allergies. Does not bruise/bleed easily.  Psychiatric/Behavioral: Negative for depression and suicidal ideas. The patient is not nervous/anxious and does not have insomnia.        Objective:    Physical Exam  Constitutional: He is oriented to person, place, and time. He appears well-developed and well-nourished. No distress.  HENT:  Head: Normocephalic and atraumatic.  Nose: Nose normal.  Eyes: Right eye exhibits no discharge. Left eye exhibits no discharge.  Neck: Normal range of motion. Neck supple.  Cardiovascular: Normal rate and regular rhythm.  No murmur heard. Pulmonary/Chest: Effort normal and breath sounds normal.  Abdominal: Soft. Bowel sounds are normal. There is no tenderness.  Musculoskeletal: He exhibits no edema.  Neurological:  He is alert and oriented to person, place, and time.  Skin: Skin is warm and dry.  Psychiatric: He has a normal mood and affect.  Nursing note and vitals reviewed.   BP (!) 143/59 (BP Location: Right Wrist, Patient Position: Sitting, Cuff Size: Normal) Comment: 23  Pulse 66   Temp 97.7 F (36.5 C) (Oral)  Wt Readings from Last 3 Encounters:  07/28/17 (!) 420 lb (190.5 kg)  02/04/17 (!) 400 lb (181.4 kg)  01/25/17 (!) 400 lb (181.4 kg)   BP Readings from Last 3 Encounters:  11/24/17 (!) 143/59  08/17/17 110/62  07/28/17 (!) 110/58     Immunization History  Administered Date(s) Administered  . Influenza Split 07/08/2011, 06/20/2012  . Influenza, High Dose Seasonal PF 06/22/2016, 07/06/2017  . Influenza,inj,Quad PF,6+ Mos 06/07/2013, 07/29/2014, 07/18/2015  . Influenza-Unspecified 06/26/2017  . PPD Test 02/16/2013  . Pneumococcal Conjugate-13 10/11/2013  . Pneumococcal Polysaccharide-23 11/07/2015  . Td 08/26/2006  . Zoster 07/27/2013    Health Maintenance  Topic Date Due  . Samul Dada  08/26/2016  . COLONOSCOPY  02/10/2023  . INFLUENZA VACCINE  Completed  . PNA vac Low Risk Adult  Completed    Lab Results  Component Value Date   WBC 6.7 11/24/2017   HGB 12.1 (L) 11/24/2017   HCT 37.6 (L) 11/24/2017   PLT 136.0 (L) 11/24/2017   GLUCOSE 106 (H) 11/24/2017   CHOL 119 11/24/2017   TRIG 148.0 11/24/2017   HDL 30.10 (L) 11/24/2017   LDLDIRECT 85.0 07/28/2017   LDLCALC 60 11/24/2017   ALT 18 11/24/2017   AST 21 11/24/2017   NA 140 11/24/2017   K 5.1 11/24/2017   CL 100 11/24/2017   CREATININE 1.36 11/24/2017   BUN 32 (H) 11/24/2017   CO2 34 (H) 11/24/2017   TSH 1.71 11/24/2017   INR 1.32 06/10/2015   HGBA1C 5.8 11/24/2017    Lab Results  Component Value Date   TSH 1.71 11/24/2017   Lab Results  Component Value Date   WBC 6.7 11/24/2017   HGB 12.1 (L) 11/24/2017   HCT 37.6 (L) 11/24/2017   MCV 99.4 11/24/2017   PLT 136.0 (L) 11/24/2017   Lab  Results  Component Value Date  NA 140 11/24/2017   K 5.1 11/24/2017   CO2 34 (H) 11/24/2017   GLUCOSE 106 (H) 11/24/2017   BUN 32 (H) 11/24/2017   CREATININE 1.36 11/24/2017   BILITOT 0.3 11/24/2017   ALKPHOS 76 11/24/2017   AST 21 11/24/2017   ALT 18 11/24/2017   PROT 6.5 11/24/2017   ALBUMIN 3.7 11/24/2017   CALCIUM 9.8 11/24/2017   ANIONGAP 6 02/04/2017   GFR 54.25 (L) 11/24/2017   Lab Results  Component Value Date   CHOL 119 11/24/2017   Lab Results  Component Value Date   HDL 30.10 (L) 11/24/2017   Lab Results  Component Value Date   LDLCALC 60 11/24/2017   Lab Results  Component Value Date   TRIG 148.0 11/24/2017   Lab Results  Component Value Date   CHOLHDL 4 11/24/2017   Lab Results  Component Value Date   HGBA1C 5.8 11/24/2017         Assessment & Plan:   Problem List Items Addressed This Visit    HYPERCHOLESTEROLEMIA    Tolerating statin, encouraged heart healthy diet, avoid trans fats, minimize simple carbs and saturated fats. Increase exercise as tolerated      Relevant Medications   lisinopril (PRINIVIL,ZESTRIL) 10 MG tablet   atorvastatin (LIPITOR) 40 MG tablet   Other Relevant Orders   Lipid panel (Completed)   Gout    Check uric acid level      Relevant Orders   Uric acid (Completed)   OBESITY, MORBID    Encouraged DASH diet, decrease po intake and increase exercise as tolerated. Needs 7-8 hours of sleep nightly. Avoid trans fats, eat small, frequent meals every 4-5 hours with lean proteins, complex carbs and healthy fats. Minimize simple carbs, bariatric referral      Chronic venous insufficiency    Continue to monitor. Maintain adequate hydration.       Relevant Medications   lisinopril (PRINIVIL,ZESTRIL) 10 MG tablet   atorvastatin (LIPITOR) 40 MG tablet   Severe aortic valve stenosis    Is doing well despite being largely confined to his wheelchair.       Relevant Medications   lisinopril (PRINIVIL,ZESTRIL) 10 MG  tablet   atorvastatin (LIPITOR) 40 MG tablet   Constipation - Primary   Relevant Orders   CBC (Completed)   TSH (Completed)   Hyperglycemia    hgba1c acceptable, minimize simple carbs. Increase exercise as tolerated.       Relevant Orders   Hemoglobin A1c (Completed)   Comprehensive metabolic panel (Completed)   TSH (Completed)    Other Visit Diagnoses    Need for viral immunization          I have discontinued Tyjon D. Oshields's traMADol. I have also changed his lisinopril. Additionally, I am having him start on Zoster Vaccine Adjuvanted. Lastly, I am having him maintain his cholecalciferol, sennosides-docusate sodium, multivitamin with minerals, acetaminophen, CoQ10, polyethylene glycol, aspirin, clopidogrel, furosemide, HEMATINIC/FOLIC ACID, gabapentin, allopurinol, and atorvastatin.  Meds ordered this encounter  Medications  . lisinopril (PRINIVIL,ZESTRIL) 10 MG tablet    Sig: Take 1 tablet (10 mg total) by mouth daily.    Dispense:  90 tablet    Refill:  1  . gabapentin (NEURONTIN) 300 MG capsule    Sig: Take 1 capsule (300 mg total) by mouth 3 (three) times daily.    Dispense:  270 capsule    Refill:  0  . allopurinol (ZYLOPRIM) 300 MG tablet    Sig: Take 1 tablet (300 mg  total) by mouth daily.    Dispense:  90 tablet    Refill:  1  . atorvastatin (LIPITOR) 40 MG tablet    Sig: Take 1 and a half tablet daily by mouth  (60mg )    Dispense:  135 tablet    Refill:  0  . Zoster Vaccine Adjuvanted [facility]) injection    Sig: Inject 0.5 mLs into the muscle once for 1 dose.    Dispense:  0.5 mL    Refill:  1    CMA served as scribe during this visit. History, Physical and Plan performed by medical provider. Documentation and orders reviewed and attested to.  Penni Homans, MD

## 2017-11-24 NOTE — Patient Instructions (Addendum)
Encouraged increased rest and hydration, add probiotics, zinc such as Coldeze or Xicam. Treat fevers as needed. Vitamin C 500 mg, Elder Cholesterol Cholesterol is a fat. Your body needs a small amount of cholesterol. Cholesterol (plaque) may build up in your blood vessels (arteries). That makes you more likely to have a heart attack or stroke. You cannot feel your cholesterol level. Having a blood test is the only way to find out if your level is high. Keep your test results. Work with your doctor to keep your cholesterol at a good level. What do the results mean?  Total cholesterol is how much cholesterol is in your blood.  LDL is bad cholesterol. This is the type that can build up. Try to have low LDL.  HDL is good cholesterol. It cleans your blood vessels and carries LDL away. Try to have high HDL.  Triglycerides are fat that the body can store or burn for energy. What are good levels of cholesterol?  Total cholesterol below 200.  LDL below 100 is good for people who have health risks. LDL below 70 is good for people who have very high risks.  HDL above 40 is good. It is best to have HDL of 60 or higher.  Triglycerides below 150. How can I lower my cholesterol? Diet Follow your diet program as told by your doctor.  Choose fish, white meat chicken, or [place] that is roasted or baked. Try not to eat red meat, fried foods, sausage, or lunch meats.  Eat lots of fresh fruits and vegetables.  Choose whole grains, beans, pasta, potatoes, and cereals.  Choose olive oil, corn oil, or canola oil. Only use small amounts.  Try not to eat butter, mayonnaise, shortening, or palm kernel oils.  Try not to eat foods with trans fats.  Choose low-fat or nonfat dairy foods. ? Drink skim or nonfat milk. ? Eat low-fat or nonfat yogurt and cheeses. ? Try not to drink whole milk or cream. ? Try not to eat ice cream, egg yolks, or full-fat cheeses.  Healthy desserts include angel food cake,  ginger snaps, animal crackers, hard candy, popsicles, and low-fat or nonfat frozen yogurt. Try not to eat pastries, cakes, pies, and cookies.  Exercise Follow your exercise program as told by your doctor.  Be more active. Try gardening, walking, and taking the stairs.  Ask your doctor about ways that you can be more active.  Medicine  Take over-the-counter and prescription medicines only as told by your doctor. This information is not intended to replace advice given to you by your health care provider. Make sure you discuss any questions you have with your health care provider. Document Released: 12/03/2008 Document Revised: 04/07/2016 Document Reviewed: 03/18/2016 Elsevier Interactive Patient Education  2018 [place] berry, [place] and Mucinex twice daily

## 2017-11-24 NOTE — Assessment & Plan Note (Signed)
Check uric acid level 

## 2017-11-25 LAB — CBC
HCT: 37.6 % — ABNORMAL LOW (ref 39.0–52.0)
Hemoglobin: 12.1 g/dL — ABNORMAL LOW (ref 13.0–17.0)
MCHC: 32 g/dL (ref 30.0–36.0)
MCV: 99.4 fl (ref 78.0–100.0)
Platelets: 136 10*3/uL — ABNORMAL LOW (ref 150.0–400.0)
RBC: 3.78 Mil/uL — ABNORMAL LOW (ref 4.22–5.81)
RDW: 18 % — ABNORMAL HIGH (ref 11.5–15.5)
WBC: 6.7 10*3/uL (ref 4.0–10.5)

## 2017-11-25 LAB — COMPREHENSIVE METABOLIC PANEL WITH GFR
ALT: 18 U/L (ref 0–53)
AST: 21 U/L (ref 0–37)
Albumin: 3.7 g/dL (ref 3.5–5.2)
Alkaline Phosphatase: 76 U/L (ref 39–117)
BUN: 32 mg/dL — ABNORMAL HIGH (ref 6–23)
CO2: 34 meq/L — ABNORMAL HIGH (ref 19–32)
Calcium: 9.8 mg/dL (ref 8.4–10.5)
Chloride: 100 meq/L (ref 96–112)
Creatinine, Ser: 1.36 mg/dL (ref 0.40–1.50)
GFR: 54.25 mL/min — ABNORMAL LOW (ref >60.00)
Glucose, Bld: 106 mg/dL — ABNORMAL HIGH (ref 70–99)
Potassium: 5.1 meq/L (ref 3.5–5.1)
Sodium: 140 meq/L (ref 135–145)
Total Bilirubin: 0.3 mg/dL (ref 0.2–1.2)
Total Protein: 6.5 g/dL (ref 6.0–8.3)

## 2017-11-25 LAB — LIPID PANEL
Cholesterol: 119 mg/dL (ref 0–200)
HDL: 30.1 mg/dL — ABNORMAL LOW (ref >39.00)
LDL Cholesterol: 60 mg/dL (ref 0–99)
NonHDL: 89.37
Total CHOL/HDL Ratio: 4
Triglycerides: 148 mg/dL (ref 0.0–149.0)
VLDL: 29.6 mg/dL (ref 0.0–40.0)

## 2017-11-25 LAB — URIC ACID: Uric Acid, Serum: 6.1 mg/dL (ref 4.0–7.8)

## 2017-11-25 LAB — TSH: TSH: 1.71 u[IU]/mL (ref 0.35–4.50)

## 2017-11-25 LAB — HEMOGLOBIN A1C: Hgb A1c MFr Bld: 5.8 % (ref 4.6–6.5)

## 2017-11-27 NOTE — Assessment & Plan Note (Signed)
Continue to monitor. Maintain adequate hydration.

## 2017-11-27 NOTE — Assessment & Plan Note (Signed)
Is doing well despite being largely confined to his wheelchair.

## 2017-11-27 NOTE — Assessment & Plan Note (Signed)
Encouraged DASH diet, decrease po intake and increase exercise as tolerated. Needs 7-8 hours of sleep nightly. Avoid trans fats, eat small, frequent meals every 4-5 hours with lean proteins, complex carbs and healthy fats. Minimize simple carbs, bariatric referral

## 2017-12-01 ENCOUNTER — Encounter (INDEPENDENT_AMBULATORY_CARE_PROVIDER_SITE_OTHER): Payer: Self-pay

## 2017-12-06 ENCOUNTER — Encounter (INDEPENDENT_AMBULATORY_CARE_PROVIDER_SITE_OTHER): Payer: Self-pay | Admitting: Family Medicine

## 2017-12-06 ENCOUNTER — Ambulatory Visit (INDEPENDENT_AMBULATORY_CARE_PROVIDER_SITE_OTHER): Payer: PPO | Admitting: Family Medicine

## 2017-12-06 VITALS — BP 122/82 | HR 60 | Temp 98.1°F | Ht 74.0 in | Wt 385.0 lb

## 2017-12-06 DIAGNOSIS — G4733 Obstructive sleep apnea (adult) (pediatric): Secondary | ICD-10-CM | POA: Diagnosis not present

## 2017-12-06 DIAGNOSIS — Z1331 Encounter for screening for depression: Secondary | ICD-10-CM | POA: Diagnosis not present

## 2017-12-06 DIAGNOSIS — R0602 Shortness of breath: Secondary | ICD-10-CM | POA: Diagnosis not present

## 2017-12-06 DIAGNOSIS — Z6841 Body Mass Index (BMI) 40.0 and over, adult: Secondary | ICD-10-CM | POA: Diagnosis not present

## 2017-12-06 DIAGNOSIS — I350 Nonrheumatic aortic (valve) stenosis: Secondary | ICD-10-CM | POA: Insufficient documentation

## 2017-12-06 DIAGNOSIS — R5383 Other fatigue: Secondary | ICD-10-CM | POA: Diagnosis not present

## 2017-12-06 DIAGNOSIS — I10 Essential (primary) hypertension: Secondary | ICD-10-CM | POA: Insufficient documentation

## 2017-12-06 DIAGNOSIS — Z0289 Encounter for other administrative examinations: Secondary | ICD-10-CM

## 2017-12-06 NOTE — Progress Notes (Signed)
.  Office: [phone]  /  Fax: [phone]   HPI:   Chief Complaint: [name] (MR# [id number]) is a 77 y.o. male who presents on 12/06/2017 for obesity evaluation and treatment. Current BMI is Body mass index is 49.43 kg/m.[name] has struggled with obesity for years and has been unsuccessful in either losing weight or maintaining long term weight loss. [name] attended our information session and states he is currently in the action stage of change and ready to dedicate time achieving and maintaining a healthier weight.  [name] states his family eats meals together he thinks his family will eat healthier with  him he has been heavy most of  his life he started gaining weight in 2012 his heaviest weight ever was 400 lbs. he has significant food cravings issues  he snacks frequently in the evenings he is frequently drinking liquids with calories he frequently makes poor food choices he has binge eating behaviors he struggles with emotional eating    Fatigue [name] feels his energy is lower than it should be. This has worsened with weight gain and has not worsened recently. [name] admits to daytime somnolence and denies waking up still tired. Patient has a diagnosis of obstructive sleep apnea which contributes to fatigue. Patent has a history of symptoms of daytime fatigue. Patient generally gets 7 or 8 hours of sleep per night, and states they generally have restful sleep. Snoring is present. Apneic episodes are present. Epworth Sleepiness Score is 3  Dyspnea on exertion [name] notes increasing shortness of breath with exercising and seems to be worsening over time with weight gain. He notes getting out of breath sooner with activity than he used to. This has not gotten worse recently. [name] denies orthopnea.  Hypertension [name] is a 77 y.o. male with hypertension. He is currently on medications. [name] denies chest pain or shortness of breath on exertion. He is  attempting to work on weight loss to help control his blood pressure with the goal of decreasing his risk of heart attack and stroke. [name] would like to control his blood pressure with diet. [name] blood pressure is stable.  Severe Aortic Stenosis [name] is status post aortic valve replacement in 2016. He is followed by cardiology. [name] gets short of breath with any physical activity. He uses a scooter for mobility.  Vitamin D deficiency [name] has a diagnosis of vitamin D deficiency. He is not currently taking vit D and admits fatigue. [name] denies nausea, vomiting, muscle cramping or muscle weakness.  Sleep Apnea [name] is stable on BiPap with oxygen. He would like to work on weight loss to help improve his breathing. [name] also has a history of chronic obstructive pulmonary disease.  Depression Screen [name] Food and Mood (modified PHQ-9) score was  Depression screen PHQ 2/9 12/06/2017  Decreased Interest 1  Down, Depressed, Hopeless 0  PHQ - 2 Score 1  Altered sleeping 3  Tired, decreased energy 2  Change in appetite 0  Feeling bad or failure about yourself  0  Trouble concentrating 0  Moving slowly or fidgety/restless 2  Suicidal thoughts 0  PHQ-9 Score 8  Difficult doing work/chores Not difficult at all    ALLERGIES: Allergies  Allergen Reactions  . Coumadin [Warfarin Sodium] Other (See Comments)    States he can't be on this-bleeds out    MEDICATIONS: Current Outpatient Medications on File Prior to Visit  Medication Sig Dispense Refill  . acetaminophen (TYLENOL) 500 MG tablet  Take 500 mg by mouth 2 (two) times daily.     [name] allopurinol (ZYLOPRIM) 300 MG tablet Take 1 tablet (300 mg total) by mouth daily. 90 tablet 1  . aspirin 81 MG EC tablet TAKE 1 TABLET (81 MG TOTAL) BY MOUTH DAILY. 30 tablet 11  . atorvastatin (LIPITOR) 40 MG tablet Take 1 and a half tablet daily by mouth  (60mg ) 135 tablet 0  . cholecalciferol (VITAMIN D) 1000 UNITS tablet Take 2,000 Units by mouth  daily.     . clopidogrel (PLAVIX) 75 MG tablet TAKE 1 TABLET (75 MG TOTAL) BY MOUTH DAILY WITH BREAKFAST. 90 tablet 3  . Coenzyme Q10 (COQ10) 200 MG CAPS Take 200 mg by mouth daily.    . furosemide (LASIX) 20 MG tablet TAKE 1 TABLET BY MOUTH TWICE A DAY 180 tablet 2  . gabapentin (NEURONTIN) 300 MG capsule Take 1 capsule (300 mg total) by mouth 3 (three) times daily. 270 capsule 0  . HEMATINIC/FOLIC ACID 017-5 MG TABS TAKE 1 TABLET BY MOUTH EVERY DAY 30 each 3  . lisinopril (PRINIVIL,ZESTRIL) 10 MG tablet Take 1 tablet (10 mg total) by mouth daily. 90 tablet 1  . Misc Natural Products (TART CHERRY ADVANCED PO) Take 1 capsule by mouth daily.    . Multiple Vitamin (MULTIVITAMIN WITH MINERALS) TABS Take 1 tablet by mouth daily.    . polyethylene glycol (MIRALAX / GLYCOLAX) packet Take 17 g by mouth daily.    . sennosides-docusate sodium (SENOKOT-S) 8.6-50 MG tablet Take 1 tablet by mouth 2 (two) times daily.     No current facility-administered medications on file prior to visit.     PAST MEDICAL HISTORY: Past Medical History:  Diagnosis Date  . Anemia   . Aortic stenosis   . Aortic stenosis, severe    S/p Edwards Sapien 3 Transcatheter Heart Valve (size 26 mm, model # [id number], serial # [id number])  . Arthritis   . Back pain   . Bursitis   . Cataract    left immature  . Cellulitis 10/12/2015  . Complication of anesthesia    Halucinations  . Constipation    takes Miralax daily as well as Senokot daily  . DDD (degenerative disc disease)   . Gout    takes Allopurinol daily  . Heart valve disorder   . History of blood clots 1962   knee  . History of blood transfusion    no abnormal reaction noted  . History of shingles   . Hyperlipidemia    takes Atorvastatin daily  . Hypertension    takes Lisinopril daily  . Joint pain   . Low back pain 01/25/2017  . Neck pain on left side 01/25/2017  . OSA (obstructive sleep apnea)   . Osteoarthritis   . Peripheral edema    takes Furosemide  daily  . Peripheral neuropathy    takes Gabapentin daily  . Peroneal palsy    significant right foot drop  . Pneumonia 25+yrs ago   hx of  . Shortness of breath   . Swelling of extremity   . Urinary frequency   . Urinary urgency   . Valvular heart disease     PAST SURGICAL HISTORY: Past Surgical History:  Procedure Laterality Date  . CARDIAC CATHETERIZATION N/A 05/02/2015   Procedure: Right/Left Heart Cath and Coronary Angiography;  Surgeon: Burnell Blanks, MD;  Location: [facility];  Service: Cardiovascular;  Laterality: N/A;  . COLONOSCOPY N/A 02/07/2013   Procedure: COLONOSCOPY;  Surgeon:  Juanita Craver, MD;  Location: [facility];  Service: Endoscopy;  Laterality: N/A;  . COLONOSCOPY N/A 02/09/2013   Procedure: COLONOSCOPY;  Surgeon: Beryle Beams, MD;  Location: [facility];  Service: Endoscopy;  Laterality: N/A;  . ESOPHAGOGASTRODUODENOSCOPY N/A 02/07/2013   Procedure: ESOPHAGOGASTRODUODENOSCOPY (EGD);  Surgeon: Juanita Craver, MD;  Location[facility] END[facility]COPY;  Service: Endoscopy;  Laterality: N/A;  . GIVENS CAPSULE STUDY N/A 02/09/2013   Procedure: GIVENS CAPSULE STUDY;  Surgeon: Beryle Beams, MD;  Location: [facility];  Service: Endoscopy;  Laterality: N/A;  . HERNIA REPAIR     umbilical hernia  . KNEE SURGERY Right    multiple knee surgeries due to complication of R TKA  . LAMINOTOMY  6237   c6-t2  . SPINE SURGERY    . TEE WITHOUT CARDIOVERSION N/A 03/07/2015   Procedure: TRANSESOPHAGEAL ECHOCARDIOGRAM (TEE);  Surgeon: Josue Hector, MD;  Location: [facility] ENDOSCOPY;  Service: Cardiovascular;  Laterality: N/A;  ANES TO BRING PROPOFOL PER DOCTOR  . TEE WITHOUT CARDIOVERSION N/A 06/10/2015   Procedure: TRANSESOPHAGEAL ECHOCARDIOGRAM (TEE);  Surgeon: Burnell Blanks, MD;  Location: [place];  Service: Open Heart Surgery;  Laterality: N/A;  . TOTAL KNEE ARTHROPLASTY     right  . TRANSCATHETER AORTIC VALVE REPLACEMENT, TRANSFEMORAL Left 06/10/2015   Procedure:  TRANSCATHETER AORTIC VALVE REPLACEMENT, TRANSFEMORAL;  Surgeon: Burnell Blanks, MD;  Location: [place];  Service: Open Heart Surgery;  Laterality: Left;    SOCIAL HISTORY: Social History   Tobacco Use  . Smoking status: Former Smoker    Years: 30.00    Types: Pipe    Last attempt to quit: 09/20/2006    Years since quitting: 11.2  . Smokeless tobacco: Never Used  Substance Use Topics  . Alcohol use: No    Alcohol/week: 0.0 oz  . Drug use: No    FAMILY HISTORY: Family History  Problem Relation Age of Onset  . Arthritis Sister        "crippling"  . Arthritis Sister     ROS: Review of Systems  Constitutional: Positive for malaise/fatigue.  Eyes:       Wear Glasses or Contacts Floaters  Respiratory: Positive for shortness of breath (with activity).   Cardiovascular: Negative for orthopnea.       Claf/Leg Pain with Walking  Gastrointestinal: Negative for nausea and vomiting.  Musculoskeletal: Positive for back pain.       Muscle or Joint Pain Negative for muscle weakness Negative for muscle cramping  Endo/Heme/Allergies: Bruises/bleeds easily.       Heat/Cold Intolerance    PHYSICAL EXAM: Blood pressure 122/82, pulse 60, temperature 98.1 F (36.7 C), temperature source Oral, height 6\' 2"  (1.88 m), weight (!) 385 lb (174.6 kg), SpO2 91 %. Body mass index is 49.43 kg/m. Physical Exam  Constitutional: He is oriented to person, place, and time. He appears well-developed and well-nourished.  HENT:  Head: Normocephalic and atraumatic.  Nose: Nose normal.  Eyes: EOM are normal. No scleral icterus.  Neck: Normal range of motion. Neck supple. No thyromegaly present.  Cardiovascular: Normal rate.  Murmur heard.  Systolic murmur is present with a grade of 3/6. Pulmonary/Chest: Effort normal. No respiratory distress. He has no wheezes (or ronchi).  Abdominal: Soft. There is no tenderness.  +obesity  Musculoskeletal: Normal range of motion. He exhibits edema (2+ edema  bilateral lower extremities).  Range of Motion normal in all 4 extremities  Neurological: He is alert and oriented to person, place, and time. Coordination normal.  Skin:  Skin is warm and dry.  Psychiatric: He has a normal mood and affect. His behavior is normal.  Vitals reviewed.   RECENT LABS AND TESTS: BMET    Component Value Date/Time   NA 140 11/24/2017 1633   K 5.1 11/24/2017 1633   CL 100 11/24/2017 1633   CO2 34 (H) 11/24/2017 1633   GLUCOSE 106 (H) 11/24/2017 1633   BUN 32 (H) 11/24/2017 1633   CREATININE 1.36 11/24/2017 1633   CREATININE 1.19 02/28/2015 1548   CALCIUM 9.8 11/24/2017 1633   GFRNONAA 40 (L) 02/04/2017 1229   GFRAA 46 (L) 02/04/2017 1229   Lab Results  Component Value Date   HGBA1C 5.8 11/24/2017   No results found for: INSULIN CBC    Component Value Date/Time   WBC 6.7 11/24/2017 1633   RBC 3.78 (L) 11/24/2017 1633   HGB 12.1 (L) 11/24/2017 1633   HCT 37.6 (L) 11/24/2017 1633   PLT 136.0 (L) 11/24/2017 1633   MCV 99.4 11/24/2017 1633   MCH 30.5 02/04/2017 1229   MCHC 32.0 11/24/2017 1633   RDW 18.0 (H) 11/24/2017 1633   LYMPHSABS 1.3 07/28/2017 1628   MONOABS 0.5 07/28/2017 1628   EOSABS 0.1 07/28/2017 1628   BASOSABS 0.1 07/28/2017 1628   Iron/TIBC/Ferritin/ %Sat No results found for: IRON, TIBC, FERRITIN, IRONPCTSAT Lipid Panel     Component Value Date/Time   CHOL 119 11/24/2017 1633   TRIG 148.0 11/24/2017 1633   HDL 30.10 (L) 11/24/2017 1633   CHOLHDL 4 11/24/2017 1633   VLDL 29.6 11/24/2017 1633   LDLCALC 60 11/24/2017 1633   LDLDIRECT 85.0 07/28/2017 1628   Hepatic Function Panel     Component Value Date/Time   PROT 6.5 11/24/2017 1633   ALBUMIN 3.7 11/24/2017 1633   AST 21 11/24/2017 1633   ALT 18 11/24/2017 1633   ALKPHOS 76 11/24/2017 1633   BILITOT 0.3 11/24/2017 1633   BILIDIR 0.1 02/21/2015 0921   IBILI 0.4 04/19/2014 1418      Component Value Date/Time   TSH 1.71 11/24/2017 1633   Vitamin D Results for  RENAN, DANESE (MRN [id number]) as of 12/06/2017 16:31  Ref. Range 06/07/2013 11:59  Vitamin D, 25-Hydroxy Latest Ref Range: 30 - 89 ng/mL 49    ECG  shows NSR with a rate of 64 BPM INDIRECT CALORIMETER done today shows a VO2 of 236 and a REE of 1644.    ASSESSMENT AND PLAN: Other fatigue - Plan: EKG 12-Lead, Hemoglobin A1c, Insulin, random, VITAMIN D 25 Hydroxy (Vit-D Deficiency, Fractures)  Shortness of breath on exertion  Essential hypertension  Obstructive sleep apnea syndrome  Severe aortic stenosis  Depression screening  Class 3 severe obesity with serious comorbidity and body mass index (BMI) of 45.0 to 49.9 in adult, unspecified obesity type ([place])  PLAN:  Fatigue [name] was informed that his fatigue may be related to obesity, depression or many other causes. Labs will be ordered, and in the meanwhile Dacari has agreed to work on diet, exercise and weight loss to help with fatigue. Proper sleep hygiene was discussed including the need for 7-8 hours of quality sleep each night. A sleep study was not ordered based on symptoms and Epworth score.  Dyspnea on exertion [name]'s shortness of breath appears to be obesity related and exercise induced. He has agreed to work on weight loss and gradually increase exercise to treat his exercise induced shortness of breath. If [name] follows our instructions and loses weight without improvement of his shortness  of breath, we will plan to refer to pulmonology. We will monitor this condition regularly. Mayer agrees to this plan.  Hypertension We discussed sodium restriction, working on healthy weight loss, and a regular exercise program as the means to achieve improved blood pressure control. [name] agreed with this plan and agreed to follow up as directed. We will recheck blood pressure in 2 weeks and will continue to monitor his blood pressure as well as his progress with the above lifestyle modifications. He will continue his medications as  prescribed and will watch for signs of hypotension as he continues his lifestyle modifications.  Severe Aortic Stenosis [name] is likely deconditioned due to immobility, versus worsening aortic stenosis. Bawi is to work on weight loss and continue to follow up with cardiology as advised. We will continue to monitor his progress.  Vitamin D Deficiency Oday was informed that low vitamin D levels contributes to fatigue and are associated with obesity, breast, and colon cancer. He will follow up for routine testing of vitamin D, at least 2-3 times per year. We will check labs and follow.  Sleep Apnea [name] will continue with BiPap and he will work on diet and weight loss in the meanwhile to help improve sleep apnea. [name] agrees to follow up with our clinic in 2 weeks.  Depression [name] had a mildly positive depression screening. Depression is commonly associated with obesity and often results in emotional eating behaviors. We will monitor this closely and work on CBT to help improve the non-hunger eating patterns. Referral to Psychology may be required if no improvement is seen as he continues in our clinic.  Obesity [name] is currently in the action stage of change and his goal is to continue with weight loss efforts He has agreed to follow the Category 3 [name] has been instructed to work up to a goal of 150 minutes of combined cardio and strengthening exercise per week for weight loss and overall health benefits. We discussed the following Behavioral Modification Strategies today: increasing lean protein intake, decreasing simple carbohydrates , decrease eating out and work on meal planning and easy cooking plans  [name] has agreed to follow up with our clinic in 2 weeks. He was informed of the importance of frequent follow up visits to maximize his success with intensive lifestyle modifications for his multiple health conditions. He was informed we would discuss his lab results at his  next visit unless there is a critical issue that needs to be addressed sooner. Billye agreed to keep his next visit at the agreed upon time to discuss these results.    OBESITY BEHAVIORAL INTERVENTION VISIT  Today's visit was # 1 out of 22.  Starting weight: 385 lbs Starting date: 12/06/17 Today's weight : 385 lbs Today's date: 12/06/2017 Total lbs lost to date: 0 (Patients must lose 7 lbs in the first 6 months to continue with counseling)   ASK: We discussed the diagnosis of obesity with [name] today and Lukis agreed to give [name] permission to discuss obesity behavioral modification therapy today.  ASSESS: Kendricks has the diagnosis of obesity and his BMI today is 49.41 Severn is in the action stage of change   ADVISE: Yan was educated on the multiple health risks of obesity as well as the benefit of weight loss to improve his health. He was advised of the need for long term treatment and the importance of lifestyle modifications.  AGREE: Multiple dietary modification options and treatment options were discussed and  Torell agreed to the above obesity treatment plan.   I, Doreene Nest, am acting as transcriptionist for Dennard Nip, MD   I have reviewed the above documentation for accuracy and completeness, and I agree with the above. -Dennard Nip, MD

## 2017-12-07 LAB — INSULIN, RANDOM: INSULIN: 21.5 u[IU]/mL (ref 2.6–24.9)

## 2017-12-07 LAB — VITAMIN D 25 HYDROXY (VIT D DEFICIENCY, FRACTURES): Vit D, 25-Hydroxy: 47.9 ng/mL (ref 30.0–100.0)

## 2017-12-07 LAB — HEMOGLOBIN A1C
Est. average glucose Bld gHb Est-mCnc: 111 mg/dL
Hgb A1c MFr Bld: 5.5 % (ref 4.8–5.6)

## 2017-12-13 ENCOUNTER — Telehealth: Payer: Self-pay | Admitting: Internal Medicine

## 2017-12-13 NOTE — Telephone Encounter (Signed)
Spoke with pt, he states he received a letter from [employer] and they were going to stop paying for his Bipap machine.  I called [employer] and Corene Cornea stated he didn't have any notes explaining what was needed from his insurance.  I called [employer] and they did not have any record of a letter that was sent to him.   I called pt to advise, he will bring the letter up here so we can further investigate why he received the letter. Will leave encounter open until received.

## 2017-12-14 DIAGNOSIS — G4733 Obstructive sleep apnea (adult) (pediatric): Secondary | ICD-10-CM | POA: Diagnosis not present

## 2017-12-14 DIAGNOSIS — R0602 Shortness of breath: Secondary | ICD-10-CM | POA: Diagnosis not present

## 2017-12-15 NOTE — Telephone Encounter (Signed)
Attempted to call pt to follow up to see if he had brought the letter to our office so we can further investigate why his insurance is going to stop paying for his bipap machine but no answer.  Left message for pt to return our call x1

## 2017-12-16 NOTE — Telephone Encounter (Signed)
Pt is calling Raquel Sarna back [phone]

## 2017-12-16 NOTE — Telephone Encounter (Signed)
Called pt and spoke with his spouse [name] who stated to me the letters they had received showed that they had owned money for pt's BIPAP but stated there was a Emergency planning/management officer on the machine of [employer] but issue had been solved.  I stated to [name] to call our office if they needed anything.  Cheryl expressed understanding. Nothing further needed at this time.

## 2017-12-19 ENCOUNTER — Ambulatory Visit (INDEPENDENT_AMBULATORY_CARE_PROVIDER_SITE_OTHER): Payer: PPO | Admitting: Family Medicine

## 2017-12-19 VITALS — BP 127/84 | HR 63 | Temp 97.7°F | Ht 74.0 in | Wt 387.0 lb

## 2017-12-19 DIAGNOSIS — Z6841 Body Mass Index (BMI) 40.0 and over, adult: Secondary | ICD-10-CM

## 2017-12-19 DIAGNOSIS — R7303 Prediabetes: Secondary | ICD-10-CM

## 2017-12-19 DIAGNOSIS — I10 Essential (primary) hypertension: Secondary | ICD-10-CM

## 2017-12-19 NOTE — Progress Notes (Signed)
Subjective:   [name] is a 77 y.o. male who presents for Medicare Annual/Subsequent preventive examination.  Review of Systems: No ROS.  Medicare Wellness Visit. Additional risk factors are reflected in the social history.  Cardiac Risk Factors include: advanced age (>22men, >52 women);hypertension;male gender;obesity (BMI >30kg/m2);sedentary lifestyle Sleep patterns:  Wears Bi-PAP and O2 at night.  Home Safety/Smoke Alarms: Feels safe in home. Smoke alarms in place.  Living environment; residence and Firearm Safety: Lives with wife. Uses walker in the home.   Male:   CCS-  Pt declines   PSA- No results found for: PSA      Objective:    Vitals: BP 135/66 (BP Location: Right Wrist, Patient Position: Sitting, Cuff Size: Normal)   Pulse 63   Wt (!) 387 lb (175.5 kg) Comment: per pt from yesterday appt  SpO2 92%   BMI 49.69 kg/m   Body mass index is 49.69 kg/m.  Advanced Directives 12/20/2017 02/04/2017 11/02/2016 06/29/2016 04/14/2016 03/09/2016 06/11/2015  Does Patient Have a Medical Advance Directive? Yes No Yes Yes Yes Yes Yes  Type of Paramedic of [place];Living will - [place];Living will [place];Living will [place];Living will [place];Living will [place];Living will  Does patient want to make changes to medical advance directive? - - - No - Patient declined No - Patient declined - No - Patient declined  Copy of [place] in Chart? No - copy requested - No - copy requested No - copy requested No - copy requested - No - copy requested  Would patient like information on creating a medical advance directive? - - - - - - -  Pre-existing out of facility DNR order (yellow form or pink MOST form) - - - - - - -    Tobacco Social History   Tobacco Use  Smoking Status Former Smoker  . Years: 30.00  . Types: Pipe  . Last attempt to  quit: 09/20/2006  . Years since quitting: 11.2  Smokeless Tobacco Never Used     Counseling given: Not Answered   Clinical Intake:     Pain : No/denies pain    Past Medical History:  Diagnosis Date  . Anemia   . Aortic stenosis   . Aortic stenosis, severe    S/p Edwards Sapien 3 Transcatheter Heart Valve (size 26 mm, model # [id number], serial # [id number])  . Arthritis   . Back pain   . Bursitis   . Cataract    left immature  . Cellulitis 10/12/2015  . Complication of anesthesia    Halucinations  . Constipation    takes Miralax daily as well as Senokot daily  . DDD (degenerative disc disease)   . Gout    takes Allopurinol daily  . Heart valve disorder   . History of blood clots 1962   knee  . History of blood transfusion    no abnormal reaction noted  . History of shingles   . Hyperlipidemia    takes Atorvastatin daily  . Hypertension    takes Lisinopril daily  . Joint pain   . Low back pain 01/25/2017  . Neck pain on left side 01/25/2017  . OSA (obstructive sleep apnea)   . Osteoarthritis   . Peripheral edema    takes Furosemide daily  . Peripheral neuropathy    takes Gabapentin daily  . Peroneal palsy    significant right foot drop  .  Pneumonia 25+yrs ago   hx of  . Shortness of breath   . Swelling of extremity   . Urinary frequency   . Urinary urgency   . Valvular heart disease    Past Surgical History:  Procedure Laterality Date  . CARDIAC CATHETERIZATION N/A 05/02/2015   Procedure: Right/Left Heart Cath and Coronary Angiography;  Surgeon: Burnell Blanks, MD;  Location: [place] [facility];  Service: Cardiovascular;  Laterality: N/A;  . COLONOSCOPY N/A 02/07/2013   Procedure: COLONOSCOPY;  Surgeon: Juanita Craver, MD;  Location: [facility] ENDOSCOPY;  Service: Endoscopy;  Laterality: N/A;  . COLONOSCOPY N/A 02/09/2013   Procedure: COLONOSCOPY;  Surgeon: Beryle Beams, MD;  Location: [place];  Service: Endoscopy;  Laterality: N/A;  .  ESOPHAGOGASTRODUODENOSCOPY N/A 02/07/2013   Procedure: ESOPHAGOGASTRODUODENOSCOPY (EGD);  Surgeon: Juanita Craver, MD;  Location: [facility] ENDOSCOPY;  Service: Endoscopy;  Laterality: N/A;  . GIVENS CAPSULE STUDY N/A 02/09/2013   Procedure: GIVENS CAPSULE STUDY;  Surgeon: Beryle Beams, MD;  Location: [place];  Service: Endoscopy;  Laterality: N/A;  . HERNIA REPAIR     umbilical hernia  . KNEE SURGERY Right    multiple knee surgeries due to complication of R TKA  . LAMINOTOMY  8250   c6-t2  . SPINE SURGERY    . TEE WITHOUT CARDIOVERSION N/A 03/07/2015   Procedure: TRANSESOPHAGEAL ECHOCARDIOGRAM (TEE);  Surgeon: Josue Hector, MD;  Location: [facility] ENDOSCOPY;  Service: Cardiovascular;  Laterality: N/A;  ANES TO BRING PROPOFOL PER DOCTOR  . TEE WITHOUT CARDIOVERSION N/A 06/10/2015   Procedure: TRANSESOPHAGEAL ECHOCARDIOGRAM (TEE);  Surgeon: Burnell Blanks, MD;  Location: [place];  Service: Open Heart Surgery;  Laterality: N/A;  . TOTAL KNEE ARTHROPLASTY     right  . TRANSCATHETER AORTIC VALVE REPLACEMENT, TRANSFEMORAL Left 06/10/2015   Procedure: TRANSCATHETER AORTIC VALVE REPLACEMENT, TRANSFEMORAL;  Surgeon: Burnell Blanks, MD;  Location: [place];  Service: Open Heart Surgery;  Laterality: Left;   Family History  Problem Relation Age of Onset  . Arthritis Sister        "crippling"  . Arthritis Sister    Social History   Socioeconomic History  . Marital status: Married    Spouse name: [name]  . Number of children: 3  . Years of education: Not on file  . Highest education level: Not on file  Occupational History  . Occupation: retired Investment banker, operational  . Financial resource strain: Not on file  . Food insecurity:    Worry: Not on file    Inability: Not on file  . Transportation needs:    Medical: Not on file    Non-medical: Not on file  Tobacco Use  . Smoking status: Former Smoker    Years: 30.00    Types: Pipe    Last attempt to quit: 09/20/2006    Years since  quitting: 11.2  . Smokeless tobacco: Never Used  Substance and Sexual Activity  . Alcohol use: No    Alcohol/week: 0.0 oz  . Drug use: No  . Sexual activity: Never    Comment: lives with wife, retired, no dietary restrictions  Lifestyle  . Physical activity:    Days per week: Not on file    Minutes per session: Not on file  . Stress: Not on file  Relationships  . Social connections:    Talks on phone: Not on file    Gets together: Not on file    Attends religious service: Not on file  Active member of club or organization: Not on file    Attends meetings of clubs or organizations: Not on file    Relationship status: Not on file  Other Topics Concern  . Not on file  Social History Narrative  . Not on file    Outpatient Encounter Medications as of 12/20/2017  Medication Sig  . acetaminophen (TYLENOL) 500 MG tablet Take 500 mg by mouth 2 (two) times daily.   [name] allopurinol (ZYLOPRIM) 300 MG tablet Take 1 tablet (300 mg total) by mouth daily.  [name] aspirin 81 MG EC tablet TAKE 1 TABLET (81 MG TOTAL) BY MOUTH DAILY.  [name] atorvastatin (LIPITOR) 40 MG tablet Take 1 and a half tablet daily by mouth  (60mg )  . cholecalciferol (VITAMIN D) 1000 UNITS tablet Take 2,000 Units by mouth daily.   . clopidogrel (PLAVIX) 75 MG tablet TAKE 1 TABLET (75 MG TOTAL) BY MOUTH DAILY WITH BREAKFAST.  [name] Coenzyme Q10 (COQ10) 200 MG CAPS Take 200 mg by mouth daily.  . furosemide (LASIX) 20 MG tablet TAKE 1 TABLET BY MOUTH TWICE A DAY  . gabapentin (NEURONTIN) 300 MG capsule Take 1 capsule (300 mg total) by mouth 3 (three) times daily.  [name] HEMATINIC/FOLIC ACID 621-3 MG TABS TAKE 1 TABLET BY MOUTH EVERY DAY  . lisinopril (PRINIVIL,ZESTRIL) 10 MG tablet Take 1 tablet (10 mg total) by mouth daily.  . Misc Natural Products (TART CHERRY ADVANCED PO) Take 1 capsule by mouth daily.  . Multiple Vitamin (MULTIVITAMIN WITH MINERALS) TABS Take 1 tablet by mouth daily.  . polyethylene glycol (MIRALAX / GLYCOLAX) packet  Take 17 g by mouth daily.  . sennosides-docusate sodium (SENOKOT-S) 8.6-50 MG tablet Take 1 tablet by mouth 2 (two) times daily.   No facility-administered encounter medications on file as of 12/20/2017.     Activities of Daily Living In your present state of health, do you have any difficulty performing the following activities: 12/20/2017  Hearing? N  Vision? N  Comment last eye exam 07/2017 Dr.Miller  Difficulty concentrating or making decisions? N  Walking or climbing stairs? Y  Comment in wheelchair.  Dressing or bathing? N  Doing errands, shopping? N  Comment drives using hand controls.  Preparing Food and eating ? N  Using the Toilet? N  In the past six months, have you accidently leaked urine? N  Do you have problems with loss of bowel control? N  Managing your Medications? N  Managing your Finances? Y  Comment wife   Housekeeping or managing your Housekeeping? Y  Comment wife  Some recent data might be hidden    Patient Care Team: [name], MD as PCP - General (Family Medicine) Josue Hector, MD as Consulting Physician (Cardiology)   Assessment:   This is a routine wellness examination for Lindsey. Physical assessment deferred to PCP.  Exercise Activities and Dietary recommendations Current Exercise Habits: The patient does not participate in regular exercise at present, Exercise limited by: orthopedic condition(s)   Diet (meal preparation, eat out, water intake, caffeinated beverages, dairy products, fruits and vegetables): in general, a "healthy" diet      Goals    . Weight (lb) < 350 lb (158.8 kg)     Now see Dr.Beasly-follow diet and begin exercise       Fall Risk Fall Risk  12/20/2017 11/02/2016 07/29/2016  Falls in the past year? No No No    Depression Screen PHQ 2/9 Scores 12/20/2017 12/06/2017 11/02/2016 07/29/2016  PHQ - 2 Score  0 1 0 0  PHQ- 9 Score - 8 - -    Cognitive Function Ad8 score reviewed for issues:  Issues making  decisions:no  Less interest in hobbies / activities:no  Repeats questions, stories (family complaining):no  Trouble using ordinary gadgets (microwave, computer, phone):no  Forgets the month or year: no  Mismanaging finances: no  Remembering appts:no  Daily problems with thinking and/or memory:no Ad8 score is=0     MMSE - Mini Mental State Exam 11/02/2016  Orientation to time 5  Orientation to Place 5  Registration 3  Attention/ Calculation 5  Recall 2  Language- name 2 objects 2  Language- repeat 1  Language- follow 3 step command 3  Language- read & follow direction 1  Write a sentence 1  Copy design 1  Total score 29        Immunization History  Administered Date(s) Administered  . Influenza Split 07/08/2011, 06/20/2012  . Influenza, High Dose Seasonal PF 06/22/2016, 07/06/2017  . Influenza,inj,Quad PF,6+ Mos 06/07/2013, 07/29/2014, 07/18/2015  . Influenza-Unspecified 06/26/2017  . PPD Test 02/16/2013  . Pneumococcal Conjugate-13 10/11/2013  . Pneumococcal Polysaccharide-23 11/07/2015  . Td 08/26/2006  . Zoster 07/27/2013     Screening Tests Health Maintenance  Topic Date Due  . TETANUS/TDAP  08/26/2016  . INFLUENZA VACCINE  04/20/2018  . COLONOSCOPY  02/10/2023  . PNA vac Low Risk Adult  Completed       Plan:   Follow up with Dr.Blyth as scheduled 03/28/18.   Continue to eat heart healthy diet (full of fruits, vegetables, whole grains, lean protein, water--limit salt, fat, and sugar intake) and increase physical activity as tolerated.  Continue doing brain stimulating activities (puzzles, reading, adult coloring books, staying active) to keep memory sharp.   Bring a copy of your living will and/or healthcare power of attorney to your next office visit.   I have personally reviewed and noted the following in the patient's chart:   . Medical and social history . Use of alcohol, tobacco or illicit drugs  . Current medications and  supplements . Functional ability and status . Nutritional status . Physical activity . Advanced directives . List of other physicians . Hospitalizations, surgeries, and ER visits in previous 12 months . Vitals . Screenings to include cognitive, depression, and falls . Referrals and appointments  In addition, I have reviewed and discussed with patient certain preventive protocols, quality metrics, and best practice recommendations. A written personalized care plan for preventive services as well as general preventive health recommendations were provided to patient.     Shela Nevin, [place]  12/20/2017

## 2017-12-19 NOTE — Progress Notes (Signed)
Office: [phone]  /  Fax: [phone]   HPI:   Chief Complaint: Juncos is here to discuss his progress with his obesity treatment plan. He is on the Category 3 plan and is following his eating plan approximately 100 % of the time. He states he is exercising 0 minutes 0 times per week. Axtyn struggled to follow his plan. He substituted high calorie toast instead of eggs and he didn't eat an appropriate dinner. Tracer is unstable on his feet and getting an accurate weight is difficult. He states his clothes feel looser. His weight is (!) 387 lb (175.5 kg) today and has had a weight gain of 2 pounds over a period of 2 weeks since his last visit. He has gained 2 lbs since starting treatment with [name].  Haena has a new diagnosis of pre-diabetes. He has a history of elevated fasting glucose and A1c. His fasting insulin is elevated today and he admits polyphagia. Stevie was informed this puts him at greater risk of developing diabetes. He is not taking metformin currently and continues to work on diet and exercise to decrease risk of diabetes. He denies nausea or hypoglycemia.  Hypertension LAVERNE KLUGH is a 77 y.o. male with hypertension.  LATRAVIS GRINE denies chest pain or headache. He would like to try to improve his blood pressure with diet, in addition to his medications, with the goal of decreasing his risk of heart attack and stroke. Waynes blood pressure is controlled today on medications.  ALLERGIES: Allergies  Allergen Reactions  . Coumadin [Warfarin Sodium] Other (See Comments)    States he can't be on this-bleeds out    MEDICATIONS: Current Outpatient Medications on File Prior to Visit  Medication Sig Dispense Refill  . acetaminophen (TYLENOL) 500 MG tablet Take 500 mg by mouth 2 (two) times daily.     [name] allopurinol (ZYLOPRIM) 300 MG tablet Take 1 tablet (300 mg total) by mouth daily. 90 tablet 1  . aspirin 81 MG EC tablet TAKE 1 TABLET (81 MG TOTAL) BY  MOUTH DAILY. 30 tablet 11  . atorvastatin (LIPITOR) 40 MG tablet Take 1 and a half tablet daily by mouth  (60mg ) 135 tablet 0  . cholecalciferol (VITAMIN D) 1000 UNITS tablet Take 2,000 Units by mouth daily.     . clopidogrel (PLAVIX) 75 MG tablet TAKE 1 TABLET (75 MG TOTAL) BY MOUTH DAILY WITH BREAKFAST. 90 tablet 3  . Coenzyme Q10 (COQ10) 200 MG CAPS Take 200 mg by mouth daily.    . furosemide (LASIX) 20 MG tablet TAKE 1 TABLET BY MOUTH TWICE A DAY 180 tablet 2  . gabapentin (NEURONTIN) 300 MG capsule Take 1 capsule (300 mg total) by mouth 3 (three) times daily. 270 capsule 0  . HEMATINIC/FOLIC ACID 778-2 MG TABS TAKE 1 TABLET BY MOUTH EVERY DAY 30 each 3  . lisinopril (PRINIVIL,ZESTRIL) 10 MG tablet Take 1 tablet (10 mg total) by mouth daily. 90 tablet 1  . Misc Natural Products (TART CHERRY ADVANCED PO) Take 1 capsule by mouth daily.    . Multiple Vitamin (MULTIVITAMIN WITH MINERALS) TABS Take 1 tablet by mouth daily.    . polyethylene glycol (MIRALAX / GLYCOLAX) packet Take 17 g by mouth daily.    . sennosides-docusate sodium (SENOKOT-S) 8.6-50 MG tablet Take 1 tablet by mouth 2 (two) times daily.     No current facility-administered medications on file prior to visit.     PAST MEDICAL HISTORY: Past Medical History:  Diagnosis  Date  . Anemia   . Aortic stenosis   . Aortic stenosis, severe    S/p Edwards Sapien 3 Transcatheter Heart Valve (size 26 mm, model # [id number], serial # [id number])  . Arthritis   . Back pain   . Bursitis   . Cataract    left immature  . Cellulitis 10/12/2015  . Complication of anesthesia    Halucinations  . Constipation    takes Miralax daily as well as Senokot daily  . DDD (degenerative disc disease)   . Gout    takes Allopurinol daily  . Heart valve disorder   . History of blood clots 1962   knee  . History of blood transfusion    no abnormal reaction noted  . History of shingles   . Hyperlipidemia    takes Atorvastatin daily  . Hypertension      takes Lisinopril daily  . Joint pain   . Low back pain 01/25/2017  . Neck pain on left side 01/25/2017  . OSA (obstructive sleep apnea)   . Osteoarthritis   . Peripheral edema    takes Furosemide daily  . Peripheral neuropathy    takes Gabapentin daily  . Peroneal palsy    significant right foot drop  . Pneumonia 25+yrs ago   hx of  . Shortness of breath   . Swelling of extremity   . Urinary frequency   . Urinary urgency   . Valvular heart disease     PAST SURGICAL HISTORY: Past Surgical History:  Procedure Laterality Date  . CARDIAC CATHETERIZATION N/A 05/02/2015   Procedure: Right/Left Heart Cath and Coronary Angiography;  Surgeon: Burnell Blanks, MD;  Location: [place] [facility];  Service: Cardiovascular;  Laterality: N/A;  . COLONOSCOPY N/A 02/07/2013   Procedure: COLONOSCOPY;  Surgeon: Juanita Craver, MD;  Location: [facility] ENDOSCOPY;  Service: Endoscopy;  Laterality: N/A;  . COLONOSCOPY N/A 02/09/2013   Procedure: COLONOSCOPY;  Surgeon: Beryle Beams, MD;  Location: [place];  Service: Endoscopy;  Laterality: N/A;  . ESOPHAGOGASTRODUODENOSCOPY N/A 02/07/2013   Procedure: ESOPHAGOGASTRODUODENOSCOPY (EGD);  Surgeon: Juanita Craver, MD;  Location: [facility] ENDOSCOPY;  Service: Endoscopy;  Laterality: N/A;  . GIVENS CAPSULE STUDY N/A 02/09/2013   Procedure: GIVENS CAPSULE STUDY;  Surgeon: Beryle Beams, MD;  Location: [place];  Service: Endoscopy;  Laterality: N/A;  . HERNIA REPAIR     umbilical hernia  . KNEE SURGERY Right    multiple knee surgeries due to complication of R TKA  . LAMINOTOMY  4081   c6-t2  . SPINE SURGERY    . TEE WITHOUT CARDIOVERSION N/A 03/07/2015   Procedure: TRANSESOPHAGEAL ECHOCARDIOGRAM (TEE);  Surgeon: Josue Hector, MD;  Location: [facility] ENDOSCOPY;  Service: Cardiovascular;  Laterality: N/A;  ANES TO BRING PROPOFOL PER DOCTOR  . TEE WITHOUT CARDIOVERSION N/A 06/10/2015   Procedure: TRANSESOPHAGEAL ECHOCARDIOGRAM (TEE);  Surgeon: Burnell Blanks,  MD;  Location: [place];  Service: Open Heart Surgery;  Laterality: N/A;  . TOTAL KNEE ARTHROPLASTY     right  . TRANSCATHETER AORTIC VALVE REPLACEMENT, TRANSFEMORAL Left 06/10/2015   Procedure: TRANSCATHETER AORTIC VALVE REPLACEMENT, TRANSFEMORAL;  Surgeon: Burnell Blanks, MD;  Location: [place];  Service: Open Heart Surgery;  Laterality: Left;    SOCIAL HISTORY: Social History   Tobacco Use  . Smoking status: Former Smoker    Years: 30.00    Types: Pipe    Last attempt to quit: 09/20/2006    Years since quitting: 11.2  . Smokeless tobacco:  Never Used  Substance Use Topics  . Alcohol use: No    Alcohol/week: 0.0 oz  . Drug use: No    FAMILY HISTORY: Family History  Problem Relation Age of Onset  . Arthritis Sister        "crippling"  . Arthritis Sister     ROS: Review of Systems  Constitutional: Negative for weight loss.  Cardiovascular: Negative for chest pain.  Gastrointestinal: Negative for nausea.  Neurological: Negative for headaches.  Endo/Heme/Allergies:       Positive for polyphagia Negative for hypoglycemia    PHYSICAL EXAM: Blood pressure 127/84, pulse 63, temperature 97.7 F (36.5 C), temperature source Oral, height 6\' 2"  (1.88 m), weight (!) 387 lb (175.5 kg), SpO2 95 %. Body mass index is 49.69 kg/m. Physical Exam  Constitutional: He is oriented to person, place, and time. He appears well-developed and well-nourished.  Cardiovascular: Normal rate.  Pulmonary/Chest: Effort normal.  Musculoskeletal: Normal range of motion.  Neurological: He is oriented to person, place, and time.  Skin: Skin is warm and dry.  Psychiatric: He has a normal mood and affect. His behavior is normal.  Vitals reviewed.   RECENT LABS AND TESTS: BMET    Component Value Date/Time   NA 140 11/24/2017 1633   K 5.1 11/24/2017 1633   CL 100 11/24/2017 1633   CO2 34 (H) 11/24/2017 1633   GLUCOSE 106 (H) 11/24/2017 1633   BUN 32 (H) 11/24/2017 1633   CREATININE 1.36  11/24/2017 1633   CREATININE 1.19 02/28/2015 1548   CALCIUM 9.8 11/24/2017 1633   GFRNONAA 40 (L) 02/04/2017 1229   GFRAA 46 (L) 02/04/2017 1229   Lab Results  Component Value Date   HGBA1C 5.5 12/06/2017   HGBA1C 5.8 11/24/2017   HGBA1C 6.0 07/28/2017   HGBA1C 5.7 (H) 06/06/2015   Lab Results  Component Value Date   INSULIN 21.5 12/06/2017   CBC    Component Value Date/Time   WBC 6.7 11/24/2017 1633   RBC 3.78 (L) 11/24/2017 1633   HGB 12.1 (L) 11/24/2017 1633   HCT 37.6 (L) 11/24/2017 1633   PLT 136.0 (L) 11/24/2017 1633   MCV 99.4 11/24/2017 1633   MCH 30.5 02/04/2017 1229   MCHC 32.0 11/24/2017 1633   RDW 18.0 (H) 11/24/2017 1633   LYMPHSABS 1.3 07/28/2017 1628   MONOABS 0.5 07/28/2017 1628   EOSABS 0.1 07/28/2017 1628   BASOSABS 0.1 07/28/2017 1628   Iron/TIBC/Ferritin/ %Sat No results found for: IRON, TIBC, FERRITIN, IRONPCTSAT Lipid Panel     Component Value Date/Time   CHOL 119 11/24/2017 1633   TRIG 148.0 11/24/2017 1633   HDL 30.10 (L) 11/24/2017 1633   CHOLHDL 4 11/24/2017 1633   VLDL 29.6 11/24/2017 1633   LDLCALC 60 11/24/2017 1633   LDLDIRECT 85.0 07/28/2017 1628   Hepatic Function Panel     Component Value Date/Time   PROT 6.5 11/24/2017 1633   ALBUMIN 3.7 11/24/2017 1633   AST 21 11/24/2017 1633   ALT 18 11/24/2017 1633   ALKPHOS 76 11/24/2017 1633   BILITOT 0.3 11/24/2017 1633   BILIDIR 0.1 02/21/2015 0921   IBILI 0.4 04/19/2014 1418      Component Value Date/Time   TSH 1.71 11/24/2017 1633   TSH 1.30 07/28/2017 1628   TSH 1.18 01/25/2017 1415   Results for GREELY, ATIYEH (MRN [id number]) as of 12/19/2017 16:55  Ref. Range 12/06/2017 09:54  Vitamin D, 25-Hydroxy Latest Ref Range: 30.0 - 100.0 ng/mL 47.9   ASSESSMENT AND  PLAN: Prediabetes  Essential hypertension  Class 3 severe obesity with serious comorbidity and body mass index (BMI) of 45.0 to 49.9 in adult, unspecified obesity type [facility])  PLAN:  [place] will  continue to work on weight loss, exercise, and decreasing simple carbohydrates in his diet to help decrease the risk of diabetes. He was informed that eating too many simple carbohydrates or too many calories at one sitting increases the likelihood of GI side effects. We will defer metformin for now and will recheck labs in 3 months. Uriyah agreed to follow up with [name] as directed to monitor his progress.  Hypertension We discussed sodium restriction, working on healthy weight loss, and a regular exercise program as the means to achieve improved blood pressure control. Cordelle agreed with this plan and agreed to follow up as directed. We will continue to monitor his blood pressure as well as his progress with the above lifestyle modifications. He will continue lisinopril as prescribed and will watch for signs of hypotension as he continues his lifestyle modifications.  We spent > than 50% of the 30 minute visit on the counseling as documented in the note.  Obesity Chevis is currently in the action stage of change. As such, his goal is to continue with weight loss efforts He has agreed to follow the Category 3 Vandiver has been instructed to work up to a goal of 150 minutes of combined cardio and strengthening exercise per week for weight loss and overall health benefits. We discussed the following Behavioral Modification Strategies today: no skipping meals, increasing lean protein intake, decreasing simple carbohydrates , increasing vegetables and work on meal planning and easy cooking plans  I suspect that patients first weight was inaccurately low. We will continue to monitor his weight.  Hardeep has agreed to follow up with our clinic in 2 weeks. He was informed of the importance of frequent follow up visits to maximize his success with intensive lifestyle modifications for his multiple health conditions.   OBESITY BEHAVIORAL INTERVENTION VISIT  Today's visit was # 2 out of 22.  Starting weight:  385 lbs Starting date: 12/06/17 Today's weight : 387 lbs Today's date: 12/19/2017 Total lbs lost to date: 0 (Patients must lose 7 lbs in the first 6 months to continue with counseling)   ASK: We discussed the diagnosis of obesity with [name] today and Faust agreed to give [name] permission to discuss obesity behavioral modification therapy today.  ASSESS: Amarien has the diagnosis of obesity and his BMI today is 49.67 Padraig is in the action stage of change   ADVISE: Wissam was educated on the multiple health risks of obesity as well as the benefit of weight loss to improve his health. He was advised of the need for long term treatment and the importance of lifestyle modifications.  AGREE: Multiple dietary modification options and treatment options were discussed and  Areli agreed to the above obesity treatment plan.  Corey Skains, am acting as transcriptionist for Dennard Nip, MD

## 2017-12-20 ENCOUNTER — Ambulatory Visit (INDEPENDENT_AMBULATORY_CARE_PROVIDER_SITE_OTHER): Payer: PPO | Admitting: *Deleted

## 2017-12-20 ENCOUNTER — Encounter: Payer: Self-pay | Admitting: *Deleted

## 2017-12-20 VITALS — BP 135/66 | HR 63 | Wt 387.0 lb

## 2017-12-20 DIAGNOSIS — J9622 Acute and chronic respiratory failure with hypercapnia: Secondary | ICD-10-CM | POA: Diagnosis not present

## 2017-12-20 DIAGNOSIS — G4733 Obstructive sleep apnea (adult) (pediatric): Secondary | ICD-10-CM | POA: Diagnosis not present

## 2017-12-20 DIAGNOSIS — J449 Chronic obstructive pulmonary disease, unspecified: Secondary | ICD-10-CM | POA: Diagnosis not present

## 2017-12-20 DIAGNOSIS — R0602 Shortness of breath: Secondary | ICD-10-CM | POA: Diagnosis not present

## 2017-12-20 DIAGNOSIS — Z0000 Encounter for general adult medical examination without abnormal findings: Secondary | ICD-10-CM | POA: Diagnosis not present

## 2017-12-20 NOTE — Patient Instructions (Signed)
Follow up with Dr.Blyth as scheduled 03/28/18.   Continue to eat heart healthy diet (full of fruits, vegetables, whole grains, lean protein, water--limit salt, fat, and sugar intake) and increase physical activity as tolerated.  Continue doing brain stimulating activities (puzzles, reading, adult coloring books, staying active) to keep memory sharp.   Bring a copy of your living will and/or healthcare power of attorney to your next office visit.   [name] , Thank you for taking time to come for your Medicare Wellness Visit. I appreciate your ongoing commitment to your health goals. Please review the following plan we discussed and let me know if I can assist you in the future.   These are the goals we discussed: Goals    . Weight (lb) < 350 lb (158.8 kg)     Now see Dr.Beasly-follow diet and begin exercise       This is a list of the screening recommended for you and due dates:  Health Maintenance  Topic Date Due  . Tetanus Vaccine  08/26/2016  . Flu Shot  04/20/2018  . Colon Cancer Screening  02/10/2023  . Pneumonia vaccines  Completed   Low-Purine Diet Purines are compounds that affect the level of uric acid in your body. A low-purine diet is a diet that is low in purines. Eating a low-purine diet can prevent the level of uric acid in your body from getting too high and causing gout or kidney stones or both. What do I need to know about this diet?  Choose low-purine foods. Examples of low-purine foods are listed in the next section.  Drink plenty of fluids, especially water. Fluids can help remove uric acid from your body. Try to drink 8-16 cups (1.9-3.8 L) a day.  Limit foods high in fat, especially saturated fat, as fat makes it harder for the body to get rid of uric acid. Foods high in saturated fat include pizza, cheese, ice cream, whole milk, fried foods, and gravies. Choose foods that are lower in fat and lean sources of protein. Use olive oil when cooking as it contains  healthy fats that are not high in saturated fat.  Limit alcohol. Alcohol interferes with the elimination of uric acid from your body. If you are having a gout attack, avoid all alcohol.  Keep in mind that different people's bodies react differently to different foods. You will probably learn over time which foods do or do not affect you. If you discover that a food tends to cause your gout to flare up, avoid eating that food. You can more freely enjoy foods that do not cause problems. If you have any questions about a food item, talk to your dietitian or health care provider. Which foods are low, moderate, and high in purines? The following is a list of foods that are low, moderate, and high in purines. You can eat any amount of the foods that are low in purines. You may be able to have small amounts of foods that are moderate in purines. Ask your health care provider how much of a food moderate in purines you can have. Avoid foods high in purines. Grains  Foods low in purines: Enriched white bread, pasta, rice, cake, cornbread, popcorn.  Foods moderate in purines: Whole-grain breads and cereals, wheat germ, bran, oatmeal. Uncooked oatmeal. Dry wheat bran or wheat germ.  Foods high in purines: Pancakes, [place] toast, biscuits, muffins. Vegetables  Foods low in purines: All vegetables, except those that are moderate in purines.  Foods moderate in purines: Asparagus, cauliflower, spinach, mushrooms, green peas. Fruits  All fruits are low in purines. Meats and other Protein Foods  Foods low in purines: Eggs, nuts, peanut butter.  Foods moderate in purines: 80-90% lean beef, lamb, veal, pork, poultry, fish, eggs, peanut butter, nuts. Crab, lobster, oysters, and shrimp. Cooked dried beans, peas, and lentils.  Foods high in purines: Anchovies, sardines, herring, mussels, tuna, codfish, scallops, trout, and haddock. [name]. Organ meats (such as liver or kidney). Tripe. Game meat. Goose.  Sweetbreads. Dairy  All dairy foods are low in purines. Low-fat and fat-free dairy products are best because they are low in saturated fat. Beverages  Drinks low in purines: Water, carbonated beverages, tea, coffee, cocoa.  Drinks moderate in purines: Soft drinks and other drinks sweetened with high-fructose corn syrup. Juices. To find whether a food or drink is sweetened with high-fructose corn syrup, look at the ingredients list.  Drinks high in purines: Alcoholic beverages (such as beer). Condiments  Foods low in purines: Salt, herbs, olives, pickles, relishes, vinegar.  Foods moderate in purines: Butter, margarine, oils, mayonnaise. Fats and Oils  Foods low in purines: All types, except gravies and sauces made with meat.  Foods high in purines: Gravies and sauces made with meat. Other Foods  Foods low in purines: Sugars, sweets, gelatin. Cake. Soups made without meat.  Foods moderate in purines: Meat-based or fish-based soups, broths, or bouillons. Foods and drinks sweetened with high-fructose corn syrup.  Foods high in purines: High-fat desserts (such as ice cream, cookies, cakes, pies, doughnuts, and chocolate). Contact your dietitian for more information on foods that are not listed here. This information is not intended to replace advice given to you by your health care provider. Make sure you discuss any questions you have with your health care provider. Document Released: 01/01/2011 Document Revised: 02/12/2016 Document Reviewed: 08/13/2013 Elsevier Interactive Patient Education  2017 [place] Maintenance, Male A healthy lifestyle and preventive care is important for your health and wellness. Ask your health care provider about what schedule of regular examinations is right for you. What should I know about weight and diet? Eat a Healthy Diet  Eat plenty of vegetables, fruits, whole grains, low-fat dairy products, and lean protein.  Do not eat a lot of  foods high in solid fats, added sugars, or salt.  Maintain a Healthy Weight Regular exercise can help you achieve or maintain a healthy weight. You should:  Do at least 150 minutes of exercise each week. The exercise should increase your heart rate and make you sweat (moderate-intensity exercise).  Do strength-training exercises at least twice a week.  Watch Your Levels of Cholesterol and Blood Lipids  Have your blood tested for lipids and cholesterol every 5 years starting at 76 years of age. If you are at high risk for heart disease, you should start having your blood tested when you are 76 years old. You may need to have your cholesterol levels checked more often if: ? Your lipid or cholesterol levels are high. ? You are older than 76 years of age. ? You are at high risk for heart disease.  What should I know about cancer screening? Many types of cancers can be detected early and may often be prevented. Lung Cancer  You should be screened every year for lung cancer if: ? You are a current smoker who has smoked for at least 30 years. ? You are a former smoker who has quit within the past  15 years.  Talk to your health care provider about your screening options, when you should start screening, and how often you should be screened.  Colorectal Cancer  Routine colorectal cancer screening usually begins at 76 years of age and should be repeated every 5-10 years until you are 76 years old. You may need to be screened more often if early forms of precancerous polyps or small growths are found. Your health care provider may recommend screening at an earlier age if you have risk factors for colon cancer.  Your health care provider may recommend using home test kits to check for hidden blood in the stool.  A small camera at the end of a tube can be used to examine your colon (sigmoidoscopy or colonoscopy). This checks for the earliest forms of colorectal cancer.  Prostate and Testicular  Cancer  Depending on your age and overall health, your health care provider may do certain tests to screen for prostate and testicular cancer.  Talk to your health care provider about any symptoms or concerns you have about testicular or prostate cancer.  Skin Cancer  Check your skin from head to toe regularly.  Tell your health care provider about any new moles or changes in moles, especially if: ? There is a change in a mole's size, shape, or color. ? You have a mole that is larger than a pencil eraser.  Always use sunscreen. Apply sunscreen liberally and repeat throughout the day.  Protect yourself by wearing long sleeves, pants, a wide-brimmed hat, and sunglasses when outside.  What should I know about heart disease, diabetes, and high blood pressure?  If you are 31-82 years of age, have your blood pressure checked every 3-5 years. If you are 35 years of age or older, have your blood pressure checked every year. You should have your blood pressure measured twice-once when you are at a hospital or clinic, and once when you are not at a hospital or clinic. Record the average of the two measurements. To check your blood pressure when you are not at a hospital or clinic, you can use: ? An automated blood pressure machine at a pharmacy. ? A home blood pressure monitor.  Talk to your health care provider about your target blood pressure.  If you are between 52-60 years old, ask your health care provider if you should take aspirin to prevent heart disease.  Have regular diabetes screenings by checking your fasting blood sugar level. ? If you are at a normal weight and have a low risk for diabetes, have this test once every three years after the age of 44. ? If you are overweight and have a high risk for diabetes, consider being tested at a younger age or more often.  A one-time screening for abdominal aortic aneurysm (AAA) by ultrasound is recommended for men aged 72-75 years who are  current or former smokers. What should I know about preventing infection? Hepatitis B If you have a higher risk for hepatitis B, you should be screened for this virus. Talk with your health care provider to find out if you are at risk for hepatitis B infection. Hepatitis C Blood testing is recommended for:  Everyone born from [birth year] through 1964.  Anyone with known risk factors for hepatitis C.  Sexually Transmitted Diseases (STDs)  You should be screened each year for STDs including gonorrhea and chlamydia if: ? You are sexually active and are younger than 76 years of age. ? You are older than  76 years of age and your health care provider tells you that you are at risk for this type of infection. ? Your sexual activity has changed since you were last screened and you are at an increased risk for chlamydia or gonorrhea. Ask your health care provider if you are at risk.  Talk with your health care provider about whether you are at high risk of being infected with HIV. Your health care provider may recommend a prescription medicine to help prevent HIV infection.  What else can I do?  Schedule regular health, dental, and eye exams.  Stay current with your vaccines (immunizations).  Do not use any tobacco products, such as cigarettes, chewing tobacco, and e-cigarettes. If you need help quitting, ask your health care provider.  Limit alcohol intake to no more than 2 drinks per day. One drink equals 12 ounces of beer, 5 ounces of wine, or 1 ounces of hard liquor.  Do not use street drugs.  Do not share needles.  Ask your health care provider for help if you need support or information about quitting drugs.  Tell your health care provider if you often feel depressed.  Tell your health care provider if you have ever been abused or do not feel safe at home. This information is not intended to replace advice given to you by your health care provider. Make sure you discuss any questions  you have with your health care provider. Document Released: 03/04/2008 Document Revised: 05/05/2016 Document Reviewed: 06/10/2015 Elsevier Interactive Patient Education  [employer].

## 2018-01-02 ENCOUNTER — Ambulatory Visit: Payer: PPO | Admitting: Internal Medicine

## 2018-01-02 ENCOUNTER — Encounter: Payer: Self-pay | Admitting: Internal Medicine

## 2018-01-02 DIAGNOSIS — G4733 Obstructive sleep apnea (adult) (pediatric): Secondary | ICD-10-CM

## 2018-01-02 NOTE — Patient Instructions (Signed)
We can continue BIPAP 12/12, PS4, with O2 2L for sleep, mask of choice, humidifier supplies, AirView   We suggested you call [facility] to talk with them about your travel plans   Please call if we can  help

## 2018-01-02 NOTE — Progress Notes (Signed)
HPI M former smoker followed for OSA, COPD, chronic hypoxic respiratory failure complicated by morbid obesity, OHS, aortic stenosis/ AVR,  Osteoarthritis, Anemia PFT 05/05/2015-severe obstructive airways disease, insignificant response to bronchodilator, severe restriction, moderate diffusion defect FVC 2.17/46%, FEV1 1.50/44%, ratio 0.69, TLC 64%, DLCO 57% with volume correction to 62% of predicted NPSG 06/21/85- AHI 98/hour with desaturation to 64% BiPAP titration study-21/17, PS 4 cwp O2 2L sleep, with residual AHI 52 "unknown" events, either obstructive or central and presumably mostly RERAs.  ------------------------------------------------------------  07/04/17- 77 year old male former smoker followed for OSA, complicated by morbid obesity, aortic stenosis/aVR, osteoarthritis BiPAP 23/17, PS 4/ O2 2L sleep/[facility]; DME: [facility]. Pt wears BiPAP nightly. DL attached.  Wife here today says he does use BiPAP compliantly and it mostly prevents snoring. Download 100% compliance averaging over 8 hours per night, with oxygen bleed in. He is not getting adequate control-residual AHI 84.7/hour. There is some leak at these high pressures. We discussed possible rental of a portable oxygen concentrator to provide oxygen if he travels. He tried sleeping off of oxygen and felt the difference-sleeps better with it. He chose not to try Ritalin for daytime sleepiness and still dozes off and on during the daytime as he sits watching TV.  01/02/18- 77 year old male former smoker followed for OSA, complicated by morbid obesity, aortic stenosis/aVR, osteoarthritis BiPAP 12/12, PS 4/ O2 2L sleep/[facility] ---OSA;DME: [facility]. Pt wears BiPAP nightly and DL attached.  Download 100% compliance AHI 0.7/hour.  He is comfortable with his BiPAP oxygen combination, sleeping well.  He asks about travel out of town and how to manage the oxygen.  I have asked him to discuss this with his DME company. He denies new medical  concerns. Now on a weight loss program through [facility] for the past month and is strongly encouraged to stick with it.  ROS- see HPI  + = positive Constitutional:   No-   weight loss, night sweats, fevers, chills, +fatigue, lassitude. HEENT:   No-  headaches, difficulty swallowing, tooth/dental problems, sore throat,       No-  sneezing, itching, ear ache, nasal congestion, post nasal drip,  CV:  No-   chest pain, orthopnea, PND, swelling in lower extremities, anasarca,  dizziness, palpitations Resp: +shortness of breath with exertion or at rest.              No-   productive cough,  No non-productive cough,  No- coughing up of blood.              No-   change in color of mucus.  No- wheezing.   Skin: No-   rash or lesions. GI:  No-   heartburn, indigestion, abdominal pain, nausea, vomiting, GU:  MS:  No-   joint pain or swelling.   Neuro-     nothing unusual Psych:  No- change in mood or affect. No depression or anxiety.  No memory loss.  OBJ General- Alert, Oriented, Affect-appropriate, Distress- none acute. +Morbidly obese, +power wheelchair  Skin- rash-none, lesions- none, excoriation- none Lymphadenopathy- none Head- atraumatic            Eyes- Gross vision intact, PERRLA, conjunctivae clear secretions            Ears- Hearing, canals-normal            Nose- Clear, no-Septal dev, mucus, polyps, erosion, perforation             Throat- Mallampati III-IV , mucosa clear , drainage- none, tonsils- atrophic  Neck- flexible , trachea midline, no stridor , thyroid nl, carotid no bruit Chest - symmetrical excursion , unlabored           Heart/CV- RRR , murmur-none, no gallop, no rub, nl s1 s2                           - JVD- none , edema+, stasis changes+, varices- none           Lung- clear to P&A, wheeze- none, cough- none , dullness-none, rub- none, no valve click heard                           Room air saturation while awake and sitting upright on arrival today 91%           Chest  wall-  Abd-  Br/ Gen/ Rectal- Not done, not indicated Extrem- cyanosis- none, clubbing, none, atrophy- none, strength- nl Neuro- grossly intact to observation, alert and pleasant

## 2018-01-02 NOTE — Assessment & Plan Note (Signed)
Now doing well with BiPAP 12/12 with oxygen 2 L.  No changes requested.  Download and his wife both confirm good compliance and he sleeps better using it.  He will contact DME about travel.

## 2018-01-02 NOTE — Assessment & Plan Note (Signed)
I am very pleased that he is motivated to participate in weight loss diet programs.  He is encouraged to keep that it.  He says he is not working towards bariatric surgery.

## 2018-01-03 ENCOUNTER — Ambulatory Visit (INDEPENDENT_AMBULATORY_CARE_PROVIDER_SITE_OTHER): Payer: PPO | Admitting: Family Medicine

## 2018-01-03 VITALS — BP 108/69 | HR 59 | Temp 98.2°F | Ht 74.0 in | Wt 380.0 lb

## 2018-01-03 DIAGNOSIS — R7303 Prediabetes: Secondary | ICD-10-CM

## 2018-01-03 DIAGNOSIS — Z6841 Body Mass Index (BMI) 40.0 and over, adult: Secondary | ICD-10-CM

## 2018-01-03 NOTE — Progress Notes (Signed)
Office: [phone]  /  Fax: [phone]   HPI:   Chief Complaint: [name] is here to discuss his progress with his obesity treatment plan. He is on the Category 3 plan and is following his eating plan approximately 99 % of the time. He states he is exercising 0 minutes 0 times per week. Laythan continues to do well with weight loss. Hunger is mostly controlled but is getting bored with lunch and would like to eat out more.  His weight is (!) 380 lb (172.4 kg) today and has had a weight loss of 7 pounds over a period of 2 weeks since his last visit. He has lost 5 lbs since starting treatment with [name].  [name] has a diagnosis of pre-diabetes based on his elevated Hgb A1c and was informed this puts him at greater risk of developing diabetes. He is doing well on his decreasing simple carbohydrates diet with with weight loss efforts. He has multiple questions about food options to help prevent diabetes mellitus. He denies nausea or hypoglycemia.  ALLERGIES: Allergies  Allergen Reactions  . Coumadin [Warfarin Sodium] Other (See Comments)    States he can't be on this-bleeds out    MEDICATIONS: Current Outpatient Medications on File Prior to Visit  Medication Sig Dispense Refill  . acetaminophen (TYLENOL) 500 MG tablet Take 500 mg by mouth 2 (two) times daily.     [name] allopurinol (ZYLOPRIM) 300 MG tablet Take 1 tablet (300 mg total) by mouth daily. 90 tablet 1  . aspirin 81 MG EC tablet TAKE 1 TABLET (81 MG TOTAL) BY MOUTH DAILY. 30 tablet 11  . atorvastatin (LIPITOR) 40 MG tablet Take 1 and a half tablet daily by mouth  (60mg ) 135 tablet 0  . cholecalciferol (VITAMIN D) 1000 UNITS tablet Take 2,000 Units by mouth daily.     . clopidogrel (PLAVIX) 75 MG tablet TAKE 1 TABLET (75 MG TOTAL) BY MOUTH DAILY WITH BREAKFAST. 90 tablet 3  . Coenzyme Q10 (COQ10) 200 MG CAPS Take 200 mg by mouth daily.    . furosemide (LASIX) 20 MG tablet TAKE 1 TABLET BY MOUTH TWICE A DAY 180 tablet  2  . gabapentin (NEURONTIN) 300 MG capsule Take 1 capsule (300 mg total) by mouth 3 (three) times daily. 270 capsule 0  . HEMATINIC/FOLIC ACID 333-5 MG TABS TAKE 1 TABLET BY MOUTH EVERY DAY 30 each 3  . lisinopril (PRINIVIL,ZESTRIL) 10 MG tablet Take 1 tablet (10 mg total) by mouth daily. 90 tablet 1  . Misc Natural Products (TART CHERRY ADVANCED PO) Take 1 capsule by mouth daily.    . Multiple Vitamin (MULTIVITAMIN WITH MINERALS) TABS Take 1 tablet by mouth daily.    . polyethylene glycol (MIRALAX / GLYCOLAX) packet Take 17 g by mouth daily.    . sennosides-docusate sodium (SENOKOT-S) 8.6-50 MG tablet Take 1 tablet by mouth 2 (two) times daily.     No current facility-administered medications on file prior to visit.     PAST MEDICAL HISTORY: Past Medical History:  Diagnosis Date  . Anemia   . Aortic stenosis   . Aortic stenosis, severe    S/p Edwards Sapien 3 Transcatheter Heart Valve (size 26 mm, model # [id number], serial # [id number])  . Arthritis   . Back pain   . Bursitis   . Cataract    left immature  . Cellulitis 10/12/2015  . Complication of anesthesia    Halucinations  . Constipation    takes Miralax daily as well  as Senokot daily  . DDD (degenerative disc disease)   . Gout    takes Allopurinol daily  . Heart valve disorder   . History of blood clots 1962   knee  . History of blood transfusion    no abnormal reaction noted  . History of shingles   . Hyperlipidemia    takes Atorvastatin daily  . Hypertension    takes Lisinopril daily  . Joint pain   . Low back pain 01/25/2017  . Neck pain on left side 01/25/2017  . OSA (obstructive sleep apnea)   . Osteoarthritis   . Peripheral edema    takes Furosemide daily  . Peripheral neuropathy    takes Gabapentin daily  . Peroneal palsy    significant right foot drop  . Pneumonia 25+yrs ago   hx of  . Shortness of breath   . Swelling of extremity   . Urinary frequency   . Urinary urgency   . Valvular heart disease      PAST SURGICAL HISTORY: Past Surgical History:  Procedure Laterality Date  . CARDIAC CATHETERIZATION N/A 05/02/2015   Procedure: Right/Left Heart Cath and Coronary Angiography;  Surgeon: Burnell Blanks, MD;  Location: [place] CV LAB;  Service: Cardiovascular;  Laterality: N/A;  . COLONOSCOPY N/A 02/07/2013   Procedure: COLONOSCOPY;  Surgeon: Juanita Craver, MD;  Location[facility] ENDOSCOPY;  Service: Endoscopy;  Laterality: N/A;  . COLONOSCOPY N/A 02/09/2013   Procedure: COLONOSCOPY;  Surgeon: Beryle Beams, MD;  Location: [place];  Service: Endoscopy;  Laterality: N/A;  . ESOPHAGOGASTRODUODENOSCOPY N/A 02/07/2013   Procedure: ESOPHAGOGASTRODUODENOSCOPY (EGD);  Surgeon: Juanita Craver, MD;  Location: [facility] ENDOSCOPY;  Service: Endoscopy;  Laterality: N/A;  . GIVENS CAPSULE STUDY N/A 02/09/2013   Procedure: GIVENS CAPSULE STUDY;  Surgeon: Beryle Beams, MD;  Location: [place];  Service: Endoscopy;  Laterality: N/A;  . HERNIA REPAIR     umbilical hernia  . KNEE SURGERY Right    multiple knee surgeries due to complication of R TKA  . LAMINOTOMY  5188   c6-t2  . SPINE SURGERY    . TEE WITHOUT CARDIOVERSION N/A 03/07/2015   Procedure: TRANSESOPHAGEAL ECHOCARDIOGRAM (TEE);  Surgeon: Josue Hector, MD;  Location: [facility] ENDOSCOPY;  Service: Cardiovascular;  Laterality: N/A;  ANES TO BRING PROPOFOL PER DOCTOR  . TEE WITHOUT CARDIOVERSION N/A 06/10/2015   Procedure: TRANSESOPHAGEAL ECHOCARDIOGRAM (TEE);  Surgeon: Burnell Blanks, MD;  Location: [place];  Service: Open Heart Surgery;  Laterality: N/A;  . TOTAL KNEE ARTHROPLASTY     right  . TRANSCATHETER AORTIC VALVE REPLACEMENT, TRANSFEMORAL Left 06/10/2015   Procedure: TRANSCATHETER AORTIC VALVE REPLACEMENT, TRANSFEMORAL;  Surgeon: Burnell Blanks, MD;  Location: [place];  Service: Open Heart Surgery;  Laterality: Left;    SOCIAL HISTORY: Social History   Tobacco Use  . Smoking status: Former Smoker    Years: 30.00    Types:  Pipe    Last attempt to quit: 09/20/2006    Years since quitting: 11.2  . Smokeless tobacco: Never Used  Substance Use Topics  . Alcohol use: No    Alcohol/week: 0.0 oz  . Drug use: No    FAMILY HISTORY: Family History  Problem Relation Age of Onset  . Arthritis Sister        "crippling"  . Arthritis Sister     ROS: Review of Systems  Constitutional: Positive for weight loss.  Gastrointestinal: Negative for nausea.  Endo/Heme/Allergies:       Negative hypoglycemia  PHYSICAL EXAM: Blood pressure 108/69, pulse (!) 59, temperature 98.2 F (36.8 C), temperature source Oral, height 6\' 2"  (1.88 m), weight (!) 380 lb (172.4 kg), SpO2 92 %. Body mass index is 48.79 kg/m. Physical Exam  Constitutional: He is oriented to person, place, and time. He appears well-developed and well-nourished.  Cardiovascular: Normal rate.  Pulmonary/Chest: Effort normal.  Musculoskeletal: Normal range of motion.  Neurological: He is oriented to person, place, and time.  Skin: Skin is warm and dry.  Psychiatric: He has a normal mood and affect. His behavior is normal.  Vitals reviewed.   RECENT LABS AND TESTS: BMET    Component Value Date/Time   NA 140 11/24/2017 1633   K 5.1 11/24/2017 1633   CL 100 11/24/2017 1633   CO2 34 (H) 11/24/2017 1633   GLUCOSE 106 (H) 11/24/2017 1633   BUN 32 (H) 11/24/2017 1633   CREATININE 1.36 11/24/2017 1633   CREATININE 1.19 02/28/2015 1548   CALCIUM 9.8 11/24/2017 1633   GFRNONAA 40 (L) 02/04/2017 1229   GFRAA 46 (L) 02/04/2017 1229   Lab Results  Component Value Date   HGBA1C 5.5 12/06/2017   HGBA1C 5.8 11/24/2017   HGBA1C 6.0 07/28/2017   HGBA1C 5.7 (H) 06/06/2015   Lab Results  Component Value Date   INSULIN 21.5 12/06/2017   CBC    Component Value Date/Time   WBC 6.7 11/24/2017 1633   RBC 3.78 (L) 11/24/2017 1633   HGB 12.1 (L) 11/24/2017 1633   HCT 37.6 (L) 11/24/2017 1633   PLT 136.0 (L) 11/24/2017 1633   MCV 99.4 11/24/2017 1633     MCH 30.5 02/04/2017 1229   MCHC 32.0 11/24/2017 1633   RDW 18.0 (H) 11/24/2017 1633   LYMPHSABS 1.3 07/28/2017 1628   MONOABS 0.5 07/28/2017 1628   EOSABS 0.1 07/28/2017 1628   BASOSABS 0.1 07/28/2017 1628   Iron/TIBC/Ferritin/ %Sat No results found for: IRON, TIBC, FERRITIN, IRONPCTSAT Lipid Panel     Component Value Date/Time   CHOL 119 11/24/2017 1633   TRIG 148.0 11/24/2017 1633   HDL 30.10 (L) 11/24/2017 1633   CHOLHDL 4 11/24/2017 1633   VLDL 29.6 11/24/2017 1633   LDLCALC 60 11/24/2017 1633   LDLDIRECT 85.0 07/28/2017 1628   Hepatic Function Panel     Component Value Date/Time   PROT 6.5 11/24/2017 1633   ALBUMIN 3.7 11/24/2017 1633   AST 21 11/24/2017 1633   ALT 18 11/24/2017 1633   ALKPHOS 76 11/24/2017 1633   BILITOT 0.3 11/24/2017 1633   BILIDIR 0.1 02/21/2015 0921   IBILI 0.4 04/19/2014 1418      Component Value Date/Time   TSH 1.71 11/24/2017 1633   TSH 1.30 07/28/2017 1628   TSH 1.18 01/25/2017 1415    ASSESSMENT AND PLAN: Prediabetes  Class 3 severe obesity with serious comorbidity and body mass index (BMI) of 45.0 to 49.9 in adult, unspecified obesity type ([place])  PLAN:  Byron will continue to work on weight loss, diet, exercise, and decreasing simple carbohydrates in his diet to help decrease the risk of diabetes. We dicussed metformin including benefits and risks. He was informed that eating too many simple carbohydrates or too many calories at one sitting increases the likelihood of GI side effects. Javontae declined metformin for now and a prescription was not written today. We will recheck labs in 3 months and Jonnie agrees to follow up with our clinic in 3 weeks as directed to monitor his progress.  We spent > than  50% of the 30 minute visit on the counseling as documented in the note.  Obesity Gad is currently in the action stage of change. As such, his goal is to continue with weight loss efforts He has agreed to keep a food  journal with 400-550 calories and 35 grams of protein at lunch daily and follow the Category [place] has been instructed to work up to a goal of 150 minutes of combined cardio and strengthening exercise per week for weight loss and overall health benefits. We discussed the following Behavioral Modification Strategies today: increasing lean protein intake, decreasing simple carbohydrates, increasing vegetables, work on meal planning and easy cooking plans, and increase H20 intake   Cardin has agreed to follow up with our clinic in 3 weeks. He was informed of the importance of frequent follow up visits to maximize his success with intensive lifestyle modifications for his multiple health conditions.   OBESITY BEHAVIORAL INTERVENTION VISIT  Today's visit was # 3 out of 22.  Starting weight: 385 lbs Starting date: 12/06/17 Today's weight : 380 lbs   Today's date: 01/03/2018 Total lbs lost to date: 5 (Patients must lose 7 lbs in the first 6 months to continue with counseling)   ASK: We discussed the diagnosis of obesity with [name] today and Michaelangelo agreed to give [name] permission to discuss obesity behavioral modification therapy today.  ASSESS: Fitzpatrick has the diagnosis of obesity and his BMI today is 48.77 Blaike is in the action stage of change   ADVISE: Servando was educated on the multiple health risks of obesity as well as the benefit of weight loss to improve his health. He was advised of the need for long term treatment and the importance of lifestyle modifications.  AGREE: Multiple dietary modification options and treatment options were discussed and  Juanmiguel agreed to the above obesity treatment plan.  I, Trixie Dredge, am acting as transcriptionist for Dennard Nip, MD  I have reviewed the above documentation for accuracy and completeness, and I agree with the above. -Dennard Nip, MD

## 2018-01-14 DIAGNOSIS — R0602 Shortness of breath: Secondary | ICD-10-CM | POA: Diagnosis not present

## 2018-01-14 DIAGNOSIS — G4733 Obstructive sleep apnea (adult) (pediatric): Secondary | ICD-10-CM | POA: Diagnosis not present

## 2018-01-19 DIAGNOSIS — J9622 Acute and chronic respiratory failure with hypercapnia: Secondary | ICD-10-CM | POA: Diagnosis not present

## 2018-01-19 DIAGNOSIS — J449 Chronic obstructive pulmonary disease, unspecified: Secondary | ICD-10-CM | POA: Diagnosis not present

## 2018-01-19 DIAGNOSIS — G4733 Obstructive sleep apnea (adult) (pediatric): Secondary | ICD-10-CM | POA: Diagnosis not present

## 2018-01-19 DIAGNOSIS — R0602 Shortness of breath: Secondary | ICD-10-CM | POA: Diagnosis not present

## 2018-01-23 ENCOUNTER — Ambulatory Visit (INDEPENDENT_AMBULATORY_CARE_PROVIDER_SITE_OTHER): Payer: PPO | Admitting: Family Medicine

## 2018-01-25 ENCOUNTER — Ambulatory Visit (INDEPENDENT_AMBULATORY_CARE_PROVIDER_SITE_OTHER): Payer: PPO | Admitting: Family Medicine

## 2018-01-25 VITALS — BP 106/62 | HR 67 | Temp 98.2°F | Ht 74.0 in | Wt 377.0 lb

## 2018-01-25 DIAGNOSIS — Z6841 Body Mass Index (BMI) 40.0 and over, adult: Secondary | ICD-10-CM

## 2018-01-25 DIAGNOSIS — R7303 Prediabetes: Secondary | ICD-10-CM | POA: Diagnosis not present

## 2018-01-25 NOTE — Progress Notes (Signed)
Office: [phone]  /  Fax: [phone]   HPI:   Chief Complaint: [place] is here to discuss his progress with his obesity treatment plan. He is on the keep a food journal with 400-550 calories and 35 grams of protein at lunch daily and follow the Category 3 plan and is following his eating plan approximately 97 % of the time. He states he is walking in the house 10 times per day. Aakash continues to do well with weight loss but is disappointed that his weight loss isn't faster. He is unable to ambulate well, so exercise is limited. He admits to moving around more comfortable with his weight loss.  His weight is (!) 377 lb (171 kg) today and has had a weight loss of 3 pounds over a period of 3 weeks since his last visit. He has lost 8 lbs since starting treatment with [name].  [place] has a diagnosis of pre-diabetes based on his elevated Hgb A1c and was informed this puts him at greater risk of developing diabetes. He is doing well with diet prescription and weight loss efforts. He is working on decreasing simple carbohydrates. He still notes polyphagia but usually when he doesn't eat al his meal plan. He is not taking metformin currently and continues to work on diet and exercise to decrease risk of diabetes. He denies nausea or hypoglycemia.  ALLERGIES: Allergies  Allergen Reactions  . Coumadin [Warfarin Sodium] Other (See Comments)    States he can't be on this-bleeds out    MEDICATIONS: Current Outpatient Medications on File Prior to Visit  Medication Sig Dispense Refill  . acetaminophen (TYLENOL) 500 MG tablet Take 500 mg by mouth 2 (two) times daily.     [name] allopurinol (ZYLOPRIM) 300 MG tablet Take 1 tablet (300 mg total) by mouth daily. 90 tablet 1  . aspirin 81 MG EC tablet TAKE 1 TABLET (81 MG TOTAL) BY MOUTH DAILY. 30 tablet 11  . atorvastatin (LIPITOR) 40 MG tablet Take 1 and a half tablet daily by mouth  (60mg ) 135 tablet 0  . cholecalciferol (VITAMIN D) 1000  UNITS tablet Take 2,000 Units by mouth daily.     . clopidogrel (PLAVIX) 75 MG tablet TAKE 1 TABLET (75 MG TOTAL) BY MOUTH DAILY WITH BREAKFAST. 90 tablet 3  . Coenzyme Q10 (COQ10) 200 MG CAPS Take 200 mg by mouth daily.    . furosemide (LASIX) 20 MG tablet TAKE 1 TABLET BY MOUTH TWICE A DAY 180 tablet 2  . gabapentin (NEURONTIN) 300 MG capsule Take 1 capsule (300 mg total) by mouth 3 (three) times daily. 270 capsule 0  . HEMATINIC/FOLIC ACID 401-0 MG TABS TAKE 1 TABLET BY MOUTH EVERY DAY 30 each 3  . lisinopril (PRINIVIL,ZESTRIL) 10 MG tablet Take 1 tablet (10 mg total) by mouth daily. 90 tablet 1  . Misc Natural Products (TART CHERRY ADVANCED PO) Take 1 capsule by mouth daily.    . Multiple Vitamin (MULTIVITAMIN WITH MINERALS) TABS Take 1 tablet by mouth daily.    . polyethylene glycol (MIRALAX / GLYCOLAX) packet Take 17 g by mouth daily.    . sennosides-docusate sodium (SENOKOT-S) 8.6-50 MG tablet Take 1 tablet by mouth 2 (two) times daily.     No current facility-administered medications on file prior to visit.     PAST MEDICAL HISTORY: Past Medical History:  Diagnosis Date  . Anemia   . Aortic stenosis   . Aortic stenosis, severe    S/p Edwards Sapien 3 Transcatheter  Heart Valve (size 26 mm, model # [id number], serial # [id number])  . Arthritis   . Back pain   . Bursitis   . Cataract    left immature  . Cellulitis 10/12/2015  . Complication of anesthesia    Halucinations  . Constipation    takes Miralax daily as well as Senokot daily  . DDD (degenerative disc disease)   . Gout    takes Allopurinol daily  . Heart valve disorder   . History of blood clots 1962   knee  . History of blood transfusion    no abnormal reaction noted  . History of shingles   . Hyperlipidemia    takes Atorvastatin daily  . Hypertension    takes Lisinopril daily  . Joint pain   . Low back pain 01/25/2017  . Neck pain on left side 01/25/2017  . OSA (obstructive sleep apnea)   . Osteoarthritis   .  Peripheral edema    takes Furosemide daily  . Peripheral neuropathy    takes Gabapentin daily  . Peroneal palsy    significant right foot drop  . Pneumonia 25+yrs ago   hx of  . Shortness of breath   . Swelling of extremity   . Urinary frequency   . Urinary urgency   . Valvular heart disease     PAST SURGICAL HISTORY: Past Surgical History:  Procedure Laterality Date  . CARDIAC CATHETERIZATION N/A 05/02/2015   Procedure: Right/Left Heart Cath and Coronary Angiography;  Surgeon: Burnell Blanks, MD;  Location: [place] CV LAB;  Service: Cardiovascular;  Laterality: N/A;  . COLONOSCOPY N/A 02/07/2013   Procedure: COLONOSCOPY;  Surgeon: Juanita Craver, MD;  Location: [facility] ENDOSCOPY;  Service: Endoscopy;  Laterality: N/A;  . COLONOSCOPY N/A 02/09/2013   Procedure: COLONOSCOPY;  Surgeon: Beryle Beams, MD;  Location: [place];  Service: Endoscopy;  Laterality: N/A;  . ESOPHAGOGASTRODUODENOSCOPY N/A 02/07/2013   Procedure: ESOPHAGOGASTRODUODENOSCOPY (EGD);  Surgeon: Juanita Craver, MD;  Location: [facility] ENDOSCOPY;  Service: Endoscopy;  Laterality: N/A;  . GIVENS CAPSULE STUDY N/A 02/09/2013   Procedure: GIVENS CAPSULE STUDY;  Surgeon: Beryle Beams, MD;  Location: [place];  Service: Endoscopy;  Laterality: N/A;  . HERNIA REPAIR     umbilical hernia  . KNEE SURGERY Right    multiple knee surgeries due to complication of R TKA  . LAMINOTOMY  3785   c6-t2  . SPINE SURGERY    . TEE WITHOUT CARDIOVERSION N/A 03/07/2015   Procedure: TRANSESOPHAGEAL ECHOCARDIOGRAM (TEE);  Surgeon: Josue Hector, MD;  Location: [facility] ENDOSCOPY;  Service: Cardiovascular;  Laterality: N/A;  ANES TO BRING PROPOFOL PER DOCTOR  . TEE WITHOUT CARDIOVERSION N/A 06/10/2015   Procedure: TRANSESOPHAGEAL ECHOCARDIOGRAM (TEE);  Surgeon: Burnell Blanks, MD;  Location: [place];  Service: Open Heart Surgery;  Laterality: N/A;  . TOTAL KNEE ARTHROPLASTY     right  . TRANSCATHETER AORTIC VALVE REPLACEMENT,  TRANSFEMORAL Left 06/10/2015   Procedure: TRANSCATHETER AORTIC VALVE REPLACEMENT, TRANSFEMORAL;  Surgeon: Burnell Blanks, MD;  Location: [place];  Service: Open Heart Surgery;  Laterality: Left;    SOCIAL HISTORY: Social History   Tobacco Use  . Smoking status: Former Smoker    Years: 30.00    Types: Pipe    Last attempt to quit: 09/20/2006    Years since quitting: 11.3  . Smokeless tobacco: Never Used  Substance Use Topics  . Alcohol use: No    Alcohol/week: 0.0 oz  . Drug use: No  FAMILY HISTORY: Family History  Problem Relation Age of Onset  . Arthritis Sister        "crippling"  . Arthritis Sister     ROS: Review of Systems  Constitutional: Positive for weight loss.  Gastrointestinal: Negative for nausea.  Endo/Heme/Allergies:       Positive polyphagia Negative hypoglycemia    PHYSICAL EXAM: Blood pressure 106/62, pulse 67, temperature 98.2 F (36.8 C), temperature source Oral, height 6\' 2"  (1.88 m), weight (!) 377 lb (171 kg), SpO2 94 %. Body mass index is 48.4 kg/m. Physical Exam  Constitutional: He is oriented to person, place, and time. He appears well-developed and well-nourished.  Cardiovascular: Normal rate.  Pulmonary/Chest: Effort normal.  Musculoskeletal: Normal range of motion.  Neurological: He is oriented to person, place, and time.  Skin: Skin is warm and dry.  Psychiatric: He has a normal mood and affect. His behavior is normal.  Vitals reviewed.   RECENT LABS AND TESTS: BMET    Component Value Date/Time   NA 140 11/24/2017 1633   K 5.1 11/24/2017 1633   CL 100 11/24/2017 1633   CO2 34 (H) 11/24/2017 1633   GLUCOSE 106 (H) 11/24/2017 1633   BUN 32 (H) 11/24/2017 1633   CREATININE 1.36 11/24/2017 1633   CREATININE 1.19 02/28/2015 1548   CALCIUM 9.8 11/24/2017 1633   GFRNONAA 40 (L) 02/04/2017 1229   GFRAA 46 (L) 02/04/2017 1229   Lab Results  Component Value Date   HGBA1C 5.5 12/06/2017   HGBA1C 5.8 11/24/2017   HGBA1C  6.0 07/28/2017   HGBA1C 5.7 (H) 06/06/2015   Lab Results  Component Value Date   INSULIN 21.5 12/06/2017   CBC    Component Value Date/Time   WBC 6.7 11/24/2017 1633   RBC 3.78 (L) 11/24/2017 1633   HGB 12.1 (L) 11/24/2017 1633   HCT 37.6 (L) 11/24/2017 1633   PLT 136.0 (L) 11/24/2017 1633   MCV 99.4 11/24/2017 1633   MCH 30.5 02/04/2017 1229   MCHC 32.0 11/24/2017 1633   RDW 18.0 (H) 11/24/2017 1633   LYMPHSABS 1.3 07/28/2017 1628   MONOABS 0.5 07/28/2017 1628   EOSABS 0.1 07/28/2017 1628   BASOSABS 0.1 07/28/2017 1628   Iron/TIBC/Ferritin/ %Sat No results found for: IRON, TIBC, FERRITIN, IRONPCTSAT Lipid Panel     Component Value Date/Time   CHOL 119 11/24/2017 1633   TRIG 148.0 11/24/2017 1633   HDL 30.10 (L) 11/24/2017 1633   CHOLHDL 4 11/24/2017 1633   VLDL 29.6 11/24/2017 1633   LDLCALC 60 11/24/2017 1633   LDLDIRECT 85.0 07/28/2017 1628   Hepatic Function Panel     Component Value Date/Time   PROT 6.5 11/24/2017 1633   ALBUMIN 3.7 11/24/2017 1633   AST 21 11/24/2017 1633   ALT 18 11/24/2017 1633   ALKPHOS 76 11/24/2017 1633   BILITOT 0.3 11/24/2017 1633   BILIDIR 0.1 02/21/2015 0921   IBILI 0.4 04/19/2014 1418      Component Value Date/Time   TSH 1.71 11/24/2017 1633   TSH 1.30 07/28/2017 1628   TSH 1.18 01/25/2017 1415    ASSESSMENT AND PLAN: Prediabetes  Class 3 severe obesity with serious comorbidity and body mass index (BMI) of 45.0 to 49.9 in adult, unspecified obesity type ([place])  PLAN:  [place] will continue to work on weight loss, diet, exercise, and decreasing simple carbohydrates in his diet to help decrease the risk of diabetes. We dicussed metformin including benefits and risks. He was informed that eating too many  simple carbohydrates or too many calories at one sitting increases the likelihood of GI side effects. Bruk declined metformin for now and a prescription was not written today. Hollie agrees to follow up with our  clinic in 2 weeks as directed to monitor his progress.  We spent > than 50% of the 15 minute visit on the counseling as documented in the note.  Obesity Bishop is currently in the action stage of change. As such, his goal is to continue with weight loss efforts He has agreed to keep a food journal with 400-550 calories and 35+ grams of protein at lunch daily and follow the Category [place] has been instructed to work up to a goal of 150 minutes of combined cardio and strengthening exercise per week for weight loss and overall health benefits. We discussed the following Behavioral Modification Strategies today: increasing lean protein intake, increasing vegetables, and keeping healthy foods in the home   Iden has agreed to follow up with our clinic in 2 weeks. He was informed of the importance of frequent follow up visits to maximize his success with intensive lifestyle modifications for his multiple health conditions.   OBESITY BEHAVIORAL INTERVENTION VISIT  Today's visit was # 4 out of 22.  Starting weight: 385 lbs Starting date: 12/06/17 Today's weight : 377 lbs  Today's date: 01/25/2018 Total lbs lost to date: 8 (Patients must lose 7 lbs in the first 6 months to continue with counseling)   ASK: We discussed the diagnosis of obesity with [name] today and Cristobal agreed to give [name] permission to discuss obesity behavioral modification therapy today.  ASSESS: Kristine has the diagnosis of obesity and his BMI today is 96.38 Koy is in the action stage of change   ADVISE: [place] was educated on the multiple health risks of obesity as well as the benefit of weight loss to improve his health. He was advised of the need for long term treatment and the importance of lifestyle modifications.  AGREE: Multiple dietary modification options and treatment options were discussed and  Javen agreed to the above obesity treatment plan.  I, Trixie Dredge, am acting as transcriptionist for  Dennard Nip, MD  I have reviewed the above documentation for accuracy and completeness, and I agree with the above. -Dennard Nip, MD

## 2018-02-07 ENCOUNTER — Ambulatory Visit (INDEPENDENT_AMBULATORY_CARE_PROVIDER_SITE_OTHER): Payer: PPO | Admitting: Family Medicine

## 2018-02-07 DIAGNOSIS — J029 Acute pharyngitis, unspecified: Secondary | ICD-10-CM | POA: Diagnosis not present

## 2018-02-08 ENCOUNTER — Ambulatory Visit (INDEPENDENT_AMBULATORY_CARE_PROVIDER_SITE_OTHER): Payer: PPO | Admitting: Family Medicine

## 2018-02-08 VITALS — BP 111/71 | HR 60 | Temp 97.6°F | Ht 74.0 in | Wt 375.0 lb

## 2018-02-08 DIAGNOSIS — Z6841 Body Mass Index (BMI) 40.0 and over, adult: Secondary | ICD-10-CM

## 2018-02-08 DIAGNOSIS — R7303 Prediabetes: Secondary | ICD-10-CM | POA: Diagnosis not present

## 2018-02-08 NOTE — Progress Notes (Signed)
Office: [phone]  /  Fax: [phone]   HPI:   Chief Complaint: [name] is here to discuss his progress with his obesity treatment plan. He is on the keep a food journal with 400-550 calories and 35+ grams of protein at lunch daily and follow the Category 3 plan and is following his eating plan approximately 85 % of the time. He states he is exercising 0 minutes 0 times per week. [name] continues to lose weight at a slow steady pace. He is eating out at lunch but working to keep his calories and protein at goal. He is snacking more though, especially in the evening.  His weight is (!) 375 lb (170.1 kg) today and has had a weight loss of 2 pounds over a period of 2 weeks since his last visit. He has lost 10 lbs since starting treatment with [name].  [name] has a diagnosis of pre-diabetes based on his elevated Hgb A1c and was informed this puts him at greater risk of developing diabetes. He is not on metformin, doing well with diet and still notes some polyphagia. He continues to work on diet and exercise to decrease risk of diabetes. He denies nausea or hypoglycemia.  ALLERGIES: Allergies  Allergen Reactions  . Coumadin [Warfarin Sodium] Other (See Comments)    States he can't be on this-bleeds out    MEDICATIONS: Current Outpatient Medications on File Prior to Visit  Medication Sig Dispense Refill  . acetaminophen (TYLENOL) 500 MG tablet Take 500 mg by mouth 2 (two) times daily.     [name] allopurinol (ZYLOPRIM) 300 MG tablet Take 1 tablet (300 mg total) by mouth daily. 90 tablet 1  . aspirin 81 MG EC tablet TAKE 1 TABLET (81 MG TOTAL) BY MOUTH DAILY. 30 tablet 11  . atorvastatin (LIPITOR) 40 MG tablet Take 1 and a half tablet daily by mouth  (60mg ) 135 tablet 0  . cholecalciferol (VITAMIN D) 1000 UNITS tablet Take 2,000 Units by mouth daily.     . clopidogrel (PLAVIX) 75 MG tablet TAKE 1 TABLET (75 MG TOTAL) BY MOUTH DAILY WITH BREAKFAST. 90 tablet 3  . Coenzyme Q10  (COQ10) 200 MG CAPS Take 200 mg by mouth daily.    . furosemide (LASIX) 20 MG tablet TAKE 1 TABLET BY MOUTH TWICE A DAY 180 tablet 2  . gabapentin (NEURONTIN) 300 MG capsule Take 1 capsule (300 mg total) by mouth 3 (three) times daily. 270 capsule 0  . HEMATINIC/FOLIC ACID 631-4 MG TABS TAKE 1 TABLET BY MOUTH EVERY DAY 30 each 3  . lisinopril (PRINIVIL,ZESTRIL) 10 MG tablet Take 1 tablet (10 mg total) by mouth daily. 90 tablet 1  . Misc Natural Products (TART CHERRY ADVANCED PO) Take 1 capsule by mouth daily.    . Multiple Vitamin (MULTIVITAMIN WITH MINERALS) TABS Take 1 tablet by mouth daily.    . polyethylene glycol (MIRALAX / GLYCOLAX) packet Take 17 g by mouth daily.    . sennosides-docusate sodium (SENOKOT-S) 8.6-50 MG tablet Take 1 tablet by mouth 2 (two) times daily.     No current facility-administered medications on file prior to visit.     PAST MEDICAL HISTORY: Past Medical History:  Diagnosis Date  . Anemia   . Aortic stenosis   . Aortic stenosis, severe    S/p Edwards Sapien 3 Transcatheter Heart Valve (size 26 mm, model # [id number], serial # [id number])  . Arthritis   . Back pain   . Bursitis   . Cataract  left immature  . Cellulitis 10/12/2015  . Complication of anesthesia    Halucinations  . Constipation    takes Miralax daily as well as Senokot daily  . DDD (degenerative disc disease)   . Gout    takes Allopurinol daily  . Heart valve disorder   . History of blood clots 1962   knee  . History of blood transfusion    no abnormal reaction noted  . History of shingles   . Hyperlipidemia    takes Atorvastatin daily  . Hypertension    takes Lisinopril daily  . Joint pain   . Low back pain 01/25/2017  . Neck pain on left side 01/25/2017  . OSA (obstructive sleep apnea)   . Osteoarthritis   . Peripheral edema    takes Furosemide daily  . Peripheral neuropathy    takes Gabapentin daily  . Peroneal palsy    significant right foot drop  . Pneumonia 25+yrs ago    hx of  . Shortness of breath   . Swelling of extremity   . Urinary frequency   . Urinary urgency   . Valvular heart disease     PAST SURGICAL HISTORY: Past Surgical History:  Procedure Laterality Date  . CARDIAC CATHETERIZATION N/A 05/02/2015   Procedure: Right/Left Heart Cath and Coronary Angiography;  Surgeon: Burnell Blanks, MD;  Location: [place] [facility];  Service: Cardiovascular;  Laterality: N/A;  . COLONOSCOPY N/A 02/07/2013   Procedure: COLONOSCOPY;  Surgeon: Juanita Craver, MD;  Location: [facility] ENDOSCOPY;  Service: Endoscopy;  Laterality: N/A;  . COLONOSCOPY N/A 02/09/2013   Procedure: COLONOSCOPY;  Surgeon: Beryle Beams, MD;  Location: [place];  Service: Endoscopy;  Laterality: N/A;  . ESOPHAGOGASTRODUODENOSCOPY N/A 02/07/2013   Procedure: ESOPHAGOGASTRODUODENOSCOPY (EGD);  Surgeon: Juanita Craver, MD;  Location: [facility] ENDOSCOPY;  Service: Endoscopy;  Laterality: N/A;  . GIVENS CAPSULE STUDY N/A 02/09/2013   Procedure: GIVENS CAPSULE STUDY;  Surgeon: Beryle Beams, MD;  Location: [place];  Service: Endoscopy;  Laterality: N/A;  . HERNIA REPAIR     umbilical hernia  . KNEE SURGERY Right    multiple knee surgeries due to complication of R TKA  . LAMINOTOMY  [id number]   c6-t2  . SPINE SURGERY    . TEE WITHOUT CARDIOVERSION N/A 03/07/2015   Procedure: TRANSESOPHAGEAL ECHOCARDIOGRAM (TEE);  Surgeon: Josue Hector, MD;  Location: [facility] ENDOSCOPY;  Service: Cardiovascular;  Laterality: N/A;  ANES TO BRING PROPOFOL PER DOCTOR  . TEE WITHOUT CARDIOVERSION N/A 06/10/2015   Procedure: TRANSESOPHAGEAL ECHOCARDIOGRAM (TEE);  Surgeon: Burnell Blanks, MD;  Location: [place];  Service: Open Heart Surgery;  Laterality: N/A;  . TOTAL KNEE ARTHROPLASTY     right  . TRANSCATHETER AORTIC VALVE REPLACEMENT, TRANSFEMORAL Left 06/10/2015   Procedure: TRANSCATHETER AORTIC VALVE REPLACEMENT, TRANSFEMORAL;  Surgeon: Burnell Blanks, MD;  Location: [place];  Service: Open Heart Surgery;   Laterality: Left;    SOCIAL HISTORY: Social History   Tobacco Use  . Smoking status: Former Smoker    Years: 30.00    Types: Pipe    Last attempt to quit: 09/20/2006    Years since quitting: 11.3  . Smokeless tobacco: Never Used  Substance Use Topics  . Alcohol use: No    Alcohol/week: 0.0 oz  . Drug use: No    FAMILY HISTORY: Family History  Problem Relation Age of Onset  . Arthritis Sister        "crippling"  . Arthritis Sister  ROS: Review of Systems  Constitutional: Positive for weight loss.  Endo/Heme/Allergies:       Positive polyphagia Negative hypoglycemia    PHYSICAL EXAM: Blood pressure 111/71, pulse 60, temperature 97.6 F (36.4 C), temperature source Oral, height 6\' 2"  (1.88 m), weight (!) 375 lb (170.1 kg), SpO2 95 %. Body mass index is 48.15 kg/m. Physical Exam  Constitutional: He is oriented to person, place, and time. He appears well-developed and well-nourished.  Cardiovascular: Normal rate.  Pulmonary/Chest: Effort normal.  Musculoskeletal: Normal range of motion.  Neurological: He is oriented to person, place, and time.  Skin: Skin is warm and dry.  Psychiatric: He has a normal mood and affect. His behavior is normal.  Vitals reviewed.   RECENT LABS AND TESTS: BMET    Component Value Date/Time   NA 140 11/24/2017 1633   K 5.1 11/24/2017 1633   CL 100 11/24/2017 1633   CO2 34 (H) 11/24/2017 1633   GLUCOSE 106 (H) 11/24/2017 1633   BUN 32 (H) 11/24/2017 1633   CREATININE 1.36 11/24/2017 1633   CREATININE 1.19 02/28/2015 1548   CALCIUM 9.8 11/24/2017 1633   GFRNONAA 40 (L) 02/04/2017 1229   GFRAA 46 (L) 02/04/2017 1229   Lab Results  Component Value Date   HGBA1C 5.5 12/06/2017   HGBA1C 5.8 11/24/2017   HGBA1C 6.0 07/28/2017   HGBA1C 5.7 (H) 06/06/2015   Lab Results  Component Value Date   INSULIN 21.5 12/06/2017   CBC    Component Value Date/Time   WBC 6.7 11/24/2017 1633   RBC 3.78 (L) 11/24/2017 1633   HGB 12.1 (L)  11/24/2017 1633   HCT 37.6 (L) 11/24/2017 1633   PLT 136.0 (L) 11/24/2017 1633   MCV 99.4 11/24/2017 1633   MCH 30.5 02/04/2017 1229   MCHC 32.0 11/24/2017 1633   RDW 18.0 (H) 11/24/2017 1633   LYMPHSABS 1.3 07/28/2017 1628   MONOABS 0.5 07/28/2017 1628   EOSABS 0.1 07/28/2017 1628   BASOSABS 0.1 07/28/2017 1628   Iron/TIBC/Ferritin/ %Sat No results found for: IRON, TIBC, FERRITIN, IRONPCTSAT Lipid Panel     Component Value Date/Time   CHOL 119 11/24/2017 1633   TRIG 148.0 11/24/2017 1633   HDL 30.10 (L) 11/24/2017 1633   CHOLHDL 4 11/24/2017 1633   VLDL 29.6 11/24/2017 1633   LDLCALC 60 11/24/2017 1633   LDLDIRECT 85.0 07/28/2017 1628   Hepatic Function Panel     Component Value Date/Time   PROT 6.5 11/24/2017 1633   ALBUMIN 3.7 11/24/2017 1633   AST 21 11/24/2017 1633   ALT 18 11/24/2017 1633   ALKPHOS 76 11/24/2017 1633   BILITOT 0.3 11/24/2017 1633   BILIDIR 0.1 02/21/2015 0921   IBILI 0.4 04/19/2014 1418      Component Value Date/Time   TSH 1.71 11/24/2017 1633   TSH 1.30 07/28/2017 1628   TSH 1.18 01/25/2017 1415    ASSESSMENT AND PLAN: Prediabetes  Class 3 severe obesity with serious comorbidity and body mass index (BMI) of 45.0 to 49.9 in adult, unspecified obesity type ([name])  PLAN:  [place] will continue to work on weight loss, exercise, and decreasing simple carbohydrates in his diet to help decrease the risk of diabetes. We dicussed metformin including benefits and risks. He was informed that eating too many simple carbohydrates or too many calories at one sitting increases the likelihood of GI side effects. We discussed metformin but [name] defers again and a prescription was not written today. [name] agrees to follow up with  our clinic in 3 weeks as directed to monitor his progress.  We spent > than 50% of the 30 minute visit on the counseling as documented in the note.  Obesity [name] is currently in the action stage of change. As such,  his goal is to continue with weight loss efforts He has agreed to keep a food journal with 400-550 calories and 35+ grams of protein at lunch daily and follow the Category [name] has been instructed to work up to a goal of 150 minutes of combined cardio and strengthening exercise per week for weight loss and overall health benefits. We discussed the following Behavioral Modification Strategies today: increasing lean protein intake, decreasing simple carbohydrates  and decrease eating out   [name] has agreed to follow up with our clinic in 3 weeks. He was informed of the importance of frequent follow up visits to maximize his success with intensive lifestyle modifications for his multiple health conditions.   OBESITY BEHAVIORAL INTERVENTION VISIT  Today's visit was # 5 out of 22.  Starting weight: 385 lbs Starting date: 12/06/17 Today's weight : 375 lbs Today's date: 02/08/2018 Total lbs lost to date: 10 (Patients must lose 7 lbs in the first 6 months to continue with counseling)   ASK: We discussed the diagnosis of obesity with [name] today and [name] agreed to give [name] permission to discuss obesity behavioral modification therapy today.  ASSESS: Kahli has the diagnosis of obesity and his BMI today is 48.13 Jabriel is in the action stage of change   ADVISE: Keena was educated on the multiple health risks of obesity as well as the benefit of weight loss to improve his health. He was advised of the need for long term treatment and the importance of lifestyle modifications.  AGREE: Multiple dietary modification options and treatment options were discussed and  [place] agreed to the above obesity treatment plan.  I, [name], am acting as transcriptionist for Lance Nip, MD  I have reviewed the above documentation for accuracy and completeness, and I agree with the above. -Lance Nip, MD

## 2018-02-09 ENCOUNTER — Telehealth (INDEPENDENT_AMBULATORY_CARE_PROVIDER_SITE_OTHER): Payer: Self-pay | Admitting: Family Medicine

## 2018-02-09 ENCOUNTER — Encounter (INDEPENDENT_AMBULATORY_CARE_PROVIDER_SITE_OTHER): Payer: Self-pay

## 2018-02-09 NOTE — Telephone Encounter (Signed)
Patient called, stated that when he was seen yesterday that he and the physician discussed a diet supplement pill to help suppress his appetite.  He declined it yesertday, but has decided that he would like to try it.  [facility] [place].  [phone]

## 2018-02-13 DIAGNOSIS — G4733 Obstructive sleep apnea (adult) (pediatric): Secondary | ICD-10-CM | POA: Diagnosis not present

## 2018-02-13 DIAGNOSIS — R0602 Shortness of breath: Secondary | ICD-10-CM | POA: Diagnosis not present

## 2018-02-14 ENCOUNTER — Telehealth: Payer: Self-pay | Admitting: Internal Medicine

## 2018-02-14 NOTE — Telephone Encounter (Signed)
Spoke with pt, he states he wanted to let CY know that his machine would be down because [facility] stated they would have to ordere another sim card for his machine. I advised him I would let CY know and document this for insurance purposes just in case they are wondering if he is compliant. FYI CY  Current Outpatient Medications on File Prior to Visit  Medication Sig Dispense Refill  . acetaminophen (TYLENOL) 500 MG tablet Take 500 mg by mouth 2 (two) times daily.     [name] allopurinol (ZYLOPRIM) 300 MG tablet Take 1 tablet (300 mg total) by mouth daily. 90 tablet 1  . aspirin 81 MG EC tablet TAKE 1 TABLET (81 MG TOTAL) BY MOUTH DAILY. 30 tablet 11  . atorvastatin (LIPITOR) 40 MG tablet Take 1 and a half tablet daily by mouth  (60mg ) 135 tablet 0  . cholecalciferol (VITAMIN D) 1000 UNITS tablet Take 2,000 Units by mouth daily.     . clopidogrel (PLAVIX) 75 MG tablet TAKE 1 TABLET (75 MG TOTAL) BY MOUTH DAILY WITH BREAKFAST. 90 tablet 3  . Coenzyme Q10 (COQ10) 200 MG CAPS Take 200 mg by mouth daily.    . furosemide (LASIX) 20 MG tablet TAKE 1 TABLET BY MOUTH TWICE A DAY 180 tablet 2  . gabapentin (NEURONTIN) 300 MG capsule Take 1 capsule (300 mg total) by mouth 3 (three) times daily. 270 capsule 0  . HEMATINIC/FOLIC ACID 833-3 MG TABS TAKE 1 TABLET BY MOUTH EVERY DAY 30 each 3  . lisinopril (PRINIVIL,ZESTRIL) 10 MG tablet Take 1 tablet (10 mg total) by mouth daily. 90 tablet 1  . Misc Natural Products (TART CHERRY ADVANCED PO) Take 1 capsule by mouth daily.    . Multiple Vitamin (MULTIVITAMIN WITH MINERALS) TABS Take 1 tablet by mouth daily.    . polyethylene glycol (MIRALAX / GLYCOLAX) packet Take 17 g by mouth daily.    . sennosides-docusate sodium (SENOKOT-S) 8.6-50 MG tablet Take 1 tablet by mouth 2 (two) times daily.     No current facility-administered medications on file prior to visit.    Allergies  Allergen Reactions  . Coumadin [Warfarin Sodium] Other (See Comments)    States he can't be on  this-bleeds out

## 2018-02-14 NOTE — Telephone Encounter (Signed)
Ok

## 2018-02-15 ENCOUNTER — Encounter (INDEPENDENT_AMBULATORY_CARE_PROVIDER_SITE_OTHER): Payer: Self-pay

## 2018-02-15 NOTE — Telephone Encounter (Signed)
Sent the patient a my chart message. [name], [name]

## 2018-02-19 DIAGNOSIS — R0602 Shortness of breath: Secondary | ICD-10-CM | POA: Diagnosis not present

## 2018-02-19 DIAGNOSIS — J449 Chronic obstructive pulmonary disease, unspecified: Secondary | ICD-10-CM | POA: Diagnosis not present

## 2018-02-19 DIAGNOSIS — J9622 Acute and chronic respiratory failure with hypercapnia: Secondary | ICD-10-CM | POA: Diagnosis not present

## 2018-02-19 DIAGNOSIS — G4733 Obstructive sleep apnea (adult) (pediatric): Secondary | ICD-10-CM | POA: Diagnosis not present

## 2018-03-02 ENCOUNTER — Other Ambulatory Visit: Payer: Self-pay

## 2018-03-02 MED ORDER — GABAPENTIN 300 MG PO CAPS: 300.0000 mg | ORAL_CAPSULE | Freq: Three times a day (TID) | ORAL | 1 refills | Status: DC

## 2018-03-06 ENCOUNTER — Ambulatory Visit (INDEPENDENT_AMBULATORY_CARE_PROVIDER_SITE_OTHER): Payer: PPO | Admitting: Family Medicine

## 2018-03-16 ENCOUNTER — Ambulatory Visit (INDEPENDENT_AMBULATORY_CARE_PROVIDER_SITE_OTHER): Payer: PPO | Admitting: Family Medicine

## 2018-03-16 VITALS — BP 95/63 | HR 79 | Temp 98.4°F | Ht 74.0 in | Wt 372.0 lb

## 2018-03-16 DIAGNOSIS — R7303 Prediabetes: Secondary | ICD-10-CM | POA: Diagnosis not present

## 2018-03-16 DIAGNOSIS — I10 Essential (primary) hypertension: Secondary | ICD-10-CM | POA: Diagnosis not present

## 2018-03-16 DIAGNOSIS — R0602 Shortness of breath: Secondary | ICD-10-CM | POA: Diagnosis not present

## 2018-03-16 DIAGNOSIS — Z6841 Body Mass Index (BMI) 40.0 and over, adult: Secondary | ICD-10-CM | POA: Diagnosis not present

## 2018-03-16 DIAGNOSIS — G4733 Obstructive sleep apnea (adult) (pediatric): Secondary | ICD-10-CM | POA: Diagnosis not present

## 2018-03-16 NOTE — Progress Notes (Addendum)
Office: [phone]  /  Fax: [phone]   HPI:   Chief Complaint: [name] is here to discuss his progress with his obesity treatment plan. He is on the  keep a food journal with 400 to 550 calories and 35+ grams of protein at lunch daily and follow the Category 3 plan and is following his eating plan approximately 85 % of the time. He states he is exercising 0 minutes 0 times per week. Ned reports he is following the plan, but his wife reports he is not getting all his meat in. Jonpaul likes the meal plan otherwise. 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 His weight is (!) 372 lb (168.7 kg) today and has had a weight loss of 2 pounds over a period of 5 weeks since his last visit. He has lost 13 lbs since starting treatment with [name].  Hypertension DAREY HERSHBERGER is a 77 y.o. male with hypertension. His blood pressure is low today. Si Raider Beckner denies dizziness or lightheadedness. He is working weight loss to help control his blood pressure with the goal of decreasing his risk of heart attack and stroke. Waynes blood pressure is not currently controlled.  P[place]mont has a diagnosis of prediabetes based on his elevated Hgb A1c and was informed this puts him at greater risk of developing diabetes. He is not taking metformin currently and continues to work on diet and exercise to decrease risk of diabetes. He admits carb cravings with sweets and denies nausea or hypoglycemia.  ALLERGIES: Allergies  Allergen Reactions  . Coumadin [Warfarin Sodium] Other (See Comments)    States he can't be on this-bleeds out    MEDICATIONS: Current Outpatient Medications on File Prior to Visit  Medication Sig Dispense Refill  . acetaminophen (TYLENOL) 500 MG tablet Take 500 mg by mouth 2 (two) times daily.     [name] allopurinol (ZYLOPRIM) 300 MG tablet Take 1 tablet (300 mg total) by mouth daily. 90 tablet 1  . aspirin 81 MG EC tablet TAKE 1 TABLET (81 MG TOTAL) BY  MOUTH DAILY. 30 tablet 11  . atorvastatin (LIPITOR) 40 MG tablet Take 1 and a half tablet daily by mouth  (60mg ) 135 tablet 0  . cholecalciferol (VITAMIN D) 1000 UNITS tablet Take 2,000 Units by mouth daily.     . clopidogrel (PLAVIX) 75 MG tablet TAKE 1 TABLET (75 MG TOTAL) BY MOUTH DAILY WITH BREAKFAST. 90 tablet 3  . Coenzyme Q10 (COQ10) 200 MG CAPS Take 200 mg by mouth daily.    . furosemide (LASIX) 20 MG tablet TAKE 1 TABLET BY MOUTH TWICE A DAY 180 tablet 2  . gabapentin (NEURONTIN) 300 MG capsule Take 1 capsule (300 mg total) by mouth 3 (three) times daily. 270 capsule 1  . HEMATINIC/FOLIC ACID 740-8 MG TABS TAKE 1 TABLET BY MOUTH EVERY DAY 30 each 3  . lisinopril (PRINIVIL,ZESTRIL) 10 MG tablet Take 1 tablet (10 mg total) by mouth daily. 90 tablet 1  . Misc Natural Products (TART CHERRY ADVANCED PO) Take 1 capsule by mouth daily.    . Multiple Vitamin (MULTIVITAMIN WITH MINERALS) TABS Take 1 tablet by mouth daily.    . polyethylene glycol (MIRALAX / GLYCOLAX) packet Take 17 g by mouth daily.    . sennosides-docusate sodium (SENOKOT-S) 8.6-50 MG tablet Take 1 tablet by mouth 2 (two) times daily.     No current facility-administered medications on file prior to visit.     PAST MEDICAL  HISTORY: Past Medical History:  Diagnosis Date  . Anemia   . Aortic stenosis   . Aortic stenosis, severe    S/p Edwards Sapien 3 Transcatheter Heart Valve (size 26 mm, model # [id number], serial # [id number])  . Arthritis   . Back pain   . Bursitis   . Cataract    left immature  . Cellulitis 10/12/2015  . Complication of anesthesia    Halucinations  . Constipation    takes Miralax daily as well as Senokot daily  . DDD (degenerative disc disease)   . Gout    takes Allopurinol daily  . Heart valve disorder   . History of blood clots 1962   knee  . History of blood transfusion    no abnormal reaction noted  . History of shingles   . Hyperlipidemia    takes Atorvastatin daily  . Hypertension      takes Lisinopril daily  . Joint pain   . Low back pain 01/25/2017  . Neck pain on left side 01/25/2017  . OSA (obstructive sleep apnea)   . Osteoarthritis   . Peripheral edema    takes Furosemide daily  . Peripheral neuropathy    takes Gabapentin daily  . Peroneal palsy    significant right foot drop  . Pneumonia 25+yrs ago   hx of  . Shortness of breath   . Swelling of extremity   . Urinary frequency   . Urinary urgency   . Valvular heart disease     PAST SURGICAL HISTORY: Past Surgical History:  Procedure Laterality Date  . CARDIAC CATHETERIZATION N/A 05/02/2015   Procedure: Right/Left Heart Cath and Coronary Angiography;  Surgeon: Burnell Blanks, MD;  Location: [place] [facility];  Service: Cardiovascular;  Laterality: N/A;  . COLONOSCOPY N/A 02/07/2013   Procedure: COLONOSCOPY;  Surgeon: Juanita Craver, MD;  Location: [facility] ENDOSCOPY;  Service: Endoscopy;  Laterality: N/A;  . COLONOSCOPY N/A 02/09/2013   Procedure: COLONOSCOPY;  Surgeon: Beryle Beams, MD;  Location: [place];  Service: Endoscopy;  Laterality: N/A;  . ESOPHAGOGASTRODUODENOSCOPY N/A 02/07/2013   Procedure: ESOPHAGOGASTRODUODENOSCOPY (EGD);  Surgeon: Juanita Craver, MD;  Location: [facility] ENDOSCOPY;  Service: Endoscopy;  Laterality: N/A;  . GIVENS CAPSULE STUDY N/A 02/09/2013   Procedure: GIVENS CAPSULE STUDY;  Surgeon: Beryle Beams, MD;  Location: [place];  Service: Endoscopy;  Laterality: N/A;  . HERNIA REPAIR     umbilical hernia  . KNEE SURGERY Right    multiple knee surgeries due to complication of R TKA  . LAMINOTOMY  9528   c6-t2  . SPINE SURGERY    . TEE WITHOUT CARDIOVERSION N/A 03/07/2015   Procedure: TRANSESOPHAGEAL ECHOCARDIOGRAM (TEE);  Surgeon: Josue Hector, MD;  Location: [facility] ENDOSCOPY;  Service: Cardiovascular;  Laterality: N/A;  ANES TO BRING PROPOFOL PER DOCTOR  . TEE WITHOUT CARDIOVERSION N/A 06/10/2015   Procedure: TRANSESOPHAGEAL ECHOCARDIOGRAM (TEE);  Surgeon: Burnell Blanks,  MD;  Location: [place];  Service: Open Heart Surgery;  Laterality: N/A;  . TOTAL KNEE ARTHROPLASTY     right  . TRANSCATHETER AORTIC VALVE REPLACEMENT, TRANSFEMORAL Left 06/10/2015   Procedure: TRANSCATHETER AORTIC VALVE REPLACEMENT, TRANSFEMORAL;  Surgeon: Burnell Blanks, MD;  Location: [place];  Service: Open Heart Surgery;  Laterality: Left;    SOCIAL HISTORY: Social History   Tobacco Use  . Smoking status: Former Smoker    Years: 30.00    Types: Pipe    Last attempt to quit: 09/20/2006    Years since  quitting: 11.4  . Smokeless tobacco: Never Used  Substance Use Topics  . Alcohol use: No    Alcohol/week: 0.0 oz  . Drug use: No    FAMILY HISTORY: Family History  Problem Relation Age of Onset  . Arthritis Sister        "crippling"  . Arthritis Sister     ROS: Review of Systems  Constitutional: Positive for weight loss.  Gastrointestinal: Negative for nausea.  Neurological: Negative for dizziness.       Negative for lightheadedness  Endo/Heme/Allergies:       Positive for carb cravings Negative for hypoglycemia    PHYSICAL EXAM: Blood pressure 95/63, pulse 79, temperature 98.4 F (36.9 C), height 6\' 2"  (1.88 m), weight (!) 372 lb (168.7 kg), SpO2 95 %. Body mass index is 47.76 kg/m. Physical Exam  Constitutional: He is oriented to person, place, and time. He appears well-developed and well-nourished.  Cardiovascular: Normal rate.  Pulmonary/Chest: Effort normal.  Musculoskeletal: Normal range of motion.  Neurological: He is oriented to person, place, and time.  Skin: Skin is warm and dry.  Psychiatric: He has a normal mood and affect. His behavior is normal.  Vitals reviewed.   RECENT LABS AND TESTS: BMET    Component Value Date/Time   NA 140 11/24/2017 1633   K 5.1 11/24/2017 1633   CL 100 11/24/2017 1633   CO2 34 (H) 11/24/2017 1633   GLUCOSE 106 (H) 11/24/2017 1633   BUN 32 (H) 11/24/2017 1633   CREATININE 1.36 11/24/2017 1633   CREATININE  1.19 02/28/2015 1548   CALCIUM 9.8 11/24/2017 1633   GFRNONAA 40 (L) 02/04/2017 1229   GFRAA 46 (L) 02/04/2017 1229   Lab Results  Component Value Date   HGBA1C 5.5 12/06/2017   HGBA1C 5.8 11/24/2017   HGBA1C 6.0 07/28/2017   HGBA1C 5.7 (H) 06/06/2015   Lab Results  Component Value Date   INSULIN 21.5 12/06/2017   CBC    Component Value Date/Time   WBC 6.7 11/24/2017 1633   RBC 3.78 (L) 11/24/2017 1633   HGB 12.1 (L) 11/24/2017 1633   HCT 37.6 (L) 11/24/2017 1633   PLT 136.0 (L) 11/24/2017 1633   MCV 99.4 11/24/2017 1633   MCH 30.5 02/04/2017 1229   MCHC 32.0 11/24/2017 1633   RDW 18.0 (H) 11/24/2017 1633   LYMPHSABS 1.3 07/28/2017 1628   MONOABS 0.5 07/28/2017 1628   EOSABS 0.1 07/28/2017 1628   BASOSABS 0.1 07/28/2017 1628   Iron/TIBC/Ferritin/ %Sat No results found for: IRON, TIBC, FERRITIN, IRONPCTSAT Lipid Panel     Component Value Date/Time   CHOL 119 11/24/2017 1633   TRIG 148.0 11/24/2017 1633   HDL 30.10 (L) 11/24/2017 1633   CHOLHDL 4 11/24/2017 1633   VLDL 29.6 11/24/2017 1633   LDLCALC 60 11/24/2017 1633   LDLDIRECT 85.0 07/28/2017 1628   Hepatic Function Panel     Component Value Date/Time   PROT 6.5 11/24/2017 1633   ALBUMIN 3.7 11/24/2017 1633   AST 21 11/24/2017 1633   ALT 18 11/24/2017 1633   ALKPHOS 76 11/24/2017 1633   BILITOT 0.3 11/24/2017 1633   BILIDIR 0.1 02/21/2015 0921   IBILI 0.4 04/19/2014 1418      Component Value Date/Time   TSH 1.71 11/24/2017 1633   TSH 1.30 07/28/2017 1628   TSH 1.18 01/25/2017 1415   Results for ADALBERT, ALBERTO (MRN [id number]) as of 03/16/2018 15:30  Ref. Range 12/06/2017 09:54  Vitamin D, 25-Hydroxy Latest Ref Range: 30.0 -  100.0 ng/mL 47.9   ASSESSMENT AND PLAN: Essential hypertension  Prediabetes  Class 3 severe obesity with serious comorbidity and body mass index (BMI) of 45.0 to 49.9 in adult, unspecified obesity type ([place])  PLAN:  Hypertension Lavelle is to notify us if dizzy or  lightheaded. We will continue to monitor his blood pressure as well as his progress with the above lifestyle modifications. He will continue his medications as prescribed and will watch for signs of hypotension as he continues his lifestyle modifications.  Emanuel will continue to work on weight loss, exercise, and decreasing simple carbohydrates in his diet to help decrease the risk of diabetes. We dicussed metformin including benefits and risks. Metformin was deferred today after physician discussed with patient possible side effects which included diarrhea- initially patient had expressed interest in metformin. He was informed that eating too many simple carbohydrates or too many calories at one sitting increases the likelihood of GI side effects. Dontreal will continue the category 3 plan and we will repeat labs at his next visit. Mylon agreed to follow up with [name] as directed to monitor his progress.  We spent > than 50% of the 15 minute visit on the counseling as documented in the note.  Obesity Kashif is currently in the action stage of change. As such, his goal is to continue with weight loss efforts He has agreed to follow the Category 3 [place] has been instructed to work up to a goal of 150 minutes of combined cardio and strengthening exercise per week for weight loss and overall health benefits. We discussed the following Behavioral Modification Strategies today: better snacking choices, planning for success, increasing lean protein intake, increasing vegetables and work on meal planning and easy cooking plans  Shareef has agreed to follow up with our clinic in 2 weeks. He was informed of the importance of frequent follow up visits to maximize his success with intensive lifestyle modifications for his multiple health conditions.   OBESITY BEHAVIORAL INTERVENTION VISIT  Today's visit was # 6 out of 22.  Starting weight: 385 lbs Starting date: 12/06/17 Today's weight : 372  lbs Today's date: 03/16/2018 Total lbs lost to date: 13 (Patients must lose 7 lbs in the first 6 months to continue with counseling)   ASK: We discussed the diagnosis of obesity with [name] today and Lowell agreed to give [name] permission to discuss obesity behavioral modification therapy today.  ASSESS: Jearl has the diagnosis of obesity and his BMI today is 47.74 Keshun is in the action stage of change   ADVISE: Armen was educated on the multiple health risks of obesity as well as the benefit of weight loss to improve his health. He was advised of the need for long term treatment and the importance of lifestyle modifications.  AGREE: Multiple dietary modification options and treatment options were discussed and  Jaydon agreed to the above obesity treatment plan.  I, Doreene Nest, am acting as transcriptionist for Eber Jones, MD  I have reviewed the above documentation for accuracy and completeness, and I agree with the above. - Ilene Qua, MD

## 2018-03-21 DIAGNOSIS — G4733 Obstructive sleep apnea (adult) (pediatric): Secondary | ICD-10-CM | POA: Diagnosis not present

## 2018-03-21 DIAGNOSIS — J449 Chronic obstructive pulmonary disease, unspecified: Secondary | ICD-10-CM | POA: Diagnosis not present

## 2018-03-21 DIAGNOSIS — R0602 Shortness of breath: Secondary | ICD-10-CM | POA: Diagnosis not present

## 2018-03-21 DIAGNOSIS — J9622 Acute and chronic respiratory failure with hypercapnia: Secondary | ICD-10-CM | POA: Diagnosis not present

## 2018-03-22 ENCOUNTER — Other Ambulatory Visit: Payer: Self-pay

## 2018-03-22 MED ORDER — FERROUS FUMARATE-FOLIC ACID 324-1 MG PO TABS: 1.0000 | ORAL_TABLET | Freq: Every day | ORAL | 3 refills | Status: DC

## 2018-03-28 ENCOUNTER — Ambulatory Visit (INDEPENDENT_AMBULATORY_CARE_PROVIDER_SITE_OTHER): Payer: PPO | Admitting: Family Medicine

## 2018-03-28 VITALS — BP 132/68 | HR 65 | Temp 98.0°F | Resp 18 | Ht 74.0 in | Wt 372.0 lb

## 2018-03-28 DIAGNOSIS — G4733 Obstructive sleep apnea (adult) (pediatric): Secondary | ICD-10-CM | POA: Diagnosis not present

## 2018-03-28 DIAGNOSIS — M1710 Unilateral primary osteoarthritis, unspecified knee: Secondary | ICD-10-CM

## 2018-03-28 DIAGNOSIS — R739 Hyperglycemia, unspecified: Secondary | ICD-10-CM

## 2018-03-28 DIAGNOSIS — E7800 Pure hypercholesterolemia, unspecified: Secondary | ICD-10-CM

## 2018-03-28 DIAGNOSIS — L97309 Non-pressure chronic ulcer of unspecified ankle with unspecified severity: Secondary | ICD-10-CM

## 2018-03-28 DIAGNOSIS — I350 Nonrheumatic aortic (valve) stenosis: Secondary | ICD-10-CM | POA: Diagnosis not present

## 2018-03-28 DIAGNOSIS — I872 Venous insufficiency (chronic) (peripheral): Secondary | ICD-10-CM

## 2018-03-28 DIAGNOSIS — I83003 Varicose veins of unspecified lower extremity with ulcer of ankle: Secondary | ICD-10-CM

## 2018-03-28 DIAGNOSIS — M1A9XX0 Chronic gout, unspecified, without tophus (tophi): Secondary | ICD-10-CM | POA: Diagnosis not present

## 2018-03-28 DIAGNOSIS — Z23 Encounter for immunization: Secondary | ICD-10-CM

## 2018-03-28 DIAGNOSIS — I10 Essential (primary) hypertension: Secondary | ICD-10-CM

## 2018-03-28 DIAGNOSIS — Z0000 Encounter for general adult medical examination without abnormal findings: Secondary | ICD-10-CM

## 2018-03-28 DIAGNOSIS — M179 Osteoarthritis of knee, unspecified: Secondary | ICD-10-CM

## 2018-03-28 NOTE — Progress Notes (Signed)
Subjective:  I acted as a Education administrator for Dr. Charlett Blake. [place], [place]  Patient ID: [name], male    DOB: 03-31-1941, 77 y.o.   MRN: [id number]  Chief Complaint  Patient presents with  . Annual Exam    HPI  Patient is in today for an annual preventative exam and follow up on chronic medical concerns including obesity, hyperglycemia, hyperlipidemia, sleep apnea and hypertension. He is accompanied by his wife. They report he has not had any recent febrile illness or hospitalizations. He has been working with healthy weight and wellness and has lost slow, steady weight. He does acknowledge improved ambulation ability and improved SOB and fatigue. Does well with his activities of daily living with his wife's support. No polyuria or polydipsia. Denies CP/palp/HA/congestion/fevers/GI or GU c/o. Taking meds as prescribed  Patient Care Team: [name], MD as PCP - General (Family Medicine) Josue Hector, MD as Consulting Physician (Cardiology)   Past Medical History:  Diagnosis Date  . Anemia   . Aortic stenosis   . Aortic stenosis, severe    S/p Edwards Sapien 3 Transcatheter Heart Valve (size 26 mm, model # [id number], serial # [id number])  . Arthritis   . Back pain   . Bursitis   . Cataract    left immature  . Cellulitis 10/12/2015  . Complication of anesthesia    Halucinations  . Constipation    takes Miralax daily as well as Senokot daily  . DDD (degenerative disc disease)   . Gout    takes Allopurinol daily  . Heart valve disorder   . History of blood clots 1962   knee  . History of blood transfusion    no abnormal reaction noted  . History of shingles   . Hyperlipidemia    takes Atorvastatin daily  . Hypertension    takes Lisinopril daily  . Joint pain   . Low back pain 01/25/2017  . Neck pain on left side 01/25/2017  . OSA (obstructive sleep apnea)   . Osteoarthritis   . Peripheral edema    takes Furosemide daily  . Peripheral neuropathy    takes Gabapentin daily    . Peroneal palsy    significant right foot drop  . Pneumonia 25+yrs ago   hx of  . Shortness of breath   . Swelling of extremity   . Urinary frequency   . Urinary urgency   . Valvular heart disease     Past Surgical History:  Procedure Laterality Date  . CARDIAC CATHETERIZATION N/A 05/02/2015   Procedure: Right/Left Heart Cath and Coronary Angiography;  Surgeon: Burnell Blanks, MD;  Location: [facility];  Service: Cardiovascular;  Laterality: N/A;  . COLONOSCOPY N/A 02/07/2013   Procedure: COLONOSCOPY;  Surgeon: Juanita Craver, MD;  Location: [facility] ENDOSCOPY;  Service: Endoscopy;  Laterality: N/A;  . COLONOSCOPY N/A 02/09/2013   Procedure: COLONOSCOPY;  Surgeon: Beryle Beams, MD;  Location: [place];  Service: Endoscopy;  Laterality: N/A;  . ESOPHAGOGASTRODUODENOSCOPY N/A 02/07/2013   Procedure: ESOPHAGOGASTRODUODENOSCOPY (EGD);  Surgeon: Juanita Craver, MD;  Location: [facility] ENDOSCOPY;  Service: Endoscopy;  Laterality: N/A;  . GIVENS CAPSULE STUDY N/A 02/09/2013   Procedure: GIVENS CAPSULE STUDY;  Surgeon: Beryle Beams, MD;  Location: [place];  Service: Endoscopy;  Laterality: N/A;  . HERNIA REPAIR     umbilical hernia  . KNEE SURGERY Right    multiple knee surgeries due to complication of R TKA  . LAMINOTOMY  1193  c6-t2  . SPINE SURGERY    . TEE WITHOUT CARDIOVERSION N/A 03/07/2015   Procedure: TRANSESOPHAGEAL ECHOCARDIOGRAM (TEE);  Surgeon: Josue Hector, MD;  Location: [facility] ENDOSCOPY;  Service: Cardiovascular;  Laterality: N/A;  ANES TO BRING PROPOFOL PER DOCTOR  . TEE WITHOUT CARDIOVERSION N/A 06/10/2015   Procedure: TRANSESOPHAGEAL ECHOCARDIOGRAM (TEE);  Surgeon: Burnell Blanks, MD;  Location: [place];  Service: Open Heart Surgery;  Laterality: N/A;  . TOTAL KNEE ARTHROPLASTY     right  . TRANSCATHETER AORTIC VALVE REPLACEMENT, TRANSFEMORAL Left 06/10/2015   Procedure: TRANSCATHETER AORTIC VALVE REPLACEMENT, TRANSFEMORAL;  Surgeon: Burnell Blanks,  MD;  Location: [place];  Service: Open Heart Surgery;  Laterality: Left;    Family History  Problem Relation Age of Onset  . Arthritis Sister        "crippling"  . Arthritis Sister     Social History   Socioeconomic History  . Marital status: Married    Spouse name: [name]  . Number of children: 3  . Years of education: Not on file  . Highest education level: Not on file  Occupational History  . Occupation: retired Investment banker, operational  . Financial resource strain: Not on file  . Food insecurity:    Worry: Not on file    Inability: Not on file  . Transportation needs:    Medical: Not on file    Non-medical: Not on file  Tobacco Use  . Smoking status: Former Smoker    Years: 30.00    Types: Pipe    Last attempt to quit: 09/20/2006    Years since quitting: 11.5  . Smokeless tobacco: Never Used  Substance and Sexual Activity  . Alcohol use: No    Alcohol/week: 0.0 oz  . Drug use: No  . Sexual activity: Never    Comment: lives with wife, retired, no dietary restrictions  Lifestyle  . Physical activity:    Days per week: Not on file    Minutes per session: Not on file  . Stress: Not on file  Relationships  . Social connections:    Talks on phone: Not on file    Gets together: Not on file    Attends religious service: Not on file    Active member of club or organization: Not on file    Attends meetings of clubs or organizations: Not on file    Relationship status: Not on file  . Intimate partner violence:    Fear of current or ex partner: Not on file    Emotionally abused: Not on file    Physically abused: Not on file    Forced sexual activity: Not on file  Other Topics Concern  . Not on file  Social History Narrative  . Not on file    Outpatient Medications Prior to Visit  Medication Sig Dispense Refill  . acetaminophen (TYLENOL) 500 MG tablet Take 500 mg by mouth 2 (two) times daily.     [name] allopurinol (ZYLOPRIM) 300 MG tablet Take 1 tablet (300 mg  total) by mouth daily. 90 tablet 1  . aspirin 81 MG EC tablet TAKE 1 TABLET (81 MG TOTAL) BY MOUTH DAILY. 30 tablet 11  . atorvastatin (LIPITOR) 40 MG tablet Take 1 and a half tablet daily by mouth  (60mg ) 135 tablet 0  . cholecalciferol (VITAMIN D) 1000 UNITS tablet Take 2,000 Units by mouth daily.     . clopidogrel (PLAVIX) 75 MG tablet TAKE 1 TABLET (75 MG TOTAL) BY  MOUTH DAILY WITH BREAKFAST. 90 tablet 3  . Coenzyme Q10 (COQ10) 200 MG CAPS Take 200 mg by mouth daily.    . furosemide (LASIX) 20 MG tablet TAKE 1 TABLET BY MOUTH TWICE A DAY 180 tablet 2  . gabapentin (NEURONTIN) 300 MG capsule Take 1 capsule (300 mg total) by mouth 3 (three) times daily. 270 capsule 1  . lisinopril (PRINIVIL,ZESTRIL) 10 MG tablet Take 1 tablet (10 mg total) by mouth daily. 90 tablet 1  . Misc Natural Products (TART CHERRY ADVANCED PO) Take 1 capsule by mouth daily.    . Multiple Vitamin (MULTIVITAMIN WITH MINERALS) TABS Take 1 tablet by mouth daily.    . polyethylene glycol (MIRALAX / GLYCOLAX) packet Take 17 g by mouth daily.    . sennosides-docusate sodium (SENOKOT-S) 8.6-50 MG tablet Take 1 tablet by mouth 2 (two) times daily.    . Ferrous Fumarate-Folic Acid (HEMATINIC/FOLIC ACID) 258-5 MG TABS Take 1 tablet by mouth daily. 30 each 3   No facility-administered medications prior to visit.     Allergies  Allergen Reactions  . Coumadin [Warfarin Sodium] Other (See Comments)    States he can't be on this-bleeds out    Review of Systems  Constitutional: Positive for malaise/fatigue. Negative for chills and fever.  HENT: Negative for congestion and hearing loss.   Eyes: Negative for discharge.  Respiratory: Positive for shortness of breath. Negative for cough and sputum production.   Cardiovascular: Negative for chest pain, palpitations and leg swelling.  Gastrointestinal: Negative for abdominal pain, blood in stool, constipation, diarrhea, heartburn, nausea and vomiting.  Genitourinary: Negative for  dysuria, frequency, hematuria and urgency.  Musculoskeletal: Positive for joint pain. Negative for back pain, falls and myalgias.  Skin: Negative for rash.  Neurological: Negative for dizziness, sensory change, loss of consciousness, weakness and headaches.  Endo/Heme/Allergies: Negative for environmental allergies. Does not bruise/bleed easily.  Psychiatric/Behavioral: Negative for depression and suicidal ideas. The patient is not nervous/anxious and does not have insomnia.        Objective:    Physical Exam  Constitutional: He is oriented to person, place, and time. He appears well-developed and well-nourished. No distress.  HENT:  Head: Normocephalic and atraumatic.  Eyes: Conjunctivae are normal.  Neck: Neck supple. No thyromegaly present.  Cardiovascular: Normal rate and regular rhythm.  Murmur heard. Pulmonary/Chest: Effort normal and breath sounds normal. No respiratory distress. He has no wheezes.  Abdominal: Soft. Bowel sounds are normal. He exhibits no mass. There is no tenderness.  Musculoskeletal: He exhibits no edema.  Chronic venous stasis changes b/l lower extremities with thickened, dark skin. Skin breakdown at medial malleolus b/l  Lymphadenopathy:    He has no cervical adenopathy.  Neurological: He is alert and oriented to person, place, and time.  Skin: Skin is warm and dry.  Psychiatric: He has a normal mood and affect. His behavior is normal.    BP 132/68 (BP Location: Left Arm, Patient Position: Sitting, Cuff Size: Large)   Pulse 65   Temp 98 F (36.7 C) (Oral)   Resp 18   Ht 6\' 2"  (1.88 m)   Wt (!) 372 lb (168.7 kg)   SpO2 97%   BMI 47.76 kg/m  Wt Readings from Last 3 Encounters:  03/28/18 (!) 372 lb (168.7 kg)  03/16/18 (!) 372 lb (168.7 kg)  02/08/18 (!) 375 lb (170.1 kg)   BP Readings from Last 3 Encounters:  03/28/18 132/68  03/16/18 95/63  02/08/18 111/71     Immunization  History  Administered Date(s) Administered  . Influenza Split  07/08/2011, 06/20/2012  . Influenza, High Dose Seasonal PF 06/22/2016, 07/06/2017  . Influenza,inj,Quad PF,6+ Mos 06/07/2013, 07/29/2014, 07/18/2015  . Influenza-Unspecified 06/26/2017  . PPD Test 02/16/2013  . Pneumococcal Conjugate-13 10/11/2013  . Pneumococcal Polysaccharide-23 11/07/2015  . Td 08/26/2006, 03/28/2018  . Zoster 07/27/2013    Health Maintenance  Topic Date Due  . INFLUENZA VACCINE  04/20/2018  . COLONOSCOPY  02/10/2023  . TETANUS/TDAP  03/28/2028  . PNA vac Low Risk Adult  Completed    Lab Results  Component Value Date   WBC 6.7 11/24/2017   HGB 12.1 (L) 11/24/2017   HCT 37.6 (L) 11/24/2017   PLT 136.0 (L) 11/24/2017   GLUCOSE 106 (H) 11/24/2017   CHOL 119 11/24/2017   TRIG 148.0 11/24/2017   HDL 30.10 (L) 11/24/2017   LDLDIRECT 85.0 07/28/2017   LDLCALC 60 11/24/2017   ALT 18 11/24/2017   AST 21 11/24/2017   NA 140 11/24/2017   K 5.1 11/24/2017   CL 100 11/24/2017   CREATININE 1.36 11/24/2017   BUN 32 (H) 11/24/2017   CO2 34 (H) 11/24/2017   TSH 1.71 11/24/2017   INR 1.32 06/10/2015   HGBA1C 5.5 12/06/2017    Lab Results  Component Value Date   TSH 1.71 11/24/2017   Lab Results  Component Value Date   WBC 6.7 11/24/2017   HGB 12.1 (L) 11/24/2017   HCT 37.6 (L) 11/24/2017   MCV 99.4 11/24/2017   PLT 136.0 (L) 11/24/2017   Lab Results  Component Value Date   NA 140 11/24/2017   K 5.1 11/24/2017   CO2 34 (H) 11/24/2017   GLUCOSE 106 (H) 11/24/2017   BUN 32 (H) 11/24/2017   CREATININE 1.36 11/24/2017   BILITOT 0.3 11/24/2017   ALKPHOS 76 11/24/2017   AST 21 11/24/2017   ALT 18 11/24/2017   PROT 6.5 11/24/2017   ALBUMIN 3.7 11/24/2017   CALCIUM 9.8 11/24/2017   ANIONGAP 6 02/04/2017   GFR 54.25 (L) 11/24/2017   Lab Results  Component Value Date   CHOL 119 11/24/2017   Lab Results  Component Value Date   HDL 30.10 (L) 11/24/2017   Lab Results  Component Value Date   LDLCALC 60 11/24/2017   Lab Results  Component Value  Date   TRIG 148.0 11/24/2017   Lab Results  Component Value Date   CHOLHDL 4 11/24/2017   Lab Results  Component Value Date   HGBA1C 5.5 12/06/2017         Assessment & Plan:   Problem List Items Addressed This Visit    HYPERCHOLESTEROLEMIA    Tolerating statin, encouraged heart healthy diet, avoid trans fats, minimize simple carbs and saturated fats. Increase exercise as tolerated      Gout    No recent flares. Maintain hydration and avoid offending foods.       OBESITY, MORBID    Has been working with healthy weight and wellness and has slow steady weight loss      Obstructive sleep apnea    Using CPAP and following with pulmonology      Osteoarthritis    Persistent knee pain and debility. Has been using a Breg brace for years and it is nearly worn out. Will refer for specialty care to get new brace fitted. This keeps him able to ambulate with some stability       Venous stasis dermatitis    Has skin break down at medial malleolus  as he has lost weight and the skin is less tight we will refer to wound management to see if they are able to help him improve his wounds.       Preventative health care    Patient encouraged to maintain heart healthy diet, regular exercise, adequate sleep. Consider daily probiotics. Take medications as prescribed      Severe aortic valve stenosis    S/p TAVR      Hyperglycemia    hgba1c acceptable, minimize simple carbs. Increase exercise as tolerated.       Essential hypertension    Well controlled, no changes to meds. Encouraged heart healthy diet such as the DASH diet and exercise as tolerated.        Other Visit Diagnoses    Venous stasis ulcer of ankle, unspecified laterality, unspecified ulcer stage, unspecified whether varicose veins present ([place])    -  Primary   Relevant Orders   Ambulatory referral to Wound Clinic   Need for Td vaccine       Relevant Orders   Td vaccine preservative free greater than or equal to  7yo IM (Completed)      I have discontinued Aahil D. Dittmer's Ferrous Fumarate-Folic Acid. I am also having him maintain his cholecalciferol, sennosides-docusate sodium, multivitamin with minerals, acetaminophen, CoQ10, polyethylene glycol, aspirin, clopidogrel, furosemide, lisinopril, allopurinol, atorvastatin, Misc Natural Products (TART CHERRY ADVANCED PO), and gabapentin.  No orders of the defined types were placed in this encounter.   CMA served as Education administrator during this visit. History, Physical and Plan performed by medical provider. Documentation and orders reviewed and attested to.  Penni Homans, MD

## 2018-03-28 NOTE — Patient Instructions (Signed)
Preventive Care 76 Years and Older, Male Preventive care refers to lifestyle choices and visits with your health care provider that can promote health and wellness. What does preventive care include?  A yearly physical exam. This is also called an annual well check.  Dental exams once or twice a year.  Routine eye exams. Ask your health care provider how often you should have your eyes checked.  Personal lifestyle choices, including: ? Daily care of your teeth and gums. ? Regular physical activity. ? Eating a healthy diet. ? Avoiding tobacco and drug use. ? Limiting alcohol use. ? Practicing safe sex. ? Taking low doses of aspirin every day. ? Taking vitamin and mineral supplements as recommended by your health care provider. What happens during an annual well check? The services and screenings done by your health care provider during your annual well check will depend on your age, overall health, lifestyle risk factors, and family history of disease. Counseling Your health care provider may ask you questions about your:  Alcohol use.  Tobacco use.  Drug use.  Emotional well-being.  Home and relationship well-being.  Sexual activity.  Eating habits.  History of falls.  Memory and ability to understand (cognition).  Work and work environment.  Screening You may have the following tests or measurements:  Height, weight, and BMI.  Blood pressure.  Lipid and cholesterol levels. These may be checked every 5 years, or more frequently if you are over 50 years old.  Skin check.  Lung cancer screening. You may have this screening every year starting at age 55 if you have a 30-pack-year history of smoking and currently smoke or have quit within the past 15 years.  Fecal occult blood test (FOBT) of the stool. You may have this test every year starting at age 50.  Flexible sigmoidoscopy or colonoscopy. You may have a sigmoidoscopy every 5 years or a colonoscopy every 10  years starting at age 50.  Prostate cancer screening. Recommendations will vary depending on your family history and other risks.  Hepatitis C blood test.  Hepatitis B blood test.  Sexually transmitted disease (STD) testing.  Diabetes screening. This is done by checking your blood sugar (glucose) after you have not eaten for a while (fasting). You may have this done every 1-3 years.  Abdominal aortic aneurysm (AAA) screening. You may need this if you are a current or former smoker.  Osteoporosis. You may be screened starting at age 70 if you are at high risk.  Talk with your health care provider about your test results, treatment options, and if necessary, the need for more tests. Vaccines Your health care provider may recommend certain vaccines, such as:  Influenza vaccine. This is recommended every year.  Tetanus, diphtheria, and acellular pertussis (Tdap, Td) vaccine. You may need a Td booster every 10 years.  Varicella vaccine. You may need this if you have not been vaccinated.  Zoster vaccine. You may need this after age 60.  Measles, mumps, and rubella (MMR) vaccine. You may need at least one dose of MMR if you were born in 1957 or later. You may also need a second dose.  Pneumococcal 13-valent conjugate (PCV13) vaccine. One dose is recommended after age 65.  Pneumococcal polysaccharide (PPSV23) vaccine. One dose is recommended after age 65.  Meningococcal vaccine. You may need this if you have certain conditions.  Hepatitis A vaccine. You may need this if you have certain conditions or if you travel or work in places where you   may be exposed to hepatitis A.  Hepatitis B vaccine. You may need this if you have certain conditions or if you travel or work in places where you may be exposed to hepatitis B.  Haemophilus influenzae type b (Hib) vaccine. You may need this if you have certain risk factors.  Talk to your health care provider about which screenings and vaccines  you need and how often you need them. This information is not intended to replace advice given to you by your health care provider. Make sure you discuss any questions you have with your health care provider. Document Released: 10/03/2015 Document Revised: 05/26/2016 Document Reviewed: 07/08/2015 Elsevier Interactive Patient Education  2018 [employer] Inc[employer]  

## 2018-04-01 NOTE — Assessment & Plan Note (Signed)
Using CPAP and following with pulmonology

## 2018-04-01 NOTE — Assessment & Plan Note (Addendum)
Has been working with healthy weight and wellness and has slow steady weight loss

## 2018-04-02 NOTE — Assessment & Plan Note (Signed)
hgba1c acceptable, minimize simple carbs. Increase exercise as tolerated.  

## 2018-04-02 NOTE — Assessment & Plan Note (Signed)
No recent flares. Maintain hydration and avoid offending foods.

## 2018-04-02 NOTE — Assessment & Plan Note (Signed)
Tolerating statin, encouraged heart healthy diet, avoid trans fats, minimize simple carbs and saturated fats. Increase exercise as tolerated 

## 2018-04-02 NOTE — Assessment & Plan Note (Signed)
Persistent knee pain and debility. Has been using a Breg brace for years and it is nearly worn out. Will refer for specialty care to get new brace fitted. This keeps him able to ambulate with some stability

## 2018-04-02 NOTE — Assessment & Plan Note (Signed)
S/p TAVR 

## 2018-04-02 NOTE — Assessment & Plan Note (Signed)
Has skin break down at medial malleolus as he has lost weight and the skin is less tight we will refer to wound management to see if they are able to help him improve his wounds.

## 2018-04-02 NOTE — Assessment & Plan Note (Signed)
Patient encouraged to maintain heart healthy diet, regular exercise, adequate sleep. Consider daily probiotics. Take medications as prescribed 

## 2018-04-02 NOTE — Assessment & Plan Note (Signed)
Well controlled, no changes to meds. Encouraged heart healthy diet such as the DASH diet and exercise as tolerated.  °

## 2018-04-10 ENCOUNTER — Other Ambulatory Visit: Payer: Self-pay | Admitting: Cardiovascular Disease

## 2018-04-11 ENCOUNTER — Encounter (INDEPENDENT_AMBULATORY_CARE_PROVIDER_SITE_OTHER): Payer: Self-pay | Admitting: Family Medicine

## 2018-04-11 ENCOUNTER — Ambulatory Visit (INDEPENDENT_AMBULATORY_CARE_PROVIDER_SITE_OTHER): Payer: PPO | Admitting: Family Medicine

## 2018-04-11 VITALS — BP 104/64 | HR 81 | Temp 98.4°F | Ht 74.0 in | Wt 374.0 lb

## 2018-04-11 DIAGNOSIS — R7303 Prediabetes: Secondary | ICD-10-CM | POA: Diagnosis not present

## 2018-04-11 DIAGNOSIS — I10 Essential (primary) hypertension: Secondary | ICD-10-CM

## 2018-04-11 DIAGNOSIS — Z6841 Body Mass Index (BMI) 40.0 and over, adult: Secondary | ICD-10-CM | POA: Diagnosis not present

## 2018-04-11 DIAGNOSIS — Z9189 Other specified personal risk factors, not elsewhere classified: Secondary | ICD-10-CM | POA: Diagnosis not present

## 2018-04-11 NOTE — Progress Notes (Signed)
Office: [phone]  /  Fax: [phone]   HPI:   Chief Complaint: [name] is here to discuss his progress with his obesity treatment plan. He is on the Category 3 plan and is following his eating plan approximately 80 % of the time. He states he is exercising 0 minutes 0 times per week. [name] the past 2 weeks and may have over indulged in [name]. Returned 3 days ago and has been staying in and recuperating.  His weight is (!) 374 lb (169.6 kg) today and has gained 2 pounds since his last visit. He has lost 11 lbs since starting treatment with [name].  Hypertension [name] is a 77 y.o. male with hypertension. [name]'s blood pressure is slightly low today. He denies dizziness or lightheadedness. He is working weight loss to help control his blood pressure with the goal of decreasing his risk of heart attack and stroke. [name] blood pressure is not currently controlled.  [name] has a diagnosis of pre-diabetes based on his elevated Hgb A1c and was informed this puts him at greater risk of developing diabetes. Last Hgb A1c was checked on 12/06/17. He is not taking metformin currently and continues to work on diet and exercise to decrease risk of diabetes. He denies nausea or hypoglycemia.  ALLERGIES: Allergies  Allergen Reactions  . Coumadin [Warfarin Sodium] Other (See Comments)    States he can't be on this-bleeds out    MEDICATIONS: Current Outpatient Medications on File Prior to Visit  Medication Sig Dispense Refill  . acetaminophen (TYLENOL) 500 MG tablet Take 500 mg by mouth 2 (two) times daily.     [name] Kitchen allopurinol (ZYLOPRIM) 300 MG tablet Take 1 tablet (300 mg total) by mouth daily. 90 tablet 1  . aspirin 81 MG EC tablet TAKE 1 TABLET (81 MG TOTAL) BY MOUTH DAILY. 30 tablet 11  . atorvastatin (LIPITOR) 40 MG tablet Take 1 and a half tablet daily by mouth  (60mg ) 135 tablet 0  . cholecalciferol (VITAMIN D) 1000 UNITS  tablet Take 2,000 Units by mouth daily.     . clopidogrel (PLAVIX) 75 MG tablet TAKE 1 TABLET (75 MG TOTAL) BY MOUTH DAILY WITH BREAKFAST. 90 tablet 0  . Coenzyme Q10 (COQ10) 200 MG CAPS Take 200 mg by mouth daily.    . furosemide (LASIX) 20 MG tablet TAKE 1 TABLET BY MOUTH TWICE A DAY 180 tablet 2  . gabapentin (NEURONTIN) 300 MG capsule Take 1 capsule (300 mg total) by mouth 3 (three) times daily. 270 capsule 1  . lisinopril (PRINIVIL,ZESTRIL) 10 MG tablet Take 1 tablet (10 mg total) by mouth daily. (Patient taking differently: Take 5 mg by mouth daily. ) 90 tablet 1  . Misc Natural Products (TART CHERRY ADVANCED PO) Take 1 capsule by mouth daily.    . Multiple Vitamin (MULTIVITAMIN WITH MINERALS) TABS Take 1 tablet by mouth daily.    . polyethylene glycol (MIRALAX / GLYCOLAX) packet Take 17 g by mouth daily.    . sennosides-docusate sodium (SENOKOT-S) 8.6-50 MG tablet Take 1 tablet by mouth 2 (two) times daily.     No current facility-administered medications on file prior to visit.     PAST MEDICAL HISTORY: Past Medical History:  Diagnosis Date  . Anemia   . Aortic stenosis   . Aortic stenosis, severe    S/p Edwards Sapien 3 Transcatheter Heart Valve (size 26 mm, model # [id number], serial # [id number])  . Arthritis   .  Back pain   . Bursitis   . Cataract    left immature  . Cellulitis 10/12/2015  . Complication of anesthesia    Halucinations  . Constipation    takes Miralax daily as well as Senokot daily  . DDD (degenerative disc disease)   . Gout    takes Allopurinol daily  . Heart valve disorder   . History of blood clots 1962   knee  . History of blood transfusion    no abnormal reaction noted  . History of shingles   . Hyperlipidemia    takes Atorvastatin daily  . Hypertension    takes Lisinopril daily  . Joint pain   . Low back pain 01/25/2017  . Neck pain on left side 01/25/2017  . OSA (obstructive sleep apnea)   . Osteoarthritis   . Peripheral edema    takes  Furosemide daily  . Peripheral neuropathy    takes Gabapentin daily  . Peroneal palsy    significant right foot drop  . Pneumonia 25+yrs ago   hx of  . Shortness of breath   . Swelling of extremity   . Urinary frequency   . Urinary urgency   . Valvular heart disease     PAST SURGICAL HISTORY: Past Surgical History:  Procedure Laterality Date  . CARDIAC CATHETERIZATION N/A 05/02/2015   Procedure: Right/Left Heart Cath and Coronary Angiography;  Surgeon: Burnell Blanks, MD;  Location: [place] [facility];  Service: Cardiovascular;  Laterality: N/A;  . COLONOSCOPY N/A 02/07/2013   Procedure: COLONOSCOPY;  Surgeon: Juanita Craver, MD;  Location: [facility] ENDOSCOPY;  Service: Endoscopy;  Laterality: N/A;  . COLONOSCOPY N/A 02/09/2013   Procedure: COLONOSCOPY;  Surgeon: Beryle Beams, MD;  Location: [place];  Service: Endoscopy;  Laterality: N/A;  . ESOPHAGOGASTRODUODENOSCOPY N/A 02/07/2013   Procedure: ESOPHAGOGASTRODUODENOSCOPY (EGD);  Surgeon: Juanita Craver, MD;  Location: [facility] ENDOSCOPY;  Service: Endoscopy;  Laterality: N/A;  . GIVENS CAPSULE STUDY N/A 02/09/2013   Procedure: GIVENS CAPSULE STUDY;  Surgeon: Beryle Beams, MD;  Location: [place];  Service: Endoscopy;  Laterality: N/A;  . HERNIA REPAIR     umbilical hernia  . KNEE SURGERY Right    multiple knee surgeries due to complication of R TKA  . LAMINOTOMY  1610   c6-t2  . SPINE SURGERY    . TEE WITHOUT CARDIOVERSION N/A 03/07/2015   Procedure: TRANSESOPHAGEAL ECHOCARDIOGRAM (TEE);  Surgeon: Josue Hector, MD;  Location: [facility] ENDOSCOPY;  Service: Cardiovascular;  Laterality: N/A;  ANES TO BRING PROPOFOL PER DOCTOR  . TEE WITHOUT CARDIOVERSION N/A 06/10/2015   Procedure: TRANSESOPHAGEAL ECHOCARDIOGRAM (TEE);  Surgeon: Burnell Blanks, MD;  Location: [place];  Service: Open Heart Surgery;  Laterality: N/A;  . TOTAL KNEE ARTHROPLASTY     right  . TRANSCATHETER AORTIC VALVE REPLACEMENT, TRANSFEMORAL Left 06/10/2015    Procedure: TRANSCATHETER AORTIC VALVE REPLACEMENT, TRANSFEMORAL;  Surgeon: Burnell Blanks, MD;  Location: [place];  Service: Open Heart Surgery;  Laterality: Left;    SOCIAL HISTORY: Social History   Tobacco Use  . Smoking status: Former Smoker    Years: 30.00    Types: Pipe    Last attempt to quit: 09/20/2006    Years since quitting: 11.5  . Smokeless tobacco: Never Used  Substance Use Topics  . Alcohol use: No    Alcohol/week: 0.0 oz  . Drug use: No    FAMILY HISTORY: Family History  Problem Relation Age of Onset  . Arthritis Sister        "  crippling"  . Arthritis Sister     ROS: Review of Systems  Constitutional: Negative for weight loss.  Gastrointestinal: Negative for nausea.  Neurological: Negative for dizziness.       Negative lightheadedness  Endo/Heme/Allergies:       Negative hypoglycemia    PHYSICAL EXAM: Blood pressure 104/64, pulse 81, temperature 98.4 F (36.9 C), temperature source Oral, height 6\' 2"  (1.88 m), weight (!) 374 lb (169.6 kg), SpO2 92 %. Body mass index is 48.02 kg/m. Physical Exam  Constitutional: He is oriented to person, place, and time. He appears well-developed and well-nourished.  Cardiovascular: Normal rate.  Pulmonary/Chest: Effort normal.  Musculoskeletal: Normal range of motion.  Neurological: He is oriented to person, place, and time.  Skin: Skin is warm and dry.  Psychiatric: He has a normal mood and affect. His behavior is normal.  Vitals reviewed.   RECENT LABS AND TESTS: BMET    Component Value Date/Time   NA 140 11/24/2017 1633   K 5.1 11/24/2017 1633   CL 100 11/24/2017 1633   CO2 34 (H) 11/24/2017 1633   GLUCOSE 106 (H) 11/24/2017 1633   BUN 32 (H) 11/24/2017 1633   CREATININE 1.36 11/24/2017 1633   CREATININE 1.19 02/28/2015 1548   CALCIUM 9.8 11/24/2017 1633   GFRNONAA 40 (L) 02/04/2017 1229   GFRAA 46 (L) 02/04/2017 1229   Lab Results  Component Value Date   HGBA1C 5.5 12/06/2017   HGBA1C 5.8  11/24/2017   HGBA1C 6.0 07/28/2017   HGBA1C 5.7 (H) 06/06/2015   Lab Results  Component Value Date   INSULIN 21.5 12/06/2017   CBC    Component Value Date/Time   WBC 6.7 11/24/2017 1633   RBC 3.78 (L) 11/24/2017 1633   HGB 12.1 (L) 11/24/2017 1633   HCT 37.6 (L) 11/24/2017 1633   PLT 136.0 (L) 11/24/2017 1633   MCV 99.4 11/24/2017 1633   MCH 30.5 02/04/2017 1229   MCHC 32.0 11/24/2017 1633   RDW 18.0 (H) 11/24/2017 1633   LYMPHSABS 1.3 07/28/2017 1628   MONOABS 0.5 07/28/2017 1628   EOSABS 0.1 07/28/2017 1628   BASOSABS 0.1 07/28/2017 1628   Iron/TIBC/Ferritin/ %Sat No results found for: IRON, TIBC, FERRITIN, IRONPCTSAT Lipid Panel     Component Value Date/Time   CHOL 119 11/24/2017 1633   TRIG 148.0 11/24/2017 1633   HDL 30.10 (L) 11/24/2017 1633   CHOLHDL 4 11/24/2017 1633   VLDL 29.6 11/24/2017 1633   LDLCALC 60 11/24/2017 1633   LDLDIRECT 85.0 07/28/2017 1628   Hepatic Function Panel     Component Value Date/Time   PROT 6.5 11/24/2017 1633   ALBUMIN 3.7 11/24/2017 1633   AST 21 11/24/2017 1633   ALT 18 11/24/2017 1633   ALKPHOS 76 11/24/2017 1633   BILITOT 0.3 11/24/2017 1633   BILIDIR 0.1 02/21/2015 0921   IBILI 0.4 04/19/2014 1418      Component Value Date/Time   TSH 1.71 11/24/2017 1633   TSH 1.30 07/28/2017 1628   TSH 1.18 01/25/2017 1415    ASSESSMENT AND PLAN: Essential hypertension  Prediabetes - Plan: Comprehensive metabolic panel, Hemoglobin A1c, Insulin, random, Lipid Panel With LDL/HDL Ratio  Class 3 severe obesity with serious comorbidity and body mass index (BMI) of 45.0 to 49.9 in adult, unspecified obesity type ([place])  PLAN:  Hypertension We discussed sodium restriction, working on healthy weight loss, and a regular exercise program as the means to achieve improved blood pressure control. Tillman agreed with this plan and agreed to  follow up as directed. We will continue to monitor his blood pressure as well as his progress with the  above lifestyle modifications. Yandriel agrees to decrease lisinopril to 5 mg PO daily, and we will follow up on blood pressure at next visit. He will watch for signs of hypotension as he continues his lifestyle modifications. We will check labs today and Brice agrees to follow up with our clinic in 2 weeks.  [place] will continue to work on weight loss, exercise, and decreasing simple carbohydrates in his diet to help decrease the risk of diabetes. We dicussed metformin including benefits and risks. He was informed that eating too many simple carbohydrates or too many calories at one sitting increases the likelihood of GI side effects. Governor declined metformin for now and a prescription was not written today. We will check labs today and Reyan agrees to follow up with our clinic in 2 weeks as directed to monitor his progress.  Obesity Januel is currently in the action stage of change. As such, his goal is to continue with weight loss efforts He has agreed to follow the Category 3 [place] has been instructed to work up to a goal of 150 minutes of combined cardio and strengthening exercise per week for weight loss and overall health benefits. We discussed the following Behavioral Modification Strategies today: increasing lean protein intake, increasing vegetables, work on meal planning and easy cooking plans, better snacking choices, and planning for success Kyro tends to be a picky eater, not following plan secondary to favoring carbohydrates.  B[place]ton has agreed to follow up with our clinic in 2 weeks. He was informed of the importance of frequent follow up visits to maximize his success with intensive lifestyle modifications for his multiple health conditions.   OBESITY BEHAVIORAL INTERVENTION VISIT  Today's visit was # 7 out of 22.  Starting weight: 385 lbs Starting date: 12/06/17 Today's weight : 374 lbs  Today's date: 04/11/2018 Total lbs lost to date: 11    ASK: We discussed  the diagnosis of obesity with [name] today and Jerone agreed to give [name] permission to discuss obesity behavioral modification therapy today.  ASSESS: Bracen has the diagnosis of obesity and his BMI today is [place] is in the action stage of change   ADVISE: Taygen was educated on the multiple health risks of obesity as well as the benefit of weight loss to improve his health. He was advised of the need for long term treatment and the importance of lifestyle modifications.  AGREE: Multiple dietary modification options and treatment options were discussed and  Peter agreed to the above obesity treatment plan.  I, Trixie Dredge, am acting as transcriptionist for Ilene Qua, MD  I have reviewed the above documentation for accuracy and completeness, and I agree with the above. - Ilene Qua, MD

## 2018-04-12 ENCOUNTER — Encounter: Payer: Self-pay | Admitting: Family Medicine

## 2018-04-12 ENCOUNTER — Ambulatory Visit (INDEPENDENT_AMBULATORY_CARE_PROVIDER_SITE_OTHER): Payer: PPO | Admitting: Family Medicine

## 2018-04-12 VITALS — BP 102/72 | HR 82 | Temp 98.1°F | Ht 74.0 in | Wt 374.0 lb

## 2018-04-12 DIAGNOSIS — R300 Dysuria: Secondary | ICD-10-CM

## 2018-04-12 DIAGNOSIS — R350 Frequency of micturition: Secondary | ICD-10-CM

## 2018-04-12 LAB — LIPID PANEL WITH LDL/HDL RATIO
Cholesterol, Total: 110 mg/dL (ref 100–199)
HDL: 32 mg/dL — ABNORMAL LOW
LDL Calculated: 61 mg/dL (ref 0–99)
LDl/HDL Ratio: 1.9 ratio (ref 0.0–3.6)
Triglycerides: 87 mg/dL (ref 0–149)
VLDL Cholesterol Cal: 17 mg/dL (ref 5–40)

## 2018-04-12 LAB — POC URINALSYSI DIPSTICK (AUTOMATED)
Bilirubin, UA: NEGATIVE
Glucose, UA: NEGATIVE
Ketones, UA: NEGATIVE
Nitrite, UA: NEGATIVE
Protein, UA: POSITIVE — AB
Spec Grav, UA: 1.015 (ref 1.010–1.025)
Urobilinogen, UA: 0.2 E.U./dL
pH, UA: 6 (ref 5.0–8.0)

## 2018-04-12 LAB — COMPREHENSIVE METABOLIC PANEL WITH GFR
ALT: 20 [IU]/L (ref 0–44)
AST: 17 [IU]/L (ref 0–40)
Albumin/Globulin Ratio: 1.5 (ref 1.2–2.2)
Albumin: 3.5 g/dL (ref 3.5–4.8)
Alkaline Phosphatase: 82 [IU]/L (ref 39–117)
BUN/Creatinine Ratio: 32 — ABNORMAL HIGH (ref 10–24)
BUN: 66 mg/dL — ABNORMAL HIGH (ref 8–27)
Bilirubin Total: 0.4 mg/dL (ref 0.0–1.2)
CO2: 19 mmol/L — ABNORMAL LOW (ref 20–29)
Calcium: 8.7 mg/dL (ref 8.6–10.2)
Chloride: 99 mmol/L (ref 96–106)
Creatinine, Ser: 2.06 mg/dL — ABNORMAL HIGH (ref 0.76–1.27)
GFR calc Af Amer: 35 mL/min/{1.73_m2} — ABNORMAL LOW
GFR calc non Af Amer: 31 mL/min/{1.73_m2} — ABNORMAL LOW
Globulin, Total: 2.4 g/dL (ref 1.5–4.5)
Glucose: 100 mg/dL — ABNORMAL HIGH (ref 65–99)
Potassium: 5.2 mmol/L (ref 3.5–5.2)
Sodium: 138 mmol/L (ref 134–144)
Total Protein: 5.9 g/dL — ABNORMAL LOW (ref 6.0–8.5)

## 2018-04-12 LAB — HEMOGLOBIN A1C
Est. average glucose Bld gHb Est-mCnc: 111 mg/dL
Hgb A1c MFr Bld: 5.5 % (ref 4.8–5.6)

## 2018-04-12 LAB — INSULIN, RANDOM: INSULIN: 15.2 u[IU]/mL (ref 2.6–24.9)

## 2018-04-12 MED ORDER — CEPHALEXIN 500 MG PO CAPS
500.0000 mg | ORAL_CAPSULE | Freq: Two times a day (BID) | ORAL | 0 refills | Status: AC
Start: 2018-04-12 — End: 2018-04-19

## 2018-04-12 NOTE — Patient Instructions (Signed)
We will be in contact regarding the results of your culture.  Stay hydrated.  Let us know if you need anything.

## 2018-04-12 NOTE — Progress Notes (Signed)
Pre visit review using our clinic review tool, if applicable. No additional management support is needed unless otherwise documented below in the visit note. 

## 2018-04-12 NOTE — Progress Notes (Signed)
Chief Complaint  Patient presents with  . Urinary Frequency    urine odor    [name] is a 77 y.o. male here for possible UTI.  Duration: 4 days. Symptoms: urinary frequency, urinary hesitancy, urinary retention and dysuria Denies: hematuria, fever, nausea and flank Hx of recurrent UTI? No  He is not circumcised.  Denies new sexual partners.  ROS:  Constitutional: denies fever GU: As noted in HPI  Past Medical History:  Diagnosis Date  . Anemia   . Aortic stenosis   . Aortic stenosis, severe    S/p Edwards Sapien 3 Transcatheter Heart Valve (size 26 mm, model # [id number], serial # [id number])  . Arthritis   . Back pain   . Bursitis   . Cataract    left immature  . Cellulitis 10/12/2015  . Complication of anesthesia    Halucinations  . Constipation    takes Miralax daily as well as Senokot daily  . DDD (degenerative disc disease)   . Gout    takes Allopurinol daily  . Heart valve disorder   . History of blood clots 1962   knee  . History of blood transfusion    no abnormal reaction noted  . History of shingles   . Hyperlipidemia    takes Atorvastatin daily  . Hypertension    takes Lisinopril daily  . Joint pain   . Low back pain 01/25/2017  . Neck pain on left side 01/25/2017  . OSA (obstructive sleep apnea)   . Osteoarthritis   . Peripheral edema    takes Furosemide daily  . Peripheral neuropathy    takes Gabapentin daily  . Peroneal palsy    significant right foot drop  . Pneumonia 25+yrs ago   hx of  . Shortness of breath   . Swelling of extremity   . Urinary frequency   . Urinary urgency   . Valvular heart disease     BP 102/72 (BP Location: Left Arm, Patient Position: Sitting, Cuff Size: Large)   Pulse 82   Temp 98.1 F (36.7 C) (Oral)   Ht 6\' 2"  (1.88 m)   Wt (!) 374 lb (169.6 kg)   SpO2 96%   BMI 48.02 kg/m  General: Awake, alert, appears stated age Heart: RRR Lungs: CTAB, normal respiratory effort, no accessory muscle usage Abd: BS+,  soft, NT, ND, no masses or organomegaly MSK: No CVA tenderness, neg Lloyd's sign Psych: Age appropriate judgment and insight  Dysuria - Plan: POCT Urinalysis Dipstick (Automated), cephALEXin (KEFLEX) 500 MG capsule, Urine Culture  Frequent urination - Plan: POCT Urinalysis Dipstick (Automated), cephALEXin (KEFLEX) 500 MG capsule, Urine Culture  Orders as above. UA suggestive of infection. Will cx.  Stay hydrated. Seek immediate care if pt starts to develop fevers, new/worsening symptoms, uncontrollable N/V. F/u prn. The patient voiced understanding and agreement to the plan.  [place], DO 04/12/18 4:30 PM

## 2018-04-14 LAB — URINE CULTURE
MICRO NUMBER:: 90876116
SPECIMEN QUALITY:: ADEQUATE

## 2018-04-15 DIAGNOSIS — G4733 Obstructive sleep apnea (adult) (pediatric): Secondary | ICD-10-CM | POA: Diagnosis not present

## 2018-04-15 DIAGNOSIS — R0602 Shortness of breath: Secondary | ICD-10-CM | POA: Diagnosis not present

## 2018-04-17 ENCOUNTER — Telehealth: Payer: Self-pay | Admitting: Family Medicine

## 2018-04-17 DIAGNOSIS — I10 Essential (primary) hypertension: Secondary | ICD-10-CM

## 2018-04-17 NOTE — Telephone Encounter (Signed)
Spoke with patient he agreed with holding lisinopril and do blood work and a Marine scientist visit. Advised patient of message below

## 2018-04-17 NOTE — Telephone Encounter (Signed)
Copied from [place] [phone]. Topic: Quick Communication - See Telephone Encounter >> Apr 17, 2018  9:47 AM Ahmed Prima L wrote: CRM for notification. See Telephone encounter for: 04/17/18.  Patient states that he had labs done at the hospital on 7/23 and they advised him his kidney's were failing. Patient would like to speak to Dr Charlett Blake when she comes back in the office next week.

## 2018-04-17 NOTE — Telephone Encounter (Signed)
So notify him his creatinine did creep up with his urine infection. This will likely improve with treatment. He should make sure hydrate well and hold his Lisinopril this week. Then at end of this week o beginning of next week he should come in to have a cmp and RN BP check. If creatinine remains elevated we will refer to nephrology for consultation but make sure he understands he can stay at this level of kidney dysfunction for quite some time. We will just have to monitor and hydrate well. If they are very worried I can even place the nephrology consult now but unless this continues to worsen they will just follow along with [name]. It is up to them

## 2018-04-18 ENCOUNTER — Ambulatory Visit: Payer: PPO

## 2018-04-19 ENCOUNTER — Encounter (HOSPITAL_BASED_OUTPATIENT_CLINIC_OR_DEPARTMENT_OTHER): Payer: PPO | Attending: Physician Assistant

## 2018-04-19 DIAGNOSIS — G629 Polyneuropathy, unspecified: Secondary | ICD-10-CM | POA: Insufficient documentation

## 2018-04-19 DIAGNOSIS — L97819 Non-pressure chronic ulcer of other part of right lower leg with unspecified severity: Secondary | ICD-10-CM | POA: Diagnosis not present

## 2018-04-19 DIAGNOSIS — X58XXXA Exposure to other specified factors, initial encounter: Secondary | ICD-10-CM | POA: Diagnosis not present

## 2018-04-19 DIAGNOSIS — I10 Essential (primary) hypertension: Secondary | ICD-10-CM | POA: Diagnosis not present

## 2018-04-19 DIAGNOSIS — G4730 Sleep apnea, unspecified: Secondary | ICD-10-CM | POA: Diagnosis not present

## 2018-04-19 DIAGNOSIS — S81812A Laceration without foreign body, left lower leg, initial encounter: Secondary | ICD-10-CM | POA: Diagnosis not present

## 2018-04-19 DIAGNOSIS — I890 Lymphedema, not elsewhere classified: Secondary | ICD-10-CM | POA: Insufficient documentation

## 2018-04-20 DIAGNOSIS — S81802A Unspecified open wound, left lower leg, initial encounter: Secondary | ICD-10-CM | POA: Diagnosis not present

## 2018-04-21 DIAGNOSIS — J449 Chronic obstructive pulmonary disease, unspecified: Secondary | ICD-10-CM | POA: Diagnosis not present

## 2018-04-21 DIAGNOSIS — J9622 Acute and chronic respiratory failure with hypercapnia: Secondary | ICD-10-CM | POA: Diagnosis not present

## 2018-04-21 DIAGNOSIS — R0602 Shortness of breath: Secondary | ICD-10-CM | POA: Diagnosis not present

## 2018-04-21 DIAGNOSIS — G4733 Obstructive sleep apnea (adult) (pediatric): Secondary | ICD-10-CM | POA: Diagnosis not present

## 2018-04-25 ENCOUNTER — Other Ambulatory Visit (INDEPENDENT_AMBULATORY_CARE_PROVIDER_SITE_OTHER): Payer: PPO

## 2018-04-25 ENCOUNTER — Ambulatory Visit (INDEPENDENT_AMBULATORY_CARE_PROVIDER_SITE_OTHER): Payer: PPO | Admitting: Family Medicine

## 2018-04-25 ENCOUNTER — Telehealth: Payer: Self-pay

## 2018-04-25 ENCOUNTER — Encounter (INDEPENDENT_AMBULATORY_CARE_PROVIDER_SITE_OTHER): Payer: Self-pay

## 2018-04-25 VITALS — BP 119/56 | HR 45

## 2018-04-25 DIAGNOSIS — I10 Essential (primary) hypertension: Secondary | ICD-10-CM

## 2018-04-25 LAB — COMPREHENSIVE METABOLIC PANEL
ALT: 19 U/L (ref 0–53)
AST: 14 U/L (ref 0–37)
Albumin: 3.5 g/dL (ref 3.5–5.2)
Alkaline Phosphatase: 74 U/L (ref 39–117)
BUN: 35 mg/dL — ABNORMAL HIGH (ref 6–23)
CO2: 33 mEq/L — ABNORMAL HIGH (ref 19–32)
Calcium: 9.4 mg/dL (ref 8.4–10.5)
Chloride: 101 mEq/L (ref 96–112)
Creatinine, Ser: 1.28 mg/dL (ref 0.40–1.50)
GFR: 58.12 mL/min — ABNORMAL LOW (ref >60.00)
Glucose, Bld: 108 mg/dL — ABNORMAL HIGH (ref 70–99)
Potassium: 4.4 mEq/L (ref 3.5–5.1)
Sodium: 141 mEq/L (ref 135–145)
Total Bilirubin: 0.5 mg/dL (ref 0.2–1.2)
Total Protein: 5.7 g/dL — ABNORMAL LOW (ref 6.0–8.3)

## 2018-04-25 NOTE — Telephone Encounter (Signed)
Per Dr. Penni Homans patient in for labwork and BP check.

## 2018-04-25 NOTE — Progress Notes (Signed)
Pre visit review using our clinic tool,if applicable. No additional management support is needed unless otherwise documented below in the visit note.   Pt here for Blood pressure check per order from Dr. Charlett Blake dated  04/17/18.  Pt currently takes: Lasix 20 mg bid. Lisinopril 10 mg is on hold.   Pt reports compliance with medication.  BP today @ = 119/56 HR = 54   Pt advised per Dr. Charlett Blake to discontinue Lisinopril. Patient had CMP drawn before Nurse visit. Patient has appointment scheduled with provider for 06/2018.

## 2018-04-25 NOTE — Progress Notes (Signed)
Nursing blood pressure check note reviewed. Agree with documention and plan. 

## 2018-04-26 ENCOUNTER — Encounter (HOSPITAL_BASED_OUTPATIENT_CLINIC_OR_DEPARTMENT_OTHER): Payer: PPO | Attending: Physician Assistant

## 2018-04-26 ENCOUNTER — Encounter (HOSPITAL_COMMUNITY): Payer: Self-pay

## 2018-04-26 ENCOUNTER — Emergency Department (HOSPITAL_COMMUNITY): Payer: PPO

## 2018-04-26 ENCOUNTER — Telehealth: Payer: Self-pay | Admitting: *Deleted

## 2018-04-26 ENCOUNTER — Other Ambulatory Visit: Payer: Self-pay

## 2018-04-26 ENCOUNTER — Inpatient Hospital Stay (HOSPITAL_COMMUNITY)
Admission: EM | Admit: 2018-04-26 | Discharge: 2018-04-28 | DRG: 243 | Disposition: A | Discharge status: Home / Self Care | Payer: PPO | Attending: Internal Medicine | Admitting: Internal Medicine

## 2018-04-26 DIAGNOSIS — I442 Atrioventricular block, complete: Principal | ICD-10-CM | POA: Diagnosis present

## 2018-04-26 DIAGNOSIS — I10 Essential (primary) hypertension: Secondary | ICD-10-CM | POA: Diagnosis not present

## 2018-04-26 DIAGNOSIS — G4730 Sleep apnea, unspecified: Secondary | ICD-10-CM | POA: Insufficient documentation

## 2018-04-26 DIAGNOSIS — R001 Bradycardia, unspecified: Secondary | ICD-10-CM | POA: Diagnosis present

## 2018-04-26 DIAGNOSIS — I4891 Unspecified atrial fibrillation: Secondary | ICD-10-CM | POA: Diagnosis not present

## 2018-04-26 DIAGNOSIS — I872 Venous insufficiency (chronic) (peripheral): Secondary | ICD-10-CM | POA: Diagnosis not present

## 2018-04-26 DIAGNOSIS — M109 Gout, unspecified: Secondary | ICD-10-CM | POA: Diagnosis not present

## 2018-04-26 DIAGNOSIS — Z952 Presence of prosthetic heart valve: Secondary | ICD-10-CM

## 2018-04-26 DIAGNOSIS — Z6841 Body Mass Index (BMI) 40.0 and over, adult: Secondary | ICD-10-CM

## 2018-04-26 DIAGNOSIS — Z9989 Dependence on other enabling machines and devices: Secondary | ICD-10-CM | POA: Diagnosis not present

## 2018-04-26 DIAGNOSIS — E785 Hyperlipidemia, unspecified: Secondary | ICD-10-CM | POA: Diagnosis not present

## 2018-04-26 DIAGNOSIS — J984 Other disorders of lung: Secondary | ICD-10-CM | POA: Diagnosis not present

## 2018-04-26 DIAGNOSIS — Z993 Dependence on wheelchair: Secondary | ICD-10-CM

## 2018-04-26 DIAGNOSIS — Z09 Encounter for follow-up examination after completed treatment for conditions other than malignant neoplasm: Secondary | ICD-10-CM | POA: Insufficient documentation

## 2018-04-26 DIAGNOSIS — Z888 Allergy status to other drugs, medicaments and biological substances status: Secondary | ICD-10-CM | POA: Diagnosis not present

## 2018-04-26 DIAGNOSIS — I959 Hypotension, unspecified: Secondary | ICD-10-CM | POA: Diagnosis present

## 2018-04-26 DIAGNOSIS — Z86718 Personal history of other venous thrombosis and embolism: Secondary | ICD-10-CM | POA: Diagnosis not present

## 2018-04-26 DIAGNOSIS — I342 Nonrheumatic mitral (valve) stenosis: Secondary | ICD-10-CM | POA: Diagnosis not present

## 2018-04-26 DIAGNOSIS — Z950 Presence of cardiac pacemaker: Secondary | ICD-10-CM | POA: Diagnosis not present

## 2018-04-26 DIAGNOSIS — R55 Syncope and collapse: Secondary | ICD-10-CM | POA: Diagnosis not present

## 2018-04-26 DIAGNOSIS — Z87891 Personal history of nicotine dependence: Secondary | ICD-10-CM

## 2018-04-26 DIAGNOSIS — R0902 Hypoxemia: Secondary | ICD-10-CM | POA: Diagnosis not present

## 2018-04-26 DIAGNOSIS — G4733 Obstructive sleep apnea (adult) (pediatric): Secondary | ICD-10-CM | POA: Diagnosis present

## 2018-04-26 DIAGNOSIS — J9611 Chronic respiratory failure with hypoxia: Secondary | ICD-10-CM | POA: Diagnosis not present

## 2018-04-26 DIAGNOSIS — Z7982 Long term (current) use of aspirin: Secondary | ICD-10-CM | POA: Diagnosis not present

## 2018-04-26 DIAGNOSIS — Z872 Personal history of diseases of the skin and subcutaneous tissue: Secondary | ICD-10-CM | POA: Insufficient documentation

## 2018-04-26 DIAGNOSIS — Z79899 Other long term (current) drug therapy: Secondary | ICD-10-CM

## 2018-04-26 DIAGNOSIS — G629 Polyneuropathy, unspecified: Secondary | ICD-10-CM | POA: Diagnosis not present

## 2018-04-26 DIAGNOSIS — I441 Atrioventricular block, second degree: Secondary | ICD-10-CM

## 2018-04-26 DIAGNOSIS — I890 Lymphedema, not elsewhere classified: Secondary | ICD-10-CM | POA: Insufficient documentation

## 2018-04-26 DIAGNOSIS — J449 Chronic obstructive pulmonary disease, unspecified: Secondary | ICD-10-CM | POA: Diagnosis not present

## 2018-04-26 DIAGNOSIS — Z96651 Presence of right artificial knee joint: Secondary | ICD-10-CM | POA: Diagnosis not present

## 2018-04-26 DIAGNOSIS — I080 Rheumatic disorders of both mitral and aortic valves: Secondary | ICD-10-CM | POA: Diagnosis present

## 2018-04-26 DIAGNOSIS — Z95818 Presence of other cardiac implants and grafts: Secondary | ICD-10-CM

## 2018-04-26 DIAGNOSIS — Z7902 Long term (current) use of antithrombotics/antiplatelets: Secondary | ICD-10-CM | POA: Diagnosis not present

## 2018-04-26 DIAGNOSIS — R42 Dizziness and giddiness: Secondary | ICD-10-CM | POA: Diagnosis not present

## 2018-04-26 LAB — URINALYSIS, ROUTINE W REFLEX MICROSCOPIC
Bilirubin Urine: NEGATIVE
Glucose, UA: NEGATIVE mg/dL
Hgb urine dipstick: NEGATIVE
Ketones, ur: NEGATIVE mg/dL
Leukocytes, UA: NEGATIVE
Nitrite: NEGATIVE
Protein, ur: NEGATIVE mg/dL
Specific Gravity, Urine: 1.01 (ref 1.005–1.030)
pH: 5 (ref 5.0–8.0)

## 2018-04-26 LAB — COMPREHENSIVE METABOLIC PANEL
ALT: 24 U/L (ref 0–44)
AST: 27 U/L (ref 15–41)
Albumin: 3.2 g/dL — ABNORMAL LOW (ref 3.5–5.0)
Alkaline Phosphatase: 72 U/L (ref 38–126)
Anion gap: 12 (ref 5–15)
BUN: 36 mg/dL — ABNORMAL HIGH (ref 8–23)
CO2: 27 mmol/L (ref 22–32)
Calcium: 9.2 mg/dL (ref 8.9–10.3)
Chloride: 101 mmol/L (ref 98–111)
Creatinine, Ser: 1.43 mg/dL — ABNORMAL HIGH (ref 0.61–1.24)
GFR calc Af Amer: 54 mL/min — ABNORMAL LOW (ref >60)
GFR calc non Af Amer: 46 mL/min — ABNORMAL LOW (ref >60)
Glucose, Bld: 100 mg/dL — ABNORMAL HIGH (ref 70–99)
Potassium: 5.2 mmol/L — ABNORMAL HIGH (ref 3.5–5.1)
Sodium: 140 mmol/L (ref 135–145)
Total Bilirubin: 0.9 mg/dL (ref 0.3–1.2)
Total Protein: 5.8 g/dL — ABNORMAL LOW (ref 6.5–8.1)

## 2018-04-26 LAB — I-STAT TROPONIN, ED: Troponin i, poc: 0.01 ng/mL (ref 0.00–0.08)

## 2018-04-26 LAB — CBC
HCT: 35.5 % — ABNORMAL LOW (ref 39.0–52.0)
HCT: 37.2 % — ABNORMAL LOW (ref 39.0–52.0)
Hemoglobin: 10.9 g/dL — ABNORMAL LOW (ref 13.0–17.0)
Hemoglobin: 10.7 g/dL — ABNORMAL LOW (ref 13.0–17.0)
MCH: 30.8 pg (ref 26.0–34.0)
MCH: 29.7 pg (ref 26.0–34.0)
MCHC: 30.7 g/dL (ref 30.0–36.0)
MCHC: 28.8 g/dL — ABNORMAL LOW (ref 30.0–36.0)
MCV: 100.3 fL — ABNORMAL HIGH (ref 78.0–100.0)
MCV: 103.3 fL — ABNORMAL HIGH (ref 78.0–100.0)
Platelets: 164 10*3/uL (ref 150–400)
Platelets: 124 10*3/uL — ABNORMAL LOW (ref 150–400)
RBC: 3.54 MIL/uL — ABNORMAL LOW (ref 4.22–5.81)
RBC: 3.6 MIL/uL — ABNORMAL LOW (ref 4.22–5.81)
RDW: 16 % — ABNORMAL HIGH (ref 11.5–15.5)
RDW: 16.1 % — ABNORMAL HIGH (ref 11.5–15.5)
WBC: 8.1 10*3/uL (ref 4.0–10.5)
WBC: 7.6 10*3/uL (ref 4.0–10.5)

## 2018-04-26 LAB — CREATININE, SERUM
Creatinine, Ser: 1.24 mg/dL (ref 0.61–1.24)
GFR calc Af Amer: >60 mL/min (ref >60)
GFR calc non Af Amer: 55 mL/min — ABNORMAL LOW (ref >60)

## 2018-04-26 MED ORDER — SODIUM CHLORIDE 0.9% FLUSH
3.0000 mL | Freq: Two times a day (BID) | INTRAVENOUS | Status: DC
  Administered 2018-04-26 – 2018-04-27 (×2): 3 mL via INTRAVENOUS

## 2018-04-26 MED ORDER — CHLORHEXIDINE GLUCONATE 4 % EX LIQD
60.0000 mL | Freq: Once | CUTANEOUS | Status: AC
  Administered 2018-04-26: 4 via TOPICAL
  Filled 2018-04-26: qty 60

## 2018-04-26 MED ORDER — SODIUM CHLORIDE 0.9 % IV SOLN
INTRAVENOUS | Status: DC
  Administered 2018-04-27: 22:00:00 via INTRAVENOUS

## 2018-04-26 MED ORDER — DEXTROSE 5 % IV SOLN
3.0000 g | INTRAVENOUS | Status: AC
  Administered 2018-04-27: 3 g via INTRAVENOUS
  Filled 2018-04-26: qty 3000
  Filled 2018-04-26: qty 3

## 2018-04-26 MED ORDER — HEPARIN SODIUM (PORCINE) 5000 UNIT/ML IJ SOLN
5000.0000 [IU] | Freq: Three times a day (TID) | INTRAMUSCULAR | Status: DC
  Administered 2018-04-26 – 2018-04-27 (×2): 5000 [IU] via SUBCUTANEOUS
  Filled 2018-04-26 (×2): qty 1

## 2018-04-26 MED ORDER — SODIUM CHLORIDE 0.9% FLUSH: 3.0000 mL | INTRAVENOUS | Status: DC | PRN

## 2018-04-26 MED ORDER — VITAMIN D 1000 UNITS PO TABS
2000.0000 [IU] | ORAL_TABLET | Freq: Every day | ORAL | Status: DC
  Administered 2018-04-27 – 2018-04-28 (×2): 2000 [IU] via ORAL
  Filled 2018-04-26 (×2): qty 2

## 2018-04-26 MED ORDER — ALLOPURINOL 300 MG PO TABS
300.0000 mg | ORAL_TABLET | Freq: Every day | ORAL | Status: DC
  Administered 2018-04-27 – 2018-04-28 (×2): 300 mg via ORAL
  Filled 2018-04-26 (×2): qty 1

## 2018-04-26 MED ORDER — SODIUM CHLORIDE 0.9 % IV SOLN: 250.0000 mL | INTRAVENOUS | Status: DC

## 2018-04-26 MED ORDER — SODIUM CHLORIDE 0.9 % IV SOLN
80.0000 mg | INTRAVENOUS | Status: AC
  Administered 2018-04-27: 80 mg
  Filled 2018-04-26: qty 2

## 2018-04-26 MED ORDER — ACETAMINOPHEN 325 MG PO TABS
650.0000 mg | ORAL_TABLET | ORAL | Status: DC | PRN
  Administered 2018-04-26 – 2018-04-27 (×3): 650 mg via ORAL
  Filled 2018-04-26 (×3): qty 2

## 2018-04-26 MED ORDER — FUROSEMIDE 20 MG PO TABS
20.0000 mg | ORAL_TABLET | Freq: Two times a day (BID) | ORAL | Status: DC
  Administered 2018-04-27 – 2018-04-28 (×3): 20 mg via ORAL
  Filled 2018-04-26 (×3): qty 1

## 2018-04-26 MED ORDER — CHLORHEXIDINE GLUCONATE 4 % EX LIQD
60.0000 mL | Freq: Once | CUTANEOUS | Status: AC
  Administered 2018-04-27: 4 via TOPICAL
  Filled 2018-04-26: qty 60

## 2018-04-26 MED ORDER — ENSURE ENLIVE PO LIQD: 237.0000 mL | Freq: Two times a day (BID) | ORAL | Status: DC

## 2018-04-26 MED ORDER — GABAPENTIN 300 MG PO CAPS
300.0000 mg | ORAL_CAPSULE | Freq: Three times a day (TID) | ORAL | Status: DC
  Administered 2018-04-26 – 2018-04-28 (×5): 300 mg via ORAL
  Filled 2018-04-26 (×5): qty 1

## 2018-04-26 MED ORDER — SODIUM CHLORIDE 0.9 % IV SOLN: INTRAVENOUS | Status: DC

## 2018-04-26 MED ORDER — ASPIRIN EC 81 MG PO TBEC
81.0000 mg | DELAYED_RELEASE_TABLET | Freq: Every day | ORAL | Status: DC
  Administered 2018-04-27 – 2018-04-28 (×2): 81 mg via ORAL
  Filled 2018-04-26 (×2): qty 1

## 2018-04-26 NOTE — Telephone Encounter (Signed)
Advance Directives brought in, labeled and forwarded to provider for Initials; will then have scanned in to chart/SLS 08/07

## 2018-04-26 NOTE — ED Notes (Signed)
Admitting doctor at the bedside 

## 2018-04-26 NOTE — ED Provider Notes (Addendum)
[facility] EMERGENCY DEPARTMENT Provider Note   CSN: [id number] Arrival date & time: 04/26/18  1884     History   Chief Complaint Chief Complaint  Patient presents with  . Dizziness    HPI [name] is a 77 y.o. male.  Patient c/o dizziness, lightheadedness upon standing this AM. Symptoms moderate, persistent, constant, worse w standing. Mild sob. No chest pain. No fever or chills. No palpitations. Denies fall or syncope. Denies recent change in meds or new meds. No recent blood loss, rectal bleeding or melena. No vomiting or diarrhea. No gu c/o. No headache. No focal numbness or weakness.   The history is provided by the patient and the EMS personnel.  Dizziness  Associated symptoms: weakness   Associated symptoms: no chest pain, no headaches, no shortness of breath and no vomiting     Past Medical History:  Diagnosis Date  . Anemia   . Aortic stenosis   . Aortic stenosis, severe    S/p Edwards Sapien 3 Transcatheter Heart Valve (size 26 mm, model # [id number], serial # [id number])  . Arthritis   . Back pain   . Bursitis   . Cataract    left immature  . Cellulitis 10/12/2015  . Complication of anesthesia    Halucinations  . Constipation    takes Miralax daily as well as Senokot daily  . DDD (degenerative disc disease)   . Gout    takes Allopurinol daily  . Heart valve disorder   . History of blood clots 1962   knee  . History of blood transfusion    no abnormal reaction noted  . History of shingles   . Hyperlipidemia    takes Atorvastatin daily  . Hypertension    takes Lisinopril daily  . Joint pain   . Low back pain 01/25/2017  . Neck pain on left side 01/25/2017  . OSA (obstructive sleep apnea)   . Osteoarthritis   . Peripheral edema    takes Furosemide daily  . Peripheral neuropathy    takes Gabapentin daily  . Peroneal palsy    significant right foot drop  . Pneumonia 25+yrs ago   hx of  . Shortness of breath   . Swelling of  extremity   . Urinary frequency   . Urinary urgency   . Valvular heart disease     Patient Active Problem List   Diagnosis Date Noted  . Other fatigue 12/06/2017  . Shortness of breath on exertion 12/06/2017  . Essential hypertension 12/06/2017  . Hyperglycemia 07/28/2017  . Neck pain on left side 01/25/2017  . Low back pain 01/25/2017  . Constipation 08/15/2015  . Severe aortic valve stenosis   . Medicare annual wellness visit, subsequent 03/09/2015  . Preventative health care 03/09/2015  . Allergic rhinitis 12/02/2014  . COPD mixed type ([place]) 12/02/2014  . Chronic venous insufficiency 10/02/2013  . Venous stasis dermatitis 10/02/2013  . AVM (arteriovenous malformation) of colon with hemorrhage 02/10/2013  . Thrombocytopenia, unspecified ([place]) 02/09/2013  . EDEMA 04/15/2010  . HYPERCHOLESTEROLEMIA 04/14/2010  . Gout 04/14/2010  . Anemia 04/14/2010  . Aortic valve disorder 04/14/2010  . VALVULAR HEART DISEASE 04/14/2010  . Osteoarthritis 04/14/2010  . Multilevel degenerative disc disease 04/14/2010  . BURSITIS 04/14/2010  . OBESITY, MORBID 03/21/2010  . Obstructive sleep apnea 02/19/2008    Past Surgical History:  Procedure Laterality Date  . CARDIAC CATHETERIZATION N/A 05/02/2015   Procedure: Right/Left Heart Cath and Coronary Angiography;  Surgeon: Annita Brod  Angelena Form, MD;  Location: [facility];  Service: Cardiovascular;  Laterality: N/A;  . COLONOSCOPY N/A 02/07/2013   Procedure: COLONOSCOPY;  Surgeon: Juanita Craver, MD;  Location: [facility] ENDOSCOPY;  Service: Endoscopy;  Laterality: N/A;  . COLONOSCOPY N/A 02/09/2013   Procedure: COLONOSCOPY;  Surgeon: Beryle Beams, MD;  Location: [facility];  Service: Endoscopy;  Laterality: N/A;  . ESOPHAGOGASTRODUODENOSCOPY N/A 02/07/2013   Procedure: ESOPHAGOGASTRODUODENOSCOPY (EGD);  Surgeon: Juanita Craver, MD;  Location[facility] ENDOSCOPY;  Service: Endoscopy;  Laterality: N/A;  . GIVENS CAPSULE STUDY N/A 02/09/2013   Procedure:  GIVENS CAPSULE STUDY;  Surgeon: Beryle Beams, MD;  Location: [place];  Service: Endoscopy;  Laterality: N/A;  . HERNIA REPAIR     umbilical hernia  . KNEE SURGERY Right    multiple knee surgeries due to complication of R TKA  . LAMINOTOMY  6440   c6-t2  . SPINE SURGERY    . TEE WITHOUT CARDIOVERSION N/A 03/07/2015   Procedure: TRANSESOPHAGEAL ECHOCARDIOGRAM (TEE);  Surgeon: Josue Hector, MD;  Location: [facility] ENDOSCOPY;  Service: Cardiovascular;  Laterality: N/A;  ANES TO BRING PROPOFOL PER DOCTOR  . TEE WITHOUT CARDIOVERSION N/A 06/10/2015   Procedure: TRANSESOPHAGEAL ECHOCARDIOGRAM (TEE);  Surgeon: Burnell Blanks, MD;  Location: [place];  Service: Open Heart Surgery;  Laterality: N/A;  . TOTAL KNEE ARTHROPLASTY     right  . TRANSCATHETER AORTIC VALVE REPLACEMENT, TRANSFEMORAL Left 06/10/2015   Procedure: TRANSCATHETER AORTIC VALVE REPLACEMENT, TRANSFEMORAL;  Surgeon: Burnell Blanks, MD;  Location: [place];  Service: Open Heart Surgery;  Laterality: Left;        Home Medications    Prior to Admission medications   Medication Sig Start Date End Date Taking? Authorizing Provider  acetaminophen (TYLENOL) 500 MG tablet Take 500 mg by mouth 2 (two) times daily.     [provider]  allopurinol (ZYLOPRIM) 300 MG tablet Take 1 tablet (300 mg total) by mouth daily. 11/24/17   [name], MD  aspirin 81 MG EC tablet TAKE 1 TABLET (81 MG TOTAL) BY MOUTH DAILY. 06/01/16   Josue Hector, MD  atorvastatin (LIPITOR) 40 MG tablet Take 1 and a half tablet daily by mouth  (60mg ) 11/24/17   [name], MD  cholecalciferol (VITAMIN D) 1000 UNITS tablet Take 2,000 Units by mouth daily.     [provider]  clopidogrel (PLAVIX) 75 MG tablet TAKE 1 TABLET (75 MG TOTAL) BY MOUTH DAILY WITH BREAKFAST. 04/10/18   Josue Hector, MD  Coenzyme Q10 (COQ10) 200 MG CAPS Take 200 mg by mouth daily.    [provider]  furosemide (LASIX) 20 MG tablet TAKE 1 TABLET BY  MOUTH TWICE A DAY 09/19/17   Josue Hector, MD  gabapentin (NEURONTIN) 300 MG capsule Take 1 capsule (300 mg total) by mouth 3 (three) times daily. 03/02/18   [name], MD  lisinopril (PRINIVIL,ZESTRIL) 10 MG tablet Take 1 tablet (10 mg total) by mouth daily. Patient not taking: Reported on 04/25/2018 11/24/17   [name], MD  Misc Natural Products (TART CHERRY ADVANCED PO) Take 1 capsule by mouth daily.    [provider]  Multiple Vitamin (MULTIVITAMIN WITH MINERALS) TABS Take 1 tablet by mouth daily.    [provider]  polyethylene glycol (MIRALAX / GLYCOLAX) packet Take 17 g by mouth daily.    [provider]  sennosides-docusate sodium (SENOKOT-S) 8.6-50 MG tablet Take 1 tablet by mouth 2 (two) times daily.  [provider]    Family History Family History  Problem Relation Age of Onset  . Arthritis Sister        "crippling"  . Arthritis Sister     Social History Social History   Tobacco Use  . Smoking status: Former Smoker    Years: 30.00    Types: Pipe    Last attempt to quit: 09/20/2006    Years since quitting: 11.6  . Smokeless tobacco: Never Used  Substance Use Topics  . Alcohol use: No    Alcohol/week: 0.0 oz  . Drug use: No     Allergies   Coumadin [warfarin sodium]   Review of Systems Review of Systems  Constitutional: Negative for fever.  HENT: Negative for sore throat.   Eyes: Negative for visual disturbance.  Respiratory: Negative for shortness of breath.   Cardiovascular: Negative for chest pain.  Gastrointestinal: Negative for abdominal pain and vomiting.  Genitourinary: Negative for flank pain.  Musculoskeletal: Negative for back pain and neck pain.  Skin: Negative for rash.  Neurological: Positive for dizziness and weakness. Negative for headaches.  Hematological: Does not bruise/bleed easily.  Psychiatric/Behavioral: Negative for confusion.     Physical Exam Updated Vital Signs BP (!) 121/59    Pulse (!) 48   Temp 98.1 F (36.7 C) (Oral)   Resp 18   Ht 1.88 m (6\' 2" )   Wt (!) 169.6 kg (374 lb)   SpO2 96%   BMI 48.02 kg/m   Physical Exam  Constitutional: He is oriented to person, place, and time. He appears well-developed and well-nourished.  HENT:  Head: Atraumatic.  Mouth/Throat: Oropharynx is clear and moist.  Eyes: Conjunctivae are normal.  Neck: Neck supple. No tracheal deviation present. No thyromegaly present.  Cardiovascular: Normal rate, regular rhythm, normal heart sounds and intact distal pulses.  Pulmonary/Chest: Effort normal and breath sounds normal. No accessory muscle usage. No respiratory distress.  Abdominal: Soft. Bowel sounds are normal. He exhibits no distension and no mass. There is no tenderness. There is no guarding.  Genitourinary:  Genitourinary Comments: No cva tenderness  Musculoskeletal: He exhibits no edema or tenderness.  Neurological: He is alert and oriented to person, place, and time.  Speech clear/fluent. Motor/stre 5/5 bil. sens grossly intact.  Skin: Skin is warm and dry.  Psychiatric: He has a normal mood and affect.  Nursing note and vitals reviewed.    ED Treatments / Results  Labs (all labs ordered are listed, but only abnormal results are displayed) Results for orders placed or performed during the hospital encounter of 04/26/18  CBC  Result Value Ref Range   WBC 8.1 4.0 - 10.5 K/uL   RBC 3.54 (L) 4.22 - 5.81 MIL/uL   Hemoglobin 10.9 (L) 13.0 - 17.0 g/dL   HCT 35.5 (L) 39.0 - 52.0 %   MCV 100.3 (H) 78.0 - 100.0 fL   MCH 30.8 26.0 - 34.0 pg   MCHC 30.7 30.0 - 36.0 g/dL   RDW 16.0 (H) 11.5 - 15.5 %   Platelets 164 150 - 400 K/uL  Comprehensive metabolic panel  Result Value Ref Range   Sodium 140 135 - 145 mmol/L   Potassium 5.2 (H) 3.5 - 5.1 mmol/L   Chloride 101 98 - 111 mmol/L   CO2 27 22 - 32 mmol/L   Glucose, Bld 100 (H) 70 - 99 mg/dL   BUN 36 (H) 8 - 23 mg/dL   Creatinine, Ser 1.43 (H) 0.61 - 1.24 mg/dL  Calcium 9.2 8.9 - 10.3 mg/dL   Total Protein 5.8 (L) 6.5 - 8.1 g/dL   Albumin 3.2 (L) 3.5 - 5.0 g/dL   AST 27 15 - 41 U/L   ALT 24 0 - 44 U/L   Alkaline Phosphatase 72 38 - 126 U/L   Total Bilirubin 0.9 0.3 - 1.2 mg/dL   GFR calc non Af Amer 46 (L) >60 mL/min   GFR calc Af Amer 54 (L) >60 mL/min   Anion gap 12 5 - 15  Urinalysis, Routine w reflex microscopic  Result Value Ref Range   Color, Urine YELLOW YELLOW   APPearance CLEAR CLEAR   Specific Gravity, Urine 1.010 1.005 - 1.030   pH 5.0 5.0 - 8.0   Glucose, UA NEGATIVE NEGATIVE mg/dL   Hgb urine dipstick NEGATIVE NEGATIVE   Bilirubin Urine NEGATIVE NEGATIVE   Ketones, ur NEGATIVE NEGATIVE mg/dL   Protein, ur NEGATIVE NEGATIVE mg/dL   Nitrite NEGATIVE NEGATIVE   Leukocytes, UA NEGATIVE NEGATIVE  I-stat troponin, ED  Result Value Ref Range   Troponin i, poc 0.01 0.00 - 0.08 ng/mL   Comment 3            EKG EKG Interpretation  Date/Time:  Wednesday April 26 2018 09:58:11 EDT Ventricular Rate:  46 PR Interval:    QRS Duration: 139 QT Interval:  494 QTC Calculation: 398 R Axis:   107 Text Interpretation:  Sinus bradycardia Second deg AVB, Mobitz I (Wenckebach) Right bundle branch block Confirmed by Lajean Saver [phone]) on 04/26/2018 10:08:40 AM   Radiology Dg Chest Port 1 View  Result Date: 04/26/2018 CLINICAL DATA:  Patient woke up dizzy. EXAM: PORTABLE CHEST 1 VIEW COMPARISON:  None. FINDINGS: The heart size and mediastinal contours are within normal limits. Both lungs are clear. The visualized skeletal structures are unremarkable. IMPRESSION: No active disease. Electronically Signed   By: Kathreen Devoid   On: 04/26/2018 10:43    Procedures Procedures (including critical care time)  Medications Ordered in ED Medications  0.9 %  sodium chloride infusion (has no administration in time range)     Initial Impression / Assessment and Plan / ED Course  I have reviewed the triage vital signs and the nursing  notes.  Pertinent labs & imaging results that were available during my care of the patient were reviewed by me and considered in my medical decision making (see chart for details).  Iv ns. Continuous pulse ox and monitor.   Labs. Cxr.   Cardiology consulted.   Reviewed nursing notes and prior charts for additional history.   Recheck, bp normal. Hr 44. Await cardiology eval.  Labs reviewed - trop normal.   xrays reviewed - no pna.   Discussed pt with cardiology/Trish - Disposition per cardiology, anticipate probable admission.      Final Clinical Impressions(s) / ED Diagnoses   Final diagnoses:  None    ED Discharge Orders    None           Lajean Saver, MD 04/26/18 1523

## 2018-04-26 NOTE — H&P (Signed)
Cardiology Consultation:   Patient ID: [name]; [id number]; 1941-03-28   Admit date: 04/26/2018 Date of Consult: 04/26/2018  Primary Care Provider: [name], MD Primary Cardiologist: [name] Primary Electrophysiologist:  new   Patient Profile:   [name] is a 77 y.o. male with a hx of Super morbid obesity, OSA, COPD (described as severe obstructive airway disease by [name]), chronic hypoxic resp failure, compliant with BIPAP tx, HTN it seems with relative lower BP of late with recent down-titrations of his meds and recently stopped  Lisinopril, HTN, HLD, gout, and severe AS s/p TAVR in 2016, who is being seen today for the evaluation of syncope and advanced heart block at the request of [name].  History of Present Illness:   [name] last saw [name] in Sept 2019, at that time doing well, no changes were made to his regime for 1 year f/u.  He has been following with his PMD, pulmonologist and weight center MD, of late his BP has been lower and his lisinopril reduced and then stopped it seems as well 2/2 high Creat.  He comes to the ER today with unusual weakness, dizziness.  He states of late he has been feeling fine.  Had vacationed in [place] a few weeks ago, was quite hot, and had been started on lasix via his weight loss MD.  Recently had labs with creat 2 and UTI, his lisinopril ultimately stopped, he remains on lisinopril land his UTI treated.  He was worried about his kidneys, but otherwise feeling well, and yesterday told by his PMD his renal function better and likely had gotten dehydrated.    He is not taking any weight loss supplements or meds, but on a dietary plan. This morning he woke as usual, did his usual routine had a couple boiled eggs about 7.  Noted he felt a little weak, had to sit to collect himself.  About an hour later stood (using the help of his powered recliner and felt weak, dizzy,  And sat back down, tried again, and the same thing.  His  wife called 7 and he was brought in.  He has not had any CP, no hx dizziness, or syncope previously.  He has chronic LE edema, states he goes to a wound care center that helps with his skin LE, and both he and his wife report the current appearance of his legs/toes are his baseline for years.  He states he has had a number of tests and his circulation is good.   LABS: K+  5.2 BUN/Creat 36/1.43 poc Trop 0.01 WBC 8.1 H/H 10/35 Plts 164  11/24/17 TSH 1.71  Home meds reviewed No potential rate limiting, nodal blocking agents  Past Medical History:  Diagnosis Date  . Anemia   . Aortic stenosis   . Aortic stenosis, severe    S/p Edwards Sapien 3 Transcatheter Heart Valve (size 26 mm, model # [id number], serial # [id number])  . Arthritis   . Back pain   . Bursitis   . Cataract    left immature  . Cellulitis 10/12/2015  . Complication of anesthesia    Halucinations  . Constipation    takes Miralax daily as well as Senokot daily  . DDD (degenerative disc disease)   . Gout    takes Allopurinol daily  . Heart valve disorder   . History of blood clots 1962   knee  . History of blood transfusion    no abnormal reaction  noted  . History of shingles   . Hyperlipidemia    takes Atorvastatin daily  . Hypertension    takes Lisinopril daily  . Joint pain   . Low back pain 01/25/2017  . Neck pain on left side 01/25/2017  . OSA (obstructive sleep apnea)   . Osteoarthritis   . Peripheral edema    takes Furosemide daily  . Peripheral neuropathy    takes Gabapentin daily  . Peroneal palsy    significant right foot drop  . Pneumonia 25+yrs ago   hx of  . Shortness of breath   . Swelling of extremity   . Urinary frequency   . Urinary urgency   . Valvular heart disease     Past Surgical History:  Procedure Laterality Date  . CARDIAC CATHETERIZATION N/A 05/02/2015   Procedure: Right/Left Heart Cath and Coronary Angiography;  Surgeon: Burnell Blanks, MD;  Location: [place]  CV LAB;  Service: Cardiovascular;  Laterality: N/A;  . COLONOSCOPY N/A 02/07/2013   Procedure: COLONOSCOPY;  Surgeon: Juanita Craver, MD;  Location: [facility] ENDOSCOPY;  Service: Endoscopy;  Laterality: N/A;  . COLONOSCOPY N/A 02/09/2013   Procedure: COLONOSCOPY;  Surgeon: Beryle Beams, MD;  Location: [place];  Service: Endoscopy;  Laterality: N/A;  . ESOPHAGOGASTRODUODENOSCOPY N/A 02/07/2013   Procedure: ESOPHAGOGASTRODUODENOSCOPY (EGD);  Surgeon: Juanita Craver, MD;  Location[facility] ENDOSCOPY;  Service: Endoscopy;  Laterality: N/A;  . GIVENS CAPSULE STUDY N/A 02/09/2013   Procedure: GIVENS CAPSULE STUDY;  Surgeon: Beryle Beams, MD;  Location: [place];  Service: Endoscopy;  Laterality: N/A;  . HERNIA REPAIR     umbilical hernia  . KNEE SURGERY Right    multiple knee surgeries due to complication of R TKA  . LAMINOTOMY  1856   c6-t2  . SPINE SURGERY    . TEE WITHOUT CARDIOVERSION N/A 03/07/2015   Procedure: TRANSESOPHAGEAL ECHOCARDIOGRAM (TEE);  Surgeon: Lance Hector, MD;  Location: [facility] ENDOSCOPY;  Service: Cardiovascular;  Laterality: N/A;  ANES TO BRING PROPOFOL PER DOCTOR  . TEE WITHOUT CARDIOVERSION N/A 06/10/2015   Procedure: TRANSESOPHAGEAL ECHOCARDIOGRAM (TEE);  Surgeon: Burnell Blanks, MD;  Location: [place];  Service: Open Heart Surgery;  Laterality: N/A;  . TOTAL KNEE ARTHROPLASTY     right  . TRANSCATHETER AORTIC VALVE REPLACEMENT, TRANSFEMORAL Left 06/10/2015   Procedure: TRANSCATHETER AORTIC VALVE REPLACEMENT, TRANSFEMORAL;  Surgeon: Burnell Blanks, MD;  Location: [place];  Service: Open Heart Surgery;  Laterality: Left;     Home Medications:  Prior to Admission medications   Medication Sig Start Date End Date Taking? Authorizing Provider  acetaminophen (TYLENOL) 500 MG tablet Take 1,000 mg by mouth 2 (two) times daily.    Yes [provider]  allopurinol (ZYLOPRIM) 300 MG tablet Take 1 tablet (300 mg total) by mouth daily. 11/24/17  Yes Lance Lukes, MD    aspirin 81 MG EC tablet TAKE 1 TABLET (81 MG TOTAL) BY MOUTH DAILY. 06/01/16  Yes Lance Hector, MD  cholecalciferol (VITAMIN D) 1000 UNITS tablet Take 2,000 Units by mouth daily.    Yes [provider]  clopidogrel (PLAVIX) 75 MG tablet TAKE 1 TABLET (75 MG TOTAL) BY MOUTH DAILY WITH BREAKFAST. 04/10/18  Yes Lance Hector, MD  Coenzyme Q10 (COQ10) 200 MG CAPS Take 200 mg by mouth daily.   Yes [provider]  furosemide (LASIX) 20 MG tablet TAKE 1 TABLET BY MOUTH TWICE A DAY 09/19/17  Yes Lance Hector, MD  gabapentin (NEURONTIN)  300 MG capsule Take 1 capsule (300 mg total) by mouth 3 (three) times daily. 03/02/18  Yes Lance Lukes, MD  HEMATINIC/FOLIC ACID 768-0 MG TABS Take 1 tablet by mouth daily. 04/18/18  Yes [provider]  Misc Natural Products (TART CHERRY ADVANCED PO) Take 1 capsule by mouth daily.   Yes [provider]  Multiple Vitamin (MULTIVITAMIN WITH MINERALS) TABS Take 1 tablet by mouth daily.   Yes [provider]  polyethylene glycol (MIRALAX / GLYCOLAX) packet Take 17 g by mouth daily.   Yes [provider]  sennosides-docusate sodium (SENOKOT-S) 8.6-50 MG tablet Take 1 tablet by mouth 2 (two) times daily.   Yes [provider]  atorvastatin (LIPITOR) 40 MG tablet Take 1 and a half tablet daily by mouth  (60mg ) Patient not taking: Reported on 04/26/2018 11/24/17   Lance Lukes, MD  lisinopril (PRINIVIL,ZESTRIL) 10 MG tablet Take 1 tablet (10 mg total) by mouth daily. Patient not taking: Reported on 04/25/2018 11/24/17   Lance Lukes, MD    Inpatient Medications: Scheduled Meds:  Continuous Infusions: . sodium chloride     PRN Meds:   Allergies:    Allergies  Allergen Reactions  . Coumadin [Warfarin Sodium] Other (See Comments)    States he can't be on this-bleeds out    Social History:   Social History   Socioeconomic History  . Marital status: Married    Spouse name: [name]  . Number of  children: 3  . Years of education: Not on file  . Highest education level: Not on file  Occupational History  . Occupation: retired Investment banker, operational  . Financial resource strain: Not on file  . Food insecurity:    Worry: Not on file    Inability: Not on file  . Transportation needs:    Medical: Not on file    Non-medical: Not on file  Tobacco Use  . Smoking status: Former Smoker    Years: 30.00    Types: Pipe    Last attempt to quit: 09/20/2006    Years since quitting: 11.6  . Smokeless tobacco: Never Used  Substance and Sexual Activity  . Alcohol use: No    Alcohol/week: 0.0 oz  . Drug use: No  . Sexual activity: Never    Comment: lives with wife, retired, no dietary restrictions  Lifestyle  . Physical activity:    Days per week: Not on file    Minutes per session: Not on file  . Stress: Not on file  Relationships  . Social connections:    Talks on phone: Not on file    Gets together: Not on file    Attends religious service: Not on file    Active member of club or organization: Not on file    Attends meetings of clubs or organizations: Not on file    Relationship status: Not on file  . Intimate partner violence:    Fear of current or ex partner: Not on file    Emotionally abused: Not on file    Physically abused: Not on file    Forced sexual activity: Not on file  Other Topics Concern  . Not on file  Social History Narrative  . Not on file    Family History:   Family History  Problem Relation Age of Onset  . Arthritis Sister        "crippling"  . Arthritis Sister      ROS:  Please see the  history of present illness. All other ROS reviewed and negative.     Physical Exam/Data:   Vitals:   04/26/18 1200 04/26/18 1215 04/26/18 1230 04/26/18 1400  BP: (!) 66/44 109/60 (!) 113/32 128/65  Pulse: (!) 42 (!) 36 (!) 42 (!) 37  Resp: 11 20    Temp:      TempSrc:      SpO2: 96% 96% 90% 94%  Weight:      Height:       No intake or output data in  the 24 hours ending 04/26/18 1524 Filed Weights   04/26/18 0956  Weight: (!) 374 lb (169.6 kg)   Body mass index is 48.02 kg/m.  General:  Morbidly obese, in no acute distress HEENT: normal Lymph: no adenopathy Neck: no JVD Endocrine:  No thryomegaly Vascular: No carotid bruits Cardiac:  bradycardic, no significant murmur is appreciated, no gallops or rubs Lungs:  CTA b/l, no wheezing, rhonchi or rales  Abd: soft, nontender, obese  Ext: b/l LE have 2+ edema, chronic appearing skin changes including erythema and scale-like appearance, no open wounds are anoted.  He has blueish discoloration to a number of toes, his R second toe the worse.  He has easily palpable DP pulses b/l, PT are difficult to feel Musculoskeletal:  No deformities Skin: warm and dry  Neuro:  no gross focal abnormalities noted Psych:  Normal affect   EKG:  The EKG was personally reviewed and demonstrates:   04/26/18 advanced AV block, iRBBB, V rate 42/46bpm  12/06/17 SR 64bpm, iRBBB, 1st dgeree AVblock PR 243ms,QRS 16ms 02/04/17 SR 80bpm, iRBBB, 1st degree AVblock  Telemetry:  Telemetry was personally reviewed and demonstrates:   Significant motion artifact often, While I am with him, 2:1 V rates 38-40bpm, I don't see any clear evidence of higher AVblock  Relevant CV Studies:  06/11/18: TTE Study Conclusions - Procedure narrative: Imaging very difficult due to patient&'s   inability to ambulate to bed. Exam was performed with patient   sitting upright in wheelchair. - Left ventricle: The cavity size was normal. Systolic function was   normal. The estimated ejection fraction was in the range of 55%   to 60%. There was dynamic obstruction at restin the outflow   tract, with a peak gradient of 45 mm Hg. Wall motion was normal;   there were no regional wall motion abnormalities. Features are   consistent with a pseudonormal left ventricular filling pattern,   with concomitant abnormal relaxation and increased  filling   pressure (grade 2 diastolic dysfunction). Doppler parameters are   consistent with high ventricular filling pressure. - Aortic valve: S/P TAVR . The valve is poorly visualized and   cannot comment on leaflet mobility. There is no obvious   paravalvular leak. The mean gradient is 62mmHg. - Mitral valve: Severely calcified annulus. The findings are   consistent with severe stenosis. Mean gradient (D): 12 mm Hg.   Peak gradient (D): 22 mm Hg. Valve area by pressure half-time:   1.56 cm^2. - Impressions: Poor images limited adequate study due to patient   being wheelchair bound and obese. Imaging done with patient   sitting in wheelchair.  05/02/15 1. No angiographic evidence of CAD 2. Severe aortic valve stenosis (peak to peak gradient 107 mmHg, mean gradient 70.3 mmHg, AVA 0.8 cm2)   Laboratory Data:  Chemistry Recent Labs  Lab 04/25/18 1020 04/26/18 0955  NA 141 140  K 4.4 5.2*  CL 101 101  CO2 33* 27  GLUCOSE 108* 100*  BUN 35* 36*  CREATININE 1.28 1.43*  CALCIUM 9.4 9.2  GFRNONAA  --  46*  GFRAA  --  54*  ANIONGAP  --  12    Recent Labs  Lab 04/25/18 1020 04/26/18 0955  PROT 5.7* 5.8*  ALBUMIN 3.5 3.2*  AST 14 27  ALT 19 24  ALKPHOS 74 72  BILITOT 0.5 0.9   Hematology Recent Labs  Lab 04/26/18 0955  WBC 8.1  RBC 3.54*  HGB 10.9*  HCT 35.5*  MCV 100.3*  MCH 30.8  MCHC 30.7  RDW 16.0*  PLT 164   Cardiac EnzymesNo results for input(s): TROPONINI in the last 168 hours.  Recent Labs  Lab 04/26/18 1008  TROPIPOC 0.01    BNPNo results for input(s): BNP, PROBNP in the last 168 hours.  DDimer No results for input(s): DDIMER in the last 168 hours.  Radiology/Studies:  Dg Chest Port 1 View  Result Date: 04/26/2018 CLINICAL DATA:  Patient woke up dizzy. EXAM: PORTABLE CHEST 1 VIEW COMPARISON:  None. FINDINGS: The heart size and mediastinal contours are within normal limits. Both lungs are clear. The visualized skeletal structures are  unremarkable. IMPRESSION: No active disease. Electronically Signed   By: Kathreen Devoid   On: 04/26/2018 10:43    Assessment and Plan:   1. Symptomatic bradycardia 2. Advanced Heart block 3. Baseline conduction system disease  At rest/supine he is asymptomatic, BP looks good I do not see any reversible causes and will ned pacing I discuss PPM implant with the patient, rational/indication, the procedure, it's risks and benefits, he is willing to proceed  His BP is stable, likely plan for tomorrow Dr. Rayann Heman will see  4. OSA     Wears BIPAP at night, will order respiratory      His last visit with [name] (01/02/18) noted settings: BiPAP 12/12 with oxygen 2 L  5. HTN     Relative hypotension of late     BP looks good in ER, no meds for now  6. Hx of TAVR     Update echo  7. Recent UTI, acute renal insuff     UA here is ok, Creat better, baseline looks about 1.3-1.6   For questions or updates, please contact [place] Please consult [email or web address] for contact info under Cardiology/STEMI.   Signed, Baldwin Jamaica, PA-C  04/26/2018 3:24 PM   I have seen, examined the patient, and reviewed the above assessment and plan.  Changes to above are made where necessary.  On exam, morbidly obese, chronically ill with edema noted,  Bradycardiac irregular rhythm.  The patient has near complete AV block on ekg.  No reversible causes are found.   I would therefore recommend pacemaker implantation at this time.  Risks, benefits, alternatives to pacemaker implantation were discussed in detail with the patient today. The patient understands that the risks include but are not limited to bleeding, infection, pneumothorax, perforation, tamponade, vascular damage, renal failure, MI, stroke, death,  and lead dislodgement and wishes to proceed. We will therefore schedule the procedure at the next available time.   We will make NPO for tentative pacemaker implant tomorrow.  Co Sign: Thompson Grayer,  MD 04/26/2018

## 2018-04-26 NOTE — ED Notes (Signed)
Pt eating a hamburgerf from upstairs  That his wife went to get    No one asked if the pt could eat

## 2018-04-26 NOTE — ED Triage Notes (Signed)
Pt c/o dizziness that began this morning around 6 am when he woke up ; pt denies any CP or SOB ; ems reports ekg showed patient was brady 40's- 50's or in 2nd degree heart block ;  BP 130/44, 02% 94 RA , RR 18 ,cbg 128

## 2018-04-27 ENCOUNTER — Encounter (HOSPITAL_COMMUNITY): Payer: Self-pay

## 2018-04-27 ENCOUNTER — Inpatient Hospital Stay (HOSPITAL_COMMUNITY): Payer: PPO

## 2018-04-27 ENCOUNTER — Encounter (HOSPITAL_COMMUNITY)
Admission: EM | Disposition: A | Discharge status: Home / Self Care | Payer: Self-pay | Source: Home / Self Care | Attending: Internal Medicine

## 2018-04-27 DIAGNOSIS — I342 Nonrheumatic mitral (valve) stenosis: Secondary | ICD-10-CM

## 2018-04-27 HISTORY — PX: PACEMAKER IMPLANT: EP1218

## 2018-04-27 LAB — ECHOCARDIOGRAM COMPLETE
Height: 74 in
Weight: 5984 oz

## 2018-04-27 LAB — SURGICAL PCR SCREEN
MRSA, PCR: NEGATIVE
Staphylococcus aureus: POSITIVE — AB

## 2018-04-27 SURGERY — PACEMAKER IMPLANT

## 2018-04-27 MED ORDER — LIDOCAINE HCL (PF) 1 % IJ SOLN
INTRAMUSCULAR | Status: AC
  Filled 2018-04-27: qty 30

## 2018-04-27 MED ORDER — LIDOCAINE HCL (PF) 1 % IJ SOLN
INTRAMUSCULAR | Status: DC | PRN
  Administered 2018-04-27: 90 mL

## 2018-04-27 MED ORDER — CEFAZOLIN SODIUM-DEXTROSE 2-4 GM/100ML-% IV SOLN
2.0000 g | Freq: Four times a day (QID) | INTRAVENOUS | Status: AC
Start: 2018-04-27 — End: 2018-04-28
  Administered 2018-04-27 – 2018-04-28 (×3): 2 g via INTRAVENOUS
  Filled 2018-04-27 (×3): qty 100

## 2018-04-27 MED ORDER — HEPARIN (PORCINE) IN NACL 1000-0.9 UT/500ML-% IV SOLN
INTRAVENOUS | Status: AC
  Filled 2018-04-27: qty 500

## 2018-04-27 MED ORDER — SODIUM CHLORIDE 0.9 % IV SOLN
INTRAVENOUS | Status: AC
  Filled 2018-04-27: qty 2

## 2018-04-27 MED ORDER — HEPARIN (PORCINE) IN NACL 1000-0.9 UT/500ML-% IV SOLN
INTRAVENOUS | Status: DC | PRN
  Administered 2018-04-27: 500 mL

## 2018-04-27 MED ORDER — ORAL CARE MOUTH RINSE
15.0000 mL | Freq: Two times a day (BID) | OROMUCOSAL | Status: DC
  Administered 2018-04-27 (×3): 15 mL via OROMUCOSAL

## 2018-04-27 MED ORDER — LIDOCAINE HCL (PF) 1 % IJ SOLN
INTRAMUSCULAR | Status: AC
  Filled 2018-04-27: qty 60

## 2018-04-27 MED ORDER — ONDANSETRON HCL 4 MG/2ML IJ SOLN: 4.0000 mg | Freq: Four times a day (QID) | INTRAMUSCULAR | Status: DC | PRN

## 2018-04-27 SURGICAL SUPPLY — 6 items
CABLE SURGICAL S-101-97-12 (CABLE) ×2 IMPLANT
LEAD TENDRIL MRI 52CM LPA1200M (Lead) ×1 IMPLANT
LEAD TENDRIL MRI 58CM LPA1200M (Lead) ×1 IMPLANT
PACEMAKER ASSURITY DR-RF (Pacemaker) ×1 IMPLANT
SHEATH CLASSIC 8F (SHEATH) ×2 IMPLANT
TRAY PACEMAKER INSERTION (PACKS) ×2 IMPLANT

## 2018-04-27 NOTE — Progress Notes (Signed)
Secretary called admitting, bed placement, and elink and all confirmed that you cannot switch a patient from "inpatient" to "observation" at this time.   [place]  Antolin Belsito, RN

## 2018-04-27 NOTE — Progress Notes (Signed)
Pt places self on and off home BIPAP machine with home settings, Pt resting comfortably and will cont to monitor.

## 2018-04-27 NOTE — Progress Notes (Signed)
Patient admitted to 2C04 alert & oriented x4, denies pain or discomfort.

## 2018-04-27 NOTE — Progress Notes (Signed)
RT notified of the need for Bipap during PPM implant.  Will call RT once we are preparing to receive the patient.

## 2018-04-27 NOTE — Progress Notes (Signed)
  Echocardiogram 2D Echocardiogram has been performed.  [name] 04/27/2018, 9:19 AM

## 2018-04-27 NOTE — Progress Notes (Signed)
Placed patient on BIPAP for the night with IPAP set at 16cm and EPAP set at 6cm. Oxygen set at 2lpm

## 2018-04-27 NOTE — Progress Notes (Signed)
Nutrition Brief Note  Patient identified on the Malnutrition Screening Tool (MST) Report  Wt Readings from Last 15 Encounters:  04/26/18 (!) 169.6 kg  04/12/18 (!) 169.6 kg  04/11/18 (!) 169.6 kg  03/28/18 (!) 168.7 kg  03/16/18 (!) 168.7 kg  02/08/18 (!) 170.1 kg  01/25/18 (!) 171 kg  01/03/18 (!) 172.4 kg  12/20/17 (!) 175.5 kg  12/19/17 (!) 175.5 kg  12/06/17 (!) 174.6 kg  07/28/17 (!) 190.5 kg  02/04/17 (!) 181.4 kg  01/25/17 (!) 181.4 kg  11/11/16 (!) 174.6 kg   [name] is a 77 y.o. male with a hx of Super morbid obesity, OSA, COPD (described as severe obstructive airway disease by Dr. Annamaria Boots), chronic hypoxic resp failure, compliant with BIPAP tx, HTN it seems with relative lower BP of late with recent down-titrations of his meds and recently stopped  Lisinopril, HTN, HLD, gout, and severe AS s/p TAVR in 2016, who is being seen today for the evaluation of syncope and advanced heart block at the request of Dr. Ashok Cordia.  Pt admitted with symptomatic bradycardia and advanced heart block.   Pt NPO for PPM implant. Pt in cath lab at time of visit.   Reviewed wt hx; wt has been stable > 1 year.   Body mass index is 48.02 kg/m. Patient meets criteria for extreme obesity, class III based on current BMI.   Current diet order is NPO, patient is consuming approximately n/a% of meals at this time. Labs and medications reviewed.   No nutrition interventions warranted at this time. If nutrition issues arise, please consult RD.   [name], RD, LDN, CDE Pager: [phone] After hours Pager: [phone]

## 2018-04-27 NOTE — Progress Notes (Signed)
Pt placed on BiPAP in cath lab for PPM implant procedure.

## 2018-04-27 NOTE — Progress Notes (Signed)
Progress Note  Patient Name: [name] Date of Encounter: 04/27/2018  Primary Cardiologist: No primary care provider on file.   Subjective   Feeling well today, remains in high grade AV block  Inpatient Medications    Scheduled Meds: . allopurinol  300 mg Oral Daily  . aspirin EC  81 mg Oral Daily  . cholecalciferol  2,000 Units Oral Daily  . feeding supplement (ENSURE ENLIVE)  237 mL Oral BID BM  . furosemide  20 mg Oral BID  . gabapentin  300 mg Oral TID  . gentamicin irrigation  80 mg Irrigation To SS-Surg  . mouth rinse  15 mL Mouth Rinse BID  . sodium chloride flush  3 mL Intravenous Q12H   Continuous Infusions: . sodium chloride    . sodium chloride    . sodium chloride    .  ceFAZolin (ANCEF) IV     PRN Meds: acetaminophen, sodium chloride flush   Vital Signs    Vitals:   04/27/18 0027 04/27/18 0045 04/27/18 0400 04/27/18 0717  BP: (!) 158/116  (!) 122/58 (!) 72/37  Pulse: 70 75 71 69  Resp: (!) 22 19 14 15   Temp: 97.7 F (36.5 C)  (!) 97.5 F (36.4 C) 97.9 F (36.6 C)  TempSrc: Oral  Axillary Oral  SpO2: 92% 96% 100% 94%  Weight:      Height:        Intake/Output Summary (Last 24 hours) at 04/27/2018 0730 Last data filed at 04/27/2018 0534 Gross per 24 hour  Intake -  Output 1000 ml  Net -1000 ml   Filed Weights   04/26/18 0956  Weight: (!) 169.6 kg    Telemetry    Mobitz I with intermittent complete AV block - Personally Reviewed  ECG    High grade AV block - Personally Reviewed  Physical Exam   GEN: No acute distress.   Neck: No JVD Cardiac: bradycardic, no murmurs, rubs, or gallops.  Respiratory: Clear to auscultation bilaterally. GI: Soft, nontender, non-distended  MS: No edema; No deformity. Neuro:  Nonfocal  Psych: Normal affect   Labs    Chemistry Recent Labs  Lab 04/25/18 1020 04/26/18 0955 04/26/18 1802  NA 141 140  --   K 4.4 5.2*  --   CL 101 101  --   CO2 33* 27  --   GLUCOSE 108* 100*  --   BUN 35*  36*  --   CREATININE 1.28 1.43* 1.24  CALCIUM 9.4 9.2  --   PROT 5.7* 5.8*  --   ALBUMIN 3.5 3.2*  --   AST 14 27  --   ALT 19 24  --   ALKPHOS 74 72  --   BILITOT 0.5 0.9  --   GFRNONAA  --  46* 55*  GFRAA  --  54* >60  ANIONGAP  --  12  --      Hematology Recent Labs  Lab 04/26/18 0955 04/26/18 1802  WBC 8.1 7.6  RBC 3.54* 3.60*  HGB 10.9* 10.7*  HCT 35.5* 37.2*  MCV 100.3* 103.3*  MCH 30.8 29.7  MCHC 30.7 28.8*  RDW 16.0* 16.1*  PLT 164 124*    Cardiac EnzymesNo results for input(s): TROPONINI in the last 168 hours.  Recent Labs  Lab 04/26/18 1008  TROPIPOC 0.01     BNPNo results for input(s): BNP, PROBNP in the last 168 hours.   DDimer No results for input(s): DDIMER in the last 168 hours.  Radiology    Dg Chest Port 1 View  Result Date: 04/26/2018 CLINICAL DATA:  Patient woke up dizzy. EXAM: PORTABLE CHEST 1 VIEW COMPARISON:  None. FINDINGS: The heart size and mediastinal contours are within normal limits. Both lungs are clear. The visualized skeletal structures are unremarkable. IMPRESSION: No active disease. Electronically Signed   By: Kathreen Devoid   On: 04/26/2018 10:43    Cardiac Studies   TTE 06/11/16 - Left ventricle: The cavity size was normal. Systolic function was   normal. The estimated ejection fraction was in the range of 55%   to 60%. There was dynamic obstruction at restin the outflow   tract, with a peak gradient of 45 mm Hg. Wall motion was normal;   there were no regional wall motion abnormalities. Features are   consistent with a pseudonormal left ventricular filling pattern,   with concomitant abnormal relaxation and increased filling   pressure (grade 2 diastolic dysfunction). Doppler parameters are   consistent with high ventricular filling pressure. - Aortic valve: S/P TAVR . The valve is poorly visualized and   cannot comment on leaflet mobility. There is no obvious   paravalvular leak. The mean gradient is 27mmHg. - Mitral  valve: Severely calcified annulus. The findings are   consistent with severe stenosis. Mean gradient (D): 12 mm Hg.   Peak gradient (D): 22 mm Hg. Valve area by pressure half-time:   1.56 cm^2.  Patient Profile     77 y.o. male history of super morbid obesity, OSA, COPD, chronic hypoxic respiratory failure, hypertension who presented to the hospital with high-grade AV block, weakness, and fatigue.  Assessment & Plan    1.  Mobitz 1 AV block with evidence of high-grade heart block and symptomatic bradycardia: Plan for pacemaker implant today.  [name] has presented today for surgery, with the diagnosis of symptomatic bradycardia.  The various methods of treatment have been discussed with the patient and family. After consideration of risks, benefits and other options for treatment, the patient has consented to  Procedure(s): Pacemaker implant as a surgical intervention .  Risks include but not limited to bleeding, tamponade, infection, pneumothorax, among others. The patient's history has been reviewed, patient examined, no change in status, stable for surgery.  I have reviewed the patient's chart and labs.  Questions were answered to the patient's satisfaction.    2.  OSA: Currently wears BiPAP.  Patient is compliant.  We [name] arrange to have BiPAP during the pacemaker implant.  3.  Hypertension: Has been up and down throughout this hospitalization.  Patient is asymptomatic.  No changes.  4.  Aortic stenosis status post TAVR: Plan for echo today  For questions or updates, please contact [place] Please consult [email or web address] for contact info under Cardiology/STEMI.      Signed, Lance Berrocal [name], MD  04/27/2018, 7:30 AM

## 2018-04-27 NOTE — Progress Notes (Signed)
Cards notified of BP 90/52, however, current BP 125/51.   [place]  Terrel Nesheiwat, RN

## 2018-04-28 ENCOUNTER — Encounter (HOSPITAL_COMMUNITY): Payer: Self-pay | Admitting: Cardiology

## 2018-04-28 ENCOUNTER — Inpatient Hospital Stay (HOSPITAL_COMMUNITY): Payer: PPO

## 2018-04-28 MED FILL — Gentamicin Sulfate Inj 40 MG/ML: INTRAMUSCULAR | Qty: 80 | Status: AC

## 2018-04-28 NOTE — Discharge Summary (Addendum)
ELECTROPHYSIOLOGY PROCEDURE DISCHARGE SUMMARY    Patient ID: [name],  MRN: [id number], DOB/AGE: 05-09-1941 77 y.o.  Admit date: 04/26/2018 Discharge date: 04/28/2018  Primary Care Physician: [name], MD  Primary Cardiologist: Dr. [name] Electrophysiologist: new, Dr. Curt Bears  Primary Discharge Diagnosis:  1. Advanced heart block, Mobitz I, 2:1 2. Symptomatic bradycardia  Secondary Discharge Diagnosis:  1. Chronic hypoxic respiratory failure      OSA on HS BIPAP w/O2 2. COPD 3. Super morbid obesity 4. HTN 5. VHD     h/o TAVR 2016 6. LE Venous insufficiency     Chronic skin changes   Allergies  Allergen Reactions  . Coumadin [Warfarin Sodium] Other (See Comments)    States he can't be on this-bleeds out     Procedures This Admission:  1.  Implantation of a SJM dual chamber PPM on 04/27/18 by Dr Curt Bears.  The patient received a [employer] MRI  model [id number] (serial number  [id number] ) pacemaker, [employer] model Tendril [id number] (serial number  [id number]) right atrial lead and a [facility] model Tendril [id number] (serial number  [id number]) right ventricular lead  There were no immediate post procedure complications. 2.  CXR on 04/28/18 demonstrated no pneumothorax status post device implantation.   Brief HPI: [name] is a 77 y.o. male was admitted to [facility] with c/o acute onset of weakness, dizziness, found bradycardiac with Mobitz 1 and 2:1 AVblock with V rates 30's-40's  Hospital Course:  The patient was admitted via the ER  with acute onset of unusual weakness, dizziness.  He repotred of late he has been feeling fine.  Had vacationed in [place] a few weeks ago, was quite hot, and had been started on lasix via his weight loss MD.  Recently had labs with creat 2 and UTI, his lisinopril ultimately stopped, he remains on lisinopril and his UTI treated.  He was worried about his kidneys, but otherwise feeling well, and had been told by his PMD  his renal function better and likely had gotten dehydrated.    He denies taking any weight loss supplements or meds, but on a dietary plan alone for weight loss. The morning of his admission he woke as usual, did his usual routine had a couple boiled eggs about 7.  Noted he felt a little weak, had to sit to collect himself.  About an hour later stood (using the help of his powered recliner and felt weak, dizzy,  And sat back down, tried again, and the same thing.  His wife called 64 and he was brought in.  He has not had any CP, no prior hx of dizziness, or syncope previously.  In the ER he was found to be bradycardiac with Mobit I, intermittently 2:1 with V rates to high 30's-40's  No reversible causes were found.  TTE was done, LVEF 60-65%the gradient across the TAVR, valve has increased to 22 mmHg. There is mild paravalvular leak.  LVEF is higher at 60-65%. There is moderate mitral stenosis with a mean gradient of 13 mmHg and MVA around 1.17 cm2. Severe biatrial enlargment is noted.  He underwent implantation of a PPM with details as outlined above.  He was monitored on telemetry overnight which demonstrated SR/VP and AV paced rhythm.  Left chest was without hematoma or ecchymosis.  The device was interrogated and found to be functioning normally.  CXR was obtained and demonstrated no pneumothorax status post device implantation.  Wound  care, arm mobility, and restrictions were reviewed with the patient.  The patient is feeling well this morning, he was examined by Dr. Curt Bears and considered stable for discharge to home.  We have asked PT to see the patient to see the patient to give teaching with transfer and walker use with his temporary LUE restrictions.    F/u with Dr. [name] Kristeena Meineke ne arranged as well.    Physical Exam: Vitals:   04/27/18 1935 04/27/18 2300 04/28/18 0350 04/28/18 0756  BP: (!) 110/51 112/62 (!) 117/56 (!) 101/57  Pulse: 74 65 63 67  Resp: 19 (!) 21 16 18   Temp: 98 F  (36.7 C) 98 F (36.7 C) 98.1 F (36.7 C) 98 F (36.7 C)  TempSrc: Oral Oral Oral Oral  SpO2: 98% 91% 93% 95%  Weight:      Height:        GEN- The patient is well appearing, alert and oriented x 3 today.   HEENT: normocephalic, atraumatic; sclera clear, conjunctiva pink; hearing intact; oropharynx clear; neck supple, no JVP Lungs- CTA b/l, normal work of breathing.  No wheezes, rales, rhonchi Heart- RRR, no murmurs, rubs or gallops, PMI not laterally displaced GI- soft, non-tender, obese Extremities- no clubbing, cyanosis, or edema MS- no significant deformity or atrophy Skin- warm and dry, no rash or lesion, left chest without hematoma/ecchymosis Psych- euthymic mood, full affect Neuro- no gross deficits   Labs:   Lab Results  Component Value Date   WBC 7.6 04/26/2018   HGB 10.7 (L) 04/26/2018   HCT 37.2 (L) 04/26/2018   MCV 103.3 (H) 04/26/2018   PLT 124 (L) 04/26/2018    Recent Labs  Lab 04/26/18 0955 04/26/18 1802  NA 140  --   K 5.2*  --   CL 101  --   CO2 27  --   BUN 36*  --   CREATININE 1.43* 1.24  CALCIUM 9.2  --   PROT 5.8*  --   BILITOT 0.9  --   ALKPHOS 72  --   ALT 24  --   AST 27  --   GLUCOSE 100*  --     Discharge Medications:  Allergies as of 04/28/2018      Reactions   Coumadin [warfarin Sodium] Other (See Comments)   States he can't be on this-bleeds out      Medication List    TAKE these medications   acetaminophen 500 MG tablet Commonly known as:  TYLENOL Take 1,000 mg by mouth 2 (two) times daily.   allopurinol 300 MG tablet Commonly known as:  ZYLOPRIM Take 1 tablet (300 mg total) by mouth daily.   aspirin 81 MG EC tablet TAKE 1 TABLET (81 MG TOTAL) BY MOUTH DAILY.   atorvastatin 40 MG tablet Commonly known as:  LIPITOR Take 1 and a half tablet daily by mouth  (60mg )   cholecalciferol 1000 units tablet Commonly known as:  VITAMIN D Take 2,000 Units by mouth daily.   clopidogrel 75 MG tablet Commonly known as:   PLAVIX TAKE 1 TABLET (75 MG TOTAL) BY MOUTH DAILY WITH BREAKFAST.   CoQ10 200 MG Caps Take 200 mg by mouth daily.   furosemide 20 MG tablet Commonly known as:  LASIX TAKE 1 TABLET BY MOUTH TWICE A DAY   gabapentin 300 MG capsule Commonly known as:  NEURONTIN Take 1 capsule (300 mg total) by mouth 3 (three) times daily.   HEMATINIC/FOLIC ACID 299-3 MG Tabs Generic drug:  Ferrous Fumarate-Folic  Acid Take 1 tablet by mouth daily.   lisinopril 10 MG tablet Commonly known as:  PRINIVIL,ZESTRIL Take 1 tablet (10 mg total) by mouth daily.   multivitamin with minerals Tabs tablet Take 1 tablet by mouth daily.   polyethylene glycol packet Commonly known as:  MIRALAX / GLYCOLAX Take 17 g by mouth daily.   sennosides-docusate sodium 8.6-50 MG tablet Commonly known as:  SENOKOT-S Take 1 tablet by mouth 2 (two) times daily.   TART CHERRY ADVANCED PO Take 1 capsule by mouth daily.       Disposition: Home   Follow-up Information    Josue Hector, MD Follow up.   Specialty:  Cardiology Contact information: [address] [address] [phone]        [facility] .   Specialty:  Cardiology Contact information: [address], [place]       Constance Haw, MD .   Specialty:  Cardiology Contact information: [address] [place] [address] [phone]           Duration of Discharge Encounter: Greater than 30 minutes including physician time.  SignedTommye Standard, PA-C 04/28/2018 11:26 AM   I have seen and examined this patient with Tommye Standard.  Agree with above, note added to reflect my findings.  On exam, RRR, no murmurs, lungs clear. St. Jude pacemaker implanted for high grade AV block. Interrogation and CXR without major abnormality. Plan for discharge with follow up in device clinic.    Maren Wiesen M. Orlo Brickle MD 04/28/2018 11:50  AM

## 2018-04-28 NOTE — Progress Notes (Addendum)
PT evaluation recommended SNF/rehab.  The patient declines but is agreeable to HHPT.  We will order this.  I have re-enforced LUE restriction and the importance of why.  He reports his recliner is an automatic chair that lifts him to a standing position, has a high toilet that he is able to manage with and will use his R arm to do most work, he feels he will be able to manage at home within the post-op restrictions.  Is up/around very little, and feels like he will be able to do his usual ADLs and stay within the activity restrictions.  HHPT is ordered. Discharge once arrangements are made  Tommye Standard, PA-C

## 2018-04-28 NOTE — Progress Notes (Signed)
Patient left the floor for XRay.

## 2018-04-28 NOTE — Discharge Instructions (Signed)
° ° °  Supplemental Discharge Instructions for  Pacemaker/Defibrillator Patients  Activity No heavy lifting or vigorous activity with your left/right arm for 6 to 8 weeks.  Do not raise your left/right arm above your head for one week.  Gradually raise your affected arm as drawn below.             05/01/18                      05/02/18                   05/03/18                 89/15/19 __  NO DRIVING for   1 week  ; you may begin driving on  0/10/27  .  WOUND CARE - Keep the wound area clean and dry.  Do not get this area wet , no showers until cleared to at your wound check visit . - The tape/steri-strips on your wound will fall off; do not pull them off.  No bandage is needed on the site.  DO  NOT apply any creams, oils, or ointments to the wound area. - If you notice any drainage or discharge from the wound, any swelling or bruising at the site, or you develop a fever > 101? F after you are discharged home, call the office at once.  Special Instructions - You are still able to use cellular telephones; use the ear opposite the side where you have your pacemaker/defibrillator.  Avoid carrying your cellular phone near your device. - When traveling through airports, show security personnel your identification card to avoid being screened in the metal detectors.  Ask the security personnel to use the hand wand. - Avoid arc welding equipment, MRI testing (magnetic resonance imaging), TENS units (transcutaneous nerve stimulators).  Call the office for questions about other devices. - Avoid electrical appliances that are in poor condition or are not properly grounded. - Microwave ovens are safe to be near or to operate.

## 2018-04-28 NOTE — Evaluation (Signed)
Physical Therapy Evaluation Patient Details Name: [name] MRN: [id number] DOB: [birth year] Today's Date: 04/28/2018   History of Present Illness  Pt is a 77 y/o male admitted secondary to second degree heart block. Pt is s/p L sided pacemaker placement. PMH includes COPD, morbid obesity, OSA on CPAP, HTN, and s/p TAVR.   Clinical Impression  Pt s/p surgery above with deficits below. Pt presenting with BLE weakness and decreased balance. Required min to mod A for mobility, and performed forward and backwards steps at EOB X 3 secondary to unsteadiness and fatigue. Pt heavily reliant on UE, despite cues to limit weightbearing on LUE given recent pacemaker placement. Pt and family wanting PT to take pt further, however, required extensive education about safety concerns, inability to maintain precautions, and decreased activity tolerance. Pt adamant about going home despite concerns for mobility. Feel pt would benefit from short term SNF, however, pt adamantly refusing, so recommend HHPT. Educated about use of transport chair at home as well. Will continue to follow acutely to maximize functional mobility independence and safety.     Follow Up Recommendations Supervision/Assistance - 24 hour;Home health PT(pt adamantly refusing SNF)    Equipment Recommendations  None recommended by PT    Recommendations for Other Services OT consult     Precautions / Restrictions Precautions Precautions: ICD/Pacemaker Precaution Comments: Per pt, MD reports pt can bear weight on LUE. Educated about limiting weightbearing on LUE and about pacemaker precautions.  Required Braces or Orthoses: Other Brace/Splint Other Brace/Splint: Wears L knee brace at baseline  Restrictions Weight Bearing Restrictions: Yes Other Position/Activity Restrictions: pacemaker precautions on LUE       Mobility  Bed Mobility Overal bed mobility: Needs Assistance Bed Mobility: Supine to Sit     Supine to sit: Mod assist      General bed mobility comments: Mod A for trunk elevation. Verbal cues to limit weightbearing on LUE.   Transfers Overall transfer level: Needs assistance Equipment used: Bilateral platform walker Transfers: Sit to/from Stand Sit to Stand: Min assist;From elevated surface;Min guard         General transfer comment: Sit<>Stand X3. Pt requiring increased time and multiple attempts to stand. Verbal cues to limit weightbearing on LUE, however, pt not following cues. Once standing, pt heavily reliant on UE, despite cues to limit weightbearing on LUE. Pt required extremely elevated bed height to stand. Requesting PT not to assist during transfer, however, pt very unsteady and with increased difficulty.   Ambulation/Gait Ambulation/Gait assistance: Min assist;Min guard Gait Distance (Feet): 12 Feet(4' X3) Assistive device: Bilateral platform walker Gait Pattern/deviations: Step-through pattern;Decreased stride length Gait velocity: decreased    General Gait Details: Slow, very unsteady gait. Pt with bilat knee weakness and with shaking in knees. Pt heavily reliant on UE support. Required seated rest in between gait trails secondary to LE weakness. Pt insistent that PT take pt further, however, had to educate extensively about safety concerns, not having +2 help, and limitations secondary to pt not being able to maintain LUE weightbearing precautions. Educated about using transport chair at home to increase safety.   Stairs            Wheelchair Mobility    Modified Rankin (Stroke Patients Only)       Balance Overall balance assessment: Needs assistance Sitting-balance support: No upper extremity supported;Feet supported Sitting balance-Leahy Scale: Fair     Standing balance support: Bilateral upper extremity supported;During functional activity Standing balance-Leahy Scale: Poor Standing balance comment: Heavily reliant  on UE support.                               Pertinent Vitals/Pain Pain Assessment: No/denies pain    Home Living Family/patient expects to be discharged to:: Private residence Living Arrangements: Spouse/significant other;Children Available Help at Discharge: Family;Available 24 hours/day Type of Home: House Home Access: Other (comment)(chair lift )     Home Layout: Two level;Able to live on main level with bedroom/bathroom Home Equipment: Gilford Rile - 2 wheels;Other (comment);Transport chair;Electric scooter(bilat platforms; lift chair )      Prior Function Level of Independence: Needs assistance   Gait / Transfers Assistance Needed: uses bilat plaform RW for ambulation in the house and electric scooter for mobility outside of house.   ADL's / Homemaking Assistance Needed: Wife assists with ADLs.         Hand Dominance        Extremity/Trunk Assessment   Upper Extremity Assessment Upper Extremity Assessment: Defer to OT evaluation(limited bilat shoulder ROM at baseline. )    Lower Extremity Assessment Lower Extremity Assessment: Generalized weakness;LLE deficits/detail LLE Deficits / Details: Knee weakness at baseline and requires use of knee brace at baseline.     Cervical / Trunk Assessment Cervical / Trunk Assessment: Kyphotic  Communication   Communication: No difficulties  Cognition Arousal/Alertness: Awake/alert Behavior During Therapy: WFL for tasks assessed/performed Overall Cognitive Status: Within Functional Limits for tasks assessed                                        General Comments General comments (skin integrity, edema, etc.): Pt's wife present during session. Extensive education provided about safety concerns with mobility at home, use of transport chair vs. bilat platform walker, etc. Pt adamantly refusing SNF and wanting to go home with HHPT.     Exercises     Assessment/Plan    PT Assessment Patient needs continued PT services  PT Problem List Decreased  strength;Decreased balance;Decreased activity tolerance;Decreased mobility;Decreased knowledge of use of DME;Decreased knowledge of precautions       PT Treatment Interventions DME instruction;Gait training;Functional mobility training;Therapeutic activities;Therapeutic exercise;Balance training;Patient/family education    PT Goals (Current goals can be found in the Care Plan section)  Acute Rehab PT Goals Patient Stated Goal: "to go home"  PT Goal Formulation: With patient Time For Goal Achievement: 05/12/18 Potential to Achieve Goals: Fair    Frequency Min 3X/week   Barriers to discharge Other (comment) Wife unable to physically assist.     Co-evaluation               AM-PAC PT "6 Clicks" Daily Activity  Outcome Measure Difficulty turning over in bed (including adjusting bedclothes, sheets and blankets)?: A Lot Difficulty moving from lying on back to sitting on the side of the bed? : Unable Difficulty sitting down on and standing up from a chair with arms (e.g., wheelchair, bedside commode, etc,.)?: Unable Help needed moving to and from a bed to chair (including a wheelchair)?: A Little Help needed walking in hospital room?: A Lot Help needed climbing 3-5 steps with a railing? : A Lot 6 Click Score: 11    End of Session Equipment Utilized During Treatment: Gait belt Activity Tolerance: Patient limited by fatigue Patient left: in bed;with call bell/phone within reach;with family/visitor present;with bed alarm set(sitting EOB ) Nurse Communication:  Mobility status PT Visit Diagnosis: Unsteadiness on feet (R26.81);Muscle weakness (generalized) (M62.81);Other abnormalities of gait and mobility (R26.89);Difficulty in walking, not elsewhere classified (R26.2)    Time: 4037-5436 PT Time Calculation (min) (ACUTE ONLY): 54 min   Charges:   PT Evaluation $PT Eval Moderate Complexity: 1 Mod PT Treatments $Therapeutic Activity: 8-22 mins $Self Care/Home Management: 23-37         Leighton Ruff, PT, DPT  Acute Rehabilitation Services  Pager: [phone]   Rudean Hitt 04/28/2018, 2:26 PM

## 2018-04-28 NOTE — Care Management Note (Signed)
Case Management Note  Patient Details  Name: [name] MRN: [id number] Date of Birth: 1941-01-17  Subjective/Objective:   For discharge today, NCM received call from RN stating patient is refusing to go to snf and would like to go home with HHPT, states patient informed her he would like [facility] , he has had them in the past and he would like to have the same person he had before Joey.Referral made to [facility] with [facility].                    Action/Plan: DC home with [facility] services.   Expected Discharge Date:  04/28/18               Expected Discharge Plan:  [place]  In-House Referral:     Discharge planning Services  CM Consult  Post Acute Care Choice:  Home Health Choice offered to:  Patient  DME Arranged:    DME Agency:     HH Arranged:  PT [place]:  [facility] [employer]  Status of Service:  Completed, signed off  If discussed at [employer], dates discussed:    Additional Comments:  Zenon Mayo, RN 04/28/2018, 2:13 PM

## 2018-05-01 NOTE — Consult Note (Signed)
            [facility] CM Primary Care Navigator  05/01/2018  [name] 09-09-1941 [id number]   Attempt to see patient at the bedside to identify possible discharge needs buthe wasalreadydischargeper staff. Per MD note, patient was discharged home with home health services (refused skilled nursing facility).  Per chart review,patienthad felt weak, dizzy and bradycardic that resulted to this admission.  (Advanced heart block, symptomatic bradycardia)  Primary care provider's office is listed as providing transition of care (TOC) follow-up.  Patient also has discharge instruction to follow-up with cardiology (Dr. Curt Bears) post hospitalization.   For additional questions please contact:  Edwena Felty A. Roe Wilner, BSN, RN-BC [facility] PRIMARY CARE Navigator Cell: [phone]

## 2018-05-09 ENCOUNTER — Ambulatory Visit: Payer: PPO

## 2018-05-10 ENCOUNTER — Ambulatory Visit (INDEPENDENT_AMBULATORY_CARE_PROVIDER_SITE_OTHER): Payer: PPO | Admitting: *Deleted

## 2018-05-10 DIAGNOSIS — I441 Atrioventricular block, second degree: Secondary | ICD-10-CM | POA: Diagnosis not present

## 2018-05-10 DIAGNOSIS — Z950 Presence of cardiac pacemaker: Secondary | ICD-10-CM | POA: Diagnosis not present

## 2018-05-10 LAB — CUP PACEART INCLINIC DEVICE CHECK
Battery Remaining Longevity: 130 mo
Battery Voltage: 3.11 V
Brady Statistic RA Percent Paced: 38 %
Brady Statistic RV Percent Paced: 99.17 %
Date Time Interrogation Session: 20190821163147
Implantable Lead Implant Date: 20190808
Implantable Lead Implant Date: 20190808
Implantable Lead Location: 753859
Implantable Lead Location: 753860
Implantable Pulse Generator Implant Date: 20190808
Lead Channel Impedance Value: 450 Ohm
Lead Channel Impedance Value: 600 Ohm
Lead Channel Pacing Threshold Amplitude: 0.5 V
Lead Channel Pacing Threshold Amplitude: 0.625 V
Lead Channel Pacing Threshold Pulse Width: 0.5 ms
Lead Channel Pacing Threshold Pulse Width: 0.5 ms
Lead Channel Sensing Intrinsic Amplitude: 1.2 mV
Lead Channel Sensing Intrinsic Amplitude: 12 mV
Lead Channel Setting Pacing Amplitude: 0.625
Lead Channel Setting Pacing Amplitude: 1.625
Lead Channel Setting Pacing Pulse Width: 0.5 ms
Lead Channel Setting Sensing Sensitivity: 2.5 mV
Pulse Gen Model: 2272
Pulse Gen Serial Number: 9052893

## 2018-05-10 NOTE — Progress Notes (Addendum)
Wound check appointment. Steri-strips removed. Wound without redness, slight edema noted, soft to palpation. Healing greenish-yellow ecchymosis noted inferior to device site. Incision edges approximated, wound well healed. Normal device function. RV threshold, sensing, and impedances consistent with implant measurements. RA impedance and threshold stable, P-wave measurements variable, results in clinic today measured 0.35mV-4.1mV, trend results 1.2-3.74mV, RA sensitivity increased to 0.26mV. Will review with Dr. Curt Bears for any additional recommendations. Auto capture programmed on for extra safety margin until 3 month visit. Histogram distribution appropriate for patient and level of activity. No mode switches or high ventricular rates noted. Patient educated about wound care, arm mobility, lifting restrictions, and Merlin monitor. Encouraged patient to follow PT's recommendations for safe transfers while following post-implant LUE restriction for 6 weeks post-implant. Patient reports that he was dismissed from [facility] as he was "cleared" at home on the first visit. ROV with WC on 08/01/18.

## 2018-05-15 ENCOUNTER — Ambulatory Visit (INDEPENDENT_AMBULATORY_CARE_PROVIDER_SITE_OTHER): Payer: PPO | Admitting: Bariatrics

## 2018-05-15 VITALS — BP 149/69 | HR 65 | Temp 97.5°F | Ht 74.0 in | Wt 358.0 lb

## 2018-05-15 DIAGNOSIS — Z6841 Body Mass Index (BMI) 40.0 and over, adult: Secondary | ICD-10-CM

## 2018-05-15 DIAGNOSIS — I10 Essential (primary) hypertension: Secondary | ICD-10-CM

## 2018-05-15 DIAGNOSIS — R7303 Prediabetes: Secondary | ICD-10-CM

## 2018-05-16 DIAGNOSIS — G4733 Obstructive sleep apnea (adult) (pediatric): Secondary | ICD-10-CM | POA: Diagnosis not present

## 2018-05-16 DIAGNOSIS — R0602 Shortness of breath: Secondary | ICD-10-CM | POA: Diagnosis not present

## 2018-05-16 NOTE — Progress Notes (Signed)
Office: [phone]  /  Fax: [phone]   HPI:   Chief Complaint: [place] is here to discuss his progress with his obesity treatment plan. He is on the Category 3 plan and is following his eating plan approximately 50 % of the time. He states he is exercising 0 minutes 0 times per week. [name] was recently in the hospital for 3 days, and had a pacemaker placed. His total weight loss is approximately 27 lbs. He has lost 16 lbs since his last visit approximately 1 month ago. His blood pressure has decreased and he is not on medications at this time. He notes he has not been following his plan well.  His weight is (!) 358 lb (162.4 kg) today and has had a weight loss of 16 pounds over a period of 5 weeks since his last visit. He has lost 27 lbs since starting treatment with [name].  Hypertension [name] is a 77 y.o. male with hypertension. [name]'s blood pressure is reasonably well controlled today. His lisinopril has been discontinued and he denies hypotensive symptoms today. He is working weight loss to help control his blood pressure with the goal of decreasing his risk of heart attack and stroke.   [name] has a diagnosis of pre-diabetes based on his elevated Hgb A1c and was informed this puts him at greater risk of developing diabetes. He is not taking metformin currently and continues to work on diet and exercise to decrease risk of diabetes. He denies nausea or hypoglycemia.  ALLERGIES: Allergies  Allergen Reactions  . Coumadin [Warfarin Sodium] Other (See Comments)    States he can't be on this-bleeds out    MEDICATIONS: Current Outpatient Medications on File Prior to Visit  Medication Sig Dispense Refill  . acetaminophen (TYLENOL) 500 MG tablet Take 1,000 mg by mouth 2 (two) times daily.     [name] allopurinol (ZYLOPRIM) 300 MG tablet Take 1 tablet (300 mg total) by mouth daily. 90 tablet 1  . aspirin 81 MG EC tablet TAKE 1 TABLET (81 MG TOTAL) BY MOUTH DAILY. 30  tablet 11  . atorvastatin (LIPITOR) 40 MG tablet Take 1 and a half tablet daily by mouth  (60mg ) 135 tablet 0  . cholecalciferol (VITAMIN D) 1000 UNITS tablet Take 2,000 Units by mouth daily.     . clopidogrel (PLAVIX) 75 MG tablet TAKE 1 TABLET (75 MG TOTAL) BY MOUTH DAILY WITH BREAKFAST. 90 tablet 0  . Coenzyme Q10 (COQ10) 200 MG CAPS Take 200 mg by mouth daily.    . furosemide (LASIX) 20 MG tablet TAKE 1 TABLET BY MOUTH TWICE A DAY 180 tablet 2  . gabapentin (NEURONTIN) 300 MG capsule Take 1 capsule (300 mg total) by mouth 3 (three) times daily. 270 capsule 1  . HEMATINIC/FOLIC ACID 244-0 MG TABS Take 1 tablet by mouth daily.  3  . Misc Natural Products (TART CHERRY ADVANCED PO) Take 1 capsule by mouth daily.    . Multiple Vitamin (MULTIVITAMIN WITH MINERALS) TABS Take 1 tablet by mouth daily.    . polyethylene glycol (MIRALAX / GLYCOLAX) packet Take 17 g by mouth daily.    . sennosides-docusate sodium (SENOKOT-S) 8.6-50 MG tablet Take 1 tablet by mouth 2 (two) times daily.     No current facility-administered medications on file prior to visit.     PAST MEDICAL HISTORY: Past Medical History:  Diagnosis Date  . Anemia   . Aortic stenosis   . Aortic stenosis, severe    S/p  Edwards Sapien 3 Transcatheter Heart Valve (size 26 mm, model # [id number], serial # [id number])  . Arthritis   . Back pain   . Bursitis   . Cataract    left immature  . Cellulitis 10/12/2015  . Complication of anesthesia    Halucinations  . Constipation    takes Miralax daily as well as Senokot daily  . DDD (degenerative disc disease)   . Gout    takes Allopurinol daily  . Heart valve disorder   . History of blood clots 1962   knee  . History of blood transfusion    no abnormal reaction noted  . History of shingles   . Hyperlipidemia    takes Atorvastatin daily  . Hypertension    takes Lisinopril daily  . Joint pain   . Low back pain 01/25/2017  . Neck pain on left side 01/25/2017  . OSA (obstructive  sleep apnea)   . Osteoarthritis   . Peripheral edema    takes Furosemide daily  . Peripheral neuropathy    takes Gabapentin daily  . Peroneal palsy    significant right foot drop  . Pneumonia 25+yrs ago   hx of  . Shortness of breath   . Swelling of extremity   . Urinary frequency   . Urinary urgency   . Valvular heart disease     PAST SURGICAL HISTORY: Past Surgical History:  Procedure Laterality Date  . CARDIAC CATHETERIZATION N/A 05/02/2015   Procedure: Right/Left Heart Cath and Coronary Angiography;  Surgeon: Burnell Blanks, MD;  Location: [facility];  Service: Cardiovascular;  Laterality: N/A;  . COLONOSCOPY N/A 02/07/2013   Procedure: COLONOSCOPY;  Surgeon: Juanita Craver, MD;  Location: [facility] ENDOSCOPY;  Service: Endoscopy;  Laterality: N/A;  . COLONOSCOPY N/A 02/09/2013   Procedure: COLONOSCOPY;  Surgeon: Beryle Beams, MD;  Location: [place];  Service: Endoscopy;  Laterality: N/A;  . ESOPHAGOGASTRODUODENOSCOPY N/A 02/07/2013   Procedure: ESOPHAGOGASTRODUODENOSCOPY (EGD);  Surgeon: Juanita Craver, MD;  Location: [facility] ENDOSCOPY;  Service: Endoscopy;  Laterality: N/A;  . GIVENS CAPSULE STUDY N/A 02/09/2013   Procedure: GIVENS CAPSULE STUDY;  Surgeon: Beryle Beams, MD;  Location: [place];  Service: Endoscopy;  Laterality: N/A;  . HERNIA REPAIR     umbilical hernia  . KNEE SURGERY Right    multiple knee surgeries due to complication of R TKA  . LAMINOTOMY  9833   c6-t2  . PACEMAKER IMPLANT N/A 04/27/2018   Procedure: PACEMAKER IMPLANT;  Surgeon: Constance Haw, MD;  Location: [place] [facility];  Service: Cardiovascular;  Laterality: N/A;  . SPINE SURGERY    . TEE WITHOUT CARDIOVERSION N/A 03/07/2015   Procedure: TRANSESOPHAGEAL ECHOCARDIOGRAM (TEE);  Surgeon: Josue Hector, MD;  Location[facility];  Service: Cardiovascular;  Laterality: N/A;  ANES TO BRING PROPOFOL PER DOCTOR  . TEE WITHOUT CARDIOVERSION N/A 06/10/2015   Procedure: TRANSESOPHAGEAL  ECHOCARDIOGRAM (TEE);  Surgeon: Burnell Blanks, MD;  Location: [place] [facility];  Service: Open Heart Surgery;  Laterality: N/A;  . TOTAL KNEE ARTHROPLASTY     right  . TRANSCATHETER AORTIC VALVE REPLACEMENT, TRANSFEMORAL Left 06/10/2015   Procedure: TRANSCATHETER AORTIC VALVE REPLACEMENT, TRANSFEMORAL;  Surgeon: Burnell Blanks, MD;  Location: [place];  Service: Open Heart Surgery;  Laterality: Left;    SOCIAL HISTORY: Social History   Tobacco Use  . Smoking status: Former Smoker    Years: 30.00    Types: Pipe    Last attempt to quit: 09/20/2006  Years since quitting: 11.6  . Smokeless tobacco: Never Used  Substance Use Topics  . Alcohol use: No    Alcohol/week: 0.0 standard drinks  . Drug use: No    FAMILY HISTORY: Family History  Problem Relation Age of Onset  . Arthritis Sister        "crippling"  . Arthritis Sister     ROS: Review of Systems  Constitutional: Positive for weight loss.  Endo/Heme/Allergies:       Negative hypoglycemia    PHYSICAL EXAM: Blood pressure (!) 149/69, pulse 65, temperature (!) 97.5 F (36.4 C), temperature source Oral, height 6\' 2"  (1.88 m), weight (!) 358 lb (162.4 kg), SpO2 93 %. Body mass index is 45.96 kg/m. Physical Exam  Constitutional: He is oriented to person, place, and time. He appears well-developed and well-nourished.  Cardiovascular: Normal rate.  Pulmonary/Chest: Effort normal.  Musculoskeletal: Normal range of motion.  Neurological: He is oriented to person, place, and time.  Skin: Skin is warm and dry.  Psychiatric: He has a normal mood and affect. His behavior is normal.  Vitals reviewed.   RECENT LABS AND TESTS: BMET    Component Value Date/Time   NA 140 04/26/2018 0955   NA 138 04/11/2018 1030   K 5.2 (H) 04/26/2018 0955   CL 101 04/26/2018 0955   CO2 27 04/26/2018 0955   GLUCOSE 100 (H) 04/26/2018 0955   BUN 36 (H) 04/26/2018 0955   BUN 66 (H) 04/11/2018 1030   CREATININE 1.24 04/26/2018 1802    CREATININE 1.19 02/28/2015 1548   CALCIUM 9.2 04/26/2018 0955   GFRNONAA 55 (L) 04/26/2018 1802   GFRAA >60 04/26/2018 1802   Lab Results  Component Value Date   HGBA1C 5.5 04/11/2018   HGBA1C 5.5 12/06/2017   HGBA1C 5.8 11/24/2017   HGBA1C 6.0 07/28/2017   HGBA1C 5.7 (H) 06/06/2015   Lab Results  Component Value Date   INSULIN 15.2 04/11/2018   INSULIN 21.5 12/06/2017   CBC    Component Value Date/Time   WBC 7.6 04/26/2018 1802   RBC 3.60 (L) 04/26/2018 1802   HGB 10.7 (L) 04/26/2018 1802   HCT 37.2 (L) 04/26/2018 1802   PLT 124 (L) 04/26/2018 1802   MCV 103.3 (H) 04/26/2018 1802   MCH 29.7 04/26/2018 1802   MCHC 28.8 (L) 04/26/2018 1802   RDW 16.1 (H) 04/26/2018 1802   LYMPHSABS 1.3 07/28/2017 1628   MONOABS 0.5 07/28/2017 1628   EOSABS 0.1 07/28/2017 1628   BASOSABS 0.1 07/28/2017 1628   Iron/TIBC/Ferritin/ %Sat No results found for: IRON, TIBC, FERRITIN, IRONPCTSAT Lipid Panel     Component Value Date/Time   CHOL 110 04/11/2018 1030   TRIG 87 04/11/2018 1030   HDL 32 (L) 04/11/2018 1030   CHOLHDL 4 11/24/2017 1633   VLDL 29.6 11/24/2017 1633   LDLCALC 61 04/11/2018 1030   LDLDIRECT 85.0 07/28/2017 1628   Hepatic Function Panel     Component Value Date/Time   PROT 5.8 (L) 04/26/2018 0955   PROT 5.9 (L) 04/11/2018 1030   ALBUMIN 3.2 (L) 04/26/2018 0955   ALBUMIN 3.5 04/11/2018 1030   AST 27 04/26/2018 0955   ALT 24 04/26/2018 0955   ALKPHOS 72 04/26/2018 0955   BILITOT 0.9 04/26/2018 0955   BILITOT 0.4 04/11/2018 1030   BILIDIR 0.1 02/21/2015 0921   IBILI 0.4 04/19/2014 1418      Component Value Date/Time   TSH 1.71 11/24/2017 1633   TSH 1.30 07/28/2017 1628   TSH 1.18  01/25/2017 1415    ASSESSMENT AND PLAN: Essential hypertension  Prediabetes  Class 3 severe obesity with serious comorbidity and body mass index (BMI) of 45.0 to 49.9 in adult, unspecified obesity type ([place])  PLAN:  Hypertension We discussed sodium restriction, working  on healthy weight loss, and a regular exercise program as the means to achieve improved blood pressure control. [name] agreed with this plan and agreed to follow up as directed. We will continue to monitor his blood pressure as well as his progress with the above lifestyle modifications. [name] will not start back on his blood pressure medications at this time. He will watch for signs of hypotension as he continues his lifestyle modifications. [name] agrees to follow up with our clinic in 3 weeks with Dr. Adair Patter.  [name] will continue to work on weight loss, exercise, and decreasing simple carbohydrates in his diet to help decrease the risk of diabetes. We dicussed metformin including benefits and risks. He was informed that eating too many simple carbohydrates or too many calories at one sitting increases the likelihood of GI side effects. [name] does not want to begin metformin, he wants to continue to work on diet and (decrease carbohydrates). [name] agrees to follow up with our clinic in 3 weeks with Dr. Adair Patter as directed to monitor his progress.  Obesity [name] is currently in the action stage of change. As such, his goal is to continue with weight loss efforts He has agreed to follow the Category 3 [place] has been instructed to work up to a goal of 150 minutes of combined cardio and strengthening exercise per week or as is, he is in a motorized wheelchair for weight loss and overall health benefits. We discussed the following Behavioral Modification Strategies today: increasing lean protein intake, decreasing simple carbohydrates, no skipping meals, and better snacking choices  [name] will get back on track following his Category 3 plan at least 80-90 % and minimize carbohydrates.  [name] has agreed to follow up with our clinic in 3 weeks with Dr. Adair Patter. He was informed of the importance of frequent follow up visits to maximize his success with intensive lifestyle modifications for  his multiple health conditions.   OBESITY BEHAVIORAL INTERVENTION VISIT  Today's visit was # 8   Starting weight: 385 lbs Starting date: 12/06/17 Today's weight : 358 lbs  Today's date: 05/15/2018 Total lbs lost to date: 27 At least 15 minutes were spent on discussing the following behavioral intervention visit.   ASK: We discussed the diagnosis of obesity with [place] agreed to give [name] permission to discuss obesity behavioral modification therapy today.  ASSESS: [place] has the diagnosis of obesity and his BMI today is 45.94 [place] is in the action stage of change   ADVISE: [place] was educated on the multiple health risks of obesity as well as the benefit of weight loss to improve his health. He was advised of the need for long term treatment and the importance of lifestyle modifications to improve his current health and to decrease his risk of future health problems.  AGREE: Multiple dietary modification options and treatment options were discussed and  [place] agreed to follow the recommendations documented in the above note.  ARRANGE: [place] was educated on the importance of frequent visits to treat obesity as outlined per CMS and USPSTF guidelines and agreed to schedule his next follow up appointment today.  Wilhemena Durie, am acting as transcriptionist for [employer], DO  I have  reviewed the above documentation for accuracy and completeness, and I agree with the above. -Jearld Lesch, DO

## 2018-05-17 DIAGNOSIS — G629 Polyneuropathy, unspecified: Secondary | ICD-10-CM | POA: Diagnosis not present

## 2018-05-17 DIAGNOSIS — S81812A Laceration without foreign body, left lower leg, initial encounter: Secondary | ICD-10-CM | POA: Diagnosis not present

## 2018-05-17 DIAGNOSIS — I890 Lymphedema, not elsewhere classified: Secondary | ICD-10-CM | POA: Diagnosis not present

## 2018-05-17 DIAGNOSIS — Z09 Encounter for follow-up examination after completed treatment for conditions other than malignant neoplasm: Secondary | ICD-10-CM | POA: Diagnosis not present

## 2018-05-17 DIAGNOSIS — G4730 Sleep apnea, unspecified: Secondary | ICD-10-CM | POA: Diagnosis not present

## 2018-05-17 DIAGNOSIS — Z872 Personal history of diseases of the skin and subcutaneous tissue: Secondary | ICD-10-CM | POA: Diagnosis not present

## 2018-05-17 DIAGNOSIS — Z952 Presence of prosthetic heart valve: Secondary | ICD-10-CM | POA: Diagnosis not present

## 2018-05-17 DIAGNOSIS — I10 Essential (primary) hypertension: Secondary | ICD-10-CM | POA: Diagnosis not present

## 2018-05-22 DIAGNOSIS — G4733 Obstructive sleep apnea (adult) (pediatric): Secondary | ICD-10-CM | POA: Diagnosis not present

## 2018-05-22 DIAGNOSIS — J449 Chronic obstructive pulmonary disease, unspecified: Secondary | ICD-10-CM | POA: Diagnosis not present

## 2018-05-22 DIAGNOSIS — J9622 Acute and chronic respiratory failure with hypercapnia: Secondary | ICD-10-CM | POA: Diagnosis not present

## 2018-05-22 DIAGNOSIS — R0602 Shortness of breath: Secondary | ICD-10-CM | POA: Diagnosis not present

## 2018-05-23 ENCOUNTER — Telehealth: Payer: Self-pay | Admitting: Family Medicine

## 2018-05-23 MED ORDER — ATORVASTATIN CALCIUM 40 MG PO TABS: ORAL_TABLET | ORAL | 5 refills | Status: DC

## 2018-05-23 MED ORDER — GABAPENTIN 300 MG PO CAPS: 300.0000 mg | ORAL_CAPSULE | Freq: Three times a day (TID) | ORAL | 2 refills | Status: DC

## 2018-05-23 NOTE — Telephone Encounter (Signed)
Copied from [place] [id number]. Topic: Quick Communication - Rx Refill/Question >> May 23, 2018 11:26 AM Cecelia Byars, NT wrote: Medication:  atorvastatin (LIPITOR) 40 MG tablet and also gabapentin (NEURONTIN) 300 MG capsule  Has the patient contacted their pharmacy? yes (Agent: If no, request that the patient contact the pharmacy for the refill (Agent: If yes, when and what did the pharmacy advise? Pharmacy told patient they were getting no response from the practice twice , since last Thursday , patient is out of medication  Preferred Pharmacy (with phone number or street name [id number] - [place], [place] - [address] [name] [address] [name] [phone] (Phone) [phone] (Fax)    Agent: Please be advised that RX refills may take up to 3 business days. We ask that you follow-up with your pharmacy.

## 2018-05-23 NOTE — Telephone Encounter (Signed)
Refill sent in to pharmacy 

## 2018-05-30 ENCOUNTER — Ambulatory Visit (INDEPENDENT_AMBULATORY_CARE_PROVIDER_SITE_OTHER): Payer: PPO | Admitting: Family Medicine

## 2018-05-30 VITALS — BP 131/77 | HR 70 | Ht 74.0 in | Wt 358.0 lb

## 2018-05-30 DIAGNOSIS — Z6841 Body Mass Index (BMI) 40.0 and over, adult: Secondary | ICD-10-CM | POA: Diagnosis not present

## 2018-05-30 DIAGNOSIS — I10 Essential (primary) hypertension: Secondary | ICD-10-CM | POA: Diagnosis not present

## 2018-05-30 NOTE — Progress Notes (Signed)
Office: [phone]  /  Fax: [phone]   HPI:   Chief Complaint: [name] is here to discuss his progress with his obesity treatment plan. He is on the Category 3 plan and is following his eating plan approximately 50 % of the time. He states he is exercising 0 minutes 0 times per week. [name] has done well with maintaining his weight. His brother died recently and he is going to be traveling and increasing comfort eating. His weight is (!) 358 lb (162.4 kg) today and he has maintained weight over a period of 2 weeks since his last visit. He has lost 27 lbs since starting treatment with [name].  Hypertension [name] is a 77 y.o. male with hypertension. His blood pressure is stable on medications. [name] denies chest pain or lightheadedness. He is working on diet and weight loss to help control his blood pressure with the goal of decreasing his risk of heart attack and stroke.   ALLERGIES: Allergies  Allergen Reactions   Coumadin [Warfarin Sodium] Other (See Comments)    States he can't be on this-bleeds out    MEDICATIONS: Current Outpatient Medications on File Prior to Visit  Medication Sig Dispense Refill   acetaminophen (TYLENOL) 500 MG tablet Take 1,000 mg by mouth 2 (two) times daily.      allopurinol (ZYLOPRIM) 300 MG tablet Take 1 tablet (300 mg total) by mouth daily. 90 tablet 1   aspirin 81 MG EC tablet TAKE 1 TABLET (81 MG TOTAL) BY MOUTH DAILY. 30 tablet 11   atorvastatin (LIPITOR) 40 MG tablet Take 1 and a half tablet daily by mouth  (60mg ) 135 tablet 5   cholecalciferol (VITAMIN D) 1000 UNITS tablet Take 2,000 Units by mouth daily.      clopidogrel (PLAVIX) 75 MG tablet TAKE 1 TABLET (75 MG TOTAL) BY MOUTH DAILY WITH BREAKFAST. 90 tablet 0   Coenzyme Q10 (COQ10) 200 MG CAPS Take 200 mg by mouth daily.     furosemide (LASIX) 20 MG tablet TAKE 1 TABLET BY MOUTH TWICE A DAY 180 tablet 2   gabapentin (NEURONTIN) 300 MG capsule Take 1 capsule (300  mg total) by mouth 3 (three) times daily. 270 capsule 2   HEMATINIC/FOLIC ACID 801-6 MG TABS Take 1 tablet by mouth daily.  3   Misc Natural Products (TART CHERRY ADVANCED PO) Take 1 capsule by mouth daily.     Multiple Vitamin (MULTIVITAMIN WITH MINERALS) TABS Take 1 tablet by mouth daily.     polyethylene glycol (MIRALAX / GLYCOLAX) packet Take 17 g by mouth daily.     sennosides-docusate sodium (SENOKOT-S) 8.6-50 MG tablet Take 1 tablet by mouth 2 (two) times daily.     No current facility-administered medications on file prior to visit.     PAST MEDICAL HISTORY: Past Medical History:  Diagnosis Date   Anemia    Aortic stenosis    Aortic stenosis, severe    S/p Edwards Sapien 3 Transcatheter Heart Valve (size 26 mm, model # [id number], serial # [id number])   Arthritis    Back pain    Bursitis    Cataract    left immature   Cellulitis 5/53/7482   Complication of anesthesia    Halucinations   Constipation    takes Miralax daily as well as Senokot daily   DDD (degenerative disc disease)    Gout    takes Allopurinol daily   Heart valve disorder    History of blood clots 1962  knee   History of blood transfusion    no abnormal reaction noted   History of shingles    Hyperlipidemia    takes Atorvastatin daily   Hypertension    takes Lisinopril daily   Joint pain    Low back pain 01/25/2017   Neck pain on left side 01/25/2017   OSA (obstructive sleep apnea)    Osteoarthritis    Peripheral edema    takes Furosemide daily   Peripheral neuropathy    takes Gabapentin daily   Peroneal palsy    significant right foot drop   Pneumonia 25+yrs ago   hx of   Shortness of breath    Swelling of extremity    Urinary frequency    Urinary urgency    Valvular heart disease     PAST SURGICAL HISTORY: Past Surgical History:  Procedure Laterality Date   CARDIAC CATHETERIZATION N/A 05/02/2015   Procedure: Right/Left Heart Cath and Coronary  Angiography;  Surgeon: Burnell Blanks, MD;  Location: [place] [facility];  Service: Cardiovascular;  Laterality: N/A;   COLONOSCOPY N/A 02/07/2013   Procedure: COLONOSCOPY;  Surgeon: Juanita Craver, MD;  Location: [facility] ENDOSCOPY;  Service: Endoscopy;  Laterality: N/A;   COLONOSCOPY N/A 02/09/2013   Procedure: COLONOSCOPY;  Surgeon: Beryle Beams, MD;  Location: [place];  Service: Endoscopy;  Laterality: N/A;   ESOPHAGOGASTRODUODENOSCOPY N/A 02/07/2013   Procedure: ESOPHAGOGASTRODUODENOSCOPY (EGD);  Surgeon: Juanita Craver, MD;  Location: [facility] ENDOSCOPY;  Service: Endoscopy;  Laterality: N/A;   GIVENS CAPSULE STUDY N/A 02/09/2013   Procedure: GIVENS CAPSULE STUDY;  Surgeon: Beryle Beams, MD;  Location: [place];  Service: Endoscopy;  Laterality: N/A;   HERNIA REPAIR     umbilical hernia   KNEE SURGERY Right    multiple knee surgeries due to complication of R TKA   LAMINOTOMY  1193   c6-t2   PACEMAKER IMPLANT N/A 04/27/2018   Procedure: PACEMAKER IMPLANT;  Surgeon: Constance Haw, MD;  Location: [place] [facility];  Service: Cardiovascular;  Laterality: N/A;   SPINE SURGERY     TEE WITHOUT CARDIOVERSION N/A 03/07/2015   Procedure: TRANSESOPHAGEAL ECHOCARDIOGRAM (TEE);  Surgeon: Josue Hector, MD;  Location: [facility] ENDOSCOPY;  Service: Cardiovascular;  Laterality: N/A;  ANES TO BRING PROPOFOL PER DOCTOR   TEE WITHOUT CARDIOVERSION N/A 06/10/2015   Procedure: TRANSESOPHAGEAL ECHOCARDIOGRAM (TEE);  Surgeon: Burnell Blanks, MD;  Location: [place];  Service: Open Heart Surgery;  Laterality: N/A;   TOTAL KNEE ARTHROPLASTY     right   TRANSCATHETER AORTIC VALVE REPLACEMENT, TRANSFEMORAL Left 06/10/2015   Procedure: TRANSCATHETER AORTIC VALVE REPLACEMENT, TRANSFEMORAL;  Surgeon: Burnell Blanks, MD;  Location: [place];  Service: Open Heart Surgery;  Laterality: Left;    SOCIAL HISTORY: Social History   Tobacco Use   Smoking status: Former Smoker    Years: 30.00     Types: Pipe    Last attempt to quit: 09/20/2006    Years since quitting: 11.6   Smokeless tobacco: Never Used  Substance Use Topics   Alcohol use: No    Alcohol/week: 0.0 standard drinks   Drug use: No    FAMILY HISTORY: Family History  Problem Relation Age of Onset   Arthritis Sister        "crippling"   Arthritis Sister     ROS: Review of Systems  Constitutional: Negative for weight loss.  Cardiovascular: Negative for chest pain.  Neurological:       Negative for lightheadedness  PHYSICAL EXAM: Blood pressure 131/77, pulse 70, height 6\' 2"  (1.88 m), weight (!) 358 lb (162.4 kg), SpO2 93 %. Body mass index is 45.96 kg/m. Physical Exam  Constitutional: He is oriented to person, place, and time. He appears well-developed and well-nourished.  Cardiovascular: Normal rate.  Pulmonary/Chest: Effort normal.  Musculoskeletal: Normal range of motion.  Neurological: He is oriented to person, place, and time.  Skin: Skin is warm and dry.  Psychiatric: He has a normal mood and affect. His behavior is normal.  Vitals reviewed.   RECENT LABS AND TESTS: BMET    Component Value Date/Time   NA 140 04/26/2018 0955   NA 138 04/11/2018 1030   K 5.2 (H) 04/26/2018 0955   CL 101 04/26/2018 0955   CO2 27 04/26/2018 0955   GLUCOSE 100 (H) 04/26/2018 0955   BUN 36 (H) 04/26/2018 0955   BUN 66 (H) 04/11/2018 1030   CREATININE 1.24 04/26/2018 1802   CREATININE 1.19 02/28/2015 1548   CALCIUM 9.2 04/26/2018 0955   GFRNONAA 55 (L) 04/26/2018 1802   GFRAA >60 04/26/2018 1802   Lab Results  Component Value Date   HGBA1C 5.5 04/11/2018   HGBA1C 5.5 12/06/2017   HGBA1C 5.8 11/24/2017   HGBA1C 6.0 07/28/2017   HGBA1C 5.7 (H) 06/06/2015   Lab Results  Component Value Date   INSULIN 15.2 04/11/2018   INSULIN 21.5 12/06/2017   CBC    Component Value Date/Time   WBC 7.6 04/26/2018 1802   RBC 3.60 (L) 04/26/2018 1802   HGB 10.7 (L) 04/26/2018 1802   HCT 37.2 (L) 04/26/2018  1802   PLT 124 (L) 04/26/2018 1802   MCV 103.3 (H) 04/26/2018 1802   MCH 29.7 04/26/2018 1802   MCHC 28.8 (L) 04/26/2018 1802   RDW 16.1 (H) 04/26/2018 1802   LYMPHSABS 1.3 07/28/2017 1628   MONOABS 0.5 07/28/2017 1628   EOSABS 0.1 07/28/2017 1628   BASOSABS 0.1 07/28/2017 1628   Iron/TIBC/Ferritin/ %Sat No results found for: IRON, TIBC, FERRITIN, IRONPCTSAT Lipid Panel     Component Value Date/Time   CHOL 110 04/11/2018 1030   TRIG 87 04/11/2018 1030   HDL 32 (L) 04/11/2018 1030   CHOLHDL 4 11/24/2017 1633   VLDL 29.6 11/24/2017 1633   LDLCALC 61 04/11/2018 1030   LDLDIRECT 85.0 07/28/2017 1628   Hepatic Function Panel     Component Value Date/Time   PROT 5.8 (L) 04/26/2018 0955   PROT 5.9 (L) 04/11/2018 1030   ALBUMIN 3.2 (L) 04/26/2018 0955   ALBUMIN 3.5 04/11/2018 1030   AST 27 04/26/2018 0955   ALT 24 04/26/2018 0955   ALKPHOS 72 04/26/2018 0955   BILITOT 0.9 04/26/2018 0955   BILITOT 0.4 04/11/2018 1030   BILIDIR 0.1 02/21/2015 0921   IBILI 0.4 04/19/2014 1418      Component Value Date/Time   TSH 1.71 11/24/2017 1633   TSH 1.30 07/28/2017 1628   TSH 1.18 01/25/2017 1415   Results for SHADOW, SCHEDLER (MRN [id number]) as of 05/30/2018 16:27  Ref. Range 12/06/2017 09:54  Vitamin D, 25-Hydroxy Latest Ref Range: 30.0 - 100.0 ng/mL 47.9   ASSESSMENT AND PLAN: Essential hypertension  Class 3 severe obesity with serious comorbidity and body mass index (BMI) of 45.0 to 49.9 in adult, unspecified obesity type ([place])  PLAN:  Hypertension We discussed sodium restriction, working on healthy weight loss, and a regular exercise program as the means to achieve improved blood pressure control. [name] agreed with this plan and agreed to  follow up as directed. We will recheck blood pressure in 3 weeks and will continue to monitor his blood pressure as well as his progress with the above lifestyle modifications. He will continue his medications as prescribed and will watch for  signs of hypotension as he continues his lifestyle modifications.  I spent > than 50% of the 15 minute visit on counseling as documented in the note.  Obesity [name] is currently in the action stage of change. As such, his goal is to continue with weight loss efforts He has agreed to follow the Category 3 [place] has been instructed to work up to a goal of 150 minutes of combined cardio and strengthening exercise per week for weight loss and overall health benefits. We discussed the following Behavioral Modification Strategies today: travel eating strategies, increasing lean protein intake, decreasing simple carbohydrates , work on meal planning and easy cooking plans, emotional eating strategies and avoiding temptations  [name] has agreed to follow up with our clinic in 3 weeks. He was informed of the importance of frequent follow up visits to maximize his success with intensive lifestyle modifications for his multiple health conditions.   OBESITY BEHAVIORAL INTERVENTION VISIT  Today's visit was # 9   Starting weight: 385 lbs Starting date: 12/06/17 Today's weight : 358 lbs  Today's date: 05/30/2018 Total lbs lost to date: 66   ASK: We discussed the diagnosis of obesity with [name] today and [name] agreed to give [name] permission to discuss obesity behavioral modification therapy today.  ASSESS: [name] has the diagnosis of obesity and his BMI today is 45.94 [name] is in the action stage of change   ADVISE: Coree was educated on the multiple health risks of obesity as well as the benefit of weight loss to improve his health. He was advised of the need for long term treatment and the importance of lifestyle modifications to improve his current health and to decrease his risk of future health problems.  AGREE: Multiple dietary modification options and treatment options were discussed and  [name] agreed to follow the recommendations documented in the above note.  ARRANGE: [name]  was educated on the importance of frequent visits to treat obesity as outlined per CMS and USPSTF guidelines and agreed to schedule his next follow up appointment today.  I, Doreene Nest, am acting as transcriptionist for Dennard Nip, MD  I have reviewed the above documentation for accuracy and completeness, and I agree with the above. -Dennard Nip, MD

## 2018-06-01 ENCOUNTER — Ambulatory Visit: Payer: PPO | Admitting: Sports Medicine

## 2018-06-07 ENCOUNTER — Telehealth: Payer: Self-pay | Admitting: *Deleted

## 2018-06-07 NOTE — Telephone Encounter (Signed)
Spoke with patient. Advised that Dr. Curt Bears reviewed device check from wound check appointment and that he did not recommend any changes at this time. Plan to f/u as scheduled on 08/01/18. Patient verbalizes appreciation and denies questions at this time.

## 2018-06-08 NOTE — Progress Notes (Signed)
Patient ID: [name], male   DOB: 12/17/1940, 77 y.o.   MRN: [id number]    No chief complaint on file.    History of Present Illness: 77 y.o.  male with history of morbid obesity, mild HTN, HLD, gout and severe aortic stenosis who is here today for TAVR  follow up.[name] He underwent TAVR on 06/10/15 with 26 mm Sapien 3 . Left transfemoral approach with cut down by Dr Cyndia Bent He did well following the procedure. Admitted August 2019 with syncope and 2:1 AV clock and received a SJM dual chamber PPM MRI compatible pacer by Dr Curt Bears TTE done 04/27/18 reviewed.  LVEF 60-65% the gradient across the TAVR, valve has increased to 22 mmHg. There is mild paravalvular leak.  LVEF is higher at 60-65%. There is moderate mitral stenosis with a mean gradient of 13 mmHg and MVA around 1.17 cm2. Severe biatrial enlargment is noted.  Doing well able to use walker at home Son rides a motorcycle    Past Medical History:  Diagnosis Date  . Anemia   . Aortic stenosis   . Aortic stenosis, severe    S/p Edwards Sapien 3 Transcatheter Heart Valve (size 26 mm, model # [id number], serial # [id number])  . Arthritis   . Back pain   . Bursitis   . Cataract    left immature  . Cellulitis 10/12/2015  . Complication of anesthesia    Halucinations  . Constipation    takes Miralax daily as well as Senokot daily  . DDD (degenerative disc disease)   . Gout    takes Allopurinol daily  . Heart valve disorder   . History of blood clots 1962   knee  . History of blood transfusion    no abnormal reaction noted  . History of shingles   . Hyperlipidemia    takes Atorvastatin daily  . Hypertension    takes Lisinopril daily  . Joint pain   . Low back pain 01/25/2017  . Neck pain on left side 01/25/2017  . OSA (obstructive sleep apnea)   . Osteoarthritis   . Peripheral edema    takes Furosemide daily  . Peripheral neuropathy    takes Gabapentin daily  . Peroneal palsy    significant right foot drop  . Pneumonia 25+yrs ago     hx of  . Shortness of breath   . Swelling of extremity   . Urinary frequency   . Urinary urgency   . Valvular heart disease     Past Surgical History:  Procedure Laterality Date  . CARDIAC CATHETERIZATION N/A 05/02/2015   Procedure: Right/Left Heart Cath and Coronary Angiography;  Surgeon: Burnell Blanks, MD;  Location: [place] [facility];  Service: Cardiovascular;  Laterality: N/A;  . COLONOSCOPY N/A 02/07/2013   Procedure: COLONOSCOPY;  Surgeon: Juanita Craver, MD;  Location: [facility] ENDOSCOPY;  Service: Endoscopy;  Laterality: N/A;  . COLONOSCOPY N/A 02/09/2013   Procedure: COLONOSCOPY;  Surgeon: Beryle Beams, MD;  Location: [place];  Service: Endoscopy;  Laterality: N/A;  . ESOPHAGOGASTRODUODENOSCOPY N/A 02/07/2013   Procedure: ESOPHAGOGASTRODUODENOSCOPY (EGD);  Surgeon: Juanita Craver, MD;  Location: [facility] ENDOSCOPY;  Service: Endoscopy;  Laterality: N/A;  . GIVENS CAPSULE STUDY N/A 02/09/2013   Procedure: GIVENS CAPSULE STUDY;  Surgeon: Beryle Beams, MD;  Location: [place];  Service: Endoscopy;  Laterality: N/A;  . HERNIA REPAIR     umbilical hernia  . KNEE SURGERY Right    multiple knee surgeries due to complication of R  TKA  . LAMINOTOMY  1245   c6-t2  . PACEMAKER IMPLANT N/A 04/27/2018   Procedure: PACEMAKER IMPLANT;  Surgeon: Constance Haw, MD;  Location: [place] [facility];  Service: Cardiovascular;  Laterality: N/A;  . SPINE SURGERY    . TEE WITHOUT CARDIOVERSION N/A 03/07/2015   Procedure: TRANSESOPHAGEAL ECHOCARDIOGRAM (TEE);  Surgeon: Josue Hector, MD;  Location: [facility] ENDOSCOPY;  Service: Cardiovascular;  Laterality: N/A;  ANES TO BRING PROPOFOL PER DOCTOR  . TEE WITHOUT CARDIOVERSION N/A 06/10/2015   Procedure: TRANSESOPHAGEAL ECHOCARDIOGRAM (TEE);  Surgeon: Burnell Blanks, MD;  Location: [place];  Service: Open Heart Surgery;  Laterality: N/A;  . TOTAL KNEE ARTHROPLASTY     right  . TRANSCATHETER AORTIC VALVE REPLACEMENT, TRANSFEMORAL Left 06/10/2015    Procedure: TRANSCATHETER AORTIC VALVE REPLACEMENT, TRANSFEMORAL;  Surgeon: Burnell Blanks, MD;  Location: [place];  Service: Open Heart Surgery;  Laterality: Left;    Current Outpatient Medications  Medication Sig Dispense Refill  . acetaminophen (TYLENOL) 500 MG tablet Take 1,000 mg by mouth 2 (two) times daily.     [name] allopurinol (ZYLOPRIM) 300 MG tablet Take 1 tablet (300 mg total) by mouth daily. 90 tablet 1  . aspirin 81 MG EC tablet TAKE 1 TABLET (81 MG TOTAL) BY MOUTH DAILY. 30 tablet 11  . atorvastatin (LIPITOR) 40 MG tablet Take 1 and a half tablet daily by mouth  (60mg ) 135 tablet 5  . cholecalciferol (VITAMIN D) 1000 UNITS tablet Take 2,000 Units by mouth daily.     . clopidogrel (PLAVIX) 75 MG tablet TAKE 1 TABLET (75 MG TOTAL) BY MOUTH DAILY WITH BREAKFAST. 90 tablet 0  . Coenzyme Q10 (COQ10) 200 MG CAPS Take 200 mg by mouth daily.    . Ferrous Fumarate-Folic Acid (HEMATINIC/FOLIC ACID) 809-9 MG TABS Take 1 tablet by mouth daily. 90 each 1  . furosemide (LASIX) 20 MG tablet Take 1 tablet (20 mg total) by mouth 2 (two) times daily. Please keep upcoming appt in September for future refills. Thank you 180 tablet 0  . gabapentin (NEURONTIN) 300 MG capsule Take 1 capsule (300 mg total) by mouth 3 (three) times daily. 270 capsule 2  . Misc Natural Products (TART CHERRY ADVANCED PO) Take 1 capsule by mouth daily.    . Multiple Vitamin (MULTIVITAMIN WITH MINERALS) TABS Take 1 tablet by mouth daily.    . polyethylene glycol (MIRALAX / GLYCOLAX) packet Take 17 g by mouth daily.    . sennosides-docusate sodium (SENOKOT-S) 8.6-50 MG tablet Take 1 tablet by mouth 2 (two) times daily.     No current facility-administered medications for this visit.     Allergies  Allergen Reactions  . Coumadin [Warfarin Sodium] Other (See Comments)    States he can't be on this-bleeds out    Social History   Socioeconomic History  . Marital status: Married    Spouse name: [name]  . Number of  children: 3  . Years of education: Not on file  . Highest education level: Not on file  Occupational History  . Occupation: retired Investment banker, operational  . Financial resource strain: Not on file  . Food insecurity:    Worry: Not on file    Inability: Not on file  . Transportation needs:    Medical: Not on file    Non-medical: Not on file  Tobacco Use  . Smoking status: Former Smoker    Years: 30.00    Types: Pipe    Last attempt  to quit: 09/20/2006    Years since quitting: 11.7  . Smokeless tobacco: Never Used  Substance and Sexual Activity  . Alcohol use: No    Alcohol/week: 0.0 standard drinks  . Drug use: No  . Sexual activity: Never    Comment: lives with wife, retired, no dietary restrictions  Lifestyle  . Physical activity:    Days per week: Not on file    Minutes per session: Not on file  . Stress: Not on file  Relationships  . Social connections:    Talks on phone: Not on file    Gets together: Not on file    Attends religious service: Not on file    Active member of club or organization: Not on file    Attends meetings of clubs or organizations: Not on file    Relationship status: Not on file  . Intimate partner violence:    Fear of current or ex partner: Not on file    Emotionally abused: Not on file    Physically abused: Not on file    Forced sexual activity: Not on file  Other Topics Concern  . Not on file  Social History Narrative  . Not on file    Family History  Problem Relation Age of Onset  . Arthritis Sister        "crippling"  . Arthritis Sister     Review of Systems:  As stated in the HPI and otherwise negative.   BP 128/68   Pulse 64   Ht 6\' 2"  (1.88 m)   Wt (!) 358 lb (162.4 kg)   SpO2 96%   BMI 45.96 kg/m   Physical Examination: Affect appropriate Morbidly obese white male  HEENT: normal Neck supple with no adenopathy JVP normal no bruits no thyromegaly Lungs clear with no wheezing and good diaphragmatic  motion Heart:  S1/S2 SEM no AR  murmur, no rub, gallop or click PMI normal pacer under left clavicle  Abdomen: benighn, BS positve, no tenderness, no AAA no bruit.  No HSM or HJR Distal pulses intact with no bruits Plus 3 bilateral LE edema with stasis and ichthyosis Neuro non-focal Skin warm and dry No muscular weakness   EKG:    06/10/16  SR rate 86 RBBB stable   Recent Labs: 11/24/2017: TSH 1.71 04/26/2018: ALT 24; BUN 36; Creatinine, Ser 1.24; Hemoglobin 10.7; Platelets 124; Potassium 5.2; Sodium 140   Lipid Panel    Component Value Date/Time   CHOL 110 04/11/2018 1030   TRIG 87 04/11/2018 1030   HDL 32 (L) 04/11/2018 1030   CHOLHDL 4 11/24/2017 1633   VLDL 29.6 11/24/2017 1633   LDLCALC 61 04/11/2018 1030   LDLDIRECT 85.0 07/28/2017 1628     Wt Readings from Last 3 Encounters:  06/16/18 (!) 358 lb (162.4 kg)  06/14/18 (!) 358 lb (162.4 kg)  05/30/18 (!) 358 lb (162.4 kg)     Other studies Reviewed: Additional studies/ records that were reviewed today include: I have reviewed the echo images from today Review of the above records demonstrates: stable bioprosthetic valve   Assessment and Plan:   1. Severe aortic valve stenosis: s/p TAVR. 26 mm valve 2016   Doing well. NYHA class 2 symptoms. Echo 06/11/16 with good function to valve  He will continue ASA and Plavix. He will need antibiotic prophylaxis before dental or surgical procedures.Repeat TTE in 6 months hope gradients back down after pacer placed   2. Chol:   Cholesterol is at goal.  Continue current dose of statin and diet Rx.  No myalgias or side effects.  F/U  LFT's in 6 months. Lab Results  Component Value Date   LDLCALC 61 04/11/2018   3. OSA:  Sees Dr Annamaria Boots issues with bipap May need dental appliance and bipap.   4. Back pain: good relief with steroid injection f/u pain clinic    5. Pacer:  Normal function f/u with Dr Curt Bears SJM MRI compatible device            Jenkins Rouge

## 2018-06-11 ENCOUNTER — Other Ambulatory Visit: Payer: Self-pay | Admitting: Cardiovascular Disease

## 2018-06-12 ENCOUNTER — Other Ambulatory Visit: Payer: Self-pay | Admitting: Family Medicine

## 2018-06-12 ENCOUNTER — Ambulatory Visit: Payer: PPO | Admitting: Cardiovascular Disease

## 2018-06-14 ENCOUNTER — Ambulatory Visit (INDEPENDENT_AMBULATORY_CARE_PROVIDER_SITE_OTHER): Payer: PPO | Admitting: Sports Medicine

## 2018-06-14 ENCOUNTER — Encounter: Payer: Self-pay | Admitting: Sports Medicine

## 2018-06-14 ENCOUNTER — Ambulatory Visit: Payer: Self-pay

## 2018-06-14 VITALS — BP 130/70 | HR 72 | Ht 74.0 in | Wt 358.0 lb

## 2018-06-14 DIAGNOSIS — G8929 Other chronic pain: Secondary | ICD-10-CM

## 2018-06-14 DIAGNOSIS — M25511 Pain in right shoulder: Secondary | ICD-10-CM | POA: Diagnosis not present

## 2018-06-14 DIAGNOSIS — M25512 Pain in left shoulder: Secondary | ICD-10-CM | POA: Diagnosis not present

## 2018-06-14 NOTE — Procedures (Signed)
PROCEDURE NOTE:  Ultrasound Guided: Injection: Bilateral shoulder Images were obtained and interpreted by myself, Teresa Coombs, DO  Images have been saved and stored to PACS system. Images obtained on: GE S7 Ultrasound machine    ULTRASOUND FINDINGS:  Small amount of glenohumeral spurring.  DESCRIPTION OF PROCEDURE:  The patient's clinical condition is marked by substantial pain and/or significant functional disability. Other conservative therapy has not provided relief, is contraindicated, or not appropriate. There is a reasonable likelihood that injection will significantly improve the patient's pain and/or functional impairment.   After discussing the risks, benefits and expected outcomes of the injection and all questions were reviewed and answered, the patient wished to undergo the above named procedure.  Verbal consent was obtained.  The ultrasound was used to identify the target structure and adjacent neurovascular structures. The skin was then prepped in sterile fashion and the target structure was injected under direct visualization using sterile technique as below:  Bilateral injections performed under identical technique as below: PREP: Alcohol and Ethel Chloride APPROACH:posterior, single injection, 21g 2 in. INJECTATE: 2 cc 0.5% Marcaine and 1 cc 40mg /mL DepoMedrol ASPIRATE: None DRESSING: Band-Aid  Post procedural instructions including recommending icing and warning signs for infection were reviewed.    This procedure was well tolerated and there were no complications.   IMPRESSION: Succesful Ultrasound Guided: Injection

## 2018-06-14 NOTE — Progress Notes (Signed)
[name]. [name], [name] at [place] - 77 y.o. male MRN [id number]  Date of birth: [birth year]  Visit Date: 06/14/2018  PCP: [name], MD   Referred by: [name], MD   Scribe(s) for today's visit: Wendy Poet, LAT, ATC  SUBJECTIVE:  [name] is here for New Patient (Initial Visit) (B shoulder pain)   HPI: His B shoulder pain symptoms INITIALLY: Began around 10-12 years ago prior to having a L TKR.  He went to see Dr. Onnie Graham and he suggested getting a shoulder replacement which he did not pursue at that time.  He recalls getting a steroid injection 1x in each shoulder.  He states that his shoulders will also intermittently sublux.  He reports by the time he was ready to do something about his shoulders and followed-up w/ Dr. Onnie Graham that Dr. Onnie Graham noted that his shoulders were too degenerated and "too far gone" that he could no longer help him from a surgical standpoint.  Pt states that he had B shoulder XRs done at Dr. Susie Cassette office approximately 2 years ago. Described as mod-severe burning, toothache pain , radiating to B upper arms Worsened with nothing specific just general use of the UEs Improved with BenGay. Additional associated symptoms include: + B shld mechanical symptoms; no N/t in B UEs    At this time symptoms are worsening compared to onset. He has been taking Extra Strength Tylenol/day and using BenGay.  He also takes Gabapentin 300mg  tid.  Hx of L TKR - 2006 and R TKR - 2008 Pt has a pacemaker - Aug. 2019   REVIEW OF SYSTEMS: Denies night time disturbances. Denies fevers, chills, or night sweats. Denies unexplained weight loss. Denies personal history of cancer. Denies changes in bowel or bladder habits. Denies recent unreported falls. Denies new or worsening dyspnea or wheezing. Denies headaches or dizziness.  Reports numbness, tingling or weakness  In the  extremities - N/T in B LEs and weakness in UEs and LEs Denies dizziness or presyncopal episodes Denies lower extremity edema    HISTORY:  Prior history reviewed and updated per electronic medical record.  Social History   Occupational History  . Occupation: retired Nurse, mental health  . Smoking status: Former Smoker    Years: 30.00    Types: Pipe    Last attempt to quit: 09/20/2006    Years since quitting: 11.7  . Smokeless tobacco: Never Used  Substance and Sexual Activity  . Alcohol use: No    Alcohol/week: 0.0 standard drinks  . Drug use: No  . Sexual activity: Never    Comment: lives with wife, retired, no dietary restrictions   Social History   Social History Narrative  . Not on file    Past Medical History:  Diagnosis Date  . Anemia   . Aortic stenosis   . Aortic stenosis, severe    S/p Edwards Sapien 3 Transcatheter Heart Valve (size 26 mm, model # [id number], serial # [id number])  . Arthritis   . Back pain   . Bursitis   . Cataract    left immature  . Cellulitis 10/12/2015  . Complication of anesthesia    Halucinations  . Constipation    takes Miralax daily as well as Senokot daily  . DDD (degenerative disc disease)   . Gout    takes Allopurinol daily  . Heart valve disorder   . History of  blood clots 1962   knee  . History of blood transfusion    no abnormal reaction noted  . History of shingles   . Hyperlipidemia    takes Atorvastatin daily  . Hypertension    takes Lisinopril daily  . Joint pain   . Low back pain 01/25/2017  . Neck pain on left side 01/25/2017  . OSA (obstructive sleep apnea)   . Osteoarthritis   . Peripheral edema    takes Furosemide daily  . Peripheral neuropathy    takes Gabapentin daily  . Peroneal palsy    significant right foot drop  . Pneumonia 25+yrs ago   hx of  . Shortness of breath   . Swelling of extremity   . Urinary frequency   . Urinary urgency   . Valvular heart disease    Past Surgical History:    Procedure Laterality Date  . CARDIAC CATHETERIZATION N/A 05/02/2015   Procedure: Right/Left Heart Cath and Coronary Angiography;  Surgeon: Burnell Blanks, MD;  Location: [place] [facility];  Service: Cardiovascular;  Laterality: N/A;  . COLONOSCOPY N/A 02/07/2013   Procedure: COLONOSCOPY;  Surgeon: Juanita Craver, MD;  Location[facility] ENDOSCOPY;  Service: Endoscopy;  Laterality: N/A;  . COLONOSCOPY N/A 02/09/2013   Procedure: COLONOSCOPY;  Surgeon: Beryle Beams, MD;  Location: [place];  Service: Endoscopy;  Laterality: N/A;  . ESOPHAGOGASTRODUODENOSCOPY N/A 02/07/2013   Procedure: ESOPHAGOGASTRODUODENOSCOPY (EGD);  Surgeon: Juanita Craver, MD;  Location: [facility] ENDOSCOPY;  Service: Endoscopy;  Laterality: N/A;  . GIVENS CAPSULE STUDY N/A 02/09/2013   Procedure: GIVENS CAPSULE STUDY;  Surgeon: Beryle Beams, MD;  Location: [place];  Service: Endoscopy;  Laterality: N/A;  . HERNIA REPAIR     umbilical hernia  . KNEE SURGERY Right    multiple knee surgeries due to complication of R TKA  . LAMINOTOMY  0626   c6-t2  . PACEMAKER IMPLANT N/A 04/27/2018   Procedure: PACEMAKER IMPLANT;  Surgeon: Constance Haw, MD;  Location: [place] [facility];  Service: Cardiovascular;  Laterality: N/A;  . SPINE SURGERY    . TEE WITHOUT CARDIOVERSION N/A 03/07/2015   Procedure: TRANSESOPHAGEAL ECHOCARDIOGRAM (TEE);  Surgeon: Josue Hector, MD;  Location: [facility] ENDOSCOPY;  Service: Cardiovascular;  Laterality: N/A;  ANES TO BRING PROPOFOL PER DOCTOR  . TEE WITHOUT CARDIOVERSION N/A 06/10/2015   Procedure: TRANSESOPHAGEAL ECHOCARDIOGRAM (TEE);  Surgeon: Burnell Blanks, MD;  Location: [place];  Service: Open Heart Surgery;  Laterality: N/A;  . TOTAL KNEE ARTHROPLASTY     right  . TRANSCATHETER AORTIC VALVE REPLACEMENT, TRANSFEMORAL Left 06/10/2015   Procedure: TRANSCATHETER AORTIC VALVE REPLACEMENT, TRANSFEMORAL;  Surgeon: Burnell Blanks, MD;  Location: [place];  Service: Open Heart Surgery;   Laterality: Left;   family history includes Arthritis in his sister and sister.  DATA OBTAINED & REVIEWED:   Recent Labs    07/28/17 1628 11/24/17 1633 12/06/17 0954 04/11/18 1030  HGBA1C 6.0 5.8 5.5 5.5  LABURIC 6.6 6.1  --   --    .   OBJECTIVE:  VS:  HT:6\' 2"  (188 cm)   WT:(!) 358 lb (162.4 kg)  BMI:45.94    BP:130/70  HR:72bpm  TEMP: ( )  RESP:91 %   PHYSICAL EXAM: CONSTITUTIONAL: Well-developed, Well-nourished and In no acute distress PSYCHIATRIC: Alert & appropriately interactive. and Not depressed or anxious appearing. RESPIRATORY: No increased work of breathing and Trachea Midline EYES: Pupils are equal., EOM intact without nystagmus. and No scleral icterus.  VASCULAR EXAM:  Warm and well perfused NEURO: unremarkable  MSK Exam: Bilateral shoulders  Well aligned, no significant deformity. No overlying skin changes. No focal bony tenderness   RANGE OF MOTION & STRENGTH  Limited overhead range of motion by 10 to 15 degrees in all planes.  Strength with rotator cuff testing is intact.   SPECIALITY TESTING:  Mild pain with axial loading circumduction localizing to the glenohumeral joint.  No focal pain over the [facility] joint     ASSESSMENT   1. Chronic pain of both shoulders     PLAN:  Pertinent additional documentation may be included in corresponding procedure notes, imaging studies, problem based documentation and patient instructions.  Procedures:  . US Guided Injection per procedure note  Medications:  No orders of the defined types were placed in this encounter.  Discussion/Instructions: No problem-specific Assessment & Plan notes found for this encounter.  [name] He should respond well to intra-articular injections. . Discussed red flag symptoms that warrant earlier emergent evaluation and patient voices understanding. . Activity modifications and the importance of avoiding exacerbating activities (limiting pain to no more than a 4 / 10 during or  following activity) recommended and discussed.  Follow-up:  . Return in about 10 weeks (around 08/23/2018).   . If any lack of improvement consider: Repeat corticosteroid injections and/or referral to physical therapy.      CMA/ATC served as Education administrator during this visit. History, Physical, and Plan performed by medical provider. Documentation and orders reviewed and attested to.      Gerda Diss, [place] [facility] Physician

## 2018-06-14 NOTE — Patient Instructions (Addendum)

## 2018-06-16 ENCOUNTER — Ambulatory Visit (INDEPENDENT_AMBULATORY_CARE_PROVIDER_SITE_OTHER): Payer: PPO | Admitting: Cardiovascular Disease

## 2018-06-16 ENCOUNTER — Encounter: Payer: Self-pay | Admitting: Cardiovascular Disease

## 2018-06-16 VITALS — BP 128/68 | HR 64 | Ht 74.0 in | Wt 358.0 lb

## 2018-06-16 DIAGNOSIS — Z952 Presence of prosthetic heart valve: Secondary | ICD-10-CM | POA: Diagnosis not present

## 2018-06-16 DIAGNOSIS — G4733 Obstructive sleep apnea (adult) (pediatric): Secondary | ICD-10-CM | POA: Diagnosis not present

## 2018-06-16 DIAGNOSIS — R0602 Shortness of breath: Secondary | ICD-10-CM | POA: Diagnosis not present

## 2018-06-16 DIAGNOSIS — Z950 Presence of cardiac pacemaker: Secondary | ICD-10-CM

## 2018-06-16 DIAGNOSIS — E782 Mixed hyperlipidemia: Secondary | ICD-10-CM | POA: Diagnosis not present

## 2018-06-16 DIAGNOSIS — I359 Nonrheumatic aortic valve disorder, unspecified: Secondary | ICD-10-CM

## 2018-06-16 NOTE — Patient Instructions (Addendum)

## 2018-06-20 ENCOUNTER — Ambulatory Visit (INDEPENDENT_AMBULATORY_CARE_PROVIDER_SITE_OTHER): Payer: PPO | Admitting: Family Medicine

## 2018-06-20 VITALS — BP 119/72 | HR 62 | Ht 74.0 in | Wt 349.0 lb

## 2018-06-20 DIAGNOSIS — Z6841 Body Mass Index (BMI) 40.0 and over, adult: Secondary | ICD-10-CM

## 2018-06-20 DIAGNOSIS — E7849 Other hyperlipidemia: Secondary | ICD-10-CM | POA: Diagnosis not present

## 2018-06-21 DIAGNOSIS — G4733 Obstructive sleep apnea (adult) (pediatric): Secondary | ICD-10-CM | POA: Diagnosis not present

## 2018-06-21 DIAGNOSIS — R0602 Shortness of breath: Secondary | ICD-10-CM | POA: Diagnosis not present

## 2018-06-21 DIAGNOSIS — J449 Chronic obstructive pulmonary disease, unspecified: Secondary | ICD-10-CM | POA: Diagnosis not present

## 2018-06-21 DIAGNOSIS — J9622 Acute and chronic respiratory failure with hypercapnia: Secondary | ICD-10-CM | POA: Diagnosis not present

## 2018-06-21 NOTE — Progress Notes (Signed)
Office: [phone]  /  Fax: [phone]   HPI:   Chief Complaint: [name] is here to discuss his progress with his obesity treatment plan. He is on the  follow the Category 3 plan and is following his eating plan approximately 75 % of the time. He states he is walking in the house, down the hall 7 times per week. [name] has done very well with weight loss, even with increased traveling and dealing with his brothers recent death. He did indulge in sweets and baked goods, but he tried to control his portions. His weight is (!) 349 lb (158.3 kg) today and has had a weight loss of 9 pounds over a period of 3 weeks since his last visit. He has lost 36 lbs since starting treatment with [name].  Hyperlipidemia [name] has hyperlipidemia and he is currently on lipitor and he is doing well on his diet prescription. [name] has been trying to improve his cholesterol levels with intensive lifestyle modification including a low saturated fat diet, exercise and weight loss. He denies any chest pain or myalgias. [name] also takes OTC CoQ 10 to help prevent myalgias.  ALLERGIES: Allergies  Allergen Reactions  . Coumadin [Warfarin Sodium] Other (See Comments)    States he can't be on this-bleeds out    MEDICATIONS: Current Outpatient Medications on File Prior to Visit  Medication Sig Dispense Refill  . acetaminophen (TYLENOL) 500 MG tablet Take 1,000 mg by mouth 2 (two) times daily.     [name] allopurinol (ZYLOPRIM) 300 MG tablet Take 1 tablet (300 mg total) by mouth daily. 90 tablet 1  . aspirin 81 MG EC tablet TAKE 1 TABLET (81 MG TOTAL) BY MOUTH DAILY. 30 tablet 11  . atorvastatin (LIPITOR) 40 MG tablet Take 1 and a half tablet daily by mouth  (60mg ) 135 tablet 5  . cholecalciferol (VITAMIN D) 1000 UNITS tablet Take 2,000 Units by mouth daily.     . clopidogrel (PLAVIX) 75 MG tablet TAKE 1 TABLET (75 MG TOTAL) BY MOUTH DAILY WITH BREAKFAST. 90 tablet 0  . Coenzyme Q10 (COQ10) 200 MG CAPS Take 200 mg by  mouth daily.    . Ferrous Fumarate-Folic Acid (HEMATINIC/FOLIC ACID) 195-0 MG TABS Take 1 tablet by mouth daily. 90 each 1  . furosemide (LASIX) 20 MG tablet Take 1 tablet (20 mg total) by mouth 2 (two) times daily. Please keep upcoming appt in September for future refills. Thank you 180 tablet 0  . gabapentin (NEURONTIN) 300 MG capsule Take 1 capsule (300 mg total) by mouth 3 (three) times daily. 270 capsule 2  . Misc Natural Products (TART CHERRY ADVANCED PO) Take 1 capsule by mouth daily.    . Multiple Vitamin (MULTIVITAMIN WITH MINERALS) TABS Take 1 tablet by mouth daily.    . polyethylene glycol (MIRALAX / GLYCOLAX) packet Take 17 g by mouth daily.    . sennosides-docusate sodium (SENOKOT-S) 8.6-50 MG tablet Take 1 tablet by mouth 2 (two) times daily.     No current facility-administered medications on file prior to visit.     PAST MEDICAL HISTORY: Past Medical History:  Diagnosis Date  . Anemia   . Aortic stenosis   . Aortic stenosis, severe    S/p Edwards Sapien 3 Transcatheter Heart Valve (size 26 mm, model # [id number], serial # [id number])  . Arthritis   . Back pain   . Bursitis   . Cataract    left immature  . Cellulitis 10/12/2015  . Complication of anesthesia  Halucinations  . Constipation    takes Miralax daily as well as Senokot daily  . DDD (degenerative disc disease)   . Gout    takes Allopurinol daily  . Heart valve disorder   . History of blood clots 1962   knee  . History of blood transfusion    no abnormal reaction noted  . History of shingles   . Hyperlipidemia    takes Atorvastatin daily  . Hypertension    takes Lisinopril daily  . Joint pain   . Low back pain 01/25/2017  . Neck pain on left side 01/25/2017  . OSA (obstructive sleep apnea)   . Osteoarthritis   . Peripheral edema    takes Furosemide daily  . Peripheral neuropathy    takes Gabapentin daily  . Peroneal palsy    significant right foot drop  . Pneumonia 25+yrs ago   hx of  . Shortness  of breath   . Swelling of extremity   . Urinary frequency   . Urinary urgency   . Valvular heart disease     PAST SURGICAL HISTORY: Past Surgical History:  Procedure Laterality Date  . CARDIAC CATHETERIZATION N/A 05/02/2015   Procedure: Right/Left Heart Cath and Coronary Angiography;  Surgeon: Burnell Blanks, MD;  Location: [place] [facility];  Service: Cardiovascular;  Laterality: N/A;  . COLONOSCOPY N/A 02/07/2013   Procedure: COLONOSCOPY;  Surgeon: Juanita Craver, MD;  Location: [facility] ENDOSCOPY;  Service: Endoscopy;  Laterality: N/A;  . COLONOSCOPY N/A 02/09/2013   Procedure: COLONOSCOPY;  Surgeon: Beryle Beams, MD;  Location: [place];  Service: Endoscopy;  Laterality: N/A;  . ESOPHAGOGASTRODUODENOSCOPY N/A 02/07/2013   Procedure: ESOPHAGOGASTRODUODENOSCOPY (EGD);  Surgeon: Juanita Craver, MD;  Location[facility] ENDOSCOPY;  Service: Endoscopy;  Laterality: N/A;  . GIVENS CAPSULE STUDY N/A 02/09/2013   Procedure: GIVENS CAPSULE STUDY;  Surgeon: Beryle Beams, MD;  Location: [place];  Service: Endoscopy;  Laterality: N/A;  . HERNIA REPAIR     umbilical hernia  . KNEE SURGERY Right    multiple knee surgeries due to complication of R TKA  . LAMINOTOMY  9562   c6-t2  . PACEMAKER IMPLANT N/A 04/27/2018   Procedure: PACEMAKER IMPLANT;  Surgeon: Constance Haw, MD;  Location: [place] [facility];  Service: Cardiovascular;  Laterality: N/A;  . SPINE SURGERY    . TEE WITHOUT CARDIOVERSION N/A 03/07/2015   Procedure: TRANSESOPHAGEAL ECHOCARDIOGRAM (TEE);  Surgeon: Josue Hector, MD;  Location: [facility] ENDOSCOPY;  Service: Cardiovascular;  Laterality: N/A;  ANES TO BRING PROPOFOL PER DOCTOR  . TEE WITHOUT CARDIOVERSION N/A 06/10/2015   Procedure: TRANSESOPHAGEAL ECHOCARDIOGRAM (TEE);  Surgeon: Burnell Blanks, MD;  Location: [place];  Service: Open Heart Surgery;  Laterality: N/A;  . TOTAL KNEE ARTHROPLASTY     right  . TRANSCATHETER AORTIC VALVE REPLACEMENT, TRANSFEMORAL Left 06/10/2015    Procedure: TRANSCATHETER AORTIC VALVE REPLACEMENT, TRANSFEMORAL;  Surgeon: Burnell Blanks, MD;  Location: [place];  Service: Open Heart Surgery;  Laterality: Left;    SOCIAL HISTORY: Social History   Tobacco Use  . Smoking status: Former Smoker    Years: 30.00    Types: Pipe    Last attempt to quit: 09/20/2006    Years since quitting: 11.7  . Smokeless tobacco: Never Used  Substance Use Topics  . Alcohol use: No    Alcohol/week: 0.0 standard drinks  . Drug use: No    FAMILY HISTORY: Family History  Problem Relation Age of Onset  . Arthritis  Sister        "crippling"  . Arthritis Sister     ROS: Review of Systems  Constitutional: Positive for weight loss.  Cardiovascular: Negative for chest pain.  Musculoskeletal: Negative for myalgias.    PHYSICAL EXAM: Blood pressure 119/72, pulse 62, height 6\' 2"  (1.88 m), weight (!) 349 lb (158.3 kg), SpO2 94 %. Body mass index is 44.81 kg/m. Physical Exam  Constitutional: He is oriented to person, place, and time. He appears well-developed and well-nourished.  Cardiovascular: Normal rate.  Pulmonary/Chest: Effort normal.  Musculoskeletal: Normal range of motion.  Neurological: He is oriented to person, place, and time.  Skin: Skin is warm and dry.  Psychiatric: He has a normal mood and affect. His behavior is normal.  Vitals reviewed.   RECENT LABS AND TESTS: BMET    Component Value Date/Time   NA 140 04/26/2018 0955   NA 138 04/11/2018 1030   K 5.2 (H) 04/26/2018 0955   CL 101 04/26/2018 0955   CO2 27 04/26/2018 0955   GLUCOSE 100 (H) 04/26/2018 0955   BUN 36 (H) 04/26/2018 0955   BUN 66 (H) 04/11/2018 1030   CREATININE 1.24 04/26/2018 1802   CREATININE 1.19 02/28/2015 1548   CALCIUM 9.2 04/26/2018 0955   GFRNONAA 55 (L) 04/26/2018 1802   GFRAA >60 04/26/2018 1802   Lab Results  Component Value Date   HGBA1C 5.5 04/11/2018   HGBA1C 5.5 12/06/2017   HGBA1C 5.8 11/24/2017   HGBA1C 6.0 07/28/2017    HGBA1C 5.7 (H) 06/06/2015   Lab Results  Component Value Date   INSULIN 15.2 04/11/2018   INSULIN 21.5 12/06/2017   CBC    Component Value Date/Time   WBC 7.6 04/26/2018 1802   RBC 3.60 (L) 04/26/2018 1802   HGB 10.7 (L) 04/26/2018 1802   HCT 37.2 (L) 04/26/2018 1802   PLT 124 (L) 04/26/2018 1802   MCV 103.3 (H) 04/26/2018 1802   MCH 29.7 04/26/2018 1802   MCHC 28.8 (L) 04/26/2018 1802   RDW 16.1 (H) 04/26/2018 1802   LYMPHSABS 1.3 07/28/2017 1628   MONOABS 0.5 07/28/2017 1628   EOSABS 0.1 07/28/2017 1628   BASOSABS 0.1 07/28/2017 1628   Iron/TIBC/Ferritin/ %Sat No results found for: IRON, TIBC, FERRITIN, IRONPCTSAT Lipid Panel     Component Value Date/Time   CHOL 110 04/11/2018 1030   TRIG 87 04/11/2018 1030   HDL 32 (L) 04/11/2018 1030   CHOLHDL 4 11/24/2017 1633   VLDL 29.6 11/24/2017 1633   LDLCALC 61 04/11/2018 1030   LDLDIRECT 85.0 07/28/2017 1628   Hepatic Function Panel     Component Value Date/Time   PROT 5.8 (L) 04/26/2018 0955   PROT 5.9 (L) 04/11/2018 1030   ALBUMIN 3.2 (L) 04/26/2018 0955   ALBUMIN 3.5 04/11/2018 1030   AST 27 04/26/2018 0955   ALT 24 04/26/2018 0955   ALKPHOS 72 04/26/2018 0955   BILITOT 0.9 04/26/2018 0955   BILITOT 0.4 04/11/2018 1030   BILIDIR 0.1 02/21/2015 0921   IBILI 0.4 04/19/2014 1418      Component Value Date/Time   TSH 1.71 11/24/2017 1633   TSH 1.30 07/28/2017 1628   TSH 1.18 01/25/2017 1415   Results for ELYE, HARMSEN (MRN [id number]) as of 06/21/2018 08:34  Ref. Range 12/06/2017 09:54  Vitamin D, 25-Hydroxy Latest Ref Range: 30.0 - 100.0 ng/mL 47.9   ASSESSMENT AND PLAN: Other hyperlipidemia  Class 3 severe obesity with serious comorbidity and body mass index (BMI) of 40.0 to 44.9  in adult, unspecified obesity type ([place])  PLAN:  Hyperlipidemia [name] was informed of the American Heart Association Guidelines emphasizing intensive lifestyle modifications as the first line treatment for hyperlipidemia. We  discussed many lifestyle modifications today in depth, and [name] will continue to work on decreasing saturated fats such as fatty red meat, butter and many fried foods. He will also increase vegetables and lean protein in his diet and continue to work on exercise and weight loss efforts. [name] agrees to continue lipitor and CoQ 10 and follow up with [name] as directed to monitor his progress.  I spent > than 50% of the 15 minute visit on counseling as documented in the note.  Obesity [name] is currently in the action stage of change. As such, his goal is to continue with weight loss efforts He has agreed to keep a food journal with 450 to 650 calories and 50 grams of protein at supper daily and follow the Category 3 [name] has been instructed to work up to a goal of 150 minutes of combined cardio and strengthening exercise per week for weight loss and overall health benefits. We discussed the following Behavioral Modification Strategies today: increasing lean protein intake, decreasing simple carbohydrates  and work on meal planning and easy cooking plans  [name] has agreed to follow up with our clinic in 4 weeks. He was informed of the importance of frequent follow up visits to maximize his success with intensive lifestyle modifications for his multiple health conditions.   OBESITY BEHAVIORAL INTERVENTION VISIT  Today's visit was # 10   Starting weight: 385 lbs Starting date: 12/06/17 Today's weight : 349 lbs Today's date: 06/20/2018 Total lbs lost to date: 18.   ASK: We discussed the diagnosis of obesity with [name] agreed to give [name] permission to discuss obesity behavioral modification therapy today.  ASSESS: [name] has the diagnosis of obesity and his BMI today is 44.79 [name] is in the action stage of change   ADVISE: [name] was educated on the multiple health risks of obesity as well as the benefit of weight loss to improve his health. He was advised of the need  for long term treatment and the importance of lifestyle modifications to improve his current health and to decrease his risk of future health problems.  AGREE: Multiple dietary modification options and treatment options were discussed and  [name] agreed to follow the recommendations documented in the above note.  ARRANGE: [name] was educated on the importance of frequent visits to treat obesity as outlined per CMS and USPSTF guidelines and agreed to schedule his next follow up appointment today.  I, Doreene Nest, am acting as transcriptionist for Dennard Nip, MD  I have reviewed the above documentation for accuracy and completeness, and I agree with the above. -Dennard Nip, MD

## 2018-06-29 ENCOUNTER — Ambulatory Visit (INDEPENDENT_AMBULATORY_CARE_PROVIDER_SITE_OTHER): Payer: PPO | Admitting: Family Medicine

## 2018-06-29 DIAGNOSIS — R001 Bradycardia, unspecified: Secondary | ICD-10-CM | POA: Diagnosis not present

## 2018-06-29 DIAGNOSIS — E7800 Pure hypercholesterolemia, unspecified: Secondary | ICD-10-CM | POA: Diagnosis not present

## 2018-06-29 DIAGNOSIS — I359 Nonrheumatic aortic valve disorder, unspecified: Secondary | ICD-10-CM | POA: Diagnosis not present

## 2018-06-29 DIAGNOSIS — I10 Essential (primary) hypertension: Secondary | ICD-10-CM | POA: Diagnosis not present

## 2018-06-29 DIAGNOSIS — N289 Disorder of kidney and ureter, unspecified: Secondary | ICD-10-CM | POA: Diagnosis not present

## 2018-06-29 DIAGNOSIS — R739 Hyperglycemia, unspecified: Secondary | ICD-10-CM

## 2018-06-29 LAB — COMPREHENSIVE METABOLIC PANEL
ALT: 18 U/L (ref 0–53)
AST: 18 U/L (ref 0–37)
Albumin: 3.7 g/dL (ref 3.5–5.2)
Alkaline Phosphatase: 76 U/L (ref 39–117)
BUN: 32 mg/dL — ABNORMAL HIGH (ref 6–23)
CO2: 38 mEq/L — ABNORMAL HIGH (ref 19–32)
Calcium: 9.4 mg/dL (ref 8.4–10.5)
Chloride: 98 mEq/L (ref 96–112)
Creatinine, Ser: 1.07 mg/dL (ref 0.40–1.50)
GFR: 71.43 mL/min (ref >60.00)
Glucose, Bld: 91 mg/dL (ref 70–99)
Potassium: 4.6 mEq/L (ref 3.5–5.1)
Sodium: 140 mEq/L (ref 135–145)
Total Bilirubin: 0.4 mg/dL (ref 0.2–1.2)
Total Protein: 6.3 g/dL (ref 6.0–8.3)

## 2018-06-29 LAB — CBC
HCT: 37.6 % — ABNORMAL LOW (ref 39.0–52.0)
Hemoglobin: 12.1 g/dL — ABNORMAL LOW (ref 13.0–17.0)
MCHC: 32.1 g/dL (ref 30.0–36.0)
MCV: 93.9 fl (ref 78.0–100.0)
Platelets: 157 10*3/uL (ref 150.0–400.0)
RBC: 4 Mil/uL — ABNORMAL LOW (ref 4.22–5.81)
RDW: 16.7 % — ABNORMAL HIGH (ref 11.5–15.5)
WBC: 8.6 10*3/uL (ref 4.0–10.5)

## 2018-06-29 MED ORDER — FERROUS FUMARATE-FOLIC ACID 324-1 MG PO TABS: 1.0000 | ORAL_TABLET | ORAL | 1 refills | Status: DC

## 2018-06-29 NOTE — Assessment & Plan Note (Signed)
Check cmp and maintain hydration

## 2018-06-29 NOTE — Assessment & Plan Note (Signed)
S/p replacement for 4 years.

## 2018-06-29 NOTE — Patient Instructions (Addendum)
Zyrtec/Cetirizine or Claritin for allergies   Encouraged increased rest and hydration, add probiotics, zinc such as Coldeze or Xicam. Treat fevers as needed. Vitamin 500 mg and elderberry. Call if worsens to consider further treatment   Hypertension Hypertension, commonly called high blood pressure, is when the force of blood pumping through the arteries is too strong. The arteries are the blood vessels that carry blood from the heart throughout the body. Hypertension forces the heart to work harder to pump blood and may cause arteries to become narrow or stiff. Having untreated or uncontrolled hypertension can cause heart attacks, strokes, kidney disease, and other problems. A blood pressure reading consists of a higher number over a lower number. Ideally, your blood pressure should be below 120/80. The first ("top") number is called the systolic pressure. It is a measure of the pressure in your arteries as your heart beats. The second ("bottom") number is called the diastolic pressure. It is a measure of the pressure in your arteries as the heart relaxes. What are the causes? The cause of this condition is not known. What increases the risk? Some risk factors for high blood pressure are under your control. Others are not. Factors you can change  Smoking.  Having type 2 diabetes mellitus, high cholesterol, or both.  Not getting enough exercise or physical activity.  Being overweight.  Having too much fat, sugar, calories, or salt (sodium) in your diet.  Drinking too much alcohol. Factors that are difficult or impossible to change  Having chronic kidney disease.  Having a family history of high blood pressure.  Age. Risk increases with age.  Race. You may be at higher risk if you are African-American.  Gender. Men are at higher risk than women before age 72. After age 7, women are at higher risk than men.  Having obstructive sleep apnea.  Stress. What are the signs or  symptoms? Extremely high blood pressure (hypertensive crisis) may cause:  Headache.  Anxiety.  Shortness of breath.  Nosebleed.  Nausea and vomiting.  Severe chest pain.  Jerky movements you cannot control (seizures).  How is this diagnosed? This condition is diagnosed by measuring your blood pressure while you are seated, with your arm resting on a surface. The cuff of the blood pressure monitor will be placed directly against the skin of your upper arm at the level of your heart. It should be measured at least twice using the same arm. Certain conditions can cause a difference in blood pressure between your right and left arms. Certain factors can cause blood pressure readings to be lower or higher than normal (elevated) for a short period of time:  When your blood pressure is higher when you are in a health care provider's office than when you are at home, this is called white coat hypertension. Most people with this condition do not need medicines.  When your blood pressure is higher at home than when you are in a health care provider's office, this is called masked hypertension. Most people with this condition may need medicines to control blood pressure.  If you have a high blood pressure reading during one visit or you have normal blood pressure with other risk factors:  You may be asked to return on a different day to have your blood pressure checked again.  You may be asked to monitor your blood pressure at home for 1 week or longer.  If you are diagnosed with hypertension, you may have other blood or imaging tests  to help your health care provider understand your overall risk for other conditions. How is this treated? This condition is treated by making healthy lifestyle changes, such as eating healthy foods, exercising more, and reducing your alcohol intake. Your health care provider may prescribe medicine if lifestyle changes are not enough to get your blood pressure  under control, and if:  Your systolic blood pressure is above 130.  Your diastolic blood pressure is above 80.  Your personal target blood pressure may vary depending on your medical conditions, your age, and other factors. Follow these instructions at home: Eating and drinking  Eat a diet that is high in fiber and potassium, and low in sodium, added sugar, and fat. An example eating plan is called the DASH (Dietary Approaches to Stop Hypertension) diet. To eat this way: ? Eat plenty of fresh fruits and vegetables. Try to fill half of your plate at each meal with fruits and vegetables. ? Eat whole grains, such as whole wheat pasta, brown rice, or whole grain bread. Fill about one quarter of your plate with whole grains. ? Eat or drink low-fat dairy products, such as skim milk or low-fat yogurt. ? Avoid fatty cuts of meat, processed or cured meats, and poultry with skin. Fill about one quarter of your plate with lean proteins, such as fish, chicken without skin, beans, eggs, and tofu. ? Avoid premade and processed foods. These tend to be higher in sodium, added sugar, and fat.  Reduce your daily sodium intake. Most people with hypertension should eat less than 1,500 mg of sodium a day.  Limit alcohol intake to no more than 1 drink a day for nonpregnant women and 2 drinks a day for men. One drink equals 12 oz of beer, 5 oz of wine, or 1 oz of hard liquor. Lifestyle  Work with your health care provider to maintain a healthy body weight or to lose weight. Ask what an ideal weight is for you.  Get at least 30 minutes of exercise that causes your heart to beat faster (aerobic exercise) most days of the week. Activities may include walking, swimming, or biking.  Include exercise to strengthen your muscles (resistance exercise), such as pilates or lifting weights, as part of your weekly exercise routine. Try to do these types of exercises for 30 minutes at least 3 days a week.  Do not use any  products that contain nicotine or tobacco, such as cigarettes and e-cigarettes. If you need help quitting, ask your health care provider.  Monitor your blood pressure at home as told by your health care provider.  Keep all follow-up visits as told by your health care provider. This is important. Medicines  Take over-the-counter and prescription medicines only as told by your health care provider. Follow directions carefully. Blood pressure medicines must be taken as prescribed.  Do not skip doses of blood pressure medicine. Doing this puts you at risk for problems and can make the medicine less effective.  Ask your health care provider about side effects or reactions to medicines that you should watch for. Contact a health care provider if:  You think you are having a reaction to a medicine you are taking.  You have headaches that keep coming back (recurring).  You feel dizzy.  You have swelling in your ankles.  You have trouble with your vision. Get help right away if:  You develop a severe headache or confusion.  You have unusual weakness or numbness.  You feel faint.  You have severe pain in your chest or abdomen.  You vomit repeatedly.  You have trouble breathing. Summary  Hypertension is when the force of blood pumping through your arteries is too strong. If this condition is not controlled, it may put you at risk for serious complications.  Your personal target blood pressure may vary depending on your medical conditions, your age, and other factors. For most people, a normal blood pressure is less than 120/80.  Hypertension is treated with lifestyle changes, medicines, or a combination of both. Lifestyle changes include weight loss, eating a healthy, low-sodium diet, exercising more, and limiting alcohol. This information is not intended to replace advice given to you by your health care provider. Make sure you discuss any questions you have with your health care  provider. Document Released: 09/06/2005 Document Revised: 08/04/2016 Document Reviewed: 08/04/2016 Elsevier Interactive Patient Education  [employer].

## 2018-06-29 NOTE — Assessment & Plan Note (Signed)
Encouraged DASH diet, decrease po intake and increase exercise as tolerated. Needs 7-8 hours of sleep nightly. Avoid trans fats, eat small, frequent meals every 4-5 hours with lean proteins, complex carbs and healthy fats 

## 2018-06-29 NOTE — Assessment & Plan Note (Signed)
hgba1c acceptable, minimize simple carbs. Increase exercise as tolerated.  

## 2018-06-29 NOTE — Assessment & Plan Note (Signed)
Well controlled, no changes to meds. Encouraged heart healthy diet such as the DASH diet and exercise as tolerated.  °

## 2018-06-29 NOTE — Assessment & Plan Note (Addendum)
S/p pacer in place x 2 months, doing well and denies any further dizzy episodes, recently seen by Dr [name] and he is doing well

## 2018-06-29 NOTE — Assessment & Plan Note (Signed)
Encouraged heart healthy diet, increase exercise, avoid trans fats, consider a krill oil cap daily 

## 2018-07-02 ENCOUNTER — Other Ambulatory Visit: Payer: Self-pay | Admitting: Cardiovascular Disease

## 2018-07-02 ENCOUNTER — Encounter: Payer: Self-pay | Admitting: Sports Medicine

## 2018-07-02 NOTE — Progress Notes (Signed)
Subjective:    Patient ID: [name], male    DOB: 10-17-1940, 77 y.o.   MRN: [id number]  No chief complaint on file.   HPI Patient is in today for follow up. He feels well today. He had sudden onset bradycardia and now has a pacemaker since his last visit here. He denies any acute complaints. No febrile illness or other hospitalizations. He is struggling with some edema but it is manageable. He is eating well. Denies CP/palp/SOB/HA/congestion/fevers/GI or GU c/o. Taking meds as prescribed  Past Medical History:  Diagnosis Date  . Anemia   . Aortic stenosis   . Aortic stenosis, severe    S/p Edwards Sapien 3 Transcatheter Heart Valve (size 26 mm, model # [id number], serial # [id number])  . Arthritis   . Back pain   . Bursitis   . Cataract    left immature  . Cellulitis 10/12/2015  . Complication of anesthesia    Halucinations  . Constipation    takes Miralax daily as well as Senokot daily  . DDD (degenerative disc disease)   . Gout    takes Allopurinol daily  . Heart valve disorder   . History of blood clots 1962   knee  . History of blood transfusion    no abnormal reaction noted  . History of shingles   . Hyperlipidemia    takes Atorvastatin daily  . Hypertension    takes Lisinopril daily  . Joint pain   . Low back pain 01/25/2017  . Neck pain on left side 01/25/2017  . OSA (obstructive sleep apnea)   . Osteoarthritis   . Peripheral edema    takes Furosemide daily  . Peripheral neuropathy    takes Gabapentin daily  . Peroneal palsy    significant right foot drop  . Pneumonia 25+yrs ago   hx of  . Shortness of breath   . Swelling of extremity   . Urinary frequency   . Urinary urgency   . Valvular heart disease     Past Surgical History:  Procedure Laterality Date  . CARDIAC CATHETERIZATION N/A 05/02/2015   Procedure: Right/Left Heart Cath and Coronary Angiography;  Surgeon: Burnell Blanks, MD;  Location: [facility];  Service: Cardiovascular;   Laterality: N/A;  . COLONOSCOPY N/A 02/07/2013   Procedure: COLONOSCOPY;  Surgeon: Juanita Craver, MD;  Location: [facility] ENDOSCOPY;  Service: Endoscopy;  Laterality: N/A;  . COLONOSCOPY N/A 02/09/2013   Procedure: COLONOSCOPY;  Surgeon: Beryle Beams, MD;  Location: [place];  Service: Endoscopy;  Laterality: N/A;  . ESOPHAGOGASTRODUODENOSCOPY N/A 02/07/2013   Procedure: ESOPHAGOGASTRODUODENOSCOPY (EGD);  Surgeon: Juanita Craver, MD;  Location: [facility] ENDOSCOPY;  Service: Endoscopy;  Laterality: N/A;  . GIVENS CAPSULE STUDY N/A 02/09/2013   Procedure: GIVENS CAPSULE STUDY;  Surgeon: Beryle Beams, MD;  Location: [place];  Service: Endoscopy;  Laterality: N/A;  . HERNIA REPAIR     umbilical hernia  . KNEE SURGERY Right    multiple knee surgeries due to complication of R TKA  . LAMINOTOMY  0233   c6-t2  . PACEMAKER IMPLANT N/A 04/27/2018   Procedure: PACEMAKER IMPLANT;  Surgeon: Constance Haw, MD;  Location: [place] [facility];  Service: Cardiovascular;  Laterality: N/A;  . SPINE SURGERY    . TEE WITHOUT CARDIOVERSION N/A 03/07/2015   Procedure: TRANSESOPHAGEAL ECHOCARDIOGRAM (TEE);  Surgeon: Josue Hector, MD;  Location: [facility] ENDOSCOPY;  Service: Cardiovascular;  Laterality: N/A;  ANES TO BRING PROPOFOL PER DOCTOR  .  TEE WITHOUT CARDIOVERSION N/A 06/10/2015   Procedure: TRANSESOPHAGEAL ECHOCARDIOGRAM (TEE);  Surgeon: Burnell Blanks, MD;  Location: [place];  Service: Open Heart Surgery;  Laterality: N/A;  . TOTAL KNEE ARTHROPLASTY     right  . TRANSCATHETER AORTIC VALVE REPLACEMENT, TRANSFEMORAL Left 06/10/2015   Procedure: TRANSCATHETER AORTIC VALVE REPLACEMENT, TRANSFEMORAL;  Surgeon: Burnell Blanks, MD;  Location: [place];  Service: Open Heart Surgery;  Laterality: Left;    Family History  Problem Relation Age of Onset  . Arthritis Sister        "crippling"  . Arthritis Sister     Social History   Socioeconomic History  . Marital status: Married    Spouse name: [name]    . Number of children: 3  . Years of education: Not on file  . Highest education level: Not on file  Occupational History  . Occupation: retired Investment banker, operational  . Financial resource strain: Not on file  . Food insecurity:    Worry: Not on file    Inability: Not on file  . Transportation needs:    Medical: Not on file    Non-medical: Not on file  Tobacco Use  . Smoking status: Former Smoker    Years: 30.00    Types: Pipe    Last attempt to quit: 09/20/2006    Years since quitting: 11.7  . Smokeless tobacco: Never Used  Substance and Sexual Activity  . Alcohol use: No    Alcohol/week: 0.0 standard drinks  . Drug use: No  . Sexual activity: Never    Comment: lives with wife, retired, no dietary restrictions  Lifestyle  . Physical activity:    Days per week: Not on file    Minutes per session: Not on file  . Stress: Not on file  Relationships  . Social connections:    Talks on phone: Not on file    Gets together: Not on file    Attends religious service: Not on file    Active member of club or organization: Not on file    Attends meetings of clubs or organizations: Not on file    Relationship status: Not on file  . Intimate partner violence:    Fear of current or ex partner: Not on file    Emotionally abused: Not on file    Physically abused: Not on file    Forced sexual activity: Not on file  Other Topics Concern  . Not on file  Social History Narrative  . Not on file    Outpatient Medications Prior to Visit  Medication Sig Dispense Refill  . acetaminophen (TYLENOL) 500 MG tablet Take 1,000 mg by mouth 2 (two) times daily.     [name] aspirin 81 MG EC tablet TAKE 1 TABLET (81 MG TOTAL) BY MOUTH DAILY. 30 tablet 11  . atorvastatin (LIPITOR) 40 MG tablet Take 1 and a half tablet daily by mouth  (60mg ) 135 tablet 5  . cholecalciferol (VITAMIN D) 1000 UNITS tablet Take 2,000 Units by mouth daily.     . clopidogrel (PLAVIX) 75 MG tablet TAKE 1 TABLET (75 MG TOTAL)  BY MOUTH DAILY WITH BREAKFAST. 90 tablet 0  . Coenzyme Q10 (COQ10) 200 MG CAPS Take 200 mg by mouth daily.    . furosemide (LASIX) 20 MG tablet Take 1 tablet (20 mg total) by mouth 2 (two) times daily. Please keep upcoming appt in September for future refills. Thank you 180 tablet 0  . gabapentin (NEURONTIN) 300 MG  capsule Take 1 capsule (300 mg total) by mouth 3 (three) times daily. 270 capsule 2  . Misc Natural Products (TART CHERRY ADVANCED PO) Take 1 capsule by mouth daily.    . Multiple Vitamin (MULTIVITAMIN WITH MINERALS) TABS Take 1 tablet by mouth daily.    . polyethylene glycol (MIRALAX / GLYCOLAX) packet Take 17 g by mouth daily.    . sennosides-docusate sodium (SENOKOT-S) 8.6-50 MG tablet Take 1 tablet by mouth 2 (two) times daily.    . Ferrous Fumarate-Folic Acid (HEMATINIC/FOLIC ACID) 532-9 MG TABS Take 1 tablet by mouth daily. 90 each 1  . allopurinol (ZYLOPRIM) 300 MG tablet Take 1 tablet (300 mg total) by mouth daily. 90 tablet 1   No facility-administered medications prior to visit.     Allergies  Allergen Reactions  . Coumadin [Warfarin Sodium] Other (See Comments)    States he can't be on this-bleeds out    Review of Systems  Constitutional: Negative for fever.  HENT: Negative for congestion.   Eyes: Negative for blurred vision.  Respiratory: Negative for cough.   Cardiovascular: Negative for chest pain and palpitations.  Gastrointestinal: Negative for vomiting.  Musculoskeletal: Negative for back pain.  Skin: Negative for rash.  Neurological: Negative for loss of consciousness and headaches.       Objective:    Physical Exam  Constitutional: He is oriented to person, place, and time. He appears well-developed and well-nourished. No distress.  HENT:  Head: Normocephalic and atraumatic.  Nose: Nose normal.  Eyes: Right eye exhibits no discharge. Left eye exhibits no discharge.  Neck: Normal range of motion. Neck supple.  Cardiovascular: Normal rate and  regular rhythm.  Pulmonary/Chest: Effort normal and breath sounds normal.  Abdominal: Soft. Bowel sounds are normal. There is no tenderness.  Musculoskeletal: He exhibits no edema.  Neurological: He is alert and oriented to person, place, and time.  Skin: Skin is warm and dry.  Psychiatric: He has a normal mood and affect.  Nursing note and vitals reviewed.   BP 119/64 (BP Location: Left Arm, Patient Position: Sitting, Cuff Size: Normal)   Pulse 60   Temp 98.7 F (37.1 C) (Oral)   Resp 18   Wt (!) 349 lb (158.3 kg)   SpO2 94%   BMI 44.81 kg/m  Wt Readings from Last 3 Encounters:  06/29/18 (!) 349 lb (158.3 kg)  06/20/18 (!) 349 lb (158.3 kg)  06/16/18 (!) 358 lb (162.4 kg)     Lab Results  Component Value Date   WBC 8.6 06/29/2018   HGB 12.1 (L) 06/29/2018   HCT 37.6 (L) 06/29/2018   PLT 157.0 06/29/2018   GLUCOSE 91 06/29/2018   CHOL 110 04/11/2018   TRIG 87 04/11/2018   HDL 32 (L) 04/11/2018   LDLDIRECT 85.0 07/28/2017   LDLCALC 61 04/11/2018   ALT 18 06/29/2018   AST 18 06/29/2018   NA 140 06/29/2018   K 4.6 06/29/2018   CL 98 06/29/2018   CREATININE 1.07 06/29/2018   BUN 32 (H) 06/29/2018   CO2 38 (H) 06/29/2018   TSH 1.71 11/24/2017   INR 1.32 06/10/2015   HGBA1C 5.5 04/11/2018    Lab Results  Component Value Date   TSH 1.71 11/24/2017   Lab Results  Component Value Date   WBC 8.6 06/29/2018   HGB 12.1 (L) 06/29/2018   HCT 37.6 (L) 06/29/2018   MCV 93.9 06/29/2018   PLT 157.0 06/29/2018   Lab Results  Component Value Date   NA  140 06/29/2018   K 4.6 06/29/2018   CO2 38 (H) 06/29/2018   GLUCOSE 91 06/29/2018   BUN 32 (H) 06/29/2018   CREATININE 1.07 06/29/2018   BILITOT 0.4 06/29/2018   ALKPHOS 76 06/29/2018   AST 18 06/29/2018   ALT 18 06/29/2018   PROT 6.3 06/29/2018   ALBUMIN 3.7 06/29/2018   CALCIUM 9.4 06/29/2018   ANIONGAP 12 04/26/2018   GFR 71.43 06/29/2018   Lab Results  Component Value Date   CHOL 110 04/11/2018   Lab  Results  Component Value Date   HDL 32 (L) 04/11/2018   Lab Results  Component Value Date   LDLCALC 61 04/11/2018   Lab Results  Component Value Date   TRIG 87 04/11/2018   Lab Results  Component Value Date   CHOLHDL 4 11/24/2017   Lab Results  Component Value Date   HGBA1C 5.5 04/11/2018       Assessment & Plan:   Problem List Items Addressed This Visit    HYPERCHOLESTEROLEMIA    Encouraged heart healthy diet, increase exercise, avoid trans fats, consider a krill oil cap daily      OBESITY, MORBID    Encouraged DASH diet, decrease po intake and increase exercise as tolerated. Needs 7-8 hours of sleep nightly. Avoid trans fats, eat small, frequent meals every 4-5 hours with lean proteins, complex carbs and healthy fats.      Aortic valve disorder    S/p replacement for 4 years.       Hyperglycemia    hgba1c acceptable, minimize simple carbs. Increase exercise as tolerated.       Essential hypertension    Well controlled, no changes to meds. Encouraged heart healthy diet such as the DASH diet and exercise as tolerated.       Relevant Orders   Comprehensive metabolic panel (Completed)   CBC (Completed)   Bradycardia    S/p pacer in place x 2 months, doing well and denies any further dizzy episodes, recently seen by Dr [name] and he is doing well      Renal insufficiency    Check cmp and maintain hydration      Relevant Orders   Comprehensive metabolic panel (Completed)      I have discontinued Beth D. Kitzmiller's allopurinol. I have also changed his Ferrous Fumarate-Folic Acid. Additionally, I am having him maintain his cholecalciferol, sennosides-docusate sodium, multivitamin with minerals, acetaminophen, CoQ10, polyethylene glycol, aspirin, Misc Natural Products (TART CHERRY ADVANCED PO), clopidogrel, atorvastatin, gabapentin, and furosemide.  Meds ordered this encounter  Medications  . Ferrous Fumarate-Folic Acid (HEMATINIC/FOLIC ACID) 340-3 MG TABS     Sig: Take 1 tablet by mouth 2 (two) times a week.    Dispense:  90 each    Refill:  1     Penni Homans, MD

## 2018-07-05 ENCOUNTER — Encounter: Payer: Self-pay | Admitting: Internal Medicine

## 2018-07-06 ENCOUNTER — Encounter: Payer: Self-pay | Admitting: Internal Medicine

## 2018-07-06 ENCOUNTER — Ambulatory Visit: Payer: PPO | Admitting: Internal Medicine

## 2018-07-06 DIAGNOSIS — G4733 Obstructive sleep apnea (adult) (pediatric): Secondary | ICD-10-CM

## 2018-07-06 DIAGNOSIS — I350 Nonrheumatic aortic (valve) stenosis: Secondary | ICD-10-CM

## 2018-07-06 DIAGNOSIS — J449 Chronic obstructive pulmonary disease, unspecified: Secondary | ICD-10-CM

## 2018-07-06 NOTE — Patient Instructions (Addendum)
We can continue VPAPauto  12/12, PS 4   Please call if we can help

## 2018-07-06 NOTE — Progress Notes (Signed)
HPI M former smoker followed for OSA, COPD, chronic hypoxic respiratory failure complicated by morbid obesity, OHS, aortic stenosis/ AVR,  Osteoarthritis, Anemia PFT 05/05/2015-severe obstructive airways disease, insignificant response to bronchodilator, severe restriction, moderate diffusion defect FVC 2.17/46%, FEV1 1.50/44%, ratio 0.69, TLC 64%, DLCO 57% with volume correction to 62% of predicted NPSG 06/21/85- AHI 98/hour with desaturation to 64% BiPAP titration study-21/17, PS 4 cwp O2 2L sleep, with residual AHI 52 "unknown" events, either obstructive or central and presumably mostly RERAs.  ------------------------------------------------------------  01/02/18- 77 year old male former smoker followed for OSA, complicated by morbid obesity, aortic stenosis/aVR, osteoarthritis BiPAP 12/12, PS 4/ O2 2L sleep/Advanced ---OSA;DME: [facility]. Pt wears BiPAP nightly and DL attached.  Download 100% compliance AHI 0.7/hour.  He is comfortable with his BiPAP oxygen combination, sleeping well.  He asks about travel out of town and how to manage the oxygen.  I have asked him to discuss this with his DME company. He denies new medical concerns. Now on a weight loss program through [facility] for the past month and is strongly encouraged to stick with it.  07/06/2018- 77 year old male former smoker followed for OSA, COPD mixed type, complicated by morbid obesity, aortic stenosis/aVR/ pacemaker, osteoarthritis, AVM colon, HBP, gout, VPAPauto 12/12, PS 4/ O2 2L sleep/Advanced Body weight today 348 pounds  Here with wife, reporting that he is doing well with no acute issues.  Now has pacemaker.  Had flu shot. Download 100% compliance AHI 0.5/hour.  He is comfortable with his pressures and mask and reports sleeping well in his recliner. He denies active cough or wheeze and says breathing is stable without problems recently. CXR 04/28/2018 IMPRESSION: 1. Limited visualization due to body habitus. 2. New pacer leads  without pneumothorax or other complicating feature. The right ventricular lead is poorly visualized due to technical factors.  ROS- see HPI  + = positive Constitutional:   No-   weight loss, night sweats, fevers, chills, +fatigue, lassitude. HEENT:   No-  headaches, difficulty swallowing, tooth/dental problems, sore throat,       No-  sneezing, itching, ear ache, nasal congestion, post nasal drip,  CV:  No-   chest pain, orthopnea, PND, swelling in lower extremities, anasarca,  dizziness, palpitations Resp: +shortness of breath with exertion or at rest.              No-   productive cough,  No non-productive cough,  No- coughing up of blood.              No-   change in color of mucus.  No- wheezing.   Skin: No-   rash or lesions. GI:  No-   heartburn, indigestion, abdominal pain, nausea, vomiting, GU:  MS:  No-   joint pain or swelling.   Neuro-     nothing unusual Psych:  No- change in mood or affect. No depression or anxiety.  No memory loss.  OBJ General- Alert, Oriented, Affect-appropriate, Distress- none acute. +Morbidly obese, +power wheelchair  Skin- rash-none, lesions- none, excoriation- none Lymphadenopathy- none Head- atraumatic            Eyes- Gross vision intact, PERRLA, conjunctivae clear secretions            Ears- Hearing, canals-normal            Nose- Clear, no-Septal dev, mucus, polyps, erosion, perforation             Throat- Mallampati III-IV , mucosa clear , drainage- none, tonsils- atrophic Neck- flexible ,  trachea midline, no stridor , thyroid nl, carotid no bruit Chest - symmetrical excursion , unlabored           Heart/CV- RRR , murmur-none, no gallop, no rub, nl s1 s2                           - JVD- none , edema+, stasis changes+, varices- none           Lung- clear to P&A, wheeze- none, cough- none , dullness-none, rub- none, no valve click heard                           Room air saturation while awake and sitting upright on arrival today 91%            Chest wall-+ pacemaker left  abd-  Br/ Gen/ Rectal- Not done, not indicated Extrem- cyanosis- none, clubbing, none, atrophy- none, strength- nl Neuro- grossly intact to observation, alert and pleasant

## 2018-07-07 NOTE — Assessment & Plan Note (Addendum)
He remains very comfortable with VPAPauto  inspiratory max 12, expiratory minimum 12. PS 4, with O2 2L Plan-continue current settings

## 2018-07-07 NOTE — Assessment & Plan Note (Signed)
I remain hopeful but really see no prospect that he is going to make the changes necessary to accomplish meaningful weight loss.  Associated chronic back and joint pains, and chronic stasis changes in his legs are complicating features which markedly limited mobility and confined him to a power chair and recliner.

## 2018-07-07 NOTE — Assessment & Plan Note (Signed)
Medically stable currently.  He is up-to-date on flu vaccine. Plan-continue current meds, avoid sick contact.

## 2018-07-07 NOTE — Assessment & Plan Note (Signed)
He relates need for pacemaker to his previous aortic valve replacement.  Followed by cardiology.

## 2018-07-16 DIAGNOSIS — G4733 Obstructive sleep apnea (adult) (pediatric): Secondary | ICD-10-CM | POA: Diagnosis not present

## 2018-07-16 DIAGNOSIS — R0602 Shortness of breath: Secondary | ICD-10-CM | POA: Diagnosis not present

## 2018-07-18 ENCOUNTER — Ambulatory Visit (INDEPENDENT_AMBULATORY_CARE_PROVIDER_SITE_OTHER): Payer: PPO | Admitting: Family Medicine

## 2018-07-18 VITALS — BP 142/67 | HR 71 | Temp 98.1°F | Ht 74.0 in | Wt 358.0 lb

## 2018-07-18 DIAGNOSIS — Z6841 Body Mass Index (BMI) 40.0 and over, adult: Secondary | ICD-10-CM | POA: Diagnosis not present

## 2018-07-18 DIAGNOSIS — E7849 Other hyperlipidemia: Secondary | ICD-10-CM | POA: Diagnosis not present

## 2018-07-19 ENCOUNTER — Encounter (INDEPENDENT_AMBULATORY_CARE_PROVIDER_SITE_OTHER): Payer: Self-pay | Admitting: Family Medicine

## 2018-07-19 NOTE — Progress Notes (Signed)
Office: [phone]  /  Fax: [phone]   HPI:   Chief Complaint: [name] is here to discuss his progress with his obesity treatment plan. He is following the Category 3 plan and is following his eating plan approximately 60 % of the time. He states he is exercising 0 minutes 0 times per week. [name] was on vacation and had increased celebration eating. He states that he has had significant increase in sugar intake and other simple carbs. [name] states that he is ready to get back on track with the Category 3 plan.  His weight is (!) 358 lb (162.4 kg) today and he has had a weight gain of 9 lbs since his last visit. He has lost 36 lbs since starting treatment with [name].  Hyperlipidemia [name] has hyperlipidemia and has been trying to improve his cholesterol levels with intensive lifestyle modification including a low saturated fat diet, exercise and weight loss. He denies any chest pain, claudication or myalgias. [name] is currently taking Lipitor but not following diet well and has gained weight.    ALLERGIES: Allergies  Allergen Reactions  . Coumadin [Warfarin Sodium] Other (See Comments)    States he can't be on this-bleeds out    MEDICATIONS: Current Outpatient Medications on File Prior to Visit  Medication Sig Dispense Refill  . acetaminophen (TYLENOL) 500 MG tablet Take 1,000 mg by mouth 2 (two) times daily.     [name] aspirin 81 MG EC tablet TAKE 1 TABLET (81 MG TOTAL) BY MOUTH DAILY. 30 tablet 11  . atorvastatin (LIPITOR) 40 MG tablet Take 1 and a half tablet daily by mouth  (60mg ) 135 tablet 5  . cholecalciferol (VITAMIN D) 1000 UNITS tablet Take 2,000 Units by mouth daily.     . clopidogrel (PLAVIX) 75 MG tablet TAKE 1 TABLET (75 MG TOTAL) BY MOUTH DAILY WITH BREAKFAST. 90 tablet 3  . Coenzyme Q10 (COQ10) 200 MG CAPS Take 200 mg by mouth daily.    . Ferrous Fumarate-Folic Acid (HEMATINIC/FOLIC ACID) 892-1 MG TABS Take 1 tablet by mouth 2 (two) times a week. 90 each 1  .  furosemide (LASIX) 20 MG tablet Take 1 tablet (20 mg total) by mouth 2 (two) times daily. Please keep upcoming appt in September for future refills. Thank you 180 tablet 0  . gabapentin (NEURONTIN) 300 MG capsule Take 1 capsule (300 mg total) by mouth 3 (three) times daily. 270 capsule 2  . Misc Natural Products (TART CHERRY ADVANCED PO) Take 1 capsule by mouth daily.    . Multiple Vitamin (MULTIVITAMIN WITH MINERALS) TABS Take 1 tablet by mouth daily.    . polyethylene glycol (MIRALAX / GLYCOLAX) packet Take 17 g by mouth daily.    . sennosides-docusate sodium (SENOKOT-S) 8.6-50 MG tablet Take 1 tablet by mouth 2 (two) times daily.     No current facility-administered medications on file prior to visit.     PAST MEDICAL HISTORY: Past Medical History:  Diagnosis Date  . Anemia   . Aortic stenosis   . Aortic stenosis, severe    S/p Edwards Sapien 3 Transcatheter Heart Valve (size 26 mm, model # [id number], serial # [id number])  . Arthritis   . Back pain   . Bursitis   . Cataract    left immature  . Cellulitis 10/12/2015  . Complication of anesthesia    Halucinations  . Constipation    takes Miralax daily as well as Senokot daily  . DDD (degenerative disc disease)   . Gout  takes Allopurinol daily  . Heart valve disorder   . History of blood clots 1962   knee  . History of blood transfusion    no abnormal reaction noted  . History of shingles   . Hyperlipidemia    takes Atorvastatin daily  . Hypertension    takes Lisinopril daily  . Joint pain   . Low back pain 01/25/2017  . Neck pain on left side 01/25/2017  . OSA (obstructive sleep apnea)   . Osteoarthritis   . Peripheral edema    takes Furosemide daily  . Peripheral neuropathy    takes Gabapentin daily  . Peroneal palsy    significant right foot drop  . Pneumonia 25+yrs ago   hx of  . Shortness of breath   . Swelling of extremity   . Urinary frequency   . Urinary urgency   . Valvular heart disease     PAST  SURGICAL HISTORY: Past Surgical History:  Procedure Laterality Date  . CARDIAC CATHETERIZATION N/A 05/02/2015   Procedure: Right/Left Heart Cath and Coronary Angiography;  Surgeon: Burnell Blanks, MD;  Location: [place] [facility];  Service: Cardiovascular;  Laterality: N/A;  . COLONOSCOPY N/A 02/07/2013   Procedure: COLONOSCOPY;  Surgeon: Juanita Craver, MD;  Location: [facility] ENDOSCOPY;  Service: Endoscopy;  Laterality: N/A;  . COLONOSCOPY N/A 02/09/2013   Procedure: COLONOSCOPY;  Surgeon: Beryle Beams, MD;  Location: [place];  Service: Endoscopy;  Laterality: N/A;  . ESOPHAGOGASTRODUODENOSCOPY N/A 02/07/2013   Procedure: ESOPHAGOGASTRODUODENOSCOPY (EGD);  Surgeon: Juanita Craver, MD;  Location: [facility] ENDOSCOPY;  Service: Endoscopy;  Laterality: N/A;  . GIVENS CAPSULE STUDY N/A 02/09/2013   Procedure: GIVENS CAPSULE STUDY;  Surgeon: Beryle Beams, MD;  Location: [place];  Service: Endoscopy;  Laterality: N/A;  . HERNIA REPAIR     umbilical hernia  . KNEE SURGERY Right    multiple knee surgeries due to complication of R TKA  . LAMINOTOMY  5176   c6-t2  . PACEMAKER IMPLANT N/A 04/27/2018   Procedure: PACEMAKER IMPLANT;  Surgeon: Constance Haw, MD;  Location: [employer];  Service: Cardiovascular;  Laterality: N/A;  . SPINE SURGERY    . TEE WITHOUT CARDIOVERSION N/A 03/07/2015   Procedure: TRANSESOPHAGEAL ECHOCARDIOGRAM (TEE);  Surgeon: Josue Hector, MD;  Location: [facility] ENDOSCOPY;  Service: Cardiovascular;  Laterality: N/A;  ANES TO BRING PROPOFOL PER DOCTOR  . TEE WITHOUT CARDIOVERSION N/A 06/10/2015   Procedure: TRANSESOPHAGEAL ECHOCARDIOGRAM (TEE);  Surgeon: Burnell Blanks, MD;  Location: [place];  Service: Open Heart Surgery;  Laterality: N/A;  . TOTAL KNEE ARTHROPLASTY     right  . TRANSCATHETER AORTIC VALVE REPLACEMENT, TRANSFEMORAL Left 06/10/2015   Procedure: TRANSCATHETER AORTIC VALVE REPLACEMENT, TRANSFEMORAL;  Surgeon: Burnell Blanks, MD;  Location: [place];  Service: Open Heart Surgery;  Laterality: Left;    SOCIAL HISTORY: Social History   Tobacco Use  . Smoking status: Former Smoker    Years: 30.00    Types: Pipe    Last attempt to quit: 09/20/2006    Years since quitting: 11.8  . Smokeless tobacco: Never Used  Substance Use Topics  . Alcohol use: No    Alcohol/week: 0.0 standard drinks  . Drug use: No    FAMILY HISTORY: Family History  Problem Relation Age of Onset  . Arthritis Sister        "crippling"  . Arthritis Sister     ROS: Review of Systems  Constitutional: Negative for weight loss.  Cardiovascular: Negative for chest pain and claudication.  Musculoskeletal: Negative for myalgias.    PHYSICAL EXAM: Blood pressure (!) 142/67, pulse 71, temperature 98.1 F (36.7 C), height 6\' 2"  (1.88 m), weight (!) 358 lb (162.4 kg), SpO2 92 %. Body mass index is 45.96 kg/m. Physical Exam  Constitutional: He is oriented to person, place, and time. He appears well-developed and well-nourished.  Cardiovascular: Normal rate.  Pulmonary/Chest: Effort normal.  Musculoskeletal: Normal range of motion.  Neurological: He is alert and oriented to person, place, and time.  Skin: Skin is warm and dry.  Psychiatric: He has a normal mood and affect. His behavior is normal.  Vitals reviewed.   RECENT LABS AND TESTS: BMET    Component Value Date/Time   NA 140 06/29/2018 1146   NA 138 04/11/2018 1030   K 4.6 06/29/2018 1146   CL 98 06/29/2018 1146   CO2 38 (H) 06/29/2018 1146   GLUCOSE 91 06/29/2018 1146   BUN 32 (H) 06/29/2018 1146   BUN 66 (H) 04/11/2018 1030   CREATININE 1.07 06/29/2018 1146   CREATININE 1.19 02/28/2015 1548   CALCIUM 9.4 06/29/2018 1146   GFRNONAA 55 (L) 04/26/2018 1802   GFRAA >60 04/26/2018 1802   Lab Results  Component Value Date   HGBA1C 5.5 04/11/2018   HGBA1C 5.5 12/06/2017   HGBA1C 5.8 11/24/2017   HGBA1C 6.0 07/28/2017   HGBA1C 5.7 (H) 06/06/2015   Lab Results  Component Value Date    INSULIN 15.2 04/11/2018   INSULIN 21.5 12/06/2017   CBC    Component Value Date/Time   WBC 8.6 06/29/2018 1146   RBC 4.00 (L) 06/29/2018 1146   HGB 12.1 (L) 06/29/2018 1146   HCT 37.6 (L) 06/29/2018 1146   PLT 157.0 06/29/2018 1146   MCV 93.9 06/29/2018 1146   MCH 29.7 04/26/2018 1802   MCHC 32.1 06/29/2018 1146   RDW 16.7 (H) 06/29/2018 1146   LYMPHSABS 1.3 07/28/2017 1628   MONOABS 0.5 07/28/2017 1628   EOSABS 0.1 07/28/2017 1628   BASOSABS 0.1 07/28/2017 1628   Iron/TIBC/Ferritin/ %Sat No results found for: IRON, TIBC, FERRITIN, IRONPCTSAT Lipid Panel     Component Value Date/Time   CHOL 110 04/11/2018 1030   TRIG 87 04/11/2018 1030   HDL 32 (L) 04/11/2018 1030   CHOLHDL 4 11/24/2017 1633   VLDL 29.6 11/24/2017 1633   LDLCALC 61 04/11/2018 1030   LDLDIRECT 85.0 07/28/2017 1628   Hepatic Function Panel     Component Value Date/Time   PROT 6.3 06/29/2018 1146   PROT 5.9 (L) 04/11/2018 1030   ALBUMIN 3.7 06/29/2018 1146   ALBUMIN 3.5 04/11/2018 1030   AST 18 06/29/2018 1146   ALT 18 06/29/2018 1146   ALKPHOS 76 06/29/2018 1146   BILITOT 0.4 06/29/2018 1146   BILITOT 0.4 04/11/2018 1030   BILIDIR 0.1 02/21/2015 0921   IBILI 0.4 04/19/2014 1418      Component Value Date/Time   TSH 1.71 11/24/2017 1633   TSH 1.30 07/28/2017 1628   TSH 1.18 01/25/2017 1415   Results for KLINE, BULTHUIS (MRN [id number]) as of 07/19/2018 14:34  Ref. Range 12/06/2017 09:54  Vitamin D, 25-Hydroxy Latest Ref Range: 30.0 - 100.0 ng/mL 47.9    ASSESSMENT AND PLAN: Other hyperlipidemia  Class 3 severe obesity with serious comorbidity and body mass index (BMI) of 45.0 to 49.9 in adult, unspecified obesity type ([place])  PLAN: Hyperlipidemia Noor was informed of the American Heart Association Guidelines emphasizing intensive lifestyle modifications as  the first line treatment for hyperlipidemia. We discussed many lifestyle modifications today in depth. Bascom will continue Lipitor and  get back on his diet plan. English will work on decreasing saturated fats such as fatty red meat, butter and many fried foods. He will also increase vegetables and lean protein in his diet and continue to work on exercise and weight loss efforts.  Obesity Momen is currently in the action stage of change. As such, his goal is to continue with weight loss efforts He has agreed to follow the Category 3 [place] has been instructed to work up to a goal of 150 minutes of combined cardio and strengthening exercise per week for weight loss and overall health benefits. We discussed the following Behavioral Modification Stratagies today: increasing lean protein intake, decreasing simple carbohydrates , decrease eating out and work on meal planning and easy [place] has agreed to follow up with our clinic in 2 weeks. He was informed of the importance of frequent follow up visits to maximize his success with intensive lifestyle modifications for his multiple health conditions.  I spent > than 50% of the 15 minute visit on counseling as documented in the note.  OBESITY BEHAVIORAL INTERVENTION VISIT  Today's visit was # 11   Starting weight: 385 lbs Starting date: 12/06/2017 Today's weight : Weight: (!) 358 lb (162.4 kg)  Today's date: 07/18/2018 Total lbs lost to date: 36 lbs   ASK: We discussed the diagnosis of obesity with [name] today and Jerrik agreed to give [name] permission to discuss obesity behavioral modification therapy today.  ASSESS: Mustaf has the diagnosis of obesity and his BMI today is 45.94 Zyire is in the action stage of change   ADVISE: [place] was educated on the multiple health risks of obesity as well as the benefit of weight loss to improve his health. He was advised of the need for long term treatment and the importance of lifestyle modifications to improve his current health and to decrease his risk of future health problems.  AGREE: Multiple dietary  modification options and treatment options were discussed and  Eliodoro agreed to follow the recommendations documented in the above note.  ARRANGE: Krystian was educated on the importance of frequent visits to treat obesity as outlined per CMS and USPSTF guidelines and agreed to schedule his next follow up appointment today.  I, Remi Deter, CMA, am acting as transcriptionist for Dennard Nip, MD  I have reviewed the above documentation for accuracy and completeness, and I agree with the above. -Dennard Nip, MD

## 2018-07-22 DIAGNOSIS — R0602 Shortness of breath: Secondary | ICD-10-CM | POA: Diagnosis not present

## 2018-07-22 DIAGNOSIS — J449 Chronic obstructive pulmonary disease, unspecified: Secondary | ICD-10-CM | POA: Diagnosis not present

## 2018-07-22 DIAGNOSIS — J9622 Acute and chronic respiratory failure with hypercapnia: Secondary | ICD-10-CM | POA: Diagnosis not present

## 2018-07-22 DIAGNOSIS — G4733 Obstructive sleep apnea (adult) (pediatric): Secondary | ICD-10-CM | POA: Diagnosis not present

## 2018-07-24 ENCOUNTER — Ambulatory Visit (INDEPENDENT_AMBULATORY_CARE_PROVIDER_SITE_OTHER): Payer: PPO | Admitting: Medical

## 2018-07-24 ENCOUNTER — Encounter: Payer: Self-pay | Admitting: Medical

## 2018-07-24 VITALS — BP 136/69 | HR 64 | Temp 98.1°F | Resp 16

## 2018-07-24 DIAGNOSIS — J0110 Acute frontal sinusitis, unspecified: Secondary | ICD-10-CM | POA: Diagnosis not present

## 2018-07-24 DIAGNOSIS — R05 Cough: Secondary | ICD-10-CM

## 2018-07-24 DIAGNOSIS — J40 Bronchitis, not specified as acute or chronic: Secondary | ICD-10-CM | POA: Diagnosis not present

## 2018-07-24 DIAGNOSIS — R059 Cough, unspecified: Secondary | ICD-10-CM

## 2018-07-24 MED ORDER — FLUTICASONE PROPIONATE 50 MCG/ACT NA SUSP: 2.0000 | Freq: Every day | NASAL | 1 refills | Status: DC

## 2018-07-24 MED ORDER — DOXYCYCLINE HYCLATE 100 MG PO TABS: 100.0000 mg | ORAL_TABLET | Freq: Two times a day (BID) | ORAL | 0 refills | Status: DC

## 2018-07-24 MED ORDER — BENZONATATE 100 MG PO CAPS: 100.0000 mg | ORAL_CAPSULE | Freq: Three times a day (TID) | ORAL | 0 refills | Status: DC | PRN

## 2018-07-24 NOTE — Patient Instructions (Signed)
You appear to have bronchitis and sinusitis. Rest hydrate and tylenol for fever. I am prescribing cough medicine benzaonatate, and doxycycline antibiotic. For your nasal congestion flonase.  You should gradually get better. If not then notify us and would recommend a chest xray.  Follow up in 7-10 days or as needed

## 2018-07-24 NOTE — Progress Notes (Signed)
Subjective:    Patient ID: [name], male    DOB: [birth year], 77 y.o.   MRN: [id number]  HPI  Pt in for recent nasal congestion, pnd, faint sinus pressure and some chest congestion.  Symptoms for about 2 weeks. But not as severe. Mild symptoms on wellness exam but this past week above symptoms worsened.  Some intermittent productive cough and some mucus if he blows his nose.  Review of Systems  Constitutional: Negative for chills, fatigue and fever.  HENT: Positive for congestion, postnasal drip and sinus pressure. Negative for sinus pain.   Respiratory: Positive for cough. Negative for chest tightness, wheezing and stridor.   Cardiovascular: Negative for chest pain and palpitations.  Gastrointestinal: Negative for abdominal pain.  Musculoskeletal: Negative for back pain, myalgias and neck pain.  Skin: Negative for rash.  Neurological: Negative for dizziness, tremors, speech difficulty, weakness and headaches.  Hematological: Negative for adenopathy. Does not bruise/bleed easily.  Psychiatric/Behavioral: Negative for behavioral problems and confusion.    Past Medical History:  Diagnosis Date  . Anemia   . Aortic stenosis   . Aortic stenosis, severe    S/p Edwards Sapien 3 Transcatheter Heart Valve (size 26 mm, model # [id number], serial # [id number])  . Arthritis   . Back pain   . Bursitis   . Cataract    left immature  . Cellulitis 10/12/2015  . Complication of anesthesia    Halucinations  . Constipation    takes Miralax daily as well as Senokot daily  . DDD (degenerative disc disease)   . Gout    takes Allopurinol daily  . Heart valve disorder   . History of blood clots 1962   knee  . History of blood transfusion    no abnormal reaction noted  . History of shingles   . Hyperlipidemia    takes Atorvastatin daily  . Hypertension    takes Lisinopril daily  . Joint pain   . Low back pain 01/25/2017  . Neck pain on left side 01/25/2017  . OSA (obstructive sleep  apnea)   . Osteoarthritis   . Peripheral edema    takes Furosemide daily  . Peripheral neuropathy    takes Gabapentin daily  . Peroneal palsy    significant right foot drop  . Pneumonia 25+yrs ago   hx of  . Shortness of breath   . Swelling of extremity   . Urinary frequency   . Urinary urgency   . Valvular heart disease      Social History   Socioeconomic History  . Marital status: Married    Spouse name: [name]  . Number of children: 3  . Years of education: Not on file  . Highest education level: Not on file  Occupational History  . Occupation: retired Investment banker, operational  . Financial resource strain: Not on file  . Food insecurity:    Worry: Not on file    Inability: Not on file  . Transportation needs:    Medical: Not on file    Non-medical: Not on file  Tobacco Use  . Smoking status: Former Smoker    Years: 30.00    Types: Pipe    Last attempt to quit: 09/20/2006    Years since quitting: 11.8  . Smokeless tobacco: Never Used  Substance and Sexual Activity  . Alcohol use: No    Alcohol/week: 0.0 standard drinks  . Drug use: No  . Sexual activity: Never    Comment: lives  with wife, retired, no dietary restrictions  Lifestyle  . Physical activity:    Days per week: Not on file    Minutes per session: Not on file  . Stress: Not on file  Relationships  . Social connections:    Talks on phone: Not on file    Gets together: Not on file    Attends religious service: Not on file    Active member of club or organization: Not on file    Attends meetings of clubs or organizations: Not on file    Relationship status: Not on file  . Intimate partner violence:    Fear of current or ex partner: Not on file    Emotionally abused: Not on file    Physically abused: Not on file    Forced sexual activity: Not on file  Other Topics Concern  . Not on file  Social History Narrative  . Not on file    Past Surgical History:  Procedure Laterality Date  .  CARDIAC CATHETERIZATION N/A 05/02/2015   Procedure: Right/Left Heart Cath and Coronary Angiography;  Surgeon: Burnell Blanks, MD;  Location: [place] [facility];  Service: Cardiovascular;  Laterality: N/A;  . COLONOSCOPY N/A 02/07/2013   Procedure: COLONOSCOPY;  Surgeon: Juanita Craver, MD;  Location: [facility] ENDOSCOPY;  Service: Endoscopy;  Laterality: N/A;  . COLONOSCOPY N/A 02/09/2013   Procedure: COLONOSCOPY;  Surgeon: Beryle Beams, MD;  Location: [place];  Service: Endoscopy;  Laterality: N/A;  . ESOPHAGOGASTRODUODENOSCOPY N/A 02/07/2013   Procedure: ESOPHAGOGASTRODUODENOSCOPY (EGD);  Surgeon: Juanita Craver, MD;  Location: [facility] ENDOSCOPY;  Service: Endoscopy;  Laterality: N/A;  . GIVENS CAPSULE STUDY N/A 02/09/2013   Procedure: GIVENS CAPSULE STUDY;  Surgeon: Beryle Beams, MD;  Location: [place];  Service: Endoscopy;  Laterality: N/A;  . HERNIA REPAIR     umbilical hernia  . KNEE SURGERY Right    multiple knee surgeries due to complication of R TKA  . LAMINOTOMY  9937   c6-t2  . PACEMAKER IMPLANT N/A 04/27/2018   Procedure: PACEMAKER IMPLANT;  Surgeon: Constance Haw, MD;  Location: [place] [facility];  Service: Cardiovascular;  Laterality: N/A;  . SPINE SURGERY    . TEE WITHOUT CARDIOVERSION N/A 03/07/2015   Procedure: TRANSESOPHAGEAL ECHOCARDIOGRAM (TEE);  Surgeon: Josue Hector, MD;  Location: [facility] ENDOSCOPY;  Service: Cardiovascular;  Laterality: N/A;  ANES TO BRING PROPOFOL PER DOCTOR  . TEE WITHOUT CARDIOVERSION N/A 06/10/2015   Procedure: TRANSESOPHAGEAL ECHOCARDIOGRAM (TEE);  Surgeon: Burnell Blanks, MD;  Location: [place];  Service: Open Heart Surgery;  Laterality: N/A;  . TOTAL KNEE ARTHROPLASTY     right  . TRANSCATHETER AORTIC VALVE REPLACEMENT, TRANSFEMORAL Left 06/10/2015   Procedure: TRANSCATHETER AORTIC VALVE REPLACEMENT, TRANSFEMORAL;  Surgeon: Burnell Blanks, MD;  Location: [place];  Service: Open Heart Surgery;  Laterality: Left;    Family History    Problem Relation Age of Onset  . Arthritis Sister        "crippling"  . Arthritis Sister     Allergies  Allergen Reactions  . Coumadin [Warfarin Sodium] Other (See Comments)    States he can't be on this-bleeds out    Current Outpatient Medications on File Prior to Visit  Medication Sig Dispense Refill  . acetaminophen (TYLENOL) 500 MG tablet Take 1,000 mg by mouth 2 (two) times daily.     [name] aspirin 81 MG EC tablet TAKE 1 TABLET (81 MG TOTAL) BY MOUTH DAILY. 30 tablet 11  .  atorvastatin (LIPITOR) 40 MG tablet Take 1 and a half tablet daily by mouth  (60mg ) 135 tablet 5  . cholecalciferol (VITAMIN D) 1000 UNITS tablet Take 2,000 Units by mouth daily.     . clopidogrel (PLAVIX) 75 MG tablet TAKE 1 TABLET (75 MG TOTAL) BY MOUTH DAILY WITH BREAKFAST. 90 tablet 3  . Coenzyme Q10 (COQ10) 200 MG CAPS Take 200 mg by mouth daily.    . Ferrous Fumarate-Folic Acid (HEMATINIC/FOLIC ACID) 638-4 MG TABS Take 1 tablet by mouth 2 (two) times a week. 90 each 1  . furosemide (LASIX) 20 MG tablet Take 1 tablet (20 mg total) by mouth 2 (two) times daily. Please keep upcoming appt in September for future refills. Thank you 180 tablet 0  . gabapentin (NEURONTIN) 300 MG capsule Take 1 capsule (300 mg total) by mouth 3 (three) times daily. 270 capsule 2  . Misc Natural Products (TART CHERRY ADVANCED PO) Take 1 capsule by mouth daily.    . Multiple Vitamin (MULTIVITAMIN WITH MINERALS) TABS Take 1 tablet by mouth daily.    . polyethylene glycol (MIRALAX / GLYCOLAX) packet Take 17 g by mouth daily.    . sennosides-docusate sodium (SENOKOT-S) 8.6-50 MG tablet Take 1 tablet by mouth 2 (two) times daily.     No current facility-administered medications on file prior to visit.     There were no vitals taken for this visit.      Objective:   Physical Exam  General  Mental Status - Alert. General Appearance - Well groomed. Not in acute distress.  Skin Rashes- No Rashes.  HEENT Head-  Normal. Ear Auditory Canal - Left- Normal. Right - Normal.Tympanic Membrane- Left- Normal. Right- Normal. Eye Sclera/Conjunctiva- Left- Normal. Right- Normal. Nose & Sinuses Nasal Mucosa- Left-  Boggy and Congested. Right-  Boggy and  Congested.Bilateral no  maxillary but  frontal sinus pressure. Mouth & Throat Lips: Upper Lip- Normal: no dryness, cracking, pallor, cyanosis, or vesicular eruption. Lower Lip-Normal: no dryness, cracking, pallor, cyanosis or vesicular eruption. Buccal Mucosa- Bilateral- No Aphthous ulcers. Oropharynx- No Discharge or Erythema. Tonsils: Characteristics- Bilateral- No Erythema or Congestion. Size/Enlargement- Bilateral- No enlargement. Discharge- bilateral-None.  Neck Neck- Supple. No Masses.   Chest and Lung Exam Auscultation: Breath Sounds:- even and unlabored. But mild shallow respiration.   Cardiovascular Auscultation:Rythm- Regular, rate and rhythm. Murmurs & Other Heart Sounds:Ausculatation of the heart reveal- No Murmurs.  Lymphatic Head & Neck General Head & Neck Lymphatics: Bilateral: Description- No Localized lymphadenopathy.       Assessment & Plan:   You appear to have bronchitis and sinusitis. Rest hydrate and tylenol for fever. I am prescribing cough medicine benzaonatate, and doxycycline antibiotic. For your nasal congestion flonase.  You should gradually get better. If not then notify us and would recommend a chest xray.  Follow up in 7-10 days or as needed

## 2018-08-01 ENCOUNTER — Ambulatory Visit (INDEPENDENT_AMBULATORY_CARE_PROVIDER_SITE_OTHER): Payer: PPO | Admitting: Cardiology

## 2018-08-01 ENCOUNTER — Encounter: Payer: Self-pay | Admitting: Cardiology

## 2018-08-01 VITALS — BP 126/72 | HR 65 | Ht 74.0 in | Wt 358.0 lb

## 2018-08-01 DIAGNOSIS — I441 Atrioventricular block, second degree: Secondary | ICD-10-CM

## 2018-08-01 DIAGNOSIS — I10 Essential (primary) hypertension: Secondary | ICD-10-CM | POA: Diagnosis not present

## 2018-08-01 DIAGNOSIS — I350 Nonrheumatic aortic (valve) stenosis: Secondary | ICD-10-CM | POA: Diagnosis not present

## 2018-08-01 NOTE — Progress Notes (Signed)
Electrophysiology Office Note   Date:  08/01/2018   ID:  [name], DOB [birth year], MRN [id number]  PCP:  [name], MD  Cardiologist:  [name] Primary Electrophysiologist:  [name] [name], MD    No chief complaint on file.    History of Present Illness: [name] is a 77 y.o. male who is being seen today for the evaluation of 2:1 AV block, pacemaker at the request of [name], MD. Presenting today for electrophysiology evaluation.  He has a history of aortic stenosis status post TAVR who presented to the hospital August 2019 with acute onset of weakness, and dizziness.  He was found to have Mobitz 1 AV block as well as 2-1 AV block with heart rates in the 30s to 40s.  He underwent implant of a Saint Jude dual-chamber pacemaker on 04/27/2018.  Today, he denies symptoms of palpitations, chest pain, shortness of breath, orthopnea, PND, lower extremity edema, claudication, dizziness, presyncope, syncope, bleeding, or neurologic sequela. The patient is tolerating medications without difficulties.  Overall he is doing well.  He has no chest pain or shortness of breath.  He is feeling much improved since his pacemaker was implanted and has had no symptoms of weakness or fatigue.   Past Medical History:  Diagnosis Date  . Anemia   . Aortic stenosis   . Aortic stenosis, severe    S/p Edwards Sapien 3 Transcatheter Heart Valve (size 26 mm, model # [id number], serial # [id number])  . Arthritis   . Back pain   . Bursitis   . Cataract    left immature  . Cellulitis 10/12/2015  . Complication of anesthesia    Halucinations  . Constipation    takes Miralax daily as well as Senokot daily  . DDD (degenerative disc disease)   . Gout    takes Allopurinol daily  . Heart valve disorder   . History of blood clots 1962   knee  . History of blood transfusion    no abnormal reaction noted  . History of shingles   . Hyperlipidemia    takes Atorvastatin daily  .  Hypertension    takes Lisinopril daily  . Joint pain   . Low back pain 01/25/2017  . Neck pain on left side 01/25/2017  . OSA (obstructive sleep apnea)   . Osteoarthritis   . Peripheral edema    takes Furosemide daily  . Peripheral neuropathy    takes Gabapentin daily  . Peroneal palsy    significant right foot drop  . Pneumonia 25+yrs ago   hx of  . Shortness of breath   . Swelling of extremity   . Urinary frequency   . Urinary urgency   . Valvular heart disease    Past Surgical History:  Procedure Laterality Date  . CARDIAC CATHETERIZATION N/A 05/02/2015   Procedure: Right/Left Heart Cath and Coronary Angiography;  Surgeon: Burnell Blanks, MD;  Location: [place] [facility];  Service: Cardiovascular;  Laterality: N/A;  . COLONOSCOPY N/A 02/07/2013   Procedure: COLONOSCOPY;  Surgeon: Juanita Craver, MD;  Location: [facility] ENDOSCOPY;  Service: Endoscopy;  Laterality: N/A;  . COLONOSCOPY N/A 02/09/2013   Procedure: COLONOSCOPY;  Surgeon: Beryle Beams, MD;  Location: [place];  Service: Endoscopy;  Laterality: N/A;  . ESOPHAGOGASTRODUODENOSCOPY N/A 02/07/2013   Procedure: ESOPHAGOGASTRODUODENOSCOPY (EGD);  Surgeon: Juanita Craver, MD;  Location: [facility] ENDOSCOPY;  Service: Endoscopy;  Laterality: N/A;  . GIVENS CAPSULE STUDY N/A 02/09/2013   Procedure: GIVENS  CAPSULE STUDY;  Surgeon: Beryle Beams, MD;  Location: [facility] ENDOSCOPY;  Service: Endoscopy;  Laterality: N/A;  . HERNIA REPAIR     umbilical hernia  . KNEE SURGERY Right    multiple knee surgeries due to complication of R TKA  . LAMINOTOMY  7616   c6-t2  . PACEMAKER IMPLANT N/A 04/27/2018   Procedure: PACEMAKER IMPLANT;  Surgeon: Constance Haw, MD;  Location: [place] [facility];  Service: Cardiovascular;  Laterality: N/A;  . SPINE SURGERY    . TEE WITHOUT CARDIOVERSION N/A 03/07/2015   Procedure: TRANSESOPHAGEAL ECHOCARDIOGRAM (TEE);  Surgeon: Josue Hector, MD;  Location: [facility] ENDOSCOPY;  Service: Cardiovascular;  Laterality:  N/A;  ANES TO BRING PROPOFOL PER DOCTOR  . TEE WITHOUT CARDIOVERSION N/A 06/10/2015   Procedure: TRANSESOPHAGEAL ECHOCARDIOGRAM (TEE);  Surgeon: Burnell Blanks, MD;  Location: [place];  Service: Open Heart Surgery;  Laterality: N/A;  . TOTAL KNEE ARTHROPLASTY     right  . TRANSCATHETER AORTIC VALVE REPLACEMENT, TRANSFEMORAL Left 06/10/2015   Procedure: TRANSCATHETER AORTIC VALVE REPLACEMENT, TRANSFEMORAL;  Surgeon: Burnell Blanks, MD;  Location: [place];  Service: Open Heart Surgery;  Laterality: Left;     Current Outpatient Medications  Medication Sig Dispense Refill  . acetaminophen (TYLENOL) 500 MG tablet Take 1,000 mg by mouth 2 (two) times daily.     [name] aspirin 81 MG EC tablet TAKE 1 TABLET (81 MG TOTAL) BY MOUTH DAILY. 30 tablet 11  . atorvastatin (LIPITOR) 40 MG tablet Take 1 and a half tablet daily by mouth  (31m) 135 tablet 5  . benzonatate (TESSALON) 100 MG capsule Take 1 capsule (100 mg total) by mouth 3 (three) times daily as needed for cough. 30 capsule 0  . cholecalciferol (VITAMIN D) 1000 UNITS tablet Take 2,000 Units by mouth daily.     . clopidogrel (PLAVIX) 75 MG tablet TAKE 1 TABLET (75 MG TOTAL) BY MOUTH DAILY WITH BREAKFAST. 90 tablet 3  . Coenzyme Q10 (COQ10) 200 MG CAPS Take 200 mg by mouth daily.    .Marland Kitchendoxycycline (VIBRA-TABS) 100 MG tablet Take 1 tablet (100 mg total) by mouth 2 (two) times daily. Can give caps or generic 20 tablet 0  . Ferrous Fumarate-Folic Acid (HEMATINIC/FOLIC ACID) 3073-7MG TABS Take 1 tablet by mouth 2 (two) times a week. 90 each 1  . fluticasone (FLONASE) 50 MCG/ACT nasal spray Place 2 sprays into both nostrils daily. 16 g 1  . furosemide (LASIX) 20 MG tablet Take 1 tablet (20 mg total) by mouth 2 (two) times daily. Please keep upcoming appt in September for future refills. Thank you 180 tablet 0  . gabapentin (NEURONTIN) 300 MG capsule Take 1 capsule (300 mg total) by mouth 3 (three) times daily. 270 capsule 2  . Misc Natural Products  (TART CHERRY ADVANCED PO) Take 1 capsule by mouth daily.    . Multiple Vitamin (MULTIVITAMIN WITH MINERALS) TABS Take 1 tablet by mouth daily.    . polyethylene glycol (MIRALAX / GLYCOLAX) packet Take 17 g by mouth daily.    . sennosides-docusate sodium (SENOKOT-S) 8.6-50 MG tablet Take 1 tablet by mouth 2 (two) times daily.     No current facility-administered medications for this visit.     Allergies:   Coumadin [warfarin sodium]   Social History:  The patient  reports that he quit smoking about 11 years ago. His smoking use included pipe. He quit after 30.00 years of use. He has never used smokeless tobacco. He reports  that he does not drink alcohol or use drugs.   Family History:  The patient's family history includes Arthritis in his sister and sister; Other in his father.    ROS:  Please see the history of present illness.   Otherwise, review of systems is positive for cough, walking problems, easy bruising.   All other systems are reviewed and negative.    PHYSICAL EXAM: VS:  BP 126/72   Pulse 65   Ht _0  (1.88 m)   Wt (!) 358 lb (162.4 kg)   BMI 45.96 kg/m  , BMI Body mass index is 45.96 kg/m. GEN: Well nourished, well developed, in no acute distress  HEENT: normal  Neck: no JVD, carotid bruits, or masses Cardiac: RRR; no murmurs, rubs, or gallops,no edema  Respiratory:  clear to auscultation bilaterally, normal work of breathing GI: soft, nontender, nondistended, + BS MS: no deformity or atrophy  Skin: warm and dry, device pocket is well healed Neuro:  Strength and sensation are intact Psych: euthymic mood, full affect  EKG:  EKG is ordered today. Personal review of the ekg ordered shows sinus rhythm, ventricular paced  Device interrogation is reviewed today in detail.  See PaceArt for details.   Recent Labs: 11/24/2017: TSH 1.71 06/29/2018: ALT 18; BUN 32; Creatinine, Ser 1.07; Hemoglobin 12.1; Platelets 157.0; Potassium 4.6; Sodium 140    Lipid Panel       Component Value Date/Time   CHOL 110 04/11/2018 1030   TRIG 87 04/11/2018 1030   HDL 32 (L) 04/11/2018 1030   CHOLHDL 4 11/24/2017 1633   VLDL 29.6 11/24/2017 1633   LDLCALC 61 04/11/2018 1030   LDLDIRECT 85.0 07/28/2017 1628     Wt Readings from Last 3 Encounters:  08/01/18 (!) 358 lb (162.4 kg)  07/18/18 (!) 358 lb (162.4 kg)  07/06/18 (!) 348 lb (157.9 kg)      Other studies Reviewed: Additional studies/ records that were reviewed today include: TTE 04/27/18  Review of the above records today demonstrates:  - Left ventricle: The cavity size was normal. Wall thickness was   increased in a pattern of moderate LVH. Systolic function was   normal. The estimated ejection fraction was in the range of 60%   to 65%. Wall motion was normal; there were no regional wall   motion abnormalities. The study is not technically sufficient to   allow evaluation of LV diastolic function. - Aortic valve: s/p TAVR. Mild paravalvular leak. Mean gradient   (S): 22 mm Hg. Peak gradient (S): 42 mm Hg. Valve area (VTI): 1.6   cm^2. Valve area (Vmax): 1.76 cm^2. - Mitral valve: Heavy MAC. Moderate mitral stenosis. Trivial   regurgitation. Mean gradient (D): 13 mm Hg. Valve area by   pressure half-time: 1.17 cm^2. - Left atrium: Severely dilated. - Right ventricle: The cavity size was mildly dilated. - Right atrium: Severely dilated. - Systemic veins: The IVC measures >2.1 cm, but collapses more than   50%, suggesting an elevated RA pressure of 8 mmHg.   ASSESSMENT AND PLAN:  1.  2-1 AV block: Post Saint Jude dual-chamber pacemaker 04/27/2018.  His device is functioning appropriately.  He has AV pacing 100% of the time.  He does have underlying rhythm.  We Jae Skeet push out his AV delays so that he has a chance to conduct.  2.  Aortic stenosis: Status post TAVR.  Plan per primary cardiology.  3.  Hypertension: Well-controlled today.  No changes.  Current medicines are reviewed at  length with the  patient today.   The patient does not have concerns regarding his medicines.  The following changes were made today:  none  Labs/ tests ordered today include:  Orders Placed This Encounter  Procedures  . EKG 12-Lead     Disposition:   FU with Rever Pichette 9 months  Signed, Sekou Zuckerman [name], MD  08/01/2018 11:45 AM     [facility] [address] [place] [address] [phone] (office) [phone] (fax)

## 2018-08-01 NOTE — Patient Instructions (Signed)
Medication Instructions:  Your physician recommends that you continue on your current medications as directed. Please refer to the Current Medication list given to you today.  *If you need a refill on your cardiac medications before your next appointment, please call your pharmacy*  Labwork: None ordered  Testing/Procedures: None ordered  Follow-Up: Remote monitoring is used to monitor your Pacemaker or ICD from home. This monitoring reduces the number of office visits required to check your device to one time per year. It allows [name] to keep an eye on the functioning of your device to ensure it is working properly. You are scheduled for a device check from home on 10/31/2018. You may send your transmission at any time that day. If you have a wireless device, the transmission will be sent automatically. After your physician reviews your transmission, you will receive a postcard with your next transmission date.  Your physician wants you to follow-up in: 9 months with Dr. Curt Bears.  You will receive a reminder letter in the mail two months in advance. If you don't receive a letter, please call our office to schedule the follow-up appointment.  Thank you for choosing [facility]!!   Trinidad Curet, RN [phone]

## 2018-08-03 ENCOUNTER — Encounter (INDEPENDENT_AMBULATORY_CARE_PROVIDER_SITE_OTHER): Payer: Self-pay | Admitting: Family Medicine

## 2018-08-03 ENCOUNTER — Ambulatory Visit (INDEPENDENT_AMBULATORY_CARE_PROVIDER_SITE_OTHER): Payer: PPO | Admitting: Family Medicine

## 2018-08-03 VITALS — BP 114/76 | HR 97 | Temp 98.1°F | Ht 74.0 in | Wt 356.0 lb

## 2018-08-03 DIAGNOSIS — E8881 Metabolic syndrome: Secondary | ICD-10-CM

## 2018-08-03 DIAGNOSIS — Z6841 Body Mass Index (BMI) 40.0 and over, adult: Secondary | ICD-10-CM | POA: Diagnosis not present

## 2018-08-03 DIAGNOSIS — I10 Essential (primary) hypertension: Secondary | ICD-10-CM | POA: Diagnosis not present

## 2018-08-08 ENCOUNTER — Encounter (INDEPENDENT_AMBULATORY_CARE_PROVIDER_SITE_OTHER): Payer: Self-pay | Admitting: Family Medicine

## 2018-08-08 NOTE — Progress Notes (Signed)
Office: [phone]  /  Fax: [phone]   HPI:   Chief Complaint: [name] is here to discuss his progress with his obesity treatment plan. He is on the Category 3 plan and is following his eating plan approximately 75% of the time. He states he is walking for 10 minutes 5 times per week. Riot has indulged recently during family celebrations. He feels he does better with Category meal plan than journaling.  His weight is (!) 356 lb (161.5 kg) today and has had a weight loss of 2 pounds over a period of 2 to 3 weeks since his last visit. He has lost 29 lbs since starting treatment with [name].  Insulin Resistance Horton has a diagnosis of insulin resistance based on his elevated fasting insulin level >5. Although Donavon's blood glucose readings are still under good control, insulin resistance puts him at greater risk of metabolic syndrome and diabetes. He is not taking metformin currently and continues to work on diet and exercise to decrease risk of diabetes. He denies polyphagia.  Hypertension ABDELAZIZ WESTENBERGER is a 77 y.o. male with hypertension. Bernhardt's blood pressure is well controlled. He denies chest pain, shortness of breath, or headaches. He is working weight loss to help control his blood pressure with the goal of decreasing his risk of heart attack and stroke.   ALLERGIES: Allergies  Allergen Reactions  . Coumadin [Warfarin Sodium] Other (See Comments)    States he can't be on this-bleeds out    MEDICATIONS: Current Outpatient Medications on File Prior to Visit  Medication Sig Dispense Refill  . acetaminophen (TYLENOL) 500 MG tablet Take 1,000 mg by mouth 2 (two) times daily.     [name] aspirin 81 MG EC tablet TAKE 1 TABLET (81 MG TOTAL) BY MOUTH DAILY. 30 tablet 11  . atorvastatin (LIPITOR) 40 MG tablet Take 1 and a half tablet daily by mouth  (60mg ) 135 tablet 5  . benzonatate (TESSALON) 100 MG capsule Take 1 capsule (100 mg total) by mouth 3 (three) times daily as needed for  cough. 30 capsule 0  . cholecalciferol (VITAMIN D) 1000 UNITS tablet Take 2,000 Units by mouth daily.     . clopidogrel (PLAVIX) 75 MG tablet TAKE 1 TABLET (75 MG TOTAL) BY MOUTH DAILY WITH BREAKFAST. 90 tablet 3  . Coenzyme Q10 (COQ10) 200 MG CAPS Take 200 mg by mouth daily.    [name] doxycycline (VIBRA-TABS) 100 MG tablet Take 1 tablet (100 mg total) by mouth 2 (two) times daily. Can give caps or generic 20 tablet 0  . Ferrous Fumarate-Folic Acid (HEMATINIC/FOLIC ACID) 831-5 MG TABS Take 1 tablet by mouth 2 (two) times a week. 90 each 1  . fluticasone (FLONASE) 50 MCG/ACT nasal spray Place 2 sprays into both nostrils daily. 16 g 1  . furosemide (LASIX) 20 MG tablet Take 1 tablet (20 mg total) by mouth 2 (two) times daily. Please keep upcoming appt in September for future refills. Thank you 180 tablet 0  . gabapentin (NEURONTIN) 300 MG capsule Take 1 capsule (300 mg total) by mouth 3 (three) times daily. 270 capsule 2  . Misc Natural Products (TART CHERRY ADVANCED PO) Take 1 capsule by mouth daily.    . Multiple Vitamin (MULTIVITAMIN WITH MINERALS) TABS Take 1 tablet by mouth daily.    . polyethylene glycol (MIRALAX / GLYCOLAX) packet Take 17 g by mouth daily.    . sennosides-docusate sodium (SENOKOT-S) 8.6-50 MG tablet Take 1 tablet by mouth 2 (two) times daily.  No current facility-administered medications on file prior to visit.     PAST MEDICAL HISTORY: Past Medical History:  Diagnosis Date  . Anemia   . Aortic stenosis   . Aortic stenosis, severe    S/p Edwards Sapien 3 Transcatheter Heart Valve (size 26 mm, model # [id number], serial # [id number])  . Arthritis   . Back pain   . Bursitis   . Cataract    left immature  . Cellulitis 10/12/2015  . Complication of anesthesia    Halucinations  . Constipation    takes Miralax daily as well as Senokot daily  . DDD (degenerative disc disease)   . Gout    takes Allopurinol daily  . Heart valve disorder   . History of blood clots 1962    knee  . History of blood transfusion    no abnormal reaction noted  . History of shingles   . Hyperlipidemia    takes Atorvastatin daily  . Hypertension    takes Lisinopril daily  . Joint pain   . Low back pain 01/25/2017  . Neck pain on left side 01/25/2017  . OSA (obstructive sleep apnea)   . Osteoarthritis   . Peripheral edema    takes Furosemide daily  . Peripheral neuropathy    takes Gabapentin daily  . Peroneal palsy    significant right foot drop  . Pneumonia 25+yrs ago   hx of  . Shortness of breath   . Swelling of extremity   . Urinary frequency   . Urinary urgency   . Valvular heart disease     PAST SURGICAL HISTORY: Past Surgical History:  Procedure Laterality Date  . CARDIAC CATHETERIZATION N/A 05/02/2015   Procedure: Right/Left Heart Cath and Coronary Angiography;  Surgeon: Burnell Blanks, MD;  Location: [place] [facility];  Service: Cardiovascular;  Laterality: N/A;  . COLONOSCOPY N/A 02/07/2013   Procedure: COLONOSCOPY;  Surgeon: Juanita Craver, MD;  Location: [facility] ENDOSCOPY;  Service: Endoscopy;  Laterality: N/A;  . COLONOSCOPY N/A 02/09/2013   Procedure: COLONOSCOPY;  Surgeon: Beryle Beams, MD;  Location: [place];  Service: Endoscopy;  Laterality: N/A;  . ESOPHAGOGASTRODUODENOSCOPY N/A 02/07/2013   Procedure: ESOPHAGOGASTRODUODENOSCOPY (EGD);  Surgeon: Juanita Craver, MD;  Location: [facility] ENDOSCOPY;  Service: Endoscopy;  Laterality: N/A;  . GIVENS CAPSULE STUDY N/A 02/09/2013   Procedure: GIVENS CAPSULE STUDY;  Surgeon: Beryle Beams, MD;  Location: [place];  Service: Endoscopy;  Laterality: N/A;  . HERNIA REPAIR     umbilical hernia  . KNEE SURGERY Right    multiple knee surgeries due to complication of R TKA  . LAMINOTOMY  5427   c6-t2  . PACEMAKER IMPLANT N/A 04/27/2018   Procedure: PACEMAKER IMPLANT;  Surgeon: Constance Haw, MD;  Location: [place] [facility];  Service: Cardiovascular;  Laterality: N/A;  . SPINE SURGERY    . TEE WITHOUT  CARDIOVERSION N/A 03/07/2015   Procedure: TRANSESOPHAGEAL ECHOCARDIOGRAM (TEE);  Surgeon: Josue Hector, MD;  Location: [facility] ENDOSCOPY;  Service: Cardiovascular;  Laterality: N/A;  ANES TO BRING PROPOFOL PER DOCTOR  . TEE WITHOUT CARDIOVERSION N/A 06/10/2015   Procedure: TRANSESOPHAGEAL ECHOCARDIOGRAM (TEE);  Surgeon: Burnell Blanks, MD;  Location: [place];  Service: Open Heart Surgery;  Laterality: N/A;  . TOTAL KNEE ARTHROPLASTY     right  . TRANSCATHETER AORTIC VALVE REPLACEMENT, TRANSFEMORAL Left 06/10/2015   Procedure: TRANSCATHETER AORTIC VALVE REPLACEMENT, TRANSFEMORAL;  Surgeon: Burnell Blanks, MD;  Location: [place];  Service: Open Heart Surgery;  Laterality: Left;    SOCIAL HISTORY: Social History   Tobacco Use  . Smoking status: Former Smoker    Years: 30.00    Types: Pipe    Last attempt to quit: 09/20/2006    Years since quitting: 11.8  . Smokeless tobacco: Never Used  Substance Use Topics  . Alcohol use: No    Alcohol/week: 0.0 standard drinks  . Drug use: No    FAMILY HISTORY: Family History  Problem Relation Age of Onset  . Other Father        POSSIBLE HEART ATTACK  . Arthritis Sister        "crippling"  . Arthritis Sister     ROS: Review of Systems  Constitutional: Positive for weight loss.  Respiratory: Negative for shortness of breath.   Cardiovascular: Negative for chest pain.  Neurological: Negative for headaches.  Endo/Heme/Allergies:       Negative polyphagia    PHYSICAL EXAM: Blood pressure 114/76, pulse 97, temperature 98.1 F (36.7 C), temperature source Oral, height 6\' 2"  (1.88 m), weight (!) 356 lb (161.5 kg), SpO2 100 %. Body mass index is 45.71 kg/m. Physical Exam  Constitutional: He is oriented to person, place, and time. He appears well-developed and well-nourished.  Cardiovascular: Normal rate.  Pulmonary/Chest: Effort normal.  Musculoskeletal: Normal range of motion.  Neurological: He is oriented to person, place, and  time.  Skin: Skin is warm and dry.  Psychiatric: He has a normal mood and affect. His behavior is normal.  Vitals reviewed.   RECENT LABS AND TESTS: BMET    Component Value Date/Time   NA 140 06/29/2018 1146   NA 138 04/11/2018 1030   K 4.6 06/29/2018 1146   CL 98 06/29/2018 1146   CO2 38 (H) 06/29/2018 1146   GLUCOSE 91 06/29/2018 1146   BUN 32 (H) 06/29/2018 1146   BUN 66 (H) 04/11/2018 1030   CREATININE 1.07 06/29/2018 1146   CREATININE 1.19 02/28/2015 1548   CALCIUM 9.4 06/29/2018 1146   GFRNONAA 55 (L) 04/26/2018 1802   GFRAA >60 04/26/2018 1802   Lab Results  Component Value Date   HGBA1C 5.5 04/11/2018   HGBA1C 5.5 12/06/2017   HGBA1C 5.8 11/24/2017   HGBA1C 6.0 07/28/2017   HGBA1C 5.7 (H) 06/06/2015   Lab Results  Component Value Date   INSULIN 15.2 04/11/2018   INSULIN 21.5 12/06/2017   CBC    Component Value Date/Time   WBC 8.6 06/29/2018 1146   RBC 4.00 (L) 06/29/2018 1146   HGB 12.1 (L) 06/29/2018 1146   HCT 37.6 (L) 06/29/2018 1146   PLT 157.0 06/29/2018 1146   MCV 93.9 06/29/2018 1146   MCH 29.7 04/26/2018 1802   MCHC 32.1 06/29/2018 1146   RDW 16.7 (H) 06/29/2018 1146   LYMPHSABS 1.3 07/28/2017 1628   MONOABS 0.5 07/28/2017 1628   EOSABS 0.1 07/28/2017 1628   BASOSABS 0.1 07/28/2017 1628   Iron/TIBC/Ferritin/ %Sat No results found for: IRON, TIBC, FERRITIN, IRONPCTSAT Lipid Panel     Component Value Date/Time   CHOL 110 04/11/2018 1030   TRIG 87 04/11/2018 1030   HDL 32 (L) 04/11/2018 1030   CHOLHDL 4 11/24/2017 1633   VLDL 29.6 11/24/2017 1633   LDLCALC 61 04/11/2018 1030   LDLDIRECT 85.0 07/28/2017 1628   Hepatic Function Panel     Component Value Date/Time   PROT 6.3 06/29/2018 1146   PROT 5.9 (L) 04/11/2018 1030   ALBUMIN 3.7 06/29/2018 1146   ALBUMIN 3.5 04/11/2018 1030  AST 18 06/29/2018 1146   ALT 18 06/29/2018 1146   ALKPHOS 76 06/29/2018 1146   BILITOT 0.4 06/29/2018 1146   BILITOT 0.4 04/11/2018 1030   BILIDIR  0.1 02/21/2015 0921   IBILI 0.4 04/19/2014 1418      Component Value Date/Time   TSH 1.71 11/24/2017 1633   TSH 1.30 07/28/2017 1628   TSH 1.18 01/25/2017 1415    ASSESSMENT AND PLAN: Insulin resistance  Essential hypertension  Class 3 severe obesity with serious comorbidity and body mass index (BMI) of 45.0 to 49.9 in adult, unspecified obesity type ([place])  PLAN:  Insulin Resistance Leib will continue to work on weight loss, diet, exercise, and decreasing simple carbohydrates in his diet to help decrease the risk of diabetes. We dicussed metformin including benefits and risks. He was informed that eating too many simple carbohydrates or too many calories at one sitting increases the likelihood of GI side effects. Garlen declined metformin for now and prescription was not written today. We will recheck labs at next visit. Emeric agrees to follow up with our clinic in 2 weeks as directed to monitor his progress.  Hypertension We discussed sodium restriction, working on healthy weight loss, and a regular exercise program as the means to achieve improved blood pressure control. Ahmadou agreed with this plan and agreed to follow up as directed. We will continue to monitor his blood pressure as well as his progress with the above lifestyle modifications. Dain agrees to continue his anti-hypertensive medications and will watch for signs of hypotension as he continues his lifestyle modifications. Kwaku agrees to follow up with our clinic in 2 weeks.  Obesity Abdulahi is currently in the action stage of change. As such, his goal is to continue with weight loss efforts He has agreed to follow the Category 3 [place] has been instructed to work up to a goal of 150 minutes of combined cardio and strengthening exercise per week or as above for weight loss and overall health benefits. We discussed the following Behavioral Modification Strategies today: holiday eating strategies and planning for  success   Killian has agreed to follow up with our clinic in 2 weeks. He was informed of the importance of frequent follow up visits to maximize his success with intensive lifestyle modifications for his multiple health conditions.   OBESITY BEHAVIORAL INTERVENTION VISIT  Today's visit was # 12  Starting weight: 385 lbs Starting date: 12/06/17 Today's weight : 356 lbs Today's date: 08/03/2018 Total lbs lost to date: 29 At least 15 minutes were spent on discussing the following behavioral intervention visit.   ASK: We discussed the diagnosis of obesity with [place] agreed to give [name] permission to discuss obesity behavioral modification therapy today.  ASSESS: Eugean has the diagnosis of obesity and his BMI today is Jeddo is in the action stage of change   ADVISE: Trygg was educated on the multiple health risks of obesity as well as the benefit of weight loss to improve his health. He was advised of the need for long term treatment and the importance of lifestyle modifications to improve his current health and to decrease his risk of future health problems.  AGREE: Multiple dietary modification options and treatment options were discussed and  [place] agreed to follow the recommendations documented in the above note.  ARRANGE: Laymon was educated on the importance of frequent visits to treat obesity as outlined per CMS and USPSTF guidelines and agreed to schedule his next  follow up appointment today.  Wilhemena Durie, am acting as Location manager for [employer], FNP-C.  I have reviewed the above documentation for accuracy and completeness, and I agree with the above.  - Cydney Alvarenga, FNP-C.

## 2018-08-14 DIAGNOSIS — J9622 Acute and chronic respiratory failure with hypercapnia: Secondary | ICD-10-CM | POA: Diagnosis not present

## 2018-08-14 DIAGNOSIS — G4733 Obstructive sleep apnea (adult) (pediatric): Secondary | ICD-10-CM | POA: Diagnosis not present

## 2018-08-14 DIAGNOSIS — J449 Chronic obstructive pulmonary disease, unspecified: Secondary | ICD-10-CM | POA: Diagnosis not present

## 2018-08-14 DIAGNOSIS — R0602 Shortness of breath: Secondary | ICD-10-CM | POA: Diagnosis not present

## 2018-08-15 ENCOUNTER — Other Ambulatory Visit: Payer: Self-pay | Admitting: Medical

## 2018-08-16 ENCOUNTER — Other Ambulatory Visit: Payer: Self-pay

## 2018-08-16 ENCOUNTER — Encounter: Payer: Self-pay | Admitting: Physician Assistant

## 2018-08-16 ENCOUNTER — Ambulatory Visit (INDEPENDENT_AMBULATORY_CARE_PROVIDER_SITE_OTHER): Payer: PPO | Admitting: Physician Assistant

## 2018-08-16 VITALS — BP 132/84 | HR 68 | Temp 98.2°F | Resp 16 | Ht 74.0 in

## 2018-08-16 DIAGNOSIS — G4733 Obstructive sleep apnea (adult) (pediatric): Secondary | ICD-10-CM | POA: Diagnosis not present

## 2018-08-16 DIAGNOSIS — J0110 Acute frontal sinusitis, unspecified: Secondary | ICD-10-CM | POA: Diagnosis not present

## 2018-08-16 DIAGNOSIS — R0602 Shortness of breath: Secondary | ICD-10-CM | POA: Diagnosis not present

## 2018-08-16 MED ORDER — DOXYCYCLINE HYCLATE 100 MG PO CAPS: 100.0000 mg | ORAL_CAPSULE | Freq: Two times a day (BID) | ORAL | 0 refills | Status: DC

## 2018-08-16 NOTE — Patient Instructions (Signed)
Please keep well-hydrated and get plenty of rest.  Start your Flonase using daily. I also recommend a daily saline nasal rinse.   If symptoms are not easing up over the next 24-48 hours or anything worsens, please start the Doxycycline and take as directed.    Sinusitis, Adult Sinusitis is soreness and inflammation of your sinuses. Sinuses are hollow spaces in the bones around your face. They are located:  Around your eyes.  In the middle of your forehead.  Behind your nose.  In your cheekbones.  Your sinuses and nasal passages are lined with a stringy fluid (mucus). Mucus normally drains out of your sinuses. When your nasal tissues get inflamed or swollen, the mucus can get trapped or blocked so air cannot flow through your sinuses. This lets bacteria, viruses, and funguses grow, and that leads to infection. Follow these instructions at home: Medicines  Take, use, or apply over-the-counter and prescription medicines only as told by your doctor. These may include nasal sprays.  If you were prescribed an antibiotic medicine, take it as told by your doctor. Do not stop taking the antibiotic even if you start to feel better. Hydrate and Humidify  Drink enough water to keep your pee (urine) clear or pale yellow.  Use a cool mist humidifier to keep the humidity level in your home above 50%.  Breathe in steam for 10-15 minutes, 3-4 times a day or as told by your doctor. You can do this in the bathroom while a hot shower is running.  Try not to spend time in cool or dry air. Rest  Rest as much as possible.  Sleep with your head raised (elevated).  Make sure to get enough sleep each night. General instructions  Put a warm, moist washcloth on your face 3-4 times a day or as told by your doctor. This will help with discomfort.  Wash your hands often with soap and water. If there is no soap and water, use hand sanitizer.  Do not smoke. Avoid being around people who are smoking  (secondhand smoke).  Keep all follow-up visits as told by your doctor. This is important. Contact a doctor if:  You have a fever.  Your symptoms get worse.  Your symptoms do not get better within 10 days. Get help right away if:  You have a very bad headache.  You cannot stop throwing up (vomiting).  You have pain or swelling around your face or eyes.  You have trouble seeing.  You feel confused.  Your neck is stiff.  You have trouble breathing. This information is not intended to replace advice given to you by your health care provider. Make sure you discuss any questions you have with your health care provider. Document Released: 02/23/2008 Document Revised: 05/02/2016 Document Reviewed: 07/02/2015 Elsevier Interactive Patient Education  [employer].

## 2018-08-16 NOTE — Progress Notes (Signed)
Patient presents to clinic today c/o sinus drainage, nasal congestion, and sinus pressure. Notes the heating element went off on CPAP without him knowing which he thinks started these symptoms. Now having a cough that is productive of green sputum. Denies fever, chills, Denies SOB, lightheadedness. Has not done anything for symptoms.    Past Medical History:  Diagnosis Date  . Anemia   . Aortic stenosis   . Aortic stenosis, severe    S/p Edwards Sapien 3 Transcatheter Heart Valve (size 26 mm, model # [id number], serial # [id number])  . Arthritis   . Back pain   . Bursitis   . Cataract    left immature  . Cellulitis 10/12/2015  . Complication of anesthesia    Halucinations  . Constipation    takes Miralax daily as well as Senokot daily  . DDD (degenerative disc disease)   . Gout    takes Allopurinol daily  . Heart valve disorder   . History of blood clots 1962   knee  . History of blood transfusion    no abnormal reaction noted  . History of shingles   . Hyperlipidemia    takes Atorvastatin daily  . Hypertension    takes Lisinopril daily  . Joint pain   . Low back pain 01/25/2017  . Neck pain on left side 01/25/2017  . OSA (obstructive sleep apnea)   . Osteoarthritis   . Peripheral edema    takes Furosemide daily  . Peripheral neuropathy    takes Gabapentin daily  . Peroneal palsy    significant right foot drop  . Pneumonia 25+yrs ago   hx of  . Shortness of breath   . Swelling of extremity   . Urinary frequency   . Urinary urgency   . Valvular heart disease     Current Outpatient Medications on File Prior to Visit  Medication Sig Dispense Refill  . acetaminophen (TYLENOL) 500 MG tablet Take 1,000 mg by mouth 2 (two) times daily.     [name] aspirin 81 MG EC tablet TAKE 1 TABLET (81 MG TOTAL) BY MOUTH DAILY. 30 tablet 11  . atorvastatin (LIPITOR) 40 MG tablet Take 1 and a half tablet daily by mouth  (60mg ) 135 tablet 5  . cholecalciferol (VITAMIN D) 1000 UNITS tablet Take  2,000 Units by mouth daily.     . clopidogrel (PLAVIX) 75 MG tablet TAKE 1 TABLET (75 MG TOTAL) BY MOUTH DAILY WITH BREAKFAST. 90 tablet 3  . Coenzyme Q10 (COQ10) 200 MG CAPS Take 200 mg by mouth daily.    . Ferrous Fumarate-Folic Acid (HEMATINIC/FOLIC ACID) 341-9 MG TABS Take 1 tablet by mouth 2 (two) times a week. 90 each 1  . fluticasone (FLONASE) 50 MCG/ACT nasal spray Place 2 sprays into both nostrils daily. 16 g 1  . furosemide (LASIX) 20 MG tablet Take 1 tablet (20 mg total) by mouth 2 (two) times daily. Please keep upcoming appt in September for future refills. Thank you 180 tablet 0  . gabapentin (NEURONTIN) 300 MG capsule Take 1 capsule (300 mg total) by mouth 3 (three) times daily. 270 capsule 2  . Misc Natural Products (TART CHERRY ADVANCED PO) Take 1 capsule by mouth daily.    . Multiple Vitamin (MULTIVITAMIN WITH MINERALS) TABS Take 1 tablet by mouth daily.    . polyethylene glycol (MIRALAX / GLYCOLAX) packet Take 17 g by mouth daily.    . sennosides-docusate sodium (SENOKOT-S) 8.6-50 MG tablet Take 1 tablet by mouth 2 (two)  times daily.     No current facility-administered medications on file prior to visit.     Allergies  Allergen Reactions  . Coumadin [Warfarin Sodium] Other (See Comments)    States he can't be on this-bleeds out    Family History  Problem Relation Age of Onset  . Other Father        POSSIBLE HEART ATTACK  . Arthritis Sister        "crippling"  . Arthritis Sister     Social History   Socioeconomic History  . Marital status: Married    Spouse name: [name]  . Number of children: 3  . Years of education: Not on file  . Highest education level: Not on file  Occupational History  . Occupation: retired Investment banker, operational  . Financial resource strain: Not on file  . Food insecurity:    Worry: Not on file    Inability: Not on file  . Transportation needs:    Medical: Not on file    Non-medical: Not on file  Tobacco Use  . Smoking  status: Former Smoker    Years: 30.00    Types: Pipe    Last attempt to quit: 09/20/2006    Years since quitting: 11.9  . Smokeless tobacco: Never Used  Substance and Sexual Activity  . Alcohol use: No    Alcohol/week: 0.0 standard drinks  . Drug use: No  . Sexual activity: Never    Comment: lives with wife, retired, no dietary restrictions  Lifestyle  . Physical activity:    Days per week: Not on file    Minutes per session: Not on file  . Stress: Not on file  Relationships  . Social connections:    Talks on phone: Not on file    Gets together: Not on file    Attends religious service: Not on file    Active member of club or organization: Not on file    Attends meetings of clubs or organizations: Not on file    Relationship status: Not on file  Other Topics Concern  . Not on file  Social History Narrative  . Not on file   Review of Systems - See HPI.  All other ROS are negative.  BP 132/84   Pulse 68   Temp 98.2 F (36.8 C) (Oral)   Resp 16   Ht 6\' 2"  (1.88 m)   SpO2 96%   BMI 45.71 kg/m   Physical Exam  Recent Results (from the past 2160 hour(s))  Comprehensive metabolic panel     Status: Abnormal   Collection Time: 06/29/18 11:46 AM  Result Value Ref Range   Sodium 140 135 - 145 mEq/L   Potassium 4.6 3.5 - 5.1 mEq/L   Chloride 98 96 - 112 mEq/L   CO2 38 (H) 19 - 32 mEq/L   Glucose, Bld 91 70 - 99 mg/dL   BUN 32 (H) 6 - 23 mg/dL   Creatinine, Ser 1.07 0.40 - 1.50 mg/dL   Total Bilirubin 0.4 0.2 - 1.2 mg/dL   Alkaline Phosphatase 76 39 - 117 U/L   AST 18 0 - 37 U/L   ALT 18 0 - 53 U/L   Total Protein 6.3 6.0 - 8.3 g/dL   Albumin 3.7 3.5 - 5.2 g/dL   Calcium 9.4 8.4 - 10.5 mg/dL   GFR 71.43 >60.00 mL/min  CBC     Status: Abnormal   Collection Time: 06/29/18 11:46 AM  Result Value Ref Range  WBC 8.6 4.0 - 10.5 K/uL   RBC 4.00 (L) 4.22 - 5.81 Mil/uL   Platelets 157.0 150.0 - 400.0 K/uL   Hemoglobin 12.1 (L) 13.0 - 17.0 g/dL   HCT 37.6 (L) 39.0 - 52.0  %   MCV 93.9 78.0 - 100.0 fl   MCHC 32.1 30.0 - 36.0 g/dL   RDW 16.7 (H) 11.5 - 15.5 %    Assessment/Plan: 1. Acute non-recurrent frontal sinusitis Seems viral in nature although symptoms present for 7 days so could be mild bacterial infection. Supportive measures and OTC medications reviewed. Rx Flonase. Rx Doxycycline given for him to have on hand. Start if symptoms not leveling off within 48 hours or if anything worsens.    Leeanne Rio, PA-C

## 2018-08-18 DIAGNOSIS — G4733 Obstructive sleep apnea (adult) (pediatric): Secondary | ICD-10-CM | POA: Diagnosis not present

## 2018-08-21 DIAGNOSIS — R0602 Shortness of breath: Secondary | ICD-10-CM | POA: Diagnosis not present

## 2018-08-21 DIAGNOSIS — J9622 Acute and chronic respiratory failure with hypercapnia: Secondary | ICD-10-CM | POA: Diagnosis not present

## 2018-08-21 DIAGNOSIS — G4733 Obstructive sleep apnea (adult) (pediatric): Secondary | ICD-10-CM | POA: Diagnosis not present

## 2018-08-21 DIAGNOSIS — J449 Chronic obstructive pulmonary disease, unspecified: Secondary | ICD-10-CM | POA: Diagnosis not present

## 2018-08-22 ENCOUNTER — Encounter (INDEPENDENT_AMBULATORY_CARE_PROVIDER_SITE_OTHER): Payer: Self-pay | Admitting: Family Medicine

## 2018-08-22 ENCOUNTER — Ambulatory Visit (INDEPENDENT_AMBULATORY_CARE_PROVIDER_SITE_OTHER): Payer: PPO | Admitting: Family Medicine

## 2018-08-22 VITALS — BP 158/96 | HR 86 | Temp 98.0°F | Ht 74.0 in | Wt 357.0 lb

## 2018-08-22 DIAGNOSIS — E7849 Other hyperlipidemia: Secondary | ICD-10-CM

## 2018-08-22 DIAGNOSIS — R739 Hyperglycemia, unspecified: Secondary | ICD-10-CM | POA: Diagnosis not present

## 2018-08-22 DIAGNOSIS — I10 Essential (primary) hypertension: Secondary | ICD-10-CM | POA: Diagnosis not present

## 2018-08-22 DIAGNOSIS — Z6841 Body Mass Index (BMI) 40.0 and over, adult: Secondary | ICD-10-CM

## 2018-08-22 DIAGNOSIS — E559 Vitamin D deficiency, unspecified: Secondary | ICD-10-CM | POA: Diagnosis not present

## 2018-08-23 ENCOUNTER — Ambulatory Visit: Payer: Self-pay

## 2018-08-23 ENCOUNTER — Encounter: Payer: Self-pay | Admitting: Sports Medicine

## 2018-08-23 ENCOUNTER — Ambulatory Visit (INDEPENDENT_AMBULATORY_CARE_PROVIDER_SITE_OTHER): Payer: PPO | Admitting: Sports Medicine

## 2018-08-23 VITALS — BP 124/80 | HR 66 | Ht 74.0 in

## 2018-08-23 DIAGNOSIS — M19012 Primary osteoarthritis, left shoulder: Secondary | ICD-10-CM | POA: Diagnosis not present

## 2018-08-23 DIAGNOSIS — G8929 Other chronic pain: Secondary | ICD-10-CM

## 2018-08-23 DIAGNOSIS — M25511 Pain in right shoulder: Secondary | ICD-10-CM | POA: Diagnosis not present

## 2018-08-23 DIAGNOSIS — M25512 Pain in left shoulder: Secondary | ICD-10-CM

## 2018-08-23 LAB — LIPID PANEL WITH LDL/HDL RATIO
Cholesterol, Total: 140 mg/dL (ref 100–199)
HDL: 30 mg/dL — ABNORMAL LOW (ref >39)
LDL Calculated: 85 mg/dL (ref 0–99)
LDl/HDL Ratio: 2.8 ratio (ref 0.0–3.6)
Triglycerides: 126 mg/dL (ref 0–149)
VLDL Cholesterol Cal: 25 mg/dL (ref 5–40)

## 2018-08-23 LAB — HEMOGLOBIN A1C
Est. average glucose Bld gHb Est-mCnc: 114 mg/dL
Hgb A1c MFr Bld: 5.6 % (ref 4.8–5.6)

## 2018-08-23 LAB — VITAMIN D 25 HYDROXY (VIT D DEFICIENCY, FRACTURES): Vit D, 25-Hydroxy: 46.4 ng/mL (ref 30.0–100.0)

## 2018-08-23 LAB — INSULIN, RANDOM: INSULIN: 15.2 u[IU]/mL (ref 2.6–24.9)

## 2018-08-23 NOTE — Progress Notes (Signed)
[name]. [place], [place] [facility] [place] - 77 y.o. male MRN [id number]  Date of birth: [birth year]  Visit Date: 08/23/2018  PCP: [name], MD   Referred by: [name], MD   SUBJECTIVE:  [name] is here for f/u B shoulder pain (Sx improved after B shoulder injections 06/14/18, started to flare back up about 1 week ago. Feels catching pain in L shoulder when putting wt on forearms when using walker. Using Bengay and taking Gabapentin 300 mg TID with some relief. Moderate-severe sharp pain when flaring up. ) and upper back pain (4-6 weeks ago was brushing teeth and felt sharp "electric shock" in his upper back. It felt like he had been shot in the back. Sx were off and on x 3 days. Saw Dr. Maryjean Ka for similar issue in the past, received inj and sx improved. )   HPI: Patient presenting with the above symptoms.  He has been using BenGay, gabapentin 300 mg 3 times daily and continues to have some moderate pain and discomfort.  The shoulder did seem to start flareup a week ago after using a platform walker.  REVIEW OF SYSTEMS: Patient reports no significant nighttime disturbances with this.  He does have numbness tingling and weakness in the bilateral upper and lower extremities.[name]   HISTORY:  Prior history reviewed and updated per electronic medical record.  Social History   Occupational History  . Occupation: retired Nurse, mental health  . Smoking status: Former Smoker    Years: 30.00    Types: Pipe    Last attempt to quit: 09/20/2006    Years since quitting: 12.1  . Smokeless tobacco: Never Used  Substance and Sexual Activity  . Alcohol use: No    Alcohol/week: 0.0 standard drinks  . Drug use: No  . Sexual activity: Never    Comment: lives with wife, retired, no dietary restrictions   Social History   Social History Narrative  . Not on file    DATA OBTAINED & REVIEWED:   Recent  Labs    11/24/17 1633 12/06/17 0954 04/11/18 1030 04/25/18 1020 04/26/18 0955 06/29/18 1146 08/22/18 0951 10/17/18 1230  HGBA1C 5.8 5.5 5.5  --   --   --  5.6  --   LABURIC 6.1  --   --   --   --   --   --   --   CALCIUM 9.8  --  8.7 9.4 9.2 9.4  --  9.1  AST 21  --  17 14 27 18   --   --   ALT 18  --  20 19 24 18   --   --   TSH 1.71  --   --   --   --   --   --   --    No problems updated.   OBJECTIVE:  VS:  HT:6\' 2"  (188 cm)   WT:   BMI:     BP:124/80  HR:66bpm  TEMP: ( )  RESP:90 %   PHYSICAL EXAM: CONSTITUTIONAL: Well-developed, Well-nourished and In no acute distress PSYCHIATRIC: Alert & appropriately interactive. and Not depressed or anxious appearing. RESPIRATORY: No increased work of breathing and Trachea Midline EYES: Pupils are equal., EOM intact without nystagmus. and No scleral icterus.  VASCULAR EXAM: Warm and well perfused NEURO: unremarkable  MSK Exam:  Bilateral shoulder  Well aligned, no significant deformity. No overlying skin  changes. TTP over [facility] joint on the left side.  Minimal pain over the right. Non tender over Pectoralis muscles or biceps tendon. No swelling No effusion Full strength with internal rotation, external rotation.  Limited range of motion with overhead reach.  He has a small amount of pain with axial load and circumduction but localizing pain mainly to the [facility] joint.     ASSESSMENT   1. Chronic pain of both shoulders   2. Primary osteoarthritis of left shoulder     PLAN:  Pertinent additional documentation may be included in corresponding procedure notes, imaging studies, problem based documentation and patient instructions.  Procedures:  US Guided Injection per procedure note   Medications:  No orders of the defined types were placed in this encounter.  Discussion/Instructions: No problem-specific Assessment & Plan notes found for this encounter.  [name] He is doing quite well overall and is in significantly less pain  in his last visit.  He is having localized left shoulder pain worse with rotational movements using a platform walker.  Direct AC joint injection performed today. . Discussed red flag symptoms that warrant earlier emergent evaluation and patient voices understanding. . Activity modifications and the importance of avoiding exacerbating activities (limiting pain to no more than a 4 / 10 during or following activity) recommended and discussed.  Follow-up:  . Return in about 12 weeks (around 11/15/2018).  . If any lack of improvement: consider further diagnostic evaluation with x-rays of the shoulders and/or cervical spine . At follow up will plan: to consider repeat corticosteroid injections         Gerda Diss, [place] Physician

## 2018-08-23 NOTE — Procedures (Signed)
PROCEDURE NOTE:  Ultrasound Guided: Injection: Left shoulder, AC joint Images were obtained and interpreted by myself, Teresa Coombs, DO  Images have been saved and stored to PACS system. Images obtained on: GE S7 Ultrasound machine    ULTRASOUND FINDINGS:  Marked degenerative change of the [facility] joint.  Positive mushroom sign.  DESCRIPTION OF PROCEDURE:  The patient's clinical condition is marked by substantial pain and/or significant functional disability. Other conservative therapy has not provided relief, is contraindicated, or not appropriate. There is a reasonable likelihood that injection will significantly improve the patient's pain and/or functional impairment.   After discussing the risks, benefits and expected outcomes of the injection and all questions were reviewed and answered, the patient wished to undergo the above named procedure.  Verbal consent was obtained.  The ultrasound was used to identify the target structure and adjacent neurovascular structures. The skin was then prepped in sterile fashion and the target structure was injected under direct visualization using sterile technique as below:  Single injection performed as below: PREP: Alcohol and Ethel Chloride APPROACH:direct, single injection, 25g 1.5 in. INJECTATE: 1 cc 0.5% Marcaine and 1 cc 80mg /mL DepoMedrol ASPIRATE: None DRESSING: Band-Aid  Post procedural instructions including recommending icing and warning signs for infection were reviewed.    This procedure was well tolerated and there were no complications.   IMPRESSION: Succesful Ultrasound Guided: Injection

## 2018-08-23 NOTE — Patient Instructions (Addendum)

## 2018-08-25 NOTE — Progress Notes (Signed)
Office: [phone]  /  Fax: [phone]   HPI:   Chief Complaint: [name] is here to discuss his progress with his obesity treatment plan. He is on the  follow the Category 3 plan and is following his eating plan approximately 50 % of the time. He states he is exercising 0 minutes 0 times per week. [name] was off plan some at Thanksgiving. He had many sweets. He is eating all the protein on the plan. He plans on getting back on track today.   His weight is (!) 357 lb (161.9 kg) today and has had a weight loss of 1 pound over a period of 3 weeks since his last visit. He has lost 28 lbs since starting treatment with [name].  Hypertension [name] is a 77 y.o. male with hypertension.  [name] denies chest pain or shortness of breath on exertion. He is working weight loss to help control his blood pressure with the goal of decreasing his risk of heart attack and stroke. [name] blood pressure is elevated today. Lisinopril was discontinued due to hypotension. He feels his blood pressure is elevated related to traffic on the way to clinic.   Hyperlipidemia [name] has hyperlipidemia and has been trying to improve his cholesterol levels with intensive lifestyle modification including a low saturated fat diet, exercise and weight loss. He denies any chest pain, claudication or myalgias. He is  currently on a statin.  Lab Results  Component Value Date   CHOL 140 08/22/2018   HDL 30 (L) 08/22/2018   LDLCALC 85 08/22/2018   LDLDIRECT 85.0 07/28/2017   TRIG 126 08/22/2018   CHOLHDL 4 11/24/2017    Hyperglycemia [name] has hyperglycemia. He denies any hypoglycemic episodes.   Vitamin D deficiency [name] has a diagnosis of vitamin D deficiency. He is currently taking vit D 2000 units daily and denies nausea, vomiting or muscle weakness. He is almost at goal of 50.  Ref. Range 08/22/2018 09:51  Vitamin D, 25-Hydroxy Latest Ref Range: 30.0 - 100.0 ng/mL 46.4   ALLERGIES: Allergies    Allergen Reactions  . Coumadin [Warfarin Sodium] Other (See Comments)    States he can't be on this-bleeds out    MEDICATIONS: Current Outpatient Medications on File Prior to Visit  Medication Sig Dispense Refill  . acetaminophen (TYLENOL) 500 MG tablet Take 1,000 mg by mouth 2 (two) times daily.     [name] aspirin 81 MG EC tablet TAKE 1 TABLET (81 MG TOTAL) BY MOUTH DAILY. 30 tablet 11  . atorvastatin (LIPITOR) 40 MG tablet Take 1 and a half tablet daily by mouth  (60mg ) 135 tablet 5  . cholecalciferol (VITAMIN D) 1000 UNITS tablet Take 2,000 Units by mouth daily.     . clopidogrel (PLAVIX) 75 MG tablet TAKE 1 TABLET (75 MG TOTAL) BY MOUTH DAILY WITH BREAKFAST. 90 tablet 3  . Coenzyme Q10 (COQ10) 200 MG CAPS Take 200 mg by mouth daily.    . Ferrous Fumarate-Folic Acid (HEMATINIC/FOLIC ACID) 607-3 MG TABS Take 1 tablet by mouth 2 (two) times a week. 90 each 1  . fluticasone (FLONASE) 50 MCG/ACT nasal spray Place 2 sprays into both nostrils daily. 16 g 1  . furosemide (LASIX) 20 MG tablet Take 1 tablet (20 mg total) by mouth 2 (two) times daily. Please keep upcoming appt in September for future refills. Thank you 180 tablet 0  . gabapentin (NEURONTIN) 300 MG capsule Take 1 capsule (300 mg total) by mouth 3 (three) times  daily. 270 capsule 2  . Misc Natural Products (TART CHERRY ADVANCED PO) Take 1 capsule by mouth daily.    . Multiple Vitamin (MULTIVITAMIN WITH MINERALS) TABS Take 1 tablet by mouth daily.    . polyethylene glycol (MIRALAX / GLYCOLAX) packet Take 17 g by mouth daily.    . sennosides-docusate sodium (SENOKOT-S) 8.6-50 MG tablet Take 1 tablet by mouth 2 (two) times daily.     No current facility-administered medications on file prior to visit.     PAST MEDICAL HISTORY: Past Medical History:  Diagnosis Date  . Anemia   . Aortic stenosis   . Aortic stenosis, severe    S/p Edwards Sapien 3 Transcatheter Heart Valve (size 26 mm, model # [id number], serial # [id number])  . Arthritis    . Back pain   . Bursitis   . Cataract    left immature  . Cellulitis 10/12/2015  . Complication of anesthesia    Halucinations  . Constipation    takes Miralax daily as well as Senokot daily  . DDD (degenerative disc disease)   . Gout    takes Allopurinol daily  . Heart valve disorder   . History of blood clots 1962   knee  . History of blood transfusion    no abnormal reaction noted  . History of shingles   . Hyperlipidemia    takes Atorvastatin daily  . Hypertension    takes Lisinopril daily  . Joint pain   . Low back pain 01/25/2017  . Neck pain on left side 01/25/2017  . OSA (obstructive sleep apnea)   . Osteoarthritis   . Peripheral edema    takes Furosemide daily  . Peripheral neuropathy    takes Gabapentin daily  . Peroneal palsy    significant right foot drop  . Pneumonia 25+yrs ago   hx of  . Shortness of breath   . Swelling of extremity   . Urinary frequency   . Urinary urgency   . Valvular heart disease     PAST SURGICAL HISTORY: Past Surgical History:  Procedure Laterality Date  . CARDIAC CATHETERIZATION N/A 05/02/2015   Procedure: Right/Left Heart Cath and Coronary Angiography;  Surgeon: Burnell Blanks, MD;  Location: [facility];  Service: Cardiovascular;  Laterality: N/A;  . COLONOSCOPY N/A 02/07/2013   Procedure: COLONOSCOPY;  Surgeon: Juanita Craver, MD;  Location: [facility] ENDOSCOPY;  Service: Endoscopy;  Laterality: N/A;  . COLONOSCOPY N/A 02/09/2013   Procedure: COLONOSCOPY;  Surgeon: Beryle Beams, MD;  Location: [facility];  Service: Endoscopy;  Laterality: N/A;  . ESOPHAGOGASTRODUODENOSCOPY N/A 02/07/2013   Procedure: ESOPHAGOGASTRODUODENOSCOPY (EGD);  Surgeon: Juanita Craver, MD;  Location: [facility] ENDOSCOPY;  Service: Endoscopy;  Laterality: N/A;  . GIVENS CAPSULE STUDY N/A 02/09/2013   Procedure: GIVENS CAPSULE STUDY;  Surgeon: Beryle Beams, MD;  Location: [place];  Service: Endoscopy;  Laterality: N/A;  . HERNIA REPAIR     umbilical  hernia  . KNEE SURGERY Right    multiple knee surgeries due to complication of R TKA  . LAMINOTOMY  4174   c6-t2  . PACEMAKER IMPLANT N/A 04/27/2018   Procedure: PACEMAKER IMPLANT;  Surgeon: Constance Haw, MD;  Location: [place] [facility];  Service: Cardiovascular;  Laterality: N/A;  . SPINE SURGERY    . TEE WITHOUT CARDIOVERSION N/A 03/07/2015   Procedure: TRANSESOPHAGEAL ECHOCARDIOGRAM (TEE);  Surgeon: Josue Hector, MD;  Location: [facility] ENDOSCOPY;  Service: Cardiovascular;  Laterality: N/A;  ANES TO BRING PROPOFOL PER  DOCTOR  . TEE WITHOUT CARDIOVERSION N/A 06/10/2015   Procedure: TRANSESOPHAGEAL ECHOCARDIOGRAM (TEE);  Surgeon: Burnell Blanks, MD;  Location: [place];  Service: Open Heart Surgery;  Laterality: N/A;  . TOTAL KNEE ARTHROPLASTY     right  . TRANSCATHETER AORTIC VALVE REPLACEMENT, TRANSFEMORAL Left 06/10/2015   Procedure: TRANSCATHETER AORTIC VALVE REPLACEMENT, TRANSFEMORAL;  Surgeon: Burnell Blanks, MD;  Location: [place];  Service: Open Heart Surgery;  Laterality: Left;    SOCIAL HISTORY: Social History   Tobacco Use  . Smoking status: Former Smoker    Years: 30.00    Types: Pipe    Last attempt to quit: 09/20/2006    Years since quitting: 11.9  . Smokeless tobacco: Never Used  Substance Use Topics  . Alcohol use: No    Alcohol/week: 0.0 standard drinks  . Drug use: No    FAMILY HISTORY: Family History  Problem Relation Age of Onset  . Other Father        POSSIBLE HEART ATTACK  . Arthritis Sister        "crippling"  . Arthritis Sister     ROS: Review of Systems  Constitutional: Negative for weight loss.  Respiratory: Negative for shortness of breath.   Cardiovascular: Negative for chest pain and claudication.  Gastrointestinal: Negative for nausea and vomiting.  Musculoskeletal: Negative for myalgias.       Negative for muscle weakness  Endo/Heme/Allergies:       Negative for hypoglycemia    PHYSICAL EXAM: Blood pressure (!) 158/96,  pulse 86, temperature 98 F (36.7 C), temperature source Oral, height 6\' 2"  (1.88 m), weight (!) 357 lb (161.9 kg), SpO2 93 %. Body mass index is 45.84 kg/m. Physical Exam  Constitutional: He is oriented to person, place, and time. He appears well-developed and well-nourished.  HENT:  Head: Normocephalic.  Eyes: Pupils are equal, round, and reactive to light.  Neck: Normal range of motion.  Cardiovascular: Normal rate.  Pulmonary/Chest: Effort normal.  Musculoskeletal: Normal range of motion.  Neurological: He is alert and oriented to person, place, and time.  Skin: Skin is warm and dry.  Psychiatric: He has a normal mood and affect. His behavior is normal.  Vitals reviewed.   RECENT LABS AND TESTS: BMET    Component Value Date/Time   NA 140 06/29/2018 1146   NA 138 04/11/2018 1030   K 4.6 06/29/2018 1146   CL 98 06/29/2018 1146   CO2 38 (H) 06/29/2018 1146   GLUCOSE 91 06/29/2018 1146   BUN 32 (H) 06/29/2018 1146   BUN 66 (H) 04/11/2018 1030   CREATININE 1.07 06/29/2018 1146   CREATININE 1.19 02/28/2015 1548   CALCIUM 9.4 06/29/2018 1146   GFRNONAA 55 (L) 04/26/2018 1802   GFRAA >60 04/26/2018 1802   Lab Results  Component Value Date   HGBA1C 5.6 08/22/2018   HGBA1C 5.5 04/11/2018   HGBA1C 5.5 12/06/2017   HGBA1C 5.8 11/24/2017   HGBA1C 6.0 07/28/2017   Lab Results  Component Value Date   INSULIN 15.2 08/22/2018   INSULIN 15.2 04/11/2018   INSULIN 21.5 12/06/2017   CBC    Component Value Date/Time   WBC 8.6 06/29/2018 1146   RBC 4.00 (L) 06/29/2018 1146   HGB 12.1 (L) 06/29/2018 1146   HCT 37.6 (L) 06/29/2018 1146   PLT 157.0 06/29/2018 1146   MCV 93.9 06/29/2018 1146   MCH 29.7 04/26/2018 1802   MCHC 32.1 06/29/2018 1146   RDW 16.7 (H) 06/29/2018 1146   LYMPHSABS  1.3 07/28/2017 1628   MONOABS 0.5 07/28/2017 1628   EOSABS 0.1 07/28/2017 1628   BASOSABS 0.1 07/28/2017 1628   Iron/TIBC/Ferritin/ %Sat No results found for: IRON, TIBC, FERRITIN,  IRONPCTSAT Lipid Panel     Component Value Date/Time   CHOL 140 08/22/2018 0951   TRIG 126 08/22/2018 0951   HDL 30 (L) 08/22/2018 0951   CHOLHDL 4 11/24/2017 1633   VLDL 29.6 11/24/2017 1633   LDLCALC 85 08/22/2018 0951   LDLDIRECT 85.0 07/28/2017 1628   Hepatic Function Panel     Component Value Date/Time   PROT 6.3 06/29/2018 1146   PROT 5.9 (L) 04/11/2018 1030   ALBUMIN 3.7 06/29/2018 1146   ALBUMIN 3.5 04/11/2018 1030   AST 18 06/29/2018 1146   ALT 18 06/29/2018 1146   ALKPHOS 76 06/29/2018 1146   BILITOT 0.4 06/29/2018 1146   BILITOT 0.4 04/11/2018 1030   BILIDIR 0.1 02/21/2015 0921   IBILI 0.4 04/19/2014 1418      Component Value Date/Time   TSH 1.71 11/24/2017 1633   TSH 1.30 07/28/2017 1628   TSH 1.18 01/25/2017 1415    Ref. Range 08/22/2018 09:51  Vitamin D, 25-Hydroxy Latest Ref Range: 30.0 - 100.0 ng/mL 46.4    ASSESSMENT AND PLAN: Essential hypertension  Other hyperlipidemia - Plan: Lipid Panel With LDL/HDL Ratio  Hyperglycemia - Plan: Hemoglobin A1c, Insulin, random  Vitamin D deficiency - Plan: VITAMIN D 25 Hydroxy (Vit-D Deficiency, Fractures)  Class 3 severe obesity with serious comorbidity and body mass index (BMI) of 45.0 to 49.9 in adult, unspecified obesity type ([name])  PLAN: Hypertension We discussed sodium restriction, working on healthy weight loss, and a regular exercise program as the means to achieve improved blood pressure control. [name] agreed with this plan and agreed to follow up as directed. We will continue to monitor his blood pressure as well as his progress with the above lifestyle modifications. We will continue to monitor blood pressure.   Hyperlipidemia Akeem was informed of the American Heart Association Guidelines emphasizing intensive lifestyle modifications as the first line treatment for hyperlipidemia. We discussed many lifestyle modifications today in depth, and [name] will continue to work on decreasing saturated fats  such as fatty red meat, butter and many fried foods. He will also increase vegetables and lean protein in his diet and continue to work on exercise and weight loss efforts. We will repeat fasting lipid panel today.   Hyperglycemia We discussed hyperglycemia. We will repeat fasting insulin and HgA1c today.   Vitamin D Deficiency [name] was informed that low vitamin D levels contributes to fatigue and are associated with obesity, breast, and colon cancer. He agrees to continue to take over the counter vit D 2000 units daily and will follow up for routine testing of vitamin D, at least 2-3 times per year. He was informed of the risk of over-replacement of vitamin D and agrees to not increase his dose unless he discusses this with [name] first. We will repeat vit D level today.   Obesity [name] is currently in the action stage of change. As such, his goal is to continue with weight loss efforts He has agreed to follow the Category 3 [place] has been instructed to not exercise yet.  We discussed the following Behavioral Modification Strategies today: increasing lean protein intake, better snacking choices, and planning for success.    [name] has agreed to follow up with our clinic in 3 weeks. He was informed of the importance of frequent follow  up visits to maximize his success with intensive lifestyle modifications for his multiple health conditions.   OBESITY BEHAVIORAL INTERVENTION VISIT  Today's visit was # 13   Starting weight: 385 lb Starting date: 12/06/17 Today's weight : Weight: (!) 357 lb (161.9 kg)  Today's date: 08/22/18 Total lbs lost to date: 28 lb At least 15 minutes were spent on discussing the following behavioral intervention visit.   ASK: We discussed the diagnosis of obesity with [name] agreed to give [name] permission to discuss obesity behavioral modification therapy today.  ASSESS: [name] has the diagnosis of obesity and his BMI today is 75.82 [name]  is in the action stage of change   ADVISE: [name] was educated on the multiple health risks of obesity as well as the benefit of weight loss to improve his health. He was advised of the need for long term treatment and the importance of lifestyle modifications to improve his current health and to decrease his risk of future health problems.  AGREE: Multiple dietary modification options and treatment options were discussed and  Zaviyar agreed to follow the recommendations documented in the above note.  ARRANGE: [name] was educated on the importance of frequent visits to treat obesity as outlined per CMS and USPSTF guidelines and agreed to schedule his next follow up appointment today.  I, Renee Ramus, am acting as Location manager for [employer], FNP-C.  I have reviewed the above documentation for accuracy and completeness, and I agree with the above.  - Sheranda Seabrooks, FNP-C.

## 2018-08-28 ENCOUNTER — Encounter (INDEPENDENT_AMBULATORY_CARE_PROVIDER_SITE_OTHER): Payer: Self-pay | Admitting: Family Medicine

## 2018-09-05 ENCOUNTER — Other Ambulatory Visit: Payer: Self-pay | Admitting: Cardiovascular Disease

## 2018-09-11 ENCOUNTER — Encounter (INDEPENDENT_AMBULATORY_CARE_PROVIDER_SITE_OTHER): Payer: Self-pay | Admitting: Family Medicine

## 2018-09-11 ENCOUNTER — Ambulatory Visit (INDEPENDENT_AMBULATORY_CARE_PROVIDER_SITE_OTHER): Payer: PPO | Admitting: Family Medicine

## 2018-09-11 VITALS — BP 128/72 | HR 65 | Temp 98.2°F | Ht 74.0 in | Wt 357.0 lb

## 2018-09-11 DIAGNOSIS — Z6841 Body Mass Index (BMI) 40.0 and over, adult: Secondary | ICD-10-CM | POA: Diagnosis not present

## 2018-09-11 DIAGNOSIS — R7303 Prediabetes: Secondary | ICD-10-CM | POA: Diagnosis not present

## 2018-09-11 NOTE — Progress Notes (Signed)
Office: [phone]  /  Fax: [phone]   HPI:   Chief Complaint: [name] is here to discuss his progress with his obesity treatment plan. He is on the Category 3 plan and is following his eating plan approximately 25% of the time. He states he is exercising 0 minutes 0 times per week. [name] has been off track due to the holiday. Friends have brought over sweets, which he is eating.  His weight is (!) 357 lb (161.9 kg) today and has not lost weight since his last visit. He has lost 28 lbs since starting treatment with [name].  [name] has a diagnosis of pre-diabetes based on his elevated Hgb A1c at 5.6 on 08/22/18, and was informed this puts him at greater risk of developing diabetes. He notes polyphagia and he is not on metformin. His A1c is slightly elevated from 3 months ago, increased to 5.6 from 5.5. He continues to work on diet and exercise to decrease risk of diabetes. He denies nausea or hypoglycemia.  ALLERGIES: Allergies  Allergen Reactions  . Coumadin [Warfarin Sodium] Other (See Comments)    States he can't be on this-bleeds out    MEDICATIONS: Current Outpatient Medications on File Prior to Visit  Medication Sig Dispense Refill  . acetaminophen (TYLENOL) 500 MG tablet Take 1,000 mg by mouth 2 (two) times daily.     [name] aspirin 81 MG EC tablet TAKE 1 TABLET (81 MG TOTAL) BY MOUTH DAILY. 30 tablet 11  . atorvastatin (LIPITOR) 40 MG tablet Take 1 and a half tablet daily by mouth  (60mg ) 135 tablet 5  . cholecalciferol (VITAMIN D) 1000 UNITS tablet Take 2,000 Units by mouth daily.     . clopidogrel (PLAVIX) 75 MG tablet TAKE 1 TABLET (75 MG TOTAL) BY MOUTH DAILY WITH BREAKFAST. 90 tablet 3  . Coenzyme Q10 (COQ10) 200 MG CAPS Take 200 mg by mouth daily.    . Ferrous Fumarate-Folic Acid (HEMATINIC/FOLIC ACID) 528-4 MG TABS Take 1 tablet by mouth 2 (two) times a week. 90 each 1  . fluticasone (FLONASE) 50 MCG/ACT nasal spray Place 2 sprays into both nostrils  daily. 16 g 1  . furosemide (LASIX) 20 MG tablet Take 1 tablet (20 mg total) by mouth 2 (two) times daily. 180 tablet 2  . gabapentin (NEURONTIN) 300 MG capsule Take 1 capsule (300 mg total) by mouth 3 (three) times daily. 270 capsule 2  . Misc Natural Products (TART CHERRY ADVANCED PO) Take 1 capsule by mouth daily.    . Multiple Vitamin (MULTIVITAMIN WITH MINERALS) TABS Take 1 tablet by mouth daily.    . polyethylene glycol (MIRALAX / GLYCOLAX) packet Take 17 g by mouth daily.    . sennosides-docusate sodium (SENOKOT-S) 8.6-50 MG tablet Take 1 tablet by mouth 2 (two) times daily.     No current facility-administered medications on file prior to visit.     PAST MEDICAL HISTORY: Past Medical History:  Diagnosis Date  . Anemia   . Aortic stenosis   . Aortic stenosis, severe    S/p Edwards Sapien 3 Transcatheter Heart Valve (size 26 mm, model # [id number], serial # [id number])  . Arthritis   . Back pain   . Bursitis   . Cataract    left immature  . Cellulitis 10/12/2015  . Complication of anesthesia    Halucinations  . Constipation    takes Miralax daily as well as Senokot daily  . DDD (degenerative disc disease)   . Gout  takes Allopurinol daily  . Heart valve disorder   . History of blood clots 1962   knee  . History of blood transfusion    no abnormal reaction noted  . History of shingles   . Hyperlipidemia    takes Atorvastatin daily  . Hypertension    takes Lisinopril daily  . Joint pain   . Low back pain 01/25/2017  . Neck pain on left side 01/25/2017  . OSA (obstructive sleep apnea)   . Osteoarthritis   . Peripheral edema    takes Furosemide daily  . Peripheral neuropathy    takes Gabapentin daily  . Peroneal palsy    significant right foot drop  . Pneumonia 25+yrs ago   hx of  . Shortness of breath   . Swelling of extremity   . Urinary frequency   . Urinary urgency   . Valvular heart disease     PAST SURGICAL HISTORY: Past Surgical History:  Procedure  Laterality Date  . CARDIAC CATHETERIZATION N/A 05/02/2015   Procedure: Right/Left Heart Cath and Coronary Angiography;  Surgeon: Burnell Blanks, MD;  Location: [place] [facility];  Service: Cardiovascular;  Laterality: N/A;  . COLONOSCOPY N/A 02/07/2013   Procedure: COLONOSCOPY;  Surgeon: Juanita Craver, MD;  Location: [facility] ENDOSCOPY;  Service: Endoscopy;  Laterality: N/A;  . COLONOSCOPY N/A 02/09/2013   Procedure: COLONOSCOPY;  Surgeon: Beryle Beams, MD;  Location: [place];  Service: Endoscopy;  Laterality: N/A;  . ESOPHAGOGASTRODUODENOSCOPY N/A 02/07/2013   Procedure: ESOPHAGOGASTRODUODENOSCOPY (EGD);  Surgeon: Juanita Craver, MD;  Location: [facility] ENDOSCOPY;  Service: Endoscopy;  Laterality: N/A;  . GIVENS CAPSULE STUDY N/A 02/09/2013   Procedure: GIVENS CAPSULE STUDY;  Surgeon: Beryle Beams, MD;  Location: [place];  Service: Endoscopy;  Laterality: N/A;  . HERNIA REPAIR     umbilical hernia  . KNEE SURGERY Right    multiple knee surgeries due to complication of R TKA  . LAMINOTOMY  7035   c6-t2  . PACEMAKER IMPLANT N/A 04/27/2018   Procedure: PACEMAKER IMPLANT;  Surgeon: Constance Haw, MD;  Location: [place] [facility];  Service: Cardiovascular;  Laterality: N/A;  . SPINE SURGERY    . TEE WITHOUT CARDIOVERSION N/A 03/07/2015   Procedure: TRANSESOPHAGEAL ECHOCARDIOGRAM (TEE);  Surgeon: Josue Hector, MD;  Location: [name];  Service: Cardiovascular;  Laterality: N/A;  ANES TO BRING PROPOFOL PER DOCTOR  . TEE WITHOUT CARDIOVERSION N/A 06/10/2015   Procedure: TRANSESOPHAGEAL ECHOCARDIOGRAM (TEE);  Surgeon: Burnell Blanks, MD;  Location: [place];  Service: Open Heart Surgery;  Laterality: N/A;  . TOTAL KNEE ARTHROPLASTY     right  . TRANSCATHETER AORTIC VALVE REPLACEMENT, TRANSFEMORAL Left 06/10/2015   Procedure: TRANSCATHETER AORTIC VALVE REPLACEMENT, TRANSFEMORAL;  Surgeon: Burnell Blanks, MD;  Location: [place];  Service: Open Heart Surgery;  Laterality: Left;      SOCIAL HISTORY: Social History   Tobacco Use  . Smoking status: Former Smoker    Years: 30.00    Types: Pipe    Last attempt to quit: 09/20/2006    Years since quitting: 11.9  . Smokeless tobacco: Never Used  Substance Use Topics  . Alcohol use: No    Alcohol/week: 0.0 standard drinks  . Drug use: No    FAMILY HISTORY: Family History  Problem Relation Age of Onset  . Other Father        POSSIBLE HEART ATTACK  . Arthritis Sister        "crippling"  . Arthritis Sister  ROS: Review of Systems  Constitutional: Negative for weight loss.  Gastrointestinal: Negative for nausea.  Endo/Heme/Allergies:       Positive polyphagia Negative hypoglycemia    PHYSICAL EXAM: Blood pressure 128/72, pulse 65, temperature 98.2 F (36.8 C), temperature source Oral, height 6\' 2"  (1.88 m), weight (!) 357 lb (161.9 kg), SpO2 94 %. Body mass index is 45.84 kg/m. Physical Exam Vitals signs reviewed.  Constitutional:      Appearance: Normal appearance. He is obese.  Cardiovascular:     Rate and Rhythm: Normal rate.     Pulses: Normal pulses.  Pulmonary:     Effort: Pulmonary effort is normal.  Musculoskeletal: Normal range of motion.  Skin:    General: Skin is warm and dry.  Neurological:     Mental Status: He is alert and oriented to person, place, and time.  Psychiatric:        Mood and Affect: Mood normal.        Behavior: Behavior normal.     RECENT LABS AND TESTS: BMET    Component Value Date/Time   NA 140 06/29/2018 1146   NA 138 04/11/2018 1030   K 4.6 06/29/2018 1146   CL 98 06/29/2018 1146   CO2 38 (H) 06/29/2018 1146   GLUCOSE 91 06/29/2018 1146   BUN 32 (H) 06/29/2018 1146   BUN 66 (H) 04/11/2018 1030   CREATININE 1.07 06/29/2018 1146   CREATININE 1.19 02/28/2015 1548   CALCIUM 9.4 06/29/2018 1146   GFRNONAA 55 (L) 04/26/2018 1802   GFRAA >60 04/26/2018 1802   Lab Results  Component Value Date   HGBA1C 5.6 08/22/2018   HGBA1C 5.5 04/11/2018    HGBA1C 5.5 12/06/2017   HGBA1C 5.8 11/24/2017   HGBA1C 6.0 07/28/2017   Lab Results  Component Value Date   INSULIN 15.2 08/22/2018   INSULIN 15.2 04/11/2018   INSULIN 21.5 12/06/2017   CBC    Component Value Date/Time   WBC 8.6 06/29/2018 1146   RBC 4.00 (L) 06/29/2018 1146   HGB 12.1 (L) 06/29/2018 1146   HCT 37.6 (L) 06/29/2018 1146   PLT 157.0 06/29/2018 1146   MCV 93.9 06/29/2018 1146   MCH 29.7 04/26/2018 1802   MCHC 32.1 06/29/2018 1146   RDW 16.7 (H) 06/29/2018 1146   LYMPHSABS 1.3 07/28/2017 1628   MONOABS 0.5 07/28/2017 1628   EOSABS 0.1 07/28/2017 1628   BASOSABS 0.1 07/28/2017 1628   Iron/TIBC/Ferritin/ %Sat No results found for: IRON, TIBC, FERRITIN, IRONPCTSAT Lipid Panel     Component Value Date/Time   CHOL 140 08/22/2018 0951   TRIG 126 08/22/2018 0951   HDL 30 (L) 08/22/2018 0951   CHOLHDL 4 11/24/2017 1633   VLDL 29.6 11/24/2017 1633   LDLCALC 85 08/22/2018 0951   LDLDIRECT 85.0 07/28/2017 1628   Hepatic Function Panel     Component Value Date/Time   PROT 6.3 06/29/2018 1146   PROT 5.9 (L) 04/11/2018 1030   ALBUMIN 3.7 06/29/2018 1146   ALBUMIN 3.5 04/11/2018 1030   AST 18 06/29/2018 1146   ALT 18 06/29/2018 1146   ALKPHOS 76 06/29/2018 1146   BILITOT 0.4 06/29/2018 1146   BILITOT 0.4 04/11/2018 1030   BILIDIR 0.1 02/21/2015 0921   IBILI 0.4 04/19/2014 1418      Component Value Date/Time   TSH 1.71 11/24/2017 1633   TSH 1.30 07/28/2017 1628   TSH 1.18 01/25/2017 1415    ASSESSMENT AND PLAN: Prediabetes  Class 3 severe obesity with serious comorbidity  and body mass index (BMI) of 45.0 to 49.9 in adult, unspecified obesity type [name])  PLAN:  [name] will with meal plan, and he will continue to work on weight loss, exercise, and decreasing simple carbohydrates in his diet to help decrease the risk of diabetes. We dicussed metformin including benefits and risks. He was informed that eating too many simple carbohydrates or  too many calories at one sitting increases the likelihood of GI side effects. [name] declined metformin for now and a prescription was not written today. [name] agrees to follow up with our clinic in 3 weeks as directed to monitor his progress.  Obesity [name] is currently in the action stage of change. As such, his goal is to continue with weight loss efforts He has agreed to follow the Category 3 [name] has been instructed to work up to a goal of 150 minutes of combined cardio and strengthening exercise per week for weight loss and overall health benefits. We discussed the following Behavioral Modification Strategies today: increasing lean protein intake, decreasing simple carbohydrates, and planning for success    [name] has agreed to follow up with our clinic in 3 weeks. He was informed of the importance of frequent follow up visits to maximize his success with intensive lifestyle modifications for his multiple health conditions.   OBESITY BEHAVIORAL INTERVENTION VISIT  Today's visit was # 14  Starting weight: 385 lbs Starting date: 12/06/17 Today's weight : 357 lbs  Today's date: 09/11/2018 Total lbs lost to date: 28 At least 15 minutes were spent on discussing the following behavioral intervention visit.   ASK: We discussed the diagnosis of obesity with [name] agreed to give [name] permission to discuss obesity behavioral modification therapy today.  ASSESS: [name] has the diagnosis of obesity and his BMI today is 56.82 [name] is in the action stage of change   ADVISE: [name] was educated on the multiple health risks of obesity as well as the benefit of weight loss to improve his health. He was advised of the need for long term treatment and the importance of lifestyle modifications to improve his current health and to decrease his risk of future health problems.  AGREE: Multiple dietary modification options and treatment options were discussed and  [name]  agreed to follow the recommendations documented in the above note.  ARRANGE: [name] was educated on the importance of frequent visits to treat obesity as outlined per CMS and USPSTF guidelines and agreed to schedule his next follow up appointment today.  [name], am acting as Location manager for [employer], FNP-C.  I have reviewed the above documentation for accuracy and completeness, and I agree with the above.  - Lance Sansoucie, FNP-C.

## 2018-09-17 DIAGNOSIS — R0602 Shortness of breath: Secondary | ICD-10-CM | POA: Diagnosis not present

## 2018-09-17 DIAGNOSIS — G4733 Obstructive sleep apnea (adult) (pediatric): Secondary | ICD-10-CM | POA: Diagnosis not present

## 2018-09-18 ENCOUNTER — Encounter (INDEPENDENT_AMBULATORY_CARE_PROVIDER_SITE_OTHER): Payer: Self-pay | Admitting: Family Medicine

## 2018-09-19 ENCOUNTER — Telehealth: Payer: Self-pay

## 2018-09-19 NOTE — Telephone Encounter (Signed)
Spoke with pt regarding AF episode >2 days on remote transmission informed pt that AF increases pts risk of a stroke and that pt needed an apt to discuss [place]. Pt agreeable to apt with AF clinic on 09/25/2018 at 11:30, number to AF clinic given for directions and parking deck code.

## 2018-09-21 DIAGNOSIS — G4733 Obstructive sleep apnea (adult) (pediatric): Secondary | ICD-10-CM | POA: Diagnosis not present

## 2018-09-21 DIAGNOSIS — J449 Chronic obstructive pulmonary disease, unspecified: Secondary | ICD-10-CM | POA: Diagnosis not present

## 2018-09-21 DIAGNOSIS — J9622 Acute and chronic respiratory failure with hypercapnia: Secondary | ICD-10-CM | POA: Diagnosis not present

## 2018-09-21 DIAGNOSIS — R0602 Shortness of breath: Secondary | ICD-10-CM | POA: Diagnosis not present

## 2018-09-25 ENCOUNTER — Encounter (HOSPITAL_COMMUNITY): Payer: Self-pay | Admitting: Nurse Practitioner

## 2018-09-25 ENCOUNTER — Ambulatory Visit (HOSPITAL_COMMUNITY)
Admission: RE | Admit: 2018-09-25 | Discharge: 2018-09-25 | Disposition: A | Discharge status: Home / Self Care | Payer: PPO | Source: Ambulatory Visit | Attending: Nurse Practitioner | Admitting: Nurse Practitioner

## 2018-09-25 VITALS — BP 128/74 | HR 69 | Ht 74.0 in | Wt 348.0 lb

## 2018-09-25 DIAGNOSIS — I4430 Unspecified atrioventricular block: Secondary | ICD-10-CM | POA: Diagnosis not present

## 2018-09-25 DIAGNOSIS — I4819 Other persistent atrial fibrillation: Secondary | ICD-10-CM | POA: Insufficient documentation

## 2018-09-25 DIAGNOSIS — I4891 Unspecified atrial fibrillation: Secondary | ICD-10-CM | POA: Diagnosis not present

## 2018-09-25 DIAGNOSIS — Z7901 Long term (current) use of anticoagulants: Secondary | ICD-10-CM | POA: Diagnosis not present

## 2018-09-25 DIAGNOSIS — R9431 Abnormal electrocardiogram [ECG] [EKG]: Secondary | ICD-10-CM | POA: Diagnosis not present

## 2018-09-25 DIAGNOSIS — G629 Polyneuropathy, unspecified: Secondary | ICD-10-CM | POA: Diagnosis not present

## 2018-09-25 DIAGNOSIS — Z7982 Long term (current) use of aspirin: Secondary | ICD-10-CM | POA: Insufficient documentation

## 2018-09-25 DIAGNOSIS — I350 Nonrheumatic aortic (valve) stenosis: Secondary | ICD-10-CM | POA: Diagnosis not present

## 2018-09-25 DIAGNOSIS — E785 Hyperlipidemia, unspecified: Secondary | ICD-10-CM | POA: Insufficient documentation

## 2018-09-25 DIAGNOSIS — Z96651 Presence of right artificial knee joint: Secondary | ICD-10-CM | POA: Diagnosis not present

## 2018-09-25 DIAGNOSIS — Z87891 Personal history of nicotine dependence: Secondary | ICD-10-CM | POA: Diagnosis not present

## 2018-09-25 DIAGNOSIS — M109 Gout, unspecified: Secondary | ICD-10-CM | POA: Insufficient documentation

## 2018-09-25 DIAGNOSIS — Z79899 Other long term (current) drug therapy: Secondary | ICD-10-CM | POA: Insufficient documentation

## 2018-09-25 DIAGNOSIS — Z7902 Long term (current) use of antithrombotics/antiplatelets: Secondary | ICD-10-CM | POA: Diagnosis not present

## 2018-09-25 DIAGNOSIS — G4733 Obstructive sleep apnea (adult) (pediatric): Secondary | ICD-10-CM | POA: Insufficient documentation

## 2018-09-25 DIAGNOSIS — Z952 Presence of prosthetic heart valve: Secondary | ICD-10-CM | POA: Diagnosis not present

## 2018-09-25 LAB — CBC
HCT: 39.9 % (ref 39.0–52.0)
Hemoglobin: 12.1 g/dL — ABNORMAL LOW (ref 13.0–17.0)
MCH: 29 pg (ref 26.0–34.0)
MCHC: 30.3 g/dL (ref 30.0–36.0)
MCV: 95.7 fL (ref 80.0–100.0)
Platelets: 150 10*3/uL (ref 150–400)
RBC: 4.17 MIL/uL — ABNORMAL LOW (ref 4.22–5.81)
RDW: 16.9 % — ABNORMAL HIGH (ref 11.5–15.5)
WBC: 7.5 10*3/uL (ref 4.0–10.5)
nRBC: 0 % (ref 0.0–0.2)

## 2018-09-25 MED ORDER — APIXABAN 5 MG PO TABS: 5.0000 mg | ORAL_TABLET | Freq: Two times a day (BID) | ORAL | 0 refills | Status: DC

## 2018-09-25 NOTE — Progress Notes (Signed)
Primary Care Physician: [name], MD Referring Physician: Device clinic Cardiologist: [name] is a 78 y.o. male with a h/o morbid obesity, severe aortic stenosis, s/p TAVR in 2016, PPM for AV block, 04/27/18. She was referred to the afib clinic after device clinic noted afib x 2 days. He has a CHA2DS2VASc score of at least 2 . He has been on asa and Plavix since TAVR and states that he bleeds easily. He often will see blood on the toilet paper or in the toilet after a bowel movement. He denies any awareness of afib, no alcohol, some caffeine. Wears cpap.   Today, he denies symptoms of palpitations, chest pain, shortness of breath, orthopnea, PND, lower extremity edema, dizziness, presyncope, syncope, or neurologic sequela. The patient is tolerating medications without difficulties and is otherwise without complaint today.   Past Medical History:  Diagnosis Date  . Anemia   . Aortic stenosis   . Aortic stenosis, severe    S/p Edwards Sapien 3 Transcatheter Heart Valve (size 26 mm, model # [id number], serial # [id number])  . Arthritis   . Back pain   . Bursitis   . Cataract    left immature  . Cellulitis 10/12/2015  . Complication of anesthesia    Halucinations  . Constipation    takes Miralax daily as well as Senokot daily  . DDD (degenerative disc disease)   . Gout    takes Allopurinol daily  . Heart valve disorder   . History of blood clots 1962   knee  . History of blood transfusion    no abnormal reaction noted  . History of shingles   . Hyperlipidemia    takes Atorvastatin daily  . Hypertension    takes Lisinopril daily  . Joint pain   . Low back pain 01/25/2017  . Neck pain on left side 01/25/2017  . OSA (obstructive sleep apnea)   . Osteoarthritis   . Peripheral edema    takes Furosemide daily  . Peripheral neuropathy    takes Gabapentin daily  . Peroneal palsy    significant right foot drop  . Pneumonia 25+yrs ago   hx of  . Shortness of  breath   . Swelling of extremity   . Urinary frequency   . Urinary urgency   . Valvular heart disease    Past Surgical History:  Procedure Laterality Date  . CARDIAC CATHETERIZATION N/A 05/02/2015   Procedure: Right/Left Heart Cath and Coronary Angiography;  Surgeon: Burnell Blanks, MD;  Location: [place] [facility];  Service: Cardiovascular;  Laterality: N/A;  . COLONOSCOPY N/A 02/07/2013   Procedure: COLONOSCOPY;  Surgeon: Juanita Craver, MD;  Location[facility] ENDOSCOPY;  Service: Endoscopy;  Laterality: N/A;  . COLONOSCOPY N/A 02/09/2013   Procedure: COLONOSCOPY;  Surgeon: Beryle Beams, MD;  Location: [place];  Service: Endoscopy;  Laterality: N/A;  . ESOPHAGOGASTRODUODENOSCOPY N/A 02/07/2013   Procedure: ESOPHAGOGASTRODUODENOSCOPY (EGD);  Surgeon: Juanita Craver, MD;  Location: [facility] ENDOSCOPY;  Service: Endoscopy;  Laterality: N/A;  . GIVENS CAPSULE STUDY N/A 02/09/2013   Procedure: GIVENS CAPSULE STUDY;  Surgeon: Beryle Beams, MD;  Location: [place];  Service: Endoscopy;  Laterality: N/A;  . HERNIA REPAIR     umbilical hernia  . KNEE SURGERY Right    multiple knee surgeries due to complication of R TKA  . LAMINOTOMY  6222   c6-t2  . PACEMAKER IMPLANT N/A 04/27/2018   Procedure: PACEMAKER IMPLANT;  Surgeon: Curt Bears, Will  Hassell Done, MD;  Location: [place] [facility];  Service: Cardiovascular;  Laterality: N/A;  . SPINE SURGERY    . TEE WITHOUT CARDIOVERSION N/A 03/07/2015   Procedure: TRANSESOPHAGEAL ECHOCARDIOGRAM (TEE);  Surgeon: Josue Hector, MD;  Location: [facility] ENDOSCOPY;  Service: Cardiovascular;  Laterality: N/A;  ANES TO BRING PROPOFOL PER DOCTOR  . TEE WITHOUT CARDIOVERSION N/A 06/10/2015   Procedure: TRANSESOPHAGEAL ECHOCARDIOGRAM (TEE);  Surgeon: Burnell Blanks, MD;  Location: [place];  Service: Open Heart Surgery;  Laterality: N/A;  . TOTAL KNEE ARTHROPLASTY     right  . TRANSCATHETER AORTIC VALVE REPLACEMENT, TRANSFEMORAL Left 06/10/2015   Procedure: TRANSCATHETER  AORTIC VALVE REPLACEMENT, TRANSFEMORAL;  Surgeon: Burnell Blanks, MD;  Location: [place];  Service: Open Heart Surgery;  Laterality: Left;    Current Outpatient Medications  Medication Sig Dispense Refill  . acetaminophen (TYLENOL) 500 MG tablet Take 1,000 mg by mouth 2 (two) times daily. Every day use    . aspirin 81 MG EC tablet TAKE 1 TABLET (81 MG TOTAL) BY MOUTH DAILY. 30 tablet 11  . atorvastatin (LIPITOR) 40 MG tablet Take 1 and a half tablet daily by mouth  (60mg ) 135 tablet 5  . cholecalciferol (VITAMIN D) 1000 UNITS tablet Take 1,000 Units by mouth daily.     . clopidogrel (PLAVIX) 75 MG tablet TAKE 1 TABLET (75 MG TOTAL) BY MOUTH DAILY WITH BREAKFAST. 90 tablet 3  . Coenzyme Q10 (COQ10) 200 MG CAPS Take 200 mg by mouth daily.    [name] ELDERBERRY PO Take by mouth.    . Ferrous Fumarate-Folic Acid (HEMATINIC/FOLIC ACID) 831-5 MG TABS Take 1 tablet by mouth 2 (two) times a week. 90 each 1  . furosemide (LASIX) 20 MG tablet Take 1 tablet (20 mg total) by mouth 2 (two) times daily. 180 tablet 2  . gabapentin (NEURONTIN) 300 MG capsule Take 1 capsule (300 mg total) by mouth 3 (three) times daily. 270 capsule 2  . Misc Natural Products (TART CHERRY ADVANCED PO) Take 1 capsule by mouth daily.    . Multiple Vitamin (MULTIVITAMIN WITH MINERALS) TABS Take 1 tablet by mouth daily.    . polyethylene glycol (MIRALAX / GLYCOLAX) packet Take 17 g by mouth daily.    . sennosides-docusate sodium (SENOKOT-S) 8.6-50 MG tablet Take 1 tablet by mouth 2 (two) times daily.    . vitamin C (ASCORBIC ACID) 500 MG tablet Take 500 mg by mouth daily.    [name] apixaban (ELIQUIS) 5 MG TABS tablet Take 1 tablet (5 mg total) by mouth 2 (two) times daily. 60 tablet 0   No current facility-administered medications for this encounter.     Allergies  Allergen Reactions  . Coumadin [Warfarin Sodium] Other (See Comments)    States he can't be on this-bleeds out    Social History   Socioeconomic History  . Marital  status: Married    Spouse name: [name]  . Number of children: 3  . Years of education: Not on file  . Highest education level: Not on file  Occupational History  . Occupation: retired Investment banker, operational  . Financial resource strain: Not on file  . Food insecurity:    Worry: Not on file    Inability: Not on file  . Transportation needs:    Medical: Not on file    Non-medical: Not on file  Tobacco Use  . Smoking status: Former Smoker    Years: 30.00    Types: Pipe    Last  attempt to quit: 09/20/2006    Years since quitting: 12.0  . Smokeless tobacco: Never Used  Substance and Sexual Activity  . Alcohol use: No    Alcohol/week: 0.0 standard drinks  . Drug use: No  . Sexual activity: Never    Comment: lives with wife, retired, no dietary restrictions  Lifestyle  . Physical activity:    Days per week: Not on file    Minutes per session: Not on file  . Stress: Not on file  Relationships  . Social connections:    Talks on phone: Not on file    Gets together: Not on file    Attends religious service: Not on file    Active member of club or organization: Not on file    Attends meetings of clubs or organizations: Not on file    Relationship status: Not on file  . Intimate partner violence:    Fear of current or ex partner: Not on file    Emotionally abused: Not on file    Physically abused: Not on file    Forced sexual activity: Not on file  Other Topics Concern  . Not on file  Social History Narrative  . Not on file    Family History  Problem Relation Age of Onset  . Other Father        POSSIBLE HEART ATTACK  . Arthritis Sister        "crippling"  . Arthritis Sister     ROS- All systems are reviewed and negative except as per the HPI above  Physical Exam: Vitals:   09/25/18 1101  BP: 128/74  Pulse: 69  Weight: (!) 157.9 kg  Height: 6\' 2"  (1.88 m)   Wt Readings from Last 3 Encounters:  09/25/18 (!) 157.9 kg  09/11/18 (!) 161.9 kg  08/22/18 (!)  161.9 kg    Labs: Lab Results  Component Value Date   NA 140 06/29/2018   K 4.6 06/29/2018   CL 98 06/29/2018   CO2 38 (H) 06/29/2018   GLUCOSE 91 06/29/2018   BUN 32 (H) 06/29/2018   CREATININE 1.07 06/29/2018   CALCIUM 9.4 06/29/2018   PHOS 4.0 02/21/2015   MG 2.2 06/11/2015   Lab Results  Component Value Date   INR 1.32 06/10/2015   Lab Results  Component Value Date   CHOL 140 08/22/2018   HDL 30 (L) 08/22/2018   LDLCALC 85 08/22/2018   TRIG 126 08/22/2018     GEN- The patient is well appearing, alert and oriented x 3 today.   Head- normocephalic, atraumatic Eyes-  Sclera clear, conjunctiva pink Ears- hearing intact Oropharynx- clear Neck- supple, no JVP Lymph- no cervical lymphadenopathy Lungs- Clear to ausculation bilaterally, normal work of breathing Heart- Regular rate and rhythm, no murmurs, rubs or gallops, PMI not laterally displaced GI- soft, NT, ND, + BS Extremities- no clubbing, cyanosis, or edema MS- no significant deformity or atrophy Skin- no rash or lesion Psych- euthymic [name], full affect Neuro- strength and sensation are intact  EKG- v paced at 69 bpm Theodoro Doing RN note reviewed    Assessment and Plan: 1. Afib Persistent for 2 days recently noted on device V paced today so may still be in afib Pt asymptomatic General education re afib and triggers  2. CHA2DS2VASc score of 2 States h/o intermittent rectal bleeding Also bruises easily H/o anemia, improved on oral iron However, he has not mentioned to MD, he will bring up at next visit 1/17 He is currently  on asa/plavix, pt states since  TAVR 2016, I will message Dr. [name] and see if he can one or both for eliquis 5 mg bid  to be added, to minimize  Bleeding Bleeding precautions discussed   3. PPM Per device clinic/Dr. Camnitz  F/u here in 3 weeks  Dr. Charlett Blake 1/17  [name], [facility] [address] [place], [place]  [id number] [phone]

## 2018-09-25 NOTE — Patient Instructions (Signed)
Once we call you with recommendations from Dr. [name] you will start Eliquis 5mg  twice a day

## 2018-09-27 ENCOUNTER — Other Ambulatory Visit (HOSPITAL_COMMUNITY): Payer: Self-pay | Admitting: *Deleted

## 2018-10-02 ENCOUNTER — Encounter (INDEPENDENT_AMBULATORY_CARE_PROVIDER_SITE_OTHER): Payer: Self-pay | Admitting: Physician Assistant

## 2018-10-02 ENCOUNTER — Ambulatory Visit (INDEPENDENT_AMBULATORY_CARE_PROVIDER_SITE_OTHER): Payer: PPO | Admitting: Physician Assistant

## 2018-10-02 VITALS — BP 137/81 | HR 70 | Temp 98.2°F | Ht 74.0 in | Wt 374.0 lb

## 2018-10-02 DIAGNOSIS — E7849 Other hyperlipidemia: Secondary | ICD-10-CM | POA: Diagnosis not present

## 2018-10-02 DIAGNOSIS — Z6841 Body Mass Index (BMI) 40.0 and over, adult: Secondary | ICD-10-CM

## 2018-10-02 DIAGNOSIS — E66813 Obesity, class 3: Secondary | ICD-10-CM

## 2018-10-03 ENCOUNTER — Emergency Department (HOSPITAL_COMMUNITY)
Admission: EM | Admit: 2018-10-03 | Discharge: 2018-10-03 | Disposition: A | Discharge status: Home / Self Care | Payer: PPO | Attending: Emergency Medicine | Admitting: Emergency Medicine

## 2018-10-03 DIAGNOSIS — S90415A Abrasion, left lesser toe(s), initial encounter: Secondary | ICD-10-CM | POA: Diagnosis not present

## 2018-10-03 DIAGNOSIS — Y998 Other external cause status: Secondary | ICD-10-CM | POA: Diagnosis not present

## 2018-10-03 DIAGNOSIS — Z7982 Long term (current) use of aspirin: Secondary | ICD-10-CM | POA: Diagnosis not present

## 2018-10-03 DIAGNOSIS — Z950 Presence of cardiac pacemaker: Secondary | ICD-10-CM | POA: Diagnosis not present

## 2018-10-03 DIAGNOSIS — Z96651 Presence of right artificial knee joint: Secondary | ICD-10-CM | POA: Insufficient documentation

## 2018-10-03 DIAGNOSIS — Z952 Presence of prosthetic heart valve: Secondary | ICD-10-CM | POA: Insufficient documentation

## 2018-10-03 DIAGNOSIS — W228XXA Striking against or struck by other objects, initial encounter: Secondary | ICD-10-CM | POA: Diagnosis not present

## 2018-10-03 DIAGNOSIS — T07XXXA Unspecified multiple injuries, initial encounter: Secondary | ICD-10-CM | POA: Diagnosis not present

## 2018-10-03 DIAGNOSIS — Z7901 Long term (current) use of anticoagulants: Secondary | ICD-10-CM | POA: Insufficient documentation

## 2018-10-03 DIAGNOSIS — Y9389 Activity, other specified: Secondary | ICD-10-CM | POA: Insufficient documentation

## 2018-10-03 DIAGNOSIS — I10 Essential (primary) hypertension: Secondary | ICD-10-CM | POA: Insufficient documentation

## 2018-10-03 DIAGNOSIS — Y929 Unspecified place or not applicable: Secondary | ICD-10-CM | POA: Insufficient documentation

## 2018-10-03 DIAGNOSIS — Z87891 Personal history of nicotine dependence: Secondary | ICD-10-CM | POA: Insufficient documentation

## 2018-10-03 DIAGNOSIS — R58 Hemorrhage, not elsewhere classified: Secondary | ICD-10-CM | POA: Diagnosis not present

## 2018-10-03 DIAGNOSIS — J449 Chronic obstructive pulmonary disease, unspecified: Secondary | ICD-10-CM | POA: Insufficient documentation

## 2018-10-03 DIAGNOSIS — Z79899 Other long term (current) drug therapy: Secondary | ICD-10-CM | POA: Diagnosis not present

## 2018-10-03 DIAGNOSIS — S99922A Unspecified injury of left foot, initial encounter: Secondary | ICD-10-CM | POA: Diagnosis present

## 2018-10-03 NOTE — Discharge Instructions (Addendum)
Leave dressing on for 2 days

## 2018-10-03 NOTE — ED Notes (Signed)
Toe is done bleeding. It is normal for Pt to have no sensation in foot due to botched knee surgery. No other complaints at this time. RN aware.

## 2018-10-03 NOTE — ED Notes (Signed)
Patient verbalizes understanding of discharge instructions. Opportunity for questioning and answers were provided. Armband removed by staff, pt discharged from ED.  

## 2018-10-03 NOTE — Progress Notes (Signed)
Office: [phone]  /  Fax: [phone]   HPI:   Chief Complaint: Bickleton is here to discuss his progress with his obesity treatment plan. He is on the Category 3 plan and is following his eating plan approximately 75 % of the time. He states he is exercising 0 minutes 0 times per week. Taysen states that he struggled to follow the plan over the holidays. He has been eating out quite often for lunch. He is ready to back on track. His weight is (!) 374 lb (169.6 kg) today and has had a weight gain of 17 lbs since his last visit. He has lost 11 lbs since starting treatment with [name].  Hyperlipidemia Carmino has hyperlipidemia and has been trying to improve his cholesterol levels with intensive lifestyle modification including a low saturated fat diet, exercise and weight loss. He denies any chest pain, claudication or myalgias.  ASSESSMENT AND PLAN:  Other hyperlipidemia  Class 3 severe obesity with serious comorbidity and body mass index (BMI) of 45.0 to 49.9 in adult, unspecified obesity type ([place])  PLAN:  Hyperlipidemia Brian was informed of the American Heart Association Guidelines emphasizing intensive lifestyle modifications as the first line treatment for hyperlipidemia. We discussed many lifestyle modifications today in depth, and Wilbert will continue to work on decreasing saturated fats such as fatty red meat, butter and many fried foods. He will also increase vegetables and lean protein in his diet and continue to work on exercise. He agrees to continue with medications and weight loss efforts. Fernado agrees to follow up with our office in 2 weeks.  Obesity Trestin is currently in the action stage of change. As such, his goal is to continue with weight loss efforts He has agreed to follow the Category 3 Sidon has been instructed to work up to a goal of 150 minutes of combined cardio and strengthening exercise per week for weight loss and overall health benefits. We  discussed the following Behavioral Modification Strategies today: increasing lean protein intake and keeping healthy foods in the home.  Barak has agreed to follow up with our clinic in 2 weeks. He was informed of the importance of frequent follow up visits to maximize his success with intensive lifestyle modifications for his multiple health conditions.  ALLERGIES: Allergies  Allergen Reactions  . Coumadin [Warfarin Sodium] Other (See Comments)    States he can't be on this-bleeds out    MEDICATIONS: Current Outpatient Medications on File Prior to Visit  Medication Sig Dispense Refill  . acetaminophen (TYLENOL) 500 MG tablet Take 1,000 mg by mouth 2 (two) times daily. Every day use    . apixaban (ELIQUIS) 5 MG TABS tablet Take 1 tablet (5 mg total) by mouth 2 (two) times daily. 60 tablet 0  . aspirin 81 MG EC tablet TAKE 1 TABLET (81 MG TOTAL) BY MOUTH DAILY. 30 tablet 11  . atorvastatin (LIPITOR) 40 MG tablet Take 1 and a half tablet daily by mouth  (60mg ) 135 tablet 5  . cholecalciferol (VITAMIN D) 1000 UNITS tablet Take 1,000 Units by mouth daily.     . Coenzyme Q10 (COQ10) 200 MG CAPS Take 200 mg by mouth daily.    [name] ELDERBERRY PO Take by mouth.    . Ferrous Fumarate-Folic Acid (HEMATINIC/FOLIC ACID) 476-5 MG TABS Take 1 tablet by mouth 2 (two) times a week. 90 each 1  . furosemide (LASIX) 20 MG tablet Take 1 tablet (20 mg total) by mouth 2 (two) times daily. Harold  tablet 2  . gabapentin (NEURONTIN) 300 MG capsule Take 1 capsule (300 mg total) by mouth 3 (three) times daily. 270 capsule 2  . Misc Natural Products (TART CHERRY ADVANCED PO) Take 1 capsule by mouth daily.    . Multiple Vitamin (MULTIVITAMIN WITH MINERALS) TABS Take 1 tablet by mouth daily.    . polyethylene glycol (MIRALAX / GLYCOLAX) packet Take 17 g by mouth daily.    . sennosides-docusate sodium (SENOKOT-S) 8.6-50 MG tablet Take 1 tablet by mouth 2 (two) times daily.    . vitamin C (ASCORBIC ACID) 500 MG tablet Take  500 mg by mouth daily.     No current facility-administered medications on file prior to visit.     PAST MEDICAL HISTORY: Past Medical History:  Diagnosis Date  . Anemia   . Aortic stenosis   . Aortic stenosis, severe    S/p Edwards Sapien 3 Transcatheter Heart Valve (size 26 mm, model # [id number], serial # [id number])  . Arthritis   . Back pain   . Bursitis   . Cataract    left immature  . Cellulitis 10/12/2015  . Complication of anesthesia    Halucinations  . Constipation    takes Miralax daily as well as Senokot daily  . DDD (degenerative disc disease)   . Gout    takes Allopurinol daily  . Heart valve disorder   . History of blood clots 1962   knee  . History of blood transfusion    no abnormal reaction noted  . History of shingles   . Hyperlipidemia    takes Atorvastatin daily  . Hypertension    takes Lisinopril daily  . Joint pain   . Low back pain 01/25/2017  . Neck pain on left side 01/25/2017  . OSA (obstructive sleep apnea)   . Osteoarthritis   . Peripheral edema    takes Furosemide daily  . Peripheral neuropathy    takes Gabapentin daily  . Peroneal palsy    significant right foot drop  . Pneumonia 25+yrs ago   hx of  . Shortness of breath   . Swelling of extremity   . Urinary frequency   . Urinary urgency   . Valvular heart disease     PAST SURGICAL HISTORY: Past Surgical History:  Procedure Laterality Date  . CARDIAC CATHETERIZATION N/A 05/02/2015   Procedure: Right/Left Heart Cath and Coronary Angiography;  Surgeon: Burnell Blanks, MD;  Location: [place] CV [facility];  Service: Cardiovascular;  Laterality: N/A;  . COLONOSCOPY N/A 02/07/2013   Procedure: COLONOSCOPY;  Surgeon: Juanita Craver, MD;  Location[facility] ENDOSCOPY;  Service: Endoscopy;  Laterality: N/A;  . COLONOSCOPY N/A 02/09/2013   Procedure: COLONOSCOPY;  Surgeon: Beryle Beams, MD;  Location: [place];  Service: Endoscopy;  Laterality: N/A;  . ESOPHAGOGASTRODUODENOSCOPY N/A  02/07/2013   Procedure: ESOPHAGOGASTRODUODENOSCOPY (EGD);  Surgeon: Juanita Craver, MD;  Location: [facility] ENDOSCOPY;  Service: Endoscopy;  Laterality: N/A;  . GIVENS CAPSULE STUDY N/A 02/09/2013   Procedure: GIVENS CAPSULE STUDY;  Surgeon: Beryle Beams, MD;  Location: [place];  Service: Endoscopy;  Laterality: N/A;  . HERNIA REPAIR     umbilical hernia  . KNEE SURGERY Right    multiple knee surgeries due to complication of R TKA  . LAMINOTOMY  2122   c6-t2  . PACEMAKER IMPLANT N/A 04/27/2018   Procedure: PACEMAKER IMPLANT;  Surgeon: Constance Haw, MD;  Location: [place] CV LAB;  Service: Cardiovascular;  Laterality: N/A;  . SPINE SURGERY    .  TEE WITHOUT CARDIOVERSION N/A 03/07/2015   Procedure: TRANSESOPHAGEAL ECHOCARDIOGRAM (TEE);  Surgeon: Josue Hector, MD;  Location: [facility] ENDOSCOPY;  Service: Cardiovascular;  Laterality: N/A;  ANES TO BRING PROPOFOL PER DOCTOR  . TEE WITHOUT CARDIOVERSION N/A 06/10/2015   Procedure: TRANSESOPHAGEAL ECHOCARDIOGRAM (TEE);  Surgeon: Burnell Blanks, MD;  Location: [place];  Service: Open Heart Surgery;  Laterality: N/A;  . TOTAL KNEE ARTHROPLASTY     right  . TRANSCATHETER AORTIC VALVE REPLACEMENT, TRANSFEMORAL Left 06/10/2015   Procedure: TRANSCATHETER AORTIC VALVE REPLACEMENT, TRANSFEMORAL;  Surgeon: Burnell Blanks, MD;  Location: [place];  Service: Open Heart Surgery;  Laterality: Left;    SOCIAL HISTORY: Social History   Tobacco Use  . Smoking status: Former Smoker    Years: 30.00    Types: Pipe    Last attempt to quit: 09/20/2006    Years since quitting: 12.0  . Smokeless tobacco: Never Used  Substance Use Topics  . Alcohol use: No    Alcohol/week: 0.0 standard drinks  . Drug use: No    FAMILY HISTORY: Family History  Problem Relation Age of Onset  . Other Father        POSSIBLE HEART ATTACK  . Arthritis Sister        "crippling"  . Arthritis Sister     ROS: Review of Systems  Constitutional: Negative for weight  loss.  Cardiovascular: Negative for chest pain and claudication.  Musculoskeletal: Negative for myalgias.    PHYSICAL EXAM: Blood pressure 137/81, pulse 70, temperature 98.2 F (36.8 C), temperature source Oral, height 6\' 2"  (1.88 m), weight (!) 374 lb (169.6 kg), SpO2 92 %. Body mass index is 48.02 kg/m. Physical Exam Vitals signs reviewed.  Constitutional:      Appearance: Normal appearance. He is obese.  Cardiovascular:     Rate and Rhythm: Normal rate.  Pulmonary:     Effort: Pulmonary effort is normal.  Musculoskeletal: Normal range of motion.  Skin:    General: Skin is warm and dry.  Neurological:     Mental Status: He is alert and oriented to person, place, and time.  Psychiatric:        Mood and Affect: Mood normal.     RECENT LABS AND TESTS: BMET    Component Value Date/Time   NA 140 06/29/2018 1146   NA 138 04/11/2018 1030   K 4.6 06/29/2018 1146   CL 98 06/29/2018 1146   CO2 38 (H) 06/29/2018 1146   GLUCOSE 91 06/29/2018 1146   BUN 32 (H) 06/29/2018 1146   BUN 66 (H) 04/11/2018 1030   CREATININE 1.07 06/29/2018 1146   CREATININE 1.19 02/28/2015 1548   CALCIUM 9.4 06/29/2018 1146   GFRNONAA 55 (L) 04/26/2018 1802   GFRAA >60 04/26/2018 1802   Lab Results  Component Value Date   HGBA1C 5.6 08/22/2018   HGBA1C 5.5 04/11/2018   HGBA1C 5.5 12/06/2017   HGBA1C 5.8 11/24/2017   HGBA1C 6.0 07/28/2017   Lab Results  Component Value Date   INSULIN 15.2 08/22/2018   INSULIN 15.2 04/11/2018   INSULIN 21.5 12/06/2017   CBC    Component Value Date/Time   WBC 7.5 09/25/2018 1156   RBC 4.17 (L) 09/25/2018 1156   HGB 12.1 (L) 09/25/2018 1156   HCT 39.9 09/25/2018 1156   PLT 150 09/25/2018 1156   MCV 95.7 09/25/2018 1156   MCH 29.0 09/25/2018 1156   MCHC 30.3 09/25/2018 1156   RDW 16.9 (H) 09/25/2018 1156   LYMPHSABS 1.3  07/28/2017 1628   MONOABS 0.5 07/28/2017 1628   EOSABS 0.1 07/28/2017 1628   BASOSABS 0.1 07/28/2017 1628   Iron/TIBC/Ferritin/  %Sat No results found for: IRON, TIBC, FERRITIN, IRONPCTSAT Lipid Panel     Component Value Date/Time   CHOL 140 08/22/2018 0951   TRIG 126 08/22/2018 0951   HDL 30 (L) 08/22/2018 0951   CHOLHDL 4 11/24/2017 1633   VLDL 29.6 11/24/2017 1633   LDLCALC 85 08/22/2018 0951   LDLDIRECT 85.0 07/28/2017 1628   Hepatic Function Panel     Component Value Date/Time   PROT 6.3 06/29/2018 1146   PROT 5.9 (L) 04/11/2018 1030   ALBUMIN 3.7 06/29/2018 1146   ALBUMIN 3.5 04/11/2018 1030   AST 18 06/29/2018 1146   ALT 18 06/29/2018 1146   ALKPHOS 76 06/29/2018 1146   BILITOT 0.4 06/29/2018 1146   BILITOT 0.4 04/11/2018 1030   BILIDIR 0.1 02/21/2015 0921   IBILI 0.4 04/19/2014 1418      Component Value Date/Time   TSH 1.71 11/24/2017 1633   TSH 1.30 07/28/2017 1628   TSH 1.18 01/25/2017 1415      OBESITY BEHAVIORAL INTERVENTION VISIT  Today's visit was # 15   Starting weight: 385 lbs Starting date: 12/06/2017 Today's weight :: (!) 374 lb  Today's date: 10/02/2018 Total lbs lost to date: 11  At least 15 minutes were spent on discussing the following behavioral intervention visit.  ASK: We discussed the diagnosis of obesity with [place] agreed to give [name] permission to discuss obesity behavioral modification therapy today.  ASSESS: Jacoby has the diagnosis of obesity and his BMI today is [place] is in the action stage of change   ADVISE: Zayaan was educated on the multiple health risks of obesity as well as the benefit of weight loss to improve his health. He was advised of the need for long term treatment and the importance of lifestyle modifications to improve his current health and to decrease his risk of future health problems.  AGREE: Multiple dietary modification options and treatment options were discussed and  Wayman agreed to follow the recommendations documented in the above note.  ARRANGE: Deklan was educated on the importance of frequent visits  to treat obesity as outlined per CMS and USPSTF guidelines and agreed to schedule his next follow up appointment today.  I, Tammy Wysor, am acting as Location manager for [employer], PA-CI, Abby Potash, PA-C have reviewed above note and agree with its content

## 2018-10-03 NOTE — ED Notes (Signed)
Pt taken to SUV by this RN along with a second RN to assist. Pt able to ambulate with assistance and get into SUV without complication.

## 2018-10-03 NOTE — ED Triage Notes (Signed)
Pt arrived via [facility] from home after stubbing his right second toe on a nail that was protruding from the carpet/hardwood floor threshold. Toe has minor laceration with uncontrolled bleeding noted after EMS bandage removal. Pt is alert and oriented x4, wheelchair bound at baseline. Pt is on Eliquis.

## 2018-10-03 NOTE — ED Provider Notes (Signed)
[facility] EMERGENCY DEPARTMENT Provider Note   CSN: [id number] Arrival date & time: 10/03/18  2027     History   Chief Complaint Chief Complaint  Patient presents with  . Toe Pain    HPI [name] is a 78 y.o. male.  The history is provided by the patient. No language interpreter was used.  Toe Pain  This is a new problem. The current episode started 1 to 2 hours ago. The problem occurs constantly. The problem has been gradually worsening. Nothing aggravates the symptoms. Nothing relieves the symptoms. He has tried nothing for the symptoms. The treatment provided no relief.  Pt reports he stumped his toe.  Pt is on blood thinners and toe has been bleeding   Past Medical History:  Diagnosis Date  . Anemia   . Aortic stenosis   . Aortic stenosis, severe    S/p Edwards Sapien 3 Transcatheter Heart Valve (size 26 mm, model # [id number], serial # [id number])  . Arthritis   . Back pain   . Bursitis   . Cataract    left immature  . Cellulitis 10/12/2015  . Complication of anesthesia    Halucinations  . Constipation    takes Miralax daily as well as Senokot daily  . DDD (degenerative disc disease)   . Gout    takes Allopurinol daily  . Heart valve disorder   . History of blood clots 1962   knee  . History of blood transfusion    no abnormal reaction noted  . History of shingles   . Hyperlipidemia    takes Atorvastatin daily  . Hypertension    takes Lisinopril daily  . Joint pain   . Low back pain 01/25/2017  . Neck pain on left side 01/25/2017  . OSA (obstructive sleep apnea)   . Osteoarthritis   . Peripheral edema    takes Furosemide daily  . Peripheral neuropathy    takes Gabapentin daily  . Peroneal palsy    significant right foot drop  . Pneumonia 25+yrs ago   hx of  . Shortness of breath   . Swelling of extremity   . Urinary frequency   . Urinary urgency   . Valvular heart disease     Patient Active Problem List   Diagnosis Date  Noted  . Primary osteoarthritis of left shoulder 08/23/2018  . Renal insufficiency 06/29/2018  . Bradycardia 04/26/2018  . Other fatigue 12/06/2017  . Shortness of breath on exertion 12/06/2017  . Essential hypertension 12/06/2017  . Hyperglycemia 07/28/2017  . Neck pain on left side 01/25/2017  . Low back pain 01/25/2017  . Constipation 08/15/2015  . Severe aortic valve stenosis   . Medicare annual wellness visit, subsequent 03/09/2015  . Preventative health care 03/09/2015  . Allergic rhinitis 12/02/2014  . COPD mixed type ([place]) 12/02/2014  . Chronic venous insufficiency 10/02/2013  . Venous stasis dermatitis 10/02/2013  . AVM (arteriovenous malformation) of colon with hemorrhage 02/10/2013  . Thrombocytopenia, unspecified ([place]) 02/09/2013  . EDEMA 04/15/2010  . HYPERCHOLESTEROLEMIA 04/14/2010  . Gout 04/14/2010  . Anemia 04/14/2010  . Aortic valve disorder 04/14/2010  . VALVULAR HEART DISEASE 04/14/2010  . Osteoarthritis 04/14/2010  . Multilevel degenerative disc disease 04/14/2010  . BURSITIS 04/14/2010  . OBESITY, MORBID 03/21/2010  . Obstructive sleep apnea 02/19/2008    Past Surgical History:  Procedure Laterality Date  . CARDIAC CATHETERIZATION N/A 05/02/2015   Procedure: Right/Left Heart Cath and Coronary Angiography;  Surgeon: Burnell Blanks,  MD;  Location: [place] CV LAB;  Service: Cardiovascular;  Laterality: N/A;  . COLONOSCOPY N/A 02/07/2013   Procedure: COLONOSCOPY;  Surgeon: Juanita Craver, MD;  Location: [facility] ENDOSCOPY;  Service: Endoscopy;  Laterality: N/A;  . COLONOSCOPY N/A 02/09/2013   Procedure: COLONOSCOPY;  Surgeon: Beryle Beams, MD;  Location: [place];  Service: Endoscopy;  Laterality: N/A;  . ESOPHAGOGASTRODUODENOSCOPY N/A 02/07/2013   Procedure: ESOPHAGOGASTRODUODENOSCOPY (EGD);  Surgeon: Juanita Craver, MD;  Location: [facility] ENDOSCOPY;  Service: Endoscopy;  Laterality: N/A;  . GIVENS CAPSULE STUDY N/A 02/09/2013   Procedure: GIVENS CAPSULE  STUDY;  Surgeon: Beryle Beams, MD;  Location: [place];  Service: Endoscopy;  Laterality: N/A;  . HERNIA REPAIR     umbilical hernia  . KNEE SURGERY Right    multiple knee surgeries due to complication of R TKA  . LAMINOTOMY  7341   c6-t2  . PACEMAKER IMPLANT N/A 04/27/2018   Procedure: PACEMAKER IMPLANT;  Surgeon: Constance Haw, MD;  Location: [place] CV LAB;  Service: Cardiovascular;  Laterality: N/A;  . SPINE SURGERY    . TEE WITHOUT CARDIOVERSION N/A 03/07/2015   Procedure: TRANSESOPHAGEAL ECHOCARDIOGRAM (TEE);  Surgeon: Josue Hector, MD;  Location: [facility] ENDOSCOPY;  Service: Cardiovascular;  Laterality: N/A;  ANES TO BRING PROPOFOL PER DOCTOR  . TEE WITHOUT CARDIOVERSION N/A 06/10/2015   Procedure: TRANSESOPHAGEAL ECHOCARDIOGRAM (TEE);  Surgeon: Burnell Blanks, MD;  Location: [place];  Service: Open Heart Surgery;  Laterality: N/A;  . TOTAL KNEE ARTHROPLASTY     right  . TRANSCATHETER AORTIC VALVE REPLACEMENT, TRANSFEMORAL Left 06/10/2015   Procedure: TRANSCATHETER AORTIC VALVE REPLACEMENT, TRANSFEMORAL;  Surgeon: Burnell Blanks, MD;  Location: [place];  Service: Open Heart Surgery;  Laterality: Left;        Home Medications    Prior to Admission medications   Medication Sig Start Date End Date Taking? Authorizing Provider  acetaminophen (TYLENOL) 500 MG tablet Take 1,000 mg by mouth 2 (two) times daily. Every day use    [provider]  apixaban (ELIQUIS) 5 MG TABS tablet Take 1 tablet (5 mg total) by mouth 2 (two) times daily. 09/25/18   Sherran Needs, NP  aspirin 81 MG EC tablet TAKE 1 TABLET (81 MG TOTAL) BY MOUTH DAILY. 06/01/16   Josue Hector, MD  atorvastatin (LIPITOR) 40 MG tablet Take 1 and a half tablet daily by mouth  (60mg ) 05/23/18   [name], MD  cholecalciferol (VITAMIN D) 1000 UNITS tablet Take 1,000 Units by mouth daily.     [provider]  Coenzyme Q10 (COQ10) 200 MG CAPS Take 200 mg by mouth daily.    [provider]  ELDERBERRY PO Take by mouth.    [provider]  Ferrous Fumarate-Folic Acid (HEMATINIC/FOLIC ACID) 937-9 MG TABS Take 1 tablet by mouth 2 (two) times a week. 06/29/18   [name], MD  furosemide (LASIX) 20 MG tablet Take 1 tablet (20 mg total) by mouth 2 (two) times daily. 09/05/18   Josue Hector, MD  gabapentin (NEURONTIN) 300 MG capsule Take 1 capsule (300 mg total) by mouth 3 (three) times daily. 05/23/18   [name], MD  Misc Natural Products (TART CHERRY ADVANCED PO) Take 1 capsule by mouth daily.    [provider]  Multiple Vitamin (MULTIVITAMIN WITH MINERALS) TABS Take 1 tablet by mouth daily.    [provider]  polyethylene glycol (MIRALAX / GLYCOLAX) packet Take 17 g by  mouth daily.    [provider]  sennosides-docusate sodium (SENOKOT-S) 8.6-50 MG tablet Take 1 tablet by mouth 2 (two) times daily.    [provider]  vitamin C (ASCORBIC ACID) 500 MG tablet Take 500 mg by mouth daily.    [provider]    Family History Family History  Problem Relation Age of Onset  . Other Father        POSSIBLE HEART ATTACK  . Arthritis Sister        "crippling"  . Arthritis Sister     Social History Social History   Tobacco Use  . Smoking status: Former Smoker    Years: 30.00    Types: Pipe    Last attempt to quit: 09/20/2006    Years since quitting: 12.0  . Smokeless tobacco: Never Used  Substance Use Topics  . Alcohol use: No    Alcohol/week: 0.0 standard drinks  . Drug use: No     Allergies   Coumadin [warfarin sodium]   Review of Systems Review of Systems  Skin: Positive for wound.  All other systems reviewed and are negative.    Physical Exam Updated Vital Signs There were no vitals taken for this visit.  Physical Exam Vitals signs and nursing note reviewed.  Constitutional:      Appearance: He is well-developed.  HENT:     Head: Normocephalic.  Cardiovascular:     Rate  and Rhythm: Normal rate.  Pulmonary:     Effort: Pulmonary effort is normal.  Abdominal:     General: There is no distension.  Musculoskeletal: Normal range of motion.  Skin:    General: Skin is warm.     Comments: 1x19mm puncture wound 2nd toe  From nv and ns intact   Neurological:     General: No focal deficit present.     Mental Status: He is alert and oriented to person, place, and time.  Psychiatric:        Mood and Affect: Mood normal.      ED Treatments / Results  Labs (all labs ordered are listed, but only abnormal results are displayed) Labs Reviewed - No data to display  EKG None  Radiology No results found.  Procedures Procedures (including critical care time)  Medications Ordered in ED Medications - No data to display   Initial Impression / Assessment and Plan / ED Course  I have reviewed the triage vital signs and the nursing notes.  Pertinent labs & imaging results that were available during my care of the patient were reviewed by me and considered in my medical decision making (see chart for details).     Quickclot to wound, bandaged,   Wound undressed and bleeding stopped   Final Clinical Impressions(s) / ED Diagnoses   Final diagnoses:  Abrasion of second toe of left foot, initial encounter    ED Discharge Orders    None    An After Visit Summary was printed and given to the patient.    Fransico Meadow, Hershal Coria 10/03/18 2148    Deno Etienne, DO 10/03/18 2244

## 2018-10-03 NOTE — ED Notes (Signed)
Patient Alert and oriented to baseline. Stable and ambulatory to baseline. Patient verbalized understanding of the discharge instructions.  Patient belongings were taken by the patient.   

## 2018-10-06 ENCOUNTER — Ambulatory Visit (INDEPENDENT_AMBULATORY_CARE_PROVIDER_SITE_OTHER): Payer: PPO | Admitting: Family Medicine

## 2018-10-06 ENCOUNTER — Ambulatory Visit (HOSPITAL_BASED_OUTPATIENT_CLINIC_OR_DEPARTMENT_OTHER)
Admission: RE | Admit: 2018-10-06 | Discharge: 2018-10-06 | Disposition: A | Discharge status: Home / Self Care | Payer: PPO | Source: Ambulatory Visit | Attending: Family Medicine | Admitting: Family Medicine

## 2018-10-06 VITALS — BP 137/67 | HR 68 | Temp 98.1°F | Resp 18 | Ht 74.0 in | Wt 374.0 lb

## 2018-10-06 DIAGNOSIS — R001 Bradycardia, unspecified: Secondary | ICD-10-CM | POA: Diagnosis not present

## 2018-10-06 DIAGNOSIS — R739 Hyperglycemia, unspecified: Secondary | ICD-10-CM | POA: Diagnosis not present

## 2018-10-06 DIAGNOSIS — I10 Essential (primary) hypertension: Secondary | ICD-10-CM

## 2018-10-06 DIAGNOSIS — N289 Disorder of kidney and ureter, unspecified: Secondary | ICD-10-CM | POA: Diagnosis not present

## 2018-10-06 DIAGNOSIS — R609 Edema, unspecified: Secondary | ICD-10-CM | POA: Diagnosis not present

## 2018-10-06 DIAGNOSIS — R0602 Shortness of breath: Secondary | ICD-10-CM

## 2018-10-06 DIAGNOSIS — E7800 Pure hypercholesterolemia, unspecified: Secondary | ICD-10-CM | POA: Diagnosis not present

## 2018-10-06 NOTE — Progress Notes (Signed)
1 

## 2018-10-06 NOTE — Patient Instructions (Signed)

## 2018-10-09 NOTE — Assessment & Plan Note (Signed)
Hydrate and monitor 

## 2018-10-09 NOTE — Assessment & Plan Note (Signed)
Check CXR  

## 2018-10-09 NOTE — Progress Notes (Signed)
Subjective:    Patient ID: [name], male    DOB: 12/02/1940, 78 y.o.   MRN: [id number]  No chief complaint on file.   HPI Patient is in today for follow up. He is doing well now. He has a pacer and is not experiencing any chest pain or palpitations. He notes shortness of breath. No recent febrile illness or hospitalizations. He continues to be wheelchair bound but no significant pain. Denies CP/palp/HA/congestion/fevers/GI or GU c/o. Taking meds as prescribed  Past Medical History:  Diagnosis Date  . Anemia   . Aortic stenosis   . Aortic stenosis, severe    S/p Edwards Sapien 3 Transcatheter Heart Valve (size 26 mm, model # [id number], serial # [id number])  . Arthritis   . Back pain   . Bursitis   . Cataract    left immature  . Cellulitis 10/12/2015  . Complication of anesthesia    Halucinations  . Constipation    takes Miralax daily as well as Senokot daily  . DDD (degenerative disc disease)   . Gout    takes Allopurinol daily  . Heart valve disorder   . History of blood clots 1962   knee  . History of blood transfusion    no abnormal reaction noted  . History of shingles   . Hyperlipidemia    takes Atorvastatin daily  . Hypertension    takes Lisinopril daily  . Joint pain   . Low back pain 01/25/2017  . Neck pain on left side 01/25/2017  . OSA (obstructive sleep apnea)   . Osteoarthritis   . Peripheral edema    takes Furosemide daily  . Peripheral neuropathy    takes Gabapentin daily  . Peroneal palsy    significant right foot drop  . Pneumonia 25+yrs ago   hx of  . Shortness of breath   . Swelling of extremity   . Urinary frequency   . Urinary urgency   . Valvular heart disease     Past Surgical History:  Procedure Laterality Date  . CARDIAC CATHETERIZATION N/A 05/02/2015   Procedure: Right/Left Heart Cath and Coronary Angiography;  Surgeon: Burnell Blanks, MD;  Location: [place] [facility];  Service: Cardiovascular;  Laterality: N/A;  .  COLONOSCOPY N/A 02/07/2013   Procedure: COLONOSCOPY;  Surgeon: Juanita Craver, MD;  Location: [facility] ENDOSCOPY;  Service: Endoscopy;  Laterality: N/A;  . COLONOSCOPY N/A 02/09/2013   Procedure: COLONOSCOPY;  Surgeon: Beryle Beams, MD;  Location: [place];  Service: Endoscopy;  Laterality: N/A;  . ESOPHAGOGASTRODUODENOSCOPY N/A 02/07/2013   Procedure: ESOPHAGOGASTRODUODENOSCOPY (EGD);  Surgeon: Juanita Craver, MD;  Location: [facility] ENDOSCOPY;  Service: Endoscopy;  Laterality: N/A;  . GIVENS CAPSULE STUDY N/A 02/09/2013   Procedure: GIVENS CAPSULE STUDY;  Surgeon: Beryle Beams, MD;  Location: [place];  Service: Endoscopy;  Laterality: N/A;  . HERNIA REPAIR     umbilical hernia  . KNEE SURGERY Right    multiple knee surgeries due to complication of R TKA  . LAMINOTOMY  7829   c6-t2  . PACEMAKER IMPLANT N/A 04/27/2018   Procedure: PACEMAKER IMPLANT;  Surgeon: Constance Haw, MD;  Location: [place] [facility];  Service: Cardiovascular;  Laterality: N/A;  . SPINE SURGERY    . TEE WITHOUT CARDIOVERSION N/A 03/07/2015   Procedure: TRANSESOPHAGEAL ECHOCARDIOGRAM (TEE);  Surgeon: Josue Hector, MD;  Location: [facility] ENDOSCOPY;  Service: Cardiovascular;  Laterality: N/A;  ANES TO BRING PROPOFOL PER DOCTOR  . TEE WITHOUT CARDIOVERSION N/A  06/10/2015   Procedure: TRANSESOPHAGEAL ECHOCARDIOGRAM (TEE);  Surgeon: Burnell Blanks, MD;  Location: [place];  Service: Open Heart Surgery;  Laterality: N/A;  . TOTAL KNEE ARTHROPLASTY     right  . TRANSCATHETER AORTIC VALVE REPLACEMENT, TRANSFEMORAL Left 06/10/2015   Procedure: TRANSCATHETER AORTIC VALVE REPLACEMENT, TRANSFEMORAL;  Surgeon: Burnell Blanks, MD;  Location: [place];  Service: Open Heart Surgery;  Laterality: Left;    Family History  Problem Relation Age of Onset  . Other Father        POSSIBLE HEART ATTACK  . Arthritis Sister        "crippling"  . Arthritis Sister     Social History   Socioeconomic History  . Marital status: Married      Spouse name: [name]  . Number of children: 3  . Years of education: Not on file  . Highest education level: Not on file  Occupational History  . Occupation: retired Investment banker, operational  . Financial resource strain: Not on file  . Food insecurity:    Worry: Not on file    Inability: Not on file  . Transportation needs:    Medical: Not on file    Non-medical: Not on file  Tobacco Use  . Smoking status: Former Smoker    Years: 30.00    Types: Pipe    Last attempt to quit: 09/20/2006    Years since quitting: 12.0  . Smokeless tobacco: Never Used  Substance and Sexual Activity  . Alcohol use: No    Alcohol/week: 0.0 standard drinks  . Drug use: No  . Sexual activity: Never    Comment: lives with wife, retired, no dietary restrictions  Lifestyle  . Physical activity:    Days per week: Not on file    Minutes per session: Not on file  . Stress: Not on file  Relationships  . Social connections:    Talks on phone: Not on file    Gets together: Not on file    Attends religious service: Not on file    Active member of club or organization: Not on file    Attends meetings of clubs or organizations: Not on file    Relationship status: Not on file  . Intimate partner violence:    Fear of current or ex partner: Not on file    Emotionally abused: Not on file    Physically abused: Not on file    Forced sexual activity: Not on file  Other Topics Concern  . Not on file  Social History Narrative  . Not on file    Outpatient Medications Prior to Visit  Medication Sig Dispense Refill  . acetaminophen (TYLENOL) 500 MG tablet Take 1,000 mg by mouth 2 (two) times daily. Every day use    . apixaban (ELIQUIS) 5 MG TABS tablet Take 1 tablet (5 mg total) by mouth 2 (two) times daily. 60 tablet 0  . aspirin 81 MG EC tablet TAKE 1 TABLET (81 MG TOTAL) BY MOUTH DAILY. 30 tablet 11  . atorvastatin (LIPITOR) 40 MG tablet Take 1 and a half tablet daily by mouth  (60mg ) 135 tablet 5  .  cholecalciferol (VITAMIN D) 1000 UNITS tablet Take 1,000 Units by mouth daily.     . Coenzyme Q10 (COQ10) 200 MG CAPS Take 200 mg by mouth daily.    [name] ELDERBERRY PO Take by mouth.    . Ferrous Fumarate-Folic Acid (HEMATINIC/FOLIC ACID) 829-9 MG TABS Take 1 tablet by mouth 2 (  two) times a week. 90 each 1  . furosemide (LASIX) 20 MG tablet Take 1 tablet (20 mg total) by mouth 2 (two) times daily. 180 tablet 2  . gabapentin (NEURONTIN) 300 MG capsule Take 1 capsule (300 mg total) by mouth 3 (three) times daily. 270 capsule 2  . Misc Natural Products (TART CHERRY ADVANCED PO) Take 1 capsule by mouth daily.    . Multiple Vitamin (MULTIVITAMIN WITH MINERALS) TABS Take 1 tablet by mouth daily.    . polyethylene glycol (MIRALAX / GLYCOLAX) packet Take 17 g by mouth daily.    . sennosides-docusate sodium (SENOKOT-S) 8.6-50 MG tablet Take 1 tablet by mouth 2 (two) times daily.    . vitamin C (ASCORBIC ACID) 500 MG tablet Take 500 mg by mouth daily.     No facility-administered medications prior to visit.     Allergies  Allergen Reactions  . Coumadin [Warfarin Sodium] Other (See Comments)    States he can't be on this-bleeds out    Review of Systems  Constitutional: Negative for fever and malaise/fatigue.  HENT: Positive for congestion.   Eyes: Negative for blurred vision.  Respiratory: Positive for cough and shortness of breath.   Cardiovascular: Positive for leg swelling. Negative for chest pain and palpitations.  Gastrointestinal: Negative for abdominal pain, blood in stool and nausea.  Genitourinary: Negative for dysuria and frequency.  Musculoskeletal: Negative for falls.  Skin: Negative for rash.  Neurological: Negative for dizziness, loss of consciousness and headaches.  Endo/Heme/Allergies: Negative for environmental allergies.  Psychiatric/Behavioral: Negative for depression. The patient is not nervous/anxious.        Objective:    Physical Exam Vitals signs and nursing note  reviewed.  Constitutional:      General: He is not in acute distress.    Appearance: He is well-developed.  HENT:     Head: Normocephalic and atraumatic.     Nose: Nose normal.  Eyes:     General:        Right eye: No discharge.        Left eye: No discharge.  Neck:     Musculoskeletal: Normal range of motion and neck supple.  Cardiovascular:     Rate and Rhythm: Normal rate and regular rhythm.     Heart sounds: No murmur.  Pulmonary:     Effort: Pulmonary effort is normal.     Breath sounds: Normal breath sounds.  Abdominal:     General: Bowel sounds are normal.     Palpations: Abdomen is soft.     Tenderness: There is no abdominal tenderness.  Musculoskeletal:     Right lower leg: Edema present.     Left lower leg: Edema present.  Skin:    General: Skin is warm and dry.  Neurological:     Mental Status: He is alert and oriented to person, place, and time.     BP 137/67 (BP Location: Left Arm, Patient Position: Sitting, Cuff Size: Normal)   Pulse 68   Temp 98.1 F (36.7 C) (Oral)   Resp 18   Ht 6\' 2"  (1.88 m)   Wt (!) 374 lb (169.6 kg)   SpO2 92%   BMI 48.02 kg/m  Wt Readings from Last 3 Encounters:  10/06/18 (!) 374 lb (169.6 kg)  10/02/18 (!) 374 lb (169.6 kg)  09/25/18 (!) 348 lb (157.9 kg)     Lab Results  Component Value Date   WBC 7.5 09/25/2018   HGB 12.1 (L) 09/25/2018   HCT  39.9 09/25/2018   PLT 150 09/25/2018   GLUCOSE 91 06/29/2018   CHOL 140 08/22/2018   TRIG 126 08/22/2018   HDL 30 (L) 08/22/2018   LDLDIRECT 85.0 07/28/2017   LDLCALC 85 08/22/2018   ALT 18 06/29/2018   AST 18 06/29/2018   NA 140 06/29/2018   K 4.6 06/29/2018   CL 98 06/29/2018   CREATININE 1.07 06/29/2018   BUN 32 (H) 06/29/2018   CO2 38 (H) 06/29/2018   TSH 1.71 11/24/2017   INR 1.32 06/10/2015   HGBA1C 5.6 08/22/2018    Lab Results  Component Value Date   TSH 1.71 11/24/2017   Lab Results  Component Value Date   WBC 7.5 09/25/2018   HGB 12.1 (L)  09/25/2018   HCT 39.9 09/25/2018   MCV 95.7 09/25/2018   PLT 150 09/25/2018   Lab Results  Component Value Date   NA 140 06/29/2018   K 4.6 06/29/2018   CO2 38 (H) 06/29/2018   GLUCOSE 91 06/29/2018   BUN 32 (H) 06/29/2018   CREATININE 1.07 06/29/2018   BILITOT 0.4 06/29/2018   ALKPHOS 76 06/29/2018   AST 18 06/29/2018   ALT 18 06/29/2018   PROT 6.3 06/29/2018   ALBUMIN 3.7 06/29/2018   CALCIUM 9.4 06/29/2018   ANIONGAP 12 04/26/2018   GFR 71.43 06/29/2018   Lab Results  Component Value Date   CHOL 140 08/22/2018   Lab Results  Component Value Date   HDL 30 (L) 08/22/2018   Lab Results  Component Value Date   LDLCALC 85 08/22/2018   Lab Results  Component Value Date   TRIG 126 08/22/2018   Lab Results  Component Value Date   CHOLHDL 4 11/24/2017   Lab Results  Component Value Date   HGBA1C 5.6 08/22/2018       Assessment & Plan:   Problem List Items Addressed This Visit    HYPERCHOLESTEROLEMIA    Encouraged heart healthy diet, increase exercise, avoid trans fats, consider a krill oil cap daily      EDEMA    Compression hose, elevate feet. Minimize sodium and lasix prn      Hyperglycemia    hgba1c acceptable, minimize simple carbs. Increase exercise as tolerated.       Essential hypertension    Well controlled, no changes to meds. Encouraged heart healthy diet such as the DASH diet and exercise as tolerated.       Bradycardia    He is rate controlled with a pacer      Renal insufficiency    Hydrate and monitor       Other Visit Diagnoses    SOB (shortness of breath)    -  Primary   Relevant Orders   DG Chest 2 View (Completed)      I am having [place] [facility] maintain his cholecalciferol, sennosides-docusate sodium, multivitamin with minerals, acetaminophen, CoQ10, polyethylene glycol, aspirin, Misc Natural Products (TART CHERRY ADVANCED PO), atorvastatin, gabapentin, Ferrous Fumarate-Folic Acid, furosemide, ELDERBERRY PO, vitamin C,  and apixaban.  No orders of the defined types were placed in this encounter.    Penni Homans, MD

## 2018-10-09 NOTE — Assessment & Plan Note (Signed)
Well controlled, no changes to meds. Encouraged heart healthy diet such as the DASH diet and exercise as tolerated.  °

## 2018-10-09 NOTE — Assessment & Plan Note (Signed)
Compression hose, elevate feet. Minimize sodium and lasix prn

## 2018-10-09 NOTE — Assessment & Plan Note (Signed)
Encouraged heart healthy diet, increase exercise, avoid trans fats, consider a krill oil cap daily 

## 2018-10-09 NOTE — Assessment & Plan Note (Signed)
He is rate controlled with a pacer

## 2018-10-09 NOTE — Assessment & Plan Note (Signed)
hgba1c acceptable, minimize simple carbs. Increase exercise as tolerated.  

## 2018-10-16 ENCOUNTER — Ambulatory Visit (INDEPENDENT_AMBULATORY_CARE_PROVIDER_SITE_OTHER): Payer: PPO | Admitting: Physician Assistant

## 2018-10-16 ENCOUNTER — Telehealth: Payer: Self-pay | Admitting: Family Medicine

## 2018-10-16 ENCOUNTER — Encounter (INDEPENDENT_AMBULATORY_CARE_PROVIDER_SITE_OTHER): Payer: Self-pay

## 2018-10-16 DIAGNOSIS — R5381 Other malaise: Secondary | ICD-10-CM

## 2018-10-16 DIAGNOSIS — Z9181 History of falling: Secondary | ICD-10-CM

## 2018-10-16 DIAGNOSIS — I519 Heart disease, unspecified: Secondary | ICD-10-CM

## 2018-10-16 NOTE — Telephone Encounter (Signed)
Copied from [place]. Topic: Quick Communication - See Telephone Encounter >> Oct 16, 2018  8:34 AM Loma Boston wrote: CRM for notification. See Telephone encounter for: 10/16/18. Dr Charlett Blake was supposed to do an order for a wheelchair on 1/17, now wheelchair is not working at all, he is sick and has no way even to get to the doctor till he gets a wheelchair. Please help asap. Call [phone]. Dr. Randel Pigg nurse please

## 2018-10-16 NOTE — Telephone Encounter (Signed)
Spoke with patient letting him know I have sent in orders for a new wheel chair to [facility].

## 2018-10-17 ENCOUNTER — Encounter (HOSPITAL_COMMUNITY): Payer: Self-pay | Admitting: Nurse Practitioner

## 2018-10-17 ENCOUNTER — Ambulatory Visit (HOSPITAL_COMMUNITY)
Admission: RE | Admit: 2018-10-17 | Discharge: 2018-10-17 | Disposition: A | Discharge status: Home / Self Care | Payer: PPO | Source: Ambulatory Visit | Attending: Nurse Practitioner | Admitting: Nurse Practitioner

## 2018-10-17 DIAGNOSIS — D649 Anemia, unspecified: Secondary | ICD-10-CM | POA: Diagnosis not present

## 2018-10-17 DIAGNOSIS — Z950 Presence of cardiac pacemaker: Secondary | ICD-10-CM | POA: Diagnosis not present

## 2018-10-17 DIAGNOSIS — Z888 Allergy status to other drugs, medicaments and biological substances status: Secondary | ICD-10-CM | POA: Insufficient documentation

## 2018-10-17 DIAGNOSIS — I10 Essential (primary) hypertension: Secondary | ICD-10-CM | POA: Diagnosis not present

## 2018-10-17 DIAGNOSIS — Z953 Presence of xenogenic heart valve: Secondary | ICD-10-CM | POA: Insufficient documentation

## 2018-10-17 DIAGNOSIS — Z6841 Body Mass Index (BMI) 40.0 and over, adult: Secondary | ICD-10-CM | POA: Diagnosis not present

## 2018-10-17 DIAGNOSIS — M109 Gout, unspecified: Secondary | ICD-10-CM | POA: Insufficient documentation

## 2018-10-17 DIAGNOSIS — G4733 Obstructive sleep apnea (adult) (pediatric): Secondary | ICD-10-CM | POA: Insufficient documentation

## 2018-10-17 DIAGNOSIS — Z87891 Personal history of nicotine dependence: Secondary | ICD-10-CM | POA: Insufficient documentation

## 2018-10-17 DIAGNOSIS — I4892 Unspecified atrial flutter: Secondary | ICD-10-CM | POA: Diagnosis not present

## 2018-10-17 DIAGNOSIS — Z79899 Other long term (current) drug therapy: Secondary | ICD-10-CM | POA: Insufficient documentation

## 2018-10-17 DIAGNOSIS — E785 Hyperlipidemia, unspecified: Secondary | ICD-10-CM | POA: Insufficient documentation

## 2018-10-17 DIAGNOSIS — Z7901 Long term (current) use of anticoagulants: Secondary | ICD-10-CM | POA: Insufficient documentation

## 2018-10-17 DIAGNOSIS — I4819 Other persistent atrial fibrillation: Secondary | ICD-10-CM | POA: Diagnosis not present

## 2018-10-17 DIAGNOSIS — I4891 Unspecified atrial fibrillation: Secondary | ICD-10-CM | POA: Diagnosis present

## 2018-10-17 DIAGNOSIS — Z7982 Long term (current) use of aspirin: Secondary | ICD-10-CM | POA: Diagnosis not present

## 2018-10-17 DIAGNOSIS — G629 Polyneuropathy, unspecified: Secondary | ICD-10-CM | POA: Insufficient documentation

## 2018-10-17 DIAGNOSIS — M1990 Unspecified osteoarthritis, unspecified site: Secondary | ICD-10-CM | POA: Diagnosis not present

## 2018-10-17 LAB — BASIC METABOLIC PANEL
Anion gap: 11 (ref 5–15)
BUN: 34 mg/dL — ABNORMAL HIGH (ref 8–23)
CO2: 29 mmol/L (ref 22–32)
Calcium: 9.1 mg/dL (ref 8.9–10.3)
Chloride: 100 mmol/L (ref 98–111)
Creatinine, Ser: 1.09 mg/dL (ref 0.61–1.24)
GFR calc Af Amer: >60 mL/min (ref >60)
GFR calc non Af Amer: >60 mL/min (ref >60)
Glucose, Bld: 100 mg/dL — ABNORMAL HIGH (ref 70–99)
Potassium: 3.9 mmol/L (ref 3.5–5.1)
Sodium: 140 mmol/L (ref 135–145)

## 2018-10-17 LAB — CBC
HCT: 37.6 % — ABNORMAL LOW (ref 39.0–52.0)
Hemoglobin: 11.1 g/dL — ABNORMAL LOW (ref 13.0–17.0)
MCH: 28.8 pg (ref 26.0–34.0)
MCHC: 29.5 g/dL — ABNORMAL LOW (ref 30.0–36.0)
MCV: 97.4 fL (ref 80.0–100.0)
Platelets: 156 10*3/uL (ref 150–400)
RBC: 3.86 MIL/uL — ABNORMAL LOW (ref 4.22–5.81)
RDW: 17.9 % — ABNORMAL HIGH (ref 11.5–15.5)
WBC: 7.5 10*3/uL (ref 4.0–10.5)
nRBC: 0 % (ref 0.0–0.2)

## 2018-10-17 NOTE — Progress Notes (Addendum)
Primary Care Physician: [name], MD Referring Physician: Device clinic Cardiologist: Dr. [name] is a 78 y.o. male with a h/o morbid obesity, severe aortic stenosis, s/p TAVR in 2016, PPM for AV block, 04/27/18. She was referred to the afib clinic after device clinic noted afib x 2 days. He has a CHA2DS2VASc score of at least 2 . He has been on asa and Plavix since TAVR and states that he bleeds easily. He often will see blood on the toilet paper or in the toilet after a bowel movement. He denies any awareness of afib, no alcohol, some caffeine. Wears cpap.   F/u Afib clinic 10/17/18. Patient reports that overall he is doing well. He did visit the ER with an episode of toe bleeding after stepping on a nail. ECG today appears to be V-paced with underlying atrial flutter. He has not noted any palpations or heart racing but has had intermittent shortness of breath.  Today, he denies symptoms of palpitations, chest pain, orthopnea, PND, lower extremity edema, dizziness, presyncope, syncope, or neurologic sequela. The patient is tolerating medications without difficulties and is otherwise without complaint today.   Past Medical History:  Diagnosis Date  . Anemia   . Aortic stenosis   . Aortic stenosis, severe    S/p Edwards Sapien 3 Transcatheter Heart Valve (size 26 mm, model # [id number], serial # [id number])  . Arthritis   . Back pain   . Bursitis   . Cataract    left immature  . Cellulitis 10/12/2015  . Complication of anesthesia    Halucinations  . Constipation    takes Miralax daily as well as Senokot daily  . DDD (degenerative disc disease)   . Gout    takes Allopurinol daily  . Heart valve disorder   . History of blood clots 1962   knee  . History of blood transfusion    no abnormal reaction noted  . History of shingles   . Hyperlipidemia    takes Atorvastatin daily  . Hypertension    takes Lisinopril daily  . Joint pain   . Low back pain 01/25/2017  .  Neck pain on left side 01/25/2017  . OSA (obstructive sleep apnea)   . Osteoarthritis   . Peripheral edema    takes Furosemide daily  . Peripheral neuropathy    takes Gabapentin daily  . Peroneal palsy    significant right foot drop  . Pneumonia 25+yrs ago   hx of  . Shortness of breath   . Swelling of extremity   . Urinary frequency   . Urinary urgency   . Valvular heart disease    Past Surgical History:  Procedure Laterality Date  . CARDIAC CATHETERIZATION N/A 05/02/2015   Procedure: Right/Left Heart Cath and Coronary Angiography;  Surgeon: Burnell Blanks, MD;  Location: [place] [facility];  Service: Cardiovascular;  Laterality: N/A;  . COLONOSCOPY N/A 02/07/2013   Procedure: COLONOSCOPY;  Surgeon: Juanita Craver, MD;  Location: [facility] ENDOSCOPY;  Service: Endoscopy;  Laterality: N/A;  . COLONOSCOPY N/A 02/09/2013   Procedure: COLONOSCOPY;  Surgeon: Beryle Beams, MD;  Location: [place];  Service: Endoscopy;  Laterality: N/A;  . ESOPHAGOGASTRODUODENOSCOPY N/A 02/07/2013   Procedure: ESOPHAGOGASTRODUODENOSCOPY (EGD);  Surgeon: Juanita Craver, MD;  Location: [facility] ENDOSCOPY;  Service: Endoscopy;  Laterality: N/A;  . GIVENS CAPSULE STUDY N/A 02/09/2013   Procedure: GIVENS CAPSULE STUDY;  Surgeon: Beryle Beams, MD;  Location: [place];  Service: Endoscopy;  Laterality: N/A;  . HERNIA REPAIR     umbilical hernia  . KNEE SURGERY Right    multiple knee surgeries due to complication of R TKA  . LAMINOTOMY  3295   c6-t2  . PACEMAKER IMPLANT N/A 04/27/2018   Procedure: PACEMAKER IMPLANT;  Surgeon: Constance Haw, MD;  Location: [place] [facility];  Service: Cardiovascular;  Laterality: N/A;  . SPINE SURGERY    . TEE WITHOUT CARDIOVERSION N/A 03/07/2015   Procedure: TRANSESOPHAGEAL ECHOCARDIOGRAM (TEE);  Surgeon: Josue Hector, MD;  Location: [facility] ENDOSCOPY;  Service: Cardiovascular;  Laterality: N/A;  ANES TO BRING PROPOFOL PER DOCTOR  . TEE WITHOUT CARDIOVERSION N/A 06/10/2015    Procedure: TRANSESOPHAGEAL ECHOCARDIOGRAM (TEE);  Surgeon: Burnell Blanks, MD;  Location: [place];  Service: Open Heart Surgery;  Laterality: N/A;  . TOTAL KNEE ARTHROPLASTY     right  . TRANSCATHETER AORTIC VALVE REPLACEMENT, TRANSFEMORAL Left 06/10/2015   Procedure: TRANSCATHETER AORTIC VALVE REPLACEMENT, TRANSFEMORAL;  Surgeon: Burnell Blanks, MD;  Location: [place];  Service: Open Heart Surgery;  Laterality: Left;    Current Outpatient Medications  Medication Sig Dispense Refill  . acetaminophen (TYLENOL) 500 MG tablet Take 1,000 mg by mouth 2 (two) times daily. Every day use    . apixaban (ELIQUIS) 5 MG TABS tablet Take 1 tablet (5 mg total) by mouth 2 (two) times daily. 60 tablet 0  . aspirin 81 MG EC tablet TAKE 1 TABLET (81 MG TOTAL) BY MOUTH DAILY. 30 tablet 11  . atorvastatin (LIPITOR) 40 MG tablet Take 1 and a half tablet daily by mouth  (60mg ) 135 tablet 5  . cholecalciferol (VITAMIN D) 1000 UNITS tablet Take 1,000 Units by mouth daily.     . Coenzyme Q10 (COQ10) 200 MG CAPS Take 200 mg by mouth daily.    [name] ELDERBERRY PO Take by mouth.    . Ferrous Fumarate-Folic Acid (HEMATINIC/FOLIC ACID) 188-4 MG TABS Take 1 tablet by mouth 2 (two) times a week. 90 each 1  . furosemide (LASIX) 20 MG tablet Take 1 tablet (20 mg total) by mouth 2 (two) times daily. 180 tablet 2  . gabapentin (NEURONTIN) 300 MG capsule Take 1 capsule (300 mg total) by mouth 3 (three) times daily. 270 capsule 2  . Misc Natural Products (TART CHERRY ADVANCED PO) Take 1 capsule by mouth daily.    . Multiple Vitamin (MULTIVITAMIN WITH MINERALS) TABS Take 1 tablet by mouth daily.    . polyethylene glycol (MIRALAX / GLYCOLAX) packet Take 17 g by mouth daily.    . sennosides-docusate sodium (SENOKOT-S) 8.6-50 MG tablet Take 1 tablet by mouth 2 (two) times daily.    . vitamin C (ASCORBIC ACID) 500 MG tablet Take 500 mg by mouth daily.     No current facility-administered medications for this encounter.      Allergies  Allergen Reactions  . Coumadin [Warfarin Sodium] Other (See Comments)    States he can't be on this-bleeds out    Social History   Socioeconomic History  . Marital status: Married    Spouse name: [name]  . Number of children: 3  . Years of education: Not on file  . Highest education level: Not on file  Occupational History  . Occupation: retired Investment banker, operational  . Financial resource strain: Not on file  . Food insecurity:    Worry: Not on file    Inability: Not on file  . Transportation needs:    Medical: Not on  file    Non-medical: Not on file  Tobacco Use  . Smoking status: Former Smoker    Years: 30.00    Types: Pipe    Last attempt to quit: 09/20/2006    Years since quitting: 12.0  . Smokeless tobacco: Never Used  Substance and Sexual Activity  . Alcohol use: No    Alcohol/week: 0.0 standard drinks  . Drug use: No  . Sexual activity: Never    Comment: lives with wife, retired, no dietary restrictions  Lifestyle  . Physical activity:    Days per week: Not on file    Minutes per session: Not on file  . Stress: Not on file  Relationships  . Social connections:    Talks on phone: Not on file    Gets together: Not on file    Attends religious service: Not on file    Active member of club or organization: Not on file    Attends meetings of clubs or organizations: Not on file    Relationship status: Not on file  . Intimate partner violence:    Fear of current or ex partner: Not on file    Emotionally abused: Not on file    Physically abused: Not on file    Forced sexual activity: Not on file  Other Topics Concern  . Not on file  Social History Narrative  . Not on file    Family History  Problem Relation Age of Onset  . Other Father        POSSIBLE HEART ATTACK  . Arthritis Sister        "crippling"  . Arthritis Sister     ROS- All systems are reviewed and negative except as per the HPI above  Physical Exam: Vitals:    10/17/18 1145  BP: 134/76  Pulse: 70  Height: 6\' 2"  (1.88 m)   Wt Readings from Last 3 Encounters:  10/06/18 (!) 169.6 kg  10/02/18 (!) 169.6 kg  09/25/18 (!) 157.9 kg    Labs: Lab Results  Component Value Date   NA 140 06/29/2018   K 4.6 06/29/2018   CL 98 06/29/2018   CO2 38 (H) 06/29/2018   GLUCOSE 91 06/29/2018   BUN 32 (H) 06/29/2018   CREATININE 1.07 06/29/2018   CALCIUM 9.4 06/29/2018   PHOS 4.0 02/21/2015   MG 2.2 06/11/2015   Lab Results  Component Value Date   INR 1.32 06/10/2015   Lab Results  Component Value Date   CHOL 140 08/22/2018   HDL 30 (L) 08/22/2018   LDLCALC 85 08/22/2018   TRIG 126 08/22/2018     GEN- The patient is well appearing, obese male, alert and oriented x 3 today.   HEENT-head normocephalic, atraumatic, sclera clear, conjunctiva pink, hearing intact, trachea midline. Lungs- Clear to ausculation bilaterally, normal work of breathing Heart- Regular rate and rhythm, no murmurs, rubs or gallops  GI- soft, NT, ND, + BS Extremities- no clubbing, cyanosis, or edema MS- no significant deformity or atrophy Skin- no rash or lesion, chronic stasis Psych- euthymic mood, full affect Neuro- strength and sensation are intact   EKG- v paced at 70 bpm, underlying atrial flutter.  Epic notes reviewed.   Assessment and Plan: 1. Persistent Afib/flutter Device interrogation today shows persistent afib since the end of December. ECG shows V-paced HR 70 with underlying atrial flutter. He does note intermittent SOB but overall has been asymptomatic. Continue Eliquis 5 mg BID Will discuss possible DCCV with Dr Curt Bears Check Bmet/CBC  today  This patients CHA2DS2-VASc Score and unadjusted Ischemic Stroke Rate (% per year) is equal to 2.2 % stroke rate/year from a score of 2  Above score calculated as 1 point each if present [CHF, HTN, DM, Vascular=MI/PAD/Aortic Plaque, Age if 65-74, or Male] Above score calculated as 2 points each if present  [Age > 75, or Stroke/TIA/TE]   2. HTN Stable, no changes today.  3. PPM Per device clinic and Dr Curt Bears  4. OSA Compliant with CPAP therapy.  5. Obesity Body mass index is 48.02 kg/m. Lifestyle modifications encouraged.   Will call patient for follow up and possible cardioversion.  Addendum: Will proceed with DCCV per Dr Curt Bears. Follow up 1 week post procedure.  [facility] [address] [place], [place] [address] [phone]

## 2018-10-17 NOTE — H&P (View-Only) (Signed)
Primary Care Physician: [name], MD Referring Physician: Device clinic Cardiologist: Dr. [name] is a 78 y.o. male with a h/o morbid obesity, severe aortic stenosis, s/p TAVR in 2016, PPM for AV block, 04/27/18. She was referred to the afib clinic after device clinic noted afib x 2 days. He has a CHA2DS2VASc score of at least 2 . He has been on asa and Plavix since TAVR and states that he bleeds easily. He often will see blood on the toilet paper or in the toilet after a bowel movement. He denies any awareness of afib, no alcohol, some caffeine. Wears cpap.   F/u Afib clinic 10/17/18. Patient reports that overall he is doing well. He did visit the ER with an episode of toe bleeding after stepping on a nail. ECG today appears to be V-paced with underlying atrial flutter. He has not noted any palpations or heart racing but has had intermittent shortness of breath.  Today, he denies symptoms of palpitations, chest pain, orthopnea, PND, lower extremity edema, dizziness, presyncope, syncope, or neurologic sequela. The patient is tolerating medications without difficulties and is otherwise without complaint today.   Past Medical History:  Diagnosis Date  . Anemia   . Aortic stenosis   . Aortic stenosis, severe    S/p Edwards Sapien 3 Transcatheter Heart Valve (size 26 mm, model # [id number], serial # [id number])  . Arthritis   . Back pain   . Bursitis   . Cataract    left immature  . Cellulitis 10/12/2015  . Complication of anesthesia    Halucinations  . Constipation    takes Miralax daily as well as Senokot daily  . DDD (degenerative disc disease)   . Gout    takes Allopurinol daily  . Heart valve disorder   . History of blood clots 1962   knee  . History of blood transfusion    no abnormal reaction noted  . History of shingles   . Hyperlipidemia    takes Atorvastatin daily  . Hypertension    takes Lisinopril daily  . Joint pain   . Low back pain 01/25/2017  .  Neck pain on left side 01/25/2017  . OSA (obstructive sleep apnea)   . Osteoarthritis   . Peripheral edema    takes Furosemide daily  . Peripheral neuropathy    takes Gabapentin daily  . Peroneal palsy    significant right foot drop  . Pneumonia 25+yrs ago   hx of  . Shortness of breath   . Swelling of extremity   . Urinary frequency   . Urinary urgency   . Valvular heart disease    Past Surgical History:  Procedure Laterality Date  . CARDIAC CATHETERIZATION N/A 05/02/2015   Procedure: Right/Left Heart Cath and Coronary Angiography;  Surgeon: Burnell Blanks, MD;  Location: [place] [facility];  Service: Cardiovascular;  Laterality: N/A;  . COLONOSCOPY N/A 02/07/2013   Procedure: COLONOSCOPY;  Surgeon: Juanita Craver, MD;  Location: [facility] ENDOSCOPY;  Service: Endoscopy;  Laterality: N/A;  . COLONOSCOPY N/A 02/09/2013   Procedure: COLONOSCOPY;  Surgeon: Beryle Beams, MD;  Location: [place];  Service: Endoscopy;  Laterality: N/A;  . ESOPHAGOGASTRODUODENOSCOPY N/A 02/07/2013   Procedure: ESOPHAGOGASTRODUODENOSCOPY (EGD);  Surgeon: Juanita Craver, MD;  Location: [facility] ENDOSCOPY;  Service: Endoscopy;  Laterality: N/A;  . GIVENS CAPSULE STUDY N/A 02/09/2013   Procedure: GIVENS CAPSULE STUDY;  Surgeon: Beryle Beams, MD;  Location: [place];  Service: Endoscopy;  Laterality: N/A;  . HERNIA REPAIR     umbilical hernia  . KNEE SURGERY Right    multiple knee surgeries due to complication of R TKA  . LAMINOTOMY  7408   c6-t2  . PACEMAKER IMPLANT N/A 04/27/2018   Procedure: PACEMAKER IMPLANT;  Surgeon: Constance Haw, MD;  Location: [place] [facility];  Service: Cardiovascular;  Laterality: N/A;  . SPINE SURGERY    . TEE WITHOUT CARDIOVERSION N/A 03/07/2015   Procedure: TRANSESOPHAGEAL ECHOCARDIOGRAM (TEE);  Surgeon: Josue Hector, MD;  Location: [facility] ENDOSCOPY;  Service: Cardiovascular;  Laterality: N/A;  ANES TO BRING PROPOFOL PER DOCTOR  . TEE WITHOUT CARDIOVERSION N/A 06/10/2015    Procedure: TRANSESOPHAGEAL ECHOCARDIOGRAM (TEE);  Surgeon: Burnell Blanks, MD;  Location: [place];  Service: Open Heart Surgery;  Laterality: N/A;  . TOTAL KNEE ARTHROPLASTY     right  . TRANSCATHETER AORTIC VALVE REPLACEMENT, TRANSFEMORAL Left 06/10/2015   Procedure: TRANSCATHETER AORTIC VALVE REPLACEMENT, TRANSFEMORAL;  Surgeon: Burnell Blanks, MD;  Location: [facility];  Service: Open Heart Surgery;  Laterality: Left;    Current Outpatient Medications  Medication Sig Dispense Refill  . acetaminophen (TYLENOL) 500 MG tablet Take 1,000 mg by mouth 2 (two) times daily. Every day use    . apixaban (ELIQUIS) 5 MG TABS tablet Take 1 tablet (5 mg total) by mouth 2 (two) times daily. 60 tablet 0  . aspirin 81 MG EC tablet TAKE 1 TABLET (81 MG TOTAL) BY MOUTH DAILY. 30 tablet 11  . atorvastatin (LIPITOR) 40 MG tablet Take 1 and a half tablet daily by mouth  (60mg ) 135 tablet 5  . cholecalciferol (VITAMIN D) 1000 UNITS tablet Take 1,000 Units by mouth daily.     . Coenzyme Q10 (COQ10) 200 MG CAPS Take 200 mg by mouth daily.    [name] ELDERBERRY PO Take by mouth.    . Ferrous Fumarate-Folic Acid (HEMATINIC/FOLIC ACID) 144-8 MG TABS Take 1 tablet by mouth 2 (two) times a week. 90 each 1  . furosemide (LASIX) 20 MG tablet Take 1 tablet (20 mg total) by mouth 2 (two) times daily. 180 tablet 2  . gabapentin (NEURONTIN) 300 MG capsule Take 1 capsule (300 mg total) by mouth 3 (three) times daily. 270 capsule 2  . Misc Natural Products (TART CHERRY ADVANCED PO) Take 1 capsule by mouth daily.    . Multiple Vitamin (MULTIVITAMIN WITH MINERALS) TABS Take 1 tablet by mouth daily.    . polyethylene glycol (MIRALAX / GLYCOLAX) packet Take 17 g by mouth daily.    . sennosides-docusate sodium (SENOKOT-S) 8.6-50 MG tablet Take 1 tablet by mouth 2 (two) times daily.    . vitamin C (ASCORBIC ACID) 500 MG tablet Take 500 mg by mouth daily.     No current facility-administered medications for this encounter.      Allergies  Allergen Reactions  . Coumadin [Warfarin Sodium] Other (See Comments)    States he can't be on this-bleeds out    Social History   Socioeconomic History  . Marital status: Married    Spouse name: [name]  . Number of children: 3  . Years of education: Not on file  . Highest education level: Not on file  Occupational History  . Occupation: retired Investment banker, operational  . Financial resource strain: Not on file  . Food insecurity:    Worry: Not on file    Inability: Not on file  . Transportation needs:    Medical: Not on  file    Non-medical: Not on file  Tobacco Use  . Smoking status: Former Smoker    Years: 30.00    Types: Pipe    Last attempt to quit: 09/20/2006    Years since quitting: 12.0  . Smokeless tobacco: Never Used  Substance and Sexual Activity  . Alcohol use: No    Alcohol/week: 0.0 standard drinks  . Drug use: No  . Sexual activity: Never    Comment: lives with wife, retired, no dietary restrictions  Lifestyle  . Physical activity:    Days per week: Not on file    Minutes per session: Not on file  . Stress: Not on file  Relationships  . Social connections:    Talks on phone: Not on file    Gets together: Not on file    Attends religious service: Not on file    Active member of club or organization: Not on file    Attends meetings of clubs or organizations: Not on file    Relationship status: Not on file  . Intimate partner violence:    Fear of current or ex partner: Not on file    Emotionally abused: Not on file    Physically abused: Not on file    Forced sexual activity: Not on file  Other Topics Concern  . Not on file  Social History Narrative  . Not on file    Family History  Problem Relation Age of Onset  . Other Father        POSSIBLE HEART ATTACK  . Arthritis Sister        "crippling"  . Arthritis Sister     ROS- All systems are reviewed and negative except as per the HPI above  Physical Exam: Vitals:    10/17/18 1145  BP: 134/76  Pulse: 70  Height: 6\' 2"  (1.88 m)   Wt Readings from Last 3 Encounters:  10/06/18 (!) 169.6 kg  10/02/18 (!) 169.6 kg  09/25/18 (!) 157.9 kg    Labs: Lab Results  Component Value Date   NA 140 06/29/2018   K 4.6 06/29/2018   CL 98 06/29/2018   CO2 38 (H) 06/29/2018   GLUCOSE 91 06/29/2018   BUN 32 (H) 06/29/2018   CREATININE 1.07 06/29/2018   CALCIUM 9.4 06/29/2018   PHOS 4.0 02/21/2015   MG 2.2 06/11/2015   Lab Results  Component Value Date   INR 1.32 06/10/2015   Lab Results  Component Value Date   CHOL 140 08/22/2018   HDL 30 (L) 08/22/2018   LDLCALC 85 08/22/2018   TRIG 126 08/22/2018     GEN- The patient is well appearing, obese male, alert and oriented x 3 today.   HEENT-head normocephalic, atraumatic, sclera clear, conjunctiva pink, hearing intact, trachea midline. Lungs- Clear to ausculation bilaterally, normal work of breathing Heart- Regular rate and rhythm, no murmurs, rubs or gallops  GI- soft, NT, ND, + BS Extremities- no clubbing, cyanosis, or edema MS- no significant deformity or atrophy Skin- no rash or lesion, chronic stasis Psych- euthymic mood, full affect Neuro- strength and sensation are intact   EKG- v paced at 70 bpm, underlying atrial flutter.  Epic notes reviewed.   Assessment and Plan: 1. Persistent Afib/flutter Device interrogation today shows persistent afib since the end of December. ECG shows V-paced HR 70 with underlying atrial flutter. He does note intermittent SOB but overall has been asymptomatic. Continue Eliquis 5 mg BID Will discuss possible DCCV with Dr Curt Bears Check Bmet/CBC  today  This patients CHA2DS2-VASc Score and unadjusted Ischemic Stroke Rate (% per year) is equal to 2.2 % stroke rate/year from a score of 2  Above score calculated as 1 point each if present [CHF, HTN, DM, Vascular=MI/PAD/Aortic Plaque, Age if 65-74, or Male] Above score calculated as 2 points each if present  [Age > 75, or Stroke/TIA/TE]   2. HTN Stable, no changes today.  3. PPM Per device clinic and Dr Curt Bears  4. OSA Compliant with CPAP therapy.  5. Obesity Body mass index is 48.02 kg/m. Lifestyle modifications encouraged.   Will call patient for follow up and possible cardioversion.  Addendum: Will proceed with DCCV per Dr Curt Bears. Follow up 1 week post procedure.  [facility] [address] [place], [place] [address] [phone]

## 2018-10-18 ENCOUNTER — Other Ambulatory Visit (HOSPITAL_COMMUNITY): Payer: Self-pay | Admitting: *Deleted

## 2018-10-18 ENCOUNTER — Other Ambulatory Visit (HOSPITAL_COMMUNITY): Payer: Self-pay | Admitting: Nurse Practitioner

## 2018-10-18 ENCOUNTER — Telehealth (HOSPITAL_COMMUNITY): Payer: Self-pay | Admitting: *Deleted

## 2018-10-18 DIAGNOSIS — G4733 Obstructive sleep apnea (adult) (pediatric): Secondary | ICD-10-CM | POA: Diagnosis not present

## 2018-10-18 DIAGNOSIS — R0602 Shortness of breath: Secondary | ICD-10-CM | POA: Diagnosis not present

## 2018-10-18 NOTE — Telephone Encounter (Signed)
Patient set up for cardioversion 10/27/18 @ 1130am. Instructed NPO after MN morning meds with sip of water. Confirms no missed doses of eliquis. Has follow up appt scheduled. Pt verbalized understanding of all instructions.

## 2018-10-19 NOTE — Telephone Encounter (Signed)
Called patient to follow up about wheel chair. Patient stated [facility] gave him a call on Tuesday and will be out to his house on Monday to evaluate for wheelchair.

## 2018-10-22 DIAGNOSIS — J9622 Acute and chronic respiratory failure with hypercapnia: Secondary | ICD-10-CM | POA: Diagnosis not present

## 2018-10-22 DIAGNOSIS — J449 Chronic obstructive pulmonary disease, unspecified: Secondary | ICD-10-CM | POA: Diagnosis not present

## 2018-10-22 DIAGNOSIS — G4733 Obstructive sleep apnea (adult) (pediatric): Secondary | ICD-10-CM | POA: Diagnosis not present

## 2018-10-22 DIAGNOSIS — R0602 Shortness of breath: Secondary | ICD-10-CM | POA: Diagnosis not present

## 2018-10-26 ENCOUNTER — Other Ambulatory Visit: Payer: Self-pay | Admitting: Cardiology

## 2018-10-27 ENCOUNTER — Encounter (HOSPITAL_COMMUNITY)
Admission: AD | Disposition: A | Discharge status: Home Health | Payer: Self-pay | Source: Home / Self Care | Attending: Internal Medicine

## 2018-10-27 ENCOUNTER — Ambulatory Visit (HOSPITAL_COMMUNITY): Payer: PPO

## 2018-10-27 ENCOUNTER — Ambulatory Visit (HOSPITAL_COMMUNITY): Payer: PPO | Admitting: Certified Registered Nurse Anesthetist

## 2018-10-27 ENCOUNTER — Other Ambulatory Visit: Payer: Self-pay

## 2018-10-27 ENCOUNTER — Encounter (HOSPITAL_COMMUNITY): Payer: Self-pay | Admitting: *Deleted

## 2018-10-27 ENCOUNTER — Inpatient Hospital Stay (HOSPITAL_COMMUNITY)
Admission: AD | Admit: 2018-10-27 | Discharge: 2018-11-04 | DRG: 291 | Disposition: A | Discharge status: Home Health | Payer: PPO | Attending: Internal Medicine | Admitting: Internal Medicine

## 2018-10-27 DIAGNOSIS — Z79899 Other long term (current) drug therapy: Secondary | ICD-10-CM | POA: Diagnosis not present

## 2018-10-27 DIAGNOSIS — I5033 Acute on chronic diastolic (congestive) heart failure: Secondary | ICD-10-CM | POA: Diagnosis not present

## 2018-10-27 DIAGNOSIS — Z952 Presence of prosthetic heart valve: Secondary | ICD-10-CM

## 2018-10-27 DIAGNOSIS — Z8619 Personal history of other infectious and parasitic diseases: Secondary | ICD-10-CM | POA: Diagnosis not present

## 2018-10-27 DIAGNOSIS — E785 Hyperlipidemia, unspecified: Secondary | ICD-10-CM | POA: Diagnosis present

## 2018-10-27 DIAGNOSIS — R0600 Dyspnea, unspecified: Secondary | ICD-10-CM

## 2018-10-27 DIAGNOSIS — Z86718 Personal history of other venous thrombosis and embolism: Secondary | ICD-10-CM

## 2018-10-27 DIAGNOSIS — G4733 Obstructive sleep apnea (adult) (pediatric): Secondary | ICD-10-CM | POA: Diagnosis not present

## 2018-10-27 DIAGNOSIS — Z7901 Long term (current) use of anticoagulants: Secondary | ICD-10-CM | POA: Diagnosis not present

## 2018-10-27 DIAGNOSIS — J069 Acute upper respiratory infection, unspecified: Secondary | ICD-10-CM | POA: Diagnosis present

## 2018-10-27 DIAGNOSIS — R0602 Shortness of breath: Secondary | ICD-10-CM | POA: Diagnosis not present

## 2018-10-27 DIAGNOSIS — J449 Chronic obstructive pulmonary disease, unspecified: Secondary | ICD-10-CM | POA: Diagnosis not present

## 2018-10-27 DIAGNOSIS — J9601 Acute respiratory failure with hypoxia: Secondary | ICD-10-CM | POA: Diagnosis not present

## 2018-10-27 DIAGNOSIS — I110 Hypertensive heart disease with heart failure: Principal | ICD-10-CM | POA: Diagnosis present

## 2018-10-27 DIAGNOSIS — Z96651 Presence of right artificial knee joint: Secondary | ICD-10-CM | POA: Diagnosis present

## 2018-10-27 DIAGNOSIS — D649 Anemia, unspecified: Secondary | ICD-10-CM | POA: Diagnosis present

## 2018-10-27 DIAGNOSIS — Z87891 Personal history of nicotine dependence: Secondary | ICD-10-CM

## 2018-10-27 DIAGNOSIS — Z950 Presence of cardiac pacemaker: Secondary | ICD-10-CM | POA: Diagnosis not present

## 2018-10-27 DIAGNOSIS — Z888 Allergy status to other drugs, medicaments and biological substances status: Secondary | ICD-10-CM

## 2018-10-27 DIAGNOSIS — Z9981 Dependence on supplemental oxygen: Secondary | ICD-10-CM

## 2018-10-27 DIAGNOSIS — Z8261 Family history of arthritis: Secondary | ICD-10-CM

## 2018-10-27 DIAGNOSIS — I4819 Other persistent atrial fibrillation: Secondary | ICD-10-CM | POA: Diagnosis not present

## 2018-10-27 DIAGNOSIS — I4891 Unspecified atrial fibrillation: Secondary | ICD-10-CM

## 2018-10-27 DIAGNOSIS — I872 Venous insufficiency (chronic) (peripheral): Secondary | ICD-10-CM | POA: Diagnosis not present

## 2018-10-27 DIAGNOSIS — R0902 Hypoxemia: Secondary | ICD-10-CM | POA: Diagnosis present

## 2018-10-27 DIAGNOSIS — Z6841 Body Mass Index (BMI) 40.0 and over, adult: Secondary | ICD-10-CM | POA: Diagnosis not present

## 2018-10-27 DIAGNOSIS — I4892 Unspecified atrial flutter: Secondary | ICD-10-CM | POA: Diagnosis not present

## 2018-10-27 DIAGNOSIS — E7800 Pure hypercholesterolemia, unspecified: Secondary | ICD-10-CM | POA: Diagnosis present

## 2018-10-27 DIAGNOSIS — I10 Essential (primary) hypertension: Secondary | ICD-10-CM | POA: Diagnosis not present

## 2018-10-27 DIAGNOSIS — Z9989 Dependence on other enabling machines and devices: Secondary | ICD-10-CM

## 2018-10-27 DIAGNOSIS — Z7982 Long term (current) use of aspirin: Secondary | ICD-10-CM

## 2018-10-27 HISTORY — PX: CARDIOVERSION: SHX1299

## 2018-10-27 LAB — RESPIRATORY PANEL BY PCR

## 2018-10-27 LAB — CBC
HCT: 36.9 % — ABNORMAL LOW (ref 39.0–52.0)
Hemoglobin: 10.8 g/dL — ABNORMAL LOW (ref 13.0–17.0)
MCH: 28.9 pg (ref 26.0–34.0)
MCHC: 29.3 g/dL — ABNORMAL LOW (ref 30.0–36.0)
MCV: 98.7 fL (ref 80.0–100.0)
Platelets: 128 10*3/uL — ABNORMAL LOW (ref 150–400)
RBC: 3.74 MIL/uL — ABNORMAL LOW (ref 4.22–5.81)
RDW: 18.1 % — ABNORMAL HIGH (ref 11.5–15.5)
WBC: 7.2 10*3/uL (ref 4.0–10.5)
nRBC: 0 % (ref 0.0–0.2)

## 2018-10-27 LAB — POCT I-STAT 4, (NA,K, GLUC, HGB,HCT)
Glucose, Bld: 100 mg/dL — ABNORMAL HIGH (ref 70–99)
HCT: 36 % — ABNORMAL LOW (ref 39.0–52.0)
Hemoglobin: 12.2 g/dL — ABNORMAL LOW (ref 13.0–17.0)
Potassium: 4.2 mmol/L (ref 3.5–5.1)
Sodium: 141 mmol/L (ref 135–145)

## 2018-10-27 LAB — BASIC METABOLIC PANEL
Anion gap: 8 (ref 5–15)
BUN: 24 mg/dL — ABNORMAL HIGH (ref 8–23)
CO2: 32 mmol/L (ref 22–32)
Calcium: 9 mg/dL (ref 8.9–10.3)
Chloride: 102 mmol/L (ref 98–111)
Creatinine, Ser: 1.2 mg/dL (ref 0.61–1.24)
GFR calc Af Amer: >60 mL/min (ref >60)
GFR calc non Af Amer: 58 mL/min — ABNORMAL LOW (ref >60)
Glucose, Bld: 137 mg/dL — ABNORMAL HIGH (ref 70–99)
Potassium: 6 mmol/L — ABNORMAL HIGH (ref 3.5–5.1)
Sodium: 142 mmol/L (ref 135–145)

## 2018-10-27 LAB — BRAIN NATRIURETIC PEPTIDE: B Natriuretic Peptide: 125.9 pg/mL — ABNORMAL HIGH (ref 0.0–100.0)

## 2018-10-27 SURGERY — CARDIOVERSION
Anesthesia: General

## 2018-10-27 MED ORDER — SODIUM CHLORIDE 0.9 % IV SOLN
INTRAVENOUS | Status: DC | PRN
  Administered 2018-10-27: 12:00:00 via INTRAVENOUS

## 2018-10-27 MED ORDER — FE FUMARATE-B12-VIT C-FA-IFC PO CAPS
1.0000 | ORAL_CAPSULE | ORAL | Status: DC
  Administered 2018-10-30 – 2018-11-02 (×2): 1 via ORAL
  Filled 2018-10-27 (×2): qty 1

## 2018-10-27 MED ORDER — ACETAMINOPHEN 500 MG PO TABS
1000.0000 mg | ORAL_TABLET | Freq: Two times a day (BID) | ORAL | Status: DC
  Administered 2018-10-27 – 2018-11-04 (×16): 1000 mg via ORAL
  Filled 2018-10-27 (×16): qty 2

## 2018-10-27 MED ORDER — PROPOFOL 10 MG/ML IV BOLUS
INTRAVENOUS | Status: DC | PRN
  Administered 2018-10-27: 10 mg via INTRAVENOUS
  Administered 2018-10-27: 40 mg via INTRAVENOUS

## 2018-10-27 MED ORDER — GABAPENTIN 300 MG PO CAPS
300.0000 mg | ORAL_CAPSULE | Freq: Three times a day (TID) | ORAL | Status: DC
  Administered 2018-10-27 – 2018-11-04 (×23): 300 mg via ORAL
  Filled 2018-10-27 (×23): qty 1

## 2018-10-27 MED ORDER — POLYETHYLENE GLYCOL 3350 17 G PO PACK: 17.0000 g | PACK | Freq: Every day | ORAL | Status: DC | PRN

## 2018-10-27 MED ORDER — ADULT MULTIVITAMIN W/MINERALS CH
1.0000 | ORAL_TABLET | Freq: Every day | ORAL | Status: DC
  Administered 2018-10-28 – 2018-11-04 (×8): 1 via ORAL
  Filled 2018-10-27 (×8): qty 1

## 2018-10-27 MED ORDER — ALBUTEROL SULFATE (2.5 MG/3ML) 0.083% IN NEBU: 2.5000 mg | INHALATION_SOLUTION | RESPIRATORY_TRACT | Status: DC | PRN

## 2018-10-27 MED ORDER — IPRATROPIUM-ALBUTEROL 0.5-2.5 (3) MG/3ML IN SOLN
3.0000 mL | Freq: Four times a day (QID) | RESPIRATORY_TRACT | Status: DC
  Administered 2018-10-27 – 2018-10-28 (×5): 3 mL via RESPIRATORY_TRACT
  Filled 2018-10-27 (×5): qty 3

## 2018-10-27 MED ORDER — COQ10 400 MG PO CAPS: 300.0000 mg | ORAL_CAPSULE | Freq: Every day | ORAL | Status: DC

## 2018-10-27 MED ORDER — VITAMIN C 500 MG PO TABS
500.0000 mg | ORAL_TABLET | Freq: Every day | ORAL | Status: DC
  Administered 2018-10-28 – 2018-11-04 (×8): 500 mg via ORAL
  Filled 2018-10-27 (×8): qty 1

## 2018-10-27 MED ORDER — ATORVASTATIN CALCIUM 40 MG PO TABS
60.0000 mg | ORAL_TABLET | Freq: Every day | ORAL | Status: DC
  Administered 2018-10-28 – 2018-11-04 (×8): 60 mg via ORAL
  Filled 2018-10-27 (×8): qty 2

## 2018-10-27 MED ORDER — ONDANSETRON HCL 4 MG/2ML IJ SOLN: 4.0000 mg | Freq: Four times a day (QID) | INTRAMUSCULAR | Status: DC | PRN

## 2018-10-27 MED ORDER — AMMONIUM LACTATE 12 % EX LOTN
TOPICAL_LOTION | CUTANEOUS | Status: DC | PRN
  Administered 2018-10-31: 10:00:00 via TOPICAL
  Administered 2018-11-01: 1 via TOPICAL
  Filled 2018-10-27: qty 225

## 2018-10-27 MED ORDER — ASPIRIN EC 81 MG PO TBEC
81.0000 mg | DELAYED_RELEASE_TABLET | Freq: Every day | ORAL | Status: DC
  Administered 2018-10-28 – 2018-11-04 (×8): 81 mg via ORAL
  Filled 2018-10-27 (×9): qty 1

## 2018-10-27 MED ORDER — ONDANSETRON HCL 4 MG PO TABS: 4.0000 mg | ORAL_TABLET | Freq: Four times a day (QID) | ORAL | Status: DC | PRN

## 2018-10-27 MED ORDER — FUROSEMIDE 20 MG PO TABS
20.0000 mg | ORAL_TABLET | Freq: Two times a day (BID) | ORAL | Status: DC
  Administered 2018-10-27: 20 mg via ORAL
  Filled 2018-10-27: qty 1

## 2018-10-27 MED ORDER — SODIUM CHLORIDE 0.9% FLUSH
3.0000 mL | Freq: Two times a day (BID) | INTRAVENOUS | Status: DC
  Administered 2018-10-28 – 2018-11-04 (×15): 3 mL via INTRAVENOUS

## 2018-10-27 MED ORDER — POLYETHYLENE GLYCOL 3350 17 G PO PACK
17.0000 g | PACK | Freq: Every day | ORAL | Status: DC
  Administered 2018-10-28 – 2018-11-04 (×8): 17 g via ORAL
  Filled 2018-10-27 (×8): qty 1

## 2018-10-27 MED ORDER — SENNOSIDES-DOCUSATE SODIUM 8.6-50 MG PO TABS
1.0000 | ORAL_TABLET | Freq: Two times a day (BID) | ORAL | Status: DC
  Administered 2018-10-28 – 2018-11-04 (×14): 1 via ORAL
  Filled 2018-10-27 (×15): qty 1

## 2018-10-27 MED ORDER — TRAMADOL HCL 50 MG PO TABS: 100.0000 mg | ORAL_TABLET | Freq: Four times a day (QID) | ORAL | Status: DC | PRN

## 2018-10-27 MED ORDER — VITAMIN D 25 MCG (1000 UNIT) PO TABS
5000.0000 [IU] | ORAL_TABLET | Freq: Every day | ORAL | Status: DC
  Administered 2018-10-28 – 2018-11-04 (×8): 5000 [IU] via ORAL
  Filled 2018-10-27 (×8): qty 5

## 2018-10-27 MED ORDER — LIDOCAINE HCL (CARDIAC) PF 100 MG/5ML IV SOSY
PREFILLED_SYRINGE | INTRAVENOUS | Status: DC | PRN
  Administered 2018-10-27: 80 mg via INTRAVENOUS

## 2018-10-27 MED ORDER — APIXABAN 5 MG PO TABS
5.0000 mg | ORAL_TABLET | Freq: Two times a day (BID) | ORAL | Status: DC
  Administered 2018-10-27 – 2018-11-04 (×16): 5 mg via ORAL
  Filled 2018-10-27 (×16): qty 1

## 2018-10-27 MED ORDER — TRAZODONE HCL 50 MG PO TABS
50.0000 mg | ORAL_TABLET | Freq: Every evening | ORAL | Status: DC | PRN
  Administered 2018-10-27 – 2018-11-03 (×4): 50 mg via ORAL
  Filled 2018-10-27 (×4): qty 1

## 2018-10-27 NOTE — Progress Notes (Signed)
Transported by radiology for 2 view chest xray.

## 2018-10-27 NOTE — Transfer of Care (Signed)
Immediate Anesthesia Transfer of Care Note  Patient: [name]  Procedure(s) Performed: CARDIOVERSION (N/A )  Patient Location: Endoscopy Unit  Anesthesia Type:General  Level of Consciousness: awake, alert , oriented, patient cooperative and responds to stimulation  Airway & Oxygen Therapy: Patient Spontanous Breathing and Patient connected to face mask oxygen  Post-op Assessment: Report given to RN, Post -op Vital signs reviewed and stable and Patient moving all extremities X 4  Post vital signs: Reviewed and stable  Last Vitals:  Vitals Value Taken Time  BP    Temp    Pulse 82 10/27/2018 12:14 PM  Resp 28 10/27/2018 12:13 PM  SpO2 98 % 10/27/2018 12:14 PM  Vitals shown include unvalidated device data.  Last Pain:  Vitals:   10/27/18 1053  TempSrc: Oral  PainSc: 0-No pain         Complications: No apparent anesthesia complications

## 2018-10-27 NOTE — Progress Notes (Signed)
O2 decreased to 3L [place] per Dr. Harrington Challenger, awaiting admit orders for patient at this time.

## 2018-10-27 NOTE — H&P (Signed)
History and Physical    [name] LEX:[id number] DOB: 1941-04-17 DOA: 10/27/2018  PCP: [name], MD  Patient coming from: Referred by cardiology after successful cardioversion but remained hypoxic.  I have personally briefly reviewed patient's old medical records in [place]  Chief Complaint: Hypoxemia  HPI: [name] is a 78 y.o. male with medical history significant of aortic valve disease status post TAVR, no neuropathy, obstructive sleep apnea, hypertension, hyperlipidemia, degenerative disc disease, status post back surgery x2 who underwent cardioversion today was noted to be hypoxemic on arrival to the hospital.  His sats were in the 84% range.  He uses 2 L of oxygen at home continuously.  He has a history of obstructive sleep apnea and COPD.  Patient states he has been wheezing recently and has had an upper respiratory infection.  He thought he had avoided it but his whole family has been sick at home recently with a what he calls "flu".  He is vague as to whether or not they were taking Tamiflu or whether or not they were positive for influenza virus. He was successfully cardioverted but despite that he remained hypoxemic and therefore is being referred to [facility] for further evaluation and management.   Review of Systems: As per HPI otherwise all other systems reviewed and  negative.   Past Medical History:  Diagnosis Date  . Anemia   . Aortic stenosis   . Aortic stenosis, severe    S/p Edwards Sapien 3 Transcatheter Heart Valve (size 26 mm, model # [id number], serial # [id number])  . Arthritis   . Back pain   . Bursitis   . Cataract    left immature  . Cellulitis 10/12/2015  . Complication of anesthesia    Halucinations  . Constipation    takes Miralax daily as well as Senokot daily  . DDD (degenerative disc disease)   . Gout    takes Allopurinol daily  . Heart valve disorder   . History of blood clots 1962   knee  . History of blood transfusion    no  abnormal reaction noted  . History of shingles   . Hyperlipidemia    takes Atorvastatin daily  . Hypertension    takes Lisinopril daily  . Joint pain   . Low back pain 01/25/2017  . Neck pain on left side 01/25/2017  . OSA (obstructive sleep apnea)   . Osteoarthritis   . Peripheral edema    takes Furosemide daily  . Peripheral neuropathy    takes Gabapentin daily  . Peroneal palsy    significant right foot drop  . Pneumonia 25+yrs ago   hx of  . Shortness of breath   . Swelling of extremity   . Urinary frequency   . Urinary urgency   . Valvular heart disease     Past Surgical History:  Procedure Laterality Date  . CARDIAC CATHETERIZATION N/A 05/02/2015   Procedure: Right/Left Heart Cath and Coronary Angiography;  Surgeon: Burnell Blanks, MD;  Location: [place] [facility];  Service: Cardiovascular;  Laterality: N/A;  . COLONOSCOPY N/A 02/07/2013   Procedure: COLONOSCOPY;  Surgeon: Juanita Craver, MD;  Location: [facility] ENDOSCOPY;  Service: Endoscopy;  Laterality: N/A;  . COLONOSCOPY N/A 02/09/2013   Procedure: COLONOSCOPY;  Surgeon: Beryle Beams, MD;  Location: [employer];  Service: Endoscopy;  Laterality: N/A;  . ESOPHAGOGASTRODUODENOSCOPY N/A 02/07/2013   Procedure: ESOPHAGOGASTRODUODENOSCOPY (EGD);  Surgeon: Juanita Craver, MD;  Location[facility] ENDOSCOPY;  Service: Endoscopy;  Laterality: N/A;  . GIVENS CAPSULE STUDY N/A 02/09/2013   Procedure: GIVENS CAPSULE STUDY;  Surgeon: Beryle Beams, MD;  Location: [place];  Service: Endoscopy;  Laterality: N/A;  . HERNIA REPAIR     umbilical hernia  . KNEE SURGERY Right    multiple knee surgeries due to complication of R TKA  . LAMINOTOMY  4401   c6-t2  . PACEMAKER IMPLANT N/A 04/27/2018   Procedure: PACEMAKER IMPLANT;  Surgeon: Constance Haw, MD;  Location: [place] [facility] LAB;  Service: Cardiovascular;  Laterality: N/A;  . SPINE SURGERY    . TEE WITHOUT CARDIOVERSION N/A 03/07/2015   Procedure: TRANSESOPHAGEAL ECHOCARDIOGRAM  (TEE);  Surgeon: Josue Hector, MD;  Location: [facility] ENDOSCOPY;  Service: Cardiovascular;  Laterality: N/A;  ANES TO BRING PROPOFOL PER DOCTOR  . TEE WITHOUT CARDIOVERSION N/A 06/10/2015   Procedure: TRANSESOPHAGEAL ECHOCARDIOGRAM (TEE);  Surgeon: Burnell Blanks, MD;  Location: [place];  Service: Open Heart Surgery;  Laterality: N/A;  . TOTAL KNEE ARTHROPLASTY     right  . TRANSCATHETER AORTIC VALVE REPLACEMENT, TRANSFEMORAL Left 06/10/2015   Procedure: TRANSCATHETER AORTIC VALVE REPLACEMENT, TRANSFEMORAL;  Surgeon: Burnell Blanks, MD;  Location: [place];  Service: Open Heart Surgery;  Laterality: Left;    Social History   Social History Narrative  . Not on file     reports that he quit smoking about 12 years ago. His smoking use included pipe. He quit after 30.00 years of use. He has never used smokeless tobacco. He reports that he does not drink alcohol or use drugs.  Allergies  Allergen Reactions  . Coumadin [Warfarin Sodium] Other (See Comments)    States he can't be on this-bleeds out    Family History  Problem Relation Age of Onset  . Other Father        POSSIBLE HEART ATTACK  . Arthritis Sister        "crippling"  . Arthritis Sister      Prior to Admission medications   Medication Sig Start Date End Date Taking? Authorizing Provider  acetaminophen (TYLENOL) 500 MG tablet Take 1,000 mg by mouth 2 (two) times daily.    Yes [provider]  aspirin 81 MG EC tablet TAKE 1 TABLET (81 MG TOTAL) BY MOUTH DAILY. 06/01/16  Yes Josue Hector, MD  atorvastatin (LIPITOR) 40 MG tablet Take 1 and a half tablet daily by mouth  (12m) Patient taking differently: Take 60 mg by mouth daily. Take 1 and a half tablet daily by mouth  (6107m 05/23/18  Yes BlMosie LukesMD  Coenzyme Q10 (COQ10 PO) Take 300 mg by mouth daily.    Yes [provider]  ELDERBERRY PO Take 2 tablets by mouth daily.    Yes [provider]  ELIQUIS 5 MG TABS tablet TAKE 1 TABLET  BY MOUTH TWICE A DAY Patient taking differently: Take 5 mg by mouth 2 (two) times daily.  10/18/18  Yes CaSherran NeedsNP  Ferrous Fumarate-Folic Acid (HEMATINIC/FOLIC ACID) 32027-2G TABS Take 1 tablet by mouth 2 (two) times a week. 06/29/18  Yes BlMosie LukesMD  furosemide (LASIX) 20 MG tablet Take 1 tablet (20 mg total) by mouth 2 (two) times daily. 09/05/18  Yes NiJosue HectorMD  gabapentin (NEURONTIN) 300 MG capsule Take 1 capsule (300 mg total) by mouth 3 (three) times daily. 05/23/18  Yes BlMosie LukesMD  Menthol, Topical Analgesic, (BENGAY EX) Apply 1 application topically at bedtime.  Yes [provider]  Misc Natural Products (TART CHERRY ADVANCED PO) Take 1,200 mg by mouth daily.    Yes [provider]  Multiple Vitamin (MULTIVITAMIN WITH MINERALS) TABS Take 1 tablet by mouth daily.   Yes [provider]  polyethylene glycol (MIRALAX / GLYCOLAX) packet Take 17 g by mouth daily.   Yes [provider]  sennosides-docusate sodium (SENOKOT-S) 8.6-50 MG tablet Take 1 tablet by mouth 2 (two) times daily.   Yes [provider]  vitamin C (ASCORBIC ACID) 500 MG tablet Take 500 mg by mouth daily.   Yes [provider]  VITAMIN D PO Take 5,000 Units by mouth daily.    Yes [provider]    Physical Exam:  Constitutional: NAD, calm, comfortable super morbidly obese,  Vitals:   10/27/18 1306 10/27/18 1307 10/27/18 1308 10/27/18 1410  BP:   105/84 138/69  Pulse:   84 96  Resp:   17 16  SpO2: (!) 83% (!) 77% 92% (!) 88%   Eyes: PERRL, lids and conjunctivae normal ENMT: Mucous membranes are moist. Posterior pharynx clear of any exudate or lesions.Normal dentition. nasal congestion Neck: normal, supple, no masses, no thyromegaly Respiratory: Distant breath sounds bilaterally with faint wheezing, no crackles. Normal respiratory effort. No accessory muscle use.  Cardiovascular: Regular rate and rhythm, no murmurs / rubs /  gallops. No extremity edema. 2+ pedal pulses. No carotid bruits.  Abdomen: no tenderness, no masses palpated. No hepatosplenomegaly. Bowel sounds positive.  Musculoskeletal: no clubbing / cyanosis. No joint deformity upper and lower extremities. Good ROM, no contractures. Normal muscle tone.  Skin: no rashes, lesions, ulcers. No induration Neurologic: CN 2-12 grossly intact. Sensation intact, DTR normal. Strength 5/5 in all 4.  Psychiatric: Normal judgment and insight. Alert and oriented x 3. Normal mood.    Labs on Admission: I have personally reviewed following labs and imaging studies  CBC: Recent Labs  Lab 10/27/18 1107  HGB 12.2*  HCT 80.9*   Basic Metabolic Panel: Recent Labs  Lab 10/27/18 1107  NA 141  K 4.2  GLUCOSE 100*   Urine analysis:    Component Value Date/Time   COLORURINE YELLOW 04/26/2018 1012   APPEARANCEUR CLEAR 04/26/2018 1012   LABSPEC 1.010 04/26/2018 1012   PHURINE 5.0 04/26/2018 1012   GLUCOSEU NEGATIVE 04/26/2018 1012   HGBUR NEGATIVE 04/26/2018 1012   [facility] 04/26/2018 1012   BILIRUBINUR negative 04/12/2018 [place] 04/26/2018 1012   PROTEINUR NEGATIVE 04/26/2018 1012   UROBILINOGEN 0.2 04/12/2018 1605   UROBILINOGEN 0.2 01/17/2009 1415   NITRITE NEGATIVE 04/26/2018 1012   LEUKOCYTESUR NEGATIVE 04/26/2018 1012    Radiological Exams on Admission: Dg Chest 2 View  Result Date: 10/27/2018 CLINICAL DATA:  Dyspnea and shortness of breath for the past 2 days. EXAM: CHEST - 2 VIEW COMPARISON:  10/06/2018. FINDINGS: Stable enlarged cardiac silhouette and left subclavian pacemaker leads. The lungs remain clear with normal vascularity. Right shoulder degenerative changes and thoracic spine degenerative changes. IMPRESSION: No acute abnormality. Stable cardiomegaly. Electronically Signed   By: Claudie Revering M.D.   On: 10/27/2018 14:14    EKG: Independently reviewed. probable atrial flutter with V pacing. Left axis  deviation Left ventricular hypertrophy with QRS widening and repolarization abnormality Lateral infarct , age undetermined Inferior infarct , age undetermined Abnormal ECG No significant change since last tracing  Echocardiogram 04/27/2018: Left ventricle: The cavity size was normal. Wall thickness was   increased in a pattern of moderate  LVH. Systolic function was   normal. The estimated ejection fraction was in the range of 60%   to 65%. Wall motion was normal; there were no regional wall   motion abnormalities. The study is not technically sufficient to   allow evaluation of LV diastolic function. - Aortic valve: s/p TAVR. Mild paravalvular leak. Mean gradient   (S): 22 mm Hg. Peak gradient (S): 42 mm Hg. Valve area (VTI): 1.6   cm^2. Valve area (Vmax): 1.76 cm^2. - Mitral valve: Heavy MAC. Moderate mitral stenosis. Trivial   regurgitation. Mean gradient (D): 13 mm Hg. Valve area by   pressure half-time: 1.17 cm^2. - Left atrium: Severely dilated. - Right ventricle: The cavity size was mildly dilated. - Right atrium: Severely dilated. - Systemic veins: The IVC measures >2.1 cm, but collapses more than   50%, suggesting an elevated RA pressure of 8 mmHg. Impressions: - Compared to a prior study in 2017, the gradient across the TAVR   valve has increased to 22 mmHg. There is mild paravalvular leak.   LVEF is higher at 60-65%. There is moderate mitral stenosis with   a mean gradient of 13 mmHg and MVA around 1.17 cm2. Severe   biatrial enlargment is noted.  Assessment/Plan Principal Problem:   Hypoxia Active Problems:   COPD mixed type (HCC)   Shortness of breath on exertion   Upper respiratory infection, acute   HYPERCHOLESTEROLEMIA   Obstructive sleep apnea   Chronic venous insufficiency   Essential hypertension   OBESITY, MORBID  1.  Hypoxia: Likely related to viral URI.  Check viral respiratory panel and allow him to wake up from his propofol.  We will start him on  nebulizers scheduled and PRN.  Will monitor very closely.  2.  COPD: I do not appreciate an extensive exacerbation.  I think mostly his hypoxia is due to his extraordinarily poor conditioning and body habitus.  Continue treatments.  3.  Shortness of breath on exertion.  This is chronic.  Continue oxygen supplementation.  4.  Acute upper respiratory infection: Likely viral.  Will check a respiratory virus panel.  If positive for flu will consider Tamiflu.  5.  Hypercholesterolemia: Continue home medication management.  6.  Obstructive sleep apnea: Patient requested to bring in his home CPAP machine for use tonight.  I suspect he has a component of obesity hypoventilation syndrome.  7.  Chronic venous insufficiency: We will start Lac-Hydrin lotion twice daily  8.  Central hypertension: Continue home medication management.  9.  Super morbid obesity: Patient with a BMI of 48.  Certainly would benefit from dietary counseling and a closely monitored program.    DVT prophylaxis: Subcu heparin Code Status: Full code Family Communication: No family present at the time of admission.  Patient retains capacity. Disposition Plan: Likely home in a.m. Consults called: None Admission status: Observation   Lady Deutscher MD FACP [facility] Pager [phone]  How to contact the [facility] Attending or Consulting provider [place] or covering provider during after hours [place], for this patient?  1. Check the care team in [facility] and look for a) attending/consulting [facility] provider listed and b) the [facility] team listed 2. Log into [email or web address] and use [place]'s universal password to access. If you do not have the password, please contact the hospital operator. 3. Locate the [facility] provider you are looking for under [facility] and page to a number that you can be directly reached. 4. If you still have  difficulty reaching the provider, please page the [facility] (Director on Call) for the Hospitalists  listed on amion for assistance.  If 7PM-7AM, please contact night-coverage [email or web address] Password [facility]  10/27/2018, 2:58 PM

## 2018-10-27 NOTE — Progress Notes (Signed)
RT Note:  Patient brought his own machine from home.  He self manages his machine.

## 2018-10-27 NOTE — CV Procedure (Signed)
Cardioversion  Patient sedated by anesthesia with IV Propofol  With pads in APex / base position, patient cardioverted to SR with 200 J synchronized biphasic energy  Procedure without complications  Pacer interrrogated by industry.

## 2018-10-27 NOTE — Interval H&P Note (Signed)
History and Physical Interval Note:  10/27/2018 10:57 AM  [name]  has presented today for surgery, with the diagnosis of A-FIB  The various methods of treatment have been discussed with the patient and family. After consideration of risks, benefits and other options for treatment, the patient has consented to  Procedure(s): CARDIOVERSION (N/A) as a surgical intervention .  The patient's history has been reviewed, patient examined, no change in status, stable for surgery.  I have reviewed the patient's chart and labs.  Questions were answered to the patient's satisfaction.     Dorris Carnes

## 2018-10-27 NOTE — Progress Notes (Signed)
Patient is s/p cardioversion for atrial flutter   O2 saturations on RA were 77%   On 4 L [place] were 88 to 89%  Pt wears CPAP at home    No O2 for daytime O2 sates in clinic not always checked  Those documented were 90 to 92%  Recomm:   Admit to observation on IM service. Optimize O2   Arrange from home O2      From cardiac standpoint, keep on telemetry   Continue meds

## 2018-10-27 NOTE — Anesthesia Preprocedure Evaluation (Addendum)
Anesthesia Evaluation  Patient identified by MRN, date of birth, ID band Patient awake    Reviewed: Allergy & Precautions, NPO status , Patient's Chart, lab work & pertinent test results  Airway Mallampati: II  TM Distance: >3 FB Neck ROM: Full    Dental no notable dental hx.    Pulmonary sleep apnea , COPD,  oxygen dependent, former smoker,    Pulmonary exam normal breath sounds clear to auscultation       Cardiovascular hypertension, Normal cardiovascular exam+ dysrhythmias Atrial Fibrillation + Valvular Problems/Murmurs AS  Rhythm:Regular Rate:Normal  ECG: rate 77. Sinus rhythm with 1st degree A-V block with occasional Premature ventricular complexes Left axis deviation Left ventricular hypertrophy with QRS widening and repolarization abnormality   Neuro/Psych Peroneal palsy  Neuromuscular disease negative psych ROS   GI/Hepatic negative GI ROS, Neg liver ROS,   Endo/Other  Morbid obesity  Renal/GU negative Renal ROS     Musculoskeletal Gout    Abdominal (+) + obese,   Peds  Hematology  (+) anemia , HLD   Anesthesia Other Findings A-FIB  Reproductive/Obstetrics                            Anesthesia Physical Anesthesia Plan  ASA: IV  Anesthesia Plan: General   Post-op Pain Management:    Induction: Intravenous  PONV Risk Score and Plan: 2 and Propofol infusion and Treatment may vary due to age or medical condition  Airway Management Planned: Mask  Additional Equipment:   Intra-op Plan:   Post-operative Plan:   Informed Consent: I have reviewed the patients History and Physical, chart, labs and discussed the procedure including the risks, benefits and alternatives for the proposed anesthesia with the patient or authorized representative who has indicated his/her understanding and acceptance.     Dental advisory given  Plan Discussed with: CRNA  Anesthesia Plan Comments:         Anesthesia Quick Evaluation

## 2018-10-27 NOTE — Progress Notes (Signed)
Unable to wean patient to room air following outpatient cardioversion. O2 sats dropped to 77% on room air. Patient placed back on 4L [place]. Dr. Harrington Challenger notified, she will speak to patient and wife at bedside.

## 2018-10-27 NOTE — Interval H&P Note (Signed)
History and Physical Interval Note:  10/27/2018 11:51 AM  [name]  has presented today for surgery, with the diagnosis of A-FIB  The various methods of treatment have been discussed with the patient and family. After consideration of risks, benefits and other options for treatment, the patient has consented to  Procedure(s): CARDIOVERSION (N/A) as a surgical intervention .  The patient's history has been reviewed, patient examined, no change in status, stable for surgery.  I have reviewed the patient's chart and labs.  Questions were answered to the patient's satisfaction.     Dorris Carnes

## 2018-10-28 ENCOUNTER — Other Ambulatory Visit: Payer: Self-pay

## 2018-10-28 DIAGNOSIS — R0600 Dyspnea, unspecified: Secondary | ICD-10-CM

## 2018-10-28 DIAGNOSIS — Z7901 Long term (current) use of anticoagulants: Secondary | ICD-10-CM | POA: Diagnosis not present

## 2018-10-28 DIAGNOSIS — Z96651 Presence of right artificial knee joint: Secondary | ICD-10-CM | POA: Diagnosis present

## 2018-10-28 DIAGNOSIS — I5033 Acute on chronic diastolic (congestive) heart failure: Secondary | ICD-10-CM | POA: Diagnosis present

## 2018-10-28 DIAGNOSIS — I872 Venous insufficiency (chronic) (peripheral): Secondary | ICD-10-CM

## 2018-10-28 DIAGNOSIS — J449 Chronic obstructive pulmonary disease, unspecified: Secondary | ICD-10-CM

## 2018-10-28 DIAGNOSIS — I110 Hypertensive heart disease with heart failure: Secondary | ICD-10-CM | POA: Diagnosis present

## 2018-10-28 DIAGNOSIS — Z79899 Other long term (current) drug therapy: Secondary | ICD-10-CM | POA: Diagnosis not present

## 2018-10-28 DIAGNOSIS — E7800 Pure hypercholesterolemia, unspecified: Secondary | ICD-10-CM | POA: Diagnosis present

## 2018-10-28 DIAGNOSIS — J9601 Acute respiratory failure with hypoxia: Secondary | ICD-10-CM | POA: Diagnosis present

## 2018-10-28 DIAGNOSIS — Z6841 Body Mass Index (BMI) 40.0 and over, adult: Secondary | ICD-10-CM | POA: Diagnosis not present

## 2018-10-28 DIAGNOSIS — I4819 Other persistent atrial fibrillation: Secondary | ICD-10-CM | POA: Diagnosis present

## 2018-10-28 DIAGNOSIS — Z952 Presence of prosthetic heart valve: Secondary | ICD-10-CM | POA: Diagnosis not present

## 2018-10-28 DIAGNOSIS — Z87891 Personal history of nicotine dependence: Secondary | ICD-10-CM | POA: Diagnosis not present

## 2018-10-28 DIAGNOSIS — Z950 Presence of cardiac pacemaker: Secondary | ICD-10-CM | POA: Diagnosis not present

## 2018-10-28 DIAGNOSIS — R0902 Hypoxemia: Secondary | ICD-10-CM | POA: Diagnosis present

## 2018-10-28 DIAGNOSIS — D649 Anemia, unspecified: Secondary | ICD-10-CM | POA: Diagnosis present

## 2018-10-28 DIAGNOSIS — Z9981 Dependence on supplemental oxygen: Secondary | ICD-10-CM | POA: Diagnosis not present

## 2018-10-28 DIAGNOSIS — G4733 Obstructive sleep apnea (adult) (pediatric): Secondary | ICD-10-CM

## 2018-10-28 DIAGNOSIS — E785 Hyperlipidemia, unspecified: Secondary | ICD-10-CM | POA: Diagnosis present

## 2018-10-28 DIAGNOSIS — Z86718 Personal history of other venous thrombosis and embolism: Secondary | ICD-10-CM | POA: Diagnosis not present

## 2018-10-28 DIAGNOSIS — Z888 Allergy status to other drugs, medicaments and biological substances status: Secondary | ICD-10-CM | POA: Diagnosis not present

## 2018-10-28 DIAGNOSIS — I4892 Unspecified atrial flutter: Secondary | ICD-10-CM | POA: Diagnosis present

## 2018-10-28 DIAGNOSIS — Z8261 Family history of arthritis: Secondary | ICD-10-CM | POA: Diagnosis not present

## 2018-10-28 DIAGNOSIS — Z8619 Personal history of other infectious and parasitic diseases: Secondary | ICD-10-CM | POA: Diagnosis not present

## 2018-10-28 LAB — BASIC METABOLIC PANEL
Anion gap: 11 (ref 5–15)
BUN: 22 mg/dL (ref 8–23)
CO2: 31 mmol/L (ref 22–32)
Calcium: 8.8 mg/dL — ABNORMAL LOW (ref 8.9–10.3)
Chloride: 101 mmol/L (ref 98–111)
Creatinine, Ser: 1.28 mg/dL — ABNORMAL HIGH (ref 0.61–1.24)
GFR calc Af Amer: >60 mL/min (ref >60)
GFR calc non Af Amer: 54 mL/min — ABNORMAL LOW (ref >60)
Glucose, Bld: 115 mg/dL — ABNORMAL HIGH (ref 70–99)
Potassium: 4.8 mmol/L (ref 3.5–5.1)
Sodium: 143 mmol/L (ref 135–145)

## 2018-10-28 LAB — CBC
HCT: 36 % — ABNORMAL LOW (ref 39.0–52.0)
Hemoglobin: 10.7 g/dL — ABNORMAL LOW (ref 13.0–17.0)
MCH: 29.5 pg (ref 26.0–34.0)
MCHC: 29.7 g/dL — ABNORMAL LOW (ref 30.0–36.0)
MCV: 99.2 fL (ref 80.0–100.0)
Platelets: 132 10*3/uL — ABNORMAL LOW (ref 150–400)
RBC: 3.63 MIL/uL — ABNORMAL LOW (ref 4.22–5.81)
RDW: 18.2 % — ABNORMAL HIGH (ref 11.5–15.5)
WBC: 8.6 10*3/uL (ref 4.0–10.5)
nRBC: 0 % (ref 0.0–0.2)

## 2018-10-28 LAB — TROPONIN I: Troponin I: 0.04 ng/mL (ref <0.03)

## 2018-10-28 MED ORDER — FUROSEMIDE 10 MG/ML IJ SOLN
40.0000 mg | Freq: Two times a day (BID) | INTRAMUSCULAR | Status: DC
  Administered 2018-10-28 – 2018-10-29 (×4): 40 mg via INTRAVENOUS
  Filled 2018-10-28 (×4): qty 4

## 2018-10-28 NOTE — Progress Notes (Signed)
Pt had already placed self on bipap for the night.

## 2018-10-28 NOTE — Care Management Obs Status (Signed)
[place] NOTIFICATION   Patient Details  Name: ELHADJI PECORE MRN: [id number] Date of Birth: [birth year]   Medicare Observation Status Notification Given:  Yes    Carles Collet, RN 10/28/2018, 10:39 AM

## 2018-10-28 NOTE — Evaluation (Addendum)
Physical Therapy Evaluation Patient Details Name: [name] MRN: [id number] DOB: 1941-09-11 Today's Date: 10/28/2018   History of Present Illness  Patient is a 78 y/o male who presents with hypoxia s/p cardioversion for A flutter. PMH includes pacemaker, COPD, morbid obesity, OSA on CPAP, HTN, s/p TAVR.   Clinical Impression  Patient presents with generalized weakness, dyspnea on exertion, decreased activity tolerance and impaired mobility s/p above. Tolerated transfers and gait training with close Min guard for balance/safety. Pt has his own way of doing things with own equipment from home in room (bil platform RW, toilet riser, w/c). Pt does short distnace ambulation in home with bil platform RW and uses scooter for community ambulation. Wife assists with ADLs- esp lower body dressing/bathing. Sp02 dropped to 84% on RA at rest and 87% on 3L/min 02 with mobility. 3-4/4 DOE with mobility requiring long rest break. HR up to 115 bpm irregular rhythm with 13 PVCs. Will likely need supplemental 02 at home for daytime use. Wears 02 at night currently. Will follow acutely to maximize independence and mobility prior to return home.    Follow Up Recommendations Home health PT;Supervision for mobility/OOB    Equipment Recommendations  None recommended by PT    Recommendations for Other Services       Precautions / Restrictions Precautions Precautions: Fall Restrictions Weight Bearing Restrictions: No      Mobility  Bed Mobility Overal bed mobility: Needs Assistance Bed Mobility: Sit to Supine       Sit to supine: Mod assist;HOB elevated   General bed mobility comments: Up in chair upon PT arrival. ASsist to bring LEs into bed to return to supine.  Transfers Overall transfer level: Needs assistance Equipment used: Rolling walker (2 wheeled) Transfers: Sit to/from Stand Sit to Stand: Min guard         General transfer comment: Min guard for safety with increased time, from  elevated seat, holding w/c to prevent from rolling back. Stood from w/c x1, from toilet x1 (riser brought from home on toilet).  Ambulation/Gait Ambulation/Gait assistance: Min guard Gait Distance (Feet): 18 Feet(x2 bouts) Assistive device: Rolling walker (2 wheeled)(bil plat forms) Gait Pattern/deviations: Step-through pattern;Decreased stride length;Trunk flexed;Wide base of support Gait velocity: decreased   General Gait Details: Leaning on forearms with slow steady gait. Difficulty with turns. 3-4/4 DOE. Sp02 dropped to 87% on 3L/min 02. Cues for pursed lip breathing as pt mouth breather.   Stairs            Wheelchair Mobility    Modified Rankin (Stroke Patients Only)       Balance Overall balance assessment: Needs assistance Sitting-balance support: Feet supported;No upper extremity supported Sitting balance-Leahy Scale: Fair     Standing balance support: During functional activity;Bilateral upper extremity supported Standing balance-Leahy Scale: Poor Standing balance comment: Total A for pericare; leaning on platform BUEs for support.                             Pertinent Vitals/Pain Pain Assessment: No/denies pain    Home Living Family/patient expects to be discharged to:: Private residence Living Arrangements: Spouse/significant other Available Help at Discharge: Family;Available 24 hours/day Type of Home: House Home Access: Other (comment)(lift ramp)     Home Layout: Two level;Able to live on main level with bedroom/bathroom Home Equipment: Gilford Rile - 2 wheels;Other (comment);Transport chair;Electric scooter(lift chair)      Prior Function Level of Independence: Needs assistance   Gait /  Transfers Assistance Needed: uses bilat plaform RW for ambulation in the house and electric scooter for mobility outside of house. Drives.   ADL's / Homemaking Assistance Needed: Wife assists with ADLs. Has a badea at home for toileting.        Hand  Dominance   Dominant Hand: Right    Extremity/Trunk Assessment   Upper Extremity Assessment Upper Extremity Assessment: Defer to OT evaluation    Lower Extremity Assessment Lower Extremity Assessment: Generalized weakness    Cervical / Trunk Assessment Cervical / Trunk Assessment: Kyphotic  Communication   Communication: No difficulties  Cognition Arousal/Alertness: Awake/alert Behavior During Therapy: WFL for tasks assessed/performed Overall Cognitive Status: Within Functional Limits for tasks assessed                                        General Comments General comments (skin integrity, edema, etc.): WIfe present during session.    Exercises     Assessment/Plan    PT Assessment Patient needs continued PT services  PT Problem List Decreased strength;Decreased mobility;Decreased balance;Cardiopulmonary status limiting activity;Decreased activity tolerance;Obesity       PT Treatment Interventions Functional mobility training;Balance training;Patient/family education;Gait training;Therapeutic activities;Therapeutic exercise;Wheelchair mobility training    PT Goals (Current goals can be found in the Care Plan section)  Acute Rehab PT Goals Patient Stated Goal: to get home today PT Goal Formulation: With patient Time For Goal Achievement: 11/11/18 Potential to Achieve Goals: Good    Frequency Min 3X/week   Barriers to discharge        Co-evaluation               AM-PAC PT "6 Clicks" Mobility  Outcome Measure Help needed turning from your back to your side while in a flat bed without using bedrails?: A Lot Help needed moving from lying on your back to sitting on the side of a flat bed without using bedrails?: A Lot Help needed moving to and from a bed to a chair (including a wheelchair)?: A Little Help needed standing up from a chair using your arms (e.g., wheelchair or bedside chair)?: A Little Help needed to walk in hospital room?: A  Little Help needed climbing 3-5 steps with a railing? : A Lot 6 Click Score: 15    End of Session Equipment Utilized During Treatment: Oxygen Activity Tolerance: Patient limited by fatigue;Treatment limited secondary to medical complications (Comment)(drop in Sp02) Patient left: in bed;with call bell/phone within reach;with bed alarm set;with family/visitor present Nurse Communication: Mobility status;Other (comment)(02 sat) PT Visit Diagnosis: Unsteadiness on feet (R26.81);Difficulty in walking, not elsewhere classified (R26.2)    Time: 7494-4967 PT Time Calculation (min) (ACUTE ONLY): 38 min   Charges:   PT Evaluation $PT Eval Moderate Complexity: 1 Mod PT Treatments $Gait Training: 8-22 mins $Therapeutic Activity: 8-22 mins        Wray Kearns, [place], DPT Acute Rehabilitation Services Pager [phone] Office [place] 10/28/2018, 10:21 AM

## 2018-10-29 LAB — BASIC METABOLIC PANEL
Anion gap: 12 (ref 5–15)
BUN: 27 mg/dL — ABNORMAL HIGH (ref 8–23)
CO2: 31 mmol/L (ref 22–32)
Calcium: 8.8 mg/dL — ABNORMAL LOW (ref 8.9–10.3)
Chloride: 99 mmol/L (ref 98–111)
Creatinine, Ser: 1.3 mg/dL — ABNORMAL HIGH (ref 0.61–1.24)
GFR calc Af Amer: >60 mL/min (ref >60)
GFR calc non Af Amer: 53 mL/min — ABNORMAL LOW (ref >60)
Glucose, Bld: 96 mg/dL (ref 70–99)
Potassium: 4.3 mmol/L (ref 3.5–5.1)
Sodium: 142 mmol/L (ref 135–145)

## 2018-10-29 LAB — TROPONIN I: Troponin I: 0.05 ng/mL (ref <0.03)

## 2018-10-29 MED ORDER — IPRATROPIUM-ALBUTEROL 0.5-2.5 (3) MG/3ML IN SOLN
3.0000 mL | Freq: Three times a day (TID) | RESPIRATORY_TRACT | Status: DC
  Administered 2018-10-29 – 2018-11-01 (×10): 3 mL via RESPIRATORY_TRACT
  Filled 2018-10-29 (×10): qty 3

## 2018-10-29 NOTE — Progress Notes (Signed)
Progress Note    [name]  AUQ:[id number] DOB: 06/16/1941  DOA: 10/27/2018 PCP: [name], MD    Brief Narrative:   Chief complaint: Shortness of breath  Medical records reviewed and are as summarized below:  [name] is an 78 y.o. male with past medical history significant for HTN, HLD, s/p TAVR, s/p PM, chronic diastolic CHF, paroxysmal atrial fibrillation, morbid obesity, DDD, and OSA on BiPAP; who had undergone cardioversion with Dr. Harrington Challenger and was noted to remain hypoxic.  Normally patient only requires oxygen at night. -Of note he weighed 157 kg ( 346 pounds ) on 09/25/2017, at the next cardiology visit on 1/17 he weighed 169 kg, and on admission he weighed 171 kg (378 lbs), which equates to over 30 pound weight gain, I strongly suspect most of this is fluid  Assessment/Plan:   Acute respiratory failure with hypoxia:  -I suspect this is multifactorial but predominantly driven by fluid overload  -In the background of COPD, OSA/OHS  -Do not suspect COPD exacerbation at this time -See below, baseline only uses O2 at night, suspect he will need this continuously -Continue CPAP nightly  Acute on chronic diastolic CHF -Echo 6/389 9 with EF of 60% -Significant 30 pound, 14 kg weight gain over 1 month -He takes 20 mg of Lasix twice daily -Continue IV Lasix 40 mg every 12, monitor I/O, weights, bmet  COPD:  -No significant wheezing appreciated. -Continue breathing treatments scheduled  Atrial flutter/fibrillation with rapid ventricular response  -Is post permanent pacemaker, he was cardioverted to sinus rhythm 2/7 -Currently appears V paced, continue Eliquis  History of severe AS -status post TAVR in 2016 and PPM for AV block  Essential hypertension: Stable. -Continue to monitor  Hyperlipidemia -Continue atorvastatin  Chronic venous insufficiency -Lac-Hydrin lotion twice daily  OSA on's BiPAP: Patient has BiPAP at bedside. -Continue use at  night  Morbid obesity: Body mass index is 48.74 kg/m.   Family Communication/Anticipated D/C date and plan/Code Status   DVT prophylaxis: Eliquis Code Status: Full Code.  Family Communication: No family present at bedside Disposition Plan: Home in few days pending adequate diuresis   Medical Consultants:    None.   Anti-Infectives:    None  Subjective:   -Reports progressive shortness of breath for over a week prior to admission  Objective:    Vitals:   10/29/18 0108 10/29/18 0511 10/29/18 0744 10/29/18 1059  BP: 113/74 111/77  124/84  Pulse: 85 68  88  Resp: 20 18    Temp: 97.8 F (36.6 C) 97.7 F (36.5 C)    TempSrc: Oral Oral    SpO2: 96% 91% 94%   Weight:  (!) 171.5 kg    Height:        Intake/Output Summary (Last 24 hours) at 10/29/2018 1121 Last data filed at 10/29/2018 3734 Gross per 24 hour  Intake 720 ml  Output 625 ml  Net 95 ml   Filed Weights   10/27/18 1900 10/28/18 0257 10/29/18 0511  Weight: (!) 171.5 kg (!) 172.2 kg (!) 171.5 kg    Exam Gen: Morbidly obese elderly male, sitting up in bed, no distress HEENT: PERRLA, Neck supple, no JVD Lungs: Good air movement, decreased breath sounds at both bases CVS: RRR,No Gallops,Rubs or new Murmurs Abd: soft, Non tender, non distended, BS present Extremities: 1+ edema bilaterally Skin: no new rashes   Data Reviewed:   I have personally reviewed following labs and imaging studies:  Labs: Labs  show the following:   Basic Metabolic Panel: Recent Labs  Lab 10/27/18 1107 10/27/18 1601 10/28/18 0740 10/29/18 0512  NA 141 142 143 142  K 4.2 6.0* 4.8 4.3  CL  --  102 101 99  CO2  --  32 31 31  GLUCOSE 100* 137* 115* 96  BUN  --  24* 22 27*  CREATININE  --  1.20 1.28* 1.30*  CALCIUM  --  9.0 8.8* 8.8*   GFR Estimated Creatinine Clearance: 80.6 mL/min (A) (by C-G formula based on SCr of 1.3 mg/dL (H)). Liver Function Tests: No results for input(s): AST, ALT, ALKPHOS, BILITOT, PROT,  ALBUMIN in the last 168 hours. No results for input(s): LIPASE, AMYLASE in the last 168 hours. No results for input(s): AMMONIA in the last 168 hours. Coagulation profile No results for input(s): INR, PROTIME in the last 168 hours.  CBC: Recent Labs  Lab 10/27/18 1107 10/27/18 1601 10/28/18 0740  WBC  --  7.2 8.6  HGB 12.2* 10.8* 10.7*  HCT 36.0* 36.9* 36.0*  MCV  --  98.7 99.2  PLT  --  128* 132*   Cardiac Enzymes: Recent Labs  Lab 10/28/18 0829 10/29/18 0512  TROPONINI 0.04* 0.05*   BNP (last 3 results) No results for input(s): PROBNP in the last 8760 hours. CBG: No results for input(s): GLUCAP in the last 168 hours. D-Dimer: No results for input(s): DDIMER in the last 72 hours. Hgb A1c: No results for input(s): HGBA1C in the last 72 hours. Lipid Profile: No results for input(s): CHOL, HDL, LDLCALC, TRIG, CHOLHDL, LDLDIRECT in the last 72 hours. Thyroid function studies: No results for input(s): TSH, T4TOTAL, T3FREE, THYROIDAB in the last 72 hours.  Invalid input(s): FREET3 Anemia work up: No results for input(s): VITAMINB12, FOLATE, FERRITIN, TIBC, IRON, RETICCTPCT in the last 72 hours. Sepsis Labs: Recent Labs  Lab 10/27/18 1601 10/28/18 0740  WBC 7.2 8.6    Microbiology Recent Results (from the past 240 hour(s))  Respiratory Panel by PCR     Status: None   Collection Time: 10/27/18  2:54 PM  Result Value Ref Range Status   Adenovirus NOT DETECTED NOT DETECTED Final   Coronavirus 229E NOT DETECTED NOT DETECTED Final    Comment: (NOTE) The Coronavirus on the Respiratory Panel, DOES NOT test for the novel  Coronavirus (2019 nCoV)    Coronavirus HKU1 NOT DETECTED NOT DETECTED Final   Coronavirus NL63 NOT DETECTED NOT DETECTED Final   Coronavirus OC43 NOT DETECTED NOT DETECTED Final   Metapneumovirus NOT DETECTED NOT DETECTED Final   Rhinovirus / Enterovirus NOT DETECTED NOT DETECTED Final   Influenza A NOT DETECTED NOT DETECTED Final   Influenza B  NOT DETECTED NOT DETECTED Final   Parainfluenza Virus 1 NOT DETECTED NOT DETECTED Final   Parainfluenza Virus 2 NOT DETECTED NOT DETECTED Final   Parainfluenza Virus 3 NOT DETECTED NOT DETECTED Final   Parainfluenza Virus 4 NOT DETECTED NOT DETECTED Final   Respiratory Syncytial Virus NOT DETECTED NOT DETECTED Final   Bordetella pertussis NOT DETECTED NOT DETECTED Final   Chlamydophila pneumoniae NOT DETECTED NOT DETECTED Final   Mycoplasma pneumoniae NOT DETECTED NOT DETECTED Final    Comment: Performed at [facility], [place]. [address] [place], [place] [address]    Procedures and diagnostic studies:  Dg Chest 2 View  Result Date: 10/27/2018 CLINICAL DATA:  Dyspnea and shortness of breath for the past 2 days. EXAM: CHEST - 2 VIEW COMPARISON:  10/06/2018. FINDINGS: Stable  enlarged cardiac silhouette and left subclavian pacemaker leads. The lungs remain clear with normal vascularity. Right shoulder degenerative changes and thoracic spine degenerative changes. IMPRESSION: No acute abnormality. Stable cardiomegaly. Electronically Signed   By: Claudie Revering M.D.   On: 10/27/2018 14:14    Medications:   . acetaminophen  1,000 mg Oral BID  . apixaban  5 mg Oral BID  . aspirin EC  81 mg Oral Daily  . atorvastatin  60 mg Oral Daily  . cholecalciferol  5,000 Units Oral Daily  . [START ON 10/30/2018] ferrous [id number] C-folic acid  1 capsule Oral Once per day on Mon Thu  . furosemide  40 mg Intravenous BID  . gabapentin  300 mg Oral TID  . ipratropium-albuterol  3 mL Nebulization TID  . multivitamin with minerals  1 tablet Oral Daily  . polyethylene glycol  17 g Oral Daily  . senna-docusate  1 tablet Oral BID  . sodium chloride flush  3 mL Intravenous Q12H  . vitamin C  500 mg Oral Daily   Continuous Infusions:   LOS: 1 day   [facility]

## 2018-10-29 NOTE — Progress Notes (Signed)
Patient resting comfortably during shift report. Denies complaints.  

## 2018-10-29 NOTE — Progress Notes (Addendum)
Progress Note    [name]  HKV:[id number] DOB: 1941-03-31  DOA: 10/27/2018 PCP: [name], MD    Brief Narrative:   Chief complaint: Shortness of breath  Medical records reviewed and are as summarized below:  [name] is an 78 y.o. male with past medical history significant for HTN, HLD, s/p TAVR, s/p PM, morbid obesity, DDD, and OSA on BiPAP; who had undergone cardioversion with Dr. Radford Pax and was noted to remain hypoxic.  Normally patient only requires oxygen at night.  Assessment/Plan:   Principal Problem:   Hypoxia Active Problems:   HYPERCHOLESTEROLEMIA   OBESITY, MORBID   Obstructive sleep apnea   Chronic venous insufficiency   COPD mixed type (HCC)   Shortness of breath on exertion   Essential hypertension   Upper respiratory infection, acute  Acute respiratory failure with hypoxia: Symptoms initially thought to be likely related to some viral etiology as patient notes wife and son recently diagnosed with the flu.  However respiratory virus panel was noted to be negative.  Patient seen to desat into the 80s with talking, but appears to be in no acute distress.  Suspect symptoms might likely be multifactorial considering patient's body habitus. -Continuous pulse oximetry with nasal cannula oxygen -Wean as tolerated  Possible diastolic CHF exacerbation: Last EF noted to be 60 to 65% in 04/2018.  BNP elevated at 125.9 in the setting of obesity.  Patient also reports weight gain of approximately 17 pounds over the last few months. -Strict I&Os and daily weights -Give furosemide 40 mg IV twice daily -Reassess in a.m. and determine if need of continuation of IV diuretic  COPD: No significant wheezing appreciated. -Continue breathing treatments scheduled  Atrial fibrillation on anticoagulation, s/p PM: Patient appears to be ventricularly paced. -Continue Eliquis  Essential hypertension: Stable. -Continue to monitor  Hyperlipidemia -Continue  atorvastatin  Chronic venous insufficiency -Lac-Hydrin lotion twice daily  OSA on's BiPAP: Patient has BiPAP at bedside. -Continue use at night  Morbid obesity: Body mass index is 48.74 kg/m.   Family Communication/Anticipated D/C date and plan/Code Status   DVT prophylaxis: Eliquis Code Status: Full Code.  Family Communication: No family present at bedside Disposition Plan: Possible discharge home in 1 to 2 days   Medical Consultants:    None.   Anti-Infectives:    None  Subjective:   Patient reports that he has had recent sick contacts of his wife and his son who he says or diagnosed with the flu.  After the holidays patient reports that his weight has been up approximately 17 pounds from the last time and a check.  Objective:    Vitals:   10/28/18 1500 10/28/18 1941 10/28/18 1952 10/29/18 0108  BP: (!) 121/57  128/82 113/74  Pulse: 85  89 85  Resp: 19  18 20   Temp: 98.7 F (37.1 C)  98.5 F (36.9 C) 97.8 F (36.6 C)  TempSrc: Oral  Oral Oral  SpO2: 96% 96% 93% 96%  Weight:      Height:        Intake/Output Summary (Last 24 hours) at 10/29/2018 0249 Last data filed at 10/29/2018 0203 Gross per 24 hour  Intake 960 ml  Output 750 ml  Net 210 ml   Filed Weights   10/27/18 1900 10/28/18 0257  Weight: (!) 171.5 kg (!) 172.2 kg    Exam: Constitutional: Morbidly obese male in NAD, calm, comfortable Eyes: PERRL, lids and conjunctivae normal ENMT: Mucous membranes are moist. Posterior  pharynx clear of any exudate or lesions.  Neck: normal, supple, no masses, no thyromegaly Respiratory: Decreased overall aeration no significant wheezes or crackles appreciated.  Patient currently on 4 L of nasal cannula oxygen with O2 sats as low as 88% with talking.  Patient sentences are complete. Cardiovascular: Regular rate and rhythm, no murmurs / rubs / gallops.  +1 lower extremity edema extremity edema. 2+ pedal pulses. No carotid bruits.  Abdomen: Protuberant  abdomen.  No tenderness, no masses palpated. No hepatosplenomegaly. Bowel sounds positive.  Musculoskeletal: no clubbing / cyanosis. No joint deformity upper and lower extremities. Good ROM, no contractures. Normal muscle tone.  Skin: Venous stasis of the bilateral lower extremities. Neurologic: CN 2-12 grossly intact. Sensation intact, DTR normal. Strength 5/5 in all 4.  Psychiatric: Normal judgment and insight. Alert and oriented x 3. Normal mood.    Data Reviewed:   I have personally reviewed following labs and imaging studies:  Labs: Labs show the following:   Basic Metabolic Panel: Recent Labs  Lab 10/27/18 1107 10/27/18 1601 10/28/18 0740  NA 141 142 143  K 4.2 6.0* 4.8  CL  --  102 101  CO2  --  32 31  GLUCOSE 100* 137* 115*  BUN  --  24* 22  CREATININE  --  1.20 1.28*  CALCIUM  --  9.0 8.8*   GFR Estimated Creatinine Clearance: 82.1 mL/min (A) (by C-G formula based on SCr of 1.28 mg/dL (H)). Liver Function Tests: No results for input(s): AST, ALT, ALKPHOS, BILITOT, PROT, ALBUMIN in the last 168 hours. No results for input(s): LIPASE, AMYLASE in the last 168 hours. No results for input(s): AMMONIA in the last 168 hours. Coagulation profile No results for input(s): INR, PROTIME in the last 168 hours.  CBC: Recent Labs  Lab 10/27/18 1107 10/27/18 1601 10/28/18 0740  WBC  --  7.2 8.6  HGB 12.2* 10.8* 10.7*  HCT 36.0* 36.9* 36.0*  MCV  --  98.7 99.2  PLT  --  128* 132*   Cardiac Enzymes: Recent Labs  Lab 10/28/18 0829  TROPONINI 0.04*   BNP (last 3 results) No results for input(s): PROBNP in the last 8760 hours. CBG: No results for input(s): GLUCAP in the last 168 hours. D-Dimer: No results for input(s): DDIMER in the last 72 hours. Hgb A1c: No results for input(s): HGBA1C in the last 72 hours. Lipid Profile: No results for input(s): CHOL, HDL, LDLCALC, TRIG, CHOLHDL, LDLDIRECT in the last 72 hours. Thyroid function studies: No results for  input(s): TSH, T4TOTAL, T3FREE, THYROIDAB in the last 72 hours.  Invalid input(s): FREET3 Anemia work up: No results for input(s): VITAMINB12, FOLATE, FERRITIN, TIBC, IRON, RETICCTPCT in the last 72 hours. Sepsis Labs: Recent Labs  Lab 10/27/18 1601 10/28/18 0740  WBC 7.2 8.6    Microbiology Recent Results (from the past 240 hour(s))  Respiratory Panel by PCR     Status: None   Collection Time: 10/27/18  2:54 PM  Result Value Ref Range Status   Adenovirus NOT DETECTED NOT DETECTED Final   Coronavirus 229E NOT DETECTED NOT DETECTED Final    Comment: (NOTE) The Coronavirus on the Respiratory Panel, DOES NOT test for the novel  Coronavirus (2019 nCoV)    Coronavirus HKU1 NOT DETECTED NOT DETECTED Final   Coronavirus NL63 NOT DETECTED NOT DETECTED Final   Coronavirus OC43 NOT DETECTED NOT DETECTED Final   Metapneumovirus NOT DETECTED NOT DETECTED Final   Rhinovirus / Enterovirus NOT DETECTED NOT DETECTED Final  Influenza A NOT DETECTED NOT DETECTED Final   Influenza B NOT DETECTED NOT DETECTED Final   Parainfluenza Virus 1 NOT DETECTED NOT DETECTED Final   Parainfluenza Virus 2 NOT DETECTED NOT DETECTED Final   Parainfluenza Virus 3 NOT DETECTED NOT DETECTED Final   Parainfluenza Virus 4 NOT DETECTED NOT DETECTED Final   Respiratory Syncytial Virus NOT DETECTED NOT DETECTED Final   Bordetella pertussis NOT DETECTED NOT DETECTED Final   Chlamydophila pneumoniae NOT DETECTED NOT DETECTED Final   Mycoplasma pneumoniae NOT DETECTED NOT DETECTED Final    Comment: Performed at [facility], [place] [address], [place], [place] [address]    Procedures and diagnostic studies:  Dg Chest 2 View  Result Date: 10/27/2018 CLINICAL DATA:  Dyspnea and shortness of breath for the past 2 days. EXAM: CHEST - 2 VIEW COMPARISON:  10/06/2018. FINDINGS: Stable enlarged cardiac silhouette and left subclavian pacemaker leads. The lungs remain clear with normal vascularity. Right shoulder  degenerative changes and thoracic spine degenerative changes. IMPRESSION: No acute abnormality. Stable cardiomegaly. Electronically Signed   By: Claudie Revering M.D.   On: 10/27/2018 14:14    Medications:   . acetaminophen  1,000 mg Oral BID  . apixaban  5 mg Oral BID  . aspirin EC  81 mg Oral Daily  . atorvastatin  60 mg Oral Daily  . cholecalciferol  5,000 Units Oral Daily  . [START ON 10/30/2018] ferrous [id number] C-folic acid  1 capsule Oral Once per day on Mon Thu  . furosemide  40 mg Intravenous BID  . gabapentin  300 mg Oral TID  . ipratropium-albuterol  3 mL Nebulization TID  . multivitamin with minerals  1 tablet Oral Daily  . polyethylene glycol  17 g Oral Daily  . senna-docusate  1 tablet Oral BID  . sodium chloride flush  3 mL Intravenous Q12H  . vitamin C  500 mg Oral Daily   Continuous Infusions:   LOS: 1 day   Rondell A Smith  [facility]   *Please refer to [email or web address], password [email or web address] to get updated schedule on who will round on this patient, as hospitalists switch teams weekly. If 7PM-7AM, please contact night-coverage at [email or web address], password [email or web address] for any overnight needs.

## 2018-10-30 LAB — BASIC METABOLIC PANEL
Anion gap: 11 (ref 5–15)
BUN: 27 mg/dL — ABNORMAL HIGH (ref 8–23)
CO2: 33 mmol/L — ABNORMAL HIGH (ref 22–32)
Calcium: 8.9 mg/dL (ref 8.9–10.3)
Chloride: 96 mmol/L — ABNORMAL LOW (ref 98–111)
Creatinine, Ser: 1.23 mg/dL (ref 0.61–1.24)
GFR calc Af Amer: >60 mL/min (ref >60)
GFR calc non Af Amer: 57 mL/min — ABNORMAL LOW (ref >60)
Glucose, Bld: 104 mg/dL — ABNORMAL HIGH (ref 70–99)
Potassium: 4.3 mmol/L (ref 3.5–5.1)
Sodium: 140 mmol/L (ref 135–145)

## 2018-10-30 LAB — CBC
HCT: 33.8 % — ABNORMAL LOW (ref 39.0–52.0)
Hemoglobin: 10.4 g/dL — ABNORMAL LOW (ref 13.0–17.0)
MCH: 30.6 pg (ref 26.0–34.0)
MCHC: 30.8 g/dL (ref 30.0–36.0)
MCV: 99.4 fL (ref 80.0–100.0)
Platelets: 125 10*3/uL — ABNORMAL LOW (ref 150–400)
RBC: 3.4 MIL/uL — ABNORMAL LOW (ref 4.22–5.81)
RDW: 18 % — ABNORMAL HIGH (ref 11.5–15.5)
WBC: 7 10*3/uL (ref 4.0–10.5)
nRBC: 0 % (ref 0.0–0.2)

## 2018-10-30 MED ORDER — GERHARDT'S BUTT CREAM
TOPICAL_CREAM | CUTANEOUS | Status: DC | PRN
  Administered 2018-10-31: 1 via TOPICAL
  Administered 2018-11-01: 11:00:00 via TOPICAL
  Filled 2018-10-30: qty 1

## 2018-10-30 MED ORDER — FUROSEMIDE 10 MG/ML IJ SOLN
40.0000 mg | Freq: Three times a day (TID) | INTRAMUSCULAR | Status: DC
  Administered 2018-10-30 – 2018-11-02 (×10): 40 mg via INTRAVENOUS
  Filled 2018-10-30 (×11): qty 4

## 2018-10-30 MED ORDER — HYDROCERIN EX CREA
TOPICAL_CREAM | Freq: Two times a day (BID) | CUTANEOUS | Status: DC | PRN
  Administered 2018-11-01: 1 via TOPICAL
  Administered 2018-11-02 – 2018-11-04 (×2): via TOPICAL
  Filled 2018-10-30: qty 113

## 2018-10-30 MED ORDER — HYDROCERIN EX CREA
TOPICAL_CREAM | Freq: Two times a day (BID) | CUTANEOUS | Status: DC
  Filled 2018-10-30: qty 113

## 2018-10-30 NOTE — Progress Notes (Signed)
Progress Note    [name]  OQH:[id number] DOB: 12-20-1940  DOA: 10/27/2018 PCP: [name], MD    Brief Narrative:   Chief complaint: Shortness of breath  Medical records reviewed and are as summarized below:  [name] is an 78 y.o. male with past medical history significant for HTN, HLD, s/p TAVR, s/p PM, chronic diastolic CHF, paroxysmal atrial fibrillation, morbid obesity, DDD, and OSA on BiPAP; who had undergone cardioversion with Dr. Harrington Challenger and was noted to remain hypoxic.  Normally patient only requires oxygen at night. -Of note he weighed 157 kg ( 346 pounds ) on 09/25/2017, at the next cardiology visit on 1/17 he weighed 169 kg, and on admission he weighed 171 kg (378 lbs), which equates to over 30 pound weight gain, I strongly suspect most of this is fluid overload  Assessment/Plan:   Acute respiratory failure with hypoxia:  -I suspect this is multifactorial but predominantly driven by fluid overload  -In the background of COPD, OSA/OHS  -IV lasix as below, improving slowly -at baseline only uses O2 at night, suspect he will need this continuously -Continue CPAP nightly  Acute on chronic diastolic CHF -Echo 5/465 9 with EF of 60% -Significant 30 pound( 14 kg) weight gain over 1 month -He takes 20 mg of Lasix twice daily -Continue IV Lasix, increased dose to 40 mg every 8, urine output grossly inaccurate, discussed with staff  Morbid obesity -BMI is 48 -Discussed importance for diet/lifestyle modification  COPD:  -No significant wheezing appreciated. -Continue breathing treatments scheduled  Atrial flutter/fibrillation with rapid ventricular response  -Is post permanent pacemaker, he was cardioverted to sinus rhythm 2/7 -Currently appears V paced, continue Eliquis  History of severe AS -status post TAVR in 2016 and PPM for AV block  Essential hypertension: Stable. -Continue to monitor  Hyperlipidemia -Continue atorvastatin  Chronic venous  insufficiency -Lac-Hydrin lotion twice daily  OSA on's BiPAP: Patient has BiPAP at bedside. -Continue use at night  Morbid obesity: Body mass index is 48.74 kg/m.   Family Communication/Anticipated D/C date and plan/Code Status   DVT prophylaxis: Eliquis Code Status: Full Code.  Family Communication: No family present at bedside Disposition Plan: Home in few days pending adequate diuresis   Medical Consultants:    None.   Anti-Infectives:    None  Subjective:   -Reports progressive shortness of breath for over a week prior to admission  Objective:    Vitals:   10/29/18 1943 10/29/18 1949 10/30/18 0555 10/30/18 0753  BP: 121/72  115/70   Pulse: 89  68   Resp: 20  20   Temp: (!) 97.4 F (36.3 C)  (!) 97.5 F (36.4 C)   TempSrc: Oral  Oral   SpO2: 95% 95% 93% 90%  Weight:   (!) 169.8 kg   Height:        Intake/Output Summary (Last 24 hours) at 10/30/2018 1356 Last data filed at 10/30/2018 1300 Gross per 24 hour  Intake 600 ml  Output 1000 ml  Net -400 ml   Filed Weights   10/28/18 0257 10/29/18 0511 10/30/18 0555  Weight: (!) 172.2 kg (!) 171.5 kg (!) 169.8 kg    Exam Gen: Awake, Alert, Oriented X 3, sitting up in bed, no distress HEENT: PERRLA, Neck supple, no JVD Lungs: Improved air movement, decreased breath sounds at both bases CVS: RRR,No Gallops,Rubs or new Murmurs Abd: soft, Non tender, non distended, BS present Extremities: 2+ edema  Data Reviewed:  I have personally reviewed following labs and imaging studies:  Labs: Labs show the following:   Basic Metabolic Panel: Recent Labs  Lab 10/27/18 1107 10/27/18 1601 10/28/18 0740 10/29/18 0512 10/30/18 0612  NA 141 142 143 142 140  K 4.2 6.0* 4.8 4.3 4.3  CL  --  102 101 99 96*  CO2  --  32 31 31 33*  GLUCOSE 100* 137* 115* 96 104*  BUN  --  24* 22 27* 27*  CREATININE  --  1.20 1.28* 1.30* 1.23  CALCIUM  --  9.0 8.8* 8.8* 8.9   GFR Estimated Creatinine Clearance: 84.7  mL/min (by C-G formula based on SCr of 1.23 mg/dL). Liver Function Tests: No results for input(s): AST, ALT, ALKPHOS, BILITOT, PROT, ALBUMIN in the last 168 hours. No results for input(s): LIPASE, AMYLASE in the last 168 hours. No results for input(s): AMMONIA in the last 168 hours. Coagulation profile No results for input(s): INR, PROTIME in the last 168 hours.  CBC: Recent Labs  Lab 10/27/18 1107 10/27/18 1601 10/28/18 0740 10/30/18 0612  WBC  --  7.2 8.6 7.0  HGB 12.2* 10.8* 10.7* 10.4*  HCT 36.0* 36.9* 36.0* 33.8*  MCV  --  98.7 99.2 99.4  PLT  --  128* 132* 125*   Cardiac Enzymes: Recent Labs  Lab 10/28/18 0829 10/29/18 0512  TROPONINI 0.04* 0.05*   BNP (last 3 results) No results for input(s): PROBNP in the last 8760 hours. CBG: No results for input(s): GLUCAP in the last 168 hours. D-Dimer: No results for input(s): DDIMER in the last 72 hours. Hgb A1c: No results for input(s): HGBA1C in the last 72 hours. Lipid Profile: No results for input(s): CHOL, HDL, LDLCALC, TRIG, CHOLHDL, LDLDIRECT in the last 72 hours. Thyroid function studies: No results for input(s): TSH, T4TOTAL, T3FREE, THYROIDAB in the last 72 hours.  Invalid input(s): FREET3 Anemia work up: No results for input(s): VITAMINB12, FOLATE, FERRITIN, TIBC, IRON, RETICCTPCT in the last 72 hours. Sepsis Labs: Recent Labs  Lab 10/27/18 1601 10/28/18 0740 10/30/18 0612  WBC 7.2 8.6 7.0    Microbiology Recent Results (from the past 240 hour(s))  Respiratory Panel by PCR     Status: None   Collection Time: 10/27/18  2:54 PM  Result Value Ref Range Status   Adenovirus NOT DETECTED NOT DETECTED Final   Coronavirus 229E NOT DETECTED NOT DETECTED Final    Comment: (NOTE) The Coronavirus on the Respiratory Panel, DOES NOT test for the novel  Coronavirus (2019 nCoV)    Coronavirus HKU1 NOT DETECTED NOT DETECTED Final   Coronavirus NL63 NOT DETECTED NOT DETECTED Final   Coronavirus OC43 NOT DETECTED  NOT DETECTED Final   Metapneumovirus NOT DETECTED NOT DETECTED Final   Rhinovirus / Enterovirus NOT DETECTED NOT DETECTED Final   Influenza A NOT DETECTED NOT DETECTED Final   Influenza B NOT DETECTED NOT DETECTED Final   Parainfluenza Virus 1 NOT DETECTED NOT DETECTED Final   Parainfluenza Virus 2 NOT DETECTED NOT DETECTED Final   Parainfluenza Virus 3 NOT DETECTED NOT DETECTED Final   Parainfluenza Virus 4 NOT DETECTED NOT DETECTED Final   Respiratory Syncytial Virus NOT DETECTED NOT DETECTED Final   Bordetella pertussis NOT DETECTED NOT DETECTED Final   Chlamydophila pneumoniae NOT DETECTED NOT DETECTED Final   Mycoplasma pneumoniae NOT DETECTED NOT DETECTED Final    Comment: Performed at [facility], [address] [place], [place] [address]    Procedures and diagnostic studies:  No  results found.  Medications:   . acetaminophen  1,000 mg Oral BID  . apixaban  5 mg Oral BID  . aspirin EC  81 mg Oral Daily  . atorvastatin  60 mg Oral Daily  . cholecalciferol  5,000 Units Oral Daily  . ferrous [id number] C-folic acid  1 capsule Oral Once per day on Mon Thu  . furosemide  40 mg Intravenous Q8H  . gabapentin  300 mg Oral TID  . ipratropium-albuterol  3 mL Nebulization TID  . multivitamin with minerals  1 tablet Oral Daily  . polyethylene glycol  17 g Oral Daily  . senna-docusate  1 tablet Oral BID  . sodium chloride flush  3 mL Intravenous Q12H  . vitamin C  500 mg Oral Daily   Continuous Infusions:   LOS: 2 days   [facility]

## 2018-10-30 NOTE — Plan of Care (Signed)

## 2018-10-30 NOTE — Anesthesia Postprocedure Evaluation (Signed)
Anesthesia Post Note  Patient: [name]  Procedure(s) Performed: CARDIOVERSION (N/A )     Patient location during evaluation: PACU Anesthesia Type: General Level of consciousness: awake and alert Pain management: pain level controlled Vital Signs Assessment: post-procedure vital signs reviewed and stable Respiratory status: spontaneous breathing, nonlabored ventilation, respiratory function stable and patient connected to nasal cannula oxygen Cardiovascular status: blood pressure returned to baseline and stable Postop Assessment: no apparent nausea or vomiting Anesthetic complications: no    Last Vitals:  Vitals:   10/30/18 1944 10/30/18 2004  BP:  (!) 150/66  Pulse:  87  Resp: 20 18  Temp:  (!) 36.1 C  SpO2: 94% 97%    Last Pain:  Vitals:   10/30/18 1600  TempSrc:   PainSc: 0-No pain                 Ryan P Ellender

## 2018-10-30 NOTE — Progress Notes (Signed)
Nutrition Education Note  RD consulted for nutrition education regarding new onset CHF.  RD provided "Low Sodium Nutrition Therapy" handout from the Academy of Nutrition and Dietetics. Reviewed patient's dietary recall. Provided examples on ways to decrease sodium intake in diet. Discouraged intake of processed foods and use of salt shaker. Encouraged fresh fruits and vegetables as well as whole grain sources of carbohydrates to maximize fiber intake.   RD discussed why it is important for patient to adhere to diet recommendations, and emphasized the role of fluids, foods to avoid, and importance of weighing self daily. Teach back method used.  Expect good compliance.  Body mass index is 48.06 kg/m. Pt meets criteria for morbid obesity based on current BMI.  Current diet order is Heart, patient is consuming approximately 100% of meals at this time. Labs and medications reviewed. No further nutrition interventions warranted at this time. RD contact information provided. If additional nutrition issues arise, please re-consult RD.   Althea Grimmer, MS, RDN, LDN Pager: [phone] Available Mondays and Fridays, 9am-2pm

## 2018-10-30 NOTE — Discharge Instructions (Signed)

## 2018-10-30 NOTE — Plan of Care (Addendum)
  Problem: Food- and Nutrition-Related Knowledge Deficit (NB-1.1) Goal: Nutrition education Description Formal process to instruct or train a patient/client in a skill or to impart knowledge to help patients/clients voluntarily manage or modify food choices and eating behavior to maintain or improve health. Outcome: Completed/Met   Santiago Graf, MS, RDN, LDN Pager: [phone] Available Mondays and Fridays, 9am-2pm  

## 2018-10-31 ENCOUNTER — Encounter (HOSPITAL_COMMUNITY): Payer: Self-pay | Admitting: Internal Medicine

## 2018-10-31 LAB — BASIC METABOLIC PANEL
Anion gap: 14 (ref 5–15)
BUN: 29 mg/dL — ABNORMAL HIGH (ref 8–23)
CO2: 30 mmol/L (ref 22–32)
Calcium: 9.3 mg/dL (ref 8.9–10.3)
Chloride: 98 mmol/L (ref 98–111)
Creatinine, Ser: 1.25 mg/dL — ABNORMAL HIGH (ref 0.61–1.24)
GFR calc Af Amer: >60 mL/min (ref >60)
GFR calc non Af Amer: 56 mL/min — ABNORMAL LOW (ref >60)
Glucose, Bld: 107 mg/dL — ABNORMAL HIGH (ref 70–99)
Potassium: 4.5 mmol/L (ref 3.5–5.1)
Sodium: 142 mmol/L (ref 135–145)

## 2018-10-31 LAB — CBC
HCT: 38.3 % — ABNORMAL LOW (ref 39.0–52.0)
Hemoglobin: 11.4 g/dL — ABNORMAL LOW (ref 13.0–17.0)
MCH: 29.5 pg (ref 26.0–34.0)
MCHC: 29.8 g/dL — ABNORMAL LOW (ref 30.0–36.0)
MCV: 99.2 fL (ref 80.0–100.0)
Platelets: 151 10*3/uL (ref 150–400)
RBC: 3.86 MIL/uL — ABNORMAL LOW (ref 4.22–5.81)
RDW: 17.9 % — ABNORMAL HIGH (ref 11.5–15.5)
WBC: 7.7 10*3/uL (ref 4.0–10.5)
nRBC: 0 % (ref 0.0–0.2)

## 2018-10-31 NOTE — Progress Notes (Signed)
Physical Therapy Treatment Patient Details Name: [name] MRN: [id number] DOB: 09-04-1941 Today's Date: 10/31/2018    History of Present Illness Patient is a 78 y/o male who presents with hypoxia s/p cardioversion for A flutter. PMH includes pacemaker, COPD, morbid obesity, OSA on CPAP, HTN, s/p TAVR.     PT Comments    Pt is up to stand with no O2 on initially, O2 sats dropped to 76% and then steadied at 80%.  Recovered with O2 and did drop with Pt standing and controlling walker.  Follow up with gait as tolerated, will progress him as he can tolerate.     Follow Up Recommendations  Home health PT;Supervision for mobility/OOB     Equipment Recommendations  None recommended by PT    Recommendations for Other Services       Precautions / Restrictions Precautions Precautions: Fall Restrictions Weight Bearing Restrictions: No    Mobility  Bed Mobility Overal bed mobility: Needs Assistance Bed Mobility: Supine to Sit;Sit to Supine     Supine to sit: Mod assist;+2 for physical assistance;+2 for safety/equipment Sit to supine: Mod assist;+2 for physical assistance;+2 for safety/equipment      Transfers Overall transfer level: Needs assistance Equipment used: Rolling walker (2 wheeled) Transfers: Sit to/from [facility] Sit to Stand: Mod assist Stand pivot transfers: Min assist       General transfer comment: mod with bed elevated to trust  Ambulation/Gait             General Gait Details: unable   Stairs             Wheelchair Mobility    Modified Rankin (Stroke Patients Only)       Balance Overall balance assessment: Needs assistance Sitting-balance support: Feet supported;No upper extremity supported Sitting balance-Leahy Scale: Good       Standing balance-Leahy Scale: Fair                              Cognition Arousal/Alertness: Awake/alert Behavior During Therapy: WFL for tasks  assessed/performed Overall Cognitive Status: Within Functional Limits for tasks assessed                                        Exercises      General Comments        Pertinent Vitals/Pain Pain Assessment: No/denies pain    Home Living                      Prior Function            PT Goals (current goals can now be found in the care plan section) Acute Rehab PT Goals Patient Stated Goal: home soon Progress towards PT goals: Progressing toward goals    Frequency    Min 3X/week      PT Plan Current plan remains appropriate    Co-evaluation              AM-PAC PT "6 Clicks" Mobility   Outcome Measure  Help needed turning from your back to your side while in a flat bed without using bedrails?: A Little Help needed moving from lying on your back to sitting on the side of a flat bed without using bedrails?: A Little Help needed moving to and from a bed to a chair (including a wheelchair)?:  A Little Help needed standing up from a chair using your arms (e.g., wheelchair or bedside chair)?: A Lot Help needed to walk in hospital room?: A Lot Help needed climbing 3-5 steps with a railing? : Total 6 Click Score: 14    End of Session Equipment Utilized During Treatment: Gait belt;Oxygen Activity Tolerance: Patient limited by fatigue;Treatment limited secondary to agitation Patient left: in bed;with call bell/phone within reach;with family/visitor present Nurse Communication: Mobility status PT Visit Diagnosis: Unsteadiness on feet (R26.81);Difficulty in walking, not elsewhere classified (R26.2)     Time: 1000-1027 PT Time Calculation (min) (ACUTE ONLY): 27 min  Charges:  $Gait Training: 8-22 mins $Therapeutic Exercise: 8-22 mins                    Ramond Dial 10/31/2018, 5:01 PM  Mee Hives, PT MS Acute Rehab Dept. Number: [place] [facility] [place]

## 2018-10-31 NOTE — Progress Notes (Signed)
Pt states he can not roll over for me to look at his back/bottom for a full skin assessment. Pt states "when I get in these beds I get locked in and there is no way youre moving me".

## 2018-10-31 NOTE — Care Management Important Message (Signed)
Important Message  Patient Details  Name: [name] MRN: [id number] Date of Birth: [birth year]   Medicare Important Message Given:  Yes    Barb Merino Meela Wareing 10/31/2018, 4:39 PM

## 2018-10-31 NOTE — Plan of Care (Signed)

## 2018-10-31 NOTE — Plan of Care (Signed)
  Problem: Activity: Goal: Risk for activity intolerance will decrease Outcome: Progressing   Problem: Nutrition: Goal: Adequate nutrition will be maintained Outcome: Progressing   Problem: Coping: Goal: Level of anxiety will decrease Outcome: Progressing   

## 2018-10-31 NOTE — Progress Notes (Signed)
Progress Note    [name]  [id number]:[id number] DOB: [birth year]  DOA: 10/27/2018 PCP: [name], MD    Brief Narrative:   Chief complaint: Shortness of breath  Medical records reviewed and are as summarized below:  [name] is a 78 y.o. male with past medical history significant for HTN, HLD, s/p TAVR, s/p PM, chronic diastolic CHF, paroxysmal atrial fibrillation, morbid obesity, DDD, and OSA on BiPAP; who had undergone cardioversion with Dr. Harrington Challenger and was noted to remain hypoxic.  Normally patient only requires oxygen at night. -Of note he weighed 157 kg ( 346 pounds ) on 09/25/2017, at the next cardiology visit on 1/17 he weighed 169 kg, and on admission he weighed 171 kg (378 lbs), which equates to over 30 pound weight gain, I strongly suspect most of this is fluid overload  Assessment/Plan:   Acute respiratory failure with hypoxia:  -I suspect this is multifactorial but predominantly driven by fluid overload  -In the background of COPD, OSA/OHS  -Improving with diuresis, continue IV Lasix 40 mg 3 times daily, weight is down 9 pounds -at baseline only uses O2 at night, suspect he will need this continuously -Continue CPAP nightly  Acute on chronic diastolic CHF -Echo 0/272 9 with EF of 60% -Significant 30 pound( 14 kg) weight gain over 1 month -He takes 20 mg of Lasix twice daily -Improving with IV Lasix, continue 40 mg IV 3 times daily, weight down by 9 pounds -Continue diuretics, need several more days of inpatient diuresis  Morbid obesity -BMI is 48 -Discussed importance for diet/lifestyle modification  COPD:  -No significant wheezing appreciated. -Continue breathing treatments scheduled  Atrial flutter/fibrillation with rapid ventricular response  -Is post permanent pacemaker, he was cardioverted to sinus rhythm 2/7 -Currently appears V paced, continue Eliquis  History of severe AS -status post TAVR in 2016 and PPM for AV block  Essential hypertension:  Stable. -Continue to monitor  Hyperlipidemia -Continue atorvastatin  Chronic venous insufficiency -Lac-Hydrin lotion twice daily  OSA on's BiPAP: Patient has BiPAP at bedside. -Continue use at night  Morbid obesity: Body mass index is 48.74 kg/m.   Family Communication/Anticipated D/C date and plan/Code Status   DVT prophylaxis: Eliquis Code Status: Full Code.  Family Communication: No family present at bedside Disposition Plan: Home in few days pending adequate diuresis   Medical Consultants:    None.   Anti-Infectives:    None  Subjective:   -Feels better, breathing improving, less tired  Objective:    Vitals:   10/30/18 1944 10/30/18 2004 10/31/18 0546 10/31/18 0746  BP:  (!) 150/66 (!) 153/74   Pulse:  87 88   Resp: 20 18 18    Temp:  (!) 97 F (36.1 C) 98 F (36.7 C)   TempSrc:   Oral   SpO2: 94% 97% 98% 99%  Weight:   (!) 168.2 kg   Height:        Intake/Output Summary (Last 24 hours) at 10/31/2018 1346 Last data filed at 10/31/2018 1000 Gross per 24 hour  Intake 840 ml  Output 2275 ml  Net -1435 ml   Filed Weights   10/29/18 0511 10/30/18 0555 10/31/18 0546  Weight: (!) 171.5 kg (!) 169.8 kg (!) 168.2 kg    Exam Gen: Awake, Alert, Oriented X 3, sitting up in bed, no distress HEENT: PERRLA, Neck supple, no JVD Lungs: Improving air movement, rare basilar crackles CVS: RRR,No Gallops,Rubs or new Murmurs Abd: soft, Non tender, non distended,  BS present, mild abdominal wall edema Extremities: 2+ edema Skin: no new rashes  Data Reviewed:   I have personally reviewed following labs and imaging studies:  Labs: Labs show the following:   Basic Metabolic Panel: Recent Labs  Lab 10/27/18 1601 10/28/18 0740 10/29/18 0512 10/30/18 0612 10/31/18 0510  NA 142 143 142 140 142  K 6.0* 4.8 4.3 4.3 4.5  CL 102 101 99 96* 98  CO2 32 31 31 33* 30  GLUCOSE 137* 115* 96 104* 107*  BUN 24* 22 27* 27* 29*  CREATININE 1.20 1.28* 1.30* 1.23  1.25*  CALCIUM 9.0 8.8* 8.8* 8.9 9.3   GFR Estimated Creatinine Clearance: 82.9 mL/min (A) (by C-G formula based on SCr of 1.25 mg/dL (H)). Liver Function Tests: No results for input(s): AST, ALT, ALKPHOS, BILITOT, PROT, ALBUMIN in the last 168 hours. No results for input(s): LIPASE, AMYLASE in the last 168 hours. No results for input(s): AMMONIA in the last 168 hours. Coagulation profile No results for input(s): INR, PROTIME in the last 168 hours.  CBC: Recent Labs  Lab 10/27/18 1107 10/27/18 1601 10/28/18 0740 10/30/18 0612 10/31/18 0510  WBC  --  7.2 8.6 7.0 7.7  HGB 12.2* 10.8* 10.7* 10.4* 11.4*  HCT 36.0* 36.9* 36.0* 33.8* 38.3*  MCV  --  98.7 99.2 99.4 99.2  PLT  --  128* 132* 125* 151   Cardiac Enzymes: Recent Labs  Lab 10/28/18 0829 10/29/18 0512  TROPONINI 0.04* 0.05*   BNP (last 3 results) No results for input(s): PROBNP in the last 8760 hours. CBG: No results for input(s): GLUCAP in the last 168 hours. D-Dimer: No results for input(s): DDIMER in the last 72 hours. Hgb A1c: No results for input(s): HGBA1C in the last 72 hours. Lipid Profile: No results for input(s): CHOL, HDL, LDLCALC, TRIG, CHOLHDL, LDLDIRECT in the last 72 hours. Thyroid function studies: No results for input(s): TSH, T4TOTAL, T3FREE, THYROIDAB in the last 72 hours.  Invalid input(s): FREET3 Anemia work up: No results for input(s): VITAMINB12, FOLATE, FERRITIN, TIBC, IRON, RETICCTPCT in the last 72 hours. Sepsis Labs: Recent Labs  Lab 10/27/18 1601 10/28/18 0740 10/30/18 0612 10/31/18 0510  WBC 7.2 8.6 7.0 7.7    Microbiology Recent Results (from the past 240 hour(s))  Respiratory Panel by PCR     Status: None   Collection Time: 10/27/18  2:54 PM  Result Value Ref Range Status   Adenovirus NOT DETECTED NOT DETECTED Final   Coronavirus 229E NOT DETECTED NOT DETECTED Final    Comment: (NOTE) The Coronavirus on the Respiratory Panel, DOES NOT test for the novel  Coronavirus  (2019 nCoV)    Coronavirus HKU1 NOT DETECTED NOT DETECTED Final   Coronavirus NL63 NOT DETECTED NOT DETECTED Final   Coronavirus OC43 NOT DETECTED NOT DETECTED Final   Metapneumovirus NOT DETECTED NOT DETECTED Final   Rhinovirus / Enterovirus NOT DETECTED NOT DETECTED Final   Influenza A NOT DETECTED NOT DETECTED Final   Influenza B NOT DETECTED NOT DETECTED Final   Parainfluenza Virus 1 NOT DETECTED NOT DETECTED Final   Parainfluenza Virus 2 NOT DETECTED NOT DETECTED Final   Parainfluenza Virus 3 NOT DETECTED NOT DETECTED Final   Parainfluenza Virus 4 NOT DETECTED NOT DETECTED Final   Respiratory Syncytial Virus NOT DETECTED NOT DETECTED Final   Bordetella pertussis NOT DETECTED NOT DETECTED Final   Chlamydophila pneumoniae NOT DETECTED NOT DETECTED Final   Mycoplasma pneumoniae NOT DETECTED NOT DETECTED Final    Comment: Performed at  [facility], [place] [address] [place], [place] [address]    Procedures and diagnostic studies:  No results found.  Medications:   . acetaminophen  1,000 mg Oral BID  . apixaban  5 mg Oral BID  . aspirin EC  81 mg Oral Daily  . atorvastatin  60 mg Oral Daily  . cholecalciferol  5,000 Units Oral Daily  . ferrous [id number] C-folic acid  1 capsule Oral Once per day on Mon Thu  . furosemide  40 mg Intravenous Q8H  . gabapentin  300 mg Oral TID  . ipratropium-albuterol  3 mL Nebulization TID  . multivitamin with minerals  1 tablet Oral Daily  . polyethylene glycol  17 g Oral Daily  . senna-docusate  1 tablet Oral BID  . sodium chloride flush  3 mL Intravenous Q12H  . vitamin C  500 mg Oral Daily   Continuous Infusions:   LOS: 3 days   [facility]

## 2018-10-31 NOTE — Care Management Note (Signed)
Case Management Note  Patient Details  Name: [name] MRN: [id number] Date of Birth: [birth year]  Subjective/Objective: Hypoxia                  Action/Plan: Patient lives at home with his spouse; PCP: [name], MD; has private insurance with [employer]; pharmacy of choice is [facility] in [place]; DME - home oxygen through [place] and walker at home; [facility] choices offered, pt requested [facility]; Tiffany with [facility] called for arrangements; CM will continue to follow for progression of care.  Expected Discharge Date:    possibly 11/04/2018              Expected Discharge Plan:  [place]  In-House Referral:   [facility]  Discharge planning Services  CM Consult  Choice offered to:  Patient  HH Arranged:  PT [place] Agency:  [facility]  Status of Service:  In process, will continue to follow  Sherrilyn Rist [phone] 10/31/2018, 10:51 AM

## 2018-10-31 NOTE — Progress Notes (Signed)
RT asked pt if he needed help being placed on cpap for the night, pt stated his family member could help him before they left. RT will continue to monitor as needed.

## 2018-11-01 LAB — BASIC METABOLIC PANEL
Anion gap: 11 (ref 5–15)
BUN: 32 mg/dL — ABNORMAL HIGH (ref 8–23)
CO2: 37 mmol/L — ABNORMAL HIGH (ref 22–32)
Calcium: 8.8 mg/dL — ABNORMAL LOW (ref 8.9–10.3)
Chloride: 92 mmol/L — ABNORMAL LOW (ref 98–111)
Creatinine, Ser: 1.34 mg/dL — ABNORMAL HIGH (ref 0.61–1.24)
GFR calc Af Amer: 59 mL/min — ABNORMAL LOW (ref >60)
GFR calc non Af Amer: 51 mL/min — ABNORMAL LOW (ref >60)
Glucose, Bld: 102 mg/dL — ABNORMAL HIGH (ref 70–99)
Potassium: 3.6 mmol/L (ref 3.5–5.1)
Sodium: 140 mmol/L (ref 135–145)

## 2018-11-01 MED ORDER — IPRATROPIUM-ALBUTEROL 0.5-2.5 (3) MG/3ML IN SOLN
3.0000 mL | Freq: Two times a day (BID) | RESPIRATORY_TRACT | Status: DC
  Administered 2018-11-02 – 2018-11-04 (×5): 3 mL via RESPIRATORY_TRACT
  Filled 2018-11-01 (×6): qty 3

## 2018-11-01 NOTE — Plan of Care (Signed)

## 2018-11-01 NOTE — Consult Note (Signed)
[facility] Primary Care Navigator  11/01/2018  [name] Oct 31, 1940 [id number]   Met withpatientand wife [name]) at the bedsideto identify possible discharge needs.  Per MD note, patient remains hypoxic even after his cardioversion, was predominantly driven by fluid overload and was noted with 30 pounds weight gain over a month which all resulted to this admission. (acute respiratory failure with hypoxia, acute on chronic diastolic congestive HF, morbid obesity, atrial flutter/ atrial fibrillation).  PatientendorsesDr. Penni Homans with [place] [facility] as his primarycare provider.   [place] toobtain medications without any problem.   Patient reports that he has been managing his medications at [facility] use of "pillbox" system filled every week.  Patientverbalized thathehasbeen drivingprior to admission but his wife or sons will be able toprovide transportation tohisdoctors' appointmentsif needed after discharge.  Patient states thathe lives with wife who will serve as his primary caregiverat home. He mentioned that son [name]) lives with them who can also provide assistance if needed.  Anticipated discharge planishome with home health services per patient.   Patientvoiced understanding to call primary care provider's office whenhereturns home for a post discharge follow-up appointment within1- 2 weeksor sooner if needs arise.Patient letter (with PCP's contact number) was provided asareminder. Wife mentioned that patient has a scheduled appointment with primary care provider on 11/16/18 and knows to call the office after discharge to reschedule his follow-up appointment if needed.  Patient is a57 male with history of HTN, HLD, status post TAVR- transcatheter aortic valve replacement, status post pacemaker placement, chronic diastolic congestive HF, paroxysmal atrial  fibrillation, morbid obesity, DDD- degenerative disc disease, OSA- obstructive sleep apnea uses CPAP, had undergone cardioversion, COPD, chronic venous insufficiency.    Per chart review, patient is being diuresed with Lasix. He takes 20 mg of Lasix twice daily at home. He may need several more days of intravenous diuresis as inpatient with monitoring of blood pressure and kidney function closely.  He reports having a weighing scale at home but states that gait is not steady and admitsnot regularly monitoring weightnorrecording.  Patientmentionedhaving blood pressure cuff to check blood pressures butwould needtoregularly checkblood pressure and record.Patient needs further instructions and reminders on diet restrictions/ lifestyle modifications. He acknowledges the need foreducation, reinforcements with adherence/ compliance on ways to manage chronic health conditions. Patienthas minimal awareness ofthesigns and symptoms ofHF/ COPDthatwill need medical assistance. Discussed COPD action plan with teachback method. Patient is not fully aware ofHF/ COPD zones/ tool and ways to manage chronic medical conditions.  Explained to patientand wife about[facility] care management services andresources available forhim and hehad expressed interest aboutit.   Patient hadverbally agreed and opted for referralto [facility] care managementcoordinator for assessment and follow-up inmanaging congestive HF/ COPD;reinforce adherence to medical management and treatment interventions (i.e: diet,medications, weighing daily and recording) for further management of HFand otherchronic health conditions at home.  Referral made to [facility] care coordinator to assess home needs and for complex disease management of HF and otherchronic health conditions after discharge.  THNcare managementinformation was provided for future needs thathemay have.  Primary care provider's office is listed  as providing transition of care.    For additional questions please contact:  Edwena Felty A. Dairon Procter, BSN, RN-BC [facility] PRIMARY CARE Navigator Cell: [phone]

## 2018-11-01 NOTE — Progress Notes (Signed)
Pt was able to roll over on the bed for me to take a quick look at back/bottom. Skin assessment complete

## 2018-11-01 NOTE — Progress Notes (Signed)
Progress Note    [name]  QQV:[id number] DOB: 1941/04/26  DOA: 10/27/2018 PCP: [name], MD    Brief Narrative:   Chief complaint: Shortness of breath  Medical records reviewed and are as summarized below:  [name] is an 78 y.o. male with past medical history significant for HTN, HLD, s/p TAVR, s/p PM, chronic diastolic CHF, paroxysmal atrial fibrillation, morbid obesity, DDD, and OSA on BiPAP; who had undergone cardioversion by Dr. Harrington Challenger 2/7 and was noted to remain hypoxic, currently admission requested per [facility].  Normally patient only requires oxygen at night. -Of note he weighed 157 kg ( 346 pounds ) on 09/25/2017, at the next cardiology visit on 1/17 he weighed 169 kg, and on admission he weighed 171 kg (378 lbs), which equates to over 30 pound weight gain. -Started on Lasix for acute on chronic diastolic CHF, improving with diuresis  Assessment/Plan:   Acute respiratory failure with hypoxia:  -I suspect this is multifactorial but predominantly driven by fluid overload  -In the background of COPD, OSA/OHS  -Continues to improve nicely with IV Lasix, continue 40 mg 3 times daily, weight was down 9 pounds yesterday, suspect slight bump in weight this morning is inaccurate , negative over 2 L yesterday  -Attempt to wean O2 during the daytime, at baseline uses O2 nightly with CPAP only   Acute on chronic diastolic CHF -Echo 3/329 9 with EF of 60% -Significant 30 pound( 14 kg) weight gain over 1 month -He takes 20 mg of Lasix twice daily -Improving with IV Lasix, continue 40 mg IV 3 times daily, weight down by 9 pounds -Above, continue diuretics, I suspect he needs several more days of intravenous diuresis inpatient, monitor blood pressure and kidney function closely  Morbid obesity -BMI is 48 -Discussed importance for diet/lifestyle modification  COPD:  -No significant wheezing appreciated. -Continue breathing treatments scheduled  Atrial flutter/fibrillation  with rapid ventricular response  -Is post permanent pacemaker, he was cardioverted to sinus rhythm 2/7 -Currently appears V paced, continue Eliquis  History of severe AS -status post TAVR in 2016 and PPM for AV block  Essential hypertension:  -Stable, monitor  Hyperlipidemia -Continue atorvastatin  Chronic venous insufficiency -Lac-Hydrin lotion twice daily  OSA on BiPAP -Continue BiPAP QHS  Morbid obesity: Body mass index is 48.74 kg/m.   Family Communication/Anticipated D/C date and plan/Code Status   DVT prophylaxis: Eliquis Code Status: Full Code.  Family Communication: No family present at bedside, discussed with wife yesterday Disposition Plan: Home in few days pending adequate diuresis   Medical Consultants:    None.   Anti-Infectives:    None  Subjective:  -Continues to improve, breathing getting better, ambulating a little better -Unable to be weaned off O2 during the daytime at this time  Objective:    Vitals:   10/31/18 1935 11/01/18 0107 11/01/18 0405 11/01/18 0835  BP:   137/64   Pulse:   76   Resp:   18   Temp:   (!) 97.3 F (36.3 C)   TempSrc:   Oral   SpO2: 98%  96% 90%  Weight:  (!) 169.4 kg    Height:        Intake/Output Summary (Last 24 hours) at 11/01/2018 1137 Last data filed at 11/01/2018 1102 Gross per 24 hour  Intake 729 ml  Output 3277 ml  Net -2548 ml   Filed Weights   10/30/18 0555 10/31/18 0546 11/01/18 0107  Weight: (!) 169.8 kg [name])  168.2 kg (!) 169.4 kg    Exam Gen: Obese chronically ill-appearing male, sitting up in bed, pleasant, no distress, awake, Alert, Oriented X 3,  HEENT: PERRLA, Neck supple, no JVD Lungs: Distant breath sounds, improving air movement, decreased at bases CVS: S1-S2/regular rate rhythm Abd: soft, Non tender, non distended, BS present Extremities: 2+ edema, scaling and chronic venous stasis changes Skin: As above  Data Reviewed:   I have personally reviewed following labs and  imaging studies:  Labs: Labs show the following:   Basic Metabolic Panel: Recent Labs  Lab 10/28/18 0740 10/29/18 0512 10/30/18 0612 10/31/18 0510 11/01/18 0432  NA 143 142 140 142 140  K 4.8 4.3 4.3 4.5 3.6  CL 101 99 96* 98 92*  CO2 31 31 33* 30 37*  GLUCOSE 115* 96 104* 107* 102*  BUN 22 27* 27* 29* 32*  CREATININE 1.28* 1.30* 1.23 1.25* 1.34*  CALCIUM 8.8* 8.8* 8.9 9.3 8.8*   GFR Estimated Creatinine Clearance: 77.7 mL/min (A) (by C-G formula based on SCr of 1.34 mg/dL (H)). Liver Function Tests: No results for input(s): AST, ALT, ALKPHOS, BILITOT, PROT, ALBUMIN in the last 168 hours. No results for input(s): LIPASE, AMYLASE in the last 168 hours. No results for input(s): AMMONIA in the last 168 hours. Coagulation profile No results for input(s): INR, PROTIME in the last 168 hours.  CBC: Recent Labs  Lab 10/27/18 1107 10/27/18 1601 10/28/18 0740 10/30/18 0612 10/31/18 0510  WBC  --  7.2 8.6 7.0 7.7  HGB 12.2* 10.8* 10.7* 10.4* 11.4*  HCT 36.0* 36.9* 36.0* 33.8* 38.3*  MCV  --  98.7 99.2 99.4 99.2  PLT  --  128* 132* 125* 151   Cardiac Enzymes: Recent Labs  Lab 10/28/18 0829 10/29/18 0512  TROPONINI 0.04* 0.05*   BNP (last 3 results) No results for input(s): PROBNP in the last 8760 hours. CBG: No results for input(s): GLUCAP in the last 168 hours. D-Dimer: No results for input(s): DDIMER in the last 72 hours. Hgb A1c: No results for input(s): HGBA1C in the last 72 hours. Lipid Profile: No results for input(s): CHOL, HDL, LDLCALC, TRIG, CHOLHDL, LDLDIRECT in the last 72 hours. Thyroid function studies: No results for input(s): TSH, T4TOTAL, T3FREE, THYROIDAB in the last 72 hours.  Invalid input(s): FREET3 Anemia work up: No results for input(s): VITAMINB12, FOLATE, FERRITIN, TIBC, IRON, RETICCTPCT in the last 72 hours. Sepsis Labs: Recent Labs  Lab 10/27/18 1601 10/28/18 0740 10/30/18 0612 10/31/18 0510  WBC 7.2 8.6 7.0 7.7     Microbiology Recent Results (from the past 240 hour(s))  Respiratory Panel by PCR     Status: None   Collection Time: 10/27/18  2:54 PM  Result Value Ref Range Status   Adenovirus NOT DETECTED NOT DETECTED Final   Coronavirus 229E NOT DETECTED NOT DETECTED Final    Comment: (NOTE) The Coronavirus on the Respiratory Panel, DOES NOT test for the novel  Coronavirus (2019 nCoV)    Coronavirus HKU1 NOT DETECTED NOT DETECTED Final   Coronavirus NL63 NOT DETECTED NOT DETECTED Final   Coronavirus OC43 NOT DETECTED NOT DETECTED Final   Metapneumovirus NOT DETECTED NOT DETECTED Final   Rhinovirus / Enterovirus NOT DETECTED NOT DETECTED Final   Influenza A NOT DETECTED NOT DETECTED Final   Influenza B NOT DETECTED NOT DETECTED Final   Parainfluenza Virus 1 NOT DETECTED NOT DETECTED Final   Parainfluenza Virus 2 NOT DETECTED NOT DETECTED Final   Parainfluenza Virus 3 NOT DETECTED NOT DETECTED  Final   Parainfluenza Virus 4 NOT DETECTED NOT DETECTED Final   Respiratory Syncytial Virus NOT DETECTED NOT DETECTED Final   Bordetella pertussis NOT DETECTED NOT DETECTED Final   Chlamydophila pneumoniae NOT DETECTED NOT DETECTED Final   Mycoplasma pneumoniae NOT DETECTED NOT DETECTED Final    Comment: Performed at [facility], [place] [address], [place], [place] [address]    Procedures and diagnostic studies:  No results found.  Medications:   . acetaminophen  1,000 mg Oral BID  . apixaban  5 mg Oral BID  . aspirin EC  81 mg Oral Daily  . atorvastatin  60 mg Oral Daily  . cholecalciferol  5,000 Units Oral Daily  . ferrous [id number] C-folic acid  1 capsule Oral Once per day on Mon Thu  . furosemide  40 mg Intravenous Q8H  . gabapentin  300 mg Oral TID  . ipratropium-albuterol  3 mL Nebulization BID  . multivitamin with minerals  1 tablet Oral Daily  . polyethylene glycol  17 g Oral Daily  . senna-docusate  1 tablet Oral BID  . sodium chloride flush  3 mL Intravenous Q12H   . vitamin C  500 mg Oral Daily   Continuous Infusions:   LOS: 4 days   [facility]

## 2018-11-02 LAB — BASIC METABOLIC PANEL
Anion gap: 11 (ref 5–15)
BUN: 28 mg/dL — ABNORMAL HIGH (ref 8–23)
CO2: 40 mmol/L — ABNORMAL HIGH (ref 22–32)
Calcium: 8.8 mg/dL — ABNORMAL LOW (ref 8.9–10.3)
Chloride: 91 mmol/L — ABNORMAL LOW (ref 98–111)
Creatinine, Ser: 1.28 mg/dL — ABNORMAL HIGH (ref 0.61–1.24)
GFR calc Af Amer: >60 mL/min (ref >60)
GFR calc non Af Amer: 54 mL/min — ABNORMAL LOW (ref >60)
Glucose, Bld: 100 mg/dL — ABNORMAL HIGH (ref 70–99)
Potassium: 3.7 mmol/L (ref 3.5–5.1)
Sodium: 142 mmol/L (ref 135–145)

## 2018-11-02 MED ORDER — AMMONIUM LACTATE 12 % EX LOTN
TOPICAL_LOTION | Freq: Three times a day (TID) | CUTANEOUS | Status: DC
  Administered 2018-11-02 – 2018-11-04 (×5): via TOPICAL
  Filled 2018-11-02: qty 225

## 2018-11-02 MED ORDER — FUROSEMIDE 10 MG/ML IJ SOLN
40.0000 mg | Freq: Two times a day (BID) | INTRAMUSCULAR | Status: DC
  Administered 2018-11-02: 40 mg via INTRAVENOUS
  Filled 2018-11-02: qty 4

## 2018-11-02 NOTE — Progress Notes (Signed)
Pt has home equipment for use while in the hospital. RT offered to check H2O levels for pt and pt states he just filled it up. Pt states he can apply without difficulty. RT will continue to monitor.

## 2018-11-02 NOTE — Progress Notes (Signed)
Physical Therapy Treatment Patient Details Name: [name] MRN: [id number] DOB: 10-28-1940 Today's Date: 11/02/2018    History of Present Illness Patient is a 78 y/o male admitted 10/27/18 with hypoxia s/p cardioversion for aflutter. PMH includes pacemaker, COPD, morbid obesity, OSA on CPAP, HTN, s/p TAVR.    PT Comments    Pt progressing with mobility. Currently requiring minA for bed mobility; able to stand and ambulate with platform RW and min guard to supervision for safety. SpO2 down to 87% on 2L O2 [place], returning to >90% with seated rest. Increased time spent discussing home equipment and set-up; pt owns necessary DME and has appropriate assist from wife. Continue to recommend HHPT services.    Follow Up Recommendations  Home health PT;Supervision for mobility/OOB     Equipment Recommendations  None recommended by PT    Recommendations for Other Services       Precautions / Restrictions Precautions Precautions: Fall Precaution Comments: Bloody discharge with hemorrhoids Restrictions Weight Bearing Restrictions: No    Mobility  Bed Mobility Overal bed mobility: Needs Assistance Bed Mobility: Supine to Sit     Supine to sit: Min assist     General bed mobility comments: MinA for HHA to elevate trunk, pt able to maneuver BLEs well to side of bed and hook legs in order to shift weight; increased time and effort  Transfers Overall transfer level: Needs assistance Equipment used: Rolling walker (2 wheeled)(bilateral platforms) Transfers: Sit to/from Stand Sit to Stand: Min guard;From elevated surface         General transfer comment: Able to stand on 3rd attempt from raised EOB, reliant on momentum to power into standing; no physical assist required, min guard for safety  Ambulation/Gait Ambulation/Gait assistance: Supervision Gait Distance (Feet): 8 Feet Assistive device: Rolling walker (2 wheeled)(bilateral platforms) Gait Pattern/deviations: Step-through  pattern;Decreased stride length;Trunk flexed;Wide base of support Gait velocity: Decreased   General Gait Details: Slow, steady gait in room with platform RW, supervision for safety; intermittent standing rest breaks secondary to fatigue   Stairs             Wheelchair Mobility    Modified Rankin (Stroke Patients Only)       Balance Overall balance assessment: Needs assistance Sitting-balance support: Feet supported;No upper extremity supported Sitting balance-Leahy Scale: Good     Standing balance support: During functional activity;Bilateral upper extremity supported Standing balance-Leahy Scale: Poor Standing balance comment: Reliant on UE support                            Cognition Arousal/Alertness: Awake/alert Behavior During Therapy: WFL for tasks assessed/performed Overall Cognitive Status: Within Functional Limits for tasks assessed                                        Exercises      General Comments General comments (skin integrity, edema, etc.): Able to self-propel wheelchair in room by using feet, increased time. SpO2 down to 87% on 2L O2 [place], returning to >90% with seated rest      Pertinent Vitals/Pain Pain Assessment: No/denies pain    Home Living                      Prior Function            PT Goals (current goals can now  be found in the care plan section) Acute Rehab PT Goals Patient Stated Goal: home soon PT Goal Formulation: With patient Time For Goal Achievement: 11/11/18 Potential to Achieve Goals: Good Progress towards PT goals: Progressing toward goals    Frequency    Min 3X/week      PT Plan Current plan remains appropriate    Co-evaluation              AM-PAC PT "6 Clicks" Mobility   Outcome Measure  Help needed turning from your back to your side while in a flat bed without using bedrails?: A Little Help needed moving from lying on your back to sitting on the side of  a flat bed without using bedrails?: A Little Help needed moving to and from a bed to a chair (including a wheelchair)?: A Little Help needed standing up from a chair using your arms (e.g., wheelchair or bedside chair)?: A Little Help needed to walk in hospital room?: A Little Help needed climbing 3-5 steps with a railing? : Total 6 Click Score: 16    End of Session Equipment Utilized During Treatment: Gait belt;Oxygen Activity Tolerance: Patient tolerated treatment well Patient left: in chair;with call bell/phone within reach(seated in wheelchair) Nurse Communication: Mobility status PT Visit Diagnosis: Unsteadiness on feet (R26.81);Difficulty in walking, not elsewhere classified (R26.2)     Time: 5248-1859 PT Time Calculation (min) (ACUTE ONLY): 36 min  Charges:  $Therapeutic Activity: 8-22 mins $Self Care/Home Management: 8-22                    Mabeline Caras, PT, DPT Acute Rehabilitation Services  Pager [phone] Office [phone]  Derry Lory 11/02/2018, 4:52 PM

## 2018-11-02 NOTE — Progress Notes (Signed)
Progress Note    [name]  CHY:[id number] DOB: 1941-04-03  DOA: 10/27/2018 PCP: [name], MD    Brief Narrative:   Chief complaint: Shortness of breath  Medical records reviewed and are as summarized below:  [name] is an 78 y.o. male with past medical history significant for HTN, HLD, s/p TAVR, s/p PM, chronic diastolic CHF, paroxysmal atrial fibrillation, morbid obesity, DDD, and OSA on BiPAP; who had undergone cardioversion by Dr. Harrington Challenger 2/7 and was noted to remain hypoxic, currently admission requested per [facility].  Normally patient only requires oxygen at night. -Of note he weighed 157 kg ( 346 pounds ) on 09/25/2017, at the next cardiology visit on 1/17 he weighed 169 kg, and on admission he weighed 171 kg (378 lbs), which equates to over 30 pound weight gain. -Started on Lasix for acute on chronic diastolic CHF, improving with diuresis  Subjective:  Breathing is improving but still requires oxygen.  No nausea no vomiting.  Reports loose bowel movement.  No abdominal pain.  Assessment/Plan:   Acute respiratory failure with hypoxia:  -I suspect this is multifactorial but predominantly driven by fluid overload  -In the background of COPD, OSA/OHS  -Continues to improve nicely with IV Lasix, continue 40 mg 3 times daily, weight was down 9 pounds yesterday, suspect slight bump in weight this morning is inaccurate , negative over 2 L yesterday  -Attempt to wean O2 during the daytime, at baseline uses O2 nightly with CPAP only   Acute on chronic diastolic CHF -Echo 8/786 9 with EF of 60% -Significant 30 pound( 14 kg) weight gain over 1 month -He takes 20 mg of Lasix twice daily -Improving with IV Lasix, continue 40 mg IV 3 times daily, weight down by 9 pounds -Above, continue diuretics, I suspect he needs several more days of intravenous diuresis inpatient, monitor blood pressure and kidney function closely  Morbid obesity Body mass index is 46.59 kg/m.  -Discussed  importance for diet/lifestyle modification  COPD:  -No significant wheezing appreciated. -Continue breathing treatments scheduled  Atrial flutter/fibrillation with rapid ventricular response  -Is post permanent pacemaker, he was cardioverted to sinus rhythm 2/7 -Currently appears V paced, continue Eliquis  History of severe AS -status post TAVR in 2016 and PPM for AV block  Essential hypertension:  -Stable, monitor  Hyperlipidemia -Continue atorvastatin  Chronic venous insufficiency -Lac-Hydrin lotion twice daily  OSA on BiPAP -Continue BiPAP QHS  Family Communication/Anticipated D/C date and plan/Code Status   DVT prophylaxis: Eliquis Code Status: Full Code.  Family Communication: family present at bedside, discussed with wife  Disposition Plan: Home in few days pending adequate diuresis   Medical Consultants:    None.   Anti-Infectives:    None  Objective:    Vitals:   11/02/18 0839 11/02/18 1128 11/02/18 1130 11/02/18 1215  BP:  (!) 151/137  (!) 119/46  Pulse:      Resp:   16   Temp:  98.5 F (36.9 C)    TempSrc:  Oral    SpO2: 94% (!) 77% 96%   Weight:      Height:        Intake/Output Summary (Last 24 hours) at 11/02/2018 1758 Last data filed at 11/02/2018 1300 Gross per 24 hour  Intake 603 ml  Output 2175 ml  Net -1572 ml   Filed Weights   10/31/18 0546 11/01/18 0107 11/02/18 0333  Weight: (!) 168.2 kg (!) 169.4 kg (!) 164.6 kg  Exam Gen: Obese chronically ill-appearing male, sitting up in bed, pleasant, no distress, awake, Alert, Oriented X 3,  HEENT: PERRLA, Neck supple, no JVD Lungs: Distant breath sounds, improving air movement, decreased at bases CVS: S1-S2/regular rate rhythm Abd: soft, Non tender, non distended, BS present Extremities: 2+ edema, scaling and chronic venous stasis changes Skin: As above  Data Reviewed:   I have personally reviewed following labs and imaging studies:  Labs: Labs show the following:    Basic Metabolic Panel: Recent Labs  Lab 10/29/18 0512 10/30/18 0612 10/31/18 0510 11/01/18 0432 11/02/18 0434  NA 142 140 142 140 142  K 4.3 4.3 4.5 3.6 3.7  CL 99 96* 98 92* 91*  CO2 31 33* 30 37* 40*  GLUCOSE 96 104* 107* 102* 100*  BUN 27* 27* 29* 32* 28*  CREATININE 1.30* 1.23 1.25* 1.34* 1.28*  CALCIUM 8.8* 8.9 9.3 8.8* 8.8*   GFR Estimated Creatinine Clearance: 80 mL/min (A) (by C-G formula based on SCr of 1.28 mg/dL (H)). Liver Function Tests: No results for input(s): AST, ALT, ALKPHOS, BILITOT, PROT, ALBUMIN in the last 168 hours. No results for input(s): LIPASE, AMYLASE in the last 168 hours. No results for input(s): AMMONIA in the last 168 hours. Coagulation profile No results for input(s): INR, PROTIME in the last 168 hours.  CBC: Recent Labs  Lab 10/27/18 1107 10/27/18 1601 10/28/18 0740 10/30/18 0612 10/31/18 0510  WBC  --  7.2 8.6 7.0 7.7  HGB 12.2* 10.8* 10.7* 10.4* 11.4*  HCT 36.0* 36.9* 36.0* 33.8* 38.3*  MCV  --  98.7 99.2 99.4 99.2  PLT  --  128* 132* 125* 151   Cardiac Enzymes: Recent Labs  Lab 10/28/18 0829 10/29/18 0512  TROPONINI 0.04* 0.05*   BNP (last 3 results) No results for input(s): PROBNP in the last 8760 hours. CBG: No results for input(s): GLUCAP in the last 168 hours. D-Dimer: No results for input(s): DDIMER in the last 72 hours. Hgb A1c: No results for input(s): HGBA1C in the last 72 hours. Lipid Profile: No results for input(s): CHOL, HDL, LDLCALC, TRIG, CHOLHDL, LDLDIRECT in the last 72 hours. Thyroid function studies: No results for input(s): TSH, T4TOTAL, T3FREE, THYROIDAB in the last 72 hours.  Invalid input(s): FREET3 Anemia work up: No results for input(s): VITAMINB12, FOLATE, FERRITIN, TIBC, IRON, RETICCTPCT in the last 72 hours. Sepsis Labs: Recent Labs  Lab 10/27/18 1601 10/28/18 0740 10/30/18 0612 10/31/18 0510  WBC 7.2 8.6 7.0 7.7    Microbiology Recent Results (from the past 240 hour(s))   Respiratory Panel by PCR     Status: None   Collection Time: 10/27/18  2:54 PM  Result Value Ref Range Status   Adenovirus NOT DETECTED NOT DETECTED Final   Coronavirus 229E NOT DETECTED NOT DETECTED Final    Comment: (NOTE) The Coronavirus on the Respiratory Panel, DOES NOT test for the novel  Coronavirus (2019 nCoV)    Coronavirus HKU1 NOT DETECTED NOT DETECTED Final   Coronavirus NL63 NOT DETECTED NOT DETECTED Final   Coronavirus OC43 NOT DETECTED NOT DETECTED Final   Metapneumovirus NOT DETECTED NOT DETECTED Final   Rhinovirus / Enterovirus NOT DETECTED NOT DETECTED Final   Influenza A NOT DETECTED NOT DETECTED Final   Influenza B NOT DETECTED NOT DETECTED Final   Parainfluenza Virus 1 NOT DETECTED NOT DETECTED Final   Parainfluenza Virus 2 NOT DETECTED NOT DETECTED Final   Parainfluenza Virus 3 NOT DETECTED NOT DETECTED Final   Parainfluenza Virus 4 NOT DETECTED  NOT DETECTED Final   Respiratory Syncytial Virus NOT DETECTED NOT DETECTED Final   Bordetella pertussis NOT DETECTED NOT DETECTED Final   Chlamydophila pneumoniae NOT DETECTED NOT DETECTED Final   Mycoplasma pneumoniae NOT DETECTED NOT DETECTED Final    Comment: Performed at [facility], [place] [address], [place], [place] [address]    Procedures and diagnostic studies:  No results found.  Medications:   . acetaminophen  1,000 mg Oral BID  . apixaban  5 mg Oral BID  . aspirin EC  81 mg Oral Daily  . atorvastatin  60 mg Oral Daily  . cholecalciferol  5,000 Units Oral Daily  . ferrous [id number] C-folic acid  1 capsule Oral Once per day on Mon Thu  . furosemide  40 mg Intravenous BID  . gabapentin  300 mg Oral TID  . ipratropium-albuterol  3 mL Nebulization BID  . multivitamin with minerals  1 tablet Oral Daily  . polyethylene glycol  17 g Oral Daily  . senna-docusate  1 tablet Oral BID  . sodium chloride flush  3 mL Intravenous Q12H  . vitamin C  500 mg Oral Daily   Continuous Infusions:    LOS: 5 days   [facility]

## 2018-11-02 NOTE — Plan of Care (Signed)

## 2018-11-03 ENCOUNTER — Ambulatory Visit (HOSPITAL_COMMUNITY): Payer: PPO | Admitting: Nurse Practitioner

## 2018-11-03 ENCOUNTER — Other Ambulatory Visit: Payer: Self-pay

## 2018-11-03 LAB — BASIC METABOLIC PANEL
Anion gap: 9 (ref 5–15)
BUN: 32 mg/dL — ABNORMAL HIGH (ref 8–23)
CO2: 39 mmol/L — ABNORMAL HIGH (ref 22–32)
Calcium: 8.8 mg/dL — ABNORMAL LOW (ref 8.9–10.3)
Chloride: 94 mmol/L — ABNORMAL LOW (ref 98–111)
Creatinine, Ser: 1.37 mg/dL — ABNORMAL HIGH (ref 0.61–1.24)
GFR calc Af Amer: 58 mL/min — ABNORMAL LOW (ref >60)
GFR calc non Af Amer: 50 mL/min — ABNORMAL LOW (ref >60)
Glucose, Bld: 90 mg/dL (ref 70–99)
Potassium: 3.6 mmol/L (ref 3.5–5.1)
Sodium: 142 mmol/L (ref 135–145)

## 2018-11-03 LAB — GLUCOSE, CAPILLARY: Glucose-Capillary: 101 mg/dL — ABNORMAL HIGH (ref 70–99)

## 2018-11-03 MED ORDER — HYDROCORTISONE 2.5 % RE CREA
TOPICAL_CREAM | Freq: Two times a day (BID) | RECTAL | Status: DC
  Administered 2018-11-03 – 2018-11-04 (×3): via RECTAL
  Filled 2018-11-03: qty 28.35

## 2018-11-03 MED ORDER — FUROSEMIDE 40 MG PO TABS
40.0000 mg | ORAL_TABLET | Freq: Two times a day (BID) | ORAL | Status: DC
  Administered 2018-11-03 – 2018-11-04 (×3): 40 mg via ORAL
  Filled 2018-11-03 (×3): qty 1

## 2018-11-03 NOTE — Progress Notes (Signed)
Progress Note    [name]  CHE:[id number] DOB: [birth year]  DOA: 10/27/2018 PCP: [name], MD    Brief Narrative:   Chief complaint: Shortness of breath  Medical records reviewed and are as summarized below:  [name] is an 78 y.o. male with past medical history significant for HTN, HLD, s/p TAVR, s/p PM, chronic diastolic CHF, paroxysmal atrial fibrillation, morbid obesity, DDD, and OSA on BiPAP; who had undergone cardioversion by Dr. Harrington Challenger 2/7 and was noted to remain hypoxic, currently admission requested per [facility].  Normally patient only requires oxygen at night. -Of note he weighed 157 kg ( 346 pounds ) on 09/25/2017, at the next cardiology visit on 1/17 he weighed 169 kg, and on admission he weighed 171 kg (378 lbs), which equates to over 30 pound weight gain. -Started on Lasix for acute on chronic diastolic CHF, improving with diuresis  Subjective:  No chest pain no nausea no vomiting.  Feeling better breathing better.  Assessment/Plan:   Acute respiratory failure with hypoxia:  -I suspect this is multifactorial but predominantly driven by fluid overload  -In the background of COPD, OSA/OHS  -Continues to improve nicely with IV Lasix, transition to oral Lasix.  Monitor. -Attempt to wean O2 during the daytime, at baseline uses O2 nightly with CPAP only   Acute on chronic diastolic CHF -Echo 5/361 9 with EF of 60% -Significant 30 pound( 14 kg) weight gain over 1 month -He takes 20 mg of Lasix twice daily -Improving with IV Lasix  Morbid obesity Body mass index is 46.7 kg/m.  -Discussed importance for diet/lifestyle modification  COPD:  -No significant wheezing appreciated. -Continue breathing treatments scheduled  Atrial flutter/fibrillation with rapid ventricular response  -Is post permanent pacemaker, he was cardioverted to sinus rhythm 2/7 -Currently appears V paced, continue Eliquis  History of severe AS -status post TAVR in 2016 and PPM for AV  block  Essential hypertension:  -Stable, monitor  Hyperlipidemia -Continue atorvastatin  Chronic venous insufficiency -Lac-Hydrin lotion twice daily  OSA on BiPAP -Continue BiPAP QHS  Family Communication/Anticipated D/C date and plan/Code Status   DVT prophylaxis: Eliquis Code Status: Full Code.  Family Communication: family present at bedside, discussed with wife  Disposition Plan: Home in few days pending adequate diuresis   Medical Consultants:    None.   Anti-Infectives:    None  Objective:    Vitals:   11/03/18 0632 11/03/18 0913 11/03/18 0949 11/03/18 1208  BP: 118/72  114/74 113/62  Pulse: 70  90 70  Resp: 18  18 19   Temp: 98.6 F (37 C)   97.8 F (36.6 C)  TempSrc: Oral   Oral  SpO2: 90% 92% 94% 92%  Weight: (!) 165 kg     Height:        Intake/Output Summary (Last 24 hours) at 11/03/2018 1859 Last data filed at 11/03/2018 1313 Gross per 24 hour  Intake 600 ml  Output 1100 ml  Net -500 ml   Filed Weights   11/01/18 0107 11/02/18 0333 11/03/18 4431  Weight: (!) 169.4 kg (!) 164.6 kg (!) 165 kg    Exam Gen: Obese chronically ill-appearing male, sitting up in bed, pleasant, no distress, awake, Alert, Oriented X 3,  HEENT: PERRLA, Neck supple, no JVD Lungs: Distant breath sounds, improving air movement, decreased at bases CVS: S1-S2/regular rate rhythm Abd: soft, Non tender, non distended, BS present Extremities: 2+ edema, scaling and chronic venous stasis changes Skin: As above  Data  Reviewed:   I have personally reviewed following labs and imaging studies:  Labs: Labs show the following:   Basic Metabolic Panel: Recent Labs  Lab 10/30/18 0612 10/31/18 0510 11/01/18 0432 11/02/18 0434 11/03/18 0220  NA 140 142 140 142 142  K 4.3 4.5 3.6 3.7 3.6  CL 96* 98 92* 91* 94*  CO2 33* 30 37* 40* 39*  GLUCOSE 104* 107* 102* 100* 90  BUN 27* 29* 32* 28* 32*  CREATININE 1.23 1.25* 1.34* 1.28* 1.37*  CALCIUM 8.9 9.3 8.8* 8.8* 8.8*    GFR Estimated Creatinine Clearance: 74.8 mL/min (A) (by C-G formula based on SCr of 1.37 mg/dL (H)). Liver Function Tests: No results for input(s): AST, ALT, ALKPHOS, BILITOT, PROT, ALBUMIN in the last 168 hours. No results for input(s): LIPASE, AMYLASE in the last 168 hours. No results for input(s): AMMONIA in the last 168 hours. Coagulation profile No results for input(s): INR, PROTIME in the last 168 hours.  CBC: Recent Labs  Lab 10/28/18 0740 10/30/18 0612 10/31/18 0510  WBC 8.6 7.0 7.7  HGB 10.7* 10.4* 11.4*  HCT 36.0* 33.8* 38.3*  MCV 99.2 99.4 99.2  PLT 132* 125* 151   Cardiac Enzymes: Recent Labs  Lab 10/28/18 0829 10/29/18 0512  TROPONINI 0.04* 0.05*   BNP (last 3 results) No results for input(s): PROBNP in the last 8760 hours. CBG: Recent Labs  Lab 11/03/18 1741  GLUCAP 101*   D-Dimer: No results for input(s): DDIMER in the last 72 hours. Hgb A1c: No results for input(s): HGBA1C in the last 72 hours. Lipid Profile: No results for input(s): CHOL, HDL, LDLCALC, TRIG, CHOLHDL, LDLDIRECT in the last 72 hours. Thyroid function studies: No results for input(s): TSH, T4TOTAL, T3FREE, THYROIDAB in the last 72 hours.  Invalid input(s): FREET3 Anemia work up: No results for input(s): VITAMINB12, FOLATE, FERRITIN, TIBC, IRON, RETICCTPCT in the last 72 hours. Sepsis Labs: Recent Labs  Lab 10/28/18 0740 10/30/18 0612 10/31/18 0510  WBC 8.6 7.0 7.7    Microbiology Recent Results (from the past 240 hour(s))  Respiratory Panel by PCR     Status: None   Collection Time: 10/27/18  2:54 PM  Result Value Ref Range Status   Adenovirus NOT DETECTED NOT DETECTED Final   Coronavirus 229E NOT DETECTED NOT DETECTED Final    Comment: (NOTE) The Coronavirus on the Respiratory Panel, DOES NOT test for the novel  Coronavirus (2019 nCoV)    Coronavirus HKU1 NOT DETECTED NOT DETECTED Final   Coronavirus NL63 NOT DETECTED NOT DETECTED Final   Coronavirus OC43 NOT  DETECTED NOT DETECTED Final   Metapneumovirus NOT DETECTED NOT DETECTED Final   Rhinovirus / Enterovirus NOT DETECTED NOT DETECTED Final   Influenza A NOT DETECTED NOT DETECTED Final   Influenza B NOT DETECTED NOT DETECTED Final   Parainfluenza Virus 1 NOT DETECTED NOT DETECTED Final   Parainfluenza Virus 2 NOT DETECTED NOT DETECTED Final   Parainfluenza Virus 3 NOT DETECTED NOT DETECTED Final   Parainfluenza Virus 4 NOT DETECTED NOT DETECTED Final   Respiratory Syncytial Virus NOT DETECTED NOT DETECTED Final   Bordetella pertussis NOT DETECTED NOT DETECTED Final   Chlamydophila pneumoniae NOT DETECTED NOT DETECTED Final   Mycoplasma pneumoniae NOT DETECTED NOT DETECTED Final    Comment: Performed at [facility], [place]. [address], [place], [place] [address]    Procedures and diagnostic studies:  No results found.  Medications:   . acetaminophen  1,000 mg Oral BID  . ammonium lactate  Topical TID  . apixaban  5 mg Oral BID  . aspirin EC  81 mg Oral Daily  . atorvastatin  60 mg Oral Daily  . cholecalciferol  5,000 Units Oral Daily  . ferrous [id number] C-folic acid  1 capsule Oral Once per day on Mon Thu  . furosemide  40 mg Oral BID  . gabapentin  300 mg Oral TID  . hydrocortisone   Rectal BID  . ipratropium-albuterol  3 mL Nebulization BID  . multivitamin with minerals  1 tablet Oral Daily  . polyethylene glycol  17 g Oral Daily  . senna-docusate  1 tablet Oral BID  . sodium chloride flush  3 mL Intravenous Q12H  . vitamin C  500 mg Oral Daily   Continuous Infusions:   LOS: 6 days   [facility]

## 2018-11-03 NOTE — Care Management Important Message (Signed)
Important Message  Patient Details  Name: [name] MRN: [id number] Date of Birth: June 08, 1941   Medicare Important Message Given:  Yes    [name] 11/03/2018, 11:46 AM

## 2018-11-03 NOTE — Patient Outreach (Signed)
[facility]) Care Management  11/03/2018  ENRRIQUE MIERZWA 1940-12-15 [id number]   Referral received on 11-02-2018 from Hospital team.  Patient remains hospitalized. CM will continue to monitor hospital status and outreach after discharge when appropriate for further assessment.  Jone Baseman, RN, MSN [facility] Care Management Coordinator Direct Line [phone] Cell [phone] Toll Free: [phone]  Fax: [phone]

## 2018-11-03 NOTE — Progress Notes (Addendum)
Patient placed on home triology at this time, no distress noted. 3L O2 added.

## 2018-11-04 LAB — GLUCOSE, CAPILLARY
Glucose-Capillary: 88 mg/dL (ref 70–99)
Glucose-Capillary: 132 mg/dL — ABNORMAL HIGH (ref 70–99)

## 2018-11-04 LAB — BASIC METABOLIC PANEL
Anion gap: 8 (ref 5–15)
BUN: 27 mg/dL — ABNORMAL HIGH (ref 8–23)
CO2: 40 mmol/L — ABNORMAL HIGH (ref 22–32)
Calcium: 9.2 mg/dL (ref 8.9–10.3)
Chloride: 92 mmol/L — ABNORMAL LOW (ref 98–111)
Creatinine, Ser: 1.25 mg/dL — ABNORMAL HIGH (ref 0.61–1.24)
GFR calc Af Amer: >60 mL/min (ref >60)
GFR calc non Af Amer: 56 mL/min — ABNORMAL LOW (ref >60)
Glucose, Bld: 95 mg/dL (ref 70–99)
Potassium: 4.3 mmol/L (ref 3.5–5.1)
Sodium: 140 mmol/L (ref 135–145)

## 2018-11-04 MED ORDER — AMMONIUM LACTATE 12 % EX LOTN: TOPICAL_LOTION | Freq: Three times a day (TID) | CUTANEOUS | 0 refills | Status: DC

## 2018-11-04 MED ORDER — HYDROCORTISONE 2.5 % RE CREA: TOPICAL_CREAM | Freq: Two times a day (BID) | RECTAL | 0 refills | Status: DC

## 2018-11-04 MED ORDER — FUROSEMIDE 40 MG PO TABS: ORAL_TABLET | ORAL | 0 refills | Status: DC

## 2018-11-04 MED ORDER — HYDROCERIN EX CREA: 1.0000 "application " | TOPICAL_CREAM | Freq: Two times a day (BID) | CUTANEOUS | 0 refills | Status: DC | PRN

## 2018-11-04 NOTE — Progress Notes (Signed)
Discharge instruction given to patient and all questions and concerns answered and addressed.Patient is given education about heart failure and the importance of him to weight himself everyday, also keep an eye on his daily fluid intake and salt intake.

## 2018-11-04 NOTE — Care Management (Addendum)
Provided with resources for home meals. Notified [facility] that patient will DC to home today for [facility] services.

## 2018-11-06 ENCOUNTER — Ambulatory Visit (INDEPENDENT_AMBULATORY_CARE_PROVIDER_SITE_OTHER): Payer: PPO

## 2018-11-06 ENCOUNTER — Telehealth: Payer: Self-pay | Admitting: Physician Assistant

## 2018-11-06 ENCOUNTER — Other Ambulatory Visit: Payer: Self-pay

## 2018-11-06 ENCOUNTER — Telehealth: Payer: Self-pay | Admitting: Family Medicine

## 2018-11-06 ENCOUNTER — Telehealth: Payer: Self-pay | Admitting: *Deleted

## 2018-11-06 DIAGNOSIS — I441 Atrioventricular block, second degree: Secondary | ICD-10-CM

## 2018-11-06 NOTE — Telephone Encounter (Addendum)
**Note De-Identified [name] Obfuscation** Patient contacted regarding discharge from [facility] on 11/04/2018.  Patient understands to follow up with provider Ermalinda Barrios, PA-c on 11/14/2018 at 12:00 at [place] in [place]. Patient understands discharge instructions? Yes Patient understands medications and regiment? Yes Patient understands to bring all medications to this visit? Yes

## 2018-11-06 NOTE — Patient Outreach (Signed)
[facility]) Care Management  11/06/2018  [name] 19-Nov-1940 [id number]   Referral Date:  11/02/2018 Referral Source: [facility] Representative Date of Admission: 10/27/2018 Diagnosis: Heart Failure, shortness of breath Date of Discharge: 11/04/2018 Facility:  [place]:  [facility]  Outreach attempt: Spoke with patient.  He is able to verify HIPAA.  Discussed reason for call.  Patient states he has been doing well since being home.  He states that he is walking with no problems and has home health ordered and states he is very familiar with his therapist.  Patient has several follow up appointments scheduled.  Discussed [facility] services and support with patient.  He states that he knows what to do and feels like he is doing fine.  Reiterated with patient services and the benefit.  Patient declines services.  CM offered to send letter and brochure.  He states he is good with that and will call if he feels he needs our services.    Plan: RN CM will ne letter and close case.    Jone Baseman, RN, MSN [facility] Care Management Care Management Coordinator Direct Line [phone] Toll Free: [phone]  Fax: [phone]

## 2018-11-06 NOTE — Telephone Encounter (Signed)
New Message   TOC appt: 11/14/18 @12p  W/ Estella Husk

## 2018-11-06 NOTE — Telephone Encounter (Signed)
Copied from [place] [phone]. Topic: General - Other >> Nov 06, 2018 10:23 AM Adelene Idler wrote: Darlina Guys from [facility] called and stated they will be able to start services for the patient on 11/07/2018  CB# [phone]

## 2018-11-06 NOTE — Telephone Encounter (Signed)
Transition Care Management Follow-up Telephone Call   Date discharged?2815/20   How have you been since you were released from the hospital? "Real good"   Do you understand why you were in the hospital? yes   Do you understand the discharge instructions? yes   Where were you discharged to? Home with wife and son    Items Reviewed:  Medications reviewed: no, pt states he can discuss at appt. Reports he understands all of his meds and instructions  Allergies reviewed: yes  Dietary changes reviewed: yes  Referrals reviewed: yes   Functional Questionnaire:   Activities of Daily Living (ADLs):   He states they are independent in the following: ambulation, bathing and hygiene, feeding, continence, grooming, toileting and dressing. Pt uses walker States they require assistance with the following: na   Any transportation issues/concerns?: no   Any patient concerns? no   Confirmed importance and date/time of follow-up visits scheduled yes  Provider Appointment booked with PCP 11/16/18  Confirmed with patient if condition begins to worsen call PCP or go to the ER.  Patient was given the office number and encouraged to call back with question or concerns.  : yes

## 2018-11-07 DIAGNOSIS — Z7901 Long term (current) use of anticoagulants: Secondary | ICD-10-CM | POA: Diagnosis not present

## 2018-11-07 DIAGNOSIS — I739 Peripheral vascular disease, unspecified: Secondary | ICD-10-CM | POA: Diagnosis not present

## 2018-11-07 DIAGNOSIS — D649 Anemia, unspecified: Secondary | ICD-10-CM | POA: Diagnosis not present

## 2018-11-07 DIAGNOSIS — M109 Gout, unspecified: Secondary | ICD-10-CM | POA: Diagnosis not present

## 2018-11-07 DIAGNOSIS — H269 Unspecified cataract: Secondary | ICD-10-CM | POA: Diagnosis not present

## 2018-11-07 DIAGNOSIS — I4892 Unspecified atrial flutter: Secondary | ICD-10-CM | POA: Diagnosis not present

## 2018-11-07 DIAGNOSIS — R3915 Urgency of urination: Secondary | ICD-10-CM | POA: Diagnosis not present

## 2018-11-07 DIAGNOSIS — I5033 Acute on chronic diastolic (congestive) heart failure: Secondary | ICD-10-CM | POA: Diagnosis not present

## 2018-11-07 DIAGNOSIS — I050 Rheumatic mitral stenosis: Secondary | ICD-10-CM | POA: Diagnosis not present

## 2018-11-07 DIAGNOSIS — E785 Hyperlipidemia, unspecified: Secondary | ICD-10-CM | POA: Diagnosis not present

## 2018-11-07 DIAGNOSIS — J111 Influenza due to unidentified influenza virus with other respiratory manifestations: Secondary | ICD-10-CM | POA: Diagnosis not present

## 2018-11-07 DIAGNOSIS — I872 Venous insufficiency (chronic) (peripheral): Secondary | ICD-10-CM | POA: Diagnosis not present

## 2018-11-07 DIAGNOSIS — J069 Acute upper respiratory infection, unspecified: Secondary | ICD-10-CM | POA: Diagnosis not present

## 2018-11-07 DIAGNOSIS — G4733 Obstructive sleep apnea (adult) (pediatric): Secondary | ICD-10-CM | POA: Diagnosis not present

## 2018-11-07 DIAGNOSIS — K5900 Constipation, unspecified: Secondary | ICD-10-CM | POA: Diagnosis not present

## 2018-11-07 DIAGNOSIS — G5730 Lesion of lateral popliteal nerve, unspecified lower limb: Secondary | ICD-10-CM | POA: Diagnosis not present

## 2018-11-07 DIAGNOSIS — R350 Frequency of micturition: Secondary | ICD-10-CM | POA: Diagnosis not present

## 2018-11-07 DIAGNOSIS — M519 Unspecified thoracic, thoracolumbar and lumbosacral intervertebral disc disorder: Secondary | ICD-10-CM | POA: Diagnosis not present

## 2018-11-07 DIAGNOSIS — G629 Polyneuropathy, unspecified: Secondary | ICD-10-CM | POA: Diagnosis not present

## 2018-11-07 DIAGNOSIS — J449 Chronic obstructive pulmonary disease, unspecified: Secondary | ICD-10-CM | POA: Diagnosis not present

## 2018-11-07 DIAGNOSIS — I110 Hypertensive heart disease with heart failure: Secondary | ICD-10-CM | POA: Diagnosis not present

## 2018-11-07 DIAGNOSIS — I480 Paroxysmal atrial fibrillation: Secondary | ICD-10-CM | POA: Diagnosis not present

## 2018-11-07 DIAGNOSIS — Z7982 Long term (current) use of aspirin: Secondary | ICD-10-CM | POA: Diagnosis not present

## 2018-11-07 NOTE — Discharge Summary (Signed)
[facility] Discharge Summary   Patient: [name] ZMO:[id number]   PCP: [name], MD DOB: 1940-12-10   Date of admission: 10/27/2018   Date of discharge: 11/04/2018     Discharge Diagnoses:  Principal Problem:   Hypoxia Active Problems:   HYPERCHOLESTEROLEMIA   OBESITY, MORBID   Obstructive sleep apnea   Chronic venous insufficiency   COPD mixed type (HCC)   Shortness of breath on exertion   Essential hypertension   Upper respiratory infection, acute   Admitted From: home Disposition:  home  Recommendations for Outpatient Follow-up:  1. Please follow-up with PCP in 1 week.  Follow-up Information    Home, [facility].   Specialty:  [place] Why:  They will do your home health care at your home Contact information: [place] [address] [phone]        [name], MD. Schedule an appointment as soon as possible for a visit in 1 week(s).   Specialty:  Family Medicine Contact information: [address] [place] [address] [phone]        [place] Office. Schedule an appointment as soon as possible for a visit in 1 week(s).   Specialty:  Cardiology Why:  office will call you.  Contact information: [address] [place] [phone]         Diet recommendation: Cardiac diet  Activity: The patient is advised to gradually reintroduce usual activities.  Discharge Condition: good  Code Status: full code  History of present illness: As per the H and P dictated on admission, "[name] is a 78 y.o. male with medical history significant of aortic valve disease status post TAVR, no neuropathy, obstructive sleep apnea, hypertension, hyperlipidemia, degenerative disc disease, status post back surgery x2 who underwent cardioversion today was noted to be hypoxemic on arrival to the hospital.  His sats were in the 84%  range.  He uses 2 L of oxygen at home continuously.  He has a history of obstructive sleep apnea and COPD.  Patient states he has been wheezing recently and has had an upper respiratory infection.  He thought he had avoided it but his whole family has been sick at home recently with a what he calls "flu".  He is vague as to whether or not they were taking Tamiflu or whether or not they were positive for influenza virus. He was successfully cardioverted but despite that he remained hypoxemic and therefore is being referred [facility] for further evaluation and management."  Hospital Course:  Summary of his active problems in the hospital is as following. Acute respiratory failure with hypoxia:  -I suspect this is multifactorial but predominantly driven by fluid overload  -In the background of COPD, OSA/OHS  -Continues to improve nicely with IV Lasix, transition to oral Lasix.  Monitor. -Attempt to wean O2 during the daytime, at baseline uses O2 nightly with CPAP only   Acute on chronic diastolic CHF -Echo 4/496 9 with EF of 60% -Significant 30 pound( 14 kg) weight gain over 1 month -He takes 20 mg of Lasix twice daily -Improving with IV Lasix  Morbid obesity Body mass index is 46.7 kg/m.  -Discussed importance for diet/lifestyle modification  COPD:  -No significant wheezing appreciated. -Continue breathing treatments scheduled  Atrial flutter/fibrillation with rapid ventricular response  -Is post permanent pacemaker, he was cardioverted to sinus rhythm 2/7 -Currently  appears V paced, continue Eliquis  History of severe AS -status post TAVR in 2016 and PPM for AV block  Essential hypertension:  -Stable, monitor  Hyperlipidemia -Continue atorvastatin  Chronic venous insufficiency -Lac-Hydrin lotion twice daily  OSA on BiPAP -Continue BiPAP QHS   Patient was seen by physical therapy, who recommendedhome healthas arranged by case manager. On the day of the discharge  the patient's vitals were stable , and no other acute medical condition were reported by patient. the patient was felt safe to be discharge at home with home health.  Consultants: none Procedures: none  DISCHARGE MEDICATION: Allergies as of 11/04/2018      Reactions   Coumadin [warfarin Sodium] Other (See Comments)   States he can't be on this-bleeds out      Medication List    TAKE these medications   acetaminophen 500 MG tablet Commonly known as:  TYLENOL Take 1,000 mg by mouth 2 (two) times daily.   ammonium lactate 12 % lotion Commonly known as:  LAC-HYDRIN Apply topically 3 (three) times daily.   aspirin 81 MG EC tablet TAKE 1 TABLET (81 MG TOTAL) BY MOUTH DAILY.   atorvastatin 40 MG tablet Commonly known as:  LIPITOR Take 1 and a half tablet daily by mouth  (60mg ) What changed:    how much to take  how to take this  when to take this   BENGAY EX Apply 1 application topically at bedtime.   COQ10 PO Take 300 mg by mouth daily.   ELDERBERRY PO Take 2 tablets by mouth daily.   ELIQUIS 5 MG Tabs tablet Generic drug:  apixaban TAKE 1 TABLET BY MOUTH TWICE A DAY What changed:  how much to take   Ferrous Fumarate-Folic Acid 628-3 MG Tabs Commonly known as:  HEMATINIC/FOLIC ACID Take 1 tablet by mouth 2 (two) times a week.   furosemide 40 MG tablet Commonly known as:  LASIX Take 40 mg twice a day till 11/09/2018, after that take 40 mg daily. What changed:    medication strength  how much to take  how to take this  when to take this  additional instructions   gabapentin 300 MG capsule Commonly known as:  NEURONTIN Take 1 capsule (300 mg total) by mouth 3 (three) times daily.   hydrocerin Crea Apply 1 application topically 2 (two) times daily as needed (dry skin).   hydrocortisone 2.5 % rectal cream Commonly known as:  ANUSOL-HC Place rectally 2 (two) times daily.   multivitamin with minerals Tabs tablet Take 1 tablet by mouth daily.     polyethylene glycol packet Commonly known as:  MIRALAX / GLYCOLAX Take 17 g by mouth daily.   sennosides-docusate sodium 8.6-50 MG tablet Commonly known as:  SENOKOT-S Take 1 tablet by mouth 2 (two) times daily.   TART CHERRY ADVANCED PO Take 1,200 mg by mouth daily.   vitamin C 500 MG tablet Commonly known as:  ASCORBIC ACID Take 500 mg by mouth daily.   VITAMIN D PO Take 5,000 Units by mouth daily.      Allergies  Allergen Reactions  . Coumadin [Warfarin Sodium] Other (See Comments)    States he can't be on this-bleeds out   Discharge Instructions    AMB Referral to [place] Management   Complete by:  As directed    Patient is a89 male with history of HTN, HLD, status post TAVR- transcatheter aortic valve replacement, status post pacemaker placement, chronic diastolic congestive HF, paroxysmal atrial fibrillation, morbid  obesity, degenerative disc disease, COPD, chronic venous insufficiency, obstructive sleep apnea uses CPAP at hs, had undergone cardioversion.    Patient was admitted since he remained hypoxic even after his cardioversion, which was predominantly driven by fluid overload and was noted with 30 pounds weight gain over a month. Per chart review, patient is being diuresed with Lasix. He takes 20 mg of Lasix twice daily at home. He may need several more days of intravenous diuresis as inpatient with monitoring of blood pressure and kidney function closely.  He reports having a weighing scale at home but states that gait is not steady and admitsnot regularly monitoring weightnorrecording.  Patientmentionedhaving blood pressure cuff to check blood pressures butwould needtoregularly checkand record.Patient needs further instructions and reminders on diet restrictions/ lifestyle modifications. He acknowledges the need foreducation, reinforcement with adherence and compliance on ways to manage chronic health conditions. Patienthas minimal awareness  ofthesigns and symptoms ofHF/ COPDthatwill need medical assistance. Discussed COPD action plan with teachback method. Patient is not fully aware ofHF/ COPD zones/ tool and ways to manage chronic medical conditions.   Patient hadverbally agreed and opted for referralto [place] care managementcoordinator for assessment and follow-up inmanaging congestive HF/ COPD;reinforce adherence to medical management and treatment interventions (i.e: diet,medications, weighing daily and recording) for further management of HFand otherchronic health conditions at home.      Reason for consult:  Referral to [facility] care coordinator to assess home needs and for complex disease management of HF and otherchronic health conditions after discharge.   Diagnoses of:  Heart Failure   Expected date of contact:  1-3 days (reserved for hospital discharges)   Diet - low sodium heart healthy   Complete by:  As directed    Discharge instructions   Complete by:  As directed    It is important that you read the given instructions as well as go over your medication list with RN to help you understand your care after this hospitalization.  Discharge Instructions: Please follow-up with PCP in 1-2 weeks  Please request your primary care physician to go over all Hospital Tests and Procedure/Radiological results at the follow up. Please get all Hospital records sent to your PCP by signing hospital release before you go home.   Do not take more than prescribed Pain, Sleep and Anxiety Medications. You were cared for by a hospitalist during your hospital stay. If you have any questions about your discharge medications or the care you received while you were in the hospital after you are discharged, you can call the unit @UNIT @ you were admitted to and ask to speak with the hospitalist on call if the hospitalist that took care of you is not available.  Once you are discharged, your primary care physician  will handle any further medical issues. Please note that NO REFILLS for any discharge medications will be authorized once you are discharged, as it is imperative that you return to your primary care physician (or establish a relationship with a primary care physician if you do not have one) for your aftercare needs so that they can reassess your need for medications and monitor your lab values. You Must read complete instructions/literature along with all the possible adverse reactions/side effects for all the Medicines you take and that have been prescribed to you. Take any new Medicines after you have completely understood and accept all the possible adverse reactions/side effects. Wear Seat belts while driving. If you have smoked or chewed Tobacco in the last 2 yrs  please stop smoking and/or stop any Recreational drug use.  If you drink alcohol, please moderate the use and do not drive, operating heavy machinery, perform activities at heights, swimming or participation in water activities or provide baby sitting services under influence.   Increase activity slowly   Complete by:  As directed      Discharge Exam: Filed Weights   11/02/18 0333 11/03/18 0632 11/04/18 0509  Weight: (!) 164.6 kg (!) 165 kg (!) 164.6 kg   Vitals:   11/04/18 0509 11/04/18 0821  BP: 120/69   Pulse: 60   Resp: 18   Temp: 97.6 F (36.4 C)   SpO2: 91% 90%   General: Appear in no distress, no Rash; Oral Mucosa moist. Cardiovascular: S1 and S2 Present, no Murmur, no JVD Respiratory: Bilateral Air entry present and onClear to Auscultation, nono Crackles, o wheezes Abdomen: Bowel Sound onsent, Soft and no tenderness Extremities: no Pedal edema, no calf tenderness Neurology: Grossly no focal neuro deficit.o  The results of significant diagnostics from this hospitalization (including imaging, microbiology, ancillary and laboratory) are listed below for reference.    Significant Diagnostic Studies: Dg Chest 2  View  Result Date: 10/27/2018 CLINICAL DATA:  Dyspnea and shortness of breath for the past 2 days. EXAM: CHEST - 2 VIEW COMPARISON:  10/06/2018. FINDINGS: Stable enlarged cardiac silhouette and left subclavian pacemaker leads. The lungs remain clear with normal vascularity. Right shoulder degenerative changes and thoracic spine degenerative changes. IMPRESSION: No acute abnormality. Stable cardiomegaly. Electronically Signed   By: Claudie Revering M.D.   On: 10/27/2018 14:14    Microbiology: No results found for this or any previous visit (from the past 240 hour(s)).   Labs: CBC: No results for input(s): WBC, NEUTROABS, HGB, HCT, MCV, PLT in the last 168 hours. Basic Metabolic Panel: Recent Labs  Lab 11/01/18 0432 11/02/18 0434 11/03/18 0220 11/04/18 0716  NA 140 142 142 140  K 3.6 3.7 3.6 4.3  CL 92* 91* 94* 92*  CO2 37* 40* 39* 40*  GLUCOSE 102* 100* 90 95  BUN 32* 28* 32* 27*  CREATININE 1.34* 1.28* 1.37* 1.25*  CALCIUM 8.8* 8.8* 8.8* 9.2   Liver Function Tests: No results for input(s): AST, ALT, ALKPHOS, BILITOT, PROT, ALBUMIN in the last 168 hours. No results for input(s): LIPASE, AMYLASE in the last 168 hours. No results for input(s): AMMONIA in the last 168 hours. Cardiac Enzymes: No results for input(s): CKTOTAL, CKMB, CKMBINDEX, TROPONINI in the last 168 hours. BNP (last 3 results) Recent Labs    10/27/18 1601  BNP 125.9*   CBG: Recent Labs  Lab 11/03/18 1741 11/04/18 0756 11/04/18 1208  GLUCAP 101* 88 132*   Time spent: 35 minutes  Signed:  Berle Mull  [facility] 11/04/2018

## 2018-11-08 ENCOUNTER — Telehealth: Payer: Self-pay | Admitting: *Deleted

## 2018-11-08 NOTE — Telephone Encounter (Signed)
Received Authorization Confirmation for Home Health via [facility]; forwarded to provider/SLS 02/19

## 2018-11-09 ENCOUNTER — Telehealth: Payer: Self-pay | Admitting: Family Medicine

## 2018-11-09 ENCOUNTER — Ambulatory Visit (HOSPITAL_COMMUNITY)
Admit: 2018-11-09 | Discharge: 2018-11-09 | Disposition: A | Discharge status: Home / Self Care | Payer: PPO | Attending: Nurse Practitioner | Admitting: Nurse Practitioner

## 2018-11-09 ENCOUNTER — Encounter (HOSPITAL_COMMUNITY): Payer: Self-pay | Admitting: Physician Assistant

## 2018-11-09 ENCOUNTER — Other Ambulatory Visit (HOSPITAL_COMMUNITY): Payer: Self-pay | Admitting: *Deleted

## 2018-11-09 VITALS — BP 134/76 | HR 65 | Ht 74.0 in

## 2018-11-09 DIAGNOSIS — Z6841 Body Mass Index (BMI) 40.0 and over, adult: Secondary | ICD-10-CM | POA: Insufficient documentation

## 2018-11-09 DIAGNOSIS — I440 Atrioventricular block, first degree: Secondary | ICD-10-CM

## 2018-11-09 DIAGNOSIS — H269 Unspecified cataract: Secondary | ICD-10-CM | POA: Diagnosis not present

## 2018-11-09 DIAGNOSIS — Z7901 Long term (current) use of anticoagulants: Secondary | ICD-10-CM | POA: Diagnosis not present

## 2018-11-09 DIAGNOSIS — M1990 Unspecified osteoarthritis, unspecified site: Secondary | ICD-10-CM | POA: Diagnosis not present

## 2018-11-09 DIAGNOSIS — I4892 Unspecified atrial flutter: Secondary | ICD-10-CM | POA: Insufficient documentation

## 2018-11-09 DIAGNOSIS — E785 Hyperlipidemia, unspecified: Secondary | ICD-10-CM | POA: Insufficient documentation

## 2018-11-09 DIAGNOSIS — G4733 Obstructive sleep apnea (adult) (pediatric): Secondary | ICD-10-CM | POA: Diagnosis not present

## 2018-11-09 DIAGNOSIS — M109 Gout, unspecified: Secondary | ICD-10-CM | POA: Diagnosis not present

## 2018-11-09 DIAGNOSIS — G629 Polyneuropathy, unspecified: Secondary | ICD-10-CM | POA: Insufficient documentation

## 2018-11-09 DIAGNOSIS — I350 Nonrheumatic aortic (valve) stenosis: Secondary | ICD-10-CM | POA: Diagnosis not present

## 2018-11-09 DIAGNOSIS — I4819 Other persistent atrial fibrillation: Secondary | ICD-10-CM

## 2018-11-09 DIAGNOSIS — D649 Anemia, unspecified: Secondary | ICD-10-CM | POA: Diagnosis not present

## 2018-11-09 DIAGNOSIS — Z79899 Other long term (current) drug therapy: Secondary | ICD-10-CM | POA: Insufficient documentation

## 2018-11-09 DIAGNOSIS — I5032 Chronic diastolic (congestive) heart failure: Secondary | ICD-10-CM | POA: Diagnosis not present

## 2018-11-09 DIAGNOSIS — E669 Obesity, unspecified: Secondary | ICD-10-CM | POA: Insufficient documentation

## 2018-11-09 DIAGNOSIS — I4510 Unspecified right bundle-branch block: Secondary | ICD-10-CM

## 2018-11-09 DIAGNOSIS — Z87891 Personal history of nicotine dependence: Secondary | ICD-10-CM | POA: Diagnosis not present

## 2018-11-09 DIAGNOSIS — Z7982 Long term (current) use of aspirin: Secondary | ICD-10-CM | POA: Diagnosis not present

## 2018-11-09 DIAGNOSIS — I38 Endocarditis, valve unspecified: Secondary | ICD-10-CM | POA: Diagnosis not present

## 2018-11-09 HISTORY — DX: Unspecified right bundle-branch block: I45.10

## 2018-11-09 HISTORY — DX: Atrioventricular block, first degree: I44.0

## 2018-11-09 LAB — BASIC METABOLIC PANEL
Anion gap: 10 (ref 5–15)
BUN: 37 mg/dL — ABNORMAL HIGH (ref 8–23)
CO2: 36 mmol/L — ABNORMAL HIGH (ref 22–32)
Calcium: 9.4 mg/dL (ref 8.9–10.3)
Chloride: 94 mmol/L — ABNORMAL LOW (ref 98–111)
Creatinine, Ser: 1.3 mg/dL — ABNORMAL HIGH (ref 0.61–1.24)
GFR calc Af Amer: >60 mL/min (ref >60)
GFR calc non Af Amer: 53 mL/min — ABNORMAL LOW (ref >60)
Glucose, Bld: 105 mg/dL — ABNORMAL HIGH (ref 70–99)
Potassium: 4.3 mmol/L (ref 3.5–5.1)
Sodium: 140 mmol/L (ref 135–145)

## 2018-11-09 MED ORDER — FUROSEMIDE 40 MG PO TABS: 40.0000 mg | ORAL_TABLET | Freq: Every day | ORAL | Status: DC

## 2018-11-09 NOTE — Progress Notes (Signed)
Primary Care Physician: [name], MD Referring Physician: Device clinic Cardiologist: Dr. [name] is a 78 y.o. male with a h/o morbid obesity, severe aortic stenosis, s/p TAVR in 2016, PPM for AV block, 04/27/18. She was referred to the afib clinic after device clinic noted afib x 2 days. He has a CHA2DS2VASc score of at least 2 . He has been on asa and Plavix since TAVR and states that he bleeds easily. He denies any awareness of afib, no alcohol, some caffeine. Wears cpap.   F/u Afib clinic 11/09/18. Patient underwent DCCV on 10/27/18 and developed acute hypoxia, likely multifactorial in setting of COPD and fluid overload. He was admitted to the hospital for diuresis. He has not missed any doses of his Eliquis. He is in SR today.  Today, he denies symptoms of palpitations, chest pain, orthopnea, PND, dizziness, presyncope, syncope, or neurologic sequela. The patient is tolerating medications without difficulties and is otherwise without complaint today.   Past Medical History:  Diagnosis Date  . Anemia   . Aortic stenosis   . Aortic stenosis, severe    S/p Edwards Sapien 3 Transcatheter Heart Valve (size 26 mm, model # [id number], serial # [id number])  . Arthritis   . Back pain   . Bursitis   . Cataract    left immature  . Cellulitis 10/12/2015  . Complication of anesthesia    Halucinations  . Constipation    takes Miralax daily as well as Senokot daily  . DDD (degenerative disc disease)   . Gout    takes Allopurinol daily  . Heart valve disorder   . History of blood clots 1962   knee  . History of blood transfusion    no abnormal reaction noted  . History of shingles   . Hyperlipidemia    takes Atorvastatin daily  . Hypertension    takes Lisinopril daily  . Joint pain   . Low back pain 01/25/2017  . Neck pain on left side 01/25/2017  . OSA (obstructive sleep apnea)   . Osteoarthritis   . Peripheral edema    takes Furosemide daily  . Peripheral neuropathy     takes Gabapentin daily  . Peroneal palsy    significant right foot drop  . Pneumonia 25+yrs ago   hx of  . Shortness of breath   . Swelling of extremity   . Urinary frequency   . Urinary urgency   . Valvular heart disease    Past Surgical History:  Procedure Laterality Date  . CARDIAC CATHETERIZATION N/A 05/02/2015   Procedure: Right/Left Heart Cath and Coronary Angiography;  Surgeon: Burnell Blanks, MD;  Location: [facility] CV LAB;  Service: Cardiovascular;  Laterality: N/A;  . CARDIOVERSION N/A 10/27/2018   Procedure: CARDIOVERSION;  Surgeon: Fay Records, MD;  Location: [facility] ENDOSCOPY;  Service: Cardiovascular;  Laterality: N/A;  . COLONOSCOPY N/A 02/07/2013   Procedure: COLONOSCOPY;  Surgeon: Juanita Craver, MD;  Location: [facility] ENDOSCOPY;  Service: Endoscopy;  Laterality: N/A;  . COLONOSCOPY N/A 02/09/2013   Procedure: COLONOSCOPY;  Surgeon: Beryle Beams, MD;  Location: [facility];  Service: Endoscopy;  Laterality: N/A;  . ESOPHAGOGASTRODUODENOSCOPY N/A 02/07/2013   Procedure: ESOPHAGOGASTRODUODENOSCOPY (EGD);  Surgeon: Juanita Craver, MD;  Location: [facility] ENDOSCOPY;  Service: Endoscopy;  Laterality: N/A;  . GIVENS CAPSULE STUDY N/A 02/09/2013   Procedure: GIVENS CAPSULE STUDY;  Surgeon: Beryle Beams, MD;  Location: [facility];  Service: Endoscopy;  Laterality: N/A;  . HERNIA  REPAIR     umbilical hernia  . KNEE SURGERY Right    multiple knee surgeries due to complication of R TKA  . LAMINOTOMY  9935   c6-t2  . PACEMAKER IMPLANT N/A 04/27/2018   Procedure: PACEMAKER IMPLANT;  Surgeon: Constance Haw, MD;  Location: [facility] CV LAB;  Service: Cardiovascular;  Laterality: N/A;  . SPINE SURGERY    . TEE WITHOUT CARDIOVERSION N/A 03/07/2015   Procedure: TRANSESOPHAGEAL ECHOCARDIOGRAM (TEE);  Surgeon: Josue Hector, MD;  Location: [facility] ENDOSCOPY;  Service: Cardiovascular;  Laterality: N/A;  ANES TO BRING PROPOFOL PER DOCTOR  . TEE WITHOUT CARDIOVERSION N/A 06/10/2015    Procedure: TRANSESOPHAGEAL ECHOCARDIOGRAM (TEE);  Surgeon: Burnell Blanks, MD;  Location: [facility];  Service: Open Heart Surgery;  Laterality: N/A;  . TOTAL KNEE ARTHROPLASTY     right  . TRANSCATHETER AORTIC VALVE REPLACEMENT, TRANSFEMORAL Left 06/10/2015   Procedure: TRANSCATHETER AORTIC VALVE REPLACEMENT, TRANSFEMORAL;  Surgeon: Burnell Blanks, MD;  Location: [facility];  Service: Open Heart Surgery;  Laterality: Left;    Current Outpatient Medications  Medication Sig Dispense Refill  . acetaminophen (TYLENOL) 500 MG tablet Take 1,000 mg by mouth 2 (two) times daily.     [name] ammonium lactate (LAC-HYDRIN) 12 % lotion Apply topically 3 (three) times daily. 400 g 0  . aspirin 81 MG EC tablet TAKE 1 TABLET (81 MG TOTAL) BY MOUTH DAILY. 30 tablet 11  . atorvastatin (LIPITOR) 40 MG tablet Take 1 and a half tablet daily by mouth  (60mg ) (Patient taking differently: Take 60 mg by mouth daily. Take 1 and a half tablet daily by mouth  (60mg )) 135 tablet 5  . Coenzyme Q10 (COQ10 PO) Take 300 mg by mouth daily.     [name] ELDERBERRY PO Take 2 tablets by mouth daily.     [name] ELIQUIS 5 MG TABS tablet TAKE 1 TABLET BY MOUTH TWICE A DAY (Patient taking differently: Take 5 mg by mouth 2 (two) times daily. ) 60 tablet 6  . Ferrous Fumarate-Folic Acid (HEMATINIC/FOLIC ACID) 701-7 MG TABS Take 1 tablet by mouth 2 (two) times a week. 90 each 1  . furosemide (LASIX) 40 MG tablet Take 40 mg by mouth.    . gabapentin (NEURONTIN) 300 MG capsule Take 1 capsule (300 mg total) by mouth 3 (three) times daily. 270 capsule 2  . hydrocerin (EUCERIN) CREA Apply 1 application topically 2 (two) times daily as needed (dry skin). 113 g 0  . hydrocortisone (ANUSOL-HC) 2.5 % rectal cream Place rectally 2 (two) times daily. 30 g 0  . Menthol, Topical Analgesic, (BENGAY EX) Apply 1 application topically at bedtime.    . Misc Natural Products (TART CHERRY ADVANCED PO) Take 1,200 mg by mouth daily.     . Multiple Vitamin (MULTIVITAMIN  WITH MINERALS) TABS Take 1 tablet by mouth daily.    . polyethylene glycol (MIRALAX / GLYCOLAX) packet Take 17 g by mouth daily.    . sennosides-docusate sodium (SENOKOT-S) 8.6-50 MG tablet Take 1 tablet by mouth 2 (two) times daily.    . vitamin C (ASCORBIC ACID) 500 MG tablet Take 500 mg by mouth daily.    [name] VITAMIN D PO Take 5,000 Units by mouth daily.      No current facility-administered medications for this encounter.     Allergies  Allergen Reactions  . Coumadin [Warfarin Sodium] Other (See Comments)    States he can't be on this-bleeds out    Social History  Socioeconomic History  . Marital status: Married    Spouse name: [name]  . Number of children: 3  . Years of education: Not on file  . Highest education level: Not on file  Occupational History  . Occupation: retired Investment banker, operational  . Financial resource strain: Not on file  . Food insecurity:    Worry: Not on file    Inability: Not on file  . Transportation needs:    Medical: Not on file    Non-medical: Not on file  Tobacco Use  . Smoking status: Former Smoker    Years: 30.00    Types: Pipe    Last attempt to quit: 09/20/2006    Years since quitting: 12.1  . Smokeless tobacco: Never Used  Substance and Sexual Activity  . Alcohol use: No    Alcohol/week: 0.0 standard drinks  . Drug use: No  . Sexual activity: Never    Comment: lives with wife, retired, no dietary restrictions  Lifestyle  . Physical activity:    Days per week: Not on file    Minutes per session: Not on file  . Stress: Not on file  Relationships  . Social connections:    Talks on phone: Not on file    Gets together: Not on file    Attends religious service: Not on file    Active member of club or organization: Not on file    Attends meetings of clubs or organizations: Not on file    Relationship status: Not on file  . Intimate partner violence:    Fear of current or ex partner: Not on file    Emotionally abused: Not on  file    Physically abused: Not on file    Forced sexual activity: Not on file  Other Topics Concern  . Not on file  Social History Narrative  . Not on file    Family History  Problem Relation Age of Onset  . Other Father        POSSIBLE HEART ATTACK  . Arthritis Sister        "crippling"  . Arthritis Sister     ROS- All systems are reviewed and negative except as per the HPI above  Physical Exam: Vitals:   11/09/18 1053  BP: 134/76  Pulse: 65  Height: 6\' 2"  (1.88 m)   Wt Readings from Last 3 Encounters:  11/04/18 (!) 164.6 kg  10/06/18 (!) 169.6 kg  10/02/18 (!) 169.6 kg    Labs: Lab Results  Component Value Date   NA 140 11/04/2018   K 4.3 11/04/2018   CL 92 (L) 11/04/2018   CO2 40 (H) 11/04/2018   GLUCOSE 95 11/04/2018   BUN 27 (H) 11/04/2018   CREATININE 1.25 (H) 11/04/2018   CALCIUM 9.2 11/04/2018   PHOS 4.0 02/21/2015   MG 2.2 06/11/2015   Lab Results  Component Value Date   INR 1.32 06/10/2015   Lab Results  Component Value Date   CHOL 140 08/22/2018   HDL 30 (L) 08/22/2018   LDLCALC 85 08/22/2018   TRIG 126 08/22/2018     GEN- The patient is well appearing obese male, alert and oriented x 3 today. HEENT-head normocephalic, atraumatic, sclera clear, conjunctiva pink, hearing intact, trachea midline. Lungs- Clear to ausculation bilaterally, normal work of breathing Heart- Regular rate and rhythm, no murmurs, rubs or gallops  GI- soft, NT, ND, + BS Extremities- no clubbing, cyanosis, trace edema MS- no significant deformity or atrophy Skin- chronic  stasis changes Psych- euthymic mood, full affect Neuro- strength and sensation are intact   EKG- SR HR 65, RBBB, 1st degree AV block, PR 256, QRS 122, QTc 443  Epic notes reviewed.   Assessment and Plan: 1. Persistent Afib/flutter S/p DCCV. Appears to be maintaining SR. His SOB has improved with restoration of SR and diuresis.  Continue Eliquis 5 mg BID  This patients CHA2DS2-VASc Score  and unadjusted Ischemic Stroke Rate (% per year) is equal to 2.2 % stroke rate/year from a score of 2  Above score calculated as 1 point each if present [CHF, HTN, DM, Vascular=MI/PAD/Aortic Plaque, Age if 65-74, or Male] Above score calculated as 2 points each if present [Age > 75, or Stroke/TIA/TE]   2. HTN Stable, no changes today.  3. PPM Per device clinic and Dr Curt Bears  4. OSA Compliant with CPAP therapy.  5. Obesity Body mass index is 46.58 kg/m. Lifestyle modifications encouraged.  6. Chronic HFpEF No signs of overt fluid overload today. He reports that he has changed his diet to restrict his sodium intake.  Continue Lasix Recheck Bmet  Follow up with Dr [name] and Dr Curt Bears as scheduled.  [facility] [address] [place], [place] [address] [phone]

## 2018-11-09 NOTE — Telephone Encounter (Signed)
Copied from [place] [id number]. Topic: Quick Communication - See Telephone Encounter >> Nov 09, 2018  9:37 AM Antonieta Iba C wrote: CRM for notification. See Telephone encounter for: 11/09/18.  Joey - PT with [facility] - [phone] - okay to lvm   Frequency: 1 week 3

## 2018-11-09 NOTE — Telephone Encounter (Signed)
Verbal orders given  

## 2018-11-12 LAB — CUP PACEART REMOTE DEVICE CHECK
Battery Remaining Longevity: 124 mo
Battery Remaining Percentage: 95.5 %
Battery Voltage: 3.02 V
Brady Statistic AP VP Percent: 36 %
Brady Statistic AP VS Percent: 1 %
Brady Statistic AS VP Percent: 39 %
Brady Statistic AS VS Percent: 24 %
Brady Statistic RA Percent Paced: 20 %
Brady Statistic RV Percent Paced: 85 %
Date Time Interrogation Session: 20200216050101
Implantable Lead Implant Date: 20190808
Implantable Lead Implant Date: 20190808
Implantable Lead Location: 753859
Implantable Lead Location: 753860
Implantable Pulse Generator Implant Date: 20190808
Lead Channel Impedance Value: 450 Ohm
Lead Channel Impedance Value: 510 Ohm
Lead Channel Pacing Threshold Amplitude: 0.375 V
Lead Channel Pacing Threshold Amplitude: 0.625 V
Lead Channel Pacing Threshold Pulse Width: 0.5 ms
Lead Channel Pacing Threshold Pulse Width: 0.5 ms
Lead Channel Sensing Intrinsic Amplitude: 12 mV
Lead Channel Sensing Intrinsic Amplitude: 2.3 mV
Lead Channel Setting Pacing Amplitude: 0.875
Lead Channel Setting Pacing Amplitude: 1.375
Lead Channel Setting Pacing Pulse Width: 0.5 ms
Lead Channel Setting Sensing Sensitivity: 2.5 mV
Pulse Gen Model: 2272
Pulse Gen Serial Number: 9052893

## 2018-11-13 ENCOUNTER — Inpatient Hospital Stay: Payer: PPO | Admitting: Internal Medicine

## 2018-11-13 ENCOUNTER — Ambulatory Visit (INDEPENDENT_AMBULATORY_CARE_PROVIDER_SITE_OTHER): Payer: PPO | Admitting: Family Medicine

## 2018-11-13 ENCOUNTER — Encounter: Payer: Self-pay | Admitting: Family Medicine

## 2018-11-13 DIAGNOSIS — N289 Disorder of kidney and ureter, unspecified: Secondary | ICD-10-CM

## 2018-11-13 DIAGNOSIS — R3915 Urgency of urination: Secondary | ICD-10-CM | POA: Diagnosis not present

## 2018-11-13 DIAGNOSIS — Z7982 Long term (current) use of aspirin: Secondary | ICD-10-CM | POA: Diagnosis not present

## 2018-11-13 DIAGNOSIS — J449 Chronic obstructive pulmonary disease, unspecified: Secondary | ICD-10-CM | POA: Diagnosis not present

## 2018-11-13 DIAGNOSIS — R739 Hyperglycemia, unspecified: Secondary | ICD-10-CM | POA: Diagnosis not present

## 2018-11-13 DIAGNOSIS — E7800 Pure hypercholesterolemia, unspecified: Secondary | ICD-10-CM | POA: Diagnosis not present

## 2018-11-13 DIAGNOSIS — I872 Venous insufficiency (chronic) (peripheral): Secondary | ICD-10-CM | POA: Diagnosis not present

## 2018-11-13 DIAGNOSIS — I480 Paroxysmal atrial fibrillation: Secondary | ICD-10-CM | POA: Diagnosis not present

## 2018-11-13 DIAGNOSIS — G629 Polyneuropathy, unspecified: Secondary | ICD-10-CM | POA: Diagnosis not present

## 2018-11-13 DIAGNOSIS — M109 Gout, unspecified: Secondary | ICD-10-CM | POA: Diagnosis not present

## 2018-11-13 DIAGNOSIS — D649 Anemia, unspecified: Secondary | ICD-10-CM | POA: Diagnosis not present

## 2018-11-13 DIAGNOSIS — I110 Hypertensive heart disease with heart failure: Secondary | ICD-10-CM | POA: Diagnosis not present

## 2018-11-13 DIAGNOSIS — I4892 Unspecified atrial flutter: Secondary | ICD-10-CM | POA: Diagnosis not present

## 2018-11-13 DIAGNOSIS — E785 Hyperlipidemia, unspecified: Secondary | ICD-10-CM | POA: Diagnosis not present

## 2018-11-13 DIAGNOSIS — H269 Unspecified cataract: Secondary | ICD-10-CM | POA: Diagnosis not present

## 2018-11-13 DIAGNOSIS — I4819 Other persistent atrial fibrillation: Secondary | ICD-10-CM

## 2018-11-13 DIAGNOSIS — G4733 Obstructive sleep apnea (adult) (pediatric): Secondary | ICD-10-CM | POA: Diagnosis not present

## 2018-11-13 DIAGNOSIS — M519 Unspecified thoracic, thoracolumbar and lumbosacral intervertebral disc disorder: Secondary | ICD-10-CM | POA: Diagnosis not present

## 2018-11-13 DIAGNOSIS — R350 Frequency of micturition: Secondary | ICD-10-CM | POA: Diagnosis not present

## 2018-11-13 DIAGNOSIS — I739 Peripheral vascular disease, unspecified: Secondary | ICD-10-CM | POA: Diagnosis not present

## 2018-11-13 DIAGNOSIS — J111 Influenza due to unidentified influenza virus with other respiratory manifestations: Secondary | ICD-10-CM | POA: Diagnosis not present

## 2018-11-13 DIAGNOSIS — K5900 Constipation, unspecified: Secondary | ICD-10-CM | POA: Diagnosis not present

## 2018-11-13 DIAGNOSIS — G5730 Lesion of lateral popliteal nerve, unspecified lower limb: Secondary | ICD-10-CM | POA: Diagnosis not present

## 2018-11-13 DIAGNOSIS — I5033 Acute on chronic diastolic (congestive) heart failure: Secondary | ICD-10-CM | POA: Diagnosis not present

## 2018-11-13 DIAGNOSIS — I10 Essential (primary) hypertension: Secondary | ICD-10-CM

## 2018-11-13 DIAGNOSIS — Z7901 Long term (current) use of anticoagulants: Secondary | ICD-10-CM | POA: Diagnosis not present

## 2018-11-13 DIAGNOSIS — I050 Rheumatic mitral stenosis: Secondary | ICD-10-CM | POA: Diagnosis not present

## 2018-11-13 DIAGNOSIS — J069 Acute upper respiratory infection, unspecified: Secondary | ICD-10-CM | POA: Diagnosis not present

## 2018-11-13 NOTE — Assessment & Plan Note (Signed)
minimize simple carbs. Increase exercise as tolerated.  

## 2018-11-13 NOTE — Assessment & Plan Note (Signed)
Encouraged heart healthy diet, increase exercise, avoid trans fats, consider a krill oil cap daily 

## 2018-11-13 NOTE — Assessment & Plan Note (Signed)
Hydrate and monitor 

## 2018-11-13 NOTE — Assessment & Plan Note (Signed)
Rate controlled and tolerating Eliquis  

## 2018-11-13 NOTE — Patient Instructions (Addendum)
Atrial Fibrillation Atrial fibrillation is a type of irregular or rapid heartbeat (arrhythmia). In atrial fibrillation, the top part of the heart (atria) quivers in a chaotic pattern. This makes the heart unable to pump blood normally. Having atrial fibrillation can increase your risk for other health problems, such as:  Blood can pool in the atria and form clots. If a clot travels to the brain, it can cause a stroke.  The heart muscle may weaken from the irregular blood flow. This can cause heart failure. Atrial fibrillation may start suddenly and stop on its own, or it may become a long-lasting problem. What are the causes?i This condition is caused by some heart-related conditions or procedures, including:  High blood pressure. This is the most common cause.  Heart failure.  Heart valve conditions.  Inflammation of the sac that surrounds the heart (pericarditis).  Heart surgery.  Coronary artery disease.  Certain heart rhythm disorders, such as Wolf-Parkinson-White syndrome. Other causes include:  Pneumonia.  Obstructive sleep apnea.  Lung cancer.  Thyroid problems, especially if the thyroid is overactive (hyperthyroidism).  Excessive alcohol or drug use. Sometimes, the cause of this condition is not known. What increases the risk? This condition is more likely to develop in:  Older people.  People who smoke.  People who have diabetes mellitus.  People who are overweight (obese).  Athletes who exercise vigorously.  People who have a family history. What are the signs or symptoms? Symptoms of this condition include:  A feeling that your heart is beating rapidly or irregularly.  A feeling of discomfort or pain in your chest.  Shortness of breath.  Sudden light-headedness or weakness.  Getting tired easily during exercise. In some cases, there are no symptoms. How is this diagnosed? Your health care provider may be able to detect atrial fibrillation when  taking your pulse. If detected, this condition may be diagnosed with:  Electrocardiogram (ECG).  Ambulatory cardiac monitor. This device records your heartbeats for 24 hours or more.  Transthoracic echocardiogram (TTE) to evaluate how blood flows through your heart.  Transesophageal echocardiogram (TEE) to view more detailed images of your heart.  A stress test.  Imaging tests, such as a CT scan or chest X-ray.  Blood tests. How is this treated? This condition may be treated with:  Medicines to slow down the heart rate or bring the heart's rhythm back to normal.  Medicines to prevent blood clots from forming.  Electrical cardioversion. This delivers a low-energy shock to the heart to reset its rhythm.  Ablation. This procedure destroys the part of the heart tissue that sends abnormal signals.  Left atrial appendage occlusion/excision. This seals off a common place in the atria where blood clots can form (left atrial appendage). The goal of treatment is to prevent blood clots from forming and to keep your heart beating at a normal rate and rhythm. Treatment depends on underlying medical conditions and how you feel when you are experiencing fibrillation. Follow these instructions at home: Medicines  Take over-the counter and prescription medicines only as told by your health care provider.  If your health care provider prescribed a blood-thinning medicine (anticoagulant), take it exactly as told. Taking too much blood-thinning medicine can cause bleeding. Taking too little can enable a blood clot to form and travel to the brain, causing a stroke. Lifestyle      Do not use any products that contain nicotine or tobacco, such as cigarettes and e-cigarettes. If you need help quitting, ask your health  care provider.  Do not drink beverages that contain caffeine, such as coffee, soda, and tea.  Follow diet instructions as told by your health care provider.  Exercise regularly as  told by your health care provider.  Do not drink alcohol. General instructions  If you have obstructive sleep apnea, manage your condition as told by your health care provider.  Maintain a healthy weight. Do not use diet pills unless your health care provider approves. Diet pills may make heart problems worse.  Keep all follow-up visits as told by your health care provider. This is important. Contact a health care provider if you:  Notice a change in the rate, rhythm, or strength of your heartbeat.  Are taking an anticoagulant and you notice increased bruising.  Tire more easily when you exercise or exert yourself.  Have a sudden change in weight. Get help right away if you have:   Chest pain, abdominal pain, sweating, or weakness.  Difficulty breathing.  Blood in your vomit, stool (feces), or urine.  Any symptoms of a stroke. "BE FAST" is an easy way to remember the main warning signs of a stroke: ? B - Balance. Signs are dizziness, sudden trouble walking, or loss of balance. ? E - Eyes. Signs are trouble seeing or a sudden change in vision. ? F - Face. Signs are sudden weakness or numbness of the face, or the face or eyelid drooping on one side. ? A - Arms. Signs are weakness or numbness in an arm. This happens suddenly and usually on one side of the body. ? S - Speech. Signs are sudden trouble speaking, slurred speech, or trouble understanding what people say. ? T - Time. Time to call emergency services. Write down what time symptoms started.  Other signs of a stroke, such as: ? A sudden, severe headache with no known cause. ? Nausea or vomiting. ? Seizure. These symptoms may represent a serious problem that is an emergency. Do not wait to see if the symptoms will go away. Get medical help right away. Call your local emergency services (911 in the [place] Do not drive yourself to the hospital. Summary  Atrial fibrillation is a type of irregular or rapid heartbeat  (arrhythmia).  Symptoms include a feeling that your heart is beating fast or irregularly. In some cases, you may not have symptoms.  The condition is treated with medicines to slow down the heart rate or bring the heart's rhythm back to normal. You may also need blood-thinning medicines to prevent blood clots.  Get help right away if you have symptoms or signs of a stroke. This information is not intended to replace advice given to you by your health care provider. Make sure you discuss any questions you have with your health care provider. Document Released: 09/06/2005 Document Revised: 10/28/2017 Document Reviewed: 10/28/2017 Elsevier Interactive Patient Education  2019 Elsevier Inc.  

## 2018-11-13 NOTE — Progress Notes (Signed)
Subjective:    Patient ID: [name], male    DOB: 06-20-1941, 78 y.o.   MRN: [id number]  No chief complaint on file.   HPI Patient is in today for follow up. He is feeling well today no recent febrile illness or hospitalizations. His power wheelchair is working better and he is currently working on getting a new one. He has not had any further flares in his atrial fibrillation. Denies CP/palp/SOB/HA/congestion/fevers/GI or GU c/o. Taking meds as prescribed  Past Medical History:  Diagnosis Date  . Anemia   . Aortic stenosis   . Aortic stenosis, severe    S/p Edwards Sapien 3 Transcatheter Heart Valve (size 26 mm, model # [id number], serial # [id number])  . Arthritis   . Back pain   . Bursitis   . Cataract    left immature  . Cellulitis 10/12/2015  . Complication of anesthesia    Halucinations  . Constipation    takes Miralax daily as well as Senokot daily  . DDD (degenerative disc disease)   . Gout    takes Allopurinol daily  . Heart valve disorder   . History of blood clots 1962   knee  . History of blood transfusion    no abnormal reaction noted  . History of shingles   . Hyperlipidemia    takes Atorvastatin daily  . Hypertension    takes Lisinopril daily  . Joint pain   . Low back pain 01/25/2017  . Neck pain on left side 01/25/2017  . OSA (obstructive sleep apnea)   . Osteoarthritis   . Peripheral edema    takes Furosemide daily  . Peripheral neuropathy    takes Gabapentin daily  . Peroneal palsy    significant right foot drop  . Pneumonia 25+yrs ago   hx of  . Shortness of breath   . Swelling of extremity   . Urinary frequency   . Urinary urgency   . Valvular heart disease     Past Surgical History:  Procedure Laterality Date  . CARDIAC CATHETERIZATION N/A 05/02/2015   Procedure: Right/Left Heart Cath and Coronary Angiography;  Surgeon: Burnell Blanks, MD;  Location: [place] [facility];  Service: Cardiovascular;  Laterality: N/A;  .  CARDIOVERSION N/A 10/27/2018   Procedure: CARDIOVERSION;  Surgeon: Fay Records, MD;  Location: [facility] ENDOSCOPY;  Service: Cardiovascular;  Laterality: N/A;  . COLONOSCOPY N/A 02/07/2013   Procedure: COLONOSCOPY;  Surgeon: Juanita Craver, MD;  Location: [facility] ENDOSCOPY;  Service: Endoscopy;  Laterality: N/A;  . COLONOSCOPY N/A 02/09/2013   Procedure: COLONOSCOPY;  Surgeon: Beryle Beams, MD;  Location: [place];  Service: Endoscopy;  Laterality: N/A;  . ESOPHAGOGASTRODUODENOSCOPY N/A 02/07/2013   Procedure: ESOPHAGOGASTRODUODENOSCOPY (EGD);  Surgeon: Juanita Craver, MD;  Location: [facility] ENDOSCOPY;  Service: Endoscopy;  Laterality: N/A;  . GIVENS CAPSULE STUDY N/A 02/09/2013   Procedure: GIVENS CAPSULE STUDY;  Surgeon: Beryle Beams, MD;  Location: [place];  Service: Endoscopy;  Laterality: N/A;  . HERNIA REPAIR     umbilical hernia  . KNEE SURGERY Right    multiple knee surgeries due to complication of R TKA  . LAMINOTOMY  [id number]   c6-t2  . PACEMAKER IMPLANT N/A 04/27/2018   Procedure: PACEMAKER IMPLANT;  Surgeon: Constance Haw, MD;  Location: [place] [facility];  Service: Cardiovascular;  Laterality: N/A;  . SPINE SURGERY    . TEE WITHOUT CARDIOVERSION N/A 03/07/2015   Procedure: TRANSESOPHAGEAL ECHOCARDIOGRAM (TEE);  Surgeon: Josue Hector,  MD;  Location: [place];  Service: Cardiovascular;  Laterality: N/A;  ANES TO BRING PROPOFOL PER DOCTOR  . TEE WITHOUT CARDIOVERSION N/A 06/10/2015   Procedure: TRANSESOPHAGEAL ECHOCARDIOGRAM (TEE);  Surgeon: Burnell Blanks, MD;  Location: [place];  Service: Open Heart Surgery;  Laterality: N/A;  . TOTAL KNEE ARTHROPLASTY     right  . TRANSCATHETER AORTIC VALVE REPLACEMENT, TRANSFEMORAL Left 06/10/2015   Procedure: TRANSCATHETER AORTIC VALVE REPLACEMENT, TRANSFEMORAL;  Surgeon: Burnell Blanks, MD;  Location: [place];  Service: Open Heart Surgery;  Laterality: Left;    Family History  Problem Relation Age of Onset  . Other Father         POSSIBLE HEART ATTACK  . Arthritis Sister        "crippling"  . Arthritis Sister     Social History   Socioeconomic History  . Marital status: Married    Spouse name: [name]  . Number of children: 3  . Years of education: Not on file  . Highest education level: Not on file  Occupational History  . Occupation: retired Investment banker, operational  . Financial resource strain: Not on file  . Food insecurity:    Worry: Not on file    Inability: Not on file  . Transportation needs:    Medical: Not on file    Non-medical: Not on file  Tobacco Use  . Smoking status: Former Smoker    Years: 30.00    Types: Pipe    Last attempt to quit: 09/20/2006    Years since quitting: 12.1  . Smokeless tobacco: Never Used  Substance and Sexual Activity  . Alcohol use: No    Alcohol/week: 0.0 standard drinks  . Drug use: No  . Sexual activity: Never    Comment: lives with wife, retired, no dietary restrictions  Lifestyle  . Physical activity:    Days per week: Not on file    Minutes per session: Not on file  . Stress: Not on file  Relationships  . Social connections:    Talks on phone: Not on file    Gets together: Not on file    Attends religious service: Not on file    Active member of club or organization: Not on file    Attends meetings of clubs or organizations: Not on file    Relationship status: Not on file  . Intimate partner violence:    Fear of current or ex partner: Not on file    Emotionally abused: Not on file    Physically abused: Not on file    Forced sexual activity: Not on file  Other Topics Concern  . Not on file  Social History Narrative  . Not on file    Outpatient Medications Prior to Visit  Medication Sig Dispense Refill  . acetaminophen (TYLENOL) 500 MG tablet Take 1,000 mg by mouth 2 (two) times daily.     [name] ammonium lactate (LAC-HYDRIN) 12 % lotion Apply topically 3 (three) times daily. 400 g 0  . aspirin 81 MG EC tablet TAKE 1 TABLET (81 MG TOTAL) BY  MOUTH DAILY. 30 tablet 11  . atorvastatin (LIPITOR) 40 MG tablet Take 1 and a half tablet daily by mouth  (60mg ) (Patient taking differently: Take 60 mg by mouth daily. Take 1 and a half tablet daily by mouth  (60mg )) 135 tablet 5  . Coenzyme Q10 (COQ10 PO) Take 300 mg by mouth daily.     [name] ELDERBERRY PO Take 2 tablets by  mouth daily.     [name] ELIQUIS 5 MG TABS tablet TAKE 1 TABLET BY MOUTH TWICE A DAY (Patient taking differently: Take 5 mg by mouth 2 (two) times daily. ) 60 tablet 6  . Ferrous Fumarate-Folic Acid (HEMATINIC/FOLIC ACID) 237-6 MG TABS Take 1 tablet by mouth 2 (two) times a week. 90 each 1  . furosemide (LASIX) 40 MG tablet Take 1 tablet (40 mg total) by mouth daily. 30 tablet   . gabapentin (NEURONTIN) 300 MG capsule Take 1 capsule (300 mg total) by mouth 3 (three) times daily. 270 capsule 2  . hydrocerin (EUCERIN) CREA Apply 1 application topically 2 (two) times daily as needed (dry skin). 113 g 0  . hydrocortisone (ANUSOL-HC) 2.5 % rectal cream Place rectally 2 (two) times daily. 30 g 0  . Menthol, Topical Analgesic, (BENGAY EX) Apply 1 application topically at bedtime.    . Misc Natural Products (TART CHERRY ADVANCED PO) Take 1,200 mg by mouth daily.     . Multiple Vitamin (MULTIVITAMIN WITH MINERALS) TABS Take 1 tablet by mouth daily.    . polyethylene glycol (MIRALAX / GLYCOLAX) packet Take 17 g by mouth daily.    . sennosides-docusate sodium (SENOKOT-S) 8.6-50 MG tablet Take 1 tablet by mouth 2 (two) times daily.    . vitamin C (ASCORBIC ACID) 500 MG tablet Take 500 mg by mouth daily.    [name] VITAMIN D PO Take 5,000 Units by mouth daily.      No facility-administered medications prior to visit.     Allergies  Allergen Reactions  . Coumadin [Warfarin Sodium] Other (See Comments)    States he can't be on this-bleeds out    Review of Systems  Constitutional: Negative for fever and malaise/fatigue.  HENT: Negative for congestion.   Eyes: Negative for blurred vision.    Respiratory: Negative for shortness of breath.   Cardiovascular: Negative for chest pain, palpitations and leg swelling.  Gastrointestinal: Negative for abdominal pain, blood in stool and nausea.  Genitourinary: Negative for dysuria and frequency.  Musculoskeletal: Negative for falls.  Skin: Negative for rash.  Neurological: Negative for dizziness, loss of consciousness and headaches.  Endo/Heme/Allergies: Negative for environmental allergies.  Psychiatric/Behavioral: Negative for depression. The patient is not nervous/anxious.        Objective:    Physical Exam Vitals signs and nursing note reviewed.  Constitutional:      General: He is not in acute distress.    Appearance: He is well-developed.  HENT:     Head: Normocephalic and atraumatic.     Nose: Nose normal.  Eyes:     General:        Right eye: No discharge.        Left eye: No discharge.  Neck:     Musculoskeletal: Normal range of motion and neck supple.  Cardiovascular:     Rate and Rhythm: Normal rate and regular rhythm.     Heart sounds: No murmur.  Pulmonary:     Effort: Pulmonary effort is normal.     Breath sounds: Normal breath sounds.  Abdominal:     General: Bowel sounds are normal.     Palpations: Abdomen is soft.     Tenderness: There is no abdominal tenderness.  Musculoskeletal:     Right lower leg: Edema present.     Left lower leg: Edema present.  Skin:    General: Skin is warm and dry.  Neurological:     Mental Status: He is alert and oriented  to person, place, and time.     BP 130/60 (BP Location: Left Arm, Patient Position: Sitting, Cuff Size: Normal)   Pulse 65   Temp 98.2 F (36.8 C) (Oral)   Resp 18   Wt (!) 374 lb (169.6 kg)   SpO2 97%   BMI 48.02 kg/m  Wt Readings from Last 3 Encounters:  11/13/18 (!) 374 lb (169.6 kg)  11/04/18 (!) 362 lb 12.8 oz (164.6 kg)  10/06/18 (!) 374 lb (169.6 kg)     Lab Results  Component Value Date   WBC 7.7 10/31/2018   HGB 11.4 (L)  10/31/2018   HCT 38.3 (L) 10/31/2018   PLT 151 10/31/2018   GLUCOSE 105 (H) 11/09/2018   CHOL 140 08/22/2018   TRIG 126 08/22/2018   HDL 30 (L) 08/22/2018   LDLDIRECT 85.0 07/28/2017   LDLCALC 85 08/22/2018   ALT 18 06/29/2018   AST 18 06/29/2018   NA 140 11/09/2018   K 4.3 11/09/2018   CL 94 (L) 11/09/2018   CREATININE 1.30 (H) 11/09/2018   BUN 37 (H) 11/09/2018   CO2 36 (H) 11/09/2018   TSH 1.71 11/24/2017   INR 1.32 06/10/2015   HGBA1C 5.6 08/22/2018    Lab Results  Component Value Date   TSH 1.71 11/24/2017   Lab Results  Component Value Date   WBC 7.7 10/31/2018   HGB 11.4 (L) 10/31/2018   HCT 38.3 (L) 10/31/2018   MCV 99.2 10/31/2018   PLT 151 10/31/2018   Lab Results  Component Value Date   NA 140 11/09/2018   K 4.3 11/09/2018   CO2 36 (H) 11/09/2018   GLUCOSE 105 (H) 11/09/2018   BUN 37 (H) 11/09/2018   CREATININE 1.30 (H) 11/09/2018   BILITOT 0.4 06/29/2018   ALKPHOS 76 06/29/2018   AST 18 06/29/2018   ALT 18 06/29/2018   PROT 6.3 06/29/2018   ALBUMIN 3.7 06/29/2018   CALCIUM 9.4 11/09/2018   ANIONGAP 10 11/09/2018   GFR 71.43 06/29/2018   Lab Results  Component Value Date   CHOL 140 08/22/2018   Lab Results  Component Value Date   HDL 30 (L) 08/22/2018   Lab Results  Component Value Date   LDLCALC 85 08/22/2018   Lab Results  Component Value Date   TRIG 126 08/22/2018   Lab Results  Component Value Date   CHOLHDL 4 11/24/2017   Lab Results  Component Value Date   HGBA1C 5.6 08/22/2018       Assessment & Plan:   Problem List Items Addressed This Visit    HYPERCHOLESTEROLEMIA    Encouraged heart healthy diet, increase exercise, avoid trans fats, consider a krill oil cap daily      Hyperglycemia     minimize simple carbs. Increase exercise as tolerated.       Essential hypertension    Well controlled, no changes to meds. Encouraged heart healthy diet such as the DASH diet and exercise as tolerated.       Renal  insufficiency    Hydrate and monitor      Persistent atrial fibrillation    Rate controlled and tolerating Eliquis         I am having Alarik D. Shoemaker maintain his VITAMIN D PO, sennosides-docusate sodium, multivitamin with minerals, acetaminophen, Coenzyme Q10 (COQ10 PO), polyethylene glycol, aspirin, Misc Natural Products (TART CHERRY ADVANCED PO), atorvastatin, gabapentin, Ferrous Fumarate-Folic Acid, ELDERBERRY PO, vitamin C, ELIQUIS, (Menthol, Topical Analgesic, (BENGAY EX)), ammonium lactate, hydrocerin, hydrocortisone, and furosemide.  No orders of the defined types were placed in this encounter.    Penni Homans, MD

## 2018-11-13 NOTE — Assessment & Plan Note (Signed)
Well controlled, no changes to meds. Encouraged heart healthy diet such as the DASH diet and exercise as tolerated.  °

## 2018-11-14 ENCOUNTER — Ambulatory Visit: Payer: PPO | Admitting: Physician Assistant

## 2018-11-14 NOTE — Progress Notes (Signed)
Remote pacemaker transmission.   

## 2018-11-15 ENCOUNTER — Telehealth (HOSPITAL_COMMUNITY): Payer: Self-pay | Admitting: *Deleted

## 2018-11-15 ENCOUNTER — Ambulatory Visit (INDEPENDENT_AMBULATORY_CARE_PROVIDER_SITE_OTHER): Payer: PPO | Admitting: Family Medicine

## 2018-11-15 ENCOUNTER — Encounter: Payer: Self-pay | Admitting: Family Medicine

## 2018-11-15 ENCOUNTER — Ambulatory Visit: Payer: PPO | Admitting: Sports Medicine

## 2018-11-15 VITALS — BP 130/68 | HR 58 | Temp 98.4°F | Ht 74.0 in | Wt 340.0 lb

## 2018-11-15 DIAGNOSIS — R310 Gross hematuria: Secondary | ICD-10-CM | POA: Diagnosis not present

## 2018-11-15 DIAGNOSIS — R300 Dysuria: Secondary | ICD-10-CM

## 2018-11-15 MED ORDER — CEPHALEXIN 500 MG PO CAPS: 500.0000 mg | ORAL_CAPSULE | Freq: Two times a day (BID) | ORAL | 0 refills | Status: AC

## 2018-11-15 NOTE — Telephone Encounter (Signed)
Patient called in stating he noticed blood in urine this morning. He does endorse burning with urination. Instructed pt to contact PCP for urinalysis and workup. Pt verbalized understanding - reminded to he cannot stop/interrupt eliquis for 4 weeks post cardioversion.  Pt will contact PCP today.

## 2018-11-15 NOTE — Progress Notes (Signed)
Chief Complaint  Patient presents with  . Hematuria    urine odor    Subjective: Patient is a 78 y.o. male here for blood in urine.  Had been having foul odor and burning with urination over past several days. Had been in hosp for CHF exacerbation and was on Lasix. No Foley, condom catheter. No abd pain, fevers, flank pain, and discharge. +hx of UTI that does present like this. He is having blood starting today. No injury. He is on Eliquis.   ROS: GU: As noted in HPI  Past Medical History:  Diagnosis Date  . Anemia   . Aortic stenosis   . Aortic stenosis, severe    S/p Edwards Sapien 3 Transcatheter Heart Valve (size 26 mm, model # [id number], serial # [id number])  . Arthritis   . Back pain   . Bursitis   . Cataract    left immature  . Cellulitis 10/12/2015  . Complication of anesthesia    Halucinations  . Constipation    takes Miralax daily as well as Senokot daily  . DDD (degenerative disc disease)   . Gout    takes Allopurinol daily  . Heart valve disorder   . History of blood clots 1962   knee  . History of blood transfusion    no abnormal reaction noted  . History of shingles   . Hyperlipidemia    takes Atorvastatin daily  . Hypertension    takes Lisinopril daily  . Joint pain   . Low back pain 01/25/2017  . Neck pain on left side 01/25/2017  . OSA (obstructive sleep apnea)   . Osteoarthritis   . Peripheral edema    takes Furosemide daily  . Peripheral neuropathy    takes Gabapentin daily  . Peroneal palsy    significant right foot drop  . Pneumonia 25+yrs ago   hx of  . Shortness of breath   . Swelling of extremity   . Urinary frequency   . Urinary urgency   . Valvular heart disease     Objective: BP 130/68 (BP Location: Left Arm, Patient Position: Sitting, Cuff Size: Normal)   Pulse (!) 58   Temp 98.4 F (36.9 C) (Oral)   Ht 6\' 2"  (1.88 m)   Wt (!) 340 lb (154.2 kg)   SpO2 93%   BMI 43.65 kg/m  General: Awake, appears stated age HEENT:  MMM Heart: RRR Lungs: CTAB, no rales, wheezes or rhonchi. No accessory muscle use Abd: Large panniculus making for difficult exam, NT, ND MSK: No ttp over cva Psych: Age appropriate judgment and insight, normal affect and mood  Assessment and Plan: Gross hematuria - Plan: cephALEXin (KEFLEX) 500 MG capsule, Ambulatory referral to Urology, Urine Culture  Dysuria - Plan: cephALEXin (KEFLEX) 500 MG capsule, Urine Culture  Tx for UTI. Keflex did well in past. Will try to culture, UA invalid due to blood. I will place urgent referral with hope of him being seen next week. If he improves w abx, cancel appt. Cannot stop Eliquis.  F/u prn. The patient voiced understanding and agreement to the plan.  [place], DO 11/15/18  4:07 PM

## 2018-11-15 NOTE — Patient Instructions (Addendum)
Someone should reach out to you to schedule a urology appointment. I ideally want you seen next Wednesday or Tuesday.   Stay hydrated.   Warning signs/symptoms: Uncontrollable nausea/vomiting, fevers, worsening symptoms despite treatment, confusion.  Give [name] around 2 business days to get culture back to you. It may have too much blood.    Let us know if you need anything.

## 2018-11-16 ENCOUNTER — Ambulatory Visit: Payer: PPO | Admitting: Family Medicine

## 2018-11-17 ENCOUNTER — Telehealth: Payer: Self-pay | Admitting: Family Medicine

## 2018-11-17 LAB — URINE CULTURE
MICRO NUMBER:: 245506
SPECIMEN QUALITY:: ADEQUATE

## 2018-11-17 NOTE — Telephone Encounter (Signed)
Ok will do.

## 2018-11-17 NOTE — Telephone Encounter (Signed)
Copied from [place] [id number]. Topic: General - Other >> Nov 17, 2018 11:25 AM Keene Breath wrote: Reason for CRM: Patient called to inform Dr. Nani Ravens that since his visit earlier in the week with a UTI, he is feeling much better and everything has gone back to normal.  If there are any questions, please call patient back at [phone]

## 2018-11-17 NOTE — Telephone Encounter (Signed)
Informed wife of instructions//canceled referral//

## 2018-11-17 NOTE — Addendum Note (Signed)
Addended by: Sharon Seller B on: 11/17/2018 03:13 PM   Modules accepted: Orders

## 2018-11-17 NOTE — Telephone Encounter (Signed)
Good to know. We can cancel urology appointment if already scheduled. TY.

## 2018-11-18 DIAGNOSIS — G4733 Obstructive sleep apnea (adult) (pediatric): Secondary | ICD-10-CM | POA: Diagnosis not present

## 2018-11-18 DIAGNOSIS — R0602 Shortness of breath: Secondary | ICD-10-CM | POA: Diagnosis not present

## 2018-11-20 DIAGNOSIS — J9622 Acute and chronic respiratory failure with hypercapnia: Secondary | ICD-10-CM | POA: Diagnosis not present

## 2018-11-20 DIAGNOSIS — J449 Chronic obstructive pulmonary disease, unspecified: Secondary | ICD-10-CM | POA: Diagnosis not present

## 2018-11-20 DIAGNOSIS — G4733 Obstructive sleep apnea (adult) (pediatric): Secondary | ICD-10-CM | POA: Diagnosis not present

## 2018-11-20 DIAGNOSIS — R062 Wheezing: Secondary | ICD-10-CM | POA: Diagnosis not present

## 2018-11-22 DIAGNOSIS — I480 Paroxysmal atrial fibrillation: Secondary | ICD-10-CM | POA: Diagnosis not present

## 2018-11-22 DIAGNOSIS — J449 Chronic obstructive pulmonary disease, unspecified: Secondary | ICD-10-CM | POA: Diagnosis not present

## 2018-11-22 DIAGNOSIS — G5730 Lesion of lateral popliteal nerve, unspecified lower limb: Secondary | ICD-10-CM | POA: Diagnosis not present

## 2018-11-22 DIAGNOSIS — I110 Hypertensive heart disease with heart failure: Secondary | ICD-10-CM | POA: Diagnosis not present

## 2018-11-22 DIAGNOSIS — J069 Acute upper respiratory infection, unspecified: Secondary | ICD-10-CM | POA: Diagnosis not present

## 2018-11-22 DIAGNOSIS — M109 Gout, unspecified: Secondary | ICD-10-CM | POA: Diagnosis not present

## 2018-11-22 DIAGNOSIS — G629 Polyneuropathy, unspecified: Secondary | ICD-10-CM | POA: Diagnosis not present

## 2018-11-22 DIAGNOSIS — K5900 Constipation, unspecified: Secondary | ICD-10-CM | POA: Diagnosis not present

## 2018-11-22 DIAGNOSIS — I739 Peripheral vascular disease, unspecified: Secondary | ICD-10-CM | POA: Diagnosis not present

## 2018-11-22 DIAGNOSIS — Z7982 Long term (current) use of aspirin: Secondary | ICD-10-CM | POA: Diagnosis not present

## 2018-11-22 DIAGNOSIS — J111 Influenza due to unidentified influenza virus with other respiratory manifestations: Secondary | ICD-10-CM | POA: Diagnosis not present

## 2018-11-22 DIAGNOSIS — R350 Frequency of micturition: Secondary | ICD-10-CM | POA: Diagnosis not present

## 2018-11-22 DIAGNOSIS — H269 Unspecified cataract: Secondary | ICD-10-CM | POA: Diagnosis not present

## 2018-11-22 DIAGNOSIS — I4892 Unspecified atrial flutter: Secondary | ICD-10-CM | POA: Diagnosis not present

## 2018-11-22 DIAGNOSIS — M519 Unspecified thoracic, thoracolumbar and lumbosacral intervertebral disc disorder: Secondary | ICD-10-CM | POA: Diagnosis not present

## 2018-11-22 DIAGNOSIS — I050 Rheumatic mitral stenosis: Secondary | ICD-10-CM | POA: Diagnosis not present

## 2018-11-22 DIAGNOSIS — I5033 Acute on chronic diastolic (congestive) heart failure: Secondary | ICD-10-CM | POA: Diagnosis not present

## 2018-11-22 DIAGNOSIS — R3915 Urgency of urination: Secondary | ICD-10-CM | POA: Diagnosis not present

## 2018-11-22 DIAGNOSIS — E785 Hyperlipidemia, unspecified: Secondary | ICD-10-CM | POA: Diagnosis not present

## 2018-11-22 DIAGNOSIS — Z7901 Long term (current) use of anticoagulants: Secondary | ICD-10-CM | POA: Diagnosis not present

## 2018-11-22 DIAGNOSIS — I872 Venous insufficiency (chronic) (peripheral): Secondary | ICD-10-CM | POA: Diagnosis not present

## 2018-11-22 DIAGNOSIS — D649 Anemia, unspecified: Secondary | ICD-10-CM | POA: Diagnosis not present

## 2018-11-22 DIAGNOSIS — G4733 Obstructive sleep apnea (adult) (pediatric): Secondary | ICD-10-CM | POA: Diagnosis not present

## 2018-11-23 ENCOUNTER — Ambulatory Visit: Payer: Self-pay

## 2018-11-23 ENCOUNTER — Ambulatory Visit: Payer: PPO | Admitting: Sports Medicine

## 2018-11-23 VITALS — BP 132/64 | HR 53 | Wt 345.0 lb

## 2018-11-23 DIAGNOSIS — G8929 Other chronic pain: Secondary | ICD-10-CM | POA: Diagnosis not present

## 2018-11-23 DIAGNOSIS — M19012 Primary osteoarthritis, left shoulder: Secondary | ICD-10-CM

## 2018-11-23 DIAGNOSIS — M25511 Pain in right shoulder: Secondary | ICD-10-CM

## 2018-11-23 DIAGNOSIS — M25512 Pain in left shoulder: Secondary | ICD-10-CM

## 2018-11-23 NOTE — Progress Notes (Signed)
[name]. [place], [facility] - 78 y.o. male MRN [id number]  Date of birth: [birth year]  Visit Date: November 26, 2018  PCP: [name], MD   Referred by: [name], MD  SUBJECTIVE:  Chief Complaint  Patient presents with  . Follow-up    L shoulder pain.  L ACJ injection - 08/23/18.  B shld injections - 06/14/18.  Gabapentin tid.  BenGay.  Walker    HPI: Patient presents with persistent bilateral shoulder pain left worse than right.  Is now radiating into the upper back on the left side over the last 2 weeks.  He has done well with the injections previously and is requesting repeat injection today.  He does have exacerbations of his symptoms with using his walker.  He is wheelchair dependent for the majority of the time.  Previously underwent injections on 06/14/2018 with intra-articular injections and left AC joint injection on 08/23/2018.  The symptoms do seem more similar to the September injections at this time  REVIEW OF SYSTEMS: No significant nighttime awakenings due to this issue. Denies fevers, chills, recent weight gain or weight loss.  No night sweats.  Per HPI  HISTORY:  Prior history reviewed and updated per electronic medical record.  Patient Active Problem List   Diagnosis Date Noted  . Hypoxia 10/27/2018  . Persistent atrial fibrillation 10/17/2018  . Primary osteoarthritis of left shoulder 08/23/2018  . Renal insufficiency 06/29/2018  . Bradycardia 04/26/2018  . Other fatigue 12/06/2017  . Shortness of breath on exertion 12/06/2017  . Essential hypertension 12/06/2017  . Hyperglycemia 07/28/2017  . Neck pain on left side 01/25/2017  . Low back pain 01/25/2017  . Constipation 08/15/2015  . Severe aortic valve stenosis     S/p TAVR   . Medicare annual wellness visit, subsequent 03/09/2015    Follows with Dr Sabra Heck of opthamology Dr Benson Norway of gastroenterology Dr Annamaria Boots of  Pulmonology Dr [name] of cardiology   . Preventative health care 03/09/2015  . Allergic rhinitis 12/02/2014  . COPD mixed type ([place]) 12/02/2014    PFT 05/05/2015-severe obstructive airways disease, insignificant response to bronchodilator, severe restriction, moderate diffusion defect FVC 2.17/46%, FEV1 1.50/44%, ratio 0.69, TLC 64%, DLCO 57% with volume correction to 62% of predicted   . Chronic venous insufficiency 10/02/2013  . Venous stasis dermatitis 10/02/2013  . AVM (arteriovenous malformation) of colon with hemorrhage 02/10/2013  . Thrombocytopenia, unspecified ([place]) 02/09/2013  . EDEMA 04/15/2010    Qualifier: Diagnosis of  By: [name], MD, Rona Ravens    . HYPERCHOLESTEROLEMIA 04/14/2010    Qualifier: Diagnosis of  By: [name], MD, Rona Ravens    . Gout 04/14/2010    Qualifier: Diagnosis of  By: [name], MD, Rona Ravens    . Anemia 04/14/2010    Qualifier: Diagnosis of  By: [name], MD, Rona Ravens    . Aortic valve disorder 04/14/2010    Qualifier: Diagnosis of  By: [name], MD, Rona Ravens    . VALVULAR HEART DISEASE 04/14/2010    Qualifier: Diagnosis of  By: [name], MD, Rona Ravens    . Osteoarthritis 04/14/2010    Qualifier: Diagnosis of  By: [name], MD, Rona Ravens    . Multilevel degenerative disc disease 04/14/2010    Qualifier: Diagnosis of  By: [name], MD, Rona Ravens    . BURSITIS 04/14/2010    Qualifier: Diagnosis of  ByJohnsie Cancel, MD, Rona Ravens    . OBESITY, MORBID 03/21/2010    Qualifier: Diagnosis of  By: Annamaria Boots MD, Clinton D    . Obstructive sleep apnea 02/19/2008    NPSG 06/21/1985 AHI 98 per hour CPAP 18/Advanced CPAP titration study 03/2016 >Optimal PAP Pressure (cm):  22   AHI at Optimal Pressure  2.4      Social History   Occupational History  . Occupation: retired Nurse, mental health  . Smoking status: Former Smoker    Years: 30.00     Types: Pipe    Last attempt to quit: 09/20/2006    Years since quitting: 12.1  . Smokeless tobacco: Never Used  Substance and Sexual Activity  . Alcohol use: No    Alcohol/week: 0.0 standard drinks  . Drug use: No  . Sexual activity: Never    Comment: lives with wife, retired, no dietary restrictions   Social History   Social History Narrative  . Not on file    OBJECTIVE:  VS:  HT:    WT:(!) 345 lb (156.5 kg)  BMI:44.28    BP:132/64  HR:(!) 53bpm  TEMP: ( )  RESP:95 %   PHYSICAL EXAM: Adult male. No acute distress.  Alert and appropriate. Bilateral shoulders are well aligned but he has significant limitations in his range of motion.  Left shoulder has marked pain with axial load and circumduction.  Degenerative bossing appreciated.  Mild pain over the left AC joint.   ASSESSMENT:  1. Chronic pain of both shoulders   2. Primary osteoarthritis of left shoulder     PROCEDURES:  US Guided Injection per procedure note      PLAN:  Pertinent additional documentation may be included in corresponding procedure notes, imaging studies, problem based documentation and patient instructions.  No problem-specific Assessment & Plan notes found for this encounter.   Moderate left shoulder effusion today.  Should do well with intra-articular injection.  Follow-up in 10 weeks for repeat injection.  Activity modifications and the importance of avoiding exacerbating activities (limiting pain to no more than a 4 / 10 during or following activity) recommended and discussed.  Discussed red flag symptoms that warrant earlier emergent evaluation and patient voices understanding.   No orders of the defined types were placed in this encounter.  Lab Orders  No laboratory test(s) ordered today    Imaging Orders     [name] [facility] POCT ULTRASOUND Referral Orders  No referral(s) requested today    Return in about 10 weeks (around 02/01/2019).          Gerda Diss, [facility]  Medicine Physician

## 2018-11-23 NOTE — Procedures (Signed)
PROCEDURE NOTE:  Ultrasound Guided: Injection: Left shoulder, Intra-articular Images were obtained and interpreted by myself, Teresa Coombs, DO  Images have been saved and stored to PACS system. Images obtained on: GE S7 Ultrasound machine    ULTRASOUND FINDINGS:  Marked degenerative changes.  Large effusion.  DESCRIPTION OF PROCEDURE:  The patient's clinical condition is marked by substantial pain and/or significant functional disability. Other conservative therapy has not provided relief, is contraindicated, or not appropriate. There is a reasonable likelihood that injection will significantly improve the patient's pain and/or functional impairment.   After discussing the risks, benefits and expected outcomes of the injection and all questions were reviewed and answered, the patient wished to undergo the above named procedure.  Verbal consent was obtained.  The ultrasound was used to identify the target structure and adjacent neurovascular structures. The skin was then prepped in sterile fashion and the target structure was injected under direct visualization using sterile technique as below:  Single injection performed as below: PREP: Alcohol and Ethel Chloride APPROACH:posterior, single injection, 21g 2 in. INJECTATE: 2 cc 0.5% Marcaine and 1 cc 40mg /mL DepoMedrol ASPIRATE: None DRESSING: Band-Aid  Post procedural instructions including recommending icing and warning signs for infection were reviewed.    This procedure was well tolerated and there were no complications.   IMPRESSION: Succesful Ultrasound Guided: Injection

## 2018-11-23 NOTE — Patient Instructions (Signed)

## 2018-11-24 DIAGNOSIS — G4733 Obstructive sleep apnea (adult) (pediatric): Secondary | ICD-10-CM | POA: Diagnosis not present

## 2018-11-24 DIAGNOSIS — J449 Chronic obstructive pulmonary disease, unspecified: Secondary | ICD-10-CM | POA: Diagnosis not present

## 2018-11-26 ENCOUNTER — Other Ambulatory Visit (HOSPITAL_COMMUNITY): Payer: Self-pay | Admitting: Family Medicine

## 2018-11-26 ENCOUNTER — Encounter: Payer: Self-pay | Admitting: Sports Medicine

## 2018-11-28 ENCOUNTER — Telehealth: Payer: Self-pay | Admitting: Family Medicine

## 2018-11-28 ENCOUNTER — Ambulatory Visit (INDEPENDENT_AMBULATORY_CARE_PROVIDER_SITE_OTHER): Payer: PPO | Admitting: Medical

## 2018-11-28 VITALS — BP 131/59 | HR 68 | Temp 98.4°F | Resp 18

## 2018-11-28 DIAGNOSIS — R300 Dysuria: Secondary | ICD-10-CM

## 2018-11-28 DIAGNOSIS — N419 Inflammatory disease of prostate, unspecified: Secondary | ICD-10-CM

## 2018-11-28 DIAGNOSIS — R350 Frequency of micturition: Secondary | ICD-10-CM

## 2018-11-28 DIAGNOSIS — R944 Abnormal results of kidney function studies: Secondary | ICD-10-CM

## 2018-11-28 LAB — POCT URINALYSIS DIP (MANUAL ENTRY)
Bilirubin, UA: NEGATIVE
Glucose, UA: NEGATIVE mg/dL
Ketones, POC UA: NEGATIVE mg/dL
Nitrite, UA: NEGATIVE
Protein Ur, POC: 30 mg/dL — AB
Spec Grav, UA: <=1.005 — AB (ref 1.010–1.025)
Urobilinogen, UA: 0.2 E.U./dL
pH, UA: 8.5 — AB (ref 5.0–8.0)

## 2018-11-28 LAB — COMPREHENSIVE METABOLIC PANEL
ALT: 28 U/L (ref 0–53)
AST: 23 U/L (ref 0–37)
Albumin: 3.7 g/dL (ref 3.5–5.2)
Alkaline Phosphatase: 74 U/L (ref 39–117)
BUN: 32 mg/dL — ABNORMAL HIGH (ref 6–23)
CO2: 32 mEq/L (ref 19–32)
Calcium: 9.1 mg/dL (ref 8.4–10.5)
Chloride: 100 mEq/L (ref 96–112)
Creatinine, Ser: 1.09 mg/dL (ref 0.40–1.50)
GFR: 65.72 mL/min (ref >60.00)
Glucose, Bld: 89 mg/dL (ref 70–99)
Potassium: 4.4 mEq/L (ref 3.5–5.1)
Sodium: 139 mEq/L (ref 135–145)
Total Bilirubin: 0.4 mg/dL (ref 0.2–1.2)
Total Protein: 6 g/dL (ref 6.0–8.3)

## 2018-11-28 LAB — PSA: PSA: 22.91 ng/mL — ABNORMAL HIGH (ref 0.10–4.00)

## 2018-11-28 MED ORDER — FUROSEMIDE 40 MG PO TABS: 40.0000 mg | ORAL_TABLET | Freq: Every day | ORAL | 0 refills | Status: DC

## 2018-11-28 MED ORDER — SULFAMETHOXAZOLE-TRIMETHOPRIM 800-160 MG PO TABS: 1.0000 | ORAL_TABLET | Freq: Two times a day (BID) | ORAL | 0 refills | Status: DC

## 2018-11-28 NOTE — Progress Notes (Signed)
Subjective:    Patient ID: [name], male    DOB: [birth year], 78 y.o.   MRN: [id number]  HPI   Pt in for evaluation.  Pt has history of uti in past. Pt came in last week with gross hematuria but only gave very small urine sample. By night of sept 26, 2020 gross blood cleared. He was on keflex for 7 days and ran out of meds on March 4. He states was doing up until yesterday when he noticed smell again to urine and some burning. He still has blood on UA. Also also has leukocytes. Pt is on eliquis.  Last year he had uti as well.  Dr. Nani Ravens put in referral to urologist. They called him but patient cancelled the appointment.    Review of Systems  Constitutional: Negative for chills, fatigue and fever.  Respiratory: Negative for cough, chest tightness, shortness of breath and wheezing.   Cardiovascular: Negative for chest pain and palpitations.  Genitourinary: Positive for dysuria and frequency.  Musculoskeletal: Negative for back pain, myalgias and neck pain.  Neurological: Negative for dizziness, seizures, speech difficulty, weakness and headaches.  Hematological: Negative for adenopathy. Does not bruise/bleed easily.  Psychiatric/Behavioral: Negative for behavioral problems, confusion and decreased concentration.    Past Medical History:  Diagnosis Date  . Anemia   . Aortic stenosis   . Aortic stenosis, severe    S/p Edwards Sapien 3 Transcatheter Heart Valve (size 26 mm, model # [id number], serial # [id number])  . Arthritis   . Back pain   . Bursitis   . Cataract    left immature  . Cellulitis 10/12/2015  . Complication of anesthesia    Halucinations  . Constipation    takes Miralax daily as well as Senokot daily  . DDD (degenerative disc disease)   . Gout    takes Allopurinol daily  . Heart valve disorder   . History of blood clots 1962   knee  . History of blood transfusion    no abnormal reaction noted  . History of shingles   . Hyperlipidemia    takes  Atorvastatin daily  . Hypertension    takes Lisinopril daily  . Joint pain   . Low back pain 01/25/2017  . Neck pain on left side 01/25/2017  . OSA (obstructive sleep apnea)   . Osteoarthritis   . Peripheral edema    takes Furosemide daily  . Peripheral neuropathy    takes Gabapentin daily  . Peroneal palsy    significant right foot drop  . Pneumonia 25+yrs ago   hx of  . Shortness of breath   . Swelling of extremity   . Urinary frequency   . Urinary urgency   . Valvular heart disease      Social History   Socioeconomic History  . Marital status: Married    Spouse name: [name]  . Number of children: 3  . Years of education: Not on file  . Highest education level: Not on file  Occupational History  . Occupation: retired Investment banker, operational  . Financial resource strain: Not on file  . Food insecurity:    Worry: Not on file    Inability: Not on file  . Transportation needs:    Medical: Not on file    Non-medical: Not on file  Tobacco Use  . Smoking status: Former Smoker    Years: 30.00    Types: Pipe    Last attempt to quit: 09/20/2006  Years since quitting: 12.1  . Smokeless tobacco: Never Used  Substance and Sexual Activity  . Alcohol use: No    Alcohol/week: 0.0 standard drinks  . Drug use: No  . Sexual activity: Never    Comment: lives with wife, retired, no dietary restrictions  Lifestyle  . Physical activity:    Days per week: Not on file    Minutes per session: Not on file  . Stress: Not on file  Relationships  . Social connections:    Talks on phone: Not on file    Gets together: Not on file    Attends religious service: Not on file    Active member of club or organization: Not on file    Attends meetings of clubs or organizations: Not on file    Relationship status: Not on file  . Intimate partner violence:    Fear of current or ex partner: Not on file    Emotionally abused: Not on file    Physically abused: Not on file    Forced sexual  activity: Not on file  Other Topics Concern  . Not on file  Social History Narrative  . Not on file    Past Surgical History:  Procedure Laterality Date  . CARDIAC CATHETERIZATION N/A 05/02/2015   Procedure: Right/Left Heart Cath and Coronary Angiography;  Surgeon: Burnell Blanks, MD;  Location: [place] [facility];  Service: Cardiovascular;  Laterality: N/A;  . CARDIOVERSION N/A 10/27/2018   Procedure: CARDIOVERSION;  Surgeon: Fay Records, MD;  Location[facility] ENDOSCOPY;  Service: Cardiovascular;  Laterality: N/A;  . COLONOSCOPY N/A 02/07/2013   Procedure: COLONOSCOPY;  Surgeon: Juanita Craver, MD;  Location[facility] ENDOSCOPY;  Service: Endoscopy;  Laterality: N/A;  . COLONOSCOPY N/A 02/09/2013   Procedure: COLONOSCOPY;  Surgeon: Beryle Beams, MD;  Location: [place];  Service: Endoscopy;  Laterality: N/A;  . ESOPHAGOGASTRODUODENOSCOPY N/A 02/07/2013   Procedure: ESOPHAGOGASTRODUODENOSCOPY (EGD);  Surgeon: Juanita Craver, MD;  Location: [facility] ENDOSCOPY;  Service: Endoscopy;  Laterality: N/A;  . GIVENS CAPSULE STUDY N/A 02/09/2013   Procedure: GIVENS CAPSULE STUDY;  Surgeon: Beryle Beams, MD;  Location: [place];  Service: Endoscopy;  Laterality: N/A;  . HERNIA REPAIR     umbilical hernia  . KNEE SURGERY Right    multiple knee surgeries due to complication of R TKA  . LAMINOTOMY  6468   c6-t2  . PACEMAKER IMPLANT N/A 04/27/2018   Procedure: PACEMAKER IMPLANT;  Surgeon: Constance Haw, MD;  Location: [place] [facility];  Service: Cardiovascular;  Laterality: N/A;  . SPINE SURGERY    . TEE WITHOUT CARDIOVERSION N/A 03/07/2015   Procedure: TRANSESOPHAGEAL ECHOCARDIOGRAM (TEE);  Surgeon: Josue Hector, MD;  Location: [facility] ENDOSCOPY;  Service: Cardiovascular;  Laterality: N/A;  ANES TO BRING PROPOFOL PER DOCTOR  . TEE WITHOUT CARDIOVERSION N/A 06/10/2015   Procedure: TRANSESOPHAGEAL ECHOCARDIOGRAM (TEE);  Surgeon: Burnell Blanks, MD;  Location: [place];  Service: Open Heart Surgery;   Laterality: N/A;  . TOTAL KNEE ARTHROPLASTY     right  . TRANSCATHETER AORTIC VALVE REPLACEMENT, TRANSFEMORAL Left 06/10/2015   Procedure: TRANSCATHETER AORTIC VALVE REPLACEMENT, TRANSFEMORAL;  Surgeon: Burnell Blanks, MD;  Location: [place];  Service: Open Heart Surgery;  Laterality: Left;    Family History  Problem Relation Age of Onset  . Other Father        POSSIBLE HEART ATTACK  . Arthritis Sister        "crippling"  . Arthritis Sister  Allergies  Allergen Reactions  . Coumadin [Warfarin Sodium] Other (See Comments)    States he can't be on this-bleeds out    Current Outpatient Medications on File Prior to Visit  Medication Sig Dispense Refill  . acetaminophen (TYLENOL) 500 MG tablet Take 1,000 mg by mouth 2 (two) times daily.     [name] ammonium lactate (LAC-HYDRIN) 12 % lotion Apply topically 3 (three) times daily. 400 g 0  . aspirin 81 MG EC tablet TAKE 1 TABLET (81 MG TOTAL) BY MOUTH DAILY. 30 tablet 11  . atorvastatin (LIPITOR) 40 MG tablet Take 1 and a half tablet daily by mouth  (60mg ) (Patient taking differently: Take 60 mg by mouth daily. Take 1 and a half tablet daily by mouth  (60mg )) 135 tablet 5  . Coenzyme Q10 (COQ10 PO) Take 300 mg by mouth daily.     [name] ELDERBERRY PO Take 2 tablets by mouth daily.     [name] ELIQUIS 5 MG TABS tablet TAKE 1 TABLET BY MOUTH TWICE A DAY (Patient taking differently: Take 5 mg by mouth 2 (two) times daily. ) 60 tablet 6  . Ferrous Fumarate-Folic Acid (HEMATINIC/FOLIC ACID) 956-2 MG TABS Take 1 tablet by mouth 2 (two) times a week. 90 each 1  . gabapentin (NEURONTIN) 300 MG capsule Take 1 capsule (300 mg total) by mouth 3 (three) times daily. 270 capsule 2  . hydrocerin (EUCERIN) CREA Apply 1 application topically 2 (two) times daily as needed (dry skin). 113 g 0  . hydrocortisone (ANUSOL-HC) 2.5 % rectal cream Place rectally 2 (two) times daily. 30 g 0  . Menthol, Topical Analgesic, (BENGAY EX) Apply 1 application topically at bedtime.     . Misc Natural Products (TART CHERRY ADVANCED PO) Take 1,200 mg by mouth daily.     . Multiple Vitamin (MULTIVITAMIN WITH MINERALS) TABS Take 1 tablet by mouth daily.    . polyethylene glycol (MIRALAX / GLYCOLAX) packet Take 17 g by mouth daily.    . sennosides-docusate sodium (SENOKOT-S) 8.6-50 MG tablet Take 1 tablet by mouth 2 (two) times daily.    . vitamin C (ASCORBIC ACID) 500 MG tablet Take 500 mg by mouth daily.    [name] VITAMIN D PO Take 5,000 Units by mouth daily.      No current facility-administered medications on file prior to visit.     BP (!) 131/59   Pulse 68   Temp 98.4 F (36.9 C) (Oral)   Resp 18   SpO2 96%       Objective:   Physical Exam  General Appearance- Not in acute distress.  HEENT Eyes- Scleraeral/Conjuntiva-bilat- Not Yellow. Mouth & Throat- Normal.  Chest and Lung Exam Auscultation: Breath sounds:-Normal. Adventitious sounds:- No Adventitious sounds.  Cardiovascular Auscultation:Rythm - Regular. Heart Sounds -Normal heart sounds.  Abdomen Inspection:-Inspection Normal.  Palpation/Perucssion: Palpation and Percussion of the abdomen reveal- Non Tender, No Rebound tenderness, No rigidity(Guarding) and No Palpable abdominal masses.  Liver:-Normal.  Spleen:- Normal.   Back- no cva tenderness.  Lower ext- no pedal edema. Negative homan sign.      Assessment & Plan:  You do appear to have recurrent UTI with possible prostatitis.  Some blood on UA today as well.  However the blood is not gross blood as it was last time when you were in with Dr. Nani Ravens.  Will give you Bactrim DS antibiotic to take twice daily for 10 days.  Will send urine culture out today.  Also will get CMP and PSA.  I went ahead and put in the referral to urologist again as your symptoms reoccurred after being off antibiotics for a brief period of time.  Follow-up will be with [name] in 7 to 10 days if urology office is not get you in within that timeframe.  Follow-up with  [name] sooner if your symptoms worsen or change.  Any severe change in signs symptoms and recommend ED evaluation.

## 2018-11-28 NOTE — Telephone Encounter (Signed)
ROI fax to [place] for records

## 2018-11-28 NOTE — Patient Instructions (Signed)
You do appear to have recurrent UTI with possible prostatitis.  Some blood on UA today as well.  However the blood is not gross blood as it was last time when you were in with Dr. Nani Ravens.  Will give you Bactrim DS antibiotic to take twice daily for 10 days.  Will send urine culture out today.  Also will get CMP and PSA.  I went ahead and put in the referral to urologist again as your symptoms reoccurred after being off antibiotics for a brief period of time.  Follow-up will be with [name] in 7 to 10 days if urology office is not get you in within that timeframe.  Follow-up with [name] sooner if your symptoms worsen or change.  Any severe change in signs symptoms and recommend ED evaluation.

## 2018-11-30 ENCOUNTER — Telehealth: Payer: Self-pay | Admitting: Medical

## 2018-11-30 LAB — URINE CULTURE
MICRO NUMBER:: 299544
SPECIMEN QUALITY:: ADEQUATE

## 2018-11-30 MED ORDER — CIPROFLOXACIN HCL 500 MG PO TABS: 500.0000 mg | ORAL_TABLET | Freq: Two times a day (BID) | ORAL | 0 refills | Status: DC

## 2018-11-30 NOTE — Telephone Encounter (Signed)
Rx cipro sent to pt pharmacy. 

## 2018-12-11 ENCOUNTER — Telehealth: Payer: Self-pay

## 2018-12-11 NOTE — Telephone Encounter (Signed)
   Primary Cardiologist:  Jenkins Rouge, MD   Patient contacted.  History reviewed by Dr. [name] okay to schedule in 6 to 8 weeks.  No symptoms to suggest any unstable cardiac conditions.  Based on discussion, with current pandemic situation, we will be postponing this appointment for [facility].  If symptoms change, he has been instructed to contact our office. Patient has rescheduled appointment on 02/07/19  Michaelyn Barter, RN  12/11/2018 4:13 PM         .

## 2018-12-14 ENCOUNTER — Ambulatory Visit (INDEPENDENT_AMBULATORY_CARE_PROVIDER_SITE_OTHER): Payer: PPO | Admitting: Family Medicine

## 2018-12-14 ENCOUNTER — Other Ambulatory Visit: Payer: Self-pay

## 2018-12-14 DIAGNOSIS — N390 Urinary tract infection, site not specified: Secondary | ICD-10-CM

## 2018-12-14 DIAGNOSIS — R319 Hematuria, unspecified: Secondary | ICD-10-CM | POA: Diagnosis not present

## 2018-12-14 DIAGNOSIS — R739 Hyperglycemia, unspecified: Secondary | ICD-10-CM

## 2018-12-14 DIAGNOSIS — N289 Disorder of kidney and ureter, unspecified: Secondary | ICD-10-CM | POA: Diagnosis not present

## 2018-12-14 DIAGNOSIS — I10 Essential (primary) hypertension: Secondary | ICD-10-CM

## 2018-12-14 MED ORDER — CEFDINIR 300 MG PO CAPS: 300.0000 mg | ORAL_CAPSULE | Freq: Two times a day (BID) | ORAL | 1 refills | Status: AC

## 2018-12-14 NOTE — Assessment & Plan Note (Signed)
Well controlled, no changes to meds. Encouraged heart healthy diet such as the DASH diet and exercise as tolerated.  °

## 2018-12-14 NOTE — Assessment & Plan Note (Signed)
Hydrate and monitor 

## 2018-12-14 NOTE — Assessment & Plan Note (Signed)
hgba1c acceptable, minimize simple carbs. Increase exercise as tolerated.  

## 2018-12-16 DIAGNOSIS — N390 Urinary tract infection, site not specified: Secondary | ICD-10-CM | POA: Insufficient documentation

## 2018-12-16 NOTE — Progress Notes (Signed)
Virtual Visit via Telephone Note  I connected with [name] on 12/16/18 at  2:45 PM EDT by telephone and verified that I am speaking with the correct person using two identifiers.   I discussed the limitations, risks, security and privacy concerns of performing an evaluation and management service by telephone and the availability of in person appointments. I also discussed with the patient that there may be a patient responsible charge related to this service. The patient expressed understanding and agreed to proceed. [name], CMA tried to set people up on Webex but he was unable. He is spoken to by phone from his home   History of Present Illness: Patient is doing well. He was treated with Ciprofloxacin for his recent urinary tract infection and he feels better now. No hematuria, dysuria or urinary urgency. Does have some hesitancy and some frequency at times. Denies CP/palp/SOB/HA/congestion/fevers/GI or GU c/o. Taking meds as prescribed    Observations/Objective:   Assessment and Plan:   Follow Up Instructions:    I discussed the assessment and treatment plan with the patient. The patient was provided an opportunity to ask questions and all were answered. The patient agreed with the plan and demonstrated an understanding of the instructions.   The patient was advised to call back or seek an in-person evaluation if the symptoms worsen or if the condition fails to improve as anticipated.  I provided 15 minutes of non-face-to-face time during this encounter.   Penni Homans, MD

## 2018-12-16 NOTE — Assessment & Plan Note (Signed)
Encouraged increased hydration, report concerning symptoms. He is given a prescription for Cefdinir if symptoms return. He has already been referred to urology but awaits appt.

## 2018-12-18 ENCOUNTER — Ambulatory Visit: Payer: PPO | Admitting: Cardiovascular Disease

## 2018-12-18 ENCOUNTER — Other Ambulatory Visit (HOSPITAL_COMMUNITY): Payer: PPO

## 2018-12-21 ENCOUNTER — Other Ambulatory Visit: Payer: Self-pay | Admitting: Medical

## 2018-12-21 DIAGNOSIS — J449 Chronic obstructive pulmonary disease, unspecified: Secondary | ICD-10-CM | POA: Diagnosis not present

## 2018-12-21 DIAGNOSIS — R062 Wheezing: Secondary | ICD-10-CM | POA: Diagnosis not present

## 2018-12-21 DIAGNOSIS — J9622 Acute and chronic respiratory failure with hypercapnia: Secondary | ICD-10-CM | POA: Diagnosis not present

## 2018-12-21 DIAGNOSIS — G4733 Obstructive sleep apnea (adult) (pediatric): Secondary | ICD-10-CM | POA: Diagnosis not present

## 2018-12-22 ENCOUNTER — Ambulatory Visit: Payer: PPO | Admitting: *Deleted

## 2019-01-03 ENCOUNTER — Telehealth: Payer: Self-pay | Admitting: *Deleted

## 2019-01-03 NOTE — Telephone Encounter (Signed)
Received Home Health Certification and Plan of Care; forwarded to provider/SLS 04/15  

## 2019-01-16 DIAGNOSIS — R062 Wheezing: Secondary | ICD-10-CM | POA: Diagnosis not present

## 2019-01-16 DIAGNOSIS — G4733 Obstructive sleep apnea (adult) (pediatric): Secondary | ICD-10-CM | POA: Diagnosis not present

## 2019-01-20 DIAGNOSIS — G4733 Obstructive sleep apnea (adult) (pediatric): Secondary | ICD-10-CM | POA: Diagnosis not present

## 2019-01-20 DIAGNOSIS — J9622 Acute and chronic respiratory failure with hypercapnia: Secondary | ICD-10-CM | POA: Diagnosis not present

## 2019-01-20 DIAGNOSIS — J449 Chronic obstructive pulmonary disease, unspecified: Secondary | ICD-10-CM | POA: Diagnosis not present

## 2019-01-20 DIAGNOSIS — R062 Wheezing: Secondary | ICD-10-CM | POA: Diagnosis not present

## 2019-01-29 ENCOUNTER — Telehealth: Payer: Self-pay

## 2019-01-29 NOTE — Telephone Encounter (Signed)
Virtual Visit Pre-Appointment Phone Call  "(Name), I am calling you today to discuss your upcoming appointment. We are currently trying to limit exposure to the virus that causes COVID-19 by seeing patients at home rather than in the office."  1. "What is the BEST phone number to call the day of the visit?" - include this in appointment notes  2. "Do you have or have access to (through a family member/friend) a smartphone with video capability that we can use for your visit?" a. If yes - list this number in appt notes as "cell" (if different from BEST phone #) and list the appointment type as a VIDEO visit in appointment notes b. If no - list the appointment type as a PHONE visit in appointment notes  3. Confirm consent - "In the setting of the current Covid19 crisis, you are scheduled for a (phone or video) visit with your provider on (date) at (time).  Just as we do with many in-office visits, in order for you to participate in this visit, we must obtain consent.  If you'd like, I can send this to your mychart (if signed up) or email for you to review.  Otherwise, I can obtain your verbal consent now.  All virtual visits are billed to your insurance company just like a normal visit would be.  By agreeing to a virtual visit, we'd like you to understand that the technology does not allow for your provider to perform an examination, and thus may limit your provider's ability to fully assess your condition. If your provider identifies any concerns that need to be evaluated in person, we will make arrangements to do so.  Finally, though the technology is pretty good, we cannot assure that it will always work on either your or our end, and in the setting of a video visit, we may have to convert it to a phone-only visit.  In either situation, we cannot ensure that we have a secure connection.  Are you willing to proceed?YES  4. Advise patient to be prepared - "Two hours prior to your appointment, go  ahead and check your blood pressure, pulse, oxygen saturation, and your weight (if you have the equipment to check those) and write them all down. When your visit starts, your provider will ask you for this information. If you have an Apple Watch or Kardia device, please plan to have heart rate information ready on the day of your appointment. Please have a pen and paper handy nearby the day of the visit as well."  5. Give patient instructions for MyChart download to smartphone OR Doximity/[email or web address].me as below if video visit (depending on what platform provider is using)  6. Inform patient they will receive a phone call 15 minutes prior to their appointment time (may be from unknown caller ID) so they should be prepared to answer    [place] has been deemed a candidate for a follow-up tele-health visit to limit community exposure during the Covid-19 pandemic. I spoke with the patient via phone to ensure availability of phone/video source, confirm preferred email & phone number, and discuss instructions and expectations.  I reminded MASSAI HANKERSON to be prepared with any vital sign and/or heart rhythm information that could potentially be obtained via home monitoring, at the time of his visit. I reminded CASHUS HALTERMAN to expect a phone call prior to his visit.  Michaelyn Barter, RN 01/29/2019 5:05 PM  FULL LENGTH CONSENT FOR TELE-HEALTH VISIT   I hereby voluntarily request, consent and authorize [facility] and its employed or contracted physicians, physician assistants, nurse practitioners or other licensed health care professionals (the Practitioner), to provide me with telemedicine health care services (the "Services") as deemed necessary by the treating Practitioner. I acknowledge and consent to receive the Services by the Practitioner via telemedicine. I understand that the telemedicine visit will involve communicating with the Practitioner through live  audiovisual communication technology and the disclosure of certain medical information by electronic transmission. I acknowledge that I have been given the opportunity to request an in-person assessment or other available alternative prior to the telemedicine visit and am voluntarily participating in the telemedicine visit.  I understand that I have the right to withhold or withdraw my consent to the use of telemedicine in the course of my care at any time, without affecting my right to future care or treatment, and that the Practitioner or I may terminate the telemedicine visit at any time. I understand that I have the right to inspect all information obtained and/or recorded in the course of the telemedicine visit and may receive copies of available information for a reasonable fee.  I understand that some of the potential risks of receiving the Services via telemedicine include:  [name] Delay or interruption in medical evaluation due to technological equipment failure or disruption; . Information transmitted may not be sufficient (e.g. poor resolution of images) to allow for appropriate medical decision making by the Practitioner; and/or  . In rare instances, security protocols could fail, causing a breach of personal health information.  Furthermore, I acknowledge that it is my responsibility to provide information about my medical history, conditions and care that is complete and accurate to the best of my ability. I acknowledge that Practitioner's advice, recommendations, and/or decision may be based on factors not within their control, such as incomplete or inaccurate data provided by me or distortions of diagnostic images or specimens that may result from electronic transmissions. I understand that the practice of medicine is not an exact science and that Practitioner makes no warranties or guarantees regarding treatment outcomes. I acknowledge that I will receive a copy of this consent concurrently upon  execution via email to the email address I last provided but may also request a printed copy by calling the office of [facility].    I understand that my insurance will be billed for this visit.   I have read or had this consent read to me. . I understand the contents of this consent, which adequately explains the benefits and risks of the Services being provided via telemedicine.  . I have been provided ample opportunity to ask questions regarding this consent and the Services and have had my questions answered to my satisfaction. . I give my informed consent for the services to be provided through the use of telemedicine in my medical care  By participating in this telemedicine visit I agree to the above.

## 2019-01-30 NOTE — Progress Notes (Signed)
Virtual Visit via Telephone Note   This visit type was conducted due to national recommendations for restrictions regarding the COVID-19 Pandemic (e.g. social distancing) in an effort to limit this patient's exposure and mitigate transmission in our community.  Due to his co-morbid illnesses, this patient is at least at moderate risk for complications without adequate follow up.  This format is felt to be most appropriate for this patient at this time.  The patient did not have access to video technology/had technical difficulties with video requiring transitioning to audio format only (telephone).  All issues noted in this document were discussed and addressed.  No physical exam could be performed with this format.  Please refer to the patient's chart for his  consent to telehealth for [facility].   Date:  01/30/2019   ID:  [name], DOB [id number], MRN [id number]  Patient Location: Home Provider Location: Office  PCP:  [name], MD  Cardiologist:  Jenkins Rouge, MD   Electrophysiologist:  Constance Haw, MD   Evaluation Performed:  Follow-Up Visit  Chief Complaint:  TAVR/ PAF   History of Present Illness:    [name] is a 78 y.o. male with a h/o morbid obesity, severe aortic stenosis, s/p TAVR in 2016, PPM for AV block, 04/27/18. She was referred to the afib clinic after device clinic noted afib x 2 days. He has a CHA2DS2VASc score of at least 2 . He has been on asa and Plavix since TAVR and states that he bleeds easily. He denies any awareness of afib, no alcohol, some caffeine. Wears cpap.   F/u Afib clinic 11/09/18. Patient underwent DCCV on 10/27/18 and developed acute hypoxia, likely multifactorial in setting of COPD and fluid overload. He was admitted to the hospital for diuresis. He has not missed any doses of his Eliquis. He is in SR today.  Today, he denies symptoms of palpitations, chest pain, orthopnea, PND, dizziness, presyncope, syncope, or neurologic  sequela. The patient is tolerating medications without difficulties and is otherwise without complaint today.   Note he had no CAD at cath in 2016 prior to TAVR  Recent antibiotic Rx for UTI Dr Randel Pigg   Echo today showed stable TAVR valve with moderate MS stable gradients EF 65%  The patient does not have symptoms concerning for COVID-19 infection (fever, chills, cough, or new shortness of breath).    Past Medical History:  Diagnosis Date  . Anemia   . Aortic stenosis   . Aortic stenosis, severe    S/p Edwards Sapien 3 Transcatheter Heart Valve (size 26 mm, model # [id number], serial # [id number])  . Arthritis   . Back pain   . Bursitis   . Cataract    left immature  . Cellulitis 10/12/2015  . Complication of anesthesia    Halucinations  . Constipation    takes Miralax daily as well as Senokot daily  . DDD (degenerative disc disease)   . Gout    takes Allopurinol daily  . Heart valve disorder   . History of blood clots 1962   knee  . History of blood transfusion    no abnormal reaction noted  . History of shingles   . Hyperlipidemia    takes Atorvastatin daily  . Hypertension    takes Lisinopril daily  . Joint pain   . Low back pain 01/25/2017  . Neck pain on left side 01/25/2017  . OSA (obstructive sleep apnea)   . Osteoarthritis   .  Peripheral edema    takes Furosemide daily  . Peripheral neuropathy    takes Gabapentin daily  . Peroneal palsy    significant right foot drop  . Pneumonia 25+yrs ago   hx of  . Shortness of breath   . Swelling of extremity   . Urinary frequency   . Urinary urgency   . Valvular heart disease    Past Surgical History:  Procedure Laterality Date  . CARDIAC CATHETERIZATION N/A 05/02/2015   Procedure: Right/Left Heart Cath and Coronary Angiography;  Surgeon: Burnell Blanks, MD;  Location: [place] [facility];  Service: Cardiovascular;  Laterality: N/A;  . CARDIOVERSION N/A 10/27/2018   Procedure: CARDIOVERSION;  Surgeon: Fay Records, MD;  Location: [facility] ENDOSCOPY;  Service: Cardiovascular;  Laterality: N/A;  . COLONOSCOPY N/A 02/07/2013   Procedure: COLONOSCOPY;  Surgeon: Juanita Craver, MD;  Location: [facility] ENDOSCOPY;  Service: Endoscopy;  Laterality: N/A;  . COLONOSCOPY N/A 02/09/2013   Procedure: COLONOSCOPY;  Surgeon: Beryle Beams, MD;  Location: [place];  Service: Endoscopy;  Laterality: N/A;  . ESOPHAGOGASTRODUODENOSCOPY N/A 02/07/2013   Procedure: ESOPHAGOGASTRODUODENOSCOPY (EGD);  Surgeon: Juanita Craver, MD;  Location: [facility] ENDOSCOPY;  Service: Endoscopy;  Laterality: N/A;  . GIVENS CAPSULE STUDY N/A 02/09/2013   Procedure: GIVENS CAPSULE STUDY;  Surgeon: Beryle Beams, MD;  Location: [place];  Service: Endoscopy;  Laterality: N/A;  . HERNIA REPAIR     umbilical hernia  . KNEE SURGERY Right    multiple knee surgeries due to complication of R TKA  . LAMINOTOMY  1761   c6-t2  . PACEMAKER IMPLANT N/A 04/27/2018   Procedure: PACEMAKER IMPLANT;  Surgeon: Constance Haw, MD;  Location: [place] [facility];  Service: Cardiovascular;  Laterality: N/A;  . SPINE SURGERY    . TEE WITHOUT CARDIOVERSION N/A 03/07/2015   Procedure: TRANSESOPHAGEAL ECHOCARDIOGRAM (TEE);  Surgeon: Josue Hector, MD;  Location[facility] ENDOSCOPY;  Service: Cardiovascular;  Laterality: N/A;  ANES TO BRING PROPOFOL PER DOCTOR  . TEE WITHOUT CARDIOVERSION N/A 06/10/2015   Procedure: TRANSESOPHAGEAL ECHOCARDIOGRAM (TEE);  Surgeon: Burnell Blanks, MD;  Location: [place];  Service: Open Heart Surgery;  Laterality: N/A;  . TOTAL KNEE ARTHROPLASTY     right  . TRANSCATHETER AORTIC VALVE REPLACEMENT, TRANSFEMORAL Left 06/10/2015   Procedure: TRANSCATHETER AORTIC VALVE REPLACEMENT, TRANSFEMORAL;  Surgeon: Burnell Blanks, MD;  Location: [place];  Service: Open Heart Surgery;  Laterality: Left;     No outpatient medications have been marked as taking for the 02/07/19 encounter (Appointment) with Josue Hector, MD.     Allergies:    Coumadin [warfarin sodium]   Social History   Tobacco Use  . Smoking status: Former Smoker    Years: 30.00    Types: Pipe    Last attempt to quit: 09/20/2006    Years since quitting: 12.3  . Smokeless tobacco: Never Used  Substance Use Topics  . Alcohol use: No    Alcohol/week: 0.0 standard drinks  . Drug use: No     Family Hx: The patient's family history includes Arthritis in his sister and sister; Other in his father.  ROS:   Please see the history of present illness.     All other systems reviewed and are negative.   Prior CV studies:   The following studies were reviewed today:  Echo 04/27/18    Labs/Other Tests and Data Reviewed:    EKG:  AV pacing   Recent Labs: 10/27/2018: B Natriuretic Peptide  125.9 10/31/2018: Hemoglobin 11.4; Platelets 151 11/28/2018: ALT 28; BUN 32; Creatinine, Ser 1.09; Potassium 4.4; Sodium 139   Recent Lipid Panel Lab Results  Component Value Date/Time   CHOL 140 08/22/2018 09:51 AM   TRIG 126 08/22/2018 09:51 AM   HDL 30 (L) 08/22/2018 09:51 AM   CHOLHDL 4 11/24/2017 04:33 PM   LDLCALC 85 08/22/2018 09:51 AM   LDLDIRECT 85.0 07/28/2017 04:28 PM    Wt Readings from Last 3 Encounters:  12/14/18 (!) 154.2 kg  11/23/18 (!) 156.5 kg  11/15/18 (!) 154.2 kg     Objective:    Vital Signs:  There were no vitals taken for this visit.   No exam on telephone visit   ASSESSMENT & PLAN:    PAF:  Post [facility] 10/27/18 appears to be maintaining NSR continue eliquis CHADVASC 2  TAVR:  26 mm Sapien 3 2016 April 27 2018 echo with EF 60-65% mean gradient lower today 18 mmHg with no PVL noted 02/07/19  MVD:  Severe MAC with moderate functional MS mean gradient 13 mmHg MVA PT1/2 1.2 cm2 On lasix HR runs low with pacer back up consider adding beta blocker in future  HLD:  On statin labs with primary  PPM:  Post [employer] PPM dual chamber 04/27/18 normal function f/u [place]  HTN: Well controlled.  Continue current medications and low sodium Dash type  diet.  UTI:  Finished Cipro f/u Dr Randel Pigg   PSA elevated 11/28/18 needs to be repeated ? Prostatitis Rx Cipro    COVID-19 Education: The signs and symptoms of COVID-19 were discussed with the patient and how to seek care for testing (follow up with PCP or arrange E-visit).  The importance of social distancing was discussed today.  Time:   Today, I have spent 30 minutes with the patient with telehealth technology discussing the above problems.     Medication Adjustments/Labs and Tests Ordered: Current medicines are reviewed at length with the patient today.  Concerns regarding medicines are outlined above.   Tests Ordered: No orders of the defined types were placed in this encounter.   Medication Changes: No orders of the defined types were placed in this encounter.   Disposition:  Follow up in 6 months echo ordered   Signed, Jenkins Rouge, MD  01/30/2019 6:03 PM    [place]

## 2019-01-31 DIAGNOSIS — G4733 Obstructive sleep apnea (adult) (pediatric): Secondary | ICD-10-CM | POA: Diagnosis not present

## 2019-01-31 DIAGNOSIS — J449 Chronic obstructive pulmonary disease, unspecified: Secondary | ICD-10-CM | POA: Diagnosis not present

## 2019-01-31 NOTE — Progress Notes (Signed)
[facility] [address] [place], [place] [address] Phone: [phone] Subjective:   Fontaine No, am serving as a scribe for Dr. Hulan Saas.  I'm seeing this patient by the request  of:    CC: Bilateral shoulder  HUT:[id number]  [name] is a 78 y.o. male coming in with complaint of left shoulder pain. Last saw Dr. Paulla Fore on 11/23/2018. Patient given an injection. Patient states he has anterior shoulder pain that has increased today. Has to drive his vehicle with left hand. Has seen ortho and neuro prior to seeing Dr. Paulla Fore for injections. Is happy with injections to manage his pain.  Patient denies any new symptoms just worsening of previous symptoms.  X-rays have shown severe arthritic changes.  These were independently visualized by me   Past Medical History:  Diagnosis Date  . Anemia   . Aortic stenosis   . Aortic stenosis, severe    S/p Edwards Sapien 3 Transcatheter Heart Valve (size 26 mm, model # [id number], serial # [id number])  . Arthritis   . Back pain   . Bursitis   . Cataract    left immature  . Cellulitis 10/12/2015  . Complication of anesthesia    Halucinations  . Constipation    takes Miralax daily as well as Senokot daily  . DDD (degenerative disc disease)   . Gout    takes Allopurinol daily  . Heart valve disorder   . History of blood clots 1962   knee  . History of blood transfusion    no abnormal reaction noted  . History of shingles   . Hyperlipidemia    takes Atorvastatin daily  . Hypertension    takes Lisinopril daily  . Joint pain   . Low back pain 01/25/2017  . Neck pain on left side 01/25/2017  . OSA (obstructive sleep apnea)   . Osteoarthritis   . Peripheral edema    takes Furosemide daily  . Peripheral neuropathy    takes Gabapentin daily  . Peroneal palsy    significant right foot drop  . Pneumonia 25+yrs ago   hx of  . Shortness of breath   . Swelling of extremity   . Urinary frequency   . Urinary  urgency   . Valvular heart disease    Past Surgical History:  Procedure Laterality Date  . CARDIAC CATHETERIZATION N/A 05/02/2015   Procedure: Right/Left Heart Cath and Coronary Angiography;  Surgeon: Burnell Blanks, MD;  Location: [place] [facility];  Service: Cardiovascular;  Laterality: N/A;  . CARDIOVERSION N/A 10/27/2018   Procedure: CARDIOVERSION;  Surgeon: Fay Records, MD;  Location: [facility] ENDOSCOPY;  Service: Cardiovascular;  Laterality: N/A;  . COLONOSCOPY N/A 02/07/2013   Procedure: COLONOSCOPY;  Surgeon: Juanita Craver, MD;  Location: [facility] ENDOSCOPY;  Service: Endoscopy;  Laterality: N/A;  . COLONOSCOPY N/A 02/09/2013   Procedure: COLONOSCOPY;  Surgeon: Beryle Beams, MD;  Location: [place];  Service: Endoscopy;  Laterality: N/A;  . ESOPHAGOGASTRODUODENOSCOPY N/A 02/07/2013   Procedure: ESOPHAGOGASTRODUODENOSCOPY (EGD);  Surgeon: Juanita Craver, MD;  [facility] ENDOSCOPY;  Service: Endoscopy;  Laterality: N/A;  . GIVENS CAPSULE STUDY N/A 02/09/2013   Procedure: GIVENS CAPSULE STUDY;  Surgeon: Beryle Beams, MD;  Location: [place];  Service: Endoscopy;  Laterality: N/A;  . HERNIA REPAIR     umbilical hernia  . KNEE SURGERY Right    multiple knee surgeries due to complication of R TKA  . LAMINOTOMY  1193  c6-t2  . PACEMAKER IMPLANT N/A 04/27/2018   Procedure: PACEMAKER IMPLANT;  Surgeon: Constance Haw, MD;  Location: [place] [facility];  Service: Cardiovascular;  Laterality: N/A;  . SPINE SURGERY    . TEE WITHOUT CARDIOVERSION N/A 03/07/2015   Procedure: TRANSESOPHAGEAL ECHOCARDIOGRAM (TEE);  Surgeon: Josue Hector, MD;  Location: [facility] ENDOSCOPY;  Service: Cardiovascular;  Laterality: N/A;  ANES TO BRING PROPOFOL PER DOCTOR  . TEE WITHOUT CARDIOVERSION N/A 06/10/2015   Procedure: TRANSESOPHAGEAL ECHOCARDIOGRAM (TEE);  Surgeon: Burnell Blanks, MD;  Location: [place];  Service: Open Heart Surgery;  Laterality: N/A;  . TOTAL KNEE ARTHROPLASTY     right  .  TRANSCATHETER AORTIC VALVE REPLACEMENT, TRANSFEMORAL Left 06/10/2015   Procedure: TRANSCATHETER AORTIC VALVE REPLACEMENT, TRANSFEMORAL;  Surgeon: Burnell Blanks, MD;  Location: [place];  Service: Open Heart Surgery;  Laterality: Left;   Social History   Socioeconomic History  . Marital status: Married    Spouse name: [name]  . Number of children: 3  . Years of education: Not on file  . Highest education level: Not on file  Occupational History  . Occupation: retired Investment banker, operational  . Financial resource strain: Not on file  . Food insecurity:    Worry: Not on file    Inability: Not on file  . Transportation needs:    Medical: Not on file    Non-medical: Not on file  Tobacco Use  . Smoking status: Former Smoker    Years: 30.00    Types: Pipe    Last attempt to quit: 09/20/2006    Years since quitting: 12.3  . Smokeless tobacco: Never Used  Substance and Sexual Activity  . Alcohol use: No    Alcohol/week: 0.0 standard drinks  . Drug use: No  . Sexual activity: Never    Comment: lives with wife, retired, no dietary restrictions  Lifestyle  . Physical activity:    Days per week: Not on file    Minutes per session: Not on file  . Stress: Not on file  Relationships  . Social connections:    Talks on phone: Not on file    Gets together: Not on file    Attends religious service: Not on file    Active member of club or organization: Not on file    Attends meetings of clubs or organizations: Not on file    Relationship status: Not on file  Other Topics Concern  . Not on file  Social History Narrative  . Not on file   Allergies  Allergen Reactions  . Coumadin [Warfarin Sodium] Other (See Comments)    States he can't be on this-bleeds out   Family History  Problem Relation Age of Onset  . Other Father        POSSIBLE HEART ATTACK  . Arthritis Sister        "crippling"  . Arthritis Sister      Current Outpatient Medications (Cardiovascular):  .   atorvastatin (LIPITOR) 40 MG tablet, Take 1 and a half tablet daily by mouth  (60mg ) (Patient taking differently: Take 60 mg by mouth daily. Take 1 and a half tablet daily by mouth  (60mg )) .  furosemide (LASIX) 40 MG tablet, TAKE 1 TABLET TWICE A DAY TILL 11/09/2018, AFTER THAT TAKE 1 TABLET DAILY. .  furosemide (LASIX) 40 MG tablet, TAKE 1 TABLET BY MOUTH EVERY DAY   Current Outpatient Medications (Analgesics):  .  acetaminophen (TYLENOL) 500 MG tablet, Take 1,000  mg by mouth 2 (two) times daily.  [name]  aspirin 81 MG EC tablet, TAKE 1 TABLET (81 MG TOTAL) BY MOUTH DAILY.  Current Outpatient Medications (Hematological):  [name]  ELIQUIS 5 MG TABS tablet, TAKE 1 TABLET BY MOUTH TWICE A DAY (Patient taking differently: Take 5 mg by mouth 2 (two) times daily. ) .  Ferrous Fumarate-Folic Acid (HEMATINIC/FOLIC ACID) 235-3 MG TABS, Take 1 tablet by mouth 2 (two) times a week.  Current Outpatient Medications (Other):  .  ammonium lactate (LAC-HYDRIN) 12 % lotion, Apply topically 3 (three) times daily. .  ciprofloxacin (CIPRO) 500 MG tablet, Take 1 tablet (500 mg total) by mouth 2 (two) times daily. Stop bactrim .  Coenzyme Q10 (COQ10 PO), Take 300 mg by mouth daily.  [name]  ELDERBERRY PO, Take 2 tablets by mouth daily.  [name]  gabapentin (NEURONTIN) 300 MG capsule, Take 1 capsule (300 mg total) by mouth 3 (three) times daily. .  hydrocerin (EUCERIN) CREA, Apply 1 application topically 2 (two) times daily as needed (dry skin). .  hydrocortisone (ANUSOL-HC) 2.5 % rectal cream, Place rectally 2 (two) times daily. .  Menthol, Topical Analgesic, (BENGAY EX), Apply 1 application topically at bedtime. .  Misc Natural Products (TART CHERRY ADVANCED PO), Take 1,200 mg by mouth daily.  .  Multiple Vitamin (MULTIVITAMIN WITH MINERALS) TABS, Take 1 tablet by mouth daily. .  polyethylene glycol (MIRALAX / GLYCOLAX) packet, Take 17 g by mouth daily. .  sennosides-docusate sodium (SENOKOT-S) 8.6-50 MG tablet, Take 1 tablet by  mouth 2 (two) times daily. .  vitamin C (ASCORBIC ACID) 500 MG tablet, Take 500 mg by mouth daily. [name]  VITAMIN D PO, Take 5,000 Units by mouth daily.     Past medical history, social, surgical and family history all reviewed in electronic medical record.  No pertanent information unless stated regarding to the chief complaint.   Review of Systems:  No headache, visual changes, nausea, vomiting, diarrhea, constipation, dizziness, abdominal pain, skin rash, fevers, chills, night sweats, weight loss, swollen lymph nodes, body aches, joint swelling, chest pain, shortness of breath, mood changes.  Positive muscle aches  Objective  Blood pressure 126/82, pulse 63, height 6\' 2"  (1.88 m), weight (!) 340 lb (154.2 kg), SpO2 98 %.    General: No apparent distress alert and oriented x3 mood and affect normal, dressed appropriately.  Overweight HEENT: Pupils equal, extraocular movements intact  Respiratory: Patient's speak in full sentences and does not appear short of breath  Cardiovascular: 3+ lower extremity edema, minorly tender, no erythema hemosiderin deposits noted Skin: Warm dry intact with no signs of infection or rash on extremities or on axial skeleton.  Abdomen: Soft nontender  Neuro: Cranial nerves II through XII are intact, neurovascularly intact in all extremities with 2+ DTRs and 2+ pulses.  Lymph: No lymphadenopathy of posterior or anterior cervical chain or axillae bilaterally.  Gait patient is in a wheelchair MSK:  tender arthritic changes of multiple joints  Left shoulder so severe atrophy of the musculature.  Patient has crepitus with range of motion and has decreased range of motion in all planes.  Rotator cuff strength 2+ out of 5.     Procedure: Real-time Ultrasound Guided Injection of left glenohumeral joint Device: GE Logiq E  Ultrasound guided injection is preferred based studies that show increased duration, increased effect, greater accuracy, decreased procedural  pain, increased response rate with ultrasound guided versus blind injection.  Verbal informed consent obtained.  Time-out conducted.  Noted no  overlying erythema, induration, or other signs of local infection.  Skin prepped in a sterile fashion.  Local anesthesia: Topical Ethyl chloride.  With sterile technique and under real time ultrasound guidance:  Joint visualized.  21g 2 inch needle inserted posterior approach. Pictures taken for needle placement. Patient did have injection of 2 cc of 0.5% Marcaine, and 1cc of Kenalog 40 mg/dL. Completed without difficulty  Pain immediately resolved suggesting accurate placement of the medication.  Advised to call if fevers/chills, erythema, induration, drainage, or persistent bleeding.  Images permanently stored and available for review in the ultrasound unit.  Impression: Technically successful ultrasound guided injection.    Impression and Recommendations:     This case required medical decision making of moderate complexity. The above documentation has been reviewed and is accurate and complete Lyndal Pulley, DO       Note: This dictation was prepared with Dragon dictation along with smaller phrase technology. Any transcriptional errors that result from this process are unintentional.

## 2019-02-01 ENCOUNTER — Ambulatory Visit: Payer: Self-pay

## 2019-02-01 ENCOUNTER — Ambulatory Visit: Payer: PPO | Admitting: Sports Medicine

## 2019-02-01 ENCOUNTER — Ambulatory Visit: Payer: PPO | Admitting: Family Medicine

## 2019-02-01 ENCOUNTER — Other Ambulatory Visit: Payer: Self-pay

## 2019-02-01 ENCOUNTER — Encounter: Payer: Self-pay | Admitting: Family Medicine

## 2019-02-01 VITALS — BP 126/82 | HR 63 | Ht 74.0 in | Wt 340.0 lb

## 2019-02-01 DIAGNOSIS — G8929 Other chronic pain: Secondary | ICD-10-CM

## 2019-02-01 DIAGNOSIS — M25512 Pain in left shoulder: Secondary | ICD-10-CM | POA: Diagnosis not present

## 2019-02-01 DIAGNOSIS — M19012 Primary osteoarthritis, left shoulder: Secondary | ICD-10-CM

## 2019-02-01 NOTE — Patient Instructions (Signed)
Good to see you  You should do well  Ice 20 minutes 2 times daily. Usually after activity and before bed. pennsaid pinkie amount topically 2 times daily as needed.  See me again in 3 months

## 2019-02-01 NOTE — Assessment & Plan Note (Signed)
Injection given today.  Patient does have degenerative joint disease.  Severe overall.  Only other option would be replacement with patient's other comorbidities this is not a possibility.  Patient will follow-up with me again in 3 months and we will continue the injections.

## 2019-02-05 ENCOUNTER — Other Ambulatory Visit: Payer: Self-pay

## 2019-02-05 ENCOUNTER — Telehealth (HOSPITAL_COMMUNITY): Payer: Self-pay

## 2019-02-05 ENCOUNTER — Ambulatory Visit (INDEPENDENT_AMBULATORY_CARE_PROVIDER_SITE_OTHER): Payer: PPO | Admitting: *Deleted

## 2019-02-05 DIAGNOSIS — I441 Atrioventricular block, second degree: Secondary | ICD-10-CM

## 2019-02-05 NOTE — Telephone Encounter (Signed)

## 2019-02-06 LAB — CUP PACEART REMOTE DEVICE CHECK
Date Time Interrogation Session: 20200519070159
Implantable Lead Implant Date: 20190808
Implantable Lead Implant Date: 20190808
Implantable Lead Location: 753859
Implantable Lead Location: 753860
Implantable Pulse Generator Implant Date: 20190808
Pulse Gen Model: 2272
Pulse Gen Serial Number: 9052893

## 2019-02-07 ENCOUNTER — Telehealth: Payer: Self-pay

## 2019-02-07 ENCOUNTER — Other Ambulatory Visit: Payer: Self-pay

## 2019-02-07 ENCOUNTER — Ambulatory Visit (HOSPITAL_COMMUNITY): Payer: PPO | Attending: Cardiovascular Disease

## 2019-02-07 ENCOUNTER — Telehealth (INDEPENDENT_AMBULATORY_CARE_PROVIDER_SITE_OTHER): Payer: PPO | Admitting: Cardiovascular Disease

## 2019-02-07 DIAGNOSIS — I359 Nonrheumatic aortic valve disorder, unspecified: Secondary | ICD-10-CM | POA: Diagnosis not present

## 2019-02-07 DIAGNOSIS — Z952 Presence of prosthetic heart valve: Secondary | ICD-10-CM | POA: Diagnosis not present

## 2019-02-07 DIAGNOSIS — I050 Rheumatic mitral stenosis: Secondary | ICD-10-CM

## 2019-02-07 NOTE — Telephone Encounter (Signed)
-----   Message from Josue Hector, MD sent at 02/07/2019 11:30 AM EDT ----- TAVR valve good moderate MS stable f/u echo in a year ----- Message ----- From: Interface, [employer] Sent: 02/07/2019  11:09 AM EDT To: Josue Hector, MD

## 2019-02-07 NOTE — Telephone Encounter (Signed)
Patient aware of results. Per Dr. [name], TAVR valve good moderate MS stable f/u echo in a year. Patient verbalized understanding.

## 2019-02-07 NOTE — Patient Instructions (Signed)

## 2019-02-13 ENCOUNTER — Encounter: Payer: Self-pay | Admitting: Cardiology

## 2019-02-13 ENCOUNTER — Ambulatory Visit (INDEPENDENT_AMBULATORY_CARE_PROVIDER_SITE_OTHER): Payer: PPO | Admitting: Family Medicine

## 2019-02-13 ENCOUNTER — Other Ambulatory Visit: Payer: Self-pay

## 2019-02-13 DIAGNOSIS — N390 Urinary tract infection, site not specified: Secondary | ICD-10-CM

## 2019-02-13 DIAGNOSIS — K5900 Constipation, unspecified: Secondary | ICD-10-CM | POA: Diagnosis not present

## 2019-02-13 DIAGNOSIS — I10 Essential (primary) hypertension: Secondary | ICD-10-CM | POA: Diagnosis not present

## 2019-02-13 DIAGNOSIS — I4819 Other persistent atrial fibrillation: Secondary | ICD-10-CM

## 2019-02-13 DIAGNOSIS — R319 Hematuria, unspecified: Secondary | ICD-10-CM

## 2019-02-13 DIAGNOSIS — I350 Nonrheumatic aortic (valve) stenosis: Secondary | ICD-10-CM

## 2019-02-13 DIAGNOSIS — R972 Elevated prostate specific antigen [PSA]: Secondary | ICD-10-CM | POA: Diagnosis not present

## 2019-02-13 DIAGNOSIS — D696 Thrombocytopenia, unspecified: Secondary | ICD-10-CM | POA: Diagnosis not present

## 2019-02-13 DIAGNOSIS — R739 Hyperglycemia, unspecified: Secondary | ICD-10-CM

## 2019-02-13 DIAGNOSIS — C61 Malignant neoplasm of prostate: Secondary | ICD-10-CM | POA: Insufficient documentation

## 2019-02-13 DIAGNOSIS — E7800 Pure hypercholesterolemia, unspecified: Secondary | ICD-10-CM

## 2019-02-13 HISTORY — DX: Malignant neoplasm of prostate: C61

## 2019-02-13 NOTE — Assessment & Plan Note (Signed)
hgba1c acceptable, minimize simple carbs. Increase exercise as tolerated.  

## 2019-02-13 NOTE — Assessment & Plan Note (Signed)
Encouraged heart healthy diet, increase exercise, avoid trans fats, consider a krill oil cap daily, tolerating Atorvastatin 

## 2019-02-13 NOTE — Assessment & Plan Note (Signed)
Urinary symptoms have resolved with treatment and daily Cranberry/Azo tabs. Recheck UA and culture with next visit.

## 2019-02-13 NOTE — Assessment & Plan Note (Signed)
Likely prostatitis and symptoms resolved with treatment. Will repeat PSA with blood draw next month

## 2019-02-13 NOTE — Assessment & Plan Note (Signed)
Asymptomatic. 

## 2019-02-13 NOTE — Assessment & Plan Note (Signed)
Had recent repeat echo and conversation with cardiology and everyone is happy with his response to his surgery. He feels well

## 2019-02-13 NOTE — Assessment & Plan Note (Signed)
Improved some with Miralax and increased fluid intake but still struggles at times so he is encouraged to add Benefiber to his daily dose.

## 2019-02-13 NOTE — Assessment & Plan Note (Signed)
Check CBC with next blood draw

## 2019-02-13 NOTE — Progress Notes (Signed)
Virtual Visit via Telephone Note  I connected with [name] on 02/13/19 at  3:40 PM EDT by a telephone enabled telemedicine application and verified that I am speaking with the correct person using two identifiers. [name], CMA attempted to get patient set up on video visit but was unable to complete so visit was completed via telephone  Location: Patient: home Provider: home   I discussed the limitations of evaluation and management by telemedicine and the availability of in person appointments. The patient expressed understanding and agreed to proceed.    Subjective:    Patient ID: [name], male    DOB: 1940/12/11, 79 y.o.   MRN: [id number]  No chief complaint on file.   HPI Patient is in today for follow up on chronic medical concerns including hypertension, hyperlipidemia, atrial fibrillation, and hyperglycemia. He feels well today. He has been seen with urinary symptoms and was treated for prostatitis with an elevated PSA. Since treatment he feels much better and has no urinary complaints. No recent febrile illness or hospitalizations since his last visit. Denies CP/palp/SOB/HA/congestion/fevers/GI or GU c/o. Taking meds as prescribed  Past Medical History:  Diagnosis Date  . Anemia   . Aortic stenosis   . Aortic stenosis, severe    S/p Edwards Sapien 3 Transcatheter Heart Valve (size 26 mm, model # [id number], serial # [id number])  . Arthritis   . Back pain   . Bursitis   . Cataract    left immature  . Cellulitis 10/12/2015  . Complication of anesthesia    Halucinations  . Constipation    takes Miralax daily as well as Senokot daily  . DDD (degenerative disc disease)   . Gout    takes Allopurinol daily  . Heart valve disorder   . History of blood clots 1962   knee  . History of blood transfusion    no abnormal reaction noted  . History of shingles   . Hyperlipidemia    takes Atorvastatin daily  . Hypertension    takes Lisinopril daily  . Joint  pain   . Low back pain 01/25/2017  . Neck pain on left side 01/25/2017  . OSA (obstructive sleep apnea)   . Osteoarthritis   . Peripheral edema    takes Furosemide daily  . Peripheral neuropathy    takes Gabapentin daily  . Peroneal palsy    significant right foot drop  . Pneumonia 25+yrs ago   hx of  . Shortness of breath   . Swelling of extremity   . Urinary frequency   . Urinary urgency   . Valvular heart disease     Past Surgical History:  Procedure Laterality Date  . CARDIAC CATHETERIZATION N/A 05/02/2015   Procedure: Right/Left Heart Cath and Coronary Angiography;  Surgeon: Burnell Blanks, MD;  Location: [place] [facility];  Service: Cardiovascular;  Laterality: N/A;  . CARDIOVERSION N/A 10/27/2018   Procedure: CARDIOVERSION;  Surgeon: Fay Records, MD;  Location: [facility] ENDOSCOPY;  Service: Cardiovascular;  Laterality: N/A;  . COLONOSCOPY N/A 02/07/2013   Procedure: COLONOSCOPY;  Surgeon: Juanita Craver, MD;  Location: [facility] ENDOSCOPY;  Service: Endoscopy;  Laterality: N/A;  . COLONOSCOPY N/A 02/09/2013   Procedure: COLONOSCOPY;  Surgeon: Beryle Beams, MD;  Location: [place];  Service: Endoscopy;  Laterality: N/A;  . ESOPHAGOGASTRODUODENOSCOPY N/A 02/07/2013   Procedure: ESOPHAGOGASTRODUODENOSCOPY (EGD);  Surgeon: Juanita Craver, MD;  Location: [facility] ENDOSCOPY;  Service: Endoscopy;  Laterality: N/A;  . GIVENS CAPSULE STUDY N/A 02/09/2013  Procedure: GIVENS CAPSULE STUDY;  Surgeon: Beryle Beams, MD;  Location: [facility] ENDOSCOPY;  Service: Endoscopy;  Laterality: N/A;  . HERNIA REPAIR     umbilical hernia  . KNEE SURGERY Right    multiple knee surgeries due to complication of R TKA  . LAMINOTOMY  6789   c6-t2  . PACEMAKER IMPLANT N/A 04/27/2018   Procedure: PACEMAKER IMPLANT;  Surgeon: Constance Haw, MD;  Location: [place] [facility];  Service: Cardiovascular;  Laterality: N/A;  . SPINE SURGERY    . TEE WITHOUT CARDIOVERSION N/A 03/07/2015   Procedure: TRANSESOPHAGEAL  ECHOCARDIOGRAM (TEE);  Surgeon: Josue Hector, MD;  Location: [facility] END[facility]COPY;  Service: Cardiovascular;  Laterality: N/A;  ANES TO BRING PROPOFOL PER DOCTOR  . TEE WITHOUT CARDIOVERSION N/A 06/10/2015   Procedure: TRANSESOPHAGEAL ECHOCARDIOGRAM (TEE);  Surgeon: Burnell Blanks, MD;  Location: [place];  Service: Open Heart Surgery;  Laterality: N/A;  . TOTAL KNEE ARTHROPLASTY     right  . TRANSCATHETER AORTIC VALVE REPLACEMENT, TRANSFEMORAL Left 06/10/2015   Procedure: TRANSCATHETER AORTIC VALVE REPLACEMENT, TRANSFEMORAL;  Surgeon: Burnell Blanks, MD;  Location: [place];  Service: Open Heart Surgery;  Laterality: Left;    Family History  Problem Relation Age of Onset  . Other Father        POSSIBLE HEART ATTACK  . Arthritis Sister        "crippling"  . Arthritis Sister     Social History   Socioeconomic History  . Marital status: Married    Spouse name: [name] Mood  . Number of children: 3  . Years of education: Not on file  . Highest education level: Not on file  Occupational History  . Occupation: retired Investment banker, operational  . Financial resource strain: Not on file  . Food insecurity:    Worry: Not on file    Inability: Not on file  . Transportation needs:    Medical: Not on file    Non-medical: Not on file  Tobacco Use  . Smoking status: Former Smoker    Years: 30.00    Types: Pipe    Last attempt to quit: 09/20/2006    Years since quitting: 12.4  . Smokeless tobacco: Never Used  Substance and Sexual Activity  . Alcohol use: No    Alcohol/week: 0.0 standard drinks  . Drug use: No  . Sexual activity: Never    Comment: lives with wife, retired, no dietary restrictions  Lifestyle  . Physical activity:    Days per week: Not on file    Minutes per session: Not on file  . Stress: Not on file  Relationships  . Social connections:    Talks on phone: Not on file    Gets together: Not on file    Attends religious service: Not on file    Active member of  club or organization: Not on file    Attends meetings of clubs or organizations: Not on file    Relationship status: Not on file  . Intimate partner violence:    Fear of current or ex partner: Not on file    Emotionally abused: Not on file    Physically abused: Not on file    Forced sexual activity: Not on file  Other Topics Concern  . Not on file  Social History Narrative  . Not on file    Outpatient Medications Prior to Visit  Medication Sig Dispense Refill  . acetaminophen (TYLENOL) 500 MG tablet Take 1,000 mg by  mouth 2 (two) times daily.     [name] ammonium lactate (LAC-HYDRIN) 12 % lotion Apply topically 3 (three) times daily. 400 g 0  . aspirin 81 MG EC tablet TAKE 1 TABLET (81 MG TOTAL) BY MOUTH DAILY. 30 tablet 11  . atorvastatin (LIPITOR) 40 MG tablet Take 1 and a half tablet daily by mouth  (60mg ) (Patient taking differently: Take 60 mg by mouth daily. Take 1 and a half tablet daily by mouth  (60mg )) 135 tablet 5  . ciprofloxacin (CIPRO) 500 MG tablet Take 1 tablet (500 mg total) by mouth 2 (two) times daily. Stop bactrim 20 tablet 0  . Coenzyme Q10 (COQ10 PO) Take 300 mg by mouth daily.     [name] ELDERBERRY PO Take 2 tablets by mouth daily.     [name] ELIQUIS 5 MG TABS tablet TAKE 1 TABLET BY MOUTH TWICE A DAY (Patient taking differently: Take 5 mg by mouth 2 (two) times daily. ) 60 tablet 6  . Ferrous Fumarate-Folic Acid (HEMATINIC/FOLIC ACID) 850-2 MG TABS Take 1 tablet by mouth 2 (two) times a week. 90 each 1  . furosemide (LASIX) 40 MG tablet TAKE 1 TABLET TWICE A DAY TILL 11/09/2018, AFTER THAT TAKE 1 TABLET DAILY. 30 tablet 0  . gabapentin (NEURONTIN) 300 MG capsule Take 1 capsule (300 mg total) by mouth 3 (three) times daily. 270 capsule 2  . hydrocerin (EUCERIN) CREA Apply 1 application topically 2 (two) times daily as needed (dry skin). 113 g 0  . hydrocortisone (ANUSOL-HC) 2.5 % rectal cream Place rectally 2 (two) times daily. 30 g 0  . Menthol, Topical Analgesic, (BENGAY EX)  Apply 1 application topically at bedtime.    . Misc Natural Products (TART CHERRY ADVANCED PO) Take 1,200 mg by mouth daily.     . Multiple Vitamin (MULTIVITAMIN WITH MINERALS) TABS Take 1 tablet by mouth daily.    . polyethylene glycol (MIRALAX / GLYCOLAX) packet Take 17 g by mouth daily.    . sennosides-docusate sodium (SENOKOT-S) 8.6-50 MG tablet Take 1 tablet by mouth 2 (two) times daily.    . vitamin C (ASCORBIC ACID) 500 MG tablet Take 500 mg by mouth daily.    [name] VITAMIN D PO Take 5,000 Units by mouth daily.      No facility-administered medications prior to visit.     Allergies  Allergen Reactions  . Coumadin [Warfarin Sodium] Other (See Comments)    States he can't be on this-bleeds out    Review of Systems  Constitutional: Negative for fever and malaise/fatigue.  HENT: Negative for congestion.   Eyes: Negative for blurred vision.  Respiratory: Negative for shortness of breath.   Cardiovascular: Negative for chest pain, palpitations and leg swelling.  Gastrointestinal: Positive for constipation. Negative for abdominal pain, blood in stool and nausea.  Genitourinary: Negative for dysuria and frequency.  Musculoskeletal: Negative for falls.  Skin: Negative for rash.  Neurological: Negative for dizziness, loss of consciousness and headaches.  Endo/Heme/Allergies: Negative for environmental allergies.  Psychiatric/Behavioral: Negative for depression. The patient is not nervous/anxious.        Objective:    Physical Exam unable to obtain due to telephone visit.   BP 130/60 (BP Location: Left Arm, Patient Position: Sitting, Cuff Size: Normal)   Pulse 63   Wt (!) 340 lb (154.2 kg)   BMI 43.65 kg/m  Wt Readings from Last 3 Encounters:  02/13/19 (!) 340 lb (154.2 kg)  02/07/19 (!) 340 lb (154.2 kg)  02/01/19 (!) 340  lb (154.2 kg)    Diabetic Foot Exam - Simple   No data filed     Lab Results  Component Value Date   WBC 7.7 10/31/2018   HGB 11.4 (L) 10/31/2018    HCT 38.3 (L) 10/31/2018   PLT 151 10/31/2018   GLUCOSE 89 11/28/2018   CHOL 140 08/22/2018   TRIG 126 08/22/2018   HDL 30 (L) 08/22/2018   LDLDIRECT 85.0 07/28/2017   LDLCALC 85 08/22/2018   ALT 28 11/28/2018   AST 23 11/28/2018   NA 139 11/28/2018   K 4.4 11/28/2018   CL 100 11/28/2018   CREATININE 1.09 11/28/2018   BUN 32 (H) 11/28/2018   CO2 32 11/28/2018   TSH 1.71 11/24/2017   PSA 22.91 (H) 11/28/2018   INR 1.32 06/10/2015   HGBA1C 5.6 08/22/2018    Lab Results  Component Value Date   TSH 1.71 11/24/2017   Lab Results  Component Value Date   WBC 7.7 10/31/2018   HGB 11.4 (L) 10/31/2018   HCT 38.3 (L) 10/31/2018   MCV 99.2 10/31/2018   PLT 151 10/31/2018   Lab Results  Component Value Date   NA 139 11/28/2018   K 4.4 11/28/2018   CO2 32 11/28/2018   GLUCOSE 89 11/28/2018   BUN 32 (H) 11/28/2018   CREATININE 1.09 11/28/2018   BILITOT 0.4 11/28/2018   ALKPHOS 74 11/28/2018   AST 23 11/28/2018   ALT 28 11/28/2018   PROT 6.0 11/28/2018   ALBUMIN 3.7 11/28/2018   CALCIUM 9.1 11/28/2018   ANIONGAP 10 11/09/2018   GFR 65.72 11/28/2018   Lab Results  Component Value Date   CHOL 140 08/22/2018   Lab Results  Component Value Date   HDL 30 (L) 08/22/2018   Lab Results  Component Value Date   LDLCALC 85 08/22/2018   Lab Results  Component Value Date   TRIG 126 08/22/2018   Lab Results  Component Value Date   CHOLHDL 4 11/24/2017   Lab Results  Component Value Date   HGBA1C 5.6 08/22/2018       Assessment & Plan:   Problem List Items Addressed This Visit    HYPERCHOLESTEROLEMIA    Encouraged heart healthy diet, increase exercise, avoid trans fats, consider a krill oil cap daily, tolerating Atorvastatin      Thrombocytopenia, unspecified (HCC)    Check CBC with next blood draw      Severe aortic valve stenosis    Had recent repeat echo and conversation with cardiology and everyone is happy with his response to his surgery. He feels well       Constipation    Improved some with Miralax and increased fluid intake but still struggles at times so he is encouraged to add Benefiber to his daily dose.      Hyperglycemia    hgba1c acceptable, minimize simple carbs. Increase exercise as tolerated.       Essential hypertension    Encouraged to use his BP cuff at home and take a set of vitals weekly notify us with any concerns.      Persistent atrial fibrillation    Asymptomatic      Urinary tract infection    Urinary symptoms have resolved with treatment and daily Cranberry/Azo tabs. Recheck UA and culture with next visit.       Elevated PSA    Likely prostatitis and symptoms resolved with treatment. Will repeat PSA with blood draw next month  I am having Pierce D. Pittmon maintain his VITAMIN D PO, sennosides-docusate sodium, multivitamin with minerals, acetaminophen, Coenzyme Q10 (COQ10 PO), polyethylene glycol, aspirin, Misc Natural Products (TART CHERRY ADVANCED PO), atorvastatin, gabapentin, Ferrous Fumarate-Folic Acid, ELDERBERRY PO, vitamin C, Eliquis, (Menthol, Topical Analgesic, (BENGAY EX)), ammonium lactate, hydrocerin, hydrocortisone, furosemide, and ciprofloxacin.  No orders of the defined types were placed in this encounter.   I discussed the assessment and treatment plan with the patient. The patient was provided an opportunity to ask questions and all were answered. The patient agreed with the plan and demonstrated an understanding of the instructions.   The patient was advised to call back or seek an in-person evaluation if the symptoms worsen or if the condition fails to improve as anticipated.  I provided 25 minutes of non-face-to-face time during this encounter.   Penni Homans, MD

## 2019-02-13 NOTE — Progress Notes (Signed)
Remote pacemaker transmission.   

## 2019-02-13 NOTE — Assessment & Plan Note (Signed)
Encouraged to use his BP cuff at home and take a set of vitals weekly notify us with any concerns.

## 2019-02-14 NOTE — Addendum Note (Signed)
Addended by: [name] A on: 02/14/2019 08:02 AM   Modules accepted: Orders

## 2019-02-15 DIAGNOSIS — G4733 Obstructive sleep apnea (adult) (pediatric): Secondary | ICD-10-CM | POA: Diagnosis not present

## 2019-02-15 DIAGNOSIS — R062 Wheezing: Secondary | ICD-10-CM | POA: Diagnosis not present

## 2019-02-20 DIAGNOSIS — G4733 Obstructive sleep apnea (adult) (pediatric): Secondary | ICD-10-CM | POA: Diagnosis not present

## 2019-02-20 DIAGNOSIS — J9622 Acute and chronic respiratory failure with hypercapnia: Secondary | ICD-10-CM | POA: Diagnosis not present

## 2019-02-20 DIAGNOSIS — R062 Wheezing: Secondary | ICD-10-CM | POA: Diagnosis not present

## 2019-02-20 DIAGNOSIS — J449 Chronic obstructive pulmonary disease, unspecified: Secondary | ICD-10-CM | POA: Diagnosis not present

## 2019-03-01 ENCOUNTER — Telehealth: Payer: Self-pay

## 2019-03-01 ENCOUNTER — Other Ambulatory Visit: Payer: Self-pay

## 2019-03-01 ENCOUNTER — Ambulatory Visit (INDEPENDENT_AMBULATORY_CARE_PROVIDER_SITE_OTHER): Payer: PPO | Admitting: Family Medicine

## 2019-03-01 DIAGNOSIS — I872 Venous insufficiency (chronic) (peripheral): Secondary | ICD-10-CM

## 2019-03-01 DIAGNOSIS — G4733 Obstructive sleep apnea (adult) (pediatric): Secondary | ICD-10-CM | POA: Diagnosis not present

## 2019-03-01 DIAGNOSIS — M545 Low back pain, unspecified: Secondary | ICD-10-CM

## 2019-03-01 DIAGNOSIS — I10 Essential (primary) hypertension: Secondary | ICD-10-CM | POA: Diagnosis not present

## 2019-03-01 DIAGNOSIS — I350 Nonrheumatic aortic (valve) stenosis: Secondary | ICD-10-CM | POA: Diagnosis not present

## 2019-03-01 NOTE — Telephone Encounter (Signed)
Copied from [place] [id number]. Topic: General - Other >> Mar 01, 2019 12:18 PM Celene Kras A wrote: Reason for CRM: Jackelyn Poling, from [employer], called on behalf of pt regarding paper work that was said to have been sent to them. Jackelyn Poling is requesting the paper work be faxed again because they have not received it. Jackelyn Poling is requesting the 7 elements and office notes from pts appt on 02/13/2019. Please advise.   Fax # [phone] Callback # [phone] Debbie states she also has access to community message in [place].   Patient will have a virtual visit with Dr. Charlett Blake

## 2019-03-01 NOTE — Assessment & Plan Note (Signed)
Results in weakness, unsteady gait and SOB intermittently

## 2019-03-01 NOTE — Assessment & Plan Note (Signed)
Elevate feet as able and minimize sodium

## 2019-03-01 NOTE — Assessment & Plan Note (Addendum)
Did a power wheelchair assessment today. He is essentially wheelchair bound and cannot ambulate for any significant period of time without recurrent falls due to unsteadiness and weakness. His home cannot accomodate a scooter. A cane, walker or manual wheelchair do not allow enough stability for manage to move around his home. He does have the mental capacity to operate a power wheelchair safely.

## 2019-03-01 NOTE — Assessment & Plan Note (Signed)
Affects his ability to ambulate.

## 2019-03-01 NOTE — Progress Notes (Signed)
Virtual Visit via Telephone Note  I connected with [name] on 03/01/19 at  3:00 PM EDT by a phone enabled telemedicine application and verified that I am speaking with the correct person using two identifiers.  Location: Patient: home Provider: office   I discussed the limitations of evaluation and management by telemedicine and the availability of in person appointments. The patient expressed understanding and agreed to proceed.    Subjective:    Patient ID: [name], male    DOB: [birth year], 78 y.o.   MRN: [id number]  No chief complaint on file.   HPI Patient is in today for evaluation of need for power wheelchair.  He has used 1 in the past and at this point he is unable to perform any activities of daily living without 1.  He needs the power wheelchair to help him with toileting, dressing, bathing, eating and other basic activities of daily living in his home.  He is unable to use a cane or walker or manual wheelchair as they are not stable enough to manage his weight.  He has significant difficulty with unsteadiness decreased range of motion in his arms backs and legs so that he is unable to move safely without falling if he does not have the wheelchair. He notes SOB with exertion which limits his movement. Denies CP/palp/HA/congestion/fevers/GI or GU c/o. Taking meds as prescribed  Past Medical History:  Diagnosis Date  . Anemia   . Aortic stenosis   . Aortic stenosis, severe    S/p Edwards Sapien 3 Transcatheter Heart Valve (size 26 mm, model # [id number], serial # [id number])  . Arthritis   . Back pain   . Bursitis   . Cataract    left immature  . Cellulitis 10/12/2015  . Complication of anesthesia    Halucinations  . Constipation    takes Miralax daily as well as Senokot daily  . DDD (degenerative disc disease)   . Gout    takes Allopurinol daily  . Heart valve disorder   . History of blood clots 1962   knee  . History of blood transfusion    no abnormal  reaction noted  . History of shingles   . Hyperlipidemia    takes Atorvastatin daily  . Hypertension    takes Lisinopril daily  . Joint pain   . Low back pain 01/25/2017  . Neck pain on left side 01/25/2017  . OSA (obstructive sleep apnea)   . Osteoarthritis   . Peripheral edema    takes Furosemide daily  . Peripheral neuropathy    takes Gabapentin daily  . Peroneal palsy    significant right foot drop  . Pneumonia 25+yrs ago   hx of  . Shortness of breath   . Swelling of extremity   . Urinary frequency   . Urinary urgency   . Valvular heart disease     Past Surgical History:  Procedure Laterality Date  . CARDIAC CATHETERIZATION N/A 05/02/2015   Procedure: Right/Left Heart Cath and Coronary Angiography;  Surgeon: Burnell Blanks, MD;  Location: [place] [facility];  Service: Cardiovascular;  Laterality: N/A;  . CARDIOVERSION N/A 10/27/2018   Procedure: CARDIOVERSION;  Surgeon: Fay Records, MD;  Location: [facility] ENDOSCOPY;  Service: Cardiovascular;  Laterality: N/A;  . COLONOSCOPY N/A 02/07/2013   Procedure: COLONOSCOPY;  Surgeon: Juanita Craver, MD;  Location: [facility] ENDOSCOPY;  Service: Endoscopy;  Laterality: N/A;  . COLONOSCOPY N/A 02/09/2013   Procedure: COLONOSCOPY;  Surgeon: Beryle Beams,  MD;  Location: [place] [facility]COPY;  Service: Endoscopy;  Laterality: N/A;  . ESOPHAGOGASTRODUODENOSCOPY N/A 02/07/2013   Procedure: ESOPHAGOGASTRODUODENOSCOPY (EGD);  Surgeon: Juanita Craver, MD;  Location: [facility] ENDOSCOPY;  Service: Endoscopy;  Laterality: N/A;  . GIVENS CAPSULE STUDY N/A 02/09/2013   Procedure: GIVENS CAPSULE STUDY;  Surgeon: Beryle Beams, MD;  Location: [place];  Service: Endoscopy;  Laterality: N/A;  . HERNIA REPAIR     umbilical hernia  . KNEE SURGERY Right    multiple knee surgeries due to complication of R TKA  . LAMINOTOMY  0258   c6-t2  . PACEMAKER IMPLANT N/A 04/27/2018   Procedure: PACEMAKER IMPLANT;  Surgeon: Constance Haw, MD;  Location: [place] [facility];   Service: Cardiovascular;  Laterality: N/A;  . SPINE SURGERY    . TEE WITHOUT CARDIOVERSION N/A 03/07/2015   Procedure: TRANSESOPHAGEAL ECHOCARDIOGRAM (TEE);  Surgeon: Josue Hector, MD;  Location: [facility] ENDOSCOPY;  Service: Cardiovascular;  Laterality: N/A;  ANES TO BRING PROPOFOL PER DOCTOR  . TEE WITHOUT CARDIOVERSION N/A 06/10/2015   Procedure: TRANSESOPHAGEAL ECHOCARDIOGRAM (TEE);  Surgeon: Burnell Blanks, MD;  Location: [place];  Service: Open Heart Surgery;  Laterality: N/A;  . TOTAL KNEE ARTHROPLASTY     right  . TRANSCATHETER AORTIC VALVE REPLACEMENT, TRANSFEMORAL Left 06/10/2015   Procedure: TRANSCATHETER AORTIC VALVE REPLACEMENT, TRANSFEMORAL;  Surgeon: Burnell Blanks, MD;  Location: [place];  Service: Open Heart Surgery;  Laterality: Left;    Family History  Problem Relation Age of Onset  . Other Father        POSSIBLE HEART ATTACK  . Arthritis Sister        "crippling"  . Arthritis Sister     Social History   Socioeconomic History  . Marital status: Married    Spouse name: [name]  . Number of children: 3  . Years of education: Not on file  . Highest education level: Not on file  Occupational History  . Occupation: retired Investment banker, operational  . Financial resource strain: Not on file  . Food insecurity    Worry: Not on file    Inability: Not on file  . Transportation needs    Medical: Not on file    Non-medical: Not on file  Tobacco Use  . Smoking status: Former Smoker    Years: 30.00    Types: Pipe    Quit date: 09/20/2006    Years since quitting: 12.4  . Smokeless tobacco: Never Used  Substance and Sexual Activity  . Alcohol use: No    Alcohol/week: 0.0 standard drinks  . Drug use: No  . Sexual activity: Never    Comment: lives with wife, retired, no dietary restrictions  Lifestyle  . Physical activity    Days per week: Not on file    Minutes per session: Not on file  . Stress: Not on file  Relationships  . Social Product manager on phone: Not on file    Gets together: Not on file    Attends religious service: Not on file    Active member of club or organization: Not on file    Attends meetings of clubs or organizations: Not on file    Relationship status: Not on file  . Intimate partner violence    Fear of current or ex partner: Not on file    Emotionally abused: Not on file    Physically abused: Not on file    Forced sexual activity: Not on  file  Other Topics Concern  . Not on file  Social History Narrative  . Not on file    Outpatient Medications Prior to Visit  Medication Sig Dispense Refill  . acetaminophen (TYLENOL) 500 MG tablet Take 1,000 mg by mouth 2 (two) times daily.     [name] ammonium lactate (LAC-HYDRIN) 12 % lotion Apply topically 3 (three) times daily. 400 g 0  . aspirin 81 MG EC tablet TAKE 1 TABLET (81 MG TOTAL) BY MOUTH DAILY. 30 tablet 11  . atorvastatin (LIPITOR) 40 MG tablet Take 1 and a half tablet daily by mouth  (60mg ) (Patient taking differently: Take 60 mg by mouth daily. Take 1 and a half tablet daily by mouth  (60mg )) 135 tablet 5  . ciprofloxacin (CIPRO) 500 MG tablet Take 1 tablet (500 mg total) by mouth 2 (two) times daily. Stop bactrim 20 tablet 0  . Coenzyme Q10 (COQ10 PO) Take 300 mg by mouth daily.     [name] ELDERBERRY PO Take 2 tablets by mouth daily.     [name] ELIQUIS 5 MG TABS tablet TAKE 1 TABLET BY MOUTH TWICE A DAY (Patient taking differently: Take 5 mg by mouth 2 (two) times daily. ) 60 tablet 6  . Ferrous Fumarate-Folic Acid (HEMATINIC/FOLIC ACID) 676-7 MG TABS Take 1 tablet by mouth 2 (two) times a week. 90 each 1  . furosemide (LASIX) 40 MG tablet TAKE 1 TABLET TWICE A DAY TILL 11/09/2018, AFTER THAT TAKE 1 TABLET DAILY. 30 tablet 0  . gabapentin (NEURONTIN) 300 MG capsule Take 1 capsule (300 mg total) by mouth 3 (three) times daily. 270 capsule 2  . hydrocerin (EUCERIN) CREA Apply 1 application topically 2 (two) times daily as needed (dry skin). 113 g 0  . hydrocortisone  (ANUSOL-HC) 2.5 % rectal cream Place rectally 2 (two) times daily. 30 g 0  . Menthol, Topical Analgesic, (BENGAY EX) Apply 1 application topically at bedtime.    . Misc Natural Products (TART CHERRY ADVANCED PO) Take 1,200 mg by mouth daily.     . Multiple Vitamin (MULTIVITAMIN WITH MINERALS) TABS Take 1 tablet by mouth daily.    . polyethylene glycol (MIRALAX / GLYCOLAX) packet Take 17 g by mouth daily.    . sennosides-docusate sodium (SENOKOT-S) 8.6-50 MG tablet Take 1 tablet by mouth 2 (two) times daily.    . vitamin C (ASCORBIC ACID) 500 MG tablet Take 500 mg by mouth daily.    [name] VITAMIN D PO Take 5,000 Units by mouth daily.      No facility-administered medications prior to visit.     Allergies  Allergen Reactions  . Coumadin [Warfarin Sodium] Other (See Comments)    States he can't be on this-bleeds out    Review of Systems  Constitutional: Negative for fever and malaise/fatigue.  HENT: Negative for congestion.   Eyes: Negative for blurred vision.  Respiratory: Positive for shortness of breath.   Cardiovascular: Positive for leg swelling. Negative for chest pain and palpitations.  Gastrointestinal: Negative for abdominal pain, blood in stool and nausea.  Genitourinary: Negative for dysuria and frequency.  Musculoskeletal: Positive for back pain, falls and joint pain.  Skin: Negative for rash.  Neurological: Positive for weakness. Negative for dizziness, loss of consciousness and headaches.  Endo/Heme/Allergies: Negative for environmental allergies.  Psychiatric/Behavioral: Negative for depression. The patient is not nervous/anxious.        Objective:    Physical Exam Unable to obtain due to telephone encounter during COVID epidemic  Subjective strength  in bilateral upper extremities 4/5 Subjective strength in bilateral lower extremities 2/5  Ht 6\' 2"  (1.88 m)   Wt (!) 340 lb (154.2 kg)   BMI 43.65 kg/m  Wt Readings from Last 3 Encounters:  03/01/19 (!) 340 lb  (154.2 kg)  02/13/19 (!) 340 lb (154.2 kg)  02/07/19 (!) 340 lb (154.2 kg)    Diabetic Foot Exam - Simple   No data filed     Lab Results  Component Value Date   WBC 7.7 10/31/2018   HGB 11.4 (L) 10/31/2018   HCT 38.3 (L) 10/31/2018   PLT 151 10/31/2018   GLUCOSE 89 11/28/2018   CHOL 140 08/22/2018   TRIG 126 08/22/2018   HDL 30 (L) 08/22/2018   LDLDIRECT 85.0 07/28/2017   LDLCALC 85 08/22/2018   ALT 28 11/28/2018   AST 23 11/28/2018   NA 139 11/28/2018   K 4.4 11/28/2018   CL 100 11/28/2018   CREATININE 1.09 11/28/2018   BUN 32 (H) 11/28/2018   CO2 32 11/28/2018   TSH 1.71 11/24/2017   PSA 22.91 (H) 11/28/2018   INR 1.32 06/10/2015   HGBA1C 5.6 08/22/2018    Lab Results  Component Value Date   TSH 1.71 11/24/2017   Lab Results  Component Value Date   WBC 7.7 10/31/2018   HGB 11.4 (L) 10/31/2018   HCT 38.3 (L) 10/31/2018   MCV 99.2 10/31/2018   PLT 151 10/31/2018   Lab Results  Component Value Date   NA 139 11/28/2018   K 4.4 11/28/2018   CO2 32 11/28/2018   GLUCOSE 89 11/28/2018   BUN 32 (H) 11/28/2018   CREATININE 1.09 11/28/2018   BILITOT 0.4 11/28/2018   ALKPHOS 74 11/28/2018   AST 23 11/28/2018   ALT 28 11/28/2018   PROT 6.0 11/28/2018   ALBUMIN 3.7 11/28/2018   CALCIUM 9.1 11/28/2018   ANIONGAP 10 11/09/2018   GFR 65.72 11/28/2018   Lab Results  Component Value Date   CHOL 140 08/22/2018   Lab Results  Component Value Date   HDL 30 (L) 08/22/2018   Lab Results  Component Value Date   LDLCALC 85 08/22/2018   Lab Results  Component Value Date   TRIG 126 08/22/2018   Lab Results  Component Value Date   CHOLHDL 4 11/24/2017   Lab Results  Component Value Date   HGBA1C 5.6 08/22/2018       Assessment & Plan:   Problem List Items Addressed This Visit    OBESITY, MORBID    Did a power wheelchair assessment today. He is essentially wheelchair bound and cannot ambulate for any significant period of time without recurrent  falls due to unsteadiness and weakness. His home cannot accomodate a scooter. A cane, walker or manual wheelchair do not allow enough stability for manage to move around his home. He does have the mental capacity to operate a power wheelchair safely.      Obstructive sleep apnea    Uses CPAP      Venous stasis dermatitis    Elevate feet as able and minimize sodium      Severe aortic valve stenosis    Results in weakness, unsteady gait and SOB intermittently      Low back pain    Affects his ability to ambulate.       Essential hypertension     no changes to meds. Encouraged heart healthy diet such as the DASH diet and exercise as tolerated.  I am having [place] D. Jagger maintain his VITAMIN D PO, sennosides-docusate sodium, multivitamin with minerals, acetaminophen, Coenzyme Q10 (COQ10 PO), polyethylene glycol, aspirin, Misc Natural Products (TART CHERRY ADVANCED PO), atorvastatin, gabapentin, Ferrous Fumarate-Folic Acid, ELDERBERRY PO, vitamin C, Eliquis, (Menthol, Topical Analgesic, (BENGAY EX)), ammonium lactate, hydrocerin, hydrocortisone, furosemide, and ciprofloxacin.  No orders of the defined types were placed in this encounter.   I discussed the assessment and treatment plan with the patient. The patient was provided an opportunity to ask questions and all were answered. The patient agreed with the plan and demonstrated an understanding of the instructions.   The patient was advised to call back or seek an in-person evaluation if the symptoms worsen or if the condition fails to improve as anticipated.  I provided 20 minutes of non-face-to-face time during this encounter.   Penni Homans, MD

## 2019-03-01 NOTE — Assessment & Plan Note (Signed)
Uses CPAP 

## 2019-03-01 NOTE — Assessment & Plan Note (Signed)
no changes to meds. Encouraged heart healthy diet such as the DASH diet and exercise as tolerated.  

## 2019-03-07 DIAGNOSIS — G4733 Obstructive sleep apnea (adult) (pediatric): Secondary | ICD-10-CM | POA: Diagnosis not present

## 2019-03-07 DIAGNOSIS — J449 Chronic obstructive pulmonary disease, unspecified: Secondary | ICD-10-CM | POA: Diagnosis not present

## 2019-03-13 ENCOUNTER — Ambulatory Visit: Payer: PPO | Admitting: Podiatry

## 2019-03-13 ENCOUNTER — Encounter: Payer: Self-pay | Admitting: Podiatry

## 2019-03-13 ENCOUNTER — Other Ambulatory Visit: Payer: Self-pay

## 2019-03-13 DIAGNOSIS — B351 Tinea unguium: Secondary | ICD-10-CM

## 2019-03-13 DIAGNOSIS — M79675 Pain in left toe(s): Secondary | ICD-10-CM

## 2019-03-13 DIAGNOSIS — I739 Peripheral vascular disease, unspecified: Secondary | ICD-10-CM | POA: Insufficient documentation

## 2019-03-13 DIAGNOSIS — M79674 Pain in right toe(s): Secondary | ICD-10-CM

## 2019-03-13 NOTE — Progress Notes (Signed)
This patient presents the office with chief complaint of long thick nails.  Nails are painful in his shoes.  Patient is unable to self treat.  Patient has had multiple vascular surgeries both feet.  Patient is taking Eliquis daily.  Patient presents the office today in a wheelchair for preventative foot care services.  Patient has previously been diagnosed with venous stasis dermatitis and chronic venous insufficiency.    General Appearance  Alert, conversant and in no acute stress.  Vascular  Dorsalis pedis and posterior tibial  pulses are not  palpable  Bilaterally due to severe foot/ankle swelling..  Capillary return is within normal limits  bilaterally. Temperature is within normal limits  Bilaterally.  Purplish feet noted especially digits  B/L.    Neurologic  Senn-Weinstein monofilament wire test diminished   bilaterally. Muscle power within normal limits bilaterally.  Nails Thick disfigured discolored nails with subungual debris  from hallux to fifth toes bilaterally. No evidence of bacterial infection or drainage bilaterally.  Orthopedic  No limitations of motion  feet .  No crepitus or effusions noted.  No bony pathology or digital deformities noted.  SKIN  Thin shiny skin  with no porokeratosis noted bilaterally.  No signs of infections or ulcers noted.    Onychomycosis  X 10.  Peripheral vascular disease.  IE.  Debride nails.    RTC 3 months for preventative foot care services.;   Gardiner Barefoot DPM

## 2019-03-14 NOTE — Progress Notes (Addendum)
Virtual Visit via Video Note  I connected with patient on 03/15/19 at  1:00 PM EDT by audio enabled telemedicine application and verified that I am speaking with the correct person using two identifiers.   THIS ENCOUNTER IS A VIRTUAL VISIT DUE TO COVID-19 - PATIENT WAS NOT SEEN IN THE OFFICE. PATIENT HAS CONSENTED TO VIRTUAL VISIT / TELEMEDICINE VISIT   Location of patient: home  Location of provider: office  I discussed the limitations of evaluation and management by telemedicine and the availability of in person appointments. The patient expressed understanding and agreed to proceed.   Subjective:   [name] is a 78 y.o. male who presents for Medicare Annual/Subsequent preventive examination.  Review of Systems: No ROS.  Medicare Wellness Virtual Visit.  Visual/audio telehealth visit, UTA vital signs.   See social history for additional risk factors. Cardiac Risk Factors include: advanced age (>54men, >39 women);obesity (BMI >30kg/m2);sedentary lifestyle;male gender;hypertension Sleep patterns: no issues. Wears CPAP. Home Safety/Smoke Alarms: Feels safe in home. Smoke alarms in place.  Lives with wife and son in one story home. Only takes sink bath. Uses walker or wheelchair.  Male:   CCS- declines    PSA-  Lab Results  Component Value Date   PSA 22.91 (H) 11/28/2018        Objective:     Advanced Directives 03/15/2019 10/28/2018 10/27/2018 04/26/2018 12/20/2017 02/04/2017 11/02/2016  Does Patient Have a Medical Advance Directive? Yes Yes Yes Yes Yes No Yes  Type of Paramedic of [place];Living will Living will;Healthcare Power of Attorney Living will;Healthcare Power of Attorney Living will [place];Living will - [place];Living will  Does patient want to make changes to medical advance directive? No - Patient declined No - Patient declined - No - Patient declined - - -  Copy of [place] in  Chart? No - copy requested - No - copy requested - No - copy requested - No - copy requested  Would patient like information on creating a medical advance directive? - - - - - - -  Pre-existing out of facility DNR order (yellow form or pink MOST form) - - - - - - -    Tobacco Social History   Tobacco Use  Smoking Status Former Smoker  . Years: 30.00  . Types: Pipe  . Quit date: 09/20/2006  . Years since quitting: 12.4  Smokeless Tobacco Never Used     Counseling given: Not Answered   Clinical Intake: Pain : No/denies pain   Past Medical History:  Diagnosis Date  . Anemia   . Aortic stenosis   . Aortic stenosis, severe    S/p Edwards Sapien 3 Transcatheter Heart Valve (size 26 mm, model # [id number], serial # [id number])  . Arthritis   . Back pain   . Bursitis   . Cataract    left immature  . Cellulitis 10/12/2015  . Complication of anesthesia    Halucinations  . Constipation    takes Miralax daily as well as Senokot daily  . DDD (degenerative disc disease)   . Gout    takes Allopurinol daily  . Heart valve disorder   . History of blood clots 1962   knee  . History of blood transfusion    no abnormal reaction noted  . History of shingles   . Hyperlipidemia    takes Atorvastatin daily  . Hypertension    takes Lisinopril daily  . Joint pain   .  Low back pain 01/25/2017  . Neck pain on left side 01/25/2017  . OSA (obstructive sleep apnea)   . Osteoarthritis   . Peripheral edema    takes Furosemide daily  . Peripheral neuropathy    takes Gabapentin daily  . Peroneal palsy    significant right foot drop  . Pneumonia 25+yrs ago   hx of  . Shortness of breath   . Swelling of extremity   . Urinary frequency   . Urinary urgency   . Valvular heart disease    Past Surgical History:  Procedure Laterality Date  . CARDIAC CATHETERIZATION N/A 05/02/2015   Procedure: Right/Left Heart Cath and Coronary Angiography;  Surgeon: Burnell Blanks, MD;  Location: [place] [facility];  Service: Cardiovascular;  Laterality: N/A;  . CARDIOVERSION N/A 10/27/2018   Procedure: CARDIOVERSION;  Surgeon: Fay Records, MD;  Location: [facility] ENDOSCOPY;  Service: Cardiovascular;  Laterality: N/A;  . COLONOSCOPY N/A 02/07/2013   Procedure: COLONOSCOPY;  Surgeon: Juanita Craver, MD;  Location: [facility] ENDOSCOPY;  Service: Endoscopy;  Laterality: N/A;  . COLONOSCOPY N/A 02/09/2013   Procedure: COLONOSCOPY;  Surgeon: Beryle Beams, MD;  Location: [place];  Service: Endoscopy;  Laterality: N/A;  . ESOPHAGOGASTRODUODENOSCOPY N/A 02/07/2013   Procedure: ESOPHAGOGASTRODUODENOSCOPY (EGD);  Surgeon: Juanita Craver, MD;  Location: [facility] ENDOSCOPY;  Service: Endoscopy;  Laterality: N/A;  . GIVENS CAPSULE STUDY N/A 02/09/2013   Procedure: GIVENS CAPSULE STUDY;  Surgeon: Beryle Beams, MD;  Location: [place];  Service: Endoscopy;  Laterality: N/A;  . HERNIA REPAIR     umbilical hernia  . KNEE SURGERY Right    multiple knee surgeries due to complication of R TKA  . LAMINOTOMY  5093   c6-t2  . PACEMAKER IMPLANT N/A 04/27/2018   Procedure: PACEMAKER IMPLANT;  Surgeon: Constance Haw, MD;  Location: [place] [facility];  Service: Cardiovascular;  Laterality: N/A;  . SPINE SURGERY    . TEE WITHOUT CARDIOVERSION N/A 03/07/2015   Procedure: TRANSESOPHAGEAL ECHOCARDIOGRAM (TEE);  Surgeon: Josue Hector, MD;  Location: [facility] END[facility]COPY;  Service: Cardiovascular;  Laterality: N/A;  ANES TO BRING PROPOFOL PER DOCTOR  . TEE WITHOUT CARDIOVERSION N/A 06/10/2015   Procedure: TRANSESOPHAGEAL ECHOCARDIOGRAM (TEE);  Surgeon: Burnell Blanks, MD;  Location: [place];  Service: Open Heart Surgery;  Laterality: N/A;  . TOTAL KNEE ARTHROPLASTY     right  . TRANSCATHETER AORTIC VALVE REPLACEMENT, TRANSFEMORAL Left 06/10/2015   Procedure: TRANSCATHETER AORTIC VALVE REPLACEMENT, TRANSFEMORAL;  Surgeon: Burnell Blanks, MD;  Location: [place];  Service: Open Heart Surgery;  Laterality: Left;   Family  History  Problem Relation Age of Onset  . Other Father        POSSIBLE HEART ATTACK  . Arthritis Sister        "crippling"  . Arthritis Sister    Social History   Socioeconomic History  . Marital status: Married    Spouse name: [name]  . Number of children: 3  . Years of education: Not on file  . Highest education level: Not on file  Occupational History  . Occupation: retired Investment banker, operational  . Financial resource strain: Not on file  . Food insecurity    Worry: Not on file    Inability: Not on file  . Transportation needs    Medical: Not on file    Non-medical: Not on file  Tobacco Use  . Smoking status: Former Smoker    Years: 30.00  Types: Pipe    Quit date: 09/20/2006    Years since quitting: 12.4  . Smokeless tobacco: Never Used  Substance and Sexual Activity  . Alcohol use: No    Alcohol/week: 0.0 standard drinks  . Drug use: No  . Sexual activity: Never    Comment: lives with wife, retired, no dietary restrictions  Lifestyle  . Physical activity    Days per week: Not on file    Minutes per session: Not on file  . Stress: Not on file  Relationships  . Social Herbalist on phone: Not on file    Gets together: Not on file    Attends religious service: Not on file    Active member of club or organization: Not on file    Attends meetings of clubs or organizations: Not on file    Relationship status: Not on file  Other Topics Concern  . Not on file  Social History Narrative  . Not on file    Outpatient Encounter Medications as of 03/15/2019  Medication Sig  . acetaminophen (TYLENOL) 500 MG tablet Take 1,000 mg by mouth 2 (two) times daily.   [name] ammonium lactate (LAC-HYDRIN) 12 % lotion Apply topically 3 (three) times daily.  [name] aspirin 81 MG EC tablet TAKE 1 TABLET (81 MG TOTAL) BY MOUTH DAILY.  [name] atorvastatin (LIPITOR) 40 MG tablet Take 1 and a half tablet daily by mouth  (60mg ) (Patient taking differently: Take 60 mg by mouth daily.  Take 1 and a half tablet daily by mouth  (60mg ))  . Coenzyme Q10 (COQ10 PO) Take 300 mg by mouth daily.   [name] ELIQUIS 5 MG TABS tablet TAKE 1 TABLET BY MOUTH TWICE A DAY (Patient taking differently: Take 5 mg by mouth 2 (two) times daily. )  . Ferrous Fumarate-Folic Acid (HEMATINIC/FOLIC ACID) 381-8 MG TABS Take 1 tablet by mouth 2 (two) times a week.  . furosemide (LASIX) 20 MG tablet 40 mg.   . gabapentin (NEURONTIN) 300 MG capsule Take 1 capsule (300 mg total) by mouth 3 (three) times daily.  . hydrocerin (EUCERIN) CREA Apply 1 application topically 2 (two) times daily as needed (dry skin).  . hydrocortisone (ANUSOL-HC) 2.5 % rectal cream Place rectally 2 (two) times daily.  . Menthol, Topical Analgesic, (BENGAY EX) Apply 1 application topically at bedtime.  . Misc Natural Products (TART CHERRY ADVANCED PO) Take 1,200 mg by mouth daily.   . Multiple Vitamin (MULTIVITAMIN WITH MINERALS) TABS Take 1 tablet by mouth daily.  . polyethylene glycol (MIRALAX / GLYCOLAX) packet Take 17 g by mouth daily.  . sennosides-docusate sodium (SENOKOT-S) 8.6-50 MG tablet Take 1 tablet by mouth 2 (two) times daily.  . vitamin C (ASCORBIC ACID) 500 MG tablet Take 500 mg by mouth daily.  [name] VITAMIN D PO Take 5,000 Units by mouth daily.   [name] ELDERBERRY PO Take 2 tablets by mouth daily.   . [DISCONTINUED] ciprofloxacin (CIPRO) 500 MG tablet Take 1 tablet (500 mg total) by mouth 2 (two) times daily. Stop bactrim   No facility-administered encounter medications on file as of 03/15/2019.     Activities of Daily Living In your present state of health, do you have any difficulty performing the following activities: 03/15/2019 10/28/2018  Hearing? N N  Vision? N N  Difficulty concentrating or making decisions? N N  Walking or climbing stairs? N N  Dressing or bathing? N N  Comment - -  Doing errands, shopping? N N  Preparing Food and eating ? Y -  Comment wife prepares food -  Using the Toilet? N -  In the past six  months, have you accidently leaked urine? N -  Do you have problems with loss of bowel control? N -  Managing your Medications? N -  Managing your Finances? Y -  Comment wife -  Housekeeping or managing your Housekeeping? Y -  Comment wife -  Some recent data might be hidden    Patient Care Team: [name], MD as PCP - General (Family Medicine) Constance Haw, MD as PCP - Electrophysiology (Cardiology) Josue Hector, MD as PCP - Cardiology (Cardiology) Josue Hector, MD as Consulting Physician (Cardiology)   Assessment:   This is a routine wellness examination for [place]. Physical assessment deferred to PCP.  Exercise Activities and Dietary recommendations Current Exercise Habits: The patient does not participate in regular exercise at present, Exercise limited by: orthopedic condition(s)   Diet (meal preparation, eat out, water intake, caffeinated beverages, dairy products, fruits and vegetables):  Breakfast: 2 pc french toast w/ sugar free syrup Lunch: low sodium [place] sandwich w/ fruit Dinner: low sodium meals Drinks plenty of water.  Goals    . Weight (lb) < 350 lb (158.8 kg)     Now see Dr.Beasly-follow diet and begin exercise       Fall Risk Fall Risk  03/15/2019 03/28/2018 12/20/2017 11/02/2016 07/29/2016  Falls in the past year? 1 No No No No  Number falls in past yr: 0 - - - -  Injury with Fall? 0 - - - -    Depression Screen PHQ 2/9 Scores 03/15/2019 03/28/2018 12/20/2017 12/06/2017  PHQ - 2 Score 0 0 0 1  PHQ- 9 Score - - - 8    Cognitive Function Ad8 score reviewed for issues:  Issues making decisions:no   Less interest in hobbies / activities:no  Repeats questions, stories (family complaining):no  Trouble using ordinary gadgets (microwave, computer, phone):no  Forgets the month or year: no  Mismanaging finances: no  Remembering appts:no Daily problems with thinking and/or memory:no Ad8 score is= 0     MMSE - Mini Mental State Exam  11/02/2016  Orientation to time 5  Orientation to Place 5  Registration 3  Attention/ Calculation 5  Recall 2  Language- name 2 objects 2  Language- repeat 1  Language- follow 3 step command 3  Language- read & follow direction 1  Write a sentence 1  Copy design 1  Total score 29        Immunization History  Administered Date(s) Administered  . Influenza Split 07/08/2011, 06/20/2012  . Influenza, High Dose Seasonal PF 06/22/2016, 07/06/2017, 06/09/2018  . Influenza,inj,Quad PF,6+ Mos 06/07/2013, 07/29/2014, 07/18/2015  . Influenza-Unspecified 06/26/2017  . PPD Test 02/16/2013  . Pneumococcal Conjugate-13 10/11/2013  . Pneumococcal Polysaccharide-23 11/07/2015  . Td 08/26/2006, 03/28/2018  . Zoster 07/27/2013  . Zoster Recombinat (Shingrix) 11/08/2018    Screening Tests Health Maintenance  Topic Date Due  . INFLUENZA VACCINE  04/21/2019  . TETANUS/TDAP  03/28/2028  . PNA vac Low Risk Adult  Completed       Plan:   See you next year!  Continue to eat heart healthy diet (full of fruits, vegetables, whole grains, lean protein, water--limit salt, fat, and sugar intake) and increase physical activity as tolerated.  Continue doing brain stimulating activities (puzzles, reading, adult coloring books, staying active) to keep memory sharp.   Bring a copy of your  living will and/or healthcare power of attorney to your next office visit.   I have personally reviewed and noted the following in the patient's chart:   . Medical and social history . Use of alcohol, tobacco or illicit drugs  . Current medications and supplements . Functional ability and status . Nutritional status . Physical activity . Advanced directives . List of other physicians . Hospitalizations, surgeries, and ER visits in previous 12 months . Vitals . Screenings to include cognitive, depression, and falls . Referrals and appointments  In addition, I have reviewed and discussed with patient certain  preventive protocols, quality metrics, and best practice recommendations. A written personalized care plan for preventive services as well as general preventive health recommendations were provided to patient.     Shela Nevin, [place]  03/15/2019  Medical screening examination/treatment was performed by qualified clinical staff member and as supervising physician I was immediately available for consultation/collaboration. I have reviewed documentation and agree with assessment and plan.  Penni Homans, MD

## 2019-03-15 ENCOUNTER — Ambulatory Visit (INDEPENDENT_AMBULATORY_CARE_PROVIDER_SITE_OTHER): Payer: PPO | Admitting: *Deleted

## 2019-03-15 ENCOUNTER — Encounter: Payer: Self-pay | Admitting: *Deleted

## 2019-03-15 ENCOUNTER — Other Ambulatory Visit: Payer: Self-pay

## 2019-03-15 ENCOUNTER — Other Ambulatory Visit: Payer: PPO

## 2019-03-15 DIAGNOSIS — Z0000 Encounter for general adult medical examination without abnormal findings: Secondary | ICD-10-CM

## 2019-03-15 NOTE — Patient Instructions (Signed)
See you next year!  Continue to eat heart healthy diet (full of fruits, vegetables, whole grains, lean protein, water--limit salt, fat, and sugar intake) and increase physical activity as tolerated.  Continue doing brain stimulating activities (puzzles, reading, adult coloring books, staying active) to keep memory sharp.   Bring a copy of your living will and/or healthcare power of attorney to your next office visit.   Mr. [name] , Thank you for taking time to come for your Medicare Wellness Visit. I appreciate your ongoing commitment to your health goals. Please review the following plan we discussed and let me know if I can assist you in the future.   These are the goals we discussed: Goals    . Weight (lb) < 350 lb (158.8 kg)     Now see Dr.Beasly-follow diet and begin exercise       This is a list of the screening recommended for you and due dates:  Health Maintenance  Topic Date Due  . Flu Shot  04/21/2019  . Tetanus Vaccine  03/28/2028  . Pneumonia vaccines  Completed    Health Maintenance After Age 15 After age 88, you are at a higher risk for certain long-term diseases and infections as well as injuries from falls. Falls are a major cause of broken bones and head injuries in people who are older than age 43. Getting regular preventive care can help to keep you healthy and well. Preventive care includes getting regular testing and making lifestyle changes as recommended by your health care provider. Talk with your health care provider about:  Which screenings and tests you should have. A screening is a test that checks for a disease when you have no symptoms.  A diet and exercise plan that is right for you. What should I know about screenings and tests to prevent falls? Screening and testing are the best ways to find a health problem early. Early diagnosis and treatment give you the best chance of managing medical conditions that are common after age 62. Certain conditions  and lifestyle choices may make you more likely to have a fall. Your health care provider may recommend:  Regular vision checks. Poor vision and conditions such as cataracts can make you more likely to have a fall. If you wear glasses, make sure to get your prescription updated if your vision changes.  Medicine review. Work with your health care provider to regularly review all of the medicines you are taking, including over-the-counter medicines. Ask your health care provider about any side effects that may make you more likely to have a fall. Tell your health care provider if any medicines that you take make you feel dizzy or sleepy.  Osteoporosis screening. Osteoporosis is a condition that causes the bones to get weaker. This can make the bones weak and cause them to break more easily.  Blood pressure screening. Blood pressure changes and medicines to control blood pressure can make you feel dizzy.  Strength and balance checks. Your health care provider may recommend certain tests to check your strength and balance while standing, walking, or changing positions.  Foot health exam. Foot pain and numbness, as well as not wearing proper footwear, can make you more likely to have a fall.  Depression screening. You may be more likely to have a fall if you have a fear of falling, feel emotionally low, or feel unable to do activities that you used to do.  Alcohol use screening. Using too much alcohol can affect your  balance and may make you more likely to have a fall. What actions can I take to lower my risk of falls? General instructions  Talk with your health care provider about your risks for falling. Tell your health care provider if: ? You fall. Be sure to tell your health care provider about all falls, even ones that seem minor. ? You feel dizzy, sleepy, or off-balance.  Take over-the-counter and prescription medicines only as told by your health care provider. These include any supplements.   Eat a healthy diet and maintain a healthy weight. A healthy diet includes low-fat dairy products, low-fat (lean) meats, and fiber from whole grains, beans, and lots of fruits and vegetables. Home safety  Remove any tripping hazards, such as rugs, cords, and clutter.  Install safety equipment such as grab bars in bathrooms and safety rails on stairs.  Keep rooms and walkways well-lit. Activity   Follow a regular exercise program to stay fit. This will help you maintain your balance. Ask your health care provider what types of exercise are appropriate for you.  If you need a cane or walker, use it as recommended by your health care provider.  Wear supportive shoes that have nonskid soles. Lifestyle  Do not drink alcohol if your health care provider tells you not to drink.  If you drink alcohol, limit how much you have: ? 0-1 drink a day for women. ? 0-2 drinks a day for men.  Be aware of how much alcohol is in your drink. In the [place] one drink equals one typical bottle of beer (12 oz), one-half glass of wine (5 oz), or one shot of hard liquor (1 oz).  Do not use any products that contain nicotine or tobacco, such as cigarettes and e-cigarettes. If you need help quitting, ask your health care provider. Summary  Having a healthy lifestyle and getting preventive care can help to protect your health and wellness after age 72.  Screening and testing are the best way to find a health problem early and help you avoid having a fall. Early diagnosis and treatment give you the best chance for managing medical conditions that are more common for people who are older than age 7.  Falls are a major cause of broken bones and head injuries in people who are older than age 40. Take precautions to prevent a fall at home.  Work with your health care provider to learn what changes you can make to improve your health and wellness and to prevent falls. This information is not intended to replace  advice given to you by your health care provider. Make sure you discuss any questions you have with your health care provider. Document Released: 07/20/2017 Document Revised: 07/20/2017 Document Reviewed: 07/20/2017 Elsevier Interactive Patient Education  2019 [employer].

## 2019-03-18 DIAGNOSIS — R062 Wheezing: Secondary | ICD-10-CM | POA: Diagnosis not present

## 2019-03-18 DIAGNOSIS — G4733 Obstructive sleep apnea (adult) (pediatric): Secondary | ICD-10-CM | POA: Diagnosis not present

## 2019-03-19 ENCOUNTER — Other Ambulatory Visit (INDEPENDENT_AMBULATORY_CARE_PROVIDER_SITE_OTHER): Payer: PPO

## 2019-03-19 ENCOUNTER — Other Ambulatory Visit: Payer: Self-pay

## 2019-03-19 DIAGNOSIS — E7800 Pure hypercholesterolemia, unspecified: Secondary | ICD-10-CM

## 2019-03-19 DIAGNOSIS — R739 Hyperglycemia, unspecified: Secondary | ICD-10-CM

## 2019-03-19 DIAGNOSIS — I10 Essential (primary) hypertension: Secondary | ICD-10-CM

## 2019-03-19 DIAGNOSIS — R972 Elevated prostate specific antigen [PSA]: Secondary | ICD-10-CM | POA: Diagnosis not present

## 2019-03-19 DIAGNOSIS — N390 Urinary tract infection, site not specified: Secondary | ICD-10-CM

## 2019-03-19 DIAGNOSIS — R319 Hematuria, unspecified: Secondary | ICD-10-CM | POA: Diagnosis not present

## 2019-03-19 LAB — URINALYSIS, ROUTINE W REFLEX MICROSCOPIC
Bilirubin Urine: NEGATIVE
Ketones, ur: NEGATIVE
Leukocytes,Ua: NEGATIVE
Nitrite: NEGATIVE
Specific Gravity, Urine: 1.015 (ref 1.000–1.030)
Total Protein, Urine: NEGATIVE
Urine Glucose: NEGATIVE
Urobilinogen, UA: 0.2 (ref 0.0–1.0)
pH: 6 (ref 5.0–8.0)

## 2019-03-19 LAB — CBC
HCT: 37.2 % — ABNORMAL LOW (ref 39.0–52.0)
Hemoglobin: 11.7 g/dL — ABNORMAL LOW (ref 13.0–17.0)
MCHC: 31.5 g/dL (ref 30.0–36.0)
MCV: 92.6 fl (ref 78.0–100.0)
Platelets: 140 10*3/uL — ABNORMAL LOW (ref 150.0–400.0)
RBC: 4.02 Mil/uL — ABNORMAL LOW (ref 4.22–5.81)
RDW: 17.2 % — ABNORMAL HIGH (ref 11.5–15.5)
WBC: 6.5 10*3/uL (ref 4.0–10.5)

## 2019-03-19 LAB — PSA: PSA: 24.63 ng/mL — ABNORMAL HIGH (ref 0.10–4.00)

## 2019-03-19 LAB — TSH: TSH: 1.19 u[IU]/mL (ref 0.35–4.50)

## 2019-03-19 LAB — HEMOGLOBIN A1C: Hgb A1c MFr Bld: 5.5 % (ref 4.6–6.5)

## 2019-03-20 LAB — LIPID PANEL
Cholesterol: 117 mg/dL (ref 0–200)
HDL: 29.5 mg/dL — ABNORMAL LOW (ref >39.00)
LDL Cholesterol: 60 mg/dL (ref 0–99)
NonHDL: 87.31
Total CHOL/HDL Ratio: 4
Triglycerides: 135 mg/dL (ref 0.0–149.0)
VLDL: 27 mg/dL (ref 0.0–40.0)

## 2019-03-20 LAB — COMPREHENSIVE METABOLIC PANEL
ALT: 17 U/L (ref 0–53)
AST: 18 U/L (ref 0–37)
Albumin: 3.7 g/dL (ref 3.5–5.2)
Alkaline Phosphatase: 76 U/L (ref 39–117)
BUN: 27 mg/dL — ABNORMAL HIGH (ref 6–23)
CO2: 32 mEq/L (ref 19–32)
Calcium: 8.7 mg/dL (ref 8.4–10.5)
Chloride: 100 mEq/L (ref 96–112)
Creatinine, Ser: 1.01 mg/dL (ref 0.40–1.50)
GFR: 71.7 mL/min (ref >60.00)
Glucose, Bld: 91 mg/dL (ref 70–99)
Potassium: 4.2 mEq/L (ref 3.5–5.1)
Sodium: 141 mEq/L (ref 135–145)
Total Bilirubin: 0.3 mg/dL (ref 0.2–1.2)
Total Protein: 6.1 g/dL (ref 6.0–8.3)

## 2019-03-21 ENCOUNTER — Telehealth: Payer: Self-pay | Admitting: Cardiovascular Disease

## 2019-03-21 DIAGNOSIS — R972 Elevated prostate specific antigen [PSA]: Secondary | ICD-10-CM

## 2019-03-21 DIAGNOSIS — N419 Inflammatory disease of prostate, unspecified: Secondary | ICD-10-CM

## 2019-03-21 LAB — URINE CULTURE
MICRO NUMBER:: 617132
Result:: NO GROWTH
SPECIMEN QUALITY:: ADEQUATE

## 2019-03-21 NOTE — Telephone Encounter (Signed)
Can refer to [facility] for elevated PSA and prostatitis

## 2019-03-21 NOTE — Telephone Encounter (Signed)
Spoke with patient and advised him that referral has been placed to Dr. Louis Meckel at [facility]. Patient verbalized gratitude for our help.

## 2019-03-21 NOTE — Telephone Encounter (Signed)
Spoke with patient who states during his telephone visit with Dr. [name], he mentioned the name of urologist and patient would like a referral to that person. I advised I will forward message to Dr. [name] and call patient back with recommendation. Patient verbalized understanding and agreement and thanked me for the call.

## 2019-03-21 NOTE — Telephone Encounter (Signed)
New message:   Patient calling stating that he need a referral that Dr. [name] has for him urologist. Please call patient back.

## 2019-03-22 DIAGNOSIS — C4442 Squamous cell carcinoma of skin of scalp and neck: Secondary | ICD-10-CM | POA: Diagnosis not present

## 2019-03-22 DIAGNOSIS — D485 Neoplasm of uncertain behavior of skin: Secondary | ICD-10-CM | POA: Diagnosis not present

## 2019-03-22 DIAGNOSIS — L570 Actinic keratosis: Secondary | ICD-10-CM | POA: Diagnosis not present

## 2019-03-22 DIAGNOSIS — J9622 Acute and chronic respiratory failure with hypercapnia: Secondary | ICD-10-CM | POA: Diagnosis not present

## 2019-03-22 DIAGNOSIS — R062 Wheezing: Secondary | ICD-10-CM | POA: Diagnosis not present

## 2019-03-22 DIAGNOSIS — J449 Chronic obstructive pulmonary disease, unspecified: Secondary | ICD-10-CM | POA: Diagnosis not present

## 2019-03-22 DIAGNOSIS — G4733 Obstructive sleep apnea (adult) (pediatric): Secondary | ICD-10-CM | POA: Diagnosis not present

## 2019-03-22 DIAGNOSIS — L821 Other seborrheic keratosis: Secondary | ICD-10-CM | POA: Diagnosis not present

## 2019-03-29 DIAGNOSIS — R972 Elevated prostate specific antigen [PSA]: Secondary | ICD-10-CM | POA: Diagnosis not present

## 2019-04-02 ENCOUNTER — Telehealth: Payer: Self-pay | Admitting: Cardiovascular Disease

## 2019-04-02 ENCOUNTER — Other Ambulatory Visit: Payer: Self-pay | Admitting: Urology

## 2019-04-02 ENCOUNTER — Other Ambulatory Visit (HOSPITAL_COMMUNITY): Payer: Self-pay | Admitting: Urology

## 2019-04-02 DIAGNOSIS — R972 Elevated prostate specific antigen [PSA]: Secondary | ICD-10-CM

## 2019-04-02 NOTE — Telephone Encounter (Signed)
° °  [facility] Pre-operative Risk Assessment    Request for surgical clearance:  1. What type of surgery is being performed?  Prostate Ultrasound and Biopsy   2. When is this surgery scheduled? 04-12-19   3. What type of clearance is required (medical clearance vs. Pharmacy clearance to hold med vs. Both)? Both  4. Are there any medications that need to be held prior to surgery and how long? Eliquis   5. Practice name and name of physician performing surgery?  Dr Louis Meckel  6. What is your office phone number [phone]    7.   What is your office fax number [phone]  8.   Anesthesia type (None, local, MAC, general) ?  General   [name] 04/02/2019, 10:31 AM  _________________________________________________________________   (provider comments below)

## 2019-04-02 NOTE — Telephone Encounter (Signed)
Pt takes Eliquis for afib with CHADS2VASc score of 4 (age x2, HTN, PVD), also with history of DVT in 1962. Renal function is normal. Ok to hold Eliquis for 2 days prior to biopsy.

## 2019-04-02 NOTE — Telephone Encounter (Signed)
Pharm please address eliquis thanks 

## 2019-04-04 NOTE — Telephone Encounter (Signed)
   Primary Cardiologist: Jenkins Rouge, MD  Chart reviewed as part of pre-operative protocol coverage. Patient was contacted 04/04/2019 in reference to pre-operative risk assessment for pending surgery as outlined below.  [name] was last seen on 02/07/19 by Dr. [name] for hx of TAVR, Pacemaker for heart block and atrial fib, MV stenosis, also OSA with CPAP and COPD.  Since that day, [name] has done well with no change in physical activity. No chest pain or Sob.     Therefore, based on ACC/AHA guidelines, the patient would be at acceptable risk for the planned procedure without further cardiovascular testing.  He may hold eliquis 2 days prior to biopsy and resume post op when safe.    I will route this recommendation to the requesting party via Epic fax function and remove from pre-op pool.  Please call with questions.  Cecilie Kicks, NP 04/04/2019, 10:57 AM

## 2019-04-09 ENCOUNTER — Other Ambulatory Visit (HOSPITAL_COMMUNITY)
Admission: RE | Admit: 2019-04-09 | Discharge: 2019-04-09 | Disposition: A | Discharge status: Home / Self Care | Payer: PPO | Source: Ambulatory Visit | Attending: Urology | Admitting: Urology

## 2019-04-09 DIAGNOSIS — Z1159 Encounter for screening for other viral diseases: Secondary | ICD-10-CM | POA: Insufficient documentation

## 2019-04-10 ENCOUNTER — Encounter (HOSPITAL_COMMUNITY): Payer: Self-pay

## 2019-04-10 LAB — SARS CORONAVIRUS 2 (TAT 6-24 HRS): SARS Coronavirus 2: NEGATIVE

## 2019-04-10 NOTE — Progress Notes (Signed)
Faxed Pacer maker order set to [facility] care.  The following are in epic: Cardiac Clearance Dr. [name] 04/02/2019 HGB A1C (5.5) 03/19/2019 EKG 11/09/2018 ECHO 02/07/2019 Last pacer device check 02/06/2019 CXR 10/27/2018

## 2019-04-10 NOTE — Patient Instructions (Addendum)
DUE TO COVID-19 ONLY ONE VISITOR IS ALLOWED IN THE HOSPITAL AT THIS TIME   COVID SWAB TESTING COMPLETED ON:   April 09, 2019 (Must self quarantine after testing. Follow instructions on handout.)   Your procedure is scheduled on: Tomorrow, April 12, 2019    Surgery Time:  11:30AM-12:30PM   Report to [facility]  Entrance    Report to admitting at 9:30AM    Call this number if you have problems the morning of surgery [phone]   Bring CPAP mask and tubing day of surgery   Do not eat food or drink liquids :After Midnight.   Brush your teeth the morning of surgery.   Do NOT smoke after Midnight   Take these medicines the morning of surgery with A SIP OF WATER: Atorvastatin, Gabapentin                               You may not have any metal on your body including jewelry, and body piercings             Do not wear lotions, powders, perfumes/cologne, or deodorant                           Men may shave face and neck.   Do not bring valuables to the hospital. [place].   Contacts, dentures or bridgework may not be worn into surgery.   Patients discharged the day of surgery will not be allowed to drive home.   Name and phone number of your driver:                Please read over the following fact sheets you were given:  [facility] - Preparing for Surgery Before surgery, you can play an important role.  Because skin is not sterile, your skin needs to be as free of germs as possible.  You can reduce the number of germs on your skin by washing with CHG (chlorahexidine gluconate) soap before surgery.  CHG is an antiseptic cleaner which kills germs and bonds with the skin to continue killing germs even after washing. Please DO NOT use if you have an allergy to CHG or antibacterial soaps.  If your skin becomes reddened/irritated stop using the CHG and inform your nurse when you arrive at Short Stay. Do not shave  (including legs and underarms) for at least 48 hours prior to the first CHG shower.  You may shave your face/neck.  Please follow these instructions carefully:  1.  Shower with CHG Soap the night before surgery and the  morning of surgery.  2.  If you choose to wash your hair, wash your hair first as usual with your normal  shampoo.  3.  After you shampoo, rinse your hair and body thoroughly to remove the shampoo.                             4.  Use CHG as you would any other liquid soap.  You can apply chg directly to the skin and wash.  Gently with a scrungie or clean washcloth.  5.  Apply the CHG Soap to your body ONLY FROM THE NECK DOWN.   Do   not use on face/  open                           Wound or open sores. Avoid contact with eyes, ears mouth and   genitals (private parts).                       Wash face,  Genitals (private parts) with your normal soap.             6.  Wash thoroughly, paying special attention to the area where your    surgery  will be performed.  7.  Thoroughly rinse your body with warm water from the neck down.  8.  DO NOT shower/wash with your normal soap after using and rinsing off the CHG Soap.                9.  Pat yourself dry with a clean towel.            10.  Wear clean pajamas.            11.  Place clean sheets on your bed the night of your first shower and do not  sleep with pets. Day of Surgery : Do not apply any lotions/deodorants the morning of surgery.  Please wear clean clothes to the hospital/surgery center.  FAILURE TO FOLLOW THESE INSTRUCTIONS MAY RESULT IN THE CANCELLATION OF YOUR SURGERY  PATIENT SIGNATURE_________________________________  NURSE SIGNATURE__________________________________  ________________________________________________________________________

## 2019-04-11 ENCOUNTER — Other Ambulatory Visit: Payer: Self-pay

## 2019-04-11 ENCOUNTER — Encounter (HOSPITAL_COMMUNITY): Payer: Self-pay

## 2019-04-11 ENCOUNTER — Encounter (HOSPITAL_COMMUNITY)
Admission: RE | Admit: 2019-04-11 | Discharge: 2019-04-11 | Disposition: A | Discharge status: Home / Self Care | Payer: PPO | Source: Ambulatory Visit | Attending: Urology | Admitting: Urology

## 2019-04-11 DIAGNOSIS — I10 Essential (primary) hypertension: Secondary | ICD-10-CM | POA: Insufficient documentation

## 2019-04-11 DIAGNOSIS — I4510 Unspecified right bundle-branch block: Secondary | ICD-10-CM | POA: Insufficient documentation

## 2019-04-11 DIAGNOSIS — D649 Anemia, unspecified: Secondary | ICD-10-CM | POA: Insufficient documentation

## 2019-04-11 DIAGNOSIS — I440 Atrioventricular block, first degree: Secondary | ICD-10-CM | POA: Insufficient documentation

## 2019-04-11 DIAGNOSIS — R972 Elevated prostate specific antigen [PSA]: Secondary | ICD-10-CM | POA: Insufficient documentation

## 2019-04-11 DIAGNOSIS — I080 Rheumatic disorders of both mitral and aortic valves: Secondary | ICD-10-CM | POA: Insufficient documentation

## 2019-04-11 DIAGNOSIS — Z8042 Family history of malignant neoplasm of prostate: Secondary | ICD-10-CM | POA: Diagnosis not present

## 2019-04-11 DIAGNOSIS — Z79899 Other long term (current) drug therapy: Secondary | ICD-10-CM | POA: Insufficient documentation

## 2019-04-11 DIAGNOSIS — Z6841 Body Mass Index (BMI) 40.0 and over, adult: Secondary | ICD-10-CM | POA: Insufficient documentation

## 2019-04-11 DIAGNOSIS — R600 Localized edema: Secondary | ICD-10-CM | POA: Insufficient documentation

## 2019-04-11 DIAGNOSIS — K5900 Constipation, unspecified: Secondary | ICD-10-CM | POA: Diagnosis not present

## 2019-04-11 DIAGNOSIS — Z952 Presence of prosthetic heart valve: Secondary | ICD-10-CM | POA: Insufficient documentation

## 2019-04-11 DIAGNOSIS — C61 Malignant neoplasm of prostate: Secondary | ICD-10-CM | POA: Diagnosis not present

## 2019-04-11 DIAGNOSIS — Z01812 Encounter for preprocedural laboratory examination: Secondary | ICD-10-CM | POA: Insufficient documentation

## 2019-04-11 DIAGNOSIS — E785 Hyperlipidemia, unspecified: Secondary | ICD-10-CM | POA: Insufficient documentation

## 2019-04-11 DIAGNOSIS — I110 Hypertensive heart disease with heart failure: Secondary | ICD-10-CM | POA: Diagnosis not present

## 2019-04-11 DIAGNOSIS — I4819 Other persistent atrial fibrillation: Secondary | ICD-10-CM | POA: Diagnosis not present

## 2019-04-11 DIAGNOSIS — G4730 Sleep apnea, unspecified: Secondary | ICD-10-CM | POA: Diagnosis not present

## 2019-04-11 DIAGNOSIS — R310 Gross hematuria: Secondary | ICD-10-CM | POA: Diagnosis not present

## 2019-04-11 DIAGNOSIS — Z7982 Long term (current) use of aspirin: Secondary | ICD-10-CM | POA: Insufficient documentation

## 2019-04-11 DIAGNOSIS — J449 Chronic obstructive pulmonary disease, unspecified: Secondary | ICD-10-CM | POA: Diagnosis not present

## 2019-04-11 DIAGNOSIS — Z96651 Presence of right artificial knee joint: Secondary | ICD-10-CM | POA: Insufficient documentation

## 2019-04-11 DIAGNOSIS — I509 Heart failure, unspecified: Secondary | ICD-10-CM | POA: Diagnosis not present

## 2019-04-11 DIAGNOSIS — Z87891 Personal history of nicotine dependence: Secondary | ICD-10-CM | POA: Insufficient documentation

## 2019-04-11 DIAGNOSIS — G629 Polyneuropathy, unspecified: Secondary | ICD-10-CM | POA: Insufficient documentation

## 2019-04-11 DIAGNOSIS — Z7901 Long term (current) use of anticoagulants: Secondary | ICD-10-CM | POA: Insufficient documentation

## 2019-04-11 DIAGNOSIS — M109 Gout, unspecified: Secondary | ICD-10-CM | POA: Insufficient documentation

## 2019-04-11 DIAGNOSIS — G4733 Obstructive sleep apnea (adult) (pediatric): Secondary | ICD-10-CM | POA: Insufficient documentation

## 2019-04-11 DIAGNOSIS — R319 Hematuria, unspecified: Secondary | ICD-10-CM | POA: Insufficient documentation

## 2019-04-11 DIAGNOSIS — I739 Peripheral vascular disease, unspecified: Secondary | ICD-10-CM | POA: Diagnosis not present

## 2019-04-11 HISTORY — DX: Heart failure, unspecified: I50.9

## 2019-04-11 HISTORY — DX: Thrombocytopenia, unspecified: D69.6

## 2019-04-11 HISTORY — DX: Other persistent atrial fibrillation: I48.19

## 2019-04-11 HISTORY — DX: Venous insufficiency (chronic) (peripheral): I87.2

## 2019-04-11 LAB — CBC
HCT: 40.6 % (ref 39.0–52.0)
Hemoglobin: 12.3 g/dL — ABNORMAL LOW (ref 13.0–17.0)
MCH: 29.2 pg (ref 26.0–34.0)
MCHC: 30.3 g/dL (ref 30.0–36.0)
MCV: 96.4 fL (ref 80.0–100.0)
Platelets: 134 10*3/uL — ABNORMAL LOW (ref 150–400)
RBC: 4.21 MIL/uL — ABNORMAL LOW (ref 4.22–5.81)
RDW: 15.9 % — ABNORMAL HIGH (ref 11.5–15.5)
WBC: 7.1 10*3/uL (ref 4.0–10.5)
nRBC: 0 % (ref 0.0–0.2)

## 2019-04-11 LAB — BASIC METABOLIC PANEL
Anion gap: 11 (ref 5–15)
BUN: 30 mg/dL — ABNORMAL HIGH (ref 8–23)
CO2: 32 mmol/L (ref 22–32)
Calcium: 9.1 mg/dL (ref 8.9–10.3)
Chloride: 98 mmol/L (ref 98–111)
Creatinine, Ser: 1 mg/dL (ref 0.61–1.24)
GFR calc Af Amer: >60 mL/min (ref >60)
GFR calc non Af Amer: >60 mL/min (ref >60)
Glucose, Bld: 98 mg/dL (ref 70–99)
Potassium: 4.7 mmol/L (ref 3.5–5.1)
Sodium: 141 mmol/L (ref 135–145)

## 2019-04-11 NOTE — Progress Notes (Signed)
Anesthesia Chart Review   Case: [id number] Date/Time: 04/12/19 1115   Procedure: BIOPSY TRANSRECTAL ULTRASONIC PROSTATE (TUBP) CYSTOSCOPY (N/A )   Anesthesia type: General   Pre-op diagnosis: ELEVATED PROSTATE SPECIFIC ANTIGEN HEMATURIA   Location: [facility] ROOM 07 / [facility]   Surgeon: [name] Hughs, MD      DISCUSSION:78 y.o. former smoker (quit 09/20/06) with h/o OSA, HTN, peripheral neuropathy, RBBB, thrombocytopenia, s/p AVR 2016, pacemaker for heart block with last device check 02/06/2019, A-fib (on Eliquis), elevated PSA, hematuria scheduled for above procedure 04/12/2019 with Dr. Louis Meckel.   Pt cleared by cardiology.  Per Cecilie Kicks, NP, "[name] was last seen on 02/07/19 by Dr. [name] for hx of TAVR, Pacemaker for heart block and atrial fib, MV stenosis, also OSA with CPAP and COPD.  Since that day, [name] has done well with no change in physical activity. No chest pain or Sob.  Therefore, based on ACC/AHA guidelines, the patient would be at acceptable risk for the planned procedure without further cardiovascular testing.  He may hold eliquis 2 days prior to biopsy and resume post op when safe."  Anticipate pt can proceed with planned procedure barring acute status change.   VS: BP 132/74   Pulse 65   Temp 37.2 C (Oral)   Resp 18   Wt (!) 154.4 kg   SpO2 95%   BMI 43.72 kg/m   PROVIDERS: [name], MD is PCP  Jenkins Rouge, MD is Cardiologist  LABS: Labs reviewed: Acceptable for surgery. (all labs ordered are listed, but only abnormal results are displayed)  Labs Reviewed  CBC - Abnormal; Notable for the following components:      Result Value   RBC 4.21 (*)    Hemoglobin 12.3 (*)    RDW 15.9 (*)    Platelets 134 (*)    All other components within normal limits  BASIC METABOLIC PANEL - Abnormal; Notable for the following components:   BUN 30 (*)    All other components within normal limits     IMAGES: Chest Xray 10/27/2018 FINDINGS: Stable  enlarged cardiac silhouette and left subclavian pacemaker leads. The lungs remain clear with normal vascularity. Right shoulder degenerative changes and thoracic spine degenerative changes.  IMPRESSION: No acute abnormality. Stable cardiomegaly.  EKG: 11/09/2018 Rate 65 bpm Sinus rhythm with 1st degree A-V block Right bundle branch block T wave abnormality, consider inferior ischemia Abnormal ECG  CV: Echo 02/07/2019 IMPRESSIONS    1. The left ventricle has hyperdynamic systolic function, with an ejection fraction of >65%. The cavity size was normal. There is mildly increased left ventricular wall thickness. Left ventricular diastolic Doppler parameters are indeterminate.  2. The right ventricle has normal systolic function. The cavity was normal. There is no increase in right ventricular wall thickness.  3. Left atrial size was mildly dilated.  4. The mitral valve is degenerative. Severe thickening of the mitral valve leaflet. Severe calcification of the mitral valve leaflet. There is severe mitral annular calcification present. Moderate mitral valve stenosis.  5. - TAVR: Post TAVR with stable gradients and no PvL. Past Medical History:  Diagnosis Date  . 1st degree AV block 11/09/2018   Noted on EKG   . Anemia   . Aortic stenosis, severe    S/p Edwards Sapien 3 Transcatheter Heart Valve (size 26 mm, model # [id number], serial # [id number])  . Arthritis   . Back pain   . Bursitis   . Cataract  left immature  . Cellulitis 10/12/2015  . Complication of anesthesia    Halucinations  . Constipation    takes Miralax daily as well as Senokot daily  . DDD (degenerative disc disease)   . Gout    takes Allopurinol daily  . Heart valve disorder   . History of blood clots 1962   knee  . History of blood transfusion    no abnormal reaction noted  . History of shingles   . Hyperlipidemia    takes Atorvastatin daily  . Hypertension    takes Lisinopril daily  . Joint pain   .  Low back pain 01/25/2017  . Morbid obesity ([place])   . Neck pain on left side 01/25/2017  . OSA (obstructive sleep apnea)   . Osteoarthritis   . Peripheral edema    takes Furosemide daily  . Peripheral neuropathy    takes Gabapentin daily  . Peroneal palsy    significant right foot drop  . Persistent atrial fibrillation   . Pneumonia 25+yrs ago   hx of  . RBBB 11/09/2018   Noted on EKG  . Shortness of breath   . Swelling of extremity   . Thrombocytopenia ([place])   . Urinary frequency   . Urinary urgency   . Valvular heart disease   . Venous stasis dermatitis     Past Surgical History:  Procedure Laterality Date  . CARDIAC CATHETERIZATION N/A 05/02/2015   Procedure: Right/Left Heart Cath and Coronary Angiography;  Surgeon: Burnell Blanks, MD;  Location: [place] [facility];  Service: Cardiovascular;  Laterality: N/A;  . CARDIOVERSION N/A 10/27/2018   Procedure: CARDIOVERSION;  Surgeon: Fay Records, MD;  Location: [facility] ENDOSCOPY;  Service: Cardiovascular;  Laterality: N/A;  . COLONOSCOPY N/A 02/07/2013   Procedure: COLONOSCOPY;  Surgeon: Juanita Craver, MD;  Location: [facility] ENDOSCOPY;  Service: Endoscopy;  Laterality: N/A;  . COLONOSCOPY N/A 02/09/2013   Procedure: COLONOSCOPY;  Surgeon: Beryle Beams, MD;  Location: [place];  Service: Endoscopy;  Laterality: N/A;  . ESOPHAGOGASTRODUODENOSCOPY N/A 02/07/2013   Procedure: ESOPHAGOGASTRODUODENOSCOPY (EGD);  Surgeon: Juanita Craver, MD;  Location: [facility] ENDOSCOPY;  Service: Endoscopy;  Laterality: N/A;  . GIVENS CAPSULE STUDY N/A 02/09/2013   Procedure: GIVENS CAPSULE STUDY;  Surgeon: Beryle Beams, MD;  Location: [place];  Service: Endoscopy;  Laterality: N/A;  . HERNIA REPAIR     umbilical hernia  . KNEE SURGERY Right    multiple knee surgeries due to complication of R TKA  . LAMINOTOMY  1610   c6-t2  . PACEMAKER IMPLANT N/A 04/27/2018   Procedure: PACEMAKER IMPLANT;  Surgeon: Constance Haw, MD;  Location: [place] [facility];   Service: Cardiovascular;  Laterality: N/A;  . SPINE SURGERY    . TEE WITHOUT CARDIOVERSION N/A 03/07/2015   Procedure: TRANSESOPHAGEAL ECHOCARDIOGRAM (TEE);  Surgeon: Josue Hector, MD;  Location: [facility] ENDOSCOPY;  Service: Cardiovascular;  Laterality: N/A;  ANES TO BRING PROPOFOL PER DOCTOR  . TEE WITHOUT CARDIOVERSION N/A 06/10/2015   Procedure: TRANSESOPHAGEAL ECHOCARDIOGRAM (TEE);  Surgeon: Burnell Blanks, MD;  Location: [place];  Service: Open Heart Surgery;  Laterality: N/A;  . TOTAL KNEE ARTHROPLASTY     right  . TRANSCATHETER AORTIC VALVE REPLACEMENT, TRANSFEMORAL Left 06/10/2015   Procedure: TRANSCATHETER AORTIC VALVE REPLACEMENT, TRANSFEMORAL;  Surgeon: Burnell Blanks, MD;  Location: [place];  Service: Open Heart Surgery;  Laterality: Left;    MEDICATIONS: . acetaminophen (TYLENOL) 500 MG tablet  . aspirin 81 MG EC tablet  .  atorvastatin (LIPITOR) 40 MG tablet  . Cholecalciferol (VITAMIN D3) 125 MCG (5000 UT) CAPS  . Coenzyme Q10 (COQ10) 100 MG CAPS  . CRANBERRY PO  . ELIQUIS 5 MG TABS tablet  . Ferrous Fumarate-Folic Acid (HEMATINIC/FOLIC ACID) 709-6 MG TABS  . furosemide (LASIX) 20 MG tablet  . gabapentin (NEURONTIN) 300 MG capsule  . Menthol, Topical Analgesic, (BENGAY EX)  . Misc Natural Products (TART CHERRY ADVANCED PO)  . Multiple Vitamins-Minerals (CENTRUM SILVER PO)  . polyethylene glycol (MIRALAX / GLYCOLAX) packet  . sennosides-docusate sodium (SENOKOT-S) 8.6-50 MG tablet  . vitamin C (ASCORBIC ACID) 500 MG tablet   No current facility-administered medications for this encounter.     Maia Plan [facility] Pre-Surgical Testing [phone] 04/11/19  3:30 PM

## 2019-04-11 NOTE — Anesthesia Preprocedure Evaluation (Addendum)
Anesthesia Evaluation  Patient identified by MRN, date of birth, ID band Patient awake    Reviewed: Allergy & Precautions, NPO status , Patient's Chart, lab work & pertinent test results  History of Anesthesia Complications Negative for: history of anesthetic complications  Airway Mallampati: III  TM Distance: >3 FB Neck ROM: Full    Dental  (+) Dental Advisory Given   Pulmonary shortness of breath, sleep apnea, Continuous Positive Airway Pressure Ventilation and Oxygen sleep apnea , COPD, former smoker,    breath sounds clear to auscultation       Cardiovascular hypertension, Pt. on medications + Peripheral Vascular Disease and +CHF  + dysrhythmias Atrial Fibrillation + pacemaker + Valvular Problems/Murmurs  Rhythm:Regular     Neuro/Psych neg Seizures Uses walker  Neuromuscular disease    GI/Hepatic negative GI ROS,   Endo/Other  Morbid obesity  Renal/GU Renal disease     Musculoskeletal  (+) Arthritis ,   Abdominal   Peds  Hematology  (+) anemia , eliquis held two days   Anesthesia Other Findings S/p TAVR 4-5 years ago, nl ef, MS with gradient 8  St jude pacemaker for heart block and afib  Reproductive/Obstetrics                            Anesthesia Physical Anesthesia Plan  ASA: III  Anesthesia Plan: General   Post-op Pain Management:    Induction: Intravenous  PONV Risk Score and Plan: 2 and Ondansetron and Dexamethasone  Airway Management Planned: Oral ETT  Additional Equipment: None  Intra-op Plan:   Post-operative Plan: Extubation in OR  Informed Consent: I have reviewed the patients History and Physical, chart, labs and discussed the procedure including the risks, benefits and alternatives for the proposed anesthesia with the patient or authorized representative who has indicated his/her understanding and acceptance.       Plan Discussed with: CRNA and  Surgeon  Anesthesia Plan Comments: (See PAT note 04/12/2019, Konrad Felix, PA-C)       Anesthesia Quick Evaluation

## 2019-04-12 ENCOUNTER — Ambulatory Visit (HOSPITAL_COMMUNITY)
Admission: RE | Admit: 2019-04-12 | Discharge: 2019-04-12 | Disposition: A | Discharge status: Home / Self Care | Payer: PPO | Source: Other Acute Inpatient Hospital | Attending: Urology | Admitting: Urology

## 2019-04-12 ENCOUNTER — Ambulatory Visit (HOSPITAL_COMMUNITY): Payer: PPO | Admitting: Certified Registered"

## 2019-04-12 ENCOUNTER — Encounter (HOSPITAL_COMMUNITY)
Admission: RE | Disposition: A | Discharge status: Home / Self Care | Payer: Self-pay | Source: Other Acute Inpatient Hospital | Attending: Urology

## 2019-04-12 ENCOUNTER — Ambulatory Visit (HOSPITAL_COMMUNITY)
Admission: RE | Admit: 2019-04-12 | Discharge: 2019-04-12 | Disposition: A | Discharge status: Home / Self Care | Payer: PPO | Source: Ambulatory Visit | Attending: Urology | Admitting: Urology

## 2019-04-12 ENCOUNTER — Encounter (HOSPITAL_COMMUNITY): Payer: Self-pay

## 2019-04-12 ENCOUNTER — Ambulatory Visit (HOSPITAL_COMMUNITY): Payer: PPO | Admitting: Physician Assistant

## 2019-04-12 DIAGNOSIS — Z87891 Personal history of nicotine dependence: Secondary | ICD-10-CM | POA: Insufficient documentation

## 2019-04-12 DIAGNOSIS — Z952 Presence of prosthetic heart valve: Secondary | ICD-10-CM | POA: Insufficient documentation

## 2019-04-12 DIAGNOSIS — M109 Gout, unspecified: Secondary | ICD-10-CM | POA: Insufficient documentation

## 2019-04-12 DIAGNOSIS — G629 Polyneuropathy, unspecified: Secondary | ICD-10-CM | POA: Diagnosis not present

## 2019-04-12 DIAGNOSIS — C61 Malignant neoplasm of prostate: Secondary | ICD-10-CM | POA: Insufficient documentation

## 2019-04-12 DIAGNOSIS — G4733 Obstructive sleep apnea (adult) (pediatric): Secondary | ICD-10-CM | POA: Diagnosis not present

## 2019-04-12 DIAGNOSIS — Z7982 Long term (current) use of aspirin: Secondary | ICD-10-CM | POA: Insufficient documentation

## 2019-04-12 DIAGNOSIS — R310 Gross hematuria: Secondary | ICD-10-CM | POA: Insufficient documentation

## 2019-04-12 DIAGNOSIS — J449 Chronic obstructive pulmonary disease, unspecified: Secondary | ICD-10-CM | POA: Insufficient documentation

## 2019-04-12 DIAGNOSIS — R972 Elevated prostate specific antigen [PSA]: Secondary | ICD-10-CM

## 2019-04-12 DIAGNOSIS — Z7901 Long term (current) use of anticoagulants: Secondary | ICD-10-CM | POA: Insufficient documentation

## 2019-04-12 DIAGNOSIS — I110 Hypertensive heart disease with heart failure: Secondary | ICD-10-CM | POA: Diagnosis not present

## 2019-04-12 DIAGNOSIS — I4819 Other persistent atrial fibrillation: Secondary | ICD-10-CM | POA: Insufficient documentation

## 2019-04-12 DIAGNOSIS — I509 Heart failure, unspecified: Secondary | ICD-10-CM | POA: Diagnosis not present

## 2019-04-12 DIAGNOSIS — Z8042 Family history of malignant neoplasm of prostate: Secondary | ICD-10-CM | POA: Insufficient documentation

## 2019-04-12 DIAGNOSIS — I739 Peripheral vascular disease, unspecified: Secondary | ICD-10-CM | POA: Insufficient documentation

## 2019-04-12 DIAGNOSIS — K5900 Constipation, unspecified: Secondary | ICD-10-CM | POA: Diagnosis not present

## 2019-04-12 DIAGNOSIS — Z950 Presence of cardiac pacemaker: Secondary | ICD-10-CM | POA: Insufficient documentation

## 2019-04-12 DIAGNOSIS — G4730 Sleep apnea, unspecified: Secondary | ICD-10-CM | POA: Insufficient documentation

## 2019-04-12 DIAGNOSIS — Z96651 Presence of right artificial knee joint: Secondary | ICD-10-CM | POA: Insufficient documentation

## 2019-04-12 DIAGNOSIS — Z79899 Other long term (current) drug therapy: Secondary | ICD-10-CM | POA: Insufficient documentation

## 2019-04-12 DIAGNOSIS — R600 Localized edema: Secondary | ICD-10-CM | POA: Diagnosis not present

## 2019-04-12 DIAGNOSIS — I440 Atrioventricular block, first degree: Secondary | ICD-10-CM | POA: Insufficient documentation

## 2019-04-12 DIAGNOSIS — E785 Hyperlipidemia, unspecified: Secondary | ICD-10-CM | POA: Diagnosis not present

## 2019-04-12 DIAGNOSIS — D649 Anemia, unspecified: Secondary | ICD-10-CM | POA: Insufficient documentation

## 2019-04-12 DIAGNOSIS — Z6841 Body Mass Index (BMI) 40.0 and over, adult: Secondary | ICD-10-CM | POA: Insufficient documentation

## 2019-04-12 HISTORY — PX: PROSTATE BIOPSY: SHX241

## 2019-04-12 SURGERY — BIOPSY, PROSTATE, RECTAL APPROACH, WITH US GUIDANCE
Anesthesia: General

## 2019-04-12 MED ORDER — SODIUM CHLORIDE 0.9 % IV SOLN
INTRAVENOUS | Status: DC | PRN
  Administered 2019-04-12: 40 ug/min via INTRAVENOUS

## 2019-04-12 MED ORDER — LIDOCAINE 2% (20 MG/ML) 5 ML SYRINGE
INTRAMUSCULAR | Status: DC | PRN
  Administered 2019-04-12: 60 mg via INTRAVENOUS

## 2019-04-12 MED ORDER — OXYCODONE HCL 5 MG PO TABS: 5.0000 mg | ORAL_TABLET | Freq: Once | ORAL | Status: DC | PRN

## 2019-04-12 MED ORDER — ACETAMINOPHEN 500 MG PO TABS: 1000.0000 mg | ORAL_TABLET | Freq: Once | ORAL | Status: DC | PRN

## 2019-04-12 MED ORDER — FENTANYL CITRATE (PF) 100 MCG/2ML IJ SOLN: 25.0000 ug | INTRAMUSCULAR | Status: DC | PRN

## 2019-04-12 MED ORDER — OXYCODONE HCL 5 MG/5ML PO SOLN: 5.0000 mg | Freq: Once | ORAL | Status: DC | PRN

## 2019-04-12 MED ORDER — ACETAMINOPHEN 160 MG/5ML PO SOLN: 1000.0000 mg | Freq: Once | ORAL | Status: DC | PRN

## 2019-04-12 MED ORDER — FENTANYL CITRATE (PF) 250 MCG/5ML IJ SOLN
INTRAMUSCULAR | Status: DC | PRN
  Administered 2019-04-12 (×2): 50 ug via INTRAVENOUS

## 2019-04-12 MED ORDER — PROPOFOL 10 MG/ML IV BOLUS
INTRAVENOUS | Status: DC | PRN
  Administered 2019-04-12: 110 mg via INTRAVENOUS

## 2019-04-12 MED ORDER — DEXAMETHASONE SODIUM PHOSPHATE 10 MG/ML IJ SOLN
INTRAMUSCULAR | Status: DC | PRN
  Administered 2019-04-12: 4 mg via INTRAVENOUS

## 2019-04-12 MED ORDER — FENTANYL CITRATE (PF) 100 MCG/2ML IJ SOLN
INTRAMUSCULAR | Status: AC
  Filled 2019-04-12: qty 2

## 2019-04-12 MED ORDER — SODIUM CHLORIDE 0.9 % IR SOLN
Status: DC | PRN
  Administered 2019-04-12: 1000 mL

## 2019-04-12 MED ORDER — LIDOCAINE 2% (20 MG/ML) 5 ML SYRINGE
INTRAMUSCULAR | Status: AC
  Filled 2019-04-12: qty 5

## 2019-04-12 MED ORDER — APIXABAN 5 MG PO TABS: 5.0000 mg | ORAL_TABLET | Freq: Two times a day (BID) | ORAL | Status: DC

## 2019-04-12 MED ORDER — ROCURONIUM BROMIDE 10 MG/ML (PF) SYRINGE
PREFILLED_SYRINGE | INTRAVENOUS | Status: AC
  Filled 2019-04-12: qty 10

## 2019-04-12 MED ORDER — EPHEDRINE SULFATE-NACL 50-0.9 MG/10ML-% IV SOSY
PREFILLED_SYRINGE | INTRAVENOUS | Status: DC | PRN
  Administered 2019-04-12: 10 mg via INTRAVENOUS

## 2019-04-12 MED ORDER — LIDOCAINE HCL URETHRAL/MUCOSAL 2 % EX GEL
CUTANEOUS | Status: AC
  Filled 2019-04-12: qty 5

## 2019-04-12 MED ORDER — DEXAMETHASONE SODIUM PHOSPHATE 10 MG/ML IJ SOLN
INTRAMUSCULAR | Status: AC
  Filled 2019-04-12: qty 1

## 2019-04-12 MED ORDER — ACETAMINOPHEN 10 MG/ML IV SOLN: 1000.0000 mg | Freq: Once | INTRAVENOUS | Status: DC | PRN

## 2019-04-12 MED ORDER — PROPOFOL 10 MG/ML IV BOLUS
INTRAVENOUS | Status: AC
  Filled 2019-04-12: qty 40

## 2019-04-12 MED ORDER — BELLADONNA ALKALOIDS-OPIUM 16.2-30 MG RE SUPP
RECTAL | Status: AC
  Filled 2019-04-12: qty 1

## 2019-04-12 MED ORDER — SODIUM CHLORIDE 0.9 % IV SOLN
2.0000 g | INTRAVENOUS | Status: AC
  Administered 2019-04-12: 2 g via INTRAVENOUS
  Filled 2019-04-12: qty 20

## 2019-04-12 MED ORDER — ONDANSETRON HCL 4 MG/2ML IJ SOLN
INTRAMUSCULAR | Status: AC
  Filled 2019-04-12: qty 2

## 2019-04-12 MED ORDER — ONDANSETRON HCL 4 MG/2ML IJ SOLN
INTRAMUSCULAR | Status: DC | PRN
  Administered 2019-04-12: 4 mg via INTRAVENOUS

## 2019-04-12 MED ORDER — PHENYLEPHRINE 40 MCG/ML (10ML) SYRINGE FOR IV PUSH (FOR BLOOD PRESSURE SUPPORT)
PREFILLED_SYRINGE | INTRAVENOUS | Status: DC | PRN
  Administered 2019-04-12 (×3): 80 ug via INTRAVENOUS
  Administered 2019-04-12 (×2): 40 ug via INTRAVENOUS
  Administered 2019-04-12: 80 ug via INTRAVENOUS

## 2019-04-12 MED ORDER — SUCCINYLCHOLINE CHLORIDE 200 MG/10ML IV SOSY
PREFILLED_SYRINGE | INTRAVENOUS | Status: DC | PRN
  Administered 2019-04-12: 120 mg via INTRAVENOUS

## 2019-04-12 MED ORDER — LIDOCAINE HCL 2 % IJ SOLN
INTRAMUSCULAR | Status: AC
  Filled 2019-04-12: qty 20

## 2019-04-12 MED ORDER — LACTATED RINGERS IV SOLN
INTRAVENOUS | Status: DC
  Administered 2019-04-12: 10:00:00 via INTRAVENOUS

## 2019-04-12 SURGICAL SUPPLY — 8 items
COVER SURGICAL LIGHT HANDLE (MISCELLANEOUS) ×2 IMPLANT
INST BIOPSY MAXCORE 18GX25 (NEEDLE) IMPLANT
INSTR BIOPSY MAXCORE 18GX20 (NEEDLE) ×1 IMPLANT
KIT TURNOVER KIT A (KITS) IMPLANT
NDL SPNL 22GX7 QUINCKE BK (NEEDLE) ×1 IMPLANT
NEEDLE SPNL 22GX7 QUINCKE BK (NEEDLE) ×2 IMPLANT
SYR CONTROL 10ML LL (SYRINGE) IMPLANT
UNDERPAD 30X30 (UNDERPADS AND DIAPERS) ×2 IMPLANT

## 2019-04-12 NOTE — Op Note (Signed)
Preoperative diagnosis:  1. Gross hematuria 2. Elevated PSA  Postoperative diagnosis:  1. Same  Procedure: 1. Cystoscopy 2. Transrectal ultrasound 3. Transrectal ultrasound-guided prostate biopsy  Surgeon: [name] Hughs, MD  Anesthesia: General  Complications: None  Intraoperative findings:  #1: The patient's prostate measured approximately 51 g, there was some hypoechoic areas within the left lateral base. #2: The patient's cystoscopy was completely normal with no evidence of any bladder abnormalities or etiology of his hematuria.  EBL: Minimal  Specimens: None  Indication: [name] is a 78 y.o. patient with history of an elevated PSA and gross hematuria.  After reviewing the management options for treatment, he elected to proceed with the above surgical procedure(s). We have discussed the potential benefits and risks of the procedure, side effects of the proposed treatment, the likelihood of the patient achieving the goals of the procedure, and any potential problems that might occur during the procedure or recuperation. Informed consent has been obtained.  Description of procedure:  The patient was taken to the operating room and general anesthesia was induced.  A preoperative time-out was performed.  The patient was left on the gurney and prepped and draped in the routine sterile fashion.  I then passed a 16 French flexible cystoscope into the patient's urethra and into the bladder under visual guidance.  The above findings were noted.  The patient's prostate was relatively nonobstructive and his bladder mucosa was essentially normal.  I then removed the cystoscope and used a 16 French red rubber catheter to drain his bladder.  The patient was then placed in the lithotomy position on the gurney.  I then passed a transrectal ultrasound probe into the patient's rectum and performed a prostate ultrasound with the above findings.  I then performed a prostate biopsy in the  routine fashion.  Was a 12 core biopsy.  I obtain biopsy samples at the patient's lateral base and medial base, lateral mid and medial mid, lateral apex and medial apex on both sides.  The probe was subsequently removed.  He was subsequently flipped back into the supine position prior to being extubated.  He tolerated this procedure well.  There were no complications.  [name] Hughs, M.D.

## 2019-04-12 NOTE — Anesthesia Procedure Notes (Signed)
Procedure Name: Intubation Date/Time: 04/12/2019 12:01 PM Performed by: Eben Burow, CRNA Pre-anesthesia Checklist: Patient identified, Emergency Drugs available, Suction available, Patient being monitored and Timeout performed Patient Re-evaluated:Patient Re-evaluated prior to induction Oxygen Delivery Method: Circle system utilized Preoxygenation: Pre-oxygenation with 100% oxygen Induction Type: IV induction and Rapid sequence Laryngoscope Size: Mac and 4 Grade View: Grade I Tube type: Oral Tube size: 7.5 mm Number of attempts: 1 Airway Equipment and Method: Stylet Placement Confirmation: ETT inserted through vocal cords under direct vision,  positive ETCO2 and breath sounds checked- equal and bilateral Secured at: 23 cm Tube secured with: Tape Dental Injury: Teeth and Oropharynx as per pre-operative assessment

## 2019-04-12 NOTE — Discharge Instructions (Signed)
You can expect blood in your urine/stool for at least 48 hours and it can last for up to 2 weeks. Resume Eliquis once the bleeding as stopped. Call [facility] once the bleeding has stopped.

## 2019-04-12 NOTE — Transfer of Care (Signed)
Immediate Anesthesia Transfer of Care Note  Patient: [name]  Procedure(s) Performed: BIOPSY TRANSRECTAL ULTRASONIC PROSTATE (TUBP) CYSTOSCOPY (N/A )  Patient Location: PACU  Anesthesia Type:General  Level of Consciousness: awake, alert  and oriented  Airway & Oxygen Therapy: Patient Spontanous Breathing and Patient connected to face mask oxygen  Post-op Assessment: Report given to RN and Post -op Vital signs reviewed and stable  Post vital signs: Reviewed and stable  Last Vitals:  Vitals Value Taken Time  BP 144/83 04/12/19 1248  Temp    Pulse 67 04/12/19 1250  Resp 11 04/12/19 1250  SpO2 100 % 04/12/19 1250  Vitals shown include unvalidated device data.  Last Pain:  Vitals:   04/12/19 1000  TempSrc:   PainSc: 0-No pain         Complications: No apparent anesthesia complications

## 2019-04-12 NOTE — H&P (Signed)
Elevated PSA  HPI: [name] is a 78 year-old male patient who was referred by Dr. Riki Sheer, MD who is here further eval and management of an elevated PSA.  The patient's most recent PSA was 24.63. This was drawn on approximately 03/15/2019. Past PSAs: 3/20: 22.91.   The patient denies any progression of his voiding symptoms. The patient denies any new bone pain, new back pain, or lower extremity edema. He does not have a history of prostatitis.   He has not had a prostate biopsy done. The patient states he does not take 5 alpha reductase inhibitor medication.   Patient does have a family history of prostate cancer. Family members with diagnosed prostate cancer include his brother.   Patient was treated for Proteus hemorrhagic UTI in March 2020.   The patient takes Eliquis as a history of aortic stenosis and is status post TAVR. Patient also had a pacemaker implanted in August of 2019.     ALLERGIES: Coumadin    MEDICATIONS: Acetaminophen 500 mg tablet  Ammonium Lactate 12 % lotion  Atorvastatin Calcium 40 mg tablet  Black Elderberry  Co Q10  Eliquis 5 mg tablet  Eucerin 0.1 % lotion  Ferrous Fumarate 324 mg (106 mg iron) tablet  Gabapentin 300 mg capsule  Hydrocortisone 2.5 % cream  Miralax 17 gram powder in packet  Multiple Vitamin  Senokot 8.6 mg tablet  Tart Cherry  Vitamin C 500 mg capsule  Vitamin D     GU PSH: None     PSH Notes: S/P Edwards Sapien 3 Transcatheter Heart Valve (size 26 mm, model # [id number], serial # [id number]), right/left heart cath and coronary angiography (2016) , cardioversion (10/2018), colonoscopy (2014), esophagogastroduodenoscopy (2014), Pacemaker implant (04/2018), Spine surgery, TEE without Cardioversion- Transesophageal echocardiogram (02/2015), Transcatheter aortic valve replacement (left)- 05/2015   NON-GU PSH: Hernia Repair, Umbilical Knee Arthroscopy, Right Laminotomy; Add?l Cervical, c6-t2     GU PMH: None     PMH Notes:  Aortic Stenosis, severe, Degenerative disc disease, History of blood clots (knee), heart valve disorder, History of blood transfusion- no abnormal reaction noted, h/o shingles, peripheral edema, peripheral neuropathy, peroneal palsy (right food drop)   NON-GU PMH: Anemia, unspecified Arthritis Gout Hypertension Osteoarthritis Sleep Apnea    FAMILY HISTORY: 3 Son's - Son Prostate Cancer - Brother   SOCIAL HISTORY: Marital Status: Married Preferred Language: English; Ethnicity: Not Hispanic Or Latino; Race: White Current Smoking Status: Patient does not smoke anymore. Has not smoked since 03/21/2007. Smoked for 30 years.   Tobacco Use Assessment Completed: Used Tobacco in last 30 days? Has never drank.  Drinks 3 caffeinated drinks per day. Patient's occupation is/was Retired.    REVIEW OF SYSTEMS:    GU Review Male:   Patient reports get up at night to urinate and burning/ pain with urination. Patient denies leakage of urine, have to strain to urinate , stream starts and stops, erection problems, penile pain, trouble starting your stream, frequent urination, and hard to postpone urination.  Gastrointestinal (Upper):   Patient denies nausea, vomiting, and indigestion/ heartburn.  Gastrointestinal (Lower):   Patient denies diarrhea and constipation.  Constitutional:   Patient denies fever, night sweats, weight loss, and fatigue.  Skin:   Patient denies skin rash/ lesion and itching.  Eyes:   Patient denies blurred vision and double vision.  Ears/ Nose/ Throat:   Patient denies sore throat and sinus problems.  Hematologic/Lymphatic:   Patient reports easy bruising. Patient denies swollen glands.  Cardiovascular:  Patient reports leg swelling. Patient denies chest pains.  Respiratory:   Patient denies cough and shortness of breath.  Endocrine:   Patient denies excessive thirst.  Musculoskeletal:   Patient reports back pain and joint pain.   Neurological:   Patient denies headaches and  dizziness.  Psychologic:   Patient denies depression and anxiety.   VITAL SIGNS:      03/29/2019 01:46 PM  BP 135/75 mmHg  Pulse 64 /min  Temperature 98.4 F / 36.8 C   MULTI-SYSTEM PHYSICAL EXAMINATION:    Constitutional: Well-nourished. No physical deformities. Normally developed. Good grooming. Morbidly obese  Neck: Neck symmetrical, not swollen. Normal tracheal position.  Respiratory: No labored breathing, no use of accessory muscles.   Cardiovascular: Normal temperature, normal extremity pulses, no swelling, no varicosities.  Lymphatic: No enlargement of neck, axillae, groin.  Skin: No paleness, no jaundice, no cyanosis. No lesion, no ulcer, no rash.  Neurologic / Psychiatric: Oriented to time, oriented to place, oriented to person. No depression, no anxiety, no agitation.  Gastrointestinal: No mass, no tenderness, no rigidity, non obese abdomen.  Eyes: Normal conjunctivae. Normal eyelids.  Ears, Nose, Mouth, and Throat: Left ear no scars, no lesions, no masses. Right ear no scars, no lesions, no masses. Nose no scars, no lesions, no masses. Normal hearing. Normal lips.  Musculoskeletal: Normal gait and station of head and neck.     PAST DATA REVIEWED:  Source Of History:  Patient  Lab Test Review:   PSA  Records Review:   Previous Doctor Records, Previous Patient Records, POC Tool   PROCEDURES:          Urinalysis w/Scope Dipstick Dipstick Cont'd Micro  Color: Yellow Bilirubin: Neg mg/dL WBC/hpf: 0 - 5/hpf  Appearance: Clear Ketones: Neg mg/dL RBC/hpf: 0 - 2/hpf  Specific Gravity: 1.010 Blood: 1+ ery/uL Bacteria: NS (Not Seen)  pH: <=5.0 Protein: Neg mg/dL Cystals: NS (Not Seen)  Glucose: Neg mg/dL Urobilinogen: 0.2 mg/dL Casts: NS (Not Seen)    Nitrites: Neg Trichomonas: Not Present    Leukocyte Esterase: Neg leu/uL Mucous: Not Present      Epithelial Cells: NS (Not Seen)      Yeast: NS (Not Seen)      Sperm: Not Present    ASSESSMENT:      ICD-10 Details  1 GU:    Elevated PSA - R97.20   2 NON-GU:   Obesity - E66.01    PLAN:           Document Letter(s):  Created for Patient: Clinical Summary         Notes:   The patient's PSA went up after being treated for the Proteus UTI. Obviously this is the wrong direction and concerning. The patient has a brother who died from prostate cancer, to the best of his knowledge. Given his risk factors, and his rising PSA, I recommended that we perform a prostate biopsy. Given his body habitus, I think it is best done in the operating room under anesthesia. At the time of the prostate biopsy, will also perform cystoscopy given his history of gross hematuria. The patient will need to stop his Eliquis prior to the biopsy, and will reach out to his cardiologist for this.

## 2019-04-13 ENCOUNTER — Encounter (HOSPITAL_COMMUNITY): Payer: Self-pay | Admitting: Urology

## 2019-04-16 NOTE — Anesthesia Postprocedure Evaluation (Signed)
Anesthesia Post Note  Patient: [name]  Procedure(s) Performed: BIOPSY TRANSRECTAL ULTRASONIC PROSTATE (TUBP) CYSTOSCOPY (N/A )     Patient location during evaluation: PACU Anesthesia Type: General Level of consciousness: awake and alert Pain management: pain level controlled Vital Signs Assessment: post-procedure vital signs reviewed and stable Respiratory status: spontaneous breathing, nonlabored ventilation, respiratory function stable and patient connected to nasal cannula oxygen Cardiovascular status: blood pressure returned to baseline and stable Postop Assessment: no apparent nausea or vomiting Anesthetic complications: no    Last Vitals:  Vitals:   04/12/19 1315 04/12/19 1330  BP: 113/62 134/79  Pulse: 75 73  Resp: 14 17  Temp:  (!) 36 C  SpO2: 98% 100%    Last Pain:  Vitals:   04/12/19 1330  TempSrc:   PainSc: 0-No pain                 Andree Heeg

## 2019-04-17 DIAGNOSIS — G4733 Obstructive sleep apnea (adult) (pediatric): Secondary | ICD-10-CM | POA: Diagnosis not present

## 2019-04-17 DIAGNOSIS — R062 Wheezing: Secondary | ICD-10-CM | POA: Diagnosis not present

## 2019-04-22 DIAGNOSIS — G4733 Obstructive sleep apnea (adult) (pediatric): Secondary | ICD-10-CM | POA: Diagnosis not present

## 2019-04-22 DIAGNOSIS — J9622 Acute and chronic respiratory failure with hypercapnia: Secondary | ICD-10-CM | POA: Diagnosis not present

## 2019-04-22 DIAGNOSIS — R062 Wheezing: Secondary | ICD-10-CM | POA: Diagnosis not present

## 2019-04-22 DIAGNOSIS — J449 Chronic obstructive pulmonary disease, unspecified: Secondary | ICD-10-CM | POA: Diagnosis not present

## 2019-05-01 DIAGNOSIS — C61 Malignant neoplasm of prostate: Secondary | ICD-10-CM | POA: Diagnosis not present

## 2019-05-02 ENCOUNTER — Other Ambulatory Visit (HOSPITAL_COMMUNITY): Payer: Self-pay | Admitting: Urology

## 2019-05-02 DIAGNOSIS — C61 Malignant neoplasm of prostate: Secondary | ICD-10-CM

## 2019-05-07 ENCOUNTER — Ambulatory Visit: Payer: PPO | Admitting: Family Medicine

## 2019-05-08 ENCOUNTER — Ambulatory Visit (INDEPENDENT_AMBULATORY_CARE_PROVIDER_SITE_OTHER): Payer: PPO | Admitting: *Deleted

## 2019-05-08 DIAGNOSIS — I441 Atrioventricular block, second degree: Secondary | ICD-10-CM

## 2019-05-08 LAB — CUP PACEART REMOTE DEVICE CHECK
Battery Remaining Longevity: 116 mo
Battery Remaining Percentage: 95.5 %
Battery Voltage: 3.02 V
Brady Statistic AP VP Percent: 44 %
Brady Statistic AP VS Percent: 1 %
Brady Statistic AS VP Percent: 36 %
Brady Statistic AS VS Percent: 19 %
Brady Statistic RA Percent Paced: 38 %
Brady Statistic RV Percent Paced: 83 %
Date Time Interrogation Session: 20200817062429
Implantable Lead Implant Date: 20190808
Implantable Lead Implant Date: 20190808
Implantable Lead Location: 753859
Implantable Lead Location: 753860
Implantable Pulse Generator Implant Date: 20190808
Lead Channel Impedance Value: 430 Ohm
Lead Channel Impedance Value: 480 Ohm
Lead Channel Pacing Threshold Amplitude: 0.5 V
Lead Channel Pacing Threshold Amplitude: 0.625 V
Lead Channel Pacing Threshold Pulse Width: 0.5 ms
Lead Channel Pacing Threshold Pulse Width: 0.5 ms
Lead Channel Sensing Intrinsic Amplitude: 1.7 mV
Lead Channel Sensing Intrinsic Amplitude: 12 mV
Lead Channel Setting Pacing Amplitude: 0.875
Lead Channel Setting Pacing Amplitude: 1.5 V
Lead Channel Setting Pacing Pulse Width: 0.5 ms
Lead Channel Setting Sensing Sensitivity: 2.5 mV
Pulse Gen Model: 2272
Pulse Gen Serial Number: 9052893

## 2019-05-09 ENCOUNTER — Encounter (HOSPITAL_COMMUNITY)
Admission: RE | Admit: 2019-05-09 | Discharge: 2019-05-09 | Disposition: A | Discharge status: Home / Self Care | Payer: PPO | Source: Ambulatory Visit | Attending: Urology | Admitting: Urology

## 2019-05-09 ENCOUNTER — Other Ambulatory Visit: Payer: Self-pay

## 2019-05-09 DIAGNOSIS — C61 Malignant neoplasm of prostate: Secondary | ICD-10-CM | POA: Diagnosis not present

## 2019-05-09 DIAGNOSIS — Z8546 Personal history of malignant neoplasm of prostate: Secondary | ICD-10-CM | POA: Diagnosis not present

## 2019-05-09 MED ORDER — TECHNETIUM TC 99M MEDRONATE IV KIT
21.1000 | PACK | Freq: Once | INTRAVENOUS | Status: AC | PRN
  Administered 2019-05-09: 21.1 via INTRAVENOUS

## 2019-05-11 DIAGNOSIS — R062 Wheezing: Secondary | ICD-10-CM | POA: Diagnosis not present

## 2019-05-11 DIAGNOSIS — J9622 Acute and chronic respiratory failure with hypercapnia: Secondary | ICD-10-CM | POA: Diagnosis not present

## 2019-05-11 DIAGNOSIS — G4733 Obstructive sleep apnea (adult) (pediatric): Secondary | ICD-10-CM | POA: Diagnosis not present

## 2019-05-11 DIAGNOSIS — J449 Chronic obstructive pulmonary disease, unspecified: Secondary | ICD-10-CM | POA: Diagnosis not present

## 2019-05-13 ENCOUNTER — Other Ambulatory Visit (HOSPITAL_COMMUNITY): Payer: Self-pay | Admitting: Nurse Practitioner

## 2019-05-14 ENCOUNTER — Encounter: Payer: Self-pay | Admitting: Radiation Oncology

## 2019-05-14 NOTE — Progress Notes (Signed)
GU Location of Tumor / Histology: prostatic adenocarcinoma  If Prostate Cancer, Gleason Score is (4 + 4) and PSA is (24.6). 10 of 12 cores involved. Prostate volume: 51 grams.   [name] was referred by Dr. Riki Sheer (family medicine) to Dr. Louis Meckel in March 2020 after discovering a PSA of 22.91.   Biopsies of prostate (if applicable) revealed:     Past/Anticipated interventions by urology, if any: prostate biopsy, bone scan (negative), referral to Dr. Tammi Klippel to discuss radiation options  Past/Anticipated interventions by medical oncology, if any: no  Weight changes, if any: morbidly obesity impedes ambulation  Bowel/Bladder complaints, if any: IPSS 6. SHIM 1. Reports dysuria resolved but patient endorses taking AZO. Denies hematuria, urinary leakage or incontinence. Reports urinary frequency and nocturia x 1. Denies bowel complaints.  Nausea/Vomiting, if any: no  Pain issues, if any:  Yes related to effects of arthritis and back surgery. Reports pain in upper, middle and lower back and knees. Patient not a candidate for surgery per [place] thus [place] manages his pain.   SAFETY ISSUES:  Prior radiation? no  Pacemaker/ICD? yes  Possible current pregnancy? no, male patient  Is the patient on methotrexate? no  Current Complaints / other details:  78 year old male. Married with 3 sons. Brother with hx of prostate ca. Resides in [place]. Retired Administrator.

## 2019-05-15 ENCOUNTER — Encounter: Payer: Self-pay | Admitting: Radiation Oncology

## 2019-05-15 ENCOUNTER — Ambulatory Visit
Admission: RE | Admit: 2019-05-15 | Discharge: 2019-05-15 | Disposition: A | Discharge status: Home / Self Care | Payer: PPO | Source: Ambulatory Visit | Attending: Radiation Oncology | Admitting: Radiation Oncology

## 2019-05-15 ENCOUNTER — Other Ambulatory Visit: Payer: Self-pay

## 2019-05-15 VITALS — Ht 72.0 in | Wt 350.0 lb

## 2019-05-15 DIAGNOSIS — C61 Malignant neoplasm of prostate: Secondary | ICD-10-CM | POA: Diagnosis not present

## 2019-05-15 DIAGNOSIS — R972 Elevated prostate specific antigen [PSA]: Secondary | ICD-10-CM | POA: Diagnosis not present

## 2019-05-15 DIAGNOSIS — Z8042 Family history of malignant neoplasm of prostate: Secondary | ICD-10-CM | POA: Diagnosis not present

## 2019-05-15 NOTE — Progress Notes (Signed)
See progress note under physician encounter. 

## 2019-05-15 NOTE — Progress Notes (Signed)
Radiation Oncology         ([phone]) [phone] ________________________________  Initial outpatient Consultation - Conducted via Telephone due to current COVID-19 concerns for limiting [name] exposure  Name: [name] MRN: [id number]  Date: 05/15/2019  DOB: 10/04/1940  AQ:[id number], Bonnita Levan, MD  [name] Hughs, MD   REFERRING PHYSICIAN: [name] Hughs, MD  DIAGNOSIS: 78 y.o. [name] with Stage T1c adenocarcinoma of the prostate with Gleason score of 4+4, and PSA of 24.63.    ICD-10-CM   1. Malignant neoplasm of prostate ([place])  C61     HISTORY OF PRESENT ILLNESS: [name] is a 78 y.o. male with a diagnosis of prostate cancer. [name] was noted to have an elevated PSA of 22.91 in 11/2018 by his primary care physician, Dr. Rosita Kea.  [name] was also noted to have a hemorrhagic Proteus UTI at that time as well so [name] was treated with antibiotics.  Unfortunately, a repeat PSA performed after completion of treatment demonstrated the PSA to be further elevated at 24.6. Accordingly, [name] was referred for evaluation in urology by Dr. Louis Meckel on 03/29/2019,  digital rectal examination was not performed given his body habitus.  The [name] proceeded to transrectal ultrasound of the prostate and cystoscopy under anesthesia on 04/12/2019.  The prostate volume measured 51 cc.  Out of 12 core biopsies, 10 were positive.  The maximum Gleason score was 4+4, and this was seen in left apex lateral. Gleason 4+3 was seen in the right base, right mid, right apex, left base lateral, left base, left mid lateral, left mid, and left apex and Gleason 3+4 was seen in right mid lateral. Perineural invasion was noted in 5 of the left cores.  [name] had a bone scan for disease staging on 05/09/2019 which showed no evidence of osseous metastatic disease. [name] has not had CT A/P to date.  The [name] reviewed the biopsy results with his urologist and [name] has kindly been referred today for discussion of potential radiation treatment  options.  PREVIOUS RADIATION THERAPY: No  PAST MEDICAL HISTORY:  Past Medical History:  Diagnosis Date   1st degree AV block 11/09/2018   Noted on EKG    Anemia    Aortic stenosis, severe    S/p Edwards Sapien 3 Transcatheter Heart Valve (size 26 mm, model # [id number], serial # [id number])   Arthritis    Back pain    Bursitis    Cataract    left immature   Cellulitis 10/12/2015   CHF (congestive heart failure) (HCC)    Complication of anesthesia    Halucinations   Constipation    takes Miralax daily as well as Senokot daily   DDD (degenerative disc disease)    Gout    takes Allopurinol daily   Heart valve disorder    History of blood clots 1962   knee   History of blood transfusion    no abnormal reaction noted   History of shingles    Hyperlipidemia    takes Atorvastatin daily   Hypertension    takes Lisinopril daily   Joint pain    Low back pain 01/25/2017   Morbid obesity ([place])    Neck pain on left side 01/25/2017   OSA (obstructive sleep apnea)    Osteoarthritis    Peripheral edema    takes Furosemide daily   Peripheral neuropathy    takes Gabapentin daily   Peroneal palsy    significant right foot drop   Persistent atrial fibrillation  Pneumonia 25+yrs ago   hx of   Prostate cancer ([place])    RBBB 11/09/2018   Noted on EKG   Shortness of breath    Swelling of extremity    Thrombocytopenia (HCC)    Urinary frequency    Urinary urgency    Valvular heart disease    Venous stasis dermatitis       PAST SURGICAL HISTORY: Past Surgical History:  Procedure Laterality Date   CARDIAC CATHETERIZATION N/A 05/02/2015   Procedure: Right/Left Heart Cath and Coronary Angiography;  Surgeon: Burnell Blanks, MD;  Location: [facility];  Service: Cardiovascular;  Laterality: N/A;   CARDIOVERSION N/A 10/27/2018   Procedure: CARDIOVERSION;  Surgeon: Fay Records, MD;  Location: [facility] ENDOSCOPY;  Service: Cardiovascular;   Laterality: N/A;   COLONOSCOPY N/A 02/07/2013   Procedure: COLONOSCOPY;  Surgeon: Juanita Craver, MD;  Location: [facility] ENDOSCOPY;  Service: Endoscopy;  Laterality: N/A;   COLONOSCOPY N/A 02/09/2013   Procedure: COLONOSCOPY;  Surgeon: Beryle Beams, MD;  Location: [facility];  Service: Endoscopy;  Laterality: N/A;   ESOPHAGOGASTRODUODENOSCOPY N/A 02/07/2013   Procedure: ESOPHAGOGASTRODUODENOSCOPY (EGD);  Surgeon: Juanita Craver, MD;  Location: [facility] ENDOSCOPY;  Service: Endoscopy;  Laterality: N/A;   GIVENS CAPSULE STUDY N/A 02/09/2013   Procedure: GIVENS CAPSULE STUDY;  Surgeon: Beryle Beams, MD;  Location: [facility];  Service: Endoscopy;  Laterality: N/A;   HERNIA REPAIR     umbilical hernia   KNEE SURGERY Right    multiple knee surgeries due to complication of R TKA   LAMINOTOMY  1193   c6-t2   PACEMAKER IMPLANT N/A 04/27/2018   Procedure: PACEMAKER IMPLANT;  Surgeon: Constance Haw, MD;  Location: [facility] CV LAB;  Service: Cardiovascular;  Laterality: N/A;   PROSTATE BIOPSY N/A 04/12/2019   Procedure: BIOPSY TRANSRECTAL ULTRASONIC PROSTATE (TUBP) CYSTOSCOPY;  Surgeon: [name] Hughs, MD;  Location: [facility];  Service: Urology;  Laterality: N/A;   SPINE SURGERY     TEE WITHOUT CARDIOVERSION N/A 03/07/2015   Procedure: TRANSESOPHAGEAL ECHOCARDIOGRAM (TEE);  Surgeon: Josue Hector, MD;  Location: [facility] ENDOSCOPY;  Service: Cardiovascular;  Laterality: N/A;  ANES TO BRING PROPOFOL PER DOCTOR   TEE WITHOUT CARDIOVERSION N/A 06/10/2015   Procedure: TRANSESOPHAGEAL ECHOCARDIOGRAM (TEE);  Surgeon: Burnell Blanks, MD;  Location: [facility];  Service: Open Heart Surgery;  Laterality: N/A;   TOTAL KNEE ARTHROPLASTY     right   x4 left x 1    TRANSCATHETER AORTIC VALVE REPLACEMENT, TRANSFEMORAL Left 06/10/2015   Procedure: TRANSCATHETER AORTIC VALVE REPLACEMENT, TRANSFEMORAL;  Surgeon: Burnell Blanks, MD;  Location: [facility];  Service: Open Heart Surgery;  Laterality: Left;     FAMILY HISTORY:  Family History  Problem Relation Age of Onset   Other Father        POSSIBLE HEART ATTACK   Arthritis Sister        "crippling"   Prostate cancer Brother    Arthritis Sister    Breast cancer Neg Hx    Colon cancer Neg Hx     SOCIAL HISTORY:  Social History   Socioeconomic History   Marital status: Married    Spouse name: [name]   Number of children: 3   Years of education: Not on file   Highest education level: Not on file  Occupational History   Occupation: retired Chief Operating Officer strain: Not on file   Food insecurity    Worry: Not on file  Inability: Not on file   Transportation needs    Medical: Not on file    Non-medical: Not on file  Tobacco Use   Smoking status: Former Smoker    Years: 30.00    Types: Pipe    Quit date: 09/20/2006    Years since quitting: 12.6   Smokeless tobacco: Never Used  Substance and Sexual Activity   Alcohol use: No    Alcohol/week: 0.0 standard drinks   Drug use: No   Sexual activity: Not Currently    Comment: lives with wife, retired, no dietary restrictions  Lifestyle   Physical activity    Days per week: Not on file    Minutes per session: Not on file   Stress: Not on file  Relationships   Social connections    Talks on phone: Not on file    Gets together: Not on file    Attends religious service: Not on file    Active member of club or organization: Not on file    Attends meetings of clubs or organizations: Not on file    Relationship status: Not on file   Intimate partner violence    Fear of current or ex partner: Not on file    Emotionally abused: Not on file    Physically abused: Not on file    Forced sexual activity: Not on file  Other Topics Concern   Not on file  Social History Narrative   Not on file    ALLERGIES: Coumadin [warfarin sodium]  MEDICATIONS:  Current Outpatient Medications  Medication Sig Dispense Refill    acetaminophen (TYLENOL) 500 MG tablet Take 1,000 mg by mouth 2 (two) times daily.      atorvastatin (LIPITOR) 40 MG tablet Take 1 and a half tablet daily by mouth  (60mg ) ([name] taking differently: Take 60 mg by mouth daily. ) 135 tablet 5   Cholecalciferol (VITAMIN D3) 125 MCG (5000 UT) CAPS Take 5,000 Units by mouth daily.     Coenzyme Q10 (COQ10) 100 MG CAPS Take 300 mg by mouth daily.      CRANBERRY PO Take 1 capsule by mouth 2 (two) times a day.     ELIQUIS 5 MG TABS tablet TAKE 1 TABLET BY MOUTH TWICE A DAY 60 tablet 6   Ferrous Fumarate-Folic Acid (HEMATINIC/FOLIC ACID) 622-6 MG TABS Take 1 tablet by mouth 2 (two) times a week. 90 each 1   furosemide (LASIX) 20 MG tablet Take 20 mg by mouth 2 (two) times daily.      gabapentin (NEURONTIN) 300 MG capsule Take 1 capsule (300 mg total) by mouth 3 (three) times daily. ([name] taking differently: Take 300-600 mg by mouth See admin instructions. Take 300 mg by mouth in the morning and take 600 mg by mouth at bedtime) 270 capsule 2   Menthol, Topical Analgesic, (BENGAY EX) Apply 1 application topically at bedtime.     Misc Natural Products (TART CHERRY ADVANCED PO) Take 1,200 mg by mouth 2 (two) times a day.      Multiple Vitamins-Minerals (CENTRUM SILVER PO) Take 1 tablet by mouth daily.     Phenazopyridine HCl (AZO-STANDARD PO) Take by mouth.     polyethylene glycol (MIRALAX / GLYCOLAX) packet Take 17 g by mouth daily.     sennosides-docusate sodium (SENOKOT-S) 8.6-50 MG tablet Take 1 tablet by mouth daily.      vitamin C (ASCORBIC ACID) 500 MG tablet Take 500 mg by mouth daily.     No current facility-administered  medications for this encounter.     REVIEW OF SYSTEMS:  On review of systems, the [name] reports that [name] is doing well overall. [name] denies any chest pain, shortness of breath, cough, fevers, chills, night sweats, or unintended weight changes. [name] denies any bowel disturbances, and denies abdominal pain, nausea or  vomiting. [name] reports pain related to the effects of arthritis and back surgery, specifically in his entire back and bilateral knees and feet. (Per Dr. Rita Ohara, [name] is not a candidate for surgery, so his pain is managed by Dr. Maryjean Ka.) [name] relies on use of a motorized wheelchair due to limited mobility associated with his morbid obesity and arthritis.  His IPSS was 6, indicating mild urinary symptoms. [name] reports dysuria resolved with AZO, urinary frequency, and nocturia x1. [name] denies hematuria and incontinence. His SHIM was 1, indicating [name] has severe erectile dysfunction. A complete review of systems is obtained and is otherwise negative.    PHYSICAL EXAM:  Wt Readings from Last 3 Encounters:  05/15/19 (!) 350 lb (158.8 kg)  04/12/19 (!) 340 lb 8 oz (154.5 kg)  04/11/19 (!) 340 lb 8 oz (154.4 kg)   Temp Readings from Last 3 Encounters:  04/12/19 (!) 96.8 F (36 C)  04/11/19 99 F (37.2 C) (Oral)  03/13/19 98.3 F (36.8 C)   BP Readings from Last 3 Encounters:  04/12/19 134/79  04/11/19 132/74  03/13/19 129/87   Pulse Readings from Last 3 Encounters:  04/12/19 73  04/11/19 65  03/13/19 (!) 59   Pain Assessment Pain Score: 2 (Denies new pain. Endorses chronic pain in knee and back.) Pain Frequency: Constant Pain Loc: Back/Unable to assess due to telephone consult visit format.   KPS = 70  100 - Normal; no complaints; no evidence of disease. 90   - Able to carry on normal activity; minor signs or symptoms of disease. 80   - Normal activity with effort; some signs or symptoms of disease. 75   - Cares for self; unable to carry on normal activity or to do active work. 60   - Requires occasional assistance, but is able to care for most of his personal needs. 50   - Requires considerable assistance and frequent medical care. 90   - Disabled; requires special care and assistance. 99   - Severely disabled; hospital admission is indicated although death not imminent. 35   - Very sick;  hospital admission necessary; active supportive treatment necessary. 10   - Moribund; fatal processes progressing rapidly. 0     - Dead  Karnofsky DA, Abelmann Pe Ell, Craver LS and Burchenal JH [phone]) The use of the nitrogen mustards in the palliative treatment of carcinoma: with particular reference to bronchogenic carcinoma Cancer 1 634-56  LABORATORY DATA:  Lab Results  Component Value Date   WBC 7.1 04/11/2019   HGB 12.3 (L) 04/11/2019   HCT 40.6 04/11/2019   MCV 96.4 04/11/2019   PLT 134 (L) 04/11/2019   Lab Results  Component Value Date   NA 141 04/11/2019   K 4.7 04/11/2019   CL 98 04/11/2019   CO2 32 04/11/2019   Lab Results  Component Value Date   ALT 17 03/19/2019   AST 18 03/19/2019   ALKPHOS 76 03/19/2019   BILITOT 0.3 03/19/2019     RADIOGRAPHY: Nm Bone Scan Whole Body  Result Date: 05/09/2019 CLINICAL DATA:  [name] has hx of prostate cancer. Hx of spine surgery, pacemaker, right TKA x 4, DDD, morbid obesity, joint pain, back  pain, arthritis. ^21.83millicurie TC-MDP TECHNETIUM TC 17M MEDRONATE IV KITnew diagnosis EXAM: NUCLEAR MEDICINE WHOLE BODY BONE SCAN TECHNIQUE: Whole body anterior and posterior images were obtained approximately 3 hours after intravenous injection of radiopharmaceutical. RADIOPHARMACEUTICALS:  21.5 mCi Technetium-22m MDP IV COMPARISON:  None. FINDINGS: Movement degradation of the imaging. No uptake within the ribs suggest metastasis. There several foci of uptake within the thoracic spine and lumbar spine which cannot be well characterized due to [name] motion. No activity in the appendicular skeleton to suggest metastasis. Intense uptake in the RIGHT ankle joint favors trauma. IMPRESSION: 1. Indeterminate foci of uptake within the thoracic and lumbar spine. Motion degradation of the imaging. Differential include trauma, degenerative disease, and less likely metastatic disease. 2. No evidence of metastatic disease in the ribs or pelvis. 3. Intense  uptake in the RIGHT ankle joint is favored posttraumatic or degenerative. Consider radiograph of the RIGHT ankle Electronically Signed   By: Suzy Bouchard M.D.   On: 05/09/2019 15:16      IMPRESSION/PLAN: 1. 78 y.o. [name] with Stage T1c adenocarcinoma of the prostate with Gleason Score of 4+4, and PSA of 24.6. This visit was conducted via Telephone to spare the [name] unnecessary potential exposure in the healthcare setting during the current COVID-19 pandemic. We discussed the [name]'s workup and outlined the nature of prostate cancer in this setting. The [name]'s T stage, Gleason's score, and PSA put him into the high risk group. Accordingly, [name] is eligible for a variety of potential treatment options including LT ADT in combination with either 8 weeks of external radiation or 5 weeks of external radiation combined with a brachytherapy boost. [name] is not felt to be a good surgical candidate given his multiple medical co-morbidities and morbid obesity. We discussed the available radiation techniques, and focused on the details of logistics and delivery. We discussed and outlined the risks, benefits, short and long-term effects associated with radiotherapy and compared and contrasted these with prostatectomy. We discussed the role of SpaceOAR in reducing the rectal toxicity associated with radiotherapy. We also detailed the role of ADT in the treatment of high risk prostate cancer and outlined the associated side effects that could be expected with this therapy. [name] was encouraged to ask questions that were answered to his stated satisfaction.  At the end of the conversation the [name] is interested in proceeding with LT-ADT in combination with either 8 weeks of prostate IMRT or brachytherapy boost with 5 weeks of prostate IMRT.  [name] is leaning towards seed boost and external beam radiation but would like some additional time for further consideration of hjs options.   [name] is scheduled for a follow up  visit with Dr. Louis Meckel on 05/22/19 and anticipates reaching a decision by that time. We will share our discussion and recommendations with Dr. Louis Meckel and move forward with coordinating the start of ADT in the near future.  Regardless of his final treatment preference, we would anticipate beginning radiotherapy approximately 2 months from the start of ADT and we discussed the rationale behind this timing today in detail.  We also discussed placement of fiducial markers and SpaceOAR gel should [name] elect to proceed with prostate IMRT alone.  If [name] elects to proceed with brachytherapy boost, SpaceOAR gel will be placed at the time of the procedure and followed by 5 weeks of daily external beam radiation treatments.  We will obtain cardiac clearance from Dr. [name] for holding his Eliquis approximately 5-7 days prior to the procedure. [name] did not have any  issues holding his Eliquis prior to his recent biopsy and cystoscopy procedure.  We enjoyed meeting him today and look forard to continuing to participate in the care of this very nice [name].   Given current concerns for [name] exposure during the COVID-19 pandemic, this encounter was conducted via telephone. The [name] was notified in advance and was offered a MyChart meeting to allow for face to face communication but unfortunately reported that [name] did not have the appropriate resources/technology to support such a visit and instead preferred to proceed with telephone consult. The [name] has given verbal consent for this type of encounter. The time spent during this encounter was 60 minutes. The attendants for this meeting include Tyler Pita MD, Ashlyn Bruning PA-C, [place], and [name] [name]. During the encounter, Tyler Pita MD, Ashlyn Bruning PA-C, and scribe, Wilburn Mylar were located at [place].  [name] [name] was located at home.    Nicholos Johns, PA-C     Tyler Pita, MD  [facility] Direct Dial: [phone]   Fax: [phone] [email or web address]   Skype   [email or web address]  This document serves as a record of services personally performed by Tyler Pita, MD and Freeman Caldron, PA-C. It was created on their behalf by Wilburn Mylar, a trained medical scribe. The creation of this record is based on the scribe's personal observations and the provider's statements to them. This document has been checked and approved by the attending provider.

## 2019-05-16 ENCOUNTER — Encounter: Payer: Self-pay | Admitting: Cardiology

## 2019-05-16 ENCOUNTER — Telehealth: Payer: Self-pay | Admitting: *Deleted

## 2019-05-16 ENCOUNTER — Encounter: Payer: Self-pay | Admitting: Urology

## 2019-05-16 NOTE — Progress Notes (Signed)
After further consideration of treatment options, the patient has informed Dr. Louis Meckel that he would like to proceed with ADT only and forego curative treatment at this time.  Nicholos Johns, MMS, PA-C [facility] [place]: [phone]  Fax: [phone]

## 2019-05-16 NOTE — Telephone Encounter (Signed)
CALLED PATIENT TO INFORM OF ADT APPT. ON 06-04-19 - ARRIVAL TIME- 3:15 PM @ DR. HERRICK'S OFFICE, SPOKE WITH PATIENT AND HE IS AWARE OF THIS APPT.

## 2019-05-16 NOTE — Progress Notes (Signed)
Remote pacemaker transmission.   

## 2019-05-18 ENCOUNTER — Ambulatory Visit (INDEPENDENT_AMBULATORY_CARE_PROVIDER_SITE_OTHER): Payer: PPO | Admitting: Family Medicine

## 2019-05-18 ENCOUNTER — Other Ambulatory Visit: Payer: Self-pay

## 2019-05-18 DIAGNOSIS — E7800 Pure hypercholesterolemia, unspecified: Secondary | ICD-10-CM | POA: Diagnosis not present

## 2019-05-18 DIAGNOSIS — R739 Hyperglycemia, unspecified: Secondary | ICD-10-CM

## 2019-05-18 DIAGNOSIS — R062 Wheezing: Secondary | ICD-10-CM | POA: Diagnosis not present

## 2019-05-18 DIAGNOSIS — C61 Malignant neoplasm of prostate: Secondary | ICD-10-CM

## 2019-05-18 DIAGNOSIS — G4733 Obstructive sleep apnea (adult) (pediatric): Secondary | ICD-10-CM | POA: Diagnosis not present

## 2019-05-18 DIAGNOSIS — I10 Essential (primary) hypertension: Secondary | ICD-10-CM

## 2019-05-18 MED ORDER — ATORVASTATIN CALCIUM 40 MG PO TABS: ORAL_TABLET | ORAL | 5 refills | Status: DC

## 2019-05-18 MED ORDER — GABAPENTIN 300 MG PO CAPS: 300.0000 mg | ORAL_CAPSULE | Freq: Three times a day (TID) | ORAL | 2 refills | Status: DC

## 2019-05-20 ENCOUNTER — Encounter: Payer: Self-pay | Admitting: Family Medicine

## 2019-05-20 NOTE — Assessment & Plan Note (Signed)
Tolerating statin, encouraged heart healthy diet, avoid trans fats, minimize simple carbs and saturated fats. Increase exercise as tolerated 

## 2019-05-20 NOTE — Progress Notes (Addendum)
Virtual Visit via phone Note  I connected with [name] on 05/18/19 at 10:40 AM EDT by a phone enabled telemedicine application and verified that I am speaking with the correct person using two identifiers.  Location: Patient: home Provider: home   I discussed the limitations of evaluation and management by telemedicine and the availability of in person appointments. The patient expressed understanding and agreed to proceed. [name], CMA was able to get patient set up on visit, phone after being unable to obtain a video visit.    Subjective:    Patient ID: [name], male    DOB: 1940/11/07, 78 y.o.   MRN: [id number]  No chief complaint on file.   HPI Patient is in today for follow up on chronic medical concerns including hypertension, hyperlipidemia, and now prostate cancer. He is following with urology and they are deciding on course of treatment. No recent febrile illness. No other concerns. Denies CP/palp/HA/congestion/fevers/GI or GU c/o. Taking meds as prescribed.  Past Medical History:  Diagnosis Date  . 1st degree AV block 11/09/2018   Noted on EKG   . Anemia   . Aortic stenosis, severe    S/p Edwards Sapien 3 Transcatheter Heart Valve (size 26 mm, model # [id number], serial # [id number])  . Arthritis   . Back pain   . Bursitis   . Cataract    left immature  . Cellulitis 10/12/2015  . CHF (congestive heart failure) ([place])   . Complication of anesthesia    Halucinations  . Constipation    takes Miralax daily as well as Senokot daily  . DDD (degenerative disc disease)   . Gout    takes Allopurinol daily  . Heart valve disorder   . History of blood clots 1962   knee  . History of blood transfusion    no abnormal reaction noted  . History of shingles   . Hyperlipidemia    takes Atorvastatin daily  . Hypertension    takes Lisinopril daily  . Joint pain   . Low back pain 01/25/2017  . Morbid obesity ([place])   . Neck pain on left side 01/25/2017  . OSA  (obstructive sleep apnea)   . Osteoarthritis   . Peripheral edema    takes Furosemide daily  . Peripheral neuropathy    takes Gabapentin daily  . Peroneal palsy    significant right foot drop  . Persistent atrial fibrillation   . Pneumonia 25+yrs ago   hx of  . Prostate cancer ([place])   . Prostate cancer ([place]) 02/13/2019  . RBBB 11/09/2018   Noted on EKG  . Shortness of breath   . Swelling of extremity   . Thrombocytopenia ([place])   . Urinary frequency   . Urinary urgency   . Valvular heart disease   . Venous stasis dermatitis     Past Surgical History:  Procedure Laterality Date  . CARDIAC CATHETERIZATION N/A 05/02/2015   Procedure: Right/Left Heart Cath and Coronary Angiography;  Surgeon: Burnell Blanks, MD;  Location: [place] [facility];  Service: Cardiovascular;  Laterality: N/A;  . CARDIOVERSION N/A 10/27/2018   Procedure: CARDIOVERSION;  Surgeon: Fay Records, MD;  Location: [facility] ENDOSCOPY;  Service: Cardiovascular;  Laterality: N/A;  . COLONOSCOPY N/A 02/07/2013   Procedure: COLONOSCOPY;  Surgeon: Juanita Craver, MD;  Location: [facility] ENDOSCOPY;  Service: Endoscopy;  Laterality: N/A;  . COLONOSCOPY N/A 02/09/2013   Procedure: COLONOSCOPY;  Surgeon: Beryle Beams, MD;  Location: [place];  Service:  Endoscopy;  Laterality: N/A;  . ESOPHAGOGASTRODUODENOSCOPY N/A 02/07/2013   Procedure: ESOPHAGOGASTRODUODENOSCOPY (EGD);  Surgeon: Juanita Craver, MD;  Location: [facility] ENDOSCOPY;  Service: Endoscopy;  Laterality: N/A;  . GIVENS CAPSULE STUDY N/A 02/09/2013   Procedure: GIVENS CAPSULE STUDY;  Surgeon: Beryle Beams, MD;  Location: [place];  Service: Endoscopy;  Laterality: N/A;  . HERNIA REPAIR     umbilical hernia  . KNEE SURGERY Right    multiple knee surgeries due to complication of R TKA  . LAMINOTOMY  3428   c6-t2  . PACEMAKER IMPLANT N/A 04/27/2018   Procedure: PACEMAKER IMPLANT;  Surgeon: Constance Haw, MD;  Location: [place] CV LAB;  Service: Cardiovascular;   Laterality: N/A;  . PROSTATE BIOPSY N/A 04/12/2019   Procedure: BIOPSY TRANSRECTAL ULTRASONIC PROSTATE (TUBP) CYSTOSCOPY;  Surgeon: [name] Hughs, MD;  [facility]: [facility];  Service: Urology;  Laterality: N/A;  . SPINE SURGERY    . TEE WITHOUT CARDIOVERSION N/A 03/07/2015   Procedure: TRANSESOPHAGEAL ECHOCARDIOGRAM (TEE);  Surgeon: Josue Hector, MD;  Location: [facility] ENDOSCOPY;  Service: Cardiovascular;  Laterality: N/A;  ANES TO BRING PROPOFOL PER DOCTOR  . TEE WITHOUT CARDIOVERSION N/A 06/10/2015   Procedure: TRANSESOPHAGEAL ECHOCARDIOGRAM (TEE);  Surgeon: Burnell Blanks, MD;  Location: [place];  Service: Open Heart Surgery;  Laterality: N/A;  . TOTAL KNEE ARTHROPLASTY     right   x4 left x 1   . TRANSCATHETER AORTIC VALVE REPLACEMENT, TRANSFEMORAL Left 06/10/2015   Procedure: TRANSCATHETER AORTIC VALVE REPLACEMENT, TRANSFEMORAL;  Surgeon: Burnell Blanks, MD;  Location: [place];  Service: Open Heart Surgery;  Laterality: Left;    Family History  Problem Relation Age of Onset  . Other Father        POSSIBLE HEART ATTACK  . Arthritis Sister        "crippling"  . Prostate cancer Brother   . Arthritis Sister   . Breast cancer Neg Hx   . Colon cancer Neg Hx     Social History   Socioeconomic History  . Marital status: Married    Spouse name: [name]  . Number of children: 3  . Years of education: Not on file  . Highest education level: Not on file  Occupational History  . Occupation: retired Investment banker, operational  . Financial resource strain: Not on file  . Food insecurity    Worry: Not on file    Inability: Not on file  . Transportation needs    Medical: Not on file    Non-medical: Not on file  Tobacco Use  . Smoking status: Former Smoker    Years: 30.00    Types: Pipe    Quit date: 09/20/2006    Years since quitting: 12.6  . Smokeless tobacco: Never Used  Substance and Sexual Activity  . Alcohol use: No    Alcohol/week: 0.0 standard drinks  . Drug use:  No  . Sexual activity: Not Currently    Comment: lives with wife, retired, no dietary restrictions  Lifestyle  . Physical activity    Days per week: Not on file    Minutes per session: Not on file  . Stress: Not on file  Relationships  . Social Herbalist on phone: Not on file    Gets together: Not on file    Attends religious service: Not on file    Active member of club or organization: Not on file    Attends meetings of clubs or organizations:  Not on file    Relationship status: Not on file  . Intimate partner violence    Fear of current or ex partner: Not on file    Emotionally abused: Not on file    Physically abused: Not on file    Forced sexual activity: Not on file  Other Topics Concern  . Not on file  Social History Narrative  . Not on file    Outpatient Medications Prior to Visit  Medication Sig Dispense Refill  . acetaminophen (TYLENOL) 500 MG tablet Take 1,000 mg by mouth 2 (two) times daily.     . Cholecalciferol (VITAMIN D3) 125 MCG (5000 UT) CAPS Take 5,000 Units by mouth daily.    . Coenzyme Q10 (COQ10) 100 MG CAPS Take 300 mg by mouth daily.     [name] CRANBERRY PO Take 1 capsule by mouth 2 (two) times a day.    [name] ELIQUIS 5 MG TABS tablet TAKE 1 TABLET BY MOUTH TWICE A DAY 60 tablet 6  . Ferrous Fumarate-Folic Acid (HEMATINIC/FOLIC ACID) 700-1 MG TABS Take 1 tablet by mouth 2 (two) times a week. 90 each 1  . furosemide (LASIX) 20 MG tablet Take 20 mg by mouth 2 (two) times daily.     . Menthol, Topical Analgesic, (BENGAY EX) Apply 1 application topically at bedtime.    . Misc Natural Products (TART CHERRY ADVANCED PO) Take 1,200 mg by mouth 2 (two) times a day.     . Multiple Vitamins-Minerals (CENTRUM SILVER PO) Take 1 tablet by mouth daily.    . Phenazopyridine HCl (AZO-STANDARD PO) Take by mouth.    . polyethylene glycol (MIRALAX / GLYCOLAX) packet Take 17 g by mouth daily.    . sennosides-docusate sodium (SENOKOT-S) 8.6-50 MG tablet Take 1 tablet by  mouth daily.     . vitamin C (ASCORBIC ACID) 500 MG tablet Take 500 mg by mouth daily.    [name] atorvastatin (LIPITOR) 40 MG tablet Take 1 and a half tablet daily by mouth  (60mg ) (Patient taking differently: Take 60 mg by mouth daily. ) 135 tablet 5  . gabapentin (NEURONTIN) 300 MG capsule Take 1 capsule (300 mg total) by mouth 3 (three) times daily. (Patient taking differently: Take 300-600 mg by mouth See admin instructions. Take 300 mg by mouth in the morning and take 600 mg by mouth at bedtime) 270 capsule 2   No facility-administered medications prior to visit.     Allergies  Allergen Reactions  . Coumadin [Warfarin Sodium] Other (See Comments)    States he can't be on this-bleeds out    Review of Systems  Constitutional: Negative for fever and malaise/fatigue.  HENT: Negative for congestion.   Eyes: Negative for blurred vision.  Respiratory: Negative for shortness of breath.   Cardiovascular: Negative for chest pain, palpitations and leg swelling.  Gastrointestinal: Negative for abdominal pain, blood in stool and nausea.  Genitourinary: Positive for frequency and urgency. Negative for dysuria and hematuria.  Musculoskeletal: Negative for falls.  Skin: Negative for rash.  Neurological: Negative for dizziness, loss of consciousness and headaches.  Endo/Heme/Allergies: Negative for environmental allergies.  Psychiatric/Behavioral: Negative for depression. The patient is not nervous/anxious.        Objective:    Physical Exam unable to obtain via phone  There were no vitals taken for this visit. Wt Readings from Last 3 Encounters:  05/15/19 (!) 350 lb (158.8 kg)  04/12/19 (!) 340 lb 8 oz (154.5 kg)  04/11/19 (!) 340 lb 8 oz (  154.4 kg)    Diabetic Foot Exam - Simple   No data filed     Lab Results  Component Value Date   WBC 7.1 04/11/2019   HGB 12.3 (L) 04/11/2019   HCT 40.6 04/11/2019   PLT 134 (L) 04/11/2019   GLUCOSE 98 04/11/2019   CHOL 117 03/19/2019   TRIG  135.0 03/19/2019   HDL 29.50 (L) 03/19/2019   LDLDIRECT 85.0 07/28/2017   LDLCALC 60 03/19/2019   ALT 17 03/19/2019   AST 18 03/19/2019   NA 141 04/11/2019   K 4.7 04/11/2019   CL 98 04/11/2019   CREATININE 1.00 04/11/2019   BUN 30 (H) 04/11/2019   CO2 32 04/11/2019   TSH 1.19 03/19/2019   PSA 24.63 (H) 03/19/2019   INR 1.32 06/10/2015   HGBA1C 5.5 03/19/2019    Lab Results  Component Value Date   TSH 1.19 03/19/2019   Lab Results  Component Value Date   WBC 7.1 04/11/2019   HGB 12.3 (L) 04/11/2019   HCT 40.6 04/11/2019   MCV 96.4 04/11/2019   PLT 134 (L) 04/11/2019   Lab Results  Component Value Date   NA 141 04/11/2019   K 4.7 04/11/2019   CO2 32 04/11/2019   GLUCOSE 98 04/11/2019   BUN 30 (H) 04/11/2019   CREATININE 1.00 04/11/2019   BILITOT 0.3 03/19/2019   ALKPHOS 76 03/19/2019   AST 18 03/19/2019   ALT 17 03/19/2019   PROT 6.1 03/19/2019   ALBUMIN 3.7 03/19/2019   CALCIUM 9.1 04/11/2019   ANIONGAP 11 04/11/2019   GFR 71.70 03/19/2019   Lab Results  Component Value Date   CHOL 117 03/19/2019   Lab Results  Component Value Date   HDL 29.50 (L) 03/19/2019   Lab Results  Component Value Date   LDLCALC 60 03/19/2019   Lab Results  Component Value Date   TRIG 135.0 03/19/2019   Lab Results  Component Value Date   CHOLHDL 4 03/19/2019   Lab Results  Component Value Date   HGBA1C 5.5 03/19/2019       Assessment & Plan:   Problem List Items Addressed This Visit    HYPERCHOLESTEROLEMIA    Tolerating statin, encouraged heart healthy diet, avoid trans fats, minimize simple carbs and saturated fats. Increase exercise as tolerated      Relevant Medications   atorvastatin (LIPITOR) 40 MG tablet   Hyperglycemia    hgba1c acceptable, minimize simple carbs. Increase exercise as tolerated.       Essential hypertension    Check vitals weekly, no changes to meds. Encouraged heart healthy diet such as the DASH diet and exercise as tolerated.        Relevant Medications   atorvastatin (LIPITOR) 40 MG tablet   Prostate cancer [facility])    Following with urology and awaiting his treatment         I am having Hollins D. Fuentes maintain his sennosides-docusate sodium, acetaminophen, CoQ10, polyethylene glycol, Misc Natural Products (TART CHERRY ADVANCED PO), Ferrous Fumarate-Folic Acid, vitamin C, (Menthol, Topical Analgesic, (BENGAY EX)), furosemide, Multiple Vitamins-Minerals (CENTRUM SILVER PO), Vitamin D3, CRANBERRY PO, Eliquis, Phenazopyridine HCl (AZO-STANDARD PO), atorvastatin, and gabapentin.  Meds ordered this encounter  Medications  . atorvastatin (LIPITOR) 40 MG tablet    Sig: Take 1 and a half tablet daily by mouth  (60mg )    Dispense:  135 tablet    Refill:  5  . gabapentin (NEURONTIN) 300 MG capsule    Sig: Take 1 capsule (  300 mg total) by mouth 3 (three) times daily.    Dispense:  270 capsule    Refill:  2  I discussed the assessment and treatment plan with the patient. The patient was provided an opportunity to ask questions and all were answered. The patient agreed with the plan and demonstrated an understanding of the instructions.   The patient was advised to call back or seek an in-person evaluation if the symptoms worsen or if the condition fails to improve as anticipated.  I provided 25 minutes of non-face-to-face time during this encounter.   Penni Homans, MD

## 2019-05-20 NOTE — Assessment & Plan Note (Signed)
Following with urology and awaiting his treatment

## 2019-05-20 NOTE — Assessment & Plan Note (Signed)
Check vitals weekly, no changes to meds. Encouraged heart healthy diet such as the DASH diet and exercise as tolerated.  

## 2019-05-20 NOTE — Assessment & Plan Note (Signed)
hgba1c acceptable, minimize simple carbs. Increase exercise as tolerated.  

## 2019-05-22 ENCOUNTER — Ambulatory Visit: Payer: PPO | Admitting: Cardiology

## 2019-05-22 ENCOUNTER — Encounter: Payer: Self-pay | Admitting: Cardiology

## 2019-05-22 ENCOUNTER — Other Ambulatory Visit: Payer: Self-pay

## 2019-05-22 VITALS — BP 122/64 | HR 60 | Ht 72.0 in

## 2019-05-22 DIAGNOSIS — I441 Atrioventricular block, second degree: Secondary | ICD-10-CM | POA: Diagnosis not present

## 2019-05-22 NOTE — Progress Notes (Signed)
Electrophysiology Office Note   Date:  05/22/2019   ID:  [name], DOB Feb 24, 1941, MRN [id number]  PCP:  [name], MD  Cardiologist:  [name] Primary Electrophysiologist:  Watson Robarge [name], MD    No chief complaint on file.    History of Present Illness: [name] is a 78 y.o. male who is being seen today for the evaluation of 2:1 AV block, pacemaker at the request of [name], MD. Presenting today for electrophysiology evaluation.  He has a history of aortic stenosis status post TAVR who presented to the hospital August 2019 with acute onset of weakness, and dizziness.  He was found to have Mobitz 1 AV block as well as 2-1 AV block with heart rates in the 30s to 40s.  He underwent implant of a Saint Jude dual-chamber pacemaker on 04/27/2018.  Today, denies symptoms of palpitations, chest pain, shortness of breath, orthopnea, PND, lower extremity edema, claudication, dizziness, presyncope, syncope, bleeding, or neurologic sequela. The patient is tolerating medications without difficulties.  He has prostate cancer.  He was initially going to have radiation therapy, but he has decided against this.  He Elenie Coven consider to have hormone therapy.  Past Medical History:  Diagnosis Date  . 1st degree AV block 11/09/2018   Noted on EKG   . Anemia   . Aortic stenosis, severe    S/p Edwards Sapien 3 Transcatheter Heart Valve (size 26 mm, model # [id number], serial # [id number])  . Arthritis   . Back pain   . Bursitis   . Cataract    left immature  . Cellulitis 10/12/2015  . CHF (congestive heart failure) ([place])   . Complication of anesthesia    Halucinations  . Constipation    takes Miralax daily as well as Senokot daily  . DDD (degenerative disc disease)   . Gout    takes Allopurinol daily  . Heart valve disorder   . History of blood clots 1962   knee  . History of blood transfusion    no abnormal reaction noted  . History of shingles   . Hyperlipidemia    takes  Atorvastatin daily  . Hypertension    takes Lisinopril daily  . Joint pain   . Low back pain 01/25/2017  . Morbid obesity ([place])   . Neck pain on left side 01/25/2017  . OSA (obstructive sleep apnea)   . Osteoarthritis   . Peripheral edema    takes Furosemide daily  . Peripheral neuropathy    takes Gabapentin daily  . Peroneal palsy    significant right foot drop  . Persistent atrial fibrillation   . Pneumonia 25+yrs ago   hx of  . Prostate cancer ([place])   . Prostate cancer ([place]) 02/13/2019  . RBBB 11/09/2018   Noted on EKG  . Shortness of breath   . Swelling of extremity   . Thrombocytopenia ([place])   . Urinary frequency   . Urinary urgency   . Valvular heart disease   . Venous stasis dermatitis    Past Surgical History:  Procedure Laterality Date  . CARDIAC CATHETERIZATION N/A 05/02/2015   Procedure: Right/Left Heart Cath and Coronary Angiography;  Surgeon: Burnell Blanks, MD;  Location: [place] CV LAB;  Service: Cardiovascular;  Laterality: N/A;  . CARDIOVERSION N/A 10/27/2018   Procedure: CARDIOVERSION;  Surgeon: Fay Records, MD;  Location: [facility] ENDOSCOPY;  Service: Cardiovascular;  Laterality: N/A;  . COLONOSCOPY N/A 02/07/2013   Procedure: COLONOSCOPY;  Surgeon: Juanita Craver, MD;  Location: [facility] ENDOSCOPY;  Service: Endoscopy;  Laterality: N/A;  . COLONOSCOPY N/A 02/09/2013   Procedure: COLONOSCOPY;  Surgeon: Beryle Beams, MD;  Location: [place];  Service: Endoscopy;  Laterality: N/A;  . ESOPHAGOGASTRODUODENOSCOPY N/A 02/07/2013   Procedure: ESOPHAGOGASTRODUODENOSCOPY (EGD);  Surgeon: Juanita Craver, MD;  Location: [facility] ENDOSCOPY;  Service: Endoscopy;  Laterality: N/A;  . GIVENS CAPSULE STUDY N/A 02/09/2013   Procedure: GIVENS CAPSULE STUDY;  Surgeon: Beryle Beams, MD;  Location: [place];  Service: Endoscopy;  Laterality: N/A;  . HERNIA REPAIR     umbilical hernia  . KNEE SURGERY Right    multiple knee surgeries due to complication of R TKA  . LAMINOTOMY  4665    c6-t2  . PACEMAKER IMPLANT N/A 04/27/2018   Procedure: PACEMAKER IMPLANT;  Surgeon: Constance Haw, MD;  Location: [place] CV LAB;  Service: Cardiovascular;  Laterality: N/A;  . PROSTATE BIOPSY N/A 04/12/2019   Procedure: BIOPSY TRANSRECTAL ULTRASONIC PROSTATE (TUBP) CYSTOSCOPY;  Surgeon: [name] Hughs, MD;  [facility];  Service: Urology;  Laterality: N/A;  . SPINE SURGERY    . TEE WITHOUT CARDIOVERSION N/A 03/07/2015   Procedure: TRANSESOPHAGEAL ECHOCARDIOGRAM (TEE);  Surgeon: Josue Hector, MD;  Location: [facility] ENDOSCOPY;  Service: Cardiovascular;  Laterality: N/A;  ANES TO BRING PROPOFOL PER DOCTOR  . TEE WITHOUT CARDIOVERSION N/A 06/10/2015   Procedure: TRANSESOPHAGEAL ECHOCARDIOGRAM (TEE);  Surgeon: Burnell Blanks, MD;  Location: [place];  Service: Open Heart Surgery;  Laterality: N/A;  . TOTAL KNEE ARTHROPLASTY     right   x4 left x 1   . TRANSCATHETER AORTIC VALVE REPLACEMENT, TRANSFEMORAL Left 06/10/2015   Procedure: TRANSCATHETER AORTIC VALVE REPLACEMENT, TRANSFEMORAL;  Surgeon: Burnell Blanks, MD;  Location: [place];  Service: Open Heart Surgery;  Laterality: Left;     Current Outpatient Medications  Medication Sig Dispense Refill  . acetaminophen (TYLENOL) 500 MG tablet Take 1,000 mg by mouth 2 (two) times daily.     [name] atorvastatin (LIPITOR) 40 MG tablet Take 1 and a half tablet daily by mouth  (78m) 135 tablet 5  . Cholecalciferol (VITAMIN D3) 125 MCG (5000 UT) CAPS Take 5,000 Units by mouth daily.    . Coenzyme Q10 (COQ10) 100 MG CAPS Take 300 mg by mouth daily.     .Marland KitchenCRANBERRY PO Take 1 capsule by mouth 2 (two) times a day.    .Marland KitchenELIQUIS 5 MG TABS tablet TAKE 1 TABLET BY MOUTH TWICE A DAY 60 tablet 6  . Ferrous Fumarate-Folic Acid (HEMATINIC/FOLIC ACID) 3993-5MG TABS Take 1 tablet by mouth 2 (two) times a week. 90 each 1  . furosemide (LASIX) 20 MG tablet Take 20 mg by mouth 2 (two) times daily.     .Marland Kitchengabapentin (NEURONTIN) 300 MG capsule Take 1  capsule (300 mg total) by mouth 3 (three) times daily. 270 capsule 2  . Misc Natural Products (TART CHERRY ADVANCED PO) Take 1,200 mg by mouth 2 (two) times a day.     . Multiple Vitamins-Minerals (CENTRUM SILVER PO) Take 1 tablet by mouth daily.    . Phenazopyridine HCl (AZO-STANDARD PO) Take by mouth.    . polyethylene glycol (MIRALAX / GLYCOLAX) packet Take 17 g by mouth daily.    . sennosides-docusate sodium (SENOKOT-S) 8.6-50 MG tablet Take 1 tablet by mouth daily.     . vitamin C (ASCORBIC ACID) 500 MG tablet Take 500 mg by mouth 2 (two) times daily.  No current facility-administered medications for this visit.     Allergies:   Coumadin [warfarin sodium]   Social History:  The patient  reports that he quit smoking about 12 years ago. His smoking use included pipe. He quit after 30.00 years of use. He has never used smokeless tobacco. He reports that he does not drink alcohol or use drugs.   Family History:  The patient's family history includes Arthritis in his sister and sister; Other in his father; Prostate cancer in his brother.    ROS:  Please see the history of present illness.   Otherwise, review of systems is positive for none.   All other systems are reviewed and negative.   PHYSICAL EXAM: VS:  BP 122/64   Pulse 60   Ht 6' (1.829 m)   SpO2 93%   BMI 47.47 kg/m  , BMI Body mass index is 47.47 kg/m. GEN: Well nourished, well developed, in no acute distress  HEENT: normal  Neck: no JVD, carotid bruits, or masses Cardiac: RRR; no murmurs, rubs, or gallops,no edema  Respiratory:  clear to auscultation bilaterally, normal work of breathing GI: soft, nontender, nondistended, + BS MS: no deformity or atrophy  Skin: warm and dry, device site well healed Neuro:  Strength and sensation are intact Psych: euthymic mood, full affect  EKG:  EKG is ordered today. Personal review of the ekg ordered shows AV paced  Personal review of the device interrogation today. Results  in [place]: 10/27/2018: B Natriuretic Peptide 125.9 03/19/2019: ALT 17; TSH 1.19 04/11/2019: BUN 30; Creatinine, Ser 1.00; Hemoglobin 12.3; Platelets 134; Potassium 4.7; Sodium 141    Lipid Panel     Component Value Date/Time   CHOL 117 03/19/2019 1128   CHOL 140 08/22/2018 0951   TRIG 135.0 03/19/2019 1128   HDL 29.50 (L) 03/19/2019 1128   HDL 30 (L) 08/22/2018 0951   CHOLHDL 4 03/19/2019 1128   VLDL 27.0 03/19/2019 1128   LDLCALC 60 03/19/2019 1128   LDLCALC 85 08/22/2018 0951   LDLDIRECT 85.0 07/28/2017 1628     Wt Readings from Last 3 Encounters:  05/15/19 (!) 350 lb (158.8 kg)  04/12/19 (!) 340 lb 8 oz (154.5 kg)  04/11/19 (!) 340 lb 8 oz (154.4 kg)      Other studies Reviewed: Additional studies/ records that were reviewed today include: TTE 04/27/18  Review of the above records today demonstrates:  - Left ventricle: The cavity size was normal. Wall thickness was   increased in a pattern of moderate LVH. Systolic function was   normal. The estimated ejection fraction was in the range of 60%   to 65%. Wall motion was normal; there were no regional wall   motion abnormalities. The study is not technically sufficient to   allow evaluation of LV diastolic function. - Aortic valve: s/p TAVR. Mild paravalvular leak. Mean gradient   (S): 22 mm Hg. Peak gradient (S): 42 mm Hg. Valve area (VTI): 1.6   cm^2. Valve area (Vmax): 1.76 cm^2. - Mitral valve: Heavy MAC. Moderate mitral stenosis. Trivial   regurgitation. Mean gradient (D): 13 mm Hg. Valve area by   pressure half-time: 1.17 cm^2. - Left atrium: Severely dilated. - Right ventricle: The cavity size was mildly dilated. - Right atrium: Severely dilated. - Systemic veins: The IVC measures >2.1 cm, but collapses more than   50%, suggesting an elevated RA pressure of 8 mmHg.   ASSESSMENT AND PLAN:  1.  2-1 AV  block: Status post Saint Jude dual-chamber pacemaker implanted 05/08/2018.  Device functioning  appropriately.  No changes.  2.  Aortic stenosis: Status post TAVR.  Plan per primary cardiology.  3.  Hypertension: well controlled  Current medicines are reviewed at length with the patient today.   The patient does not have concerns regarding his medicines.  The following changes were made today:  none  Labs/ tests ordered today include:  Orders Placed This Encounter  Procedures  . EKG 12-Lead     Disposition:   FU with [name] 12 months  Signed, Marvyn Torrez [name], MD  05/22/2019 3:00 PM     [address] [place] [address] [phone] (office) [phone] (fax)

## 2019-05-23 DIAGNOSIS — J9622 Acute and chronic respiratory failure with hypercapnia: Secondary | ICD-10-CM | POA: Diagnosis not present

## 2019-05-23 DIAGNOSIS — R062 Wheezing: Secondary | ICD-10-CM | POA: Diagnosis not present

## 2019-05-23 DIAGNOSIS — J449 Chronic obstructive pulmonary disease, unspecified: Secondary | ICD-10-CM | POA: Diagnosis not present

## 2019-05-23 DIAGNOSIS — G4733 Obstructive sleep apnea (adult) (pediatric): Secondary | ICD-10-CM | POA: Diagnosis not present

## 2019-05-24 ENCOUNTER — Encounter: Payer: Self-pay | Admitting: Medical Oncology

## 2019-05-24 NOTE — Progress Notes (Signed)
[name] consulted with Dr. Tammi Klippel 8/25 regarding radiation options for prostate cancer. After consult, he has decided on ADT only. He has a scheduled appointment with Dr. Louis Meckel 9/14 to discuss.

## 2019-05-29 ENCOUNTER — Other Ambulatory Visit: Payer: Self-pay

## 2019-05-29 ENCOUNTER — Other Ambulatory Visit: Payer: Self-pay | Admitting: Pharmacist

## 2019-05-29 NOTE — Patient Outreach (Signed)
[name]) Care Management  [place]   05/29/2019  [name] 1941-09-13 [id number]  Reason for referral: Medication Assistance  Referral source: [employer] Current insurance: [employer]  PMHx includes but not limited to:  HTN, Afib, HLD  Outreach:  Successful telephone call with [name].  HIPAA identifiers verified. Patient is agreeable to review medications telephonically.  Comprehensive medication review performed.  Updated medicaiton list in the EMR.  Patient presented with the option to apply for medication assistance program through [employer] for Eliquis.  He stated his copay has increased to >$100 for 39-month supply.  After hearing about the program, he declined to participate in patient assistance program.  He states they have the means to purchase the prescription.  Patient appreciates the offer for assistance.   Lab Results  Component Value Date   CREATININE 1.00 04/11/2019   CREATININE 1.01 03/19/2019   CREATININE 1.09 11/28/2018    Lab Results  Component Value Date   HGBA1C 5.5 03/19/2019    Lipid Panel     Component Value Date/Time   CHOL 117 03/19/2019 1128   CHOL 140 08/22/2018 0951   TRIG 135.0 03/19/2019 1128   HDL 29.50 (L) 03/19/2019 1128   HDL 30 (L) 08/22/2018 0951   CHOLHDL 4 03/19/2019 1128   VLDL 27.0 03/19/2019 1128   LDLCALC 60 03/19/2019 1128   LDLCALC 85 08/22/2018 0951   LDLDIRECT 85.0 07/28/2017 1628    BP Readings from Last 3 Encounters:  05/22/19 122/64  04/12/19 134/79  04/11/19 132/74    Allergies  Allergen Reactions  . Coumadin [Warfarin Sodium] Other (See Comments)    States he can't be on this-bleeds out    Medications Reviewed Today    Reviewed by Lavera Guise, [facility] (Pharmacist) on 05/29/19 at [place] List Status: <None>  Medication Order Taking? Sig Documenting Provider Last Dose Status Informant  acetaminophen (TYLENOL) 500 MG tablet  [id number] No Take 1,000 mg by mouth 2 (two) times daily.  [provider] Taking Active Self  atorvastatin (LIPITOR) 40 MG tablet [id number] No Take 1 and a half tablet daily by mouth  (60mg ) [name], MD Taking Active   Cholecalciferol (VITAMIN D3) 125 MCG (5000 UT) CAPS [id number] No Take 5,000 Units by mouth daily. [provider] Taking Active Self  Coenzyme Q10 (COQ10) 100 MG CAPS [id number] No Take 300 mg by mouth daily.  [provider] Taking Active Self           Med Note Burt Knack, STACI   Fri Jun 16, 2018 10:06 AM)    CRANBERRY PO [id number] No Take 1 capsule by mouth 2 (two) times a day. [provider] Taking Active Self  ELIQUIS 5 MG TABS tablet [id number] No TAKE 1 TABLET BY MOUTH TWICE A DAY Sherran Needs, NP Taking Active   Ferrous Fumarate-Folic Acid (HEMATINIC/FOLIC ACID) 017-4 MG TABS [id number] No Take 1 tablet by mouth 2 (two) times a week. [name], MD Taking Active Self           Med Note Luana Shu, NATASHA   Wed Feb 07, 2019 10:51 AM)    furosemide (LASIX) 20 MG tablet [id number] No Take 20 mg by mouth 2 (two) times daily.  [provider] Taking Active Self  gabapentin (NEURONTIN) 300 MG capsule [id number] No Take 1 capsule (300 mg total) by mouth 3 (three) times daily. [name], MD Taking Active  Misc Natural Products (TART CHERRY ADVANCED PO) [id number] No Take 1,200 mg by mouth 2 (two) times a day.  [provider] Taking Active Self  Multiple Vitamins-Minerals (CENTRUM SILVER PO) [id number] No Take 1 tablet by mouth daily. [provider] Taking Active Self  Phenazopyridine HCl (AZO-STANDARD PO) [id number] No Take by mouth. [provider] Taking Active   polyethylene glycol (MIRALAX / GLYCOLAX) packet [id number] No Take 17 g by mouth daily. [provider] Taking Active Self  sennosides-docusate sodium (SENOKOT-S) 8.6-50 MG tablet [id number] No Take 1 tablet by mouth daily.   [provider] Taking Active Self  vitamin C (ASCORBIC ACID) 500 MG tablet [id number] No Take 500 mg by mouth 2 (two) times daily.  [provider] Taking Active Self  Med List Note Annamaria Boots, Kasandra Knudsen, MD 08/10/11 2127): CPAP 18         Plan: . Will close [facility] pharmacy case as no further medication needs identified at this time.  I am happy to assist in the future as needed.     Regina Eck, PharmD, [place]  [phone]

## 2019-05-31 DIAGNOSIS — C61 Malignant neoplasm of prostate: Secondary | ICD-10-CM | POA: Diagnosis not present

## 2019-06-06 ENCOUNTER — Other Ambulatory Visit: Payer: Self-pay | Admitting: Cardiovascular Disease

## 2019-06-11 DIAGNOSIS — J449 Chronic obstructive pulmonary disease, unspecified: Secondary | ICD-10-CM | POA: Diagnosis not present

## 2019-06-11 DIAGNOSIS — R062 Wheezing: Secondary | ICD-10-CM | POA: Diagnosis not present

## 2019-06-11 DIAGNOSIS — J9622 Acute and chronic respiratory failure with hypercapnia: Secondary | ICD-10-CM | POA: Diagnosis not present

## 2019-06-11 DIAGNOSIS — G4733 Obstructive sleep apnea (adult) (pediatric): Secondary | ICD-10-CM | POA: Diagnosis not present

## 2019-06-12 DIAGNOSIS — C61 Malignant neoplasm of prostate: Secondary | ICD-10-CM | POA: Diagnosis not present

## 2019-06-14 DIAGNOSIS — H52223 Regular astigmatism, bilateral: Secondary | ICD-10-CM | POA: Diagnosis not present

## 2019-06-14 DIAGNOSIS — C61 Malignant neoplasm of prostate: Secondary | ICD-10-CM | POA: Diagnosis not present

## 2019-06-14 DIAGNOSIS — H25813 Combined forms of age-related cataract, bilateral: Secondary | ICD-10-CM | POA: Diagnosis not present

## 2019-06-14 DIAGNOSIS — H5203 Hypermetropia, bilateral: Secondary | ICD-10-CM | POA: Diagnosis not present

## 2019-06-14 DIAGNOSIS — H524 Presbyopia: Secondary | ICD-10-CM | POA: Diagnosis not present

## 2019-06-18 DIAGNOSIS — G4733 Obstructive sleep apnea (adult) (pediatric): Secondary | ICD-10-CM | POA: Diagnosis not present

## 2019-06-18 DIAGNOSIS — R062 Wheezing: Secondary | ICD-10-CM | POA: Diagnosis not present

## 2019-06-22 DIAGNOSIS — J449 Chronic obstructive pulmonary disease, unspecified: Secondary | ICD-10-CM | POA: Diagnosis not present

## 2019-06-22 DIAGNOSIS — J9622 Acute and chronic respiratory failure with hypercapnia: Secondary | ICD-10-CM | POA: Diagnosis not present

## 2019-06-22 DIAGNOSIS — R062 Wheezing: Secondary | ICD-10-CM | POA: Diagnosis not present

## 2019-06-22 DIAGNOSIS — G4733 Obstructive sleep apnea (adult) (pediatric): Secondary | ICD-10-CM | POA: Diagnosis not present

## 2019-06-27 DIAGNOSIS — Z5111 Encounter for antineoplastic chemotherapy: Secondary | ICD-10-CM | POA: Diagnosis not present

## 2019-06-27 DIAGNOSIS — C61 Malignant neoplasm of prostate: Secondary | ICD-10-CM | POA: Diagnosis not present

## 2019-07-09 ENCOUNTER — Encounter: Payer: Self-pay | Admitting: Internal Medicine

## 2019-07-09 ENCOUNTER — Ambulatory Visit (INDEPENDENT_AMBULATORY_CARE_PROVIDER_SITE_OTHER): Payer: PPO | Admitting: Internal Medicine

## 2019-07-09 ENCOUNTER — Other Ambulatory Visit: Payer: Self-pay

## 2019-07-09 DIAGNOSIS — C61 Malignant neoplasm of prostate: Secondary | ICD-10-CM

## 2019-07-09 DIAGNOSIS — G4733 Obstructive sleep apnea (adult) (pediatric): Secondary | ICD-10-CM

## 2019-07-09 NOTE — Patient Instructions (Signed)
We can continue BIPAP VPAP auto 12/12, PS4, O2 2L for sleep, mask of choice, humidifier, supplies, AirView/ card  You can check with [employer] as to when you might be eligible to replace your BIPAP machine.  Please call if we can help

## 2019-07-09 NOTE — Progress Notes (Signed)
HPI M former smoker followed for OSA, COPD, chronic hypoxic respiratory failure complicated by morbid obesity, OHS, aortic stenosis/ AVR,  Osteoarthritis, Anemia PFT 05/05/2015-severe obstructive airways disease, insignificant response to bronchodilator, severe restriction, moderate diffusion defect FVC 2.17/46%, FEV1 1.50/44%, ratio 0.69, TLC 64%, DLCO 57% with volume correction to 62% of predicted NPSG 06/21/85- AHI 98/hour with desaturation to 64% BiPAP titration study-21/17, PS 4 cwp O2 2L sleep, with residual AHI 52 "unknown" events, either obstructive or central and presumably mostly RERAs.  ------------------------------------------------------------  07/06/2018- 78 year old male former smoker followed for OSA, COPD mixed type, complicated by morbid obesity, aortic stenosis/aVR/ pacemaker, osteoarthritis, AVM colon, HBP, gout, Body weight today 348 pounds  VPAPauto 12/12, PS 4/ O2 2L sleep/Advanced Here with wife, reporting that he is doing well with no acute issues.  Now has pacemaker.  Had flu shot. Download 100% compliance AHI 0.5/hour.  He is comfortable with his pressures and mask and reports sleeping well in his recliner. He denies active cough or wheeze and says breathing is stable without problems recently. CXR 04/28/2018 IMPRESSION: 1. Limited visualization due to body habitus. 2. New pacer leads without pneumothorax or other complicating feature. The right ventricular lead is poorly visualized due to technical factors.  07/09/2019- 78 year old male former smoker followed for OSA, COPD mixed type, complicated by morbid obesity, aortic stenosis/aVR/ pacemaker, peripheral vascular disease, osteoarthritis, AVM colon, HBP, gout, Prostate cancer Body weight today 345 pounds  VPAPauto 12/12, PS 4/ O2 2L sleep/Advanced Download compliance 100%, AHI 0.4/ hr -----OSA on BIPAP 12/12, DME: [employer]; no complaints Denies acute issues, saying breathing is stable and he is very comfortable with  his VPAP.  ROS- see HPI  + = positive Constitutional:   No-   weight loss, night sweats, fevers, chills, +fatigue, lassitude. HEENT:   No-  headaches, difficulty swallowing, tooth/dental problems, sore throat,       No-  sneezing, itching, ear ache, nasal congestion, post nasal drip,  CV:  No-   chest pain, orthopnea, PND, swelling in lower extremities, anasarca,  dizziness, palpitations Resp: +shortness of breath with exertion or at rest.              No-   productive cough,  No non-productive cough,  No- coughing up of blood.              No-   change in color of mucus.  No- wheezing.   Skin: No-   rash or lesions. GI:  No-   heartburn, indigestion, abdominal pain, nausea, vomiting, GU:  MS:  No-   joint pain or swelling.   Neuro-     nothing unusual Psych:  No- change in mood or affect. No depression or anxiety.  No memory loss.  OBJ General- Alert, Oriented, Affect-appropriate, Distress- none acute. +Morbidly obese, +power wheelchair  Skin- rash-none, lesions- none, excoriation- none Lymphadenopathy- none Head- atraumatic            Eyes- Gross vision intact, PERRLA, conjunctivae clear secretions            Ears- Hearing, canals-normal            Nose- Clear, no-Septal dev, mucus, polyps, erosion, perforation             Throat- Mallampati III-IV , mucosa clear , drainage- none, tonsils- atrophic Neck- flexible , trachea midline, no stridor , thyroid nl, carotid no bruit Chest - symmetrical excursion , unlabored           Heart/CV- RRR ,  murmur-none, no gallop, no rub, nl s1 s2                           - JVD- none , edema+, stasis changes+, varices- none           Lung- clear to P&A, wheeze- none, cough- none , dullness-none, rub- none, no valve click heard                           Room air saturation while awake and sitting upright on arrival today 96%           Chest wall-+ pacemaker left  abd-  Br/ Gen/ Rectal- Not done, not indicated Extrem- cyanosis- none, clubbing,  none, atrophy- none, strength- nl Neuro- grossly intact to observation, alert and pleasant

## 2019-07-10 ENCOUNTER — Encounter: Payer: Self-pay | Admitting: Internal Medicine

## 2019-07-10 NOTE — Assessment & Plan Note (Signed)
Continues to benefit with good compliance and control on download Plan- Remain on VPAPauto 12/12, PS 4/ O2 2L sleep/Advanced

## 2019-07-10 NOTE — Assessment & Plan Note (Signed)
Now managed with hormone inj. He understands he is not a surgical candidate.

## 2019-07-10 NOTE — Assessment & Plan Note (Signed)
Not making lifestyle changes and very limited mobility so not exercising.

## 2019-07-11 DIAGNOSIS — G4733 Obstructive sleep apnea (adult) (pediatric): Secondary | ICD-10-CM | POA: Diagnosis not present

## 2019-07-11 DIAGNOSIS — J9622 Acute and chronic respiratory failure with hypercapnia: Secondary | ICD-10-CM | POA: Diagnosis not present

## 2019-07-11 DIAGNOSIS — J449 Chronic obstructive pulmonary disease, unspecified: Secondary | ICD-10-CM | POA: Diagnosis not present

## 2019-07-11 DIAGNOSIS — R062 Wheezing: Secondary | ICD-10-CM | POA: Diagnosis not present

## 2019-07-16 ENCOUNTER — Encounter: Payer: Self-pay | Admitting: Medical Oncology

## 2019-07-18 DIAGNOSIS — G4733 Obstructive sleep apnea (adult) (pediatric): Secondary | ICD-10-CM | POA: Diagnosis not present

## 2019-07-18 DIAGNOSIS — R062 Wheezing: Secondary | ICD-10-CM | POA: Diagnosis not present

## 2019-07-23 DIAGNOSIS — R062 Wheezing: Secondary | ICD-10-CM | POA: Diagnosis not present

## 2019-07-23 DIAGNOSIS — J9622 Acute and chronic respiratory failure with hypercapnia: Secondary | ICD-10-CM | POA: Diagnosis not present

## 2019-07-23 DIAGNOSIS — G4733 Obstructive sleep apnea (adult) (pediatric): Secondary | ICD-10-CM | POA: Diagnosis not present

## 2019-07-23 DIAGNOSIS — J449 Chronic obstructive pulmonary disease, unspecified: Secondary | ICD-10-CM | POA: Diagnosis not present

## 2019-07-26 DIAGNOSIS — G4733 Obstructive sleep apnea (adult) (pediatric): Secondary | ICD-10-CM | POA: Diagnosis not present

## 2019-07-26 DIAGNOSIS — J449 Chronic obstructive pulmonary disease, unspecified: Secondary | ICD-10-CM | POA: Diagnosis not present

## 2019-08-02 NOTE — Progress Notes (Signed)
Date:  08/07/2019   ID:  KENON DELASHMIT, DOB 12/15/1940, MRN [id number]  Provider Location: Office  PCP:  [name], MD  Cardiologist:  Jenkins Rouge, MD   Electrophysiologist:  Constance Haw, MD   Evaluation Performed:  Follow-Up Visit  Chief Complaint:  TAVR/ PAF   History of Present Illness:    [name] is a 78 y.o. male with a h/o morbid obesity, severe aortic stenosis, s/p TAVR in 2016, PPM for AV block, 04/27/18. She was referred to the afib clinic after device clinic noted afib x 2 days. He has a CHA2DS2VASc score of at least 4 . He has been on asa and Plavix since TAVR and states that he bleeds easily. He denies any awareness of afib, no alcohol, some caffeine. Wears cpap.   F/u Afib clinic 11/09/18. Patient underwent DCCV on 10/27/18 and developed acute hypoxia, likely multifactorial in setting of COPD and fluid overload. He was admitted to the hospital for diuresis. He has not missed any doses of his Eliquis. He has maintained NSR   Note he had no CAD at cath in 2016 prior to TAVR   Echo5/20/20  showed stable TAVR valve with moderate MS stable gradients EF 65% mean AV gradient 17 peak 28 mmhg   Diagnosed with prostate cancer seeing Dr Tammi Klippel and Louis Meckel  Doing hormone Rx instead of radiation    The patient does not have symptoms concerning for COVID-19 infection (fever, chills, cough, or new shortness of breath).    Past Medical History:  Diagnosis Date  . 1st degree AV block 11/09/2018   Noted on EKG   . Anemia   . Aortic stenosis, severe    S/p Edwards Sapien 3 Transcatheter Heart Valve (size 26 mm, model # [id number], serial # [id number])  . Arthritis   . Back pain   . Bursitis   . Cataract    left immature  . Cellulitis 10/12/2015  . CHF (congestive heart failure) ([place])   . Complication of anesthesia    Halucinations  . Constipation    takes Miralax daily as well as Senokot daily  . DDD (degenerative disc disease)   . Gout    takes  Allopurinol daily  . Heart valve disorder   . History of blood clots 1962   knee  . History of blood transfusion    no abnormal reaction noted  . History of shingles   . Hyperlipidemia    takes Atorvastatin daily  . Hypertension    takes Lisinopril daily  . Joint pain   . Low back pain 01/25/2017  . Morbid obesity ([place])   . Neck pain on left side 01/25/2017  . OSA (obstructive sleep apnea)   . Osteoarthritis   . Peripheral edema    takes Furosemide daily  . Peripheral neuropathy    takes Gabapentin daily  . Peroneal palsy    significant right foot drop  . Persistent atrial fibrillation ([place])   . Pneumonia 25+yrs ago   hx of  . Prostate cancer ([place])   . Prostate cancer ([place]) 02/13/2019  . RBBB 11/09/2018   Noted on EKG  . Shortness of breath   . Swelling of extremity   . Thrombocytopenia ([place])   . Urinary frequency   . Urinary urgency   . Valvular heart disease   . Venous stasis dermatitis    Past Surgical History:  Procedure Laterality Date  . CARDIAC CATHETERIZATION N/A 05/02/2015   Procedure: Right/Left Heart Cath  and Coronary Angiography;  Surgeon: Burnell Blanks, MD;  Location: [place] CV LAB;  Service: Cardiovascular;  Laterality: N/A;  . CARDIOVERSION N/A 10/27/2018   Procedure: CARDIOVERSION;  Surgeon: Fay Records, MD;  Location: [facility] ENDOSCOPY;  Service: Cardiovascular;  Laterality: N/A;  . COLONOSCOPY N/A 02/07/2013   Procedure: COLONOSCOPY;  Surgeon: Juanita Craver, MD;  Location: [facility] ENDOSCOPY;  Service: Endoscopy;  Laterality: N/A;  . COLONOSCOPY N/A 02/09/2013   Procedure: COLONOSCOPY;  Surgeon: Beryle Beams, MD;  Location: [place];  Service: Endoscopy;  Laterality: N/A;  . ESOPHAGOGASTRODUODENOSCOPY N/A 02/07/2013   Procedure: ESOPHAGOGASTRODUODENOSCOPY (EGD);  Surgeon: Juanita Craver, MD;  Location: [facility] ENDOSCOPY;  Service: Endoscopy;  Laterality: N/A;  . GIVENS CAPSULE STUDY N/A 02/09/2013   Procedure: GIVENS CAPSULE STUDY;  Surgeon: Beryle Beams,  MD;  Location: [place];  Service: Endoscopy;  Laterality: N/A;  . HERNIA REPAIR     umbilical hernia  . KNEE SURGERY Right    multiple knee surgeries due to complication of R TKA  . LAMINOTOMY  3825   c6-t2  . PACEMAKER IMPLANT N/A 04/27/2018   Procedure: PACEMAKER IMPLANT;  Surgeon: Constance Haw, MD;  Location: [place] CV LAB;  Service: Cardiovascular;  Laterality: N/A;  . PROSTATE BIOPSY N/A 04/12/2019   Procedure: BIOPSY TRANSRECTAL ULTRASONIC PROSTATE (TUBP) CYSTOSCOPY;  Surgeon: [name] Hughs, MD;  Location: [facility] ORS;  Service: Urology;  Laterality: N/A;  . SPINE SURGERY    . TEE WITHOUT CARDIOVERSION N/A 03/07/2015   Procedure: TRANSESOPHAGEAL ECHOCARDIOGRAM (TEE);  Surgeon: Josue Hector, MD;  Location: [facility] ENDOSCOPY;  Service: Cardiovascular;  Laterality: N/A;  ANES TO BRING PROPOFOL PER DOCTOR  . TEE WITHOUT CARDIOVERSION N/A 06/10/2015   Procedure: TRANSESOPHAGEAL ECHOCARDIOGRAM (TEE);  Surgeon: Burnell Blanks, MD;  Location: [place];  Service: Open Heart Surgery;  Laterality: N/A;  . TOTAL KNEE ARTHROPLASTY     right   x4 left x 1   . TRANSCATHETER AORTIC VALVE REPLACEMENT, TRANSFEMORAL Left 06/10/2015   Procedure: TRANSCATHETER AORTIC VALVE REPLACEMENT, TRANSFEMORAL;  Surgeon: Burnell Blanks, MD;  Location: [place];  Service: Open Heart Surgery;  Laterality: Left;     Current Meds  Medication Sig  . acetaminophen (TYLENOL) 500 MG tablet Take 1,000 mg by mouth 2 (two) times daily.   [name] atorvastatin (LIPITOR) 40 MG tablet Take 1 and a half tablet daily by mouth  (41m)  . Cholecalciferol (VITAMIN D3) 125 MCG (5000 UT) CAPS Take 5,000 Units by mouth daily.  . Coenzyme Q10 (COQ10) 100 MG CAPS Take 300 mg by mouth daily.   .Marland KitchenCRANBERRY PO Take 1 capsule by mouth 2 (two) times a day.  .Marland KitchenELIQUIS 5 MG TABS tablet TAKE 1 TABLET BY MOUTH TWICE A DAY  . Ferrous Fumarate-Folic Acid (HEMATINIC/FOLIC ACID) 3053-9MG TABS Take 1 tablet by mouth 2 (two) times a week.   . furosemide (LASIX) 20 MG tablet TAKE 1 TABLET BY MOUTH TWICE A DAY  . gabapentin (NEURONTIN) 300 MG capsule Take 1 capsule (300 mg total) by mouth 3 (three) times daily.  . Misc Natural Products (TART CHERRY ADVANCED PO) Take 1,200 mg by mouth 2 (two) times a day.   . Multiple Vitamins-Minerals (CENTRUM SILVER PO) Take 1 tablet by mouth daily.  . Phenazopyridine HCl (AZO-STANDARD PO) Take by mouth.  . polyethylene glycol (MIRALAX / GLYCOLAX) packet Take 17 g by mouth daily.  . sennosides-docusate sodium (SENOKOT-S) 8.6-50 MG tablet Take 1 tablet by mouth daily.   .[name]  vitamin C (ASCORBIC ACID) 500 MG tablet Take 500 mg by mouth 2 (two) times daily.      Allergies:   Coumadin [warfarin sodium]   Social History   Tobacco Use  . Smoking status: Former Smoker    Years: 30.00    Types: Pipe    Quit date: 09/20/2006    Years since quitting: 12.8  . Smokeless tobacco: Never Used  Substance Use Topics  . Alcohol use: No    Alcohol/week: 0.0 standard drinks  . Drug use: No     Family Hx: The patient's family history includes Arthritis in his sister and sister; Other in his father; Prostate cancer in his brother. There is no history of Breast cancer or Colon cancer.  ROS:   Please see the history of present illness.     All other systems reviewed and are negative.   Prior CV studies:   The following studies were reviewed today:  02/07/19    Labs/Other Tests and Data Reviewed:    EKG:  AV pacing   Recent Labs: 10/27/2018: B Natriuretic Peptide 125.9 03/19/2019: ALT 17; TSH 1.19 04/11/2019: BUN 30; Creatinine, Ser 1.00; Hemoglobin 12.3; Platelets 134; Potassium 4.7; Sodium 141   Recent Lipid Panel Lab Results  Component Value Date/Time   CHOL 117 03/19/2019 11:28 AM   CHOL 140 08/22/2018 09:51 AM   TRIG 135.0 03/19/2019 11:28 AM   HDL 29.50 (L) 03/19/2019 11:28 AM   HDL 30 (L) 08/22/2018 09:51 AM   CHOLHDL 4 03/19/2019 11:28 AM   LDLCALC 60 03/19/2019 11:28 AM   LDLCALC 85  08/22/2018 09:51 AM   LDLDIRECT 85.0 07/28/2017 04:28 PM    Wt Readings from Last 3 Encounters:  08/07/19 (!) 345 lb (156.5 kg)  07/09/19 (!) 345 lb (156.5 kg)  05/15/19 (!) 350 lb (158.8 kg)     Objective:    Vital Signs:  BP 118/72   Pulse 60   Ht _0  (1.88 m)   Wt (!) 345 lb (156.5 kg) Comment: pt told weight  SpO2 99%   BMI 44.30 kg/m    No exam on telephone visit   ASSESSMENT & PLAN:    PAF:  Post [facility] 10/27/18 appears to be maintaining NSR continue eliquis CHADVASC 4  TAVR:  26 mm Sapien 3 2016 April 27 2018 echo with EF 60-65% mean gradient 18 mmHg with no PVL noted 02/07/19  MVD:  Severe MAC with moderate functional MS mean gradient 13 mmHg MVA PT1/2 1.2 cm2 On lasix HR runs low with pacer back up consider adding beta blocker in future  HLD:  On statin labs with primary  PPM:  Post [employer] PPM dual chamber 04/27/18 normal function f/u [place]  HTN: Well controlled.  Continue current medications and low sodium Dash type diet.  Prostate:  Elevated PSA diagnosed with prostate cancer Getting hormone Rx f/u Dr Tammi Klippel    COVID-19 Education: The signs and symptoms of COVID-19 were discussed with the patient and how to seek care for testing (follow up with PCP or arrange E-visit).  The importance of social distancing was discussed today.  Time:   Today, I have spent 30 minutes with the patient  Medication Adjustments/Labs and Tests Ordered: Current medicines are reviewed at length with the patient today.  Concerns regarding medicines are outlined above.   Tests Ordered: No orders of the defined types were placed in this encounter.   Medication Changes: No orders of the defined types were placed in this encounter.  Disposition:  Follow up in 6 months echo May 2021   Signed, Jenkins Rouge, MD  08/07/2019 11:44 AM    [place]

## 2019-08-07 ENCOUNTER — Other Ambulatory Visit: Payer: Self-pay

## 2019-08-07 ENCOUNTER — Ambulatory Visit: Payer: PPO | Admitting: Cardiovascular Disease

## 2019-08-07 ENCOUNTER — Ambulatory Visit (INDEPENDENT_AMBULATORY_CARE_PROVIDER_SITE_OTHER): Payer: PPO | Admitting: *Deleted

## 2019-08-07 ENCOUNTER — Encounter: Payer: Self-pay | Admitting: Cardiovascular Disease

## 2019-08-07 VITALS — BP 118/72 | HR 60 | Ht 74.0 in | Wt 345.0 lb

## 2019-08-07 DIAGNOSIS — Z952 Presence of prosthetic heart valve: Secondary | ICD-10-CM | POA: Diagnosis not present

## 2019-08-07 DIAGNOSIS — I4819 Other persistent atrial fibrillation: Secondary | ICD-10-CM

## 2019-08-07 DIAGNOSIS — I441 Atrioventricular block, second degree: Secondary | ICD-10-CM

## 2019-08-07 LAB — CUP PACEART REMOTE DEVICE CHECK
Battery Remaining Longevity: 125 mo
Battery Remaining Percentage: 95.5 %
Battery Voltage: 3.01 V
Brady Statistic AP VP Percent: 58 %
Brady Statistic AP VS Percent: 1 %
Brady Statistic AS VP Percent: 22 %
Brady Statistic AS VS Percent: 19 %
Brady Statistic RA Percent Paced: 58 %
Brady Statistic RV Percent Paced: 81 %
Date Time Interrogation Session: 20201116070013
Implantable Lead Implant Date: 20190808
Implantable Lead Implant Date: 20190808
Implantable Lead Location: 753859
Implantable Lead Location: 753860
Implantable Pulse Generator Implant Date: 20190808
Lead Channel Impedance Value: 460 Ohm
Lead Channel Impedance Value: 490 Ohm
Lead Channel Pacing Threshold Amplitude: 0.625 V
Lead Channel Pacing Threshold Amplitude: 0.625 V
Lead Channel Pacing Threshold Pulse Width: 0.5 ms
Lead Channel Pacing Threshold Pulse Width: 0.5 ms
Lead Channel Sensing Intrinsic Amplitude: 1.2 mV
Lead Channel Sensing Intrinsic Amplitude: 12 mV
Lead Channel Setting Pacing Amplitude: 0.875
Lead Channel Setting Pacing Amplitude: 1.625
Lead Channel Setting Pacing Pulse Width: 0.5 ms
Lead Channel Setting Sensing Sensitivity: 2.5 mV
Pulse Gen Model: 2272
Pulse Gen Serial Number: 9052893

## 2019-08-07 NOTE — Patient Instructions (Signed)

## 2019-08-11 DIAGNOSIS — J9622 Acute and chronic respiratory failure with hypercapnia: Secondary | ICD-10-CM | POA: Diagnosis not present

## 2019-08-11 DIAGNOSIS — R062 Wheezing: Secondary | ICD-10-CM | POA: Diagnosis not present

## 2019-08-11 DIAGNOSIS — G4733 Obstructive sleep apnea (adult) (pediatric): Secondary | ICD-10-CM | POA: Diagnosis not present

## 2019-08-11 DIAGNOSIS — J449 Chronic obstructive pulmonary disease, unspecified: Secondary | ICD-10-CM | POA: Diagnosis not present

## 2019-08-18 DIAGNOSIS — G4733 Obstructive sleep apnea (adult) (pediatric): Secondary | ICD-10-CM | POA: Diagnosis not present

## 2019-08-18 DIAGNOSIS — R062 Wheezing: Secondary | ICD-10-CM | POA: Diagnosis not present

## 2019-08-22 DIAGNOSIS — J449 Chronic obstructive pulmonary disease, unspecified: Secondary | ICD-10-CM | POA: Diagnosis not present

## 2019-08-22 DIAGNOSIS — J9622 Acute and chronic respiratory failure with hypercapnia: Secondary | ICD-10-CM | POA: Diagnosis not present

## 2019-08-22 DIAGNOSIS — G4733 Obstructive sleep apnea (adult) (pediatric): Secondary | ICD-10-CM | POA: Diagnosis not present

## 2019-08-22 DIAGNOSIS — R062 Wheezing: Secondary | ICD-10-CM | POA: Diagnosis not present

## 2019-08-31 DIAGNOSIS — C61 Malignant neoplasm of prostate: Secondary | ICD-10-CM | POA: Diagnosis not present

## 2019-09-03 NOTE — Progress Notes (Signed)
Remote pacemaker transmission.   

## 2019-09-06 DIAGNOSIS — C61 Malignant neoplasm of prostate: Secondary | ICD-10-CM | POA: Diagnosis not present

## 2019-09-10 DIAGNOSIS — J9622 Acute and chronic respiratory failure with hypercapnia: Secondary | ICD-10-CM | POA: Diagnosis not present

## 2019-09-10 DIAGNOSIS — J449 Chronic obstructive pulmonary disease, unspecified: Secondary | ICD-10-CM | POA: Diagnosis not present

## 2019-09-10 DIAGNOSIS — G4733 Obstructive sleep apnea (adult) (pediatric): Secondary | ICD-10-CM | POA: Diagnosis not present

## 2019-09-10 DIAGNOSIS — R062 Wheezing: Secondary | ICD-10-CM | POA: Diagnosis not present

## 2019-10-01 LAB — CUP PACEART INCLINIC DEVICE CHECK
Date Time Interrogation Session: 20200901165900
Implantable Lead Implant Date: 20190808
Implantable Lead Implant Date: 20190808
Implantable Lead Location: 753859
Implantable Lead Location: 753860
Implantable Pulse Generator Implant Date: 20190808
Pulse Gen Model: 2272
Pulse Gen Serial Number: 9052893

## 2019-10-11 DIAGNOSIS — J9622 Acute and chronic respiratory failure with hypercapnia: Secondary | ICD-10-CM | POA: Diagnosis not present

## 2019-10-11 DIAGNOSIS — R062 Wheezing: Secondary | ICD-10-CM | POA: Diagnosis not present

## 2019-10-11 DIAGNOSIS — J449 Chronic obstructive pulmonary disease, unspecified: Secondary | ICD-10-CM | POA: Diagnosis not present

## 2019-10-11 DIAGNOSIS — G4733 Obstructive sleep apnea (adult) (pediatric): Secondary | ICD-10-CM | POA: Diagnosis not present

## 2019-10-18 DIAGNOSIS — R062 Wheezing: Secondary | ICD-10-CM | POA: Diagnosis not present

## 2019-10-18 DIAGNOSIS — G4733 Obstructive sleep apnea (adult) (pediatric): Secondary | ICD-10-CM | POA: Diagnosis not present

## 2019-10-23 DIAGNOSIS — J449 Chronic obstructive pulmonary disease, unspecified: Secondary | ICD-10-CM | POA: Diagnosis not present

## 2019-10-23 DIAGNOSIS — R062 Wheezing: Secondary | ICD-10-CM | POA: Diagnosis not present

## 2019-10-23 DIAGNOSIS — G4733 Obstructive sleep apnea (adult) (pediatric): Secondary | ICD-10-CM | POA: Diagnosis not present

## 2019-10-23 DIAGNOSIS — J9622 Acute and chronic respiratory failure with hypercapnia: Secondary | ICD-10-CM | POA: Diagnosis not present

## 2019-10-24 DIAGNOSIS — G4733 Obstructive sleep apnea (adult) (pediatric): Secondary | ICD-10-CM | POA: Diagnosis not present

## 2019-10-24 DIAGNOSIS — J449 Chronic obstructive pulmonary disease, unspecified: Secondary | ICD-10-CM | POA: Diagnosis not present

## 2019-10-25 ENCOUNTER — Ambulatory Visit (INDEPENDENT_AMBULATORY_CARE_PROVIDER_SITE_OTHER): Payer: Medicare HMO | Admitting: Family Medicine

## 2019-10-25 ENCOUNTER — Encounter: Payer: Self-pay | Admitting: Family Medicine

## 2019-10-25 ENCOUNTER — Other Ambulatory Visit: Payer: Self-pay

## 2019-10-25 DIAGNOSIS — I4819 Other persistent atrial fibrillation: Secondary | ICD-10-CM

## 2019-10-25 DIAGNOSIS — E7800 Pure hypercholesterolemia, unspecified: Secondary | ICD-10-CM | POA: Diagnosis not present

## 2019-10-25 DIAGNOSIS — R739 Hyperglycemia, unspecified: Secondary | ICD-10-CM

## 2019-10-25 DIAGNOSIS — N289 Disorder of kidney and ureter, unspecified: Secondary | ICD-10-CM | POA: Diagnosis not present

## 2019-10-25 DIAGNOSIS — I10 Essential (primary) hypertension: Secondary | ICD-10-CM | POA: Diagnosis not present

## 2019-10-29 NOTE — Assessment & Plan Note (Signed)
Hydrate and monitor 

## 2019-10-29 NOTE — Assessment & Plan Note (Signed)
Tolerating statin, encouraged heart healthy diet, avoid trans fats, minimize simple carbs and saturated fats. Increase exercise as tolerated 

## 2019-10-29 NOTE — Progress Notes (Signed)
Virtual Visit via phone Note  I connected with [name] on 10/29/19 at  3:20 PM EST by a phone enabled telemedicine application and verified that I am speaking with the correct person using two identifiers.  Location: Patient: home Provider: office   I discussed the limitations of evaluation and management by telemedicine and the availability of in person appointments. The patient expressed understanding and agreed to proceed. [name], CMA was able to get the patient set up on a visit, phone after being unable to set up video visit      Subjective:    Patient ID: [name], male    DOB: May 05, 1941, 79 y.o.   MRN: [id number]  No chief complaint on file.   HPI Patient is in today for follow up on chronic medical concerns. He and his wife are maintaining strict quarantine and he denies any recent febrile illness or hospitalizations. He is mostly feeling well. No acute concerns. Due to his back and weight he has become incresingly immobile but he is otherwise doing well. Denies CP/palp/SOB/HA/congestion/fevers/GI or GU c/o. Taking meds as prescribed  Past Medical History:  Diagnosis Date  . 1st degree AV block 11/09/2018   Noted on EKG   . Anemia   . Aortic stenosis, severe    S/p Edwards Sapien 3 Transcatheter Heart Valve (size 26 mm, model # [id number], serial # [id number])  . Arthritis   . Back pain   . Bursitis   . Cataract    left immature  . Cellulitis 10/12/2015  . CHF (congestive heart failure) ([place])   . Complication of anesthesia    Halucinations  . Constipation    takes Miralax daily as well as Senokot daily  . DDD (degenerative disc disease)   . Gout    takes Allopurinol daily  . Heart valve disorder   . History of blood clots 1962   knee  . History of blood transfusion    no abnormal reaction noted  . History of shingles   . Hyperlipidemia    takes Atorvastatin daily  . Hypertension    takes Lisinopril daily  . Joint pain   . Low back pain  01/25/2017  . Morbid obesity ([place])   . Neck pain on left side 01/25/2017  . OSA (obstructive sleep apnea)   . Osteoarthritis   . Peripheral edema    takes Furosemide daily  . Peripheral neuropathy    takes Gabapentin daily  . Peroneal palsy    significant right foot drop  . Persistent atrial fibrillation ([place])   . Pneumonia 25+yrs ago   hx of  . Prostate cancer ([place])   . Prostate cancer ([place]) 02/13/2019  . RBBB 11/09/2018   Noted on EKG  . Shortness of breath   . Swelling of extremity   . Thrombocytopenia ([place])   . Urinary frequency   . Urinary urgency   . Valvular heart disease   . Venous stasis dermatitis     Past Surgical History:  Procedure Laterality Date  . CARDIAC CATHETERIZATION N/A 05/02/2015   Procedure: Right/Left Heart Cath and Coronary Angiography;  Surgeon: Burnell Blanks, MD;  Location: [place] [facility];  Service: Cardiovascular;  Laterality: N/A;  . CARDIOVERSION N/A 10/27/2018   Procedure: CARDIOVERSION;  Surgeon: Fay Records, MD;  Location: [facility] ENDOSCOPY;  Service: Cardiovascular;  Laterality: N/A;  . COLONOSCOPY N/A 02/07/2013   Procedure: COLONOSCOPY;  Surgeon: Juanita Craver, MD;  Location: [facility] ENDOSCOPY;  Service: Endoscopy;  Laterality: N/A;  .  COLONOSCOPY N/A 02/09/2013   Procedure: COLONOSCOPY;  Surgeon: Beryle Beams, MD;  Location: [place];  Service: Endoscopy;  Laterality: N/A;  . ESOPHAGOGASTRODUODENOSCOPY N/A 02/07/2013   Procedure: ESOPHAGOGASTRODUODENOSCOPY (EGD);  Surgeon: Juanita Craver, MD;  Location: [facility] ENDOSCOPY;  Service: Endoscopy;  Laterality: N/A;  . GIVENS CAPSULE STUDY N/A 02/09/2013   Procedure: GIVENS CAPSULE STUDY;  Surgeon: Beryle Beams, MD;  Location: [place];  Service: Endoscopy;  Laterality: N/A;  . HERNIA REPAIR     umbilical hernia  . KNEE SURGERY Right    multiple knee surgeries due to complication of R TKA  . LAMINOTOMY  3295   c6-t2  . PACEMAKER IMPLANT N/A 04/27/2018   Procedure: PACEMAKER IMPLANT;  Surgeon:  Constance Haw, MD;  Location: [place] [facility];  Service: Cardiovascular;  Laterality: N/A;  . PROSTATE BIOPSY N/A 04/12/2019   Procedure: BIOPSY TRANSRECTAL ULTRASONIC PROSTATE (TUBP) CYSTOSCOPY;  Surgeon: [name] Hughs, MD;  Location: [facility];  Service: Urology;  Laterality: N/A;  . SPINE SURGERY    . TEE WITHOUT CARDIOVERSION N/A 03/07/2015   Procedure: TRANSESOPHAGEAL ECHOCARDIOGRAM (TEE);  Surgeon: Josue Hector, MD;  Location[facility] ENDOSCOPY;  Service: Cardiovascular;  Laterality: N/A;  ANES TO BRING PROPOFOL PER DOCTOR  . TEE WITHOUT CARDIOVERSION N/A 06/10/2015   Procedure: TRANSESOPHAGEAL ECHOCARDIOGRAM (TEE);  Surgeon: Burnell Blanks, MD;  Location: [place];  Service: Open Heart Surgery;  Laterality: N/A;  . TOTAL KNEE ARTHROPLASTY     right   x4 left x 1   . TRANSCATHETER AORTIC VALVE REPLACEMENT, TRANSFEMORAL Left 06/10/2015   Procedure: TRANSCATHETER AORTIC VALVE REPLACEMENT, TRANSFEMORAL;  Surgeon: Burnell Blanks, MD;  Location: [place];  Service: Open Heart Surgery;  Laterality: Left;    Family History  Problem Relation Age of Onset  . Other Father        POSSIBLE HEART ATTACK  . Arthritis Sister        "crippling"  . Prostate cancer Brother   . Arthritis Sister   . Breast cancer Neg Hx   . Colon cancer Neg Hx     Social History   Socioeconomic History  . Marital status: Married    Spouse name: [name]  . Number of children: 3  . Years of education: Not on file  . Highest education level: Not on file  Occupational History  . Occupation: retired Nurse, mental health  . Smoking status: Former Smoker    Years: 30.00    Types: Pipe    Quit date: 09/20/2006    Years since quitting: 13.1  . Smokeless tobacco: Never Used  Substance and Sexual Activity  . Alcohol use: No    Alcohol/week: 0.0 standard drinks  . Drug use: No  . Sexual activity: Not Currently    Comment: lives with wife, retired, no dietary restrictions  Other Topics Concern   . Not on file  Social History Narrative  . Not on file   Social Determinants of Health   Financial Resource Strain:   . Difficulty of Paying Living Expenses: Not on file  Food Insecurity:   . Worried About Charity fundraiser in the Last Year: Not on file  . Ran Out of Food in the Last Year: Not on file  Transportation Needs:   . Lack of Transportation (Medical): Not on file  . Lack of Transportation (Non-Medical): Not on file  Physical Activity:   . Days of Exercise per Week: Not on file  . Minutes  of Exercise per Session: Not on file  Stress:   . Feeling of Stress : Not on file  Social Connections:   . Frequency of Communication with Friends and Family: Not on file  . Frequency of Social Gatherings with Friends and Family: Not on file  . Attends Religious Services: Not on file  . Active Member of Clubs or Organizations: Not on file  . Attends Archivist Meetings: Not on file  . Marital Status: Not on file  Intimate Partner Violence:   . Fear of Current or Ex-Partner: Not on file  . Emotionally Abused: Not on file  . Physically Abused: Not on file  . Sexually Abused: Not on file    Outpatient Medications Prior to Visit  Medication Sig Dispense Refill  . acetaminophen (TYLENOL) 500 MG tablet Take 1,000 mg by mouth 2 (two) times daily.     [name] atorvastatin (LIPITOR) 40 MG tablet Take 1 and a half tablet daily by mouth  (60mg ) 135 tablet 5  . Cholecalciferol (VITAMIN D3) 125 MCG (5000 UT) CAPS Take 5,000 Units by mouth daily.    . Coenzyme Q10 (COQ10) 100 MG CAPS Take 300 mg by mouth daily.     [name] CRANBERRY PO Take 1 capsule by mouth 2 (two) times a day.    [name] ELIQUIS 5 MG TABS tablet TAKE 1 TABLET BY MOUTH TWICE A DAY 60 tablet 6  . Ferrous Fumarate-Folic Acid (HEMATINIC/FOLIC ACID) 761-6 MG TABS Take 1 tablet by mouth 2 (two) times a week. 90 each 1  . furosemide (LASIX) 20 MG tablet TAKE 1 TABLET BY MOUTH TWICE A DAY 180 tablet 2  . gabapentin (NEURONTIN) 300 MG  capsule Take 1 capsule (300 mg total) by mouth 3 (three) times daily. 270 capsule 2  . Misc Natural Products (TART CHERRY ADVANCED PO) Take 1,200 mg by mouth 2 (two) times a day.     . Multiple Vitamins-Minerals (CENTRUM SILVER PO) Take 1 tablet by mouth daily.    . Phenazopyridine HCl (AZO-STANDARD PO) Take by mouth.    . polyethylene glycol (MIRALAX / GLYCOLAX) packet Take 17 g by mouth daily.    . sennosides-docusate sodium (SENOKOT-S) 8.6-50 MG tablet Take 1 tablet by mouth daily.     . vitamin C (ASCORBIC ACID) 500 MG tablet Take 500 mg by mouth 2 (two) times daily.      No facility-administered medications prior to visit.    Allergies  Allergen Reactions  . Coumadin [Warfarin Sodium] Other (See Comments)    States he can't be on this-bleeds out    Review of Systems  Constitutional: Negative for fever and malaise/fatigue.  HENT: Negative for congestion.   Eyes: Negative for blurred vision.  Respiratory: Negative for shortness of breath.   Cardiovascular: Positive for leg swelling. Negative for chest pain and palpitations.  Gastrointestinal: Negative for abdominal pain, blood in stool and nausea.  Genitourinary: Negative for dysuria and frequency.  Musculoskeletal: Positive for back pain. Negative for falls.  Skin: Negative for rash.  Neurological: Negative for dizziness, loss of consciousness and headaches.  Endo/Heme/Allergies: Negative for environmental allergies.  Psychiatric/Behavioral: Negative for depression. The patient is not nervous/anxious.        Objective:    Physical Exam unable to obtain via phone  BP (!) 111/57 (BP Location: Left Arm, Patient Position: Sitting, Cuff Size: Large)   Pulse 62   Temp 97.6 F (36.4 C) (Oral)   Wt (!) 340 lb (154.2 kg)   BMI 43.65  kg/m  Wt Readings from Last 3 Encounters:  10/25/19 (!) 340 lb (154.2 kg)  08/07/19 (!) 345 lb (156.5 kg)  07/09/19 (!) 345 lb (156.5 kg)    Diabetic Foot Exam - Simple   No data filed      Lab Results  Component Value Date   WBC 7.1 04/11/2019   HGB 12.3 (L) 04/11/2019   HCT 40.6 04/11/2019   PLT 134 (L) 04/11/2019   GLUCOSE 98 04/11/2019   CHOL 117 03/19/2019   TRIG 135.0 03/19/2019   HDL 29.50 (L) 03/19/2019   LDLDIRECT 85.0 07/28/2017   LDLCALC 60 03/19/2019   ALT 17 03/19/2019   AST 18 03/19/2019   NA 141 04/11/2019   K 4.7 04/11/2019   CL 98 04/11/2019   CREATININE 1.00 04/11/2019   BUN 30 (H) 04/11/2019   CO2 32 04/11/2019   TSH 1.19 03/19/2019   PSA 24.63 (H) 03/19/2019   INR 1.32 06/10/2015   HGBA1C 5.5 03/19/2019    Lab Results  Component Value Date   TSH 1.19 03/19/2019   Lab Results  Component Value Date   WBC 7.1 04/11/2019   HGB 12.3 (L) 04/11/2019   HCT 40.6 04/11/2019   MCV 96.4 04/11/2019   PLT 134 (L) 04/11/2019   Lab Results  Component Value Date   NA 141 04/11/2019   K 4.7 04/11/2019   CO2 32 04/11/2019   GLUCOSE 98 04/11/2019   BUN 30 (H) 04/11/2019   CREATININE 1.00 04/11/2019   BILITOT 0.3 03/19/2019   ALKPHOS 76 03/19/2019   AST 18 03/19/2019   ALT 17 03/19/2019   PROT 6.1 03/19/2019   ALBUMIN 3.7 03/19/2019   CALCIUM 9.1 04/11/2019   ANIONGAP 11 04/11/2019   GFR 71.70 03/19/2019   Lab Results  Component Value Date   CHOL 117 03/19/2019   Lab Results  Component Value Date   HDL 29.50 (L) 03/19/2019   Lab Results  Component Value Date   LDLCALC 60 03/19/2019   Lab Results  Component Value Date   TRIG 135.0 03/19/2019   Lab Results  Component Value Date   CHOLHDL 4 03/19/2019   Lab Results  Component Value Date   HGBA1C 5.5 03/19/2019       Assessment & Plan:   Problem List Items Addressed This Visit    HYPERCHOLESTEROLEMIA    Tolerating statin, encouraged heart healthy diet, avoid trans fats, minimize simple carbs and saturated fats. Increase exercise as tolerated      Hyperglycemia    hgba1c acceptable, minimize simple carbs. Increase exercise as tolerated.       Essential  hypertension    Monitor and report concerns, no changes to meds. Encouraged heart healthy diet such as the DASH diet and exercise as tolerated.       Renal insufficiency    Hydrate and monitor      Persistent atrial fibrillation ([place])    Tolerating eliquis         I am having [name] maintain his sennosides-docusate sodium, acetaminophen, CoQ10, polyethylene glycol, Misc Natural Products (TART CHERRY ADVANCED PO), Ferrous Fumarate-Folic Acid, vitamin C, Multiple Vitamins-Minerals (CENTRUM SILVER PO), Vitamin D3, CRANBERRY PO, Eliquis, Phenazopyridine HCl (AZO-STANDARD PO), atorvastatin, gabapentin, and furosemide.  No orders of the defined types were placed in this encounter.  I discussed the assessment and treatment plan with the patient. The patient was provided an opportunity to ask questions and all were answered. The patient agreed with the plan and demonstrated an understanding  of the instructions.   The patient was advised to call back or seek an in-person evaluation if the symptoms worsen or if the condition fails to improve as anticipated.  I provided 25 minutes of non-face-to-face time during this encounter.   Penni Homans, MD

## 2019-10-29 NOTE — Assessment & Plan Note (Signed)
Tolerating eliquis

## 2019-10-29 NOTE — Assessment & Plan Note (Signed)
hgba1c acceptable, minimize simple carbs. Increase exercise as tolerated.  

## 2019-10-29 NOTE — Assessment & Plan Note (Signed)
Monitor and report concerns, no changes to meds. Encouraged heart healthy diet such as the DASH diet and exercise as tolerated.  

## 2019-11-06 ENCOUNTER — Ambulatory Visit (INDEPENDENT_AMBULATORY_CARE_PROVIDER_SITE_OTHER): Payer: Medicare HMO | Admitting: *Deleted

## 2019-11-06 DIAGNOSIS — I4819 Other persistent atrial fibrillation: Secondary | ICD-10-CM | POA: Diagnosis not present

## 2019-11-06 LAB — CUP PACEART REMOTE DEVICE CHECK
Battery Remaining Longevity: 126 mo
Battery Remaining Percentage: 95.5 %
Battery Voltage: 3.01 V
Brady Statistic AP VP Percent: 54 %
Brady Statistic AP VS Percent: 1 %
Brady Statistic AS VP Percent: 30 %
Brady Statistic AS VS Percent: 16 %
Brady Statistic RA Percent Paced: 54 %
Brady Statistic RV Percent Paced: 83 %
Date Time Interrogation Session: 20210215020012
Implantable Lead Implant Date: 20190808
Implantable Lead Implant Date: 20190808
Implantable Lead Location: 753859
Implantable Lead Location: 753860
Implantable Pulse Generator Implant Date: 20190808
Lead Channel Impedance Value: 410 Ohm
Lead Channel Impedance Value: 510 Ohm
Lead Channel Pacing Threshold Amplitude: 0.5 V
Lead Channel Pacing Threshold Amplitude: 0.625 V
Lead Channel Pacing Threshold Pulse Width: 0.5 ms
Lead Channel Pacing Threshold Pulse Width: 0.5 ms
Lead Channel Sensing Intrinsic Amplitude: 12 mV
Lead Channel Sensing Intrinsic Amplitude: 2.8 mV
Lead Channel Setting Pacing Amplitude: 0.875
Lead Channel Setting Pacing Amplitude: 1.5 V
Lead Channel Setting Pacing Pulse Width: 0.5 ms
Lead Channel Setting Sensing Sensitivity: 2.5 mV
Pulse Gen Model: 2272
Pulse Gen Serial Number: 9052893

## 2019-11-07 NOTE — Progress Notes (Signed)
PPM Remote  

## 2019-11-09 ENCOUNTER — Telehealth: Payer: Self-pay | Admitting: Family Medicine

## 2019-11-09 NOTE — Telephone Encounter (Signed)
PT states that he was suppose to call in for a lab appt. No Lab Order in system.  I did make lab apt for  March 2nd. Please  If possible have order in by then For him and his wife [name] . Thanks

## 2019-11-11 DIAGNOSIS — G4733 Obstructive sleep apnea (adult) (pediatric): Secondary | ICD-10-CM | POA: Diagnosis not present

## 2019-11-11 DIAGNOSIS — J449 Chronic obstructive pulmonary disease, unspecified: Secondary | ICD-10-CM | POA: Diagnosis not present

## 2019-11-11 DIAGNOSIS — J9622 Acute and chronic respiratory failure with hypercapnia: Secondary | ICD-10-CM | POA: Diagnosis not present

## 2019-11-11 DIAGNOSIS — R062 Wheezing: Secondary | ICD-10-CM | POA: Diagnosis not present

## 2019-11-12 ENCOUNTER — Other Ambulatory Visit: Payer: Self-pay | Admitting: Family Medicine

## 2019-11-12 DIAGNOSIS — M1A9XX0 Chronic gout, unspecified, without tophus (tophi): Secondary | ICD-10-CM

## 2019-11-12 DIAGNOSIS — E7800 Pure hypercholesterolemia, unspecified: Secondary | ICD-10-CM

## 2019-11-12 DIAGNOSIS — Z0000 Encounter for general adult medical examination without abnormal findings: Secondary | ICD-10-CM

## 2019-11-12 DIAGNOSIS — D696 Thrombocytopenia, unspecified: Secondary | ICD-10-CM

## 2019-11-12 DIAGNOSIS — I10 Essential (primary) hypertension: Secondary | ICD-10-CM

## 2019-11-12 DIAGNOSIS — R739 Hyperglycemia, unspecified: Secondary | ICD-10-CM

## 2019-11-12 NOTE — Telephone Encounter (Signed)
I ordered labs for patient and wife just confirm they have appt.

## 2019-11-12 NOTE — Telephone Encounter (Signed)
Advise on labs to order. No future orders

## 2019-11-12 NOTE — Telephone Encounter (Signed)
Patient has appt 11/20/19 at 10:30 as well as his wife. Have called both home/cell number let them know appts confirmed//labs ordered on both.

## 2019-11-15 ENCOUNTER — Telehealth: Payer: Self-pay | Admitting: Family Medicine

## 2019-11-15 NOTE — Telephone Encounter (Signed)
Unable to completed PA via Covermymeds. Called [employer] [employer] at [phone] to initiate- case number: [id number]. Awaiting determination.

## 2019-11-15 NOTE — Telephone Encounter (Signed)
Patient states that his insurance will not pay for is medication:  latorvastatin (LIPITOR) 40 MG tablet    Patient  Is requesting that MD call insurance to fix problem.

## 2019-11-16 NOTE — Telephone Encounter (Signed)
PA approved. Effective 09/21/2019 to 09/19/2020. 

## 2019-11-18 DIAGNOSIS — G4733 Obstructive sleep apnea (adult) (pediatric): Secondary | ICD-10-CM | POA: Diagnosis not present

## 2019-11-18 DIAGNOSIS — R062 Wheezing: Secondary | ICD-10-CM | POA: Diagnosis not present

## 2019-11-20 ENCOUNTER — Other Ambulatory Visit: Payer: Self-pay

## 2019-11-20 ENCOUNTER — Other Ambulatory Visit (INDEPENDENT_AMBULATORY_CARE_PROVIDER_SITE_OTHER): Payer: Medicare HMO

## 2019-11-20 DIAGNOSIS — R062 Wheezing: Secondary | ICD-10-CM | POA: Diagnosis not present

## 2019-11-20 DIAGNOSIS — Z0000 Encounter for general adult medical examination without abnormal findings: Secondary | ICD-10-CM

## 2019-11-20 DIAGNOSIS — R739 Hyperglycemia, unspecified: Secondary | ICD-10-CM | POA: Diagnosis not present

## 2019-11-20 DIAGNOSIS — J9622 Acute and chronic respiratory failure with hypercapnia: Secondary | ICD-10-CM | POA: Diagnosis not present

## 2019-11-20 DIAGNOSIS — J449 Chronic obstructive pulmonary disease, unspecified: Secondary | ICD-10-CM | POA: Diagnosis not present

## 2019-11-20 DIAGNOSIS — M1A9XX0 Chronic gout, unspecified, without tophus (tophi): Secondary | ICD-10-CM

## 2019-11-20 DIAGNOSIS — I10 Essential (primary) hypertension: Secondary | ICD-10-CM

## 2019-11-20 DIAGNOSIS — G4733 Obstructive sleep apnea (adult) (pediatric): Secondary | ICD-10-CM | POA: Diagnosis not present

## 2019-11-20 LAB — COMPREHENSIVE METABOLIC PANEL
ALT: 19 U/L (ref 0–53)
AST: 21 U/L (ref 0–37)
Albumin: 3.5 g/dL (ref 3.5–5.2)
Alkaline Phosphatase: 79 U/L (ref 39–117)
BUN: 26 mg/dL — ABNORMAL HIGH (ref 6–23)
CO2: 37 mEq/L — ABNORMAL HIGH (ref 19–32)
Calcium: 9.5 mg/dL (ref 8.4–10.5)
Chloride: 98 mEq/L (ref 96–112)
Creatinine, Ser: 0.99 mg/dL (ref 0.40–1.50)
GFR: 73.24 mL/min (ref >60.00)
Glucose, Bld: 90 mg/dL (ref 70–99)
Potassium: 4.1 mEq/L (ref 3.5–5.1)
Sodium: 142 mEq/L (ref 135–145)
Total Bilirubin: 0.4 mg/dL (ref 0.2–1.2)
Total Protein: 6 g/dL (ref 6.0–8.3)

## 2019-11-20 LAB — URIC ACID: Uric Acid, Serum: 7.9 mg/dL — ABNORMAL HIGH (ref 4.0–7.8)

## 2019-11-20 LAB — CBC WITH DIFFERENTIAL/PLATELET
Basophils Absolute: 0 10*3/uL (ref 0.0–0.1)
Basophils Relative: 0.5 % (ref 0.0–3.0)
Eosinophils Absolute: 0.1 10*3/uL (ref 0.0–0.7)
Eosinophils Relative: 1.8 % (ref 0.0–5.0)
HCT: 36.8 % — ABNORMAL LOW (ref 39.0–52.0)
Hemoglobin: 12 g/dL — ABNORMAL LOW (ref 13.0–17.0)
Lymphocytes Relative: 22.8 % (ref 12.0–46.0)
Lymphs Abs: 1.4 10*3/uL (ref 0.7–4.0)
MCHC: 32.8 g/dL (ref 30.0–36.0)
MCV: 98.3 fl (ref 78.0–100.0)
Monocytes Absolute: 0.4 10*3/uL (ref 0.1–1.0)
Monocytes Relative: 6.6 % (ref 3.0–12.0)
Neutro Abs: 4.1 10*3/uL (ref 1.4–7.7)
Neutrophils Relative %: 68.3 % (ref 43.0–77.0)
Platelets: 118 10*3/uL — ABNORMAL LOW (ref 150.0–400.0)
RBC: 3.74 Mil/uL — ABNORMAL LOW (ref 4.22–5.81)
RDW: 16.3 % — ABNORMAL HIGH (ref 11.5–15.5)
WBC: 6 10*3/uL (ref 4.0–10.5)

## 2019-11-20 LAB — HEMOGLOBIN A1C: Hgb A1c MFr Bld: 5.6 % (ref 4.6–6.5)

## 2019-11-20 LAB — LIPID PANEL
Cholesterol: 137 mg/dL (ref 0–200)
HDL: 30.8 mg/dL — ABNORMAL LOW (ref >39.00)
LDL Cholesterol: 72 mg/dL (ref 0–99)
NonHDL: 105.72
Total CHOL/HDL Ratio: 4
Triglycerides: 167 mg/dL — ABNORMAL HIGH (ref 0.0–149.0)
VLDL: 33.4 mg/dL (ref 0.0–40.0)

## 2019-11-20 LAB — TSH: TSH: 1.35 u[IU]/mL (ref 0.35–4.50)

## 2019-11-21 ENCOUNTER — Other Ambulatory Visit (INDEPENDENT_AMBULATORY_CARE_PROVIDER_SITE_OTHER): Payer: Medicare HMO

## 2019-11-21 DIAGNOSIS — D539 Nutritional anemia, unspecified: Secondary | ICD-10-CM | POA: Diagnosis not present

## 2019-11-21 LAB — VITAMIN B12: Vitamin B-12: 377 pg/mL (ref 211–911)

## 2019-11-21 MED ORDER — ALLOPURINOL 100 MG PO TABS: 100.0000 mg | ORAL_TABLET | Freq: Every day | ORAL | 3 refills | Status: DC

## 2019-11-21 NOTE — Addendum Note (Signed)
Addended by: Gerilyn Nestle on: 11/21/2019 02:08 PM   Modules accepted: Orders

## 2019-12-07 ENCOUNTER — Encounter: Payer: Self-pay | Admitting: Podiatry

## 2019-12-07 ENCOUNTER — Ambulatory Visit (INDEPENDENT_AMBULATORY_CARE_PROVIDER_SITE_OTHER): Payer: Medicare HMO | Admitting: Podiatry

## 2019-12-07 ENCOUNTER — Other Ambulatory Visit: Payer: Self-pay

## 2019-12-07 VITALS — Temp 96.5°F

## 2019-12-07 DIAGNOSIS — M79674 Pain in right toe(s): Secondary | ICD-10-CM

## 2019-12-07 DIAGNOSIS — M79675 Pain in left toe(s): Secondary | ICD-10-CM | POA: Diagnosis not present

## 2019-12-07 DIAGNOSIS — D689 Coagulation defect, unspecified: Secondary | ICD-10-CM

## 2019-12-07 DIAGNOSIS — B351 Tinea unguium: Secondary | ICD-10-CM

## 2019-12-07 NOTE — Progress Notes (Signed)
This patient returns to my office for at risk foot care.  This patient requires this care by a professional since this patient will be at risk due to having PVD, thrombocytopenia venous stasis.  Patient is taking eliquiss.  This patient is unable to cut nails himself since the patient cannot reach his nails.These nails are painful walking and wearing shoes.  This patient presents for at risk foot care today.  Patient is in a wheelchair.  General Appearance  Alert, conversant and in no acute stress.  Vascular  Dorsalis pedis and posterior tibial  pulses are not  palpable  Bilaterally due to severe selling.  .  Capillary return is within normal limits  bilaterally. Temperature is within normal limits  Bilaterally.  Purplish toes  B/L.    Neurologic  Senn-Weinstein monofilament wire test diminished  bilaterally. Muscle power within normal limits bilaterally.  Nails Thick disfigured discolored nails with subungual debris  from hallux to fifth toes bilaterally. No evidence of bacterial infection or drainage bilaterally.  Orthopedic  No limitations of motion  feet .  No crepitus or effusions noted.  No bony pathology or digital deformities noted.  Skin  normotropic skin with no porokeratosis noted bilaterally.  No signs of infections or ulcers noted.     Onychomycosis  Pain in right toes  Pain in left toes  Consent was obtained for treatment procedures.   Mechanical debridement of nails 1-5  bilaterally performed with a nail nipper.  Filed with dremel without incident. No infection or ulcer.     Return office visit   3 months                   Told patient to return for periodic foot care and evaluation due to potential at risk complications.   Gardiner Barefoot DPM

## 2019-12-09 DIAGNOSIS — R062 Wheezing: Secondary | ICD-10-CM | POA: Diagnosis not present

## 2019-12-09 DIAGNOSIS — G4733 Obstructive sleep apnea (adult) (pediatric): Secondary | ICD-10-CM | POA: Diagnosis not present

## 2019-12-09 DIAGNOSIS — J9622 Acute and chronic respiratory failure with hypercapnia: Secondary | ICD-10-CM | POA: Diagnosis not present

## 2019-12-09 DIAGNOSIS — J449 Chronic obstructive pulmonary disease, unspecified: Secondary | ICD-10-CM | POA: Diagnosis not present

## 2019-12-11 ENCOUNTER — Telehealth: Payer: Self-pay | Admitting: Family Medicine

## 2019-12-11 NOTE — Telephone Encounter (Signed)
Caller : [name], [name] Call Back # [phone]   atorvastatin (LIPITOR) 40 MG tablet [[id number]  Prescription  is not cover by insurance in the formality .  Please advise

## 2019-12-11 NOTE — Telephone Encounter (Signed)
Patient stated that pharmacy filled the medication.  So no problem.

## 2019-12-16 DIAGNOSIS — R062 Wheezing: Secondary | ICD-10-CM | POA: Diagnosis not present

## 2019-12-16 DIAGNOSIS — G4733 Obstructive sleep apnea (adult) (pediatric): Secondary | ICD-10-CM | POA: Diagnosis not present

## 2019-12-17 ENCOUNTER — Other Ambulatory Visit (HOSPITAL_COMMUNITY): Payer: Self-pay | Admitting: Nurse Practitioner

## 2019-12-17 NOTE — Telephone Encounter (Signed)
Age 79, weight 154kg, SCr 0.99 on 11/20/19, last OV Noc 2020, afib indication

## 2019-12-21 DIAGNOSIS — J9622 Acute and chronic respiratory failure with hypercapnia: Secondary | ICD-10-CM | POA: Diagnosis not present

## 2019-12-21 DIAGNOSIS — R062 Wheezing: Secondary | ICD-10-CM | POA: Diagnosis not present

## 2019-12-21 DIAGNOSIS — J449 Chronic obstructive pulmonary disease, unspecified: Secondary | ICD-10-CM | POA: Diagnosis not present

## 2019-12-21 DIAGNOSIS — G4733 Obstructive sleep apnea (adult) (pediatric): Secondary | ICD-10-CM | POA: Diagnosis not present

## 2019-12-31 ENCOUNTER — Ambulatory Visit (INDEPENDENT_AMBULATORY_CARE_PROVIDER_SITE_OTHER): Payer: Medicare HMO | Admitting: Medical

## 2019-12-31 VITALS — BP 121/58 | HR 67

## 2019-12-31 DIAGNOSIS — J301 Allergic rhinitis due to pollen: Secondary | ICD-10-CM

## 2019-12-31 MED ORDER — MONTELUKAST SODIUM 10 MG PO TABS: 10.0000 mg | ORAL_TABLET | Freq: Every day | ORAL | 3 refills | Status: DC

## 2019-12-31 MED ORDER — ALBUTEROL SULFATE HFA 108 (90 BASE) MCG/ACT IN AERS: 2.0000 | INHALATION_SPRAY | Freq: Four times a day (QID) | RESPIRATORY_TRACT | 0 refills | Status: DC | PRN

## 2019-12-31 MED ORDER — LEVOCETIRIZINE DIHYDROCHLORIDE 5 MG PO TABS: 5.0000 mg | ORAL_TABLET | Freq: Every evening | ORAL | 3 refills | Status: DC

## 2019-12-31 NOTE — Progress Notes (Signed)
   Subjective:    Patient ID: [name], male    DOB: [birth year], 79 y.o.   MRN: [id number]  HPI  Virtual Visit via Telephone Note  I connected with [name] on 12/31/19 at 11:00 AM EDT by telephone and verified that I am speaking with the correct person using two identifiers.  Location: Patient: home Provider: office   I discussed the limitations, risks, security and privacy concerns of performing an evaluation and management service by telephone and the availability of in person appointments. I also discussed with the patient that there may be a patient responsible charge related to this service. The patient expressed understanding and agreed to proceed.   History of Present Illness:   Pt has very severe runny nose, nasal congestion pnd, and dry cough. No sneezing. No fever, no chills or sweats. When blows nose mild sinus pain.   States on phone runny/watery nose. This time of year and during the year will get allergy signs and symptoms.  Pt states he does not like to use nasal sprays.  Cough is only occasional. He is able to sleep.   Symptom onset with drop of pollen. Came on about one week.  Pt has slight chest congestion feeling when he takes deep breath. No wheezing.   No leg swelling. Last year echo was good. No chest pain.  Pt had covid vaccine already.   Observations/Objective:  General- no acute distress. Pleasant, alert and oriented.   Assessment and Plan: By your description of the onset of symptoms and timing around the time when pollen drop, I do think you have likely allergic rhinitis.  Will prescribe Xyzal  and and montelukast.  Can start with Xyzal and add montelukast if needed.  You mention very minimal slight tight airway sensation when you attempt to take a deep breath.  No leg swelling and no popliteal pain described/reported.  This might represent mild reactive airways associated with allergies.  On review of chart you have been on inhalers in  the past.  Will make albuterol to use if necessary for shortness of breath or wheezing.  If you get chest congestion, productive cough or worsening respiratory symptoms  Let me know.  If any severe symptoms then recommend ED evaluation.  Follow-up in 7 to 10 days or as needed.  Follow Up Instructions:    I discussed the assessment and treatment plan with the patient. The patient was provided an opportunity to ask questions and all were answered. The patient agreed with the plan and demonstrated an understanding of the instructions.   The patient was advised to call back or seek an in-person evaluation if the symptoms worsen or if the condition fails to improve as anticipated.  I provided 20 minutes of non-face-to-face time during this encounter.   Mackie Pai, PA-C   Review of Systems     Objective:   Physical Exam        Assessment & Plan:

## 2019-12-31 NOTE — Patient Instructions (Signed)
By your description of the onset of symptoms and timing around the time when pollen drop, I do think you have likely allergic rhinitis.  Will prescribe Xyzal  and and montelukast.  Can start with Xyzal and add montelukast if needed.  You mention very minimal slight tight airway sensation when you attempt to take a deep breath.  No leg swelling and no popliteal pain described/reported.  This might represent mild reactive airways associated with allergies.  On review of chart you have been on inhalers in the past.  Will make albuterol to use if necessary for shortness of breath or wheezing.  If you get chest congestion, productive cough or worsening respiratory symptoms  Let me know.  If any severe symptoms then recommend ED evaluation.  Follow-up in 7 to 10 days or as needed.

## 2020-01-01 ENCOUNTER — Other Ambulatory Visit: Payer: Self-pay | Admitting: Family Medicine

## 2020-01-03 DIAGNOSIS — C61 Malignant neoplasm of prostate: Secondary | ICD-10-CM | POA: Diagnosis not present

## 2020-01-09 DIAGNOSIS — G4733 Obstructive sleep apnea (adult) (pediatric): Secondary | ICD-10-CM | POA: Diagnosis not present

## 2020-01-09 DIAGNOSIS — J9622 Acute and chronic respiratory failure with hypercapnia: Secondary | ICD-10-CM | POA: Diagnosis not present

## 2020-01-09 DIAGNOSIS — J449 Chronic obstructive pulmonary disease, unspecified: Secondary | ICD-10-CM | POA: Diagnosis not present

## 2020-01-09 DIAGNOSIS — R062 Wheezing: Secondary | ICD-10-CM | POA: Diagnosis not present

## 2020-01-10 DIAGNOSIS — M8580 Other specified disorders of bone density and structure, unspecified site: Secondary | ICD-10-CM | POA: Diagnosis not present

## 2020-01-10 DIAGNOSIS — C61 Malignant neoplasm of prostate: Secondary | ICD-10-CM | POA: Diagnosis not present

## 2020-01-16 DIAGNOSIS — R062 Wheezing: Secondary | ICD-10-CM | POA: Diagnosis not present

## 2020-01-16 DIAGNOSIS — G4733 Obstructive sleep apnea (adult) (pediatric): Secondary | ICD-10-CM | POA: Diagnosis not present

## 2020-01-18 ENCOUNTER — Other Ambulatory Visit: Payer: Self-pay | Admitting: Medical

## 2020-01-20 DIAGNOSIS — G4733 Obstructive sleep apnea (adult) (pediatric): Secondary | ICD-10-CM | POA: Diagnosis not present

## 2020-01-20 DIAGNOSIS — R062 Wheezing: Secondary | ICD-10-CM | POA: Diagnosis not present

## 2020-01-20 DIAGNOSIS — J449 Chronic obstructive pulmonary disease, unspecified: Secondary | ICD-10-CM | POA: Diagnosis not present

## 2020-01-20 DIAGNOSIS — J9622 Acute and chronic respiratory failure with hypercapnia: Secondary | ICD-10-CM | POA: Diagnosis not present

## 2020-02-04 LAB — CUP PACEART REMOTE DEVICE CHECK
Battery Remaining Longevity: 125 mo
Battery Remaining Percentage: 95.5 %
Battery Voltage: 3.01 V
Brady Statistic AP VP Percent: 50 %
Brady Statistic AP VS Percent: 1 %
Brady Statistic AS VP Percent: 35 %
Brady Statistic AS VS Percent: 15 %
Brady Statistic RA Percent Paced: 50 %
Brady Statistic RV Percent Paced: 85 %
Date Time Interrogation Session: 20210517020013
Implantable Lead Implant Date: 20190808
Implantable Lead Implant Date: 20190808
Implantable Lead Location: 753859
Implantable Lead Location: 753860
Implantable Pulse Generator Implant Date: 20190808
Lead Channel Impedance Value: 430 Ohm
Lead Channel Impedance Value: 490 Ohm
Lead Channel Pacing Threshold Amplitude: 0.5 V
Lead Channel Pacing Threshold Amplitude: 0.75 V
Lead Channel Pacing Threshold Pulse Width: 0.5 ms
Lead Channel Pacing Threshold Pulse Width: 0.5 ms
Lead Channel Sensing Intrinsic Amplitude: 12 mV
Lead Channel Sensing Intrinsic Amplitude: 3.4 mV
Lead Channel Setting Pacing Amplitude: 1 V
Lead Channel Setting Pacing Amplitude: 1.5 V
Lead Channel Setting Pacing Pulse Width: 0.5 ms
Lead Channel Setting Sensing Sensitivity: 2.5 mV
Pulse Gen Model: 2272
Pulse Gen Serial Number: 9052893

## 2020-02-05 ENCOUNTER — Ambulatory Visit (INDEPENDENT_AMBULATORY_CARE_PROVIDER_SITE_OTHER): Payer: Medicare HMO | Admitting: *Deleted

## 2020-02-05 DIAGNOSIS — I4819 Other persistent atrial fibrillation: Secondary | ICD-10-CM | POA: Diagnosis not present

## 2020-02-06 NOTE — Progress Notes (Signed)
Remote pacemaker transmission.   

## 2020-02-08 DIAGNOSIS — R062 Wheezing: Secondary | ICD-10-CM | POA: Diagnosis not present

## 2020-02-08 DIAGNOSIS — J9622 Acute and chronic respiratory failure with hypercapnia: Secondary | ICD-10-CM | POA: Diagnosis not present

## 2020-02-08 DIAGNOSIS — G4733 Obstructive sleep apnea (adult) (pediatric): Secondary | ICD-10-CM | POA: Diagnosis not present

## 2020-02-08 DIAGNOSIS — J449 Chronic obstructive pulmonary disease, unspecified: Secondary | ICD-10-CM | POA: Diagnosis not present

## 2020-02-12 ENCOUNTER — Other Ambulatory Visit: Payer: Self-pay | Admitting: Family Medicine

## 2020-02-12 DIAGNOSIS — E785 Hyperlipidemia, unspecified: Secondary | ICD-10-CM | POA: Diagnosis not present

## 2020-02-12 DIAGNOSIS — M1990 Unspecified osteoarthritis, unspecified site: Secondary | ICD-10-CM | POA: Diagnosis not present

## 2020-02-12 DIAGNOSIS — Z6841 Body Mass Index (BMI) 40.0 and over, adult: Secondary | ICD-10-CM | POA: Diagnosis not present

## 2020-02-12 DIAGNOSIS — I10 Essential (primary) hypertension: Secondary | ICD-10-CM | POA: Diagnosis not present

## 2020-02-12 DIAGNOSIS — Z7901 Long term (current) use of anticoagulants: Secondary | ICD-10-CM | POA: Diagnosis not present

## 2020-02-12 DIAGNOSIS — N529 Male erectile dysfunction, unspecified: Secondary | ICD-10-CM | POA: Diagnosis not present

## 2020-02-12 DIAGNOSIS — C61 Malignant neoplasm of prostate: Secondary | ICD-10-CM | POA: Diagnosis not present

## 2020-02-12 DIAGNOSIS — M109 Gout, unspecified: Secondary | ICD-10-CM | POA: Diagnosis not present

## 2020-02-12 DIAGNOSIS — M1A9XX0 Chronic gout, unspecified, without tophus (tophi): Secondary | ICD-10-CM

## 2020-02-12 DIAGNOSIS — G4733 Obstructive sleep apnea (adult) (pediatric): Secondary | ICD-10-CM | POA: Diagnosis not present

## 2020-02-15 DIAGNOSIS — G4733 Obstructive sleep apnea (adult) (pediatric): Secondary | ICD-10-CM | POA: Diagnosis not present

## 2020-02-15 DIAGNOSIS — R062 Wheezing: Secondary | ICD-10-CM | POA: Diagnosis not present

## 2020-02-19 ENCOUNTER — Other Ambulatory Visit: Payer: Self-pay | Admitting: Urology

## 2020-02-19 DIAGNOSIS — M8580 Other specified disorders of bone density and structure, unspecified site: Secondary | ICD-10-CM

## 2020-02-20 ENCOUNTER — Ambulatory Visit: Payer: Medicare HMO | Admitting: Podiatry

## 2020-02-20 ENCOUNTER — Encounter: Payer: Self-pay | Admitting: Podiatry

## 2020-02-20 ENCOUNTER — Other Ambulatory Visit: Payer: Self-pay

## 2020-02-20 VITALS — Temp 97.5°F

## 2020-02-20 DIAGNOSIS — I872 Venous insufficiency (chronic) (peripheral): Secondary | ICD-10-CM

## 2020-02-20 DIAGNOSIS — M79675 Pain in left toe(s): Secondary | ICD-10-CM | POA: Diagnosis not present

## 2020-02-20 DIAGNOSIS — R062 Wheezing: Secondary | ICD-10-CM | POA: Diagnosis not present

## 2020-02-20 DIAGNOSIS — B351 Tinea unguium: Secondary | ICD-10-CM

## 2020-02-20 DIAGNOSIS — D689 Coagulation defect, unspecified: Secondary | ICD-10-CM | POA: Diagnosis not present

## 2020-02-20 DIAGNOSIS — G4733 Obstructive sleep apnea (adult) (pediatric): Secondary | ICD-10-CM | POA: Diagnosis not present

## 2020-02-20 DIAGNOSIS — J9622 Acute and chronic respiratory failure with hypercapnia: Secondary | ICD-10-CM | POA: Diagnosis not present

## 2020-02-20 DIAGNOSIS — M79674 Pain in right toe(s): Secondary | ICD-10-CM

## 2020-02-20 DIAGNOSIS — I739 Peripheral vascular disease, unspecified: Secondary | ICD-10-CM | POA: Diagnosis not present

## 2020-02-20 DIAGNOSIS — N289 Disorder of kidney and ureter, unspecified: Secondary | ICD-10-CM

## 2020-02-20 DIAGNOSIS — J449 Chronic obstructive pulmonary disease, unspecified: Secondary | ICD-10-CM | POA: Diagnosis not present

## 2020-02-20 NOTE — Progress Notes (Signed)
This patient returns to my office for at risk foot care.  This patient requires this care by a professional since this patient will be at risk due to having PVD, thrombocytopenia venous stasis.  Patient is taking eliquiss.  This patient is unable to cut nails himself since the patient cannot reach his nails.These nails are painful walking and wearing shoes.  This patient presents for at risk foot care today.  Patient is in a wheelchair.  General Appearance  Alert, conversant and in no acute stress.  Vascular  Dorsalis pedis and posterior tibial  pulses are not  palpable  Bilaterally due to severe swelling.  .  Capillary return is within normal limits  bilaterally. Temperature is within normal limits  Bilaterally.  Purplish toes  B/L.  Severe venous stasis  B/l.  Neurologic  Senn-Weinstein monofilament wire test diminished  bilaterally. Muscle power within normal limits bilaterally.  Dropfoot.  Nails Thick disfigured discolored nails with subungual debris  from hallux to fifth toes bilaterally. No evidence of bacterial infection or drainage bilaterally.  Orthopedic  No limitations of motion  feet .  No crepitus or effusions noted.  No bony pathology or digital deformities noted.  Skin  normotropic skin with no porokeratosis noted bilaterally.  No signs of infections or ulcers noted.   Dorsal blood blisters 1-3 right foot and fifth toe left foot.  Onychomycosis  Pain in right toes  Pain in left toes  Consent was obtained for treatment procedures.   Mechanical debridement of nails 1-5  bilaterally performed with a nail nipper.  Filed with dremel without incident. No infection or ulcer.  Discussed his dropfoot and walking on his toes causing blood blisters.  He admits to having vascular studies performed.   Return office visit   3 months                   Told patient to return for periodic foot care and evaluation due to potential at risk complications.   Gardiner Barefoot DPM

## 2020-02-24 ENCOUNTER — Encounter: Payer: Self-pay | Admitting: Internal Medicine

## 2020-02-26 ENCOUNTER — Encounter: Payer: Self-pay | Admitting: Internal Medicine

## 2020-02-26 ENCOUNTER — Other Ambulatory Visit: Payer: Self-pay

## 2020-02-26 ENCOUNTER — Ambulatory Visit: Payer: Medicare HMO | Admitting: Internal Medicine

## 2020-02-26 ENCOUNTER — Other Ambulatory Visit: Payer: Self-pay | Admitting: Family Medicine

## 2020-02-26 DIAGNOSIS — G4733 Obstructive sleep apnea (adult) (pediatric): Secondary | ICD-10-CM

## 2020-02-26 DIAGNOSIS — J9611 Chronic respiratory failure with hypoxia: Secondary | ICD-10-CM

## 2020-02-26 DIAGNOSIS — I350 Nonrheumatic aortic (valve) stenosis: Secondary | ICD-10-CM | POA: Diagnosis not present

## 2020-02-26 NOTE — Assessment & Plan Note (Signed)
Dependant on O2 2L through VPAP while sleeping- main problem is obesity hypoventilation syndrome with COPD mixed type. Benefits and needs to continue O2 2L while sleeping.

## 2020-02-26 NOTE — Assessment & Plan Note (Signed)
Benefits from VPAP with good compliance and control, better sleep. Plan- continue VPAP 12/1`2, PS4 with O2 2l

## 2020-02-26 NOTE — Assessment & Plan Note (Signed)
Continues cardiology f/u.

## 2020-02-26 NOTE — Patient Instructions (Addendum)
It is important to continue your oxygen at 2L during sleep along with your VPAP 12/12, PS 4.   Please call if we can help   You can keep your appointment this Fall.

## 2020-02-26 NOTE — Progress Notes (Signed)
HPI M former smoker followed for OSA, COPD, chronic hypoxic respiratory failure complicated by morbid obesity, OHS, aortic stenosis/ AVR,  Osteoarthritis, Anemia PFT 05/05/2015-severe obstructive airways disease, insignificant response to bronchodilator, severe restriction, moderate diffusion defect FVC 2.17/46%, FEV1 1.50/44%, ratio 0.69, TLC 64%, DLCO 57% with volume correction to 62% of predicted NPSG 06/21/85- AHI 98/hour with desaturation to 64% BiPAP titration study-21/17, PS 4 cwp O2 2L sleep, with residual AHI 52 "unknown" events, either obstructive or central and presumably mostly RERAs.  ------------------------------------------------------------   07/09/2019- 79 year old male former smoker followed for OSA, COPD mixed type, complicated by morbid obesity, aortic stenosis/aVR/ pacemaker, peripheral vascular disease, osteoarthritis, AVM colon, HBP, gout, Prostate cancer Body weight today 345 pounds  VPAPauto 12/12, PS 4/ O2 2L sleep/Advanced Download compliance 100%, AHI 0.4/ hr -----OSA on BIPAP 12/12, DME: [employer]; no complaints Denies acute issues, saying breathing is stable and he is very comfortable with his VPAP.  02/26/20- 79 year old male former smoker followed for OSA, COPD mixed type, Chronic Respiratory failure with Hypoxia complicated by morbid obesity/ OHS, aortic stenosis/TAVR/ pacemaker, peripheral vascular disease, osteoarthritis, AVM colon, HBP, gout, Prostate cancer Body weight today 340 lbs VPAPauto 12/12, PS 4/ O2 2L sleep/ [employer] Download compliance 100%, AHI 0.6/ hr      Doing very well with VPAP, used every night. Has had 2 Moderna Covax. Concern today is need to document ongoing need for O2 with sleep to continue his concentrator. Definitely helps breathing at night through VPAP.   ROS- see HPI  + = positive Constitutional:   No-   weight loss, night sweats, fevers, chills, +fatigue, lassitude. HEENT:   No-  headaches, difficulty swallowing, tooth/dental problems,  sore throat,       No-  sneezing, itching, ear ache, nasal congestion, post nasal drip,  CV:  No-   chest pain, orthopnea, PND, swelling in lower extremities, anasarca,  dizziness, palpitations Resp: +shortness of breath with exertion or at rest.              No-   productive cough,  No non-productive cough,  No- coughing up of blood.              No-   change in color of mucus.  No- wheezing.   Skin: No-   rash or lesions. GI:  No-   heartburn, indigestion, abdominal pain, nausea, vomiting, GU:  MS:  No-   joint pain or swelling.   Neuro-     nothing unusual Psych:  No- change in mood or affect. No depression or anxiety.  No memory loss.  OBJ General- Alert, Oriented, Affect-appropriate, Distress- none acute. +Morbidly obese, +power wheelchair  Skin- rash-none, lesions- none, excoriation- none Lymphadenopathy- none Head- atraumatic            Eyes- Gross vision intact, PERRLA, conjunctivae clear secretions            Ears- Hearing, canals-normal            Nose- Clear, no-Septal dev, mucus, polyps, erosion, perforation             Throat- Mallampati III-IV , mucosa clear , drainage- none, tonsils- atrophic Neck- flexible , trachea midline, no stridor , thyroid nl, carotid no bruit Chest - symmetrical excursion , unlabored           Heart/CV- RRR , murmur-none, no gallop, no rub, nl s1 s2                           -  JVD- none , edema+, stasis changes+, varices- none           Lung- clear to P&A, wheeze- none, cough- none , dullness-none, rub- none, no valve click heard                           Room air saturation while awake and sitting upright on arrival today 91%          Chest wall-+ pacemaker left  abd-  Br/ Gen/ Rectal- Not done, not indicated Extrem- cyanosis- none, clubbing, none, atrophy- none, strength- nl Neuro- grossly intact to observation, alert and pleasant

## 2020-02-27 ENCOUNTER — Other Ambulatory Visit: Payer: Self-pay | Admitting: Cardiovascular Disease

## 2020-03-06 ENCOUNTER — Other Ambulatory Visit: Payer: Self-pay

## 2020-03-06 ENCOUNTER — Ambulatory Visit (INDEPENDENT_AMBULATORY_CARE_PROVIDER_SITE_OTHER): Payer: Medicare HMO | Admitting: Family Medicine

## 2020-03-06 VITALS — BP 115/52 | HR 67

## 2020-03-06 DIAGNOSIS — D649 Anemia, unspecified: Secondary | ICD-10-CM | POA: Diagnosis not present

## 2020-03-06 DIAGNOSIS — M1A9XX0 Chronic gout, unspecified, without tophus (tophi): Secondary | ICD-10-CM | POA: Diagnosis not present

## 2020-03-06 DIAGNOSIS — R739 Hyperglycemia, unspecified: Secondary | ICD-10-CM | POA: Diagnosis not present

## 2020-03-06 DIAGNOSIS — D696 Thrombocytopenia, unspecified: Secondary | ICD-10-CM | POA: Diagnosis not present

## 2020-03-06 DIAGNOSIS — C61 Malignant neoplasm of prostate: Secondary | ICD-10-CM | POA: Diagnosis not present

## 2020-03-06 DIAGNOSIS — N289 Disorder of kidney and ureter, unspecified: Secondary | ICD-10-CM | POA: Diagnosis not present

## 2020-03-06 DIAGNOSIS — I10 Essential (primary) hypertension: Secondary | ICD-10-CM

## 2020-03-06 DIAGNOSIS — E7800 Pure hypercholesterolemia, unspecified: Secondary | ICD-10-CM

## 2020-03-06 MED ORDER — GABAPENTIN 300 MG PO CAPS: ORAL_CAPSULE | ORAL | 2 refills | Status: DC

## 2020-03-06 NOTE — Assessment & Plan Note (Signed)
Hydrate and monitor 

## 2020-03-07 ENCOUNTER — Ambulatory Visit: Payer: Medicare HMO | Admitting: Family Medicine

## 2020-03-10 ENCOUNTER — Other Ambulatory Visit (INDEPENDENT_AMBULATORY_CARE_PROVIDER_SITE_OTHER): Payer: Medicare HMO

## 2020-03-10 DIAGNOSIS — M1A9XX0 Chronic gout, unspecified, without tophus (tophi): Secondary | ICD-10-CM | POA: Diagnosis not present

## 2020-03-10 DIAGNOSIS — J9622 Acute and chronic respiratory failure with hypercapnia: Secondary | ICD-10-CM | POA: Diagnosis not present

## 2020-03-10 DIAGNOSIS — E7800 Pure hypercholesterolemia, unspecified: Secondary | ICD-10-CM

## 2020-03-10 DIAGNOSIS — R062 Wheezing: Secondary | ICD-10-CM | POA: Diagnosis not present

## 2020-03-10 DIAGNOSIS — D649 Anemia, unspecified: Secondary | ICD-10-CM

## 2020-03-10 DIAGNOSIS — D696 Thrombocytopenia, unspecified: Secondary | ICD-10-CM | POA: Diagnosis not present

## 2020-03-10 DIAGNOSIS — G4733 Obstructive sleep apnea (adult) (pediatric): Secondary | ICD-10-CM | POA: Diagnosis not present

## 2020-03-10 DIAGNOSIS — I10 Essential (primary) hypertension: Secondary | ICD-10-CM | POA: Diagnosis not present

## 2020-03-10 DIAGNOSIS — R739 Hyperglycemia, unspecified: Secondary | ICD-10-CM | POA: Diagnosis not present

## 2020-03-10 DIAGNOSIS — J449 Chronic obstructive pulmonary disease, unspecified: Secondary | ICD-10-CM | POA: Diagnosis not present

## 2020-03-10 LAB — CBC
HCT: 38.1 % — ABNORMAL LOW (ref 39.0–52.0)
Hemoglobin: 12.1 g/dL — ABNORMAL LOW (ref 13.0–17.0)
MCHC: 31.8 g/dL (ref 30.0–36.0)
MCV: 99.8 fl (ref 78.0–100.0)
Platelets: 126 10*3/uL — ABNORMAL LOW (ref 150.0–400.0)
RBC: 3.81 Mil/uL — ABNORMAL LOW (ref 4.22–5.81)
RDW: 17 % — ABNORMAL HIGH (ref 11.5–15.5)
WBC: 5.6 10*3/uL (ref 4.0–10.5)

## 2020-03-10 LAB — COMPREHENSIVE METABOLIC PANEL
ALT: 21 U/L (ref 0–53)
AST: 22 U/L (ref 0–37)
Albumin: 3.7 g/dL (ref 3.5–5.2)
Alkaline Phosphatase: 68 U/L (ref 39–117)
BUN: 25 mg/dL — ABNORMAL HIGH (ref 6–23)
CO2: 39 mEq/L — ABNORMAL HIGH (ref 19–32)
Calcium: 9.7 mg/dL (ref 8.4–10.5)
Chloride: 96 mEq/L (ref 96–112)
Creatinine, Ser: 1.11 mg/dL (ref 0.40–1.50)
GFR: 64.13 mL/min (ref >60.00)
Glucose, Bld: 122 mg/dL — ABNORMAL HIGH (ref 70–99)
Potassium: 4 mEq/L (ref 3.5–5.1)
Sodium: 141 mEq/L (ref 135–145)
Total Bilirubin: 0.4 mg/dL (ref 0.2–1.2)
Total Protein: 6.2 g/dL (ref 6.0–8.3)

## 2020-03-10 LAB — LIPID PANEL
Cholesterol: 116 mg/dL (ref 0–200)
HDL: 29.1 mg/dL — ABNORMAL LOW (ref >39.00)
LDL Cholesterol: 53 mg/dL (ref 0–99)
NonHDL: 87.26
Total CHOL/HDL Ratio: 4
Triglycerides: 169 mg/dL — ABNORMAL HIGH (ref 0.0–149.0)
VLDL: 33.8 mg/dL (ref 0.0–40.0)

## 2020-03-10 LAB — URIC ACID: Uric Acid, Serum: 7.4 mg/dL (ref 4.0–7.8)

## 2020-03-10 LAB — TSH: TSH: 1.4 u[IU]/mL (ref 0.35–4.50)

## 2020-03-10 LAB — HEMOGLOBIN A1C: Hgb A1c MFr Bld: 5.7 % (ref 4.6–6.5)

## 2020-03-12 NOTE — Progress Notes (Signed)
Virtual Visit via phone Note  I connected with [name] on 03/06/20 at  3:40 PM EDT by a phone enabled telemedicine application and verified that I am speaking with the correct person using two identifiers.  Location: Patient: home, patient was prevent in visit Provider: office, patient was present in visit   I discussed the limitations of evaluation and management by telemedicine and the availability of in person appointments. The patient expressed understanding and agreed to proceed. CMA was able to get the patient set up on phone visit after being unable to set up a video visit.    Subjective:    Patient ID: [name], male    DOB: [birth year], 79 y.o.   MRN: [id number]  Chief Complaint  Patient presents with  . Hypertension    follow up    HPI Patient is in today for follow up on chronic medical concerns. No recent febrile illness or hospitalizations. He has been following with urology after his prostate cancer diagnosis but he has responded well and has no acute concerns. His back continues to bother him most days. Denies CP/palp/SOB/HA/congestion/fevers/GI or GU c/o. Taking meds as prescribed  Past Medical History:  Diagnosis Date  . 1st degree AV block 11/09/2018   Noted on EKG   . Anemia   . Aortic stenosis, severe    S/p Edwards Sapien 3 Transcatheter Heart Valve (size 26 mm, model # [id number], serial # [id number])  . Arthritis   . Back pain   . Bursitis   . Cataract    left immature  . Cellulitis 10/12/2015  . CHF (congestive heart failure) ([place])   . Complication of anesthesia    Halucinations  . Constipation    takes Miralax daily as well as Senokot daily  . DDD (degenerative disc disease)   . Gout    takes Allopurinol daily  . Heart valve disorder   . History of blood clots 1962   knee  . History of blood transfusion    no abnormal reaction noted  . History of shingles   . Hyperlipidemia    takes Atorvastatin daily  . Hypertension    takes  Lisinopril daily  . Joint pain   . Low back pain 01/25/2017  . Morbid obesity ([place])   . Neck pain on left side 01/25/2017  . OSA (obstructive sleep apnea)   . Osteoarthritis   . Peripheral edema    takes Furosemide daily  . Peripheral neuropathy    takes Gabapentin daily  . Peroneal palsy    significant right foot drop  . Persistent atrial fibrillation ([place])   . Pneumonia 25+yrs ago   hx of  . Prostate cancer ([place])   . Prostate cancer ([place]) 02/13/2019  . RBBB 11/09/2018   Noted on EKG  . Shortness of breath   . Swelling of extremity   . Thrombocytopenia ([place])   . Urinary frequency   . Urinary urgency   . Valvular heart disease   . Venous stasis dermatitis     Past Surgical History:  Procedure Laterality Date  . CARDIAC CATHETERIZATION N/A 05/02/2015   Procedure: Right/Left Heart Cath and Coronary Angiography;  Surgeon: Burnell Blanks, MD;  Location: [place] [facility];  Service: Cardiovascular;  Laterality: N/A;  . CARDIOVERSION N/A 10/27/2018   Procedure: CARDIOVERSION;  Surgeon: Fay Records, MD;  Location: [facility] ENDOSCOPY;  Service: Cardiovascular;  Laterality: N/A;  . COLONOSCOPY N/A 02/07/2013   Procedure: COLONOSCOPY;  Surgeon: Juanita Craver, MD;  Location: [facility] ENDOSCOPY;  Service: Endoscopy;  Laterality: N/A;  . COLONOSCOPY N/A 02/09/2013   Procedure: COLONOSCOPY;  Surgeon: Beryle Beams, MD;  Location: [place];  Service: Endoscopy;  Laterality: N/A;  . ESOPHAGOGASTRODUODENOSCOPY N/A 02/07/2013   Procedure: ESOPHAGOGASTRODUODENOSCOPY (EGD);  Surgeon: Juanita Craver, MD;  Location: [facility] ENDOSCOPY;  Service: Endoscopy;  Laterality: N/A;  . GIVENS CAPSULE STUDY N/A 02/09/2013   Procedure: GIVENS CAPSULE STUDY;  Surgeon: Beryle Beams, MD;  Location: [place];  Service: Endoscopy;  Laterality: N/A;  . HERNIA REPAIR     umbilical hernia  . KNEE SURGERY Right    multiple knee surgeries due to complication of R TKA  . LAMINOTOMY  3810   c6-t2  . PACEMAKER IMPLANT N/A  04/27/2018   Procedure: PACEMAKER IMPLANT;  Surgeon: Constance Haw, MD;  Location: [place] [facility];  Service: Cardiovascular;  Laterality: N/A;  . PROSTATE BIOPSY N/A 04/12/2019   Procedure: BIOPSY TRANSRECTAL ULTRASONIC PROSTATE (TUBP) CYSTOSCOPY;  Surgeon: [name] Hughs, MD;  Location: [facility];  Service: Urology;  Laterality: N/A;  . SPINE SURGERY    . TEE WITHOUT CARDIOVERSION N/A 03/07/2015   Procedure: TRANSESOPHAGEAL ECHOCARDIOGRAM (TEE);  Surgeon: Josue Hector, MD;  Location: [facility] ENDOSCOPY;  Service: Cardiovascular;  Laterality: N/A;  ANES TO BRING PROPOFOL PER DOCTOR  . TEE WITHOUT CARDIOVERSION N/A 06/10/2015   Procedure: TRANSESOPHAGEAL ECHOCARDIOGRAM (TEE);  Surgeon: Burnell Blanks, MD;  Location: [place];  Service: Open Heart Surgery;  Laterality: N/A;  . TOTAL KNEE ARTHROPLASTY     right   x4 left x 1   . TRANSCATHETER AORTIC VALVE REPLACEMENT, TRANSFEMORAL Left 06/10/2015   Procedure: TRANSCATHETER AORTIC VALVE REPLACEMENT, TRANSFEMORAL;  Surgeon: Burnell Blanks, MD;  Location: [place];  Service: Open Heart Surgery;  Laterality: Left;    Family History  Problem Relation Age of Onset  . Other Father        POSSIBLE HEART ATTACK  . Arthritis Sister        "crippling"  . Prostate cancer Brother   . Arthritis Sister   . Breast cancer Neg Hx   . Colon cancer Neg Hx     Social History   Socioeconomic History  . Marital status: Married    Spouse name: [name]  . Number of children: 3  . Years of education: Not on file  . Highest education level: Not on file  Occupational History  . Occupation: retired Nurse, mental health  . Smoking status: Former Smoker    Years: 30.00    Types: Pipe    Quit date: 09/20/2006    Years since quitting: 13.4  . Smokeless tobacco: Never Used  Vaping Use  . Vaping Use: Never used  Substance and Sexual Activity  . Alcohol use: No    Alcohol/week: 0.0 standard drinks  . Drug use: No  . Sexual activity: Not  Currently    Comment: lives with wife, retired, no dietary restrictions  Other Topics Concern  . Not on file  Social History Narrative  . Not on file   Social Determinants of Health   Financial Resource Strain:   . Difficulty of Paying Living Expenses:   Food Insecurity:   . Worried About Charity fundraiser in the Last Year:   . Arboriculturist in the Last Year:   Transportation Needs:   . Film/video editor (Medical):   [name] Lack of Transportation (Non-Medical):   Physical Activity:   . Days  of Exercise per Week:   . Minutes of Exercise per Session:   Stress:   . Feeling of Stress :   Social Connections:   . Frequency of Communication with Friends and Family:   . Frequency of Social Gatherings with Friends and Family:   . Attends Religious Services:   . Active Member of Clubs or Organizations:   . Attends Archivist Meetings:   [name] Marital Status:   Intimate Partner Violence:   . Fear of Current or Ex-Partner:   . Emotionally Abused:   [name] Physically Abused:   . Sexually Abused:     Outpatient Medications Prior to Visit  Medication Sig Dispense Refill  . acetaminophen (TYLENOL) 500 MG tablet Take 1,000 mg by mouth 2 (two) times daily.     [name] albuterol (VENTOLIN HFA) 108 (90 Base) MCG/ACT inhaler Inhale 2 puffs into the lungs every 6 (six) hours as needed. 18 g 0  . allopurinol (ZYLOPRIM) 100 MG tablet TAKE 1 TABLET BY MOUTH EVERY DAY 90 tablet 1  . atorvastatin (LIPITOR) 40 MG tablet Take 1 and a half tablet daily by mouth  (60mg ) 135 tablet 5  . Cholecalciferol (VITAMIN D3) 125 MCG (5000 UT) CAPS Take 5,000 Units by mouth daily.    . Coenzyme Q10 (COQ10) 100 MG CAPS Take 300 mg by mouth daily.     [name] CRANBERRY PO Take 1 capsule by mouth 2 (two) times a day.    [name] ELIQUIS 5 MG TABS tablet TAKE 1 TABLET BY MOUTH TWICE A DAY 60 tablet 6  . furosemide (LASIX) 20 MG tablet TAKE 1 TABLET BY MOUTH TWICE A DAY 180 tablet 2  . HEMATINIC/FOLIC ACID 725-3 MG TABS TAKE 1  TABLET BY MOUTH EVERY DAY 90 tablet 1  . levocetirizine (XYZAL) 5 MG tablet Take 1 tablet (5 mg total) by mouth every evening. 30 tablet 3  . Misc Natural Products (TART CHERRY ADVANCED PO) Take 1,200 mg by mouth 2 (two) times a day.     . montelukast (SINGULAIR) 10 MG tablet Take 1 tablet (10 mg total) by mouth at bedtime. 30 tablet 3  . Multiple Vitamins-Minerals (CENTRUM SILVER PO) Take 1 tablet by mouth daily.    . Phenazopyridine HCl (AZO-STANDARD PO) Take by mouth.    . polyethylene glycol (MIRALAX / GLYCOLAX) packet Take 17 g by mouth daily.    . sennosides-docusate sodium (SENOKOT-S) 8.6-50 MG tablet Take 1 tablet by mouth daily.     . vitamin C (ASCORBIC ACID) 500 MG tablet Take 500 mg by mouth 2 (two) times daily.     [name] gabapentin (NEURONTIN) 300 MG capsule TAKE 1 CAPSULE BY MOUTH THREE TIMES A DAY 270 capsule 2   No facility-administered medications prior to visit.    Allergies  Allergen Reactions  . Coumadin [Warfarin Sodium] Other (See Comments)    States he can't be on this-bleeds out    Review of Systems  Constitutional: Negative for fever and malaise/fatigue.  HENT: Negative for congestion.   Eyes: Negative for blurred vision.  Respiratory: Negative for shortness of breath.   Cardiovascular: Negative for chest pain, palpitations and leg swelling.  Gastrointestinal: Negative for abdominal pain, blood in stool and nausea.  Genitourinary: Negative for dysuria and frequency.  Musculoskeletal: Negative for falls.  Skin: Negative for rash.  Neurological: Negative for dizziness, loss of consciousness and headaches.  Endo/Heme/Allergies: Negative for environmental allergies.  Psychiatric/Behavioral: Negative for depression. The patient is not nervous/anxious.  Objective:    Physical Exam unable to obtain via phone  BP (!) 115/52 (BP Location: Right Arm)   Pulse 67   SpO2 (!) 88%  Wt Readings from Last 3 Encounters:  02/26/20 (!) 340 lb (154.2 kg)  10/25/19  (!) 340 lb (154.2 kg)  08/07/19 (!) 345 lb (156.5 kg)    Diabetic Foot Exam - Simple   No data filed     Lab Results  Component Value Date   WBC 5.6 03/10/2020   HGB 12.1 (L) 03/10/2020   HCT 38.1 (L) 03/10/2020   PLT 126.0 (L) 03/10/2020   GLUCOSE 122 (H) 03/10/2020   CHOL 116 03/10/2020   TRIG 169.0 (H) 03/10/2020   HDL 29.10 (L) 03/10/2020   LDLDIRECT 85.0 07/28/2017   LDLCALC 53 03/10/2020   ALT 21 03/10/2020   AST 22 03/10/2020   NA 141 03/10/2020   K 4.0 03/10/2020   CL 96 03/10/2020   CREATININE 1.11 03/10/2020   BUN 25 (H) 03/10/2020   CO2 39 (H) 03/10/2020   TSH 1.40 03/10/2020   PSA 24.63 (H) 03/19/2019   INR 1.32 06/10/2015   HGBA1C 5.7 03/10/2020    Lab Results  Component Value Date   TSH 1.40 03/10/2020   Lab Results  Component Value Date   WBC 5.6 03/10/2020   HGB 12.1 (L) 03/10/2020   HCT 38.1 (L) 03/10/2020   MCV 99.8 03/10/2020   PLT 126.0 (L) 03/10/2020   Lab Results  Component Value Date   NA 141 03/10/2020   K 4.0 03/10/2020   CO2 39 (H) 03/10/2020   GLUCOSE 122 (H) 03/10/2020   BUN 25 (H) 03/10/2020   CREATININE 1.11 03/10/2020   BILITOT 0.4 03/10/2020   ALKPHOS 68 03/10/2020   AST 22 03/10/2020   ALT 21 03/10/2020   PROT 6.2 03/10/2020   ALBUMIN 3.7 03/10/2020   CALCIUM 9.7 03/10/2020   ANIONGAP 11 04/11/2019   GFR 64.13 03/10/2020   Lab Results  Component Value Date   CHOL 116 03/10/2020   Lab Results  Component Value Date   HDL 29.10 (L) 03/10/2020   Lab Results  Component Value Date   LDLCALC 53 03/10/2020   Lab Results  Component Value Date   TRIG 169.0 (H) 03/10/2020   Lab Results  Component Value Date   CHOLHDL 4 03/10/2020   Lab Results  Component Value Date   HGBA1C 5.7 03/10/2020       Assessment & Plan:   Problem List Items Addressed This Visit    HYPERCHOLESTEROLEMIA - Primary    Encouraged heart healthy diet, increase exercise, avoid trans fats, consider a krill oil cap daily. Tolerating  Atorvastatin.       Relevant Orders   Comprehensive metabolic panel (Completed)   Lipid panel (Completed)   Gout    Hydrate and monitor      Relevant Orders   Comprehensive metabolic panel (Completed)   Uric acid (Completed)   Anemia   Relevant Orders   Comprehensive metabolic panel (Completed)   Thrombocytopenia, unspecified (HCC)   Relevant Orders   Comprehensive metabolic panel (Completed)   Hyperglycemia    hgba1c acceptable, minimize simple carbs. Increase exercise as tolerated.       Relevant Orders   Hemoglobin A1c (Completed)   Essential hypertension    Monitor and report any concerns. no changes to meds. Encouraged heart healthy diet such as the DASH diet and exercise as tolerated.       Relevant Orders  CBC (Completed)   Comprehensive metabolic panel (Completed)   TSH (Completed)   Renal insufficiency    Hydrate and onitor      Malignant neoplasm of prostate [facility])    Has been working with Dr Tammi Klippel and is doing very well. No new concerns or symptoms.          I have changed Lex D. Feijoo's gabapentin. I am also having him maintain his sennosides-docusate sodium, acetaminophen, CoQ10, polyethylene glycol, Misc Natural Products (TART CHERRY ADVANCED PO), vitamin C, Multiple Vitamins-Minerals (CENTRUM SILVER PO), Vitamin D3, CRANBERRY PO, Phenazopyridine HCl (AZO-STANDARD PO), atorvastatin, Eliquis, levocetirizine, montelukast, albuterol, Hematinic/Folic Acid, allopurinol, and furosemide.  Meds ordered this encounter  Medications  . gabapentin (NEURONTIN) 300 MG capsule    Sig: 1 cap po bid and 2 caps po qhs    Dispense:  120 capsule    Refill:  2     I discussed the assessment and treatment plan with the patient. The patient was provided an opportunity to ask questions and all were answered. The patient agreed with the plan and demonstrated an understanding of the instructions.   The patient was advised to call back or seek an in-person evaluation if  the symptoms worsen or if the condition fails to improve as anticipated.  I provided 25 minutes of non-face-to-face time during this encounter.   Penni Homans, MD

## 2020-03-12 NOTE — Assessment & Plan Note (Signed)
Hydrate and onitor

## 2020-03-12 NOTE — Assessment & Plan Note (Signed)
hgba1c acceptable, minimize simple carbs. Increase exercise as tolerated.  

## 2020-03-12 NOTE — Assessment & Plan Note (Signed)
Encouraged heart healthy diet, increase exercise, avoid trans fats, consider a krill oil cap daily. Tolerating Atorvastatin 

## 2020-03-12 NOTE — Assessment & Plan Note (Signed)
Has been working with Dr Tammi Klippel and is doing very well. No new concerns or symptoms.

## 2020-03-12 NOTE — Assessment & Plan Note (Signed)
Monitor and report any concerns. no changes to meds. Encouraged heart healthy diet such as the DASH diet and exercise as tolerated.  

## 2020-03-14 NOTE — Progress Notes (Signed)
Subjective:   [name] is a 79 y.o. male who presents for Medicare Annual/Subsequent preventive examination.  Review of Systems     Cardiac Risk Factors include: advanced age (>43men, >23 women);male gender;obesity (BMI >30kg/m2);sedentary lifestyle     Objective:    Today's Vitals   03/17/20 1323  BP: 115/62  Pulse: 64  Temp: (!) 97 F (36.1 C)  TempSrc: Temporal  SpO2: 97%  Weight: (!) 340 lb (154.2 kg)  Height: 6\' 2"  (1.88 m)   Body mass index is 43.65 kg/m.  Advanced Directives 03/17/2020 05/15/2019 04/12/2019 04/11/2019 03/15/2019 10/28/2018 10/27/2018  Does Patient Have a Medical Advance Directive? Yes Yes Yes Yes Yes Yes Yes  Type of Advance Directive Living will [place];Living will [place];Living will [place];Living will [place];Living will Living will;Healthcare Power of Attorney Living will;Healthcare Power of Attorney  Does patient want to make changes to medical advance directive? No - Patient declined No - Patient declined No - Patient declined No - Patient declined No - Patient declined No - Patient declined -  Copy of [place] in Chart? - No - copy requested No - copy requested No - copy requested No - copy requested - No - copy requested  Would patient like information on creating a medical advance directive? - No - Patient declined - - - - -  Pre-existing out of facility DNR order (yellow form or pink MOST form) - - - - - - -    Current Medications (verified) Outpatient Encounter Medications as of 03/17/2020  Medication Sig  . acetaminophen (TYLENOL) 500 MG tablet Take 1,000 mg by mouth 2 (two) times daily.   [name] allopurinol (ZYLOPRIM) 100 MG tablet TAKE 1 TABLET BY MOUTH EVERY DAY  . atorvastatin (LIPITOR) 40 MG tablet Take 1 and a half tablet daily by mouth  (60mg )  . Cholecalciferol (VITAMIN D3) 125 MCG (5000 UT) CAPS Take 5,000 Units by mouth daily.  .  Coenzyme Q10 (COQ10) 100 MG CAPS Take 300 mg by mouth daily.   [name] CRANBERRY PO Take 1 capsule by mouth 2 (two) times a day.  [name] ELIQUIS 5 MG TABS tablet TAKE 1 TABLET BY MOUTH TWICE A DAY  . furosemide (LASIX) 20 MG tablet TAKE 1 TABLET BY MOUTH TWICE A DAY  . gabapentin (NEURONTIN) 300 MG capsule 1 cap po bid and 2 caps po qhs  . HEMATINIC/FOLIC ACID 973-5 MG TABS TAKE 1 TABLET BY MOUTH EVERY DAY (Patient taking differently: 2 (two) times a week. )  . levocetirizine (XYZAL) 5 MG tablet Take 1 tablet (5 mg total) by mouth every evening.  . Misc Natural Products (TART CHERRY ADVANCED PO) Take 1,200 mg by mouth 2 (two) times a day.   . Multiple Vitamins-Minerals (CENTRUM SILVER PO) Take 1 tablet by mouth daily.  . Phenazopyridine HCl (AZO-STANDARD PO) Take by mouth.  . polyethylene glycol (MIRALAX / GLYCOLAX) packet Take 17 g by mouth daily.  . sennosides-docusate sodium (SENOKOT-S) 8.6-50 MG tablet Take 1 tablet by mouth daily.   . vitamin C (ASCORBIC ACID) 500 MG tablet Take 500 mg by mouth 2 (two) times daily.   [name] albuterol (VENTOLIN HFA) 108 (90 Base) MCG/ACT inhaler Inhale 2 puffs into the lungs every 6 (six) hours as needed. (Patient not taking: Reported on 03/17/2020)  . montelukast (SINGULAIR) 10 MG tablet Take 1 tablet (10 mg total) by mouth at bedtime. (Patient not taking: Reported on  03/17/2020)   No facility-administered encounter medications on file as of 03/17/2020.    Allergies (verified) Coumadin [warfarin sodium]   History: Past Medical History:  Diagnosis Date  . 1st degree AV block 11/09/2018   Noted on EKG   . Anemia   . Aortic stenosis, severe    S/p Edwards Sapien 3 Transcatheter Heart Valve (size 26 mm, model # [id number], serial # [id number])  . Arthritis   . Back pain   . Bursitis   . Cataract    left immature  . Cellulitis 10/12/2015  . CHF (congestive heart failure) ([place])   . Complication of anesthesia    Halucinations  . Constipation    takes Miralax daily as  well as Senokot daily  . DDD (degenerative disc disease)   . Gout    takes Allopurinol daily  . Heart valve disorder   . History of blood clots 1962   knee  . History of blood transfusion    no abnormal reaction noted  . History of shingles   . Hyperlipidemia    takes Atorvastatin daily  . Hypertension    takes Lisinopril daily  . Joint pain   . Low back pain 01/25/2017  . Morbid obesity ([place])   . Neck pain on left side 01/25/2017  . OSA (obstructive sleep apnea)   . Osteoarthritis   . Peripheral edema    takes Furosemide daily  . Peripheral neuropathy    takes Gabapentin daily  . Peroneal palsy    significant right foot drop  . Persistent atrial fibrillation ([place])   . Pneumonia 25+yrs ago   hx of  . Prostate cancer ([place])   . Prostate cancer ([place]) 02/13/2019  . RBBB 11/09/2018   Noted on EKG  . Shortness of breath   . Swelling of extremity   . Thrombocytopenia ([place])   . Urinary frequency   . Urinary urgency   . Valvular heart disease   . Venous stasis dermatitis    Past Surgical History:  Procedure Laterality Date  . CARDIAC CATHETERIZATION N/A 05/02/2015   Procedure: Right/Left Heart Cath and Coronary Angiography;  Surgeon: Burnell Blanks, MD;  Location: [place] [facility];  Service: Cardiovascular;  Laterality: N/A;  . CARDIOVERSION N/A 10/27/2018   Procedure: CARDIOVERSION;  Surgeon: Fay Records, MD;  Location: [facility] ENDOSCOPY;  Service: Cardiovascular;  Laterality: N/A;  . COLONOSCOPY N/A 02/07/2013   Procedure: COLONOSCOPY;  Surgeon: Juanita Craver, MD;  Location: [facility] ENDOSCOPY;  Service: Endoscopy;  Laterality: N/A;  . COLONOSCOPY N/A 02/09/2013   Procedure: COLONOSCOPY;  Surgeon: Beryle Beams, MD;  Location: [place];  Service: Endoscopy;  Laterality: N/A;  . ESOPHAGOGASTRODUODENOSCOPY N/A 02/07/2013   Procedure: ESOPHAGOGASTRODUODENOSCOPY (EGD);  Surgeon: Juanita Craver, MD;  Location: [facility] ENDOSCOPY;  Service: Endoscopy;  Laterality: N/A;  . GIVENS CAPSULE STUDY  N/A 02/09/2013   Procedure: GIVENS CAPSULE STUDY;  Surgeon: Beryle Beams, MD;  Location: [place];  Service: Endoscopy;  Laterality: N/A;  . HERNIA REPAIR     umbilical hernia  . KNEE SURGERY Right    multiple knee surgeries due to complication of R TKA  . LAMINOTOMY  1884   c6-t2  . PACEMAKER IMPLANT N/A 04/27/2018   Procedure: PACEMAKER IMPLANT;  Surgeon: Constance Haw, MD;  Location: [place] [facility];  Service: Cardiovascular;  Laterality: N/A;  . PROSTATE BIOPSY N/A 04/12/2019   Procedure: BIOPSY TRANSRECTAL ULTRASONIC PROSTATE (TUBP) CYSTOSCOPY;  Surgeon: [name] Hughs, MD;  Location: [facility];  Service: Urology;  Laterality: N/A;  . SPINE SURGERY    . TEE WITHOUT CARDIOVERSION N/A 03/07/2015   Procedure: TRANSESOPHAGEAL ECHOCARDIOGRAM (TEE);  Surgeon: Josue Hector, MD;  Location: [facility] ENDOSCOPY;  Service: Cardiovascular;  Laterality: N/A;  ANES TO BRING PROPOFOL PER DOCTOR  . TEE WITHOUT CARDIOVERSION N/A 06/10/2015   Procedure: TRANSESOPHAGEAL ECHOCARDIOGRAM (TEE);  Surgeon: Burnell Blanks, MD;  Location: [place];  Service: Open Heart Surgery;  Laterality: N/A;  . TOTAL KNEE ARTHROPLASTY     right   x4 left x 1   . TRANSCATHETER AORTIC VALVE REPLACEMENT, TRANSFEMORAL Left 06/10/2015   Procedure: TRANSCATHETER AORTIC VALVE REPLACEMENT, TRANSFEMORAL;  Surgeon: Burnell Blanks, MD;  Location: [place];  Service: Open Heart Surgery;  Laterality: Left;   Family History  Problem Relation Age of Onset  . Other Father        POSSIBLE HEART ATTACK  . Arthritis Sister        "crippling"  . Prostate cancer Brother   . Arthritis Sister   . Breast cancer Neg Hx   . Colon cancer Neg Hx    Social History   Socioeconomic History  . Marital status: Married    Spouse name: [name]  . Number of children: 3  . Years of education: Not on file  . Highest education level: Not on file  Occupational History  . Occupation: retired Nurse, mental health  . Smoking  status: Former Smoker    Years: 30.00    Types: Pipe    Quit date: 09/20/2006    Years since quitting: 13.4  . Smokeless tobacco: Never Used  Vaping Use  . Vaping Use: Never used  Substance and Sexual Activity  . Alcohol use: No    Alcohol/week: 0.0 standard drinks  . Drug use: No  . Sexual activity: Not Currently    Comment: lives with wife, retired, no dietary restrictions  Other Topics Concern  . Not on file  Social History Narrative  . Not on file   Social Determinants of Health   Financial Resource Strain: Low Risk   . Difficulty of Paying Living Expenses: Not hard at all  Food Insecurity: No Food Insecurity  . Worried About Charity fundraiser in the Last Year: Never true  . Ran Out of Food in the Last Year: Never true  Transportation Needs: No Transportation Needs  . Lack of Transportation (Medical): No  . Lack of Transportation (Non-Medical): No  Physical Activity:   . Days of Exercise per Week:   . Minutes of Exercise per Session:   Stress:   . Feeling of Stress :   Social Connections:   . Frequency of Communication with Friends and Family:   . Frequency of Social Gatherings with Friends and Family:   . Attends Religious Services:   . Active Member of Clubs or Organizations:   . Attends Archivist Meetings:   [name] Marital Status:     Tobacco Counseling Counseling given: Not Answered   Clinical Intake: Pain : No/denies pain     Activities of Daily Living In your present state of health, do you have any difficulty performing the following activities: 03/17/2020 04/11/2019  Hearing? N N  Vision? N N  Difficulty concentrating or making decisions? N N  Walking or climbing stairs? Y Y  Dressing or bathing? N N  Doing errands, shopping? Y N  Preparing Food and eating ? Y -  Using the Toilet? N -  In the  past six months, have you accidently leaked urine? N -  Do you have problems with loss of bowel control? N -  Managing your Medications? N -    Managing your Finances? Y -  Housekeeping or managing your Housekeeping? Y -  Some recent data might be hidden    Patient Care Team: [name], MD as PCP - General (Family Medicine) Constance Haw, MD as PCP - Electrophysiology (Cardiology) Josue Hector, MD as PCP - Cardiology (Cardiology) Josue Hector, MD as Consulting Physician (Cardiology) [name] Hughs, MD as Attending Physician (Urology) Cira Rue, RN Nurse Navigator as Registered Nurse (Medical Oncology)  Indicate any recent Medical Services you may have received from other than [facility] providers in the past year (date may be approximate).     Assessment:   This is a routine wellness examination for [place].  Dietary issues and exercise activities discussed: Current Exercise Habits: The patient does not participate in regular exercise at present, Exercise limited by: orthopedic condition(s) Diet (meal preparation, eat out, water intake, caffeinated beverages, dairy products, fruits and vegetables): well balanced   Goals    . Weight (lb) < 350 lb (158.8 kg)     Now see Dr.Beasly-follow diet and begin exercise      Depression Screen PHQ 2/9 Scores 03/17/2020 03/15/2019 03/28/2018 12/20/2017 12/06/2017 11/02/2016 07/29/2016  PHQ - 2 Score 0 0 0 0 1 0 0  PHQ- 9 Score - - - - 8 - -    Fall Risk Fall Risk  03/17/2020 03/15/2019 03/28/2018 12/20/2017 11/02/2016  Falls in the past year? 0 1 No No No  Number falls in past yr: 0 0 - - -  Injury with Fall? 0 0 - - -  Follow up Education provided;Falls prevention discussed - - - -   Lives w/ wife in 1 story home.   Any stairs in or around the home? Yes  If so, are there any without handrails? No  Home free of loose throw rugs in walkways, pet beds, electrical cords, etc? Yes  Adequate lighting in your home to reduce risk of falls? Yes   ASSISTIVE DEVICES UTILIZED TO PREVENT FALLS:  Life alert? No  Use of a cane, walker or w/c? uses wheelchair when out and walker at  home. Grab bars in the bathroom? No  Shower chair or bench in shower? No  Elevated toilet seat or a handicapped toilet? Yes    Cognitive Function: MMSE - Mini Mental State Exam 11/02/2016  Orientation to time 5  Orientation to Place 5  Registration 3  Attention/ Calculation 5  Recall 2  Language- name 2 objects 2  Language- repeat 1  Language- follow 3 step command 3  Language- read & follow direction 1  Write a sentence 1  Copy design 1  Total score 29     6CIT Screen 03/17/2020  What Year? 0 points  What month? 0 points  What time? 0 points  Count back from 20 0 points  Months in reverse 0 points  Repeat phrase 4 points  Total Score 4    Immunizations Immunization History  Administered Date(s) Administered  . Influenza Split 07/08/2011, 06/20/2012  . Influenza, High Dose Seasonal PF 06/22/2016, 07/06/2017, 06/09/2018, 05/02/2019  . Influenza,inj,Quad PF,6+ Mos 06/07/2013, 07/29/2014, 07/18/2015  . Influenza-Unspecified 06/26/2017  . Moderna SARS-COVID-2 Vaccination 10/27/2019, 11/19/2019  . PPD Test 02/16/2013  . Pneumococcal Conjugate-13 10/11/2013  . Pneumococcal Polysaccharide-23 11/07/2015  . Td 08/26/2006, 03/28/2018  . Zoster 07/27/2013  .  Zoster Recombinat (Shingrix) 11/08/2018, 03/25/2019    TDAP status: Up to date Flu Vaccine status: Up to date Pneumococcal vaccine status: Up to date Covid-19 vaccine status: Completed vaccines  Qualifies for Shingles Vaccine?   Zostavax completed Yes   Shingrix Completed?: Yes  Screening Tests Health Maintenance  Topic Date Due  . Hepatitis C Screening  Never done  . INFLUENZA VACCINE  04/20/2020  . TETANUS/TDAP  03/28/2028  . COVID-19 Vaccine  Completed  . PNA vac Low Risk Adult  Completed    Health Maintenance  Health Maintenance Due  Topic Date Due  . Hepatitis C Screening  Never done    Pt declines colon cancer screening.   Lung Cancer Screening: (Low Dose CT Chest recommended if Age 24-80  years, 30 pack-year currently smoking OR have quit w/in 15years.) does not qualify.   Additional Screening:   Vision Screening: Recommended annual ophthalmology exams for early detection of glaucoma and other disorders of the eye. Is the patient up to date with their annual eye exam?  Yes  Who is the provider or what is the name of the office in which the patient attends annual eye exams? Dr.Sally Miller  Dental Screening: Recommended annual dental exams for proper oral hygiene  Community Resource Referral / Chronic Care Management: CRR required this visit?  No   CCM required this visit?  No      Plan:    Please schedule your next medicare wellness visit with me in 1 yr.  Continue to eat heart healthy diet (full of fruits, vegetables, whole grains, lean protein, water--limit salt, fat, and sugar intake) and increase physical activity as tolerated.  Continue doing brain stimulating activities (puzzles, reading, adult coloring books, staying active) to keep memory sharp.    I have personally reviewed and noted the following in the patient's chart:   . Medical and social history . Use of alcohol, tobacco or illicit drugs  . Current medications and supplements . Functional ability and status . Nutritional status . Physical activity . Advanced directives . List of other physicians . Hospitalizations, surgeries, and ER visits in previous 12 months . Vitals . Screenings to include cognitive, depression, and falls . Referrals and appointments  In addition, I have reviewed and discussed with patient certain preventive protocols, quality metrics, and best practice recommendations. A written personalized care plan for preventive services as well as general preventive health recommendations were provided to patient.     Shela Nevin, [place]   03/17/2020   Nurse Notes: Lives w/ wife and son in 1 story home. Only takes sink baths. Uses walker or wheelchair.

## 2020-03-17 ENCOUNTER — Ambulatory Visit (INDEPENDENT_AMBULATORY_CARE_PROVIDER_SITE_OTHER): Payer: Medicare HMO | Admitting: *Deleted

## 2020-03-17 ENCOUNTER — Other Ambulatory Visit: Payer: Self-pay

## 2020-03-17 ENCOUNTER — Encounter: Payer: Self-pay | Admitting: *Deleted

## 2020-03-17 VITALS — BP 115/62 | HR 64 | Temp 97.0°F | Ht 74.0 in | Wt 340.0 lb

## 2020-03-17 DIAGNOSIS — Z0000 Encounter for general adult medical examination without abnormal findings: Secondary | ICD-10-CM | POA: Diagnosis not present

## 2020-03-17 DIAGNOSIS — R062 Wheezing: Secondary | ICD-10-CM | POA: Diagnosis not present

## 2020-03-17 DIAGNOSIS — G4733 Obstructive sleep apnea (adult) (pediatric): Secondary | ICD-10-CM | POA: Diagnosis not present

## 2020-03-17 NOTE — Patient Instructions (Signed)
[name] , Thank you for taking time to come for your Medicare Wellness Visit. I appreciate your ongoing commitment to your health goals. Please review the following plan we discussed and let me know if I can assist you in the future.   Screening recommendations/referrals: Colonoscopy: Pt declined.  Recommended yearly ophthalmology/optometry visit for glaucoma screening and checkup Recommended yearly dental visit for hygiene and checkup  Vaccinations: TDAP status: Up to date Flu Vaccine status: Up to date Pneumococcal vaccine status: Up to date Covid-19 vaccine status: Completed vaccines  Advanced directives: on file  Next appointment: Follow up in one year for your annual wellness visit    Preventive Care 78 Years and Older, Male Preventive care refers to lifestyle choices and visits with your health care provider that can promote health and wellness. This includes:  A yearly physical exam. This is also called an annual well check.  Regular dental and eye exams.  Immunizations.  Screening for certain conditions.  Healthy lifestyle choices, such as diet and exercise. What can I expect for my preventive care visit? Physical exam Your health care provider will check:  Height and weight. These may be used to calculate body mass index (BMI), which is a measurement that tells if you are at a healthy weight.  Heart rate and blood pressure.  Your skin for abnormal spots. Counseling Your health care provider may ask you questions about:  Alcohol, tobacco, and drug use.  Emotional well-being.  Home and relationship well-being.  Sexual activity.  Eating habits.  History of falls.  Memory and ability to understand (cognition).  Work and work Statistician. What immunizations do I need?  Influenza (flu) vaccine  This is recommended every year. Tetanus, diphtheria, and pertussis (Tdap) vaccine  You may need a Td booster every 10 years. Varicella (chickenpox)  vaccine  You may need this vaccine if you have not already been vaccinated. Zoster (shingles) vaccine  You may need this after age 78. Pneumococcal conjugate (PCV13) vaccine  One dose is recommended after age 78. Pneumococcal polysaccharide (PPSV23) vaccine  One dose is recommended after age 78. Measles, mumps, and rubella (MMR) vaccine  You may need at least one dose of MMR if you were born in 1957 or later. You may also need a second dose. Meningococcal conjugate (MenACWY) vaccine  You may need this if you have certain conditions. Hepatitis A vaccine  You may need this if you have certain conditions or if you travel or work in places where you may be exposed to hepatitis A. Hepatitis B vaccine  You may need this if you have certain conditions or if you travel or work in places where you may be exposed to hepatitis B. Haemophilus influenzae type b (Hib) vaccine  You may need this if you have certain conditions. You may receive vaccines as individual doses or as more than one vaccine together in one shot (combination vaccines). Talk with your health care provider about the risks and benefits of combination vaccines. What tests do I need? Blood tests  Lipid and cholesterol levels. These may be checked every 5 years, or more frequently depending on your overall health.  Hepatitis C test.  Hepatitis B test. Screening  Lung cancer screening. You may have this screening every year starting at age 78 if you have a 30-pack-year history of smoking and currently smoke or have quit within the past 15 years.  Colorectal cancer screening. All adults should have this screening starting at age 78 and continuing until  age 13. until  age 13. Your health care provider may recommend screening at age 14 if you are at increased risk. You will have tests every 1-10 years, depending on your results and the type of screening test.  Prostate cancer screening. Recommendations will vary depending on your family  history and other risks.  Diabetes screening. This is done by checking your blood sugar (glucose) after you have not eaten for a while (fasting). You may have this done every 1-3 years.  Abdominal aortic aneurysm (AAA) screening. You may need this if you are a current or former smoker.  Sexually transmitted disease (STD) testing. Follow these instructions at home: Eating and drinking  Eat a diet that includes fresh fruits and vegetables, whole grains, lean protein, and low-fat dairy products. Limit your intake of foods with high amounts of sugar, saturated fats, and salt.  Take vitamin and mineral supplements as recommended by your health care provider.  Do not drink alcohol if your health care provider tells you not to drink.  If you drink alcohol: ? Limit how much you have to 0-2 drinks a day. ? Be aware of how much alcohol is in your drink. In the [place] one drink equals one 12 oz bottle of beer (355 mL), one 5 oz glass of wine (148 mL), or one 1 oz glass of hard liquor (44 mL). Lifestyle  Take daily care of your teeth and gums.  Stay active. Exercise for at least 30 minutes on 5 or more days each week.  Do not use any products that contain nicotine or tobacco, such as cigarettes, e-cigarettes, and chewing tobacco. If you need help quitting, ask your health care provider.  If you are sexually active, practice safe sex. Use a condom or other form of protection to prevent STIs (sexually transmitted infections).  Talk with your health care provider about taking a low-dose aspirin or statin. What's next?  Visit your health care provider once a year for a well check visit.  Ask your health care provider how often you should have your eyes and teeth checked.  Stay up to date on all vaccines. This information is not intended to replace advice given to you by your health care provider. Make sure you discuss any questions you have with your health care provider. Document Revised:  08/31/2018 Document Reviewed: 08/31/2018 Elsevier Patient Education  2020 [employer].

## 2020-03-21 DIAGNOSIS — R062 Wheezing: Secondary | ICD-10-CM | POA: Diagnosis not present

## 2020-03-21 DIAGNOSIS — G4733 Obstructive sleep apnea (adult) (pediatric): Secondary | ICD-10-CM | POA: Diagnosis not present

## 2020-03-21 DIAGNOSIS — J9622 Acute and chronic respiratory failure with hypercapnia: Secondary | ICD-10-CM | POA: Diagnosis not present

## 2020-03-21 DIAGNOSIS — J449 Chronic obstructive pulmonary disease, unspecified: Secondary | ICD-10-CM | POA: Diagnosis not present

## 2020-03-22 ENCOUNTER — Other Ambulatory Visit: Payer: Self-pay | Admitting: Medical

## 2020-03-23 ENCOUNTER — Other Ambulatory Visit: Payer: Self-pay | Admitting: Medical

## 2020-03-24 NOTE — Progress Notes (Signed)
Date:  03/25/2020   ID:  [name], DOB [birth year], MRN [id number]  Provider Location: Office  PCP:  [name], MD  Cardiologist:  Jenkins Rouge, MD   Electrophysiologist:  Constance Haw, MD   Evaluation Performed:  Follow-Up Visit  Chief Complaint:  TAVR/ PAF   History of Present Illness:    [name] is a 79 y.o. male with a h/o morbid obesity, severe aortic stenosis, s/p TAVR in 2016, PPM for AV block, 04/27/18. Was referred to the afib clinic after device clinic noted afib x 2 days. He has a CHA2DS2VASc score of at least 4 . Now on Eliquis and off ASA/Plavix He denies any awareness of afib, no alcohol, some caffeine. Wears cpap.   F/u Afib clinic 11/09/18. Patient underwent DCCV on 10/27/18 and developed acute hypoxia, likely multifactorial in setting of COPD and fluid overload. He was admitted to the hospital for diuresis. He has not missed any doses of his Eliquis. He has maintained NSR   Note he had no CAD at cath in 2016 prior to TAVR   Echo5/20/20  showed stable TAVR valve with moderate MS stable gradients EF 65% mean AV gradient 17 peak 28 mmhg   Diagnosed with prostate cancer seeing Dr [name] and [name]  Doing hormone Rx instead of radiation   Chronic back pain and started on Gabapentin by Dr Randel Pigg 03/12/20   Kind enough to bring me a " "[place]" hat today   The patient does not have symptoms concerning for COVID-19 infection (fever, chills, cough, or new shortness of breath).    Past Medical History:  Diagnosis Date  . 1st degree AV block 11/09/2018   Noted on EKG   . Anemia   . Aortic stenosis, severe    S/p Edwards Sapien 3 Transcatheter Heart Valve (size 26 mm, model # [id number], serial # [id number])  . Arthritis   . Back pain   . Bursitis   . Cataract    left immature  . Cellulitis 10/12/2015  . CHF (congestive heart failure) ([place])   . Complication of anesthesia    Halucinations  . Constipation    takes Miralax daily as well as  Senokot daily  . DDD (degenerative disc disease)   . Gout    takes Allopurinol daily  . Heart valve disorder   . History of blood clots 1962   knee  . History of blood transfusion    no abnormal reaction noted  . History of shingles   . Hyperlipidemia    takes Atorvastatin daily  . Hypertension    takes Lisinopril daily  . Joint pain   . Low back pain 01/25/2017  . Morbid obesity ([place])   . Neck pain on left side 01/25/2017  . OSA (obstructive sleep apnea)   . Osteoarthritis   . Peripheral edema    takes Furosemide daily  . Peripheral neuropathy    takes Gabapentin daily  . Peroneal palsy    significant right foot drop  . Persistent atrial fibrillation ([place])   . Pneumonia 25+yrs ago   hx of  . Prostate cancer ([place])   . Prostate cancer ([place]) 02/13/2019  . RBBB 11/09/2018   Noted on EKG  . Shortness of breath   . Swelling of extremity   . Thrombocytopenia ([place])   . Urinary frequency   . Urinary urgency   . Valvular heart disease   . Venous stasis dermatitis    Past Surgical History:  Procedure Laterality Date  . CARDIAC CATHETERIZATION N/A 05/02/2015   Procedure: Right/Left Heart Cath and Coronary Angiography;  Surgeon: Burnell Blanks, MD;  Location: [place] [facility];  Service: Cardiovascular;  Laterality: N/A;  . CARDIOVERSION N/A 10/27/2018   Procedure: CARDIOVERSION;  Surgeon: Fay Records, MD;  Location: [facility] ENDOSCOPY;  Service: Cardiovascular;  Laterality: N/A;  . COLONOSCOPY N/A 02/07/2013   Procedure: COLONOSCOPY;  Surgeon: Juanita Craver, MD;  Location: [facility] ENDOSCOPY;  Service: Endoscopy;  Laterality: N/A;  . COLONOSCOPY N/A 02/09/2013   Procedure: COLONOSCOPY;  Surgeon: Beryle Beams, MD;  Location: [place];  Service: Endoscopy;  Laterality: N/A;  . ESOPHAGOGASTRODUODENOSCOPY N/A 02/07/2013   Procedure: ESOPHAGOGASTRODUODENOSCOPY (EGD);  Surgeon: Juanita Craver, MD;  Location[facility] ENDOSCOPY;  Service: Endoscopy;  Laterality: N/A;  . GIVENS CAPSULE STUDY N/A  02/09/2013   Procedure: GIVENS CAPSULE STUDY;  Surgeon: Beryle Beams, MD;  Location: [place];  Service: Endoscopy;  Laterality: N/A;  . HERNIA REPAIR     umbilical hernia  . KNEE SURGERY Right    multiple knee surgeries due to complication of R TKA  . LAMINOTOMY  7342   c6-t2  . PACEMAKER IMPLANT N/A 04/27/2018   Procedure: PACEMAKER IMPLANT;  Surgeon: Constance Haw, MD;  Location: [place] [facility];  Service: Cardiovascular;  Laterality: N/A;  . PROSTATE BIOPSY N/A 04/12/2019   Procedure: BIOPSY TRANSRECTAL ULTRASONIC PROSTATE (TUBP) CYSTOSCOPY;  Surgeon: [name] Hughs, MD;  Location: [facility];  Service: Urology;  Laterality: N/A;  . SPINE SURGERY    . TEE WITHOUT CARDIOVERSION N/A 03/07/2015   Procedure: TRANSESOPHAGEAL ECHOCARDIOGRAM (TEE);  Surgeon: Josue Hector, MD;  Location: [facility] ENDOSCOPY;  Service: Cardiovascular;  Laterality: N/A;  ANES TO BRING PROPOFOL PER DOCTOR  . TEE WITHOUT CARDIOVERSION N/A 06/10/2015   Procedure: TRANSESOPHAGEAL ECHOCARDIOGRAM (TEE);  Surgeon: Burnell Blanks, MD;  Location: [place];  Service: Open Heart Surgery;  Laterality: N/A;  . TOTAL KNEE ARTHROPLASTY     right   x4 left x 1   . TRANSCATHETER AORTIC VALVE REPLACEMENT, TRANSFEMORAL Left 06/10/2015   Procedure: TRANSCATHETER AORTIC VALVE REPLACEMENT, TRANSFEMORAL;  Surgeon: Burnell Blanks, MD;  Location: [place];  Service: Open Heart Surgery;  Laterality: Left;     Current Meds  Medication Sig  . acetaminophen (TYLENOL) 500 MG tablet Take 1,000 mg by mouth 2 (two) times daily.   [name] allopurinol (ZYLOPRIM) 100 MG tablet TAKE 1 TABLET BY MOUTH EVERY DAY  . atorvastatin (LIPITOR) 40 MG tablet Take 1 and a half tablet daily by mouth  (44m)  . Cholecalciferol (VITAMIN D3) 125 MCG (5000 UT) CAPS Take 5,000 Units by mouth daily.  . Coenzyme Q10 (COQ10) 100 MG CAPS Take 300 mg by mouth daily.   .Marland KitchenCRANBERRY PO Take 1 capsule by mouth 2 (two) times a day.  .Marland KitchenELIQUIS 5 MG TABS tablet  TAKE 1 TABLET BY MOUTH TWICE A DAY  . furosemide (LASIX) 20 MG tablet TAKE 1 TABLET BY MOUTH TWICE A DAY  . gabapentin (NEURONTIN) 300 MG capsule 1 cap po bid and 2 caps po qhs  . HEMATINIC/FOLIC ACID 3876-8MG TABS TAKE 1 TABLET BY MOUTH EVERY DAY (Patient taking differently: 2 (two) times a week. )  . levocetirizine (XYZAL) 5 MG tablet Take 1 tablet (5 mg total) by mouth every evening.  . Misc Natural Products (TART CHERRY ADVANCED PO) Take 1,200 mg by mouth 2 (two) times a day.   . montelukast (SINGULAIR) 10  MG tablet Take 1 tablet (10 mg total) by mouth at bedtime.  . Multiple Vitamins-Minerals (CENTRUM SILVER PO) Take 1 tablet by mouth daily.  . Phenazopyridine HCl (AZO-STANDARD PO) Take by mouth.  . polyethylene glycol (MIRALAX / GLYCOLAX) packet Take 17 g by mouth daily.  . sennosides-docusate sodium (SENOKOT-S) 8.6-50 MG tablet Take 1 tablet by mouth daily.   . vitamin C (ASCORBIC ACID) 500 MG tablet Take 500 mg by mouth 2 (two) times daily.      Allergies:   Coumadin [warfarin sodium]   Social History   Tobacco Use  . Smoking status: Former Smoker    Years: 30.00    Types: Pipe    Quit date: 09/20/2006    Years since quitting: 13.5  . Smokeless tobacco: Never Used  Vaping Use  . Vaping Use: Never used  Substance Use Topics  . Alcohol use: No    Alcohol/week: 0.0 standard drinks  . Drug use: No     Family Hx: The patient's family history includes Arthritis in his sister and sister; Other in his father; Prostate cancer in his brother. There is no history of Breast cancer or Colon cancer.  ROS:   Please see the history of present illness.     All other systems reviewed and are negative.   Prior CV studies:   The following studies were reviewed today:  02/07/19    Labs/Other Tests and Data Reviewed:    EKG:  AV pacing   Recent Labs: 03/10/2020: ALT 21; BUN 25; Creatinine, Ser 1.11; Hemoglobin 12.1; Platelets 126.0; Potassium 4.0; Sodium 141; TSH 1.40   Recent  Lipid Panel Lab Results  Component Value Date/Time   CHOL 116 03/10/2020 02:01 PM   CHOL 140 08/22/2018 09:51 AM   TRIG 169.0 (H) 03/10/2020 02:01 PM   HDL 29.10 (L) 03/10/2020 02:01 PM   HDL 30 (L) 08/22/2018 09:51 AM   CHOLHDL 4 03/10/2020 02:01 PM   LDLCALC 53 03/10/2020 02:01 PM   LDLCALC 85 08/22/2018 09:51 AM   LDLDIRECT 85.0 07/28/2017 04:28 PM    Wt Readings from Last 3 Encounters:  03/17/20 (!) 340 lb (154.2 kg)  02/26/20 (!) 340 lb (154.2 kg)  10/25/19 (!) 340 lb (154.2 kg)     Objective:    Vital Signs:  BP 132/76   Pulse 64   Ht _0  (1.88 m)   SpO2 94%   BMI 43.65 kg/m    Affect appropriate Obese male  HEENT: normal Neck supple with no adenopathy JVP normal no bruits no thyromegaly Lungs clear with no wheezing and good diaphragmatic motion Heart:  S1/S2 no murmur, no rub, gallop or click PPM under left clavicle  Abdomen: benighn, BS positve, no tenderness, no AAA no bruit.  No HSM or HJR Distal pulses intact with no bruits Plus one bilateral edema Neuro non-focal Skin warm and dry No muscular weakness Chronic plus 3 LE edema with ichthyosis    ASSESSMENT & PLAN:    PAF:  Post [facility] 10/27/18 appears to be maintaining NSR continue eliquis CHADVASC 4 Most recent PaCEART reviewed 02/04/20 no PAF   TAVR:  26 mm Sapien 3 2016 April 27 2018 echo with EF 60-65% mean gradient 18 mmHg with no PVL noted 02/07/19  MVD:  Severe MAC with moderate functional MS mean gradient 13 mmHg MVA PT1/2 1.2 cm2 On lasix HR runs low with pacer back up consider adding beta blocker in future  HLD:  On statin labs with primary  PPM:  Post [employer] Jude PPM dual chamber 04/27/18 normal function f/u Camnitz  HTN: Well controlled.  Continue current medications and low sodium Dash type diet.  Prostate:  Elevated PSA diagnosed with prostate cancer Getting hormone Rx f/u Dr [name]    COVID-19 Education: The signs and symptoms of COVID-19 were discussed with the patient and how to seek  care for testing (follow up with PCP or arrange E-visit).  The importance of social distancing was discussed today.  Time:   Today, I have spent 30 minutes with the patient  Medication Adjustments/Labs and Tests Ordered: Current medicines are reviewed at length with the patient today.  Concerns regarding medicines are outlined above.   Tests Ordered: No orders of the defined types were placed in this encounter.   Medication Changes: No orders of the defined types were placed in this encounter.   Disposition:  Follow up in 6 months with EP/Camnitz and me in a year   Echo for mitral valve disease / TAVR now   Signed, Jenkins Rouge, MD  03/25/2020 9:55 AM    [place]

## 2020-03-25 ENCOUNTER — Encounter: Payer: Self-pay | Admitting: Cardiovascular Disease

## 2020-03-25 ENCOUNTER — Ambulatory Visit: Payer: Medicare HMO | Admitting: Cardiovascular Disease

## 2020-03-25 ENCOUNTER — Other Ambulatory Visit: Payer: Self-pay

## 2020-03-25 VITALS — BP 132/76 | HR 64 | Ht 74.0 in

## 2020-03-25 DIAGNOSIS — Z952 Presence of prosthetic heart valve: Secondary | ICD-10-CM

## 2020-03-25 DIAGNOSIS — I059 Rheumatic mitral valve disease, unspecified: Secondary | ICD-10-CM

## 2020-03-25 NOTE — Patient Instructions (Addendum)
Medication Instructions:  *If you need a refill on your cardiac medications before your next appointment, please call your pharmacy*  Lab Work: If you have labs (blood work) drawn today and your tests are completely normal, you will receive your results only by: . MyChart Message (if you have MyChart) OR . A paper copy in the mail If you have any lab test that is abnormal or we need to change your treatment, we will call you to review the results.  Testing/Procedures: Your physician has requested that you have an echocardiogram. Echocardiography is a painless test that uses sound waves to create images of your heart. It provides your doctor with information about the size and shape of your heart and how well your heart's chambers and valves are working. This procedure takes approximately one hour. There are no restrictions for this procedure.  Follow-Up: At [facility], you and your health needs are our priority.  As part of our continuing mission to provide you with exceptional heart care, we have created designated Provider Care Teams.  These Care Teams include your primary Cardiologist (physician) and Advanced Practice Providers (APPs -  Physician Assistants and Nurse Practitioners) who all work together to provide you with the care you need, when you need it.  We recommend signing up for the patient portal called "MyChart".  Sign up information is provided on this After Visit Summary.  MyChart is used to connect with patients for Virtual Visits (Telemedicine).  Patients are able to view lab/test results, encounter notes, upcoming appointments, etc.  Non-urgent messages can be sent to your provider as well.   To learn more about what you can do with MyChart, go to [email or web address].    Your next appointment:   12 month(s)  The format for your next appointment:   In Person  Provider:   You may see Peter Nishan, MD or one of the following Advanced Practice Providers on your  designated Care Team:    Lori Gerhardt, NP  Laura Ingold, NP  Jill McDaniel, NP  

## 2020-04-03 DIAGNOSIS — D485 Neoplasm of uncertain behavior of skin: Secondary | ICD-10-CM | POA: Diagnosis not present

## 2020-04-03 DIAGNOSIS — D692 Other nonthrombocytopenic purpura: Secondary | ICD-10-CM | POA: Diagnosis not present

## 2020-04-03 DIAGNOSIS — L821 Other seborrheic keratosis: Secondary | ICD-10-CM | POA: Diagnosis not present

## 2020-04-03 DIAGNOSIS — D044 Carcinoma in situ of skin of scalp and neck: Secondary | ICD-10-CM | POA: Diagnosis not present

## 2020-04-03 DIAGNOSIS — L853 Xerosis cutis: Secondary | ICD-10-CM | POA: Diagnosis not present

## 2020-04-03 DIAGNOSIS — L570 Actinic keratosis: Secondary | ICD-10-CM | POA: Diagnosis not present

## 2020-04-03 DIAGNOSIS — Z85828 Personal history of other malignant neoplasm of skin: Secondary | ICD-10-CM | POA: Diagnosis not present

## 2020-04-09 DIAGNOSIS — J9622 Acute and chronic respiratory failure with hypercapnia: Secondary | ICD-10-CM | POA: Diagnosis not present

## 2020-04-09 DIAGNOSIS — J449 Chronic obstructive pulmonary disease, unspecified: Secondary | ICD-10-CM | POA: Diagnosis not present

## 2020-04-09 DIAGNOSIS — R062 Wheezing: Secondary | ICD-10-CM | POA: Diagnosis not present

## 2020-04-09 DIAGNOSIS — G4733 Obstructive sleep apnea (adult) (pediatric): Secondary | ICD-10-CM | POA: Diagnosis not present

## 2020-04-14 ENCOUNTER — Telehealth: Payer: Self-pay

## 2020-04-14 NOTE — Telephone Encounter (Signed)
Nurse Assessment Nurse: Valentino Nose, RN, [place] Date/Time (Eastern Time): 04/13/2020 8:23:19 AM Confirm and document reason for call. If symptomatic, describe symptoms. ---Caller states he woke up with back pain yesterday and is having what feels like electric shots in his back. Unable to lift left leg without the shooting pains in his back. Rates pain 10/10 Has the patient had close contact with a person known or suspected to have the novel coronavirus illness OR traveled / lives in area with major community spread (including international travel) in the last 14 days from the onset of symptoms? * If Asymptomatic, screen for exposure and travel within the last 14 days. ---No Does the patient have any new or worsening symptoms? ---Yes Will a triage be completed? ---Yes Related visit to physician within the last 2 weeks? ---Yes Does the PT have any chronic conditions? (i.e. diabetes, asthma, this includes High risk factors for pregnancy, etc.) ---Yes List chronic conditions. ---prostate cancer, pacemaker, heart valve Is this a behavioral health or substance abuse call? ---No Guidelines Guideline Title Affirmed Question Affirmed Notes Nurse Date/Time (Eastern Time) Back Pain High-risk adult (e.g., history of cancer, HIV, or IV drug use) [place], RN, [place] 04/13/2020 8:25:06 AM Disp. Time [place]il[place] Ghazi Time) Disposition Final User 04/13/2020 8:30:34 AM See PCP within 24 Hours Yes Valentino Nose, RN, [place] PLEASE NOTE: All timestamps contained within this report are represented as [place] Standard Time. CONFIDENTIALTY NOTICE: This fax transmission is intended only for the addressee. It contains information that is legally privileged, confidential or otherwise protected from use or disclosure. If you are not the intended recipient, you are strictly prohibited from reviewing, disclosing, copying using or disseminating any of this information or taking any action in reliance on or regarding this  information. If you have received this fax in error, please notify us immediately by telephone so that we can arrange for its return to [name]. Phone: [phone], Toll-Free: [phone], Fax: [phone] Page: 2 of 2 Call Id: [id number] [place] Disagree/Comply Comply Caller Understands Yes PreDisposition Home Care Care Advice Given Per Guideline SEE PCP WITHIN 24 HOURS: * IF OFFICE WILL BE OPEN: You need to be examined within the next 24 hours. Call your doctor (or NP/PA) when the office opens and make an appointment. * For pain relief, you can take either acetaminophen, ibuprofen, or naproxen. CALL BACK IF: * You become worse. CARE ADVICE given per Back Pain (Adult) guideline. Referrals REFERRED TO PCP OFFICE

## 2020-04-14 NOTE — Telephone Encounter (Signed)
Patient stated that he feels better.  He had taken some old oxycodone that he had in his drawer.  He stated that if it flares back up he will call and make an appointment.

## 2020-04-15 ENCOUNTER — Emergency Department (HOSPITAL_BASED_OUTPATIENT_CLINIC_OR_DEPARTMENT_OTHER)
Admission: EM | Admit: 2020-04-15 | Discharge: 2020-04-16 | Disposition: A | Discharge status: Home / Self Care | Payer: Medicare HMO | Source: Home / Self Care | Attending: Emergency Medicine | Admitting: Emergency Medicine

## 2020-04-15 ENCOUNTER — Other Ambulatory Visit: Payer: Self-pay

## 2020-04-15 ENCOUNTER — Telehealth (HOSPITAL_COMMUNITY): Payer: Self-pay | Admitting: Cardiovascular Disease

## 2020-04-15 ENCOUNTER — Emergency Department (HOSPITAL_BASED_OUTPATIENT_CLINIC_OR_DEPARTMENT_OTHER): Payer: Medicare HMO

## 2020-04-15 ENCOUNTER — Telehealth (INDEPENDENT_AMBULATORY_CARE_PROVIDER_SITE_OTHER): Payer: Medicare HMO | Admitting: Internal Medicine

## 2020-04-15 ENCOUNTER — Encounter (HOSPITAL_BASED_OUTPATIENT_CLINIC_OR_DEPARTMENT_OTHER): Payer: Self-pay

## 2020-04-15 ENCOUNTER — Encounter: Payer: Self-pay | Admitting: Internal Medicine

## 2020-04-15 VITALS — Ht 74.0 in | Wt 340.0 lb

## 2020-04-15 DIAGNOSIS — J9690 Respiratory failure, unspecified, unspecified whether with hypoxia or hypercapnia: Secondary | ICD-10-CM | POA: Diagnosis not present

## 2020-04-15 DIAGNOSIS — Z79899 Other long term (current) drug therapy: Secondary | ICD-10-CM | POA: Insufficient documentation

## 2020-04-15 DIAGNOSIS — R4781 Slurred speech: Secondary | ICD-10-CM | POA: Diagnosis not present

## 2020-04-15 DIAGNOSIS — I509 Heart failure, unspecified: Secondary | ICD-10-CM | POA: Insufficient documentation

## 2020-04-15 DIAGNOSIS — J449 Chronic obstructive pulmonary disease, unspecified: Secondary | ICD-10-CM | POA: Insufficient documentation

## 2020-04-15 DIAGNOSIS — E8729 Other acidosis: Secondary | ICD-10-CM

## 2020-04-15 DIAGNOSIS — R42 Dizziness and giddiness: Secondary | ICD-10-CM

## 2020-04-15 DIAGNOSIS — R0602 Shortness of breath: Secondary | ICD-10-CM | POA: Insufficient documentation

## 2020-04-15 DIAGNOSIS — E872 Acidosis: Secondary | ICD-10-CM | POA: Diagnosis not present

## 2020-04-15 DIAGNOSIS — I517 Cardiomegaly: Secondary | ICD-10-CM | POA: Diagnosis not present

## 2020-04-15 DIAGNOSIS — C61 Malignant neoplasm of prostate: Secondary | ICD-10-CM | POA: Insufficient documentation

## 2020-04-15 DIAGNOSIS — Z7901 Long term (current) use of anticoagulants: Secondary | ICD-10-CM | POA: Insufficient documentation

## 2020-04-15 DIAGNOSIS — I10 Essential (primary) hypertension: Secondary | ICD-10-CM | POA: Diagnosis not present

## 2020-04-15 DIAGNOSIS — Z8042 Family history of malignant neoplasm of prostate: Secondary | ICD-10-CM | POA: Insufficient documentation

## 2020-04-15 DIAGNOSIS — R4701 Aphasia: Secondary | ICD-10-CM | POA: Diagnosis not present

## 2020-04-15 DIAGNOSIS — I130 Hypertensive heart and chronic kidney disease with heart failure and stage 1 through stage 4 chronic kidney disease, or unspecified chronic kidney disease: Secondary | ICD-10-CM | POA: Diagnosis not present

## 2020-04-15 DIAGNOSIS — I110 Hypertensive heart disease with heart failure: Secondary | ICD-10-CM | POA: Insufficient documentation

## 2020-04-15 DIAGNOSIS — Z87891 Personal history of nicotine dependence: Secondary | ICD-10-CM | POA: Insufficient documentation

## 2020-04-15 DIAGNOSIS — J811 Chronic pulmonary edema: Secondary | ICD-10-CM | POA: Diagnosis not present

## 2020-04-15 LAB — CBC WITH DIFFERENTIAL/PLATELET
Abs Immature Granulocytes: 0.04 10*3/uL (ref 0.00–0.07)
Basophils Absolute: 0 10*3/uL (ref 0.0–0.1)
Basophils Relative: 0 %
Eosinophils Absolute: 0.1 10*3/uL (ref 0.0–0.5)
Eosinophils Relative: 1 %
HCT: 38.1 % — ABNORMAL LOW (ref 39.0–52.0)
Hemoglobin: 11.6 g/dL — ABNORMAL LOW (ref 13.0–17.0)
Immature Granulocytes: 1 %
Lymphocytes Relative: 14 %
Lymphs Abs: 1 10*3/uL (ref 0.7–4.0)
MCH: 31.5 pg (ref 26.0–34.0)
MCHC: 30.4 g/dL (ref 30.0–36.0)
MCV: 103.5 fL — ABNORMAL HIGH (ref 80.0–100.0)
Monocytes Absolute: 0.5 10*3/uL (ref 0.1–1.0)
Monocytes Relative: 7 %
Neutro Abs: 5.5 10*3/uL (ref 1.7–7.7)
Neutrophils Relative %: 77 %
Platelets: 150 10*3/uL (ref 150–400)
RBC: 3.68 MIL/uL — ABNORMAL LOW (ref 4.22–5.81)
RDW: 16.7 % — ABNORMAL HIGH (ref 11.5–15.5)
WBC: 7 10*3/uL (ref 4.0–10.5)
nRBC: 0.6 % — ABNORMAL HIGH (ref 0.0–0.2)

## 2020-04-15 LAB — BRAIN NATRIURETIC PEPTIDE: B Natriuretic Peptide: 737 pg/mL — ABNORMAL HIGH (ref 0.0–100.0)

## 2020-04-15 LAB — CBG MONITORING, ED: Glucose-Capillary: 98 mg/dL (ref 70–99)

## 2020-04-15 LAB — COMPREHENSIVE METABOLIC PANEL
ALT: 25 U/L (ref 0–44)
AST: 23 U/L (ref 15–41)
Albumin: 3.3 g/dL — ABNORMAL LOW (ref 3.5–5.0)
Alkaline Phosphatase: 58 U/L (ref 38–126)
Anion gap: 11 (ref 5–15)
BUN: 40 mg/dL — ABNORMAL HIGH (ref 8–23)
CO2: 35 mmol/L — ABNORMAL HIGH (ref 22–32)
Calcium: 8.9 mg/dL (ref 8.9–10.3)
Chloride: 93 mmol/L — ABNORMAL LOW (ref 98–111)
Creatinine, Ser: 1.49 mg/dL — ABNORMAL HIGH (ref 0.61–1.24)
GFR calc Af Amer: 52 mL/min — ABNORMAL LOW (ref >60)
GFR calc non Af Amer: 45 mL/min — ABNORMAL LOW (ref >60)
Glucose, Bld: 112 mg/dL — ABNORMAL HIGH (ref 70–99)
Potassium: 4.5 mmol/L (ref 3.5–5.1)
Sodium: 139 mmol/L (ref 135–145)
Total Bilirubin: 0.6 mg/dL (ref 0.3–1.2)
Total Protein: 6.4 g/dL — ABNORMAL LOW (ref 6.5–8.1)

## 2020-04-15 LAB — AMMONIA: Ammonia: 20 umol/L (ref 9–35)

## 2020-04-15 NOTE — ED Provider Notes (Signed)
[name] Provider Note   CSN: [id number] Arrival date & time: 04/15/20  1854     History Chief Complaint  Patient presents with  . Aphasia    [name] is a 79 y.o. male.  79 year old male with extensive history as below, on O2 at baseline, reports back pain Friday when he got out of bed and sat down on the toilet. Sharp pain in the back, had difficulty getting back to bed. Patient took left over oxycodone and rested through the weekend. Pain resolved by Sunday. Wife was concerned he was slurring speech and confusion during a crossword puzzle (wife took book away and patient could not recall what he was doing). Patient reports a headache at this time, none previously, also reports feeling short of breath, denies lower extremity edema. Denies unilateral weakness or numbness. Patient had a tele health visit this morning, PCP was concerned about the aphasia and reports of patient falling asleep frequently today and told him to go to the ER.  Patient's wife adds that earlier today he asked if he can have hotdogs, to then have a long argument and in the end the patient could not remember that he even asked for hotdogs.        Past Medical History:  Diagnosis Date  . 1st degree AV block 11/09/2018   Noted on EKG   . Anemia   . Aortic stenosis, severe    S/p Edwards Sapien 3 Transcatheter Heart Valve (size 26 mm, model # [id number], serial # [id number])  . Arthritis   . Back pain   . Bursitis   . Cataract    left immature  . Cellulitis 10/12/2015  . CHF (congestive heart failure) ([place])   . Complication of anesthesia    Halucinations  . Constipation    takes Miralax daily as well as Senokot daily  . DDD (degenerative disc disease)   . Gout    takes Allopurinol daily  . Heart valve disorder   . History of blood clots 1962   knee  . History of blood transfusion    no abnormal reaction noted  . History of shingles   . Hyperlipidemia    takes  Atorvastatin daily  . Hypertension    takes Lisinopril daily  . Joint pain   . Low back pain 01/25/2017  . Morbid obesity ([place])   . Neck pain on left side 01/25/2017  . OSA (obstructive sleep apnea)   . Osteoarthritis   . Peripheral edema    takes Furosemide daily  . Peripheral neuropathy    takes Gabapentin daily  . Peroneal palsy    significant right foot drop  . Persistent atrial fibrillation ([place])   . Pneumonia 25+yrs ago   hx of  . Prostate cancer ([place])   . Prostate cancer ([place]) 02/13/2019  . RBBB 11/09/2018   Noted on EKG  . Shortness of breath   . Swelling of extremity   . Thrombocytopenia ([place])   . Urinary frequency   . Urinary urgency   . Valvular heart disease   . Venous stasis dermatitis     Patient Active Problem List   Diagnosis Date Noted  . Chronic respiratory failure with hypoxia ([place]) 02/26/2020  . Coagulation defect ([place]) 12/07/2019  . Malignant neoplasm of prostate ([place]) 05/15/2019  . Pain due to onychomycosis of toenails of both feet 03/13/2019  . PVD (peripheral vascular disease) ([place]) 03/13/2019  . Prostate cancer ([place]) 02/13/2019  . Urinary  tract infection 12/16/2018  . Hypoxia 10/27/2018  . Persistent atrial fibrillation ([place]) 10/17/2018  . Primary osteoarthritis of left shoulder 08/23/2018  . Renal insufficiency 06/29/2018  . Bradycardia 04/26/2018  . Other fatigue 12/06/2017  . Shortness of breath on exertion 12/06/2017  . Essential hypertension 12/06/2017  . Hyperglycemia 07/28/2017  . Neck pain on left side 01/25/2017  . Low back pain 01/25/2017  . Constipation 08/15/2015  . Severe aortic valve stenosis   . Medicare annual wellness visit, subsequent 03/09/2015  . Preventative health care 03/09/2015  . Allergic rhinitis 12/02/2014  . COPD mixed type ([place]) 12/02/2014  . Chronic venous insufficiency 10/02/2013  . Venous stasis dermatitis 10/02/2013  . AVM (arteriovenous malformation) of colon with hemorrhage 02/10/2013  .  Thrombocytopenia, unspecified ([place]) 02/09/2013  . EDEMA 04/15/2010  . HYPERCHOLESTEROLEMIA 04/14/2010  . Gout 04/14/2010  . Anemia 04/14/2010  . Aortic valve disorder 04/14/2010  . VALVULAR HEART DISEASE 04/14/2010  . Osteoarthritis 04/14/2010  . Multilevel degenerative disc disease 04/14/2010  . BURSITIS 04/14/2010  . OBESITY, MORBID 03/21/2010  . Obstructive sleep apnea 02/19/2008    Past Surgical History:  Procedure Laterality Date  . CARDIAC CATHETERIZATION N/A 05/02/2015   Procedure: Right/Left Heart Cath and Coronary Angiography;  Surgeon: Burnell Blanks, MD;  Location: [place] [facility];  Service: Cardiovascular;  Laterality: N/A;  . CARDIOVERSION N/A 10/27/2018   Procedure: CARDIOVERSION;  Surgeon: Fay Records, MD;  Location: [facility] ENDOSCOPY;  Service: Cardiovascular;  Laterality: N/A;  . COLONOSCOPY N/A 02/07/2013   Procedure: COLONOSCOPY;  Surgeon: Juanita Craver, MD;  Location: [facility] ENDOSCOPY;  Service: Endoscopy;  Laterality: N/A;  . COLONOSCOPY N/A 02/09/2013   Procedure: COLONOSCOPY;  Surgeon: Beryle Beams, MD;  Location: [place];  Service: Endoscopy;  Laterality: N/A;  . ESOPHAGOGASTRODUODENOSCOPY N/A 02/07/2013   Procedure: ESOPHAGOGASTRODUODENOSCOPY (EGD);  Surgeon: Juanita Craver, MD;  Location: [facility] ENDOSCOPY;  Service: Endoscopy;  Laterality: N/A;  . GIVENS CAPSULE STUDY N/A 02/09/2013   Procedure: GIVENS CAPSULE STUDY;  Surgeon: Beryle Beams, MD;  Location: [place];  Service: Endoscopy;  Laterality: N/A;  . HERNIA REPAIR     umbilical hernia  . KNEE SURGERY Right    multiple knee surgeries due to complication of R TKA  . LAMINOTOMY  3267   c6-t2  . PACEMAKER IMPLANT N/A 04/27/2018   Procedure: PACEMAKER IMPLANT;  Surgeon: Constance Haw, MD;  Location: [place] [facility];  Service: Cardiovascular;  Laterality: N/A;  . PROSTATE BIOPSY N/A 04/12/2019   Procedure: BIOPSY TRANSRECTAL ULTRASONIC PROSTATE (TUBP) CYSTOSCOPY;  Surgeon: [name] Hughs,  MD;  Location: [facility] ORS;  Service: Urology;  Laterality: N/A;  . SPINE SURGERY    . TEE WITHOUT CARDIOVERSION N/A 03/07/2015   Procedure: TRANSESOPHAGEAL ECHOCARDIOGRAM (TEE);  Surgeon: Josue Hector, MD;  Location[facility] ENDOSCOPY;  Service: Cardiovascular;  Laterality: N/A;  ANES TO BRING PROPOFOL PER DOCTOR  . TEE WITHOUT CARDIOVERSION N/A 06/10/2015   Procedure: TRANSESOPHAGEAL ECHOCARDIOGRAM (TEE);  Surgeon: Burnell Blanks, MD;  Location: [place];  Service: Open Heart Surgery;  Laterality: N/A;  . TOTAL KNEE ARTHROPLASTY     right   x4 left x 1   . TRANSCATHETER AORTIC VALVE REPLACEMENT, TRANSFEMORAL Left 06/10/2015   Procedure: TRANSCATHETER AORTIC VALVE REPLACEMENT, TRANSFEMORAL;  Surgeon: Burnell Blanks, MD;  Location: [place];  Service: Open Heart Surgery;  Laterality: Left;       Family History  Problem Relation Age of Onset  . Other Father  POSSIBLE HEART ATTACK  . Arthritis Sister        "crippling"  . Prostate cancer Brother   . Arthritis Sister   . Breast cancer Neg Hx   . Colon cancer Neg Hx     Social History   Tobacco Use  . Smoking status: Former Smoker    Years: 30.00    Types: Pipe    Quit date: 09/20/2006    Years since quitting: 13.5  . Smokeless tobacco: Never Used  Vaping Use  . Vaping Use: Never used  Substance Use Topics  . Alcohol use: No    Alcohol/week: 0.0 standard drinks  . Drug use: No    Home Medications Prior to Admission medications   Medication Sig Start Date End Date Taking? Authorizing Provider  acetaminophen (TYLENOL) 500 MG tablet Take 1,000 mg by mouth 2 (two) times daily.     [provider]  allopurinol (ZYLOPRIM) 100 MG tablet TAKE 1 TABLET BY MOUTH EVERY DAY 02/12/20   [name], MD  atorvastatin (LIPITOR) 40 MG tablet Take 1 and a half tablet daily by mouth  (60mg ) 05/18/19   [name], MD  Cholecalciferol (VITAMIN D3) 125 MCG (5000 UT) CAPS Take 5,000 Units by mouth daily.    [provider]  Coenzyme Q10 (COQ10) 100 MG CAPS Take 300 mg by mouth daily.     [provider]  CRANBERRY PO Take 1 capsule by mouth 2 (two) times a day.    [provider]  ELIQUIS 5 MG TABS tablet TAKE 1 TABLET BY MOUTH TWICE A DAY 12/17/19   Sherran Needs, NP  furosemide (LASIX) 20 MG tablet TAKE 1 TABLET BY MOUTH TWICE A DAY 02/27/20   Josue Hector, MD  gabapentin (NEURONTIN) 300 MG capsule 1 cap po bid and 2 caps po qhs 03/06/20   [name], MD  HEMATINIC/FOLIC ACID 616-0 MG TABS TAKE 1 TABLET BY MOUTH EVERY DAY Patient taking differently: 2 (two) times a week.  01/02/20   [name], MD  levocetirizine (XYZAL) 5 MG tablet Take 1 tablet (5 mg total) by mouth every evening. 03/25/20   [name], MD  Misc Natural Products (TART CHERRY ADVANCED PO) Take 1,200 mg by mouth 2 (two) times a day.     [provider]  montelukast (SINGULAIR) 10 MG tablet Take 1 tablet (10 mg total) by mouth at bedtime. 03/25/20   [name], MD  Multiple Vitamins-Minerals (CENTRUM SILVER PO) Take 1 tablet by mouth daily.    [provider]  Phenazopyridine HCl (AZO-STANDARD PO) Take by mouth.    [provider]  polyethylene glycol (MIRALAX / GLYCOLAX) packet Take 17 g by mouth daily.    [provider]  sennosides-docusate sodium (SENOKOT-S) 8.6-50 MG tablet Take 1 tablet by mouth daily.     [provider]  vitamin C (ASCORBIC ACID) 500 MG tablet Take 500 mg by mouth 2 (two) times daily.     [provider]    Allergies    Coumadin [warfarin sodium]  Review of Systems   Review of Systems  Constitutional: Negative for chills and fever.  Eyes: Negative for visual disturbance.  Respiratory: Positive for shortness of breath.   Cardiovascular: Negative for chest pain.  Gastrointestinal: Negative for abdominal pain, constipation, diarrhea, nausea and vomiting.  Genitourinary: Negative for difficulty urinating and dysuria.   Musculoskeletal: Positive for back pain.  Skin: Negative for rash and wound.  Neurological: Positive for speech difficulty and headaches. Negative for dizziness and weakness.  Hematological: Bruises/bleeds easily.  Psychiatric/Behavioral: Positive for confusion.  All other systems reviewed and are negative.   Physical Exam Updated Vital Signs BP (!) 133/65 (BP Location: Right Arm)   Pulse 75   Temp 98.8 F (37.1 C) (Oral)   Resp 20   Ht 6\' 2"  (1.88 m)   Wt (!) 170.1 kg   SpO2 96%   BMI 48.15 kg/m   Physical Exam Vitals and nursing note reviewed.  Constitutional:      General: He is not in acute distress.    Appearance: He is well-developed. He is obese. He is not diaphoretic.  HENT:     Head: Normocephalic and atraumatic.     Mouth/Throat:     Mouth: Mucous membranes are moist.  Eyes:     Extraocular Movements: Extraocular movements intact.     Pupils: Pupils are equal, round, and reactive to light.  Cardiovascular:     Rate and Rhythm: Normal rate and regular rhythm.     Pulses: Normal pulses.     Heart sounds: Normal heart sounds.  Pulmonary:     Effort: Pulmonary effort is normal.     Breath sounds: Normal breath sounds.  Abdominal:     Palpations: Abdomen is soft.     Tenderness: There is no abdominal tenderness.  Musculoskeletal:     Right lower leg: No edema.     Left lower leg: No edema.  Skin:    General: Skin is warm and dry.     Findings: No erythema or rash.  Neurological:     Mental Status: He is alert and oriented to person, place, and time.  Psychiatric:        Mood and Affect: Mood normal.        Behavior: Behavior normal.        Thought Content: Thought content normal.     ED Results / Procedures / Treatments   Labs (all labs ordered are listed, but only abnormal results are displayed) Labs Reviewed  CBC WITH DIFFERENTIAL/PLATELET - Abnormal; Notable for the following components:      Result Value   RBC 3.68 (*)    Hemoglobin 11.6 (*)     HCT 38.1 (*)    MCV 103.5 (*)    RDW 16.7 (*)    nRBC 0.6 (*)    All other components within normal limits  COMPREHENSIVE METABOLIC PANEL - Abnormal; Notable for the following components:   Chloride 93 (*)    CO2 35 (*)    Glucose, Bld 112 (*)    BUN 40 (*)    Creatinine, Ser 1.49 (*)    Total Protein 6.4 (*)    Albumin 3.3 (*)    GFR calc non Af Amer 45 (*)    GFR calc Af Amer 52 (*)    All other components within normal limits  BRAIN NATRIURETIC PEPTIDE - Abnormal; Notable for the following components:   B Natriuretic Peptide 737.0 (*)    All other components within normal limits  AMMONIA  URINALYSIS, ROUTINE W REFLEX MICROSCOPIC  CBG MONITORING, ED  I-STAT ARTERIAL BLOOD GAS, ED    EKG None  Radiology CT Head Wo Contrast  Result Date: 04/15/2020 CLINICAL DATA:  Expressive aphasia EXAM: CT HEAD WITHOUT CONTRAST TECHNIQUE: Contiguous axial images were obtained from the base of the skull through the vertex without intravenous contrast. COMPARISON:  None. FINDINGS: Brain: No evidence of acute territorial  infarction, hemorrhage, hydrocephalus,extra-axial collection or mass lesion/mass effect. There is a hyperdense mass seen overlying the superior falx with internal calcifications measuring 2.4 x 1.8 cm, likely meningioma. No surrounding mass effect is seen. Normal gray-white differentiation. Ventricles are normal in size and contour. Vascular: No hyperdense vessel or unexpected calcification. Skull: The skull is intact. No fracture or focal lesion identified. Sinuses/Orbits: The visualized paranasal sinuses and mastoid air cells are clear. The orbits and globes intact. Other: None IMPRESSION: No acute intracranial abnormality. Probable partially calcified meningioma overlying the superior falx measuring 2.4 x 1.8 cm. Electronically Signed   By: Prudencio Pair M.D.   On: 04/15/2020 21:35   DG Chest Port 1 View  Result Date: 04/15/2020 CLINICAL DATA:  Expressive aphasia EXAM:  PORTABLE CHEST 1 VIEW COMPARISON:  October 27, 2018 FINDINGS: There is unchanged moderate cardiomegaly. There is prominence of the central pulmonary vasculature. A left-sided pacemaker is seen. No large airspace consolidation. No acute osseous abnormality IMPRESSION: Cardiomegaly and pulmonary vascular congestion Electronically Signed   By: Prudencio Pair M.D.   On: 04/15/2020 21:01    Procedures Procedures (including critical care time)  Medications Ordered in ED Medications - No data to display  ED Course  I have reviewed the triage vital signs and the nursing notes.  Pertinent labs & imaging results that were available during my care of the patient were reviewed by me and considered in my medical decision making (see chart for details).  Clinical Course as of Apr 15 2356  Tue Apr 16, 8447  1564 79 year old male brought in by wife for confusion for the past couple days, today with falling asleep for several minutes at a time.  Patient had a telehealth visit with PCP and was advised to come to the ER.  Patient and wife are frustrated, they were under the understanding that PCP would see them in the emergency room.  Patient was placed on oxygen while in the ER, wife states that his mental status has improved and he is more alert and awake.  Review of labs, ammonia levels normal, BNP 737, CBC without significant changes, CMP with slight increase in his creatinine, CO2 is 35, no significant change from prior.  CT head without findings to suggest CVA, probable partially calcified meningioma overlying superior falx.  Chest x-ray with cardiomegaly and pulmonary vascular congestion.  Discussed with Dr. Stark Jock, ER attending, ABG was added and shows a PCO2 of 90.2, PO2 of 87.  Suspect hypoventilation secondary to habitus.  Discussed BiPAP, patient declines.  Recommend admission to the hospital and patient declines stating that he will see his pulmonologist in the morning, states he is an active patient.  Discussed again with Dr. Stark Jock, patient refusing admission and would like to be discharged, patient will be discharged, return to ER as needed. Patient is alert and oriented, able to make his own health care decisions at this time. Discussed his health condition and concerns for decline in condition and possible death if not appropriately treated, patient verbalizes understanding.     [LM]    Clinical Course User Index [LM] Roque Lias   MDM Rules/Calculators/A&P                          Final Clinical Impression(s) / ED Diagnoses Final diagnoses:  CO2 retention    Rx / DC Orders ED Discharge Orders    None       Tacy Learn, PA-C 04/15/20 2357  Veryl Speak, MD 04/16/20 1539

## 2020-04-15 NOTE — Telephone Encounter (Signed)
I called patient to reschedule Echocardiogram and spoke with his wife.  She did not want to reschedule at this time due to him having more serious issues with his health.  She will call back if patient decides and is able to have. Order will be removed from the [place] and if calls back we can reinstate the order.

## 2020-04-15 NOTE — Progress Notes (Signed)
Subjective:    Patient ID: [name], male    DOB: 06-21-1941, 79 y.o.   MRN: [id number]  DOS:  04/15/2020 Type of visit - description: Virtual Visit via Video Note  I connected with the above patient  by a video enabled telemedicine application and verified that I am speaking with the correct person using two identifiers.   THIS ENCOUNTER IS A VIRTUAL VISIT DUE TO COVID-19 - PATIENT WAS NOT SEEN IN THE OFFICE. PATIENT HAS CONSENTED TO VIRTUAL VISIT / TELEMEDICINE VISIT   Location of patient: home  Location of provider: office  Persons participating in the virtual visit: patient, provider   I discussed the limitations of evaluation and management by telemedicine and the availability of in person appointments. The patient expressed understanding and agreed to proceed.  Acute The patient set up the visit for back pain but he has multiple issues.  He has a history of back pain years ago, he was essentially asymptomatic until 04/11/2020 when he tried to get up from the commode and developed severe low back pain, midline. He experienced it as sharp "electric shock" radiating to the left leg when he walked. He took some oxycodone leftover that he had at home, the last time he took  was 04/13/2020 (Sunday).  He woke up Monday and the pain was much improved however the family noted a number of other symptoms:  Lightheaded, slurred speech, falling asleep easily. They have not noted any facial drooping.  She has been able to ambulate around his house.  Additionally, he reports that his O2 sat at home today was 72% and now is 79%. He has a CPAP and nocturnal oxygen.  He denies any fever chills No rash on the back No bladder or bowel incontinence No lower extremity numbness or actual weakness.   Review of Systems See above   Past Medical History:  Diagnosis Date  . 1st degree AV block 11/09/2018   Noted on EKG   . Anemia   . Aortic stenosis, severe    S/p Edwards Sapien 3  Transcatheter Heart Valve (size 26 mm, model # [id number], serial # [id number])  . Arthritis   . Back pain   . Bursitis   . Cataract    left immature  . Cellulitis 10/12/2015  . CHF (congestive heart failure) ([place])   . Complication of anesthesia    Halucinations  . Constipation    takes Miralax daily as well as Senokot daily  . DDD (degenerative disc disease)   . Gout    takes Allopurinol daily  . Heart valve disorder   . History of blood clots 1962   knee  . History of blood transfusion    no abnormal reaction noted  . History of shingles   . Hyperlipidemia    takes Atorvastatin daily  . Hypertension    takes Lisinopril daily  . Joint pain   . Low back pain 01/25/2017  . Morbid obesity ([place])   . Neck pain on left side 01/25/2017  . OSA (obstructive sleep apnea)   . Osteoarthritis   . Peripheral edema    takes Furosemide daily  . Peripheral neuropathy    takes Gabapentin daily  . Peroneal palsy    significant right foot drop  . Persistent atrial fibrillation ([place])   . Pneumonia 25+yrs ago   hx of  . Prostate cancer ([place])   . Prostate cancer ([place]) 02/13/2019  . RBBB 11/09/2018   Noted on EKG  . Shortness  of breath   . Swelling of extremity   . Thrombocytopenia ([place])   . Urinary frequency   . Urinary urgency   . Valvular heart disease   . Venous stasis dermatitis     Past Surgical History:  Procedure Laterality Date  . CARDIAC CATHETERIZATION N/A 05/02/2015   Procedure: Right/Left Heart Cath and Coronary Angiography;  Surgeon: Burnell Blanks, MD;  Location: [place] [facility];  Service: Cardiovascular;  Laterality: N/A;  . CARDIOVERSION N/A 10/27/2018   Procedure: CARDIOVERSION;  Surgeon: Fay Records, MD;  Location: [facility] ENDOSCOPY;  Service: Cardiovascular;  Laterality: N/A;  . COLONOSCOPY N/A 02/07/2013   Procedure: COLONOSCOPY;  Surgeon: Juanita Craver, MD;  Location: [facility] ENDOSCOPY;  Service: Endoscopy;  Laterality: N/A;  . COLONOSCOPY N/A 02/09/2013   Procedure:  COLONOSCOPY;  Surgeon: Beryle Beams, MD;  Location: [place];  Service: Endoscopy;  Laterality: N/A;  . ESOPHAGOGASTRODUODENOSCOPY N/A 02/07/2013   Procedure: ESOPHAGOGASTRODUODENOSCOPY (EGD);  Surgeon: Juanita Craver, MD;  Location[facility] ENDOSCOPY;  Service: Endoscopy;  Laterality: N/A;  . GIVENS CAPSULE STUDY N/A 02/09/2013   Procedure: GIVENS CAPSULE STUDY;  Surgeon: Beryle Beams, MD;  Location: [place];  Service: Endoscopy;  Laterality: N/A;  . HERNIA REPAIR     umbilical hernia  . KNEE SURGERY Right    multiple knee surgeries due to complication of R TKA  . LAMINOTOMY  1610   c6-t2  . PACEMAKER IMPLANT N/A 04/27/2018   Procedure: PACEMAKER IMPLANT;  Surgeon: Constance Haw, MD;  Location: [place] [facility];  Service: Cardiovascular;  Laterality: N/A;  . PROSTATE BIOPSY N/A 04/12/2019   Procedure: BIOPSY TRANSRECTAL ULTRASONIC PROSTATE (TUBP) CYSTOSCOPY;  Surgeon: [name] Hughs, MD;  Location: [facility];  Service: Urology;  Laterality: N/A;  . SPINE SURGERY    . TEE WITHOUT CARDIOVERSION N/A 03/07/2015   Procedure: TRANSESOPHAGEAL ECHOCARDIOGRAM (TEE);  Surgeon: Josue Hector, MD;  Location: [facility] ENDOSCOPY;  Service: Cardiovascular;  Laterality: N/A;  ANES TO BRING PROPOFOL PER DOCTOR  . TEE WITHOUT CARDIOVERSION N/A 06/10/2015   Procedure: TRANSESOPHAGEAL ECHOCARDIOGRAM (TEE);  Surgeon: Burnell Blanks, MD;  Location: [place];  Service: Open Heart Surgery;  Laterality: N/A;  . TOTAL KNEE ARTHROPLASTY     right   x4 left x 1   . TRANSCATHETER AORTIC VALVE REPLACEMENT, TRANSFEMORAL Left 06/10/2015   Procedure: TRANSCATHETER AORTIC VALVE REPLACEMENT, TRANSFEMORAL;  Surgeon: Burnell Blanks, MD;  Location: [place];  Service: Open Heart Surgery;  Laterality: Left;    Allergies as of 04/15/2020      Reactions   Coumadin [warfarin Sodium] Other (See Comments)   States he can't be on this-bleeds out      Medication List       Accurate as of April 15, 2020  4:42 PM. If  you have any questions, ask your nurse or doctor.        acetaminophen 500 MG tablet Commonly known as: TYLENOL Take 1,000 mg by mouth 2 (two) times daily.   allopurinol 100 MG tablet Commonly known as: ZYLOPRIM TAKE 1 TABLET BY MOUTH EVERY DAY   atorvastatin 40 MG tablet Commonly known as: LIPITOR Take 1 and a half tablet daily by mouth  (60mg )   AZO-STANDARD PO Take by mouth.   CENTRUM SILVER PO Take 1 tablet by mouth daily.   CoQ10 100 MG Caps Take 300 mg by mouth daily.   CRANBERRY PO Take 1 capsule by mouth 2 (two) times a day.   Eliquis  5 MG Tabs tablet Generic drug: apixaban TAKE 1 TABLET BY MOUTH TWICE A DAY   furosemide 20 MG tablet Commonly known as: LASIX TAKE 1 TABLET BY MOUTH TWICE A DAY   gabapentin 300 MG capsule Commonly known as: NEURONTIN 1 cap po bid and 2 caps po qhs   Hematinic/Folic Acid 945-0 MG Tabs Generic drug: Ferrous Fumarate-Folic Acid TAKE 1 TABLET BY MOUTH EVERY DAY What changed:   how much to take  how to take this  when to take this   levocetirizine 5 MG tablet Commonly known as: XYZAL Take 1 tablet (5 mg total) by mouth every evening.   montelukast 10 MG tablet Commonly known as: SINGULAIR Take 1 tablet (10 mg total) by mouth at bedtime.   polyethylene glycol 17 g packet Commonly known as: MIRALAX / GLYCOLAX Take 17 g by mouth daily.   sennosides-docusate sodium 8.6-50 MG tablet Commonly known as: SENOKOT-S Take 1 tablet by mouth daily.   TART CHERRY ADVANCED PO Take 1,200 mg by mouth 2 (two) times a day.   vitamin C 500 MG tablet Commonly known as: ASCORBIC ACID Take 500 mg by mouth 2 (two) times daily.   Vitamin D3 125 MCG (5000 UT) Caps Take 5,000 Units by mouth daily.          Objective:   Physical Exam Ht 6\' 2"  (1.88 m)   Wt (!) 340 lb (154.2 kg)   BMI 43.65 kg/m  This is a virtual video visit, she seems alert oriented x3, speech fluent, face symmetric.    Assessment    79 year old  gentleman, PMH includes HTN, atrial fibrillation, aortic valve disease, chronic respiratory failure, COPD, sleep apnea, anticoagulated, presents with multiple symptoms:  Back pain: With sharp radiation to the left leg that actually is now resolved without red flag symptoms Lethargy, slurred speech, lightheadedness, hypoxia: Although the visit was initially set up for back pain he has a number of other symptoms. He does not look in distress He did show me his home monitor with a O2 sat of 78%.  On chart review, during the last 2 clinic visits, O2 sat has been well in the 90s. Advised patient that I cannot evaluate his symptoms via a virtual visit, recommend to be seen at the emergency room today due to hypoxia and slurred speech. He verbalized understanding.   I discussed the assessment and treatment plan with the patient. The patient was provided an opportunity to ask questions and all were answered. The patient agreed with the plan and demonstrated an understanding of the instructions.   The patient was advised to call back or seek an in-person evaluation if the symptoms worsen or if the condition fails to improve as anticipated.

## 2020-04-15 NOTE — ED Notes (Signed)
Patient placed

## 2020-04-15 NOTE — ED Notes (Signed)
Date and time results received: 04/15/20 2247 (use smartphrase ".now" to insert current time)  Test: ABG Critical Value: 90 PCO2  Name of Provider Notified: Percell Miller, [place]  Orders Received? Or Actions Taken?: none

## 2020-04-15 NOTE — ED Triage Notes (Signed)
Pt A&O and talkative in triage.

## 2020-04-15 NOTE — ED Notes (Signed)
PA student at bedside.

## 2020-04-15 NOTE — ED Notes (Signed)
Patient placed on 3L [place] with humidity oxygen saturations of 94-95%. Will continue to monitor as needed.

## 2020-04-15 NOTE — ED Triage Notes (Signed)
C/o expressive aphasia since Friday. Wears  2L n/c at home, was not wearing upon arrival. 100% on 4 L

## 2020-04-15 NOTE — ED Notes (Signed)
Physically resulted ABG results to PA, Percell Miller due to [employer] not able to download results to patient chart due to computer issues.

## 2020-04-15 NOTE — Medical Student Note (Signed)
[facility] DEPT [facility] Provider Student Note For educational purposes for Medical, PA and NP students only and not part of the legal medical record.   CSN: [id number] Arrival date & time: 04/15/20  1854      History   Chief Complaint Chief Complaint  Patient presents with   Aphasia    HPI [name] is a 79 y.o. male with an extensive MHx. He's on oxygen and normally uses it only at night. Uses a CPAP. He is here today due to a complaint of back pain and aphasia. On Friday, he had an episode of extreme pain in his lower back pain when he sat down on the toilet. He felt "electric shocks" in his left leg. He took an old oxycodone pill. Only feels the pain when he lifts the foot off of the ground. Continued taking old oxycodone pills. Had no pain on Sunday. His wife expressed concern that he was slurring his words and not responding appropriately. Had a virtual visit with his PCP. PCP instructed him to come in due to aphasia. Uses  Endorses an episode of memory loss (was working on a crossword puzzle book and when wife removed the book from his hand, he couldn't remember what he was doing). Currently has a new headache.   Denies any paralysis, weakness, facial dropping. No chest pain.   HPI    Past Medical History:  Diagnosis Date   1st degree AV block 11/09/2018   Noted on EKG    Anemia    Aortic stenosis, severe    S/p Edwards Sapien 3 Transcatheter Heart Valve (size 26 mm, model # [id number], serial # [id number])   Arthritis    Back pain    Bursitis    Cataract    left immature   Cellulitis 10/12/2015   CHF (congestive heart failure) ([facility])    Complication of anesthesia    Halucinations   Constipation    takes Miralax daily as well as Senokot daily   DDD (degenerative disc disease)    Gout    takes Allopurinol daily   Heart valve disorder    History of blood clots 1962   knee   History of blood transfusion    no abnormal reaction noted   History  of shingles    Hyperlipidemia    takes Atorvastatin daily   Hypertension    takes Lisinopril daily   Joint pain    Low back pain 01/25/2017   Morbid obesity ([place])    Neck pain on left side 01/25/2017   OSA (obstructive sleep apnea)    Osteoarthritis    Peripheral edema    takes Furosemide daily   Peripheral neuropathy    takes Gabapentin daily   Peroneal palsy    significant right foot drop   Persistent atrial fibrillation ([facility])    Pneumonia 25+yrs ago   hx of   Prostate cancer ([place])    Prostate cancer ([place]) 02/13/2019   RBBB 11/09/2018   Noted on EKG   Shortness of breath    Swelling of extremity    Thrombocytopenia ([place])    Urinary frequency    Urinary urgency    Valvular heart disease    Venous stasis dermatitis     Patient Active Problem List   Diagnosis Date Noted   Chronic respiratory failure with hypoxia ([place]) 02/26/2020   Coagulation defect ([place]) 12/07/2019   Malignant neoplasm of prostate ([place]) 05/15/2019   Pain due to onychomycosis of toenails of both feet  03/13/2019   PVD (peripheral vascular disease) ([place]) 03/13/2019   Prostate cancer ([place]) 02/13/2019   Urinary tract infection 12/16/2018   Hypoxia 10/27/2018   Persistent atrial fibrillation ([place]) 10/17/2018   Primary osteoarthritis of left shoulder 08/23/2018   Renal insufficiency 06/29/2018   Bradycardia 04/26/2018   Other fatigue 12/06/2017   Shortness of breath on exertion 12/06/2017   Essential hypertension 12/06/2017   Hyperglycemia 07/28/2017   Neck pain on left side 01/25/2017   Low back pain 01/25/2017   Constipation 08/15/2015   Severe aortic valve stenosis    Medicare annual wellness visit, subsequent 03/09/2015   Preventative health care 03/09/2015   Allergic rhinitis 12/02/2014   COPD mixed type ([place]) 12/02/2014   Chronic venous insufficiency 10/02/2013   Venous stasis dermatitis 10/02/2013   AVM (arteriovenous malformation) of colon with  hemorrhage 02/10/2013   Thrombocytopenia, unspecified ([place]) 02/09/2013   EDEMA 04/15/2010   HYPERCHOLESTEROLEMIA 04/14/2010   Gout 04/14/2010   Anemia 04/14/2010   Aortic valve disorder 04/14/2010   VALVULAR HEART DISEASE 04/14/2010   Osteoarthritis 04/14/2010   Multilevel degenerative disc disease 04/14/2010   BURSITIS 04/14/2010   OBESITY, MORBID 03/21/2010   Obstructive sleep apnea 02/19/2008    Past Surgical History:  Procedure Laterality Date   CARDIAC CATHETERIZATION N/A 05/02/2015   Procedure: Right/Left Heart Cath and Coronary Angiography;  Surgeon: Burnell Blanks, MD;  Location: [place] [facility];  Service: Cardiovascular;  Laterality: N/A;   CARDIOVERSION N/A 10/27/2018   Procedure: CARDIOVERSION;  Surgeon: Fay Records, MD;  Location: [facility] ENDOSCOPY;  Service: Cardiovascular;  Laterality: N/A;   COLONOSCOPY N/A 02/07/2013   Procedure: COLONOSCOPY;  Surgeon: Juanita Craver, MD;  Location: [facility] ENDOSCOPY;  Service: Endoscopy;  Laterality: N/A;   COLONOSCOPY N/A 02/09/2013   Procedure: COLONOSCOPY;  Surgeon: Beryle Beams, MD;  Location: [place];  Service: Endoscopy;  Laterality: N/A;   ESOPHAGOGASTRODUODENOSCOPY N/A 02/07/2013   Procedure: ESOPHAGOGASTRODUODENOSCOPY (EGD);  Surgeon: Juanita Craver, MD;  Location: [facility] ENDOSCOPY;  Service: Endoscopy;  Laterality: N/A;   GIVENS CAPSULE STUDY N/A 02/09/2013   Procedure: GIVENS CAPSULE STUDY;  Surgeon: Beryle Beams, MD;  Location: [place];  Service: Endoscopy;  Laterality: N/A;   HERNIA REPAIR     umbilical hernia   KNEE SURGERY Right    multiple knee surgeries due to complication of R TKA   LAMINOTOMY  1193   c6-t2   PACEMAKER IMPLANT N/A 04/27/2018   Procedure: PACEMAKER IMPLANT;  Surgeon: Constance Haw, MD;  Location: [place] [facility];  Service: Cardiovascular;  Laterality: N/A;   PROSTATE BIOPSY N/A 04/12/2019   Procedure: BIOPSY TRANSRECTAL ULTRASONIC PROSTATE (TUBP) CYSTOSCOPY;   Surgeon: [name] Hughs, MD;  [facility];  Service: Urology;  Laterality: N/A;   SPINE SURGERY     TEE WITHOUT CARDIOVERSION N/A 03/07/2015   Procedure: TRANSESOPHAGEAL ECHOCARDIOGRAM (TEE);  Surgeon: Josue Hector, MD;  Location: [facility] ENDOSCOPY;  Service: Cardiovascular;  Laterality: N/A;  ANES TO BRING PROPOFOL PER DOCTOR   TEE WITHOUT CARDIOVERSION N/A 06/10/2015   Procedure: TRANSESOPHAGEAL ECHOCARDIOGRAM (TEE);  Surgeon: Burnell Blanks, MD;  Location: [place];  Service: Open Heart Surgery;  Laterality: N/A;   TOTAL KNEE ARTHROPLASTY     right   x4 left x 1    TRANSCATHETER AORTIC VALVE REPLACEMENT, TRANSFEMORAL Left 06/10/2015   Procedure: TRANSCATHETER AORTIC VALVE REPLACEMENT, TRANSFEMORAL;  Surgeon: Burnell Blanks, MD;  Location: [place];  Service: Open Heart Surgery;  Laterality: Left;  Home Medications    Prior to Admission medications   Medication Sig Start Date End Date Taking? Authorizing Provider  acetaminophen (TYLENOL) 500 MG tablet Take 1,000 mg by mouth 2 (two) times daily.     [provider]  allopurinol (ZYLOPRIM) 100 MG tablet TAKE 1 TABLET BY MOUTH EVERY DAY 02/12/20   [name], MD  atorvastatin (LIPITOR) 40 MG tablet Take 1 and a half tablet daily by mouth  (60mg ) 05/18/19   [name], MD  Cholecalciferol (VITAMIN D3) 125 MCG (5000 UT) CAPS Take 5,000 Units by mouth daily.    [provider]  Coenzyme Q10 (COQ10) 100 MG CAPS Take 300 mg by mouth daily.     [provider]  CRANBERRY PO Take 1 capsule by mouth 2 (two) times a day.    [provider]  ELIQUIS 5 MG TABS tablet TAKE 1 TABLET BY MOUTH TWICE A DAY 12/17/19   Sherran Needs, NP  furosemide (LASIX) 20 MG tablet TAKE 1 TABLET BY MOUTH TWICE A DAY 02/27/20   Josue Hector, MD  gabapentin (NEURONTIN) 300 MG capsule 1 cap po bid and 2 caps po qhs 03/06/20   [name], MD  HEMATINIC/FOLIC ACID 725-3 MG TABS TAKE 1 TABLET BY MOUTH  EVERY DAY Patient taking differently: 2 (two) times a week.  01/02/20   [name], MD  levocetirizine (XYZAL) 5 MG tablet Take 1 tablet (5 mg total) by mouth every evening. 03/25/20   [name], MD  Misc Natural Products (TART CHERRY ADVANCED PO) Take 1,200 mg by mouth 2 (two) times a day.     [provider]  montelukast (SINGULAIR) 10 MG tablet Take 1 tablet (10 mg total) by mouth at bedtime. 03/25/20   [name], MD  Multiple Vitamins-Minerals (CENTRUM SILVER PO) Take 1 tablet by mouth daily.    [provider]  Phenazopyridine HCl (AZO-STANDARD PO) Take by mouth.    [provider]  polyethylene glycol (MIRALAX / GLYCOLAX) packet Take 17 g by mouth daily.    [provider]  sennosides-docusate sodium (SENOKOT-S) 8.6-50 MG tablet Take 1 tablet by mouth daily.     [provider]  vitamin C (ASCORBIC ACID) 500 MG tablet Take 500 mg by mouth 2 (two) times daily.     [provider]    Family History Family History  Problem Relation Age of Onset   Other Father        POSSIBLE HEART ATTACK   Arthritis Sister        "crippling"   Prostate cancer Brother    Arthritis Sister    Breast cancer Neg Hx    Colon cancer Neg Hx     Social History Social History   Tobacco Use   Smoking status: Former Smoker    Years: 30.00    Types: Pipe    Quit date: 09/20/2006    Years since quitting: 13.5   Smokeless tobacco: Never Used  Scientific laboratory technician Use: Never used  Substance Use Topics   Alcohol use: No    Alcohol/week: 0.0 standard drinks   Drug use: No     Allergies   Coumadin [warfarin sodium]   Review of Systems Review of Systems   Physical Exam Updated Vital Signs BP 120/65 (BP Location: Right Wrist)    Pulse 67    Temp 98.8 F (37.1 C) (Oral)    Resp 18    Ht  6\' 2"  (1.88 m)    Wt (!) 170.1 kg    SpO2 100%    BMI 48.15 kg/m   Physical Exam Constitutional:      General: He is not in acute  distress.    Appearance: Normal appearance. He is obese. He is not ill-appearing.  Neurological:     Mental Status: He is alert.     Oriented  x3 Unable to perform serial 7s  Judgement intact   ED Treatments / Results  Labs (all labs ordered are listed, but only abnormal results are displayed) Labs Reviewed - No data to display  EKG  Radiology No results found.  Procedures Procedures (including critical care time)  Medications Ordered in ED Medications - No data to display   Initial Impression / Assessment and Plan / ED Course  I have reviewed the triage vital signs and the nursing notes.  Pertinent labs & imaging results that were available during my care of the patient were reviewed by me and considered in my medical decision making (see chart for details).    Final Clinical Impressions(s) / ED Diagnoses   Final diagnoses:  None    New Prescriptions New Prescriptions   No medications on file

## 2020-04-15 NOTE — ED Notes (Signed)
Pt to CT

## 2020-04-15 NOTE — Discharge Instructions (Addendum)
You were advised to stay for admission to the hospital. You have chosen to go home and follow up with your pulmonologist in the morning. Call 911/return to the ER for any worsening or concerning symptoms.

## 2020-04-16 ENCOUNTER — Inpatient Hospital Stay (HOSPITAL_COMMUNITY)
Admission: EM | Admit: 2020-04-16 | Discharge: 2020-04-20 | DRG: 291 | Disposition: A | Discharge status: Home Health | Payer: Medicare HMO | Attending: Internal Medicine | Admitting: Internal Medicine

## 2020-04-16 ENCOUNTER — Other Ambulatory Visit (HOSPITAL_COMMUNITY): Payer: Medicare HMO

## 2020-04-16 ENCOUNTER — Telehealth: Payer: Self-pay | Admitting: Internal Medicine

## 2020-04-16 ENCOUNTER — Emergency Department (HOSPITAL_COMMUNITY): Payer: Medicare HMO

## 2020-04-16 DIAGNOSIS — Z6841 Body Mass Index (BMI) 40.0 and over, adult: Secondary | ICD-10-CM | POA: Diagnosis not present

## 2020-04-16 DIAGNOSIS — Z86718 Personal history of other venous thrombosis and embolism: Secondary | ICD-10-CM

## 2020-04-16 DIAGNOSIS — I130 Hypertensive heart and chronic kidney disease with heart failure and stage 1 through stage 4 chronic kidney disease, or unspecified chronic kidney disease: Secondary | ICD-10-CM | POA: Diagnosis not present

## 2020-04-16 DIAGNOSIS — R0689 Other abnormalities of breathing: Secondary | ICD-10-CM | POA: Diagnosis not present

## 2020-04-16 DIAGNOSIS — J9621 Acute and chronic respiratory failure with hypoxia: Secondary | ICD-10-CM | POA: Diagnosis present

## 2020-04-16 DIAGNOSIS — J9690 Respiratory failure, unspecified, unspecified whether with hypoxia or hypercapnia: Secondary | ICD-10-CM

## 2020-04-16 DIAGNOSIS — G4733 Obstructive sleep apnea (adult) (pediatric): Secondary | ICD-10-CM | POA: Diagnosis not present

## 2020-04-16 DIAGNOSIS — G8929 Other chronic pain: Secondary | ICD-10-CM | POA: Diagnosis present

## 2020-04-16 DIAGNOSIS — N1831 Chronic kidney disease, stage 3a: Secondary | ICD-10-CM | POA: Diagnosis present

## 2020-04-16 DIAGNOSIS — J9611 Chronic respiratory failure with hypoxia: Secondary | ICD-10-CM | POA: Diagnosis not present

## 2020-04-16 DIAGNOSIS — R404 Transient alteration of awareness: Secondary | ICD-10-CM | POA: Diagnosis not present

## 2020-04-16 DIAGNOSIS — R4701 Aphasia: Secondary | ICD-10-CM | POA: Diagnosis present

## 2020-04-16 DIAGNOSIS — I4819 Other persistent atrial fibrillation: Secondary | ICD-10-CM | POA: Diagnosis not present

## 2020-04-16 DIAGNOSIS — Z7901 Long term (current) use of anticoagulants: Secondary | ICD-10-CM

## 2020-04-16 DIAGNOSIS — G9341 Metabolic encephalopathy: Secondary | ICD-10-CM | POA: Diagnosis present

## 2020-04-16 DIAGNOSIS — Z20822 Contact with and (suspected) exposure to covid-19: Secondary | ICD-10-CM | POA: Diagnosis present

## 2020-04-16 DIAGNOSIS — J449 Chronic obstructive pulmonary disease, unspecified: Secondary | ICD-10-CM | POA: Diagnosis present

## 2020-04-16 DIAGNOSIS — Z79899 Other long term (current) drug therapy: Secondary | ICD-10-CM

## 2020-04-16 DIAGNOSIS — J9600 Acute respiratory failure, unspecified whether with hypoxia or hypercapnia: Secondary | ICD-10-CM | POA: Diagnosis not present

## 2020-04-16 DIAGNOSIS — R0902 Hypoxemia: Secondary | ICD-10-CM | POA: Diagnosis not present

## 2020-04-16 DIAGNOSIS — M109 Gout, unspecified: Secondary | ICD-10-CM | POA: Diagnosis present

## 2020-04-16 DIAGNOSIS — R739 Hyperglycemia, unspecified: Secondary | ICD-10-CM | POA: Diagnosis not present

## 2020-04-16 DIAGNOSIS — Z7951 Long term (current) use of inhaled steroids: Secondary | ICD-10-CM | POA: Diagnosis not present

## 2020-04-16 DIAGNOSIS — Z950 Presence of cardiac pacemaker: Secondary | ICD-10-CM | POA: Diagnosis not present

## 2020-04-16 DIAGNOSIS — I5033 Acute on chronic diastolic (congestive) heart failure: Secondary | ICD-10-CM | POA: Diagnosis present

## 2020-04-16 DIAGNOSIS — I872 Venous insufficiency (chronic) (peripheral): Secondary | ICD-10-CM | POA: Diagnosis not present

## 2020-04-16 DIAGNOSIS — E662 Morbid (severe) obesity with alveolar hypoventilation: Secondary | ICD-10-CM | POA: Diagnosis not present

## 2020-04-16 DIAGNOSIS — G629 Polyneuropathy, unspecified: Secondary | ICD-10-CM | POA: Diagnosis present

## 2020-04-16 DIAGNOSIS — J441 Chronic obstructive pulmonary disease with (acute) exacerbation: Secondary | ICD-10-CM | POA: Diagnosis not present

## 2020-04-16 DIAGNOSIS — J9601 Acute respiratory failure with hypoxia: Secondary | ICD-10-CM

## 2020-04-16 DIAGNOSIS — J90 Pleural effusion, not elsewhere classified: Secondary | ICD-10-CM | POA: Diagnosis not present

## 2020-04-16 DIAGNOSIS — J9602 Acute respiratory failure with hypercapnia: Secondary | ICD-10-CM

## 2020-04-16 DIAGNOSIS — E785 Hyperlipidemia, unspecified: Secondary | ICD-10-CM | POA: Diagnosis present

## 2020-04-16 DIAGNOSIS — J9811 Atelectasis: Secondary | ICD-10-CM | POA: Diagnosis not present

## 2020-04-16 DIAGNOSIS — Z952 Presence of prosthetic heart valve: Secondary | ICD-10-CM | POA: Diagnosis not present

## 2020-04-16 DIAGNOSIS — R062 Wheezing: Secondary | ICD-10-CM | POA: Diagnosis not present

## 2020-04-16 DIAGNOSIS — I10 Essential (primary) hypertension: Secondary | ICD-10-CM | POA: Diagnosis not present

## 2020-04-16 DIAGNOSIS — T380X5A Adverse effect of glucocorticoids and synthetic analogues, initial encounter: Secondary | ICD-10-CM | POA: Diagnosis not present

## 2020-04-16 DIAGNOSIS — R0602 Shortness of breath: Secondary | ICD-10-CM | POA: Diagnosis not present

## 2020-04-16 DIAGNOSIS — I4510 Unspecified right bundle-branch block: Secondary | ICD-10-CM | POA: Diagnosis not present

## 2020-04-16 DIAGNOSIS — I517 Cardiomegaly: Secondary | ICD-10-CM | POA: Diagnosis not present

## 2020-04-16 DIAGNOSIS — Z87891 Personal history of nicotine dependence: Secondary | ICD-10-CM

## 2020-04-16 DIAGNOSIS — C61 Malignant neoplasm of prostate: Secondary | ICD-10-CM | POA: Diagnosis present

## 2020-04-16 DIAGNOSIS — R5381 Other malaise: Secondary | ICD-10-CM | POA: Diagnosis present

## 2020-04-16 DIAGNOSIS — R531 Weakness: Secondary | ICD-10-CM | POA: Diagnosis not present

## 2020-04-16 DIAGNOSIS — J9622 Acute and chronic respiratory failure with hypercapnia: Secondary | ICD-10-CM

## 2020-04-16 DIAGNOSIS — J9610 Chronic respiratory failure, unspecified whether with hypoxia or hypercapnia: Secondary | ICD-10-CM | POA: Diagnosis present

## 2020-04-16 LAB — I-STAT ARTERIAL BLOOD GAS, ED
Acid-Base Excess: 11 mmol/L — ABNORMAL HIGH (ref 0.0–2.0)
Acid-Base Excess: 10 mmol/L — ABNORMAL HIGH (ref 0.0–2.0)
Acid-Base Excess: 12 mmol/L — ABNORMAL HIGH (ref 0.0–2.0)
Bicarbonate: 41.5 mmol/L — ABNORMAL HIGH (ref 20.0–28.0)
Bicarbonate: 40.7 mmol/L — ABNORMAL HIGH (ref 20.0–28.0)
Bicarbonate: 41.2 mmol/L — ABNORMAL HIGH (ref 20.0–28.0)
Calcium, Ion: 1.31 mmol/L (ref 1.15–1.40)
Calcium, Ion: 1.27 mmol/L (ref 1.15–1.40)
Calcium, Ion: 1.25 mmol/L (ref 1.15–1.40)
HCT: 36 % — ABNORMAL LOW (ref 39.0–52.0)
HCT: 38 % — ABNORMAL LOW (ref 39.0–52.0)
HCT: 37 % — ABNORMAL LOW (ref 39.0–52.0)
Hemoglobin: 12.2 g/dL — ABNORMAL LOW (ref 13.0–17.0)
Hemoglobin: 12.9 g/dL — ABNORMAL LOW (ref 13.0–17.0)
Hemoglobin: 12.6 g/dL — ABNORMAL LOW (ref 13.0–17.0)
O2 Saturation: 94 %
O2 Saturation: 92 %
O2 Saturation: 97 %
Patient temperature: 98.2
Patient temperature: 98.6
Potassium: 4.4 mmol/L (ref 3.5–5.1)
Potassium: 4.4 mmol/L (ref 3.5–5.1)
Potassium: 4.4 mmol/L (ref 3.5–5.1)
Sodium: 139 mmol/L (ref 135–145)
Sodium: 138 mmol/L (ref 135–145)
Sodium: 139 mmol/L (ref 135–145)
TCO2: 44 mmol/L — ABNORMAL HIGH (ref 22–32)
TCO2: 43 mmol/L — ABNORMAL HIGH (ref 22–32)
TCO2: 44 mmol/L — ABNORMAL HIGH (ref 22–32)
pCO2 arterial: 89.3 mmHg (ref 32.0–48.0)
pCO2 arterial: 89.7 mmHg (ref 32.0–48.0)
pCO2 arterial: 80 mmHg (ref 32.0–48.0)
pH, Arterial: 7.274 — ABNORMAL LOW (ref 7.350–7.450)
pH, Arterial: 7.265 — ABNORMAL LOW (ref 7.350–7.450)
pH, Arterial: 7.32 — ABNORMAL LOW (ref 7.350–7.450)
pO2, Arterial: 87 mmHg (ref 83.0–108.0)
pO2, Arterial: 77 mmHg — ABNORMAL LOW (ref 83.0–108.0)
pO2, Arterial: 104 mmHg (ref 83.0–108.0)

## 2020-04-16 LAB — BLOOD GAS, ARTERIAL
Acid-Base Excess: 8.9 mmol/L — ABNORMAL HIGH (ref 0.0–2.0)
Bicarbonate: 37.5 mmol/L — ABNORMAL HIGH (ref 20.0–28.0)
Drawn by: 257081
FIO2: 100
O2 Saturation: 97.3 %
Patient temperature: 37
pCO2 arterial: 110 mmHg (ref 32.0–48.0)
pH, Arterial: 7.158 — CL (ref 7.350–7.450)
pO2, Arterial: 111 mmHg — ABNORMAL HIGH (ref 83.0–108.0)

## 2020-04-16 LAB — CBC WITH DIFFERENTIAL/PLATELET
Abs Immature Granulocytes: 0.06 10*3/uL (ref 0.00–0.07)
Basophils Absolute: 0 10*3/uL (ref 0.0–0.1)
Basophils Relative: 0 %
Eosinophils Absolute: 0 10*3/uL (ref 0.0–0.5)
Eosinophils Relative: 0 %
HCT: 42.5 % (ref 39.0–52.0)
Hemoglobin: 12.4 g/dL — ABNORMAL LOW (ref 13.0–17.0)
Immature Granulocytes: 1 %
Lymphocytes Relative: 10 %
Lymphs Abs: 0.7 10*3/uL (ref 0.7–4.0)
MCH: 31.5 pg (ref 26.0–34.0)
MCHC: 29.2 g/dL — ABNORMAL LOW (ref 30.0–36.0)
MCV: 107.9 fL — ABNORMAL HIGH (ref 80.0–100.0)
Monocytes Absolute: 0.5 10*3/uL (ref 0.1–1.0)
Monocytes Relative: 7 %
Neutro Abs: 5.7 10*3/uL (ref 1.7–7.7)
Neutrophils Relative %: 82 %
Platelets: 139 10*3/uL — ABNORMAL LOW (ref 150–400)
RBC: 3.94 MIL/uL — ABNORMAL LOW (ref 4.22–5.81)
RDW: 16.6 % — ABNORMAL HIGH (ref 11.5–15.5)
WBC: 7 10*3/uL (ref 4.0–10.5)
nRBC: 0.7 % — ABNORMAL HIGH (ref 0.0–0.2)

## 2020-04-16 LAB — CBG MONITORING, ED
Glucose-Capillary: 115 mg/dL — ABNORMAL HIGH (ref 70–99)
Glucose-Capillary: 89 mg/dL (ref 70–99)

## 2020-04-16 LAB — BASIC METABOLIC PANEL
Anion gap: 11 (ref 5–15)
BUN: 39 mg/dL — ABNORMAL HIGH (ref 8–23)
CO2: 34 mmol/L — ABNORMAL HIGH (ref 22–32)
Calcium: 9.4 mg/dL (ref 8.9–10.3)
Chloride: 95 mmol/L — ABNORMAL LOW (ref 98–111)
Creatinine, Ser: 1.58 mg/dL — ABNORMAL HIGH (ref 0.61–1.24)
GFR calc Af Amer: 48 mL/min — ABNORMAL LOW (ref >60)
GFR calc non Af Amer: 42 mL/min — ABNORMAL LOW (ref >60)
Glucose, Bld: 124 mg/dL — ABNORMAL HIGH (ref 70–99)
Potassium: 4.7 mmol/L (ref 3.5–5.1)
Sodium: 140 mmol/L (ref 135–145)

## 2020-04-16 LAB — GLUCOSE, CAPILLARY
Glucose-Capillary: 120 mg/dL — ABNORMAL HIGH (ref 70–99)
Glucose-Capillary: 95 mg/dL (ref 70–99)

## 2020-04-16 LAB — TROPONIN I (HIGH SENSITIVITY)
Troponin I (High Sensitivity): 56 ng/L — ABNORMAL HIGH (ref <18)
Troponin I (High Sensitivity): 52 ng/L — ABNORMAL HIGH (ref <18)

## 2020-04-16 LAB — BRAIN NATRIURETIC PEPTIDE: B Natriuretic Peptide: 557.9 pg/mL — ABNORMAL HIGH (ref 0.0–100.0)

## 2020-04-16 LAB — MAGNESIUM: Magnesium: 2.3 mg/dL (ref 1.7–2.4)

## 2020-04-16 LAB — SARS CORONAVIRUS 2 BY RT PCR (HOSPITAL ORDER, PERFORMED IN ~~LOC~~ HOSPITAL LAB): SARS Coronavirus 2: NEGATIVE

## 2020-04-16 MED ORDER — METHYLPREDNISOLONE SODIUM SUCC 40 MG IJ SOLR
40.0000 mg | Freq: Two times a day (BID) | INTRAMUSCULAR | Status: DC
  Administered 2020-04-16 – 2020-04-20 (×8): 40 mg via INTRAVENOUS
  Filled 2020-04-16 (×9): qty 1

## 2020-04-16 MED ORDER — ORAL CARE MOUTH RINSE
15.0000 mL | Freq: Two times a day (BID) | OROMUCOSAL | Status: DC
  Administered 2020-04-17 – 2020-04-19 (×5): 15 mL via OROMUCOSAL

## 2020-04-16 MED ORDER — FUROSEMIDE 10 MG/ML IJ SOLN
40.0000 mg | Freq: Four times a day (QID) | INTRAMUSCULAR | Status: AC
  Administered 2020-04-16 (×2): 40 mg via INTRAVENOUS
  Filled 2020-04-16 (×2): qty 4

## 2020-04-16 MED ORDER — CHLORHEXIDINE GLUCONATE 0.12 % MT SOLN
15.0000 mL | Freq: Two times a day (BID) | OROMUCOSAL | Status: DC
  Administered 2020-04-16 – 2020-04-20 (×8): 15 mL via OROMUCOSAL
  Filled 2020-04-16 (×8): qty 15

## 2020-04-16 MED ORDER — IPRATROPIUM-ALBUTEROL 0.5-2.5 (3) MG/3ML IN SOLN
3.0000 mL | Freq: Four times a day (QID) | RESPIRATORY_TRACT | Status: DC
  Administered 2020-04-16 – 2020-04-17 (×3): 3 mL via RESPIRATORY_TRACT
  Filled 2020-04-16 (×3): qty 3

## 2020-04-16 MED ORDER — ALBUTEROL SULFATE (2.5 MG/3ML) 0.083% IN NEBU: 2.5000 mg | INHALATION_SOLUTION | RESPIRATORY_TRACT | Status: DC | PRN

## 2020-04-16 MED ORDER — PANTOPRAZOLE SODIUM 40 MG IV SOLR
40.0000 mg | INTRAVENOUS | Status: DC
  Administered 2020-04-16 – 2020-04-19 (×4): 40 mg via INTRAVENOUS
  Filled 2020-04-16 (×4): qty 40

## 2020-04-16 MED ORDER — SODIUM CHLORIDE 0.9 % IV SOLN: INTRAVENOUS | Status: DC | PRN

## 2020-04-16 MED ORDER — INSULIN ASPART 100 UNIT/ML ~~LOC~~ SOLN
0.0000 [IU] | SUBCUTANEOUS | Status: DC
  Administered 2020-04-17: 7 [IU] via SUBCUTANEOUS
  Administered 2020-04-17: 4 [IU] via SUBCUTANEOUS
  Administered 2020-04-18: 3 [IU] via SUBCUTANEOUS
  Administered 2020-04-19 (×4): 4 [IU] via SUBCUTANEOUS
  Administered 2020-04-20: 3 [IU] via SUBCUTANEOUS
  Administered 2020-04-20: 4 [IU] via SUBCUTANEOUS

## 2020-04-16 MED ORDER — CHLORHEXIDINE GLUCONATE CLOTH 2 % EX PADS
6.0000 | MEDICATED_PAD | Freq: Every day | CUTANEOUS | Status: DC
  Administered 2020-04-16 – 2020-04-20 (×5): 6 via TOPICAL

## 2020-04-16 NOTE — Telephone Encounter (Signed)
We will need to get him in to see what needs to be done to qualify for portable oxygen concentrator- next available RNA slot. Don't turn night-time oxygen higher than 3L.

## 2020-04-16 NOTE — Progress Notes (Signed)
Follow up ABG obtained after respiratory rate increase to 18.  Tolerating well at this time.  Will continue to monitor.    Ref. Range 04/16/2020 18:16  Sample type Unknown ARTERIAL  pH, Arterial Latest Ref Range: 7.35 - 7.45  7.320 (L)  pCO2 arterial Latest Ref Range: 32 - 48 mmHg 80.0 (HH)  pO2, Arterial Latest Ref Range: 83 - 108 mmHg 104  TCO2 Latest Ref Range: 22 - 32 mmol/L 44 (H)  Acid-Base Excess Latest Ref Range: 0.0 - 2.0 mmol/L 12.0 (H)  Bicarbonate Latest Ref Range: 20.0 - 28.0 mmol/L 41.2 (H)  O2 Saturation Latest Units: % 97.0  Collection site Unknown Radial

## 2020-04-16 NOTE — ED Provider Notes (Signed)
[name]   CSN: [id number] Arrival date & time: 04/16/20  1403     History Chief Complaint  Patient presents with  . Respiratory Distress  . Fatigue    [name] is a 79 y.o. male.  Presents to ER with concern for shortness of breath.  History limited due to acuity, altered mental status.  Additional history obtained via discussion with patient son, wife.  Performed chart review.  Seen in ER at [facility] with concern for shortness of breath, intermittent confusion.  Patient found to have significant CO2 retention on ABG and was recommended to be admitted to the hospital.  He was adamant about discharge home and follow-up with his primary doctors.  Signed out AMA.  This morning after waking up some reports that he was alert and appropriate however after taking a nap later in the morning he became increasing slightly somnolent, not breathing well.  When patient question, he denies any acute pain.  Unable to respond except brief phrases.  HPI     Past Medical History:  Diagnosis Date  . 1st degree AV block 11/09/2018   Noted on EKG   . Anemia   . Aortic stenosis, severe    S/p Edwards Sapien 3 Transcatheter Heart Valve (size 26 mm, model # [id number], serial # [id number])  . Arthritis   . Back pain   . Bursitis   . Cataract    left immature  . Cellulitis 10/12/2015  . CHF (congestive heart failure) ([place])   . Complication of anesthesia    Halucinations  . Constipation    takes Miralax daily as well as Senokot daily  . DDD (degenerative disc disease)   . Gout    takes Allopurinol daily  . Heart valve disorder   . History of blood clots 1962   knee  . History of blood transfusion    no abnormal reaction noted  . History of shingles   . Hyperlipidemia    takes Atorvastatin daily  . Hypertension    takes Lisinopril daily  . Joint pain   . Low back pain 01/25/2017  . Morbid obesity ([place])   . Neck pain on left side  01/25/2017  . OSA (obstructive sleep apnea)   . Osteoarthritis   . Peripheral edema    takes Furosemide daily  . Peripheral neuropathy    takes Gabapentin daily  . Peroneal palsy    significant right foot drop  . Persistent atrial fibrillation ([place])   . Pneumonia 25+yrs ago   hx of  . Prostate cancer ([place])   . Prostate cancer ([employer]) 02/13/2019  . RBBB 11/09/2018   Noted on EKG  . Shortness of breath   . Swelling of extremity   . Thrombocytopenia ([place])   . Urinary frequency   . Urinary urgency   . Valvular heart disease   . Venous stasis dermatitis     Patient Active Problem List   Diagnosis Date Noted  . Chronic respiratory failure with hypoxia ([place]) 02/26/2020  . Coagulation defect ([place]) 12/07/2019  . Malignant neoplasm of prostate ([place]) 05/15/2019  . Pain due to onychomycosis of toenails of both feet 03/13/2019  . PVD (peripheral vascular disease) ([place]) 03/13/2019  . Prostate cancer ([place]) 02/13/2019  . Urinary tract infection 12/16/2018  . Hypoxia 10/27/2018  . Persistent atrial fibrillation ([place]) 10/17/2018  . Primary osteoarthritis of left shoulder 08/23/2018  . Renal insufficiency 06/29/2018  . Bradycardia 04/26/2018  . Other fatigue  12/06/2017  . Shortness of breath on exertion 12/06/2017  . Essential hypertension 12/06/2017  . Hyperglycemia 07/28/2017  . Neck pain on left side 01/25/2017  . Low back pain 01/25/2017  . Constipation 08/15/2015  . Severe aortic valve stenosis   . Medicare annual wellness visit, subsequent 03/09/2015  . Preventative health care 03/09/2015  . Allergic rhinitis 12/02/2014  . COPD mixed type ([place]) 12/02/2014  . Chronic venous insufficiency 10/02/2013  . Venous stasis dermatitis 10/02/2013  . AVM (arteriovenous malformation) of colon with hemorrhage 02/10/2013  . Thrombocytopenia, unspecified ([place]) 02/09/2013  . EDEMA 04/15/2010  . HYPERCHOLESTEROLEMIA 04/14/2010  . Gout 04/14/2010  . Anemia 04/14/2010  . Aortic valve disorder  04/14/2010  . VALVULAR HEART DISEASE 04/14/2010  . Osteoarthritis 04/14/2010  . Multilevel degenerative disc disease 04/14/2010  . BURSITIS 04/14/2010  . OBESITY, MORBID 03/21/2010  . Obstructive sleep apnea 02/19/2008    Past Surgical History:  Procedure Laterality Date  . CARDIAC CATHETERIZATION N/A 05/02/2015   Procedure: Right/Left Heart Cath and Coronary Angiography;  Surgeon: Burnell Blanks, MD;  Location: [place] [facility];  Service: Cardiovascular;  Laterality: N/A;  . CARDIOVERSION N/A 10/27/2018   Procedure: CARDIOVERSION;  Surgeon: Fay Records, MD;  Location: [facility] ENDOSCOPY;  Service: Cardiovascular;  Laterality: N/A;  . COLONOSCOPY N/A 02/07/2013   Procedure: COLONOSCOPY;  Surgeon: Juanita Craver, MD;  Location: [facility] ENDOSCOPY;  Service: Endoscopy;  Laterality: N/A;  . COLONOSCOPY N/A 02/09/2013   Procedure: COLONOSCOPY;  Surgeon: Beryle Beams, MD;  Location: [place];  Service: Endoscopy;  Laterality: N/A;  . ESOPHAGOGASTRODUODENOSCOPY N/A 02/07/2013   Procedure: ESOPHAGOGASTRODUODENOSCOPY (EGD);  Surgeon: Juanita Craver, MD;  Location: [facility] ENDOSCOPY;  Service: Endoscopy;  Laterality: N/A;  . GIVENS CAPSULE STUDY N/A 02/09/2013   Procedure: GIVENS CAPSULE STUDY;  Surgeon: Beryle Beams, MD;  Location: [place];  Service: Endoscopy;  Laterality: N/A;  . HERNIA REPAIR     umbilical hernia  . KNEE SURGERY Right    multiple knee surgeries due to complication of R TKA  . LAMINOTOMY  5621   c6-t2  . PACEMAKER IMPLANT N/A 04/27/2018   Procedure: PACEMAKER IMPLANT;  Surgeon: Constance Haw, MD;  Location: [place] [facility];  Service: Cardiovascular;  Laterality: N/A;  . PROSTATE BIOPSY N/A 04/12/2019   Procedure: BIOPSY TRANSRECTAL ULTRASONIC PROSTATE (TUBP) CYSTOSCOPY;  Surgeon: [name] Hughs, MD;  Location: [facility];  Service: Urology;  Laterality: N/A;  . SPINE SURGERY    . TEE WITHOUT CARDIOVERSION N/A 03/07/2015   Procedure: TRANSESOPHAGEAL ECHOCARDIOGRAM  (TEE);  Surgeon: Josue Hector, MD;  Location: [facility] ENDOSCOPY;  Service: Cardiovascular;  Laterality: N/A;  ANES TO BRING PROPOFOL PER DOCTOR  . TEE WITHOUT CARDIOVERSION N/A 06/10/2015   Procedure: TRANSESOPHAGEAL ECHOCARDIOGRAM (TEE);  Surgeon: Burnell Blanks, MD;  Location: [place];  Service: Open Heart Surgery;  Laterality: N/A;  . TOTAL KNEE ARTHROPLASTY     right   x4 left x 1   . TRANSCATHETER AORTIC VALVE REPLACEMENT, TRANSFEMORAL Left 06/10/2015   Procedure: TRANSCATHETER AORTIC VALVE REPLACEMENT, TRANSFEMORAL;  Surgeon: Burnell Blanks, MD;  Location: [place];  Service: Open Heart Surgery;  Laterality: Left;       Family History  Problem Relation Age of Onset  . Other Father        POSSIBLE HEART ATTACK  . Arthritis Sister        "crippling"  . Prostate cancer Brother   . Arthritis Sister   . Breast cancer Neg  Hx   . Colon cancer Neg Hx     Social History   Tobacco Use  . Smoking status: Former Smoker    Years: 30.00    Types: Pipe    Quit date: 09/20/2006    Years since quitting: 13.5  . Smokeless tobacco: Never Used  Vaping Use  . Vaping Use: Never used  Substance Use Topics  . Alcohol use: No    Alcohol/week: 0.0 standard drinks  . Drug use: No    Home Medications Prior to Admission medications   Medication Sig Start Date End Date Taking? Authorizing Provider  acetaminophen (TYLENOL) 500 MG tablet Take 1,000 mg by mouth 2 (two) times daily.     [provider]  allopurinol (ZYLOPRIM) 100 MG tablet TAKE 1 TABLET BY MOUTH EVERY DAY 02/12/20   [name], MD  atorvastatin (LIPITOR) 40 MG tablet Take 1 and a half tablet daily by mouth  (60mg ) 05/18/19   [name], MD  Cholecalciferol (VITAMIN D3) 125 MCG (5000 UT) CAPS Take 5,000 Units by mouth daily.    [provider]  Coenzyme Q10 (COQ10) 100 MG CAPS Take 300 mg by mouth daily.     [provider]  CRANBERRY PO Take 1 capsule by mouth 2 (two) times a day.     [provider]  ELIQUIS 5 MG TABS tablet TAKE 1 TABLET BY MOUTH TWICE A DAY 12/17/19   Sherran Needs, NP  furosemide (LASIX) 20 MG tablet TAKE 1 TABLET BY MOUTH TWICE A DAY 02/27/20   Josue Hector, MD  gabapentin (NEURONTIN) 300 MG capsule 1 cap po bid and 2 caps po qhs 03/06/20   [name], MD  HEMATINIC/FOLIC ACID 885-0 MG TABS TAKE 1 TABLET BY MOUTH EVERY DAY Patient taking differently: 2 (two) times a week.  01/02/20   [name], MD  levocetirizine (XYZAL) 5 MG tablet Take 1 tablet (5 mg total) by mouth every evening. 03/25/20   [name], MD  Misc Natural Products (TART CHERRY ADVANCED PO) Take 1,200 mg by mouth 2 (two) times a day.     [provider]  montelukast (SINGULAIR) 10 MG tablet Take 1 tablet (10 mg total) by mouth at bedtime. 03/25/20   [name], MD  Multiple Vitamins-Minerals (CENTRUM SILVER PO) Take 1 tablet by mouth daily.    [provider]  Phenazopyridine HCl (AZO-STANDARD PO) Take by mouth.    [provider]  polyethylene glycol (MIRALAX / GLYCOLAX) packet Take 17 g by mouth daily.    [provider]  sennosides-docusate sodium (SENOKOT-S) 8.6-50 MG tablet Take 1 tablet by mouth daily.     [provider]  vitamin C (ASCORBIC ACID) 500 MG tablet Take 500 mg by mouth 2 (two) times daily.     [provider]    Allergies    Coumadin [warfarin sodium]  Review of Systems   Review of Systems  Unable to perform ROS: Acuity of condition    Physical Exam Updated Vital Signs BP (!) 140/67   Pulse 66   Temp (!) 96 F (35.6 C) (Temporal)   Resp (!) 28   SpO2 96%   Physical Exam Vitals and nursing note reviewed.  Constitutional:      Comments: Morbidly obese, somewhat confused, poor respiratory effort  HENT:     Head: Normocephalic and atraumatic.     Nose: Nose normal.     Mouth/Throat:     Mouth:  Mucous membranes are moist.  Eyes:     Extraocular Movements: Extraocular  movements intact.     Pupils: Pupils are equal, round, and reactive to light.  Cardiovascular:     Rate and Rhythm: Normal rate.     Pulses: Normal pulses.     Heart sounds: Normal heart sounds.  Pulmonary:     Comments: Poor respiratory effort, shallow breathing, somewhat slow, breath sounds clear without wheezing or crackles Abdominal:     General: Abdomen is flat. There is no distension.  Musculoskeletal:     Cervical back: No rigidity or tenderness.     Comments: Mild swelling of bilateral lower extremities  Skin:    General: Skin is warm and dry.  Neurological:     Mental Status: He is alert.     GCS: GCS eye subscore is 4. GCS verbal subscore is 3. GCS motor subscore is 5.     Comments: Opens eyes spontaneously, intermittently follows commands, confused verbal response to disorder verbal response  Psychiatric:        Mood and Affect: Mood normal.        Behavior: Behavior normal.     ED Results / Procedures / Treatments   Labs (all labs ordered are listed, but only abnormal results are displayed) Labs Reviewed  BLOOD GAS, ARTERIAL - Abnormal; Notable for the following components:      Result Value   pH, Arterial 7.158 (*)    pCO2 arterial 110 (*)    pO2, Arterial 111 (*)    Bicarbonate 37.5 (*)    Acid-Base Excess 8.9 (*)    All other components within normal limits  CBC WITH DIFFERENTIAL/PLATELET - Abnormal; Notable for the following components:   RBC 3.94 (*)    Hemoglobin 12.4 (*)    MCV 107.9 (*)    MCHC 29.2 (*)    RDW 16.6 (*)    Platelets 139 (*)    nRBC 0.7 (*)    All other components within normal limits  BRAIN NATRIURETIC PEPTIDE - Abnormal; Notable for the following components:   B Natriuretic Peptide 557.9 (*)    All other components within normal limits  BASIC METABOLIC PANEL - Abnormal; Notable for the following components:   Chloride 95 (*)    CO2 34 (*)    Glucose, Bld 124 (*)    BUN 39 (*)    Creatinine, Ser 1.58 (*)    GFR calc non Af  Amer 42 (*)    GFR calc Af Amer 48 (*)    All other components within normal limits  CBG MONITORING, ED - Abnormal; Notable for the following components:   Glucose-Capillary 115 (*)    All other components within normal limits  I-STAT ARTERIAL BLOOD GAS, ED - Abnormal; Notable for the following components:   pH, Arterial 7.265 (*)    pCO2 arterial 89.7 (*)    pO2, Arterial 77 (*)    Bicarbonate 40.7 (*)    TCO2 43 (*)    Acid-Base Excess 10.0 (*)    HCT 38.0 (*)    Hemoglobin 12.9 (*)    All other components within normal limits  TROPONIN I (HIGH SENSITIVITY) - Abnormal; Notable for the following components:   Troponin I (High Sensitivity) 56 (*)    All other components within normal limits  SARS CORONAVIRUS 2 BY RT PCR (HOSPITAL ORDER, [place] [facility])  MAGNESIUM  BLOOD GAS, ARTERIAL    EKG EKG Interpretation  Date/Time:  Wednesday  April 16 2020 14:10:16 EDT Ventricular Rate:  65 PR Interval:    QRS Duration: 143 QT Interval:  448 QTC Calculation: 466 R Axis:   128 Text Interpretation: Sinus rhythm Prolonged PR interval Right bundle branch block Confirmed by Madalyn Rob [phone]) on 04/16/2020 3:11:06 PM   Radiology CT Head Wo Contrast  Result Date: 04/15/2020 CLINICAL DATA:  Expressive aphasia EXAM: CT HEAD WITHOUT CONTRAST TECHNIQUE: Contiguous axial images were obtained from the base of the skull through the vertex without intravenous contrast. COMPARISON:  None. FINDINGS: Brain: No evidence of acute territorial infarction, hemorrhage, hydrocephalus,extra-axial collection or mass lesion/mass effect. There is a hyperdense mass seen overlying the superior falx with internal calcifications measuring 2.4 x 1.8 cm, likely meningioma. No surrounding mass effect is seen. Normal gray-white differentiation. Ventricles are normal in size and contour. Vascular: No hyperdense vessel or unexpected calcification. Skull: The skull is intact. No fracture or focal  lesion identified. Sinuses/Orbits: The visualized paranasal sinuses and mastoid air cells are clear. The orbits and globes intact. Other: None IMPRESSION: No acute intracranial abnormality. Probable partially calcified meningioma overlying the superior falx measuring 2.4 x 1.8 cm. Electronically Signed   By: Prudencio Pair M.D.   On: 04/15/2020 21:35   DG Chest Port 1 View  Result Date: 04/15/2020 CLINICAL DATA:  Expressive aphasia EXAM: PORTABLE CHEST 1 VIEW COMPARISON:  October 27, 2018 FINDINGS: There is unchanged moderate cardiomegaly. There is prominence of the central pulmonary vasculature. A left-sided pacemaker is seen. No large airspace consolidation. No acute osseous abnormality IMPRESSION: Cardiomegaly and pulmonary vascular congestion Electronically Signed   By: Prudencio Pair M.D.   On: 04/15/2020 21:01    Procedures .Critical Care Performed by: Lucrezia Starch, MD Authorized by: Lucrezia Starch, MD   Critical care provider statement:    Critical care time (minutes):  45   Critical care was necessary to treat or prevent imminent or life-threatening deterioration of the following conditions:  Cardiac failure and respiratory failure   Critical care was time spent personally by me on the following activities:  Discussions with consultants, evaluation of patient's response to treatment, examination of patient, ordering and performing treatments and interventions, ordering and review of laboratory studies, ordering and review of radiographic studies, pulse oximetry, re-evaluation of patient's condition, obtaining history from patient or surrogate and review of old charts   (including critical care time)  Medications Ordered in ED Medications - No data to display  ED Course  I have reviewed the triage vital signs and the nursing notes.  Pertinent labs & imaging results that were available during my care of the patient were reviewed by me and considered in my medical decision making  (see chart for details).  Clinical Course as of Apr 17 1543  Wed Apr 16, 2020  1510 Reassessed patient, marked improvement in mental status, sitting up, able to speak in short phrases, follows commands, will continue BiPAP   [RD]    Clinical Course User Index [RD] Lucrezia Starch, MD   MDM Rules/Calculators/A&P                         79 year old male extensive past medical history notable for hypertension, A. fib, aortic valve disease s/p TAVR, chronic respiratory failure, COPD, sleep apnea presents to ER with concern for increased confusion, respiratory distress.  Exam initially, patient somewhat confused, with open eyes and follow basic commands.  ABG concerning for significant respiratory acidosis.  Suspect multifactorial including  OSA, obesity hypoventilation syndrome.  Felt patient had enough mental status to try BiPAP.  Soon after starting BiPAP, patient had significant improvement in mental status.  Lab work was grossly stable, noted mild elevation in troponin but patient denied any chest pain and EKG without acute ischemic changes.  While awaiting reassessment, repeat ABG, patient signed out to Dr. Vallery Ridge.  Refer to her note for final plan and disposition.  Anticipate admission to the hospitalist service if patient continues to tolerate BiPAP and gas is improving.  Final Clinical Impression(s) / ED Diagnoses Final diagnoses:  Acute respiratory failure with hypoxia and hypercapnia ([place])    Rx / DC Orders ED Discharge Orders    None       Lucrezia Starch, MD 04/16/20 1544

## 2020-04-16 NOTE — ED Triage Notes (Signed)
Pt bib ems from home after family reports pt hard to arouse. Pt arrives on NRB at 15L. arousable to painful stimuli. 75% on home 4L [place].

## 2020-04-16 NOTE — Progress Notes (Signed)
Made MD aware that CO2 was unreadable in I-stat and sent to lab. BiPAP placed on patient at this time. 16/8  And 40%. Wife sitting at bedside. Will continue to monitor.

## 2020-04-16 NOTE — ED Notes (Signed)
Attempted report 

## 2020-04-16 NOTE — H&P (Signed)
NAME:  [name], MRN:  [id number], DOB:  Mar 12, 1941, LOS: 0 ADMISSION DATE:  04/16/2020, CONSULTATION DATE:  04/16/2020 REFERRING MD:  Dr. Roslynn Amble, ER, CHIEF COMPLAINT:  Respiratory failure  Brief History   79 yo male former smoker brought to ER with altered mental status.  Found to have acute on chronic hypoxic/hypercapnic respiratory failure.  Followed by Dr. Annamaria Boots in pulmonary office for COPD and OSA/OHS.  Has chronic back pain.  Family reports he took extra oxycodone couple days prior to admission.  History of present illness   Hx from chart and family.  Family reports he has chronic pain issues.  He had extra oxycodone at home.  He reportedly took doses on 7/23 and 7/24.  Family unsure if he took after this.  On 7/27 he was noted to be confused and non verbal.  Family brought him to ER.  He was found to have acute on chronic hypoxic/hypercapnic respiratory failure.  CT head was negative except for partially calcified meningioma.  He was advised to increase his supplemental oxygen to 4 liters.  He opted against admission at that time.    Wife reports he was back to normal status in AM of 7/28.  He went to take a nap in the afternoon of 7/28 and was wearing 4 liters oxygen.  After he fell asleep, family was not able to arouse him.  He was brought back to ER.  Found to have worsening hypercapnia.  Started on Bipap with some improvement in ABG and mental status.  Family denies any cough, sputum, wheeze, fever, chest pain.  He has chronic leg swelling.  He completed Moderna COVID vaccination in March 2021.  Past Medical History  Venous stasis dermatitis, Thrombocytopenia, RBBB, Prostate cancer, Persistent A fib on eliquis, Foot drop, Peripheral neuropathy, OA, OSA, Back pain, HTN, HLD, Shingles, Gout, Aortic stenosis s/p TAVR, Anemia, COPD  Significant Hospital Events   7/28 Admit  Consults:    Procedures:    Significant Diagnostic Tests:    Micro Data:  COVID 7/28 >>    Antimicrobials:     Interim history/subjective:    Objective   Blood pressure (!) 140/67, pulse 66, temperature (!) 96 F (35.6 C), temperature source Temporal, resp. rate (!) 28, SpO2 96 %.    Vent Mode: BIPAP;PCV FiO2 (%):  [40 %] 40 % Set Rate:  [15 bmp] 15 bmp PEEP:  [8 cmH20] 8 cmH20  No intake or output data in the 24 hours ending 04/16/20 1602 There were no vitals filed for this visit.  Examination:  General - somnolent Eyes - pupils reactive ENT - no sinus tenderness, no stridor Cardiac - irregular, 2/6 SM Chest - decreased BS, faint wheeze Abdomen - soft, non tender, + bowel sounds Extremities - 2+ edema Skin - venous stasis changes lower legs Neuro - opens eyes with stimulation  Resolved Hospital Problem list     Assessment & Plan:   Acute on chronic hypoxic/hypercapnic respiratory failure. - from COPD exacerbation, OSA/OHS, opiate medication use, CHF exacerbation - continue Bipap - goal SpO2 88 to 95% - scheduled BDs - solumedrol - lasix 40 mg IV q6h x 2 - f/u CXR, ABG - admit to ICU and monitor need for intubation  Acute metabolic encephalopathy from hypoxia, hypercapnia. Hx of chronic back pain, peripheral neuropathy. - avoid opiates - hold outpt neurontin  Acute on chronic diastolic CHF. Hx of permanent a fib present on admission. Hx of HTN, s/p TAVR, HLD. - lasix IV -  hold outpt lipitor, eliquis while NPO  Steroid induced hyperglycemia. - SSI  CKD 3a. - f/u BMET  Morbid obesity. - BMI 43.65 - weight loss  Chronic venous stasis changes of lower legs. - wound care  Best practice:  Diet: NPO DVT prophylaxis: SCDs GI prophylaxis: Protonix Mobility: bed rest Code Status: full code Family Communication: updated family at bedside Disposition: ICU  Labs   CBC: Recent Labs  Lab 04/15/20 2152 04/15/20 2236 04/16/20 1428 04/16/20 1533  WBC 7.0  --  7.0  --   NEUTROABS 5.5  --  5.7  --   HGB 11.6* 12.2* 12.4* 12.9*  HCT  38.1* 36.0* 42.5 38.0*  MCV 103.5*  --  107.9*  --   PLT 150  --  139*  --     Basic Metabolic Panel: Recent Labs  Lab 04/15/20 2152 04/15/20 2236 04/16/20 1428 04/16/20 1533  NA 139 139 140 138  K 4.5 4.4 4.7 4.4  CL 93*  --  95*  --   CO2 35*  --  34*  --   GLUCOSE 112*  --  124*  --   BUN 40*  --  39*  --   CREATININE 1.49*  --  1.58*  --   CALCIUM 8.9  --  9.4  --   MG  --   --  2.3  --    GFR: Estimated Creatinine Clearance: 65 mL/min (A) (by C-G formula based on SCr of 1.58 mg/dL (H)). Recent Labs  Lab 04/15/20 2152 04/16/20 1428  WBC 7.0 7.0    Liver Function Tests: Recent Labs  Lab 04/15/20 2152  AST 23  ALT 25  ALKPHOS 58  BILITOT 0.6  PROT 6.4*  ALBUMIN 3.3*   No results for input(s): LIPASE, AMYLASE in the last 168 hours. Recent Labs  Lab 04/15/20 2227  AMMONIA 20    ABG    Component Value Date/Time   PHART 7.265 (L) 04/16/2020 1533   PCO2ART 89.7 (HH) 04/16/2020 1533   PO2ART 77 (L) 04/16/2020 1533   HCO3 40.7 (H) 04/16/2020 1533   TCO2 43 (H) 04/16/2020 1533   O2SAT 92.0 04/16/2020 1533     Coagulation Profile: No results for input(s): INR, PROTIME in the last 168 hours.  Cardiac Enzymes: No results for input(s): CKTOTAL, CKMB, CKMBINDEX, TROPONINI in the last 168 hours.  HbA1C: Hgb A1c MFr Bld  Date/Time Value Ref Range Status  03/10/2020 02:01 PM 5.7 4.6 - 6.5 % Final    Comment:    Glycemic Control Guidelines for People with Diabetes:Non Diabetic:  <6%Goal of Therapy: <7%Additional Action Suggested:  >8%   11/20/2019 10:30 AM 5.6 4.6 - 6.5 % Final    Comment:    Glycemic Control Guidelines for People with Diabetes:Non Diabetic:  <6%Goal of Therapy: <7%Additional Action Suggested:  >8%     CBG: Recent Labs  Lab 04/15/20 2205 04/16/20 1411  GLUCAP 98 115*    Review of Systems:   Unable to obtain  Past Medical History  He,  has a past medical history of 1st degree AV block (11/09/2018), Anemia, Aortic stenosis,  severe, Arthritis, Back pain, Bursitis, Cataract, Cellulitis (10/12/2015), CHF (congestive heart failure) ([place]), Complication of anesthesia, Constipation, DDD (degenerative disc disease), Gout, Heart valve disorder, History of blood clots (1962), History of blood transfusion, History of shingles, Hyperlipidemia, Hypertension, Joint pain, Low back pain (01/25/2017), Morbid obesity ([place]), Neck pain on left side (01/25/2017), OSA (obstructive sleep apnea), Osteoarthritis, Peripheral edema, Peripheral neuropathy, Peroneal palsy,  Persistent atrial fibrillation ([place]), Pneumonia (25+yrs ago), Prostate cancer [facility]), Prostate cancer ([place]) (02/13/2019), RBBB (11/09/2018), Shortness of breath, Swelling of extremity, Thrombocytopenia ([place]), Urinary frequency, Urinary urgency, Valvular heart disease, and Venous stasis dermatitis.   Surgical History    Past Surgical History:  Procedure Laterality Date  . CARDIAC CATHETERIZATION N/A 05/02/2015   Procedure: Right/Left Heart Cath and Coronary Angiography;  Surgeon: Burnell Blanks, MD;  Location: [facility] CV LAB;  Service: Cardiovascular;  Laterality: N/A;  . CARDIOVERSION N/A 10/27/2018   Procedure: CARDIOVERSION;  Surgeon: Fay Records, MD;  Location: [facility] ENDOSCOPY;  Service: Cardiovascular;  Laterality: N/A;  . COLONOSCOPY N/A 02/07/2013   Procedure: COLONOSCOPY;  Surgeon: Juanita Craver, MD;  Location: [facility] ENDOSCOPY;  Service: Endoscopy;  Laterality: N/A;  . COLONOSCOPY N/A 02/09/2013   Procedure: COLONOSCOPY;  Surgeon: Beryle Beams, MD;  Location: [place];  Service: Endoscopy;  Laterality: N/A;  . ESOPHAGOGASTRODUODENOSCOPY N/A 02/07/2013   Procedure: ESOPHAGOGASTRODUODENOSCOPY (EGD);  Surgeon: Juanita Craver, MD;  Location: [facility] ENDOSCOPY;  Service: Endoscopy;  Laterality: N/A;  . GIVENS CAPSULE STUDY N/A 02/09/2013   Procedure: GIVENS CAPSULE STUDY;  Surgeon: Beryle Beams, MD;  Location: [place];  Service: Endoscopy;  Laterality: N/A;  . HERNIA REPAIR      umbilical hernia  . KNEE SURGERY Right    multiple knee surgeries due to complication of R TKA  . LAMINOTOMY  0300   c6-t2  . PACEMAKER IMPLANT N/A 04/27/2018   Procedure: PACEMAKER IMPLANT;  Surgeon: Constance Haw, MD;  Location: [place] CV LAB;  Service: Cardiovascular;  Laterality: N/A;  . PROSTATE BIOPSY N/A 04/12/2019   Procedure: BIOPSY TRANSRECTAL ULTRASONIC PROSTATE (TUBP) CYSTOSCOPY;  Surgeon: [name] Hughs, MD;  Location: [facility] ORS;  Service: Urology;  Laterality: N/A;  . SPINE SURGERY    . TEE WITHOUT CARDIOVERSION N/A 03/07/2015   Procedure: TRANSESOPHAGEAL ECHOCARDIOGRAM (TEE);  Surgeon: Josue Hector, MD;  Location[facility] ENDOSCOPY;  Service: Cardiovascular;  Laterality: N/A;  ANES TO BRING PROPOFOL PER DOCTOR  . TEE WITHOUT CARDIOVERSION N/A 06/10/2015   Procedure: TRANSESOPHAGEAL ECHOCARDIOGRAM (TEE);  Surgeon: Burnell Blanks, MD;  Location: [place];  Service: Open Heart Surgery;  Laterality: N/A;  . TOTAL KNEE ARTHROPLASTY     right   x4 left x 1   . TRANSCATHETER AORTIC VALVE REPLACEMENT, TRANSFEMORAL Left 06/10/2015   Procedure: TRANSCATHETER AORTIC VALVE REPLACEMENT, TRANSFEMORAL;  Surgeon: Burnell Blanks, MD;  Location: [place];  Service: Open Heart Surgery;  Laterality: Left;     Social History   reports that he quit smoking about 13 years ago. His smoking use included pipe. He quit after 30.00 years of use. He has never used smokeless tobacco. He reports that he does not drink alcohol and does not use drugs.   Family History   His family history includes Arthritis in his sister and sister; Other in his father; Prostate cancer in his brother. There is no history of Breast cancer or Colon cancer.   Allergies Allergies  Allergen Reactions  . Coumadin [Warfarin Sodium] Other (See Comments)    States he can't be on this-bleeds out     Home Medications  Prior to Admission medications   Medication Sig Start Date End Date Taking? Authorizing  Provider  acetaminophen (TYLENOL) 500 MG tablet Take 1,000 mg by mouth 2 (two) times daily.     [provider]  allopurinol (ZYLOPRIM) 100 MG tablet TAKE 1 TABLET BY MOUTH EVERY DAY 02/12/20  [name], MD  atorvastatin (LIPITOR) 40 MG tablet Take 1 and a half tablet daily by mouth  (60mg ) 05/18/19   [name], MD  Cholecalciferol (VITAMIN D3) 125 MCG (5000 UT) CAPS Take 5,000 Units by mouth daily.    [provider]  Coenzyme Q10 (COQ10) 100 MG CAPS Take 300 mg by mouth daily.     [provider]  CRANBERRY PO Take 1 capsule by mouth 2 (two) times a day.    [provider]  ELIQUIS 5 MG TABS tablet TAKE 1 TABLET BY MOUTH TWICE A DAY 12/17/19   Sherran Needs, NP  furosemide (LASIX) 20 MG tablet TAKE 1 TABLET BY MOUTH TWICE A DAY 02/27/20   Josue Hector, MD  gabapentin (NEURONTIN) 300 MG capsule 1 cap po bid and 2 caps po qhs 03/06/20   [name], MD  HEMATINIC/FOLIC ACID 970-2 MG TABS TAKE 1 TABLET BY MOUTH EVERY DAY Patient taking differently: 2 (two) times a week.  01/02/20   [name], MD  levocetirizine (XYZAL) 5 MG tablet Take 1 tablet (5 mg total) by mouth every evening. 03/25/20   [name], MD  Misc Natural Products (TART CHERRY ADVANCED PO) Take 1,200 mg by mouth 2 (two) times a day.     [provider]  montelukast (SINGULAIR) 10 MG tablet Take 1 tablet (10 mg total) by mouth at bedtime. 03/25/20   [name], MD  Multiple Vitamins-Minerals (CENTRUM SILVER PO) Take 1 tablet by mouth daily.    [provider]  Phenazopyridine HCl (AZO-STANDARD PO) Take by mouth.    [provider]  polyethylene glycol (MIRALAX / GLYCOLAX) packet Take 17 g by mouth daily.    [provider]  sennosides-docusate sodium (SENOKOT-S) 8.6-50 MG tablet Take 1 tablet by mouth daily.     [provider]  vitamin C (ASCORBIC ACID) 500 MG tablet Take 500 mg by mouth 2 (two) times daily.     [provider]     Critical care time: 39 minutes  Chesley Mires, MD [place] Pager - [phone] 04/16/2020, 4:30 PM

## 2020-04-16 NOTE — ED Provider Notes (Signed)
Worsening SOB. Confused on arrival. CO2 retension identified yesterday at [employer]. Left AMA. Now on Bipap and improving. Physical Exam  BP (!) 140/67   Pulse 66   Temp (!) 96 F (35.6 C) (Temporal)   Resp (!) 28   SpO2 96%   Physical Exam  ED Course/Procedures   Clinical Course as of Apr 16 1514  Wed Apr 16, 2020  1510 Reassessed patient, marked improvement in mental status, sitting up, able to speak in short phrases, follows commands, will continue BiPAP   [RD]    Clinical Course User Index [RD] Lucrezia Starch, MD    Procedures  MDM  Dr. Halford Chessman has evaluated the patient.  He advises he will admit the patient to the ICU.  He does not need intubation at this time.  They will continue to observe for any decreasing respiratory status.  Patient has improved since placed on BiPAP.      Charlesetta Shanks, MD 04/16/20 1620

## 2020-04-16 NOTE — Progress Notes (Signed)
Follow up ABG obtained on patient on bipap.  Results given to MD.  Increased RR from 15 to 18 due to patient not breathing over set rate.  Orders placed for another ABG this evening.  Tolerating bipap well at this time.  Will continue to monitor.    Ref. Range 04/16/2020 15:33  Sample type Unknown ARTERIAL  pH, Arterial Latest Ref Range: 7.35 - 7.45  7.265 (L)  pCO2 arterial Latest Ref Range: 32 - 48 mmHg 89.7 (HH)  pO2, Arterial Latest Ref Range: 83 - 108 mmHg 77 (L)  TCO2 Latest Ref Range: 22 - 32 mmol/L 43 (H)  Acid-Base Excess Latest Ref Range: 0.0 - 2.0 mmol/L 10.0 (H)  Bicarbonate Latest Ref Range: 20.0 - 28.0 mmol/L 40.7 (H)  O2 Saturation Latest Units: % 92.0  Patient temperature Unknown 98.6 F  Collection site Unknown Radial

## 2020-04-16 NOTE — Telephone Encounter (Signed)
Called and spoke with patients wife per DPR and patient was sleeping. He has now been scheduled to see Dr. Annamaria Boots tomorrow at 11:30am. Patient's wife states he only has concentrator at home and does not have portable oxygen at this time. Informed her that we need him to be on room air to get his sats and if he needs oxygen once they are here we can put him on it. She expressed understanding. If appointment time does not work out due to transportation she is going to call office back. Nothing further needed at this time.

## 2020-04-16 NOTE — Telephone Encounter (Signed)
Spoke with the pt  He states over the weekend have severe back pain, slurred speech and confusion Went to ED 04/15/20 and sats were in the 70's- was advised to use 4lpm o2 all the time and sent home  He is wondering if needs sooner f/u than Oct and also if can have rx for POC  I do not see recent qualifying numbers so would likely need ov  Please advise if he can be worked in, thanks

## 2020-04-16 NOTE — Progress Notes (Signed)
[place] Progress Note Patient Name: [name] DOB: [birth year] MRN: [id number]   Date of Service  04/16/2020  HPI/Events of Note  New admit brief note: CC: AMS.   Data: Reviewed. CTH neg. 7.32/80/104/44. improving. Cr 1.5, BNP 557, troponin trending down to 53. EKG a fib. Wbc normal. Covid neg. Vaccinated once. CxR. Low lung volumes. No definitive PNA . Film reviewed. Peri hilar -could be chf.  ECHO: 65 %,  5/ 2020.   Camera: Following commands, alert, tolerating BiPAP. Morbidly obese. HR 98, afib. 159/134. sats wave form not picking up properly. 08/27/17/40%. Ve 18 lit. Bed side RN talking to him   1) Encephalopathy- metabolic. Type 2 acute on chronic from COPD/CHF exacerbation, Morbid obesity/OSAOHS. received lasix/steroids. Oxycodone use for pain at home. . on BiPAP. CTH neg. 20 AS s/p TAVR. a fib on eleiquis.  eICU Interventions  - continue current care - BG goals < 180 - on nebs/steroids/lasix - asp precautions - prn ABG. - on ssi - on scd - on eliquis - notified bed side ICU team-NP rahul via secure chat.      Intervention Category Major Interventions: Respiratory failure - evaluation and management;Acid-Base disturbance - evaluation and management;Arrhythmia - evaluation and management Intermediate Interventions: Best-practice therapies (e.g. DVT, beta blocker, etc.) Evaluation Type: New Patient Evaluation  Elmer Sow 04/16/2020, 8:38 PM

## 2020-04-17 ENCOUNTER — Encounter (HOSPITAL_COMMUNITY): Payer: Self-pay | Admitting: Pulmonary Disease

## 2020-04-17 ENCOUNTER — Inpatient Hospital Stay (HOSPITAL_COMMUNITY): Payer: Medicare HMO

## 2020-04-17 ENCOUNTER — Ambulatory Visit: Payer: Medicare HMO | Admitting: Internal Medicine

## 2020-04-17 ENCOUNTER — Telehealth: Payer: Self-pay | Admitting: Cardiovascular Disease

## 2020-04-17 DIAGNOSIS — J9621 Acute and chronic respiratory failure with hypoxia: Secondary | ICD-10-CM | POA: Diagnosis not present

## 2020-04-17 DIAGNOSIS — J9622 Acute and chronic respiratory failure with hypercapnia: Secondary | ICD-10-CM | POA: Diagnosis not present

## 2020-04-17 LAB — BASIC METABOLIC PANEL
Anion gap: 14 (ref 5–15)
BUN: 37 mg/dL — ABNORMAL HIGH (ref 8–23)
CO2: 33 mmol/L — ABNORMAL HIGH (ref 22–32)
Calcium: 9.5 mg/dL (ref 8.9–10.3)
Chloride: 94 mmol/L — ABNORMAL LOW (ref 98–111)
Creatinine, Ser: 1.41 mg/dL — ABNORMAL HIGH (ref 0.61–1.24)
GFR calc Af Amer: 55 mL/min — ABNORMAL LOW (ref >60)
GFR calc non Af Amer: 48 mL/min — ABNORMAL LOW (ref >60)
Glucose, Bld: 104 mg/dL — ABNORMAL HIGH (ref 70–99)
Potassium: 4.3 mmol/L (ref 3.5–5.1)
Sodium: 141 mmol/L (ref 135–145)

## 2020-04-17 LAB — POCT I-STAT 7, (LYTES, BLD GAS, ICA,H+H)
Acid-Base Excess: 18 mmol/L — ABNORMAL HIGH (ref 0.0–2.0)
Bicarbonate: 39.3 mmol/L — ABNORMAL HIGH (ref 20.0–28.0)
Calcium, Ion: 1.15 mmol/L (ref 1.15–1.40)
HCT: 35 % — ABNORMAL LOW (ref 39.0–52.0)
Hemoglobin: 11.9 g/dL — ABNORMAL LOW (ref 13.0–17.0)
O2 Saturation: 99 %
Patient temperature: 98.6
Potassium: 4.2 mmol/L (ref 3.5–5.1)
Sodium: 140 mmol/L (ref 135–145)
TCO2: 40 mmol/L — ABNORMAL HIGH (ref 22–32)
pCO2 arterial: 32.7 mmHg (ref 32.0–48.0)
pH, Arterial: 7.687 (ref 7.350–7.450)
pO2, Arterial: 105 mmHg (ref 83.0–108.0)

## 2020-04-17 LAB — GLUCOSE, CAPILLARY
Glucose-Capillary: 108 mg/dL — ABNORMAL HIGH (ref 70–99)
Glucose-Capillary: 111 mg/dL — ABNORMAL HIGH (ref 70–99)
Glucose-Capillary: 112 mg/dL — ABNORMAL HIGH (ref 70–99)
Glucose-Capillary: 123 mg/dL — ABNORMAL HIGH (ref 70–99)
Glucose-Capillary: 164 mg/dL — ABNORMAL HIGH (ref 70–99)
Glucose-Capillary: 242 mg/dL — ABNORMAL HIGH (ref 70–99)

## 2020-04-17 LAB — CBC
HCT: 37.6 % — ABNORMAL LOW (ref 39.0–52.0)
Hemoglobin: 11.9 g/dL — ABNORMAL LOW (ref 13.0–17.0)
MCH: 32 pg (ref 26.0–34.0)
MCHC: 31.6 g/dL (ref 30.0–36.0)
MCV: 101.1 fL — ABNORMAL HIGH (ref 80.0–100.0)
Platelets: 127 10*3/uL — ABNORMAL LOW (ref 150–400)
RBC: 3.72 MIL/uL — ABNORMAL LOW (ref 4.22–5.81)
RDW: 16.4 % — ABNORMAL HIGH (ref 11.5–15.5)
WBC: 5.7 10*3/uL (ref 4.0–10.5)
nRBC: 0.5 % — ABNORMAL HIGH (ref 0.0–0.2)

## 2020-04-17 LAB — MRSA PCR SCREENING: MRSA by PCR: NEGATIVE

## 2020-04-17 MED ORDER — ARFORMOTEROL TARTRATE 15 MCG/2ML IN NEBU
15.0000 ug | INHALATION_SOLUTION | Freq: Two times a day (BID) | RESPIRATORY_TRACT | Status: DC
  Administered 2020-04-17 – 2020-04-20 (×7): 15 ug via RESPIRATORY_TRACT
  Filled 2020-04-17 (×7): qty 2

## 2020-04-17 MED ORDER — FUROSEMIDE 10 MG/ML IJ SOLN
40.0000 mg | Freq: Two times a day (BID) | INTRAMUSCULAR | Status: DC
  Administered 2020-04-17 – 2020-04-19 (×5): 40 mg via INTRAVENOUS
  Filled 2020-04-17 (×6): qty 4

## 2020-04-17 MED ORDER — ATORVASTATIN CALCIUM 40 MG PO TABS
60.0000 mg | ORAL_TABLET | Freq: Every day | ORAL | Status: DC
  Administered 2020-04-17 – 2020-04-20 (×4): 60 mg via ORAL
  Filled 2020-04-17: qty 2
  Filled 2020-04-17: qty 1
  Filled 2020-04-17 (×2): qty 2

## 2020-04-17 MED ORDER — AMMONIUM LACTATE 12 % EX LOTN
TOPICAL_LOTION | Freq: Two times a day (BID) | CUTANEOUS | Status: DC
  Administered 2020-04-19 – 2020-04-20 (×2): 1 via TOPICAL
  Filled 2020-04-17: qty 225

## 2020-04-17 MED ORDER — BUDESONIDE 0.25 MG/2ML IN SUSP
0.2500 mg | Freq: Two times a day (BID) | RESPIRATORY_TRACT | Status: DC
  Administered 2020-04-17 – 2020-04-20 (×7): 0.25 mg via RESPIRATORY_TRACT
  Filled 2020-04-17 (×7): qty 2

## 2020-04-17 MED ORDER — ALLOPURINOL 100 MG PO TABS
100.0000 mg | ORAL_TABLET | Freq: Every day | ORAL | Status: DC
  Administered 2020-04-17 – 2020-04-20 (×4): 100 mg via ORAL
  Filled 2020-04-17 (×4): qty 1

## 2020-04-17 MED ORDER — FUROSEMIDE 20 MG PO TABS: 20.0000 mg | ORAL_TABLET | Freq: Two times a day (BID) | ORAL | Status: DC

## 2020-04-17 MED ORDER — REVEFENACIN 175 MCG/3ML IN SOLN
175.0000 ug | Freq: Every day | RESPIRATORY_TRACT | Status: DC
  Administered 2020-04-17 – 2020-04-19 (×3): 175 ug via RESPIRATORY_TRACT
  Filled 2020-04-17 (×4): qty 3

## 2020-04-17 MED ORDER — APIXABAN 5 MG PO TABS
5.0000 mg | ORAL_TABLET | Freq: Two times a day (BID) | ORAL | Status: DC
  Administered 2020-04-17 – 2020-04-20 (×7): 5 mg via ORAL
  Filled 2020-04-17 (×7): qty 1

## 2020-04-17 NOTE — H&P (Addendum)
NAME:  ANGELA PLATNER, MRN:  [id number], DOB:  June 22, 1941, LOS: 1 ADMISSION DATE:  04/16/2020, CONSULTATION DATE:  04/16/2020 REFERRING MD:  Dr. Roslynn Amble, ER, CHIEF COMPLAINT:  Respiratory failure  Brief History   79 yo male former smoker brought to ER with altered mental status.  Found to have acute on chronic hypoxic/hypercapnic respiratory failure.  Followed by Dr. Annamaria Boots in pulmonary office for COPD and OSA/OHS.  Has chronic back pain.  Family reports he took extra oxycodone couple days prior to admission.  History of present illness   Hx from chart and family.  Family reports he has chronic pain issues.  He had extra oxycodone at home.  He reportedly took doses on 7/23 and 7/24.  Family unsure if he took after this.  On 7/27 he was noted to be confused and non verbal.  Family brought him to ER.  He was found to have acute on chronic hypoxic/hypercapnic respiratory failure.  CT head was negative except for partially calcified meningioma.  He was advised to increase his supplemental oxygen to 4 liters.  He opted against admission at that time.    Wife reports he was back to normal status in AM of 7/28.  He went to take a nap in the afternoon of 7/28 and was wearing 4 liters oxygen.  After he fell asleep, family was not able to arouse him.  He was brought back to ER.  Found to have worsening hypercapnia.  Started on Bipap with some improvement in ABG and mental status.  Family denies any cough, sputum, wheeze, fever, chest pain.  He has chronic leg swelling.  He completed Moderna COVID vaccination in March 2021.  Past Medical History  Venous stasis dermatitis, Thrombocytopenia, RBBB, Prostate cancer, Persistent A fib on eliquis, Foot drop, Peripheral neuropathy, OA, OSA, Back pain, HTN, HLD, Shingles, Gout, Aortic stenosis s/p TAVR, Anemia, COPD  Significant Hospital Events   7/28 Admit  Consults:    Procedures:    Significant Diagnostic Tests:    Micro Data:  COVID 7/28 >>    Antimicrobials:     Interim history/subjective:  Patient alert on exam. ABG much improved this morning. He says he is thirsty and would like for me to update his wife. Otherwise no concerns.   Objective   Blood pressure (!) 135/61, pulse 59, temperature 98.6 F (37 C), temperature source Axillary, resp. rate 16, height 6\' 2"  (1.88 m), weight (!) 173.7 kg, SpO2 100 %.    Vent Mode: BIPAP FiO2 (%):  [40 %] 40 % Set Rate:  [14 bmp-18 bmp] 14 bmp PEEP:  [8 cmH20] 8 cmH20   Intake/Output Summary (Last 24 hours) at 04/17/2020 0823 Last data filed at 04/17/2020 0600 Gross per 24 hour  Intake --  Output 3800 ml  Net -3800 ml   Filed Weights   04/16/20 2035  Weight: (!) 173.7 kg    Examination: General: NAD, off BIPAP - on 5L HFNC , chronically ill appearing  HE: Normocephalic, atraumatic , EOMI, Conjunctivae pale ENT: No congestion, no rhinorrhea, no exudate or erythema  Cardiovascular: Normal rate, regular rhythm.  No murmurs, rubs, or gallops Pulmonary : Effort normal, breath sounds normal. No wheezes, rales, or rhonchi Abdominal: soft, nontender, large panus,  bowel sounds present Ext: 2+ edema , no injury   Skin: Chronic venous statis changes in bilateral lower ext, warm  Psychiatric/Behavioral:  normal mood, normal behavior  Neuro: Alert and oriented x 3  Cxray personally reviewed: Small left pleural effusion,  no consolidations or pneumothorax  Noted labs: Cr 1.41, Baseline ~1.3 , BUN 37 K 4.3 CO2 33  ABG    Component Value Date/Time   PHART 7.687 (HH) 04/17/2020 0502   PCO2ART 32.7 04/17/2020 0502   PO2ART 105 04/17/2020 0502   HCO3 39.3 (H) 04/17/2020 0502   TCO2 40 (H) 04/17/2020 0502   O2SAT 99.0 04/17/2020 0502  Platelets 127 Hgb 11.9 WBC 5.7 Resolved Hospital Problem list   Acute metabolic encephalopathy from hypoxia, hypercapnia.  Assessment & Plan:   Acute on chronic hypoxic/hypercapnic respiratory failure Acute on chronic diastolic  CHF COPD OSA/OHS Patient has chronic respiratory failure and on 3L [place] at home. Hypercarbic and elevated BNP on admission. Improved with Bipap , holding opiate medication, and diuresing. On review of weight for past two years I would estimate dry weight is around 152-154 kg. 173.7 kg today.  Plan: - goal SpO2 88 to 92% - Pulmicort, Brovana, Yulperi scheduled  - PRN albuterol  - lasix 40 mg IV BID  - Off BiPAP, PRN order in. QHS CPAP ordered.  - Transfer to med- tele.  - Strict ins and outs - Daily weights   Hx of chronic back pain, peripheral neuropathy. - avoid opiates - held neurontin  Hx of permanent a fib present on admission. [employer] PPM dual chamber Hx of HTN, s/p TAVR, HLD. - In sinus on exam - lasix IV as above - restarted output lipitor, eliquis this am  Steroid induced hyperglycemia. - Started diet this AM - SSI  CKD 3a. -Trend with BMET  Morbid obesity. - BMI 49 - weight loss  Chronic venous stasis changes of lower legs. - wound care has seen, twice daily lac-hydrin lotion   Best practice:  Diet: HH/CM DVT prophylaxis: SCDs GI prophylaxis: Protonix Mobility: bed rest Code Status: full code Family Communication: updated family at bedside Disposition: ICU  Labs   CBC: Recent Labs  Lab 04/15/20 2152 04/15/20 2236 04/16/20 1428 04/16/20 1533 04/16/20 1816 04/17/20 0337 04/17/20 0502  WBC 7.0  --  7.0  --   --  5.7  --   NEUTROABS 5.5  --  5.7  --   --   --   --   HGB 11.6*   < > 12.4* 12.9* 12.6* 11.9* 11.9*  HCT 38.1*   < > 42.5 38.0* 37.0* 37.6* 35.0*  MCV 103.5*  --  107.9*  --   --  101.1*  --   PLT 150  --  139*  --   --  127*  --    < > = values in this interval not displayed.    Basic Metabolic Panel: Recent Labs  Lab 04/15/20 2152 04/15/20 2236 04/16/20 1428 04/16/20 1533 04/16/20 1816 04/17/20 0337 04/17/20 0502  NA 139   < > 140 138 139 141 140  K 4.5   < > 4.7 4.4 4.4 4.3 4.2  CL 93*  --  95*  --   --  94*  --   CO2 35*   --  34*  --   --  33*  --   GLUCOSE 112*  --  124*  --   --  104*  --   BUN 40*  --  39*  --   --  37*  --   CREATININE 1.49*  --  1.58*  --   --  1.41*  --   CALCIUM 8.9  --  9.4  --   --  9.5  --  MG  --   --  2.3  --   --   --   --    < > = values in this interval not displayed.   GFR: Estimated Creatinine Clearance: 73.7 mL/min (A) (by C-G formula based on SCr of 1.41 mg/dL (H)). Recent Labs  Lab 04/15/20 2152 04/16/20 1428 04/17/20 0337  WBC 7.0 7.0 5.7    Liver Function Tests: Recent Labs  Lab 04/15/20 2152  AST 23  ALT 25  ALKPHOS 58  BILITOT 0.6  PROT 6.4*  ALBUMIN 3.3*   No results for input(s): LIPASE, AMYLASE in the last 168 hours. Recent Labs  Lab 04/15/20 2227  AMMONIA 20    ABG    Component Value Date/Time   PHART 7.687 (HH) 04/17/2020 0502   PCO2ART 32.7 04/17/2020 0502   PO2ART 105 04/17/2020 0502   HCO3 39.3 (H) 04/17/2020 0502   TCO2 40 (H) 04/17/2020 0502   O2SAT 99.0 04/17/2020 0502     Coagulation Profile: No results for input(s): INR, PROTIME in the last 168 hours.  Cardiac Enzymes: No results for input(s): CKTOTAL, CKMB, CKMBINDEX, TROPONINI in the last 168 hours.  HbA1C: Hgb A1c MFr Bld  Date/Time Value Ref Range Status  03/10/2020 02:01 PM 5.7 4.6 - 6.5 % Final    Comment:    Glycemic Control Guidelines for People with Diabetes:Non Diabetic:  <6%Goal of Therapy: <7%Additional Action Suggested:  >8%   11/20/2019 10:30 AM 5.6 4.6 - 6.5 % Final    Comment:    Glycemic Control Guidelines for People with Diabetes:Non Diabetic:  <6%Goal of Therapy: <7%Additional Action Suggested:  >8%     CBG: Recent Labs  Lab 04/16/20 1758 04/16/20 2038 04/16/20 2346 04/17/20 0322 04/17/20 0809  GLUCAP 89 95 120* 108* 112*    Review of Systems:   Unable to obtain  Past Medical History  He,  has a past medical history of 1st degree AV block (11/09/2018), Anemia, Aortic stenosis, severe, Arthritis, Back pain, Bursitis, Cataract,  Cellulitis (10/12/2015), CHF (congestive heart failure) ([place]), Complication of anesthesia, Constipation, DDD (degenerative disc disease), Gout, Heart valve disorder, History of blood clots (1962), History of blood transfusion, History of shingles, Hyperlipidemia, Hypertension, Joint pain, Low back pain (01/25/2017), Morbid obesity ([place]), Neck pain on left side (01/25/2017), OSA (obstructive sleep apnea), Osteoarthritis, Peripheral edema, Peripheral neuropathy, Peroneal palsy, Persistent atrial fibrillation ([place]), Pneumonia (25+yrs ago), Prostate cancer ([place]), Prostate cancer ([place]) (02/13/2019), RBBB (11/09/2018), Shortness of breath, Swelling of extremity, Thrombocytopenia ([place]), Urinary frequency, Urinary urgency, Valvular heart disease, and Venous stasis dermatitis.   Surgical History    Past Surgical History:  Procedure Laterality Date  . CARDIAC CATHETERIZATION N/A 05/02/2015   Procedure: Right/Left Heart Cath and Coronary Angiography;  Surgeon: Burnell Blanks, MD;  Location: [place] [facility];  Service: Cardiovascular;  Laterality: N/A;  . CARDIOVERSION N/A 10/27/2018   Procedure: CARDIOVERSION;  Surgeon: Fay Records, MD;  Location: [facility] ENDOSCOPY;  Service: Cardiovascular;  Laterality: N/A;  . COLONOSCOPY N/A 02/07/2013   Procedure: COLONOSCOPY;  Surgeon: Juanita Craver, MD;  Location[facility] ENDOSCOPY;  Service: Endoscopy;  Laterality: N/A;  . COLONOSCOPY N/A 02/09/2013   Procedure: COLONOSCOPY;  Surgeon: Beryle Beams, MD;  Location: [place];  Service: Endoscopy;  Laterality: N/A;  . ESOPHAGOGASTRODUODENOSCOPY N/A 02/07/2013   Procedure: ESOPHAGOGASTRODUODENOSCOPY (EGD);  Surgeon: Juanita Craver, MD;  Location: [facility] ENDOSCOPY;  Service: Endoscopy;  Laterality: N/A;  . GIVENS CAPSULE STUDY N/A 02/09/2013   Procedure: GIVENS CAPSULE STUDY;  Surgeon:  Beryle Beams, MD;  Location: [place];  Service: Endoscopy;  Laterality: N/A;  . HERNIA REPAIR     umbilical hernia  . KNEE SURGERY Right     multiple knee surgeries due to complication of R TKA  . LAMINOTOMY  4782   c6-t2  . PACEMAKER IMPLANT N/A 04/27/2018   Procedure: PACEMAKER IMPLANT;  Surgeon: Constance Haw, MD;  Location: [place] [facility];  Service: Cardiovascular;  Laterality: N/A;  . PROSTATE BIOPSY N/A 04/12/2019   Procedure: BIOPSY TRANSRECTAL ULTRASONIC PROSTATE (TUBP) CYSTOSCOPY;  Surgeon: [name] Hughs, MD;  Location: [facility];  Service: Urology;  Laterality: N/A;  . SPINE SURGERY    . TEE WITHOUT CARDIOVERSION N/A 03/07/2015   Procedure: TRANSESOPHAGEAL ECHOCARDIOGRAM (TEE);  Surgeon: Josue Hector, MD;  Location: [facility] ENDOSCOPY;  Service: Cardiovascular;  Laterality: N/A;  ANES TO BRING PROPOFOL PER DOCTOR  . TEE WITHOUT CARDIOVERSION N/A 06/10/2015   Procedure: TRANSESOPHAGEAL ECHOCARDIOGRAM (TEE);  Surgeon: Burnell Blanks, MD;  Location: [place];  Service: Open Heart Surgery;  Laterality: N/A;  . TOTAL KNEE ARTHROPLASTY     right   x4 left x 1   . TRANSCATHETER AORTIC VALVE REPLACEMENT, TRANSFEMORAL Left 06/10/2015   Procedure: TRANSCATHETER AORTIC VALVE REPLACEMENT, TRANSFEMORAL;  Surgeon: Burnell Blanks, MD;  Location: [place];  Service: Open Heart Surgery;  Laterality: Left;     Social History   reports that he quit smoking about 13 years ago. His smoking use included pipe. He quit after 30.00 years of use. He has never used smokeless tobacco. He reports that he does not drink alcohol and does not use drugs.   Family History   His family history includes Arthritis in his sister and sister; Other in his father; Prostate cancer in his brother. There is no history of Breast cancer or Colon cancer.   Allergies Allergies  Allergen Reactions  . Coumadin [Warfarin Sodium] Other (See Comments)    States he can't be on this-bleeds out     Home Medications  Prior to Admission medications   Medication Sig Start Date End Date Taking? Authorizing Provider  acetaminophen (TYLENOL) 500 MG tablet  Take 1,000 mg by mouth 2 (two) times daily.     [provider]  allopurinol (ZYLOPRIM) 100 MG tablet TAKE 1 TABLET BY MOUTH EVERY DAY 02/12/20   [name], MD  atorvastatin (LIPITOR) 40 MG tablet Take 1 and a half tablet daily by mouth  (60mg ) 05/18/19   [name], MD  Cholecalciferol (VITAMIN D3) 125 MCG (5000 UT) CAPS Take 5,000 Units by mouth daily.    [provider]  Coenzyme Q10 (COQ10) 100 MG CAPS Take 300 mg by mouth daily.     [provider]  CRANBERRY PO Take 1 capsule by mouth 2 (two) times a day.    [provider]  ELIQUIS 5 MG TABS tablet TAKE 1 TABLET BY MOUTH TWICE A DAY 12/17/19   Sherran Needs, NP  furosemide (LASIX) 20 MG tablet TAKE 1 TABLET BY MOUTH TWICE A DAY 02/27/20   Josue Hector, MD  gabapentin (NEURONTIN) 300 MG capsule 1 cap po bid and 2 caps po qhs 03/06/20   [name], MD  HEMATINIC/FOLIC ACID 956-2 MG TABS TAKE 1 TABLET BY MOUTH EVERY DAY Patient taking differently: 2 (two) times a week.  01/02/20   [name], MD  levocetirizine (XYZAL) 5 MG tablet Take 1 tablet (5 mg total) by mouth every  evening. 03/25/20   [name], MD  Misc Natural Products (TART CHERRY ADVANCED PO) Take 1,200 mg by mouth 2 (two) times a day.     [provider]  montelukast (SINGULAIR) 10 MG tablet Take 1 tablet (10 mg total) by mouth at bedtime. 03/25/20   [name], MD  Multiple Vitamins-Minerals (CENTRUM SILVER PO) Take 1 tablet by mouth daily.    [provider]  Phenazopyridine HCl (AZO-STANDARD PO) Take by mouth.    [provider]  polyethylene glycol (MIRALAX / GLYCOLAX) packet Take 17 g by mouth daily.    [provider]  sennosides-docusate sodium (SENOKOT-S) 8.6-50 MG tablet Take 1 tablet by mouth daily.     [provider]  vitamin C (ASCORBIC ACID) 500 MG tablet Take 500 mg by mouth 2 (two) times daily.     [provider]     Critical care time:   Tamsen Snider, MD PGY2 Internal Medicine [phone]   PCCM:  79 yo M, admitted for hypercapenic respiratory failure on BIPAP therapy.  Now doing well off BIPAP Complaining of dry mouth   BP 124/65   Pulse 64   Temp 98.3 F (36.8 C) (Oral)   Resp 15   Ht 6\' 2"  (1.88 m)   Wt (!) 173.7 kg   SpO2 99%   BMI 49.17 kg/m   Gen: elderly obese male,  HENT: large neck  HEART: irregular, s1 s2 Lungs: diminshed in bases, no wheeze  Ext: chronic stasis changes, BL LE  Labs: Reviewed   A: Acute on Chronic hypercapenic respiratory failure  S/p BIPAP therapy  OSA OHS COPD Acute on chronic diastolic heart failure Chronic pain, chronic opiate use   P: Scheduled nebs  ICS, LABA, LAMA IV lasix  Follow uop Prn albuterol  Need PAP therapy with sleep  Restart eliquis  Stable for transfer from ICU   We will reach out to [facility] for pick up tomorrow.   [facility] 04/17/2020 1:14 PM

## 2020-04-17 NOTE — Progress Notes (Signed)
Patient signed out to [facility] and they will take over care on 7/30 at 7am. Transfer order has been placed.

## 2020-04-17 NOTE — Consult Note (Signed)
[place] Nurse Consult Note: Patient receiving care in [facility] 3M10. Reason for Consult: "bilateral chronic leg skin issues" Wound type: NO wounds present Pressure Injury POA: Yes/No/NA Measurement: Wound bed: Drainage (amount, consistency, odor)  Periwound: Dressing procedure/placement/frequency: The BLE skin is consistent with venous insufficiency--hemosiderin staining, thick, dry, scaling skin.  Patient states he used to were compression hose.  I explained that when he is out of the hospital he needs to go back to using compression hose.  He verbalized his understanding. For now, I have ordered twice daily application of Lac-Hydrin lotion to the affected areas. Thank you for the consult.  Discussed plan of care with the patient.  [place] nurse will not follow at this time.  Please re-consult the [place] team if needed.  Val Riles, RN, MSN, CWOCN, CNS-BC, pager [phone]

## 2020-04-17 NOTE — Progress Notes (Signed)
[facility] Progress Note Patient Name: [name] DOB: 04-25-1941 MRN: [id number]   Date of Service  04/17/2020  HPI/Events of Note  Pt on BiPAP RT called critical ABG Ph 7.68, PC02 35.7, P02 105, Bicarb 39.3  eICU Interventions  Decreased rate from 18 to 14, ABG at 7 AM. Notified RT .      Intervention Category Intermediate Interventions: Other:  Elmer Sow 04/17/2020, 5:14 AM

## 2020-04-17 NOTE — Telephone Encounter (Signed)
Patient's son, Collier [place]ina, called to see if the echo that was scheduled for yesterday 7/28, can be done while the patient is hospitalized at [facility]. Please advise.

## 2020-04-17 NOTE — Telephone Encounter (Signed)
Informed patient that the order for echo is in his chart and he can ask his nurse if that is possible. Informed patient that it might be cheaper to do it out patient. Patient verbalized understanding.

## 2020-04-18 DIAGNOSIS — J9601 Acute respiratory failure with hypoxia: Secondary | ICD-10-CM

## 2020-04-18 DIAGNOSIS — J449 Chronic obstructive pulmonary disease, unspecified: Secondary | ICD-10-CM | POA: Diagnosis not present

## 2020-04-18 DIAGNOSIS — I872 Venous insufficiency (chronic) (peripheral): Secondary | ICD-10-CM

## 2020-04-18 DIAGNOSIS — I4819 Other persistent atrial fibrillation: Secondary | ICD-10-CM

## 2020-04-18 DIAGNOSIS — J9621 Acute and chronic respiratory failure with hypoxia: Secondary | ICD-10-CM | POA: Diagnosis not present

## 2020-04-18 DIAGNOSIS — J9602 Acute respiratory failure with hypercapnia: Secondary | ICD-10-CM

## 2020-04-18 DIAGNOSIS — G4733 Obstructive sleep apnea (adult) (pediatric): Secondary | ICD-10-CM

## 2020-04-18 DIAGNOSIS — J9611 Chronic respiratory failure with hypoxia: Secondary | ICD-10-CM

## 2020-04-18 DIAGNOSIS — I10 Essential (primary) hypertension: Secondary | ICD-10-CM

## 2020-04-18 LAB — CBC WITH DIFFERENTIAL/PLATELET
Abs Immature Granulocytes: 0.05 10*3/uL (ref 0.00–0.07)
Basophils Absolute: 0 10*3/uL (ref 0.0–0.1)
Basophils Relative: 0 %
Eosinophils Absolute: 0 10*3/uL (ref 0.0–0.5)
Eosinophils Relative: 0 %
HCT: 38.2 % — ABNORMAL LOW (ref 39.0–52.0)
Hemoglobin: 12.1 g/dL — ABNORMAL LOW (ref 13.0–17.0)
Immature Granulocytes: 1 %
Lymphocytes Relative: 9 %
Lymphs Abs: 0.8 10*3/uL (ref 0.7–4.0)
MCH: 31.9 pg (ref 26.0–34.0)
MCHC: 31.7 g/dL (ref 30.0–36.0)
MCV: 100.8 fL — ABNORMAL HIGH (ref 80.0–100.0)
Monocytes Absolute: 0.5 10*3/uL (ref 0.1–1.0)
Monocytes Relative: 6 %
Neutro Abs: 7.9 10*3/uL — ABNORMAL HIGH (ref 1.7–7.7)
Neutrophils Relative %: 84 %
Platelets: 168 10*3/uL (ref 150–400)
RBC: 3.79 MIL/uL — ABNORMAL LOW (ref 4.22–5.81)
RDW: 17 % — ABNORMAL HIGH (ref 11.5–15.5)
WBC: 9.3 10*3/uL (ref 4.0–10.5)
nRBC: 0.2 % (ref 0.0–0.2)

## 2020-04-18 LAB — BASIC METABOLIC PANEL
Anion gap: 11 (ref 5–15)
BUN: 44 mg/dL — ABNORMAL HIGH (ref 8–23)
CO2: 36 mmol/L — ABNORMAL HIGH (ref 22–32)
Calcium: 8.8 mg/dL — ABNORMAL LOW (ref 8.9–10.3)
Chloride: 92 mmol/L — ABNORMAL LOW (ref 98–111)
Creatinine, Ser: 1.4 mg/dL — ABNORMAL HIGH (ref 0.61–1.24)
GFR calc Af Amer: 56 mL/min — ABNORMAL LOW (ref >60)
GFR calc non Af Amer: 48 mL/min — ABNORMAL LOW (ref >60)
Glucose, Bld: 105 mg/dL — ABNORMAL HIGH (ref 70–99)
Potassium: 4.9 mmol/L (ref 3.5–5.1)
Sodium: 139 mmol/L (ref 135–145)

## 2020-04-18 LAB — GLUCOSE, CAPILLARY
Glucose-Capillary: 111 mg/dL — ABNORMAL HIGH (ref 70–99)
Glucose-Capillary: 103 mg/dL — ABNORMAL HIGH (ref 70–99)
Glucose-Capillary: 110 mg/dL — ABNORMAL HIGH (ref 70–99)
Glucose-Capillary: 131 mg/dL — ABNORMAL HIGH (ref 70–99)
Glucose-Capillary: 110 mg/dL — ABNORMAL HIGH (ref 70–99)
Glucose-Capillary: 117 mg/dL — ABNORMAL HIGH (ref 70–99)

## 2020-04-18 LAB — PHOSPHORUS: Phosphorus: 4.1 mg/dL (ref 2.5–4.6)

## 2020-04-18 LAB — MAGNESIUM: Magnesium: 2.3 mg/dL (ref 1.7–2.4)

## 2020-04-18 NOTE — Progress Notes (Addendum)
PROGRESS NOTE  [name] ZGY:[id number] DOB: Aug 28, 1941 DOA: 04/16/2020 PCP: Lance Lukes, MD   LOS: 2 days   Brief narrative: As per HPI,  79 yo male with past medical history of Venous stasis dermatitis, Thrombocytopenia, RBBB, Prostate cancer, Persistent A fib on eliquis, Foot drop, Peripheral neuropathy, OA, OSA, Back pain, HTN, HLD, Shingles, Gout, Aortic stenosis s/p TAVR, Anemia, COPD former smoker was brought to ER with altered mental status.  Found to have acute on chronic hypoxic/hypercapnic respiratory failure.  Followed by Dr. Annamaria Boots in pulmonary office for COPD and OSA/OHS.  Has chronic back pain.  Family reported he took extra oxycodone couple days prior to admission.  Patient was then admitted to the ICU with BiPAP..  CT head scan was negative.  Assessment/Plan:  Principal Problem:   Acute on chronic respiratory failure with hypoxia and hypercapnia (HCC) Active Problems:   OBESITY, MORBID   Obstructive sleep apnea   Chronic venous insufficiency   COPD mixed type (HCC)   Essential hypertension   Persistent atrial fibrillation (HCC)   Chronic respiratory failure with hypoxia (HCC)  Acute on chronic hypoxic/hypercapnic respiratory failure secondary to COPD exacerbation. Acute on chronic diastolic CHF, OSA/OHS History of  chronic respiratory failure on 3L [place] at home.  Patient uses a trilogy at home.  Patient initially required BiPAP.  Opiates on hold.  Patient also had elevated BNP.  Continue Pulmicort, Garlon Hatchet, Yulperi scheduled.  Continue nebulizers, IV Lasix.  Has been started on CPAP at nighttime.  Has been having good diuresis.  Continue IV steroids for possible COPD flare.  Hx of chronic back pain, peripheral neuropathy. Was on opiates and Neurontin as outpatient.  Could restart Neurontin as necessary.  Hx of permanent a fib present on admission.  Status post St.Jude PPM dual chamber. Hx of HTN, s/p TAVR, HLD. Sinus rhythm at this time.  On Lasix,  Lipitor,  Eliquis.  On iv lasix. Steroid induced hyperglycemia. Sliding scale insulin Accu-Cheks.   CKD 3a. Closely monitor BMP.  Check BMP in a.m.  Morbid obesity. Patient would benefit from weight loss.  BMI 49.  Chronic venous stasis changes of lower legs. Wound care to continue  Debility, deconditioning Physical therapy recommend home PT on discharge.  Patient is from home.  DVT prophylaxis: Place and maintain sequential compression device Start: 04/16/20 1600 apixaban (ELIQUIS) tablet 5 mg   Code Status: Full code  Family Communication: Spoke with the patient's son and the wife at bedside.  Status is: Inpatient  Remains inpatient appropriate because:Inpatient level of care appropriate due to severity of illness   Dispo: The patient is from: Home.  Patient stated he has electric wheelchair and walker at home and lives with his family.              Anticipated d/c is to: Home home health               Anticipated d/c date is: 2 day              Patient currently is not medically stable to d/c.   Consultants:  PCCM  Procedures:  BiPAP  Antibiotics:  . None  Anti-infectives (From admission, onward)   None     Subjective:  Today, patient was seen and examined at bedside.  Has shortness of breath and dyspnea.  Objective: Vitals:   04/17/20 1919 04/17/20 2327  BP:  (!) 143/69  Pulse:  61  Resp:  18  Temp:  97.6 F (36.4 C)  SpO2: 98% 96%    Intake/Output Summary (Last 24 hours) at 04/18/2020 0707 Last data filed at 04/18/2020 0548 Gross per 24 hour  Intake 240 ml  Output 650 ml  Net -410 ml   Filed Weights   04/16/20 2035  Weight: (!) 173.7 kg   Body mass index is 49.17 kg/m.   Physical Exam: GENERAL: Patient is alert awake and oriented. Not in obvious distress.  Morbidly obese, chronically ill HENT: Mild pallor noted, pupils equally reactive to light. Oral mucosa is moist NECK: is supple, no gross swelling noted. CHEST: Diminished breath sounds  bilaterally. CVS: S1 and S2 heard, no murmur. Regular rate and rhythm.  ABDOMEN: Soft, non-tender, bowel sounds are present.  Large pannus noted EXTREMITIES: Chronic venous insufficiency noted over the bilateral lower extremity,  Bilateral lower extremity pitting edema CNS: Cranial nerves are intact. No focal motor deficits. SKIN: warm and dry ,  Data Review: I have personally reviewed the following laboratory data and studies,  CBC: Recent Labs  Lab 04/15/20 2152 04/15/20 2236 04/16/20 1428 04/16/20 1428 04/16/20 1533 04/16/20 1816 04/17/20 0337 04/17/20 0502 04/18/20 0040  WBC 7.0  --  7.0  --   --   --  5.7  --  9.3  NEUTROABS 5.5  --  5.7  --   --   --   --   --  7.9*  HGB 11.6*   < > 12.4*   < > 12.9* 12.6* 11.9* 11.9* 12.1*  HCT 38.1*   < > 42.5   < > 38.0* 37.0* 37.6* 35.0* 38.2*  MCV 103.5*  --  107.9*  --   --   --  101.1*  --  100.8*  PLT 150  --  139*  --   --   --  127*  --  168   < > = values in this interval not displayed.   Basic Metabolic Panel: Recent Labs  Lab 04/15/20 2152 04/15/20 2236 04/16/20 1428 04/16/20 1428 04/16/20 1533 04/16/20 1816 04/17/20 0337 04/17/20 0502 04/18/20 0040  NA 139   < > 140   < > 138 139 141 140 139  K 4.5   < > 4.7   < > 4.4 4.4 4.3 4.2 4.9  CL 93*  --  95*  --   --   --  94*  --  92*  CO2 35*  --  34*  --   --   --  33*  --  36*  GLUCOSE 112*  --  124*  --   --   --  104*  --  105*  BUN 40*  --  39*  --   --   --  37*  --  44*  CREATININE 1.49*  --  1.58*  --   --   --  1.41*  --  1.40*  CALCIUM 8.9  --  9.4  --   --   --  9.5  --  8.8*  MG  --   --  2.3  --   --   --   --   --  2.3  PHOS  --   --   --   --   --   --   --   --  4.1   < > = values in this interval not displayed.   Liver Function Tests: Recent Labs  Lab 04/15/20 2152  AST 23  ALT 25  ALKPHOS 58  BILITOT 0.6  PROT 6.4*  ALBUMIN 3.3*   No results for input(s): LIPASE, AMYLASE in the last 168 hours. Recent Labs  Lab 04/15/20 2227  AMMONIA  20   Cardiac Enzymes: No results for input(s): CKTOTAL, CKMB, CKMBINDEX, TROPONINI in the last 168 hours. BNP (last 3 results) Recent Labs    04/15/20 2152 04/16/20 1428  BNP 737.0* 557.9*    ProBNP (last 3 results) No results for input(s): PROBNP in the last 8760 hours.  CBG: Recent Labs  Lab 04/17/20 1133 04/17/20 1623 04/17/20 1933 04/17/20 2332 04/18/20 0311  GLUCAP 123* 164* 242* 111* 111*   Recent Results (from the past 240 hour(s))  SARS Coronavirus 2 by RT PCR (hospital order, performed in [facility] hospital lab) Nasopharyngeal Nasopharyngeal Swab     Status: None   Collection Time: 04/16/20  2:28 PM   Specimen: Nasopharyngeal Swab  Result Value Ref Range Status   SARS Coronavirus 2 NEGATIVE NEGATIVE Final    Comment: (NOTE) SARS-CoV-2 target nucleic acids are NOT DETECTED.  The SARS-CoV-2 RNA is generally detectable in upper and lower respiratory specimens during the acute phase of infection. The lowest concentration of SARS-CoV-2 viral copies this assay can detect is 250 copies / mL. A negative result does not preclude SARS-CoV-2 infection and should not be used as the sole basis for treatment or other patient management decisions.  A negative result may occur with improper specimen collection / handling, submission of specimen other than nasopharyngeal swab, presence of viral mutation(s) within the areas targeted by this assay, and inadequate number of viral copies (<250 copies / mL). A negative result must be combined with clinical observations, patient history, and epidemiological information.  Fact Sheet for Patients:   StrictlyIdeas.no  Fact Sheet for Healthcare Providers: BankingDealers.co.za  This test is not yet approved or  cleared by the [place] FDA and has been authorized for detection and/or diagnosis of SARS-CoV-2 by FDA under an Emergency Use Authorization (EUA).  This EUA will  remain in effect (meaning this test can be used) for the duration of the COVID-19 declaration under Section 564(b)(1) of the Act, 21 U.S.C. section 360bbb-3(b)(1), unless the authorization is terminated or revoked sooner.  Performed at [facility], [place] [address], [place], [place] [address]   MRSA PCR Screening     Status: None   Collection Time: 04/16/20  8:40 PM   Specimen: Nasal Mucosa; Nasopharyngeal  Result Value Ref Range Status   MRSA by PCR NEGATIVE NEGATIVE Final    Comment:        The GeneXpert MRSA Assay (FDA approved for NASAL specimens only), is one component of a comprehensive MRSA colonization surveillance program. It is not intended to diagnose MRSA infection nor to guide or monitor treatment for MRSA infections. Performed at [facility], [place] [address], [place], [place] [address]      Studies: DG Chest Port 1 View  Result Date: 04/17/2020 CLINICAL DATA:  Respiratory failure EXAM: PORTABLE CHEST 1 VIEW COMPARISON:  April 16, 2020 FINDINGS: There is a small left pleural effusion with left base atelectasis. There is no edema or consolidation. There is stable cardiomegaly with pulmonary vascularity normal. Pacemaker leads are attached the right atrium and right ventricle. Status post aortic valve replacement. There is aortic atherosclerosis. No adenopathy. No bone lesions. IMPRESSION: Small left pleural effusion with left base atelectasis. No consolidation. Stable cardiomegaly. Pacemaker leads attached to right atrium and right ventricle. Aortic Atherosclerosis (ICD10-I70.0). Electronically Signed   By: Lowella Grip III M.D.  On: 04/17/2020 06:11   DG Chest Portable 1 View  Result Date: 04/16/2020 CLINICAL DATA:  79 year old male with history of shortness of breath and weakness. EXAM: PORTABLE CHEST 1 VIEW COMPARISON:  Chest x-ray 04/15/2020. FINDINGS: Lung volumes are low. No consolidative airspace disease. No pleural effusions. No pneumothorax. No  pulmonary nodule or mass noted. Pulmonary vasculature is normal. The patient is rotated to the left on today's exam, resulting in distortion of the mediastinal contours and reduced diagnostic sensitivity and specificity for mediastinal pathology. Aortic atherosclerosis. Left-sided pacemaker device in place with leads poorly demonstrated on today's under penetrated examination. Status post TAVR. IMPRESSION: 1. Low lung volumes without radiographic evidence of acute cardiopulmonary disease. Electronically Signed   By: Vinnie Langton M.D.   On: 04/16/2020 14:33      Flora Lipps, MD  [facility] 04/18/2020

## 2020-04-18 NOTE — Progress Notes (Addendum)
NAME:  [name], MRN:  [id number], DOB:  July 27, 1941, LOS: 2 ADMISSION DATE:  04/16/2020, CONSULTATION DATE:  04/16/2020 REFERRING MD:  Dr. Roslynn Amble, ER, CHIEF COMPLAINT:  Respiratory failure  Brief History   79 yo male former smoker brought to ER with altered mental status.  Found to have acute on chronic hypoxic/hypercapnic respiratory failure.  Followed by Dr. Annamaria Boots in pulmonary office for COPD and OSA/OHS.  Has chronic back pain.  Family reports he took extra oxycodone couple days prior to admission.  History of present illness   Hx from chart and family.  Family reports he has chronic pain issues.  He had extra oxycodone at home.  He reportedly took doses on 7/23 and 7/24.  Family unsure if he took after this.  On 7/27 he was noted to be confused and non verbal.  Family brought him to ER.  He was found to have acute on chronic hypoxic/hypercapnic respiratory failure.  CT head was negative except for partially calcified meningioma.  He was advised to increase his supplemental oxygen to 4 liters.  He opted against admission at that time.   Wife reports he was back to normal status in AM of 7/28.  He went to take a nap in the afternoon of 7/28 and was wearing 4 liters oxygen.  After he fell asleep, family was not able to arouse him.  He was brought back to ER.  Found to have worsening hypercapnia.  Started on Bipap with some improvement in ABG and mental status.  Family denies any cough, sputum, wheeze, fever, chest pain.  He has chronic leg swelling.  He completed Moderna COVID vaccination in March 2021.  Past Medical History  Venous stasis dermatitis, Thrombocytopenia, RBBB, Prostate cancer, Persistent A fib on eliquis, Foot drop, Peripheral neuropathy, OA, OSA, Back pain, HTN, HLD, Shingles, Gout, Aortic stenosis s/p TAVR, Anemia, COPD  Significant Hospital Events   7/28 Admit  Consults:    Procedures:    Significant Diagnostic Tests:  CXR 7/29 > Small left pleural effusion  with left base atelectasis. No consolidation. Stable cardiomegaly. Pacemaker leads attached to right atrium and right ventricle.  Micro Data:  COVID 7/28 >> Negative  MRSA PCR 7/28 > Negative   Antimicrobials:     Interim history/subjective:  Lying in bed on RA stating he feels well. Denies any acute complaints   Objective   Blood pressure (!) 162/71, pulse 60, temperature 98.2 F (36.8 C), temperature source Oral, resp. rate 16, height 6\' 2"  (1.88 m), weight (!) 173.7 kg, SpO2 91 %.    FiO2 (%):  [21 %] 21 %   Intake/Output Summary (Last 24 hours) at 04/18/2020 1004 Last data filed at 04/18/2020 0548 Gross per 24 hour  Intake 240 ml  Output 650 ml  Net -410 ml   Filed Weights   04/16/20 2035  Weight: (!) 173.7 kg    Examination: General: Chronically ill appearing obese elderly male lying in bed in NAD HEENT: MC/AT, MM pink/moist, PERRL, sclera non-icteric  Neuro: Alert and oriented x3, non-focal  CV: s1s2 regular rate and rhythm, no murmur, rubs, or gallops,  PULM:  Diminished bilaterally, no added breath sounds, currently on RA GI: soft, bowel sounds active in all 4 quadrants, non-tender, obese but non-distended Extremities: warm/dry, 2+ pitting  edema  Skin: no rashes or lesions  Resolved Hospital Problem list   Acute metabolic encephalopathy from hypoxia, hypercapnia.  Assessment & Plan:   Acute on chronic hypoxic/hypercapnic respiratory failure Acute on  chronic diastolic CHF COPD OSA/OHS -Patient has chronic respiratory failure and on 3L [place] at home. Hypercarbic and elevated BNP on admission. Improved with Bipap , holding opiate medication, and diuresing. On review of weight for past two years I would estimate dry weight is around 152-154 kg. 173.7 kg today.  Plan: Continue supplemental oxygen for SPO2 goal 88-92% Continue Pulmicort, Brovana, and Yulperi scheduled  Daily weight, appears this has not been done yet this am Diurese as able  Strict I&O BIPAP  PRN with CPAP qHS Patient will need to follow up with primary pulmonologist at discharge   Rest of acute and chronic medical conditions now being managed per primary, these include but not limited to: Hx of chronic back pain, peripheral neuropathy. Hx of permanent a fib present on admission. St.Jude PPM dual chamber Hx of HTN, s/p TAVR, HLD. Steroid induced hyperglycemia. CKD 3a. Morbid obesity. Chronic venous stasis changes of lower legs.  Best practice:  Diet: HH/CM DVT prophylaxis: SCDs GI prophylaxis: Protonix Mobility: bed rest Code Status: full code Family Communication: per primary  Disposition: Floor   Labs   CBC: Recent Labs  Lab 04/15/20 2152 04/15/20 2236 04/16/20 1428 04/16/20 1428 04/16/20 1533 04/16/20 1816 04/17/20 0337 04/17/20 0502 04/18/20 0040  WBC 7.0  --  7.0  --   --   --  5.7  --  9.3  NEUTROABS 5.5  --  5.7  --   --   --   --   --  7.9*  HGB 11.6*   < > 12.4*   < > 12.9* 12.6* 11.9* 11.9* 12.1*  HCT 38.1*   < > 42.5   < > 38.0* 37.0* 37.6* 35.0* 38.2*  MCV 103.5*  --  107.9*  --   --   --  101.1*  --  100.8*  PLT 150  --  139*  --   --   --  127*  --  168   < > = values in this interval not displayed.    Basic Metabolic Panel: Recent Labs  Lab 04/15/20 2152 04/15/20 2236 04/16/20 1428 04/16/20 1428 04/16/20 1533 04/16/20 1816 04/17/20 0337 04/17/20 0502 04/18/20 0040  NA 139   < > 140   < > 138 139 141 140 139  K 4.5   < > 4.7   < > 4.4 4.4 4.3 4.2 4.9  CL 93*  --  95*  --   --   --  94*  --  92*  CO2 35*  --  34*  --   --   --  33*  --  36*  GLUCOSE 112*  --  124*  --   --   --  104*  --  105*  BUN 40*  --  39*  --   --   --  37*  --  44*  CREATININE 1.49*  --  1.58*  --   --   --  1.41*  --  1.40*  CALCIUM 8.9  --  9.4  --   --   --  9.5  --  8.8*  MG  --   --  2.3  --   --   --   --   --  2.3  PHOS  --   --   --   --   --   --   --   --  4.1   < > = values in this interval not displayed.   GFR: Estimated  Creatinine  Clearance: 74.3 mL/min (A) (by C-G formula based on SCr of 1.4 mg/dL (H)). Recent Labs  Lab 04/15/20 2152 04/16/20 1428 04/17/20 0337 04/18/20 0040  WBC 7.0 7.0 5.7 9.3    Liver Function Tests: Recent Labs  Lab 04/15/20 2152  AST 23  ALT 25  ALKPHOS 58  BILITOT 0.6  PROT 6.4*  ALBUMIN 3.3*   No results for input(s): LIPASE, AMYLASE in the last 168 hours. Recent Labs  Lab 04/15/20 2227  AMMONIA 20    ABG    Component Value Date/Time   PHART 7.687 (HH) 04/17/2020 0502   PCO2ART 32.7 04/17/2020 0502   PO2ART 105 04/17/2020 0502   HCO3 39.3 (H) 04/17/2020 0502   TCO2 40 (H) 04/17/2020 0502   O2SAT 99.0 04/17/2020 0502     Coagulation Profile: No results for input(s): INR, PROTIME in the last 168 hours.  Cardiac Enzymes: No results for input(s): CKTOTAL, CKMB, CKMBINDEX, TROPONINI in the last 168 hours.  HbA1C: Hgb A1c MFr Bld  Date/Time Value Ref Range Status  03/10/2020 02:01 PM 5.7 4.6 - 6.5 % Final    Comment:    Glycemic Control Guidelines for People with Diabetes:Non Diabetic:  <6%Goal of Therapy: <7%Additional Action Suggested:  >8%   11/20/2019 10:30 AM 5.6 4.6 - 6.5 % Final    Comment:    Glycemic Control Guidelines for People with Diabetes:Non Diabetic:  <6%Goal of Therapy: <7%Additional Action Suggested:  >8%     CBG: Recent Labs  Lab 04/17/20 1623 04/17/20 1933 04/17/20 2332 04/18/20 0311 04/18/20 0715  GLUCAP 164* 242* 111* 111* 103*     Signature:  [name], NP-C [facility] Contact / Pager information can be found on Amion  04/18/2020, 10:14 AM    PCCM attending:  79 year old gentleman, acute on chronic hypoxemic hypercarbic respiratory failure required BiPAP therapy briefly admitted to the intensive care unit.  Does have prior history of occasional opiate use for pain control.  Also has acute on chronic diastolic heart failure no volume overload.  BP (!) 162/71 (BP Location: Right Arm)   Pulse 60    Temp 98.2 F (36.8 C) (Oral)   Resp 16   Ht 6\' 2"  (1.88 m)   Wt (!) 173.7 kg   SpO2 92%   BMI 49.17 kg/m   General: Obese male resting in bed no distress HEENT: No obvious JVD, tracking appropriately Heart: Regular rhythm S1-S2 Lungs: Diminished in the bases, no crackles no wheeze Abdomen: Obese pannus, soft nontender nondistended Extremities: Consistent with chronic venous stasis.  Labs: Reviewed  Assessment: Acute on chronic hypoxemic hypercarbic respiratory failure Acute on chronic diastolic heart failure COPD, OSA, OHS baseline. Chronic pain  Plan: Avoid sedating medications Discussed importance of weight loss management Discussed importance of use of home NIV. Strict I's and O's Continue PAP therapy at night Continue Pulmicort, Candiss Norse for hospital stay. Discharge on PTA inhaler regimen. Can follow-up in pulmonary clinic upon discharge.  Pulmonary would not plan to see this weekend.  If needed please call or page [phone].  Garner Nash, DO [facility] 04/18/2020 2:18 PM

## 2020-04-19 DIAGNOSIS — J9621 Acute and chronic respiratory failure with hypoxia: Secondary | ICD-10-CM | POA: Diagnosis not present

## 2020-04-19 DIAGNOSIS — J449 Chronic obstructive pulmonary disease, unspecified: Secondary | ICD-10-CM | POA: Diagnosis not present

## 2020-04-19 DIAGNOSIS — J9611 Chronic respiratory failure with hypoxia: Secondary | ICD-10-CM | POA: Diagnosis not present

## 2020-04-19 DIAGNOSIS — I872 Venous insufficiency (chronic) (peripheral): Secondary | ICD-10-CM | POA: Diagnosis not present

## 2020-04-19 LAB — CBC WITH DIFFERENTIAL/PLATELET
Abs Immature Granulocytes: 0.06 10*3/uL (ref 0.00–0.07)
Basophils Absolute: 0 10*3/uL (ref 0.0–0.1)
Basophils Relative: 0 %
Eosinophils Absolute: 0.1 10*3/uL (ref 0.0–0.5)
Eosinophils Relative: 1 %
HCT: 40.1 % (ref 39.0–52.0)
Hemoglobin: 12.5 g/dL — ABNORMAL LOW (ref 13.0–17.0)
Immature Granulocytes: 1 %
Lymphocytes Relative: 14 %
Lymphs Abs: 1.3 10*3/uL (ref 0.7–4.0)
MCH: 31 pg (ref 26.0–34.0)
MCHC: 31.2 g/dL (ref 30.0–36.0)
MCV: 99.5 fL (ref 80.0–100.0)
Monocytes Absolute: 0.7 10*3/uL (ref 0.1–1.0)
Monocytes Relative: 8 %
Neutro Abs: 7.2 10*3/uL (ref 1.7–7.7)
Neutrophils Relative %: 76 %
Platelets: 164 10*3/uL (ref 150–400)
RBC: 4.03 MIL/uL — ABNORMAL LOW (ref 4.22–5.81)
RDW: 16.5 % — ABNORMAL HIGH (ref 11.5–15.5)
WBC: 9.4 10*3/uL (ref 4.0–10.5)
nRBC: 0 % (ref 0.0–0.2)

## 2020-04-19 LAB — BASIC METABOLIC PANEL
Anion gap: 9 (ref 5–15)
BUN: 39 mg/dL — ABNORMAL HIGH (ref 8–23)
CO2: 39 mmol/L — ABNORMAL HIGH (ref 22–32)
Calcium: 8.9 mg/dL (ref 8.9–10.3)
Chloride: 90 mmol/L — ABNORMAL LOW (ref 98–111)
Creatinine, Ser: 1.32 mg/dL — ABNORMAL HIGH (ref 0.61–1.24)
GFR calc Af Amer: 60 mL/min — ABNORMAL LOW (ref >60)
GFR calc non Af Amer: 52 mL/min — ABNORMAL LOW (ref >60)
Glucose, Bld: 109 mg/dL — ABNORMAL HIGH (ref 70–99)
Potassium: 3.8 mmol/L (ref 3.5–5.1)
Sodium: 138 mmol/L (ref 135–145)

## 2020-04-19 LAB — PHOSPHORUS: Phosphorus: 3.5 mg/dL (ref 2.5–4.6)

## 2020-04-19 LAB — GLUCOSE, CAPILLARY
Glucose-Capillary: 110 mg/dL — ABNORMAL HIGH (ref 70–99)
Glucose-Capillary: 110 mg/dL — ABNORMAL HIGH (ref 70–99)
Glucose-Capillary: 185 mg/dL — ABNORMAL HIGH (ref 70–99)
Glucose-Capillary: 173 mg/dL — ABNORMAL HIGH (ref 70–99)
Glucose-Capillary: 164 mg/dL — ABNORMAL HIGH (ref 70–99)
Glucose-Capillary: 154 mg/dL — ABNORMAL HIGH (ref 70–99)

## 2020-04-19 LAB — MAGNESIUM: Magnesium: 2 mg/dL (ref 1.7–2.4)

## 2020-04-19 MED ORDER — ACETAMINOPHEN 325 MG PO TABS
650.0000 mg | ORAL_TABLET | Freq: Four times a day (QID) | ORAL | Status: DC | PRN
  Administered 2020-04-19: 650 mg via ORAL
  Filled 2020-04-19: qty 2

## 2020-04-19 MED ORDER — FUROSEMIDE 40 MG PO TABS
40.0000 mg | ORAL_TABLET | Freq: Every day | ORAL | Status: DC
  Administered 2020-04-20: 40 mg via ORAL
  Filled 2020-04-19: qty 1

## 2020-04-19 NOTE — Discharge Instructions (Signed)

## 2020-04-19 NOTE — Progress Notes (Signed)
PROGRESS NOTE  [name] BMW:[id number] DOB: [birth year] DOA: 04/16/2020 PCP: [name], MD   LOS: 3 days   Brief narrative: As per HPI,  79 yo male with past medical history of Venous stasis dermatitis, Thrombocytopenia, RBBB, Prostate cancer, Persistent A fib on eliquis, Foot drop, Peripheral neuropathy, OA, OSA, Back pain, HTN, HLD, Shingles, Gout, Aortic stenosis s/p TAVR, Anemia, COPD former smoker was brought to ER with altered mental status.  Found to have acute on chronic hypoxic/hypercapnic respiratory failure.  Followed by Dr. Annamaria Boots in pulmonary office for COPD and OSA/OHS.  Has chronic back pain.  Family reported he took extra oxycodone couple days prior to admission.  Patient was then admitted to the ICU with BiPAP.  CT head scan was negative.  Assessment/Plan:  Principal Problem:   Acute on chronic respiratory failure with hypoxia and hypercapnia (HCC) Active Problems:   OBESITY, MORBID   Obstructive sleep apnea   Chronic venous insufficiency   COPD mixed type (HCC)   Essential hypertension   Persistent atrial fibrillation (HCC)   Chronic respiratory failure with hypoxia (HCC)  Acute on chronic hypoxic/hypercapnic respiratory failure secondary to COPD exacerbation. Acute on chronic diastolic CHF, OSA/OHS History of  chronic respiratory failure on 3L [place] at home.  Patient uses a trilogy at home.  Patient initially required BiPAP.  Opiates on hold.  Patient also had elevated BNP.  Continue Pulmicort, Garlon Hatchet, Yulperi scheduled.  Continue nebulizers, IV Lasix.  Has been started on CPAP at nighttime.  Has been having good diuresis.  Continue IV steroids for possible COPD flare.  Hx of chronic back pain, peripheral neuropathy. Was on opiates and Neurontin as outpatient.  Could restart Neurontin as necessary.  Hx of permanent a fib present on admission.  Status post St.Jude PPM dual chamber. Hx of HTN, s/p TAVR, HLD. Sinus rhythm at this time.  On Lasix,  Lipitor,  Eliquis.  Will change IV Lasix to p.o. Lasix at this time.  Patient is negative balance for 6 L nasal  Steroid induced hyperglycemia. Sliding scale insulin Accu-Cheks.  Latest POC glucose of 185  CKD 3a. Closely monitor BMP.  Check BMP in a.m.  Morbid obesity. Patient would benefit from weight loss.  BMI 49.  Chronic venous stasis changes of lower legs. Wound care to continue  Debility, deconditioning Physical therapy recommend home PT on discharge.  Patient is from home.  DVT prophylaxis: Place and maintain sequential compression device Start: 04/16/20 1600 apixaban (ELIQUIS) tablet 5 mg   Code Status: Full code  Family Communication: Spoke with the patient's son and the wife at bedside.  Status is: Inpatient  Remains inpatient appropriate because:Inpatient level of care appropriate due to severity of illness   Dispo: The patient is from: Home.  Patient stated he has electric wheelchair and walker at home and lives with his family.              Anticipated d/c is to: Home home health as per the physical therapy              Anticipated d/c date is: 1 day              Patient currently is not medically stable to d/c.   Consultants:  PCCM  Procedures:  BiPAP  Antibiotics:  . None  Anti-infectives (From admission, onward)   None     Subjective:  Today, patient was seen and examined at bedside.  Has less dyspnea shortness of breath.  Complains of  knee pain and requesting for braces.  Working with physical therapy.  Patient's son and wife are present.  Objective: Vitals:   04/19/20 0848 04/19/20 1045  BP: (!) 113/61   Pulse: 69   Resp: 18   Temp: 97.8 F (36.6 C)   SpO2: 90% 91%    Intake/Output Summary (Last 24 hours) at 04/19/2020 1453 Last data filed at 04/19/2020 1159 Gross per 24 hour  Intake --  Output 2400 ml  Net -2400 ml   Filed Weights   04/16/20 2035  Weight: (!) 173.7 kg   Body mass index is 49.17 kg/m.   Physical  Exam: GENERAL: Patient is alert awake and oriented. Not in obvious distress.  Morbidly obese, chronically ill HENT: Mild pallor noted, pupils equally reactive to light. Oral mucosa is moist NECK: is supple, no gross swelling noted. CHEST: Diminished breath sounds bilaterally. CVS: S1 and S2 heard, no murmur. Regular rate and rhythm.  ABDOMEN: Soft, non-tender, bowel sounds are present.  Large pannus noted EXTREMITIES: Chronic venous insufficiency noted over the bilateral lower extremity,  Bilateral lower extremity pitting edema CNS: Cranial nerves are intact. No focal motor deficits. SKIN: warm and dry ,  Data Review: I have personally reviewed the following laboratory data and studies,  CBC: Recent Labs  Lab 04/15/20 2152 04/15/20 2236 04/16/20 1428 04/16/20 1533 04/16/20 1816 04/17/20 0337 04/17/20 0502 04/18/20 0040 04/19/20 0135  WBC 7.0  --  7.0  --   --  5.7  --  9.3 9.4  NEUTROABS 5.5  --  5.7  --   --   --   --  7.9* 7.2  HGB 11.6*   < > 12.4*   < > 12.6* 11.9* 11.9* 12.1* 12.5*  HCT 38.1*   < > 42.5   < > 37.0* 37.6* 35.0* 38.2* 40.1  MCV 103.5*  --  107.9*  --   --  101.1*  --  100.8* 99.5  PLT 150  --  139*  --   --  127*  --  168 164   < > = values in this interval not displayed.   Basic Metabolic Panel: Recent Labs  Lab 04/15/20 2152 04/15/20 2236 04/16/20 1428 04/16/20 1533 04/16/20 1816 04/17/20 0337 04/17/20 0502 04/18/20 0040 04/19/20 0135  NA 139   < > 140   < > 139 141 140 139 138  K 4.5   < > 4.7   < > 4.4 4.3 4.2 4.9 3.8  CL 93*  --  95*  --   --  94*  --  92* 90*  CO2 35*  --  34*  --   --  33*  --  36* 39*  GLUCOSE 112*  --  124*  --   --  104*  --  105* 109*  BUN 40*  --  39*  --   --  37*  --  44* 39*  CREATININE 1.49*  --  1.58*  --   --  1.41*  --  1.40* 1.32*  CALCIUM 8.9  --  9.4  --   --  9.5  --  8.8* 8.9  MG  --   --  2.3  --   --   --   --  2.3 2.0  PHOS  --   --   --   --   --   --   --  4.1 3.5   < > = values in this interval  not displayed.  Liver Function Tests: Recent Labs  Lab 04/15/20 2152  AST 23  ALT 25  ALKPHOS 58  BILITOT 0.6  PROT 6.4*  ALBUMIN 3.3*   No results for input(s): LIPASE, AMYLASE in the last 168 hours. Recent Labs  Lab 04/15/20 2227  AMMONIA 20   Cardiac Enzymes: No results for input(s): CKTOTAL, CKMB, CKMBINDEX, TROPONINI in the last 168 hours. BNP (last 3 results) Recent Labs    04/15/20 2152 04/16/20 1428  BNP 737.0* 557.9*    ProBNP (last 3 results) No results for input(s): PROBNP in the last 8760 hours.  CBG: Recent Labs  Lab 04/18/20 2036 04/18/20 2312 04/19/20 0344 04/19/20 0742 04/19/20 1107  GLUCAP 110* 117* 110* 110* 185*   Recent Results (from the past 240 hour(s))  SARS Coronavirus 2 by RT PCR (hospital order, performed in [facility] hospital lab) Nasopharyngeal Nasopharyngeal Swab     Status: None   Collection Time: 04/16/20  2:28 PM   Specimen: Nasopharyngeal Swab  Result Value Ref Range Status   SARS Coronavirus 2 NEGATIVE NEGATIVE Final    Comment: (NOTE) SARS-CoV-2 target nucleic acids are NOT DETECTED.  The SARS-CoV-2 RNA is generally detectable in upper and lower respiratory specimens during the acute phase of infection. The lowest concentration of SARS-CoV-2 viral copies this assay can detect is 250 copies / mL. A negative result does not preclude SARS-CoV-2 infection and should not be used as the sole basis for treatment or other patient management decisions.  A negative result may occur with improper specimen collection / handling, submission of specimen other than nasopharyngeal swab, presence of viral mutation(s) within the areas targeted by this assay, and inadequate number of viral copies (<250 copies / mL). A negative result must be combined with clinical observations, patient history, and epidemiological information.  Fact Sheet for Patients:   StrictlyIdeas.no  Fact Sheet for Healthcare  Providers: BankingDealers.co.za  This test is not yet approved or  cleared by the [place] FDA and has been authorized for detection and/or diagnosis of SARS-CoV-2 by FDA under an Emergency Use Authorization (EUA).  This EUA will remain in effect (meaning this test can be used) for the duration of the COVID-19 declaration under Section 564(b)(1) of the Act, 21 U.S.C. section 360bbb-3(b)(1), unless the authorization is terminated or revoked sooner.  Performed at [facility], [place] [address], [place], [place] [address]   MRSA PCR Screening     Status: None   Collection Time: 04/16/20  8:40 PM   Specimen: Nasal Mucosa; Nasopharyngeal  Result Value Ref Range Status   MRSA by PCR NEGATIVE NEGATIVE Final    Comment:        The GeneXpert MRSA Assay (FDA approved for NASAL specimens only), is one component of a comprehensive MRSA colonization surveillance program. It is not intended to diagnose MRSA infection nor to guide or monitor treatment for MRSA infections. Performed at [facility], [place] [address], [place], [place] [address]      Studies: No results found.    Flora Lipps, MD  [facility] 04/19/2020

## 2020-04-19 NOTE — Evaluation (Signed)
Physical Therapy Evaluation Patient Details Name: [name] MRN: [id number] DOB: 17-Nov-1940 Today's Date: 04/19/2020   History of Present Illness  79 yo male former smoker brought to ER with altered mental status.  Found to have acute on chronic hypoxic/hypercapnic respiratory failure.  Followed by Dr. Annamaria Boots in pulmonary office for COPD and OSA/OHS.  Has chronic back pain.  Clinical Impression  Patient received in bed, wife and son present for session. He is agreeable to PT assessment. He reports chronic back pain. Patient requires mod assist for supine ><sit. Difficulty raising trunk up to seated position. He is able to stand with mod +2 assist from elevated surface. Increased time and effort needed to get standing balance. He is able to ambulate 10 feet with RW and bilateral platforms for upper body support. Slow, labored gait requiring increased time and effort.  He will continue to benefit from skilled PT while here to improve functional mobility and safety.     Follow Up Recommendations Home health PT;Supervision for mobility/OOB    Equipment Recommendations  Other (comment) (patient needs new knee brace. The one he has is very worn and old)    Recommendations for Other Services       Precautions / Restrictions Precautions Precaution Comments: mod fall Restrictions Weight Bearing Restrictions: No      Mobility  Bed Mobility Overal bed mobility: Needs Assistance Bed Mobility: Supine to Sit;Sit to Supine     Supine to sit: Mod assist;HOB elevated Sit to supine: Mod assist   General bed mobility comments: Mod assist and multiple attempts to get fully up to seated position. Mod assist to bring LEs back up onto bed.  Transfers Overall transfer level: Needs assistance Equipment used: Rolling walker (2 wheeled) Transfers: Sit to/from Stand Sit to Stand: Mod assist;+2 physical assistance;From elevated surface         General transfer comment: bed at elevated height,  shoes donned, son assisted with transfer. increased time and effort needed to get steady in static standing.  Ambulation/Gait Ambulation/Gait assistance: Min assist;+2 safety/equipment Gait Distance (Feet): 10 Feet Assistive device: Rolling walker (2 wheeled);Right platform walker;Left platform walker Gait Pattern/deviations: Step-to pattern;Wide base of support;Decreased stride length;Shuffle;Trunk flexed Gait velocity: decreased   General Gait Details: patient able to ambulate 5 feet then turned around back to bed for another 5 feet. He requires increased time and effort. +2 for safety and chair nearby.  Stairs            Wheelchair Mobility    Modified Rankin (Stroke Patients Only)       Balance Overall balance assessment: Needs assistance Sitting-balance support: Feet supported Sitting balance-Leahy Scale: Good     Standing balance support: Bilateral upper extremity supported;During functional activity Standing balance-Leahy Scale: Fair Standing balance comment: heavy UE support on RW. External assist for safety                             Pertinent Vitals/Pain Pain Assessment: Faces Faces Pain Scale: Hurts little more Pain Location: back, chronic Pain Descriptors / Indicators: Discomfort;Sore Pain Intervention(s): Monitored during session;Repositioned    Home Living Family/patient expects to be discharged to:: Private residence Living Arrangements: Spouse/significant other Available Help at Discharge: Family;Available 24 hours/day Type of Home: House Home Access: Ramped entrance     Home Layout: One level Home Equipment: Walker - 2 wheels;Electric scooter Additional Comments: Patient has all equipment needed at home.    Prior Function Level  of Independence: Needs assistance   Gait / Transfers Assistance Needed: uses rw at home indoors, electric scooter for out in community. Has handicapped Lucianne Lei. Drives.  ADL's / Homemaking Assistance Needed:  wife assists, children        Hand Dominance   Dominant Hand: Right    Extremity/Trunk Assessment   Upper Extremity Assessment Upper Extremity Assessment: Generalized weakness    Lower Extremity Assessment Lower Extremity Assessment: Generalized weakness    Cervical / Trunk Assessment Cervical / Trunk Assessment: Normal  Communication   Communication: No difficulties  Cognition Arousal/Alertness: Awake/alert Behavior During Therapy: WFL for tasks assessed/performed Overall Cognitive Status: Within Functional Limits for tasks assessed                                 General Comments: pleasant, cooperative      General Comments      Exercises     Assessment/Plan    PT Assessment Patient needs continued PT services  PT Problem List Decreased strength;Decreased mobility;Decreased activity tolerance;Decreased balance;Cardiopulmonary status limiting activity;Pain;Obesity       PT Treatment Interventions Therapeutic activities;Gait training;Therapeutic exercise;Patient/family education;Functional mobility training    PT Goals (Current goals can be found in the Care Plan section)  Acute Rehab PT Goals Patient Stated Goal: to return home, improve strength and mobility PT Goal Formulation: With patient/family Time For Goal Achievement: 04/26/20 Potential to Achieve Goals: Good    Frequency Min 3X/week   Barriers to discharge        Co-evaluation               AM-PAC PT "6 Clicks" Mobility  Outcome Measure Help needed turning from your back to your side while in a flat bed without using bedrails?: A Lot Help needed moving from lying on your back to sitting on the side of a flat bed without using bedrails?: A Lot Help needed moving to and from a bed to a chair (including a wheelchair)?: A Lot Help needed standing up from a chair using your arms (e.g., wheelchair or bedside chair)?: A Lot Help needed to walk in hospital room?: A Lot Help  needed climbing 3-5 steps with a railing? : Total 6 Click Score: 11    End of Session Equipment Utilized During Treatment: Gait belt;Other (comment) (left knee brace donned) Activity Tolerance: Patient limited by fatigue Patient left: in bed;with call bell/phone within reach;with family/visitor present Nurse Communication: Mobility status PT Visit Diagnosis: Unsteadiness on feet (R26.81);Other abnormalities of gait and mobility (R26.89);Muscle weakness (generalized) (M62.81);Difficulty in walking, not elsewhere classified (R26.2);Pain Pain - part of body:  (back)    Time: 9201-0071 PT Time Calculation (min) (ACUTE ONLY): 28 min   Charges:   PT Evaluation $PT Eval Moderate Complexity: 1 Mod PT Treatments $Gait Training: 8-22 mins        Nashya Garlington, PT, GCS 04/19/20,11:00 AM

## 2020-04-20 DIAGNOSIS — J9621 Acute and chronic respiratory failure with hypoxia: Secondary | ICD-10-CM | POA: Diagnosis not present

## 2020-04-20 DIAGNOSIS — I872 Venous insufficiency (chronic) (peripheral): Secondary | ICD-10-CM | POA: Diagnosis not present

## 2020-04-20 DIAGNOSIS — J9611 Chronic respiratory failure with hypoxia: Secondary | ICD-10-CM | POA: Diagnosis not present

## 2020-04-20 DIAGNOSIS — J449 Chronic obstructive pulmonary disease, unspecified: Secondary | ICD-10-CM | POA: Diagnosis not present

## 2020-04-20 LAB — BASIC METABOLIC PANEL
Anion gap: 10 (ref 5–15)
BUN: 36 mg/dL — ABNORMAL HIGH (ref 8–23)
CO2: 35 mmol/L — ABNORMAL HIGH (ref 22–32)
Calcium: 8.7 mg/dL — ABNORMAL LOW (ref 8.9–10.3)
Chloride: 92 mmol/L — ABNORMAL LOW (ref 98–111)
Creatinine, Ser: 1.23 mg/dL (ref 0.61–1.24)
GFR calc Af Amer: >60 mL/min (ref >60)
GFR calc non Af Amer: 56 mL/min — ABNORMAL LOW (ref >60)
Glucose, Bld: 124 mg/dL — ABNORMAL HIGH (ref 70–99)
Potassium: 3.8 mmol/L (ref 3.5–5.1)
Sodium: 137 mmol/L (ref 135–145)

## 2020-04-20 LAB — PHOSPHORUS: Phosphorus: 3.4 mg/dL (ref 2.5–4.6)

## 2020-04-20 LAB — CBC WITH DIFFERENTIAL/PLATELET
Abs Immature Granulocytes: 0.07 10*3/uL (ref 0.00–0.07)
Basophils Absolute: 0 10*3/uL (ref 0.0–0.1)
Basophils Relative: 0 %
Eosinophils Absolute: 0 10*3/uL (ref 0.0–0.5)
Eosinophils Relative: 0 %
HCT: 40.9 % (ref 39.0–52.0)
Hemoglobin: 13.2 g/dL (ref 13.0–17.0)
Immature Granulocytes: 1 %
Lymphocytes Relative: 11 %
Lymphs Abs: 1.1 10*3/uL (ref 0.7–4.0)
MCH: 31.6 pg (ref 26.0–34.0)
MCHC: 32.3 g/dL (ref 30.0–36.0)
MCV: 97.8 fL (ref 80.0–100.0)
Monocytes Absolute: 0.5 10*3/uL (ref 0.1–1.0)
Monocytes Relative: 5 %
Neutro Abs: 7.8 10*3/uL — ABNORMAL HIGH (ref 1.7–7.7)
Neutrophils Relative %: 83 %
Platelets: 162 10*3/uL (ref 150–400)
RBC: 4.18 MIL/uL — ABNORMAL LOW (ref 4.22–5.81)
RDW: 16.2 % — ABNORMAL HIGH (ref 11.5–15.5)
WBC: 9.3 10*3/uL (ref 4.0–10.5)
nRBC: 0 % (ref 0.0–0.2)

## 2020-04-20 LAB — MAGNESIUM: Magnesium: 2.1 mg/dL (ref 1.7–2.4)

## 2020-04-20 LAB — GLUCOSE, CAPILLARY
Glucose-Capillary: 121 mg/dL — ABNORMAL HIGH (ref 70–99)
Glucose-Capillary: 185 mg/dL — ABNORMAL HIGH (ref 70–99)
Glucose-Capillary: 136 mg/dL — ABNORMAL HIGH (ref 70–99)

## 2020-04-20 MED ORDER — PREDNISONE 10 MG PO TABS
40.0000 mg | ORAL_TABLET | Freq: Every day | ORAL | 0 refills | Status: AC
Start: 2020-04-20 — End: 2020-04-23

## 2020-04-20 MED ORDER — SPIRIVA HANDIHALER 18 MCG IN CAPS
18.0000 ug | ORAL_CAPSULE | Freq: Every day | RESPIRATORY_TRACT | 2 refills | Status: DC
Start: 2020-04-20 — End: 2020-05-27

## 2020-04-20 MED ORDER — FLUTICASONE PROPIONATE HFA 110 MCG/ACT IN AERO
2.0000 | INHALATION_SPRAY | Freq: Two times a day (BID) | RESPIRATORY_TRACT | 0 refills | Status: DC
Start: 2020-04-20 — End: 2020-09-08

## 2020-04-20 NOTE — Discharge Summary (Signed)
Physician Discharge Summary  [name] ION:[id number] DOB: 01/26/1941 DOA: 04/16/2020  PCP: [name], MD  Admit date: 04/16/2020 Discharge date: 04/20/2020  Admitted From: Home  Discharge disposition: Home with home health   Recommendations for Outpatient Follow-Up:   . Follow up with your primary care provider in one week.  . Check CBC, BMP, magnesium in the next visit. . Follow-up with your pulmonary doctor Dr. Annamaria Boots in 2 to 3 weeks.   Discharge Diagnosis:   Principal Problem:   Acute on chronic respiratory failure with hypoxia and hypercapnia (HCC) Active Problems:   OBESITY, MORBID   Obstructive sleep apnea   Chronic venous insufficiency   COPD mixed type (HCC)   Essential hypertension   Persistent atrial fibrillation (HCC)   Chronic respiratory failure with hypoxia (HCC)    Discharge Condition: Improved.  Diet recommendation: Low sodium, heart healthy.  .  Wound care: None.  Code status: Full.   History of Present Illness:  79 yo male with past medical history of Venous stasis dermatitis, Thrombocytopenia, RBBB, Prostate cancer, Persistent A fib on eliquis, Foot drop, Peripheral neuropathy, OA, OSA, Back pain, HTN, HLD, Shingles, Gout, Aortic stenosis s/p TAVR, Anemia, COPD former smoker was brought to ER with altered mental status. Found to have acute on chronic hypoxic/hypercapnic respiratory failure. Followed by Dr. Annamaria Boots in pulmonary office for COPD and OSA/OHS. Has chronic back pain. Family reported he took extra oxycodone couple days prior to admission.  Patient was then admitted to the ICU with BiPAP..  CT head scan was negative.  Patient improved and subsequently was transferred out of the ICU  Hospital Course:   Following conditions were addressed during hospitalization as listed below,  Acute on chronic hypoxic/hypercapnic respiratory failure secondary to COPD exacerbation. Acute on chronic diastolic CHF, OSA/OHS History of  chronic  respiratory failure on 3L [place] at home.  Patient uses  trilogy at home.  Patient initially required BiPAP.   Patient also had elevated BNP.  Patient was on Pulmicort, Brovana, Yulperi scheduled during hospitalization.   We will add Advair and Flovent on discharge.   Received IV Lasix during hospitalization which will be continued orally on discharge.  Will be given short course of prednisone on discharge as well.  Hx of chronic back pain, peripheral neuropathy. Was on opiates and Neurontin as outpatient.    Limiting narcotics was discussed with the patient.  Could restart Neurontin.  Hx of permanent a fib present on admission.  Status post St.Jude PPM dual chamber. Hx of HTN, s/p TAVR, HLD. Sinus rhythm at this time.   Continue Lasix,  Lipitor, Eliquis on discharge  Steroid induced hyperglycemia. Received sliding scale insulin during hospitalization.  CKD 3a. BMP as outpatient.  Morbid obesity. Patient would benefit from weight loss.  BMI 49.  Chronic venous stasis changes of lower legs. Wound care to continue  Debility, deconditioning Physical therapy recommend home PT on discharge.  Patient is from home.  Disposition.  At this time, patient is stable for disposition home. Spoke with the patient's wife and son at bedside.  Medical Consultants:    Pulmonary  Procedures:    BiPAP Subjective:   Today, patient feels better. Denies dyspnea, increasing shortness of breath. Patient wishing to go home  Discharge Exam:   Vitals:   04/19/20 2247 04/20/20 0758  BP:  (!) 158/59  Pulse:  73  Resp:  14  Temp:  97.8 F (36.6 C)  SpO2: 94% 100%   Vitals:  04/19/20 2114 04/19/20 2247 04/20/20 0500 04/20/20 0758  BP: (!) 143/72   (!) 158/59  Pulse: 59   73  Resp: 14   14  Temp: 98.6 F (37 C)   97.8 F (36.6 C)  TempSrc: Oral     SpO2: 91% 94%  100%  Weight:   (!) 169.1 kg   Height:        General: Alert awake, not in obvious distress, morbidly obese HENT: pupils  equally reacting to light, mild pallor noted. Oral mucosa is moist.  Chest:  .  Diminished breath sounds bilaterally. No crackles or wheezes.  CVS: S1 &S2 heard. No murmur.  Regular rate and rhythm. Abdomen: Soft, nontender, nondistended.  Bowel sounds are heard.   Extremities: No cyanosis, clubbing mild pedal edema. peripheral pulses are palpable. Psych: Alert, awake and oriented, normal mood CNS:  No cranial nerve deficits.  Power equal in all extremities.   Skin: Warm and dry. Moderate venous insufficiency with bilateral lower extremity.  The results of significant diagnostics from this hospitalization (including imaging, microbiology, ancillary and laboratory) are listed below for reference.     Diagnostic Studies:   DG Chest Port 1 View  Result Date: 04/17/2020 CLINICAL DATA:  Respiratory failure EXAM: PORTABLE CHEST 1 VIEW COMPARISON:  April 16, 2020 FINDINGS: There is a small left pleural effusion with left base atelectasis. There is no edema or consolidation. There is stable cardiomegaly with pulmonary vascularity normal. Pacemaker leads are attached the right atrium and right ventricle. Status post aortic valve replacement. There is aortic atherosclerosis. No adenopathy. No bone lesions. IMPRESSION: Small left pleural effusion with left base atelectasis. No consolidation. Stable cardiomegaly. Pacemaker leads attached to right atrium and right ventricle. Aortic Atherosclerosis (ICD10-I70.0). Electronically Signed   By: Lowella Grip III M.D.   On: 04/17/2020 06:11   DG Chest Portable 1 View  Result Date: 04/16/2020 CLINICAL DATA:  79 year old male with history of shortness of breath and weakness. EXAM: PORTABLE CHEST 1 VIEW COMPARISON:  Chest x-ray 04/15/2020. FINDINGS: Lung volumes are low. No consolidative airspace disease. No pleural effusions. No pneumothorax. No pulmonary nodule or mass noted. Pulmonary vasculature is normal. The patient is rotated to the left on today's exam,  resulting in distortion of the mediastinal contours and reduced diagnostic sensitivity and specificity for mediastinal pathology. Aortic atherosclerosis. Left-sided pacemaker device in place with leads poorly demonstrated on today's under penetrated examination. Status post TAVR. IMPRESSION: 1. Low lung volumes without radiographic evidence of acute cardiopulmonary disease. Electronically Signed   By: Vinnie Langton M.D.   On: 04/16/2020 14:33     Labs:   Basic Metabolic Panel: Recent Labs  Lab 04/16/20 1428 04/16/20 1533 04/17/20 0337 04/17/20 0337 04/17/20 0502 04/17/20 0502 04/18/20 0040 04/18/20 0040 04/19/20 0135 04/20/20 0152  NA 140   < > 141  --  140  --  139  --  138 137  K 4.7   < > 4.3   < > 4.2   < > 4.9   < > 3.8 3.8  CL 95*  --  94*  --   --   --  92*  --  90* 92*  CO2 34*  --  33*  --   --   --  36*  --  39* 35*  GLUCOSE 124*  --  104*  --   --   --  105*  --  109* 124*  BUN 39*  --  37*  --   --   --  44*  --  39* 36*  CREATININE 1.58*  --  1.41*  --   --   --  1.40*  --  1.32* 1.23  CALCIUM 9.4  --  9.5  --   --   --  8.8*  --  8.9 8.7*  MG 2.3  --   --   --   --   --  2.3  --  2.0 2.1  PHOS  --   --   --   --   --   --  4.1  --  3.5 3.4   < > = values in this interval not displayed.   GFR Estimated Creatinine Clearance: 83.2 mL/min (by C-G formula based on SCr of 1.23 mg/dL). Liver Function Tests: Recent Labs  Lab 04/15/20 2152  AST 23  ALT 25  ALKPHOS 58  BILITOT 0.6  PROT 6.4*  ALBUMIN 3.3*   No results for input(s): LIPASE, AMYLASE in the last 168 hours. Recent Labs  Lab 04/15/20 2227  AMMONIA 20   Coagulation profile No results for input(s): INR, PROTIME in the last 168 hours.  CBC: Recent Labs  Lab 04/15/20 2152 04/15/20 2236 04/16/20 1428 04/16/20 1533 04/17/20 0337 04/17/20 0502 04/18/20 0040 04/19/20 0135 04/20/20 0152  WBC 7.0   < > 7.0  --  5.7  --  9.3 9.4 9.3  NEUTROABS 5.5  --  5.7  --   --   --  7.9* 7.2 7.8*  HGB  11.6*   < > 12.4*   < > 11.9* 11.9* 12.1* 12.5* 13.2  HCT 38.1*   < > 42.5   < > 37.6* 35.0* 38.2* 40.1 40.9  MCV 103.5*   < > 107.9*  --  101.1*  --  100.8* 99.5 97.8  PLT 150   < > 139*  --  127*  --  168 164 162   < > = values in this interval not displayed.   Cardiac Enzymes: No results for input(s): CKTOTAL, CKMB, CKMBINDEX, TROPONINI in the last 168 hours. BNP: Invalid input(s): POCBNP CBG: Recent Labs  Lab 04/19/20 1613 04/19/20 2100 04/19/20 2319 04/20/20 0302 04/20/20 0759  GLUCAP 173* 164* 154* 121* 185*   D-Dimer No results for input(s): DDIMER in the last 72 hours. Hgb A1c No results for input(s): HGBA1C in the last 72 hours. Lipid Profile No results for input(s): CHOL, HDL, LDLCALC, TRIG, CHOLHDL, LDLDIRECT in the last 72 hours. Thyroid function studies No results for input(s): TSH, T4TOTAL, T3FREE, THYROIDAB in the last 72 hours.  Invalid input(s): FREET3 Anemia work up No results for input(s): VITAMINB12, FOLATE, FERRITIN, TIBC, IRON, RETICCTPCT in the last 72 hours. Microbiology Recent Results (from the past 240 hour(s))  SARS Coronavirus 2 by RT PCR (hospital order, performed in [facility] hospital lab) Nasopharyngeal Nasopharyngeal Swab     Status: None   Collection Time: 04/16/20  2:28 PM   Specimen: Nasopharyngeal Swab  Result Value Ref Range Status   SARS Coronavirus 2 NEGATIVE NEGATIVE Final    Comment: (NOTE) SARS-CoV-2 target nucleic acids are NOT DETECTED.  The SARS-CoV-2 RNA is generally detectable in upper and lower respiratory specimens during the acute phase of infection. The lowest concentration of SARS-CoV-2 viral copies this assay can detect is 250 copies / mL. A negative result does not preclude SARS-CoV-2 infection and should not be used as the sole basis for treatment or other patient management decisions.  A negative result may occur with improper specimen collection / handling,  submission of specimen other than nasopharyngeal  swab, presence of viral mutation(s) within the areas targeted by this assay, and inadequate number of viral copies (<250 copies / mL). A negative result must be combined with clinical observations, patient history, and epidemiological information.  Fact Sheet for Patients:   StrictlyIdeas.no  Fact Sheet for Healthcare Providers: BankingDealers.co.za  This test is not yet approved or  cleared by the [place] FDA and has been authorized for detection and/or diagnosis of SARS-CoV-2 by FDA under an Emergency Use Authorization (EUA).  This EUA will remain in effect (meaning this test can be used) for the duration of the COVID-19 declaration under Section 564(b)(1) of the Act, 21 U.S.C. section 360bbb-3(b)(1), unless the authorization is terminated or revoked sooner.  Performed at [facility], [place] [address], [place], [place] [address]   MRSA PCR Screening     Status: None   Collection Time: 04/16/20  8:40 PM   Specimen: Nasal Mucosa; Nasopharyngeal  Result Value Ref Range Status   MRSA by PCR NEGATIVE NEGATIVE Final    Comment:        The GeneXpert MRSA Assay (FDA approved for NASAL specimens only), is one component of a comprehensive MRSA colonization surveillance program. It is not intended to diagnose MRSA infection nor to guide or monitor treatment for MRSA infections. Performed at [facility], [place] [address], [place], [place] [address]      Discharge Instructions:   Discharge Instructions    Call MD for:   Complete by: As directed    Worsening symptoms including difficulty breathing   Diet - low sodium heart healthy   Complete by: As directed    Discharge instructions   Complete by: As directed    Follow-up with your primary care physician in 1 week. Follow-up with Dr. Annamaria Boots your pulmonologist in 2 to 3 weeks. Continue using oxygen at 3L/min at night. Use trilogy during sleep and at nighttime.  Continue to take inhalers as prescribed.   Increase activity slowly   Complete by: As directed    No wound care   Complete by: As directed      Allergies as of 04/20/2020      Reactions   Coumadin [warfarin Sodium] Other (See Comments)   States he can't be on this-bleeds out      Medication List    TAKE these medications   acetaminophen 500 MG tablet Commonly known as: TYLENOL Take 1,000 mg by mouth 2 (two) times daily.   albuterol 108 (90 Base) MCG/ACT inhaler Commonly known as: VENTOLIN HFA Inhale 1-2 puffs into the lungs every 6 (six) hours as needed for wheezing or shortness of breath.   allopurinol 100 MG tablet Commonly known as: ZYLOPRIM TAKE 1 TABLET BY MOUTH EVERY DAY   atorvastatin 40 MG tablet Commonly known as: LIPITOR Take 1 and a half tablet daily by mouth  (60mg ) What changed:   how much to take  how to take this  when to take this  additional instructions   AZO-STANDARD PO Take by mouth.   CENTRUM SILVER PO Take 1 tablet by mouth daily.   CoQ10 100 MG Caps Take 300 mg by mouth daily.   CRANBERRY PO Take 1 capsule by mouth 2 (two) times a day.   Eliquis 5 MG Tabs tablet Generic drug: apixaban TAKE 1 TABLET BY MOUTH TWICE A DAY What changed: how much to take   fluticasone 110 MCG/ACT inhaler Commonly known as: FLOVENT HFA Inhale  2 puffs into the lungs 2 (two) times daily.   furosemide 20 MG tablet Commonly known as: LASIX TAKE 1 TABLET BY MOUTH TWICE A DAY What changed: when to take this   gabapentin 300 MG capsule Commonly known as: NEURONTIN 1 cap po bid and 2 caps po qhs What changed:   how much to take  how to take this  when to take this  additional instructions   Hematinic/Folic Acid 001-7 MG Tabs Generic drug: Ferrous Fumarate-Folic Acid TAKE 1 TABLET BY MOUTH EVERY DAY   levocetirizine 5 MG tablet Commonly known as: XYZAL Take 1 tablet (5 mg total) by mouth every evening.   montelukast 10 MG tablet Commonly  known as: SINGULAIR Take 1 tablet (10 mg total) by mouth at bedtime.   polyethylene glycol 17 g packet Commonly known as: MIRALAX / GLYCOLAX Take 17 g by mouth daily.   predniSONE 10 MG tablet Commonly known as: DELTASONE Take 4 tablets (40 mg total) by mouth daily for 3 days.   sennosides-docusate sodium 8.6-50 MG tablet Commonly known as: SENOKOT-S Take 1 tablet by mouth daily.   Spiriva HandiHaler 18 MCG inhalation capsule Generic drug: tiotropium Place 1 capsule (18 mcg total) into inhaler and inhale daily.   TART CHERRY ADVANCED PO Take 1,200 mg by mouth 2 (two) times a day.   vitamin C 500 MG tablet Commonly known as: ASCORBIC ACID Take 500 mg by mouth 2 (two) times daily.   Vitamin D3 125 MCG (5000 UT) Caps Take 5,000 Units by mouth daily.         Time coordinating discharge: 39 minutes  Signed:  Joniya Boberg  [facility] 04/20/2020, 11:47 AM

## 2020-04-20 NOTE — TOC Transition Note (Signed)
Transition of Care [facility]) - CM/SW Discharge Note   Patient Details  Name: [name] MRN: [id number] Date of Birth: 21-Jul-1941  Transition of Care [facility]) CM/SW Contact:  Carles Collet, RN Phone Number: 04/20/2020, 1:08 PM   Clinical Narrative:    Damaris Schooner w patient agreeable to [facility]. Referral accepted by Alvis Lemmings No DME needs No other CM needs    Final next level of care: [place] Barriers to Discharge: No Barriers Identified   Patient Goals and CMS Choice Patient states their goals for this hospitalization and ongoing recovery are:: to go home CMS Medicare.gov Compare Post Acute Care list provided to:: Patient Choice offered to / list presented to : Patient  Discharge Placement                       Discharge Plan and Services                          HH Arranged: PT [facility] Agency: [place] Date [place]: 04/20/20 Time [place]: 2952 Representative spoke with at [place]: [place] ([place]) Interventions     Readmission Risk Interventions No flowsheet data found.

## 2020-04-21 ENCOUNTER — Telehealth: Payer: Self-pay | Admitting: *Deleted

## 2020-04-21 ENCOUNTER — Telehealth: Payer: Self-pay | Admitting: Medical

## 2020-04-21 DIAGNOSIS — J9622 Acute and chronic respiratory failure with hypercapnia: Secondary | ICD-10-CM | POA: Diagnosis not present

## 2020-04-21 DIAGNOSIS — G4733 Obstructive sleep apnea (adult) (pediatric): Secondary | ICD-10-CM | POA: Diagnosis not present

## 2020-04-21 DIAGNOSIS — J449 Chronic obstructive pulmonary disease, unspecified: Secondary | ICD-10-CM | POA: Diagnosis not present

## 2020-04-21 DIAGNOSIS — R062 Wheezing: Secondary | ICD-10-CM | POA: Diagnosis not present

## 2020-04-21 NOTE — Telephone Encounter (Signed)
Transition Care Management Follow-up Telephone Call   Date discharged? 04/20/20   How have you been since you were released from the hospital? I am still weak.    Do you understand why you were in the hospital? yes   Do you understand the discharge instructions? yes   Where were you discharged to? Home with wife and son.   Items Reviewed:  Medications reviewed: "They added prednisone for 3 days and an inhaler". Doesn't have list on hand.   Allergies reviewed: yes  Dietary changes reviewed: yes  Referrals reviewed: yes   Functional Questionnaire:   Activities of Daily Living (ADLs):   He states they are independent in the following: ambulation, bathing and hygiene, feeding, continence, grooming, toileting and dressing States they require assistance with the following: n/a   Any transportation issues/concerns?: no   Any patient concerns? no   Confirmed importance and date/time of follow-up visits scheduled yes  Provider Appointment booked with Percell Miller PA 04/25/20  Confirmed with patient if condition begins to worsen call PCP or go to the ER.  Patient was given the office number and encouraged to call back with question or concerns.  : yes

## 2020-04-21 NOTE — Telephone Encounter (Signed)
Make sure he has 40 minute follow up with me post hospital.

## 2020-04-25 ENCOUNTER — Other Ambulatory Visit: Payer: Self-pay

## 2020-04-25 ENCOUNTER — Encounter: Payer: Self-pay | Admitting: Medical

## 2020-04-25 ENCOUNTER — Telehealth: Payer: Self-pay | Admitting: Medical

## 2020-04-25 ENCOUNTER — Ambulatory Visit (INDEPENDENT_AMBULATORY_CARE_PROVIDER_SITE_OTHER): Payer: Medicare HMO | Admitting: Medical

## 2020-04-25 VITALS — BP 146/98 | HR 67 | Resp 18 | Ht 74.0 in | Wt 370.0 lb

## 2020-04-25 DIAGNOSIS — J9622 Acute and chronic respiratory failure with hypercapnia: Secondary | ICD-10-CM | POA: Diagnosis not present

## 2020-04-25 DIAGNOSIS — J209 Acute bronchitis, unspecified: Secondary | ICD-10-CM | POA: Diagnosis not present

## 2020-04-25 DIAGNOSIS — D649 Anemia, unspecified: Secondary | ICD-10-CM

## 2020-04-25 DIAGNOSIS — S90424A Blister (nonthermal), right lesser toe(s), initial encounter: Secondary | ICD-10-CM

## 2020-04-25 DIAGNOSIS — J440 Chronic obstructive pulmonary disease with acute lower respiratory infection: Secondary | ICD-10-CM | POA: Diagnosis not present

## 2020-04-25 DIAGNOSIS — Z7409 Other reduced mobility: Secondary | ICD-10-CM

## 2020-04-25 DIAGNOSIS — J9621 Acute and chronic respiratory failure with hypoxia: Secondary | ICD-10-CM | POA: Diagnosis not present

## 2020-04-25 DIAGNOSIS — N185 Chronic kidney disease, stage 5: Secondary | ICD-10-CM | POA: Diagnosis not present

## 2020-04-25 DIAGNOSIS — I120 Hypertensive chronic kidney disease with stage 5 chronic kidney disease or end stage renal disease: Secondary | ICD-10-CM

## 2020-04-25 DIAGNOSIS — R790 Abnormal level of blood mineral: Secondary | ICD-10-CM | POA: Diagnosis not present

## 2020-04-25 NOTE — Progress Notes (Signed)
Subjective:    Patient ID: [name], male    DOB: 02/23/1941, 79 y.o.   MRN: [id number]  HPI  Pt in for follow up.  Pt was recently hospitalized. Admit date 04/16/20 and DC date 04/20/20.   Principal Problem:    Acute on chronic respiratory failure with hypoxia and hypercapnia (HCC) Active Problems:   OBESITY, MORBID   Obstructive sleep apnea   Chronic venous insufficiency   COPD mixed type (HCC)   Essential hypertension   Persistent atrial fibrillation (HCC)   Chronic respiratory failure with hypoxia ([place])  HPI-79 yo malewith past medical history ofVenous stasis dermatitis, Thrombocytopenia, RBBB, Prostate cancer, Persistent A fib on eliquis, Foot drop, Peripheral neuropathy, OA, OSA, Back pain, HTN, HLD, Shingles, Gout, Aortic stenosis s/p TAVR, Anemia, COPD former smokerwasbrought to ER with altered mental status. Found to have acute on chronic hypoxic/hypercapnic respiratory failure. Followed by Dr. Annamaria Boots in pulmonary office for COPD and OSA/OHS. Has chronic back pain. Family reportedhe took extra oxycodone couple days prior to admission. Patient was then admitted to the ICU with BiPAP..CT head scan was negative.  Patient improved and subsequently was transferred out of the ICU  Hospital course.   Following conditions were addressed during hospitalization as listed below,  Acute on chronic hypoxic/hypercapnic respiratory failuresecondary to COPD exacerbation.Acute on chronic diastolic CHF,OSA/OHS History ofchronic respiratory failure on 3L [place] at home. Patient uses  trilogy at home. Patient initially required BiPAP. Patient also had elevated BNP. Patient was on Pulmicort, Brovana, Yulperi scheduled during hospitalization.  We will add Advair and Flovent on discharge.  Received IV Lasix during hospitalization which will be continued orally on discharge.  Will be given short course of prednisone on discharge as well.  Hx of chronic back pain, peripheral  neuropathy. Was on opiates and Neurontin as outpatient.   Limiting narcotics was discussed with the patient.  Could restart Neurontin.  Hx of permanent a fib present on admission.Status post St.Jude PPM dual chamber.Hx of HTN, s/p TAVR, HLD. Sinus rhythm at this time.  Continue Lasix,Lipitor,Eliquis on discharge  Steroid induced hyperglycemia. Received sliding scale insulin during hospitalization.  CKD 3a. BMP as outpatient.  Morbid obesity. Patient would benefit from weight loss. BMI 49.  Chronic venous stasis changes of lower legs. Wound care to continue  [place] [facility] recommend home PT on discharge. Patient is from home.  Disposition.  At this time, patient is stable for disposition home. Spoke with the patient's wife and son at bedside.  Pt has appointment with Dr Annamaria Boots in October.  Pt accidently got his rt toe got stuck under counter. Difficult to remove.  Got subsequent large blister with large bruise.   Pt is using 0xygen at home at night 3L Last visit with pulmonologist 02 sat 91%. Today 91%    Review of Systems  Constitutional: Positive for fatigue. Negative for chills and fever.  Respiratory: Negative for cough, chest tightness, shortness of breath and wheezing.   Cardiovascular: Negative for chest pain and palpitations.  Gastrointestinal: Negative for abdominal pain, blood in stool, constipation and diarrhea.  Musculoskeletal: Positive for back pain.       See hpi.  Skin: Negative for rash.  Hematological: Negative for adenopathy.  Psychiatric/Behavioral: Negative for behavioral problems and decreased concentration.    Past Medical History:  Diagnosis Date  . 1st degree AV block 11/09/2018   Noted on EKG   . Anemia   . Aortic stenosis, severe    S/p Edwards Sapien 3 Transcatheter  Heart Valve (size 26 mm, model # [id number], serial # [id number])  . Arthritis   . Back pain   . Bursitis   . Cataract    left  immature  . Cellulitis 10/12/2015  . CHF (congestive heart failure) ([place])   . Complication of anesthesia    Halucinations  . Constipation    takes Miralax daily as well as Senokot daily  . DDD (degenerative disc disease)   . Gout    takes Allopurinol daily  . Heart valve disorder   . History of blood clots 1962   knee  . History of blood transfusion    no abnormal reaction noted  . History of shingles   . Hyperlipidemia    takes Atorvastatin daily  . Hypertension    takes Lisinopril daily  . Joint pain   . Low back pain 01/25/2017  . Morbid obesity ([place])   . Neck pain on left side 01/25/2017  . OSA (obstructive sleep apnea)   . Osteoarthritis   . Peripheral edema    takes Furosemide daily  . Peripheral neuropathy    takes Gabapentin daily  . Peroneal palsy    significant right foot drop  . Persistent atrial fibrillation ([place])   . Pneumonia 25+yrs ago   hx of  . Prostate cancer ([place])   . Prostate cancer ([place]) 02/13/2019  . RBBB 11/09/2018   Noted on EKG  . Shortness of breath   . Swelling of extremity   . Thrombocytopenia ([place])   . Urinary frequency   . Urinary urgency   . Valvular heart disease   . Venous stasis dermatitis      Social History   Socioeconomic History  . Marital status: Married    Spouse name: [name]  . Number of children: 3  . Years of education: Not on file  . Highest education level: Not on file  Occupational History  . Occupation: retired Nurse, mental health  . Smoking status: Former Smoker    Years: 30.00    Types: Pipe    Quit date: 09/20/2006    Years since quitting: 13.6  . Smokeless tobacco: Never Used  Vaping Use  . Vaping Use: Never used  Substance and Sexual Activity  . Alcohol use: No    Alcohol/week: 0.0 standard drinks  . Drug use: No  . Sexual activity: Not Currently    Comment: lives with wife, retired, no dietary restrictions  Other Topics Concern  . Not on file  Social History Narrative  . Not on file    Social Determinants of Health   Financial Resource Strain: Low Risk   . Difficulty of Paying Living Expenses: Not hard at all  Food Insecurity: No Food Insecurity  . Worried About Charity fundraiser in the Last Year: Never true  . Ran Out of Food in the Last Year: Never true  Transportation Needs: No Transportation Needs  . Lack of Transportation (Medical): No  . Lack of Transportation (Non-Medical): No  Physical Activity:   . Days of Exercise per Week:   . Minutes of Exercise per Session:   Stress:   . Feeling of Stress :   Social Connections:   . Frequency of Communication with Friends and Family:   . Frequency of Social Gatherings with Friends and Family:   . Attends Religious Services:   . Active Member of Clubs or Organizations:   . Attends Archivist Meetings:   [name] Marital Status:   Intimate Production manager  Violence:   . Fear of Current or Ex-Partner:   . Emotionally Abused:   [name] Physically Abused:   . Sexually Abused:     Past Surgical History:  Procedure Laterality Date  . CARDIAC CATHETERIZATION N/A 05/02/2015   Procedure: Right/Left Heart Cath and Coronary Angiography;  Surgeon: Burnell Blanks, MD;  Location: [place] [facility];  Service: Cardiovascular;  Laterality: N/A;  . CARDIOVERSION N/A 10/27/2018   Procedure: CARDIOVERSION;  Surgeon: Fay Records, MD;  Location[facility] ENDOSCOPY;  Service: Cardiovascular;  Laterality: N/A;  . COLONOSCOPY N/A 02/07/2013   Procedure: COLONOSCOPY;  Surgeon: Juanita Craver, MD;  Location: [facility] ENDOSCOPY;  Service: Endoscopy;  Laterality: N/A;  . COLONOSCOPY N/A 02/09/2013   Procedure: COLONOSCOPY;  Surgeon: Beryle Beams, MD;  Location: [place];  Service: Endoscopy;  Laterality: N/A;  . ESOPHAGOGASTRODUODENOSCOPY N/A 02/07/2013   Procedure: ESOPHAGOGASTRODUODENOSCOPY (EGD);  Surgeon: Juanita Craver, MD;  Location[facility] ENDOSCOPY;  Service: Endoscopy;  Laterality: N/A;  . GIVENS CAPSULE STUDY N/A 02/09/2013   Procedure: GIVENS  CAPSULE STUDY;  Surgeon: Beryle Beams, MD;  Location: [place];  Service: Endoscopy;  Laterality: N/A;  . HERNIA REPAIR     umbilical hernia  . KNEE SURGERY Right    multiple knee surgeries due to complication of R TKA  . LAMINOTOMY  8850   c6-t2  . PACEMAKER IMPLANT N/A 04/27/2018   Procedure: PACEMAKER IMPLANT;  Surgeon: Constance Haw, MD;  Location: [place] [facility];  Service: Cardiovascular;  Laterality: N/A;  . PROSTATE BIOPSY N/A 04/12/2019   Procedure: BIOPSY TRANSRECTAL ULTRASONIC PROSTATE (TUBP) CYSTOSCOPY;  Surgeon: [name] Hughs, MD;  Location: [facility];  Service: Urology;  Laterality: N/A;  . SPINE SURGERY    . TEE WITHOUT CARDIOVERSION N/A 03/07/2015   Procedure: TRANSESOPHAGEAL ECHOCARDIOGRAM (TEE);  Surgeon: Josue Hector, MD;  Location: [facility] ENDOSCOPY;  Service: Cardiovascular;  Laterality: N/A;  ANES TO BRING PROPOFOL PER DOCTOR  . TEE WITHOUT CARDIOVERSION N/A 06/10/2015   Procedure: TRANSESOPHAGEAL ECHOCARDIOGRAM (TEE);  Surgeon: Burnell Blanks, MD;  Location: [place];  Service: Open Heart Surgery;  Laterality: N/A;  . TOTAL KNEE ARTHROPLASTY     right   x4 left x 1   . TRANSCATHETER AORTIC VALVE REPLACEMENT, TRANSFEMORAL Left 06/10/2015   Procedure: TRANSCATHETER AORTIC VALVE REPLACEMENT, TRANSFEMORAL;  Surgeon: Burnell Blanks, MD;  Location: [place];  Service: Open Heart Surgery;  Laterality: Left;    Family History  Problem Relation Age of Onset  . Other Father        POSSIBLE HEART ATTACK  . Arthritis Sister        "crippling"  . Prostate cancer Brother   . Arthritis Sister   . Breast cancer Neg Hx   . Colon cancer Neg Hx     Allergies  Allergen Reactions  . Coumadin [Warfarin Sodium] Other (See Comments)    States he can't be on this-bleeds out    Current Outpatient Medications on File Prior to Visit  Medication Sig Dispense Refill  . acetaminophen (TYLENOL) 500 MG tablet Take 1,000 mg by mouth 2 (two) times daily.     [name]  albuterol (VENTOLIN HFA) 108 (90 Base) MCG/ACT inhaler Inhale 1-2 puffs into the lungs every 6 (six) hours as needed for wheezing or shortness of breath.    . allopurinol (ZYLOPRIM) 100 MG tablet TAKE 1 TABLET BY MOUTH EVERY DAY (Patient taking differently: Take 100 mg by mouth daily. ) 90 tablet 1  . atorvastatin (  LIPITOR) 40 MG tablet Take 1 and a half tablet daily by mouth  (60mg ) (Patient taking differently: Take 60 mg by mouth daily. ) 135 tablet 5  . Cholecalciferol (VITAMIN D3) 125 MCG (5000 UT) CAPS Take 5,000 Units by mouth daily.    . Coenzyme Q10 (COQ10) 100 MG CAPS Take 300 mg by mouth daily.     [name] CRANBERRY PO Take 1 capsule by mouth 2 (two) times a day.    [name] ELIQUIS 5 MG TABS tablet TAKE 1 TABLET BY MOUTH TWICE A DAY (Patient taking differently: Take 5 mg by mouth 2 (two) times daily. ) 60 tablet 6  . fluticasone (FLOVENT HFA) 110 MCG/ACT inhaler Inhale 2 puffs into the lungs 2 (two) times daily. 1 Inhaler 0  . furosemide (LASIX) 20 MG tablet TAKE 1 TABLET BY MOUTH TWICE A DAY (Patient taking differently: Take 20 mg by mouth 2 (two) times daily. ) 180 tablet 2  . gabapentin (NEURONTIN) 300 MG capsule 1 cap po bid and 2 caps po qhs (Patient taking differently: Take 300 mg by mouth 3 (three) times daily. ) 120 capsule 2  . HEMATINIC/FOLIC ACID 235-5 MG TABS TAKE 1 TABLET BY MOUTH EVERY DAY (Patient taking differently: Take 1 tablet by mouth daily. ) 90 tablet 1  . levocetirizine (XYZAL) 5 MG tablet Take 1 tablet (5 mg total) by mouth every evening. 90 tablet 3  . Misc Natural Products (TART CHERRY ADVANCED PO) Take 1,200 mg by mouth 2 (two) times a day.     . montelukast (SINGULAIR) 10 MG tablet Take 1 tablet (10 mg total) by mouth at bedtime. 90 tablet 3  . Multiple Vitamins-Minerals (CENTRUM SILVER PO) Take 1 tablet by mouth daily.    . Phenazopyridine HCl (AZO-STANDARD PO) Take by mouth.    . polyethylene glycol (MIRALAX / GLYCOLAX) packet Take 17 g by mouth daily.    .  sennosides-docusate sodium (SENOKOT-S) 8.6-50 MG tablet Take 1 tablet by mouth daily.     [name] tiotropium (SPIRIVA HANDIHALER) 18 MCG inhalation capsule Place 1 capsule (18 mcg total) into inhaler and inhale daily. 30 capsule 2  . vitamin C (ASCORBIC ACID) 500 MG tablet Take 500 mg by mouth 2 (two) times daily.      No current facility-administered medications on file prior to visit.    BP (!) 146/98 (BP Location: Left Arm, Patient Position: Sitting, Cuff Size: Large)   Pulse 67   Resp 18   Ht 6\' 2"  (1.88 m)   Wt (!) 370 lb (167.8 kg)   SpO2 93%   BMI 47.51 kg/m      Objective:   Physical Exam  General- No acute distress. Pleasant patient. Neck- Full range of motion, no jvd Lungs- Clear, even and unlabored. Heart- regular rate and rhythm. Neurologic- CNII- XII grossly intact  Lower ext- swollen calf, symmetric, severe statis dermatitis. Negative homans sign.  Rt great very large blister with bruised appearance.          Assessment & Plan:  Your lungs sound clear and your 02 sates are reasonably good compared to last visit with specialist and considering recent hosptalization. Continue 02 3L at night. Continue current inhalers for copd.  Please get labs today as hospital recommended on DC summar  Watch your 02 sats daily. If less than 90% then ED.  Check toe daily and if blister expands/more discoloring larger area of toe then ED evaluation. We dressed area today. Consider not to premature rupture area to  day but to defer to podiatrist.  Refer to podiatry. Asking staff to get you in by early next wee.   Refer to PT for difficult transfer and deconditioning.  Call pulmonology and schedule early next week. I placed referral in epic.  Follow up in 2 weeks pcp or as needed  Mackie Pai, PA-C   Time spent with patient today was 45  minutes which consisted of chart review, discussing diagnosis, work up, placing referral,  treatment and documentation.

## 2020-04-25 NOTE — Patient Instructions (Addendum)
Your lungs sound clear and your 02 sates are reasonably good compared to last visit with specialist and considering recent hosptalization. Continue 02 3L at night. Continue current inhalers for copd.  Please get labs today as hospital recommended on DC summar  Watch your 02 sats daily. If less than 90% then ED.  Check toe daily and if blister expands/more discoloring larger area of toe then ED evaluation. We dressed area today. Consider not to premature rupture area to day but to defer to podiatrist.  Refer to podiatry. Asking staff to get you in by early next week.   Refer to PT for difficult transfer and deconditioning.  Call pulmonology and schedule early next week. I placed referral in epic.  Follow up in 2 weeks pcp or as needed

## 2020-04-25 NOTE — Telephone Encounter (Signed)
Will you help follow up and make sure PT goes out early next week. I am out of town. And they may have difficulty.

## 2020-04-26 LAB — MAGNESIUM: Magnesium: 2.1 mg/dL (ref 1.6–2.3)

## 2020-04-28 ENCOUNTER — Ambulatory Visit
Admission: RE | Admit: 2020-04-28 | Discharge: 2020-04-28 | Disposition: A | Discharge status: Home / Self Care | Payer: Medicare HMO | Source: Ambulatory Visit | Attending: Urology | Admitting: Urology

## 2020-04-28 ENCOUNTER — Other Ambulatory Visit: Payer: Self-pay

## 2020-04-28 ENCOUNTER — Telehealth: Payer: Self-pay | Admitting: Internal Medicine

## 2020-04-28 DIAGNOSIS — M8580 Other specified disorders of bone density and structure, unspecified site: Secondary | ICD-10-CM

## 2020-04-28 DIAGNOSIS — Z0389 Encounter for observation for other suspected diseases and conditions ruled out: Secondary | ICD-10-CM | POA: Diagnosis not present

## 2020-04-28 NOTE — Telephone Encounter (Signed)
Yes ok 

## 2020-04-28 NOTE — Telephone Encounter (Signed)
Called and spoke with patient he was recently admitted into hospital on 04/16/20- 04/20/20 and was supposed to have appointment with Dr. Annamaria Boots on 04/17/20. Patient is now out of the hospital and needs follow up.   Dr. Annamaria Boots would you be ok with [name] using your RNA slot at 11:30am slot on 8/19?

## 2020-04-29 ENCOUNTER — Telehealth: Payer: Self-pay | Admitting: Family Medicine

## 2020-04-29 NOTE — Telephone Encounter (Signed)
Patient's wife just wanted to know the status of the referral and I told her the referral was placed on 8/6 and its only 8/10 , and the doctor's office will call patient to schedule an appt.

## 2020-04-29 NOTE — Telephone Encounter (Signed)
Caller: [name] Call back # [phone]  [name] is inquiring about PT referral for her husband.

## 2020-04-29 NOTE — Telephone Encounter (Signed)
[facility] does not have any avail RNA slots for this week as they have all been filled. First avail appt for pt to be placed in one of [facility]'s RNA held slots is not until 8/19 at 11:30.  Attempted to call pt to see if I could get him a hospital f/u appt scheduled with  [facility] but unable to reach. Left message for pt to return call.  When pt returns call, please schedule pt for a hospital follow up appt with [facility]. [facility] stated that it is okay for [name] to schedule pt in one of his RNA held slots.

## 2020-04-30 NOTE — Telephone Encounter (Signed)
Spoke with the pt and scheduled HFU for 05/08/20 at 11:30 am.

## 2020-05-01 DIAGNOSIS — M8580 Other specified disorders of bone density and structure, unspecified site: Secondary | ICD-10-CM | POA: Diagnosis not present

## 2020-05-01 DIAGNOSIS — C61 Malignant neoplasm of prostate: Secondary | ICD-10-CM | POA: Diagnosis not present

## 2020-05-01 DIAGNOSIS — E559 Vitamin D deficiency, unspecified: Secondary | ICD-10-CM | POA: Diagnosis not present

## 2020-05-02 DIAGNOSIS — I4819 Other persistent atrial fibrillation: Secondary | ICD-10-CM | POA: Diagnosis not present

## 2020-05-02 DIAGNOSIS — I5032 Chronic diastolic (congestive) heart failure: Secondary | ICD-10-CM | POA: Diagnosis not present

## 2020-05-02 DIAGNOSIS — G629 Polyneuropathy, unspecified: Secondary | ICD-10-CM | POA: Diagnosis not present

## 2020-05-02 DIAGNOSIS — S91101D Unspecified open wound of right great toe without damage to nail, subsequent encounter: Secondary | ICD-10-CM | POA: Diagnosis not present

## 2020-05-02 DIAGNOSIS — I4510 Unspecified right bundle-branch block: Secondary | ICD-10-CM | POA: Diagnosis not present

## 2020-05-02 DIAGNOSIS — J9612 Chronic respiratory failure with hypercapnia: Secondary | ICD-10-CM | POA: Diagnosis not present

## 2020-05-02 DIAGNOSIS — I130 Hypertensive heart and chronic kidney disease with heart failure and stage 1 through stage 4 chronic kidney disease, or unspecified chronic kidney disease: Secondary | ICD-10-CM | POA: Diagnosis not present

## 2020-05-02 DIAGNOSIS — J9611 Chronic respiratory failure with hypoxia: Secondary | ICD-10-CM | POA: Diagnosis not present

## 2020-05-02 DIAGNOSIS — N1831 Chronic kidney disease, stage 3a: Secondary | ICD-10-CM | POA: Diagnosis not present

## 2020-05-02 DIAGNOSIS — J441 Chronic obstructive pulmonary disease with (acute) exacerbation: Secondary | ICD-10-CM | POA: Diagnosis not present

## 2020-05-05 ENCOUNTER — Ambulatory Visit: Payer: Medicare HMO | Admitting: Podiatry

## 2020-05-06 ENCOUNTER — Ambulatory Visit (INDEPENDENT_AMBULATORY_CARE_PROVIDER_SITE_OTHER): Payer: Medicare HMO | Admitting: *Deleted

## 2020-05-06 DIAGNOSIS — I441 Atrioventricular block, second degree: Secondary | ICD-10-CM | POA: Diagnosis not present

## 2020-05-06 LAB — CUP PACEART REMOTE DEVICE CHECK
Battery Remaining Longevity: 122 mo
Battery Remaining Percentage: 95.5 %
Battery Voltage: 3.01 V
Brady Statistic AP VP Percent: 44 %
Brady Statistic AP VS Percent: 1 %
Brady Statistic AS VP Percent: 39 %
Brady Statistic AS VS Percent: 16 %
Brady Statistic RA Percent Paced: 45 %
Brady Statistic RV Percent Paced: 83 %
Date Time Interrogation Session: 20210816030843
Implantable Lead Implant Date: 20190808
Implantable Lead Implant Date: 20190808
Implantable Lead Location: 753859
Implantable Lead Location: 753860
Implantable Pulse Generator Implant Date: 20190808
Lead Channel Impedance Value: 400 Ohm
Lead Channel Impedance Value: 480 Ohm
Lead Channel Pacing Threshold Amplitude: 0.625 V
Lead Channel Pacing Threshold Amplitude: 0.75 V
Lead Channel Pacing Threshold Pulse Width: 0.5 ms
Lead Channel Pacing Threshold Pulse Width: 0.5 ms
Lead Channel Sensing Intrinsic Amplitude: 12 mV
Lead Channel Sensing Intrinsic Amplitude: 3.8 mV
Lead Channel Setting Pacing Amplitude: 1 V
Lead Channel Setting Pacing Amplitude: 1.625
Lead Channel Setting Pacing Pulse Width: 0.5 ms
Lead Channel Setting Sensing Sensitivity: 2.5 mV
Pulse Gen Model: 2272
Pulse Gen Serial Number: 9052893

## 2020-05-07 DIAGNOSIS — I5032 Chronic diastolic (congestive) heart failure: Secondary | ICD-10-CM | POA: Diagnosis not present

## 2020-05-07 DIAGNOSIS — I4510 Unspecified right bundle-branch block: Secondary | ICD-10-CM | POA: Diagnosis not present

## 2020-05-07 DIAGNOSIS — I4819 Other persistent atrial fibrillation: Secondary | ICD-10-CM | POA: Diagnosis not present

## 2020-05-07 DIAGNOSIS — G629 Polyneuropathy, unspecified: Secondary | ICD-10-CM | POA: Diagnosis not present

## 2020-05-07 DIAGNOSIS — S91101D Unspecified open wound of right great toe without damage to nail, subsequent encounter: Secondary | ICD-10-CM | POA: Diagnosis not present

## 2020-05-07 DIAGNOSIS — J9611 Chronic respiratory failure with hypoxia: Secondary | ICD-10-CM | POA: Diagnosis not present

## 2020-05-07 DIAGNOSIS — N1831 Chronic kidney disease, stage 3a: Secondary | ICD-10-CM | POA: Diagnosis not present

## 2020-05-07 DIAGNOSIS — J9612 Chronic respiratory failure with hypercapnia: Secondary | ICD-10-CM | POA: Diagnosis not present

## 2020-05-07 DIAGNOSIS — I130 Hypertensive heart and chronic kidney disease with heart failure and stage 1 through stage 4 chronic kidney disease, or unspecified chronic kidney disease: Secondary | ICD-10-CM | POA: Diagnosis not present

## 2020-05-07 DIAGNOSIS — J441 Chronic obstructive pulmonary disease with (acute) exacerbation: Secondary | ICD-10-CM | POA: Diagnosis not present

## 2020-05-08 ENCOUNTER — Encounter: Payer: Self-pay | Admitting: Internal Medicine

## 2020-05-08 ENCOUNTER — Ambulatory Visit: Payer: Medicare HMO | Admitting: Internal Medicine

## 2020-05-08 ENCOUNTER — Ambulatory Visit (INDEPENDENT_AMBULATORY_CARE_PROVIDER_SITE_OTHER)
Admission: RE | Admit: 2020-05-08 | Discharge: 2020-05-08 | Disposition: A | Discharge status: Home / Self Care | Payer: Medicare HMO | Source: Ambulatory Visit | Attending: Internal Medicine | Admitting: Internal Medicine

## 2020-05-08 ENCOUNTER — Other Ambulatory Visit: Payer: Self-pay

## 2020-05-08 VITALS — BP 138/88 | HR 62 | Temp 97.8°F | Ht 74.0 in | Wt >= 6400 oz

## 2020-05-08 DIAGNOSIS — J9611 Chronic respiratory failure with hypoxia: Secondary | ICD-10-CM

## 2020-05-08 DIAGNOSIS — J449 Chronic obstructive pulmonary disease, unspecified: Secondary | ICD-10-CM | POA: Diagnosis not present

## 2020-05-08 DIAGNOSIS — I517 Cardiomegaly: Secondary | ICD-10-CM | POA: Diagnosis not present

## 2020-05-08 DIAGNOSIS — R0902 Hypoxemia: Secondary | ICD-10-CM | POA: Diagnosis not present

## 2020-05-08 DIAGNOSIS — G4733 Obstructive sleep apnea (adult) (pediatric): Secondary | ICD-10-CM | POA: Diagnosis not present

## 2020-05-08 DIAGNOSIS — J9690 Respiratory failure, unspecified, unspecified whether with hypoxia or hypercapnia: Secondary | ICD-10-CM | POA: Diagnosis not present

## 2020-05-08 MED ORDER — BREZTRI AEROSPHERE 160-9-4.8 MCG/ACT IN AERO: 2.0000 | INHALATION_SPRAY | Freq: Two times a day (BID) | RESPIRATORY_TRACT | 0 refills | Status: DC

## 2020-05-08 NOTE — Patient Instructions (Addendum)
Sample x 2 Breztri inhaler    Inhale 2 puffs, then rinse mouth, twice daily Try this instead your other inhalers. See if you think it helps your breathing.  Order- CXR   Dx Chronic Respiratory Failure with Hypoxia and Hypercapnia  Suggest you talk with Dr Charlett Blake about Assisted Living/ Senior Living Options for you and your wife.  Please call if we can help

## 2020-05-08 NOTE — Progress Notes (Signed)
HPI M former smoker followed for OSA, COPD, chronic hypoxic respiratory failure complicated by morbid obesity, OHS, aortic stenosis/ AVR,  Osteoarthritis, Anemia PFT 05/05/2015-severe obstructive airways disease, insignificant response to bronchodilator, severe restriction, moderate diffusion defect FVC 2.17/46%, FEV1 1.50/44%, ratio 0.69, TLC 64%, DLCO 57% with volume correction to 62% of predicted NPSG 06/21/85- AHI 98/hour with desaturation to 64% BiPAP titration study-21/17, PS 4 cwp O2 2L sleep, with residual AHI 52 "unknown" events, either obstructive or central and presumably mostly RERAs.  ------------------------------------------------------------   02/26/20- 79 year old male former smoker followed for OSA, COPD mixed type, Chronic Respiratory failure with Hypoxia complicated by morbid obesity/ OHS, aortic stenosis/TAVR/ pacemaker, peripheral vascular disease, osteoarthritis, AVM colon, HBP, gout, Prostate cancer Body weight today 340 lbs VPAPauto 12/12, PS 4/ O2 2L sleep/ [employer] Download compliance 100%, AHI 0.6/ hr      Doing very well with VPAP, used every night. Has had 2 Moderna Covax. Concern today is need to document ongoing need for O2 with sleep to continue his concentrator. Definitely helps breathing at night through VPAP.   05/08/20- 79 year old male former smoker followed for OSA, COPD mixed type, Chronic Respiratory failure with Hypoxia and Hypercapnia, complicated by morbid Obesity/ OHS, aortic stenosis/TAVR/ PAFib/ Eliquis, pacemaker, peripheral vascular disease, osteoarthritis, AVM colon, HBP, gout, Prostate cancer, HTN,  Body weight today- 400 lbs in hosp VPAPauto 12/12, PS 4/ O2 2L sleep/ [employer] Download compliance 100%, AHI 0.4/ hr Hosp f/u- 7/28-04/20/20-   Acute on chronic Resp Failure with Hypoxia and Hypercapnia He had taken extra oxycodone prior to admission. Managed in ICU with BIPAP.   Flovent  added at discharge. Short course prednisone, lasix.  Spiriva Handihaler,  Flovent 110, Ventolin HFA, Singulair Had 2 Moderna Covax Wife getting dementia and uses walker. I suggested they start talking about assisted living.  CXR 04/17/20- IMPRESSION: Small left pleural effusion with left base atelectasis. No consolidation. Stable cardiomegaly. Pacemaker leads attached to right atrium and right ventricle. Aortic Atherosclerosis (ICD10-I70.0).   ROS- see HPI  + = positive Constitutional:   No-   weight loss, night sweats, fevers, chills, +fatigue, lassitude. HEENT:   No-  headaches, difficulty swallowing, tooth/dental problems, sore throat,       No-  sneezing, itching, ear ache, nasal congestion, post nasal drip,  CV:  No-   chest pain, orthopnea, PND, swelling in lower extremities, anasarca,  dizziness, palpitations Resp: +shortness of breath with exertion or at rest.              No-   productive cough,  No non-productive cough,  No- coughing up of blood.              No-   change in color of mucus.  No- wheezing.   Skin: No-   rash or lesions. GI:  No-   heartburn, indigestion, abdominal pain, nausea, vomiting, GU:  MS:  No-   joint pain or swelling.  + back pain Neuro-     nothing unusual Psych:  No- change in mood or affect. No depression or anxiety.  No memory loss.  OBJ General- Alert, Oriented, Affect-appropriate, Distress- none acute. +Morbidly obese, +power wheelchair  Skin- rash-none, lesions- none, excoriation- none Lymphadenopathy- none Head- atraumatic            Eyes- Gross vision intact, PERRLA, conjunctivae clear secretions            Ears- Hearing, canals-normal            Nose- Clear, no-Septal dev,  mucus, polyps, erosion, perforation             Throat- Mallampati III-IV , mucosa clear , drainage- none, tonsils- atrophic Neck- flexible , trachea midline, no stridor , thyroid nl, carotid no bruit Chest - symmetrical excursion , unlabored           Heart/CV- RRR , murmur-none, no gallop, no rub, nl s1 s2                           -  JVD- none , edema+, stasis changes+, varices- none           Lung- clear to P&A, wheeze- none, cough- none , dullness-none, rub- none, no valve click heard                           Room air saturation while awake and sitting upright on arrival today 91%           Chest wall-+ pacemaker left  abd-  Br/ Gen/ Rectal- Not done, not indicated Extrem- cyanosis- none, clubbing, none, atrophy- none, strength- nl Neuro- grossly intact to observation, alert and pleasant

## 2020-05-08 NOTE — Progress Notes (Signed)
Remote pacemaker transmission.   

## 2020-05-09 ENCOUNTER — Ambulatory Visit (HOSPITAL_COMMUNITY): Payer: Medicare HMO | Attending: Cardiology

## 2020-05-09 DIAGNOSIS — I059 Rheumatic mitral valve disease, unspecified: Secondary | ICD-10-CM

## 2020-05-09 DIAGNOSIS — Z952 Presence of prosthetic heart valve: Secondary | ICD-10-CM | POA: Insufficient documentation

## 2020-05-09 LAB — ECHOCARDIOGRAM COMPLETE
AV Mean grad: 8.5 mmHg
AV Peak grad: 18 mmHg
Ao pk vel: 2.12 m/s
Area-P 1/2: 1.47 cm2
S' Lateral: 2.8 cm

## 2020-05-10 DIAGNOSIS — J9622 Acute and chronic respiratory failure with hypercapnia: Secondary | ICD-10-CM | POA: Diagnosis not present

## 2020-05-10 DIAGNOSIS — R062 Wheezing: Secondary | ICD-10-CM | POA: Diagnosis not present

## 2020-05-10 DIAGNOSIS — J449 Chronic obstructive pulmonary disease, unspecified: Secondary | ICD-10-CM | POA: Diagnosis not present

## 2020-05-10 DIAGNOSIS — G4733 Obstructive sleep apnea (adult) (pediatric): Secondary | ICD-10-CM | POA: Diagnosis not present

## 2020-05-11 ENCOUNTER — Other Ambulatory Visit: Payer: Self-pay | Admitting: Family Medicine

## 2020-05-11 DIAGNOSIS — M1A9XX0 Chronic gout, unspecified, without tophus (tophi): Secondary | ICD-10-CM

## 2020-05-14 DIAGNOSIS — J449 Chronic obstructive pulmonary disease, unspecified: Secondary | ICD-10-CM | POA: Diagnosis not present

## 2020-05-14 DIAGNOSIS — G4733 Obstructive sleep apnea (adult) (pediatric): Secondary | ICD-10-CM | POA: Diagnosis not present

## 2020-05-14 DIAGNOSIS — D044 Carcinoma in situ of skin of scalp and neck: Secondary | ICD-10-CM | POA: Diagnosis not present

## 2020-05-15 ENCOUNTER — Other Ambulatory Visit: Payer: Self-pay

## 2020-05-15 ENCOUNTER — Ambulatory Visit (INDEPENDENT_AMBULATORY_CARE_PROVIDER_SITE_OTHER): Payer: Medicare HMO | Admitting: Family Medicine

## 2020-05-15 ENCOUNTER — Ambulatory Visit (HOSPITAL_BASED_OUTPATIENT_CLINIC_OR_DEPARTMENT_OTHER)
Admission: RE | Admit: 2020-05-15 | Discharge: 2020-05-15 | Disposition: A | Discharge status: Home / Self Care | Payer: Medicare HMO | Source: Ambulatory Visit | Attending: Family Medicine | Admitting: Family Medicine

## 2020-05-15 VITALS — BP 101/43 | HR 74 | Temp 98.6°F | Resp 14 | Ht 74.0 in

## 2020-05-15 DIAGNOSIS — S99921A Unspecified injury of right foot, initial encounter: Secondary | ICD-10-CM

## 2020-05-15 DIAGNOSIS — I10 Essential (primary) hypertension: Secondary | ICD-10-CM | POA: Diagnosis not present

## 2020-05-15 DIAGNOSIS — I359 Nonrheumatic aortic valve disorder, unspecified: Secondary | ICD-10-CM

## 2020-05-15 DIAGNOSIS — R739 Hyperglycemia, unspecified: Secondary | ICD-10-CM

## 2020-05-15 DIAGNOSIS — N289 Disorder of kidney and ureter, unspecified: Secondary | ICD-10-CM

## 2020-05-15 DIAGNOSIS — M7989 Other specified soft tissue disorders: Secondary | ICD-10-CM | POA: Diagnosis not present

## 2020-05-15 MED ORDER — CEPHALEXIN 500 MG PO CAPS: 500.0000 mg | ORAL_CAPSULE | Freq: Four times a day (QID) | ORAL | 0 refills | Status: DC

## 2020-05-16 ENCOUNTER — Telehealth: Payer: Self-pay | Admitting: Family Medicine

## 2020-05-16 DIAGNOSIS — J441 Chronic obstructive pulmonary disease with (acute) exacerbation: Secondary | ICD-10-CM | POA: Diagnosis not present

## 2020-05-16 DIAGNOSIS — S91101D Unspecified open wound of right great toe without damage to nail, subsequent encounter: Secondary | ICD-10-CM | POA: Diagnosis not present

## 2020-05-16 DIAGNOSIS — J9611 Chronic respiratory failure with hypoxia: Secondary | ICD-10-CM | POA: Diagnosis not present

## 2020-05-16 DIAGNOSIS — I130 Hypertensive heart and chronic kidney disease with heart failure and stage 1 through stage 4 chronic kidney disease, or unspecified chronic kidney disease: Secondary | ICD-10-CM | POA: Diagnosis not present

## 2020-05-16 DIAGNOSIS — N1831 Chronic kidney disease, stage 3a: Secondary | ICD-10-CM | POA: Diagnosis not present

## 2020-05-16 DIAGNOSIS — I4510 Unspecified right bundle-branch block: Secondary | ICD-10-CM | POA: Diagnosis not present

## 2020-05-16 DIAGNOSIS — G629 Polyneuropathy, unspecified: Secondary | ICD-10-CM | POA: Diagnosis not present

## 2020-05-16 DIAGNOSIS — I5032 Chronic diastolic (congestive) heart failure: Secondary | ICD-10-CM | POA: Diagnosis not present

## 2020-05-16 DIAGNOSIS — J9612 Chronic respiratory failure with hypercapnia: Secondary | ICD-10-CM | POA: Diagnosis not present

## 2020-05-16 DIAGNOSIS — I4819 Other persistent atrial fibrillation: Secondary | ICD-10-CM | POA: Diagnosis not present

## 2020-05-16 NOTE — Telephone Encounter (Signed)
Verbal Order given to Estill Bamberg at [facility]

## 2020-05-16 NOTE — Telephone Encounter (Signed)
[facility]  Call back # [phone]  Per Estill Bamberg, she would like to send a nurse out to look at patient legs.Patient was rash, wounds on legs.  Please Advise if ok

## 2020-05-17 DIAGNOSIS — G4733 Obstructive sleep apnea (adult) (pediatric): Secondary | ICD-10-CM | POA: Diagnosis not present

## 2020-05-17 DIAGNOSIS — R062 Wheezing: Secondary | ICD-10-CM | POA: Diagnosis not present

## 2020-05-17 NOTE — Assessment & Plan Note (Signed)
Not vclear that bronchospasm is significant. Plan- try samples Breztri for effect

## 2020-05-17 NOTE — Assessment & Plan Note (Signed)
He remains O2 dependent. Core problem is OHS> COPD Plan- continue O2

## 2020-05-17 NOTE — Assessment & Plan Note (Signed)
Benefits from his BIPAP with good compliance and contyrol Plan- continue VPAP auto 12/12 PS4 O2 2L

## 2020-05-19 DIAGNOSIS — J9612 Chronic respiratory failure with hypercapnia: Secondary | ICD-10-CM | POA: Diagnosis not present

## 2020-05-19 DIAGNOSIS — I4510 Unspecified right bundle-branch block: Secondary | ICD-10-CM | POA: Diagnosis not present

## 2020-05-19 DIAGNOSIS — J441 Chronic obstructive pulmonary disease with (acute) exacerbation: Secondary | ICD-10-CM | POA: Diagnosis not present

## 2020-05-19 DIAGNOSIS — I5032 Chronic diastolic (congestive) heart failure: Secondary | ICD-10-CM | POA: Diagnosis not present

## 2020-05-19 DIAGNOSIS — G629 Polyneuropathy, unspecified: Secondary | ICD-10-CM | POA: Diagnosis not present

## 2020-05-19 DIAGNOSIS — S91101D Unspecified open wound of right great toe without damage to nail, subsequent encounter: Secondary | ICD-10-CM | POA: Diagnosis not present

## 2020-05-19 DIAGNOSIS — I130 Hypertensive heart and chronic kidney disease with heart failure and stage 1 through stage 4 chronic kidney disease, or unspecified chronic kidney disease: Secondary | ICD-10-CM | POA: Diagnosis not present

## 2020-05-19 DIAGNOSIS — J9611 Chronic respiratory failure with hypoxia: Secondary | ICD-10-CM | POA: Diagnosis not present

## 2020-05-19 DIAGNOSIS — I4819 Other persistent atrial fibrillation: Secondary | ICD-10-CM | POA: Diagnosis not present

## 2020-05-19 DIAGNOSIS — N1831 Chronic kidney disease, stage 3a: Secondary | ICD-10-CM | POA: Diagnosis not present

## 2020-05-19 NOTE — Progress Notes (Signed)
Patient has been notified of results and patient's wife has indicated his toe is showing great improvement and healing well.  [place]

## 2020-05-20 ENCOUNTER — Telehealth: Payer: Self-pay | Admitting: Family Medicine

## 2020-05-20 DIAGNOSIS — J441 Chronic obstructive pulmonary disease with (acute) exacerbation: Secondary | ICD-10-CM | POA: Diagnosis not present

## 2020-05-20 DIAGNOSIS — J9611 Chronic respiratory failure with hypoxia: Secondary | ICD-10-CM | POA: Diagnosis not present

## 2020-05-20 DIAGNOSIS — S91101D Unspecified open wound of right great toe without damage to nail, subsequent encounter: Secondary | ICD-10-CM | POA: Diagnosis not present

## 2020-05-20 DIAGNOSIS — I4510 Unspecified right bundle-branch block: Secondary | ICD-10-CM | POA: Diagnosis not present

## 2020-05-20 DIAGNOSIS — I5032 Chronic diastolic (congestive) heart failure: Secondary | ICD-10-CM | POA: Diagnosis not present

## 2020-05-20 DIAGNOSIS — I130 Hypertensive heart and chronic kidney disease with heart failure and stage 1 through stage 4 chronic kidney disease, or unspecified chronic kidney disease: Secondary | ICD-10-CM | POA: Diagnosis not present

## 2020-05-20 DIAGNOSIS — J9612 Chronic respiratory failure with hypercapnia: Secondary | ICD-10-CM | POA: Diagnosis not present

## 2020-05-20 DIAGNOSIS — G629 Polyneuropathy, unspecified: Secondary | ICD-10-CM | POA: Diagnosis not present

## 2020-05-20 DIAGNOSIS — N1831 Chronic kidney disease, stage 3a: Secondary | ICD-10-CM | POA: Diagnosis not present

## 2020-05-20 DIAGNOSIS — S99921A Unspecified injury of right foot, initial encounter: Secondary | ICD-10-CM | POA: Insufficient documentation

## 2020-05-20 DIAGNOSIS — I4819 Other persistent atrial fibrillation: Secondary | ICD-10-CM | POA: Diagnosis not present

## 2020-05-20 NOTE — Assessment & Plan Note (Signed)
Hydrate and monitor 

## 2020-05-20 NOTE — Assessment & Plan Note (Addendum)
Patient with hematoma on right great toe. Xray confirms soft tissue swelling butn o sign of fracture or infection. Large congealed hematoma debrided with sterile scalpel and qtips. Tolerated well. No bleeding noted. Good pink tissue under hematoma. Started on antibiotics and return in one week for re evaluation or sooner or as needed. Continue to wash daily with mild soap and water. Keep wrapped and dry.

## 2020-05-20 NOTE — Assessment & Plan Note (Signed)
Continues to follow with cardiology and is s/p TAVR

## 2020-05-20 NOTE — Telephone Encounter (Signed)
Looks like patient did get in with pulmonology.  Do you want pt to call them about this issue?

## 2020-05-20 NOTE — Progress Notes (Signed)
Subjective:    Patient ID: [name], male    DOB: December 24, 1940, 79 y.o.   MRN: [id number]  Chief Complaint  Patient presents with  . 2 Week Follow-up    HPI Patient is in today for follow up on chronic medical concerns. No recent febrile illness or hospitalizations. He injured his right great toe on some furniture in the bedroom. He denies any pain or fevers. He reports he otherwise feels well. He continues to be wheelchair bound but he is managing his ADLs at home with same level of ability and family assistance. Denies CP/palp/HA/congestion/fevers/GI or GU c/o. Taking meds as prescribed  Past Medical History:  Diagnosis Date  . 1st degree AV block 11/09/2018   Noted on EKG   . Anemia   . Aortic stenosis, severe    S/p Edwards Sapien 3 Transcatheter Heart Valve (size 26 mm, model # [id number], serial # [id number])  . Arthritis   . Back pain   . Bursitis   . Cataract    left immature  . Cellulitis 10/12/2015  . CHF (congestive heart failure) ([place])   . Complication of anesthesia    Halucinations  . Constipation    takes Miralax daily as well as Senokot daily  . DDD (degenerative disc disease)   . Gout    takes Allopurinol daily  . Heart valve disorder   . History of blood clots 1962   knee  . History of blood transfusion    no abnormal reaction noted  . History of shingles   . Hyperlipidemia    takes Atorvastatin daily  . Hypertension    takes Lisinopril daily  . Joint pain   . Low back pain 01/25/2017  . Morbid obesity ([place])   . Neck pain on left side 01/25/2017  . OSA (obstructive sleep apnea)   . Osteoarthritis   . Peripheral edema    takes Furosemide daily  . Peripheral neuropathy    takes Gabapentin daily  . Peroneal palsy    significant right foot drop  . Persistent atrial fibrillation ([place])   . Pneumonia 25+yrs ago   hx of  . Prostate cancer ([place])   . Prostate cancer ([place]) 02/13/2019  . RBBB 11/09/2018   Noted on EKG  . Shortness of breath   .  Swelling of extremity   . Thrombocytopenia ([place])   . Urinary frequency   . Urinary urgency   . Valvular heart disease   . Venous stasis dermatitis     Past Surgical History:  Procedure Laterality Date  . CARDIAC CATHETERIZATION N/A 05/02/2015   Procedure: Right/Left Heart Cath and Coronary Angiography;  Surgeon: Burnell Blanks, MD;  Location: [place] [facility];  Service: Cardiovascular;  Laterality: N/A;  . CARDIOVERSION N/A 10/27/2018   Procedure: CARDIOVERSION;  Surgeon: Fay Records, MD;  Location: [facility] ENDOSCOPY;  Service: Cardiovascular;  Laterality: N/A;  . COLONOSCOPY N/A 02/07/2013   Procedure: COLONOSCOPY;  Surgeon: Juanita Craver, MD;  Location: [facility] ENDOSCOPY;  Service: Endoscopy;  Laterality: N/A;  . COLONOSCOPY N/A 02/09/2013   Procedure: COLONOSCOPY;  Surgeon: Beryle Beams, MD;  Location: [place];  Service: Endoscopy;  Laterality: N/A;  . ESOPHAGOGASTRODUODENOSCOPY N/A 02/07/2013   Procedure: ESOPHAGOGASTRODUODENOSCOPY (EGD);  Surgeon: Juanita Craver, MD;  Location: [facility] ENDOSCOPY;  Service: Endoscopy;  Laterality: N/A;  . GIVENS CAPSULE STUDY N/A 02/09/2013   Procedure: GIVENS CAPSULE STUDY;  Surgeon: Beryle Beams, MD;  Location: [place];  Service: Endoscopy;  Laterality: N/A;  .  HERNIA REPAIR     umbilical hernia  . KNEE SURGERY Right    multiple knee surgeries due to complication of R TKA  . LAMINOTOMY  3762   c6-t2  . PACEMAKER IMPLANT N/A 04/27/2018   Procedure: PACEMAKER IMPLANT;  Surgeon: Constance Haw, MD;  Location: [place] [facility];  Service: Cardiovascular;  Laterality: N/A;  . PROSTATE BIOPSY N/A 04/12/2019   Procedure: BIOPSY TRANSRECTAL ULTRASONIC PROSTATE (TUBP) CYSTOSCOPY;  Surgeon: [name] Hughs, MD;  Location: [facility];  Service: Urology;  Laterality: N/A;  . SPINE SURGERY    . TEE WITHOUT CARDIOVERSION N/A 03/07/2015   Procedure: TRANSESOPHAGEAL ECHOCARDIOGRAM (TEE);  Surgeon: Josue Hector, MD;  Location: [facility] ENDOSCOPY;  Service:  Cardiovascular;  Laterality: N/A;  ANES TO BRING PROPOFOL PER DOCTOR  . TEE WITHOUT CARDIOVERSION N/A 06/10/2015   Procedure: TRANSESOPHAGEAL ECHOCARDIOGRAM (TEE);  Surgeon: Burnell Blanks, MD;  Location: [place];  Service: Open Heart Surgery;  Laterality: N/A;  . TOTAL KNEE ARTHROPLASTY     right   x4 left x 1   . TRANSCATHETER AORTIC VALVE REPLACEMENT, TRANSFEMORAL Left 06/10/2015   Procedure: TRANSCATHETER AORTIC VALVE REPLACEMENT, TRANSFEMORAL;  Surgeon: Burnell Blanks, MD;  Location: [place];  Service: Open Heart Surgery;  Laterality: Left;    Family History  Problem Relation Age of Onset  . Other Father        POSSIBLE HEART ATTACK  . Arthritis Sister        "crippling"  . Prostate cancer Brother   . Arthritis Sister   . Breast cancer Neg Hx   . Colon cancer Neg Hx     Social History   Socioeconomic History  . Marital status: Married    Spouse name: [name]  . Number of children: 3  . Years of education: Not on file  . Highest education level: Not on file  Occupational History  . Occupation: retired Nurse, mental health  . Smoking status: Former Smoker    Years: 30.00    Types: Pipe    Quit date: 09/20/2006    Years since quitting: 13.6  . Smokeless tobacco: Never Used  Vaping Use  . Vaping Use: Never used  Substance and Sexual Activity  . Alcohol use: No    Alcohol/week: 0.0 standard drinks  . Drug use: No  . Sexual activity: Not Currently    Comment: lives with wife, retired, no dietary restrictions  Other Topics Concern  . Not on file  Social History Narrative  . Not on file   Social Determinants of Health   Financial Resource Strain: Low Risk   . Difficulty of Paying Living Expenses: Not hard at all  Food Insecurity: No Food Insecurity  . Worried About Charity fundraiser in the Last Year: Never true  . Ran Out of Food in the Last Year: Never true  Transportation Needs: No Transportation Needs  . Lack of Transportation (Medical): No  .  Lack of Transportation (Non-Medical): No  Physical Activity:   . Days of Exercise per Week: Not on file  . Minutes of Exercise per Session: Not on file  Stress:   . Feeling of Stress : Not on file  Social Connections:   . Frequency of Communication with Friends and Family: Not on file  . Frequency of Social Gatherings with Friends and Family: Not on file  . Attends Religious Services: Not on file  . Active Member of Clubs or Organizations: Not on file  .  Attends Archivist Meetings: Not on file  . Marital Status: Not on file  Intimate Partner Violence:   . Fear of Current or Ex-Partner: Not on file  . Emotionally Abused: Not on file  . Physically Abused: Not on file  . Sexually Abused: Not on file    Outpatient Medications Prior to Visit  Medication Sig Dispense Refill  . acetaminophen (TYLENOL) 500 MG tablet Take 1,000 mg by mouth 2 (two) times daily.     [name] albuterol (VENTOLIN HFA) 108 (90 Base) MCG/ACT inhaler Inhale 1-2 puffs into the lungs every 6 (six) hours as needed for wheezing or shortness of breath.    . allopurinol (ZYLOPRIM) 100 MG tablet Take 1 tablet (100 mg total) by mouth daily. 90 tablet 3  . atorvastatin (LIPITOR) 40 MG tablet Take 1 and a half tablet daily by mouth  (60mg ) (Patient taking differently: Take 60 mg by mouth daily. ) 135 tablet 5  . Budeson-Glycopyrrol-Formoterol (BREZTRI AEROSPHERE) 160-9-4.8 MCG/ACT AERO Inhale 2 puffs into the lungs 2 (two) times daily. 4.8 g 0  . Cholecalciferol (VITAMIN D3) 125 MCG (5000 UT) CAPS Take 5,000 Units by mouth daily.    . Coenzyme Q10 (COQ10) 100 MG CAPS Take 300 mg by mouth daily.     [name] CRANBERRY PO Take 1 capsule by mouth 2 (two) times a day.    [name] ELIQUIS 5 MG TABS tablet TAKE 1 TABLET BY MOUTH TWICE A DAY (Patient taking differently: Take 5 mg by mouth 2 (two) times daily. ) 60 tablet 6  . fluticasone (FLOVENT HFA) 110 MCG/ACT inhaler Inhale 2 puffs into the lungs 2 (two) times daily. 1 Inhaler 0  .  furosemide (LASIX) 20 MG tablet TAKE 1 TABLET BY MOUTH TWICE A DAY (Patient taking differently: Take 20 mg by mouth 2 (two) times daily. ) 180 tablet 2  . gabapentin (NEURONTIN) 300 MG capsule 1 cap po bid and 2 caps po qhs (Patient taking differently: Take 300 mg by mouth 3 (three) times daily. ) 120 capsule 2  . HEMATINIC/FOLIC ACID 209-4 MG TABS TAKE 1 TABLET BY MOUTH EVERY DAY (Patient taking differently: Take 1 tablet by mouth daily. ) 90 tablet 1  . levocetirizine (XYZAL) 5 MG tablet Take 1 tablet (5 mg total) by mouth every evening. 90 tablet 3  . Misc Natural Products (TART CHERRY ADVANCED PO) Take 1,200 mg by mouth 2 (two) times a day.     . montelukast (SINGULAIR) 10 MG tablet Take 1 tablet (10 mg total) by mouth at bedtime. 90 tablet 3  . Multiple Vitamins-Minerals (CENTRUM SILVER PO) Take 1 tablet by mouth daily.    . Phenazopyridine HCl (AZO-STANDARD PO) Take by mouth.    . polyethylene glycol (MIRALAX / GLYCOLAX) packet Take 17 g by mouth daily.    . sennosides-docusate sodium (SENOKOT-S) 8.6-50 MG tablet Take 1 tablet by mouth daily.     [name] tiotropium (SPIRIVA HANDIHALER) 18 MCG inhalation capsule Place 1 capsule (18 mcg total) into inhaler and inhale daily. 30 capsule 2  . vitamin C (ASCORBIC ACID) 500 MG tablet Take 500 mg by mouth 2 (two) times daily.      No facility-administered medications prior to visit.    Allergies  Allergen Reactions  . Coumadin [Warfarin Sodium] Other (See Comments)    States he can't be on this-bleeds out    Review of Systems  Constitutional: Positive for malaise/fatigue. Negative for fever.  HENT: Negative for congestion.   Eyes: Negative for  blurred vision.  Respiratory: Positive for shortness of breath.   Cardiovascular: Positive for leg swelling. Negative for chest pain and palpitations.  Gastrointestinal: Negative for abdominal pain, blood in stool and nausea.  Genitourinary: Negative for dysuria and frequency.  Musculoskeletal: Positive  for myalgias. Negative for falls.       Injury right great toe  Skin: Negative for rash.  Neurological: Negative for dizziness, loss of consciousness and headaches.  Endo/Heme/Allergies: Negative for environmental allergies.  Psychiatric/Behavioral: Negative for depression. The patient is not nervous/anxious.        Objective:    Physical Exam Vitals and nursing note reviewed.  Constitutional:      General: He is not in acute distress.    Appearance: He is well-developed. He is obese.  HENT:     Head: Normocephalic and atraumatic.     Nose: Nose normal.  Eyes:     General:        Right eye: No discharge.        Left eye: No discharge.  Cardiovascular:     Rate and Rhythm: Normal rate and regular rhythm.     Heart sounds: Murmur heard.   Pulmonary:     Effort: Pulmonary effort is normal.     Breath sounds: Normal breath sounds.  Abdominal:     General: Bowel sounds are normal.     Palpations: Abdomen is soft.     Tenderness: There is no abdominal tenderness.  Musculoskeletal:        General: No swelling.     Cervical back: Normal range of motion and neck supple.     Right lower leg: Edema present.     Left lower leg: Edema present.     Comments: Right great toe large hematoma over toe but no surrounding erythema or smell. Debrided 2/3 of hematoma with pink tissue underneath.   Skin:    General: Skin is warm and dry.  Neurological:     Mental Status: He is alert and oriented to person, place, and time.     BP (!) 101/43 (BP Location: Right Arm, Patient Position: Sitting, Cuff Size: Large)   Pulse 74   Temp 98.6 F (37 C) (Oral)   Resp 14   Ht 6\' 2"  (1.88 m)   SpO2 90%   BMI 51.36 kg/m  Wt Readings from Last 3 Encounters:  05/08/20 (!) 400 lb (181.4 kg)  04/25/20 (!) 370 lb (167.8 kg)  04/20/20 (!) 372 lb 12.8 oz (169.1 kg)    Diabetic Foot Exam - Simple   No data filed     Lab Results  Component Value Date   WBC 9.3 04/20/2020   HGB 13.2 04/20/2020     HCT 40.9 04/20/2020   PLT 162 04/20/2020   GLUCOSE 124 (H) 04/20/2020   CHOL 116 03/10/2020   TRIG 169.0 (H) 03/10/2020   HDL 29.10 (L) 03/10/2020   LDLDIRECT 85.0 07/28/2017   LDLCALC 53 03/10/2020   ALT 25 04/15/2020   AST 23 04/15/2020   NA 137 04/20/2020   K 3.8 04/20/2020   CL 92 (L) 04/20/2020   CREATININE 1.23 04/20/2020   BUN 36 (H) 04/20/2020   CO2 35 (H) 04/20/2020   TSH 1.40 03/10/2020   PSA 24.63 (H) 03/19/2019   INR 1.32 06/10/2015   HGBA1C 5.7 03/10/2020    Lab Results  Component Value Date   TSH 1.40 03/10/2020   Lab Results  Component Value Date   WBC 9.3 04/20/2020   HGB  13.2 04/20/2020   HCT 40.9 04/20/2020   MCV 97.8 04/20/2020   PLT 162 04/20/2020   Lab Results  Component Value Date   NA 137 04/20/2020   K 3.8 04/20/2020   CO2 35 (H) 04/20/2020   GLUCOSE 124 (H) 04/20/2020   BUN 36 (H) 04/20/2020   CREATININE 1.23 04/20/2020   BILITOT 0.6 04/15/2020   ALKPHOS 58 04/15/2020   AST 23 04/15/2020   ALT 25 04/15/2020   PROT 6.4 (L) 04/15/2020   ALBUMIN 3.3 (L) 04/15/2020   CALCIUM 8.7 (L) 04/20/2020   ANIONGAP 10 04/20/2020   GFR 64.13 03/10/2020   Lab Results  Component Value Date   CHOL 116 03/10/2020   Lab Results  Component Value Date   HDL 29.10 (L) 03/10/2020   Lab Results  Component Value Date   LDLCALC 53 03/10/2020   Lab Results  Component Value Date   TRIG 169.0 (H) 03/10/2020   Lab Results  Component Value Date   CHOLHDL 4 03/10/2020   Lab Results  Component Value Date   HGBA1C 5.7 03/10/2020       Assessment & Plan:   Problem List Items Addressed This Visit    Aortic valve disorder    Continues to follow with cardiology and is s/p TAVR      Hyperglycemia    hgba1c acceptable, minimize simple carbs. Increase activity as tolerated      Essential hypertension    Well controlled, no changes to meds. Encouraged heart healthy diet such as the DASH diet and exercise as tolerated.       Renal  insufficiency    Hydrate and monitor      Injury of toe on right foot - Primary    Patient with hematoma on right great toe. Xray confirms soft tissue swelling butn o sign of fracture or infection. Large congealed hematoma debrided with sterile scalpel and qtips. Tolerated well. No bleeding noted. Good pink tissue under hematoma. Started on antibiotics and return in one week for re evaluation or sooner or as needed. Continue to wash daily with mild soap and water. Keep wrapped and dry.       Relevant Orders   DG Toe Great Right (Completed)      I am having Jahree D. Edrington start on cephALEXin. I am also having him maintain his sennosides-docusate sodium, acetaminophen, CoQ10, polyethylene glycol, Misc Natural Products (TART CHERRY ADVANCED PO), vitamin C, Multiple Vitamins-Minerals (CENTRUM SILVER PO), Vitamin D3, CRANBERRY PO, Phenazopyridine HCl (AZO-STANDARD PO), atorvastatin, Eliquis, Hematinic/Folic Acid, furosemide, gabapentin, levocetirizine, montelukast, albuterol, fluticasone, Spiriva HandiHaler, Breztri Aerosphere, and allopurinol.  Meds ordered this encounter  Medications  . cephALEXin (KEFLEX) 500 MG capsule    Sig: Take 1 capsule (500 mg total) by mouth 4 (four) times daily.    Dispense:  40 capsule    Refill:  0

## 2020-05-20 NOTE — Telephone Encounter (Signed)
Patient is taking... tiotropium (SPIRIVA HANDIHALER) 18 MCG inhalation  Per insurance it is recommended that he take Incruse instead. Please call back to advise

## 2020-05-20 NOTE — Assessment & Plan Note (Signed)
Well controlled, no changes to meds. Encouraged heart healthy diet such as the DASH diet and exercise as tolerated.  °

## 2020-05-20 NOTE — Assessment & Plan Note (Signed)
hgba1c acceptable, minimize simple carbs. Increase activity as tolerated

## 2020-05-21 NOTE — Telephone Encounter (Signed)
Yes I do think since he is working with pulmonology they should take the lead on this medication if possible

## 2020-05-22 ENCOUNTER — Ambulatory Visit (INDEPENDENT_AMBULATORY_CARE_PROVIDER_SITE_OTHER): Payer: Medicare HMO | Admitting: Family Medicine

## 2020-05-22 ENCOUNTER — Other Ambulatory Visit: Payer: Self-pay

## 2020-05-22 VITALS — BP 106/70 | HR 68

## 2020-05-22 DIAGNOSIS — S99921A Unspecified injury of right foot, initial encounter: Secondary | ICD-10-CM | POA: Diagnosis not present

## 2020-05-22 DIAGNOSIS — G4733 Obstructive sleep apnea (adult) (pediatric): Secondary | ICD-10-CM | POA: Diagnosis not present

## 2020-05-22 DIAGNOSIS — E7800 Pure hypercholesterolemia, unspecified: Secondary | ICD-10-CM

## 2020-05-22 DIAGNOSIS — N1831 Chronic kidney disease, stage 3a: Secondary | ICD-10-CM | POA: Diagnosis not present

## 2020-05-22 DIAGNOSIS — R739 Hyperglycemia, unspecified: Secondary | ICD-10-CM | POA: Diagnosis not present

## 2020-05-22 DIAGNOSIS — G629 Polyneuropathy, unspecified: Secondary | ICD-10-CM | POA: Diagnosis not present

## 2020-05-22 DIAGNOSIS — R062 Wheezing: Secondary | ICD-10-CM | POA: Diagnosis not present

## 2020-05-22 DIAGNOSIS — M791 Myalgia, unspecified site: Secondary | ICD-10-CM

## 2020-05-22 DIAGNOSIS — I10 Essential (primary) hypertension: Secondary | ICD-10-CM

## 2020-05-22 DIAGNOSIS — I5032 Chronic diastolic (congestive) heart failure: Secondary | ICD-10-CM | POA: Diagnosis not present

## 2020-05-22 DIAGNOSIS — I4819 Other persistent atrial fibrillation: Secondary | ICD-10-CM | POA: Diagnosis not present

## 2020-05-22 DIAGNOSIS — S91101D Unspecified open wound of right great toe without damage to nail, subsequent encounter: Secondary | ICD-10-CM | POA: Diagnosis not present

## 2020-05-22 DIAGNOSIS — R609 Edema, unspecified: Secondary | ICD-10-CM | POA: Diagnosis not present

## 2020-05-22 DIAGNOSIS — J9612 Chronic respiratory failure with hypercapnia: Secondary | ICD-10-CM | POA: Diagnosis not present

## 2020-05-22 DIAGNOSIS — J441 Chronic obstructive pulmonary disease with (acute) exacerbation: Secondary | ICD-10-CM | POA: Diagnosis not present

## 2020-05-22 DIAGNOSIS — J9622 Acute and chronic respiratory failure with hypercapnia: Secondary | ICD-10-CM | POA: Diagnosis not present

## 2020-05-22 DIAGNOSIS — N289 Disorder of kidney and ureter, unspecified: Secondary | ICD-10-CM | POA: Diagnosis not present

## 2020-05-22 DIAGNOSIS — D649 Anemia, unspecified: Secondary | ICD-10-CM | POA: Diagnosis not present

## 2020-05-22 DIAGNOSIS — J449 Chronic obstructive pulmonary disease, unspecified: Secondary | ICD-10-CM | POA: Diagnosis not present

## 2020-05-22 DIAGNOSIS — I4510 Unspecified right bundle-branch block: Secondary | ICD-10-CM | POA: Diagnosis not present

## 2020-05-22 DIAGNOSIS — J9611 Chronic respiratory failure with hypoxia: Secondary | ICD-10-CM | POA: Diagnosis not present

## 2020-05-22 DIAGNOSIS — I130 Hypertensive heart and chronic kidney disease with heart failure and stage 1 through stage 4 chronic kidney disease, or unspecified chronic kidney disease: Secondary | ICD-10-CM | POA: Diagnosis not present

## 2020-05-22 NOTE — Patient Instructions (Signed)

## 2020-05-22 NOTE — Telephone Encounter (Signed)
LMOM informing [name] to contact Pt's pulmonologist. Number to their office provided.

## 2020-05-23 ENCOUNTER — Telehealth: Payer: Self-pay | Admitting: Internal Medicine

## 2020-05-23 LAB — COMPREHENSIVE METABOLIC PANEL
AG Ratio: 1.5 (calc) (ref 1.0–2.5)
ALT: 14 U/L (ref 9–46)
AST: 17 U/L (ref 10–35)
Albumin: 3.4 g/dL — ABNORMAL LOW (ref 3.6–5.1)
Alkaline phosphatase (APISO): 73 U/L (ref 35–144)
BUN: 20 mg/dL (ref 7–25)
CO2: 32 mmol/L (ref 20–32)
Calcium: 9.4 mg/dL (ref 8.6–10.3)
Chloride: 98 mmol/L (ref 98–110)
Creat: 1.05 mg/dL (ref 0.70–1.18)
Globulin: 2.2 g/dL (calc) (ref 1.9–3.7)
Glucose, Bld: 90 mg/dL (ref 65–99)
Potassium: 4.6 mmol/L (ref 3.5–5.3)
Sodium: 141 mmol/L (ref 135–146)
Total Bilirubin: 0.4 mg/dL (ref 0.2–1.2)
Total Protein: 5.6 g/dL — ABNORMAL LOW (ref 6.1–8.1)

## 2020-05-23 LAB — CBC WITH DIFFERENTIAL/PLATELET
Absolute Monocytes: 600 cells/uL (ref 200–950)
Basophils Absolute: 53 cells/uL (ref 0–200)
Basophils Relative: 0.7 %
Eosinophils Absolute: 180 cells/uL (ref 15–500)
Eosinophils Relative: 2.4 %
HCT: 30.5 % — ABNORMAL LOW (ref 38.5–50.0)
Hemoglobin: 9.9 g/dL — ABNORMAL LOW (ref 13.2–17.1)
Lymphs Abs: 1193 cells/uL (ref 850–3900)
MCH: 32.1 pg (ref 27.0–33.0)
MCHC: 32.5 g/dL (ref 32.0–36.0)
MCV: 99 fL (ref 80.0–100.0)
MPV: 11 fL (ref 7.5–12.5)
Monocytes Relative: 8 %
Neutro Abs: 5475 cells/uL (ref 1500–7800)
Neutrophils Relative %: 73 %
Platelets: 178 10*3/uL (ref 140–400)
RBC: 3.08 10*6/uL — ABNORMAL LOW (ref 4.20–5.80)
RDW: 15.4 % — ABNORMAL HIGH (ref 11.0–15.0)
Total Lymphocyte: 15.9 %
WBC: 7.5 10*3/uL (ref 3.8–10.8)

## 2020-05-23 LAB — MAGNESIUM: Magnesium: 2 mg/dL (ref 1.5–2.5)

## 2020-05-23 NOTE — Telephone Encounter (Signed)
LMTCB for [name] at [employer]

## 2020-05-26 DIAGNOSIS — I4819 Other persistent atrial fibrillation: Secondary | ICD-10-CM | POA: Diagnosis not present

## 2020-05-26 DIAGNOSIS — I4510 Unspecified right bundle-branch block: Secondary | ICD-10-CM | POA: Diagnosis not present

## 2020-05-26 DIAGNOSIS — J9611 Chronic respiratory failure with hypoxia: Secondary | ICD-10-CM | POA: Diagnosis not present

## 2020-05-26 DIAGNOSIS — I5032 Chronic diastolic (congestive) heart failure: Secondary | ICD-10-CM | POA: Diagnosis not present

## 2020-05-26 DIAGNOSIS — S91101D Unspecified open wound of right great toe without damage to nail, subsequent encounter: Secondary | ICD-10-CM | POA: Diagnosis not present

## 2020-05-26 DIAGNOSIS — I130 Hypertensive heart and chronic kidney disease with heart failure and stage 1 through stage 4 chronic kidney disease, or unspecified chronic kidney disease: Secondary | ICD-10-CM | POA: Diagnosis not present

## 2020-05-26 DIAGNOSIS — G629 Polyneuropathy, unspecified: Secondary | ICD-10-CM | POA: Diagnosis not present

## 2020-05-26 DIAGNOSIS — N1831 Chronic kidney disease, stage 3a: Secondary | ICD-10-CM | POA: Diagnosis not present

## 2020-05-26 DIAGNOSIS — J441 Chronic obstructive pulmonary disease with (acute) exacerbation: Secondary | ICD-10-CM | POA: Diagnosis not present

## 2020-05-26 DIAGNOSIS — J9612 Chronic respiratory failure with hypercapnia: Secondary | ICD-10-CM | POA: Diagnosis not present

## 2020-05-26 NOTE — Assessment & Plan Note (Signed)
Well controlled, no changes to meds. Encouraged heart healthy diet such as the DASH diet and exercise as tolerated.  °

## 2020-05-26 NOTE — Assessment & Plan Note (Signed)
Encouraged heart healthy diet, increase exercise, avoid trans fats, consider a krill oil cap daily. Tolerating Atorvasatin

## 2020-05-26 NOTE — Assessment & Plan Note (Signed)
hgba1c acceptable, minimize simple carbs. Increase exercise as tolerated.  

## 2020-05-26 NOTE — Assessment & Plan Note (Addendum)
Toe is unwrapped and cleaned with h2o2 and debrided with a sterile blade. Minimal bleeding and the patient tolerated well. He has bright pink tissue underneath. Lesion is covered antibiotic ointment and rewrapped will require more debridement and evaluation next week. Spent 25 minutes working with patient and managing wound care. He will finish his course of antibiotics.

## 2020-05-26 NOTE — Progress Notes (Signed)
Subjective:    Patient ID: [name], male    DOB: [birth year], 79 y.o.   MRN: [id number]  Chief Complaint  Patient presents with   Wound Check    ight foot follow up    HPI Patient is in today for wound check and debridement. He has been tolerating his antibiotic and has not had any pain, bleeding or fevers. They have been keeping it clean and covered with antibiotic ointment and sterile dressing. Denies CP/palp/HA/congestion/fevers/GI or GU c/o. Taking meds as prescribed  Past Medical History:  Diagnosis Date   1st degree AV block 11/09/2018   Noted on EKG    Anemia    Aortic stenosis, severe    S/p Edwards Sapien 3 Transcatheter Heart Valve (size 26 mm, model # [id number], serial # [id number])   Arthritis    Back pain    Bursitis    Cataract    left immature   Cellulitis 10/12/2015   CHF (congestive heart failure) (HCC)    Complication of anesthesia    Halucinations   Constipation    takes Miralax daily as well as Senokot daily   DDD (degenerative disc disease)    Gout    takes Allopurinol daily   Heart valve disorder    History of blood clots 1962   knee   History of blood transfusion    no abnormal reaction noted   History of shingles    Hyperlipidemia    takes Atorvastatin daily   Hypertension    takes Lisinopril daily   Joint pain    Low back pain 01/25/2017   Morbid obesity ([place])    Neck pain on left side 01/25/2017   OSA (obstructive sleep apnea)    Osteoarthritis    Peripheral edema    takes Furosemide daily   Peripheral neuropathy    takes Gabapentin daily   Peroneal palsy    significant right foot drop   Persistent atrial fibrillation (HCC)    Pneumonia 25+yrs ago   hx of   Prostate cancer ([place])    Prostate cancer ([place]) 02/13/2019   RBBB 11/09/2018   Noted on EKG   Shortness of breath    Swelling of extremity    Thrombocytopenia (HCC)    Urinary frequency    Urinary urgency    Valvular heart  disease    Venous stasis dermatitis     Past Surgical History:  Procedure Laterality Date   CARDIAC CATHETERIZATION N/A 05/02/2015   Procedure: Right/Left Heart Cath and Coronary Angiography;  Surgeon: Burnell Blanks, MD;  Location: [place] [facility];  Service: Cardiovascular;  Laterality: N/A;   CARDIOVERSION N/A 10/27/2018   Procedure: CARDIOVERSION;  Surgeon: Fay Records, MD;  Location: [facility] ENDOSCOPY;  Service: Cardiovascular;  Laterality: N/A;   COLONOSCOPY N/A 02/07/2013   Procedure: COLONOSCOPY;  Surgeon: Juanita Craver, MD;  Location: [facility] ENDOSCOPY;  Service: Endoscopy;  Laterality: N/A;   COLONOSCOPY N/A 02/09/2013   Procedure: COLONOSCOPY;  Surgeon: Beryle Beams, MD;  Location: [place];  Service: Endoscopy;  Laterality: N/A;   ESOPHAGOGASTRODUODENOSCOPY N/A 02/07/2013   Procedure: ESOPHAGOGASTRODUODENOSCOPY (EGD);  Surgeon: Juanita Craver, MD;  Location: [facility] ENDOSCOPY;  Service: Endoscopy;  Laterality: N/A;   GIVENS CAPSULE STUDY N/A 02/09/2013   Procedure: GIVENS CAPSULE STUDY;  Surgeon: Beryle Beams, MD;  Location: [place];  Service: Endoscopy;  Laterality: N/A;   HERNIA REPAIR     umbilical hernia   KNEE SURGERY Right    multiple knee surgeries  due to complication of R TKA   LAMINOTOMY  1193   c6-t2   PACEMAKER IMPLANT N/A 04/27/2018   Procedure: PACEMAKER IMPLANT;  Surgeon: Constance Haw, MD;  Location: [place] [facility];  Service: Cardiovascular;  Laterality: N/A;   PROSTATE BIOPSY N/A 04/12/2019   Procedure: BIOPSY TRANSRECTAL ULTRASONIC PROSTATE (TUBP) CYSTOSCOPY;  Surgeon: [name] Hughs, MD;  Location: [facility];  Service: Urology;  Laterality: N/A;   SPINE SURGERY     TEE WITHOUT CARDIOVERSION N/A 03/07/2015   Procedure: TRANSESOPHAGEAL ECHOCARDIOGRAM (TEE);  Surgeon: Josue Hector, MD;  Location: [facility] ENDOSCOPY;  Service: Cardiovascular;  Laterality: N/A;  ANES TO BRING PROPOFOL PER DOCTOR   TEE WITHOUT CARDIOVERSION N/A 06/10/2015    Procedure: TRANSESOPHAGEAL ECHOCARDIOGRAM (TEE);  Surgeon: Burnell Blanks, MD;  Location: [place];  Service: Open Heart Surgery;  Laterality: N/A;   TOTAL KNEE ARTHROPLASTY     right   x4 left x 1    TRANSCATHETER AORTIC VALVE REPLACEMENT, TRANSFEMORAL Left 06/10/2015   Procedure: TRANSCATHETER AORTIC VALVE REPLACEMENT, TRANSFEMORAL;  Surgeon: Burnell Blanks, MD;  Location: [place];  Service: Open Heart Surgery;  Laterality: Left;    Family History  Problem Relation Age of Onset   Other Father        POSSIBLE HEART ATTACK   Arthritis Sister        "crippling"   Prostate cancer Brother    Arthritis Sister    Breast cancer Neg Hx    Colon cancer Neg Hx     Social History   Socioeconomic History   Marital status: Married    Spouse name: [name]   Number of children: 3   Years of education: Not on file   Highest education level: Not on file  Occupational History   Occupation: retired Administrator  Tobacco Use   Smoking status: Former Smoker    Years: 30.00    Types: Pipe    Quit date: 09/20/2006    Years since quitting: 13.6   Smokeless tobacco: Never Used  Scientific laboratory technician Use: Never used  Substance and Sexual Activity   Alcohol use: No    Alcohol/week: 0.0 standard drinks   Drug use: No   Sexual activity: Not Currently    Comment: lives with wife, retired, no dietary restrictions  Other Topics Concern   Not on file  Social History Narrative   Not on file   Social Determinants of Health   Financial Resource Strain: Low Risk    Difficulty of Paying Living Expenses: Not hard at all  Food Insecurity: No Food Insecurity   Worried About Charity fundraiser in the Last Year: Never true   Arboriculturist in the Last Year: Never true  Transportation Needs: No Transportation Needs   Lack of Transportation (Medical): No   Lack of Transportation (Non-Medical): No  Physical Activity:    Days of Exercise per Week: Not on file    Minutes of Exercise per Session: Not on file  Stress:    Feeling of Stress : Not on file  Social Connections:    Frequency of Communication with Friends and Family: Not on file   Frequency of Social Gatherings with Friends and Family: Not on file   Attends Religious Services: Not on file   Active Member of Clubs or Organizations: Not on file   Attends Archivist Meetings: Not on file   Marital Status: Not on file  Intimate Partner Violence:  Fear of Current or Ex-Partner: Not on file   Emotionally Abused: Not on file   Physically Abused: Not on file   Sexually Abused: Not on file    Outpatient Medications Prior to Visit  Medication Sig Dispense Refill   acetaminophen (TYLENOL) 500 MG tablet Take 1,000 mg by mouth 2 (two) times daily.      albuterol (VENTOLIN HFA) 108 (90 Base) MCG/ACT inhaler Inhale 1-2 puffs into the lungs every 6 (six) hours as needed for wheezing or shortness of breath.     allopurinol (ZYLOPRIM) 100 MG tablet Take 1 tablet (100 mg total) by mouth daily. 90 tablet 3   atorvastatin (LIPITOR) 40 MG tablet Take 1 and a half tablet daily by mouth  (60mg ) (Patient taking differently: Take 60 mg by mouth daily. ) 135 tablet 5   Budeson-Glycopyrrol-Formoterol (BREZTRI AEROSPHERE) 160-9-4.8 MCG/ACT AERO Inhale 2 puffs into the lungs 2 (two) times daily. 4.8 g 0   cephALEXin (KEFLEX) 500 MG capsule Take 1 capsule (500 mg total) by mouth 4 (four) times daily. 40 capsule 0   Cholecalciferol (VITAMIN D3) 125 MCG (5000 UT) CAPS Take 5,000 Units by mouth daily.     Coenzyme Q10 (COQ10) 100 MG CAPS Take 300 mg by mouth daily.      CRANBERRY PO Take 1 capsule by mouth 2 (two) times a day.     ELIQUIS 5 MG TABS tablet TAKE 1 TABLET BY MOUTH TWICE A DAY (Patient taking differently: Take 5 mg by mouth 2 (two) times daily. ) 60 tablet 6   fluticasone (FLOVENT HFA) 110 MCG/ACT inhaler Inhale 2 puffs into the lungs 2 (two) times daily. 1 Inhaler 0    furosemide (LASIX) 20 MG tablet TAKE 1 TABLET BY MOUTH TWICE A DAY (Patient taking differently: Take 20 mg by mouth 2 (two) times daily. ) 180 tablet 2   gabapentin (NEURONTIN) 300 MG capsule 1 cap po bid and 2 caps po qhs (Patient taking differently: Take 300 mg by mouth 3 (three) times daily. ) 120 capsule 2   HEMATINIC/FOLIC ACID 099-8 MG TABS TAKE 1 TABLET BY MOUTH EVERY DAY (Patient taking differently: Take 1 tablet by mouth daily. ) 90 tablet 1   levocetirizine (XYZAL) 5 MG tablet Take 1 tablet (5 mg total) by mouth every evening. 90 tablet 3   Misc Natural Products (TART CHERRY ADVANCED PO) Take 1,200 mg by mouth 2 (two) times a day.      montelukast (SINGULAIR) 10 MG tablet Take 1 tablet (10 mg total) by mouth at bedtime. 90 tablet 3   Multiple Vitamins-Minerals (CENTRUM SILVER PO) Take 1 tablet by mouth daily.     Phenazopyridine HCl (AZO-STANDARD PO) Take by mouth.     polyethylene glycol (MIRALAX / GLYCOLAX) packet Take 17 g by mouth daily.     sennosides-docusate sodium (SENOKOT-S) 8.6-50 MG tablet Take 1 tablet by mouth daily.      tiotropium (SPIRIVA HANDIHALER) 18 MCG inhalation capsule Place 1 capsule (18 mcg total) into inhaler and inhale daily. 30 capsule 2   vitamin C (ASCORBIC ACID) 500 MG tablet Take 500 mg by mouth 2 (two) times daily.      No facility-administered medications prior to visit.    Allergies  Allergen Reactions   Coumadin [Warfarin Sodium] Other (See Comments)    States he can't be on this-bleeds out    Review of Systems  Constitutional: Negative for fever and malaise/fatigue.  HENT: Negative for congestion.   Eyes: Negative for blurred  vision.  Respiratory: Negative for shortness of breath.   Cardiovascular: Negative for chest pain, palpitations and leg swelling.  Gastrointestinal: Negative for abdominal pain, blood in stool and nausea.  Genitourinary: Negative for dysuria and frequency.  Musculoskeletal: Negative for falls.  Skin:  Negative for rash.  Neurological: Negative for dizziness, loss of consciousness and headaches.  Endo/Heme/Allergies: Negative for environmental allergies.  Psychiatric/Behavioral: Negative for depression. The patient is not nervous/anxious.        Objective:    Physical Exam Vitals and nursing note reviewed.  Constitutional:      General: He is not in acute distress.    Appearance: He is well-developed.  HENT:     Head: Normocephalic and atraumatic.     Nose: Nose normal.  Eyes:     General:        Right eye: No discharge.        Left eye: No discharge.  Cardiovascular:     Rate and Rhythm: Normal rate and regular rhythm.  Pulmonary:     Effort: Pulmonary effort is normal.     Breath sounds: Normal breath sounds.  Abdominal:     General: Bowel sounds are normal.     Palpations: Abdomen is soft.     Tenderness: There is no abdominal tenderness.  Musculoskeletal:     Cervical back: Normal range of motion and neck supple.     Right lower leg: Edema present.     Left lower leg: Edema present.     Comments: Right great toe with some dried hematoma over medical aspect. Debrides easily with sterile blade. minimal bleeding amd surrounding tissue is not fluctuant  Skin:    General: Skin is warm and dry.  Neurological:     Mental Status: He is alert and oriented to person, place, and time.     BP 106/70 (BP Location: Right Arm, Patient Position: Sitting, Cuff Size: Large)    Pulse 68    SpO2 90%  Wt Readings from Last 3 Encounters:  05/08/20 (!) 400 lb (181.4 kg)  04/25/20 (!) 370 lb (167.8 kg)  04/20/20 (!) 372 lb 12.8 oz (169.1 kg)    Diabetic Foot Exam - Simple   No data filed     Lab Results  Component Value Date   WBC 7.5 05/22/2020   HGB 9.9 (L) 05/22/2020   HCT 30.5 (L) 05/22/2020   PLT 178 05/22/2020   GLUCOSE 90 05/22/2020   CHOL 116 03/10/2020   TRIG 169.0 (H) 03/10/2020   HDL 29.10 (L) 03/10/2020   LDLDIRECT 85.0 07/28/2017   LDLCALC 53 03/10/2020    ALT 14 05/22/2020   AST 17 05/22/2020   NA 141 05/22/2020   K 4.6 05/22/2020   CL 98 05/22/2020   CREATININE 1.05 05/22/2020   BUN 20 05/22/2020   CO2 32 05/22/2020   TSH 1.40 03/10/2020   PSA 24.63 (H) 03/19/2019   INR 1.32 06/10/2015   HGBA1C 5.7 03/10/2020    Lab Results  Component Value Date   TSH 1.40 03/10/2020   Lab Results  Component Value Date   WBC 7.5 05/22/2020   HGB 9.9 (L) 05/22/2020   HCT 30.5 (L) 05/22/2020   MCV 99.0 05/22/2020   PLT 178 05/22/2020   Lab Results  Component Value Date   NA 141 05/22/2020   K 4.6 05/22/2020   CO2 32 05/22/2020   GLUCOSE 90 05/22/2020   BUN 20 05/22/2020   CREATININE 1.05 05/22/2020   BILITOT 0.4 05/22/2020  ALKPHOS 58 04/15/2020   AST 17 05/22/2020   ALT 14 05/22/2020   PROT 5.6 (L) 05/22/2020   ALBUMIN 3.3 (L) 04/15/2020   CALCIUM 9.4 05/22/2020   ANIONGAP 10 04/20/2020   GFR 64.13 03/10/2020   Lab Results  Component Value Date   CHOL 116 03/10/2020   Lab Results  Component Value Date   HDL 29.10 (L) 03/10/2020   Lab Results  Component Value Date   LDLCALC 53 03/10/2020   Lab Results  Component Value Date   TRIG 169.0 (H) 03/10/2020   Lab Results  Component Value Date   CHOLHDL 4 03/10/2020   Lab Results  Component Value Date   HGBA1C 5.7 03/10/2020       Assessment & Plan:   Problem List Items Addressed This Visit    HYPERCHOLESTEROLEMIA    Encouraged heart healthy diet, increase exercise, avoid trans fats, consider a krill oil cap daily. Tolerating Atorvasatin      Anemia - Primary   Relevant Orders   CBC with Differential/Platelet (Completed)   EDEMA   Relevant Orders   Comprehensive metabolic panel (Completed)   Hyperglycemia    hgba1c acceptable, minimize simple carbs. Increase exercise as tolerated.       Essential hypertension    Well controlled, no changes to meds. Encouraged heart healthy diet such as the DASH diet and exercise as tolerated.       Renal insufficiency     Relevant Orders   Comprehensive metabolic panel (Completed)   Injury of toe on right foot    Toe is unwrapped and cleaned with h2o2 and debrided with a sterile blade. Minimal bleeding and the patient tolerated well. He has bright pink tissue underneath. Lesion is covered antibiotic ointment and rewrapped will require more debridement and evaluation next week. Spent 25 minutes working with patient and managing wound care. He will finish his course of antibiotics.        Other Visit Diagnoses    Myalgia       Relevant Orders   Magnesium (Completed)      I am having [facility] maintain his sennosides-docusate sodium, acetaminophen, CoQ10, polyethylene glycol, Misc Natural Products (TART CHERRY ADVANCED PO), vitamin C, Multiple Vitamins-Minerals (CENTRUM SILVER PO), Vitamin D3, CRANBERRY PO, Phenazopyridine HCl (AZO-STANDARD PO), atorvastatin, Eliquis, Hematinic/Folic Acid, furosemide, gabapentin, levocetirizine, montelukast, albuterol, fluticasone, Spiriva HandiHaler, Breztri Aerosphere, allopurinol, and cephALEXin.  No orders of the defined types were placed in this encounter.    Penni Homans, MD

## 2020-05-27 DIAGNOSIS — S91101D Unspecified open wound of right great toe without damage to nail, subsequent encounter: Secondary | ICD-10-CM | POA: Diagnosis not present

## 2020-05-27 DIAGNOSIS — I4819 Other persistent atrial fibrillation: Secondary | ICD-10-CM | POA: Diagnosis not present

## 2020-05-27 DIAGNOSIS — I4510 Unspecified right bundle-branch block: Secondary | ICD-10-CM | POA: Diagnosis not present

## 2020-05-27 DIAGNOSIS — J9612 Chronic respiratory failure with hypercapnia: Secondary | ICD-10-CM | POA: Diagnosis not present

## 2020-05-27 DIAGNOSIS — N1831 Chronic kidney disease, stage 3a: Secondary | ICD-10-CM | POA: Diagnosis not present

## 2020-05-27 DIAGNOSIS — J441 Chronic obstructive pulmonary disease with (acute) exacerbation: Secondary | ICD-10-CM | POA: Diagnosis not present

## 2020-05-27 DIAGNOSIS — J9611 Chronic respiratory failure with hypoxia: Secondary | ICD-10-CM | POA: Diagnosis not present

## 2020-05-27 DIAGNOSIS — G629 Polyneuropathy, unspecified: Secondary | ICD-10-CM | POA: Diagnosis not present

## 2020-05-27 DIAGNOSIS — I5032 Chronic diastolic (congestive) heart failure: Secondary | ICD-10-CM | POA: Diagnosis not present

## 2020-05-27 DIAGNOSIS — I130 Hypertensive heart and chronic kidney disease with heart failure and stage 1 through stage 4 chronic kidney disease, or unspecified chronic kidney disease: Secondary | ICD-10-CM | POA: Diagnosis not present

## 2020-05-27 MED ORDER — BREZTRI AEROSPHERE 160-9-4.8 MCG/ACT IN AERO: 2.0000 | INHALATION_SPRAY | Freq: Two times a day (BID) | RESPIRATORY_TRACT | 5 refills | Status: DC

## 2020-05-27 NOTE — Telephone Encounter (Signed)
Called and spoke with pt who stated he liked the [place] that was given to him as a sample at last [place]. Pt would like to have Rx sent to pharmacy. Verified preferred pharmacy and sent Rx to pharmacy for pt. Nothing furhter needed.

## 2020-05-27 NOTE — Telephone Encounter (Signed)
Pt states he needs Breztri refilled.  [facility].  Please advise.

## 2020-05-29 ENCOUNTER — Ambulatory Visit (INDEPENDENT_AMBULATORY_CARE_PROVIDER_SITE_OTHER): Payer: Medicare HMO | Admitting: Family Medicine

## 2020-05-29 ENCOUNTER — Other Ambulatory Visit: Payer: Self-pay

## 2020-05-29 VITALS — BP 110/72 | HR 59 | Temp 98.3°F | Resp 13 | Ht 74.0 in

## 2020-05-29 DIAGNOSIS — R739 Hyperglycemia, unspecified: Secondary | ICD-10-CM

## 2020-05-29 DIAGNOSIS — D509 Iron deficiency anemia, unspecified: Secondary | ICD-10-CM

## 2020-05-29 DIAGNOSIS — I10 Essential (primary) hypertension: Secondary | ICD-10-CM | POA: Diagnosis not present

## 2020-05-29 DIAGNOSIS — S99921A Unspecified injury of right foot, initial encounter: Secondary | ICD-10-CM

## 2020-05-29 DIAGNOSIS — E8809 Other disorders of plasma-protein metabolism, not elsewhere classified: Secondary | ICD-10-CM | POA: Diagnosis not present

## 2020-05-29 LAB — CBC WITH DIFFERENTIAL/PLATELET
Absolute Monocytes: 525 cells/uL (ref 200–950)
Basophils Absolute: 43 cells/uL (ref 0–200)
Basophils Relative: 0.6 %
Eosinophils Absolute: 170 cells/uL (ref 15–500)
Eosinophils Relative: 2.4 %
HCT: 32.5 % — ABNORMAL LOW (ref 38.5–50.0)
Hemoglobin: 10.6 g/dL — ABNORMAL LOW (ref 13.2–17.1)
Lymphs Abs: 1122 cells/uL (ref 850–3900)
MCH: 32.1 pg (ref 27.0–33.0)
MCHC: 32.6 g/dL (ref 32.0–36.0)
MCV: 98.5 fL (ref 80.0–100.0)
MPV: 10.8 fL (ref 7.5–12.5)
Monocytes Relative: 7.4 %
Neutro Abs: 5240 cells/uL (ref 1500–7800)
Neutrophils Relative %: 73.8 %
Platelets: 172 10*3/uL (ref 140–400)
RBC: 3.3 10*6/uL — ABNORMAL LOW (ref 4.20–5.80)
RDW: 15.5 % — ABNORMAL HIGH (ref 11.0–15.0)
Total Lymphocyte: 15.8 %
WBC: 7.1 10*3/uL (ref 3.8–10.8)

## 2020-05-29 LAB — COMPREHENSIVE METABOLIC PANEL
AG Ratio: 1.7 (calc) (ref 1.0–2.5)
ALT: 17 U/L (ref 9–46)
AST: 21 U/L (ref 10–35)
Albumin: 3.3 g/dL — ABNORMAL LOW (ref 3.6–5.1)
Alkaline phosphatase (APISO): 72 U/L (ref 35–144)
BUN/Creatinine Ratio: 24 (calc) — ABNORMAL HIGH (ref 6–22)
BUN: 26 mg/dL — ABNORMAL HIGH (ref 7–25)
CO2: 34 mmol/L — ABNORMAL HIGH (ref 20–32)
Calcium: 9.2 mg/dL (ref 8.6–10.3)
Chloride: 100 mmol/L (ref 98–110)
Creat: 1.08 mg/dL (ref 0.70–1.18)
Globulin: 2 g/dL (calc) (ref 1.9–3.7)
Glucose, Bld: 97 mg/dL (ref 65–99)
Potassium: 4.4 mmol/L (ref 3.5–5.3)
Sodium: 140 mmol/L (ref 135–146)
Total Bilirubin: 0.4 mg/dL (ref 0.2–1.2)
Total Protein: 5.3 g/dL — ABNORMAL LOW (ref 6.1–8.1)

## 2020-05-29 LAB — IRON,TIBC AND FERRITIN PANEL
%SAT: 24 % (calc) (ref 20–48)
Ferritin: 48 ng/mL (ref 24–380)
Iron: 66 ug/dL (ref 50–180)
TIBC: 270 mcg/dL (calc) (ref 250–425)

## 2020-05-29 NOTE — Patient Instructions (Signed)
Anemia  Anemia is a condition in which you do not have enough red blood cells or hemoglobin. Hemoglobin is a substance in red blood cells that carries oxygen. When you do not have enough red blood cells or hemoglobin (are anemic), your body cannot get enough oxygen and your organs may not work properly. As a result, you may feel very tired or have other problems. What are the causes? Common causes of anemia include:  Excessive bleeding. Anemia can be caused by excessive bleeding inside or outside the body, including bleeding from the intestine or from periods in women.  Poor nutrition.  Long-lasting (chronic) kidney, thyroid, and liver disease.  Bone marrow disorders.  Cancer and treatments for cancer.  HIV (human immunodeficiency virus) and AIDS (acquired immunodeficiency syndrome).  Treatments for HIV and AIDS.  Spleen problems.  Blood disorders.  Infections, medicines, and autoimmune disorders that destroy red blood cells. What are the signs or symptoms? Symptoms of this condition include:  Minor weakness.  Dizziness.  Headache.  Feeling heartbeats that are irregular or faster than normal (palpitations).  Shortness of breath, especially with exercise.  Paleness.  Cold sensitivity.  Indigestion.  Nausea.  Difficulty sleeping.  Difficulty concentrating. Symptoms may occur suddenly or develop slowly. If your anemia is mild, you may not have symptoms. How is this diagnosed? This condition is diagnosed based on:  Blood tests.  Your medical history.  A physical exam.  Bone marrow biopsy. Your health care provider may also check your stool (feces) for blood and may do additional testing to look for the cause of your bleeding. You may also have other tests, including:  Imaging tests, such as a CT scan or MRI.  Endoscopy.  Colonoscopy. How is this treated? Treatment for this condition depends on the cause. If you continue to lose a lot of blood, you may  need to be treated at a hospital. Treatment may include:  Taking supplements of iron, vitamin S31, or folic acid.  Taking a hormone medicine (erythropoietin) that can help to stimulate red blood cell growth.  Having a blood transfusion. This may be needed if you lose a lot of blood.  Making changes to your diet.  Having surgery to remove your spleen. Follow these instructions at home:  Take over-the-counter and prescription medicines only as told by your health care provider.  Take supplements only as told by your health care provider.  Follow any diet instructions that you were given.  Keep all follow-up visits as told by your health care provider. This is important. Contact a health care provider if:  You develop new bleeding anywhere in the body. Get help right away if:  You are very weak.  You are short of breath.  You have pain in your abdomen or chest.  You are dizzy or feel faint.  You have trouble concentrating.  You have bloody or black, tarry stools.  You vomit repeatedly or you vomit up blood. Summary  Anemia is a condition in which you do not have enough red blood cells or enough of a substance in your red blood cells that carries oxygen (hemoglobin).  Symptoms may occur suddenly or develop slowly.  If your anemia is mild, you may not have symptoms.  This condition is diagnosed with blood tests as well as a medical history and physical exam. Other tests may be needed.  Treatment for this condition depends on the cause of the anemia. This information is not intended to replace advice given to you by  your health care provider. Make sure you discuss any questions you have with your health care provider. Document Revised: 08/19/2017 Document Reviewed: 10/08/2016 Elsevier Patient Education  2020 Elsevier Inc.  

## 2020-05-30 ENCOUNTER — Telehealth: Payer: Self-pay | Admitting: Internal Medicine

## 2020-05-30 DIAGNOSIS — Z23 Encounter for immunization: Secondary | ICD-10-CM | POA: Diagnosis not present

## 2020-05-30 MED ORDER — TRELEGY ELLIPTA 100-62.5-25 MCG/INH IN AEPB: 1.0000 | INHALATION_SPRAY | Freq: Every day | RESPIRATORY_TRACT | 12 refills | Status: AC

## 2020-05-30 NOTE — Telephone Encounter (Signed)
Called and spoke with patient and he stated that he was out of Trelegy and needed the inhaler and had to pay $604. Informed patient that we could run some test claims to see which option is best/affordable and maybe do patient assistance paperwork and we would get back to him once we had more information. Patient expressed understanding. Will forward to Rachael for options

## 2020-05-30 NOTE — Telephone Encounter (Signed)
Ok to [employer] [employer] [employer] Rx for Trelegy 100, # 60, inhale 1 puff then rinse mouth, once daily, ref x 12

## 2020-05-30 NOTE — Telephone Encounter (Signed)
Called and spoke with pt letting him know that insurance was rejecting Breztri inhaler that preferred was Trelegy and he verbalized understanding. Stated to him that we were going to send Rx for Trelegy to pharmacy for him. Verified preferred pharmacy and sent Rx to pharmacy for pt. Nothing further needed.

## 2020-05-30 NOTE — Progress Notes (Signed)
Left message on machine for call back regarding  labs.

## 2020-05-30 NOTE — Telephone Encounter (Signed)
Dr. Annamaria Boots, please advise on this. Rx for [name] has been rejected by insurance and preferred is Trelegy.  Allergies  Allergen Reactions  . Coumadin [Warfarin Sodium] Other (See Comments)    States he can't be on this-bleeds out     Current Outpatient Medications:  .  acetaminophen (TYLENOL) 500 MG tablet, Take 1,000 mg by mouth 2 (two) times daily. , Disp: , Rfl:  .  albuterol (VENTOLIN HFA) 108 (90 Base) MCG/ACT inhaler, Inhale 1-2 puffs into the lungs every 6 (six) hours as needed for wheezing or shortness of breath., Disp: , Rfl:  .  allopurinol (ZYLOPRIM) 100 MG tablet, Take 1 tablet (100 mg total) by mouth daily., Disp: 90 tablet, Rfl: 3 .  atorvastatin (LIPITOR) 40 MG tablet, Take 1 and a half tablet daily by mouth  (60mg ) (Patient taking differently: Take 60 mg by mouth daily. ), Disp: 135 tablet, Rfl: 5 .  Budeson-Glycopyrrol-Formoterol (BREZTRI AEROSPHERE) 160-9-4.8 MCG/ACT AERO, Inhale 2 puffs into the lungs 2 (two) times daily., Disp: 10.7 g, Rfl: 5 .  cephALEXin (KEFLEX) 500 MG capsule, Take 1 capsule (500 mg total) by mouth 4 (four) times daily., Disp: 40 capsule, Rfl: 0 .  Cholecalciferol (VITAMIN D3) 125 MCG (5000 UT) CAPS, Take 5,000 Units by mouth daily., Disp: , Rfl:  .  Coenzyme Q10 (COQ10) 100 MG CAPS, Take 300 mg by mouth daily. , Disp: , Rfl:  .  CRANBERRY PO, Take 1 capsule by mouth 2 (two) times a day., Disp: , Rfl:  .  ELIQUIS 5 MG TABS tablet, TAKE 1 TABLET BY MOUTH TWICE A DAY (Patient taking differently: Take 5 mg by mouth 2 (two) times daily. ), Disp: 60 tablet, Rfl: 6 .  fluticasone (FLOVENT HFA) 110 MCG/ACT inhaler, Inhale 2 puffs into the lungs 2 (two) times daily., Disp: 1 Inhaler, Rfl: 0 .  furosemide (LASIX) 20 MG tablet, TAKE 1 TABLET BY MOUTH TWICE A DAY (Patient taking differently: Take 20 mg by mouth 2 (two) times daily. ), Disp: 180 tablet, Rfl: 2 .  gabapentin (NEURONTIN) 300 MG capsule, 1 cap po bid and 2 caps po qhs (Patient taking differently: Take  300 mg by mouth 3 (three) times daily. ), Disp: 120 capsule, Rfl: 2 .  HEMATINIC/FOLIC ACID 379-0 MG TABS, TAKE 1 TABLET BY MOUTH EVERY DAY (Patient taking differently: Take 1 tablet by mouth daily. ), Disp: 90 tablet, Rfl: 1 .  levocetirizine (XYZAL) 5 MG tablet, Take 1 tablet (5 mg total) by mouth every evening., Disp: 90 tablet, Rfl: 3 .  Misc Natural Products (TART CHERRY ADVANCED PO), Take 1,200 mg by mouth 2 (two) times a day. , Disp: , Rfl:  .  montelukast (SINGULAIR) 10 MG tablet, Take 1 tablet (10 mg total) by mouth at bedtime., Disp: 90 tablet, Rfl: 3 .  Multiple Vitamins-Minerals (CENTRUM SILVER PO), Take 1 tablet by mouth daily., Disp: , Rfl:  .  Phenazopyridine HCl (AZO-STANDARD PO), Take by mouth., Disp: , Rfl:  .  polyethylene glycol (MIRALAX / GLYCOLAX) packet, Take 17 g by mouth daily., Disp: , Rfl:  .  sennosides-docusate sodium (SENOKOT-S) 8.6-50 MG tablet, Take 1 tablet by mouth daily. , Disp: , Rfl:  .  vitamin C (ASCORBIC ACID) 500 MG tablet, Take 500 mg by mouth 2 (two) times daily. , Disp: , Rfl:

## 2020-05-30 NOTE — Progress Notes (Signed)
Unable to leave message, pt. Hung up the phone.

## 2020-05-31 NOTE — Telephone Encounter (Signed)
We had received a call from [place] on 9/10 that stated that Trelegy was the preferred inhaler in its class as the Breztri inhaler that had originally been prescribed was being rejected by insurance.  Will await a response from [place].

## 2020-06-02 DIAGNOSIS — E8809 Other disorders of plasma-protein metabolism, not elsewhere classified: Secondary | ICD-10-CM | POA: Insufficient documentation

## 2020-06-02 NOTE — Progress Notes (Signed)
Patient ID: [name], male   DOB: 05-13-1941, 79 y.o.   MRN: [id number]   Subjective:    Patient ID: [name], male    DOB: [birth year], 79 y.o.   MRN: [id number]  Chief Complaint  Patient presents with  . followup right foot-room 22    HPI Patient is in today for follow up on right toe injury and chronic medical concerns. No new or acute concerns. His toe has been healing well. No pain or discharge or bleeding. No fevers or chills. Denies CP/palp/HA/congestion/fevers/GI or GU c/o. Taking meds as prescribed  Past Medical History:  Diagnosis Date  . 1st degree AV block 11/09/2018   Noted on EKG   . Anemia   . Aortic stenosis, severe    S/p Edwards Sapien 3 Transcatheter Heart Valve (size 26 mm, model # [id number], serial # [id number])  . Arthritis   . Back pain   . Bursitis   . Cataract    left immature  . Cellulitis 10/12/2015  . CHF (congestive heart failure) ([place])   . Complication of anesthesia    Halucinations  . Constipation    takes Miralax daily as well as Senokot daily  . DDD (degenerative disc disease)   . Gout    takes Allopurinol daily  . Heart valve disorder   . History of blood clots 1962   knee  . History of blood transfusion    no abnormal reaction noted  . History of shingles   . Hyperlipidemia    takes Atorvastatin daily  . Hypertension    takes Lisinopril daily  . Joint pain   . Low back pain 01/25/2017  . Morbid obesity ([place])   . Neck pain on left side 01/25/2017  . OSA (obstructive sleep apnea)   . Osteoarthritis   . Peripheral edema    takes Furosemide daily  . Peripheral neuropathy    takes Gabapentin daily  . Peroneal palsy    significant right foot drop  . Persistent atrial fibrillation ([place])   . Pneumonia 25+yrs ago   hx of  . Prostate cancer ([place])   . Prostate cancer ([place]) 02/13/2019  . RBBB 11/09/2018   Noted on EKG  . Shortness of breath   . Swelling of extremity   . Thrombocytopenia ([place])   . Urinary frequency   .  Urinary urgency   . Valvular heart disease   . Venous stasis dermatitis     Past Surgical History:  Procedure Laterality Date  . CARDIAC CATHETERIZATION N/A 05/02/2015   Procedure: Right/Left Heart Cath and Coronary Angiography;  Surgeon: Burnell Blanks, MD;  Location: [place] [facility];  Service: Cardiovascular;  Laterality: N/A;  . CARDIOVERSION N/A 10/27/2018   Procedure: CARDIOVERSION;  Surgeon: Fay Records, MD;  Location[facility] ENDOSCOPY;  Service: Cardiovascular;  Laterality: N/A;  . COLONOSCOPY N/A 02/07/2013   Procedure: COLONOSCOPY;  Surgeon: Juanita Craver, MD;  Location: [facility] ENDOSCOPY;  Service: Endoscopy;  Laterality: N/A;  . COLONOSCOPY N/A 02/09/2013   Procedure: COLONOSCOPY;  Surgeon: Beryle Beams, MD;  Location: [place];  Service: Endoscopy;  Laterality: N/A;  . ESOPHAGOGASTRODUODENOSCOPY N/A 02/07/2013   Procedure: ESOPHAGOGASTRODUODENOSCOPY (EGD);  Surgeon: Juanita Craver, MD;  Location: [facility] ENDOSCOPY;  Service: Endoscopy;  Laterality: N/A;  . GIVENS CAPSULE STUDY N/A 02/09/2013   Procedure: GIVENS CAPSULE STUDY;  Surgeon: Beryle Beams, MD;  Location: [place];  Service: Endoscopy;  Laterality: N/A;  . HERNIA REPAIR     umbilical hernia  .  KNEE SURGERY Right    multiple knee surgeries due to complication of R TKA  . LAMINOTOMY  3662   c6-t2  . PACEMAKER IMPLANT N/A 04/27/2018   Procedure: PACEMAKER IMPLANT;  Surgeon: Constance Haw, MD;  Location: [place] [facility];  Service: Cardiovascular;  Laterality: N/A;  . PROSTATE BIOPSY N/A 04/12/2019   Procedure: BIOPSY TRANSRECTAL ULTRASONIC PROSTATE (TUBP) CYSTOSCOPY;  Surgeon: [name] Hughs, MD;  Location: [facility] ORS;  Service: Urology;  Laterality: N/A;  . SPINE SURGERY    . TEE WITHOUT CARDIOVERSION N/A 03/07/2015   Procedure: TRANSESOPHAGEAL ECHOCARDIOGRAM (TEE);  Surgeon: Josue Hector, MD;  Location: [facility] ENDOSCOPY;  Service: Cardiovascular;  Laterality: N/A;  ANES TO BRING PROPOFOL PER DOCTOR  . TEE  WITHOUT CARDIOVERSION N/A 06/10/2015   Procedure: TRANSESOPHAGEAL ECHOCARDIOGRAM (TEE);  Surgeon: Burnell Blanks, MD;  Location: [place];  Service: Open Heart Surgery;  Laterality: N/A;  . TOTAL KNEE ARTHROPLASTY     right   x4 left x 1   . TRANSCATHETER AORTIC VALVE REPLACEMENT, TRANSFEMORAL Left 06/10/2015   Procedure: TRANSCATHETER AORTIC VALVE REPLACEMENT, TRANSFEMORAL;  Surgeon: Burnell Blanks, MD;  Location: [place];  Service: Open Heart Surgery;  Laterality: Left;    Family History  Problem Relation Age of Onset  . Other Father        POSSIBLE HEART ATTACK  . Arthritis Sister        "crippling"  . Prostate cancer Brother   . Arthritis Sister   . Breast cancer Neg Hx   . Colon cancer Neg Hx     Social History   Socioeconomic History  . Marital status: Married    Spouse name: [name]  . Number of children: 3  . Years of education: Not on file  . Highest education level: Not on file  Occupational History  . Occupation: retired Nurse, mental health  . Smoking status: Former Smoker    Years: 30.00    Types: Pipe    Quit date: 09/20/2006    Years since quitting: 13.7  . Smokeless tobacco: Never Used  Vaping Use  . Vaping Use: Never used  Substance and Sexual Activity  . Alcohol use: No    Alcohol/week: 0.0 standard drinks  . Drug use: No  . Sexual activity: Not Currently    Comment: lives with wife, retired, no dietary restrictions  Other Topics Concern  . Not on file  Social History Narrative  . Not on file   Social Determinants of Health   Financial Resource Strain: Low Risk   . Difficulty of Paying Living Expenses: Not hard at all  Food Insecurity: No Food Insecurity  . Worried About Charity fundraiser in the Last Year: Never true  . Ran Out of Food in the Last Year: Never true  Transportation Needs: No Transportation Needs  . Lack of Transportation (Medical): No  . Lack of Transportation (Non-Medical): No  Physical Activity:   . Days of  Exercise per Week: Not on file  . Minutes of Exercise per Session: Not on file  Stress:   . Feeling of Stress : Not on file  Social Connections:   . Frequency of Communication with Friends and Family: Not on file  . Frequency of Social Gatherings with Friends and Family: Not on file  . Attends Religious Services: Not on file  . Active Member of Clubs or Organizations: Not on file  . Attends Archivist Meetings: Not on file  .  Marital Status: Not on file  Intimate Partner Violence:   . Fear of Current or Ex-Partner: Not on file  . Emotionally Abused: Not on file  . Physically Abused: Not on file  . Sexually Abused: Not on file    Outpatient Medications Prior to Visit  Medication Sig Dispense Refill  . acetaminophen (TYLENOL) 500 MG tablet Take 1,000 mg by mouth 2 (two) times daily.     [name] albuterol (VENTOLIN HFA) 108 (90 Base) MCG/ACT inhaler Inhale 1-2 puffs into the lungs every 6 (six) hours as needed for wheezing or shortness of breath.    . allopurinol (ZYLOPRIM) 100 MG tablet Take 1 tablet (100 mg total) by mouth daily. 90 tablet 3  . atorvastatin (LIPITOR) 40 MG tablet Take 1 and a half tablet daily by mouth  (60mg ) (Patient taking differently: Take 60 mg by mouth daily. ) 135 tablet 5  . cephALEXin (KEFLEX) 500 MG capsule Take 1 capsule (500 mg total) by mouth 4 (four) times daily. 40 capsule 0  . Cholecalciferol (VITAMIN D3) 125 MCG (5000 UT) CAPS Take 5,000 Units by mouth daily.    . Coenzyme Q10 (COQ10) 100 MG CAPS Take 300 mg by mouth daily.     [name] CRANBERRY PO Take 1 capsule by mouth 2 (two) times a day.    [name] ELIQUIS 5 MG TABS tablet TAKE 1 TABLET BY MOUTH TWICE A DAY (Patient taking differently: Take 5 mg by mouth 2 (two) times daily. ) 60 tablet 6  . fluticasone (FLOVENT HFA) 110 MCG/ACT inhaler Inhale 2 puffs into the lungs 2 (two) times daily. 1 Inhaler 0  . furosemide (LASIX) 20 MG tablet TAKE 1 TABLET BY MOUTH TWICE A DAY (Patient taking differently: Take 20 mg  by mouth 2 (two) times daily. ) 180 tablet 2  . gabapentin (NEURONTIN) 300 MG capsule 1 cap po bid and 2 caps po qhs (Patient taking differently: Take 300 mg by mouth 3 (three) times daily. ) 120 capsule 2  . HEMATINIC/FOLIC ACID 628-3 MG TABS TAKE 1 TABLET BY MOUTH EVERY DAY (Patient taking differently: Take 1 tablet by mouth daily. ) 90 tablet 1  . levocetirizine (XYZAL) 5 MG tablet Take 1 tablet (5 mg total) by mouth every evening. 90 tablet 3  . Misc Natural Products (TART CHERRY ADVANCED PO) Take 1,200 mg by mouth 2 (two) times a day.     . montelukast (SINGULAIR) 10 MG tablet Take 1 tablet (10 mg total) by mouth at bedtime. 90 tablet 3  . Multiple Vitamins-Minerals (CENTRUM SILVER PO) Take 1 tablet by mouth daily.    . Phenazopyridine HCl (AZO-STANDARD PO) Take by mouth.    . polyethylene glycol (MIRALAX / GLYCOLAX) packet Take 17 g by mouth daily.    . sennosides-docusate sodium (SENOKOT-S) 8.6-50 MG tablet Take 1 tablet by mouth daily.     . vitamin C (ASCORBIC ACID) 500 MG tablet Take 500 mg by mouth 2 (two) times daily.     . Budeson-Glycopyrrol-Formoterol (BREZTRI AEROSPHERE) 160-9-4.8 MCG/ACT AERO Inhale 2 puffs into the lungs 2 (two) times daily. 10.7 g 5   No facility-administered medications prior to visit.    Allergies  Allergen Reactions  . Coumadin [Warfarin Sodium] Other (See Comments)    States he can't be on this-bleeds out    Review of Systems  Constitutional: Negative for fever and malaise/fatigue.  HENT: Negative for congestion.   Eyes: Negative for blurred vision.  Respiratory: Positive for shortness of breath.   Cardiovascular:  Negative for chest pain, palpitations and leg swelling.  Gastrointestinal: Negative for abdominal pain, blood in stool and nausea.  Genitourinary: Negative for dysuria and frequency.  Musculoskeletal: Positive for back pain. Negative for falls.  Skin: Negative for rash.  Neurological: Negative for dizziness, loss of consciousness and  headaches.  Endo/Heme/Allergies: Negative for environmental allergies.  Psychiatric/Behavioral: Negative for depression. The patient is not nervous/anxious.        Objective:    Physical Exam Vitals and nursing note reviewed.  Constitutional:      General: He is not in acute distress.    Appearance: He is well-developed.  HENT:     Head: Normocephalic and atraumatic.     Nose: Nose normal.  Eyes:     General:        Right eye: No discharge.        Left eye: No discharge.  Cardiovascular:     Rate and Rhythm: Normal rate. Rhythm irregular.     Heart sounds: No murmur heard.   Pulmonary:     Effort: Pulmonary effort is normal.     Breath sounds: Normal breath sounds.  Abdominal:     General: Bowel sounds are normal.     Palpations: Abdomen is soft.     Tenderness: There is no abdominal tenderness.  Musculoskeletal:     Cervical back: Normal range of motion and neck supple.  Skin:    General: Skin is warm and dry.     Comments: Right great toe pink, healing well. Only slight scab left on medial aspect. No surrounding fluctuance or erythema. No drainage.  Neurological:     Mental Status: He is alert and oriented to person, place, and time.     BP 110/72 (BP Location: Left Arm, Patient Position: Sitting, Cuff Size: Large)   Pulse (!) 59   Temp 98.3 F (36.8 C) (Oral)   Resp 13   Ht 6\' 2"  (1.88 m)   SpO2 (!) 89%   BMI 51.36 kg/m  Wt Readings from Last 3 Encounters:  05/08/20 (!) 400 lb (181.4 kg)  04/25/20 (!) 370 lb (167.8 kg)  04/20/20 (!) 372 lb 12.8 oz (169.1 kg)    Diabetic Foot Exam - Simple   No data filed     Lab Results  Component Value Date   WBC 7.1 05/29/2020   HGB 10.6 (L) 05/29/2020   HCT 32.5 (L) 05/29/2020   PLT 172 05/29/2020   GLUCOSE 97 05/29/2020   CHOL 116 03/10/2020   TRIG 169.0 (H) 03/10/2020   HDL 29.10 (L) 03/10/2020   LDLDIRECT 85.0 07/28/2017   LDLCALC 53 03/10/2020   ALT 17 05/29/2020   AST 21 05/29/2020   NA 140  05/29/2020   K 4.4 05/29/2020   CL 100 05/29/2020   CREATININE 1.08 05/29/2020   BUN 26 (H) 05/29/2020   CO2 34 (H) 05/29/2020   TSH 1.40 03/10/2020   PSA 24.63 (H) 03/19/2019   INR 1.32 06/10/2015   HGBA1C 5.7 03/10/2020    Lab Results  Component Value Date   TSH 1.40 03/10/2020   Lab Results  Component Value Date   WBC 7.1 05/29/2020   HGB 10.6 (L) 05/29/2020   HCT 32.5 (L) 05/29/2020   MCV 98.5 05/29/2020   PLT 172 05/29/2020   Lab Results  Component Value Date   NA 140 05/29/2020   K 4.4 05/29/2020   CO2 34 (H) 05/29/2020   GLUCOSE 97 05/29/2020   BUN 26 (H) 05/29/2020  CREATININE 1.08 05/29/2020   BILITOT 0.4 05/29/2020   ALKPHOS 58 04/15/2020   AST 21 05/29/2020   ALT 17 05/29/2020   PROT 5.3 (L) 05/29/2020   ALBUMIN 3.3 (L) 04/15/2020   CALCIUM 9.2 05/29/2020   ANIONGAP 10 04/20/2020   GFR 64.13 03/10/2020   Lab Results  Component Value Date   CHOL 116 03/10/2020   Lab Results  Component Value Date   HDL 29.10 (L) 03/10/2020   Lab Results  Component Value Date   LDLCALC 53 03/10/2020   Lab Results  Component Value Date   TRIG 169.0 (H) 03/10/2020   Lab Results  Component Value Date   CHOLHDL 4 03/10/2020   Lab Results  Component Value Date   HGBA1C 5.7 03/10/2020       Assessment & Plan:   Problem List Items Addressed This Visit    Anemia - Primary    Repeat CBC today shows some improvement, proceed with iFOB and consideration of GI referral if positive. Continue iron supplements.       Relevant Orders   CBC w/Diff (Completed)   Iron, TIBC and Ferritin Panel (Completed)   Fecal occult blood, imunochemical   Hyperglycemia    hgba1c acceptable, minimize simple carbs. Increase exercise as tolerated.       Essential hypertension    Well controlled, no changes to meds. Encouraged heart healthy diet such as the DASH diet and exercise as tolerated.       Injury of toe on right foot    Has tolerated debridement and toe pink and  healthy today. No need for further debridement. Lesion cleaned with hydrogen peroxide and covered with antibiotic ointment and sterile bandaid.       Proteins serum plasma low    Encouraged to increase protein intake in diet and will continue to monitor       Other Visit Diagnoses    Hypocalcemia       Relevant Orders   Comprehensive metabolic panel (Completed)      I am having Burke D. Thorns maintain his sennosides-docusate sodium, acetaminophen, CoQ10, polyethylene glycol, Misc Natural Products (TART CHERRY ADVANCED PO), vitamin C, Multiple Vitamins-Minerals (CENTRUM SILVER PO), Vitamin D3, CRANBERRY PO, Phenazopyridine HCl (AZO-STANDARD PO), atorvastatin, Eliquis, Hematinic/Folic Acid, furosemide, gabapentin, levocetirizine, montelukast, albuterol, fluticasone, allopurinol, and cephALEXin.  No orders of the defined types were placed in this encounter.    Penni Homans, MD

## 2020-06-02 NOTE — Assessment & Plan Note (Signed)
hgba1c acceptable, minimize simple carbs. Increase exercise as tolerated.  

## 2020-06-02 NOTE — Assessment & Plan Note (Signed)
Well controlled, no changes to meds. Encouraged heart healthy diet such as the DASH diet and exercise as tolerated.  °

## 2020-06-02 NOTE — Assessment & Plan Note (Signed)
Encouraged to increase protein intake in diet and will continue to monitor

## 2020-06-02 NOTE — Telephone Encounter (Signed)
Attempted to call pt but unable to reach. Left message for him to return call. °

## 2020-06-02 NOTE — Assessment & Plan Note (Signed)
Has tolerated debridement and toe pink and healthy today. No need for further debridement. Lesion cleaned with hydrogen peroxide and covered with antibiotic ointment and sterile bandaid.

## 2020-06-02 NOTE — Assessment & Plan Note (Signed)
Repeat CBC today shows some improvement, proceed with iFOB and consideration of GI referral if positive. Continue iron supplements.

## 2020-06-02 NOTE — Telephone Encounter (Signed)
Pt returning phone call. Pt can be reached at [phone].

## 2020-06-02 NOTE — Telephone Encounter (Signed)
Pt notified of pharm team response  He would like to apply for assistance for trelegy  Forms printed and mailed to pt

## 2020-06-02 NOTE — Telephone Encounter (Signed)
Patient is currently in the coverage gap and affects the copay for all inhalers. Patient should try applying for patient assistance.   Trelegy is preferred, currently refill too soon.

## 2020-06-03 ENCOUNTER — Other Ambulatory Visit: Payer: Self-pay

## 2020-06-03 ENCOUNTER — Telehealth: Payer: Self-pay

## 2020-06-03 ENCOUNTER — Other Ambulatory Visit (INDEPENDENT_AMBULATORY_CARE_PROVIDER_SITE_OTHER): Payer: Medicare HMO

## 2020-06-03 DIAGNOSIS — D509 Iron deficiency anemia, unspecified: Secondary | ICD-10-CM

## 2020-06-03 LAB — FECAL OCCULT BLOOD, IMMUNOCHEMICAL: Fecal Occult Bld: POSITIVE — AB

## 2020-06-03 MED ORDER — FAMOTIDINE 40 MG PO TABS
40.0000 mg | ORAL_TABLET | Freq: Every day | ORAL | 3 refills | Status: DC
Start: 2020-06-03 — End: 2020-08-11

## 2020-06-03 NOTE — Telephone Encounter (Signed)
Received call from [facility] lab regarding patient's IFOB.  Test was positive. FYI. Please advise on further instructions.

## 2020-06-03 NOTE — Telephone Encounter (Signed)
See lab results for instructions

## 2020-06-04 DIAGNOSIS — S91101D Unspecified open wound of right great toe without damage to nail, subsequent encounter: Secondary | ICD-10-CM | POA: Diagnosis not present

## 2020-06-04 DIAGNOSIS — J9611 Chronic respiratory failure with hypoxia: Secondary | ICD-10-CM | POA: Diagnosis not present

## 2020-06-04 DIAGNOSIS — I5032 Chronic diastolic (congestive) heart failure: Secondary | ICD-10-CM | POA: Diagnosis not present

## 2020-06-04 DIAGNOSIS — J9612 Chronic respiratory failure with hypercapnia: Secondary | ICD-10-CM | POA: Diagnosis not present

## 2020-06-04 DIAGNOSIS — I4510 Unspecified right bundle-branch block: Secondary | ICD-10-CM | POA: Diagnosis not present

## 2020-06-04 DIAGNOSIS — N1831 Chronic kidney disease, stage 3a: Secondary | ICD-10-CM | POA: Diagnosis not present

## 2020-06-04 DIAGNOSIS — I4819 Other persistent atrial fibrillation: Secondary | ICD-10-CM | POA: Diagnosis not present

## 2020-06-04 DIAGNOSIS — J441 Chronic obstructive pulmonary disease with (acute) exacerbation: Secondary | ICD-10-CM | POA: Diagnosis not present

## 2020-06-04 DIAGNOSIS — I130 Hypertensive heart and chronic kidney disease with heart failure and stage 1 through stage 4 chronic kidney disease, or unspecified chronic kidney disease: Secondary | ICD-10-CM | POA: Diagnosis not present

## 2020-06-04 DIAGNOSIS — G629 Polyneuropathy, unspecified: Secondary | ICD-10-CM | POA: Diagnosis not present

## 2020-06-05 DIAGNOSIS — I130 Hypertensive heart and chronic kidney disease with heart failure and stage 1 through stage 4 chronic kidney disease, or unspecified chronic kidney disease: Secondary | ICD-10-CM | POA: Diagnosis not present

## 2020-06-05 DIAGNOSIS — N1831 Chronic kidney disease, stage 3a: Secondary | ICD-10-CM | POA: Diagnosis not present

## 2020-06-05 DIAGNOSIS — S91101D Unspecified open wound of right great toe without damage to nail, subsequent encounter: Secondary | ICD-10-CM | POA: Diagnosis not present

## 2020-06-05 DIAGNOSIS — J9611 Chronic respiratory failure with hypoxia: Secondary | ICD-10-CM | POA: Diagnosis not present

## 2020-06-05 DIAGNOSIS — J441 Chronic obstructive pulmonary disease with (acute) exacerbation: Secondary | ICD-10-CM | POA: Diagnosis not present

## 2020-06-05 DIAGNOSIS — I5032 Chronic diastolic (congestive) heart failure: Secondary | ICD-10-CM | POA: Diagnosis not present

## 2020-06-05 DIAGNOSIS — G629 Polyneuropathy, unspecified: Secondary | ICD-10-CM | POA: Diagnosis not present

## 2020-06-05 DIAGNOSIS — I4510 Unspecified right bundle-branch block: Secondary | ICD-10-CM | POA: Diagnosis not present

## 2020-06-05 DIAGNOSIS — I4819 Other persistent atrial fibrillation: Secondary | ICD-10-CM | POA: Diagnosis not present

## 2020-06-05 DIAGNOSIS — J9612 Chronic respiratory failure with hypercapnia: Secondary | ICD-10-CM | POA: Diagnosis not present

## 2020-06-10 DIAGNOSIS — N1831 Chronic kidney disease, stage 3a: Secondary | ICD-10-CM | POA: Diagnosis not present

## 2020-06-10 DIAGNOSIS — I4819 Other persistent atrial fibrillation: Secondary | ICD-10-CM | POA: Diagnosis not present

## 2020-06-10 DIAGNOSIS — J9611 Chronic respiratory failure with hypoxia: Secondary | ICD-10-CM | POA: Diagnosis not present

## 2020-06-10 DIAGNOSIS — I4510 Unspecified right bundle-branch block: Secondary | ICD-10-CM | POA: Diagnosis not present

## 2020-06-10 DIAGNOSIS — S91101D Unspecified open wound of right great toe without damage to nail, subsequent encounter: Secondary | ICD-10-CM | POA: Diagnosis not present

## 2020-06-10 DIAGNOSIS — G629 Polyneuropathy, unspecified: Secondary | ICD-10-CM | POA: Diagnosis not present

## 2020-06-10 DIAGNOSIS — I130 Hypertensive heart and chronic kidney disease with heart failure and stage 1 through stage 4 chronic kidney disease, or unspecified chronic kidney disease: Secondary | ICD-10-CM | POA: Diagnosis not present

## 2020-06-10 DIAGNOSIS — I5032 Chronic diastolic (congestive) heart failure: Secondary | ICD-10-CM | POA: Diagnosis not present

## 2020-06-10 DIAGNOSIS — J9612 Chronic respiratory failure with hypercapnia: Secondary | ICD-10-CM | POA: Diagnosis not present

## 2020-06-10 DIAGNOSIS — J441 Chronic obstructive pulmonary disease with (acute) exacerbation: Secondary | ICD-10-CM | POA: Diagnosis not present

## 2020-06-12 ENCOUNTER — Ambulatory Visit (INDEPENDENT_AMBULATORY_CARE_PROVIDER_SITE_OTHER): Payer: Medicare HMO | Admitting: Family Medicine

## 2020-06-12 ENCOUNTER — Other Ambulatory Visit: Payer: Self-pay

## 2020-06-12 VITALS — BP 122/58 | HR 61 | Temp 97.7°F | Resp 13 | Ht 74.0 in

## 2020-06-12 DIAGNOSIS — I10 Essential (primary) hypertension: Secondary | ICD-10-CM

## 2020-06-12 DIAGNOSIS — S99921A Unspecified injury of right foot, initial encounter: Secondary | ICD-10-CM | POA: Diagnosis not present

## 2020-06-12 DIAGNOSIS — R739 Hyperglycemia, unspecified: Secondary | ICD-10-CM | POA: Diagnosis not present

## 2020-06-12 DIAGNOSIS — J9611 Chronic respiratory failure with hypoxia: Secondary | ICD-10-CM

## 2020-06-12 DIAGNOSIS — N289 Disorder of kidney and ureter, unspecified: Secondary | ICD-10-CM

## 2020-06-12 DIAGNOSIS — R770 Abnormality of albumin: Secondary | ICD-10-CM | POA: Diagnosis not present

## 2020-06-12 DIAGNOSIS — S99922A Unspecified injury of left foot, initial encounter: Secondary | ICD-10-CM | POA: Diagnosis not present

## 2020-06-12 DIAGNOSIS — D649 Anemia, unspecified: Secondary | ICD-10-CM

## 2020-06-12 NOTE — Patient Instructions (Signed)
Anemia  Anemia is a condition in which you do not have enough red blood cells or hemoglobin. Hemoglobin is a substance in red blood cells that carries oxygen. When you do not have enough red blood cells or hemoglobin (are anemic), your body cannot get enough oxygen and your organs may not work properly. As a result, you may feel very tired or have other problems. What are the causes? Common causes of anemia include:  Excessive bleeding. Anemia can be caused by excessive bleeding inside or outside the body, including bleeding from the intestine or from periods in women.  Poor nutrition.  Long-lasting (chronic) kidney, thyroid, and liver disease.  Bone marrow disorders.  Cancer and treatments for cancer.  HIV (human immunodeficiency virus) and AIDS (acquired immunodeficiency syndrome).  Treatments for HIV and AIDS.  Spleen problems.  Blood disorders.  Infections, medicines, and autoimmune disorders that destroy red blood cells. What are the signs or symptoms? Symptoms of this condition include:  Minor weakness.  Dizziness.  Headache.  Feeling heartbeats that are irregular or faster than normal (palpitations).  Shortness of breath, especially with exercise.  Paleness.  Cold sensitivity.  Indigestion.  Nausea.  Difficulty sleeping.  Difficulty concentrating. Symptoms may occur suddenly or develop slowly. If your anemia is mild, you may not have symptoms. How is this diagnosed? This condition is diagnosed based on:  Blood tests.  Your medical history.  A physical exam.  Bone marrow biopsy. Your health care provider may also check your stool (feces) for blood and may do additional testing to look for the cause of your bleeding. You may also have other tests, including:  Imaging tests, such as a CT scan or MRI.  Endoscopy.  Colonoscopy. How is this treated? Treatment for this condition depends on the cause. If you continue to lose a lot of blood, you may  need to be treated at a hospital. Treatment may include:  Taking supplements of iron, vitamin S31, or folic acid.  Taking a hormone medicine (erythropoietin) that can help to stimulate red blood cell growth.  Having a blood transfusion. This may be needed if you lose a lot of blood.  Making changes to your diet.  Having surgery to remove your spleen. Follow these instructions at home:  Take over-the-counter and prescription medicines only as told by your health care provider.  Take supplements only as told by your health care provider.  Follow any diet instructions that you were given.  Keep all follow-up visits as told by your health care provider. This is important. Contact a health care provider if:  You develop new bleeding anywhere in the body. Get help right away if:  You are very weak.  You are short of breath.  You have pain in your abdomen or chest.  You are dizzy or feel faint.  You have trouble concentrating.  You have bloody or black, tarry stools.  You vomit repeatedly or you vomit up blood. Summary  Anemia is a condition in which you do not have enough red blood cells or enough of a substance in your red blood cells that carries oxygen (hemoglobin).  Symptoms may occur suddenly or develop slowly.  If your anemia is mild, you may not have symptoms.  This condition is diagnosed with blood tests as well as a medical history and physical exam. Other tests may be needed.  Treatment for this condition depends on the cause of the anemia. This information is not intended to replace advice given to you by  your health care provider. Make sure you discuss any questions you have with your health care provider. Document Revised: 08/19/2017 Document Reviewed: 10/08/2016 Elsevier Patient Education  [place].

## 2020-06-13 LAB — COMPREHENSIVE METABOLIC PANEL
AG Ratio: 1.6 (calc) (ref 1.0–2.5)
ALT: 18 U/L (ref 9–46)
AST: 23 U/L (ref 10–35)
Albumin: 3.7 g/dL (ref 3.6–5.1)
Alkaline phosphatase (APISO): 68 U/L (ref 35–144)
BUN/Creatinine Ratio: 23 (calc) — ABNORMAL HIGH (ref 6–22)
BUN: 27 mg/dL — ABNORMAL HIGH (ref 7–25)
CO2: 33 mmol/L — ABNORMAL HIGH (ref 20–32)
Calcium: 9.5 mg/dL (ref 8.6–10.3)
Chloride: 98 mmol/L (ref 98–110)
Creat: 1.19 mg/dL — ABNORMAL HIGH (ref 0.70–1.18)
Globulin: 2.3 g/dL (calc) (ref 1.9–3.7)
Glucose, Bld: 94 mg/dL (ref 65–99)
Potassium: 4.7 mmol/L (ref 3.5–5.3)
Sodium: 141 mmol/L (ref 135–146)
Total Bilirubin: 0.5 mg/dL (ref 0.2–1.2)
Total Protein: 6 g/dL — ABNORMAL LOW (ref 6.1–8.1)

## 2020-06-13 LAB — PREALBUMIN: Prealbumin: 23 mg/dL (ref 21–43)

## 2020-06-13 LAB — CBC
HCT: 34.7 % — ABNORMAL LOW (ref 38.5–50.0)
Hemoglobin: 11.3 g/dL — ABNORMAL LOW (ref 13.2–17.1)
MCH: 32.6 pg (ref 27.0–33.0)
MCHC: 32.6 g/dL (ref 32.0–36.0)
MCV: 100 fL (ref 80.0–100.0)
MPV: 11 fL (ref 7.5–12.5)
Platelets: 147 10*3/uL (ref 140–400)
RBC: 3.47 10*6/uL — ABNORMAL LOW (ref 4.20–5.80)
RDW: 15.7 % — ABNORMAL HIGH (ref 11.0–15.0)
WBC: 6.9 10*3/uL (ref 3.8–10.8)

## 2020-06-14 DIAGNOSIS — G4733 Obstructive sleep apnea (adult) (pediatric): Secondary | ICD-10-CM | POA: Diagnosis not present

## 2020-06-14 DIAGNOSIS — J449 Chronic obstructive pulmonary disease, unspecified: Secondary | ICD-10-CM | POA: Diagnosis not present

## 2020-06-15 DIAGNOSIS — R770 Abnormality of albumin: Secondary | ICD-10-CM | POA: Insufficient documentation

## 2020-06-15 DIAGNOSIS — S99922S Unspecified injury of left foot, sequela: Secondary | ICD-10-CM | POA: Insufficient documentation

## 2020-06-15 NOTE — Assessment & Plan Note (Signed)
Has healed very well. Very small scab still noted along medical aspect of great toe. No surrounding fluctuance or redness.

## 2020-06-15 NOTE — Assessment & Plan Note (Signed)
Improving but still low. Discussed strategies for increasing protein in the diet. Will continue to monitor

## 2020-06-15 NOTE — Assessment & Plan Note (Deleted)
Doing well at home at this time. No recent flare. No changes

## 2020-06-15 NOTE — Assessment & Plan Note (Signed)
hgba1c acceptable, minimize simple carbs. Increase exercise as tolerated.  

## 2020-06-15 NOTE — Assessment & Plan Note (Signed)
Stable, hydrate and monitor 

## 2020-06-15 NOTE — Progress Notes (Signed)
Subjective:    Patient ID: [name], male    DOB: 1941-06-24, 79 y.o.   MRN: [id number]  Chief Complaint  Patient presents with  . followup-Room 22    HPI Patient is in today for follow up on chronic medical concerns and to have his left foot looked at. He notes he injured his left heel at the same time he injurted his right great toe. He believes it is healing well but he would like to have it looked at. No pain or bleeding associated. No fevers or chills or other acute concerns. Denies CP/palp/SOB/HA/congestion/fevers/GI or GU c/o. Taking meds as prescribed  Past Medical History:  Diagnosis Date  . 1st degree AV block 11/09/2018   Noted on EKG   . Anemia   . Aortic stenosis, severe    S/p Edwards Sapien 3 Transcatheter Heart Valve (size 26 mm, model # [id number], serial # [id number])  . Arthritis   . Back pain   . Bursitis   . Cataract    left immature  . Cellulitis 10/12/2015  . CHF (congestive heart failure) ([place])   . Complication of anesthesia    Halucinations  . Constipation    takes Miralax daily as well as Senokot daily  . DDD (degenerative disc disease)   . Gout    takes Allopurinol daily  . Heart valve disorder   . History of blood clots 1962   knee  . History of blood transfusion    no abnormal reaction noted  . History of shingles   . Hyperlipidemia    takes Atorvastatin daily  . Hypertension    takes Lisinopril daily  . Joint pain   . Low back pain 01/25/2017  . Morbid obesity ([place])   . Neck pain on left side 01/25/2017  . OSA (obstructive sleep apnea)   . Osteoarthritis   . Peripheral edema    takes Furosemide daily  . Peripheral neuropathy    takes Gabapentin daily  . Peroneal palsy    significant right foot drop  . Persistent atrial fibrillation ([place])   . Pneumonia 25+yrs ago   hx of  . Prostate cancer ([place])   . Prostate cancer ([place]) 02/13/2019  . RBBB 11/09/2018   Noted on EKG  . Shortness of breath   . Swelling of extremity   .  Thrombocytopenia ([place])   . Urinary frequency   . Urinary urgency   . Valvular heart disease   . Venous stasis dermatitis     Past Surgical History:  Procedure Laterality Date  . CARDIAC CATHETERIZATION N/A 05/02/2015   Procedure: Right/Left Heart Cath and Coronary Angiography;  Surgeon: Burnell Blanks, MD;  Location: [place] [facility];  Service: Cardiovascular;  Laterality: N/A;  . CARDIOVERSION N/A 10/27/2018   Procedure: CARDIOVERSION;  Surgeon: Fay Records, MD;  Location: [facility] ENDOSCOPY;  Service: Cardiovascular;  Laterality: N/A;  . COLONOSCOPY N/A 02/07/2013   Procedure: COLONOSCOPY;  Surgeon: Juanita Craver, MD;  Location: [facility] ENDOSCOPY;  Service: Endoscopy;  Laterality: N/A;  . COLONOSCOPY N/A 02/09/2013   Procedure: COLONOSCOPY;  Surgeon: Beryle Beams, MD;  Location: [place];  Service: Endoscopy;  Laterality: N/A;  . ESOPHAGOGASTRODUODENOSCOPY N/A 02/07/2013   Procedure: ESOPHAGOGASTRODUODENOSCOPY (EGD);  Surgeon: Juanita Craver, MD;  Location: [facility] ENDOSCOPY;  Service: Endoscopy;  Laterality: N/A;  . GIVENS CAPSULE STUDY N/A 02/09/2013   Procedure: GIVENS CAPSULE STUDY;  Surgeon: Beryle Beams, MD;  Location: [place];  Service: Endoscopy;  Laterality: N/A;  .  HERNIA REPAIR     umbilical hernia  . KNEE SURGERY Right    multiple knee surgeries due to complication of R TKA  . LAMINOTOMY  8676   c6-t2  . PACEMAKER IMPLANT N/A 04/27/2018   Procedure: PACEMAKER IMPLANT;  Surgeon: Constance Haw, MD;  Location: [place] [facility];  Service: Cardiovascular;  Laterality: N/A;  . PROSTATE BIOPSY N/A 04/12/2019   Procedure: BIOPSY TRANSRECTAL ULTRASONIC PROSTATE (TUBP) CYSTOSCOPY;  Surgeon: [name] Hughs, MD;  Location: [facility];  Service: Urology;  Laterality: N/A;  . SPINE SURGERY    . TEE WITHOUT CARDIOVERSION N/A 03/07/2015   Procedure: TRANSESOPHAGEAL ECHOCARDIOGRAM (TEE);  Surgeon: Josue Hector, MD;  Location: [facility] ENDOSCOPY;  Service: Cardiovascular;  Laterality:  N/A;  ANES TO BRING PROPOFOL PER DOCTOR  . TEE WITHOUT CARDIOVERSION N/A 06/10/2015   Procedure: TRANSESOPHAGEAL ECHOCARDIOGRAM (TEE);  Surgeon: Burnell Blanks, MD;  Location: [place];  Service: Open Heart Surgery;  Laterality: N/A;  . TOTAL KNEE ARTHROPLASTY     right   x4 left x 1   . TRANSCATHETER AORTIC VALVE REPLACEMENT, TRANSFEMORAL Left 06/10/2015   Procedure: TRANSCATHETER AORTIC VALVE REPLACEMENT, TRANSFEMORAL;  Surgeon: Burnell Blanks, MD;  Location: [place];  Service: Open Heart Surgery;  Laterality: Left;    Family History  Problem Relation Age of Onset  . Other Father        POSSIBLE HEART ATTACK  . Arthritis Sister        "crippling"  . Prostate cancer Brother   . Arthritis Sister   . Breast cancer Neg Hx   . Colon cancer Neg Hx     Social History   Socioeconomic History  . Marital status: Married    Spouse name: [name]  . Number of children: 3  . Years of education: Not on file  . Highest education level: Not on file  Occupational History  . Occupation: retired Nurse, mental health  . Smoking status: Former Smoker    Years: 30.00    Types: Pipe    Quit date: 09/20/2006    Years since quitting: 13.7  . Smokeless tobacco: Never Used  Vaping Use  . Vaping Use: Never used  Substance and Sexual Activity  . Alcohol use: No    Alcohol/week: 0.0 standard drinks  . Drug use: No  . Sexual activity: Not Currently    Comment: lives with wife, retired, no dietary restrictions  Other Topics Concern  . Not on file  Social History Narrative  . Not on file   Social Determinants of Health   Financial Resource Strain: Low Risk   . Difficulty of Paying Living Expenses: Not hard at all  Food Insecurity: No Food Insecurity  . Worried About Charity fundraiser in the Last Year: Never true  . Ran Out of Food in the Last Year: Never true  Transportation Needs: No Transportation Needs  . Lack of Transportation (Medical): No  . Lack of Transportation  (Non-Medical): No  Physical Activity:   . Days of Exercise per Week: Not on file  . Minutes of Exercise per Session: Not on file  Stress:   . Feeling of Stress : Not on file  Social Connections:   . Frequency of Communication with Friends and Family: Not on file  . Frequency of Social Gatherings with Friends and Family: Not on file  . Attends Religious Services: Not on file  . Active Member of Clubs or Organizations: Not on file  .  Attends Archivist Meetings: Not on file  . Marital Status: Not on file  Intimate Partner Violence:   . Fear of Current or Ex-Partner: Not on file  . Emotionally Abused: Not on file  . Physically Abused: Not on file  . Sexually Abused: Not on file    Outpatient Medications Prior to Visit  Medication Sig Dispense Refill  . acetaminophen (TYLENOL) 500 MG tablet Take 1,000 mg by mouth 2 (two) times daily.     [name] albuterol (VENTOLIN HFA) 108 (90 Base) MCG/ACT inhaler Inhale 1-2 puffs into the lungs every 6 (six) hours as needed for wheezing or shortness of breath.    . allopurinol (ZYLOPRIM) 100 MG tablet Take 1 tablet (100 mg total) by mouth daily. 90 tablet 3  . atorvastatin (LIPITOR) 40 MG tablet Take 1 and a half tablet daily by mouth  (60mg ) (Patient taking differently: Take 60 mg by mouth daily. ) 135 tablet 5  . cephALEXin (KEFLEX) 500 MG capsule Take 1 capsule (500 mg total) by mouth 4 (four) times daily. 40 capsule 0  . Cholecalciferol (VITAMIN D3) 125 MCG (5000 UT) CAPS Take 5,000 Units by mouth daily.    . Coenzyme Q10 (COQ10) 100 MG CAPS Take 300 mg by mouth daily.     [name] CRANBERRY PO Take 1 capsule by mouth 2 (two) times a day.    [name] ELIQUIS 5 MG TABS tablet TAKE 1 TABLET BY MOUTH TWICE A DAY (Patient taking differently: Take 5 mg by mouth 2 (two) times daily. ) 60 tablet 6  . famotidine (PEPCID) 40 MG tablet Take 1 tablet (40 mg total) by mouth daily. 30 tablet 3  . fluticasone (FLOVENT HFA) 110 MCG/ACT inhaler Inhale 2 puffs into the lungs  2 (two) times daily. 1 Inhaler 0  . Fluticasone-Umeclidin-Vilant (TRELEGY ELLIPTA) 100-62.5-25 MCG/INH AEPB Inhale 1 puff into the lungs daily. 60 each 12  . furosemide (LASIX) 20 MG tablet TAKE 1 TABLET BY MOUTH TWICE A DAY (Patient taking differently: Take 20 mg by mouth 2 (two) times daily. ) 180 tablet 2  . gabapentin (NEURONTIN) 300 MG capsule 1 cap po bid and 2 caps po qhs (Patient taking differently: Take 300 mg by mouth 3 (three) times daily. ) 120 capsule 2  . HEMATINIC/FOLIC ACID 852-7 MG TABS TAKE 1 TABLET BY MOUTH EVERY DAY (Patient taking differently: Take 1 tablet by mouth daily. ) 90 tablet 1  . levocetirizine (XYZAL) 5 MG tablet Take 1 tablet (5 mg total) by mouth every evening. 90 tablet 3  . Misc Natural Products (TART CHERRY ADVANCED PO) Take 1,200 mg by mouth 2 (two) times a day.     . montelukast (SINGULAIR) 10 MG tablet Take 1 tablet (10 mg total) by mouth at bedtime. 90 tablet 3  . Multiple Vitamins-Minerals (CENTRUM SILVER PO) Take 1 tablet by mouth daily.    . Phenazopyridine HCl (AZO-STANDARD PO) Take by mouth.    . polyethylene glycol (MIRALAX / GLYCOLAX) packet Take 17 g by mouth daily.    . sennosides-docusate sodium (SENOKOT-S) 8.6-50 MG tablet Take 1 tablet by mouth daily.     . vitamin C (ASCORBIC ACID) 500 MG tablet Take 500 mg by mouth 2 (two) times daily.      No facility-administered medications prior to visit.    Allergies  Allergen Reactions  . Coumadin [Warfarin Sodium] Other (See Comments)    States he can't be on this-bleeds out    Review of Systems  Constitutional: Negative  for fever and malaise/fatigue.  HENT: Negative for congestion.   Eyes: Negative for blurred vision.  Respiratory: Negative for shortness of breath.   Cardiovascular: Negative for chest pain, palpitations and leg swelling.  Gastrointestinal: Negative for abdominal pain, blood in stool and nausea.  Genitourinary: Negative for dysuria and frequency.  Musculoskeletal: Negative  for falls.  Skin: Negative for rash.  Neurological: Negative for dizziness, loss of consciousness and headaches.  Endo/Heme/Allergies: Negative for environmental allergies.  Psychiatric/Behavioral: Negative for depression. The patient is not nervous/anxious.        Objective:    Physical Exam Vitals and nursing note reviewed.  Constitutional:      General: He is not in acute distress.    Appearance: He is well-developed.  HENT:     Head: Normocephalic and atraumatic.     Nose: Nose normal.  Eyes:     General:        Right eye: No discharge.        Left eye: No discharge.  Cardiovascular:     Rate and Rhythm: Normal rate. Rhythm irregular.     Heart sounds: No murmur heard.   Pulmonary:     Effort: Pulmonary effort is normal.     Breath sounds: Normal breath sounds.  Abdominal:     General: Bowel sounds are normal.     Palpations: Abdomen is soft.     Tenderness: There is no abdominal tenderness.  Musculoskeletal:     Cervical back: Normal range of motion and neck supple.  Skin:    General: Skin is warm and dry.  Neurological:     Mental Status: He is alert and oriented to person, place, and time.     BP (!) 122/58 (BP Location: Right Arm, Patient Position: Sitting, Cuff Size: Large)   Pulse 61   Temp 97.7 F (36.5 C) (Oral)   Resp 13   Ht 6\' 2"  (1.88 m)   SpO2 96%   BMI 51.36 kg/m  Wt Readings from Last 3 Encounters:  05/08/20 (!) 400 lb (181.4 kg)  04/25/20 (!) 370 lb (167.8 kg)  04/20/20 (!) 372 lb 12.8 oz (169.1 kg)    Diabetic Foot Exam - Simple   No data filed     Lab Results  Component Value Date   WBC 6.9 06/12/2020   HGB 11.3 (L) 06/12/2020   HCT 34.7 (L) 06/12/2020   PLT 147 06/12/2020   GLUCOSE 94 06/12/2020   CHOL 116 03/10/2020   TRIG 169.0 (H) 03/10/2020   HDL 29.10 (L) 03/10/2020   LDLDIRECT 85.0 07/28/2017   LDLCALC 53 03/10/2020   ALT 18 06/12/2020   AST 23 06/12/2020   NA 141 06/12/2020   K 4.7 06/12/2020   CL 98 06/12/2020    CREATININE 1.19 (H) 06/12/2020   BUN 27 (H) 06/12/2020   CO2 33 (H) 06/12/2020   TSH 1.40 03/10/2020   PSA 24.63 (H) 03/19/2019   INR 1.32 06/10/2015   HGBA1C 5.7 03/10/2020    Lab Results  Component Value Date   TSH 1.40 03/10/2020   Lab Results  Component Value Date   WBC 6.9 06/12/2020   HGB 11.3 (L) 06/12/2020   HCT 34.7 (L) 06/12/2020   MCV 100.0 06/12/2020   PLT 147 06/12/2020   Lab Results  Component Value Date   NA 141 06/12/2020   K 4.7 06/12/2020   CO2 33 (H) 06/12/2020   GLUCOSE 94 06/12/2020   BUN 27 (H) 06/12/2020   CREATININE 1.19 (H)  06/12/2020   BILITOT 0.5 06/12/2020   ALKPHOS 58 04/15/2020   AST 23 06/12/2020   ALT 18 06/12/2020   PROT 6.0 (L) 06/12/2020   ALBUMIN 3.3 (L) 04/15/2020   CALCIUM 9.5 06/12/2020   ANIONGAP 10 04/20/2020   GFR 64.13 03/10/2020   Lab Results  Component Value Date   CHOL 116 03/10/2020   Lab Results  Component Value Date   HDL 29.10 (L) 03/10/2020   Lab Results  Component Value Date   LDLCALC 53 03/10/2020   Lab Results  Component Value Date   TRIG 169.0 (H) 03/10/2020   Lab Results  Component Value Date   CHOLHDL 4 03/10/2020   Lab Results  Component Value Date   HGBA1C 5.7 03/10/2020       Assessment & Plan:   Problem List Items Addressed This Visit    Anemia - Primary   Relevant Orders   CBC (Completed)   Hyperglycemia    hgba1c acceptable, minimize simple carbs. Increase exercise as tolerated.       Essential hypertension    Well controlled, no changes to meds. Encouraged heart healthy diet such as the DASH diet and exercise as tolerated.       Renal insufficiency    Stable, hydrate and monitor      Chronic respiratory failure with hypoxia ([place])    Doing well at home at this time. No recent flare. No changes      Injury of toe on right foot    Has healed very well. Very small scab still noted along medical aspect of great toe. No surrounding fluctuance or redness.        Abnormal albumin    Improving but still low. Discussed strategies for increasing protein in the diet. Will continue to monitor      Relevant Orders   Comprehensive metabolic panel (Completed)   Prealbumin (Completed)   Foot injury, left, initial encounter    He reports today he injured his left heel at the same time he injury his right foot but did not mention it. Small amount of scabbed tissue debrided with pink well perfused tissue underneath. Cleaned with hydrogen peroxide but no further follow up needed.          I am having Norvelt D. Rattan maintain his sennosides-docusate sodium, acetaminophen, CoQ10, polyethylene glycol, Misc Natural Products (TART CHERRY ADVANCED PO), vitamin C, Multiple Vitamins-Minerals (CENTRUM SILVER PO), Vitamin D3, CRANBERRY PO, Phenazopyridine HCl (AZO-STANDARD PO), atorvastatin, Eliquis, Hematinic/Folic Acid, furosemide, gabapentin, levocetirizine, montelukast, albuterol, fluticasone, allopurinol, cephALEXin, Trelegy Ellipta, and famotidine.  No orders of the defined types were placed in this encounter.    Penni Homans, MD

## 2020-06-15 NOTE — Assessment & Plan Note (Signed)
Doing well at home at this time. No recent flare. No changes

## 2020-06-15 NOTE — Assessment & Plan Note (Signed)
Well controlled, no changes to meds. Encouraged heart healthy diet such as the DASH diet and exercise as tolerated.  °

## 2020-06-15 NOTE — Assessment & Plan Note (Signed)
He reports today he injured his left heel at the same time he injury his right foot but did not mention it. Small amount of scabbed tissue debrided with pink well perfused tissue underneath. Cleaned with hydrogen peroxide but no further follow up needed.

## 2020-06-17 DIAGNOSIS — R062 Wheezing: Secondary | ICD-10-CM | POA: Diagnosis not present

## 2020-06-17 DIAGNOSIS — G4733 Obstructive sleep apnea (adult) (pediatric): Secondary | ICD-10-CM | POA: Diagnosis not present

## 2020-06-19 DIAGNOSIS — I4510 Unspecified right bundle-branch block: Secondary | ICD-10-CM | POA: Diagnosis not present

## 2020-06-19 DIAGNOSIS — I5032 Chronic diastolic (congestive) heart failure: Secondary | ICD-10-CM | POA: Diagnosis not present

## 2020-06-19 DIAGNOSIS — I130 Hypertensive heart and chronic kidney disease with heart failure and stage 1 through stage 4 chronic kidney disease, or unspecified chronic kidney disease: Secondary | ICD-10-CM | POA: Diagnosis not present

## 2020-06-19 DIAGNOSIS — J9612 Chronic respiratory failure with hypercapnia: Secondary | ICD-10-CM | POA: Diagnosis not present

## 2020-06-19 DIAGNOSIS — S91101D Unspecified open wound of right great toe without damage to nail, subsequent encounter: Secondary | ICD-10-CM | POA: Diagnosis not present

## 2020-06-19 DIAGNOSIS — I4819 Other persistent atrial fibrillation: Secondary | ICD-10-CM | POA: Diagnosis not present

## 2020-06-19 DIAGNOSIS — J9611 Chronic respiratory failure with hypoxia: Secondary | ICD-10-CM | POA: Diagnosis not present

## 2020-06-19 DIAGNOSIS — J441 Chronic obstructive pulmonary disease with (acute) exacerbation: Secondary | ICD-10-CM | POA: Diagnosis not present

## 2020-06-19 DIAGNOSIS — N1831 Chronic kidney disease, stage 3a: Secondary | ICD-10-CM | POA: Diagnosis not present

## 2020-06-19 DIAGNOSIS — G629 Polyneuropathy, unspecified: Secondary | ICD-10-CM | POA: Diagnosis not present

## 2020-06-20 ENCOUNTER — Telehealth: Payer: Self-pay | Admitting: Internal Medicine

## 2020-06-20 NOTE — Telephone Encounter (Signed)
Called and spoke with patient to let him know that unfortunately we do not have any samples to give him right now. Let him know that Dr. Annamaria Boots sent in a years worth of refills for him. He expressed understanding and said that he would call the pharmacy. Nothing further needed at this time.

## 2020-06-21 DIAGNOSIS — R062 Wheezing: Secondary | ICD-10-CM | POA: Diagnosis not present

## 2020-06-21 DIAGNOSIS — J9622 Acute and chronic respiratory failure with hypercapnia: Secondary | ICD-10-CM | POA: Diagnosis not present

## 2020-06-21 DIAGNOSIS — G4733 Obstructive sleep apnea (adult) (pediatric): Secondary | ICD-10-CM | POA: Diagnosis not present

## 2020-06-21 DIAGNOSIS — J449 Chronic obstructive pulmonary disease, unspecified: Secondary | ICD-10-CM | POA: Diagnosis not present

## 2020-06-27 DIAGNOSIS — J441 Chronic obstructive pulmonary disease with (acute) exacerbation: Secondary | ICD-10-CM | POA: Diagnosis not present

## 2020-06-27 DIAGNOSIS — I4819 Other persistent atrial fibrillation: Secondary | ICD-10-CM | POA: Diagnosis not present

## 2020-06-27 DIAGNOSIS — J9612 Chronic respiratory failure with hypercapnia: Secondary | ICD-10-CM | POA: Diagnosis not present

## 2020-06-27 DIAGNOSIS — J9611 Chronic respiratory failure with hypoxia: Secondary | ICD-10-CM | POA: Diagnosis not present

## 2020-06-27 DIAGNOSIS — N1831 Chronic kidney disease, stage 3a: Secondary | ICD-10-CM | POA: Diagnosis not present

## 2020-06-27 DIAGNOSIS — I4510 Unspecified right bundle-branch block: Secondary | ICD-10-CM | POA: Diagnosis not present

## 2020-06-27 DIAGNOSIS — I5032 Chronic diastolic (congestive) heart failure: Secondary | ICD-10-CM | POA: Diagnosis not present

## 2020-06-27 DIAGNOSIS — I130 Hypertensive heart and chronic kidney disease with heart failure and stage 1 through stage 4 chronic kidney disease, or unspecified chronic kidney disease: Secondary | ICD-10-CM | POA: Diagnosis not present

## 2020-06-27 DIAGNOSIS — G629 Polyneuropathy, unspecified: Secondary | ICD-10-CM | POA: Diagnosis not present

## 2020-06-27 DIAGNOSIS — S91101D Unspecified open wound of right great toe without damage to nail, subsequent encounter: Secondary | ICD-10-CM | POA: Diagnosis not present

## 2020-07-01 ENCOUNTER — Encounter: Payer: Self-pay | Admitting: Podiatry

## 2020-07-01 ENCOUNTER — Ambulatory Visit: Payer: Medicare HMO | Admitting: Podiatry

## 2020-07-01 ENCOUNTER — Other Ambulatory Visit: Payer: Self-pay

## 2020-07-01 DIAGNOSIS — M79675 Pain in left toe(s): Secondary | ICD-10-CM

## 2020-07-01 DIAGNOSIS — D689 Coagulation defect, unspecified: Secondary | ICD-10-CM

## 2020-07-01 DIAGNOSIS — M79674 Pain in right toe(s): Secondary | ICD-10-CM | POA: Diagnosis not present

## 2020-07-01 DIAGNOSIS — B351 Tinea unguium: Secondary | ICD-10-CM

## 2020-07-01 DIAGNOSIS — I872 Venous insufficiency (chronic) (peripheral): Secondary | ICD-10-CM

## 2020-07-01 DIAGNOSIS — I739 Peripheral vascular disease, unspecified: Secondary | ICD-10-CM

## 2020-07-03 ENCOUNTER — Telehealth: Payer: Self-pay | Admitting: Family Medicine

## 2020-07-03 DIAGNOSIS — H2513 Age-related nuclear cataract, bilateral: Secondary | ICD-10-CM | POA: Diagnosis not present

## 2020-07-03 NOTE — Progress Notes (Signed)
  Chronic Care Management   Outreach Note  07/03/2020 Name: [name] MRN: [id number] DOB: 1940-10-31  Referred by: [name], MD Reason for referral : No chief complaint on file.   An unsuccessful telephone outreach was attempted today. The patient was referred to the pharmacist for assistance with care management and care coordination.   Follow Up Plan:   Carley Perdue [employer] Scheduler

## 2020-07-04 ENCOUNTER — Telehealth: Payer: Self-pay | Admitting: Family Medicine

## 2020-07-04 NOTE — Progress Notes (Signed)
  Chronic Care Management   Note  07/04/2020 Name: Sundance Moise MRN: [id number] DOB: 1941-07-06  Lemond Griffee is a 79 y.o. year old male who is a primary care patient of [name], MD. I reached out to [name] by phone today in response to a referral sent by Mr. Cyndi Bender Atlas's PCP, [name], MD.   Mr. Frappier was given information about Chronic Care Management services today including:  1. CCM service includes personalized support from designated clinical staff supervised by his physician, including individualized plan of care and coordination with other care providers 2. 24/7 contact phone numbers for assistance for urgent and routine care needs. 3. Service will only be billed when office clinical staff spend 20 minutes or more in a month to coordinate care. 4. Only one practitioner may furnish and bill the service in a calendar month. 5. The patient may stop CCM services at any time (effective at the end of the month) by phone call to the office staff.   Patient agreed to services and verbal consent obtained.   Follow up plan:   Carley Perdue [employer] Scheduler

## 2020-07-05 NOTE — Progress Notes (Signed)
Subjective:  Patient ID: [name], male    DOB: 1941-02-11,  MRN: [id number]  79 y.o. male presents with for at risk foot care. Patient has h/o PVD and painful thick toenails that are difficult to trim. Pain interferes with ambulation. Aggravating factors include wearing enclosed shoe gear. Pain is relieved with periodic professional debridement..    Review of Systems: Negative except as noted in the HPI.  Past Medical History:  Diagnosis Date  . 1st degree AV block 11/09/2018   Noted on EKG   . Anemia   . Aortic stenosis, severe    S/p Edwards Sapien 3 Transcatheter Heart Valve (size 26 mm, model # [id number], serial # [id number])  . Arthritis   . Back pain   . Bursitis   . Cataract    left immature  . Cellulitis 10/12/2015  . CHF (congestive heart failure) ([place])   . Complication of anesthesia    Halucinations  . Constipation    takes Miralax daily as well as Senokot daily  . DDD (degenerative disc disease)   . Gout    takes Allopurinol daily  . Heart valve disorder   . History of blood clots 1962   knee  . History of blood transfusion    no abnormal reaction noted  . History of shingles   . Hyperlipidemia    takes Atorvastatin daily  . Hypertension    takes Lisinopril daily  . Joint pain   . Low back pain 01/25/2017  . Morbid obesity ([place])   . Neck pain on left side 01/25/2017  . OSA (obstructive sleep apnea)   . Osteoarthritis   . Peripheral edema    takes Furosemide daily  . Peripheral neuropathy    takes Gabapentin daily  . Peroneal palsy    significant right foot drop  . Persistent atrial fibrillation ([place])   . Pneumonia 25+yrs ago   hx of  . Prostate cancer ([place])   . Prostate cancer ([place]) 02/13/2019  . RBBB 11/09/2018   Noted on EKG  . Shortness of breath   . Swelling of extremity   . Thrombocytopenia ([place])   . Urinary frequency   . Urinary urgency   . Valvular heart disease   . Venous stasis dermatitis    Past Surgical History:  Procedure  Laterality Date  . CARDIAC CATHETERIZATION N/A 05/02/2015   Procedure: Right/Left Heart Cath and Coronary Angiography;  Surgeon: Burnell Blanks, MD;  Location: [place] [facility];  Service: Cardiovascular;  Laterality: N/A;  . CARDIOVERSION N/A 10/27/2018   Procedure: CARDIOVERSION;  Surgeon: Fay Records, MD;  Location: [facility] ENDOSCOPY;  Service: Cardiovascular;  Laterality: N/A;  . COLONOSCOPY N/A 02/07/2013   Procedure: COLONOSCOPY;  Surgeon: Juanita Craver, MD;  Location: [facility] ENDOSCOPY;  Service: Endoscopy;  Laterality: N/A;  . COLONOSCOPY N/A 02/09/2013   Procedure: COLONOSCOPY;  Surgeon: Beryle Beams, MD;  Location: [place];  Service: Endoscopy;  Laterality: N/A;  . ESOPHAGOGASTRODUODENOSCOPY N/A 02/07/2013   Procedure: ESOPHAGOGASTRODUODENOSCOPY (EGD);  Surgeon: Juanita Craver, MD;  Location: [facility] ENDOSCOPY;  Service: Endoscopy;  Laterality: N/A;  . GIVENS CAPSULE STUDY N/A 02/09/2013   Procedure: GIVENS CAPSULE STUDY;  Surgeon: Beryle Beams, MD;  Location: [place];  Service: Endoscopy;  Laterality: N/A;  . HERNIA REPAIR     umbilical hernia  . KNEE SURGERY Right    multiple knee surgeries due to complication of R TKA  . LAMINOTOMY  0109   c6-t2  . PACEMAKER IMPLANT N/A 04/27/2018  Procedure: PACEMAKER IMPLANT;  Surgeon: Constance Haw, MD;  Location: [place] [facility];  Service: Cardiovascular;  Laterality: N/A;  . PROSTATE BIOPSY N/A 04/12/2019   Procedure: BIOPSY TRANSRECTAL ULTRASONIC PROSTATE (TUBP) CYSTOSCOPY;  Surgeon: [name] Hughs, MD;  Location: [facility];  Service: Urology;  Laterality: N/A;  . SPINE SURGERY    . TEE WITHOUT CARDIOVERSION N/A 03/07/2015   Procedure: TRANSESOPHAGEAL ECHOCARDIOGRAM (TEE);  Surgeon: Josue Hector, MD;  Location: [facility] ENDOSCOPY;  Service: Cardiovascular;  Laterality: N/A;  ANES TO BRING PROPOFOL PER DOCTOR  . TEE WITHOUT CARDIOVERSION N/A 06/10/2015   Procedure: TRANSESOPHAGEAL ECHOCARDIOGRAM (TEE);  Surgeon: Burnell Blanks,  MD;  Location: [place];  Service: Open Heart Surgery;  Laterality: N/A;  . TOTAL KNEE ARTHROPLASTY     right   x4 left x 1   . TRANSCATHETER AORTIC VALVE REPLACEMENT, TRANSFEMORAL Left 06/10/2015   Procedure: TRANSCATHETER AORTIC VALVE REPLACEMENT, TRANSFEMORAL;  Surgeon: Burnell Blanks, MD;  Location: [place];  Service: Open Heart Surgery;  Laterality: Left;   Patient Active Problem List   Diagnosis Date Noted  . Abnormal albumin 06/15/2020  . Foot injury, left, initial encounter 06/15/2020  . Proteins serum plasma low 06/02/2020  . Injury of toe on right foot 05/20/2020  . Chronic respiratory failure with hypoxia ([place]) 02/26/2020  . Coagulation defect ([place]) 12/07/2019  . Malignant neoplasm of prostate ([place]) 05/15/2019  . Pain due to onychomycosis of toenails of both feet 03/13/2019  . PVD (peripheral vascular disease) ([place]) 03/13/2019  . Prostate cancer ([place]) 02/13/2019  . Urinary tract infection 12/16/2018  . Persistent atrial fibrillation ([place]) 10/17/2018  . Primary osteoarthritis of left shoulder 08/23/2018  . Renal insufficiency 06/29/2018  . Bradycardia 04/26/2018  . Other fatigue 12/06/2017  . Shortness of breath on exertion 12/06/2017  . Essential hypertension 12/06/2017  . Hyperglycemia 07/28/2017  . Neck pain on left side 01/25/2017  . Low back pain 01/25/2017  . Constipation 08/15/2015  . Severe aortic valve stenosis   . Medicare annual wellness visit, subsequent 03/09/2015  . Preventative health care 03/09/2015  . Allergic rhinitis 12/02/2014  . COPD mixed type ([place]) 12/02/2014  . Chronic venous insufficiency 10/02/2013  . Venous stasis dermatitis 10/02/2013  . AVM (arteriovenous malformation) of colon with hemorrhage 02/10/2013  . Thrombocytopenia, unspecified ([place]) 02/09/2013  . EDEMA 04/15/2010  . HYPERCHOLESTEROLEMIA 04/14/2010  . Gout 04/14/2010  . Anemia 04/14/2010  . Aortic valve disorder 04/14/2010  . VALVULAR HEART DISEASE 04/14/2010  .  Osteoarthritis 04/14/2010  . Multilevel degenerative disc disease 04/14/2010  . BURSITIS 04/14/2010  . OBESITY, MORBID 03/21/2010  . Obstructive sleep apnea 02/19/2008    Current Outpatient Medications:  .  acetaminophen (TYLENOL) 500 MG tablet, Take 1,000 mg by mouth 2 (two) times daily. , Disp: , Rfl:  .  albuterol (VENTOLIN HFA) 108 (90 Base) MCG/ACT inhaler, Inhale 1-2 puffs into the lungs every 6 (six) hours as needed for wheezing or shortness of breath., Disp: , Rfl:  .  allopurinol (ZYLOPRIM) 100 MG tablet, Take 1 tablet (100 mg total) by mouth daily., Disp: 90 tablet, Rfl: 3 .  atorvastatin (LIPITOR) 40 MG tablet, Take 1 and a half tablet daily by mouth  (60mg ) (Patient taking differently: Take 60 mg by mouth daily. ), Disp: 135 tablet, Rfl: 5 .  cephALEXin (KEFLEX) 500 MG capsule, Take 1 capsule (500 mg total) by mouth 4 (four) times daily., Disp: 40 capsule, Rfl: 0 .  Cholecalciferol (VITAMIN D3) 125 MCG (5000 UT)  CAPS, Take 5,000 Units by mouth daily., Disp: , Rfl:  .  Coenzyme Q10 (COQ10) 100 MG CAPS, Take 300 mg by mouth daily. , Disp: , Rfl:  .  CRANBERRY PO, Take 1 capsule by mouth 2 (two) times a day., Disp: , Rfl:  .  ELIQUIS 5 MG TABS tablet, TAKE 1 TABLET BY MOUTH TWICE A DAY (Patient taking differently: Take 5 mg by mouth 2 (two) times daily. ), Disp: 60 tablet, Rfl: 6 .  famotidine (PEPCID) 40 MG tablet, Take 1 tablet (40 mg total) by mouth daily., Disp: 30 tablet, Rfl: 3 .  fluticasone (FLOVENT HFA) 110 MCG/ACT inhaler, Inhale 2 puffs into the lungs 2 (two) times daily., Disp: 1 Inhaler, Rfl: 0 .  Fluticasone-Umeclidin-Vilant (TRELEGY ELLIPTA) 100-62.5-25 MCG/INH AEPB, Inhale 1 puff into the lungs daily., Disp: 60 each, Rfl: 12 .  furosemide (LASIX) 20 MG tablet, TAKE 1 TABLET BY MOUTH TWICE A DAY (Patient taking differently: Take 20 mg by mouth 2 (two) times daily. ), Disp: 180 tablet, Rfl: 2 .  gabapentin (NEURONTIN) 300 MG capsule, 1 cap po bid and 2 caps po qhs (Patient  taking differently: Take 300 mg by mouth 3 (three) times daily. ), Disp: 120 capsule, Rfl: 2 .  HEMATINIC/FOLIC ACID 096-0 MG TABS, TAKE 1 TABLET BY MOUTH EVERY DAY (Patient taking differently: Take 1 tablet by mouth daily. ), Disp: 90 tablet, Rfl: 1 .  levocetirizine (XYZAL) 5 MG tablet, Take 1 tablet (5 mg total) by mouth every evening., Disp: 90 tablet, Rfl: 3 .  Misc Natural Products (TART CHERRY ADVANCED PO), Take 1,200 mg by mouth 2 (two) times a day. , Disp: , Rfl:  .  montelukast (SINGULAIR) 10 MG tablet, Take 1 tablet (10 mg total) by mouth at bedtime., Disp: 90 tablet, Rfl: 3 .  Multiple Vitamins-Minerals (CENTRUM SILVER PO), Take 1 tablet by mouth daily., Disp: , Rfl:  .  Phenazopyridine HCl (AZO-STANDARD PO), Take by mouth., Disp: , Rfl:  .  polyethylene glycol (MIRALAX / GLYCOLAX) packet, Take 17 g by mouth daily., Disp: , Rfl:  .  sennosides-docusate sodium (SENOKOT-S) 8.6-50 MG tablet, Take 1 tablet by mouth daily. , Disp: , Rfl:  .  vitamin C (ASCORBIC ACID) 500 MG tablet, Take 500 mg by mouth 2 (two) times daily. , Disp: , Rfl:  Allergies  Allergen Reactions  . Coumadin [Warfarin Sodium] Other (See Comments)    States he can't be on this-bleeds out   Social History   Occupational History  . Occupation: retired Nurse, mental health  . Smoking status: Former Smoker    Years: 30.00    Types: Pipe    Quit date: 09/20/2006    Years since quitting: 13.8  . Smokeless tobacco: Never Used  Vaping Use  . Vaping Use: Never used  Substance and Sexual Activity  . Alcohol use: No    Alcohol/week: 0.0 standard drinks  . Drug use: No  . Sexual activity: Not Currently    Comment: lives with wife, retired, no dietary restrictions    Objective:   Constitutional Pt is a pleasant 79 y.o. Caucasian male morbidly obese in NAD. AAO x 3.   Vascular Capillary fill time to digits <3 seconds b/l lower extremities. Nonpalpable DP pulse(s) b/l lower extremities. Nonpalpable PT pulse(s)  b/l lower extremities. Pedal hair absent. Lower extremity skin temperature gradient warm to cool. Purple toes noted b/l. No pain with calf compression b/l. +2 pitting edema b/l lower extremities. Evidence of chronic  venous insufficiency b/l LE.  Neurologic Normal speech. Protective sensation diminished with 10g monofilament b/l.  Dermatologic Pedal skin with normal turgor, texture and tone bilaterally. No open wounds bilaterally. No interdigital macerations bilaterally. Toenails 1-5 b/l elongated, discolored, dystrophic, thickened, crumbly with subungual debris and tenderness to dorsal palpation.  Orthopedic: Normal muscle strength 5/5 to all lower extremity muscle groups bilaterally. No pain crepitus or joint limitation noted with ROM b/l. No gross bony deformities bilaterally. Utilizes motorized chair for mobility assistance.   Radiographs: None Assessment:   1. Pain due to onychomycosis of toenails of both feet   2. Coagulation defect ([place])   3. PVD (peripheral vascular disease) ([place])   4. Chronic venous insufficiency    Plan:  Patient was evaluated and treated and all questions answered.  Onychomycosis with pain -Nails palliatively debridement as below. -Educated on self-care  Procedure: Nail Debridement Rationale: Pain Type of Debridement: manual, sharp debridement. Instrumentation: Nail nipper, rotary burr. Number of Nails: 10  -Examined patient. -Toenails 1-5 b/l were debrided in length and girth with sterile nail nippers and dremel without iatrogenic bleeding.  -Patient to report any pedal injuries to medical professional immediately. -Patient to continue soft, supportive shoe gear daily. -Patient/POA to call should there be question/concern in the interim.  Return in about 3 months (around 10/01/2020).  Marzetta Board, DPM

## 2020-07-08 ENCOUNTER — Encounter: Payer: Self-pay | Admitting: Family Medicine

## 2020-07-08 ENCOUNTER — Ambulatory Visit: Payer: Medicare HMO | Admitting: Family Medicine

## 2020-07-08 ENCOUNTER — Ambulatory Visit (INDEPENDENT_AMBULATORY_CARE_PROVIDER_SITE_OTHER): Payer: Medicare HMO | Admitting: Family Medicine

## 2020-07-08 ENCOUNTER — Other Ambulatory Visit: Payer: Self-pay

## 2020-07-08 ENCOUNTER — Ambulatory Visit: Payer: PPO | Admitting: Internal Medicine

## 2020-07-08 VITALS — BP 134/68 | HR 60 | Temp 97.3°F

## 2020-07-08 DIAGNOSIS — S99922S Unspecified injury of left foot, sequela: Secondary | ICD-10-CM | POA: Diagnosis not present

## 2020-07-08 DIAGNOSIS — I10 Essential (primary) hypertension: Secondary | ICD-10-CM | POA: Diagnosis not present

## 2020-07-08 DIAGNOSIS — M1A9XX0 Chronic gout, unspecified, without tophus (tophi): Secondary | ICD-10-CM

## 2020-07-08 DIAGNOSIS — I359 Nonrheumatic aortic valve disorder, unspecified: Secondary | ICD-10-CM | POA: Diagnosis not present

## 2020-07-08 DIAGNOSIS — R739 Hyperglycemia, unspecified: Secondary | ICD-10-CM

## 2020-07-08 DIAGNOSIS — D649 Anemia, unspecified: Secondary | ICD-10-CM

## 2020-07-08 DIAGNOSIS — E7800 Pure hypercholesterolemia, unspecified: Secondary | ICD-10-CM

## 2020-07-08 MED ORDER — HEMATINIC/FOLIC ACID 324-1 MG PO TABS: 1.0000 | ORAL_TABLET | Freq: Every day | ORAL | 1 refills | Status: DC

## 2020-07-08 NOTE — Patient Instructions (Signed)
Anemia  Anemia is a condition in which you do not have enough red blood cells or hemoglobin. Hemoglobin is a substance in red blood cells that carries oxygen. When you do not have enough red blood cells or hemoglobin (are anemic), your body cannot get enough oxygen and your organs may not work properly. As a result, you may feel very tired or have other problems. What are the causes? Common causes of anemia include:  Excessive bleeding. Anemia can be caused by excessive bleeding inside or outside the body, including bleeding from the intestine or from periods in women.  Poor nutrition.  Long-lasting (chronic) kidney, thyroid, and liver disease.  Bone marrow disorders.  Cancer and treatments for cancer.  HIV (human immunodeficiency virus) and AIDS (acquired immunodeficiency syndrome).  Treatments for HIV and AIDS.  Spleen problems.  Blood disorders.  Infections, medicines, and autoimmune disorders that destroy red blood cells. What are the signs or symptoms? Symptoms of this condition include:  Minor weakness.  Dizziness.  Headache.  Feeling heartbeats that are irregular or faster than normal (palpitations).  Shortness of breath, especially with exercise.  Paleness.  Cold sensitivity.  Indigestion.  Nausea.  Difficulty sleeping.  Difficulty concentrating. Symptoms may occur suddenly or develop slowly. If your anemia is mild, you may not have symptoms. How is this diagnosed? This condition is diagnosed based on:  Blood tests.  Your medical history.  A physical exam.  Bone marrow biopsy. Your health care provider may also check your stool (feces) for blood and may do additional testing to look for the cause of your bleeding. You may also have other tests, including:  Imaging tests, such as a CT scan or MRI.  Endoscopy.  Colonoscopy. How is this treated? Treatment for this condition depends on the cause. If you continue to lose a lot of blood, you may  need to be treated at a hospital. Treatment may include:  Taking supplements of iron, vitamin S31, or folic acid.  Taking a hormone medicine (erythropoietin) that can help to stimulate red blood cell growth.  Having a blood transfusion. This may be needed if you lose a lot of blood.  Making changes to your diet.  Having surgery to remove your spleen. Follow these instructions at home:  Take over-the-counter and prescription medicines only as told by your health care provider.  Take supplements only as told by your health care provider.  Follow any diet instructions that you were given.  Keep all follow-up visits as told by your health care provider. This is important. Contact a health care provider if:  You develop new bleeding anywhere in the body. Get help right away if:  You are very weak.  You are short of breath.  You have pain in your abdomen or chest.  You are dizzy or feel faint.  You have trouble concentrating.  You have bloody or black, tarry stools.  You vomit repeatedly or you vomit up blood. Summary  Anemia is a condition in which you do not have enough red blood cells or enough of a substance in your red blood cells that carries oxygen (hemoglobin).  Symptoms may occur suddenly or develop slowly.  If your anemia is mild, you may not have symptoms.  This condition is diagnosed with blood tests as well as a medical history and physical exam. Other tests may be needed.  Treatment for this condition depends on the cause of the anemia. This information is not intended to replace advice given to you by  your health care provider. Make sure you discuss any questions you have with your health care provider. Document Revised: 08/19/2017 Document Reviewed: 10/08/2016 Elsevier Patient Education  [place].

## 2020-07-09 LAB — COMPREHENSIVE METABOLIC PANEL
AG Ratio: 1.7 (calc) (ref 1.0–2.5)
ALT: 19 U/L (ref 9–46)
AST: 21 U/L (ref 10–35)
Albumin: 3.6 g/dL (ref 3.6–5.1)
Alkaline phosphatase (APISO): 75 U/L (ref 35–144)
BUN/Creatinine Ratio: 24 (calc) — ABNORMAL HIGH (ref 6–22)
BUN: 26 mg/dL — ABNORMAL HIGH (ref 7–25)
CO2: 34 mmol/L — ABNORMAL HIGH (ref 20–32)
Calcium: 9.4 mg/dL (ref 8.6–10.3)
Chloride: 100 mmol/L (ref 98–110)
Creat: 1.1 mg/dL (ref 0.70–1.18)
Globulin: 2.1 g/dL (calc) (ref 1.9–3.7)
Glucose, Bld: 93 mg/dL (ref 65–99)
Potassium: 4.5 mmol/L (ref 3.5–5.3)
Sodium: 141 mmol/L (ref 135–146)
Total Bilirubin: 0.4 mg/dL (ref 0.2–1.2)
Total Protein: 5.7 g/dL — ABNORMAL LOW (ref 6.1–8.1)

## 2020-07-09 LAB — LIPID PANEL
Cholesterol: 119 mg/dL (ref <200)
HDL: 29 mg/dL — ABNORMAL LOW (ref >=40)
LDL Cholesterol (Calc): 69 mg/dL (calc)
Non-HDL Cholesterol (Calc): 90 mg/dL (calc) (ref <130)
Total CHOL/HDL Ratio: 4.1 (calc) (ref <5.0)
Triglycerides: 131 mg/dL (ref <150)

## 2020-07-09 LAB — CBC WITH DIFFERENTIAL/PLATELET
Absolute Monocytes: 449 cells/uL (ref 200–950)
Basophils Absolute: 20 cells/uL (ref 0–200)
Basophils Relative: 0.3 %
Eosinophils Absolute: 79 cells/uL (ref 15–500)
Eosinophils Relative: 1.2 %
HCT: 34.9 % — ABNORMAL LOW (ref 38.5–50.0)
Hemoglobin: 11.1 g/dL — ABNORMAL LOW (ref 13.2–17.1)
Lymphs Abs: 1333 cells/uL (ref 850–3900)
MCH: 31.6 pg (ref 27.0–33.0)
MCHC: 31.8 g/dL — ABNORMAL LOW (ref 32.0–36.0)
MCV: 99.4 fL (ref 80.0–100.0)
MPV: 10.6 fL (ref 7.5–12.5)
Monocytes Relative: 6.8 %
Neutro Abs: 4719 cells/uL (ref 1500–7800)
Neutrophils Relative %: 71.5 %
Platelets: 145 10*3/uL (ref 140–400)
RBC: 3.51 10*6/uL — ABNORMAL LOW (ref 4.20–5.80)
RDW: 14.6 % (ref 11.0–15.0)
Total Lymphocyte: 20.2 %
WBC: 6.6 10*3/uL (ref 3.8–10.8)

## 2020-07-09 LAB — HEMOGLOBIN A1C
Hgb A1c MFr Bld: 5.2 % of total Hgb (ref <5.7)
Mean Plasma Glucose: 103 (calc)
eAG (mmol/L): 5.7 (calc)

## 2020-07-09 LAB — TSH: TSH: 1.29 mIU/L (ref 0.40–4.50)

## 2020-07-09 NOTE — Assessment & Plan Note (Signed)
He reports his feet have healed well he has not concerns

## 2020-07-09 NOTE — Assessment & Plan Note (Signed)
Well controlled, no changes to meds. Encouraged heart healthy diet such as the DASH diet and exercise as tolerated.  °

## 2020-07-09 NOTE — Assessment & Plan Note (Signed)
Encouraged heart healthy diet, increase exercise, avoid trans fats, consider a krill oil cap daily 

## 2020-07-09 NOTE — Progress Notes (Signed)
Subjective:    Patient ID: [name], male    DOB: 18-Nov-1940, 79 y.o.   MRN: [id number]  Chief Complaint  Patient presents with  . Follow-up    4 week    HPI Patient is in today for follow up. He is accompanied by his wife and they report he is doing well. There biggest stressor is their adopted son chose not to get vaccinated and now has covid. Otherwise no recent febrile illness or hsopitalizations. Denies CP/palp/SOB/HA/congestion/fevers/GI or GU c/o. Taking meds as prescribed  Past Medical History:  Diagnosis Date  . 1st degree AV block 11/09/2018   Noted on EKG   . Anemia   . Aortic stenosis, severe    S/p Edwards Sapien 3 Transcatheter Heart Valve (size 26 mm, model # [id number], serial # [id number])  . Arthritis   . Back pain   . Bursitis   . Cataract    left immature  . Cellulitis 10/12/2015  . CHF (congestive heart failure) ([place])   . Complication of anesthesia    Halucinations  . Constipation    takes Miralax daily as well as Senokot daily  . DDD (degenerative disc disease)   . Gout    takes Allopurinol daily  . Heart valve disorder   . History of blood clots 1962   knee  . History of blood transfusion    no abnormal reaction noted  . History of shingles   . Hyperlipidemia    takes Atorvastatin daily  . Hypertension    takes Lisinopril daily  . Joint pain   . Low back pain 01/25/2017  . Morbid obesity ([place])   . Neck pain on left side 01/25/2017  . OSA (obstructive sleep apnea)   . Osteoarthritis   . Peripheral edema    takes Furosemide daily  . Peripheral neuropathy    takes Gabapentin daily  . Peroneal palsy    significant right foot drop  . Persistent atrial fibrillation ([place])   . Pneumonia 25+yrs ago   hx of  . Prostate cancer ([place])   . Prostate cancer ([place]) 02/13/2019  . RBBB 11/09/2018   Noted on EKG  . Shortness of breath   . Swelling of extremity   . Thrombocytopenia ([place])   . Urinary frequency   . Urinary urgency   . Valvular  heart disease   . Venous stasis dermatitis     Past Surgical History:  Procedure Laterality Date  . CARDIAC CATHETERIZATION N/A 05/02/2015   Procedure: Right/Left Heart Cath and Coronary Angiography;  Surgeon: Burnell Blanks, MD;  Location: [place] [facility];  Service: Cardiovascular;  Laterality: N/A;  . CARDIOVERSION N/A 10/27/2018   Procedure: CARDIOVERSION;  Surgeon: Fay Records, MD;  Location: [facility] ENDOSCOPY;  Service: Cardiovascular;  Laterality: N/A;  . COLONOSCOPY N/A 02/07/2013   Procedure: COLONOSCOPY;  Surgeon: Juanita Craver, MD;  Location: [facility] ENDOSCOPY;  Service: Endoscopy;  Laterality: N/A;  . COLONOSCOPY N/A 02/09/2013   Procedure: COLONOSCOPY;  Surgeon: Beryle Beams, MD;  Location: [place];  Service: Endoscopy;  Laterality: N/A;  . ESOPHAGOGASTRODUODENOSCOPY N/A 02/07/2013   Procedure: ESOPHAGOGASTRODUODENOSCOPY (EGD);  Surgeon: Juanita Craver, MD;  Location: [facility] ENDOSCOPY;  Service: Endoscopy;  Laterality: N/A;  . GIVENS CAPSULE STUDY N/A 02/09/2013   Procedure: GIVENS CAPSULE STUDY;  Surgeon: Beryle Beams, MD;  Location: [place];  Service: Endoscopy;  Laterality: N/A;  . HERNIA REPAIR     umbilical hernia  . KNEE SURGERY Right    multiple  knee surgeries due to complication of R TKA  . LAMINOTOMY  3382   c6-t2  . PACEMAKER IMPLANT N/A 04/27/2018   Procedure: PACEMAKER IMPLANT;  Surgeon: Constance Haw, MD;  Location: [place] [facility];  Service: Cardiovascular;  Laterality: N/A;  . PROSTATE BIOPSY N/A 04/12/2019   Procedure: BIOPSY TRANSRECTAL ULTRASONIC PROSTATE (TUBP) CYSTOSCOPY;  Surgeon: [name] Hughs, MD;  Location: [facility];  Service: Urology;  Laterality: N/A;  . SPINE SURGERY    . TEE WITHOUT CARDIOVERSION N/A 03/07/2015   Procedure: TRANSESOPHAGEAL ECHOCARDIOGRAM (TEE);  Surgeon: Josue Hector, MD;  Location: [facility] ENDOSCOPY;  Service: Cardiovascular;  Laterality: N/A;  ANES TO BRING PROPOFOL PER DOCTOR  . TEE WITHOUT CARDIOVERSION N/A 06/10/2015     Procedure: TRANSESOPHAGEAL ECHOCARDIOGRAM (TEE);  Surgeon: Burnell Blanks, MD;  Location: [place];  Service: Open Heart Surgery;  Laterality: N/A;  . TOTAL KNEE ARTHROPLASTY     right   x4 left x 1   . TRANSCATHETER AORTIC VALVE REPLACEMENT, TRANSFEMORAL Left 06/10/2015   Procedure: TRANSCATHETER AORTIC VALVE REPLACEMENT, TRANSFEMORAL;  Surgeon: Burnell Blanks, MD;  Location: [place];  Service: Open Heart Surgery;  Laterality: Left;    Family History  Problem Relation Age of Onset  . Other Father        POSSIBLE HEART ATTACK  . Arthritis Sister        "crippling"  . Prostate cancer Brother   . Arthritis Sister   . Breast cancer Neg Hx   . Colon cancer Neg Hx     Social History   Socioeconomic History  . Marital status: Married    Spouse name: [name]  . Number of children: 3  . Years of education: Not on file  . Highest education level: Not on file  Occupational History  . Occupation: retired Nurse, mental health  . Smoking status: Former Smoker    Years: 30.00    Types: Pipe    Quit date: 09/20/2006    Years since quitting: 13.8  . Smokeless tobacco: Never Used  Vaping Use  . Vaping Use: Never used  Substance and Sexual Activity  . Alcohol use: No    Alcohol/week: 0.0 standard drinks  . Drug use: No  . Sexual activity: Not Currently    Comment: lives with wife, retired, no dietary restrictions  Other Topics Concern  . Not on file  Social History Narrative  . Not on file   Social Determinants of Health   Financial Resource Strain: Low Risk   . Difficulty of Paying Living Expenses: Not hard at all  Food Insecurity: No Food Insecurity  . Worried About Charity fundraiser in the Last Year: Never true  . Ran Out of Food in the Last Year: Never true  Transportation Needs: No Transportation Needs  . Lack of Transportation (Medical): No  . Lack of Transportation (Non-Medical): No  Physical Activity:   . Days of Exercise per Week: Not on file  .  Minutes of Exercise per Session: Not on file  Stress:   . Feeling of Stress : Not on file  Social Connections:   . Frequency of Communication with Friends and Family: Not on file  . Frequency of Social Gatherings with Friends and Family: Not on file  . Attends Religious Services: Not on file  . Active Member of Clubs or Organizations: Not on file  . Attends Archivist Meetings: Not on file  . Marital Status: Not on file  Intimate Partner Violence:   . Fear of Current or Ex-Partner: Not on file  . Emotionally Abused: Not on file  . Physically Abused: Not on file  . Sexually Abused: Not on file    Outpatient Medications Prior to Visit  Medication Sig Dispense Refill  . acetaminophen (TYLENOL) 500 MG tablet Take 1,000 mg by mouth 2 (two) times daily.     [name] albuterol (VENTOLIN HFA) 108 (90 Base) MCG/ACT inhaler Inhale 1-2 puffs into the lungs every 6 (six) hours as needed for wheezing or shortness of breath.    . allopurinol (ZYLOPRIM) 100 MG tablet Take 1 tablet (100 mg total) by mouth daily. 90 tablet 3  . atorvastatin (LIPITOR) 40 MG tablet Take 1 and a half tablet daily by mouth  (60mg ) (Patient taking differently: Take 60 mg by mouth daily. ) 135 tablet 5  . cephALEXin (KEFLEX) 500 MG capsule Take 1 capsule (500 mg total) by mouth 4 (four) times daily. 40 capsule 0  . Cholecalciferol (VITAMIN D3) 125 MCG (5000 UT) CAPS Take 5,000 Units by mouth daily.    . Coenzyme Q10 (COQ10) 100 MG CAPS Take 300 mg by mouth daily.     [name] CRANBERRY PO Take 1 capsule by mouth 2 (two) times a day.    [name] ELIQUIS 5 MG TABS tablet TAKE 1 TABLET BY MOUTH TWICE A DAY (Patient taking differently: Take 5 mg by mouth 2 (two) times daily. ) 60 tablet 6  . famotidine (PEPCID) 40 MG tablet Take 1 tablet (40 mg total) by mouth daily. 30 tablet 3  . fluticasone (FLOVENT HFA) 110 MCG/ACT inhaler Inhale 2 puffs into the lungs 2 (two) times daily. 1 Inhaler 0  . Fluticasone-Umeclidin-Vilant (TRELEGY ELLIPTA)  100-62.5-25 MCG/INH AEPB Inhale 1 puff into the lungs daily. 60 each 12  . furosemide (LASIX) 20 MG tablet TAKE 1 TABLET BY MOUTH TWICE A DAY (Patient taking differently: Take 20 mg by mouth 2 (two) times daily. ) 180 tablet 2  . gabapentin (NEURONTIN) 300 MG capsule 1 cap po bid and 2 caps po qhs (Patient taking differently: Take 300 mg by mouth 3 (three) times daily. ) 120 capsule 2  . levocetirizine (XYZAL) 5 MG tablet Take 1 tablet (5 mg total) by mouth every evening. 90 tablet 3  . Misc Natural Products (TART CHERRY ADVANCED PO) Take 1,200 mg by mouth 2 (two) times a day.     . montelukast (SINGULAIR) 10 MG tablet Take 1 tablet (10 mg total) by mouth at bedtime. 90 tablet 3  . Multiple Vitamins-Minerals (CENTRUM SILVER PO) Take 1 tablet by mouth daily.    . Phenazopyridine HCl (AZO-STANDARD PO) Take by mouth.    . polyethylene glycol (MIRALAX / GLYCOLAX) packet Take 17 g by mouth daily.    . sennosides-docusate sodium (SENOKOT-S) 8.6-50 MG tablet Take 1 tablet by mouth daily.     . vitamin C (ASCORBIC ACID) 500 MG tablet Take 500 mg by mouth 2 (two) times daily.     [name] HEMATINIC/FOLIC ACID 878-6 MG TABS TAKE 1 TABLET BY MOUTH EVERY DAY (Patient taking differently: Take 1 tablet by mouth daily. ) 90 tablet 1   No facility-administered medications prior to visit.    Allergies  Allergen Reactions  . Coumadin [Warfarin Sodium] Other (See Comments)    States he can't be on this-bleeds out    Review of Systems  Constitutional: Negative for chills, fever and malaise/fatigue.  HENT: Negative for congestion and hearing loss.   Eyes:  Negative for discharge.  Respiratory: Negative for cough, sputum production and shortness of breath.   Cardiovascular: Negative for chest pain, palpitations and leg swelling.  Gastrointestinal: Negative for abdominal pain, blood in stool, constipation, diarrhea, heartburn, nausea and vomiting.  Genitourinary: Negative for dysuria, frequency, hematuria and urgency.   Musculoskeletal: Negative for back pain, falls and myalgias.  Skin: Negative for rash.  Neurological: Negative for dizziness, sensory change, loss of consciousness, weakness and headaches.  Endo/Heme/Allergies: Negative for environmental allergies. Does not bruise/bleed easily.  Psychiatric/Behavioral: Negative for depression and suicidal ideas. The patient is not nervous/anxious and does not have insomnia.        Objective:    Physical Exam Vitals and nursing note reviewed.  Constitutional:      General: He is not in acute distress.    Appearance: He is well-developed.  HENT:     Head: Normocephalic and atraumatic.     Nose: Nose normal.  Eyes:     General:        Right eye: No discharge.        Left eye: No discharge.  Cardiovascular:     Rate and Rhythm: Normal rate and regular rhythm.     Heart sounds: No murmur heard.   Pulmonary:     Effort: Pulmonary effort is normal.     Breath sounds: Normal breath sounds.  Abdominal:     General: Bowel sounds are normal.     Palpations: Abdomen is soft.     Tenderness: There is no abdominal tenderness.  Musculoskeletal:     Cervical back: Normal range of motion and neck supple.  Skin:    General: Skin is warm and dry.  Neurological:     Mental Status: He is alert and oriented to person, place, and time.     BP 134/68 (BP Location: Left Arm, Patient Position: Sitting, Cuff Size: Normal)   Pulse 60   Temp (!) 97.3 F (36.3 C) (Temporal)   SpO2 91%  Wt Readings from Last 3 Encounters:  05/08/20 (!) 400 lb (181.4 kg)  04/25/20 (!) 370 lb (167.8 kg)  04/20/20 (!) 372 lb 12.8 oz (169.1 kg)    Diabetic Foot Exam - Simple   No data filed     Lab Results  Component Value Date   WBC 6.6 07/08/2020   HGB 11.1 (L) 07/08/2020   HCT 34.9 (L) 07/08/2020   PLT 145 07/08/2020   GLUCOSE 93 07/08/2020   CHOL 119 07/08/2020   TRIG 131 07/08/2020   HDL 29 (L) 07/08/2020   LDLDIRECT 85.0 07/28/2017   LDLCALC 69 07/08/2020    ALT 19 07/08/2020   AST 21 07/08/2020   NA 141 07/08/2020   K 4.5 07/08/2020   CL 100 07/08/2020   CREATININE 1.10 07/08/2020   BUN 26 (H) 07/08/2020   CO2 34 (H) 07/08/2020   TSH 1.29 07/08/2020   PSA 24.63 (H) 03/19/2019   INR 1.32 06/10/2015   HGBA1C 5.2 07/08/2020    Lab Results  Component Value Date   TSH 1.29 07/08/2020   Lab Results  Component Value Date   WBC 6.6 07/08/2020   HGB 11.1 (L) 07/08/2020   HCT 34.9 (L) 07/08/2020   MCV 99.4 07/08/2020   PLT 145 07/08/2020   Lab Results  Component Value Date   NA 141 07/08/2020   K 4.5 07/08/2020   CO2 34 (H) 07/08/2020   GLUCOSE 93 07/08/2020   BUN 26 (H) 07/08/2020   CREATININE 1.10 07/08/2020  BILITOT 0.4 07/08/2020   ALKPHOS 58 04/15/2020   AST 21 07/08/2020   ALT 19 07/08/2020   PROT 5.7 (L) 07/08/2020   ALBUMIN 3.3 (L) 04/15/2020   CALCIUM 9.4 07/08/2020   ANIONGAP 10 04/20/2020   GFR 64.13 03/10/2020   Lab Results  Component Value Date   CHOL 119 07/08/2020   Lab Results  Component Value Date   HDL 29 (L) 07/08/2020   Lab Results  Component Value Date   LDLCALC 69 07/08/2020   Lab Results  Component Value Date   TRIG 131 07/08/2020   Lab Results  Component Value Date   CHOLHDL 4.1 07/08/2020   Lab Results  Component Value Date   HGBA1C 5.2 07/08/2020       Assessment & Plan:   Problem List Items Addressed This Visit    HYPERCHOLESTEROLEMIA - Primary    Encouraged heart healthy diet, increase exercise, avoid trans fats, consider a krill oil cap daily      Relevant Orders   Lipid panel (Completed)   Gout    No recent flares.       Anemia   Relevant Medications   Ferrous Fumarate-Folic Acid (HEMATINIC/FOLIC ACID) 161-0 MG TABS   Other Relevant Orders   CBC with Differential/Platelet (Completed)   Aortic valve disorder   Relevant Orders   Comprehensive metabolic panel (Completed)   Hyperglycemia    hgba1c acceptable, minimize simple carbs. Increase exercise as  tolerated. Continue current meds      Relevant Orders   Hemoglobin A1c (Completed)   Essential hypertension    Well controlled, no changes to meds. Encouraged heart healthy diet such as the DASH diet and exercise as tolerated.       Relevant Orders   Comprehensive metabolic panel (Completed)   TSH (Completed)   Foot injury, left, sequela    He reports his feet have healed well he has not concerns         I have changed Ripley D. Ciocca's Hematinic/Folic Acid. I am also having him maintain his sennosides-docusate sodium, acetaminophen, CoQ10, polyethylene glycol, Misc Natural Products (TART CHERRY ADVANCED PO), vitamin C, Multiple Vitamins-Minerals (CENTRUM SILVER PO), Vitamin D3, CRANBERRY PO, Phenazopyridine HCl (AZO-STANDARD PO), atorvastatin, Eliquis, furosemide, gabapentin, levocetirizine, montelukast, albuterol, fluticasone, allopurinol, cephALEXin, Trelegy Ellipta, and famotidine.  Meds ordered this encounter  Medications  . Ferrous Fumarate-Folic Acid (HEMATINIC/FOLIC ACID) 960-4 MG TABS    Sig: Take 1 tablet by mouth daily.    Dispense:  90 tablet    Refill:  1     Penni Homans, MD

## 2020-07-09 NOTE — Assessment & Plan Note (Signed)
No recent flares 

## 2020-07-09 NOTE — Assessment & Plan Note (Signed)
hgba1c acceptable, minimize simple carbs. Increase exercise as tolerated. Continue current meds 

## 2020-07-14 DIAGNOSIS — G4733 Obstructive sleep apnea (adult) (pediatric): Secondary | ICD-10-CM | POA: Diagnosis not present

## 2020-07-14 DIAGNOSIS — J449 Chronic obstructive pulmonary disease, unspecified: Secondary | ICD-10-CM | POA: Diagnosis not present

## 2020-07-17 DIAGNOSIS — R062 Wheezing: Secondary | ICD-10-CM | POA: Diagnosis not present

## 2020-07-17 DIAGNOSIS — G4733 Obstructive sleep apnea (adult) (pediatric): Secondary | ICD-10-CM | POA: Diagnosis not present

## 2020-07-22 DIAGNOSIS — J9622 Acute and chronic respiratory failure with hypercapnia: Secondary | ICD-10-CM | POA: Diagnosis not present

## 2020-07-22 DIAGNOSIS — R062 Wheezing: Secondary | ICD-10-CM | POA: Diagnosis not present

## 2020-07-22 DIAGNOSIS — G4733 Obstructive sleep apnea (adult) (pediatric): Secondary | ICD-10-CM | POA: Diagnosis not present

## 2020-07-22 DIAGNOSIS — R69 Illness, unspecified: Secondary | ICD-10-CM | POA: Diagnosis not present

## 2020-07-22 DIAGNOSIS — J449 Chronic obstructive pulmonary disease, unspecified: Secondary | ICD-10-CM | POA: Diagnosis not present

## 2020-07-26 ENCOUNTER — Other Ambulatory Visit (HOSPITAL_COMMUNITY): Payer: Self-pay | Admitting: Nurse Practitioner

## 2020-07-27 ENCOUNTER — Other Ambulatory Visit: Payer: Self-pay | Admitting: Family Medicine

## 2020-07-28 ENCOUNTER — Other Ambulatory Visit (HOSPITAL_BASED_OUTPATIENT_CLINIC_OR_DEPARTMENT_OTHER): Payer: Self-pay | Admitting: Internal Medicine

## 2020-07-28 ENCOUNTER — Ambulatory Visit: Payer: Medicare HMO | Attending: Internal Medicine

## 2020-07-28 DIAGNOSIS — Z23 Encounter for immunization: Secondary | ICD-10-CM

## 2020-07-28 NOTE — Telephone Encounter (Signed)
Pt last saw Dr [name] 03/25/20, last labs 07/08/20 Creat 1.10, age 79, weight 181.4kg, based on specified criteria pt is on appropriate dosage of Eliquis 5mg  BID.  Will refill rx.

## 2020-07-28 NOTE — Progress Notes (Signed)
   Covid-19 Vaccination Clinic  Name:  Alphonso Gregson    MRN: [id number] DOB: [birth year]  07/28/2020  Mr. Dorsch was observed post Covid-19 immunization for 15 minutes without incident. He was provided with Vaccine Information Sheet and instruction to access the V-Safe system.   Mr. Riches was instructed to call 911 with any severe reactions post vaccine: [name] Difficulty breathing  . Swelling of face and throat  . A fast heartbeat  . A bad rash all over body  . Dizziness and weakness

## 2020-07-31 DIAGNOSIS — C61 Malignant neoplasm of prostate: Secondary | ICD-10-CM | POA: Diagnosis not present

## 2020-08-01 MED FILL — MODERNA COVID-19 VACCINE 10: 100 | 1 days supply | Qty: 0 | Fill #0

## 2020-08-04 ENCOUNTER — Ambulatory Visit: Payer: Medicare HMO | Admitting: Family Medicine

## 2020-08-05 ENCOUNTER — Ambulatory Visit (INDEPENDENT_AMBULATORY_CARE_PROVIDER_SITE_OTHER): Payer: Medicare HMO

## 2020-08-05 DIAGNOSIS — I441 Atrioventricular block, second degree: Secondary | ICD-10-CM | POA: Diagnosis not present

## 2020-08-05 LAB — CUP PACEART REMOTE DEVICE CHECK
Battery Remaining Longevity: 124 mo
Battery Remaining Percentage: 95.5 %
Battery Voltage: 3.01 V
Brady Statistic AP VP Percent: 41 %
Brady Statistic AP VS Percent: 1 %
Brady Statistic AS VP Percent: 43 %
Brady Statistic AS VS Percent: 15 %
Brady Statistic RA Percent Paced: 42 %
Brady Statistic RV Percent Paced: 84 %
Date Time Interrogation Session: 20211115020014
Implantable Lead Implant Date: 20190808
Implantable Lead Implant Date: 20190808
Implantable Lead Location: 753859
Implantable Lead Location: 753860
Implantable Pulse Generator Implant Date: 20190808
Lead Channel Impedance Value: 390 Ohm
Lead Channel Impedance Value: 460 Ohm
Lead Channel Pacing Threshold Amplitude: 0.5 V
Lead Channel Pacing Threshold Amplitude: 0.75 V
Lead Channel Pacing Threshold Pulse Width: 0.5 ms
Lead Channel Pacing Threshold Pulse Width: 0.5 ms
Lead Channel Sensing Intrinsic Amplitude: 12 mV
Lead Channel Sensing Intrinsic Amplitude: 2.9 mV
Lead Channel Setting Pacing Amplitude: 1 V
Lead Channel Setting Pacing Amplitude: 1.5 V
Lead Channel Setting Pacing Pulse Width: 0.5 ms
Lead Channel Setting Sensing Sensitivity: 2.5 mV
Pulse Gen Model: 2272
Pulse Gen Serial Number: 9052893

## 2020-08-07 DIAGNOSIS — Z5111 Encounter for antineoplastic chemotherapy: Secondary | ICD-10-CM | POA: Diagnosis not present

## 2020-08-07 DIAGNOSIS — C61 Malignant neoplasm of prostate: Secondary | ICD-10-CM | POA: Diagnosis not present

## 2020-08-07 DIAGNOSIS — E559 Vitamin D deficiency, unspecified: Secondary | ICD-10-CM | POA: Diagnosis not present

## 2020-08-07 DIAGNOSIS — M8580 Other specified disorders of bone density and structure, unspecified site: Secondary | ICD-10-CM | POA: Diagnosis not present

## 2020-08-07 NOTE — Progress Notes (Signed)
Remote pacemaker transmission.   

## 2020-08-09 ENCOUNTER — Other Ambulatory Visit: Payer: Self-pay | Admitting: Family Medicine

## 2020-08-09 ENCOUNTER — Other Ambulatory Visit: Payer: Self-pay | Admitting: Medical

## 2020-08-17 DIAGNOSIS — R062 Wheezing: Secondary | ICD-10-CM | POA: Diagnosis not present

## 2020-08-17 DIAGNOSIS — G4733 Obstructive sleep apnea (adult) (pediatric): Secondary | ICD-10-CM | POA: Diagnosis not present

## 2020-08-18 ENCOUNTER — Telehealth: Payer: Self-pay | Admitting: Family Medicine

## 2020-08-18 NOTE — Telephone Encounter (Signed)
Medication: allopurinol (ZYLOPRIM) 100 MG tablet    Has the patient contacted their pharmacy? No. (If no, request that the patient contact the pharmacy for the refill.) (If yes, when and what did the pharmacy advise?)  Preferred Pharmacy (with phone number or street name): [id number] - [place], [place] - [name]  [name] [place], [place] [address]  Phone:  [phone] Fax:  [phone]  DEA #:  [id number]  Agent: Please be advised that RX refills may take up to 3 business days. We ask that you follow-up with your pharmacy.

## 2020-08-19 ENCOUNTER — Other Ambulatory Visit: Payer: Self-pay

## 2020-08-19 DIAGNOSIS — M1A9XX0 Chronic gout, unspecified, without tophus (tophi): Secondary | ICD-10-CM

## 2020-08-19 MED ORDER — ALLOPURINOL 100 MG PO TABS: 100.0000 mg | ORAL_TABLET | Freq: Every day | ORAL | 3 refills | Status: AC

## 2020-08-19 NOTE — Telephone Encounter (Signed)
Medication sent to pharmacy as requested.

## 2020-08-21 DIAGNOSIS — G4733 Obstructive sleep apnea (adult) (pediatric): Secondary | ICD-10-CM | POA: Diagnosis not present

## 2020-08-21 DIAGNOSIS — R062 Wheezing: Secondary | ICD-10-CM | POA: Diagnosis not present

## 2020-08-21 DIAGNOSIS — J449 Chronic obstructive pulmonary disease, unspecified: Secondary | ICD-10-CM | POA: Diagnosis not present

## 2020-08-21 DIAGNOSIS — J9622 Acute and chronic respiratory failure with hypercapnia: Secondary | ICD-10-CM | POA: Diagnosis not present

## 2020-08-29 ENCOUNTER — Other Ambulatory Visit: Payer: Self-pay

## 2020-08-29 DIAGNOSIS — E7800 Pure hypercholesterolemia, unspecified: Secondary | ICD-10-CM

## 2020-08-29 DIAGNOSIS — I10 Essential (primary) hypertension: Secondary | ICD-10-CM

## 2020-09-05 NOTE — Progress Notes (Signed)
HPI M former smoker followed for OSA, COPD, chronic hypoxic respiratory failure complicated by morbid obesity, OHS, aortic stenosis/ AVR,  Osteoarthritis, Anemia PFT 05/05/2015-severe obstructive airways disease, insignificant response to bronchodilator, severe restriction, moderate diffusion defect FVC 2.17/46%, FEV1 1.50/44%, ratio 0.69, TLC 64%, DLCO 57% with volume correction to 62% of predicted NPSG 06/21/85- AHI 98/hour with desaturation to 64% BiPAP titration study-21/17, PS 4 cwp O2 2L sleep, with residual AHI 52 "unknown" events, either obstructive or central and presumably mostly RERAs.  ------------------------------------------------------------   05/08/20- 79 year old male former smoker followed for OSA, COPD mixed type, Chronic Respiratory failure with Hypoxia and Hypercapnia, complicated by morbid Obesity/ OHS, aortic stenosis/TAVR/ PAFib/ Eliquis, pacemaker, peripheral vascular disease, osteoarthritis, AVM colon, HBP, gout, Prostate cancer, HTN,  Body weight today- 400 lbs in hosp VPAPauto 12/12, PS 4/ O2 2L sleep/ [facility] Download compliance 100%, AHI 0.4/ hr Hosp f/u- 7/28-04/20/20-   Acute on chronic Resp Failure with Hypoxia and Hypercapnia He had taken extra oxycodone prior to admission. Managed in ICU with BIPAP.   Flovent  added at discharge. Short course prednisone, lasix.  Spiriva Handihaler, Flovent 110, Ventolin HFA, Singulair Had 2 Moderna Covax Wife getting dementia and uses walker. I suggested they start talking about assisted living.  CXR 04/17/20- IMPRESSION: Small left pleural effusion with left base atelectasis. No consolidation. Stable cardiomegaly. Pacemaker leads attached to right atrium and right ventricle. Aortic Atherosclerosis (ICD10-I70.0).  09/08/20- 79 year old male former smoker followed for OSA, COPD mixed type, Chronic Respiratory failure with Hypoxia and Hypercapnia, complicated by morbid Obesity/ OHS, aortic stenosis/TAVR/ PAFib/ Eliquis, pacemaker,  peripheral vascular disease, osteoarthritis, AVM colon, HBP, gout, Prostate cancer, HTN,  -Trelegy 100,  Ventolin HFA, Singulair VPAPauto 12/12, PS 0 / O2 2L sleep/ [employer] Download- compliance 100%, AHI 0.4/ hr Body weight today-385 lbs Covid vax- 3 Moderna Flu vax- had -----Patient is doing good overall, no concerns at this time.  We discussed med costs in doughnut hole.  Very comfortable with his VPAP. His own oximeter shows considerable variation at times from finger to finger. We will watch for need to qualify for portable O2.  No chest changes- cough, wheeze or chest pain.  CXR 05/08/20-  IMPRESSION: 1. Stable cardiomegaly. No pulmonary edema. No significant pleural effusion. 2. Mild peribronchial thickening.  ROS- see HPI  + = positive Constitutional:   No-   weight loss, night sweats, fevers, chills, +fatigue, lassitude. HEENT:   No-  headaches, difficulty swallowing, tooth/dental problems, sore throat,       No-  sneezing, itching, ear ache, nasal congestion, post nasal drip,  CV:  No-   chest pain, orthopnea, PND, swelling in lower extremities, anasarca,  dizziness, palpitations Resp: +shortness of breath with exertion or at rest.              No-   productive cough,  No non-productive cough,  No- coughing up of blood.              No-   change in color of mucus.  No- wheezing.   Skin: No-   rash or lesions. GI:  No-   heartburn, indigestion, abdominal pain, nausea, vomiting, GU:  MS:  No-   joint pain or swelling.  + back pain Neuro-     nothing unusual Psych:  No- change in mood or affect. No depression or anxiety.  No memory loss.  OBJ General- Alert, Oriented, Affect-appropriate, Distress- none acute. +Morbidly obese, +power wheelchair. Friendly, very talkative on room air.  Skin- rash-none,  lesions- none, excoriation- none Lymphadenopathy- none Head- atraumatic            Eyes- Gross vision intact, PERRLA, conjunctivae clear secretions            Ears- Hearing,  canals-normal            Nose- Clear, no-Septal dev, mucus, polyps, erosion, perforation             Throat- Mallampati III-IV , mucosa clear , drainage- none, tonsils- atrophic Neck- flexible , trachea midline, no stridor , thyroid nl, carotid no bruit Chest - symmetrical excursion , unlabored           Heart/CV- RRR , murmur-none, no gallop, no rub, nl s1 s2                           - JVD- none , edema+, stasis changes+, varices- none           Lung- clear to P&A, wheeze- none, cough- none , dullness-none, rub- none, no valve click heard                           Room air saturation while awake and sitting upright on arrival today 93%           Chest wall-+ pacemaker left  abd-  Br/ Gen/ Rectal- Not done, not indicated Extrem- cyanosis- none, clubbing, none, atrophy- none, strength- nl Neuro- grossly intact to observation, alert and pleasant

## 2020-09-08 ENCOUNTER — Encounter: Payer: Self-pay | Admitting: Internal Medicine

## 2020-09-08 ENCOUNTER — Ambulatory Visit: Payer: Medicare HMO | Admitting: Internal Medicine

## 2020-09-08 ENCOUNTER — Other Ambulatory Visit: Payer: Self-pay

## 2020-09-08 DIAGNOSIS — I872 Venous insufficiency (chronic) (peripheral): Secondary | ICD-10-CM

## 2020-09-08 DIAGNOSIS — G4733 Obstructive sleep apnea (adult) (pediatric): Secondary | ICD-10-CM | POA: Diagnosis not present

## 2020-09-08 DIAGNOSIS — J449 Chronic obstructive pulmonary disease, unspecified: Secondary | ICD-10-CM | POA: Diagnosis not present

## 2020-09-08 NOTE — Assessment & Plan Note (Addendum)
Benefits from his VPAP. We can continue 12/12 Offered help if he has communication trouble getting orders filled correctly by [employer].

## 2020-09-08 NOTE — Patient Instructions (Signed)
We can continue VPAP auto 12/12  We can continue current meds. I have taken Flovent hfa off your list because it is contained in your Trelegy inhaler  Please call if we can help

## 2020-09-08 NOTE — Assessment & Plan Note (Signed)
Morbidly obese and sedentary. Little prospect that this will improve., but encourage elevation of legs, use of elastic hose.

## 2020-09-08 NOTE — Assessment & Plan Note (Signed)
Current inhalers work well.  No wheeze or cough. Plan- continue current meds.

## 2020-09-09 ENCOUNTER — Telehealth (INDEPENDENT_AMBULATORY_CARE_PROVIDER_SITE_OTHER): Payer: Medicare HMO | Admitting: Family Medicine

## 2020-09-09 ENCOUNTER — Encounter: Payer: Self-pay | Admitting: Family Medicine

## 2020-09-09 VITALS — BP 124/70 | HR 65 | Temp 97.3°F

## 2020-09-09 DIAGNOSIS — D696 Thrombocytopenia, unspecified: Secondary | ICD-10-CM

## 2020-09-09 DIAGNOSIS — M1A9XX0 Chronic gout, unspecified, without tophus (tophi): Secondary | ICD-10-CM

## 2020-09-09 DIAGNOSIS — E7800 Pure hypercholesterolemia, unspecified: Secondary | ICD-10-CM

## 2020-09-09 DIAGNOSIS — I4819 Other persistent atrial fibrillation: Secondary | ICD-10-CM

## 2020-09-09 DIAGNOSIS — N289 Disorder of kidney and ureter, unspecified: Secondary | ICD-10-CM | POA: Diagnosis not present

## 2020-09-09 DIAGNOSIS — G4733 Obstructive sleep apnea (adult) (pediatric): Secondary | ICD-10-CM

## 2020-09-09 DIAGNOSIS — R739 Hyperglycemia, unspecified: Secondary | ICD-10-CM

## 2020-09-09 NOTE — Progress Notes (Signed)
Virtual Visit via Phone Note  I connected with [name] on 09/09/20 at  2:20 PM EST by a phone enabled telemedicine application and verified that I am speaking with the correct person using two identifiers.  Location: Patient: home, patient and provider in visit Provider: office   I discussed the limitations of evaluation and management by telemedicine and the availability of in person appointments. The patient expressed understanding and agreed to proceed. S Chism, CMA was able to get the patient setup on a phone visit after being unable to set up a video visit.    Subjective:    Patient ID: [name], male    DOB: [birth year], 79 y.o.   MRN: [id number]  Chief Complaint  Patient presents with  . Follow-up    HPI Patient is in today for follow up on chronic medical concerns. No recent febrile illness or hospitalizations. He is looking forward to Christmas as all of his kids are coming over. He denies any acute concerns. No polyuria or polydipsia noted. Denies CP/palp/SOB/HA/congestion/fevers/GI or GU c/o. Taking meds as prescribed  Past Medical History:  Diagnosis Date  . 1st degree AV block 11/09/2018   Noted on EKG   . Anemia   . Aortic stenosis, severe    S/p Edwards Sapien 3 Transcatheter Heart Valve (size 26 mm, model # [id number], serial # [id number])  . Arthritis   . Back pain   . Bursitis   . Cataract    left immature  . Cellulitis 10/12/2015  . CHF (congestive heart failure) ([place])   . Complication of anesthesia    Halucinations  . Constipation    takes Miralax daily as well as Senokot daily  . DDD (degenerative disc disease)   . Gout    takes Allopurinol daily  . Heart valve disorder   . History of blood clots 1962   knee  . History of blood transfusion    no abnormal reaction noted  . History of shingles   . Hyperlipidemia    takes Atorvastatin daily  . Hypertension    takes Lisinopril daily  . Joint pain   . Low back pain 01/25/2017  .  Morbid obesity ([place])   . Neck pain on left side 01/25/2017  . OSA (obstructive sleep apnea)   . Osteoarthritis   . Peripheral edema    takes Furosemide daily  . Peripheral neuropathy    takes Gabapentin daily  . Peroneal palsy    significant right foot drop  . Persistent atrial fibrillation ([place])   . Pneumonia 25+yrs ago   hx of  . Prostate cancer ([place])   . Prostate cancer ([place]) 02/13/2019  . RBBB 11/09/2018   Noted on EKG  . Shortness of breath   . Swelling of extremity   . Thrombocytopenia ([place])   . Urinary frequency   . Urinary urgency   . Valvular heart disease   . Venous stasis dermatitis     Past Surgical History:  Procedure Laterality Date  . CARDIAC CATHETERIZATION N/A 05/02/2015   Procedure: Right/Left Heart Cath and Coronary Angiography;  Surgeon: Burnell Blanks, MD;  Location: [facility];  Service: Cardiovascular;  Laterality: N/A;  . CARDIOVERSION N/A 10/27/2018   Procedure: CARDIOVERSION;  Surgeon: Fay Records, MD;  Location: [facility] ENDOSCOPY;  Service: Cardiovascular;  Laterality: N/A;  . COLONOSCOPY N/A 02/07/2013   Procedure: COLONOSCOPY;  Surgeon: Juanita Craver, MD;  Location: [facility] ENDOSCOPY;  Service: Endoscopy;  Laterality: N/A;  . COLONOSCOPY N/A  02/09/2013   Procedure: COLONOSCOPY;  Surgeon: Beryle Beams, MD;  Location: [facility];  Service: Endoscopy;  Laterality: N/A;  . ESOPHAGOGASTRODUODENOSCOPY N/A 02/07/2013   Procedure: ESOPHAGOGASTRODUODENOSCOPY (EGD);  Surgeon: Juanita Craver, MD;  Location: [facility] ENDOSCOPY;  Service: Endoscopy;  Laterality: N/A;  . GIVENS CAPSULE STUDY N/A 02/09/2013   Procedure: GIVENS CAPSULE STUDY;  Surgeon: Beryle Beams, MD;  Location: [place];  Service: Endoscopy;  Laterality: N/A;  . HERNIA REPAIR     umbilical hernia  . KNEE SURGERY Right    multiple knee surgeries due to complication of R TKA  . LAMINOTOMY  9628   c6-t2  . PACEMAKER IMPLANT N/A 04/27/2018   Procedure: PACEMAKER IMPLANT;  Surgeon: Constance Haw, MD;  Location: [facility];  Service: Cardiovascular;  Laterality: N/A;  . PROSTATE BIOPSY N/A 04/12/2019   Procedure: BIOPSY TRANSRECTAL ULTRASONIC PROSTATE (TUBP) CYSTOSCOPY;  Surgeon: [name] Hughs, MD;  Location: [facility];  Service: Urology;  Laterality: N/A;  . SPINE SURGERY    . TEE WITHOUT CARDIOVERSION N/A 03/07/2015   Procedure: TRANSESOPHAGEAL ECHOCARDIOGRAM (TEE);  Surgeon: Josue Hector, MD;  Location: [facility] ENDOSCOPY;  Service: Cardiovascular;  Laterality: N/A;  ANES TO BRING PROPOFOL PER DOCTOR  . TEE WITHOUT CARDIOVERSION N/A 06/10/2015   Procedure: TRANSESOPHAGEAL ECHOCARDIOGRAM (TEE);  Surgeon: Burnell Blanks, MD;  Location: [place];  Service: Open Heart Surgery;  Laterality: N/A;  . TOTAL KNEE ARTHROPLASTY     right   x4 left x 1   . TRANSCATHETER AORTIC VALVE REPLACEMENT, TRANSFEMORAL Left 06/10/2015   Procedure: TRANSCATHETER AORTIC VALVE REPLACEMENT, TRANSFEMORAL;  Surgeon: Burnell Blanks, MD;  Location: [place];  Service: Open Heart Surgery;  Laterality: Left;    Family History  Problem Relation Age of Onset  . Other Father        POSSIBLE HEART ATTACK  . Arthritis Sister        "crippling"  . Prostate cancer Brother   . Arthritis Sister   . Breast cancer Neg Hx   . Colon cancer Neg Hx     Social History   Socioeconomic History  . Marital status: Married    Spouse name: [name]  . Number of children: 3  . Years of education: Not on file  . Highest education level: Not on file  Occupational History  . Occupation: retired Nurse, mental health  . Smoking status: Former Smoker    Years: 30.00    Types: Pipe    Quit date: 09/20/2006    Years since quitting: 13.9  . Smokeless tobacco: Never Used  Vaping Use  . Vaping Use: Never used  Substance and Sexual Activity  . Alcohol use: No    Alcohol/week: 0.0 standard drinks  . Drug use: No  . Sexual activity: Not Currently    Comment: lives with wife, retired, no dietary  restrictions  Other Topics Concern  . Not on file  Social History Narrative  . Not on file   Social Determinants of Health   Financial Resource Strain: Low Risk   . Difficulty of Paying Living Expenses: Not hard at all  Food Insecurity: No Food Insecurity  . Worried About Charity fundraiser in the Last Year: Never true  . Ran Out of Food in the Last Year: Never true  Transportation Needs: No Transportation Needs  . Lack of Transportation (Medical): No  . Lack of Transportation (Non-Medical): No  Physical Activity: Not on file  Stress: Not  on file  Social Connections: Not on file  Intimate Partner Violence: Not on file    Outpatient Medications Prior to Visit  Medication Sig Dispense Refill  . acetaminophen (TYLENOL) 500 MG tablet Take 1,000 mg by mouth 2 (two) times daily.    [name] albuterol (VENTOLIN HFA) 108 (90 Base) MCG/ACT inhaler TAKE 2 PUFFS BY MOUTH EVERY 6 HOURS AS NEEDED 18 each 1  . allopurinol (ZYLOPRIM) 100 MG tablet Take 1 tablet (100 mg total) by mouth daily. 90 tablet 3  . atorvastatin (LIPITOR) 40 MG tablet TAKE 1 AND A HALF TABLET DAILY BY MOUTH (60MG ) 135 tablet 5  . cephALEXin (KEFLEX) 500 MG capsule Take 1 capsule (500 mg total) by mouth 4 (four) times daily. 40 capsule 0  . Cholecalciferol (VITAMIN D3) 125 MCG (5000 UT) CAPS Take 5,000 Units by mouth daily.    . Coenzyme Q10 (COQ10) 100 MG CAPS Take 300 mg by mouth daily.     [name] CRANBERRY PO Take 1 capsule by mouth 2 (two) times a day.    [name] ELIQUIS 5 MG TABS tablet TAKE 1 TABLET BY MOUTH TWICE A DAY 60 tablet 6  . famotidine (PEPCID) 40 MG tablet TAKE 1 TABLET BY MOUTH EVERY DAY 90 tablet 1  . Ferrous Fumarate-Folic Acid (HEMATINIC/FOLIC ACID) 093-2 MG TABS Take 1 tablet by mouth daily. 90 tablet 1  . Fluticasone-Umeclidin-Vilant (TRELEGY ELLIPTA) 100-62.5-25 MCG/INH AEPB Inhale 1 puff into the lungs daily. 60 each 12  . furosemide (LASIX) 20 MG tablet TAKE 1 TABLET BY MOUTH TWICE A DAY (Patient taking  differently: Take 20 mg by mouth 2 (two) times daily.) 180 tablet 2  . gabapentin (NEURONTIN) 300 MG capsule 1 cap po bid and 2 caps po qhs (Patient taking differently: Take 300 mg by mouth 3 (three) times daily.) 120 capsule 2  . levocetirizine (XYZAL) 5 MG tablet Take 1 tablet (5 mg total) by mouth every evening. 90 tablet 3  . Misc Natural Products (TART CHERRY ADVANCED PO) Take 1,200 mg by mouth 2 (two) times a day.     . montelukast (SINGULAIR) 10 MG tablet Take 1 tablet (10 mg total) by mouth at bedtime. 90 tablet 3  . Multiple Vitamins-Minerals (CENTRUM SILVER PO) Take 1 tablet by mouth daily.    . Phenazopyridine HCl (AZO-STANDARD PO) Take by mouth.    . polyethylene glycol (MIRALAX / GLYCOLAX) packet Take 17 g by mouth daily.    . sennosides-docusate sodium (SENOKOT-S) 8.6-50 MG tablet Take 1 tablet by mouth daily.     . vitamin C (ASCORBIC ACID) 500 MG tablet Take 500 mg by mouth 2 (two) times daily.      No facility-administered medications prior to visit.    Allergies  Allergen Reactions  . Coumadin [Warfarin Sodium] Other (See Comments)    States he can't be on this-bleeds out    Review of Systems  Constitutional: Negative for fever and malaise/fatigue.  HENT: Negative for congestion.   Eyes: Negative for blurred vision.  Respiratory: Negative for shortness of breath.   Cardiovascular: Negative for chest pain, palpitations and leg swelling.  Gastrointestinal: Negative for abdominal pain, blood in stool and nausea.  Genitourinary: Negative for dysuria and frequency.  Musculoskeletal: Negative for falls.  Skin: Negative for rash.  Neurological: Negative for dizziness, loss of consciousness and headaches.  Endo/Heme/Allergies: Negative for environmental allergies.  Psychiatric/Behavioral: Negative for depression. The patient is not nervous/anxious.        Objective:    Physical Exam  unable to obtain via phone visit  BP 124/70   Pulse 65   Temp (!) 97.3 F (36.3 C)    SpO2 93%  Wt Readings from Last 3 Encounters:  09/08/20 (!) 385 lb (174.6 kg)  05/08/20 (!) 400 lb (181.4 kg)  04/25/20 (!) 370 lb (167.8 kg)    Diabetic Foot Exam - Simple   No data filed    Lab Results  Component Value Date   WBC 6.6 07/08/2020   HGB 11.1 (L) 07/08/2020   HCT 34.9 (L) 07/08/2020   PLT 145 07/08/2020   GLUCOSE 93 07/08/2020   CHOL 119 07/08/2020   TRIG 131 07/08/2020   HDL 29 (L) 07/08/2020   LDLDIRECT 85.0 07/28/2017   LDLCALC 69 07/08/2020   ALT 19 07/08/2020   AST 21 07/08/2020   NA 141 07/08/2020   K 4.5 07/08/2020   CL 100 07/08/2020   CREATININE 1.10 07/08/2020   BUN 26 (H) 07/08/2020   CO2 34 (H) 07/08/2020   TSH 1.29 07/08/2020   PSA 24.63 (H) 03/19/2019   INR 1.32 06/10/2015   HGBA1C 5.2 07/08/2020    Lab Results  Component Value Date   TSH 1.29 07/08/2020   Lab Results  Component Value Date   WBC 6.6 07/08/2020   HGB 11.1 (L) 07/08/2020   HCT 34.9 (L) 07/08/2020   MCV 99.4 07/08/2020   PLT 145 07/08/2020   Lab Results  Component Value Date   NA 141 07/08/2020   K 4.5 07/08/2020   CO2 34 (H) 07/08/2020   GLUCOSE 93 07/08/2020   BUN 26 (H) 07/08/2020   CREATININE 1.10 07/08/2020   BILITOT 0.4 07/08/2020   ALKPHOS 58 04/15/2020   AST 21 07/08/2020   ALT 19 07/08/2020   PROT 5.7 (L) 07/08/2020   ALBUMIN 3.3 (L) 04/15/2020   CALCIUM 9.4 07/08/2020   ANIONGAP 10 04/20/2020   GFR 64.13 03/10/2020   Lab Results  Component Value Date   CHOL 119 07/08/2020   Lab Results  Component Value Date   HDL 29 (L) 07/08/2020   Lab Results  Component Value Date   LDLCALC 69 07/08/2020   Lab Results  Component Value Date   TRIG 131 07/08/2020   Lab Results  Component Value Date   CHOLHDL 4.1 07/08/2020   Lab Results  Component Value Date   HGBA1C 5.2 07/08/2020       Assessment & Plan:   Problem List Items Addressed This Visit    HYPERCHOLESTEROLEMIA    Tolerating statin, encouraged heart healthy diet, avoid  trans fats, minimize simple carbs and saturated fats. Increase exercise as tolerated      Relevant Orders   Lipid panel   TSH   Gout - Primary   Relevant Orders   TSH   Uric acid   Obstructive sleep apnea    Is following with Dr Annamaria Boots of pulmonology      Thrombocytopenia, unspecified ([place])    Asymptomatic, continue to monitor      Relevant Orders   CBC   Hyperglycemia    hgba1c acceptable, minimize simple carbs. Increase exercise as tolerated. Continue current meds      Relevant Orders   Hemoglobin A1c   Comprehensive metabolic panel   TSH   Renal insufficiency    Hydrate and monitor      Relevant Orders   Comprehensive metabolic panel   TSH   Persistent atrial fibrillation [facility])    Following with cardiology, asymptomatic and tolerating medications.  I am having [place] D. Codd maintain his sennosides-docusate sodium, acetaminophen, CoQ10, polyethylene glycol, Misc Natural Products (TART CHERRY ADVANCED PO), vitamin C, Multiple Vitamins-Minerals (CENTRUM SILVER PO), Vitamin D3, CRANBERRY PO, Phenazopyridine HCl (AZO-STANDARD PO), furosemide, gabapentin, levocetirizine, montelukast, cephALEXin, Trelegy Ellipta, Hematinic/Folic Acid, Eliquis, atorvastatin, famotidine, albuterol, and allopurinol.  No orders of the defined types were placed in this encounter.    I discussed the assessment and treatment plan with the patient. The patient was provided an opportunity to ask questions and all were answered. The patient agreed with the plan and demonstrated an understanding of the instructions.   The patient was advised to call back or seek an in-person evaluation if the symptoms worsen or if the condition fails to improve as anticipated.  I provided 25 minutes of non-face-to-face time during this encounter.   Penni Homans, MD

## 2020-09-09 NOTE — Assessment & Plan Note (Signed)
Following with cardiology, asymptomatic and tolerating medications.

## 2020-09-09 NOTE — Assessment & Plan Note (Signed)
Hydrate and monitor 

## 2020-09-09 NOTE — Assessment & Plan Note (Signed)
hgba1c acceptable, minimize simple carbs. Increase exercise as tolerated. Continue current meds 

## 2020-09-09 NOTE — Assessment & Plan Note (Signed)
Tolerating statin, encouraged heart healthy diet, avoid trans fats, minimize simple carbs and saturated fats. Increase exercise as tolerated 

## 2020-09-09 NOTE — Assessment & Plan Note (Signed)
Is following with Dr Annamaria Boots of pulmonology

## 2020-09-09 NOTE — Assessment & Plan Note (Signed)
Asymptomatic, continue to monitor.

## 2020-09-10 ENCOUNTER — Other Ambulatory Visit: Payer: Self-pay

## 2020-09-10 ENCOUNTER — Ambulatory Visit: Payer: Medicare HMO | Admitting: Pharmacist

## 2020-09-10 DIAGNOSIS — J449 Chronic obstructive pulmonary disease, unspecified: Secondary | ICD-10-CM

## 2020-09-10 DIAGNOSIS — E7800 Pure hypercholesterolemia, unspecified: Secondary | ICD-10-CM

## 2020-09-10 NOTE — Chronic Care Management (AMB) (Signed)
Chronic Care Management Pharmacy  Name: [name]  MRN: [id number] DOB: [birth year]  Chief Complaint/ HPI  [name],  79 y.o. , male presents for their Initial CCM visit with the clinical pharmacist via telephone.  PCP : Lance Lukes, MD  Their chronic conditions include: Hyperlipidemia, AFib, COPD, Gout, Osteoarthritis/Neuropathy, Edema  Office Visits: 09/09/20: Visit w/ Dr. Charlett [name] -  No med changes noted.  Consult Visit: 09/08/20: Pulmonary visit w/ Dr. Annamaria [name] - No med changes noted.  Medications: Outpatient Encounter Medications as of 09/10/2020  Medication Sig Note  . acetaminophen (TYLENOL) 500 MG tablet Take 1,000 mg by mouth 2 (two) times daily.   [name] allopurinol (ZYLOPRIM) 100 MG tablet Take 1 tablet (100 mg total) by mouth daily.   [name] atorvastatin (LIPITOR) 40 MG tablet TAKE 1 AND A HALF TABLET DAILY BY MOUTH (60MG)   . Cholecalciferol (VITAMIN D3) 125 MCG (5000 UT) CAPS Take 5,000 Units by mouth daily.   . Coenzyme Q10 (COQ10) 100 MG CAPS Take 300 mg by mouth daily.    [name] ELIQUIS 5 MG TABS tablet TAKE 1 TABLET BY MOUTH TWICE A Teagon Kron   . famotidine (PEPCID) 40 MG tablet TAKE 1 TABLET BY MOUTH EVERY Elynor Kallenberger   . Ferrous Fumarate-Folic Acid (HEMATINIC/FOLIC ACID) 456-2 MG TABS Take 1 tablet by mouth daily.   . Fluticasone-Umeclidin-Vilant (TRELEGY ELLIPTA) 100-62.5-25 MCG/INH AEPB Inhale 1 puff into the lungs daily.   . furosemide (LASIX) 20 MG tablet TAKE 1 TABLET BY MOUTH TWICE A Braelynne Garinger (Patient taking differently: Take 20 mg by mouth 2 (two) times daily.) 04/18/2020: LF 6.9.21 90DS  . gabapentin (NEURONTIN) 300 MG capsule 1 cap po bid and 2 caps po qhs (Patient taking differently: Take 300 mg by mouth 3 (three) times daily.) 04/18/2020: LF 6.8.21 90DS  . Misc Natural Products (TART CHERRY ADVANCED PO) Take 1,200 mg by mouth 2 (two) times a Rune Mendez.    . Multiple Vitamins-Minerals (CENTRUM SILVER PO) Take 1 tablet by mouth daily.   . Phenazopyridine HCl (AZO-STANDARD PO)  Take by mouth.   . polyethylene glycol (MIRALAX / GLYCOLAX) packet Take 17 g by mouth daily.   . vitamin C (ASCORBIC ACID) 500 MG tablet Take 500 mg by mouth 2 (two) times daily.    . [DISCONTINUED] CRANBERRY PO Take 1 capsule by mouth 2 (two) times a Shavaun Osterloh.   . albuterol (VENTOLIN HFA) 108 (90 Base) MCG/ACT inhaler TAKE 2 PUFFS BY MOUTH EVERY 6 HOURS AS NEEDED (Patient not taking: Reported on 09/10/2020)   . levocetirizine (XYZAL) 5 MG tablet Take 1 tablet (5 mg total) by mouth every evening. (Patient not taking: Reported on 09/10/2020) 04/18/2020: LF 4.12.21 90DS  . montelukast (SINGULAIR) 10 MG tablet Take 1 tablet (10 mg total) by mouth at bedtime. (Patient not taking: Reported on 09/10/2020) 04/18/2020: LF 4.12.21 90DS  . [DISCONTINUED] cephALEXin (KEFLEX) 500 MG capsule Take 1 capsule (500 mg total) by mouth 4 (four) times daily.   . [DISCONTINUED] sennosides-docusate sodium (SENOKOT-S) 8.6-50 MG tablet Take 1 tablet by mouth daily.     No facility-administered encounter medications on file as of 09/10/2020.   SDOH Screenings   Alcohol Screen: Not on file  Depression (PHQ2-9): Low Risk   . PHQ-2 Score: 0  Financial Resource Strain: Low Risk   . Difficulty of Paying Living Expenses: Not hard at all  Food Insecurity: No Food Insecurity  . Worried About Charity fundraiser in the Last Year: Never true  . Ran  Out of Food in the Last Year: Never true  Housing: Low Risk   . Last Housing Risk Score: 0  Physical Activity: Not on file  Social Connections: Not on file  Stress: Not on file  Tobacco Use: Medium Risk  . Smoking Tobacco Use: Former Smoker  . Smokeless Tobacco Use: Never Used  Transportation Needs: No Transportation Needs  . Lack of Transportation (Medical): No  . Lack of Transportation (Non-Medical): No     Current Diagnosis/Assessment:  Goals Addressed            This Visit's Progress   . Chronic Care Management Pharmacy Care Plan       CARE PLAN ENTRY (see  longitudinal plan of care for additional care plan information)  Current Barriers:  . Chronic Disease Management support, education, and care coordination needs related to Hyperlipidemia, AFib, COPD, Gout, Osteoarthritis/Neuropathy, Edema   Hyperlipidemia Lab Results  Component Value Date/Time   LDLCALC 69 07/08/2020 01:23 PM   LDLDIRECT 85.0 07/28/2017 04:28 PM   . Pharmacist Clinical Goal(s): o Over the next 180 days, patient will work with PharmD and providers to maintain LDL goal < 100 . Current regimen:  . Atorvastatin 56m #1.5 tabs daily . CoQ10 1028m#3 daily?  . Marland Kitchennterventions: o Discussed LDL goal  . Patient self care activities - Over the next 180 days, patient will: o Maintain cholesterol medication regimen.   COPD . Pharmacist Clinical Goal(s) o Over the next 180 days, patient will work with PharmD and providers to reduce symptoms associated with mixed type COPD . Current regimen:   Trelegy 1 puff daily  Ventolin HFA 2 puffs every 6 hours as needed (patient states he doesn't have at home)  Montelukast 1063maily . Interventions: o Discussed utility of rescue inhaler like Ventolin . Patient self care activities - Over the next 180 days, patient will: o Request rescue inhaler be sent to patient's pharmacy  Medication management . Pharmacist Clinical Goal(s): o Over the next 180 days, patient will work with PharmD and providers to maintain optimal medication adherence . [facility] . Interventions o Comprehensive medication review performed. o Continue current medication management strategy . Patient self care activities - Over the next 180 days, patient will: o Focus on medication adherence by filling and taking medications appropriately  o Take medications as prescribed o Report any questions or concerns to PharmD and/or provider(s)  Initial goal documentation       Social Hx:  Married 56 years 3 Sons 4 grandchildren   Hyperlipidemia    LDL goal < 100  Last lipids Lab Results  Component Value Date   CHOL 119 07/08/2020   HDL 29 (L) 07/08/2020   LDLCALC 69 07/08/2020   LDLDIRECT 85.0 07/28/2017   TRIG 131 07/08/2020   CHOLHDL 4.1 07/08/2020   Hepatic Function Latest Ref Rng & Units 07/08/2020 06/12/2020 05/29/2020  Total Protein 6.1 - 8.1 g/dL 5.7(L) 6.0(L) 5.3(L)  Albumin 3.5 - 5.0 g/dL - - -  AST 10 - 35 U/L _0 ALT 9 - 46 U/L _1 Alk Phosphatase 38 - 126 U/L - - -  Total Bilirubin 0.2 - 1.2 mg/dL 0.4 0.5 0.4  Bilirubin, Direct 0.0 - 0.3 mg/dL - - -     The ASCVD Risk score (GoMikey Bussing Jr., et al., 2013) failed to calculate for the following reasons:   The valid total cholesterol range is 130 to 320 mg/dL   Patient has failed these  meds in past: None noted  Patient is currently controlled on the following medications:  . Atorvastatin 64m #1.5 tabs daily . CoQ10 1068m#3 daily?   We discussed:  LDL goal  Plan -Continue current medications   AFIB   Patient is currently pacemaker controlled.  Patient has failed these meds in past: None noted  Patient is currently controlled on the following medications:  Eliquis 75m41mwice daily  Denies symptoms  Plan -Continue current medications   COPD / Asthma / Tobacco   Eosinophil count:   Lab Results  Component Value Date/Time   EOSPCT 1.2 07/08/2020 01:23 PM  %                               Eos (Absolute):  Lab Results  Component Value Date/Time   EOSABS 79 07/08/2020 01:23 PM    Tobacco Status:  Social History   Tobacco Use  Smoking Status Former Smoker  . Years: 30.00  . Types: Pipe  . Quit date: 09/20/2006  . Years since quitting: 14.0  Smokeless Tobacco Never Used    Patient has failed these meds in past: None noted  Patient is currently controlled on the following medications:  Trelegy 1 puff daily  Ventolin HFA 2 puffs every 6 hours as needed (patient states he doesn't have at home)  Montelukast 66m24mily Using  maintenance inhaler regularly? Yes Frequency of rescue inhaler use:  never (does not have one at home, states he was stopped by pulmonology; states he doesn't have SOB)  Patient states he doesn't have a rescue inhaler available.  Will coordinate with Dr. YounAnnamaria Bootsget this sent in for patient.   Plan -Coordinate with Dr. YounAnnamaria Bootsget patient a rescue inhaler sent to his pharmacy -Continue current medications  Gout   Uric Acid, Serum  Date Value Ref Range Status  03/10/2020 7.4 4.0 - 7.8 mg/dL Final  11/20/2019 7.9 (H) 4.0 - 7.8 mg/dL Final  11/24/2017 6.1 4.0 - 7.8 mg/dL Final     Goal Uric Acid < 6 mg/dL   Medications that may increase uric acid levels: Loop Diuretics  and Aspirin  Last gout flare: 3-4 years ago (gout in wrist)  Patient has failed these meds in past: None noted  Patient is currently controlled on the following medications:  . Allopurinol 100mg75mly  Plan -Continue current medications   Osteoarthritis/Neuropathy    Patient has failed these meds in past: None noted  Patient is currently controlled on the following medications:  Tylenol 500mg 50mwice daily  Gabapentin 300mg t56m times daily   Plan -Continue current medications   Edema    Patient has failed these meds in past: None noted  Patient is currently controlled on the following medications: . Furosemide 20mg tw7mdaily (6am and 4pm)  Has always gotten up once a night to urinate since a child   Plan -Continue current medications  Vaccines   Reviewed and discussed patient's vaccination history.   Patient is up to date on vaccines  Immunization History  Administered Date(s) Administered  . Influenza Split 07/08/2011, 06/20/2012  . Influenza, High Dose Seasonal PF 06/22/2016, 07/06/2017, 06/09/2018, 05/02/2019, 05/30/2020  . Influenza,inj,Quad PF,6+ Mos 06/07/2013, 07/29/2014, 07/18/2015  . Influenza-Unspecified 06/26/2017  . Moderna SARS-COV2 Booster Vaccination 07/28/2020   . Moderna Sars-Covid-2 Vaccination 10/27/2019, 11/19/2019  . PPD Test 02/16/2013  . Pneumococcal Conjugate-13 10/11/2013  . Pneumococcal Polysaccharide-23 11/07/2015  . Td 08/26/2006, 03/28/2018  .  Zoster 07/27/2013  . Zoster Recombinat (Shingrix) 11/08/2018, 03/25/2019    Medication Management   Patient's preferred pharmacy is:  [id number] [place], [place] - [address] [place] [address]Phone: [phone]: [phone]  Miscellaneous Meds Aspirin 859m- ok to continue per cardio Vitamin D 5000 units daily #2 daily - instructed to take this dose after prostate cancer Famotidine 4090maily - recommended by Dr. BlyCharlett Blakeut patient hasn't had symptoms Ferrous Fumarate/Folic Acid 324128/2KSrt Cherry - for arthritis Phenazopyridine/AZO - for recurrent UTI Miralax Vitamin C 500m21malcium 1200mg40mly - instructed to take this dose after prostate cancer B12 2500mcg63mlan  Continue current medication management strategy    Follow up: 6 month phone visit  KaneshDe BlanchmD, BCACP Clinical Pharmacist [phone]

## 2020-09-16 DIAGNOSIS — G4733 Obstructive sleep apnea (adult) (pediatric): Secondary | ICD-10-CM | POA: Diagnosis not present

## 2020-09-16 DIAGNOSIS — R062 Wheezing: Secondary | ICD-10-CM | POA: Diagnosis not present

## 2020-09-18 ENCOUNTER — Encounter: Payer: Self-pay | Admitting: Pharmacist

## 2020-09-18 NOTE — Patient Instructions (Addendum)
Visit Information  Goals Addressed            This Visit's Progress   . Chronic Care Management Pharmacy Care Plan       CARE PLAN ENTRY (see longitudinal plan of care for additional care plan information)  Current Barriers:  . Chronic Disease Management support, education, and care coordination needs related to Hyperlipidemia, AFib, COPD, Gout, Osteoarthritis/Neuropathy, Edema   Hyperlipidemia Lab Results  Component Value Date/Time   LDLCALC 69 07/08/2020 01:23 PM   LDLDIRECT 85.0 07/28/2017 04:28 PM   . Pharmacist Clinical Goal(s): o Over the next 180 days, patient will work with PharmD and providers to maintain LDL goal < 100 . Current regimen:  . Atorvastatin 40mg  #1.5 tabs daily . CoQ10 100mg  #3 daily?  [name] Interventions: o Discussed LDL goal  . Patient self care activities - Over the next 180 days, patient will: o Maintain cholesterol medication regimen.   COPD . Pharmacist Clinical Goal(s) o Over the next 180 days, patient will work with PharmD and providers to reduce symptoms associated with mixed type COPD . Current regimen:   Trelegy 1 puff daily  Ventolin HFA 2 puffs every 6 hours as needed (patient states he doesn't have at home)  Montelukast 10mg  daily . Interventions: o Discussed utility of rescue inhaler like Ventolin . Patient self care activities - Over the next 180 days, patient will: o Request rescue inhaler be sent to patient's pharmacy  Medication management . Pharmacist Clinical Goal(s): o Over the next 180 days, patient will work with PharmD and providers to maintain optimal medication adherence . [facility] . Interventions o Comprehensive medication review performed. o Continue current medication management strategy . Patient self care activities - Over the next 180 days, patient will: o Focus on medication adherence by filling and taking medications appropriately  o Take medications as prescribed o Report any questions or  concerns to PharmD and/or provider(s)  Initial goal documentation        Mr. [name] was given information about Chronic Care Management services today including:  1. CCM service includes personalized support from designated clinical staff supervised by his physician, including individualized plan of care and coordination with other care providers 2. 24/7 contact phone numbers for assistance for urgent and routine care needs. 3. Standard insurance, coinsurance, copays and deductibles apply for chronic care management only during months in which we provide at least 20 minutes of these services. Most insurances cover these services at 100%, however patients may be responsible for any copay, coinsurance and/or deductible if applicable. This service may help you avoid the need for more expensive face-to-face services. 4. Only one practitioner may furnish and bill the service in a calendar month. 5. The patient may stop CCM services at any time (effective at the end of the month) by phone call to the office staff.  Patient agreed to services and verbal consent obtained.   The patient verbalized understanding of instructions, educational materials, and care plan provided today and agreed to receive a mailed copy of patient instructions, educational materials, and care plan.  Telephone follow up appointment with pharmacy team member scheduled for: 03/11/2021  [name], [facility]    Cholesterol Content in Foods Cholesterol is a waxy, fat-like substance that helps to carry fat in the blood. The body needs cholesterol in small amounts, but too much cholesterol can cause damage to the arteries and heart. Most people should eat less than 200 milligrams (mg) of cholesterol a Analyssa Downs. Foods with  cholesterol  Cholesterol is found in animal-based foods, such as meat, seafood, and dairy. Generally, low-fat dairy and lean meats have less cholesterol than full-fat dairy and fatty meats. The milligrams of cholesterol  per serving (mg per serving) of common cholesterol-containing foods are listed below. Meat and other proteins  Egg -- one large whole egg has 186 mg.  Veal shank -- 4 oz has 141 mg.  Lean ground [place] (93% lean) -- 4 oz has 118 mg.  Fat-trimmed lamb loin -- 4 oz has 106 mg.  Lean ground beef (90% lean) -- 4 oz has 100 mg.  Lobster -- 3.5 oz has 90 mg.  Pork loin chops -- 4 oz has 86 mg.  Canned salmon -- 3.5 oz has 83 mg.  Fat-trimmed beef top loin -- 4 oz has 78 mg.  Frankfurter -- 1 frank (3.5 oz) has 77 mg.  Crab -- 3.5 oz has 71 mg.  Roasted chicken without skin, white meat -- 4 oz has 66 mg.  Light bologna -- 2 oz has 45 mg.  Deli-cut [place] -- 2 oz has 31 mg.  Canned tuna -- 3.5 oz has 31 mg.  [name] -- 1 oz has 29 mg.  Oysters and mussels (raw) -- 3.5 oz has 25 mg.  Mackerel -- 1 oz has 22 mg.  Trout -- 1 oz has 20 mg.  Pork sausage -- 1 link (1 oz) has 17 mg.  Salmon -- 1 oz has 16 mg.  Tilapia -- 1 oz has 14 mg. Dairy  Soft-serve ice cream --  cup (4 oz) has 103 mg.  Whole-milk yogurt -- 1 cup (8 oz) has 29 mg.  Cheddar cheese -- 1 oz has 28 mg.  American cheese -- 1 oz has 28 mg.  Whole milk -- 1 cup (8 oz) has 23 mg.  2% milk -- 1 cup (8 oz) has 18 mg.  Cream cheese -- 1 tablespoon (Tbsp) has 15 mg.  Cottage cheese --  cup (4 oz) has 14 mg.  Low-fat (1%) milk -- 1 cup (8 oz) has 10 mg.  Sour cream -- 1 Tbsp has 8.5 mg.  Low-fat yogurt -- 1 cup (8 oz) has 8 mg.  Nonfat Greek yogurt -- 1 cup (8 oz) has 7 mg.  Half-and-half cream -- 1 Tbsp has 5 mg. Fats and oils  Cod liver oil -- 1 tablespoon (Tbsp) has 82 mg.  Butter -- 1 Tbsp has 15 mg.  Lard -- 1 Tbsp has 14 mg.  Bacon grease -- 1 Tbsp has 14 mg.  Mayonnaise -- 1 Tbsp has 5-10 mg.  Margarine -- 1 Tbsp has 3-10 mg. Exact amounts of cholesterol in these foods may vary depending on specific ingredients and brands. Foods without cholesterol Most plant-based foods do not  have cholesterol unless you combine them with a food that has cholesterol. Foods without cholesterol include:  Grains and cereals.  Vegetables.  Fruits.  Vegetable oils, such as olive, canola, and sunflower oil.  Legumes, such as peas, beans, and lentils.  Nuts and seeds.  Egg whites. Summary  The body needs cholesterol in small amounts, but too much cholesterol can cause damage to the arteries and heart.  Most people should eat less than 200 milligrams (mg) of cholesterol a [place]. This information is not intended to replace advice given to you by your health care provider. Make sure you discuss any questions you have with your health care provider. Document Revised: 08/19/2017 Document Reviewed: 05/03/2017 Elsevier Patient Education  2020 [employer]  

## 2020-09-21 DIAGNOSIS — J9622 Acute and chronic respiratory failure with hypercapnia: Secondary | ICD-10-CM | POA: Diagnosis not present

## 2020-09-21 DIAGNOSIS — R062 Wheezing: Secondary | ICD-10-CM | POA: Diagnosis not present

## 2020-09-21 DIAGNOSIS — G4733 Obstructive sleep apnea (adult) (pediatric): Secondary | ICD-10-CM | POA: Diagnosis not present

## 2020-09-21 DIAGNOSIS — J449 Chronic obstructive pulmonary disease, unspecified: Secondary | ICD-10-CM | POA: Diagnosis not present

## 2020-09-25 ENCOUNTER — Other Ambulatory Visit: Payer: Self-pay | Admitting: Cardiovascular Disease

## 2020-09-25 ENCOUNTER — Other Ambulatory Visit: Payer: Self-pay | Admitting: Family Medicine

## 2020-10-01 DIAGNOSIS — Z008 Encounter for other general examination: Secondary | ICD-10-CM | POA: Diagnosis not present

## 2020-10-01 DIAGNOSIS — J449 Chronic obstructive pulmonary disease, unspecified: Secondary | ICD-10-CM | POA: Diagnosis not present

## 2020-10-01 DIAGNOSIS — E785 Hyperlipidemia, unspecified: Secondary | ICD-10-CM | POA: Diagnosis not present

## 2020-10-01 DIAGNOSIS — G629 Polyneuropathy, unspecified: Secondary | ICD-10-CM | POA: Diagnosis not present

## 2020-10-01 DIAGNOSIS — E261 Secondary hyperaldosteronism: Secondary | ICD-10-CM | POA: Diagnosis not present

## 2020-10-01 DIAGNOSIS — G4733 Obstructive sleep apnea (adult) (pediatric): Secondary | ICD-10-CM | POA: Diagnosis not present

## 2020-10-01 DIAGNOSIS — K219 Gastro-esophageal reflux disease without esophagitis: Secondary | ICD-10-CM | POA: Diagnosis not present

## 2020-10-01 DIAGNOSIS — I739 Peripheral vascular disease, unspecified: Secondary | ICD-10-CM | POA: Diagnosis not present

## 2020-10-01 DIAGNOSIS — I4891 Unspecified atrial fibrillation: Secondary | ICD-10-CM | POA: Diagnosis not present

## 2020-10-01 DIAGNOSIS — I509 Heart failure, unspecified: Secondary | ICD-10-CM | POA: Diagnosis not present

## 2020-10-01 DIAGNOSIS — D6869 Other thrombophilia: Secondary | ICD-10-CM | POA: Diagnosis not present

## 2020-10-07 ENCOUNTER — Other Ambulatory Visit: Payer: Medicare HMO

## 2020-10-09 ENCOUNTER — Other Ambulatory Visit (INDEPENDENT_AMBULATORY_CARE_PROVIDER_SITE_OTHER): Payer: Medicare HMO

## 2020-10-09 ENCOUNTER — Other Ambulatory Visit: Payer: Self-pay

## 2020-10-09 DIAGNOSIS — M1A9XX0 Chronic gout, unspecified, without tophus (tophi): Secondary | ICD-10-CM | POA: Diagnosis not present

## 2020-10-09 DIAGNOSIS — E7800 Pure hypercholesterolemia, unspecified: Secondary | ICD-10-CM

## 2020-10-09 DIAGNOSIS — D696 Thrombocytopenia, unspecified: Secondary | ICD-10-CM

## 2020-10-09 DIAGNOSIS — R739 Hyperglycemia, unspecified: Secondary | ICD-10-CM | POA: Diagnosis not present

## 2020-10-09 DIAGNOSIS — N289 Disorder of kidney and ureter, unspecified: Secondary | ICD-10-CM | POA: Diagnosis not present

## 2020-10-09 LAB — CBC
HCT: 38.4 % — ABNORMAL LOW (ref 39.0–52.0)
Hemoglobin: 12.4 g/dL — ABNORMAL LOW (ref 13.0–17.0)
MCHC: 32.4 g/dL (ref 30.0–36.0)
MCV: 97.4 fl (ref 78.0–100.0)
Platelets: 126 10*3/uL — ABNORMAL LOW (ref 150.0–400.0)
RBC: 3.94 Mil/uL — ABNORMAL LOW (ref 4.22–5.81)
RDW: 16.5 % — ABNORMAL HIGH (ref 11.5–15.5)
WBC: 5.8 10*3/uL (ref 4.0–10.5)

## 2020-10-09 LAB — HEMOGLOBIN A1C: Hgb A1c MFr Bld: 5.6 % (ref 4.6–6.5)

## 2020-10-09 LAB — COMPREHENSIVE METABOLIC PANEL
ALT: 20 U/L (ref 0–53)
AST: 25 U/L (ref 0–37)
Albumin: 4 g/dL (ref 3.5–5.2)
Alkaline Phosphatase: 75 U/L (ref 39–117)
BUN: 29 mg/dL — ABNORMAL HIGH (ref 6–23)
CO2: 38 mEq/L — ABNORMAL HIGH (ref 19–32)
Calcium: 9.8 mg/dL (ref 8.4–10.5)
Chloride: 95 mEq/L — ABNORMAL LOW (ref 96–112)
Creatinine, Ser: 1.15 mg/dL (ref 0.40–1.50)
GFR: 61.1 mL/min (ref >60.00)
Glucose, Bld: 100 mg/dL — ABNORMAL HIGH (ref 70–99)
Potassium: 4.8 mEq/L (ref 3.5–5.1)
Sodium: 141 mEq/L (ref 135–145)
Total Bilirubin: 0.5 mg/dL (ref 0.2–1.2)
Total Protein: 6.3 g/dL (ref 6.0–8.3)

## 2020-10-09 LAB — LIPID PANEL
Cholesterol: 150 mg/dL (ref 0–200)
HDL: 30.5 mg/dL — ABNORMAL LOW (ref >39.00)
NonHDL: 119.87
Total CHOL/HDL Ratio: 5
Triglycerides: 230 mg/dL — ABNORMAL HIGH (ref 0.0–149.0)
VLDL: 46 mg/dL — ABNORMAL HIGH (ref 0.0–40.0)

## 2020-10-09 LAB — TSH: TSH: 1.64 u[IU]/mL (ref 0.35–4.50)

## 2020-10-09 LAB — LDL CHOLESTEROL, DIRECT: Direct LDL: 94 mg/dL

## 2020-10-09 LAB — URIC ACID: Uric Acid, Serum: 7.3 mg/dL (ref 4.0–7.8)

## 2020-10-15 ENCOUNTER — Other Ambulatory Visit: Payer: Self-pay

## 2020-10-15 ENCOUNTER — Ambulatory Visit (INDEPENDENT_AMBULATORY_CARE_PROVIDER_SITE_OTHER): Payer: Medicare HMO | Admitting: Podiatry

## 2020-10-15 ENCOUNTER — Encounter: Payer: Self-pay | Admitting: Podiatry

## 2020-10-15 DIAGNOSIS — M79675 Pain in left toe(s): Secondary | ICD-10-CM | POA: Diagnosis not present

## 2020-10-15 DIAGNOSIS — M79674 Pain in right toe(s): Secondary | ICD-10-CM

## 2020-10-15 DIAGNOSIS — B351 Tinea unguium: Secondary | ICD-10-CM | POA: Diagnosis not present

## 2020-10-15 DIAGNOSIS — S90211A Contusion of right great toe with damage to nail, initial encounter: Secondary | ICD-10-CM | POA: Diagnosis not present

## 2020-10-15 DIAGNOSIS — D689 Coagulation defect, unspecified: Secondary | ICD-10-CM

## 2020-10-17 DIAGNOSIS — R062 Wheezing: Secondary | ICD-10-CM | POA: Diagnosis not present

## 2020-10-17 DIAGNOSIS — G4733 Obstructive sleep apnea (adult) (pediatric): Secondary | ICD-10-CM | POA: Diagnosis not present

## 2020-10-19 NOTE — Progress Notes (Signed)
Subjective:  Patient ID: [name], male    DOB: 01-18-1941,  MRN: [id number]  80 y.o. male presents with for at risk foot care. Patient has h/o PVD and painful thick toenails that are difficult to trim. Pain interferes with ambulation. Aggravating factors include wearing enclosed shoe gear. Pain is relieved with periodic professional debridement..    Review of Systems: Negative except as noted in the HPI.  Past Medical History:  Diagnosis Date  . 1st degree AV block 11/09/2018   Noted on EKG   . Anemia   . Aortic stenosis, severe    S/p Edwards Sapien 3 Transcatheter Heart Valve (size 26 mm, model # [id number], serial # [id number])  . Arthritis   . Back pain   . Bursitis   . Cataract    left immature  . Cellulitis 10/12/2015  . CHF (congestive heart failure) ([place])   . Complication of anesthesia    Halucinations  . Constipation    takes Miralax daily as well as Senokot daily  . DDD (degenerative disc disease)   . Gout    takes Allopurinol daily  . Heart valve disorder   . History of blood clots 1962   knee  . History of blood transfusion    no abnormal reaction noted  . History of shingles   . Hyperlipidemia    takes Atorvastatin daily  . Hypertension    takes Lisinopril daily  . Joint pain   . Low back pain 01/25/2017  . Morbid obesity ([place])   . Neck pain on left side 01/25/2017  . OSA (obstructive sleep apnea)   . Osteoarthritis   . Peripheral edema    takes Furosemide daily  . Peripheral neuropathy    takes Gabapentin daily  . Peroneal palsy    significant right foot drop  . Persistent atrial fibrillation ([place])   . Pneumonia 25+yrs ago   hx of  . Prostate cancer ([place])   . Prostate cancer ([place]) 02/13/2019  . RBBB 11/09/2018   Noted on EKG  . Shortness of breath   . Swelling of extremity   . Thrombocytopenia ([place])   . Urinary frequency   . Urinary urgency   . Valvular heart disease   . Venous stasis dermatitis    Past Surgical History:  Procedure  Laterality Date  . CARDIAC CATHETERIZATION N/A 05/02/2015   Procedure: Right/Left Heart Cath and Coronary Angiography;  Surgeon: Burnell Blanks, MD;  Location: [place] [facility];  Service: Cardiovascular;  Laterality: N/A;  . CARDIOVERSION N/A 10/27/2018   Procedure: CARDIOVERSION;  Surgeon: Fay Records, MD;  Location: [facility] ENDOSCOPY;  Service: Cardiovascular;  Laterality: N/A;  . COLONOSCOPY N/A 02/07/2013   Procedure: COLONOSCOPY;  Surgeon: Juanita Craver, MD;  Location: [facility] ENDOSCOPY;  Service: Endoscopy;  Laterality: N/A;  . COLONOSCOPY N/A 02/09/2013   Procedure: COLONOSCOPY;  Surgeon: Beryle Beams, MD;  Location: [place];  Service: Endoscopy;  Laterality: N/A;  . ESOPHAGOGASTRODUODENOSCOPY N/A 02/07/2013   Procedure: ESOPHAGOGASTRODUODENOSCOPY (EGD);  Surgeon: Juanita Craver, MD;  Location: [facility] ENDOSCOPY;  Service: Endoscopy;  Laterality: N/A;  . GIVENS CAPSULE STUDY N/A 02/09/2013   Procedure: GIVENS CAPSULE STUDY;  Surgeon: Beryle Beams, MD;  Location: [place];  Service: Endoscopy;  Laterality: N/A;  . HERNIA REPAIR     umbilical hernia  . KNEE SURGERY Right    multiple knee surgeries due to complication of R TKA  . LAMINOTOMY  7829   c6-t2  . PACEMAKER IMPLANT N/A 04/27/2018  Procedure: PACEMAKER IMPLANT;  Surgeon: Constance Haw, MD;  Location: [place] [facility];  Service: Cardiovascular;  Laterality: N/A;  . PROSTATE BIOPSY N/A 04/12/2019   Procedure: BIOPSY TRANSRECTAL ULTRASONIC PROSTATE (TUBP) CYSTOSCOPY;  Surgeon: [name] Hughs, MD;  Location: [facility];  Service: Urology;  Laterality: N/A;  . SPINE SURGERY    . TEE WITHOUT CARDIOVERSION N/A 03/07/2015   Procedure: TRANSESOPHAGEAL ECHOCARDIOGRAM (TEE);  Surgeon: Josue Hector, MD;  Location: [facility] ENDOSCOPY;  Service: Cardiovascular;  Laterality: N/A;  ANES TO BRING PROPOFOL PER DOCTOR  . TEE WITHOUT CARDIOVERSION N/A 06/10/2015   Procedure: TRANSESOPHAGEAL ECHOCARDIOGRAM (TEE);  Surgeon: Burnell Blanks,  MD;  Location: [place];  Service: Open Heart Surgery;  Laterality: N/A;  . TOTAL KNEE ARTHROPLASTY     right   x4 left x 1   . TRANSCATHETER AORTIC VALVE REPLACEMENT, TRANSFEMORAL Left 06/10/2015   Procedure: TRANSCATHETER AORTIC VALVE REPLACEMENT, TRANSFEMORAL;  Surgeon: Burnell Blanks, MD;  Location: [place];  Service: Open Heart Surgery;  Laterality: Left;   Patient Active Problem List   Diagnosis Date Noted  . Abnormal albumin 06/15/2020  . Foot injury, left, sequela 06/15/2020  . Proteins serum plasma low 06/02/2020  . Injury of toe on right foot 05/20/2020  . Chronic respiratory failure with hypoxia ([place]) 02/26/2020  . Coagulation defect ([place]) 12/07/2019  . Malignant neoplasm of prostate ([place]) 05/15/2019  . Pain due to onychomycosis of toenails of both feet 03/13/2019  . PVD (peripheral vascular disease) ([place]) 03/13/2019  . Prostate cancer ([place]) 02/13/2019  . Urinary tract infection 12/16/2018  . Persistent atrial fibrillation ([place]) 10/17/2018  . Primary osteoarthritis of left shoulder 08/23/2018  . Renal insufficiency 06/29/2018  . Bradycardia 04/26/2018  . Other fatigue 12/06/2017  . Shortness of breath on exertion 12/06/2017  . Essential hypertension 12/06/2017  . Hyperglycemia 07/28/2017  . Neck pain on left side 01/25/2017  . Low back pain 01/25/2017  . Constipation 08/15/2015  . Severe aortic valve stenosis   . Medicare annual wellness visit, subsequent 03/09/2015  . Preventative health care 03/09/2015  . Allergic rhinitis 12/02/2014  . COPD mixed type ([place]) 12/02/2014  . Chronic venous insufficiency 10/02/2013  . Venous stasis dermatitis 10/02/2013  . AVM (arteriovenous malformation) of colon with hemorrhage 02/10/2013  . Thrombocytopenia, unspecified ([place]) 02/09/2013  . EDEMA 04/15/2010  . HYPERCHOLESTEROLEMIA 04/14/2010  . Gout 04/14/2010  . Anemia 04/14/2010  . Aortic valve disorder 04/14/2010  . VALVULAR HEART DISEASE 04/14/2010  . Osteoarthritis  04/14/2010  . Multilevel degenerative disc disease 04/14/2010  . BURSITIS 04/14/2010  . OBESITY, MORBID 03/21/2010  . Obstructive sleep apnea 02/19/2008    Current Outpatient Medications:  .  acetaminophen (TYLENOL) 500 MG tablet, Take 1,000 mg by mouth 2 (two) times daily., Disp: , Rfl:  .  albuterol (VENTOLIN HFA) 108 (90 Base) MCG/ACT inhaler, TAKE 2 PUFFS BY MOUTH EVERY 6 HOURS AS NEEDED (Patient not taking: Reported on 09/10/2020), Disp: 18 each, Rfl: 1 .  allopurinol (ZYLOPRIM) 100 MG tablet, Take 1 tablet (100 mg total) by mouth daily., Disp: 90 tablet, Rfl: 3 .  ALLOPURINOL PO, , Disp: , Rfl:  .  atorvastatin (LIPITOR) 40 MG tablet, TAKE 1 AND A HALF TABLET DAILY BY MOUTH (60MG ), Disp: 135 tablet, Rfl: 5 .  Atorvastatin Calcium (LIPITOR PO), , Disp: , Rfl:  .  Cholecalciferol (VITAMIN D3) 125 MCG (5000 UT) CAPS, Take 5,000 Units by mouth daily., Disp: , Rfl:  .  Coenzyme Q10 (COQ10) 100 MG  CAPS, Take 300 mg by mouth daily. , Disp: , Rfl:  .  ELIQUIS 5 MG TABS tablet, TAKE 1 TABLET BY MOUTH TWICE A DAY, Disp: 60 tablet, Rfl: 6 .  Ergocalciferol (VITAMIN D2) 50 MCG (2000 UT) TABS, , Disp: , Rfl:  .  famotidine (PEPCID) 40 MG tablet, TAKE 1 TABLET BY MOUTH EVERY DAY, Disp: 90 tablet, Rfl: 1 .  Ferrous Fumarate-Folic Acid (HEMATINIC/FOLIC ACID) 932-6 MG TABS, Take 1 tablet by mouth daily., Disp: 90 tablet, Rfl: 1 .  Fluticasone-Umeclidin-Vilant (TRELEGY ELLIPTA) 100-62.5-25 MCG/INH AEPB, Inhale 1 puff into the lungs daily., Disp: 60 each, Rfl: 12 .  furosemide (LASIX) 20 MG tablet, Take 1 tablet (20 mg total) by mouth 2 (two) times daily., Disp: 180 tablet, Rfl: 1 .  gabapentin (NEURONTIN) 300 MG capsule, Take 1 capsule (300 mg total) by mouth 3 (three) times daily., Disp: 270 capsule, Rfl: 1 .  GABAPENTIN ENACARBIL ER PO, , Disp: , Rfl:  .  levocetirizine (XYZAL) 5 MG tablet, Take 1 tablet (5 mg total) by mouth every evening. (Patient not taking: Reported on 09/10/2020), Disp: 90 tablet,  Rfl: 3 .  LISINOPRIL PO, , Disp: , Rfl:  .  Misc Natural Products (TART CHERRY ADVANCED PO), Take 1,200 mg by mouth 2 (two) times a day. , Disp: , Rfl:  .  montelukast (SINGULAIR) 10 MG tablet, Take 1 tablet (10 mg total) by mouth at bedtime. (Patient not taking: Reported on 09/10/2020), Disp: 90 tablet, Rfl: 3 .  Multiple Vitamins-Minerals (CENTRUM SILVER PO), Take 1 tablet by mouth daily., Disp: , Rfl:  .  Multiple Vitamins-Minerals (MULTIVITAMIN ADULTS PO), , Disp: , Rfl:  .  OXYCODONE-ASPIRIN PO, , Disp: , Rfl:  .  Phenazopyridine HCl (AZO-STANDARD PO), Take by mouth., Disp: , Rfl:  .  polyethylene glycol (MIRALAX / GLYCOLAX) packet, Take 17 g by mouth daily., Disp: , Rfl:  .  TRAMADOL HCL PO, , Disp: , Rfl:  .  vitamin C (ASCORBIC ACID) 500 MG tablet, Take 500 mg by mouth 2 (two) times daily. , Disp: , Rfl:  Allergies  Allergen Reactions  . Sulfa Antibiotics     Other reaction(s): Unknown  . Coumadin [Warfarin Sodium] Other (See Comments)    States he can't be on this-bleeds out   Social History   Occupational History  . Occupation: retired Nurse, mental health  . Smoking status: Former Smoker    Years: 30.00    Types: Pipe    Quit date: 09/20/2006    Years since quitting: 14.0  . Smokeless tobacco: Never Used  Vaping Use  . Vaping Use: Never used  Substance and Sexual Activity  . Alcohol use: No    Alcohol/week: 0.0 standard drinks  . Drug use: No  . Sexual activity: Not Currently    Comment: lives with wife, retired, no dietary restrictions    Objective:   Constitutional Pt is a pleasant 80 y.o. Caucasian male morbidly obese in NAD. AAO x 3.   Vascular Capillary fill time to digits <3 seconds b/l lower extremities. Nonpalpable DP pulse(s) b/l lower extremities. Nonpalpable PT pulse(s) b/l lower extremities. Pedal hair absent. Lower extremity skin temperature gradient warm to cool. Purple toes noted b/l. No pain with calf compression b/l. +2 pitting edema b/l lower  extremities. Evidence of chronic venous insufficiency b/l LE.  Neurologic Normal speech. Protective sensation diminished with 10g monofilament b/l.  Dermatologic Pedal skin with normal turgor, texture and tone bilaterally. No open wounds bilaterally. No  interdigital macerations bilaterally. Toenails 1-5 b/l elongated, discolored, dystrophic, thickened, crumbly with subungual debris and tenderness to dorsal palpation. There is evidence of subacute subungual hematoma of the R hallux. There is onycholysis of nailplate. There is no  tenderness to palpation.  Orthopedic: Normal muscle strength 5/5 to all lower extremity muscle groups bilaterally. No pain crepitus or joint limitation noted with ROM b/l. No gross bony deformities bilaterally. Utilizes motorized chair for mobility assistance.   Radiographs: None Assessment:   1. Pain due to onychomycosis of toenails of both feet   2. Subungual hematoma of great toe of right foot, initial encounter   3. Coagulation defect [facility])    Plan:  Patient was evaluated and treated and all questions answered.  Onychomycosis with pain -Nails palliatively debridement as below. -Educated on self-care  Procedure: Nail Debridement Rationale: Pain Type of Debridement: manual, sharp debridement. Instrumentation: Nail nipper, rotary burr. Number of Nails: 10  -Examined patient. -Continue diabetic foot care principles. -Toenails 1-5 b/l were debrided in length and girth with sterile nail nippers and dremel without iatrogenic bleeding.  -Loose hallux nailplate gently debrided. Hemostatic agent applied to nailbed with light compressive dressing. He may remove on tomorrow. Apply Neosporin to nailbed once daily until healed. Call office if he has any problems. -Patient to report any pedal injuries to medical professional immediately. -Patient/POA to call should there be question/concern in the interim.  Return in about 3 months (around 01/13/2021).  Marzetta Board, DPM

## 2020-10-22 ENCOUNTER — Telehealth: Payer: Self-pay | Admitting: Family Medicine

## 2020-10-22 ENCOUNTER — Other Ambulatory Visit: Payer: Self-pay

## 2020-10-22 DIAGNOSIS — E7800 Pure hypercholesterolemia, unspecified: Secondary | ICD-10-CM

## 2020-10-22 DIAGNOSIS — G4733 Obstructive sleep apnea (adult) (pediatric): Secondary | ICD-10-CM | POA: Diagnosis not present

## 2020-10-22 DIAGNOSIS — R062 Wheezing: Secondary | ICD-10-CM | POA: Diagnosis not present

## 2020-10-22 DIAGNOSIS — J449 Chronic obstructive pulmonary disease, unspecified: Secondary | ICD-10-CM | POA: Diagnosis not present

## 2020-10-22 DIAGNOSIS — J9622 Acute and chronic respiratory failure with hypercapnia: Secondary | ICD-10-CM | POA: Diagnosis not present

## 2020-10-22 MED ORDER — ATORVASTATIN CALCIUM 80 MG PO TABS: ORAL_TABLET | ORAL | 1 refills | Status: AC

## 2020-10-22 NOTE — Telephone Encounter (Signed)
Pt is aware of labs results and medication was sent in

## 2020-10-22 NOTE — Telephone Encounter (Signed)
Patient is calling in reference to lab results, patient a call back to discuss results and medication changes.

## 2020-11-04 ENCOUNTER — Ambulatory Visit (INDEPENDENT_AMBULATORY_CARE_PROVIDER_SITE_OTHER): Payer: Medicare HMO

## 2020-11-04 DIAGNOSIS — I441 Atrioventricular block, second degree: Secondary | ICD-10-CM

## 2020-11-04 LAB — CUP PACEART REMOTE DEVICE CHECK
Battery Remaining Longevity: 124 mo
Battery Remaining Percentage: 95.5 %
Battery Voltage: 3.01 V
Brady Statistic AP VP Percent: 41 %
Brady Statistic AP VS Percent: 1 %
Brady Statistic AS VP Percent: 45 %
Brady Statistic AS VS Percent: 14 %
Brady Statistic RA Percent Paced: 42 %
Brady Statistic RV Percent Paced: 86 %
Date Time Interrogation Session: 20220215020018
Implantable Lead Implant Date: 20190808
Implantable Lead Implant Date: 20190808
Implantable Lead Location: 753859
Implantable Lead Location: 753860
Implantable Pulse Generator Implant Date: 20190808
Lead Channel Impedance Value: 400 Ohm
Lead Channel Impedance Value: 480 Ohm
Lead Channel Pacing Threshold Amplitude: 0.5 V
Lead Channel Pacing Threshold Amplitude: 0.625 V
Lead Channel Pacing Threshold Pulse Width: 0.5 ms
Lead Channel Pacing Threshold Pulse Width: 0.5 ms
Lead Channel Sensing Intrinsic Amplitude: 12 mV
Lead Channel Sensing Intrinsic Amplitude: 2.6 mV
Lead Channel Setting Pacing Amplitude: 0.875
Lead Channel Setting Pacing Amplitude: 1.5 V
Lead Channel Setting Pacing Pulse Width: 0.5 ms
Lead Channel Setting Sensing Sensitivity: 2.5 mV
Pulse Gen Model: 2272
Pulse Gen Serial Number: 9052893

## 2020-11-10 NOTE — Progress Notes (Signed)
Remote pacemaker transmission.   

## 2020-11-17 DIAGNOSIS — R062 Wheezing: Secondary | ICD-10-CM | POA: Diagnosis not present

## 2020-11-17 DIAGNOSIS — G4733 Obstructive sleep apnea (adult) (pediatric): Secondary | ICD-10-CM | POA: Diagnosis not present

## 2020-11-19 DIAGNOSIS — R062 Wheezing: Secondary | ICD-10-CM | POA: Diagnosis not present

## 2020-11-19 DIAGNOSIS — J449 Chronic obstructive pulmonary disease, unspecified: Secondary | ICD-10-CM | POA: Diagnosis not present

## 2020-11-19 DIAGNOSIS — G4733 Obstructive sleep apnea (adult) (pediatric): Secondary | ICD-10-CM | POA: Diagnosis not present

## 2020-11-19 DIAGNOSIS — J9622 Acute and chronic respiratory failure with hypercapnia: Secondary | ICD-10-CM | POA: Diagnosis not present

## 2020-12-15 DIAGNOSIS — R062 Wheezing: Secondary | ICD-10-CM | POA: Diagnosis not present

## 2020-12-15 DIAGNOSIS — G4733 Obstructive sleep apnea (adult) (pediatric): Secondary | ICD-10-CM | POA: Diagnosis not present

## 2020-12-20 DIAGNOSIS — J449 Chronic obstructive pulmonary disease, unspecified: Secondary | ICD-10-CM | POA: Diagnosis not present

## 2020-12-20 DIAGNOSIS — R062 Wheezing: Secondary | ICD-10-CM | POA: Diagnosis not present

## 2020-12-20 DIAGNOSIS — G4733 Obstructive sleep apnea (adult) (pediatric): Secondary | ICD-10-CM | POA: Diagnosis not present

## 2020-12-20 DIAGNOSIS — J9622 Acute and chronic respiratory failure with hypercapnia: Secondary | ICD-10-CM | POA: Diagnosis not present

## 2020-12-25 ENCOUNTER — Encounter: Payer: Self-pay | Admitting: Family Medicine

## 2020-12-25 ENCOUNTER — Telehealth (INDEPENDENT_AMBULATORY_CARE_PROVIDER_SITE_OTHER): Payer: Medicare HMO | Admitting: Family Medicine

## 2020-12-25 ENCOUNTER — Other Ambulatory Visit: Payer: Self-pay

## 2020-12-25 VITALS — BP 130/69 | HR 65

## 2020-12-25 DIAGNOSIS — J441 Chronic obstructive pulmonary disease with (acute) exacerbation: Secondary | ICD-10-CM

## 2020-12-25 MED ORDER — DOXYCYCLINE HYCLATE 100 MG PO TABS: 100.0000 mg | ORAL_TABLET | Freq: Two times a day (BID) | ORAL | 0 refills | Status: DC

## 2020-12-25 MED ORDER — ALBUTEROL SULFATE HFA 108 (90 BASE) MCG/ACT IN AERS
INHALATION_SPRAY | RESPIRATORY_TRACT | 1 refills | Status: AC
Start: 2020-12-25 — End: ?

## 2020-12-25 MED ORDER — CETIRIZINE HCL 10 MG PO TABS: 10.0000 mg | ORAL_TABLET | Freq: Every day | ORAL | 11 refills | Status: DC

## 2020-12-25 NOTE — Progress Notes (Signed)
Virtual Visit via Video   I connected with patient on 12/25/20 at 11:00 AM EDT by a video enabled telemedicine application and verified that I am speaking with the correct person using two identifiers.  Location patient: Home Location provider: [facility], Office Persons participating in the virtual visit: Patient, Provider, [place] (Sabrina M)  I discussed the limitations of evaluation and management by telemedicine and the availability of in person appointments. The patient expressed understanding and agreed to proceed.  Subjective:   HPI:   URI- 'every year I get this'.  Attributes this to the pollen.  Sxs started Monday w/ raspy voice.  'yesterday it kicked my butt'.  Last night could hear wheezing when he was lying down.  Cough is productive.  Chest sounds wet.  No fevers or chills.  No nausea or vomiting.  Mild SOB.  Has had albuterol in the past.  Pt is on Trelogy.  Not currently on allergy medication at this time.  ROS:   See pertinent positives and negatives per HPI.  Patient Active Problem List   Diagnosis Date Noted  . Abnormal albumin 06/15/2020  . Foot injury, left, sequela 06/15/2020  . Proteins serum plasma low 06/02/2020  . Injury of toe on right foot 05/20/2020  . Chronic respiratory failure with hypoxia ([place]) 02/26/2020  . Coagulation defect ([place]) 12/07/2019  . Malignant neoplasm of prostate ([place]) 05/15/2019  . Pain due to onychomycosis of toenails of both feet 03/13/2019  . PVD (peripheral vascular disease) ([place]) 03/13/2019  . Prostate cancer ([place]) 02/13/2019  . Urinary tract infection 12/16/2018  . Persistent atrial fibrillation ([place]) 10/17/2018  . Primary osteoarthritis of left shoulder 08/23/2018  . Renal insufficiency 06/29/2018  . Bradycardia 04/26/2018  . Other fatigue 12/06/2017  . Shortness of breath on exertion 12/06/2017  . Essential hypertension 12/06/2017  . Hyperglycemia 07/28/2017  . Neck pain on left side 01/25/2017  . Low back  pain 01/25/2017  . Constipation 08/15/2015  . Severe aortic valve stenosis   . Medicare annual wellness visit, subsequent 03/09/2015  . Preventative health care 03/09/2015  . Allergic rhinitis 12/02/2014  . COPD mixed type ([place]) 12/02/2014  . Chronic venous insufficiency 10/02/2013  . Venous stasis dermatitis 10/02/2013  . AVM (arteriovenous malformation) of colon with hemorrhage 02/10/2013  . Thrombocytopenia, unspecified ([place]) 02/09/2013  . EDEMA 04/15/2010  . HYPERCHOLESTEROLEMIA 04/14/2010  . Gout 04/14/2010  . Anemia 04/14/2010  . Aortic valve disorder 04/14/2010  . VALVULAR HEART DISEASE 04/14/2010  . Osteoarthritis 04/14/2010  . Multilevel degenerative disc disease 04/14/2010  . BURSITIS 04/14/2010  . OBESITY, MORBID 03/21/2010  . Obstructive sleep apnea 02/19/2008    Social History   Tobacco Use  . Smoking status: Former Smoker    Years: 30.00    Types: Pipe    Quit date: 09/20/2006    Years since quitting: 14.2  . Smokeless tobacco: Never Used  Substance Use Topics  . Alcohol use: No    Alcohol/week: 0.0 standard drinks    Current Outpatient Medications:  .  acetaminophen (TYLENOL) 500 MG tablet, Take 1,000 mg by mouth 2 (two) times daily., Disp: , Rfl:  .  allopurinol (ZYLOPRIM) 100 MG tablet, Take 1 tablet (100 mg total) by mouth daily., Disp: 90 tablet, Rfl: 3 .  atorvastatin (LIPITOR) 80 MG tablet, 1 tablet by mouth daily, Disp: 90 tablet, Rfl: 1 .  Cholecalciferol (VITAMIN D3) 125 MCG (5000 UT) CAPS, Take 5,000 Units by mouth daily., Disp: , Rfl:  .  Coenzyme Q10 (  COQ10) 100 MG CAPS, Take 300 mg by mouth daily. , Disp: , Rfl:  .  ELIQUIS 5 MG TABS tablet, TAKE 1 TABLET BY MOUTH TWICE A DAY, Disp: 60 tablet, Rfl: 6 .  Ergocalciferol (VITAMIN D2) 50 MCG (2000 UT) TABS, , Disp: , Rfl:  .  famotidine (PEPCID) 40 MG tablet, TAKE 1 TABLET BY MOUTH EVERY DAY, Disp: 90 tablet, Rfl: 1 .  Ferrous Fumarate-Folic Acid (HEMATINIC/FOLIC ACID) 833-8 MG TABS, Take 1 tablet  by mouth daily., Disp: 90 tablet, Rfl: 1 .  Fluticasone-Umeclidin-Vilant (TRELEGY ELLIPTA) 100-62.5-25 MCG/INH AEPB, Inhale 1 puff into the lungs daily., Disp: 60 each, Rfl: 12 .  furosemide (LASIX) 20 MG tablet, Take 1 tablet (20 mg total) by mouth 2 (two) times daily., Disp: 180 tablet, Rfl: 1 .  gabapentin (NEURONTIN) 300 MG capsule, Take 1 capsule (300 mg total) by mouth 3 (three) times daily., Disp: 270 capsule, Rfl: 1 .  Misc Natural Products (TART CHERRY ADVANCED PO), Take 1,200 mg by mouth 2 (two) times a day. , Disp: , Rfl:  .  Multiple Vitamins-Minerals (CENTRUM SILVER PO), Take 1 tablet by mouth daily., Disp: , Rfl:  .  Multiple Vitamins-Minerals (MULTIVITAMIN ADULTS PO), , Disp: , Rfl:  .  Phenazopyridine HCl (AZO-STANDARD PO), Take by mouth., Disp: , Rfl:  .  polyethylene glycol (MIRALAX / GLYCOLAX) packet, Take 17 g by mouth daily., Disp: , Rfl:  .  vitamin C (ASCORBIC ACID) 500 MG tablet, Take 500 mg by mouth 2 (two) times daily. , Disp: , Rfl:  .  albuterol (VENTOLIN HFA) 108 (90 Base) MCG/ACT inhaler, TAKE 2 PUFFS BY MOUTH EVERY 6 HOURS AS NEEDED (Patient not taking: No sig reported), Disp: 18 each, Rfl: 1 .  ALLOPURINOL PO, , Disp: , Rfl:  .  GABAPENTIN ENACARBIL ER PO, , Disp: , Rfl:  .  levocetirizine (XYZAL) 5 MG tablet, Take 1 tablet (5 mg total) by mouth every evening. (Patient not taking: No sig reported), Disp: 90 tablet, Rfl: 3 .  LISINOPRIL PO, , Disp: , Rfl:  .  montelukast (SINGULAIR) 10 MG tablet, Take 1 tablet (10 mg total) by mouth at bedtime. (Patient not taking: No sig reported), Disp: 90 tablet, Rfl: 3 .  OXYCODONE-ASPIRIN PO, , Disp: , Rfl:  .  TRAMADOL HCL PO, , Disp: , Rfl:   Allergies  Allergen Reactions  . Sulfa Antibiotics     Other reaction(s): Unknown  . Coumadin [Warfarin Sodium] Other (See Comments)    States he can't be on this-bleeds out    Objective:   BP 130/69   Pulse 65   SpO2 91%   AAOx3, NAD NCAT, EOMI No obvious CN  deficits Coloring WNL Pt is able to speak clearly, coherently without shortness of breath or increased work of breathing.  Wet rattle heard when breathing Thought process is linear.  Mood is appropriate.   Assessment and Plan:   COPD Exacerbation- new to provider.  Pt reports similar situation every year when the pollen count rises.  He is not currently taking any allergy medication- prescription sent for Zyrtec.  Will send Albuterol inhaler for the wheezing.  Start Doxycycline to treat possible bacterial superinfxn due to airway inflammation.  Encouraged OTC Mucinex DM.  Reviewed supportive care and red flags that should prompt return.  Pt expressed understanding and is in agreement w/ plan.   Annye Asa, MD 12/25/2020

## 2020-12-25 NOTE — Progress Notes (Signed)
I connected with  [name] on 12/25/20 by a video enabled telemedicine application and verified that I am speaking with the correct person using two identifiers.   I discussed the limitations of evaluation and management by telemedicine. The patient expressed understanding and agreed to proceed.

## 2021-01-09 ENCOUNTER — Other Ambulatory Visit: Payer: Self-pay

## 2021-01-09 ENCOUNTER — Other Ambulatory Visit: Payer: Self-pay | Admitting: *Deleted

## 2021-01-09 ENCOUNTER — Other Ambulatory Visit (INDEPENDENT_AMBULATORY_CARE_PROVIDER_SITE_OTHER): Payer: Medicare HMO

## 2021-01-09 DIAGNOSIS — E7800 Pure hypercholesterolemia, unspecified: Secondary | ICD-10-CM | POA: Diagnosis not present

## 2021-01-09 DIAGNOSIS — I10 Essential (primary) hypertension: Secondary | ICD-10-CM

## 2021-01-09 DIAGNOSIS — N289 Disorder of kidney and ureter, unspecified: Secondary | ICD-10-CM

## 2021-01-09 DIAGNOSIS — D649 Anemia, unspecified: Secondary | ICD-10-CM

## 2021-01-09 LAB — COMPREHENSIVE METABOLIC PANEL
ALT: 17 U/L (ref 0–53)
AST: 22 U/L (ref 0–37)
Albumin: 3.6 g/dL (ref 3.5–5.2)
Alkaline Phosphatase: 70 U/L (ref 39–117)
BUN: 24 mg/dL — ABNORMAL HIGH (ref 6–23)
CO2: 38 mEq/L — ABNORMAL HIGH (ref 19–32)
Calcium: 9.6 mg/dL (ref 8.4–10.5)
Chloride: 97 mEq/L (ref 96–112)
Creatinine, Ser: 1.13 mg/dL (ref 0.40–1.50)
GFR: 62.28 mL/min (ref >60.00)
Glucose, Bld: 87 mg/dL (ref 70–99)
Potassium: 4.4 mEq/L (ref 3.5–5.1)
Sodium: 141 mEq/L (ref 135–145)
Total Bilirubin: 0.6 mg/dL (ref 0.2–1.2)
Total Protein: 5.8 g/dL — ABNORMAL LOW (ref 6.0–8.3)

## 2021-01-09 LAB — CBC
HCT: 36.3 % — ABNORMAL LOW (ref 39.0–52.0)
Hemoglobin: 11.9 g/dL — ABNORMAL LOW (ref 13.0–17.0)
MCHC: 32.8 g/dL (ref 30.0–36.0)
MCV: 99 fl (ref 78.0–100.0)
Platelets: 124 10*3/uL — ABNORMAL LOW (ref 150.0–400.0)
RBC: 3.67 Mil/uL — ABNORMAL LOW (ref 4.22–5.81)
RDW: 18 % — ABNORMAL HIGH (ref 11.5–15.5)
WBC: 5.8 10*3/uL (ref 4.0–10.5)

## 2021-01-09 LAB — LIPID PANEL
Cholesterol: 116 mg/dL (ref 0–200)
HDL: 27.9 mg/dL — ABNORMAL LOW (ref >39.00)
LDL Cholesterol: 57 mg/dL (ref 0–99)
NonHDL: 88.03
Total CHOL/HDL Ratio: 4
Triglycerides: 153 mg/dL — ABNORMAL HIGH (ref 0.0–149.0)
VLDL: 30.6 mg/dL (ref 0.0–40.0)

## 2021-01-12 ENCOUNTER — Other Ambulatory Visit: Payer: Self-pay | Admitting: *Deleted

## 2021-01-12 ENCOUNTER — Other Ambulatory Visit: Payer: Medicare HMO

## 2021-01-12 DIAGNOSIS — D539 Nutritional anemia, unspecified: Secondary | ICD-10-CM

## 2021-01-13 ENCOUNTER — Ambulatory Visit (INDEPENDENT_AMBULATORY_CARE_PROVIDER_SITE_OTHER): Payer: Medicare HMO | Admitting: Family Medicine

## 2021-01-13 ENCOUNTER — Other Ambulatory Visit: Payer: Self-pay

## 2021-01-13 VITALS — BP 119/65 | HR 67 | Temp 98.5°F | Resp 20

## 2021-01-13 DIAGNOSIS — N289 Disorder of kidney and ureter, unspecified: Secondary | ICD-10-CM

## 2021-01-13 DIAGNOSIS — R739 Hyperglycemia, unspecified: Secondary | ICD-10-CM | POA: Diagnosis not present

## 2021-01-13 DIAGNOSIS — I4819 Other persistent atrial fibrillation: Secondary | ICD-10-CM

## 2021-01-13 DIAGNOSIS — M1A9XX0 Chronic gout, unspecified, without tophus (tophi): Secondary | ICD-10-CM | POA: Diagnosis not present

## 2021-01-13 DIAGNOSIS — E7800 Pure hypercholesterolemia, unspecified: Secondary | ICD-10-CM

## 2021-01-13 DIAGNOSIS — D649 Anemia, unspecified: Secondary | ICD-10-CM

## 2021-01-13 DIAGNOSIS — E538 Deficiency of other specified B group vitamins: Secondary | ICD-10-CM | POA: Diagnosis not present

## 2021-01-13 DIAGNOSIS — I10 Essential (primary) hypertension: Secondary | ICD-10-CM

## 2021-01-13 NOTE — Patient Instructions (Signed)
Allergies, Adult An allergy means that your body reacts to something that bothers it (allergen). This can happen from something that you eat, breathe in, or touch. Allergies often affect the nose, eyes, skin, and stomach. They can be mild, moderate, or very bad (severe). An allergy cannot spread from person to person. They can happen at any age. Sometimes, people outgrow them. What are the causes?  Outdoor things, such as pollen, car fumes, and mold.  Indoor things, such as dust, smoke, mold, and pets.  Foods.  Medicines.  Things that bother your skin, such as perfume and bug bites. What increases the risk?  Having family members with allergies or asthma. What are the signs or symptoms? Symptoms depend on how bad your allergy is. Mild to moderate symptoms  Runny nose, stuffy nose, or sneezing.  Itchy mouth, ears, or throat.  A feeling of mucus dripping down the back of your throat.  Sore throat.  Eyes that are itchy, red, watery, or puffy.  A skin rash, or red, swollen areas of skin (hives).  Stomach cramps or bloating. Severe symptoms Very bad allergies to food, medicine, or bug bites may cause a very bad allergy reaction (anaphylaxis). This can be life-threatening. Symptoms include:  A red face.  Wheezing or coughing.  Swollen lips, tongue, or mouth.  Tight or swollen throat.  Chest pain or tightness, or a fast heartbeat.  Trouble breathing or shortness of breath.  Pain in your belly (abdomen), vomiting, or watery poop (diarrhea).  Feeling dizzy or fainting. How is this treated? Treatment for this condition depends on your symptoms. Treatment may include:  Cold, wet cloths for itching and swelling.  Eye drops, nose sprays, or skin creams.  Washing out your nose each day.  A humidifier.  Medicines.  A change to the foods you eat.  Being exposed again and again to tiny amounts of allergens. This helps your body get used to them. You might  have: ? Allergy shots. ? Very small amounts of allergen put under your tongue.  An emergency shot (auto-injector pen) if you have a very bad allergy reaction. ? This is a medicine with a needle. You can put it into your skin by yourself. ? Your doctor will teach you how to use it.      Follow these instructions at home: Medicines  Take or apply over-the-counter and prescription medicines only as told by your doctor.  If you are at risk for a very bad allergy reaction, keep an auto-injector pen with you all the time.   Eating and drinking  Follow instructions from your doctor about what to eat and drink.  Drink enough fluid to keep your pee (urine) pale yellow. General instructions  If you have ever had a very bad allergy reaction, wear a medical alert bracelet or necklace.  Stay away from things that you are allergic to.  Keep all follow-up visits as told by your doctor. This is important. Contact a doctor if:  Your symptoms do not get better with treatment. Get help right away if:  You have symptoms of a very bad allergy reaction. These include: ? A swollen mouth, tongue, or throat. ? Pain or tightness in your chest. ? Trouble breathing. ? Being short of breath. ? Dizziness. ? Fainting. ? Very bad pain in your belly. ? Vomiting. ? Watery poop. These symptoms may be an emergency. Do not wait to see if the symptoms will go away. Get medical help right away. Call your local   emergency services (911 in the [place] Do not drive yourself to the hospital. Summary  Take or apply over-the-counter and prescription medicines only as told by your doctor.  Stay away from things you are allergic to.  If you are at risk for a very bad allergy reaction, carry an auto-injector pen all the time.  Wear a medical alert bracelet or necklace.  Very bad allergy reactions can be life-threatening. Get help right away. This information is not intended to replace advice given to you by your  health care provider. Make sure you discuss any questions you have with your health care provider. Document Revised: 07/18/2019 Document Reviewed: 07/18/2019 Elsevier Patient Education  2021 [employer]  

## 2021-01-14 LAB — VITAMIN B12: Vitamin B-12: 966 pg/mL (ref 200–1100)

## 2021-01-14 LAB — INTRINSIC FACTOR ANTIBODIES: Intrinsic Factor: NEGATIVE

## 2021-01-15 DIAGNOSIS — G4733 Obstructive sleep apnea (adult) (pediatric): Secondary | ICD-10-CM | POA: Diagnosis not present

## 2021-01-15 DIAGNOSIS — R062 Wheezing: Secondary | ICD-10-CM | POA: Diagnosis not present

## 2021-01-16 NOTE — Assessment & Plan Note (Signed)
hgba1c acceptable, minimize simple carbs. Increase exercise as tolerated.  

## 2021-01-16 NOTE — Progress Notes (Signed)
Subjective:    Patient ID: [name], male    DOB: 1941-09-18, 80 y.o.   MRN: [id number]  Chief Complaint  Patient presents with  . Follow-up  . Hyperlipidemia    Pt has no concerns or problems    HPI Patient is in today for follow up on chronic medical concerns. No recent febrile illness or hospitalizations. He is accompanied by his wife. He is doing well at home. No acute concerns. Is trying to maintain a heart healthy diet. Denies CP/palp/SOB/HA/congestion/fevers/GI or GU c/o. Taking meds as prescribed  Past Medical History:  Diagnosis Date  . 1st degree AV block 11/09/2018   Noted on EKG   . Anemia   . Aortic stenosis, severe    S/p Edwards Sapien 3 Transcatheter Heart Valve (size 26 mm, model # [id number], serial # [id number])  . Arthritis   . Back pain   . Bursitis   . Cataract    left immature  . Cellulitis 10/12/2015  . CHF (congestive heart failure) ([place])   . Complication of anesthesia    Halucinations  . Constipation    takes Miralax daily as well as Senokot daily  . DDD (degenerative disc disease)   . Gout    takes Allopurinol daily  . Heart valve disorder   . History of blood clots 1962   knee  . History of blood transfusion    no abnormal reaction noted  . History of shingles   . Hyperlipidemia    takes Atorvastatin daily  . Hypertension    takes Lisinopril daily  . Joint pain   . Low back pain 01/25/2017  . Morbid obesity ([place])   . Neck pain on left side 01/25/2017  . OSA (obstructive sleep apnea)   . Osteoarthritis   . Peripheral edema    takes Furosemide daily  . Peripheral neuropathy    takes Gabapentin daily  . Peroneal palsy    significant right foot drop  . Persistent atrial fibrillation ([place])   . Pneumonia 25+yrs ago   hx of  . Prostate cancer ([place])   . Prostate cancer ([place]) 02/13/2019  . RBBB 11/09/2018   Noted on EKG  . Shortness of breath   . Swelling of extremity   . Thrombocytopenia ([place])   . Urinary frequency   . Urinary  urgency   . Valvular heart disease   . Venous stasis dermatitis     Past Surgical History:  Procedure Laterality Date  . CARDIAC CATHETERIZATION N/A 05/02/2015   Procedure: Right/Left Heart Cath and Coronary Angiography;  Surgeon: Burnell Blanks, MD;  Location: [place] CV LAB;  Service: Cardiovascular;  Laterality: N/A;  . CARDIOVERSION N/A 10/27/2018   Procedure: CARDIOVERSION;  Surgeon: Fay Records, MD;  Location[facility] ENDOSCOPY;  Service: Cardiovascular;  Laterality: N/A;  . COLONOSCOPY N/A 02/07/2013   Procedure: COLONOSCOPY;  Surgeon: Juanita Craver, MD;  Location: [facility] ENDOSCOPY;  Service: Endoscopy;  Laterality: N/A;  . COLONOSCOPY N/A 02/09/2013   Procedure: COLONOSCOPY;  Surgeon: Beryle Beams, MD;  Location: [place];  Service: Endoscopy;  Laterality: N/A;  . ESOPHAGOGASTRODUODENOSCOPY N/A 02/07/2013   Procedure: ESOPHAGOGASTRODUODENOSCOPY (EGD);  Surgeon: Juanita Craver, MD;  Location: [facility] ENDOSCOPY;  Service: Endoscopy;  Laterality: N/A;  . GIVENS CAPSULE STUDY N/A 02/09/2013   Procedure: GIVENS CAPSULE STUDY;  Surgeon: Beryle Beams, MD;  Location: [place];  Service: Endoscopy;  Laterality: N/A;  . HERNIA REPAIR     umbilical hernia  . KNEE SURGERY Right  multiple knee surgeries due to complication of R TKA  . LAMINOTOMY  9528   c6-t2  . PACEMAKER IMPLANT N/A 04/27/2018   Procedure: PACEMAKER IMPLANT;  Surgeon: Constance Haw, MD;  Location: [place] CV LAB;  Service: Cardiovascular;  Laterality: N/A;  . PROSTATE BIOPSY N/A 04/12/2019   Procedure: BIOPSY TRANSRECTAL ULTRASONIC PROSTATE (TUBP) CYSTOSCOPY;  Surgeon: [name] Hughs, MD;  Location: [facility] OR[facility];  Service: Urology;  Laterality: N/A;  . SPINE SURGERY    . TEE WITHOUT CARDIOVERSION N/A 03/07/2015   Procedure: TRANSESOPHAGEAL ECHOCARDIOGRAM (TEE);  Surgeon: Josue Hector, MD;  Location: [facility] ENDOSCOPY;  Service: Cardiovascular;  Laterality: N/A;  ANES TO BRING PROPOFOL PER DOCTOR  . TEE WITHOUT  CARDIOVERSION N/A 06/10/2015   Procedure: TRANSESOPHAGEAL ECHOCARDIOGRAM (TEE);  Surgeon: Burnell Blanks, MD;  Location: [place];  Service: Open Heart Surgery;  Laterality: N/A;  . TOTAL KNEE ARTHROPLASTY     right   x4 left x 1   . TRANSCATHETER AORTIC VALVE REPLACEMENT, TRANSFEMORAL Left 06/10/2015   Procedure: TRANSCATHETER AORTIC VALVE REPLACEMENT, TRANSFEMORAL;  Surgeon: Burnell Blanks, MD;  Location: [place];  Service: Open Heart Surgery;  Laterality: Left;    Family History  Problem Relation Age of Onset  . Other Father        POSSIBLE HEART ATTACK  . Arthritis Sister        "crippling"  . Prostate cancer Brother   . Arthritis Sister   . Breast cancer Neg Hx   . Colon cancer Neg Hx     Social History   Socioeconomic History  . Marital status: Married    Spouse name: [name]  . Number of children: 3  . Years of education: Not on file  . Highest education level: Not on file  Occupational History  . Occupation: retired Nurse, mental health  . Smoking status: Former Smoker    Years: 30.00    Types: Pipe    Quit date: 09/20/2006    Years since quitting: 14.3  . Smokeless tobacco: Never Used  Vaping Use  . Vaping Use: Never used  Substance and Sexual Activity  . Alcohol use: No    Alcohol/week: 0.0 standard drinks  . Drug use: No  . Sexual activity: Not Currently    Comment: lives with wife, retired, no dietary restrictions  Other Topics Concern  . Not on file  Social History Narrative  . Not on file   Social Determinants of Health   Financial Resource Strain: Low Risk   . Difficulty of Paying Living Expenses: Not hard at all  Food Insecurity: No Food Insecurity  . Worried About Charity fundraiser in the Last Year: Never true  . Ran Out of Food in the Last Year: Never true  Transportation Needs: No Transportation Needs  . Lack of Transportation (Medical): No  . Lack of Transportation (Non-Medical): No  Physical Activity: Not on file  Stress:  Not on file  Social Connections: Not on file  Intimate Partner Violence: Not on file    Outpatient Medications Prior to Visit  Medication Sig Dispense Refill  . acetaminophen (TYLENOL) 500 MG tablet Take 1,000 mg by mouth 2 (two) times daily.    [name] albuterol (VENTOLIN HFA) 108 (90 Base) MCG/ACT inhaler TAKE 2 PUFFS BY MOUTH EVERY 6 HOURS AS NEEDED 18 each 1  . allopurinol (ZYLOPRIM) 100 MG tablet Take 1 tablet (100 mg total) by mouth daily. 90 tablet 3  . atorvastatin (LIPITOR) 80  MG tablet 1 tablet by mouth daily 90 tablet 1  . cetirizine (ZYRTEC) 10 MG tablet Take 1 tablet (10 mg total) by mouth daily. 30 tablet 11  . Cholecalciferol (VITAMIN D3) 125 MCG (5000 UT) CAPS Take 5,000 Units by mouth daily.    . Coenzyme Q10 (COQ10) 100 MG CAPS Take 300 mg by mouth daily.     [name] COVID-19 mRNA vaccine, Moderna, 100 MCG/0.5ML injection INJECT AS DIRECTED .25 mL 0  . doxycycline (VIBRA-TABS) 100 MG tablet Take 1 tablet (100 mg total) by mouth 2 (two) times daily. 14 tablet 0  . ELIQUIS 5 MG TABS tablet TAKE 1 TABLET BY MOUTH TWICE A DAY 60 tablet 6  . Ergocalciferol (VITAMIN D2) 50 MCG (2000 UT) TABS     . famotidine (PEPCID) 40 MG tablet TAKE 1 TABLET BY MOUTH EVERY DAY 90 tablet 1  . Ferrous Fumarate-Folic Acid (HEMATINIC/FOLIC ACID) 253-6 MG TABS Take 1 tablet by mouth daily. 90 tablet 1  . Fluticasone-Umeclidin-Vilant (TRELEGY ELLIPTA) 100-62.5-25 MCG/INH AEPB Inhale 1 puff into the lungs daily. 60 each 12  . furosemide (LASIX) 20 MG tablet Take 1 tablet (20 mg total) by mouth 2 (two) times daily. 180 tablet 1  . gabapentin (NEURONTIN) 300 MG capsule Take 1 capsule (300 mg total) by mouth 3 (three) times daily. 270 capsule 1  . Misc Natural Products (TART CHERRY ADVANCED PO) Take 1,200 mg by mouth 2 (two) times a day.     . Multiple Vitamins-Minerals (CENTRUM SILVER PO) Take 1 tablet by mouth daily.    . Multiple Vitamins-Minerals (MULTIVITAMIN ADULTS PO)     . Phenazopyridine HCl (AZO-STANDARD  PO) Take by mouth.    . polyethylene glycol (MIRALAX / GLYCOLAX) packet Take 17 g by mouth daily.    . vitamin C (ASCORBIC ACID) 500 MG tablet Take 500 mg by mouth 2 (two) times daily.      No facility-administered medications prior to visit.    Allergies  Allergen Reactions  . Sulfa Antibiotics     Other reaction(s): Unknown  . Coumadin [Warfarin Sodium] Other (See Comments)    States he can't be on this-bleeds out    Review of Systems  Constitutional: Positive for malaise/fatigue. Negative for fever.  HENT: Negative for congestion.   Eyes: Negative for blurred vision.  Respiratory: Positive for shortness of breath.   Cardiovascular: Negative for chest pain, palpitations and leg swelling.  Gastrointestinal: Negative for abdominal pain, blood in stool and nausea.  Genitourinary: Negative for dysuria and frequency.  Musculoskeletal: Positive for joint pain. Negative for falls.  Skin: Negative for rash.  Neurological: Negative for dizziness, loss of consciousness and headaches.  Endo/Heme/Allergies: Negative for environmental allergies.  Psychiatric/Behavioral: Negative for depression. The patient is not nervous/anxious.        Objective:    Physical Exam Vitals and nursing note reviewed.  Constitutional:      General: He is not in acute distress.    Appearance: He is well-developed. He is obese.  HENT:     Head: Normocephalic and atraumatic.     Nose: Nose normal.  Eyes:     General:        Right eye: No discharge.        Left eye: No discharge.  Cardiovascular:     Rate and Rhythm: Normal rate and regular rhythm.     Heart sounds: No murmur heard.   Pulmonary:     Effort: Pulmonary effort is normal.     Breath  sounds: Normal breath sounds.  Abdominal:     General: Bowel sounds are normal.     Palpations: Abdomen is soft.     Tenderness: There is no abdominal tenderness.  Musculoskeletal:     Cervical back: Normal range of motion and neck supple.     Right  lower leg: Edema present.     Left lower leg: Edema present.  Skin:    General: Skin is warm and dry.  Neurological:     Mental Status: He is alert and oriented to person, place, and time.     BP 119/65   Pulse 67   Temp 98.5 F (36.9 C)   Resp 20   SpO2 91%  Wt Readings from Last 3 Encounters:  09/08/20 (!) 385 lb (174.6 kg)  05/08/20 (!) 400 lb (181.4 kg)  04/25/20 (!) 370 lb (167.8 kg)    Diabetic Foot Exam - Simple   No data filed    Lab Results  Component Value Date   WBC 5.8 01/09/2021   HGB 11.9 (L) 01/09/2021   HCT 36.3 (L) 01/09/2021   PLT 124.0 (L) 01/09/2021   GLUCOSE 87 01/09/2021   CHOL 116 01/09/2021   TRIG 153.0 (H) 01/09/2021   HDL 27.90 (L) 01/09/2021   LDLDIRECT 94.0 10/09/2020   LDLCALC 57 01/09/2021   ALT 17 01/09/2021   AST 22 01/09/2021   NA 141 01/09/2021   K 4.4 01/09/2021   CL 97 01/09/2021   CREATININE 1.13 01/09/2021   BUN 24 (H) 01/09/2021   CO2 38 (H) 01/09/2021   TSH 1.64 10/09/2020   PSA 24.63 (H) 03/19/2019   INR 1.32 06/10/2015   HGBA1C 5.6 10/09/2020    Lab Results  Component Value Date   TSH 1.64 10/09/2020   Lab Results  Component Value Date   WBC 5.8 01/09/2021   HGB 11.9 (L) 01/09/2021   HCT 36.3 (L) 01/09/2021   MCV 99.0 01/09/2021   PLT 124.0 (L) 01/09/2021   Lab Results  Component Value Date   NA 141 01/09/2021   K 4.4 01/09/2021   CO2 38 (H) 01/09/2021   GLUCOSE 87 01/09/2021   BUN 24 (H) 01/09/2021   CREATININE 1.13 01/09/2021   BILITOT 0.6 01/09/2021   ALKPHOS 70 01/09/2021   AST 22 01/09/2021   ALT 17 01/09/2021   PROT 5.8 (L) 01/09/2021   ALBUMIN 3.6 01/09/2021   CALCIUM 9.6 01/09/2021   ANIONGAP 10 04/20/2020   GFR 62.28 01/09/2021   Lab Results  Component Value Date   CHOL 116 01/09/2021   Lab Results  Component Value Date   HDL 27.90 (L) 01/09/2021   Lab Results  Component Value Date   LDLCALC 57 01/09/2021   Lab Results  Component Value Date   TRIG 153.0 (H) 01/09/2021    Lab Results  Component Value Date   CHOLHDL 4 01/09/2021   Lab Results  Component Value Date   HGBA1C 5.6 10/09/2020       Assessment & Plan:   Problem List Items Addressed This Visit    HYPERCHOLESTEROLEMIA - Primary    Encouraged heart healthy diet, increase exercise, avoid trans fats, consider a krill oil cap daily. Tolerating Atorvastatin      Relevant Orders   Lipid panel   Gout    No recent flare, hydrate well and avoid offending foods, no changes      Relevant Orders   Uric acid   Anemia   Hyperglycemia    hgba1c acceptable, minimize  simple carbs. Increase exercise as tolerated.       Relevant Orders   Hemoglobin A1c   Essential hypertension   Relevant Orders   CBC with Differential/Platelet   Comprehensive metabolic panel   TSH   Renal insufficiency    Hydrate and monitor      Persistent atrial fibrillation (HCC)    Tolerating Eliquis and rate controlled       Other Visit Diagnoses    Vitamin B 12 deficiency       Relevant Orders   Vitamin B12      I am having [place] D. Hedges maintain his acetaminophen, CoQ10, polyethylene glycol, Misc Natural Products (TART CHERRY ADVANCED PO), vitamin C, Multiple Vitamins-Minerals (CENTRUM SILVER PO), Vitamin D3, Phenazopyridine HCl (AZO-STANDARD PO), Trelegy Ellipta, Hematinic/Folic Acid, Eliquis, famotidine, allopurinol, furosemide, gabapentin, Vitamin D2, Multiple Vitamins-Minerals (MULTIVITAMIN ADULTS PO), atorvastatin, albuterol, cetirizine, doxycycline, and COVID-19 mRNA vaccine (Moderna).  No orders of the defined types were placed in this encounter.    Penni Homans, MD

## 2021-01-16 NOTE — Assessment & Plan Note (Signed)
Encouraged heart healthy diet, increase exercise, avoid trans fats, consider a krill oil cap daily. Tolerating Atorvastatin 

## 2021-01-16 NOTE — Assessment & Plan Note (Signed)
Hydrate and monitor 

## 2021-01-16 NOTE — Assessment & Plan Note (Signed)
No recent flare, hydrate well and avoid offending foods, no changes

## 2021-01-16 NOTE — Assessment & Plan Note (Signed)
Tolerating Eliquis and rate controlled 

## 2021-01-19 DIAGNOSIS — J449 Chronic obstructive pulmonary disease, unspecified: Secondary | ICD-10-CM | POA: Diagnosis not present

## 2021-01-19 DIAGNOSIS — J9622 Acute and chronic respiratory failure with hypercapnia: Secondary | ICD-10-CM | POA: Diagnosis not present

## 2021-01-19 DIAGNOSIS — G4733 Obstructive sleep apnea (adult) (pediatric): Secondary | ICD-10-CM | POA: Diagnosis not present

## 2021-01-19 DIAGNOSIS — R062 Wheezing: Secondary | ICD-10-CM | POA: Diagnosis not present

## 2021-01-27 ENCOUNTER — Ambulatory Visit: Payer: Medicare HMO | Admitting: Podiatry

## 2021-01-27 ENCOUNTER — Other Ambulatory Visit: Payer: Self-pay

## 2021-01-27 ENCOUNTER — Encounter: Payer: Self-pay | Admitting: Podiatry

## 2021-01-27 DIAGNOSIS — M79675 Pain in left toe(s): Secondary | ICD-10-CM

## 2021-01-27 DIAGNOSIS — M79674 Pain in right toe(s): Secondary | ICD-10-CM | POA: Diagnosis not present

## 2021-01-27 DIAGNOSIS — I872 Venous insufficiency (chronic) (peripheral): Secondary | ICD-10-CM

## 2021-01-27 DIAGNOSIS — I739 Peripheral vascular disease, unspecified: Secondary | ICD-10-CM | POA: Diagnosis not present

## 2021-01-27 DIAGNOSIS — B351 Tinea unguium: Secondary | ICD-10-CM

## 2021-01-27 DIAGNOSIS — D689 Coagulation defect, unspecified: Secondary | ICD-10-CM

## 2021-01-29 DIAGNOSIS — C61 Malignant neoplasm of prostate: Secondary | ICD-10-CM | POA: Diagnosis not present

## 2021-02-01 NOTE — Progress Notes (Signed)
  Subjective:  Patient ID: [name], male    DOB: 1940-10-20,  MRN: [id number]  [name] presents to clinic today for at risk foot care with h/o clotting disorder and painful thick toenails that are difficult to trim. Pain interferes with ambulation. Aggravating factors include wearing enclosed shoe gear. Pain is relieved with periodic professional debridement.   He voices no new pedal problems on today's visit. PCP is Dr. Penni Homans and last visit was 01/13/2021.  Allergies  Allergen Reactions  . Sulfa Antibiotics     Other reaction(s): Unknown  . Coumadin [Warfarin Sodium] Other (See Comments)    States he can't be on this-bleeds out    Review of Systems: Negative except as noted in the HPI. Objective:   Constitutional [name] is a pleasant 80 y.o. Caucasian male, morbidly obese in NAD. AAO x 3.   Vascular Capillary refill time to remaining digits <3 seconds. Nonpalpable DP pulse(s) b/l lower extremities. Nonpalpable PT pulse(s) b/l lower extremities. Pedal hair absent. Lower extremity skin temperature gradient warm to cool. No pain with calf compression b/l. +2 pitting edema b/l lower extremities. Evidence of chronic venous insufficiency b/l lower extremities. Digits are purple b/l, but blanchable. No ischemia or gangrene noted.  Neurologic Normal speech. Oriented to person, place, and time. Protective sensation diminished with 10g monofilament b/l.  Dermatologic Pedal skin with normal turgor, texture and tone bilaterally. No open wounds bilaterally. No interdigital macerations bilaterally. Toenails 2-5 bilaterally elongated, discolored, dystrophic, thickened, and crumbly with subungual debris and tenderness to dorsal palpation. Anonychia noted R hallux. Nailbed(s) epithelialized.   Orthopedic: Normal muscle strength 5/5 to all lower extremity muscle groups bilaterally. No pain crepitus or joint limitation noted with ROM b/l. Utilizes motorized chair for  mobility assistance.   Radiographs: None Assessment:   1. Pain due to onychomycosis of toenails of both feet   2. Chronic venous insufficiency   3. Coagulation defect ([place])   4. PVD (peripheral vascular disease) ([place])    Plan:  Patient was evaluated and treated and all questions answered.  Onychomycosis with pain -Nails palliatively debridement as below -Educated on self-care  Procedure: Nail Debridement Rationale: Pain Type of Debridement: manual, sharp debridement. Instrumentation: Nail nipper, rotary burr. Number of Nails: 9 -Examined patient. -Patient to continue soft, supportive shoe gear daily. -Toenails 2-5 bilaterally and L hallux debrided in length and girth without iatrogenic bleeding with sterile nail nipper and dremel.  -Patient to report any pedal injuries to medical professional immediately. -Patient/POA to call should there be question/concern in the interim.  Return in about 3 months (around 04/29/2021).  Marzetta Board, DPM

## 2021-02-03 ENCOUNTER — Ambulatory Visit (INDEPENDENT_AMBULATORY_CARE_PROVIDER_SITE_OTHER): Payer: Medicare HMO

## 2021-02-03 DIAGNOSIS — I441 Atrioventricular block, second degree: Secondary | ICD-10-CM

## 2021-02-03 LAB — CUP PACEART REMOTE DEVICE CHECK
Battery Remaining Longevity: 122 mo
Battery Remaining Percentage: 95.5 %
Battery Voltage: 3.01 V
Brady Statistic AP VP Percent: 41 %
Brady Statistic AP VS Percent: 1 %
Brady Statistic AS VP Percent: 46 %
Brady Statistic AS VS Percent: 12 %
Brady Statistic RA Percent Paced: 42 %
Brady Statistic RV Percent Paced: 88 %
Date Time Interrogation Session: 20220517020013
Implantable Lead Implant Date: 20190808
Implantable Lead Implant Date: 20190808
Implantable Lead Location: 753859
Implantable Lead Location: 753860
Implantable Pulse Generator Implant Date: 20190808
Lead Channel Impedance Value: 390 Ohm
Lead Channel Impedance Value: 480 Ohm
Lead Channel Pacing Threshold Amplitude: 0.5 V
Lead Channel Pacing Threshold Amplitude: 0.75 V
Lead Channel Pacing Threshold Pulse Width: 0.5 ms
Lead Channel Pacing Threshold Pulse Width: 0.5 ms
Lead Channel Sensing Intrinsic Amplitude: 0.8 mV
Lead Channel Sensing Intrinsic Amplitude: 12 mV
Lead Channel Setting Pacing Amplitude: 1 V
Lead Channel Setting Pacing Amplitude: 1.5 V
Lead Channel Setting Pacing Pulse Width: 0.5 ms
Lead Channel Setting Sensing Sensitivity: 2.5 mV
Pulse Gen Model: 2272
Pulse Gen Serial Number: 9052893

## 2021-02-05 DIAGNOSIS — C61 Malignant neoplasm of prostate: Secondary | ICD-10-CM | POA: Diagnosis not present

## 2021-02-12 ENCOUNTER — Encounter (HOSPITAL_BASED_OUTPATIENT_CLINIC_OR_DEPARTMENT_OTHER): Payer: Self-pay | Admitting: Emergency Medicine

## 2021-02-12 ENCOUNTER — Inpatient Hospital Stay (HOSPITAL_BASED_OUTPATIENT_CLINIC_OR_DEPARTMENT_OTHER)
Admission: EM | Admit: 2021-02-12 | Discharge: 2021-02-14 | DRG: 205 | Disposition: A | Discharge status: Rehabilitation Facility | Payer: Medicare HMO | Attending: Pulmonary Disease | Admitting: Pulmonary Disease

## 2021-02-12 ENCOUNTER — Emergency Department (HOSPITAL_BASED_OUTPATIENT_CLINIC_OR_DEPARTMENT_OTHER): Payer: Medicare HMO

## 2021-02-12 ENCOUNTER — Other Ambulatory Visit: Payer: Self-pay

## 2021-02-12 DIAGNOSIS — I4510 Unspecified right bundle-branch block: Secondary | ICD-10-CM | POA: Diagnosis present

## 2021-02-12 DIAGNOSIS — G4733 Obstructive sleep apnea (adult) (pediatric): Secondary | ICD-10-CM | POA: Diagnosis not present

## 2021-02-12 DIAGNOSIS — E1122 Type 2 diabetes mellitus with diabetic chronic kidney disease: Secondary | ICD-10-CM | POA: Diagnosis present

## 2021-02-12 DIAGNOSIS — I959 Hypotension, unspecified: Secondary | ICD-10-CM | POA: Diagnosis not present

## 2021-02-12 DIAGNOSIS — R531 Weakness: Secondary | ICD-10-CM | POA: Diagnosis not present

## 2021-02-12 DIAGNOSIS — Z86718 Personal history of other venous thrombosis and embolism: Secondary | ICD-10-CM | POA: Diagnosis not present

## 2021-02-12 DIAGNOSIS — N179 Acute kidney failure, unspecified: Secondary | ICD-10-CM | POA: Diagnosis present

## 2021-02-12 DIAGNOSIS — G9340 Encephalopathy, unspecified: Secondary | ICD-10-CM | POA: Diagnosis not present

## 2021-02-12 DIAGNOSIS — J449 Chronic obstructive pulmonary disease, unspecified: Secondary | ICD-10-CM | POA: Diagnosis present

## 2021-02-12 DIAGNOSIS — I130 Hypertensive heart and chronic kidney disease with heart failure and stage 1 through stage 4 chronic kidney disease, or unspecified chronic kidney disease: Secondary | ICD-10-CM | POA: Diagnosis present

## 2021-02-12 DIAGNOSIS — N1831 Chronic kidney disease, stage 3a: Secondary | ICD-10-CM | POA: Diagnosis present

## 2021-02-12 DIAGNOSIS — R0902 Hypoxemia: Secondary | ICD-10-CM | POA: Diagnosis not present

## 2021-02-12 DIAGNOSIS — D320 Benign neoplasm of cerebral meninges: Secondary | ICD-10-CM | POA: Diagnosis present

## 2021-02-12 DIAGNOSIS — I5033 Acute on chronic diastolic (congestive) heart failure: Secondary | ICD-10-CM | POA: Diagnosis present

## 2021-02-12 DIAGNOSIS — D696 Thrombocytopenia, unspecified: Secondary | ICD-10-CM | POA: Diagnosis not present

## 2021-02-12 DIAGNOSIS — E662 Morbid (severe) obesity with alveolar hypoventilation: Principal | ICD-10-CM | POA: Diagnosis present

## 2021-02-12 DIAGNOSIS — E1142 Type 2 diabetes mellitus with diabetic polyneuropathy: Secondary | ICD-10-CM | POA: Diagnosis present

## 2021-02-12 DIAGNOSIS — I110 Hypertensive heart disease with heart failure: Secondary | ICD-10-CM | POA: Diagnosis present

## 2021-02-12 DIAGNOSIS — J918 Pleural effusion in other conditions classified elsewhere: Secondary | ICD-10-CM | POA: Diagnosis present

## 2021-02-12 DIAGNOSIS — E876 Hypokalemia: Secondary | ICD-10-CM | POA: Diagnosis not present

## 2021-02-12 DIAGNOSIS — I872 Venous insufficiency (chronic) (peripheral): Secondary | ICD-10-CM | POA: Diagnosis not present

## 2021-02-12 DIAGNOSIS — J9621 Acute and chronic respiratory failure with hypoxia: Secondary | ICD-10-CM | POA: Diagnosis not present

## 2021-02-12 DIAGNOSIS — D539 Nutritional anemia, unspecified: Secondary | ICD-10-CM | POA: Diagnosis present

## 2021-02-12 DIAGNOSIS — J9622 Acute and chronic respiratory failure with hypercapnia: Secondary | ICD-10-CM | POA: Diagnosis present

## 2021-02-12 DIAGNOSIS — G253 Myoclonus: Secondary | ICD-10-CM | POA: Diagnosis not present

## 2021-02-12 DIAGNOSIS — Z7901 Long term (current) use of anticoagulants: Secondary | ICD-10-CM | POA: Diagnosis not present

## 2021-02-12 DIAGNOSIS — J440 Chronic obstructive pulmonary disease with acute lower respiratory infection: Secondary | ICD-10-CM | POA: Diagnosis not present

## 2021-02-12 DIAGNOSIS — E7800 Pure hypercholesterolemia, unspecified: Secondary | ICD-10-CM | POA: Diagnosis present

## 2021-02-12 DIAGNOSIS — M4802 Spinal stenosis, cervical region: Secondary | ICD-10-CM | POA: Diagnosis not present

## 2021-02-12 DIAGNOSIS — Z20822 Contact with and (suspected) exposure to covid-19: Secondary | ICD-10-CM | POA: Diagnosis present

## 2021-02-12 DIAGNOSIS — D6959 Other secondary thrombocytopenia: Secondary | ICD-10-CM | POA: Diagnosis present

## 2021-02-12 DIAGNOSIS — I878 Other specified disorders of veins: Secondary | ICD-10-CM | POA: Diagnosis present

## 2021-02-12 DIAGNOSIS — J9690 Respiratory failure, unspecified, unspecified whether with hypoxia or hypercapnia: Secondary | ICD-10-CM | POA: Diagnosis present

## 2021-02-12 DIAGNOSIS — Z952 Presence of prosthetic heart valve: Secondary | ICD-10-CM

## 2021-02-12 DIAGNOSIS — J189 Pneumonia, unspecified organism: Secondary | ICD-10-CM | POA: Diagnosis not present

## 2021-02-12 DIAGNOSIS — R0603 Acute respiratory distress: Secondary | ICD-10-CM | POA: Diagnosis not present

## 2021-02-12 DIAGNOSIS — J9601 Acute respiratory failure with hypoxia: Secondary | ICD-10-CM | POA: Diagnosis not present

## 2021-02-12 DIAGNOSIS — Z79899 Other long term (current) drug therapy: Secondary | ICD-10-CM

## 2021-02-12 DIAGNOSIS — R29898 Other symptoms and signs involving the musculoskeletal system: Secondary | ICD-10-CM | POA: Diagnosis not present

## 2021-02-12 DIAGNOSIS — J441 Chronic obstructive pulmonary disease with (acute) exacerbation: Secondary | ICD-10-CM | POA: Diagnosis not present

## 2021-02-12 DIAGNOSIS — D519 Vitamin B12 deficiency anemia, unspecified: Secondary | ICD-10-CM | POA: Diagnosis not present

## 2021-02-12 DIAGNOSIS — R5381 Other malaise: Secondary | ICD-10-CM | POA: Diagnosis not present

## 2021-02-12 DIAGNOSIS — I050 Rheumatic mitral stenosis: Secondary | ICD-10-CM | POA: Diagnosis present

## 2021-02-12 DIAGNOSIS — I4819 Other persistent atrial fibrillation: Secondary | ICD-10-CM | POA: Diagnosis not present

## 2021-02-12 DIAGNOSIS — I2510 Atherosclerotic heart disease of native coronary artery without angina pectoris: Secondary | ICD-10-CM | POA: Diagnosis present

## 2021-02-12 DIAGNOSIS — E785 Hyperlipidemia, unspecified: Secondary | ICD-10-CM | POA: Diagnosis present

## 2021-02-12 DIAGNOSIS — R0602 Shortness of breath: Secondary | ICD-10-CM | POA: Diagnosis not present

## 2021-02-12 DIAGNOSIS — R4182 Altered mental status, unspecified: Secondary | ICD-10-CM | POA: Diagnosis not present

## 2021-02-12 DIAGNOSIS — M109 Gout, unspecified: Secondary | ICD-10-CM | POA: Diagnosis present

## 2021-02-12 DIAGNOSIS — I739 Peripheral vascular disease, unspecified: Secondary | ICD-10-CM | POA: Diagnosis present

## 2021-02-12 DIAGNOSIS — R197 Diarrhea, unspecified: Secondary | ICD-10-CM | POA: Diagnosis not present

## 2021-02-12 DIAGNOSIS — Z6841 Body Mass Index (BMI) 40.0 and over, adult: Secondary | ICD-10-CM

## 2021-02-12 DIAGNOSIS — R062 Wheezing: Secondary | ICD-10-CM | POA: Diagnosis not present

## 2021-02-12 DIAGNOSIS — J90 Pleural effusion, not elsewhere classified: Secondary | ICD-10-CM | POA: Diagnosis not present

## 2021-02-12 DIAGNOSIS — Z8546 Personal history of malignant neoplasm of prostate: Secondary | ICD-10-CM

## 2021-02-12 DIAGNOSIS — A419 Sepsis, unspecified organism: Secondary | ICD-10-CM | POA: Diagnosis not present

## 2021-02-12 DIAGNOSIS — N183 Chronic kidney disease, stage 3 unspecified: Secondary | ICD-10-CM | POA: Diagnosis not present

## 2021-02-12 DIAGNOSIS — E1151 Type 2 diabetes mellitus with diabetic peripheral angiopathy without gangrene: Secondary | ICD-10-CM | POA: Diagnosis present

## 2021-02-12 DIAGNOSIS — G7281 Critical illness myopathy: Secondary | ICD-10-CM | POA: Diagnosis not present

## 2021-02-12 DIAGNOSIS — Z7951 Long term (current) use of inhaled steroids: Secondary | ICD-10-CM | POA: Diagnosis not present

## 2021-02-12 DIAGNOSIS — G936 Cerebral edema: Secondary | ICD-10-CM | POA: Diagnosis not present

## 2021-02-12 DIAGNOSIS — Z87891 Personal history of nicotine dependence: Secondary | ICD-10-CM | POA: Diagnosis not present

## 2021-02-12 DIAGNOSIS — R091 Pleurisy: Secondary | ICD-10-CM | POA: Diagnosis not present

## 2021-02-12 DIAGNOSIS — D631 Anemia in chronic kidney disease: Secondary | ICD-10-CM | POA: Diagnosis present

## 2021-02-12 DIAGNOSIS — I890 Lymphedema, not elsewhere classified: Secondary | ICD-10-CM | POA: Diagnosis present

## 2021-02-12 DIAGNOSIS — R3915 Urgency of urination: Secondary | ICD-10-CM | POA: Diagnosis not present

## 2021-02-12 DIAGNOSIS — I517 Cardiomegaly: Secondary | ICD-10-CM | POA: Diagnosis not present

## 2021-02-12 DIAGNOSIS — I4891 Unspecified atrial fibrillation: Secondary | ICD-10-CM | POA: Diagnosis not present

## 2021-02-12 LAB — PROTIME-INR
INR: 1.2 (ref 0.8–1.2)
Prothrombin Time: 15.4 seconds — ABNORMAL HIGH (ref 11.4–15.2)

## 2021-02-12 LAB — I-STAT ARTERIAL BLOOD GAS, ED
Acid-Base Excess: 10 mmol/L — ABNORMAL HIGH (ref 0.0–2.0)
Bicarbonate: 39 mmol/L — ABNORMAL HIGH (ref 20.0–28.0)
Calcium, Ion: 1.31 mmol/L (ref 1.15–1.40)
HCT: 36 % — ABNORMAL LOW (ref 39.0–52.0)
Hemoglobin: 12.2 g/dL — ABNORMAL LOW (ref 13.0–17.0)
O2 Saturation: 93 %
Patient temperature: 98.7
Potassium: 4.3 mmol/L (ref 3.5–5.1)
Sodium: 140 mmol/L (ref 135–145)
TCO2: 41 mmol/L — ABNORMAL HIGH (ref 22–32)
pCO2 arterial: 77.6 mmHg (ref 32.0–48.0)
pH, Arterial: 7.309 — ABNORMAL LOW (ref 7.350–7.450)
pO2, Arterial: 76 mmHg — ABNORMAL LOW (ref 83.0–108.0)

## 2021-02-12 LAB — MRSA PCR SCREENING: MRSA by PCR: NEGATIVE

## 2021-02-12 LAB — I-STAT VENOUS BLOOD GAS, ED
Acid-Base Excess: 11 mmol/L — ABNORMAL HIGH (ref 0.0–2.0)
Bicarbonate: 40.3 mmol/L — ABNORMAL HIGH (ref 20.0–28.0)
Calcium, Ion: 1.34 mmol/L (ref 1.15–1.40)
HCT: 35 % — ABNORMAL LOW (ref 39.0–52.0)
Hemoglobin: 11.9 g/dL — ABNORMAL LOW (ref 13.0–17.0)
O2 Saturation: 72 %
Patient temperature: 99
Potassium: 4.1 mmol/L (ref 3.5–5.1)
Sodium: 141 mmol/L (ref 135–145)
TCO2: 43 mmol/L — ABNORMAL HIGH (ref 22–32)
pCO2, Ven: 82 mmHg (ref 44.0–60.0)
pH, Ven: 7.3 (ref 7.250–7.430)
pO2, Ven: 45 mmHg (ref 32.0–45.0)

## 2021-02-12 LAB — URINALYSIS, ROUTINE W REFLEX MICROSCOPIC
Bilirubin Urine: NEGATIVE
Glucose, UA: NEGATIVE mg/dL
Hgb urine dipstick: NEGATIVE
Ketones, ur: NEGATIVE mg/dL
Leukocytes,Ua: NEGATIVE
Nitrite: NEGATIVE
Specific Gravity, Urine: 1.015 (ref 1.005–1.030)
pH: 5.5 (ref 5.0–8.0)

## 2021-02-12 LAB — TROPONIN I (HIGH SENSITIVITY)
Troponin I (High Sensitivity): 53 ng/L — ABNORMAL HIGH (ref <18)
Troponin I (High Sensitivity): 54 ng/L — ABNORMAL HIGH (ref <18)

## 2021-02-12 LAB — LACTIC ACID, PLASMA: Lactic Acid, Venous: 1.8 mmol/L (ref 0.5–1.9)

## 2021-02-12 LAB — COMPREHENSIVE METABOLIC PANEL
ALT: 21 U/L (ref 0–44)
AST: 23 U/L (ref 15–41)
Albumin: 4 g/dL (ref 3.5–5.0)
Alkaline Phosphatase: 65 U/L (ref 38–126)
Anion gap: 8 (ref 5–15)
BUN: 41 mg/dL — ABNORMAL HIGH (ref 8–23)
CO2: 36 mmol/L — ABNORMAL HIGH (ref 22–32)
Calcium: 10 mg/dL (ref 8.9–10.3)
Chloride: 98 mmol/L (ref 98–111)
Creatinine, Ser: 1.49 mg/dL — ABNORMAL HIGH (ref 0.61–1.24)
GFR, Estimated: 48 mL/min — ABNORMAL LOW (ref >60)
Glucose, Bld: 107 mg/dL — ABNORMAL HIGH (ref 70–99)
Potassium: 4.2 mmol/L (ref 3.5–5.1)
Sodium: 142 mmol/L (ref 135–145)
Total Bilirubin: 0.7 mg/dL (ref 0.3–1.2)
Total Protein: 6.3 g/dL — ABNORMAL LOW (ref 6.5–8.1)

## 2021-02-12 LAB — CBC WITH DIFFERENTIAL/PLATELET
Abs Immature Granulocytes: 0.04 10*3/uL (ref 0.00–0.07)
Basophils Absolute: 0 10*3/uL (ref 0.0–0.1)
Basophils Relative: 0 %
Eosinophils Absolute: 0.1 10*3/uL (ref 0.0–0.5)
Eosinophils Relative: 1 %
HCT: 39.2 % (ref 39.0–52.0)
Hemoglobin: 12.1 g/dL — ABNORMAL LOW (ref 13.0–17.0)
Immature Granulocytes: 1 %
Lymphocytes Relative: 17 %
Lymphs Abs: 1.4 10*3/uL (ref 0.7–4.0)
MCH: 33.2 pg (ref 26.0–34.0)
MCHC: 30.9 g/dL (ref 30.0–36.0)
MCV: 107.4 fL — ABNORMAL HIGH (ref 80.0–100.0)
Monocytes Absolute: 0.6 10*3/uL (ref 0.1–1.0)
Monocytes Relative: 8 %
Neutro Abs: 6.1 10*3/uL (ref 1.7–7.7)
Neutrophils Relative %: 73 %
Platelets: 148 10*3/uL — ABNORMAL LOW (ref 150–400)
RBC: 3.65 MIL/uL — ABNORMAL LOW (ref 4.22–5.81)
RDW: 19.2 % — ABNORMAL HIGH (ref 11.5–15.5)
WBC: 8.3 10*3/uL (ref 4.0–10.5)
nRBC: 1.2 % — ABNORMAL HIGH (ref 0.0–0.2)

## 2021-02-12 LAB — RESP PANEL BY RT-PCR (FLU A&B, COVID) ARPGX2
Influenza A by PCR: NEGATIVE
Influenza B by PCR: NEGATIVE
SARS Coronavirus 2 by RT PCR: NEGATIVE

## 2021-02-12 LAB — BRAIN NATRIURETIC PEPTIDE: B Natriuretic Peptide: 797.2 pg/mL — ABNORMAL HIGH (ref 0.0–100.0)

## 2021-02-12 LAB — APTT: aPTT: 32 seconds (ref 24–36)

## 2021-02-12 MED ORDER — POLYETHYLENE GLYCOL 3350 17 G PO PACK: 17.0000 g | PACK | Freq: Every day | ORAL | Status: DC | PRN

## 2021-02-12 MED ORDER — ORAL CARE MOUTH RINSE
15.0000 mL | Freq: Two times a day (BID) | OROMUCOSAL | Status: DC
  Administered 2021-02-12 – 2021-02-14 (×4): 15 mL via OROMUCOSAL

## 2021-02-12 MED ORDER — CHLORHEXIDINE GLUCONATE CLOTH 2 % EX PADS
6.0000 | MEDICATED_PAD | Freq: Every day | CUTANEOUS | Status: DC
  Administered 2021-02-12 – 2021-02-13 (×2): 6 via TOPICAL

## 2021-02-12 MED ORDER — ALLOPURINOL 100 MG PO TABS
100.0000 mg | ORAL_TABLET | Freq: Every day | ORAL | Status: DC
  Administered 2021-02-13 – 2021-02-14 (×2): 100 mg via ORAL
  Filled 2021-02-12 (×2): qty 1

## 2021-02-12 MED ORDER — APIXABAN 5 MG PO TABS
5.0000 mg | ORAL_TABLET | Freq: Two times a day (BID) | ORAL | Status: DC
  Administered 2021-02-13 – 2021-02-14 (×3): 5 mg via ORAL
  Filled 2021-02-12 (×3): qty 1

## 2021-02-12 MED ORDER — SODIUM CHLORIDE 0.9 % IV BOLUS
1000.0000 mL | Freq: Once | INTRAVENOUS | Status: AC
  Administered 2021-02-12: 1000 mL via INTRAVENOUS

## 2021-02-12 MED ORDER — FUROSEMIDE 10 MG/ML IJ SOLN
40.0000 mg | Freq: Once | INTRAMUSCULAR | Status: AC
  Administered 2021-02-12: 40 mg via INTRAVENOUS
  Filled 2021-02-12: qty 4

## 2021-02-12 MED ORDER — DOCUSATE SODIUM 100 MG PO CAPS: 100.0000 mg | ORAL_CAPSULE | Freq: Two times a day (BID) | ORAL | Status: DC | PRN

## 2021-02-12 MED ORDER — GABAPENTIN 300 MG PO CAPS
300.0000 mg | ORAL_CAPSULE | Freq: Three times a day (TID) | ORAL | Status: DC
  Administered 2021-02-13 – 2021-02-14 (×4): 300 mg via ORAL
  Filled 2021-02-12 (×4): qty 1

## 2021-02-12 MED ORDER — ATORVASTATIN CALCIUM 40 MG PO TABS
80.0000 mg | ORAL_TABLET | Freq: Every day | ORAL | Status: DC
  Administered 2021-02-13 – 2021-02-14 (×2): 80 mg via ORAL
  Filled 2021-02-12 (×2): qty 2

## 2021-02-12 MED ORDER — ALBUTEROL SULFATE HFA 108 (90 BASE) MCG/ACT IN AERS: 2.0000 | INHALATION_SPRAY | Freq: Four times a day (QID) | RESPIRATORY_TRACT | Status: DC | PRN

## 2021-02-12 NOTE — ED Notes (Signed)
Handoff report given to Armed forces technical officer at [facility]

## 2021-02-12 NOTE — ED Notes (Signed)
States has been having SOB for two weeks.  States this happens every year due to pollen.  States has productive cough.

## 2021-02-12 NOTE — ED Notes (Signed)
VBG results given to Dr. Langston Masker. No new respiratory orders at this time.

## 2021-02-12 NOTE — ED Notes (Signed)
[facility] at bedside 

## 2021-02-12 NOTE — ED Triage Notes (Addendum)
Sob x 1 week ago placed on o2  At 3 l at night and given inhalers, states gets this everyyear when there is pollen

## 2021-02-12 NOTE — ED Notes (Signed)
Patient arrived to the dept w/ complaints of shob. O2 at arrival on RA was 74-75%; position changed and result remained in the 70's. Patient endorses that O2 was 81% at home. Patient does have a pulmonary hx and wears O2 at night as well as a machine for a hx of sleep apnea. Patient was placed on 3 L [place] and O2 remains 90-95% on this setting. BBS diminished and difficult to assess at this time.

## 2021-02-12 NOTE — ED Notes (Signed)
Report called and given to receiving RT at [facility]. [facility] on the way to transport patient.

## 2021-02-12 NOTE — H&P (Addendum)
NAME:  [name], MRN:  [id number], DOB:  1941/02/20, LOS: 0 ADMISSION DATE:  02/12/2021, CONSULTATION DATE:  5/26 REFERRING MD:  Dr. Adah Perl, CHIEF COMPLAINT:  Lethargy   History of Present Illness:  80 y/o M who presented to [facility] with reports of increased lethargy.    At baseline, the patient lives at home with his wife. He can ambulate but largely uses a wheelchair.  He carries a significant cardiac & respiratory history as below.   The patient was unable to provide history, wife at bedside provided information.  She reported the patient had difficulty staying awake for the last 2 weeks during the day - repeatedly falling asleep & has been confused with difficulty remembering facts.  She reported he had indicated feelings of lightheadedness in the last week and chronic cough. He is compliant with medications and recommended therapies.  He notes that he has been having trouble with his mask fitting for BIPAP and had tried to get an appointment to see Dr. Annamaria Boots for this, but it was 3 months away. Reports significant leaking.   On presentation to the ER, he was noted to have soft normal blood pressures and saturations in the 70's.  He was initially placed on 3L with improved in saturations.  He remained lethargic and due to mental status / marginal saturations, was placed on BiPAP.  Initial ABG showed acute on chronic hypercarbic respiratory failure (ph 7.30 / CO2 77 after 2 hours of BiPAP).  Initial labs demonstrated elevated CO2 on BMP (36), BUN 41 / Sr Cr 1.49 (up from baseline ~ 1), BNP 797, troponin 53, lactic acid 1.8, WBC 8.3, Hgb 12.1 and platelets 148.  CXR review shows low lung volumes, RLL opacity / effusion.  PCCM called for ICU admit.  He has had a very similar admission to this a couple of years ago.    Pertinent  Medical History  HTN  HLD CHF - ECHO 04/2020 LVEF >75%, severe LVH, moderate LAE, mild RVE Severe AS s/p TAVR  RBBB Atrial Fibrillation +  Valvular Heart Disease on Chronic Anticoagulation  PVD  B12 Anemia  Thrombocytopenia  OSA - on Trilogy at night, followed by Dr. Annamaria Boots  Chronic Hypoxic Respiratory Failure - on 3L O2, on Trelegy for maintenance COPD - PFT 2016 severe obstructive disease, no BD response, severe restriction.  Class III Obesity - BMI 50.9  Gout   Significant Hospital Events: Including procedures, antibiotic start and stop dates in addition to other pertinent events   . 5/26 Admit with lethargy, hypercarbic respiratory failure   Interim History / Subjective:    Objective   Blood pressure (!) 88/48, pulse 62, temperature 99.6 F (37.6 C), temperature source Oral, resp. rate 14, height 6\' 2"  (1.88 m), weight (!) 180 kg, SpO2 95 %.    Vent Mode: PCV;BIPAP FiO2 (%):  [30 %-40 %] 40 % Set Rate:  [14 bmp] 14 bmp PEEP:  [8 cmH20] 8 cmH20   Intake/Output Summary (Last 24 hours) at 02/12/2021 1623 Last data filed at 02/12/2021 1441 Gross per 24 hour  Intake --  Output 800 ml  Net -800 ml   Filed Weights   02/12/21 1301  Weight: (!) 180 kg    Examination: General: obese, on bipap HENT: dry mucus membranes Lungs: diminished, ctab no wheezes Cardiovascular: diminished, RRR Abdomen: obese, soft Extremities: nonpitting edema/lymphedema bilaterally Neuro: awake, follows commands, normal speech, oriented, moves all 4 extremities MSK: bilateral venous stasis dermatitis  Labs/imaging that I  havepersonally reviewed  (right click and "Reselect all SmartList Selections" daily)   ABG shows hypercapnia with PCO2 78 Troponin elevated BNP 800  EKG normal sinus rhythm, frequent PVCs, prolonged PR  Chest xray reviewed shows RLL air space opacity maybe pleural effusion No leukocytosis   Resolved Hospital Problem list      Assessment & Plan:   Acute on Chronic Hypercarbic Respiratory Failure secondary to decompensated OHS/OSA - continue nocturnal BIPAP and prn during the day. While he is here would be  great to have him bring in his home mask and have a mask fitting to help prevent readmission. - no fevers, no leukocytosis, no focal consolidation that is convincing enough for pna. No abx at this time.  - monitor for intubation.   Aortic Stenosis s/p TAVR Acute Decompensated HFpEF Atrial Fibrillation - on eliquis - diurese with IV lasix 40 mg x 1. He reports adherence to 20 mg po twice daily. Monitor electrolytes - echocardiogram ordered  AKI - monitor response to diuresis - possible cardiorenal syndrome. He does not look dry on exam  COPD - no wheezing, will hold off on steroids - continue bronchodilators prn  Best practice (right click and "Reselect all SmartList Selections" daily)  Diet:  NPO Pain/Anxiety/Delirium protocol (if indicated): No VAP protocol (if indicated): Not indicated DVT prophylaxis: Systemic AC GI prophylaxis: N/A Glucose control:  SSI No Central venous access:  N/A Arterial line:  N/A Foley:  N/A Mobility:  OOB  PT consulted: N/A Last date of multidisciplinary goals of care discussion []  Code Status:  full code Disposition: ICU  Labs   CBC: Recent Labs  Lab 02/12/21 1220 02/12/21 1300 02/12/21 1604  WBC 8.3  --   --   NEUTROABS 6.1  --   --   HGB 12.1* 11.9* 12.2*  HCT 39.2 35.0* 36.0*  MCV 107.4*  --   --   PLT 148*  --   --     Basic Metabolic Panel: Recent Labs  Lab 02/12/21 1220 02/12/21 1300 02/12/21 1604  NA 142 141 140  K 4.2 4.1 4.3  CL 98  --   --   CO2 36*  --   --   GLUCOSE 107*  --   --   BUN 41*  --   --   CREATININE 1.49*  --   --   CALCIUM 10.0  --   --    GFR: Estimated Creatinine Clearance: 70.1 mL/min (A) (by C-G formula based on SCr of 1.49 mg/dL (H)). Recent Labs  Lab 02/12/21 1220  WBC 8.3  LATICACIDVEN 1.8    Liver Function Tests: Recent Labs  Lab 02/12/21 1220  AST 23  ALT 21  ALKPHOS 65  BILITOT 0.7  PROT 6.3*  ALBUMIN 4.0   No results for input(s): LIPASE, AMYLASE in the last 168  hours. No results for input(s): AMMONIA in the last 168 hours.  ABG    Component Value Date/Time   PHART 7.309 (L) 02/12/2021 1604   PCO2ART 77.6 (HH) 02/12/2021 1604   PO2ART 76 (L) 02/12/2021 1604   HCO3 39.0 (H) 02/12/2021 1604   TCO2 41 (H) 02/12/2021 1604   O2SAT 93.0 02/12/2021 1604     Coagulation Profile: Recent Labs  Lab 02/12/21 1220  INR 1.2    Cardiac Enzymes: No results for input(s): CKTOTAL, CKMB, CKMBINDEX, TROPONINI in the last 168 hours.  HbA1C: Hgb A1c MFr Bld  Date/Time Value Ref Range Status  10/09/2020 01:37 PM 5.6 4.6 - 6.5 %  Final    Comment:    Glycemic Control Guidelines for People with Diabetes:Non Diabetic:  <6%Goal of Therapy: <7%Additional Action Suggested:  >8%   07/08/2020 01:23 PM 5.2 <5.7 % of total Hgb Final    Comment:    For the purpose of screening for the presence of diabetes: . <5.7%       Consistent with the absence of diabetes 5.7-6.4%    Consistent with increased risk for diabetes             (prediabetes) > or =6.5%  Consistent with diabetes . This assay result is consistent with a decreased risk of diabetes. . Currently, no consensus exists regarding use of hemoglobin A1c for diagnosis of diabetes in children. . According to American Diabetes Association (ADA) guidelines, hemoglobin A1c <7.0% represents optimal control in non-pregnant diabetic patients. Different metrics may apply to specific patient populations.  Standards of Medical Care in Diabetes(ADA). .     CBG: No results for input(s): GLUCAP in the last 168 hours.  Review of Systems:   +shortness of breath, cough, fatigue - le edema - chest pain - fevers, chills - anorexia, diarrhea, nausea Otherwise 10 point ROS negative  Past Medical History:  He,  has a past medical history of 1st degree AV block (11/09/2018), Anemia, Aortic stenosis, severe, Arthritis, Back pain, Bursitis, Cataract, Cellulitis (10/12/2015), CHF (congestive heart failure) ([place]),  Complication of anesthesia, Constipation, DDD (degenerative disc disease), Gout, Heart valve disorder, History of blood clots (1962), History of blood transfusion, History of shingles, Hyperlipidemia, Hypertension, Joint pain, Low back pain (01/25/2017), Morbid obesity ([place]), Neck pain on left side (01/25/2017), OSA (obstructive sleep apnea), Osteoarthritis, Peripheral edema, Peripheral neuropathy, Peroneal palsy, Persistent atrial fibrillation ([place]), Pneumonia (25+yrs ago), Prostate cancer ([place]), Prostate cancer ([place]) (02/13/2019), RBBB (11/09/2018), Shortness of breath, Swelling of extremity, Thrombocytopenia ([place]), Urinary frequency, Urinary urgency, Valvular heart disease, and Venous stasis dermatitis.   Surgical History:   Past Surgical History:  Procedure Laterality Date  . CARDIAC CATHETERIZATION N/A 05/02/2015   Procedure: Right/Left Heart Cath and Coronary Angiography;  Surgeon: Burnell Blanks, MD;  Location: [facility];  Service: Cardiovascular;  Laterality: N/A;  . CARDIOVERSION N/A 10/27/2018   Procedure: CARDIOVERSION;  Surgeon: Fay Records, MD;  Location: [facility] ENDOSCOPY;  Service: Cardiovascular;  Laterality: N/A;  . COLONOSCOPY N/A 02/07/2013   Procedure: COLONOSCOPY;  Surgeon: Juanita Craver, MD;  Location: [facility] ENDOSCOPY;  Service: Endoscopy;  Laterality: N/A;  . COLONOSCOPY N/A 02/09/2013   Procedure: COLONOSCOPY;  Surgeon: Beryle Beams, MD;  Location: [facility];  Service: Endoscopy;  Laterality: N/A;  . ESOPHAGOGASTRODUODENOSCOPY N/A 02/07/2013   Procedure: ESOPHAGOGASTRODUODENOSCOPY (EGD);  Surgeon: Juanita Craver, MD;  Location: [facility] ENDOSCOPY;  Service: Endoscopy;  Laterality: N/A;  . GIVENS CAPSULE STUDY N/A 02/09/2013   Procedure: GIVENS CAPSULE STUDY;  Surgeon: Beryle Beams, MD;  Location: [facility];  Service: Endoscopy;  Laterality: N/A;  . HERNIA REPAIR     umbilical hernia  . KNEE SURGERY Right    multiple knee surgeries due to complication of R TKA  . LAMINOTOMY   9485   c6-t2  . PACEMAKER IMPLANT N/A 04/27/2018   Procedure: PACEMAKER IMPLANT;  Surgeon: Constance Haw, MD;  Location: [facility];  Service: Cardiovascular;  Laterality: N/A;  . PROSTATE BIOPSY N/A 04/12/2019   Procedure: BIOPSY TRANSRECTAL ULTRASONIC PROSTATE (TUBP) CYSTOSCOPY;  Surgeon: [name] Hughs, MD;  Location: [facility];  Service: Urology;  Laterality: N/A;  . SPINE SURGERY    .  TEE WITHOUT CARDIOVERSION N/A 03/07/2015   Procedure: TRANSESOPHAGEAL ECHOCARDIOGRAM (TEE);  Surgeon: Josue Hector, MD;  Location[facility] ENDOSCOPY;  Service: Cardiovascular;  Laterality: N/A;  ANES TO BRING PROPOFOL PER DOCTOR  . TEE WITHOUT CARDIOVERSION N/A 06/10/2015   Procedure: TRANSESOPHAGEAL ECHOCARDIOGRAM (TEE);  Surgeon: Burnell Blanks, MD;  Location: [facility];  Service: Open Heart Surgery;  Laterality: N/A;  . TOTAL KNEE ARTHROPLASTY     right   x4 left x 1   . TRANSCATHETER AORTIC VALVE REPLACEMENT, TRANSFEMORAL Left 06/10/2015   Procedure: TRANSCATHETER AORTIC VALVE REPLACEMENT, TRANSFEMORAL;  Surgeon: Burnell Blanks, MD;  Location: [facility];  Service: Open Heart Surgery;  Laterality: Left;     Social History:   reports that he quit smoking about 14 years ago. His smoking use included pipe. He quit after 30.00 years of use. He has never used smokeless tobacco. He reports that he does not drink alcohol and does not use drugs.   Family History:  His family history includes Arthritis in his sister and sister; Other in his father; Prostate cancer in his brother. There is no history of Breast cancer or Colon cancer.   Allergies Allergies  Allergen Reactions  . Sulfa Antibiotics     Other reaction(s): Unknown  . Coumadin [Warfarin Sodium] Other (See Comments)    States he can't be on this-bleeds out     Home Medications  Prior to Admission medications   Medication Sig Start Date End Date Taking? Authorizing Provider  acetaminophen (TYLENOL) 500 MG tablet Take 1,000 mg  by mouth 2 (two) times daily.    [provider]  albuterol (VENTOLIN HFA) 108 (90 Base) MCG/ACT inhaler TAKE 2 PUFFS BY MOUTH EVERY 6 HOURS AS NEEDED 12/25/20   Midge Minium, MD  allopurinol (ZYLOPRIM) 100 MG tablet Take 1 tablet (100 mg total) by mouth daily. 08/19/20   [name], MD  atorvastatin (LIPITOR) 80 MG tablet 1 tablet by mouth daily 10/22/20   [name], MD  cetirizine (ZYRTEC) 10 MG tablet Take 1 tablet (10 mg total) by mouth daily. 12/25/20   Midge Minium, MD  Cholecalciferol (VITAMIN D3) 125 MCG (5000 UT) CAPS Take 5,000 Units by mouth daily.    [provider]  Coenzyme Q10 (COQ10) 100 MG CAPS Take 300 mg by mouth daily.     [provider]  COVID-19 mRNA vaccine, Moderna, 100 MCG/0.5ML injection INJECT AS DIRECTED 07/28/20 07/28/21  Carlyle Basques, MD  doxycycline (VIBRA-TABS) 100 MG tablet Take 1 tablet (100 mg total) by mouth 2 (two) times daily. 12/25/20   Midge Minium, MD  ELIQUIS 5 MG TABS tablet TAKE 1 TABLET BY MOUTH TWICE A DAY 07/28/20   Josue Hector, MD  Ergocalciferol (VITAMIN D2) 50 MCG (2000 UT) TABS     [provider]  famotidine (PEPCID) 40 MG tablet TAKE 1 TABLET BY MOUTH EVERY DAY 08/11/20   [name], MD  Ferrous Fumarate-Folic Acid (HEMATINIC/FOLIC ACID) 585-2 MG TABS Take 1 tablet by mouth daily. 07/08/20   [name], MD  Fluticasone-Umeclidin-Vilant (TRELEGY ELLIPTA) 100-62.5-25 MCG/INH AEPB Inhale 1 puff into the lungs daily. 05/30/20   Deneise Lever, MD  furosemide (LASIX) 20 MG tablet Take 1 tablet (20 mg total) by mouth 2 (two) times daily. 09/25/20   Josue Hector, MD  gabapentin (NEURONTIN) 300 MG capsule Take 1 capsule (300 mg total) by mouth 3 (three) times daily. 09/25/20   [name], MD  Misc Natural Products (TART CHERRY ADVANCED PO) Take 1,200 mg by mouth 2 (two) times a day.     [provider]  Multiple Vitamins-Minerals (CENTRUM SILVER PO) Take 1 tablet by  mouth daily.    [provider]  Multiple Vitamins-Minerals (MULTIVITAMIN ADULTS PO)     [provider]  Phenazopyridine HCl (AZO-STANDARD PO) Take by mouth.    [provider]  polyethylene glycol (MIRALAX / GLYCOLAX) packet Take 17 g by mouth daily.    [provider]  vitamin C (ASCORBIC ACID) 500 MG tablet Take 500 mg by mouth 2 (two) times daily.     [provider]     Critical care time: 49    The patient is critically ill due to respiratory failure, impending intubation.  Critical care was necessary to treat or prevent imminent or life-threatening deterioration.  Critical care was time spent personally by me on the following activities: development of treatment plan with patient and/or surrogate as well as nursing, discussions with consultants, evaluation of patient's response to treatment, examination of patient, obtaining history from patient or surrogate, ordering and performing treatments and interventions, ordering and review of laboratory studies, ordering and review of radiographic studies, pulse oximetry, re-evaluation of patient's condition and participation in multidisciplinary rounds.   Critical Care Time devoted to patient care services described in this note is 31 minutes. This time reflects time of care of this [place] . This critical care time does not reflect separately billable procedures or procedure time, teaching time or supervisory time of PA/NP/Med student/Med Resident etc but could involve care discussion time.       Spero Geralds [place] [facility] 02/12/2021 6:34 PM  Pager: see AMION  If no response to pager , please call critical care on call (see AMION) until 7pm After 7:00 pm call [place]

## 2021-02-12 NOTE — ED Notes (Signed)
Patient was initiated on BiPAP due to continued decline in O2 sats. Patient is tolerating BiPAP well at this time.

## 2021-02-12 NOTE — ED Notes (Signed)
X-ray at bedside

## 2021-02-12 NOTE — ED Provider Notes (Signed)
[facility] EMERGENCY DEPT Provider Note   CSN: [id number] Arrival date & time: 02/12/21  1201     History Chief Complaint  Patient presents with  . Shortness of Breath    [name] is a 80 y.o. male w/ hx of AS s/p TAVR, diastolic heart failure, severe LVH, COPD on 3L  (quit smoking years ago), anemia, pacemaker, obesity, HLD, on Eliquis, who presents to the emergency department with lethargy.  History is supplemented by his wife at bedside.  They report the patient has had difficulty staying awake for the past 2 weeks.  She reports that he is repeatedly falling asleep, even during midsentence.  She is also concerned that he is more confused than normal, and seems to have difficulty remembering certain facts.  They have been able to go out of the house.  The patient is mobile, although he does generally use a wheelchair.  He reports that he did have an episode of feeling lightheaded at some point in the past week.  He reports a chronic cough, feels it may be worse perhaps.  He denies any nausea, vomiting, abdominal pain, diarrhea.  He reports he wears 3 L nasal cannula at all times for his chronic COPD.  He denies CP or chest pressure.  He is compliant with his medications including Lasix and Eliquis.  He is unaware of any prior history of hypertension, although this is documented in 2021 family care office note.  He does not appear to be on any hypertension medications.  He does not recall the name of all his medications.  Last echo in 2021: Sonographer Comments: Echo was performed with patient in wheelchair.  IMPRESSIONS   1. Vigorous LV systolic function; severe LVH; s/p TAVR with mean gradient  9 mmHg and no AI; severe MAC with severe MS (MVA 1.2 cm2; mean gradient 14  mmHg); moderate LAE; mild RVE.  2. Left ventricular ejection fraction, by estimation, is >75%. The left  ventricle has hyperdynamic function. The left ventricle has no regional  wall motion  abnormalities. There is severe left ventricular hypertrophy.  Left ventricular diastolic parameters are indeterminate. Elevated left atrial pressure.  3. Right ventricular systolic function was not well visualized. The right  ventricular size is mildly enlarged.  4. Left atrial size was moderately dilated.  5. The mitral valve is normal in structure. No evidence of mitral valve  regurgitation. Severe mitral stenosis.  6. The aortic valve has been repaired/replaced. Aortic valve  regurgitation is not visualized. No aortic stenosis is present. There is a  TAVR valve present in the aortic position.  7. The inferior vena cava is normal in size with greater than 50%  respiratory variability, suggesting right atrial pressure of 3 mmHg.   HPI     Past Medical History:  Diagnosis Date  . 1st degree AV block 11/09/2018   Noted on EKG   . Anemia   . Aortic stenosis, severe    S/p Edwards Sapien 3 Transcatheter Heart Valve (size 26 mm, model # [id number], serial # [id number])  . Arthritis   . Back pain   . Bursitis   . Cataract    left immature  . Cellulitis 10/12/2015  . CHF (congestive heart failure) ([place])   . Complication of anesthesia    Halucinations  . Constipation    takes Miralax daily as well as Senokot daily  . DDD (degenerative disc disease)   . Gout    takes Allopurinol daily  . Heart valve  disorder   . History of blood clots 1962   knee  . History of blood transfusion    no abnormal reaction noted  . History of shingles   . Hyperlipidemia    takes Atorvastatin daily  . Hypertension    takes Lisinopril daily  . Low back pain 01/25/2017  . Morbid obesity ([place])   . OSA (obstructive sleep apnea)   . Osteoarthritis   . Peripheral neuropathy    takes Gabapentin daily  . Peroneal palsy    significant right foot drop  . Persistent atrial fibrillation ([place])   . Pneumonia 25+yrs ago   hx of  . Prostate cancer ([place]) 02/13/2019  . RBBB 11/09/2018   Noted on EKG  .  Thrombocytopenia ([place])   . Urinary frequency   . Urinary urgency   . Valvular heart disease   . Venous stasis dermatitis     Patient Active Problem List   Diagnosis Date Noted  . Respiratory failure ([place]) 02/12/2021  . Abnormal albumin 06/15/2020  . Proteins serum plasma low 06/02/2020  . Injury of toe on right foot 05/20/2020  . Chronic respiratory failure with hypoxia ([place]) 02/26/2020  . Coagulation defect ([place]) 12/07/2019  . Malignant neoplasm of prostate ([place]) 05/15/2019  . Pain due to onychomycosis of toenails of both feet 03/13/2019  . PVD (peripheral vascular disease) ([place]) 03/13/2019  . Prostate cancer ([place]) 02/13/2019  . Urinary tract infection 12/16/2018  . Persistent atrial fibrillation ([place]) 10/17/2018  . Primary osteoarthritis of left shoulder 08/23/2018  . Renal insufficiency 06/29/2018  . Bradycardia 04/26/2018  . Other fatigue 12/06/2017  . Shortness of breath on exertion 12/06/2017  . Essential hypertension 12/06/2017  . Hyperglycemia 07/28/2017  . Neck pain on left side 01/25/2017  . Low back pain 01/25/2017  . Constipation 08/15/2015  . Severe aortic valve stenosis   . Medicare annual wellness visit, subsequent 03/09/2015  . Preventative health care 03/09/2015  . Allergic rhinitis 12/02/2014  . COPD mixed type ([place]) 12/02/2014  . Chronic venous insufficiency 10/02/2013  . Venous stasis dermatitis 10/02/2013  . AVM (arteriovenous malformation) of colon with hemorrhage 02/10/2013  . Thrombocytopenia, unspecified ([place]) 02/09/2013  . EDEMA 04/15/2010  . HYPERCHOLESTEROLEMIA 04/14/2010  . Gout 04/14/2010  . Anemia 04/14/2010  . Aortic valve disorder 04/14/2010  . VALVULAR HEART DISEASE 04/14/2010  . Osteoarthritis 04/14/2010  . Multilevel degenerative disc disease 04/14/2010  . BURSITIS 04/14/2010  . OBESITY, MORBID 03/21/2010  . Obstructive sleep apnea 02/19/2008    Past Surgical History:  Procedure Laterality Date  . CARDIAC CATHETERIZATION N/A  05/02/2015   Procedure: Right/Left Heart Cath and Coronary Angiography;  Surgeon: Burnell Blanks, MD;  Location: [facility] CV LAB;  Service: Cardiovascular;  Laterality: N/A;  . CARDIOVERSION N/A 10/27/2018   Procedure: CARDIOVERSION;  Surgeon: Fay Records, MD;  Location: [facility] ENDOSCOPY;  Service: Cardiovascular;  Laterality: N/A;  . COLONOSCOPY N/A 02/07/2013   Procedure: COLONOSCOPY;  Surgeon: Juanita Craver, MD;  Location[facility] ENDOSCOPY;  Service: Endoscopy;  Laterality: N/A;  . COLONOSCOPY N/A 02/09/2013   Procedure: COLONOSCOPY;  Surgeon: Beryle Beams, MD;  Location: [facility];  Service: Endoscopy;  Laterality: N/A;  . ESOPHAGOGASTRODUODENOSCOPY N/A 02/07/2013   Procedure: ESOPHAGOGASTRODUODENOSCOPY (EGD);  Surgeon: Juanita Craver, MD;  Location: [facility] ENDOSCOPY;  Service: Endoscopy;  Laterality: N/A;  . GIVENS CAPSULE STUDY N/A 02/09/2013   Procedure: GIVENS CAPSULE STUDY;  Surgeon: Beryle Beams, MD;  Location: [facility];  Service: Endoscopy;  Laterality: N/A;  . HERNIA  REPAIR     umbilical hernia  . KNEE SURGERY Right    multiple knee surgeries due to complication of R TKA  . LAMINOTOMY  6789   c6-t2  . PACEMAKER IMPLANT N/A 04/27/2018   Procedure: PACEMAKER IMPLANT;  Surgeon: Constance Haw, MD;  Location: [facility] CV LAB;  Service: Cardiovascular;  Laterality: N/A;  . PROSTATE BIOPSY N/A 04/12/2019   Procedure: BIOPSY TRANSRECTAL ULTRASONIC PROSTATE (TUBP) CYSTOSCOPY;  Surgeon: [name] Hughs, MD;  Location: [facility] ORS;  Service: Urology;  Laterality: N/A;  . SPINE SURGERY    . TEE WITHOUT CARDIOVERSION N/A 03/07/2015   Procedure: TRANSESOPHAGEAL ECHOCARDIOGRAM (TEE);  Surgeon: Josue Hector, MD;  Location[facility] ENDOSCOPY;  Service: Cardiovascular;  Laterality: N/A;  ANES TO BRING PROPOFOL PER DOCTOR  . TEE WITHOUT CARDIOVERSION N/A 06/10/2015   Procedure: TRANSESOPHAGEAL ECHOCARDIOGRAM (TEE);  Surgeon: Burnell Blanks, MD;  Location: [facility];  Service: Open Heart  Surgery;  Laterality: N/A;  . TOTAL KNEE ARTHROPLASTY     right   x4 left x 1   . TRANSCATHETER AORTIC VALVE REPLACEMENT, TRANSFEMORAL Left 06/10/2015   Procedure: TRANSCATHETER AORTIC VALVE REPLACEMENT, TRANSFEMORAL;  Surgeon: Burnell Blanks, MD;  Location: [facility];  Service: Open Heart Surgery;  Laterality: Left;       Family History  Problem Relation Age of Onset  . Other Father        POSSIBLE HEART ATTACK  . Arthritis Sister        "crippling"  . Prostate cancer Brother   . Arthritis Sister   . Breast cancer Neg Hx   . Colon cancer Neg Hx     Social History   Tobacco Use  . Smoking status: Former Smoker    Years: 30.00    Types: Pipe    Quit date: 09/20/2006    Years since quitting: 14.4  . Smokeless tobacco: Never Used  Vaping Use  . Vaping Use: Never used  Substance Use Topics  . Alcohol use: No    Alcohol/week: 0.0 standard drinks  . Drug use: No    Home Medications Prior to Admission medications   Medication Sig Start Date End Date Taking? Authorizing Provider  acetaminophen (TYLENOL) 500 MG tablet Take 1,000 mg by mouth 2 (two) times daily.    [provider]  albuterol (VENTOLIN HFA) 108 (90 Base) MCG/ACT inhaler TAKE 2 PUFFS BY MOUTH EVERY 6 HOURS AS NEEDED 12/25/20   Midge Minium, MD  allopurinol (ZYLOPRIM) 100 MG tablet Take 1 tablet (100 mg total) by mouth daily. 08/19/20   [name], MD  atorvastatin (LIPITOR) 80 MG tablet 1 tablet by mouth daily 10/22/20   [name], MD  cetirizine (ZYRTEC) 10 MG tablet Take 1 tablet (10 mg total) by mouth daily. 12/25/20   Midge Minium, MD  Cholecalciferol (VITAMIN D3) 125 MCG (5000 UT) CAPS Take 5,000 Units by mouth daily.    [provider]  Coenzyme Q10 (COQ10) 100 MG CAPS Take 300 mg by mouth daily.     [provider]  COVID-19 mRNA vaccine, Moderna, 100 MCG/0.5ML injection INJECT AS DIRECTED 07/28/20 07/28/21  Carlyle Basques, MD  doxycycline (VIBRA-TABS) 100 MG  tablet Take 1 tablet (100 mg total) by mouth 2 (two) times daily. 12/25/20   Midge Minium, MD  ELIQUIS 5 MG TABS tablet TAKE 1 TABLET BY MOUTH TWICE A DAY 07/28/20   Josue Hector, MD  Ergocalciferol (VITAMIN D2) 50 MCG (2000 UT) TABS  [provider]  famotidine (PEPCID) 40 MG tablet TAKE 1 TABLET BY MOUTH EVERY DAY 08/11/20   [name], MD  Ferrous Fumarate-Folic Acid (HEMATINIC/FOLIC ACID) 416-3 MG TABS Take 1 tablet by mouth daily. 07/08/20   [name], MD  Fluticasone-Umeclidin-Vilant (TRELEGY ELLIPTA) 100-62.5-25 MCG/INH AEPB Inhale 1 puff into the lungs daily. 05/30/20   Deneise Lever, MD  furosemide (LASIX) 20 MG tablet Take 1 tablet (20 mg total) by mouth 2 (two) times daily. 09/25/20   Josue Hector, MD  gabapentin (NEURONTIN) 300 MG capsule Take 1 capsule (300 mg total) by mouth 3 (three) times daily. 09/25/20   [name], MD  Misc Natural Products (TART CHERRY ADVANCED PO) Take 1,200 mg by mouth 2 (two) times a day.     [provider]  Multiple Vitamins-Minerals (CENTRUM SILVER PO) Take 1 tablet by mouth daily.    [provider]  Multiple Vitamins-Minerals (MULTIVITAMIN ADULTS PO)     [provider]  Phenazopyridine HCl (AZO-STANDARD PO) Take by mouth.    [provider]  polyethylene glycol (MIRALAX / GLYCOLAX) packet Take 17 g by mouth daily.    [provider]  vitamin C (ASCORBIC ACID) 500 MG tablet Take 500 mg by mouth 2 (two) times daily.     [provider]    Allergies    Sulfa antibiotics and Coumadin [warfarin sodium]  Review of Systems   Review of Systems  Constitutional: Positive for activity change and fatigue. Negative for chills and fever.  HENT: Negative for ear pain and sore throat.   Eyes: Negative for pain and visual disturbance.  Respiratory: Positive for cough. Negative for shortness of breath.   Cardiovascular: Negative for chest pain and palpitations.   Gastrointestinal: Negative for abdominal pain, diarrhea, nausea and vomiting.  Genitourinary: Negative for dysuria and hematuria.  Musculoskeletal: Negative for arthralgias and back pain.  Skin: Negative for color change and rash.  Neurological: Positive for light-headedness. Negative for syncope.  All other systems reviewed and are negative.   Physical Exam Updated Vital Signs BP (!) 125/54 (BP Location: Right Arm)   Pulse (!) 59   Temp (!) 97.1 F (36.2 C) (Axillary)   Resp 14   Ht _0  (1.88 m)   Wt (!) 176.5 kg   SpO2 100%   BMI 49.96 kg/m   Physical Exam Constitutional:      General: He is not in acute distress.    Appearance: He is obese.  HENT:     Head: Normocephalic and atraumatic.  Eyes:     Conjunctiva/sclera: Conjunctivae normal.     Pupils: Pupils are equal, round, and reactive to light.  Cardiovascular:     Rate and Rhythm: Normal rate and regular rhythm.  Pulmonary:     Effort: Pulmonary effort is normal. No respiratory distress.     Comments: 75% on room air, 95% on 3L [place] (baseline level) Abdominal:     General: There is no distension.     Tenderness: There is no abdominal tenderness.  Musculoskeletal:     Comments: Chronic brawny edema of lower extremities  Skin:    General: Skin is warm and dry.  Neurological:     General: No focal deficit present.     Mental Status: He is alert and oriented to person, place, and time. Mental status is at baseline.     Cranial Nerves: No cranial nerve deficit.     ED Results / Procedures / Treatments  Labs (all labs ordered are listed, but only abnormal results are displayed) Labs Reviewed  COMPREHENSIVE METABOLIC PANEL - Abnormal; Notable for the following components:      Result Value   CO2 36 (*)    Glucose, Bld 107 (*)    BUN 41 (*)    Creatinine, Ser 1.49 (*)    Total Protein 6.3 (*)    GFR, Estimated 48 (*)    All other components within normal limits  CBC WITH DIFFERENTIAL/PLATELET - Abnormal;  Notable for the following components:   RBC 3.65 (*)    Hemoglobin 12.1 (*)    MCV 107.4 (*)    RDW 19.2 (*)    Platelets 148 (*)    nRBC 1.2 (*)    All other components within normal limits  PROTIME-INR - Abnormal; Notable for the following components:   Prothrombin Time 15.4 (*)    All other components within normal limits  URINALYSIS, ROUTINE W REFLEX MICROSCOPIC - Abnormal; Notable for the following components:   Protein, ur TRACE (*)    All other components within normal limits  BRAIN NATRIURETIC PEPTIDE - Abnormal; Notable for the following components:   B Natriuretic Peptide 797.2 (*)    All other components within normal limits  I-STAT VENOUS BLOOD GAS, ED - Abnormal; Notable for the following components:   pCO2, Ven 82.0 (*)    Bicarbonate 40.3 (*)    TCO2 43 (*)    Acid-Base Excess 11.0 (*)    HCT 35.0 (*)    Hemoglobin 11.9 (*)    All other components within normal limits  I-STAT ARTERIAL BLOOD GAS, ED - Abnormal; Notable for the following components:   pH, Arterial 7.309 (*)    pCO2 arterial 77.6 (*)    pO2, Arterial 76 (*)    Bicarbonate 39.0 (*)    TCO2 41 (*)    Acid-Base Excess 10.0 (*)    HCT 36.0 (*)    Hemoglobin 12.2 (*)    All other components within normal limits  TROPONIN I (HIGH SENSITIVITY) - Abnormal; Notable for the following components:   Troponin I (High Sensitivity) 53 (*)    All other components within normal limits  TROPONIN I (HIGH SENSITIVITY) - Abnormal; Notable for the following components:   Troponin I (High Sensitivity) 54 (*)    All other components within normal limits  RESP PANEL BY RT-PCR (FLU A&B, COVID) ARPGX2  CULTURE, BLOOD (SINGLE)  URINE CULTURE  MRSA PCR SCREENING  LACTIC ACID, PLASMA  APTT    EKG EKG Interpretation  Date/Time:  Thursday Feb 12 2021 12:22:03 EDT Ventricular Rate:  74 PR Interval:  264 QRS Duration: 132 QT Interval:  443 QTC Calculation: 495 R Axis:   -63 Text Interpretation: V paced rhythm,  Similar to Jan 2020 ecg ST elevations consistent with LBBB/paced pattern Confirmed by Octaviano Glow [phone]) on 02/12/2021 12:31:14 PM   Radiology DG Chest Port 1 View  Result Date: 02/12/2021 CLINICAL DATA:  Question sepsis. EXAM: PORTABLE CHEST 1 VIEW COMPARISON:  05/08/2020 FINDINGS: Cardiac enlargement. Dual lead pacemaker. No significant edema. TAVR. Right lower lobe airspace density is unchanged from the prior study and likely is atelectasis or scarring. Possible small right pleural effusion. Negative for edema or pneumonia IMPRESSION: Mild right lower lobe airspace density unchanged from the prior study. No superimposed acute abnormality. Electronically Signed   By: Franchot Gallo M.D.   On: 02/12/2021 13:25    Procedures .Critical Care Performed by: Wyvonnia Dusky, MD Authorized  by: Wyvonnia Dusky, MD   Critical care provider statement:    Critical care time (minutes):  45   Critical care was necessary to treat or prevent imminent or life-threatening deterioration of the following conditions:  Respiratory failure   Critical care was time spent personally by me on the following activities:  Discussions with consultants, evaluation of patient's response to treatment, examination of patient, ordering and performing treatments and interventions, ordering and review of laboratory studies, ordering and review of radiographic studies, pulse oximetry, re-evaluation of patient's condition, obtaining history from patient or surrogate and review of old charts     Medications Ordered in ED Medications  MEDLINE mouth rinse (has no administration in time range)  Chlorhexidine Gluconate Cloth 2 % PADS 6 each (has no administration in time range)  sodium chloride 0.9 % bolus 1,000 mL (0 mLs Intravenous Stopped 02/12/21 1410)    ED Course  I have reviewed the triage vital signs and the nursing notes.  Pertinent labs & imaging results that were available during my care of the patient were  reviewed by me and considered in my medical decision making (see chart for details).  This patient complains of fatigue x 2 weeks .  This involves an extensive number of treatment options, and is a complaint that carries with it a high risk of complications and morbidity.  The differential diagnosis includes infection vs anemia vs arrhythmia vs atypical ACS vs other  This is most consistent with COPD & CHF exacerbation.  He has some mild confusion on exam, forgetfulness.  Per medical history he was admitted for hypercapneic respiratory failure and encephalopathy 1 year ago with similar presentation, and improved on bipap overnight.    BP is soft on arrival today - infectious workup initiated, 1 L IV fluid bolus ordered for initial resuscitation.  However his labs subsequently demonstrated a normal lactate level, no leukocytosis, and no clear nidus of infection per UA or xray of the chest.  I had a lower suspicion thereafter for sepsis.  I also felt this was likely PE with the patient's compliance on eliquis.  I ordered, reviewed, and interpreted labs as noted below I ordered imaging studies which included dg chest I independently visualized and interpreted imaging which showed pulm edema, no focal infiltrates Additional history was obtained from patient's wife at bedside Previous records obtained and reviewed showing most recent echocardiogram from 2021, hospitalization last year  Case discussed with ICU who will admit patient.  He was stabilized from a respiratory standpoint on bipap.  BP remained soft but stable here with systolic pressures in 10'G.  Low threshold for initiating peripheral vasopressors if there is a further drop, but he's stable at the moment.  His wife is unclear of his code status.  He has an advanced directive at home.  His son Ysidro Evert would be the best contact person, but for now she believes the patient would want FULL CODE.  Clinical Course as of 02/12/21 1800  Thu Feb 12, 2021  1301 No significant anemia or leukocytosis on CBC [MT]  1304 We discussed reviewed.  There is no acidosis.  PCO2 is elevated at 82, although his bicarb is also significantly elevated, and I suspect this is largely chronic retention.  Per medical record review, he was admitted to the hospital last year for hypercapnic respiratory failure and encephalopathy, although his PCO2 level that time was 110 and he was acidotic. [MT]  1311 Trop 53 today, but within baseline range for him,  which is chronically mildly elevated.  [MT]  1311 Lactic acid 1.8 [MT]  1312 BUN and Cr elevated - possible pre-renal/dehydration component [MT]  1320 BNP chronically elevated, near levels 1 year ago.  797 [MT]  1345 Hypoxic to 80% on 4L [place], improved to 90% with reposturing and effort, but we'll start on bipap [MT]  1356 SARS Coronavirus 2 by RT PCR: NEGATIVE [MT]  1436 UA without evidence of infection [MT]  1501 I spoke to Dr Theadora Rama [facility] for critical care who accepted patient for ICU admission.  Patient more comfortable now on Bipap.  I have  a lower suspicion for septic shock or infection with normal WBC and lactate.  This is more likely combined CHF & COPD failure.  Patient remains stabl,e mentating well. [MT]    Clinical Course User Index [MT] Takela Varden, Carola Rhine, MD    Final Clinical Impression(s) / ED Diagnoses Final diagnoses:  Acute respiratory failure with hypoxia ([place])  AKI (acute kidney injury) [facility])    Rx / DC Orders ED Discharge Orders    None       Wyvonnia Dusky, MD 02/12/21 1800

## 2021-02-12 NOTE — ED Provider Notes (Signed)
4:23 PM Patient is about to be transported by [facility].  I reevaluated patient and he took me a few seconds to wake him up but after I sat him more upright was able to talk to him more he is now talking to me and conversing and not falling asleep.  Perhaps a little confused but he is protecting his airway.  He was able to roll his eyes when I told him he had to be admitted to the hospital, stating he does not want to be admitted but understands he needs to be.  At this point I do not think he needs emergent intubation and is safe for transport via BiPAP with [facility].  4:59 PM Remains awake and stable for transport. Soft BPs but no hypotension currently.    Sherwood Gambler, MD 02/12/21 [id number]

## 2021-02-13 ENCOUNTER — Inpatient Hospital Stay (HOSPITAL_COMMUNITY): Payer: Medicare HMO

## 2021-02-13 ENCOUNTER — Telehealth: Payer: Self-pay | Admitting: Pulmonary Disease

## 2021-02-13 DIAGNOSIS — R0603 Acute respiratory distress: Secondary | ICD-10-CM

## 2021-02-13 DIAGNOSIS — J9611 Chronic respiratory failure with hypoxia: Secondary | ICD-10-CM

## 2021-02-13 DIAGNOSIS — J9621 Acute and chronic respiratory failure with hypoxia: Secondary | ICD-10-CM | POA: Diagnosis not present

## 2021-02-13 DIAGNOSIS — J9622 Acute and chronic respiratory failure with hypercapnia: Secondary | ICD-10-CM | POA: Diagnosis not present

## 2021-02-13 LAB — BASIC METABOLIC PANEL
Anion gap: 8 (ref 5–15)
BUN: 34 mg/dL — ABNORMAL HIGH (ref 8–23)
CO2: 36 mmol/L — ABNORMAL HIGH (ref 22–32)
Calcium: 9.2 mg/dL (ref 8.9–10.3)
Chloride: 99 mmol/L (ref 98–111)
Creatinine, Ser: 1.31 mg/dL — ABNORMAL HIGH (ref 0.61–1.24)
GFR, Estimated: 56 mL/min — ABNORMAL LOW (ref >60)
Glucose, Bld: 88 mg/dL (ref 70–99)
Potassium: 4.4 mmol/L (ref 3.5–5.1)
Sodium: 143 mmol/L (ref 135–145)

## 2021-02-13 LAB — CBC
HCT: 37.5 % — ABNORMAL LOW (ref 39.0–52.0)
Hemoglobin: 11.6 g/dL — ABNORMAL LOW (ref 13.0–17.0)
MCH: 34 pg (ref 26.0–34.0)
MCHC: 30.9 g/dL (ref 30.0–36.0)
MCV: 110 fL — ABNORMAL HIGH (ref 80.0–100.0)
Platelets: 108 10*3/uL — ABNORMAL LOW (ref 150–400)
RBC: 3.41 MIL/uL — ABNORMAL LOW (ref 4.22–5.81)
RDW: 19.3 % — ABNORMAL HIGH (ref 11.5–15.5)
WBC: 5.5 10*3/uL (ref 4.0–10.5)
nRBC: 0.5 % — ABNORMAL HIGH (ref 0.0–0.2)

## 2021-02-13 LAB — PHOSPHORUS: Phosphorus: 4.2 mg/dL (ref 2.5–4.6)

## 2021-02-13 LAB — ECHOCARDIOGRAM COMPLETE
AR max vel: 2.23 cm2
AV Area VTI: 2 cm2
AV Area mean vel: 1.99 cm2
AV Mean grad: 10 mmHg
AV Peak grad: 19.4 mmHg
Ao pk vel: 2.2 m/s
Height: 74 in
MV VTI: 1.02 cm2
S' Lateral: 3.4 cm
Weight: 6225.79 oz

## 2021-02-13 LAB — BLOOD GAS, VENOUS
Acid-Base Excess: 11.9 mmol/L — ABNORMAL HIGH (ref 0.0–2.0)
Bicarbonate: 39 mmol/L — ABNORMAL HIGH (ref 20.0–28.0)
O2 Saturation: 77.7 %
Patient temperature: 98.6
pCO2, Ven: 66.2 mmHg — ABNORMAL HIGH (ref 44.0–60.0)
pH, Ven: 7.388 (ref 7.250–7.430)
pO2, Ven: 42.6 mmHg (ref 32.0–45.0)

## 2021-02-13 LAB — MAGNESIUM: Magnesium: 2 mg/dL (ref 1.7–2.4)

## 2021-02-13 MED ORDER — FLUTICASONE FUROATE-VILANTEROL 100-25 MCG/INH IN AEPB
1.0000 | INHALATION_SPRAY | Freq: Every day | RESPIRATORY_TRACT | Status: DC
  Filled 2021-02-13: qty 28

## 2021-02-13 MED ORDER — FUROSEMIDE 20 MG PO TABS
20.0000 mg | ORAL_TABLET | Freq: Two times a day (BID) | ORAL | Status: DC
  Administered 2021-02-13 – 2021-02-14 (×3): 20 mg via ORAL
  Filled 2021-02-13 (×3): qty 1

## 2021-02-13 MED ORDER — FLUTICASONE-UMECLIDIN-VILANT 100-62.5-25 MCG/INH IN AEPB: 1.0000 | INHALATION_SPRAY | Freq: Every day | RESPIRATORY_TRACT | Status: DC

## 2021-02-13 MED ORDER — FAMOTIDINE 20 MG PO TABS
20.0000 mg | ORAL_TABLET | Freq: Two times a day (BID) | ORAL | Status: DC
  Administered 2021-02-13 – 2021-02-14 (×3): 20 mg via ORAL
  Filled 2021-02-13 (×3): qty 1

## 2021-02-13 MED ORDER — UMECLIDINIUM BROMIDE 62.5 MCG/INH IN AEPB
1.0000 | INHALATION_SPRAY | Freq: Every day | RESPIRATORY_TRACT | Status: DC
  Filled 2021-02-13: qty 7

## 2021-02-13 NOTE — Progress Notes (Signed)
NAME:  [name], MRN:  [id number], DOB:  1940/09/21, LOS: 1 ADMISSION DATE:  02/12/2021, CONSULTATION DATE:  5/26 REFERRING MD:  Dr. Adah Perl, CHIEF COMPLAINT:  Lethargy   History of Present Illness:  80 y/o M who presented to [facility] with reports of increased lethargy.    At baseline, the patient lives at home with his wife. He can ambulate but largely uses a wheelchair.  He carries a significant cardiac & respiratory history as below.   The patient was unable to provide history, wife at bedside provided information.  She reported the patient had difficulty staying awake for the last 2 weeks during the day - repeatedly falling asleep & has been confused with difficulty remembering facts.  She reported he had indicated feelings of lightheadedness in the last week and chronic cough. He is compliant with medications and recommended therapies.  He notes that he has been having trouble with his mask fitting for BIPAP and had tried to get an appointment to see Dr. Annamaria Boots for this, but it was 3 months away. Reports significant leaking.   On presentation to the ER, he was noted to have soft normal blood pressures and saturations in the 70's.  He was initially placed on 3L with improved in saturations.  He remained lethargic and due to mental status / marginal saturations, was placed on BiPAP.  Initial ABG showed acute on chronic hypercarbic respiratory failure (ph 7.30 / CO2 77 after 2 hours of BiPAP).  Initial labs demonstrated elevated CO2 on BMP (36), BUN 41 / Sr Cr 1.49 (up from baseline ~ 1), BNP 797, troponin 53, lactic acid 1.8, WBC 8.3, Hgb 12.1 and platelets 148.  CXR review shows low lung volumes, RLL opacity / effusion.  PCCM called for ICU admit.  He has had a very similar admission to this a couple of years ago.    Pertinent  Medical History  HTN  HLD CHF - ECHO 04/2020 LVEF >75%, severe LVH, moderate LAE, mild RVE Severe AS s/p TAVR  RBBB Atrial Fibrillation +  Valvular Heart Disease on Chronic Anticoagulation  PVD  B12 Anemia  Thrombocytopenia  OSA - on Trilogy at night, followed by Dr. Annamaria Boots  Chronic Hypoxic Respiratory Failure - on 3L O2, on Trelegy for maintenance COPD - PFT 2016 severe obstructive disease, no BD response, severe restriction.  Class III Obesity - BMI 50.9  Gout   Significant Hospital Events: Including procedures, antibiotic start and stop dates in addition to other pertinent events   . 5/26 Admit with lethargy, hypercarbic respiratory failure  . 5/27 much improved. Off NIV  Interim History / Subjective:  No complaints this morning. Cites multiple problems with is NIV at home. As well as some productive cough with yellow colored sputum. Denies any fevers or chills.   Objective   Blood pressure (!) 126/58, pulse (!) 56, temperature 98.2 F (36.8 C), temperature source Axillary, resp. rate 13, height 6\' 2"  (1.88 m), weight (!) 176.5 kg, SpO2 94 %.    Vent Mode: PCV;BIPAP FiO2 (%):  [30 %-40 %] 40 % Set Rate:  [14 bmp] 14 bmp PEEP:  [8 cmH20] 8 cmH20   Intake/Output Summary (Last 24 hours) at 02/13/2021 0849 Last data filed at 02/12/2021 1441 Gross per 24 hour  Intake --  Output 800 ml  Net -800 ml   Filed Weights   02/12/21 1301 02/12/21 1755 02/13/21 0500  Weight: (!) 180 kg (!) 176.5 kg (!) 176.5 kg  Examination: General:  Morbidly obese elderly male in NAD Neuro:  Alert, oriented, non-focal HEENT:  /AT, PERRL, unable to appreciate JVD Cardiovascular:  RRR, no MRG Lungs:  Distant breath sounds. No distress. Of BiPAP. On room air with sats low 90s.  Abdomen:  Soft, non-distended, non-tender.  Musculoskeletal:  No acute deformity. Skin:  Bilateral lower extremities with redness, edema, and are scaly in nature. Chronic/at baseline per patient.    Labs/imaging that I have personally reviewed  (right click and "Reselect all SmartList Selections" daily)   ABG improved now showing chronic hypercarbic  failure.   WBC 5.5, Creatinine 1.3 not much above baseline 1.1, hemoglobin 11.6    Resolved Hospital Problem list      Assessment & Plan:   Acute on Chronic Hypercarbic Respiratory Failure secondary to decompensated OHS/OSA - Continue QHS and PRN BiPAP - His wife is planning to bring in his Trilogy for mask fit testing - No clear evidence of pneumonia. Hold off on antibiotics for now.  - on 2L nocturnal oxygen at home. Sat goal 88-95%. Currently meeting on room air.   Aortic Stenosis s/p TAVR Acute Decompensated HFpEF Atrial Fibrillation - on eliquis - resume home lasix 20mg  BID - echocardiogram pending - continue eliquis.   AKI: creatinine improving from admission: possible cardiorenal syndrome. He does not look dry on exam - Diuresis as above - Follow BMP  COPD without acute exacerbation - Resume home trellegy  - continue bronchodilator  Venous stasis - supportive care  Patient updated at bedside.   Best practice (right click and "Reselect all SmartList Selections" daily)  Diet:  Oral Pain/Anxiety/Delirium protocol (if indicated): No VAP protocol (if indicated): Not indicated DVT prophylaxis: Systemic AC GI prophylaxis: N/A Glucose control:  SSI No Central venous access:  N/A Arterial line:  N/A Foley:  N/A Mobility:  OOB  PT consulted: N/A Last date of multidisciplinary goals of care discussion []  Code Status:  full code Disposition: ICU   Critical care time: N/a  Georgann Housekeeper, AGACNP-BC [place] for personal pager PCCM on call pager [phone] until 7pm. Please call Elink 7p-7a. [phone]  02/13/2021 9:17 AM

## 2021-02-13 NOTE — Progress Notes (Signed)
Pt placed on home unit cpap with 3L O2 and he is tolerating it well at this time.

## 2021-02-13 NOTE — Telephone Encounter (Signed)
Spoke to [name] with [employer] and requested inpatient mask fit. [name] is going to look into this further and call back with update.  Urgent referral has been placed.

## 2021-02-13 NOTE — Discharge Summary (Signed)
Physician Discharge Summary       Patient ID: [name] MRN: [id number] DOB/AGE: 05/05/1941 80 y.o.  Admit date: 02/12/2021 Discharge date: 02/14/2021  Discharge Diagnoses:  Active Problems:   Respiratory failure (HCC)   Acute on chronic respiratory failure with hypoxia and hypercapnia (HCC)   History of Present Illness: 80 y/o M who presented to [facility] with reports of increased lethargy.    At baseline, the patient lives at home with his wife. He can ambulate but largely uses a wheelchair.  He carries a significant cardiac & respiratory history as below.   The patient was unable to provide history, wife at bedside provided information.  She reported the patient had difficulty staying awake for the last 2 weeks during the day - repeatedly falling asleep & has been confused with difficulty remembering facts.  She reported he had indicated feelings of lightheadedness in the last week and chronic cough. He is compliant with medications and recommended therapies.  He notes that he has been having trouble with his mask fitting for BIPAP and had tried to get an appointment to see Dr. Annamaria Boots for this, but it was 3 months away. Reports significant leaking.   On presentation to the ER, he was noted to have soft normal blood pressures and saturations in the 70's.  He was initially placed on 3L with improved in saturations.  He remained lethargic and due to mental status / marginal saturations, was placed on BiPAP.  Initial ABG showed acute on chronic hypercarbic respiratory failure (ph 7.30 / CO2 77 after 2 hours of BiPAP).  Initial labs demonstrated elevated CO2 on BMP (36), BUN 41 / Sr Cr 1.49 (up from baseline ~ 1), BNP 797, troponin 53, lactic acid 1.8, WBC 8.3, Hgb 12.1 and platelets 148.  CXR review shows low lung volumes, RLL opacity / effusion.  Hospital Course:  He was admitted to the critical care service at [facility]. At the time of arrival he was still  quite groggy on BiPAP. There was not felt to be a strong indication of infection and antibiotics were not continued. He was treated for his principal diagnosis of decompensated OSA/OHS with optimized BiPAP settings and a one time dose of 40mg  IV lasix. The following morning he was much more awake and alert. BiPAP was removed and the patient had no respiratory distress. He was able to describe the recent issues with his home Trilogy ventilatory, mainly with the mask and seal. [facility] was contacted and was able to do an inpatient fit test resulting in use of full facemask size large. On 5/28 he was deemed a candidate for discharge with plans for resuming home vent.     Discharge Plan by active problems   Acute on Chronic Hypercarbic Respiratory Failure secondary to decompensated OHS/OSA - Continue home vent VPAP 12/12 per Dr. Janee Morn instructions QHS/PRN - 2LPM bleed-in to vent - New mask from [facility], full face mask size large - Has follow up in the pulmonary clinic 6/1, and OV with Dr Annamaria Boots 6/7  Aortic Stenosis s/p TAVR Acute Decompensated HFpEF Atrial Fibrillation - on eliquis - Continue home meds lasix, Eliquis, atorvastatin  AKI: improving, Cr back to baseline - Repeat labs at follow up.   COPD without acute exacerbation - trellegy ellipta  Venous stasis - supportive care    Significant Hospital tests/ studies   5/27 Echo showed severe calcific mitral stenosis. LVEF 75%. Moderate concentric left ventricular hypertrophy. RV systolic  function mildly reduced. Mildly elevated PASP.   Consults   Discharge Exam: BP 138/74   Pulse 77   Temp 98.3 F (36.8 C) (Axillary)   Resp (!) 22   Ht 6\' 2"  (1.88 m)   Wt (!) 176.5 kg   SpO2 (!) 89%   BMI 49.96 kg/m   General:no acute distress, resting in bed, obese male  HEENT: [place]/AT, moist mucous membranes, sclera anicteric  Neuro:A&O x 3, moving all extremities  CV: rrr, s1s2, no murmurs  PULM:clear to  auscultation bilaterally. No wheezing  GI: soft, non-tender, non-distended, BS+  Extremities: warm, chronic venous stasis changes bilaterally  Skin: no rashes    Labs at discharge Lab Results  Component Value Date   CREATININE 1.17 02/14/2021   BUN 31 (H) 02/14/2021   NA 140 02/14/2021   K 3.8 02/14/2021   CL 97 (L) 02/14/2021   CO2 37 (H) 02/14/2021   Lab Results  Component Value Date   WBC 6.6 02/14/2021   HGB 11.5 (L) 02/14/2021   HCT 36.7 (L) 02/14/2021   MCV 106.7 (H) 02/14/2021   PLT 111 (L) 02/14/2021   Lab Results  Component Value Date   ALT 21 02/12/2021   AST 23 02/12/2021   ALKPHOS 65 02/12/2021   BILITOT 0.7 02/12/2021   Lab Results  Component Value Date   INR 1.2 02/12/2021   INR 1.32 06/10/2015   INR 1.05 06/06/2015    Current radiology studies DG Chest Port 1 View  Result Date: 02/12/2021 CLINICAL DATA:  Question sepsis. EXAM: PORTABLE CHEST 1 VIEW COMPARISON:  05/08/2020 FINDINGS: Cardiac enlargement. Dual lead pacemaker. No significant edema. TAVR. Right lower lobe airspace density is unchanged from the prior study and likely is atelectasis or scarring. Possible small right pleural effusion. Negative for edema or pneumonia IMPRESSION: Mild right lower lobe airspace density unchanged from the prior study. No superimposed acute abnormality. Electronically Signed   By: Franchot Gallo M.D.   On: 02/12/2021 13:25   ECHOCARDIOGRAM COMPLETE  Result Date: 02/13/2021    ECHOCARDIOGRAM REPORT   Patient Name:   [name] Date of Exam: 02/13/2021 Medical Rec #:  [id number]     Height:       74.0 in Accession #:    [id number]    Weight:       389.1 lb Date of Birth:  06-17-1941    BSA:          2.884 m Patient Age:    80 years      BP:           126/58 mmHg Patient Gender: M             HR:           56 bpm. Exam Location:  Inpatient Procedure: 2D Echo, Cardiac Doppler and Color Doppler Indications:    Respiratory distress  History:        Patient has prior history  of Echocardiogram examinations, most                 recent 05/09/2020. Risk Factors:Dyslipidemia and Hypertension.  Sonographer:    Cammy Brochure Referring Phys: [id number] [place]  1. Severe calcific MS is present. MG 16 mmHG @ 77 bpm. MVA 1.02 cm2, DI 0.35. The mitral valve is degenerative. No evidence of mitral valve regurgitation. Severe mitral stenosis. Severe mitral annular calcification.  2. Left ventricular ejection fraction, by estimation, is >75%. The left ventricle has hyperdynamic function.  The left ventricle has no regional wall motion abnormalities. There is moderate concentric left ventricular hypertrophy. Left ventricular diastolic function could not be evaluated. There is the interventricular septum is flattened in systole and diastole, consistent with right ventricular pressure and volume overload.  3. Right ventricular systolic function is mildly reduced. The right ventricular size is moderately enlarged. There is mildly elevated pulmonary artery systolic pressure. The estimated right ventricular systolic pressure is 62.1 mmHg.  4. Left atrial size was moderately dilated.  5. Right atrial size was mildly dilated.  6. 26 mm S3. Vmax 2.2 m/s, MG 10 mmHG, EOA 2.0 cm2. No regurgitation. Normal prosthesis . The aortic valve has been repaired/replaced. Aortic valve regurgitation is not visualized. Procedure Date: 2016. Echo findings are consistent with normal structure  and function of the aortic valve prosthesis.  7. The inferior vena cava is normal in size with greater than 50% respiratory variability, suggesting right atrial pressure of 3 mmHg. Comparison(s): No significant change from prior study. FINDINGS  Left Ventricle: Left ventricular ejection fraction, by estimation, is >75%. The left ventricle has hyperdynamic function. The left ventricle has no regional wall motion abnormalities. The left ventricular internal cavity size was normal in size. There is moderate concentric  left ventricular hypertrophy. The interventricular septum is flattened in systole and diastole, consistent with right ventricular pressure and volume overload. Left ventricular diastolic function could not be evaluated due to mitral annular calcification (moderate or greater). Left ventricular diastolic function could not be evaluated. Right Ventricle: The right ventricular size is moderately enlarged. No increase in right ventricular wall thickness. Right ventricular systolic function is mildly reduced. There is mildly elevated pulmonary artery systolic pressure. The tricuspid regurgitant velocity is 2.90 m/s, and with an assumed right atrial pressure of 3 mmHg, the estimated right ventricular systolic pressure is 30.8 mmHg. Left Atrium: Left atrial size was moderately dilated. Right Atrium: Right atrial size was mildly dilated. Pericardium: Trivial pericardial effusion is present. Mitral Valve: Severe calcific MS is present. MG 16 mmHG @ 77 bpm. MVA 1.02 cm2, DI 0.35. The mitral valve is degenerative in appearance. Severe mitral annular calcification. No evidence of mitral valve regurgitation. Severe mitral valve stenosis. MV peak  gradient, 34.3 mmHg. The mean mitral valve gradient is 16.0 mmHg with average heart rate of 77 bpm. Tricuspid Valve: The tricuspid valve is grossly normal. Tricuspid valve regurgitation is mild . No evidence of tricuspid stenosis. Aortic Valve: 26 mm S3. Vmax 2.2 m/s, MG 10 mmHG, EOA 2.0 cm2. No regurgitation. Normal prosthesis. The aortic valve has been repaired/replaced. Aortic valve regurgitation is not visualized. Aortic valve mean gradient measures 10.0 mmHg. Aortic valve peak gradient measures 19.4 mmHg. Aortic valve area, by VTI measures 2.00 cm. There is a 26 mm Sapien prosthetic, stented (TAVR) valve present in the aortic position. Echo findings are consistent with normal structure and function of the aortic valve prosthesis. Pulmonic Valve: The pulmonic valve was grossly  normal. Pulmonic valve regurgitation is not visualized. No evidence of pulmonic stenosis. Aorta: The aortic root is normal in size and structure. Venous: The inferior vena cava is normal in size with greater than 50% respiratory variability, suggesting right atrial pressure of 3 mmHg. IAS/Shunts: The atrial septum is grossly normal. Additional Comments: A device lead is visualized in the right atrium and right ventricle.  LEFT VENTRICLE PLAX 2D LVIDd:         5.40 cm  Diastology LVIDs:         3.40 cm  LV  e' lateral: 4.68 cm/s LV PW:         1.50 cm LV IVS:        1.50 cm LVOT diam:     1.90 cm LV SV:         84 LV SV Index:   29 LVOT Area:     2.84 cm  RIGHT VENTRICLE             IVC RV Basal diam:  5.30 cm     IVC diam: 2.10 cm RV S prime:     13.50 cm/s TAPSE (M-mode): 1.9 cm LEFT ATRIUM              Index       RIGHT ATRIUM           Index LA diam:        5.00 cm  1.73 cm/m  RA Area:     26.10 cm LA Vol (A2C):   129.0 ml 44.73 ml/m RA Volume:   86.30 ml  29.92 ml/m LA Vol (A4C):   132.0 ml 45.77 ml/m LA Biplane Vol: 133.0 ml 46.12 ml/m  AORTIC VALVE AV Area (Vmax):    2.23 cm AV Area (Vmean):   1.99 cm AV Area (VTI):     2.00 cm AV Vmax:           220.00 cm/s AV Vmean:          147.000 cm/s AV VTI:            0.420 m AV Peak Grad:      19.4 mmHg AV Mean Grad:      10.0 mmHg LVOT Vmax:         173.00 cm/s LVOT Vmean:        103.000 cm/s LVOT VTI:          0.296 m LVOT/AV VTI ratio: 0.70  AORTA Ao Root diam: 2.60 cm MITRAL VALVE             TRICUSPID VALVE MV Area VTI:  1.02 cm   TR Peak grad:   33.6 mmHg MV Peak grad: 34.3 mmHg  TR Vmax:        290.00 cm/s MV Mean grad: 16.0 mmHg MV Vmax:      2.93 m/s   SHUNTS MV Vmean:     184.0 cm/s Systemic VTI:  0.30 m MV VTI:       0.82 m     Systemic Diam: 1.90 cm Eleonore Chiquito MD Electronically signed by Eleonore Chiquito MD Signature Date/Time: 02/13/2021/3:23:21 PM    Final     Disposition: Discharge home.      Allergies as of 02/14/2021      Reactions    Sulfa Antibiotics    Other reaction(s): Unknown   Coumadin [warfarin Sodium] Other (See Comments)   States he can't be on this-bleeds out      Medication List    STOP taking these medications   doxycycline 100 MG tablet Commonly known as: VIBRA-TABS   Moderna COVID-19 Vaccine 100 MCG/0.5ML injection Generic drug: COVID-19 mRNA vaccine (Moderna)     TAKE these medications   acetaminophen 500 MG tablet Commonly known as: TYLENOL Take 2,000 mg by mouth 2 (two) times daily.   albuterol 108 (90 Base) MCG/ACT inhaler Commonly known as: VENTOLIN HFA TAKE 2 PUFFS BY MOUTH EVERY 6 HOURS AS NEEDED What changed:   how much to take  how to take this  when to take this  reasons to take this  additional instructions   allopurinol 100 MG tablet Commonly known as: ZYLOPRIM Take 1 tablet (100 mg total) by mouth daily.   atorvastatin 80 MG tablet Commonly known as: LIPITOR 1 tablet by mouth daily What changed:   how much to take  how to take this  when to take this  additional instructions   AZO-STANDARD PO Take 1 tablet by mouth 2 (two) times daily.   CENTRUM SILVER PO Take 1 tablet by mouth daily.   cetirizine 10 MG tablet Commonly known as: ZYRTEC Take 1 tablet (10 mg total) by mouth daily.   CoQ10 100 MG Caps Take 100 mg by mouth daily.   Eliquis 5 MG Tabs tablet Generic drug: apixaban TAKE 1 TABLET BY MOUTH TWICE A DAY What changed: how much to take   famotidine 40 MG tablet Commonly known as: PEPCID TAKE 1 TABLET BY MOUTH EVERY DAY   furosemide 20 MG tablet Commonly known as: LASIX Take 1 tablet (20 mg total) by mouth 2 (two) times daily.   gabapentin 300 MG capsule Commonly known as: NEURONTIN Take 1 capsule (300 mg total) by mouth 3 (three) times daily. What changed:   how much to take  when to take this   Hematinic/Folic Acid 570-1 MG Tabs Generic drug: Ferrous Fumarate-Folic Acid Take 1 tablet by mouth daily.   polyethylene glycol 17  g packet Commonly known as: MIRALAX / GLYCOLAX Take 17 g by mouth daily.   TART CHERRY ADVANCED PO Take 1,200 mg by mouth 2 (two) times a day.   Trelegy Ellipta 100-62.5-25 MCG/INH Aepb Generic drug: Fluticasone-Umeclidin-Vilant Inhale 1 puff into the lungs daily.   vitamin C 500 MG tablet Commonly known as: ASCORBIC ACID Take 500 mg by mouth daily.   Vitamin D3 125 MCG (5000 UT) Caps Take 1,000 Units by mouth daily.       Follow-up Information    Magdalen Spatz, NP Follow up on 02/18/2021.   Specialty: Pulmonary Disease Why: 3:30 PM - [facility] Contact information: [place] [phone]               Discharged Condition: good  30 minutes of time have been dedicated to discharge assessment, planning and discharge instructions.   Signed: Freda Jackson, MD [facility] Office: [phone]   See Amion for personal pager PCCM on call pager [phone] until 7pm. Please call Elink 7p-7a. [phone]

## 2021-02-13 NOTE — Telephone Encounter (Signed)
Please contact patient's DME company to schedule an urgent mask fitting session.   He has been admitted for acute on chronic respiratory failure due to issues with his trilogy home vent and mask fit issues.   Thanks, Wille Glaser

## 2021-02-13 NOTE — Telephone Encounter (Signed)
Spoke to [name] with [employer], who stated that she would do mask fit inpatient. [name] has been provided with room number.  Nothing further needed at this time.

## 2021-02-14 ENCOUNTER — Encounter (HOSPITAL_COMMUNITY): Payer: Self-pay

## 2021-02-14 ENCOUNTER — Other Ambulatory Visit: Payer: Self-pay

## 2021-02-14 ENCOUNTER — Emergency Department (HOSPITAL_COMMUNITY): Payer: Medicare HMO

## 2021-02-14 ENCOUNTER — Inpatient Hospital Stay (HOSPITAL_COMMUNITY)
Admission: EM | Admit: 2021-02-14 | Discharge: 2021-03-02 | Disposition: A | Discharge status: Home / Self Care | Payer: Medicare HMO | Source: Home / Self Care | Attending: Internal Medicine | Admitting: Internal Medicine

## 2021-02-14 DIAGNOSIS — J9621 Acute and chronic respiratory failure with hypoxia: Secondary | ICD-10-CM | POA: Diagnosis present

## 2021-02-14 DIAGNOSIS — E1122 Type 2 diabetes mellitus with diabetic chronic kidney disease: Secondary | ICD-10-CM | POA: Diagnosis present

## 2021-02-14 DIAGNOSIS — K5900 Constipation, unspecified: Secondary | ICD-10-CM | POA: Diagnosis present

## 2021-02-14 DIAGNOSIS — M21371 Foot drop, right foot: Secondary | ICD-10-CM | POA: Diagnosis present

## 2021-02-14 DIAGNOSIS — G253 Myoclonus: Secondary | ICD-10-CM | POA: Diagnosis present

## 2021-02-14 DIAGNOSIS — I4510 Unspecified right bundle-branch block: Secondary | ICD-10-CM | POA: Diagnosis not present

## 2021-02-14 DIAGNOSIS — Z79899 Other long term (current) drug therapy: Secondary | ICD-10-CM

## 2021-02-14 DIAGNOSIS — Z8042 Family history of malignant neoplasm of prostate: Secondary | ICD-10-CM

## 2021-02-14 DIAGNOSIS — R29898 Other symptoms and signs involving the musculoskeletal system: Secondary | ICD-10-CM | POA: Diagnosis not present

## 2021-02-14 DIAGNOSIS — N1831 Chronic kidney disease, stage 3a: Secondary | ICD-10-CM | POA: Diagnosis present

## 2021-02-14 DIAGNOSIS — E785 Hyperlipidemia, unspecified: Secondary | ICD-10-CM | POA: Diagnosis present

## 2021-02-14 DIAGNOSIS — I440 Atrioventricular block, first degree: Secondary | ICD-10-CM | POA: Diagnosis present

## 2021-02-14 DIAGNOSIS — E662 Morbid (severe) obesity with alveolar hypoventilation: Secondary | ICD-10-CM | POA: Diagnosis present

## 2021-02-14 DIAGNOSIS — G4733 Obstructive sleep apnea (adult) (pediatric): Secondary | ICD-10-CM | POA: Diagnosis present

## 2021-02-14 DIAGNOSIS — J918 Pleural effusion in other conditions classified elsewhere: Secondary | ICD-10-CM | POA: Diagnosis present

## 2021-02-14 DIAGNOSIS — Z6841 Body Mass Index (BMI) 40.0 and over, adult: Secondary | ICD-10-CM

## 2021-02-14 DIAGNOSIS — E7800 Pure hypercholesterolemia, unspecified: Secondary | ICD-10-CM | POA: Diagnosis present

## 2021-02-14 DIAGNOSIS — Z87891 Personal history of nicotine dependence: Secondary | ICD-10-CM

## 2021-02-14 DIAGNOSIS — I10 Essential (primary) hypertension: Secondary | ICD-10-CM | POA: Diagnosis present

## 2021-02-14 DIAGNOSIS — I959 Hypotension, unspecified: Secondary | ICD-10-CM | POA: Diagnosis not present

## 2021-02-14 DIAGNOSIS — I5033 Acute on chronic diastolic (congestive) heart failure: Secondary | ICD-10-CM | POA: Diagnosis present

## 2021-02-14 DIAGNOSIS — R0602 Shortness of breath: Secondary | ICD-10-CM | POA: Diagnosis not present

## 2021-02-14 DIAGNOSIS — D649 Anemia, unspecified: Secondary | ICD-10-CM | POA: Diagnosis present

## 2021-02-14 DIAGNOSIS — Z86718 Personal history of other venous thrombosis and embolism: Secondary | ICD-10-CM

## 2021-02-14 DIAGNOSIS — Z952 Presence of prosthetic heart valve: Secondary | ICD-10-CM

## 2021-02-14 DIAGNOSIS — I130 Hypertensive heart and chronic kidney disease with heart failure and stage 1 through stage 4 chronic kidney disease, or unspecified chronic kidney disease: Secondary | ICD-10-CM | POA: Diagnosis present

## 2021-02-14 DIAGNOSIS — Z09 Encounter for follow-up examination after completed treatment for conditions other than malignant neoplasm: Secondary | ICD-10-CM

## 2021-02-14 DIAGNOSIS — E66813 Obesity, class 3: Secondary | ICD-10-CM | POA: Diagnosis present

## 2021-02-14 DIAGNOSIS — G9340 Encephalopathy, unspecified: Secondary | ICD-10-CM | POA: Diagnosis present

## 2021-02-14 DIAGNOSIS — E1142 Type 2 diabetes mellitus with diabetic polyneuropathy: Secondary | ICD-10-CM | POA: Diagnosis present

## 2021-02-14 DIAGNOSIS — R823 Hemoglobinuria: Secondary | ICD-10-CM | POA: Diagnosis present

## 2021-02-14 DIAGNOSIS — J449 Chronic obstructive pulmonary disease, unspecified: Secondary | ICD-10-CM | POA: Diagnosis present

## 2021-02-14 DIAGNOSIS — J440 Chronic obstructive pulmonary disease with acute lower respiratory infection: Secondary | ICD-10-CM | POA: Diagnosis present

## 2021-02-14 DIAGNOSIS — R531 Weakness: Secondary | ICD-10-CM | POA: Diagnosis not present

## 2021-02-14 DIAGNOSIS — Z8261 Family history of arthritis: Secondary | ICD-10-CM

## 2021-02-14 DIAGNOSIS — I4819 Other persistent atrial fibrillation: Secondary | ICD-10-CM | POA: Diagnosis present

## 2021-02-14 DIAGNOSIS — Z20822 Contact with and (suspected) exposure to covid-19: Secondary | ICD-10-CM | POA: Diagnosis present

## 2021-02-14 DIAGNOSIS — J189 Pneumonia, unspecified organism: Secondary | ICD-10-CM | POA: Diagnosis present

## 2021-02-14 DIAGNOSIS — Z7901 Long term (current) use of anticoagulants: Secondary | ICD-10-CM

## 2021-02-14 DIAGNOSIS — R062 Wheezing: Secondary | ICD-10-CM | POA: Diagnosis not present

## 2021-02-14 DIAGNOSIS — J9601 Acute respiratory failure with hypoxia: Secondary | ICD-10-CM | POA: Diagnosis present

## 2021-02-14 DIAGNOSIS — Z8701 Personal history of pneumonia (recurrent): Secondary | ICD-10-CM

## 2021-02-14 DIAGNOSIS — D539 Nutritional anemia, unspecified: Secondary | ICD-10-CM | POA: Diagnosis present

## 2021-02-14 DIAGNOSIS — M109 Gout, unspecified: Secondary | ICD-10-CM | POA: Diagnosis present

## 2021-02-14 DIAGNOSIS — Z8619 Personal history of other infectious and parasitic diseases: Secondary | ICD-10-CM

## 2021-02-14 DIAGNOSIS — D329 Benign neoplasm of meninges, unspecified: Secondary | ICD-10-CM | POA: Diagnosis present

## 2021-02-14 DIAGNOSIS — M4802 Spinal stenosis, cervical region: Secondary | ICD-10-CM | POA: Diagnosis present

## 2021-02-14 DIAGNOSIS — Z9889 Other specified postprocedural states: Secondary | ICD-10-CM

## 2021-02-14 DIAGNOSIS — M1990 Unspecified osteoarthritis, unspecified site: Secondary | ICD-10-CM | POA: Diagnosis present

## 2021-02-14 DIAGNOSIS — R7989 Other specified abnormal findings of blood chemistry: Secondary | ICD-10-CM | POA: Diagnosis present

## 2021-02-14 DIAGNOSIS — N179 Acute kidney failure, unspecified: Secondary | ICD-10-CM | POA: Diagnosis present

## 2021-02-14 DIAGNOSIS — E876 Hypokalemia: Secondary | ICD-10-CM | POA: Diagnosis present

## 2021-02-14 DIAGNOSIS — R0902 Hypoxemia: Secondary | ICD-10-CM | POA: Diagnosis not present

## 2021-02-14 DIAGNOSIS — N183 Chronic kidney disease, stage 3 unspecified: Secondary | ICD-10-CM | POA: Diagnosis present

## 2021-02-14 DIAGNOSIS — I2510 Atherosclerotic heart disease of native coronary artery without angina pectoris: Secondary | ICD-10-CM | POA: Diagnosis present

## 2021-02-14 DIAGNOSIS — D638 Anemia in other chronic diseases classified elsewhere: Secondary | ICD-10-CM | POA: Diagnosis present

## 2021-02-14 DIAGNOSIS — K769 Liver disease, unspecified: Secondary | ICD-10-CM | POA: Diagnosis present

## 2021-02-14 DIAGNOSIS — I872 Venous insufficiency (chronic) (peripheral): Secondary | ICD-10-CM | POA: Diagnosis present

## 2021-02-14 DIAGNOSIS — I083 Combined rheumatic disorders of mitral, aortic and tricuspid valves: Secondary | ICD-10-CM | POA: Diagnosis present

## 2021-02-14 DIAGNOSIS — Z8546 Personal history of malignant neoplasm of prostate: Secondary | ICD-10-CM

## 2021-02-14 DIAGNOSIS — D696 Thrombocytopenia, unspecified: Secondary | ICD-10-CM | POA: Diagnosis present

## 2021-02-14 DIAGNOSIS — G5730 Lesion of lateral popliteal nerve, unspecified lower limb: Secondary | ICD-10-CM | POA: Diagnosis present

## 2021-02-14 LAB — CBC
HCT: 36.7 % — ABNORMAL LOW (ref 39.0–52.0)
HCT: 38.4 % — ABNORMAL LOW (ref 39.0–52.0)
Hemoglobin: 11.5 g/dL — ABNORMAL LOW (ref 13.0–17.0)
Hemoglobin: 12.2 g/dL — ABNORMAL LOW (ref 13.0–17.0)
MCH: 33.4 pg (ref 26.0–34.0)
MCH: 33.8 pg (ref 26.0–34.0)
MCHC: 31.3 g/dL (ref 30.0–36.0)
MCHC: 31.8 g/dL (ref 30.0–36.0)
MCV: 106.7 fL — ABNORMAL HIGH (ref 80.0–100.0)
MCV: 106.4 fL — ABNORMAL HIGH (ref 80.0–100.0)
Platelets: 111 10*3/uL — ABNORMAL LOW (ref 150–400)
Platelets: 131 10*3/uL — ABNORMAL LOW (ref 150–400)
RBC: 3.44 MIL/uL — ABNORMAL LOW (ref 4.22–5.81)
RBC: 3.61 MIL/uL — ABNORMAL LOW (ref 4.22–5.81)
RDW: 19.1 % — ABNORMAL HIGH (ref 11.5–15.5)
RDW: 19 % — ABNORMAL HIGH (ref 11.5–15.5)
WBC: 6.6 10*3/uL (ref 4.0–10.5)
WBC: 10.6 10*3/uL — ABNORMAL HIGH (ref 4.0–10.5)
nRBC: 0.6 % — ABNORMAL HIGH (ref 0.0–0.2)
nRBC: 0.2 % (ref 0.0–0.2)

## 2021-02-14 LAB — I-STAT VENOUS BLOOD GAS, ED
Acid-Base Excess: 11 mmol/L — ABNORMAL HIGH (ref 0.0–2.0)
Bicarbonate: 35.8 mmol/L — ABNORMAL HIGH (ref 20.0–28.0)
Calcium, Ion: 0.97 mmol/L — ABNORMAL LOW (ref 1.15–1.40)
HCT: 37 % — ABNORMAL LOW (ref 39.0–52.0)
Hemoglobin: 12.6 g/dL — ABNORMAL LOW (ref 13.0–17.0)
O2 Saturation: 90 %
Potassium: 4 mmol/L (ref 3.5–5.1)
Sodium: 137 mmol/L (ref 135–145)
TCO2: 37 mmol/L — ABNORMAL HIGH (ref 22–32)
pCO2, Ven: 47.5 mmHg (ref 44.0–60.0)
pH, Ven: 7.485 — ABNORMAL HIGH (ref 7.250–7.430)
pO2, Ven: 56 mmHg — ABNORMAL HIGH (ref 32.0–45.0)

## 2021-02-14 LAB — RESP PANEL BY RT-PCR (FLU A&B, COVID) ARPGX2
Influenza A by PCR: NEGATIVE
Influenza B by PCR: NEGATIVE
SARS Coronavirus 2 by RT PCR: NEGATIVE

## 2021-02-14 LAB — BASIC METABOLIC PANEL
Anion gap: 6 (ref 5–15)
Anion gap: 7 (ref 5–15)
BUN: 31 mg/dL — ABNORMAL HIGH (ref 8–23)
BUN: 32 mg/dL — ABNORMAL HIGH (ref 8–23)
CO2: 37 mmol/L — ABNORMAL HIGH (ref 22–32)
CO2: 36 mmol/L — ABNORMAL HIGH (ref 22–32)
Calcium: 9 mg/dL (ref 8.9–10.3)
Calcium: 8.9 mg/dL (ref 8.9–10.3)
Chloride: 97 mmol/L — ABNORMAL LOW (ref 98–111)
Chloride: 94 mmol/L — ABNORMAL LOW (ref 98–111)
Creatinine, Ser: 1.17 mg/dL (ref 0.61–1.24)
Creatinine, Ser: 1.67 mg/dL — ABNORMAL HIGH (ref 0.61–1.24)
GFR, Estimated: >60 mL/min (ref >60)
GFR, Estimated: 42 mL/min — ABNORMAL LOW (ref >60)
Glucose, Bld: 103 mg/dL — ABNORMAL HIGH (ref 70–99)
Glucose, Bld: 127 mg/dL — ABNORMAL HIGH (ref 70–99)
Potassium: 3.8 mmol/L (ref 3.5–5.1)
Potassium: 3.9 mmol/L (ref 3.5–5.1)
Sodium: 140 mmol/L (ref 135–145)
Sodium: 137 mmol/L (ref 135–145)

## 2021-02-14 LAB — LACTIC ACID, PLASMA: Lactic Acid, Venous: 1.6 mmol/L (ref 0.5–1.9)

## 2021-02-14 LAB — BRAIN NATRIURETIC PEPTIDE: B Natriuretic Peptide: 642.9 pg/mL — ABNORMAL HIGH (ref 0.0–100.0)

## 2021-02-14 MED ORDER — ALBUTEROL SULFATE HFA 108 (90 BASE) MCG/ACT IN AERS
2.0000 | INHALATION_SPRAY | RESPIRATORY_TRACT | Status: DC | PRN
  Filled 2021-02-14: qty 6.7

## 2021-02-14 NOTE — ED Triage Notes (Signed)
Pt was discharged from ICU today around 1330 from [facility].

## 2021-02-14 NOTE — ED Notes (Addendum)
Pt's O2 sat decreased to 80's on home bipap machine. RT adjusted oxygen on home bipap.

## 2021-02-14 NOTE — Progress Notes (Signed)
Discharge instructions provided to and discussed with patient, wife, and son. All questions answered. IV removed. Nursing staff assisted pt with walker to personal wheelchair and pt escorted to main entrance by RN at 1235.

## 2021-02-14 NOTE — ED Triage Notes (Signed)
[facility] from home where pt called due to being unable to get up out of toilet and had sudden onset weakness and SOB. Pt was just d/c from [facility] from ICU for retainment of CO2 from cpap. Pt initially 78% on RA PTA. 97% on NRB.

## 2021-02-15 DIAGNOSIS — I5033 Acute on chronic diastolic (congestive) heart failure: Secondary | ICD-10-CM | POA: Diagnosis not present

## 2021-02-15 DIAGNOSIS — J189 Pneumonia, unspecified organism: Secondary | ICD-10-CM | POA: Diagnosis not present

## 2021-02-15 DIAGNOSIS — I130 Hypertensive heart and chronic kidney disease with heart failure and stage 1 through stage 4 chronic kidney disease, or unspecified chronic kidney disease: Secondary | ICD-10-CM | POA: Diagnosis not present

## 2021-02-15 DIAGNOSIS — E662 Morbid (severe) obesity with alveolar hypoventilation: Secondary | ICD-10-CM | POA: Diagnosis present

## 2021-02-15 DIAGNOSIS — I959 Hypotension, unspecified: Secondary | ICD-10-CM | POA: Diagnosis not present

## 2021-02-15 DIAGNOSIS — R0602 Shortness of breath: Secondary | ICD-10-CM | POA: Diagnosis not present

## 2021-02-15 DIAGNOSIS — J9621 Acute and chronic respiratory failure with hypoxia: Secondary | ICD-10-CM | POA: Diagnosis present

## 2021-02-15 DIAGNOSIS — D696 Thrombocytopenia, unspecified: Secondary | ICD-10-CM | POA: Diagnosis not present

## 2021-02-15 DIAGNOSIS — J9601 Acute respiratory failure with hypoxia: Secondary | ICD-10-CM | POA: Diagnosis not present

## 2021-02-15 DIAGNOSIS — E1122 Type 2 diabetes mellitus with diabetic chronic kidney disease: Secondary | ICD-10-CM | POA: Diagnosis present

## 2021-02-15 DIAGNOSIS — Z20822 Contact with and (suspected) exposure to covid-19: Secondary | ICD-10-CM | POA: Diagnosis not present

## 2021-02-15 DIAGNOSIS — N183 Chronic kidney disease, stage 3 unspecified: Secondary | ICD-10-CM | POA: Diagnosis present

## 2021-02-15 DIAGNOSIS — G9340 Encephalopathy, unspecified: Secondary | ICD-10-CM | POA: Diagnosis not present

## 2021-02-15 LAB — URINALYSIS, ROUTINE W REFLEX MICROSCOPIC
Bilirubin Urine: NEGATIVE
Glucose, UA: NEGATIVE mg/dL
Ketones, ur: NEGATIVE mg/dL
Nitrite: NEGATIVE
Protein, ur: 100 mg/dL — AB
RBC / HPF: >50 RBC/hpf — ABNORMAL HIGH (ref 0–5)
Specific Gravity, Urine: 1.017 (ref 1.005–1.030)
WBC, UA: >50 WBC/hpf — ABNORMAL HIGH (ref 0–5)
pH: 8 (ref 5.0–8.0)

## 2021-02-15 LAB — CBC
HCT: 36 % — ABNORMAL LOW (ref 39.0–52.0)
Hemoglobin: 11.3 g/dL — ABNORMAL LOW (ref 13.0–17.0)
MCH: 33.4 pg (ref 26.0–34.0)
MCHC: 31.4 g/dL (ref 30.0–36.0)
MCV: 106.5 fL — ABNORMAL HIGH (ref 80.0–100.0)
Platelets: 100 10*3/uL — ABNORMAL LOW (ref 150–400)
RBC: 3.38 MIL/uL — ABNORMAL LOW (ref 4.22–5.81)
RDW: 19 % — ABNORMAL HIGH (ref 11.5–15.5)
WBC: 7.7 10*3/uL (ref 4.0–10.5)
nRBC: 0 % (ref 0.0–0.2)

## 2021-02-15 LAB — COMPREHENSIVE METABOLIC PANEL
ALT: 19 U/L (ref 0–44)
AST: 27 U/L (ref 15–41)
Albumin: 2.8 g/dL — ABNORMAL LOW (ref 3.5–5.0)
Alkaline Phosphatase: 48 U/L (ref 38–126)
Anion gap: 7 (ref 5–15)
BUN: 35 mg/dL — ABNORMAL HIGH (ref 8–23)
CO2: 33 mmol/L — ABNORMAL HIGH (ref 22–32)
Calcium: 8.5 mg/dL — ABNORMAL LOW (ref 8.9–10.3)
Chloride: 98 mmol/L (ref 98–111)
Creatinine, Ser: 1.65 mg/dL — ABNORMAL HIGH (ref 0.61–1.24)
GFR, Estimated: 42 mL/min — ABNORMAL LOW (ref >60)
Glucose, Bld: 94 mg/dL (ref 70–99)
Potassium: 4 mmol/L (ref 3.5–5.1)
Sodium: 138 mmol/L (ref 135–145)
Total Bilirubin: 1.3 mg/dL — ABNORMAL HIGH (ref 0.3–1.2)
Total Protein: 5.3 g/dL — ABNORMAL LOW (ref 6.5–8.1)

## 2021-02-15 LAB — PROCALCITONIN: Procalcitonin: 0.44 ng/mL

## 2021-02-15 LAB — MRSA PCR SCREENING: MRSA by PCR: NEGATIVE

## 2021-02-15 MED ORDER — VANCOMYCIN HCL 1750 MG/350ML IV SOLN: 1750.0000 mg | INTRAVENOUS | Status: DC

## 2021-02-15 MED ORDER — FUROSEMIDE 20 MG PO TABS: 20.0000 mg | ORAL_TABLET | Freq: Two times a day (BID) | ORAL | Status: DC

## 2021-02-15 MED ORDER — ASCORBIC ACID 500 MG PO TABS
500.0000 mg | ORAL_TABLET | Freq: Every day | ORAL | Status: DC
  Administered 2021-02-15 – 2021-03-02 (×16): 500 mg via ORAL
  Filled 2021-02-15 (×16): qty 1

## 2021-02-15 MED ORDER — FLUTICASONE FUROATE-VILANTEROL 100-25 MCG/INH IN AEPB
1.0000 | INHALATION_SPRAY | Freq: Every day | RESPIRATORY_TRACT | Status: DC
  Administered 2021-02-15 – 2021-03-02 (×16): 1 via RESPIRATORY_TRACT
  Filled 2021-02-15 (×3): qty 28

## 2021-02-15 MED ORDER — GABAPENTIN 300 MG PO CAPS
300.0000 mg | ORAL_CAPSULE | Freq: Two times a day (BID) | ORAL | Status: DC
  Administered 2021-02-15 – 2021-03-02 (×31): 300 mg via ORAL
  Filled 2021-02-15 (×13): qty 1
  Filled 2021-02-15: qty 3
  Filled 2021-02-15 (×17): qty 1

## 2021-02-15 MED ORDER — SODIUM CHLORIDE 0.9 % IV SOLN
1.0000 g | Freq: Once | INTRAVENOUS | Status: DC
  Filled 2021-02-15: qty 1

## 2021-02-15 MED ORDER — UMECLIDINIUM BROMIDE 62.5 MCG/INH IN AEPB
1.0000 | INHALATION_SPRAY | Freq: Every day | RESPIRATORY_TRACT | Status: DC
  Administered 2021-02-15 – 2021-03-02 (×16): 1 via RESPIRATORY_TRACT
  Filled 2021-02-15 (×3): qty 7

## 2021-02-15 MED ORDER — ALLOPURINOL 100 MG PO TABS
100.0000 mg | ORAL_TABLET | Freq: Every day | ORAL | Status: DC
  Administered 2021-02-15 – 2021-03-02 (×16): 100 mg via ORAL
  Filled 2021-02-15 (×16): qty 1

## 2021-02-15 MED ORDER — SODIUM CHLORIDE 0.9 % IV SOLN
2.0000 g | Freq: Once | INTRAVENOUS | Status: AC
  Administered 2021-02-15: 2 g via INTRAVENOUS
  Filled 2021-02-15: qty 2

## 2021-02-15 MED ORDER — ACETAMINOPHEN 500 MG PO TABS: 1000.0000 mg | ORAL_TABLET | Freq: Four times a day (QID) | ORAL | Status: DC | PRN

## 2021-02-15 MED ORDER — ATORVASTATIN CALCIUM 80 MG PO TABS
80.0000 mg | ORAL_TABLET | Freq: Every day | ORAL | Status: DC
  Administered 2021-02-15 – 2021-03-02 (×16): 80 mg via ORAL
  Filled 2021-02-15 (×16): qty 1

## 2021-02-15 MED ORDER — SODIUM CHLORIDE 0.9 % IV SOLN
2.0000 g | Freq: Three times a day (TID) | INTRAVENOUS | Status: AC
  Administered 2021-02-15 – 2021-02-21 (×20): 2 g via INTRAVENOUS
  Filled 2021-02-15 (×20): qty 2

## 2021-02-15 MED ORDER — ACETAMINOPHEN 650 MG RE SUPP: 650.0000 mg | Freq: Four times a day (QID) | RECTAL | Status: DC | PRN

## 2021-02-15 MED ORDER — FAMOTIDINE 20 MG PO TABS
40.0000 mg | ORAL_TABLET | Freq: Every day | ORAL | Status: DC
  Administered 2021-02-15 – 2021-03-02 (×16): 40 mg via ORAL
  Filled 2021-02-15 (×16): qty 2

## 2021-02-15 MED ORDER — APIXABAN 5 MG PO TABS
5.0000 mg | ORAL_TABLET | Freq: Two times a day (BID) | ORAL | Status: DC
  Administered 2021-02-15 – 2021-03-02 (×31): 5 mg via ORAL
  Filled 2021-02-15 (×31): qty 1

## 2021-02-15 MED ORDER — FLUTICASONE-UMECLIDIN-VILANT 100-62.5-25 MCG/INH IN AEPB: 1.0000 | INHALATION_SPRAY | Freq: Every day | RESPIRATORY_TRACT | Status: DC

## 2021-02-15 MED ORDER — ALBUTEROL SULFATE (2.5 MG/3ML) 0.083% IN NEBU
2.5000 mg | INHALATION_SOLUTION | RESPIRATORY_TRACT | Status: DC | PRN
  Administered 2021-02-16: 2.5 mg via RESPIRATORY_TRACT
  Filled 2021-02-15: qty 3

## 2021-02-15 MED ORDER — VANCOMYCIN HCL 10 G IV SOLR
2500.0000 mg | Freq: Once | INTRAVENOUS | Status: AC
  Administered 2021-02-15: 2500 mg via INTRAVENOUS
  Filled 2021-02-15: qty 2500

## 2021-02-15 MED ORDER — POLYETHYLENE GLYCOL 3350 17 G PO PACK
17.0000 g | PACK | Freq: Every day | ORAL | Status: DC
  Administered 2021-02-15 – 2021-03-01 (×7): 17 g via ORAL
  Filled 2021-02-15 (×10): qty 1

## 2021-02-15 NOTE — Evaluation (Signed)
Physical Therapy Evaluation Patient Details Name: [name] MRN: [id number] DOB: [birth year] Today's Date: 02/15/2021   History of Present Illness  Pt is a 80 y/o male with extensive past medical history including OSA, morbid obesity, atrial fibrillation, CHF who presents with weakness and SOB.  Patient was just discharged from the hospital on 5/27 after an ICU stay for acute on chronic respiratory failure.  EMS was called out to his house because his wife had a fall and while EMS was there, patient was unable to get up off the toilet and began having weakness and shortness of breath.  EMS recommended that he come back for evaluation. SpO2 at 78% on room air prior to arrival.    Clinical Impression  Pt presented supine in bed with HOB elevated, awake and willing to participate in therapy session. Prior to admission, pt reported that he was able to ambulate household distances with use of his bilateral PFRW and utilized a power w/c for community distances. Pt stated that he spends most of his time in a lift chair at home. He reported that he was able to sponge bathe himself and required assistance from family for lower body dressing. At the time of evaluation, pt required mod A for bed mobility and was able to sit at EOB for ~12 minutes with close min guard for safety. He was limited with functional mobility secondary to fatigue and weakness. Pt on 3L of O2 throughout with SpO2 maintaining >93% and HR stable. Based on pt's current functional mobility status and additional recent hospitalization with residual weakness, PT recommending pt d/c to SNF for further intensive therapy services prior to returning home with family support. Pt would continue to benefit from skilled physical therapy services at this time while admitted and after d/c to address the below listed limitations in order to improve overall safety and independence with functional mobility.     Follow Up Recommendations SNF     Equipment Recommendations  None recommended by PT    Recommendations for Other Services       Precautions / Restrictions Precautions Precautions: Fall Restrictions Weight Bearing Restrictions: No      Mobility  Bed Mobility Overal bed mobility: Needs Assistance Bed Mobility: Supine to Sit;Sit to Supine;Rolling Rolling: Min assist   Supine to sit: Mod assist Sit to supine: Mod assist   General bed mobility comments: increased time and effort needed, HOB elevated, use of bed rails and bed features, heavy physical assistance needed for trunk elevation and to return bilateral LEs onto bed    Transfers                 General transfer comment: unable to tolerate this date  Ambulation/Gait                Stairs            Wheelchair Mobility    Modified Rankin (Stroke Patients Only)       Balance Overall balance assessment: Needs assistance Sitting-balance support: Feet supported;Single extremity supported;Bilateral upper extremity supported Sitting balance-Leahy Scale: Poor Sitting balance - Comments: pt able to achieve upright sitting at EOB with mod A from PT and progressing to needing only min guard to maintain sitting position for ~12 mins                                     Pertinent Vitals/Pain Pain Assessment: No/denies  pain    Home Living Family/patient expects to be discharged to:: Private residence Living Arrangements: Spouse/significant other;Children Available Help at Discharge: Family;Available 24 hours/day Type of Home: House Home Access: Ramped entrance     Home Layout: One level Home Equipment: Youth worker - 2 wheels Additional Comments: has a lift chair    Prior Function Level of Independence: Needs assistance   Gait / Transfers Assistance Needed: walks with a bilateral PFRW inside his home and uses a power w/c when out in the community  ADL's / Homemaking Assistance Needed: wife and son  assist with lower body dressing; sponge bathes        Hand Dominance        Extremity/Trunk Assessment   Upper Extremity Assessment Upper Extremity Assessment: Defer to OT evaluation    Lower Extremity Assessment Lower Extremity Assessment: Generalized weakness       Communication   Communication: No difficulties  Cognition Arousal/Alertness: Awake/alert Behavior During Therapy: WFL for tasks assessed/performed Overall Cognitive Status: Within Functional Limits for tasks assessed                                 General Comments: not formally assessed but [facility] for general conversation      General Comments      Exercises     Assessment/Plan    PT Assessment Patient needs continued PT services  PT Problem List Decreased strength;Decreased activity tolerance;Decreased mobility;Decreased balance;Decreased coordination;Decreased knowledge of use of DME;Decreased safety awareness;Decreased knowledge of precautions       PT Treatment Interventions DME instruction;Gait training;Functional mobility training;Therapeutic activities;Therapeutic exercise;Balance training;Neuromuscular re-education;Patient/family education    PT Goals (Current goals can be found in the Care Plan section)  Acute Rehab PT Goals Patient Stated Goal: to get stronger PT Goal Formulation: With patient Time For Goal Achievement: 03/01/21 Potential to Achieve Goals: Fair    Frequency Min 3X/week   Barriers to discharge        Co-evaluation               AM-PAC PT "6 Clicks" Mobility  Outcome Measure Help needed turning from your back to your side while in a flat bed without using bedrails?: A Lot Help needed moving from lying on your back to sitting on the side of a flat bed without using bedrails?: A Lot Help needed moving to and from a bed to a chair (including a wheelchair)?: A Lot Help needed standing up from a chair using your arms (e.g., wheelchair or bedside  chair)?: A Lot Help needed to walk in hospital room?: Total Help needed climbing 3-5 steps with a railing? : Total 6 Click Score: 10    End of Session Equipment Utilized During Treatment: Oxygen Activity Tolerance: Patient limited by fatigue Patient left: in bed;with call bell/phone within reach;with bed alarm set Nurse Communication: Mobility status PT Visit Diagnosis: Other abnormalities of gait and mobility (R26.89)    Time: 9326-7124 PT Time Calculation (min) (ACUTE ONLY): 45 min   Charges:   PT Evaluation $PT Eval Moderate Complexity: 1 Mod PT Treatments $Therapeutic Activity: 23-37 mins        Anastasio Champion, DPT  Acute Rehabilitation Services Pager [phone] Office [place] 02/15/2021, 2:45 PM

## 2021-02-15 NOTE — TOC Initial Note (Signed)
Transition of Care [facility]) - Initial/Assessment Note    Patient Details  Name: [name] MRN: [id number] Date of Birth: 10-16-1940  [facility]) CM/SW Contact:    Trula Ore, [place] Phone Number: 02/15/2021, 3:26 PM  Clinical Narrative:                  CSW received consult for possible SNF placement at time of discharge. CSW spoke with patient regarding PT recommendation of SNF placement at time of discharge. Patient comes from home with spouse and son.  Patient expressed understanding of PT recommendation and is agreeable to SNF placement at time of discharge. Patient gave CSW permission to fax out initial referral near [place], [place], and [place] area.  Patient has received the COVID vaccines as well as booster. No further questions reported at this time. CSW to continue to follow and assist with discharge planning needs.   Expected Discharge Plan: Skilled Nursing Facility Barriers to Discharge: Continued Medical Work up   Patient Goals and CMS Choice Patient states their goals for this hospitalization and ongoing recovery are:: to go to SNF CMS Medicare.gov Compare Post Acute Care list provided to:: Patient Choice offered to / list presented to : Patient  Expected Discharge Plan and Services Expected Discharge Plan: [place] In-house Referral: Clinical Social Work     Living arrangements for the past 2 months: [place]                                      Prior Living Arrangements/Services Living arrangements for the past 2 months: Single Family Home Lives with:: Self,Spouse,Adult Children (lives with spouse and son) Patient language and need for interpreter reviewed:: Yes Do you feel safe going back to the place where you live?: No   SNF  Need for Family Participation in Patient Care: Yes (Comment) Care giver support system in place?: Yes (comment)   Criminal Activity/Legal Involvement Pertinent to  Current Situation/Hospitalization: No - Comment as needed  Activities of Daily Living Home Assistive Devices/Equipment: Gilford Rile (specify type) ADL Screening (condition at time of admission) Patient's cognitive ability adequate to safely complete daily activities?: Yes Is the patient deaf or have difficulty hearing?: No Does the patient have difficulty seeing, even when wearing glasses/contacts?: No Does the patient have difficulty concentrating, remembering, or making decisions?: No Patient able to express need for assistance with ADLs?: Yes Does the patient have difficulty dressing or bathing?: Yes Independently performs ADLs?: No Communication: Independent Dressing (OT): Needs assistance Is this a change from baseline?: Pre-admission baseline Grooming: Independent Feeding: Independent Bathing: Needs assistance Is this a change from baseline?: Pre-admission baseline Toileting: Needs assistance Is this a change from baseline?: Pre-admission baseline In/Out Bed: Needs assistance Is this a change from baseline?: Pre-admission baseline Walks in Home: Needs assistance Is this a change from baseline?: Pre-admission baseline Does the patient have difficulty walking or climbing stairs?: Yes Weakness of Legs: Both Weakness of Arms/Hands: Both  Permission Sought/Granted Permission sought to share information with : Case Manager,Family Chief Financial Officer       Permission granted to share info w AGENCY: SNF        Emotional Assessment   Attitude/Demeanor/Rapport: Gracious Affect (typically observed): Calm Orientation: : Oriented to Self,Oriented to Place,Oriented to  Time,Oriented to Situation Alcohol / Substance Use: Not Applicable Psych Involvement: No (comment)  Admission diagnosis:  Acute respiratory failure with  hypoxia ([place]) [J96.01] Acute on chronic respiratory failure with hypoxia ([facility]) [J96.21] Acute on chronic respiratory failure with hypoxemia ([place])  [J96.21] Patient Active Problem List   Diagnosis Date Noted  . Acute respiratory failure with hypoxia ([place]) 02/15/2021  . CKD stage 3 secondary to diabetes ([place]) 02/15/2021  . Obesity hypoventilation syndrome ([place]) 02/15/2021  . Acute on chronic respiratory failure with hypoxemia ([place]) 02/15/2021  . Respiratory failure ([place]) 02/12/2021  . Acute on chronic respiratory failure with hypoxia and hypercapnia ([place]) 02/12/2021  . Abnormal albumin 06/15/2020  . Proteins serum plasma low 06/02/2020  . Injury of toe on right foot 05/20/2020  . Chronic respiratory failure with hypoxia ([place]) 02/26/2020  . Coagulation defect ([place]) 12/07/2019  . Malignant neoplasm of prostate ([place]) 05/15/2019  . Pain due to onychomycosis of toenails of both feet 03/13/2019  . PVD (peripheral vascular disease) ([place]) 03/13/2019  . Prostate cancer ([place]) 02/13/2019  . Urinary tract infection 12/16/2018  . Persistent atrial fibrillation ([place]) 10/17/2018  . Primary osteoarthritis of left shoulder 08/23/2018  . Renal insufficiency 06/29/2018  . Bradycardia 04/26/2018  . Other fatigue 12/06/2017  . Shortness of breath on exertion 12/06/2017  . Essential hypertension 12/06/2017  . Hyperglycemia 07/28/2017  . Neck pain on left side 01/25/2017  . Low back pain 01/25/2017  . Constipation 08/15/2015  . Severe aortic valve stenosis   . Medicare annual wellness visit, subsequent 03/09/2015  . Preventative health care 03/09/2015  . Allergic rhinitis 12/02/2014  . COPD mixed type ([place]) 12/02/2014  . Chronic venous insufficiency 10/02/2013  . Venous stasis dermatitis 10/02/2013  . AVM (arteriovenous malformation) of colon with hemorrhage 02/10/2013  . Thrombocytopenia, unspecified ([place]) 02/09/2013  . EDEMA 04/15/2010  . HYPERCHOLESTEROLEMIA 04/14/2010  . Gout 04/14/2010  . Macrocytic anemia 04/14/2010  . Aortic valve disorder 04/14/2010  . VALVULAR HEART DISEASE 04/14/2010  . Osteoarthritis 04/14/2010  . Multilevel  degenerative disc disease 04/14/2010  . BURSITIS 04/14/2010  . Obesity, Class III, BMI 40-49.9 (morbid obesity) ([place]) 03/21/2010  . Obstructive sleep apnea 02/19/2008   PCP:  [name], MD Pharmacy:   [id number] - [place], [place] - [name] [place] [address] Phone: [phone] Fax: [phone]     Social Determinants of Health (SDOH) Interventions    Readmission Risk Interventions No flowsheet data found.

## 2021-02-15 NOTE — Progress Notes (Addendum)
PROGRESS NOTE    [name]  QVZ:[id number] DOB: 1941/05/17 DOA: 02/14/2021 PCP: [name], MD    Brief Narrative:  [name] is a 80 year old male with past medical history significant for paroxysmal atrial fibrillation, aortic stenosis s/p TAVR, chronic diastolic congestive heart failure, degenerative disc disease, gout, hyperlipidemia, hypertension, morbid obesity, OSA/hypoventilation syndrome, OA, CKD3a, peripheral neuropathy, CAD, history of prostate cancer, venous stasis dermatitis who presented to [facility] ED on 5/28 via EMS for weakness and dyspnea.  Patient reports was unable to get off the toilet with associated shortness of breath and generalized weakness.  Patient denies headache, no fever/chills, no nausea/vomiting/diarrhea, no palpitations, no diaphoresis, no abdominal pain.  Patient recently hospitalized at [facility] long hospital from  5/26 through 5/28 for decompensated OSA/OHS.  During that hospitalization, his BiPAP settings were optimized with a new facemask.  Patient also reports during the hospitalization he had very little mobility which he believes caused deconditioning when he returned home.  In the ED, temperature 99.2 F, HR 89, RR 29, BP 92/52, SPO2 97% on 3 L nasal cannula.  Sodium 137, potassium 3.9, chloride 94, CO2 36, glucose 127, BUN 32, creatinine 1.67.  WBC 10.6 (was 6.6 on 5/28 at 0127am), hemoglobin 12.2, platelets 131.  COVID-19 PCR negative.  Influenza A/B PCR negative.  Urinalysis with large leukocytes, negative nitrite, rare bacteria, greater than 50 WBCs.  VBG with pH 7.49, PaCO2 47.5, PaO2 56.  Chest x-ray with slight increase in right basilar airspace opacity compared to prior.  Blood cultures x2 ordered.  Patient started on cefepime and vancomycin in the ED for right lower lobe pneumonia.  TRH consulted for further evaluation and management of pneumonia and progressive weakness.    Assessment & Plan:   Principal Problem:   Acute  respiratory failure with hypoxia (HCC) Active Problems:   HYPERCHOLESTEROLEMIA   Obesity, Class III, BMI 40-49.9 (morbid obesity) (HCC)   Macrocytic anemia   Obstructive sleep apnea   COPD mixed type (HCC)   Essential hypertension   CKD stage 3 secondary to diabetes ([place])   Obesity hypoventilation syndrome (HCC)   Acute on chronic hypoxic respiratory failure, POA Community-acquired pneumonia Patient presenting to [facility] ED with complaints of productive cough with yellow sputum.  Chest x-ray shows progressive right basilar airspace opacity.  WBC elevated 10.6, up from 6.6 on discharge earlier in the day.  Lactic acid 1.4, procalcitonin elevated 0.44. --MRSA PCR negative, will discontinue vancomycin --Continue cefepime --Continue supplemental oxygen, maintain SPO2 greater than 88%, currently on 3L [place] (baseline requirement is 3L qHS w/ CPAP) --Continue to trend procalcitonin --CBC daily  Acute renal failure on CKD stage IIIa Baseline creatinine 1.1.  On discharge on 02/14/2021 was 1.17 and presented with a creatinine of 1.67.  Etiology likely secondary to prerenal azotemia in the setting of furosemide use versus ATN with borderline hypotension. --Holding home furosemide --Avoid nephrotoxins, renally dose all medications --BMP daily  COPD, not in acute exacerbation Not oxygen dependent during the daytime at home. --continue home Trelegy Ellipta 1 puff daily --Continue supplemental oxygen as above, may need continuous home oxygen on discharge, will assess closer to time of discharge  Obstructive sleep apnea Obesity hypoventilation syndrome Recently discharged by pulmonary critical care medicine on 5/28 with adjustments in his home CPAP settings.  Also fitted with a new mask. --Continue nocturnal CPAP with 3 L O2 bleed in  Chronic diastolic congestive heart failure, compensated On furosemide 20 mg p.o. twice daily.  Borderline hypotensive  and notable for renal insufficiency on  admission.  Chest x-ray with no pulmonary edema. --Holding home furosemide. --Strict I's and O's Daily weights  Dyslipidemia: Continue atorvastatin 80 mg p.o. daily  Peripheral neuropathy --Gabapentin 300 mg p.o. twice daily  History of gout: Allopurinol milligrams p.o. daily  Morbid obesity Body mass index is 48.97 kg/m.  Discussed with patient needs for aggressive lifestyle changes/weight loss as this complicates all facets of care.  Outpatient follow-up with PCP.  May benefit from bariatric evaluation outpatient.  Weakness/deconditioning/debility: Patient reports significant deconditioning since recent hospitalization.  Utilizes a walker at baseline.  Patient not interested in SNF but would be amenable to home health. --PT/OT evaluation   DVT prophylaxis:  apixaban (ELIQUIS) tablet 5 mg    Code Status: Full Code Family Communication: Updated patient's spouse and son present at bedside this morning  Disposition Plan:  Level of care: Progressive Status is: Observation  The patient will require care spanning > 2 midnights and should be moved to inpatient because: Ongoing diagnostic testing needed not appropriate for outpatient work up, Unsafe d/c plan and Inpatient level of care appropriate due to severity of illness  Dispo: The patient is from: Home              Anticipated d/c is to: Home with home health likely              Patient currently is not medically stable to d/c.   Difficult to place patient No   Consultants:   none  Procedures:   None  Antimicrobials:   Vancomycin  Cefepime   Subjective: Patient seen examined bedside, resting comfortably.  On CPAP.  Spouse and son present.  Complains of overt weakness and deconditioning from recent hospitalization at [facility] long.  Reports discharged yesterday and unable to stand up from his toilet.  Also complaining of productive cough with yellow sputum.  No other specific complaints or concerns at this time.   Denies fever, no chills/night sweats, no headache, no visual changes, no chest pain, no palpitations, no abdominal pain, no nausea/vomiting/diarrhea, no congestion, no paresthesias.  No acute events overnight per nursing staff.  Objective: Vitals:   02/15/21 0612 02/15/21 0748 02/15/21 0811 02/15/21 1010  BP: (!) 110/49 112/63 (!) 106/54 112/67  Pulse: 64 61 60 67  Resp: 20 20 (!) 22 17  Temp:  98 F (36.7 C)    TempSrc:  Axillary    SpO2: 96% 95% 94% 96%  Weight:      Height:        Intake/Output Summary (Last 24 hours) at 02/15/2021 1238 Last data filed at 02/15/2021 0610 Gross per 24 hour  Intake 571.81 ml  Output 175 ml  Net 396.81 ml   Filed Weights   02/14/21 1927 02/15/21 0500  Weight: (!) 171.5 kg (!) 173 kg    Examination:  General exam: Appears calm and comfortable, obese on CPAP Respiratory system: Breath sounds slightly decreased bilateral bases, normal respiratory effort, on 3 L nasal cannula room (baseline on 3L qHS only) Cardiovascular system: S1 & S2 heard, RRR. No JVD, murmurs, rubs, gallops or clicks.  Chronic venous changes bilateral lower extremities with 2+ pitting edema to mid shin Gastrointestinal system: Abdomen is nondistended, soft and nontender. No organomegaly or masses felt. Normal bowel sounds heard. Central nervous system: Alert and oriented. No focal neurological deficits. Extremities: Symmetric 5 x 5 power. Skin: Chronic venous changes bilateral lower extremities with cracking of skin Psychiatry: Judgement and insight appear normal.  Mood & affect appropriate.       Data Reviewed: I have personally reviewed following labs and imaging studies  CBC: Recent Labs  Lab 02/12/21 1220 02/12/21 1300 02/13/21 0255 02/14/21 0127 02/14/21 1935 02/14/21 2120 02/15/21 0858  WBC 8.3  --  5.5 6.6 10.6*  --  7.7  NEUTROABS 6.1  --   --   --   --   --   --   HGB 12.1*   < > 11.6* 11.5* 12.2* 12.6* 11.3*  HCT 39.2   < > 37.5* 36.7* 38.4* 37.0*  36.0*  MCV 107.4*  --  110.0* 106.7* 106.4*  --  106.5*  PLT 148*  --  108* 111* 131*  --  100*   < > = values in this interval not displayed.   Basic Metabolic Panel: Recent Labs  Lab 02/12/21 1220 02/12/21 1300 02/13/21 0255 02/14/21 0127 02/14/21 1935 02/14/21 2120 02/15/21 0858  NA 142   < > 143 140 137 137 138  K 4.2   < > 4.4 3.8 3.9 4.0 4.0  CL 98  --  99 97* 94*  --  98  CO2 36*  --  36* 37* 36*  --  33*  GLUCOSE 107*  --  88 103* 127*  --  94  BUN 41*  --  34* 31* 32*  --  35*  CREATININE 1.49*  --  1.31* 1.17 1.67*  --  1.65*  CALCIUM 10.0  --  9.2 9.0 8.9  --  8.5*  MG  --   --  2.0  --   --   --   --   PHOS  --   --  4.2  --   --   --   --    < > = values in this interval not displayed.   GFR: Estimated Creatinine Clearance: 61.8 mL/min (A) (by C-G formula based on SCr of 1.65 mg/dL (H)). Liver Function Tests: Recent Labs  Lab 02/12/21 1220 02/15/21 0858  AST 23 27  ALT 21 19  ALKPHOS 65 48  BILITOT 0.7 1.3*  PROT 6.3* 5.3*  ALBUMIN 4.0 2.8*   No results for input(s): LIPASE, AMYLASE in the last 168 hours. No results for input(s): AMMONIA in the last 168 hours. Coagulation Profile: Recent Labs  Lab 02/12/21 1220  INR 1.2   Cardiac Enzymes: No results for input(s): CKTOTAL, CKMB, CKMBINDEX, TROPONINI in the last 168 hours. BNP (last 3 results) No results for input(s): PROBNP in the last 8760 hours. HbA1C: No results for input(s): HGBA1C in the last 72 hours. CBG: No results for input(s): GLUCAP in the last 168 hours. Lipid Profile: No results for input(s): CHOL, HDL, LDLCALC, TRIG, CHOLHDL, LDLDIRECT in the last 72 hours. Thyroid Function Tests: No results for input(s): TSH, T4TOTAL, FREET4, T3FREE, THYROIDAB in the last 72 hours. Anemia Panel: No results for input(s): VITAMINB12, FOLATE, FERRITIN, TIBC, IRON, RETICCTPCT in the last 72 hours. Sepsis Labs: Recent Labs  Lab 02/12/21 1220 02/14/21 1942 02/15/21 0858  PROCALCITON  --   --   0.44  LATICACIDVEN 1.8 1.6  --     Recent Results (from the past 240 hour(s))  Blood culture (routine single)     Status: None (Preliminary result)   Collection Time: 02/12/21 12:44 PM   Specimen: Left Antecubital; Blood  Result Value Ref Range Status   Specimen Description   Final    LEFT ANTECUBITAL Performed at [facility], [address], [place],  [place] [address]    Special Requests   Final    Blood Culture adequate volume Performed at [facility], [address] [place], [place] [address]    Culture   Final    NO GROWTH 3 DAYS Performed at [facility], [place] [address], [place], [place] [address]    Report Status PENDING  Incomplete  Urine culture     Status: None (Preliminary result)   Collection Time: 02/12/21 12:44 PM   Specimen: In/Out Cath Urine  Result Value Ref Range Status   Specimen Description   Final    IN/OUT CATH URINE Performed at [facility], [address], [place], [place] [address]    Special Requests   Final    NONE Performed at [facility], [address] [place], [place] [address]    Culture   Final    CULTURE REINCUBATED FOR BETTER GROWTH Performed at [facility], [place] [address], [place], [place] [address]    Report Status PENDING  Incomplete  Resp Panel by RT-PCR (Flu A&B, Covid)     Status: None   Collection Time: 02/12/21 12:54 PM   Specimen: Nasopharyngeal(NP) swabs in vial transport medium  Result Value Ref Range Status   SARS Coronavirus 2 by RT PCR NEGATIVE NEGATIVE Final    Comment: (NOTE) SARS-CoV-2 target nucleic acids are NOT DETECTED.  The SARS-CoV-2 RNA is generally detectable in upper respiratory specimens during the acute phase of infection. The lowest concentration of SARS-CoV-2 viral copies this assay can detect is 138 copies/mL. A negative result does not preclude SARS-Cov-2 infection and should not be used as the sole  basis for treatment or other patient management decisions. A negative result may occur with  improper specimen collection/handling, submission of specimen other than nasopharyngeal swab, presence of viral mutation(s) within the areas targeted by this assay, and inadequate number of viral copies(<138 copies/mL). A negative result must be combined with clinical observations, patient history, and epidemiological information. The expected result is Negative.  Fact Sheet for Patients:  EntrepreneurPulse.com.au  Fact Sheet for Healthcare Providers:  IncredibleEmployment.be  This test is no t yet approved or cleared by the [place] FDA and  has been authorized for detection and/or diagnosis of SARS-CoV-2 by FDA under an Emergency Use Authorization (EUA). This EUA will remain  in effect (meaning this test can be used) for the duration of the COVID-19 declaration under Section 564(b)(1) of the Act, 21 U.S.C.section 360bbb-3(b)(1), unless the authorization is terminated  or revoked sooner.       Influenza A by PCR NEGATIVE NEGATIVE Final   Influenza B by PCR NEGATIVE NEGATIVE Final    Comment: (NOTE) The Xpert Xpress SARS-CoV-2/FLU/RSV plus assay is intended as an aid in the diagnosis of influenza from Nasopharyngeal swab specimens and should not be used as a sole basis for treatment. Nasal washings and aspirates are unacceptable for Xpert Xpress SARS-CoV-2/FLU/RSV testing.  Fact Sheet for Patients: EntrepreneurPulse.com.au  Fact Sheet for Healthcare Providers: IncredibleEmployment.be  This test is not yet approved or cleared by the [place] FDA and has been authorized for detection and/or diagnosis of SARS-CoV-2 by FDA under an Emergency Use Authorization (EUA). This EUA will remain in effect (meaning this test can be used) for the duration of the COVID-19 declaration under Section 564(b)(1) of the Act,  21 U.S.C. section 360bbb-3(b)(1), unless the authorization is terminated or revoked.  Performed at [facility], [address], [place],  [address]  MRSA PCR Screening     Status: None   Collection Time: 02/12/21  5:55 PM   Specimen: Nasopharyngeal  Result Value Ref Range Status   MRSA by PCR NEGATIVE NEGATIVE Final    Comment:        The GeneXpert MRSA Assay (FDA approved for NASAL specimens only), is one component of a comprehensive MRSA colonization surveillance program. It is not intended to diagnose MRSA infection nor to guide or monitor treatment for MRSA infections. Performed at [facility], [place] [address] [place], [place] [address]   Resp Panel by RT-PCR (Flu A&B, Covid) Nasopharyngeal Swab     Status: None   Collection Time: 02/14/21  7:40 PM   Specimen: Nasopharyngeal Swab; Nasopharyngeal(NP) swabs in vial transport medium  Result Value Ref Range Status   SARS Coronavirus 2 by RT PCR NEGATIVE NEGATIVE Final    Comment: (NOTE) SARS-CoV-2 target nucleic acids are NOT DETECTED.  The SARS-CoV-2 RNA is generally detectable in upper respiratory specimens during the acute phase of infection. The lowest concentration of SARS-CoV-2 viral copies this assay can detect is 138 copies/mL. A negative result does not preclude SARS-Cov-2 infection and should not be used as the sole basis for treatment or other patient management decisions. A negative result may occur with  improper specimen collection/handling, submission of specimen other than nasopharyngeal swab, presence of viral mutation(s) within the areas targeted by this assay, and inadequate number of viral copies(<138 copies/mL). A negative result must be combined with clinical observations, patient history, and epidemiological information. The expected result is Negative.  Fact Sheet for Patients:  EntrepreneurPulse.com.au  Fact Sheet for Healthcare  Providers:  IncredibleEmployment.be  This test is no t yet approved or cleared by the [place] FDA and  has been authorized for detection and/or diagnosis of SARS-CoV-2 by FDA under an Emergency Use Authorization (EUA). This EUA will remain  in effect (meaning this test can be used) for the duration of the COVID-19 declaration under Section 564(b)(1) of the Act, 21 U.S.C.section 360bbb-3(b)(1), unless the authorization is terminated  or revoked sooner.       Influenza A by PCR NEGATIVE NEGATIVE Final   Influenza B by PCR NEGATIVE NEGATIVE Final    Comment: (NOTE) The Xpert Xpress SARS-CoV-2/FLU/RSV plus assay is intended as an aid in the diagnosis of influenza from Nasopharyngeal swab specimens and should not be used as a sole basis for treatment. Nasal washings and aspirates are unacceptable for Xpert Xpress SARS-CoV-2/FLU/RSV testing.  Fact Sheet for Patients: EntrepreneurPulse.com.au  Fact Sheet for Healthcare Providers: IncredibleEmployment.be  This test is not yet approved or cleared by the [place] FDA and has been authorized for detection and/or diagnosis of SARS-CoV-2 by FDA under an Emergency Use Authorization (EUA). This EUA will remain in effect (meaning this test can be used) for the duration of the COVID-19 declaration under Section 564(b)(1) of the Act, 21 U.S.C. section 360bbb-3(b)(1), unless the authorization is terminated or revoked.  Performed at [facility], [place] [address], [place], [place] [address]   Blood culture (routine x 2)     Status: None (Preliminary result)   Collection Time: 02/14/21  7:40 PM   Specimen: BLOOD LEFT WRIST  Result Value Ref Range Status   Specimen Description BLOOD LEFT WRIST  Final   Special Requests   Final    BOTTLES DRAWN AEROBIC AND ANAEROBIC Blood Culture adequate volume   Culture   Final    NO GROWTH < 12 HOURS Performed  at [facility],  [place] [address] [place], [place] [address]    Report Status PENDING  Incomplete  MRSA PCR Screening     Status: None   Collection Time: 02/15/21  8:36 AM   Specimen: Nasopharyngeal  Result Value Ref Range Status   MRSA by PCR NEGATIVE NEGATIVE Final    Comment:        The GeneXpert MRSA Assay (FDA approved for NASAL specimens only), is one component of a comprehensive MRSA colonization surveillance program. It is not intended to diagnose MRSA infection nor to guide or monitor treatment for MRSA infections. Performed at [facility], [place] [address], [place], [place] [address]          Radiology Studies: DG Chest Portable 1 View  Result Date: 02/14/2021 CLINICAL DATA:  Weakness and shortness of breath EXAM: PORTABLE CHEST 1 VIEW COMPARISON:  02/12/2021 FINDINGS: Cardiac shadow is enlarged but stable. Pacing device is again seen. Slight increase in the degree of right basilar airspace opacity when compare with the prior study. No other focal abnormality is noted. IMPRESSION: Slight increase in the degree of right basilar airspace opacity when compared with the prior study. Electronically Signed   By: Inez Catalina M.D.   On: 02/14/2021 20:08        Scheduled Meds: . allopurinol  100 mg Oral Daily  . apixaban  5 mg Oral BID  . vitamin C  500 mg Oral Daily  . atorvastatin  80 mg Oral Daily  . famotidine  40 mg Oral Daily  . fluticasone furoate-vilanterol  1 puff Inhalation Daily   And  . umeclidinium bromide  1 puff Inhalation Daily  . gabapentin  300 mg Oral BID  . polyethylene glycol  17 g Oral Daily   Continuous Infusions: . ceFEPime (MAXIPIME) IV 2 g (02/15/21 0825)  . [START ON 02/16/2021] vancomycin       LOS: 0 days    Time spent: 50 minutes spent on chart review, discussion with nursing staff, consultants, updating family and interview/physical exam; more than 50% of that time was spent in counseling and/or coordination of care.    Rosalva Neary J [place]  DO [facility] Available via Epic secure chat 7am-7pm After these hours, please refer to coverage provider listed on [email or web address] 02/15/2021, 12:38 PM

## 2021-02-15 NOTE — ED Provider Notes (Signed)
[facility] EMERGENCY DEPARTMENT Provider Note   CSN: [id number] Arrival date & time: 02/14/21  1913     History Chief Complaint  Patient presents with  . Weakness  . Shortness of Breath    [name] is a 80 y.o. male.  80 year old male with extensive past medical history below including OSA, morbid obesity, atrial fibrillation, CHF who presents with weakness and shortness of breath.  Patient was just discharged from the hospital today after an ICU stay for acute on chronic respiratory failure.  He states that he went home on room air with oxygen through BiPAP at night.  EMS was called out to his house because his wife had a fall and while EMS was there, patient was unable to get up off the toilet and began having weakness and shortness of breath.  EMS recommended that he come back for evaluation.  He was noted to be 78% on room air prior to arrival.  He feels okay and states that his breathing is about the same as time of discharge.  He denies any chest pain or other complaints.  His leg edema is stable.  The history is provided by the patient.  Weakness Associated symptoms: shortness of breath   Shortness of Breath      Past Medical History:  Diagnosis Date  . 1st degree AV block 11/09/2018   Noted on EKG   . Anemia   . Aortic stenosis, severe    S/p Edwards Sapien 3 Transcatheter Heart Valve (size 26 mm, model # [id number], serial # [id number])  . Arthritis   . Back pain   . Bursitis   . Cataract    left immature  . Cellulitis 10/12/2015  . CHF (congestive heart failure) ([place])   . Complication of anesthesia    Halucinations  . Constipation    takes Miralax daily as well as Senokot daily  . DDD (degenerative disc disease)   . Gout    takes Allopurinol daily  . Heart valve disorder   . History of blood clots 1962   knee  . History of blood transfusion    no abnormal reaction noted  . History of shingles   . Hyperlipidemia    takes  Atorvastatin daily  . Hypertension    takes Lisinopril daily  . Low back pain 01/25/2017  . Morbid obesity ([place])   . OSA (obstructive sleep apnea)   . Osteoarthritis   . Peripheral neuropathy    takes Gabapentin daily  . Peroneal palsy    significant right foot drop  . Persistent atrial fibrillation ([place])   . Pneumonia 25+yrs ago   hx of  . Prostate cancer ([place]) 02/13/2019  . RBBB 11/09/2018   Noted on EKG  . Thrombocytopenia ([place])   . Urinary frequency   . Urinary urgency   . Valvular heart disease   . Venous stasis dermatitis     Patient Active Problem List   Diagnosis Date Noted  . Respiratory failure ([place]) 02/12/2021  . Acute on chronic respiratory failure with hypoxia and hypercapnia ([place]) 02/12/2021  . Abnormal albumin 06/15/2020  . Proteins serum plasma low 06/02/2020  . Injury of toe on right foot 05/20/2020  . Chronic respiratory failure with hypoxia ([place]) 02/26/2020  . Coagulation defect ([place]) 12/07/2019  . Malignant neoplasm of prostate ([place]) 05/15/2019  . Pain due to onychomycosis of toenails of both feet 03/13/2019  . PVD (peripheral vascular disease) ([place]) 03/13/2019  . Prostate cancer ([place]) 02/13/2019  .  Urinary tract infection 12/16/2018  . Persistent atrial fibrillation ([place]) 10/17/2018  . Primary osteoarthritis of left shoulder 08/23/2018  . Renal insufficiency 06/29/2018  . Bradycardia 04/26/2018  . Other fatigue 12/06/2017  . Shortness of breath on exertion 12/06/2017  . Essential hypertension 12/06/2017  . Hyperglycemia 07/28/2017  . Neck pain on left side 01/25/2017  . Low back pain 01/25/2017  . Constipation 08/15/2015  . Severe aortic valve stenosis   . Medicare annual wellness visit, subsequent 03/09/2015  . Preventative health care 03/09/2015  . Allergic rhinitis 12/02/2014  . COPD mixed type ([place]) 12/02/2014  . Chronic venous insufficiency 10/02/2013  . Venous stasis dermatitis 10/02/2013  . AVM (arteriovenous malformation) of colon with  hemorrhage 02/10/2013  . Thrombocytopenia, unspecified ([place]) 02/09/2013  . EDEMA 04/15/2010  . HYPERCHOLESTEROLEMIA 04/14/2010  . Gout 04/14/2010  . Anemia 04/14/2010  . Aortic valve disorder 04/14/2010  . VALVULAR HEART DISEASE 04/14/2010  . Osteoarthritis 04/14/2010  . Multilevel degenerative disc disease 04/14/2010  . BURSITIS 04/14/2010  . OBESITY, MORBID 03/21/2010  . Obstructive sleep apnea 02/19/2008    Past Surgical History:  Procedure Laterality Date  . CARDIAC CATHETERIZATION N/A 05/02/2015   Procedure: Right/Left Heart Cath and Coronary Angiography;  Surgeon: Burnell Blanks, MD;  Location: [place] CV LAB;  Service: Cardiovascular;  Laterality: N/A;  . CARDIOVERSION N/A 10/27/2018   Procedure: CARDIOVERSION;  Surgeon: Fay Records, MD;  Location: [facility] ENDOSCOPY;  Service: Cardiovascular;  Laterality: N/A;  . COLONOSCOPY N/A 02/07/2013   Procedure: COLONOSCOPY;  Surgeon: Juanita Craver, MD;  Location: [facility] ENDOSCOPY;  Service: Endoscopy;  Laterality: N/A;  . COLONOSCOPY N/A 02/09/2013   Procedure: COLONOSCOPY;  Surgeon: Beryle Beams, MD;  Location: [place];  Service: Endoscopy;  Laterality: N/A;  . ESOPHAGOGASTRODUODENOSCOPY N/A 02/07/2013   Procedure: ESOPHAGOGASTRODUODENOSCOPY (EGD);  Surgeon: Juanita Craver, MD;  Location: [facility] ENDOSCOPY;  Service: Endoscopy;  Laterality: N/A;  . GIVENS CAPSULE STUDY N/A 02/09/2013   Procedure: GIVENS CAPSULE STUDY;  Surgeon: Beryle Beams, MD;  Location: [place];  Service: Endoscopy;  Laterality: N/A;  . HERNIA REPAIR     umbilical hernia  . KNEE SURGERY Right    multiple knee surgeries due to complication of R TKA  . LAMINOTOMY  5830   c6-t2  . PACEMAKER IMPLANT N/A 04/27/2018   Procedure: PACEMAKER IMPLANT;  Surgeon: Constance Haw, MD;  Location: [place] CV LAB;  Service: Cardiovascular;  Laterality: N/A;  . PROSTATE BIOPSY N/A 04/12/2019   Procedure: BIOPSY TRANSRECTAL ULTRASONIC PROSTATE (TUBP) CYSTOSCOPY;   Surgeon: [name] Hughs, MD;  Location: [facility] ORS;  Service: Urology;  Laterality: N/A;  . SPINE SURGERY    . TEE WITHOUT CARDIOVERSION N/A 03/07/2015   Procedure: TRANSESOPHAGEAL ECHOCARDIOGRAM (TEE);  Surgeon: Josue Hector, MD;  Location: [facility] ENDOSCOPY;  Service: Cardiovascular;  Laterality: N/A;  ANES TO BRING PROPOFOL PER DOCTOR  . TEE WITHOUT CARDIOVERSION N/A 06/10/2015   Procedure: TRANSESOPHAGEAL ECHOCARDIOGRAM (TEE);  Surgeon: Burnell Blanks, MD;  Location: [place];  Service: Open Heart Surgery;  Laterality: N/A;  . TOTAL KNEE ARTHROPLASTY     right   x4 left x 1   . TRANSCATHETER AORTIC VALVE REPLACEMENT, TRANSFEMORAL Left 06/10/2015   Procedure: TRANSCATHETER AORTIC VALVE REPLACEMENT, TRANSFEMORAL;  Surgeon: Burnell Blanks, MD;  Location: [place];  Service: Open Heart Surgery;  Laterality: Left;       Family History  Problem Relation Age of Onset  . Other Father  POSSIBLE HEART ATTACK  . Arthritis Sister        "crippling"  . Prostate cancer Brother   . Arthritis Sister   . Breast cancer Neg Hx   . Colon cancer Neg Hx     Social History   Tobacco Use  . Smoking status: Former Smoker    Years: 30.00    Types: Pipe    Quit date: 09/20/2006    Years since quitting: 14.4  . Smokeless tobacco: Never Used  Vaping Use  . Vaping Use: Never used  Substance Use Topics  . Alcohol use: No    Alcohol/week: 0.0 standard drinks  . Drug use: No    Home Medications Prior to Admission medications   Medication Sig Start Date End Date Taking? Authorizing Provider  acetaminophen (TYLENOL) 500 MG tablet Take 2,000 mg by mouth 2 (two) times daily.    [provider]  albuterol (VENTOLIN HFA) 108 (90 Base) MCG/ACT inhaler TAKE 2 PUFFS BY MOUTH EVERY 6 HOURS AS NEEDED Patient taking differently: Inhale 2 puffs into the lungs every 6 (six) hours as needed for wheezing or shortness of breath. 12/25/20   Midge Minium, MD  allopurinol (ZYLOPRIM) 100 MG  tablet Take 1 tablet (100 mg total) by mouth daily. 08/19/20   [name], MD  atorvastatin (LIPITOR) 80 MG tablet 1 tablet by mouth daily Patient taking differently: Take 80 mg by mouth daily. 10/22/20   [name], MD  cetirizine (ZYRTEC) 10 MG tablet Take 1 tablet (10 mg total) by mouth daily. Patient not taking: Reported on 02/13/2021 12/25/20   Midge Minium, MD  Cholecalciferol (VITAMIN D3) 125 MCG (5000 UT) CAPS Take 1,000 Units by mouth daily.    [provider]  Coenzyme Q10 (COQ10) 100 MG CAPS Take 100 mg by mouth daily.    [provider]  ELIQUIS 5 MG TABS tablet TAKE 1 TABLET BY MOUTH TWICE A DAY Patient taking differently: Take 5 mg by mouth 2 (two) times daily. 07/28/20   Josue Hector, MD  famotidine (PEPCID) 40 MG tablet TAKE 1 TABLET BY MOUTH EVERY DAY Patient taking differently: Take 40 mg by mouth daily. 08/11/20   [name], MD  Ferrous Fumarate-Folic Acid (HEMATINIC/FOLIC ACID) 027-2 MG TABS Take 1 tablet by mouth daily. Patient not taking: Reported on 02/13/2021 07/08/20   [name], MD  Fluticasone-Umeclidin-Vilant (TRELEGY ELLIPTA) 100-62.5-25 MCG/INH AEPB Inhale 1 puff into the lungs daily. 05/30/20   Deneise Lever, MD  furosemide (LASIX) 20 MG tablet Take 1 tablet (20 mg total) by mouth 2 (two) times daily. 09/25/20   Josue Hector, MD  gabapentin (NEURONTIN) 300 MG capsule Take 1 capsule (300 mg total) by mouth 3 (three) times daily. Patient taking differently: Take 300-600 mg by mouth 2 (two) times daily. 09/25/20   [name], MD  Misc Natural Products (TART CHERRY ADVANCED PO) Take 1,200 mg by mouth 2 (two) times a day.     [provider]  Multiple Vitamins-Minerals (CENTRUM SILVER PO) Take 1 tablet by mouth daily.    [provider]  Phenazopyridine HCl (AZO-STANDARD PO) Take 1 tablet by mouth 2 (two) times daily.    [provider]  polyethylene glycol (MIRALAX / GLYCOLAX) packet Take 17 g  by mouth daily.    [provider]  vitamin C (ASCORBIC ACID) 500 MG tablet Take 500 mg by mouth daily.    [provider]    Allergies  Sulfa antibiotics and Coumadin [warfarin sodium]  Review of Systems   Review of Systems  Respiratory: Positive for shortness of breath.   Neurological: Positive for weakness.   All other systems reviewed and are negative except that which was mentioned in HPI  Physical Exam Updated Vital Signs BP (!) 85/41   Pulse 65   Temp 99.2 F (37.3 C) (Oral)   Resp (!) 24   Ht 6\' 2"  (1.88 m)   Wt (!) 171.5 kg   SpO2 (!) 74%   BMI 48.53 kg/m   Physical Exam Vitals and nursing note reviewed.  Constitutional:      General: He is not in acute distress.    Appearance: Normal appearance. He is obese.  HENT:     Head: Normocephalic and atraumatic.  Eyes:     Conjunctiva/sclera: Conjunctivae normal.  Cardiovascular:     Rate and Rhythm: Normal rate and regular rhythm.     Heart sounds: Normal heart sounds. No murmur heard.   Pulmonary:     Effort: Pulmonary effort is normal.     Comments: Difficult to auscultate lung sounds due to body habitus Abdominal:     General: Abdomen is flat. Bowel sounds are normal. There is no distension.     Palpations: Abdomen is soft.     Tenderness: There is no abdominal tenderness.  Musculoskeletal:     Right lower leg: Edema present.     Left lower leg: Edema present.  Skin:    General: Skin is warm and dry.     Comments: Chronic venous stasis dermatitis bilateral lower extremities  Neurological:     Mental Status: He is alert and oriented to person, place, and time.     Comments: fluent  Psychiatric:        Mood and Affect: Mood normal.        Behavior: Behavior normal.     ED Results / Procedures / Treatments   Labs (all labs ordered are listed, but only abnormal results are displayed) Labs Reviewed  BASIC METABOLIC PANEL - Abnormal; Notable for the following components:       Result Value   Chloride 94 (*)    CO2 36 (*)    Glucose, Bld 127 (*)    BUN 32 (*)    Creatinine, Ser 1.67 (*)    GFR, Estimated 42 (*)    All other components within normal limits  CBC - Abnormal; Notable for the following components:   WBC 10.6 (*)    RBC 3.61 (*)    Hemoglobin 12.2 (*)    HCT 38.4 (*)    MCV 106.4 (*)    RDW 19.0 (*)    Platelets 131 (*)    All other components within normal limits  BRAIN NATRIURETIC PEPTIDE - Abnormal; Notable for the following components:   B Natriuretic Peptide 642.9 (*)    All other components within normal limits  I-STAT VENOUS BLOOD GAS, ED - Abnormal; Notable for the following components:   pH, Ven 7.485 (*)    pO2, Ven 56.0 (*)    Bicarbonate 35.8 (*)    TCO2 37 (*)    Acid-Base Excess 11.0 (*)    Calcium, Ion 0.97 (*)    HCT 37.0 (*)    Hemoglobin 12.6 (*)    All other components within normal limits  RESP PANEL BY RT-PCR (FLU A&B, COVID) ARPGX2  CULTURE, BLOOD (ROUTINE X 2)  CULTURE, BLOOD (ROUTINE X 2)  LACTIC ACID, PLASMA  URINALYSIS,  ROUTINE W REFLEX MICROSCOPIC  LACTIC ACID, PLASMA  CBG MONITORING, ED    EKG EKG Interpretation  Date/Time:  Saturday Feb 14 2021 19:29:34 EDT Ventricular Rate:  95 PR Interval:  65 QRS Duration: 151 QT Interval:  395 QTC Calculation: 486 R Axis:   -65 Text Interpretation: Sinus or ectopic atrial rhythm Multiple ventricular premature complexes Short PR interval Probable left atrial enlargement LVH with IVCD, LAD and secondary repol abnrm Probable inferior infarct, recent No significant change since last tracing Confirmed by Theotis Burrow [phone]) on 02/14/2021 7:39:20 PM   Radiology DG Chest Portable 1 View  Result Date: 02/14/2021 CLINICAL DATA:  Weakness and shortness of breath EXAM: PORTABLE CHEST 1 VIEW COMPARISON:  02/12/2021 FINDINGS: Cardiac shadow is enlarged but stable. Pacing device is again seen. Slight increase in the degree of right basilar airspace opacity when compare  with the prior study. No other focal abnormality is noted. IMPRESSION: Slight increase in the degree of right basilar airspace opacity when compared with the prior study. Electronically Signed   By: Inez Catalina M.D.   On: 02/14/2021 20:08   ECHOCARDIOGRAM COMPLETE  Result Date: 02/13/2021    ECHOCARDIOGRAM REPORT   Patient Name:   [name] Date of Exam: 02/13/2021 Medical Rec #:  [id number]     Height:       74.0 in Accession #:    [id number]    Weight:       389.1 lb Date of Birth:  Apr 15, 1941    BSA:          2.884 m Patient Age:    89 years      BP:           126/58 mmHg Patient Gender: M             HR:           56 bpm. Exam Location:  Inpatient Procedure: 2D Echo, Cardiac Doppler and Color Doppler Indications:    Respiratory distress  History:        Patient has prior history of Echocardiogram examinations, most                 recent 05/09/2020. Risk Factors:Dyslipidemia and Hypertension.  Sonographer:    Cammy Brochure Referring Phys: [id number] [place]  1. Severe calcific MS is present. MG 16 mmHG @ 77 bpm. MVA 1.02 cm2, DI 0.35. The mitral valve is degenerative. No evidence of mitral valve regurgitation. Severe mitral stenosis. Severe mitral annular calcification.  2. Left ventricular ejection fraction, by estimation, is >75%. The left ventricle has hyperdynamic function. The left ventricle has no regional wall motion abnormalities. There is moderate concentric left ventricular hypertrophy. Left ventricular diastolic function could not be evaluated. There is the interventricular septum is flattened in systole and diastole, consistent with right ventricular pressure and volume overload.  3. Right ventricular systolic function is mildly reduced. The right ventricular size is moderately enlarged. There is mildly elevated pulmonary artery systolic pressure. The estimated right ventricular systolic pressure is 78.4 mmHg.  4. Left atrial size was moderately dilated.  5. Right atrial  size was mildly dilated.  6. 26 mm S3. Vmax 2.2 m/s, MG 10 mmHG, EOA 2.0 cm2. No regurgitation. Normal prosthesis . The aortic valve has been repaired/replaced. Aortic valve regurgitation is not visualized. Procedure Date: 2016. Echo findings are consistent with normal structure  and function of the aortic valve prosthesis.  7. The inferior vena cava is normal in size  with greater than 50% respiratory variability, suggesting right atrial pressure of 3 mmHg. Comparison(s): No significant change from prior study. FINDINGS  Left Ventricle: Left ventricular ejection fraction, by estimation, is >75%. The left ventricle has hyperdynamic function. The left ventricle has no regional wall motion abnormalities. The left ventricular internal cavity size was normal in size. There is moderate concentric left ventricular hypertrophy. The interventricular septum is flattened in systole and diastole, consistent with right ventricular pressure and volume overload. Left ventricular diastolic function could not be evaluated due to mitral annular calcification (moderate or greater). Left ventricular diastolic function could not be evaluated. Right Ventricle: The right ventricular size is moderately enlarged. No increase in right ventricular wall thickness. Right ventricular systolic function is mildly reduced. There is mildly elevated pulmonary artery systolic pressure. The tricuspid regurgitant velocity is 2.90 m/s, and with an assumed right atrial pressure of 3 mmHg, the estimated right ventricular systolic pressure is 01.7 mmHg. Left Atrium: Left atrial size was moderately dilated. Right Atrium: Right atrial size was mildly dilated. Pericardium: Trivial pericardial effusion is present. Mitral Valve: Severe calcific MS is present. MG 16 mmHG @ 77 bpm. MVA 1.02 cm2, DI 0.35. The mitral valve is degenerative in appearance. Severe mitral annular calcification. No evidence of mitral valve regurgitation. Severe mitral valve stenosis. MV  peak  gradient, 34.3 mmHg. The mean mitral valve gradient is 16.0 mmHg with average heart rate of 77 bpm. Tricuspid Valve: The tricuspid valve is grossly normal. Tricuspid valve regurgitation is mild . No evidence of tricuspid stenosis. Aortic Valve: 26 mm S3. Vmax 2.2 m/s, MG 10 mmHG, EOA 2.0 cm2. No regurgitation. Normal prosthesis. The aortic valve has been repaired/replaced. Aortic valve regurgitation is not visualized. Aortic valve mean gradient measures 10.0 mmHg. Aortic valve peak gradient measures 19.4 mmHg. Aortic valve area, by VTI measures 2.00 cm. There is a 26 mm Sapien prosthetic, stented (TAVR) valve present in the aortic position. Echo findings are consistent with normal structure and function of the aortic valve prosthesis. Pulmonic Valve: The pulmonic valve was grossly normal. Pulmonic valve regurgitation is not visualized. No evidence of pulmonic stenosis. Aorta: The aortic root is normal in size and structure. Venous: The inferior vena cava is normal in size with greater than 50% respiratory variability, suggesting right atrial pressure of 3 mmHg. IAS/Shunts: The atrial septum is grossly normal. Additional Comments: A device lead is visualized in the right atrium and right ventricle.  LEFT VENTRICLE PLAX 2D LVIDd:         5.40 cm  Diastology LVIDs:         3.40 cm  LV e' lateral: 4.68 cm/s LV PW:         1.50 cm LV IVS:        1.50 cm LVOT diam:     1.90 cm LV SV:         84 LV SV Index:   29 LVOT Area:     2.84 cm  RIGHT VENTRICLE             IVC RV Basal diam:  5.30 cm     IVC diam: 2.10 cm RV S prime:     13.50 cm/s TAPSE (M-mode): 1.9 cm LEFT ATRIUM              Index       RIGHT ATRIUM           Index LA diam:        5.00 cm  1.73 cm/m  RA Area:     26.10 cm LA Vol (A2C):   129.0 ml 44.73 ml/m RA Volume:   86.30 ml  29.92 ml/m LA Vol (A4C):   132.0 ml 45.77 ml/m LA Biplane Vol: 133.0 ml 46.12 ml/m  AORTIC VALVE AV Area (Vmax):    2.23 cm AV Area (Vmean):   1.99 cm AV Area (VTI):      2.00 cm AV Vmax:           220.00 cm/s AV Vmean:          147.000 cm/s AV VTI:            0.420 m AV Peak Grad:      19.4 mmHg AV Mean Grad:      10.0 mmHg LVOT Vmax:         173.00 cm/s LVOT Vmean:        103.000 cm/s LVOT VTI:          0.296 m LVOT/AV VTI ratio: 0.70  AORTA Ao Root diam: 2.60 cm MITRAL VALVE             TRICUSPID VALVE MV Area VTI:  1.02 cm   TR Peak grad:   33.6 mmHg MV Peak grad: 34.3 mmHg  TR Vmax:        290.00 cm/s MV Mean grad: 16.0 mmHg MV Vmax:      2.93 m/s   SHUNTS MV Vmean:     184.0 cm/s Systemic VTI:  0.30 m MV VTI:       0.82 m     Systemic Diam: 1.90 cm Eleonore Chiquito MD Electronically signed by Eleonore Chiquito MD Signature Date/Time: 02/13/2021/3:23:21 PM    Final     Procedures Procedures   Medications Ordered in ED Medications  albuterol (VENTOLIN HFA) 108 (90 Base) MCG/ACT inhaler 2 puff (has no administration in time range)  ceFEPIme (MAXIPIME) 1 g in sodium chloride 0.9 % 100 mL IVPB (has no administration in time range)    ED Course  I have reviewed the triage vital signs and the nursing notes.  Pertinent labs & imaging results that were available during my care of the patient were reviewed by me and considered in my medical decision making (see chart for details).    MDM Rules/Calculators/A&P                          Patient was alert, oriented, and conversant on my exam.  He did have 3 L oxygen requirement and states that he is not usually on oxygen during the daytime.  Lab work shows reassuring VBG, BNP 642, normal lactate, COVID-negative, creatinine 1.67 which is slightly elevated from previous, WBC 10.6, hemoglobin 12.2.  Chest x-ray shows worsening opacity and right lower lobe.  He is on Eliquis making PE unlikely.  Reviewed his hospital course and it sounds like they were more suspicious of OSA and obesity hypoventilation syndrome rather than infectious process.  Concerned about worsening hypoxia combined with infiltrate.  Will cover for HCAP w/  broad antibiotics. Discussed admission w/ Olevia Bowens.  Final Clinical Impression(s) / ED Diagnoses Final diagnoses:  None    Rx / DC Orders ED Discharge Orders    None       Kathyjo Briere, Wenda Overland, MD 02/15/21 [phone]

## 2021-02-15 NOTE — Progress Notes (Signed)
Pharmacy Antibiotic Note  [name] is a 80 y.o. male admitted on 02/14/2021 with pneumonia.  Pharmacy has been consulted for vancomycin dosing.  Plan: Vancomycin 2500mg  x1 then 1750mg  IV Q24H. Goal AUC 400-550.  Expected AUC 510.  SCr used 1.67.  Cefepime ordered appropriately by admitting MD.  Height: 6\' 2"  (188 cm) Weight: (!) 173 kg (381 lb 6.3 oz) IBW/kg (Calculated) : 82.2  Temp (24hrs), Avg:98.3 F (36.8 C), Min:97.6 F (36.4 C), Max:99.2 F (37.3 C)  Recent Labs  Lab 02/12/21 1220 02/13/21 0255 02/14/21 0127 02/14/21 1935 02/14/21 1942  WBC 8.3 5.5 6.6 10.6*  --   CREATININE 1.49* 1.31* 1.17 1.67*  --   LATICACIDVEN 1.8  --   --   --  1.6    Estimated Creatinine Clearance: 61.1 mL/min (A) (by C-G formula based on SCr of 1.67 mg/dL (H)).    Allergies  Allergen Reactions  . Sulfa Antibiotics     Other reaction(s): Unknown  . Coumadin [Warfarin Sodium] Other (See Comments)    States he can't be on this-bleeds out    Thank you for allowing pharmacy to be a part of this patient's care.  Wynona Neat, PharmD, BCPS  02/15/2021 7:09 AM

## 2021-02-15 NOTE — H&P (Addendum)
History and Physical    [name] EHU:[id number] DOB: Sep 24, 1940 DOA: 02/14/2021  PCP: [name], MD   Patient coming from: Home.  I have personally briefly reviewed patient's old medical records in [place]  Chief Complaint: Weakness and shortness of breath.  HPI: [name] is a 80 y.o. male with medical history significant of aortic valve disease status post TAVR, first-degree AV block, chronic diastolic CHF, osteoarthritis, back pain, bursitis, cataract, history of cellulitis, hallucinations, constipation, DDD, gout, history of knee thrombosis, herpes zoster, hyperlipidemia, hypertension, morbid obesity, OSA/hypoventilation syndrome, osteoarthritis, peripheral neuropathy, peroneal [place] CAD, paroxysmal atrial fibrillation, history of pneumonia, prostate cancer, RBBB, thrombocytopenia, urinary frequency, venous stasis dermatitis who was admitted 2 days ago and discharged yesterday morning for acute on chronic respiratory failure by PCCM who was brought in by EMS today after they initially went to his residence because his wife had a fall.  However, while the EMS crew was there he was sitting on the toilet and was unable to get up off the toilet and developing dyspnea and generalized weakness.  EMS brought him back for evaluation.  He denies any new symptoms or other than weakness and dyspnea that have been chronically present.  No headache, sore throat, rhinorrhea, chest pain, palpitations, diaphoresis, abdominal pain, diarrhea, melena or hematochezia.  Denies dysuria, frequency or hematuria.  ED Course: Initial vital signs were temperature 99.2 F, pulse 84, respirations 29, BP 92/56 mmHg O2 sat was initially 78% off oxygen, but then he was in the mid to high 90s on 3 LPM via [place].  He was given cefepime and vancomycin in the emergency department.  Lab work: Urinalysis was cloudy, small hemoglobinuria, proteinuria 100, large leukocyte esterase.  RBC more than 50, WBC  more than 50 and rare bacteria on microscopic examination.  CBC showed a white count of 10.6, hemoglobin 12.2 g/dL and platelets 131.  Sodium 137, potassium 3.9, chloride 94 and CO2 36 minimal/L.  Glucose 127, BUN 32 and creatinine 1.67.  Coronavirus and influenza PCR were negative.  Lactic acid was normal.  His VBG showed a pH of seven-point [place], PCO2 of 47.5 and PO2 of 56.0 mmHg.  Bicarbonate was 35.8 and PCO2 37 mmol/L with an O2 saturation of 90%.  Imaging: A one-view portable chest radiograph shows slight increase in the degree to right basilar airspace opacity when compared to the prior study.  Review of Systems: As per HPI otherwise all other systems reviewed and are negative.  Past Medical History:  Diagnosis Date  . 1st degree AV block 11/09/2018   Noted on EKG   . Anemia   . Aortic stenosis, severe    S/p Edwards Sapien 3 Transcatheter Heart Valve (size 26 mm, model # [id number], serial # [id number])  . Arthritis   . Back pain   . Bursitis   . Cataract    left immature  . Cellulitis 10/12/2015  . CHF (congestive heart failure) ([place])   . Complication of anesthesia    Halucinations  . Constipation    takes Miralax daily as well as Senokot daily  . DDD (degenerative disc disease)   . Gout    takes Allopurinol daily  . Heart valve disorder   . History of blood clots 1962   knee  . History of blood transfusion    no abnormal reaction noted  . History of shingles   . Hyperlipidemia    takes Atorvastatin daily  . Hypertension    takes Lisinopril daily  .  Low back pain 01/25/2017  . Morbid obesity ([place])   . OSA (obstructive sleep apnea)   . Osteoarthritis   . Peripheral neuropathy    takes Gabapentin daily  . Peroneal palsy    significant right foot drop  . Persistent atrial fibrillation ([place])   . Pneumonia 25+yrs ago   hx of  . Prostate cancer ([place]) 02/13/2019  . RBBB 11/09/2018   Noted on EKG  . Thrombocytopenia ([place])   . Urinary frequency   . Urinary urgency   .  Valvular heart disease   . Venous stasis dermatitis     Past Surgical History:  Procedure Laterality Date  . CARDIAC CATHETERIZATION N/A 05/02/2015   Procedure: Right/Left Heart Cath and Coronary Angiography;  Surgeon: Burnell Blanks, MD;  Location: [facility];  Service: Cardiovascular;  Laterality: N/A;  . CARDIOVERSION N/A 10/27/2018   Procedure: CARDIOVERSION;  Surgeon: Fay Records, MD;  Location: [facility] ENDOSCOPY;  Service: Cardiovascular;  Laterality: N/A;  . COLONOSCOPY N/A 02/07/2013   Procedure: COLONOSCOPY;  Surgeon: Juanita Craver, MD;  Location: [facility] ENDOSCOPY;  Service: Endoscopy;  Laterality: N/A;  . COLONOSCOPY N/A 02/09/2013   Procedure: COLONOSCOPY;  Surgeon: Beryle Beams, MD;  Location: [facility];  Service: Endoscopy;  Laterality: N/A;  . ESOPHAGOGASTRODUODENOSCOPY N/A 02/07/2013   Procedure: ESOPHAGOGASTRODUODENOSCOPY (EGD);  Surgeon: Juanita Craver, MD;  Location: [facility] ENDOSCOPY;  Service: Endoscopy;  Laterality: N/A;  . GIVENS CAPSULE STUDY N/A 02/09/2013   Procedure: GIVENS CAPSULE STUDY;  Surgeon: Beryle Beams, MD;  Location: [place];  Service: Endoscopy;  Laterality: N/A;  . HERNIA REPAIR     umbilical hernia  . KNEE SURGERY Right    multiple knee surgeries due to complication of R TKA  . LAMINOTOMY  1610   c6-t2  . PACEMAKER IMPLANT N/A 04/27/2018   Procedure: PACEMAKER IMPLANT;  Surgeon: Constance Haw, MD;  Location: [facility];  Service: Cardiovascular;  Laterality: N/A;  . PROSTATE BIOPSY N/A 04/12/2019   Procedure: BIOPSY TRANSRECTAL ULTRASONIC PROSTATE (TUBP) CYSTOSCOPY;  Surgeon: [name] Hughs, MD;  Location: [facility];  Service: Urology;  Laterality: N/A;  . SPINE SURGERY    . TEE WITHOUT CARDIOVERSION N/A 03/07/2015   Procedure: TRANSESOPHAGEAL ECHOCARDIOGRAM (TEE);  Surgeon: Josue Hector, MD;  Location: [facility] ENDOSCOPY;  Service: Cardiovascular;  Laterality: N/A;  ANES TO BRING PROPOFOL PER DOCTOR  . TEE WITHOUT CARDIOVERSION N/A  06/10/2015   Procedure: TRANSESOPHAGEAL ECHOCARDIOGRAM (TEE);  Surgeon: Burnell Blanks, MD;  Location: [facility];  Service: Open Heart Surgery;  Laterality: N/A;  . TOTAL KNEE ARTHROPLASTY     right   x4 left x 1   . TRANSCATHETER AORTIC VALVE REPLACEMENT, TRANSFEMORAL Left 06/10/2015   Procedure: TRANSCATHETER AORTIC VALVE REPLACEMENT, TRANSFEMORAL;  Surgeon: Burnell Blanks, MD;  Location: [facility];  Service: Open Heart Surgery;  Laterality: Left;    Social History  reports that he quit smoking about 14 years ago. His smoking use included pipe. He quit after 30.00 years of use. He has never used smokeless tobacco. He reports that he does not drink alcohol and does not use drugs.  Allergies  Allergen Reactions  . Sulfa Antibiotics     Other reaction(s): Unknown  . Coumadin [Warfarin Sodium] Other (See Comments)    States he can't be on this-bleeds out    Family History  Problem Relation Age of Onset  . Other Father        POSSIBLE HEART ATTACK  .  Arthritis Sister        "crippling"  . Prostate cancer Brother   . Arthritis Sister   . Breast cancer Neg Hx   . Colon cancer Neg Hx    Prior to Admission medications   Medication Sig Start Date End Date Taking? Authorizing Provider  acetaminophen (TYLENOL) 500 MG tablet Take 2,000 mg by mouth 2 (two) times daily.    [provider]  albuterol (VENTOLIN HFA) 108 (90 Base) MCG/ACT inhaler TAKE 2 PUFFS BY MOUTH EVERY 6 HOURS AS NEEDED Patient taking differently: Inhale 2 puffs into the lungs every 6 (six) hours as needed for wheezing or shortness of breath. 12/25/20   Midge Minium, MD  allopurinol (ZYLOPRIM) 100 MG tablet Take 1 tablet (100 mg total) by mouth daily. 08/19/20   [name], MD  atorvastatin (LIPITOR) 80 MG tablet 1 tablet by mouth daily Patient taking differently: Take 80 mg by mouth daily. 10/22/20   [name], MD  cetirizine (ZYRTEC) 10 MG tablet Take 1 tablet (10 mg total) by mouth  daily. Patient not taking: Reported on 02/13/2021 12/25/20   Midge Minium, MD  Cholecalciferol (VITAMIN D3) 125 MCG (5000 UT) CAPS Take 1,000 Units by mouth daily.    [provider]  Coenzyme Q10 (COQ10) 100 MG CAPS Take 100 mg by mouth daily.    [provider]  ELIQUIS 5 MG TABS tablet TAKE 1 TABLET BY MOUTH TWICE A DAY Patient taking differently: Take 5 mg by mouth 2 (two) times daily. 07/28/20   Josue Hector, MD  famotidine (PEPCID) 40 MG tablet TAKE 1 TABLET BY MOUTH EVERY DAY Patient taking differently: Take 40 mg by mouth daily. 08/11/20   [name], MD  Ferrous Fumarate-Folic Acid (HEMATINIC/FOLIC ACID) 973-5 MG TABS Take 1 tablet by mouth daily. Patient not taking: Reported on 02/13/2021 07/08/20   [name], MD  Fluticasone-Umeclidin-Vilant (TRELEGY ELLIPTA) 100-62.5-25 MCG/INH AEPB Inhale 1 puff into the lungs daily. 05/30/20   Deneise Lever, MD  furosemide (LASIX) 20 MG tablet Take 1 tablet (20 mg total) by mouth 2 (two) times daily. 09/25/20   Josue Hector, MD  gabapentin (NEURONTIN) 300 MG capsule Take 1 capsule (300 mg total) by mouth 3 (three) times daily. Patient taking differently: Take 300-600 mg by mouth 2 (two) times daily. 09/25/20   [name], MD  Misc Natural Products (TART CHERRY ADVANCED PO) Take 1,200 mg by mouth 2 (two) times a day.     [provider]  Multiple Vitamins-Minerals (CENTRUM SILVER PO) Take 1 tablet by mouth daily.    [provider]  Phenazopyridine HCl (AZO-STANDARD PO) Take 1 tablet by mouth 2 (two) times daily.    [provider]  polyethylene glycol (MIRALAX / GLYCOLAX) packet Take 17 g by mouth daily.    [provider]  vitamin C (ASCORBIC ACID) 500 MG tablet Take 500 mg by mouth daily.    [provider]    Physical Exam: Vitals:   02/15/21 0030 02/15/21 0100 02/15/21 0123 02/15/21 0414  BP: (!) 114/58 (!) 113/57  (!) 108/57  Pulse: 66 62  67  Resp: 20  (!) 24  20  Temp:   97.6 F (36.4 C) 97.8 F (36.6 C)  TempSrc:   Oral Oral  SpO2: 96% 96%  98%  Weight:      Height:        Constitutional: NAD, calm, comfortable Eyes: PERRL, lids and conjunctivae  normal ENMT: Mucous membranes are moist. Posterior pharynx clear of any exudate or lesions. Neck: normal, supple, no masses, no thyromegaly Respiratory: Mildly tachypneic.  Decreased breath sounds on bases, no wheezing, no crackles.  No accessory muscle use.  Cardiovascular: Regular rate and rhythm, no murmurs / rubs / gallops. No extremity edema. 2+ pedal pulses. No carotid bruits.  Abdomen: Morbidly obese.  Soft, no tenderness, no masses palpated. No hepatosplenomegaly. Musculoskeletal: no clubbing / cyanosis. Good ROM, no contractures. Normal muscle tone.  Skin: Chronic stasis dermatitis with some areas of induration and stage 3 lymphedema. Neurologic: CN 2-12 grossly intact. Sensation intact, DTR normal. Strength 5/5 in all 4.  Psychiatric: Normal judgment and insight. Alert and oriented x 3. Normal mood.   Labs on Admission: I have personally reviewed following labs and imaging studies  CBC: Recent Labs  Lab 02/12/21 1220 02/12/21 1300 02/12/21 1604 02/13/21 0255 02/14/21 0127 02/14/21 1935 02/14/21 2120  WBC 8.3  --   --  5.5 6.6 10.6*  --   NEUTROABS 6.1  --   --   --   --   --   --   HGB 12.1*   < > 12.2* 11.6* 11.5* 12.2* 12.6*  HCT 39.2   < > 36.0* 37.5* 36.7* 38.4* 37.0*  MCV 107.4*  --   --  110.0* 106.7* 106.4*  --   PLT 148*  --   --  108* 111* 131*  --    < > = values in this interval not displayed.    Basic Metabolic Panel: Recent Labs  Lab 02/12/21 1220 02/12/21 1300 02/12/21 1604 02/13/21 0255 02/14/21 0127 02/14/21 1935 02/14/21 2120  NA 142   < > 140 143 140 137 137  K 4.2   < > 4.3 4.4 3.8 3.9 4.0  CL 98  --   --  99 97* 94*  --   CO2 36*  --   --  36* 37* 36*  --   GLUCOSE 107*  --   --  88 103* 127*  --   BUN 41*  --   --  34* 31* 32*  --    CREATININE 1.49*  --   --  1.31* 1.17 1.67*  --   CALCIUM 10.0  --   --  9.2 9.0 8.9  --   MG  --   --   --  2.0  --   --   --   PHOS  --   --   --  4.2  --   --   --    < > = values in this interval not displayed.    GFR: Estimated Creatinine Clearance: 60.8 mL/min (A) (by C-G formula based on SCr of 1.67 mg/dL (H)).  Liver Function Tests: Recent Labs  Lab 02/12/21 1220  AST 23  ALT 21  ALKPHOS 65  BILITOT 0.7  PROT 6.3*  ALBUMIN 4.0    Urine analysis:    Component Value Date/Time   COLORURINE AMBER (A) 02/15/2021 0357   APPEARANCEUR CLOUDY (A) 02/15/2021 0357   LABSPEC 1.017 02/15/2021 [place] 8.0 02/15/2021 0357   GLUCOSEU NEGATIVE 02/15/2021 0357        HGBUR SMALL (A) 02/15/2021 [place] 02/15/2021 [place] 02/15/2021 0357   PROTEINUR 100 (A) 02/15/2021 0357        NITRITE NEGATIVE 02/15/2021 0357   LEUKOCYTESUR LARGE (  A) 02/15/2021 0357   Radiological Exams on Admission: DG Chest Portable 1 View  Result Date: 02/14/2021 CLINICAL DATA:  Weakness and shortness of breath EXAM: PORTABLE CHEST 1 VIEW COMPARISON:  02/12/2021 FINDINGS: Cardiac shadow is enlarged but stable. Pacing device is again seen. Slight increase in the degree of right basilar airspace opacity when compare with the prior study. No other focal abnormality is noted. IMPRESSION: Slight increase in the degree of right basilar airspace opacity when compared with the prior study. Electronically Signed   By: Inez Catalina M.D.   On: 02/14/2021 20:08   ECHOCARDIOGRAM COMPLETE  Result Date: 02/13/2021    ECHOCARDIOGRAM REPORT   Patient Name:   HELMUTH RECUPERO Date of Exam: 02/13/2021 Medical Rec #:  [id number]     Height:       74.0 in Accession #:    [id number]    Weight:       389.1 lb Date of Birth:  1941-06-13    BSA:          2.884 m Patient Age:    12 years      BP:           126/58 mmHg Patient Gender: M             HR:           56 bpm. Exam  Location:  Inpatient Procedure: 2D Echo, Cardiac Doppler and Color Doppler Indications:    Respiratory distress  History:        Patient has prior history of Echocardiogram examinations, most                 recent 05/09/2020. Risk Factors:Dyslipidemia and Hypertension.  Sonographer:    Cammy Brochure Referring Phys: [id number] [place]  1. Severe calcific MS is present. MG 16 mmHG @ 77 bpm. MVA 1.02 cm2, DI 0.35. The mitral valve is degenerative. No evidence of mitral valve regurgitation. Severe mitral stenosis. Severe mitral annular calcification.  2. Left ventricular ejection fraction, by estimation, is >75%. The left ventricle has hyperdynamic function. The left ventricle has no regional wall motion abnormalities. There is moderate concentric left ventricular hypertrophy. Left ventricular diastolic function could not be evaluated. There is the interventricular septum is flattened in systole and diastole, consistent with right ventricular pressure and volume overload.  3. Right ventricular systolic function is mildly reduced. The right ventricular size is moderately enlarged. There is mildly elevated pulmonary artery systolic pressure. The estimated right ventricular systolic pressure is 73.7 mmHg.  4. Left atrial size was moderately dilated.  5. Right atrial size was mildly dilated.  6. 26 mm S3. Vmax 2.2 m/s, MG 10 mmHG, EOA 2.0 cm2. No regurgitation. Normal prosthesis . The aortic valve has been repaired/replaced. Aortic valve regurgitation is not visualized. Procedure Date: 2016. Echo findings are consistent with normal structure  and function of the aortic valve prosthesis.  7. The inferior vena cava is normal in size with greater than 50% respiratory variability, suggesting right atrial pressure of 3 mmHg. Comparison(s): No significant change from prior study. FINDINGS  Left Ventricle: Left ventricular ejection fraction, by estimation, is >75%. The left ventricle has hyperdynamic function.  The left ventricle has no regional wall motion abnormalities. The left ventricular internal cavity size was normal in size. There is moderate concentric left ventricular hypertrophy. The interventricular septum is flattened in systole and diastole, consistent with right ventricular pressure and volume overload. Left ventricular diastolic function could not be evaluated  due to mitral annular calcification (moderate or greater). Left ventricular diastolic function could not be evaluated. Right Ventricle: The right ventricular size is moderately enlarged. No increase in right ventricular wall thickness. Right ventricular systolic function is mildly reduced. There is mildly elevated pulmonary artery systolic pressure. The tricuspid regurgitant velocity is 2.90 m/s, and with an assumed right atrial pressure of 3 mmHg, the estimated right ventricular systolic pressure is 90.3 mmHg. Left Atrium: Left atrial size was moderately dilated. Right Atrium: Right atrial size was mildly dilated. Pericardium: Trivial pericardial effusion is present. Mitral Valve: Severe calcific MS is present. MG 16 mmHG @ 77 bpm. MVA 1.02 cm2, DI 0.35. The mitral valve is degenerative in appearance. Severe mitral annular calcification. No evidence of mitral valve regurgitation. Severe mitral valve stenosis. MV peak  gradient, 34.3 mmHg. The mean mitral valve gradient is 16.0 mmHg with average heart rate of 77 bpm. Tricuspid Valve: The tricuspid valve is grossly normal. Tricuspid valve regurgitation is mild . No evidence of tricuspid stenosis. Aortic Valve: 26 mm S3. Vmax 2.2 m/s, MG 10 mmHG, EOA 2.0 cm2. No regurgitation. Normal prosthesis. The aortic valve has been repaired/replaced. Aortic valve regurgitation is not visualized. Aortic valve mean gradient measures 10.0 mmHg. Aortic valve peak gradient measures 19.4 mmHg. Aortic valve area, by VTI measures 2.00 cm. There is a 26 mm Sapien prosthetic, stented (TAVR) valve present in the aortic  position. Echo findings are consistent with normal structure and function of the aortic valve prosthesis. Pulmonic Valve: The pulmonic valve was grossly normal. Pulmonic valve regurgitation is not visualized. No evidence of pulmonic stenosis. Aorta: The aortic root is normal in size and structure. Venous: The inferior vena cava is normal in size with greater than 50% respiratory variability, suggesting right atrial pressure of 3 mmHg. IAS/Shunts: The atrial septum is grossly normal. Additional Comments: A device lead is visualized in the right atrium and right ventricle.  LEFT VENTRICLE PLAX 2D LVIDd:         5.40 cm  Diastology LVIDs:         3.40 cm  LV e' lateral: 4.68 cm/s LV PW:         1.50 cm LV IVS:        1.50 cm LVOT diam:     1.90 cm LV SV:         84 LV SV Index:   29 LVOT Area:     2.84 cm  RIGHT VENTRICLE             IVC RV Basal diam:  5.30 cm     IVC diam: 2.10 cm RV S prime:     13.50 cm/s TAPSE (M-mode): 1.9 cm LEFT ATRIUM              Index       RIGHT ATRIUM           Index LA diam:        5.00 cm  1.73 cm/m  RA Area:     26.10 cm LA Vol (A2C):   129.0 ml 44.73 ml/m RA Volume:   86.30 ml  29.92 ml/m LA Vol (A4C):   132.0 ml 45.77 ml/m LA Biplane Vol: 133.0 ml 46.12 ml/m  AORTIC VALVE AV Area (Vmax):    2.23 cm AV Area (Vmean):   1.99 cm AV Area (VTI):     2.00 cm AV Vmax:           220.00 cm/s AV Vmean:  147.000 cm/s AV VTI:            0.420 m AV Peak Grad:      19.4 mmHg AV Mean Grad:      10.0 mmHg LVOT Vmax:         173.00 cm/s LVOT Vmean:        103.000 cm/s LVOT VTI:          0.296 m LVOT/AV VTI ratio: 0.70  AORTA Ao Root diam: 2.60 cm MITRAL VALVE             TRICUSPID VALVE MV Area VTI:  1.02 cm   TR Peak grad:   33.6 mmHg MV Peak grad: 34.3 mmHg  TR Vmax:        290.00 cm/s MV Mean grad: 16.0 mmHg MV Vmax:      2.93 m/s   SHUNTS MV Vmean:     184.0 cm/s Systemic VTI:  0.30 m MV VTI:       0.82 m     Systemic Diam: 1.90 cm Eleonore Chiquito MD Electronically signed by Eleonore Chiquito MD Signature Date/Time: 02/13/2021/3:23:21 PM    Final     EKG: Independently reviewed. Vent. rate 95 BPM PR interval 65 ms QRS duration 151 ms QT/QTcB 395/486 ms P-R-T axes -47 -65 102 Sinus or ectopic atrial rhythm Multiple ventricular premature complexes Short PR interval Probable left atrial enlargement LVH with IVCD, LAD and secondary repol abnrm Probable inferior infarct  Assessment/Plan Principal Problem:   Acute respiratory failure with hypoxia (HCC)   Obstructive sleep apnea   Obesity hypoventilation syndrome (HCC) Volume overload and/or infectious airspace disease. Place in observation/PCU. Continue supplemental oxygen. Continue bronchodilators. Continue BiPAP at bedtime and as needed. Lifestyle modifications to induce weight loss. Continue IV antibiotics pending procalcitonin level.  Active Problems:   Chronic diastolic heart failure Holding furosemide due to soft BP/azotemia. Sodium restriction. Monitor daily weights, intake and output.    HYPERCHOLESTEROLEMIA Continue atorvastatin 80 mg p.o. daily.    Macrocytic anemia Had a normal anemia panel last month. Monitor hematocrit and hemoglobin.    Obesity, Class III, BMI 40-49.9 (morbid obesity) (HCC) Lifestyle modifications. Follow-up with PCP or endocrinology as an outpatient.    COPD mixed type ([place]) Continue supplemental oxygen. Continue bronchodilators as needed.    Essential hypertension Blood pressure has been soft. Hold furosemide 20 mg p.o. daily. Monitor blood pressure, renal function electrolytes.    Acute renal failure on CKD stage 3 secondary to diabetes (HCC) Hold diuretic. Avoid hypotension. Avoid nephrotoxic medications. Monitor intake and output. Follow-up renal function electrolytes.     DVT prophylaxis: On Eliquis. Code Status:   Full code. Family Communication:   Disposition Plan:   Patient is from:  Home.  Anticipated DC to:  Home.  Anticipated DC  date:  02/16/2021 or 02/17/2021.  Anticipated DC barriers: Clinical status. Consults called:  TOC team. Admission status:  Observation/progressive care unit.  Severity of Illness:  High severity due to presenting with acute on chronic respiratory failure in the setting of volume overload and/or lung airspace disease.   Reubin Milan MD [facility]  How to contact the [facility] Attending or Consulting provider [place] or covering provider during after hours [place], for this patient?   1. Check the care team in [facility] and look for a) attending/consulting [facility] provider listed and b) the [facility] team listed 2. Log into [email or web address] and use [place]'s universal password to access. If you do not have  the password, please contact the hospital operator. 3. Locate the [facility] provider you are looking for under [facility] and page to a number that you can be directly reached. 4. If you still have difficulty reaching the provider, please page the [facility] (Director on Call) for the Hospitalists listed on amion for assistance.  02/15/2021, 5:01 AM   This document was prepared using Dragon voice recognition software and may contain some unintended transcription errors.

## 2021-02-15 NOTE — ED Notes (Signed)
Pt assisted with urinal

## 2021-02-15 NOTE — NC FL2 (Signed)
[place] LEVEL OF CARE SCREENING TOOL     IDENTIFICATION  Patient Name: [name] Birthdate: 09-14-1941 Sex: male Admission Date (Current Location): 02/14/2021  [facility] and [place] Number:  Herbalist and Address:  [facility], [place] [address], [place], [place] [address]      Provider Number: [id number]  Attending Physician Name and Address:  [place] Eric J, DO  Relative Name and Phone Number:  [name] [phone]    Current Level of Care: Hospital Recommended Level of Care: [place] Prior Approval Number:    Date Approved/Denied:   PASRR Number: [id number]  Discharge Plan: SNF    Current Diagnoses: Patient Active Problem List   Diagnosis Date Noted  . Acute respiratory failure with hypoxia ([place]) 02/15/2021  . CKD stage 3 secondary to diabetes ([place]) 02/15/2021  . Obesity hypoventilation syndrome ([place]) 02/15/2021  . Acute on chronic respiratory failure with hypoxemia ([place]) 02/15/2021  . Respiratory failure ([place]) 02/12/2021  . Acute on chronic respiratory failure with hypoxia and hypercapnia ([place]) 02/12/2021  . Abnormal albumin 06/15/2020  . Proteins serum plasma low 06/02/2020  . Injury of toe on right foot 05/20/2020  . Chronic respiratory failure with hypoxia ([place]) 02/26/2020  . Coagulation defect ([place]) 12/07/2019  . Malignant neoplasm of prostate ([place]) 05/15/2019  . Pain due to onychomycosis of toenails of both feet 03/13/2019  . PVD (peripheral vascular disease) ([place]) 03/13/2019  . Prostate cancer ([place]) 02/13/2019  . Urinary tract infection 12/16/2018  . Persistent atrial fibrillation ([place]) 10/17/2018  . Primary osteoarthritis of left shoulder 08/23/2018  . Renal insufficiency 06/29/2018  . Bradycardia 04/26/2018  . Other fatigue 12/06/2017  . Shortness of breath on exertion 12/06/2017  . Essential hypertension 12/06/2017  . Hyperglycemia 07/28/2017  . Neck pain on left side  01/25/2017  . Low back pain 01/25/2017  . Constipation 08/15/2015  . Severe aortic valve stenosis   . Medicare annual wellness visit, subsequent 03/09/2015  . Preventative health care 03/09/2015  . Allergic rhinitis 12/02/2014  . COPD mixed type ([place]) 12/02/2014  . Chronic venous insufficiency 10/02/2013  . Venous stasis dermatitis 10/02/2013  . AVM (arteriovenous malformation) of colon with hemorrhage 02/10/2013  . Thrombocytopenia, unspecified ([place]) 02/09/2013  . EDEMA 04/15/2010  . HYPERCHOLESTEROLEMIA 04/14/2010  . Gout 04/14/2010  . Macrocytic anemia 04/14/2010  . Aortic valve disorder 04/14/2010  . VALVULAR HEART DISEASE 04/14/2010  . Osteoarthritis 04/14/2010  . Multilevel degenerative disc disease 04/14/2010  . BURSITIS 04/14/2010  . Obesity, Class III, BMI 40-49.9 (morbid obesity) ([place]) 03/21/2010  . Obstructive sleep apnea 02/19/2008    Orientation RESPIRATION BLADDER Height & Weight     Self,Time,Situation,Place  O2 (Nasal Cannula 3 liters) Continent Weight: (!) 381 lb 6.3 oz (173 kg) Height:  6\' 2"  (188 cm)  BEHAVIORAL SYMPTOMS/MOOD NEUROLOGICAL BOWEL NUTRITION STATUS      Continent Diet (Please see discharge summary)  AMBULATORY STATUS COMMUNICATION OF NEEDS Skin   Limited Assist Verbally Other (Comment) (Abrasion arm,elbow,bilateral,Foam,Cellulitis Leg,bilateral,Cracking leg,bilateral,ecchymosis arm,bilateral foam,wound incis.open or dehiced MASD perineum medial,skin tear buttocks medial,skin tear arm anterior lower proximal Right)                       Personal Care Assistance Level of Assistance  Bathing,Feeding,Dressing Bathing Assistance: Maximum assistance Feeding assistance: Independent Dressing Assistance: Maximum assistance     Functional Limitations Info  Sight,Hearing,Speech Sight Info: Adequate Hearing Info: Adequate Speech Info: Adequate    SPECIAL CARE  FACTORS FREQUENCY  PT (By licensed PT),OT (By licensed OT)     PT Frequency: 5x  min weekly OT Frequency: 5x min weekly            Contractures Contractures Info: Not present    Additional Factors Info  Code Status,Allergies Code Status Info: FULL Allergies Info: Sulfa Antibiotics,Coumadin (warfarin Sodium)           Current Medications (02/15/2021):  This is the current hospital active medication list Current Facility-Administered Medications  Medication Dose Route Frequency Provider Last Rate Last Admin  . acetaminophen (TYLENOL) tablet 1,000 mg  1,000 mg Oral Q6H PRN Reubin Milan, MD       Or  . acetaminophen (TYLENOL) suppository 650 mg  650 mg Rectal Q6H PRN Reubin Milan, MD      . albuterol (PROVENTIL) (2.5 MG/3ML) 0.083% nebulizer solution 2.5 mg  2.5 mg Nebulization Q2H PRN Reubin Milan, MD      . allopurinol (ZYLOPRIM) tablet 100 mg  100 mg Oral Daily Reubin Milan, MD   100 mg at 02/15/21 6226  . apixaban (ELIQUIS) tablet 5 mg  5 mg Oral BID Reubin Milan, MD   5 mg at 02/15/21 3335  . ascorbic acid (VITAMIN C) tablet 500 mg  500 mg Oral Daily Reubin Milan, MD   500 mg at 02/15/21 4562  . atorvastatin (LIPITOR) tablet 80 mg  80 mg Oral Daily Reubin Milan, MD   80 mg at 02/15/21 5638  . ceFEPIme (MAXIPIME) 2 g in sodium chloride 0.9 % 100 mL IVPB  2 g Intravenous Q8H Reubin Milan, MD 200 mL/hr at 02/15/21 0825 2 g at 02/15/21 0825  . famotidine (PEPCID) tablet 40 mg  40 mg Oral Daily Reubin Milan, MD   40 mg at 02/15/21 0939  . fluticasone furoate-vilanterol (BREO ELLIPTA) 100-25 MCG/INH 1 puff  1 puff Inhalation Daily Reubin Milan, MD   1 puff at 02/15/21 1010   And  . umeclidinium bromide (INCRUSE ELLIPTA) 62.5 MCG/INH 1 puff  1 puff Inhalation Daily Reubin Milan, MD   1 puff at 02/15/21 1010  . gabapentin (NEURONTIN) capsule 300 mg  300 mg Oral BID Reubin Milan, MD   300 mg at 02/15/21 9373  . polyethylene glycol (MIRALAX / GLYCOLAX) packet 17 g  17 g Oral Daily Reubin Milan, MD   17 g at 02/15/21 4287     Discharge Medications: Please see discharge summary for a list of discharge medications.  Relevant Imaging Results:  Relevant Lab Results:   Additional Information [id number]  Trula Ore, LCSWA

## 2021-02-16 DIAGNOSIS — J9601 Acute respiratory failure with hypoxia: Secondary | ICD-10-CM | POA: Diagnosis not present

## 2021-02-16 LAB — BASIC METABOLIC PANEL
Anion gap: 6 (ref 5–15)
BUN: 35 mg/dL — ABNORMAL HIGH (ref 8–23)
CO2: 34 mmol/L — ABNORMAL HIGH (ref 22–32)
Calcium: 8.6 mg/dL — ABNORMAL LOW (ref 8.9–10.3)
Chloride: 98 mmol/L (ref 98–111)
Creatinine, Ser: 1.52 mg/dL — ABNORMAL HIGH (ref 0.61–1.24)
GFR, Estimated: 47 mL/min — ABNORMAL LOW (ref >60)
Glucose, Bld: 109 mg/dL — ABNORMAL HIGH (ref 70–99)
Potassium: 3.6 mmol/L (ref 3.5–5.1)
Sodium: 138 mmol/L (ref 135–145)

## 2021-02-16 LAB — PROCALCITONIN: Procalcitonin: 0.29 ng/mL

## 2021-02-16 LAB — MAGNESIUM: Magnesium: 2.1 mg/dL (ref 1.7–2.4)

## 2021-02-16 LAB — CBC
HCT: 34.3 % — ABNORMAL LOW (ref 39.0–52.0)
Hemoglobin: 11 g/dL — ABNORMAL LOW (ref 13.0–17.0)
MCH: 34 pg (ref 26.0–34.0)
MCHC: 32.1 g/dL (ref 30.0–36.0)
MCV: 105.9 fL — ABNORMAL HIGH (ref 80.0–100.0)
Platelets: 98 10*3/uL — ABNORMAL LOW (ref 150–400)
RBC: 3.24 MIL/uL — ABNORMAL LOW (ref 4.22–5.81)
RDW: 18.5 % — ABNORMAL HIGH (ref 11.5–15.5)
WBC: 7.8 10*3/uL (ref 4.0–10.5)
nRBC: 0 % (ref 0.0–0.2)

## 2021-02-16 LAB — URINE CULTURE: Culture: 10000 — AB

## 2021-02-16 NOTE — Progress Notes (Signed)
OT Note    02/16/21 1614  OT Visit Information  Last OT Received On 02/16/21  History of Present Illness Pt is a 80 y/o male with extensive past medical history including OSA, morbid obesity, atrial fibrillation, CHF who presents with weakness and SOB.  Patient was just discharged from the hospital on 5/27 after an ICU stay for acute on chronic respiratory failure.  EMS was called out to his house because his wife had a fall and while EMS was there, patient was unable to get up off the toilet and began having weakness and shortness of breath.  EMS recommended that he come back for evaluation. SpO2 at 78% on room air prior to arrival.  OT Time Calculation  OT Start Time (ACUTE ONLY) 1217  OT Stop Time (ACUTE ONLY) 1245  OT Time Calculation (min) 28 min  OT General Charges  $OT Visit 1 Visit  OT Evaluation  $OT Eval Moderate Complexity 1 Mod  OT Treatments  $Self Care/Home Management  8-22 mins  Maurie Boettcher, OT/L   Acute OT Clinical Specialist [place] Pager [phone] Office [phone]

## 2021-02-16 NOTE — Evaluation (Signed)
Occupational Therapy Evaluation Patient Details Name: [name] MRN: [id number] DOB: October 19, 1940 Today's Date: 02/16/2021    History of Present Illness Pt is a 80 y/o male with extensive past medical history including OSA, morbid obesity, atrial fibrillation, CHF who presents with weakness and SOB.  Patient was just discharged from the hospital on 5/27 after an ICU stay for acute on chronic respiratory failure.  EMS was called out to his house because his wife had a fall and while EMS was there, patient was unable to get up off the toilet and began having weakness and shortness of breath.  EMS recommended that he come back for evaluation. SpO2 at 78% on room air prior to arrival.   Clinical Impression   PTA, pt lived at home with his wife and was independent in most ADLs. Pt reports using a RW with bilateral platform arm support for mobility at home and a scooter in the community. Pt reports help from wife to complete lower body dressing and bring legs into bed. Pt reported calling the fire department for lift assistance OOB and off of toilet several times between prior and current hospitalization. Currently, pt requires max A for bed mobility, total assistance for lower body dressing and bathing, and min A for upper body ADLs. Pt family reports concerns with his ability to get OOB while in the hospital. Due to deficits listed below, recommend d/c to SNF with OT and will continue to follow acutely.      Follow Up Recommendations  SNF    Equipment Recommendations  None recommended by OT    Recommendations for Other Services PT consult     Precautions / Restrictions Precautions Precautions: Fall Restrictions Weight Bearing Restrictions: No      Mobility Bed Mobility Overal bed mobility: Needs Assistance Bed Mobility: Rolling Rolling: Max assist;+2 for physical assistance;+2 for safety/equipment         General bed mobility comments: Pt required increased time and effort to  roll in bed.    Transfers Overall transfer level: Needs assistance               General transfer comment: Unable to tolerate transfers at this time. +3 recommended for physical assistance to attempt transfer or sit/stand.    Balance                                           ADL either performed or assessed with clinical judgement   ADL Overall ADL's : Needs assistance/impaired Eating/Feeding: Set up;Bed level Eating/Feeding Details (indicate cue type and reason): Pt requires set up due to limited mobility. Grooming: Bed level;Set up   Upper Body Bathing: Minimal assistance;Bed level   Lower Body Bathing: Bed level;Total assistance;+2 for physical assistance;+2 for safety/equipment   Upper Body Dressing : Minimal assistance;Bed level   Lower Body Dressing: Bed level;+2 for physical assistance;+2 for safety/equipment;Total assistance Lower Body Dressing Details (indicate cue type and reason): Pt reports having assistance with LBD prior to admission.     Toileting- Clothing Manipulation and Hygiene: +2 for physical assistance;Bed level;+2 for safety/equipment;Total assistance       Functional mobility during ADLs: Maximal assistance;+2 for physical assistance;+2 for safety/equipment General ADL Comments: Pt requires max A for LB ADLs and toileting at bed level due to decreased strength, conditioning, and mobility.     Vision Baseline Vision/History: Wears glasses Wears Glasses: At all  times Vision Assessment?: No apparent visual deficits Additional Comments: Pt wearing glasses throughout session.     Perception Perception Perception Tested?: No Comments: no apparent difficulty with perception   Praxis Praxis Praxis tested?: Within functional limits Praxis-Other Comments: no apparent motor planning difficulties.    Pertinent Vitals/Pain Pain Assessment: Faces Faces Pain Scale: Hurts a little bit Pain Location: back when sitting upright and  shoulders when reaching across body Pain Intervention(s): Limited activity within patient's tolerance;Monitored during session;Repositioned     Hand Dominance Right   Extremity/Trunk Assessment Upper Extremity Assessment Upper Extremity Assessment: Generalized weakness;LUE deficits/detail LUE Deficits / Details: limited ROM to 90 degrees flexion in LUE   Lower Extremity Assessment Lower Extremity Assessment: Defer to PT evaluation       Communication Communication Communication: No difficulties   Cognition Arousal/Alertness: Awake/alert Behavior During Therapy: WFL for tasks assessed/performed Overall Cognitive Status: Within Functional Limits for tasks assessed                                 General Comments: Pt memory seems to be at baseline. Family present, no reports of changed cognition. Pt demonstrated decreaed insight of need for continued care and hopes to return home.   General Comments  Pt family in room. Pt family available to help and concerned about pt ability to get OOB. Pt reported several instances calling the fire department for lifting assistance from toilet and bed in his home between previous and current hospitalizations. VSS throughout session.    Exercises Exercises: Other exercises Other Exercises Other Exercises: Brother and wife educated on facilitating cross-body exercises to facilitate strengthening. Pt to reach across body and lift head off of pillow to touch brother/wife hand on opposite side of body. Other Exercises: Shoulder flexion AROM   Shoulder Instructions      Home Living Family/patient expects to be discharged to:: Private residence Living Arrangements: Spouse/significant other;Children Available Help at Discharge: Family;Available 24 hours/day Type of Home: House Home Access: Ramped entrance     Home Layout: One level     Bathroom Shower/Tub: Other (comment) (sponge bathes)   Bathroom Toilet: Handicapped  height Bathroom Accessibility: Yes   Home Equipment: Youth worker - 2 wheels   Additional Comments: has a lift chair      Prior Functioning/Environment Level of Independence: Needs assistance  Gait / Transfers Assistance Needed: walks with a bilateral PFRW inside his home and uses a power w/c when out in the community ADL's / Homemaking Assistance Needed: wife and son assist with LBD and occasionally help getting legs into bed. Pt sponge bathes.   Comments: Pt states he sits on the toilet to bathe and hasn't gotten in his shower for 4 years. Lift chair at home        OT Problem List: Decreased strength;Decreased range of motion;Decreased activity tolerance;Obesity;Pain      OT Treatment/Interventions: Self-care/ADL training;Therapeutic exercise;DME and/or AE instruction;Therapeutic activities;Patient/family education;Energy conservation    OT Goals(Current goals can be found in the care plan section) Acute Rehab OT Goals Patient Stated Goal: to go home OT Goal Formulation: With patient Time For Goal Achievement: 03/03/21 Potential to Achieve Goals: Good ADL Goals Pt Will Perform Lower Body Bathing: with mod assist;sitting/lateral leans Pt Will Perform Lower Body Dressing: with mod assist;sitting/lateral leans Pt Will Transfer to Toilet: with max assist;squat pivot transfer Pt Will Perform Toileting - Clothing Manipulation and hygiene: with mod assist;with adaptive equipment;sit to/from stand  Additional ADL Goal #1: Pt will complete bed mobility with Min A.  OT Frequency: Min 2X/week   Barriers to D/C:            Co-evaluation              AM-PAC OT "6 Clicks" Daily Activity     Outcome Measure Help from another person eating meals?: A Little Help from another person taking care of personal grooming?: A Little Help from another person toileting, which includes using toliet, bedpan, or urinal?: Total Help from another person bathing (including washing,  rinsing, drying)?: A Lot Help from another person to put on and taking off regular upper body clothing?: A Little Help from another person to put on and taking off regular lower body clothing?: Total 6 Click Score: 13   End of Session Equipment Utilized During Treatment: Oxygen (2L) Nurse Communication: Mobility status  Activity Tolerance: Patient tolerated treatment well Patient left: in bed;with call bell/phone within reach;with family/visitor present;with bed alarm set  OT Visit Diagnosis: Muscle weakness (generalized) (M62.81);Pain Pain - part of body: Shoulder (back)                Time: 1100-3496 OT Time Calculation (min): 28 min Charges:     Shanda Howells, OTDS   Shanda Howells 02/16/2021, 4:11 PM

## 2021-02-16 NOTE — TOC Progression Note (Addendum)
Transition of Care [facility]) - Progression Note    Patient Details  Name: [name] MRN: [id number] Date of Birth: January 17, 1941  Transition of Care [facility]) CM/SW [place], [place] Phone Number: 02/16/2021, 11:41 AM  Clinical Narrative:     Update- CSW received bed offers for patient. CSW provided patient with SNF bed offers. Patient accepted SNF bed offer with [facility]. CSW confirmed with [facility] that they can accept patient for SNF placement when medically ready. [facility] confirmed they will start insurance authorization for patient.   Patient has SN[facility] bed [facility]. Insurance authorization is pending.  CSW will continue to follow.  No current SNF bed offers at this time. Patient updated.Patient gave CSW permission to fax out [facility] [place]. CSW will continue to follow and assist with discharge planning needs.    Expected Discharge Plan: [place] Barriers to Discharge: Continued Medical Work up  Expected Discharge Plan and Services Expected Discharge Plan: [place] In-house Referral: Clinical Social Work     Living arrangements for the past 2 months: Single Family Home                                       Social Determinants of Health (SDOH) Interventions    Readmission Risk Interventions No flowsheet data found.

## 2021-02-16 NOTE — Progress Notes (Signed)
Hematuria and small blood clot noted w/ urinary output this AM.

## 2021-02-16 NOTE — Progress Notes (Signed)
PROGRESS NOTE    [name]  FEO:[id number] DOB: Apr 09, 1941 DOA: 02/14/2021 PCP: [name], MD    Brief Narrative:  [name] is a 80 year old male with past medical history significant for paroxysmal atrial fibrillation, aortic stenosis s/p TAVR, chronic diastolic congestive heart failure, degenerative disc disease, gout, hyperlipidemia, hypertension, morbid obesity, OSA/hypoventilation syndrome, OA, CKD3a, peripheral neuropathy, CAD, history of prostate cancer, venous stasis dermatitis who presented to [facility] ED on 5/28 via EMS for weakness and dyspnea.  Patient reports was unable to get off the toilet with associated shortness of breath and generalized weakness.  Patient denies headache, no fever/chills, no nausea/vomiting/diarrhea, no palpitations, no diaphoresis, no abdominal pain.  Patient recently hospitalized at [facility] long hospital from  5/26 through 5/28 for decompensated OSA/OHS.  During that hospitalization, his BiPAP settings were optimized with a new facemask.  Patient also reports during the hospitalization he had very little mobility which he believes caused deconditioning when he returned home.  In the ED, temperature 99.2 F, HR 89, RR 29, BP 92/52, SPO2 97% on 3 L nasal cannula.  Sodium 137, potassium 3.9, chloride 94, CO2 36, glucose 127, BUN 32, creatinine 1.67.  WBC 10.6 (was 6.6 on 5/28 at 0127am), hemoglobin 12.2, platelets 131.  COVID-19 PCR negative.  Influenza A/B PCR negative.  Urinalysis with large leukocytes, negative nitrite, rare bacteria, greater than 50 WBCs.  VBG with pH 7.49, PaCO2 47.5, PaO2 56.  Chest x-ray with slight increase in right basilar airspace opacity compared to prior.  Blood cultures x2 ordered.  Patient started on cefepime and vancomycin in the ED for right lower lobe pneumonia.  TRH consulted for further evaluation and management of pneumonia and progressive weakness.    Assessment & Plan:   Principal Problem:   Acute  respiratory failure with hypoxia (HCC) Active Problems:   HYPERCHOLESTEROLEMIA   Obesity, Class III, BMI 40-49.9 (morbid obesity) (HCC)   Macrocytic anemia   Obstructive sleep apnea   COPD mixed type (HCC)   Essential hypertension   CKD stage 3 secondary to diabetes ([place])   Obesity hypoventilation syndrome (HCC)   Acute on chronic respiratory failure with hypoxemia (HCC)   Acute on chronic hypoxic respiratory failure, POA Community-acquired pneumonia Patient presenting to [facility] ED with complaints of productive cough with yellow sputum.  Chest x-ray shows progressive right basilar airspace opacity.  WBC elevated 10.6, up from 6.6 on discharge earlier in the day.  Lactic acid 1.4, procalcitonin elevated 0.44. --MRSA PCR negative, discontinued vancomycin --PCT 0.44>0.29 --Continue cefepime --Continue supplemental oxygen, maintain SPO2 greater than 88%, currently on 3L [place] (baseline requirement is 3L qHS w/ CPAP) --Continue to trend procalcitonin --CBC daily  Acute renal failure on CKD stage IIIa Baseline creatinine 1.1.  On discharge on 02/14/2021 was 1.17 and presented with a creatinine of 1.65.  Etiology likely secondary to prerenal azotemia in the setting of furosemide use versus ATN with borderline hypotension. --Cr 1.65>1.52 --Holding home furosemide --Avoid nephrotoxins, renally dose all medications --BMP daily  COPD, not in acute exacerbation Not oxygen dependent during the daytime at home. --continue home Trelegy Ellipta 1 puff daily --Continue supplemental oxygen as above, likely will need continuous home oxygen on discharge, will assess closer to time of discharge  Obstructive sleep apnea Obesity hypoventilation syndrome Recently discharged by pulmonary critical care medicine on 5/28 with adjustments in his home CPAP settings.  Also fitted with a new mask. --Continue nocturnal CPAP with 3 L O2   Chronic diastolic congestive  heart failure, compensated On furosemide  20 mg p.o. twice daily.  Borderline hypotensive and notable for renal insufficiency on admission.  Chest x-ray with no pulmonary edema. --Holding home furosemide. --Strict I's and O's Daily weights  Dyslipidemia: Continue atorvastatin 80 mg p.o. daily  Peripheral neuropathy --Gabapentin 300 mg p.o. twice daily  History of gout: Allopurinol 100 mg p.o. daily  Morbid obesity Body mass index is 48.97 kg/m.  Discussed with patient needs for aggressive lifestyle changes/weight loss as this complicates all facets of care.  Outpatient follow-up with PCP.  May benefit from bariatric evaluation outpatient.  Weakness/deconditioning/debility: Patient reports significant deconditioning since recent hospitalization.  Utilizes a walker at baseline.   --PT recommending SNF, TOC working on placement --OT consult pending   DVT prophylaxis:  apixaban (ELIQUIS) tablet 5 mg    Code Status: Full Code Family Communication: No family present at bedside this morning, updated patient's spouse and son extensively yesterday afternoon at bedside.  Disposition Plan:  Level of care: Telemetry Medical Status is: Observation  The patient will require care spanning > 2 midnights and should be moved to inpatient because: Ongoing diagnostic testing needed not appropriate for outpatient work up, Unsafe d/c plan and Inpatient level of care appropriate due to severity of illness  Dispo: The patient is from: Home              Anticipated d/c is to: Home with home health likely              Patient currently is not medically stable to d/c.   Difficult to place patient No   Consultants:   none  Procedures:   None  Antimicrobials:   Vancomycin  Cefepime   Subjective: Patient seen examined bedside, resting comfortably.  Shortness of breath improved.  Seen by PT yesterday with recommendation of SNF, states will consider based on how well he progresses over the next 1-2 days.  Continues with cough, also  improved.  No other complaints or concerns at this time.  No family present at bedside this morning. Denies fever, no chills/night sweats, no headache, no visual changes, no chest pain, no palpitations, no abdominal pain, no nausea/vomiting/diarrhea, no congestion, no paresthesias.  No acute events overnight per nursing staff.  Objective: Vitals:   02/16/21 0518 02/16/21 0727 02/16/21 0744 02/16/21 0748  BP: (!) 116/53  (!) 86/52 109/64  Pulse: 67  68 71  Resp: 20  (!) 21 (!) 23  Temp: 97.8 F (36.6 C)  98.2 F (36.8 C)   TempSrc: Oral  Oral   SpO2: 96% 95% 96% 92%  Weight:      Height:        Intake/Output Summary (Last 24 hours) at 02/16/2021 1104 Last data filed at 02/16/2021 6226 Gross per 24 hour  Intake 820 ml  Output 375 ml  Net 445 ml   Filed Weights   02/14/21 1927 02/15/21 0500  Weight: (!) 171.5 kg (!) 173 kg    Examination:  General exam: Appears calm and comfortable, obese on CPAP Respiratory system: Breath sounds slightly decreased bilateral bases, normal respiratory effort, on 3 L nasal cannula room (baseline on 3L qHS only) Cardiovascular system: S1 & S2 heard, RRR. No JVD, murmurs, rubs, gallops or clicks.  Chronic venous changes bilateral lower extremities with 2+ pitting edema to mid shin Gastrointestinal system: Abdomen is nondistended, soft and nontender. No organomegaly or masses felt. Normal bowel sounds heard. Central nervous system: Alert and oriented. No focal neurological deficits. Extremities: Symmetric  5 x 5 power. Skin: Chronic venous changes bilateral lower extremities with cracking of skin Psychiatry: Judgement and insight appear normal. Mood & affect appropriate.       Data Reviewed: I have personally reviewed following labs and imaging studies  CBC: Recent Labs  Lab 02/12/21 1220 02/12/21 1300 02/13/21 0255 02/14/21 0127 02/14/21 1935 02/14/21 2120 02/15/21 0858 02/16/21 0149  WBC 8.3  --  5.5 6.6 10.6*  --  7.7 7.8  NEUTROABS  6.1  --   --   --   --   --   --   --   HGB 12.1*   < > 11.6* 11.5* 12.2* 12.6* 11.3* 11.0*  HCT 39.2   < > 37.5* 36.7* 38.4* 37.0* 36.0* 34.3*  MCV 107.4*  --  110.0* 106.7* 106.4*  --  106.5* 105.9*  PLT 148*  --  108* 111* 131*  --  100* 98*   < > = values in this interval not displayed.   Basic Metabolic Panel: Recent Labs  Lab 02/13/21 0255 02/14/21 0127 02/14/21 1935 02/14/21 2120 02/15/21 0858 02/16/21 0149  NA 143 140 137 137 138 138  K 4.4 3.8 3.9 4.0 4.0 3.6  CL 99 97* 94*  --  98 98  CO2 36* 37* 36*  --  33* 34*  GLUCOSE 88 103* 127*  --  94 109*  BUN 34* 31* 32*  --  35* 35*  CREATININE 1.31* 1.17 1.67*  --  1.65* 1.52*  CALCIUM 9.2 9.0 8.9  --  8.5* 8.6*  MG 2.0  --   --   --   --  2.1  PHOS 4.2  --   --   --   --   --    GFR: Estimated Creatinine Clearance: 67.1 mL/min (A) (by C-G formula based on SCr of 1.52 mg/dL (H)). Liver Function Tests: Recent Labs  Lab 02/12/21 1220 02/15/21 0858  AST 23 27  ALT 21 19  ALKPHOS 65 48  BILITOT 0.7 1.3*  PROT 6.3* 5.3*  ALBUMIN 4.0 2.8*   No results for input(s): LIPASE, AMYLASE in the last 168 hours. No results for input(s): AMMONIA in the last 168 hours. Coagulation Profile: Recent Labs  Lab 02/12/21 1220  INR 1.2   Cardiac Enzymes: No results for input(s): CKTOTAL, CKMB, CKMBINDEX, TROPONINI in the last 168 hours. BNP (last 3 results) No results for input(s): PROBNP in the last 8760 hours. HbA1C: No results for input(s): HGBA1C in the last 72 hours. CBG: No results for input(s): GLUCAP in the last 168 hours. Lipid Profile: No results for input(s): CHOL, HDL, LDLCALC, TRIG, CHOLHDL, LDLDIRECT in the last 72 hours. Thyroid Function Tests: No results for input(s): TSH, T4TOTAL, FREET4, T3FREE, THYROIDAB in the last 72 hours. Anemia Panel: No results for input(s): VITAMINB12, FOLATE, FERRITIN, TIBC, IRON, RETICCTPCT in the last 72 hours. Sepsis Labs: Recent Labs  Lab 02/12/21 1220 02/14/21 1942  02/15/21 0858 02/16/21 0149  PROCALCITON  --   --  0.44 0.29  LATICACIDVEN 1.8 1.6  --   --     Recent Results (from the past 240 hour(s))  Blood culture (routine single)     Status: None (Preliminary result)   Collection Time: 02/12/21 12:44 PM   Specimen: Left Antecubital; Blood  Result Value Ref Range Status   Specimen Description   Final    LEFT ANTECUBITAL Performed at [facility], [address], [place], [place] [address]    Special Requests   Final  Blood Culture adequate volume Performed at [facility], [address], [place], [place] [address]    Culture   Final    NO GROWTH 4 DAYS Performed at [facility], [place] [address] [place], [place] [address]    Report Status PENDING  Incomplete  Urine culture     Status: Abnormal   Collection Time: 02/12/21 12:44 PM   Specimen: In/Out Cath Urine  Result Value Ref Range Status   Specimen Description   Final    IN/OUT CATH URINE Performed at [facility], [address], [place], [place] [address]    Special Requests   Final    NONE Performed at [facility], [address], [place], [place] [address]    Culture (A)  Final    10,000 COLONIES/mL AEROCOCCUS URINAE Standardized susceptibility testing for this organism is not available. Performed at [facility], [place] [address] [place], [place] [address]    Report Status 02/16/2021 FINAL  Final  Resp Panel by RT-PCR (Flu A&B, Covid)     Status: None   Collection Time: 02/12/21 12:54 PM   Specimen: Nasopharyngeal(NP) swabs in vial transport medium  Result Value Ref Range Status   SARS Coronavirus 2 by RT PCR NEGATIVE NEGATIVE Final    Comment: (NOTE) SARS-CoV-2 target nucleic acids are NOT DETECTED.  The SARS-CoV-2 RNA is generally detectable in upper respiratory specimens during the acute phase of infection. The lowest concentration of SARS-CoV-2 viral copies this assay  can detect is 138 copies/mL. A negative result does not preclude SARS-Cov-2 infection and should not be used as the sole basis for treatment or other patient management decisions. A negative result may occur with  improper specimen collection/handling, submission of specimen other than nasopharyngeal swab, presence of viral mutation(s) within the areas targeted by this assay, and inadequate number of viral copies(<138 copies/mL). A negative result must be combined with clinical observations, patient history, and epidemiological information. The expected result is Negative.  Fact Sheet for Patients:  EntrepreneurPulse.com.au  Fact Sheet for Healthcare Providers:  IncredibleEmployment.be  This test is no t yet approved or cleared by the [place] FDA and  has been authorized for detection and/or diagnosis of SARS-CoV-2 by FDA under an Emergency Use Authorization (EUA). This EUA will remain  in effect (meaning this test can be used) for the duration of the COVID-19 declaration under Section 564(b)(1) of the Act, 21 U.S.C.section 360bbb-3(b)(1), unless the authorization is terminated  or revoked sooner.       Influenza A by PCR NEGATIVE NEGATIVE Final   Influenza B by PCR NEGATIVE NEGATIVE Final    Comment: (NOTE) The Xpert Xpress SARS-CoV-2/FLU/RSV plus assay is intended as an aid in the diagnosis of influenza from Nasopharyngeal swab specimens and should not be used as a sole basis for treatment. Nasal washings and aspirates are unacceptable for Xpert Xpress SARS-CoV-2/FLU/RSV testing.  Fact Sheet for Patients: EntrepreneurPulse.com.au  Fact Sheet for Healthcare Providers: IncredibleEmployment.be  This test is not yet approved or cleared by the [place] FDA and has been authorized for detection and/or diagnosis of SARS-CoV-2 by FDA under an Emergency Use Authorization (EUA). This EUA will  remain in effect (meaning this test can be used) for the duration of the COVID-19 declaration under Section 564(b)(1) of the Act, 21 U.S.C. section 360bbb-3(b)(1), unless the authorization is terminated or revoked.  Performed at [facility], [address] [place],  [address]   MRSA PCR Screening  Status: None   Collection Time: 02/12/21  5:55 PM   Specimen: Nasopharyngeal  Result Value Ref Range Status   MRSA by PCR NEGATIVE NEGATIVE Final    Comment:        The GeneXpert MRSA Assay (FDA approved for NASAL specimens only), is one component of a comprehensive MRSA colonization surveillance program. It is not intended to diagnose MRSA infection nor to guide or monitor treatment for MRSA infections. Performed at [facility], [place] [address], [place], [place] [address]   Resp Panel by RT-PCR (Flu A&B, Covid) Nasopharyngeal Swab     Status: None   Collection Time: 02/14/21  7:40 PM   Specimen: Nasopharyngeal Swab; Nasopharyngeal(NP) swabs in vial transport medium  Result Value Ref Range Status   SARS Coronavirus 2 by RT PCR NEGATIVE NEGATIVE Final    Comment: (NOTE) SARS-CoV-2 target nucleic acids are NOT DETECTED.  The SARS-CoV-2 RNA is generally detectable in upper respiratory specimens during the acute phase of infection. The lowest concentration of SARS-CoV-2 viral copies this assay can detect is 138 copies/mL. A negative result does not preclude SARS-Cov-2 infection and should not be used as the sole basis for treatment or other patient management decisions. A negative result may occur with  improper specimen collection/handling, submission of specimen other than nasopharyngeal swab, presence of viral mutation(s) within the areas targeted by this assay, and inadequate number of viral copies(<138 copies/mL). A negative result must be combined with clinical observations, patient history, and epidemiological information.  The expected result is Negative.  Fact Sheet for Patients:  EntrepreneurPulse.com.au  Fact Sheet for Healthcare Providers:  IncredibleEmployment.be  This test is no t yet approved or cleared by the [place] FDA and  has been authorized for detection and/or diagnosis of SARS-CoV-2 by FDA under an Emergency Use Authorization (EUA). This EUA will remain  in effect (meaning this test can be used) for the duration of the COVID-19 declaration under Section 564(b)(1) of the Act, 21 U.S.C.section 360bbb-3(b)(1), unless the authorization is terminated  or revoked sooner.       Influenza A by PCR NEGATIVE NEGATIVE Final   Influenza B by PCR NEGATIVE NEGATIVE Final    Comment: (NOTE) The Xpert Xpress SARS-CoV-2/FLU/RSV plus assay is intended as an aid in the diagnosis of influenza from Nasopharyngeal swab specimens and should not be used as a sole basis for treatment. Nasal washings and aspirates are unacceptable for Xpert Xpress SARS-CoV-2/FLU/RSV testing.  Fact Sheet for Patients: EntrepreneurPulse.com.au  Fact Sheet for Healthcare Providers: IncredibleEmployment.be  This test is not yet approved or cleared by the [place] FDA and has been authorized for detection and/or diagnosis of SARS-CoV-2 by FDA under an Emergency Use Authorization (EUA). This EUA will remain in effect (meaning this test can be used) for the duration of the COVID-19 declaration under Section 564(b)(1) of the Act, 21 U.S.C. section 360bbb-3(b)(1), unless the authorization is terminated or revoked.  Performed at [facility], [place] [address] [place], [place] [address]   Blood culture (routine x 2)     Status: None (Preliminary result)   Collection Time: 02/14/21  7:40 PM   Specimen: BLOOD LEFT WRIST  Result Value Ref Range Status   Specimen Description BLOOD LEFT WRIST  Final   Special Requests   Final    BOTTLES DRAWN  AEROBIC AND ANAEROBIC Blood Culture adequate volume   Culture   Final    NO GROWTH 2 DAYS Performed at [facility], [place]  [address] [place], [place] [address]    Report Status PENDING  Incomplete  Blood culture (routine x 2)     Status: None (Preliminary result)   Collection Time: 02/15/21  1:20 AM   Specimen: BLOOD  Result Value Ref Range Status   Specimen Description BLOOD SITE NOT SPECIFIED  Final   Special Requests   Final    BOTTLES DRAWN AEROBIC AND ANAEROBIC Blood Culture adequate volume   Culture   Final    NO GROWTH 1 DAY Performed at [facility], [place] [address] [place], [place] [address]    Report Status PENDING  Incomplete  MRSA PCR Screening     Status: None   Collection Time: 02/15/21  8:36 AM   Specimen: Nasopharyngeal  Result Value Ref Range Status   MRSA by PCR NEGATIVE NEGATIVE Final    Comment:        The GeneXpert MRSA Assay (FDA approved for NASAL specimens only), is one component of a comprehensive MRSA colonization surveillance program. It is not intended to diagnose MRSA infection nor to guide or monitor treatment for MRSA infections. Performed at [facility], [place] [address], [place], [place] [address]          Radiology Studies: DG Chest Portable 1 View  Result Date: 02/14/2021 CLINICAL DATA:  Weakness and shortness of breath EXAM: PORTABLE CHEST 1 VIEW COMPARISON:  02/12/2021 FINDINGS: Cardiac shadow is enlarged but stable. Pacing device is again seen. Slight increase in the degree of right basilar airspace opacity when compare with the prior study. No other focal abnormality is noted. IMPRESSION: Slight increase in the degree of right basilar airspace opacity when compared with the prior study. Electronically Signed   By: Inez Catalina M.D.   On: 02/14/2021 20:08        Scheduled Meds: . allopurinol  100 mg Oral Daily  . apixaban  5 mg Oral BID  . vitamin C  500 mg Oral Daily  . atorvastatin  80 mg Oral Daily  .  famotidine  40 mg Oral Daily  . fluticasone furoate-vilanterol  1 puff Inhalation Daily   And  . umeclidinium bromide  1 puff Inhalation Daily  . gabapentin  300 mg Oral BID  . polyethylene glycol  17 g Oral Daily   Continuous Infusions: . ceFEPime (MAXIPIME) IV 2 g (02/16/21 0908)     LOS: 1 day    Time spent: 41 minutes spent on chart review, discussion with nursing staff, consultants, updating family and interview/physical exam; more than 50% of that time was spent in counseling and/or coordination of care.    Zell Hylton J [place] DO [facility] Available via Epic secure chat 7am-7pm After these hours, please refer to coverage provider listed on [email or web address] 02/16/2021, 11:04 AM

## 2021-02-17 ENCOUNTER — Inpatient Hospital Stay (HOSPITAL_COMMUNITY): Payer: Medicare HMO

## 2021-02-17 DIAGNOSIS — J449 Chronic obstructive pulmonary disease, unspecified: Secondary | ICD-10-CM | POA: Diagnosis not present

## 2021-02-17 DIAGNOSIS — J9621 Acute and chronic respiratory failure with hypoxia: Secondary | ICD-10-CM | POA: Diagnosis not present

## 2021-02-17 DIAGNOSIS — E1122 Type 2 diabetes mellitus with diabetic chronic kidney disease: Secondary | ICD-10-CM

## 2021-02-17 DIAGNOSIS — N183 Chronic kidney disease, stage 3 unspecified: Secondary | ICD-10-CM

## 2021-02-17 DIAGNOSIS — R0602 Shortness of breath: Secondary | ICD-10-CM | POA: Diagnosis not present

## 2021-02-17 DIAGNOSIS — R531 Weakness: Secondary | ICD-10-CM | POA: Diagnosis not present

## 2021-02-17 DIAGNOSIS — I4891 Unspecified atrial fibrillation: Secondary | ICD-10-CM | POA: Diagnosis not present

## 2021-02-17 DIAGNOSIS — J9601 Acute respiratory failure with hypoxia: Secondary | ICD-10-CM | POA: Diagnosis not present

## 2021-02-17 LAB — CBC
HCT: 33.3 % — ABNORMAL LOW (ref 39.0–52.0)
Hemoglobin: 10.5 g/dL — ABNORMAL LOW (ref 13.0–17.0)
MCH: 33.2 pg (ref 26.0–34.0)
MCHC: 31.5 g/dL (ref 30.0–36.0)
MCV: 105.4 fL — ABNORMAL HIGH (ref 80.0–100.0)
Platelets: 97 10*3/uL — ABNORMAL LOW (ref 150–400)
RBC: 3.16 MIL/uL — ABNORMAL LOW (ref 4.22–5.81)
RDW: 18.2 % — ABNORMAL HIGH (ref 11.5–15.5)
WBC: 6.5 10*3/uL (ref 4.0–10.5)
nRBC: 0 % (ref 0.0–0.2)

## 2021-02-17 LAB — CULTURE, BLOOD (SINGLE)
Culture: NO GROWTH
Special Requests: ADEQUATE

## 2021-02-17 LAB — BASIC METABOLIC PANEL
Anion gap: 10 (ref 5–15)
BUN: 33 mg/dL — ABNORMAL HIGH (ref 8–23)
CO2: 31 mmol/L (ref 22–32)
Calcium: 8.5 mg/dL — ABNORMAL LOW (ref 8.9–10.3)
Chloride: 96 mmol/L — ABNORMAL LOW (ref 98–111)
Creatinine, Ser: 1.4 mg/dL — ABNORMAL HIGH (ref 0.61–1.24)
GFR, Estimated: 51 mL/min — ABNORMAL LOW (ref >60)
Glucose, Bld: 101 mg/dL — ABNORMAL HIGH (ref 70–99)
Potassium: 3.6 mmol/L (ref 3.5–5.1)
Sodium: 137 mmol/L (ref 135–145)

## 2021-02-17 LAB — MAGNESIUM: Magnesium: 2.2 mg/dL (ref 1.7–2.4)

## 2021-02-17 MED ORDER — FUROSEMIDE 10 MG/ML IJ SOLN
20.0000 mg | Freq: Once | INTRAMUSCULAR | Status: AC
  Administered 2021-02-17: 20 mg via INTRAVENOUS
  Filled 2021-02-17: qty 2

## 2021-02-17 NOTE — Progress Notes (Signed)
MEWS turned Yellow 5/30 07:44 AM but vitals were rechecked at 07:48 AM and MEWS was green. Continue routine vitals

## 2021-02-17 NOTE — Progress Notes (Signed)
PROGRESS NOTE    [name]  DEY:[id number] DOB: 1941-05-11 DOA: 02/14/2021 PCP: [name], MD    Chief Complaint  Patient presents with  . Weakness  . Shortness of Breath    Brief Narrative:  [name] is a 80 year old male with past medical history significant for paroxysmal atrial fibrillation, aortic stenosis s/p TAVR, chronic diastolic congestive heart failure, degenerative disc disease, gout, hyperlipidemia, hypertension, morbid obesity, OSA/hypoventilation syndrome, OA, CKD3a, peripheral neuropathy, CAD, history of prostate cancer, venous stasis dermatitis who presented to [facility] ED on 5/28 via EMS for weakness and dyspnea.  Patient reports was unable to get off the toilet with associated shortness of breath and generalized weakness.  Patient recently hospitalized at [facility] long hospital from  5/26 through 5/28 for decompensated OSA/OHS.  During that hospitalization, his BiPAP settings were optimized with a new facemask.  Patient also reports during the hospitalization he had very little mobility which he believes caused deconditioning when he returned home. Assessment & Plan:   Principal Problem:   Acute respiratory failure with hypoxia (HCC) Active Problems:   HYPERCHOLESTEROLEMIA   Obesity, Class III, BMI 40-49.9 (morbid obesity) (HCC)   Macrocytic anemia   Obstructive sleep apnea   COPD mixed type (HCC)   Essential hypertension   CKD stage 3 secondary to diabetes (HCC)   Obesity hypoventilation syndrome (HCC)   Acute on chronic respiratory failure with hypoxemia (HCC)   Acute on chronic hypoxic respiratory failure present on admission possibly combination of acute on chronic diastolic heart failure and pneumonia. Chest x-ray showing right basilar airspace opacity on admission, mildly elevated WBC count. He was started on vancomycin and cefepime and currently requiring up to 3 L of nasal cannula oxygen to keep sats greater than 90% Patient  reports he continues to feel short of breath even this morning we will get a CT chest without contrast for further evaluation. Ordered 1 dose of IV Lasix 20 mg.    Stage IIIa CKD Creatinine slightly up from baseline 1.3.  Continue to monitor    COPD Continue bronchodilators as needed. On 3 L of oxygen at night at home.   Morbidly obese Recommend outpatient follow-up with PCP patient may benefit from bariatric evaluation as outpatient. Body mass index is 48.97 kg/m.     Obstructive sleep apnea/obesity hypoventilation syndrome Continue with CPAP at night and during the day as needed.   Mild acute on chronic diastolic heart failure Continue strict intake and output and daily weights.     DVT prophylaxis: Eliquis Code Status: Full code full code Family Communication: Family at bedside Disposition:   Status is: Inpatient  Remains inpatient appropriate because:IV treatments appropriate due to intensity of illness or inability to take PO   Dispo:  Patient From: Home  Planned Disposition: [place]  Medically stable for discharge: No         Consultants:   None.    Procedures: CT chest without contrst pending    Antimicrobials: cefepime    Subjective: Pt reports feeing sob this morning,  Reports using oxygen at night, currently on oxygen even during the day.   Objective: Vitals:   02/16/21 2200 02/17/21 0324 02/17/21 0754 02/17/21 0823  BP:  138/60    Pulse: 72 60    Resp: 18 20    Temp:  97.8 F (36.6 C)    TempSrc:  Axillary    SpO2: 97% 95% 93% 91%  Weight:      Height:  Intake/Output Summary (Last 24 hours) at 02/17/2021 1048 Last data filed at 02/17/2021 0142 Gross per 24 hour  Intake 614 ml  Output 100 ml  Net 514 ml   Filed Weights   02/14/21 1927 02/15/21 0500  Weight: (!) 171.5 kg (!) 173 kg    Examination:  General exam: Appears calm and comfortable  Respiratory system: diminished air entry throughout  the lungs, no wheezing heard, tachypnea present on 3lit of [place] oxygen.  Cardiovascular system: S1 & S2 heard, RRR. JVD cannot be appreciated, pedal edema present.  Gastrointestinal system: Abdomen is nondistended, soft and nontender. Normal bowel sounds heard. Central nervous system: Alert and oriented. No focal neurological deficits. Extremities: Symmetric 5 x 5 power. Skin: No rashes, lesions or ulcers Psychiatry: Mood & affect appropriate.     Data Reviewed: I have personally reviewed following labs and imaging studies  CBC: Recent Labs  Lab 02/12/21 1220 02/12/21 1300 02/14/21 0127 02/14/21 1935 02/14/21 2120 02/15/21 0858 02/16/21 0149 02/17/21 0312  WBC 8.3   < > 6.6 10.6*  --  7.7 7.8 6.5  NEUTROABS 6.1  --   --   --   --   --   --   --   HGB 12.1*   < > 11.5* 12.2* 12.6* 11.3* 11.0* 10.5*  HCT 39.2   < > 36.7* 38.4* 37.0* 36.0* 34.3* 33.3*  MCV 107.4*   < > 106.7* 106.4*  --  106.5* 105.9* 105.4*  PLT 148*   < > 111* 131*  --  100* 98* 97*   < > = values in this interval not displayed.    Basic Metabolic Panel: Recent Labs  Lab 02/13/21 0255 02/14/21 0127 02/14/21 1935 02/14/21 2120 02/15/21 0858 02/16/21 0149 02/17/21 0312  NA 143 140 137 137 138 138 137  K 4.4 3.8 3.9 4.0 4.0 3.6 3.6  CL 99 97* 94*  --  98 98 96*  CO2 36* 37* 36*  --  33* 34* 31  GLUCOSE 88 103* 127*  --  94 109* 101*  BUN 34* 31* 32*  --  35* 35* 33*  CREATININE 1.31* 1.17 1.67*  --  1.65* 1.52* 1.40*  CALCIUM 9.2 9.0 8.9  --  8.5* 8.6* 8.5*  MG 2.0  --   --   --   --  2.1 2.2  PHOS 4.2  --   --   --   --   --   --     GFR: Estimated Creatinine Clearance: 72.9 mL/min (A) (by C-G formula based on SCr of 1.4 mg/dL (H)).  Liver Function Tests: Recent Labs  Lab 02/12/21 1220 02/15/21 0858  AST 23 27  ALT 21 19  ALKPHOS 65 48  BILITOT 0.7 1.3*  PROT 6.3* 5.3*  ALBUMIN 4.0 2.8*    CBG: No results for input(s): GLUCAP in the last 168 hours.   Recent Results (from the past  240 hour(s))  Blood culture (routine single)     Status: None   Collection Time: 02/12/21 12:44 PM   Specimen: Left Antecubital; Blood  Result Value Ref Range Status   Specimen Description   Final    LEFT ANTECUBITAL Performed at [facility], [address] [place], [place] [address]    Special Requests   Final    Blood Culture adequate volume Performed at [facility], [address] [place], [place] [address]    Culture   Final    NO GROWTH 5 DAYS Performed at  [facility], [place] [address], [place], [place] [address]    Report Status 02/17/2021 FINAL  Final  Urine culture     Status: Abnormal   Collection Time: 02/12/21 12:44 PM   Specimen: In/Out Cath Urine  Result Value Ref Range Status   Specimen Description   Final    IN/OUT CATH URINE Performed at [facility], [address] [place], [place] [address]    Special Requests   Final    NONE Performed at [facility], [address] [place], [place] [address]    Culture (A)  Final    10,000 COLONIES/mL AEROCOCCUS URINAE Standardized susceptibility testing for this organism is not available. Performed at [facility], [place] [address], [place], [place] [address]    Report Status 02/16/2021 FINAL  Final  Resp Panel by RT-PCR (Flu A&B, Covid)     Status: None   Collection Time: 02/12/21 12:54 PM   Specimen: Nasopharyngeal(NP) swabs in vial transport medium  Result Value Ref Range Status   SARS Coronavirus 2 by RT PCR NEGATIVE NEGATIVE Final    Comment: (NOTE) SARS-CoV-2 target nucleic acids are NOT DETECTED.  The SARS-CoV-2 RNA is generally detectable in upper respiratory specimens during the acute phase of infection. The lowest concentration of SARS-CoV-2 viral copies this assay can detect is 138 copies/mL. A negative result does not preclude SARS-Cov-2 infection and should not be used as the sole basis for treatment  or other patient management decisions. A negative result may occur with  improper specimen collection/handling, submission of specimen other than nasopharyngeal swab, presence of viral mutation(s) within the areas targeted by this assay, and inadequate number of viral copies(<138 copies/mL). A negative result must be combined with clinical observations, patient history, and epidemiological information. The expected result is Negative.  Fact Sheet for Patients:  EntrepreneurPulse.com.au  Fact Sheet for Healthcare Providers:  IncredibleEmployment.be  This test is no t yet approved or cleared by the [place] FDA and  has been authorized for detection and/or diagnosis of SARS-CoV-2 by FDA under an Emergency Use Authorization (EUA). This EUA will remain  in effect (meaning this test can be used) for the duration of the COVID-19 declaration under Section 564(b)(1) of the Act, 21 U.S.C.section 360bbb-3(b)(1), unless the authorization is terminated  or revoked sooner.       Influenza A by PCR NEGATIVE NEGATIVE Final   Influenza B by PCR NEGATIVE NEGATIVE Final    Comment: (NOTE) The Xpert Xpress SARS-CoV-2/FLU/RSV plus assay is intended as an aid in the diagnosis of influenza from Nasopharyngeal swab specimens and should not be used as a sole basis for treatment. Nasal washings and aspirates are unacceptable for Xpert Xpress SARS-CoV-2/FLU/RSV testing.  Fact Sheet for Patients: EntrepreneurPulse.com.au  Fact Sheet for Healthcare Providers: IncredibleEmployment.be  This test is not yet approved or cleared by the [place] FDA and has been authorized for detection and/or diagnosis of SARS-CoV-2 by FDA under an Emergency Use Authorization (EUA). This EUA will remain in effect (meaning this test can be used) for the duration of the COVID-19 declaration under Section 564(b)(1) of the Act, 21 U.S.C. section  360bbb-3(b)(1), unless the authorization is terminated or revoked.  Performed at [facility], [address] [place], [place] [address]   MRSA PCR Screening     Status: None   Collection Time: 02/12/21  5:55 PM   Specimen: Nasopharyngeal  Result Value Ref Range Status   MRSA by PCR NEGATIVE NEGATIVE Final  Comment:        The GeneXpert MRSA Assay (FDA approved for NASAL specimens only), is one component of a comprehensive MRSA colonization surveillance program. It is not intended to diagnose MRSA infection nor to guide or monitor treatment for MRSA infections. Performed at [facility], [place] [address] [place], [place] [address]   Resp Panel by RT-PCR (Flu A&B, Covid) Nasopharyngeal Swab     Status: None   Collection Time: 02/14/21  7:40 PM   Specimen: Nasopharyngeal Swab; Nasopharyngeal(NP) swabs in vial transport medium  Result Value Ref Range Status   SARS Coronavirus 2 by RT PCR NEGATIVE NEGATIVE Final    Comment: (NOTE) SARS-CoV-2 target nucleic acids are NOT DETECTED.  The SARS-CoV-2 RNA is generally detectable in upper respiratory specimens during the acute phase of infection. The lowest concentration of SARS-CoV-2 viral copies this assay can detect is 138 copies/mL. A negative result does not preclude SARS-Cov-2 infection and should not be used as the sole basis for treatment or other patient management decisions. A negative result may occur with  improper specimen collection/handling, submission of specimen other than nasopharyngeal swab, presence of viral mutation(s) within the areas targeted by this assay, and inadequate number of viral copies(<138 copies/mL). A negative result must be combined with clinical observations, patient history, and epidemiological information. The expected result is Negative.  Fact Sheet for Patients:  EntrepreneurPulse.com.au  Fact Sheet for Healthcare Providers:   IncredibleEmployment.be  This test is no t yet approved or cleared by the [place] FDA and  has been authorized for detection and/or diagnosis of SARS-CoV-2 by FDA under an Emergency Use Authorization (EUA). This EUA will remain  in effect (meaning this test can be used) for the duration of the COVID-19 declaration under Section 564(b)(1) of the Act, 21 U.S.C.section 360bbb-3(b)(1), unless the authorization is terminated  or revoked sooner.       Influenza A by PCR NEGATIVE NEGATIVE Final   Influenza B by PCR NEGATIVE NEGATIVE Final    Comment: (NOTE) The Xpert Xpress SARS-CoV-2/FLU/RSV plus assay is intended as an aid in the diagnosis of influenza from Nasopharyngeal swab specimens and should not be used as a sole basis for treatment. Nasal washings and aspirates are unacceptable for Xpert Xpress SARS-CoV-2/FLU/RSV testing.  Fact Sheet for Patients: EntrepreneurPulse.com.au  Fact Sheet for Healthcare Providers: IncredibleEmployment.be  This test is not yet approved or cleared by the [place] FDA and has been authorized for detection and/or diagnosis of SARS-CoV-2 by FDA under an Emergency Use Authorization (EUA). This EUA will remain in effect (meaning this test can be used) for the duration of the COVID-19 declaration under Section 564(b)(1) of the Act, 21 U.S.C. section 360bbb-3(b)(1), unless the authorization is terminated or revoked.  Performed at [facility], [place] [address] [place], [place] [address]   Blood culture (routine x 2)     Status: None (Preliminary result)   Collection Time: 02/14/21  7:40 PM   Specimen: BLOOD LEFT WRIST  Result Value Ref Range Status   Specimen Description BLOOD LEFT WRIST  Final   Special Requests   Final    BOTTLES DRAWN AEROBIC AND ANAEROBIC Blood Culture adequate volume   Culture   Final    NO GROWTH 3 DAYS Performed at [facility], [place] [address], [place], [place] [address]    Report Status PENDING  Incomplete  Blood culture (routine x 2)     Status: None (Preliminary result)   Collection Time:  02/15/21  1:20 AM   Specimen: BLOOD  Result Value Ref Range Status   Specimen Description BLOOD SITE NOT SPECIFIED  Final   Special Requests   Final    BOTTLES DRAWN AEROBIC AND ANAEROBIC Blood Culture adequate volume   Culture   Final    NO GROWTH 2 DAYS Performed at [facility], [address], [place], [place] [address]    Report Status PENDING  Incomplete  MRSA PCR Screening     Status: None   Collection Time: 02/15/21  8:36 AM   Specimen: Nasopharyngeal  Result Value Ref Range Status   MRSA by PCR NEGATIVE NEGATIVE Final    Comment:        The GeneXpert MRSA Assay (FDA approved for NASAL specimens only), is one component of a comprehensive MRSA colonization surveillance program. It is not intended to diagnose MRSA infection nor to guide or monitor treatment for MRSA infections. Performed at [facility], [place] [address] [place],  [address]          Radiology Studies: DG CHEST PORT 1 VIEW  Result Date: 02/17/2021 CLINICAL DATA:  Atrial fibrillation. Weakness and shortness of breath. EXAM: PORTABLE CHEST 1 VIEW COMPARISON:  02/14/2021 FINDINGS: A dual lead pacemaker remains in place. The cardiac silhouette remains mildly enlarged. Lung volumes remain low with similar appearance of bibasilar airspace opacities but slight increase of mild right midlung opacity. A small right pleural effusion is not excluded. No pneumothorax is seen. IMPRESSION: Low lung volumes with unchanged bibasilar airspace opacities and slightly increased right midlung opacity. Findings could reflect atelectasis or pneumonia. Electronically Signed   By: Logan Bores M.D.   On: 02/17/2021 10:22        Scheduled Meds: . allopurinol  100 mg Oral Daily  . apixaban  5 mg Oral BID  . vitamin C  500 mg Oral Daily  . atorvastatin  80  mg Oral Daily  . famotidine  40 mg Oral Daily  . fluticasone furoate-vilanterol  1 puff Inhalation Daily   And  . umeclidinium bromide  1 puff Inhalation Daily  . gabapentin  300 mg Oral BID  . polyethylene glycol  17 g Oral Daily   Continuous Infusions: . ceFEPime (MAXIPIME) IV 2 g (02/17/21 0806)     LOS: 2 days        Hosie Poisson, MD [facility]   To contact the attending provider between 7A-7P or the covering provider during after hours 7P-7A, please log into the web site [email or web address] and access using universal [place] password for that web site. If you do not have the password, please call the hospital operator.  02/17/2021, 10:48 AM

## 2021-02-17 NOTE — Progress Notes (Signed)
Physical Therapy Treatment Patient Details Name: [name] MRN: [id number] DOB: Sep 16, 1941 Today's Date: 02/17/2021    History of Present Illness Pt is a 80 y.o. male recently admitted to [facility] (02/12/21-02/14/21) with acute on chronic respiratory failure, now readmitted 02/14/21 due to weakness and SOB, unable tos tand up from toilet requiring EMS call. Found to be hypoxic. PMH includes OSA, HTN, obesity, afib, CHF, venous stasis dermatitis.   PT Comments    Pt progressing with mobility. Tolerated prolonged seated EOB activity and partial standing trials to bilateral platform walker; pt able to fully offload buttocks from EOB, but ultimately unable to achieve fully upright standing despite external assist. Educ pt on lift equipment options to trial next session to aid standing attempts. Pt motivated to participate and regain PLOF. Continue to recommend SNF-level therapies to maximize functional mobility and independence prior to return home.  SpO2 down to 77% on RA, quick return to >/92% on 3L O2 HR 85    Follow Up Recommendations  SNF;Supervision for mobility/OOB     Equipment Recommendations  None recommended by PT    Recommendations for Other Services       Precautions / Restrictions Precautions Precautions: Fall Restrictions Weight Bearing Restrictions: No    Mobility  Bed Mobility Overal bed mobility: Needs Assistance Bed Mobility: Supine to Sit     Supine to sit: Mod assist;HOB elevated     General bed mobility comments: Increased time and effort reliant on HOB elevated and use of bedrail, modA for HHA to elevate trunk and scoot hips to EOB    Transfers Overall transfer level: Needs assistance Equipment used: Bilateral platform walker Transfers: Sit to/from Stand Sit to Stand: Mod assist;From elevated surface         General transfer comment: Pt performed 3x sit<>stands from elevated bed height to platform RW, pt with specific preference for bed height,  walker placement and guarding from PT; each trial, pt able to fully offload buttocks from EOB with BUE support on platform walker, heavy reliance on BUE support to maintain upright, difficulty achieving full BLE/hip/trunk extension in order to achieve fully upright standing requiring return to sit; seated rest break to recover between trials  Ambulation/Gait             General Gait Details: unable this session   Stairs             Wheelchair Mobility    Modified Rankin (Stroke Patients Only)       Balance Overall balance assessment: Needs assistance Sitting-balance support: Feet supported;Single extremity supported;No upper extremity supported Sitting balance-Leahy Scale: Fair Sitting balance - Comments: Tolerated prolonged seated EOB activity with and without UE support; dependent for donning socks/shoes     Standing balance-Leahy Scale: Zero                              Cognition Arousal/Alertness: Awake/alert Behavior During Therapy: WFL for tasks assessed/performed Overall Cognitive Status: Within Functional Limits for tasks assessed                                        Exercises General Exercises - Lower Extremity Long Arc Quad: AROM;Both;Seated Hip Flexion/Marching: AROM;Both;Seated (limited range due to body habitus) Heel Raises: AROM;Both;Seated    General Comments General comments (skin integrity, edema, etc.): Pt's wife and son present, supportive. Pt  unable to progress fully upright standing this session, but content with attempts; educ pt and family on lift options available to trial next session. Pt hopeful for d/c to SNF tomorrow. SpO2 down to 77% on RA (good pleth), HR 85; increase to >/92% on 3L O2      Pertinent Vitals/Pain Pain Assessment: Faces Faces Pain Scale: Hurts a little bit Pain Location: Back with standing attempts Pain Descriptors / Indicators: Discomfort;Grimacing Pain Intervention(s): Monitored  during session;Limited activity within patient's tolerance    Home Living                      Prior Function            PT Goals (current goals can now be found in the care plan section) Progress towards PT goals: Progressing toward goals    Frequency    Min 2X/week      PT Plan Frequency needs to be updated    Co-evaluation              AM-PAC PT "6 Clicks" Mobility   Outcome Measure  Help needed turning from your back to your side while in a flat bed without using bedrails?: A Lot Help needed moving from lying on your back to sitting on the side of a flat bed without using bedrails?: A Lot Help needed moving to and from a bed to a chair (including a wheelchair)?: Total Help needed standing up from a chair using your arms (e.g., wheelchair or bedside chair)?: Total Help needed to walk in hospital room?: Total Help needed climbing 3-5 steps with a railing? : Total 6 Click Score: 8    End of Session Equipment Utilized During Treatment: Oxygen Activity Tolerance: Patient tolerated treatment well Patient left: in bed;with call bell/phone within reach;with bed alarm set Nurse Communication: Mobility status (pt seated EOB, to call when ready to return to supine) PT Visit Diagnosis: Other abnormalities of gait and mobility (R26.89)     Time: 8341-9622 PT Time Calculation (min) (ACUTE ONLY): 44 min  Charges:  $Therapeutic Exercise: 8-22 mins $Therapeutic Activity: 23-37 mins                     Mabeline Caras, PT, DPT Acute Rehabilitation Services  Pager [phone] Office [place] 02/17/2021, 5:54 PM

## 2021-02-17 NOTE — TOC Progression Note (Signed)
Transition of Care [facility]) - Progression Note    Patient Details  Name: Arlin Sass MRN: [id number] Date of Birth: 21-Apr-1941  [facility] CM/SW [place], [place] Phone Number: 02/17/2021, 2:09 PM  Clinical Narrative:     Patients insurance pending. Patient has SN[facility] bed at [facility]. CSW will continue to follow and assist with discharge planning needs.  Expected Discharge Plan: [place] Barriers to Discharge: Continued Medical Work up  Expected Discharge Plan and Services Expected Discharge Plan: [place] In-house Referral: Clinical Social Work     Living arrangements for the past 2 months: Single Family Home                                       Social Determinants of Health (SDOH) Interventions    Readmission Risk Interventions No flowsheet data found.

## 2021-02-17 NOTE — Progress Notes (Signed)
PT Cancellation Note  Patient Details Name: [name] MRN: [id number] DOB: [birth year]   Cancelled Treatment:    Reason Eval/Treat Not Completed: Patient at procedure or test/unavailable, going for CT. Will follow-up for PT treatment as schedule permits.  Mabeline Caras, PT, DPT Acute Rehabilitation Services  Pager [phone] Office [place] 02/17/2021, 12:22 PM

## 2021-02-18 ENCOUNTER — Inpatient Hospital Stay: Payer: Medicare HMO | Admitting: Acute Care

## 2021-02-18 ENCOUNTER — Inpatient Hospital Stay (HOSPITAL_COMMUNITY): Payer: Medicare HMO

## 2021-02-18 DIAGNOSIS — J449 Chronic obstructive pulmonary disease, unspecified: Secondary | ICD-10-CM | POA: Diagnosis not present

## 2021-02-18 DIAGNOSIS — E1122 Type 2 diabetes mellitus with diabetic chronic kidney disease: Secondary | ICD-10-CM | POA: Diagnosis not present

## 2021-02-18 DIAGNOSIS — J90 Pleural effusion, not elsewhere classified: Secondary | ICD-10-CM | POA: Diagnosis not present

## 2021-02-18 DIAGNOSIS — J9601 Acute respiratory failure with hypoxia: Secondary | ICD-10-CM | POA: Diagnosis not present

## 2021-02-18 DIAGNOSIS — I517 Cardiomegaly: Secondary | ICD-10-CM | POA: Diagnosis not present

## 2021-02-18 DIAGNOSIS — J9621 Acute and chronic respiratory failure with hypoxia: Secondary | ICD-10-CM | POA: Diagnosis not present

## 2021-02-18 DIAGNOSIS — N183 Chronic kidney disease, stage 3 unspecified: Secondary | ICD-10-CM | POA: Diagnosis not present

## 2021-02-18 DIAGNOSIS — R091 Pleurisy: Secondary | ICD-10-CM | POA: Diagnosis not present

## 2021-02-18 HISTORY — PX: IR THORACENTESIS ASP PLEURAL SPACE W/IMG GUIDE: IMG5380

## 2021-02-18 LAB — BASIC METABOLIC PANEL
Anion gap: 8 (ref 5–15)
BUN: 36 mg/dL — ABNORMAL HIGH (ref 8–23)
CO2: 35 mmol/L — ABNORMAL HIGH (ref 22–32)
Calcium: 8.6 mg/dL — ABNORMAL LOW (ref 8.9–10.3)
Chloride: 97 mmol/L — ABNORMAL LOW (ref 98–111)
Creatinine, Ser: 1.44 mg/dL — ABNORMAL HIGH (ref 0.61–1.24)
GFR, Estimated: 50 mL/min — ABNORMAL LOW (ref >60)
Glucose, Bld: 96 mg/dL (ref 70–99)
Potassium: 3.6 mmol/L (ref 3.5–5.1)
Sodium: 140 mmol/L (ref 135–145)

## 2021-02-18 LAB — BODY FLUID CELL COUNT WITH DIFFERENTIAL
Eos, Fluid: 1 %
Lymphs, Fluid: 58 %
Monocyte-Macrophage-Serous Fluid: 15 % — ABNORMAL LOW (ref 50–90)
Neutrophil Count, Fluid: 26 % — ABNORMAL HIGH (ref 0–25)
Total Nucleated Cell Count, Fluid: 4050 cu mm — ABNORMAL HIGH (ref 0–1000)

## 2021-02-18 LAB — CBC
HCT: 33.3 % — ABNORMAL LOW (ref 39.0–52.0)
Hemoglobin: 10.4 g/dL — ABNORMAL LOW (ref 13.0–17.0)
MCH: 33.1 pg (ref 26.0–34.0)
MCHC: 31.2 g/dL (ref 30.0–36.0)
MCV: 106.1 fL — ABNORMAL HIGH (ref 80.0–100.0)
Platelets: 116 10*3/uL — ABNORMAL LOW (ref 150–400)
RBC: 3.14 MIL/uL — ABNORMAL LOW (ref 4.22–5.81)
RDW: 18.1 % — ABNORMAL HIGH (ref 11.5–15.5)
WBC: 6.5 10*3/uL (ref 4.0–10.5)
nRBC: 0 % (ref 0.0–0.2)

## 2021-02-18 LAB — PROTEIN, PLEURAL OR PERITONEAL FLUID: Total protein, fluid: <3 g/dL

## 2021-02-18 MED ORDER — LIDOCAINE HCL 1 % IJ SOLN
INTRAMUSCULAR | Status: AC
  Filled 2021-02-18: qty 20

## 2021-02-18 MED ORDER — FUROSEMIDE 10 MG/ML IJ SOLN
20.0000 mg | Freq: Once | INTRAMUSCULAR | Status: AC
  Administered 2021-02-18: 20 mg via INTRAVENOUS
  Filled 2021-02-18: qty 2

## 2021-02-18 MED ORDER — LIDOCAINE HCL (PF) 1 % IJ SOLN
INTRAMUSCULAR | Status: DC | PRN
  Administered 2021-02-18: 30 mL

## 2021-02-18 NOTE — Progress Notes (Deleted)
History of Present Illness [name] is a 80 y.o. male with ***   02/18/2021  Test Results:  CBC Latest Ref Rng & Units 02/18/2021 02/17/2021 02/16/2021  WBC 4.0 - 10.5 K/uL 6.5 6.5 7.8  Hemoglobin 13.0 - 17.0 g/dL 10.4(L) 10.5(L) 11.0(L)  Hematocrit 39.0 - 52.0 % 33.3(L) 33.3(L) 34.3(L)  Platelets 150 - 400 K/uL 116(L) 97(L) 98(L)    BMP Latest Ref Rng & Units 02/18/2021 02/17/2021 02/16/2021  Glucose 70 - 99 mg/dL 96 101(H) 109(H)  BUN 8 - 23 mg/dL 36(H) 33(H) 35(H)  Creatinine 0.61 - 1.24 mg/dL 1.44(H) 1.40(H) 1.52(H)  BUN/Creat Ratio 6 - 22 (calc) - - -  Sodium 135 - 145 mmol/L 140 137 138  Potassium 3.5 - 5.1 mmol/L 3.6 3.6 3.6  Chloride 98 - 111 mmol/L 97(L) 96(L) 98  CO2 22 - 32 mmol/L 35(H) 31 34(H)  Calcium 8.9 - 10.3 mg/dL 8.6(L) 8.5(L) 8.6(L)    BNP    Component Value Date/Time   BNP 642.9 (H) 02/14/2021 1943    ProBNP    Component Value Date/Time   PROBNP 289.0 (H) 12/06/2014 0943    PFT    Component Value Date/Time   FEV1PRE 1.47 05/05/2015 0835   FEV1POST 1.50 05/05/2015 0835   FVCPRE 2.26 05/05/2015 0835   FVCPOST 2.17 05/05/2015 0835   TLC 4.81 05/05/2015 0835   DLCOUNC 20.21 05/05/2015 0835   PREFEV1FVCRT 65 05/05/2015 0835   PSTFEV1FVCRT 69 05/05/2015 0835    DG Chest 1 View  Result Date: 02/18/2021 CLINICAL DATA:  Status post thoracentesis EXAM: CHEST  1 VIEW COMPARISON:  Feb 17, 2021 chest radiograph and chest CT FINDINGS: No pneumothorax. Small residual pleural effusion on the right. No edema or airspace opacity. Stable cardiomegaly with pacemaker leads attached to right atrium and right ventricle. There is aortic atherosclerosis. Mitral annulus calcification noted. Status post aortic valve replacement. No adenopathy. Degenerative change in each shoulder noted. IMPRESSION: No pneumothorax. Small residual pleural effusion on the right. No edema or airspace opacity. Stable cardiac silhouette with postoperative changes. Aortic Atherosclerosis  (ICD10-I70.0). Electronically Signed   By: Lowella Grip III M.D.   On: 02/18/2021 10:48   CT CHEST WO CONTRAST  Result Date: 02/17/2021 CLINICAL DATA:  Pneumonia, worsening shortness of breath, no prove mint with the IV antibiotics EXAM: CT CHEST WITHOUT CONTRAST TECHNIQUE: Multidetector CT imaging of the chest was performed following the standard protocol without IV contrast. COMPARISON:  None. FINDINGS: Cardiovascular: Left chest multi lead pacer. Aortic atherosclerosis. Aortic valve stent endograft. Cardiomegaly. Dense mitral annular calcifications. Trace pericardial effusion. Mediastinum/Nodes: No enlarged mediastinal, hilar, or axillary lymph nodes. Thyroid gland, trachea, and esophagus demonstrate no significant findings. Lungs/Pleura: Moderate right, small left pleural effusions and associated atelectasis or consolidation. Mild interlobular septal thickening throughout the lungs. Upper Abdomen: No acute abnormality. Musculoskeletal: No chest wall mass or suspicious bone lesions identified. IMPRESSION: 1. Moderate right, small left pleural effusions and associated atelectasis or consolidation. 2. Mild interlobular septal thickening throughout the lungs, consistent with mild pulmonary edema. 3. Cardiomegaly and trace pericardial effusion. 4. Aortic valve stent endograft. Aortic Atherosclerosis (ICD10-I70.0). Electronically Signed   By: Eddie Candle M.D.   On: 02/17/2021 13:29   DG CHEST PORT 1 VIEW  Result Date: 02/17/2021 CLINICAL DATA:  Atrial fibrillation. Weakness and shortness of breath. EXAM: PORTABLE CHEST 1 VIEW COMPARISON:  02/14/2021 FINDINGS: A dual lead pacemaker remains in place. The cardiac silhouette remains mildly enlarged. Lung volumes remain low with similar appearance of  bibasilar airspace opacities but slight increase of mild right midlung opacity. A small right pleural effusion is not excluded. No pneumothorax is seen. IMPRESSION: Low lung volumes with unchanged bibasilar  airspace opacities and slightly increased right midlung opacity. Findings could reflect atelectasis or pneumonia. Electronically Signed   By: Logan Bores M.D.   On: 02/17/2021 10:22   DG Chest Portable 1 View  Result Date: 02/14/2021 CLINICAL DATA:  Weakness and shortness of breath EXAM: PORTABLE CHEST 1 VIEW COMPARISON:  02/12/2021 FINDINGS: Cardiac shadow is enlarged but stable. Pacing device is again seen. Slight increase in the degree of right basilar airspace opacity when compare with the prior study. No other focal abnormality is noted. IMPRESSION: Slight increase in the degree of right basilar airspace opacity when compared with the prior study. Electronically Signed   By: Inez Catalina M.D.   On: 02/14/2021 20:08   DG Chest Port 1 View  Result Date: 02/12/2021 CLINICAL DATA:  Question sepsis. EXAM: PORTABLE CHEST 1 VIEW COMPARISON:  05/08/2020 FINDINGS: Cardiac enlargement. Dual lead pacemaker. No significant edema. TAVR. Right lower lobe airspace density is unchanged from the prior study and likely is atelectasis or scarring. Possible small right pleural effusion. Negative for edema or pneumonia IMPRESSION: Mild right lower lobe airspace density unchanged from the prior study. No superimposed acute abnormality. Electronically Signed   By: Franchot Gallo M.D.   On: 02/12/2021 13:25   ECHOCARDIOGRAM COMPLETE  Result Date: 02/13/2021    ECHOCARDIOGRAM REPORT   Patient Name:   [name] Date of Exam: 02/13/2021 Medical Rec #:  [id number]     Height:       74.0 in Accession #:    [id number]    Weight:       389.1 lb Date of Birth:  02-20-1941    BSA:          2.884 m Patient Age:    38 years      BP:           126/58 mmHg Patient Gender: M             HR:           56 bpm. Exam Location:  Inpatient Procedure: 2D Echo, Cardiac Doppler and Color Doppler Indications:    Respiratory distress  History:        Patient has prior history of Echocardiogram examinations, most                 recent  05/09/2020. Risk Factors:Dyslipidemia and Hypertension.  Sonographer:    Cammy Brochure Referring Phys: [id number] [place]  1. Severe calcific MS is present. MG 16 mmHG @ 77 bpm. MVA 1.02 cm2, DI 0.35. The mitral valve is degenerative. No evidence of mitral valve regurgitation. Severe mitral stenosis. Severe mitral annular calcification.  2. Left ventricular ejection fraction, by estimation, is >75%. The left ventricle has hyperdynamic function. The left ventricle has no regional wall motion abnormalities. There is moderate concentric left ventricular hypertrophy. Left ventricular diastolic function could not be evaluated. There is the interventricular septum is flattened in systole and diastole, consistent with right ventricular pressure and volume overload.  3. Right ventricular systolic function is mildly reduced. The right ventricular size is moderately enlarged. There is mildly elevated pulmonary artery systolic pressure. The estimated right ventricular systolic pressure is 99.8 mmHg.  4. Left atrial size was moderately dilated.  5. Right atrial size was mildly dilated.  6. 26 mm S3. Vmax  2.2 m/s, MG 10 mmHG, EOA 2.0 cm2. No regurgitation. Normal prosthesis . The aortic valve has been repaired/replaced. Aortic valve regurgitation is not visualized. Procedure Date: 2016. Echo findings are consistent with normal structure  and function of the aortic valve prosthesis.  7. The inferior vena cava is normal in size with greater than 50% respiratory variability, suggesting right atrial pressure of 3 mmHg. Comparison(s): No significant change from prior study. FINDINGS  Left Ventricle: Left ventricular ejection fraction, by estimation, is >75%. The left ventricle has hyperdynamic function. The left ventricle has no regional wall motion abnormalities. The left ventricular internal cavity size was normal in size. There is moderate concentric left ventricular hypertrophy. The interventricular septum is  flattened in systole and diastole, consistent with right ventricular pressure and volume overload. Left ventricular diastolic function could not be evaluated due to mitral annular calcification (moderate or greater). Left ventricular diastolic function could not be evaluated. Right Ventricle: The right ventricular size is moderately enlarged. No increase in right ventricular wall thickness. Right ventricular systolic function is mildly reduced. There is mildly elevated pulmonary artery systolic pressure. The tricuspid regurgitant velocity is 2.90 m/s, and with an assumed right atrial pressure of 3 mmHg, the estimated right ventricular systolic pressure is 60.6 mmHg. Left Atrium: Left atrial size was moderately dilated. Right Atrium: Right atrial size was mildly dilated. Pericardium: Trivial pericardial effusion is present. Mitral Valve: Severe calcific MS is present. MG 16 mmHG @ 77 bpm. MVA 1.02 cm2, DI 0.35. The mitral valve is degenerative in appearance. Severe mitral annular calcification. No evidence of mitral valve regurgitation. Severe mitral valve stenosis. MV peak  gradient, 34.3 mmHg. The mean mitral valve gradient is 16.0 mmHg with average heart rate of 77 bpm. Tricuspid Valve: The tricuspid valve is grossly normal. Tricuspid valve regurgitation is mild . No evidence of tricuspid stenosis. Aortic Valve: 26 mm S3. Vmax 2.2 m/s, MG 10 mmHG, EOA 2.0 cm2. No regurgitation. Normal prosthesis. The aortic valve has been repaired/replaced. Aortic valve regurgitation is not visualized. Aortic valve mean gradient measures 10.0 mmHg. Aortic valve peak gradient measures 19.4 mmHg. Aortic valve area, by VTI measures 2.00 cm. There is a 26 mm Sapien prosthetic, stented (TAVR) valve present in the aortic position. Echo findings are consistent with normal structure and function of the aortic valve prosthesis. Pulmonic Valve: The pulmonic valve was grossly normal. Pulmonic valve regurgitation is not visualized. No  evidence of pulmonic stenosis. Aorta: The aortic root is normal in size and structure. Venous: The inferior vena cava is normal in size with greater than 50% respiratory variability, suggesting right atrial pressure of 3 mmHg. IAS/Shunts: The atrial septum is grossly normal. Additional Comments: A device lead is visualized in the right atrium and right ventricle.  LEFT VENTRICLE PLAX 2D LVIDd:         5.40 cm  Diastology LVIDs:         3.40 cm  LV e' lateral: 4.68 cm/s LV PW:         1.50 cm LV IVS:        1.50 cm LVOT diam:     1.90 cm LV SV:         84 LV SV Index:   29 LVOT Area:     2.84 cm  RIGHT VENTRICLE             IVC RV Basal diam:  5.30 cm     IVC diam: 2.10 cm RV S prime:     13.50  cm/s TAPSE (M-mode): 1.9 cm LEFT ATRIUM              Index       RIGHT ATRIUM           Index LA diam:        5.00 cm  1.73 cm/m  RA Area:     26.10 cm LA Vol (A2C):   129.0 ml 44.73 ml/m RA Volume:   86.30 ml  29.92 ml/m LA Vol (A4C):   132.0 ml 45.77 ml/m LA Biplane Vol: 133.0 ml 46.12 ml/m  AORTIC VALVE AV Area (Vmax):    2.23 cm AV Area (Vmean):   1.99 cm AV Area (VTI):     2.00 cm AV Vmax:           220.00 cm/s AV Vmean:          147.000 cm/s AV VTI:            0.420 m AV Peak Grad:      19.4 mmHg AV Mean Grad:      10.0 mmHg LVOT Vmax:         173.00 cm/s LVOT Vmean:        103.000 cm/s LVOT VTI:          0.296 m LVOT/AV VTI ratio: 0.70  AORTA Ao Root diam: 2.60 cm MITRAL VALVE             TRICUSPID VALVE MV Area VTI:  1.02 cm   TR Peak grad:   33.6 mmHg MV Peak grad: 34.3 mmHg  TR Vmax:        290.00 cm/s MV Mean grad: 16.0 mmHg MV Vmax:      2.93 m/s   SHUNTS MV Vmean:     184.0 cm/s Systemic VTI:  0.30 m MV VTI:       0.82 m     Systemic Diam: 1.90 cm Eleonore Chiquito MD Electronically signed by Eleonore Chiquito MD Signature Date/Time: 02/13/2021/3:23:21 PM    Final    CUP PACEART REMOTE DEVICE CHECK  Result Date: 02/03/2021 Scheduled remote reviewed. Normal device function.  Next remote 91 days- JBox,  RN/CVRS    Past medical hx Past Medical History:  Diagnosis Date  . 1st degree AV block 11/09/2018   Noted on EKG   . Anemia   . Aortic stenosis, severe    S/p Edwards Sapien 3 Transcatheter Heart Valve (size 26 mm, model # [id number], serial # [id number])  . Arthritis   . Back pain   . Bursitis   . Cataract    left immature  . Cellulitis 10/12/2015  . CHF (congestive heart failure) ([place])   . Complication of anesthesia    Halucinations  . Constipation    takes Miralax daily as well as Senokot daily  . DDD (degenerative disc disease)   . Gout    takes Allopurinol daily  . Heart valve disorder   . History of blood clots 1962   knee  . History of blood transfusion    no abnormal reaction noted  . History of shingles   . Hyperlipidemia    takes Atorvastatin daily  . Hypertension    takes Lisinopril daily  . Low back pain 01/25/2017  . Morbid obesity ([place])   . OSA (obstructive sleep apnea)   . Osteoarthritis   . Peripheral neuropathy    takes Gabapentin daily  . Peroneal palsy    significant right foot drop  . Persistent atrial fibrillation ([place])   .  Pneumonia 25+yrs ago   hx of  . Prostate cancer ([place]) 02/13/2019  . RBBB 11/09/2018   Noted on EKG  . Thrombocytopenia ([place])   . Urinary frequency   . Urinary urgency   . Valvular heart disease   . Venous stasis dermatitis      Social History   Tobacco Use  . Smoking status: Former Smoker    Years: 30.00    Types: Pipe    Quit date: 09/20/2006    Years since quitting: 14.4  . Smokeless tobacco: Never Used  Vaping Use  . Vaping Use: Never used  Substance Use Topics  . Alcohol use: No    Alcohol/week: 0.0 standard drinks  . Drug use: No    Mr.Pound reports that he quit smoking about 14 years ago. His smoking use included pipe. He quit after 30.00 years of use. He has never used smokeless tobacco. He reports that he does not drink alcohol and does not use drugs.  Tobacco Cessation: Counseling given: Not  Answered   Past surgical hx, Family hx, Social hx all reviewed.  Current Facility-Administered Medications on File Prior to Visit  Medication  . acetaminophen (TYLENOL) tablet 1,000 mg   Or  . acetaminophen (TYLENOL) suppository 650 mg  . albuterol (PROVENTIL) (2.5 MG/3ML) 0.083% nebulizer solution 2.5 mg  . allopurinol (ZYLOPRIM) tablet 100 mg  . apixaban (ELIQUIS) tablet 5 mg  . ascorbic acid (VITAMIN C) tablet 500 mg  . atorvastatin (LIPITOR) tablet 80 mg  . ceFEPIme (MAXIPIME) 2 g in sodium chloride 0.9 % 100 mL IVPB  . famotidine (PEPCID) tablet 40 mg  . fluticasone furoate-vilanterol (BREO ELLIPTA) 100-25 MCG/INH 1 puff   And  . umeclidinium bromide (INCRUSE ELLIPTA) 62.5 MCG/INH 1 puff  . gabapentin (NEURONTIN) capsule 300 mg  . lidocaine (PF) (XYLOCAINE) 1 % injection  . polyethylene glycol (MIRALAX / GLYCOLAX) packet 17 g   Current Outpatient Medications on File Prior to Visit  Medication Sig  . acetaminophen (TYLENOL) 500 MG tablet Take 2,000 mg by mouth 2 (two) times daily.  [name] albuterol (VENTOLIN HFA) 108 (90 Base) MCG/ACT inhaler TAKE 2 PUFFS BY MOUTH EVERY 6 HOURS AS NEEDED (Patient taking differently: Inhale 2 puffs into the lungs every 6 (six) hours as needed for wheezing or shortness of breath.)  . allopurinol (ZYLOPRIM) 100 MG tablet Take 1 tablet (100 mg total) by mouth daily.  [name] atorvastatin (LIPITOR) 80 MG tablet 1 tablet by mouth daily (Patient taking differently: Take 80 mg by mouth daily.)  . cetirizine (ZYRTEC) 10 MG tablet Take 1 tablet (10 mg total) by mouth daily. (Patient not taking: Reported on 02/13/2021)  . Cholecalciferol (VITAMIN D3) 125 MCG (5000 UT) CAPS Take 1,000 Units by mouth daily.  . Coenzyme Q10 (COQ10) 100 MG CAPS Take 100 mg by mouth daily.  [name] ELIQUIS 5 MG TABS tablet TAKE 1 TABLET BY MOUTH TWICE A DAY (Patient taking differently: Take 5 mg by mouth 2 (two) times daily.)  . famotidine (PEPCID) 40 MG tablet TAKE 1 TABLET BY MOUTH EVERY DAY  (Patient taking differently: Take 40 mg by mouth daily.)  . Ferrous Fumarate-Folic Acid (HEMATINIC/FOLIC ACID) 924-2 MG TABS Take 1 tablet by mouth daily. (Patient not taking: Reported on 02/13/2021)  . Fluticasone-Umeclidin-Vilant (TRELEGY ELLIPTA) 100-62.5-25 MCG/INH AEPB Inhale 1 puff into the lungs daily.  . furosemide (LASIX) 20 MG tablet Take 1 tablet (20 mg total) by mouth 2 (two) times daily.  [name] gabapentin (NEURONTIN) 300 MG capsule Take 1  capsule (300 mg total) by mouth 3 (three) times daily. (Patient taking differently: Take 300-600 mg by mouth 2 (two) times daily.)  . Misc Natural Products (TART CHERRY ADVANCED PO) Take 1,200 mg by mouth 2 (two) times a day.   . Multiple Vitamins-Minerals (CENTRUM SILVER PO) Take 1 tablet by mouth daily.  . Phenazopyridine HCl (AZO-STANDARD PO) Take 1 tablet by mouth 2 (two) times daily.  . polyethylene glycol (MIRALAX / GLYCOLAX) packet Take 17 g by mouth daily.  . vitamin C (ASCORBIC ACID) 500 MG tablet Take 500 mg by mouth daily.     Allergies  Allergen Reactions  . Sulfa Antibiotics     Other reaction(s): Unknown  . Coumadin [Warfarin Sodium] Other (See Comments)    States he can't be on this-bleeds out    Review Of Systems:  Constitutional:   No  weight loss, night sweats,  Fevers, chills, fatigue, or  lassitude.  HEENT:   No headaches,  Difficulty swallowing,  Tooth/dental problems, or  Sore throat,                No sneezing, itching, ear ache, nasal congestion, post nasal drip,   CV:  No chest pain,  Orthopnea, PND, swelling in lower extremities, anasarca, dizziness, palpitations, syncope.   GI  No heartburn, indigestion, abdominal pain, nausea, vomiting, diarrhea, change in bowel habits, loss of appetite, bloody stools.   Resp: No shortness of breath with exertion or at rest.  No excess mucus, no productive cough,  No non-productive cough,  No coughing up of blood.  No change in color of mucus.  No wheezing.  No chest wall  deformity  Skin: no rash or lesions.  GU: no dysuria, change in color of urine, no urgency or frequency.  No flank pain, no hematuria   MS:  No joint pain or swelling.  No decreased range of motion.  No back pain.  Psych:  No change in mood or affect. No depression or anxiety.  No memory loss.   Vital Signs There were no vitals taken for this visit.   Physical Exam:  General- No distress,  A&Ox3 ENT: No sinus tenderness, TM clear, pale nasal mucosa, no oral exudate,no post nasal drip, no LAN Cardiac: S1, S2, regular rate and rhythm, no murmur Chest: No wheeze/ rales/ dullness; no accessory muscle use, no nasal flaring, no sternal retractions Abd.: Soft Non-tender Ext: No clubbing cyanosis, edema Neuro:  normal strength Skin: No rashes, warm and dry Psych: normal mood and behavior   Assessment/Plan  No problem-specific Assessment & Plan notes found for this encounter.    Magdalen Spatz, NP 02/18/2021  1:42 PM

## 2021-02-18 NOTE — Procedures (Signed)
PROCEDURE SUMMARY:  Successful US guided right thoracentesis. Yielded 700 mL of serosanguibous fluid. Pt tolerated procedure well. No immediate complications.  Specimen was sent for labs. CXR ordered.  EBL < 5 mL  Ascencion Dike PA-C 02/18/2021 10:30 AM

## 2021-02-18 NOTE — Discharge Instructions (Signed)

## 2021-02-18 NOTE — Consult Note (Signed)
   [facility] Inpatient Consult   02/18/2021  [place] 10/26/1940 [id number]   Patient chart has been reviewed for readmissions less than 30 days and for high risk score for unplanned readmissions.  Patient assessed for community [place] Management follow up needs.   Per chart review, current recommendation is for SNF. No [facility] CM follow up at this time.  Of note, [facility] Care Management services does not replace or interfere with any services that are arranged by inpatient case management or social work.  Netta Cedars, MSN, [facility] Liaison Nurse Mobile Phone [phone]  Toll free office [phone]

## 2021-02-18 NOTE — Progress Notes (Signed)
PROGRESS NOTE    [name]  UJW:[id number] DOB: May 08, 1941 DOA: 02/14/2021 PCP: [name], MD    Chief Complaint  Patient presents with  . Weakness  . Shortness of Breath    Brief Narrative:  [name] is a 80 year old male with past medical history significant for paroxysmal atrial fibrillation, aortic stenosis s/p TAVR, chronic diastolic congestive heart failure, degenerative disc disease, gout, hyperlipidemia, hypertension, morbid obesity, OSA/hypoventilation syndrome, OA, CKD3a, peripheral neuropathy, CAD, history of prostate cancer, venous stasis dermatitis who presented to [facility] ED on 5/28 via EMS for weakness and dyspnea.  Patient reports was unable to get off the toilet with associated shortness of breath and generalized weakness.  Patient recently hospitalized at [facility] long hospital from  5/26 through 5/28 for decompensated OSA/OHS.  During that hospitalization, his BiPAP settings were optimized with a new facemask.  Patient also reports during the hospitalization he had very little mobility which he believes caused deconditioning when he returned home.  Pt seen and examined at bedside.   Assessment & Plan:   Principal Problem:   Acute respiratory failure with hypoxia (HCC) Active Problems:   HYPERCHOLESTEROLEMIA   Obesity, Class III, BMI 40-49.9 (morbid obesity) (HCC)   Macrocytic anemia   Obstructive sleep apnea   COPD mixed type (HCC)   Essential hypertension   CKD stage 3 secondary to diabetes (HCC)   Obesity hypoventilation syndrome (HCC)   Acute on chronic respiratory failure with hypoxemia (HCC)   Acute on chronic hypoxic respiratory failure present on admission possibly combination of acute on chronic diastolic heart failure and pneumonia. Chest x-ray showing right basilar airspace opacity on admission, mildly elevated WBC count. He was started on vancomycin and cefepime and currently requiring up to 3 L of nasal cannula oxygen to  keep sats greater than 90% CT chest without contrast ordered,  Moderate right, small left pleural effusions and associated atelectasis or consolidation. Mild interlobular septal thickening throughout the lungs, consistent with mild pulmonary edema.  IR consulted, for thoracentesis, about 700 ml of sero sanguinous fluid taken out and sent for analysis.     Stage IIIa CKD Creatinine slightly up from baseline 1.3.  Continue to monitor. Creatinine stable around 1.4.    COPD Continue bronchodilators as needed. No wheezing heard.  On 3 L of oxygen at night at home.   Morbidly obese Recommend outpatient follow-up with PCP patient may benefit from bariatric evaluation as outpatient. Body mass index is 48.97 kg/m.     Obstructive sleep apnea/obesity hypoventilation syndrome Continue with CPAP at night and during the day as needed.   Mild acute on chronic diastolic heart failure Restarted the diuretics.  Continue strict intake and output and daily weights. Plan to wean his oxygen.     hyperlipidemia:  Resume statin.    Anemia of chronic disease:  Hemoglobin is stable around 10.   DVT prophylaxis: Eliquis Code Status: Full code full code Family Communication: none at bedside.  Disposition:   Status is: Inpatient  Remains inpatient appropriate because:IV treatments appropriate due to intensity of illness or inability to take PO   Dispo:  Patient From: Home  Planned Disposition: [place]  Medically stable for discharge: No         Consultants:   None.    Procedures: CT chest without contrast   Antimicrobials: cefepime    Subjective: Sob has improved. No chest pain no nausea, or vomiting.  Objective: Vitals:   02/18/21 0334 02/18/21 0503 02/18/21  0809 02/18/21 1046  BP:  126/64  (!) (P) 122/57  Pulse:  70  67  Resp:  20  (P) 18  Temp:  98.4 F (36.9 C)  (P) 97.9 F (36.6 C)  TempSrc:  Oral  (P) Oral  SpO2: 90% 93% 93% 98%  Weight:       Height:        Intake/Output Summary (Last 24 hours) at 02/18/2021 1415 Last data filed at 02/18/2021 1217 Gross per 24 hour  Intake 260 ml  Output 950 ml  Net -690 ml   Filed Weights   02/14/21 1927 02/15/21 0500  Weight: (!) 171.5 kg (!) 173 kg    Examination:  General exam: alert and comfortable.  Respiratory system: diminished air entry at bases on the right > left. On  oxygen.   Cardiovascular system: S1 & S2 heard, RRR, no JVD, pedal edema present.  Gastrointestinal system: Abdomen is soft, NT ND BS+ Central nervous system: Alert And oriented, nonfocal Extremities: no cyanosis.  Skin: No rashes seen. Psychiatry: Mood is appropriate.     Data Reviewed: I have personally reviewed following labs and imaging studies  CBC: Recent Labs  Lab 02/12/21 1220 02/12/21 1300 02/14/21 1935 02/14/21 2120 02/15/21 0858 02/16/21 0149 02/17/21 0312 02/18/21 0239  WBC 8.3   < > 10.6*  --  7.7 7.8 6.5 6.5  NEUTROABS 6.1  --   --   --   --   --   --   --   HGB 12.1*   < > 12.2* 12.6* 11.3* 11.0* 10.5* 10.4*  HCT 39.2   < > 38.4* 37.0* 36.0* 34.3* 33.3* 33.3*  MCV 107.4*   < > 106.4*  --  106.5* 105.9* 105.4* 106.1*  PLT 148*   < > 131*  --  100* 98* 97* 116*   < > = values in this interval not displayed.    Basic Metabolic Panel: Recent Labs  Lab 02/13/21 0255 02/14/21 0127 02/14/21 1935 02/14/21 2120 02/15/21 0858 02/16/21 0149 02/17/21 0312 02/18/21 0239  NA 143   < > 137 137 138 138 137 140  K 4.4   < > 3.9 4.0 4.0 3.6 3.6 3.6  CL 99   < > 94*  --  98 98 96* 97*  CO2 36*   < > 36*  --  33* 34* 31 35*  GLUCOSE 88   < > 127*  --  94 109* 101* 96  BUN 34*   < > 32*  --  35* 35* 33* 36*  CREATININE 1.31*   < > 1.67*  --  1.65* 1.52* 1.40* 1.44*  CALCIUM 9.2   < > 8.9  --  8.5* 8.6* 8.5* 8.6*  MG 2.0  --   --   --   --  2.1 2.2  --   PHOS 4.2  --   --   --   --   --   --   --    < > = values in this interval not displayed.    GFR: Estimated Creatinine  Clearance: 70.9 mL/min (A) (by C-G formula based on SCr of 1.44 mg/dL (H)).  Liver Function Tests: Recent Labs  Lab 02/12/21 1220 02/15/21 0858  AST 23 27  ALT 21 19  ALKPHOS 65 48  BILITOT 0.7 1.3*  PROT 6.3* 5.3*  ALBUMIN 4.0 2.8*    CBG: No results for input(s): GLUCAP in the last 168 hours.   Recent Results (from the past  240 hour(s))  Blood culture (routine single)     Status: None   Collection Time: 02/12/21 12:44 PM   Specimen: Left Antecubital; Blood  Result Value Ref Range Status   Specimen Description   Final    LEFT ANTECUBITAL Performed at [facility], [address] [place], [place] [address]    Special Requests   Final    Blood Culture adequate volume Performed at [facility], [address], [place], [place] [address]    Culture   Final    NO GROWTH 5 DAYS Performed at [facility], [place] [address], [place], [place] [address]    Report Status 02/17/2021 FINAL  Final  Urine culture     Status: Abnormal   Collection Time: 02/12/21 12:44 PM   Specimen: In/Out Cath Urine  Result Value Ref Range Status   Specimen Description   Final    IN/OUT CATH URINE Performed at [facility], [address], [place], [place] [address]    Special Requests   Final    NONE Performed at [facility], [address] [place], [place] [address]    Culture (A)  Final    10,000 COLONIES/mL AEROCOCCUS URINAE Standardized susceptibility testing for this organism is not available. Performed at [facility], [place] [address], [place], [place] [address]    Report Status 02/16/2021 FINAL  Final  Resp Panel by RT-PCR (Flu A&B, Covid)     Status: None   Collection Time: 02/12/21 12:54 PM   Specimen: Nasopharyngeal(NP) swabs in vial transport medium  Result Value Ref Range Status   SARS Coronavirus 2 by RT PCR NEGATIVE NEGATIVE Final    Comment: (NOTE) SARS-CoV-2 target nucleic acids  are NOT DETECTED.  The SARS-CoV-2 RNA is generally detectable in upper respiratory specimens during the acute phase of infection. The lowest concentration of SARS-CoV-2 viral copies this assay can detect is 138 copies/mL. A negative result does not preclude SARS-Cov-2 infection and should not be used as the sole basis for treatment or other patient management decisions. A negative result may occur with  improper specimen collection/handling, submission of specimen other than nasopharyngeal swab, presence of viral mutation(s) within the areas targeted by this assay, and inadequate number of viral copies(<138 copies/mL). A negative result must be combined with clinical observations, patient history, and epidemiological information. The expected result is Negative.  Fact Sheet for Patients:  EntrepreneurPulse.com.au  Fact Sheet for Healthcare Providers:  IncredibleEmployment.be  This test is no t yet approved or cleared by the [place] FDA and  has been authorized for detection and/or diagnosis of SARS-CoV-2 by FDA under an Emergency Use Authorization (EUA). This EUA will remain  in effect (meaning this test can be used) for the duration of the COVID-19 declaration under Section 564(b)(1) of the Act, 21 U.S.C.section 360bbb-3(b)(1), unless the authorization is terminated  or revoked sooner.       Influenza A by PCR NEGATIVE NEGATIVE Final   Influenza B by PCR NEGATIVE NEGATIVE Final    Comment: (NOTE) The Xpert Xpress SARS-CoV-2/FLU/RSV plus assay is intended as an aid in the diagnosis of influenza from Nasopharyngeal swab specimens and should not be used as a sole basis for treatment. Nasal washings and aspirates are unacceptable for Xpert Xpress SARS-CoV-2/FLU/RSV testing.  Fact Sheet for Patients: EntrepreneurPulse.com.au  Fact Sheet for Healthcare Providers: IncredibleEmployment.be  This test is  not yet approved or cleared by the [place] FDA and has been authorized for detection  and/or diagnosis of SARS-CoV-2 by FDA under an Emergency Use Authorization (EUA). This EUA will remain in effect (meaning this test can be used) for the duration of the COVID-19 declaration under Section 564(b)(1) of the Act, 21 U.S.C. section 360bbb-3(b)(1), unless the authorization is terminated or revoked.  Performed at [facility], [address], [place], [place] [address]   MRSA PCR Screening     Status: None   Collection Time: 02/12/21  5:55 PM   Specimen: Nasopharyngeal  Result Value Ref Range Status   MRSA by PCR NEGATIVE NEGATIVE Final    Comment:        The GeneXpert MRSA Assay (FDA approved for NASAL specimens only), is one component of a comprehensive MRSA colonization surveillance program. It is not intended to diagnose MRSA infection nor to guide or monitor treatment for MRSA infections. Performed at [facility], [place] [address], [place], [place] [address]   Resp Panel by RT-PCR (Flu A&B, Covid) Nasopharyngeal Swab     Status: None   Collection Time: 02/14/21  7:40 PM   Specimen: Nasopharyngeal Swab; Nasopharyngeal(NP) swabs in vial transport medium  Result Value Ref Range Status   SARS Coronavirus 2 by RT PCR NEGATIVE NEGATIVE Final    Comment: (NOTE) SARS-CoV-2 target nucleic acids are NOT DETECTED.  The SARS-CoV-2 RNA is generally detectable in upper respiratory specimens during the acute phase of infection. The lowest concentration of SARS-CoV-2 viral copies this assay can detect is 138 copies/mL. A negative result does not preclude SARS-Cov-2 infection and should not be used as the sole basis for treatment or other patient management decisions. A negative result may occur with  improper specimen collection/handling, submission of specimen other than nasopharyngeal swab, presence of viral mutation(s) within the areas targeted  by this assay, and inadequate number of viral copies(<138 copies/mL). A negative result must be combined with clinical observations, patient history, and epidemiological information. The expected result is Negative.  Fact Sheet for Patients:  EntrepreneurPulse.com.au  Fact Sheet for Healthcare Providers:  IncredibleEmployment.be  This test is no t yet approved or cleared by the [place] FDA and  has been authorized for detection and/or diagnosis of SARS-CoV-2 by FDA under an Emergency Use Authorization (EUA). This EUA will remain  in effect (meaning this test can be used) for the duration of the COVID-19 declaration under Section 564(b)(1) of the Act, 21 U.S.C.section 360bbb-3(b)(1), unless the authorization is terminated  or revoked sooner.       Influenza A by PCR NEGATIVE NEGATIVE Final   Influenza B by PCR NEGATIVE NEGATIVE Final    Comment: (NOTE) The Xpert Xpress SARS-CoV-2/FLU/RSV plus assay is intended as an aid in the diagnosis of influenza from Nasopharyngeal swab specimens and should not be used as a sole basis for treatment. Nasal washings and aspirates are unacceptable for Xpert Xpress SARS-CoV-2/FLU/RSV testing.  Fact Sheet for Patients: EntrepreneurPulse.com.au  Fact Sheet for Healthcare Providers: IncredibleEmployment.be  This test is not yet approved or cleared by the [place] FDA and has been authorized for detection and/or diagnosis of SARS-CoV-2 by FDA under an Emergency Use Authorization (EUA). This EUA will remain in effect (meaning this test can be used) for the duration of the COVID-19 declaration under Section 564(b)(1) of the Act, 21 U.S.C. section 360bbb-3(b)(1), unless the authorization is terminated or revoked.  Performed at [facility], [place] [address] [place], [place] [address]   Blood culture (routine x 2)     Status: None (Preliminary result)  Collection Time: 02/14/21  7:40 PM   Specimen: BLOOD LEFT WRIST  Result Value Ref Range Status   Specimen Description BLOOD LEFT WRIST  Final   Special Requests   Final    BOTTLES DRAWN AEROBIC AND ANAEROBIC Blood Culture adequate volume   Culture   Final    NO GROWTH 4 DAYS Performed at [facility], [address], [place], [place] [address]    Report Status PENDING  Incomplete  Blood culture (routine x 2)     Status: None (Preliminary result)   Collection Time: 02/15/21  1:20 AM   Specimen: BLOOD  Result Value Ref Range Status   Specimen Description BLOOD SITE NOT SPECIFIED  Final   Special Requests   Final    BOTTLES DRAWN AEROBIC AND ANAEROBIC Blood Culture adequate volume   Culture   Final    NO GROWTH 3 DAYS Performed at [facility], [address] [place], [place] [address]    Report Status PENDING  Incomplete  MRSA PCR Screening     Status: None   Collection Time: 02/15/21  8:36 AM   Specimen: Nasopharyngeal  Result Value Ref Range Status   MRSA by PCR NEGATIVE NEGATIVE Final    Comment:        The GeneXpert MRSA Assay (FDA approved for NASAL specimens only), is one component of a comprehensive MRSA colonization surveillance program. It is not intended to diagnose MRSA infection nor to guide or monitor treatment for MRSA infections. Performed at [facility], [place] [address], [place],  [address]          Radiology Studies: DG Chest 1 View  Result Date: 02/18/2021 CLINICAL DATA:  Status post thoracentesis EXAM: CHEST  1 VIEW COMPARISON:  Feb 17, 2021 chest radiograph and chest CT FINDINGS: No pneumothorax. Small residual pleural effusion on the right. No edema or airspace opacity. Stable cardiomegaly with pacemaker leads attached to right atrium and right ventricle. There is aortic atherosclerosis. Mitral annulus calcification noted. Status post aortic valve replacement. No adenopathy. Degenerative change in each shoulder noted.  IMPRESSION: No pneumothorax. Small residual pleural effusion on the right. No edema or airspace opacity. Stable cardiac silhouette with postoperative changes. Aortic Atherosclerosis (ICD10-I70.0). Electronically Signed   By: Lowella Grip III M.D.   On: 02/18/2021 10:48   CT CHEST WO CONTRAST  Result Date: 02/17/2021 CLINICAL DATA:  Pneumonia, worsening shortness of breath, no prove mint with the IV antibiotics EXAM: CT CHEST WITHOUT CONTRAST TECHNIQUE: Multidetector CT imaging of the chest was performed following the standard protocol without IV contrast. COMPARISON:  None. FINDINGS: Cardiovascular: Left chest multi lead pacer. Aortic atherosclerosis. Aortic valve stent endograft. Cardiomegaly. Dense mitral annular calcifications. Trace pericardial effusion. Mediastinum/Nodes: No enlarged mediastinal, hilar, or axillary lymph nodes. Thyroid gland, trachea, and esophagus demonstrate no significant findings. Lungs/Pleura: Moderate right, small left pleural effusions and associated atelectasis or consolidation. Mild interlobular septal thickening throughout the lungs. Upper Abdomen: No acute abnormality. Musculoskeletal: No chest wall mass or suspicious bone lesions identified. IMPRESSION: 1. Moderate right, small left pleural effusions and associated atelectasis or consolidation. 2. Mild interlobular septal thickening throughout the lungs, consistent with mild pulmonary edema. 3. Cardiomegaly and trace pericardial effusion. 4. Aortic valve stent endograft. Aortic Atherosclerosis (ICD10-I70.0). Electronically Signed   By: Eddie Candle M.D.   On: 02/17/2021 13:29   DG CHEST PORT 1 VIEW  Result Date: 02/17/2021 CLINICAL DATA:  Atrial fibrillation. Weakness and shortness of breath. EXAM: PORTABLE CHEST  1 VIEW COMPARISON:  02/14/2021 FINDINGS: A dual lead pacemaker remains in place. The cardiac silhouette remains mildly enlarged. Lung volumes remain low with similar appearance of bibasilar airspace opacities  but slight increase of mild right midlung opacity. A small right pleural effusion is not excluded. No pneumothorax is seen. IMPRESSION: Low lung volumes with unchanged bibasilar airspace opacities and slightly increased right midlung opacity. Findings could reflect atelectasis or pneumonia. Electronically Signed   By: Logan Bores M.D.   On: 02/17/2021 10:22        Scheduled Meds: . allopurinol  100 mg Oral Daily  . apixaban  5 mg Oral BID  . vitamin C  500 mg Oral Daily  . atorvastatin  80 mg Oral Daily  . famotidine  40 mg Oral Daily  . fluticasone furoate-vilanterol  1 puff Inhalation Daily   And  . umeclidinium bromide  1 puff Inhalation Daily  . gabapentin  300 mg Oral BID  . polyethylene glycol  17 g Oral Daily   Continuous Infusions: . ceFEPime (MAXIPIME) IV 2 g (02/18/21 0813)     LOS: 3 days        Hosie Poisson, MD [facility]   To contact the attending provider between 7A-7P or the covering provider during after hours 7P-7A, please log into the web site [email or web address] and access using universal [place] password for that web site. If you do not have the password, please call the hospital operator.  02/18/2021, 2:15 PM

## 2021-02-18 NOTE — TOC Progression Note (Signed)
Transition of Care [facility]) - Progression Note    Patient Details  Name: [name] MRN: [id number] Date of Birth: May 10, 1941  Transition of Care [facility]) CM/SW [place], [place] Phone Number: 02/18/2021, 1:00 PM  Clinical Narrative:     Patients insurance still pending for SNF placement.MD informed.Patient has SN[facility] bed at [facility]. CSW will continue to follow and assist with discharge planning needs.    Expected Discharge Plan: [place] Barriers to Discharge: Continued Medical Work up  Expected Discharge Plan and Services Expected Discharge Plan: [place] In-house Referral: Clinical Social Work     Living arrangements for the past 2 months: Single Family Home                                       Social Determinants of Health (SDOH) Interventions    Readmission Risk Interventions No flowsheet data found.

## 2021-02-19 DIAGNOSIS — J9601 Acute respiratory failure with hypoxia: Secondary | ICD-10-CM | POA: Diagnosis not present

## 2021-02-19 DIAGNOSIS — G4733 Obstructive sleep apnea (adult) (pediatric): Secondary | ICD-10-CM | POA: Diagnosis not present

## 2021-02-19 DIAGNOSIS — N183 Chronic kidney disease, stage 3 unspecified: Secondary | ICD-10-CM | POA: Diagnosis not present

## 2021-02-19 DIAGNOSIS — J449 Chronic obstructive pulmonary disease, unspecified: Secondary | ICD-10-CM | POA: Diagnosis not present

## 2021-02-19 DIAGNOSIS — J9621 Acute and chronic respiratory failure with hypoxia: Secondary | ICD-10-CM | POA: Diagnosis not present

## 2021-02-19 DIAGNOSIS — J9622 Acute and chronic respiratory failure with hypercapnia: Secondary | ICD-10-CM | POA: Diagnosis not present

## 2021-02-19 DIAGNOSIS — R062 Wheezing: Secondary | ICD-10-CM | POA: Diagnosis not present

## 2021-02-19 DIAGNOSIS — E1122 Type 2 diabetes mellitus with diabetic chronic kidney disease: Secondary | ICD-10-CM | POA: Diagnosis not present

## 2021-02-19 LAB — CULTURE, BLOOD (ROUTINE X 2)
Culture: NO GROWTH
Special Requests: ADEQUATE

## 2021-02-19 LAB — CYTOLOGY - NON PAP

## 2021-02-19 MED ORDER — FUROSEMIDE 40 MG PO TABS
40.0000 mg | ORAL_TABLET | Freq: Every day | ORAL | Status: DC
  Administered 2021-02-19 – 2021-02-21 (×3): 40 mg via ORAL
  Filled 2021-02-19 (×3): qty 1

## 2021-02-19 NOTE — Progress Notes (Signed)
Patient unable to maintain stats on home trilogy vent along with 15L of oxygen.  Patient placed on BIPAP and is currently tolerating settings, saturation improved to 100%. RT will continue to monitor

## 2021-02-19 NOTE — Progress Notes (Signed)
Physical Therapy Treatment Patient Details Name: [name] MRN: [id number] DOB: Jan 21, 1941 Today's Date: 02/19/2021    History of Present Illness Pt is a 80 y.o. male recently admitted to [facility] (02/12/21-02/14/21) with acute on chronic respiratory failure, now readmitted 02/14/21 due to weakness and SOB, unable to stand up from toilet requiring EMS call. Found to be hypoxic. Workup for acute on chronic hypoxic respiratory failure, possibly combination of acute on chronic dHF and PNA. PMH includes OSA, HTN, obesity, afib, CHF, venous stasis dermatitis.   PT Comments    Pt progressing with mobility. Today's session focused on prolonged seated EOB activity, as well as additional standing trials with bilateral platform walker. Pt reliant on elevated bed height and assist+2 to achieve almost fully upright standing, but unable to maintain position due to R knee instability and generalized weakness. Noted pt with RUE/RLE myoclonic-like(?) jerking during session, unsure if related to muscle fatigue. Pt motivated to participate. Continue to recommend SNF-level therapies to maximize functional mobility and independence prior to return home.  SpO2 97% on 3L, down to 87% on 1L Maintaining >/94% on 2L O2    Follow Up Recommendations  SNF;Supervision for mobility/OOB     Equipment Recommendations  None recommended by PT    Recommendations for Other Services       Precautions / Restrictions Precautions Precautions: Fall;Other (comment) Precaution Comments: Bowel incontinence; watch SpO2 Restrictions Weight Bearing Restrictions: No    Mobility  Bed Mobility Overal bed mobility: Needs Assistance Bed Mobility: Supine to Sit     Supine to sit: Mod assist;+2 for safety/equipment;HOB elevated     General bed mobility comments: Increased time and effort reliant on HOB elevated and use of bedrail, modA for HHA to elevate trunk and scoot hips to EOB    Transfers Overall transfer level: Needs  assistance Equipment used: Bilateral platform walker Transfers: Sit to/from Stand Sit to Stand: Max assist;+2 physical assistance;From elevated surface         General transfer comment: Pt performed 2x partial sit<>stands from elevated bed height to bilateral platform RW (shoes and knee braces donned), requiring maxA+2 to achieve near fully upright posture on second trial, but unable to maintain position due to R knee instability and weakness requiring return to sit; pt with specific preference for bed height, walker placement and assist provided by PT/OT; prolonged seated rest break to recover between trials, cues for pursed lip breathing; DOE 3-4/4  Ambulation/Gait             General Gait Details: unable this session   Stairs             Wheelchair Mobility    Modified Rankin (Stroke Patients Only)       Balance Overall balance assessment: Needs assistance Sitting-balance support: Feet supported;Single extremity supported;No upper extremity supported Sitting balance-Leahy Scale: Fair Sitting balance - Comments: Tolerated prolonged seated EOB activity with and without UE support; dependent for donning socks/shoes     Standing balance-Leahy Scale: Poor Standing balance comment: Heavy reliance on BUE support as well as external assist to maintain brief static standing                            Cognition Arousal/Alertness: Awake/alert Behavior During Therapy: WFL for tasks assessed/performed Overall Cognitive Status: No family/caregiver present to determine baseline cognitive functioning  General Comments: Verbose with conversation requiring redirection to task, repetitive with same topics discussed in prior session; suspect near baseline cognition      Exercises      General Comments General comments (skin integrity, edema, etc.): SpO2 97% on 3L; titrated down to 1L and down to 87%, returned to 2L and  >/94%      Pertinent Vitals/Pain Pain Assessment: No/denies pain Pain Intervention(s): Monitored during session    Home Living                      Prior Function            PT Goals (current goals can now be found in the care plan section) Progress towards PT goals: Progressing toward goals    Frequency    Min 2X/week      PT Plan Current plan remains appropriate    Co-evaluation PT/OT/SLP Co-Evaluation/Treatment: Yes Reason for Co-Treatment: For patient/therapist safety;To address functional/ADL transfers PT goals addressed during session: Mobility/safety with mobility;Balance;Proper use of DME        AM-PAC PT "6 Clicks" Mobility   Outcome Measure  Help needed turning from your back to your side while in a flat bed without using bedrails?: A Lot Help needed moving from lying on your back to sitting on the side of a flat bed without using bedrails?: A Lot Help needed moving to and from a bed to a chair (including a wheelchair)?: Total Help needed standing up from a chair using your arms (e.g., wheelchair or bedside chair)?: Total Help needed to walk in hospital room?: Total Help needed climbing 3-5 steps with a railing? : Total 6 Click Score: 8    End of Session Equipment Utilized During Treatment: Oxygen Activity Tolerance: Patient tolerated treatment well;Patient limited by fatigue Patient left: with call bell/phone within reach;in bed (seated EOB to finish having bowel movement) Nurse Communication: Mobility status PT Visit Diagnosis: Other abnormalities of gait and mobility (R26.89)     Time: 2244-9753 PT Time Calculation (min) (ACUTE ONLY): 48 min  Charges:  $Therapeutic Activity: 23-37 mins                    Mabeline Caras, PT, DPT Acute Rehabilitation Services  Pager [phone] Office [place] 02/19/2021, 4:42 PM

## 2021-02-19 NOTE — Progress Notes (Signed)
Occupational Therapy Treatment Patient Details Name: [name] MRN: [id number] DOB: [birth year] Today's Date: 02/19/2021    History of present illness Pt is a 80 y.o. male recently admitted to [facility] (02/12/21-02/14/21) with acute on chronic respiratory failure, now readmitted 02/14/21 due to weakness and SOB, unable to stand up from toilet requiring EMS call. Found to be hypoxic. Workup for acute on chronic hypoxic respiratory failure, possibly combination of acute on chronic dHF and PNA. PMH includes OSA, HTN, obesity, afib, CHF, venous stasis dermatitis.   OT comments  Pt seen for additional session, requiring increased assist for bed mobility. Pt required +2 to return to supine, +3 for rolling and pericare/linen change, due to bowel incontinence. Pt required frequent rest breaks during this session due to SOB and decreased activity tolerance. Acute OT will continue to follow up to assist pt in progressing his goals and increasing functional mobility and ADL performance.    Follow Up Recommendations  SNF    Equipment Recommendations  None recommended by OT    Recommendations for Other Services      Precautions / Restrictions Precautions Precautions: Fall;Other (comment) Precaution Comments: Bowel incontinence; watch SpO2 Required Braces or Orthoses: Other Brace Other Brace: Has bilateral knee braces/compression wrap (for stability) Restrictions Weight Bearing Restrictions: No       Mobility Bed Mobility Overal bed mobility: Needs Assistance Bed Mobility: Sit to Supine;Rolling Rolling: Max assist;+2 for physical assistance;+2 for safety/equipment   Supine to sit: Mod assist;+2 for safety/equipment;HOB elevated Sit to supine: Max assist;+2 for physical assistance   General bed mobility comments: MaxA+2 for return to supine, requiring assist for BLE and trunk management; maxA+2 to scoot up in bed for repositioning, pt able to assist when cued to use BLEs; maxA+2 for rolling  R/L with use of bed rail, +3 assist required for pericare and linen change due to bowel incontinence; pt requiring intermittent rest breaks due to SOB, cues for pursed lip breathing    Transfers Overall transfer level: Needs assistance Equipment used: Bilateral platform walker Transfers: Sit to/from Stand Sit to Stand: Max assist;+2 physical assistance;From elevated surface         General transfer comment: Pt performed 2x partial sit<>stands from elevated bed height to bilateral platform RW (shoes and knee braces donned), requiring maxA+2 to achieve near fully upright posture on second trial, but unable to maintain position due to R knee instability and weakness requiring return to sit; pt with specific preference for bed height, walker placement and assist provided by PT/OT; prolonged seated rest break to recover between trials    Balance Overall balance assessment: Needs assistance Sitting-balance support: Feet supported;Single extremity supported;No upper extremity supported Sitting balance-Leahy Scale: Fair Sitting balance - Comments: Requires assist to doff bilateral shoes and knee braces     Standing balance-Leahy Scale: Poor Standing balance comment: Heavy reliance on BUE support as well as external assist to maintain brief static standing                           ADL either performed or assessed with clinical judgement   ADL Overall ADL's : Needs assistance/impaired Eating/Feeding: Sitting;Set up Eating/Feeding Details (indicate cue type and reason): Pt able to bring a drink to his mouth and take a few sips once placed in front of him         Lower Body Bathing: Bed level;Total assistance;+2 for physical assistance;+2 for safety/equipment Lower Body Bathing Details (indicate cue type and  reason): Pt required +2-3 for rolling to assist with clean up in bed     Lower Body Dressing: Moderate assistance;Sitting/lateral leans Lower Body Dressing Details  (indicate cue type and reason): Pt donning shoes and knee braces, needs assist with back of shoes and tying knots and with knee braces pt needs assist getting them pulled up to his calves then he can pull them up and secure them the rest of the way,             Functional mobility during ADLs: Maximal assistance;+2 for physical assistance;+2 for safety/equipment General ADL Comments: Assisting pt in returning to supine and bed mobility for clean up and linen change.     Vision       Perception     Praxis      Cognition Arousal/Alertness: Awake/alert Behavior During Therapy: WFL for tasks assessed/performed Overall Cognitive Status: No family/caregiver present to determine baseline cognitive functioning                                 General Comments: Verbose with conversation requiring redirection to task, repetitive with same topics discussed in prior session; suspect near baseline cognition        Exercises     Shoulder Instructions       General Comments RN present for +3 assist to complete ADL tasks    Pertinent Vitals/ Pain       Pain Assessment: Faces Faces Pain Scale: Hurts a little bit Pain Location: Perineal area with pericare/hygiene Pain Descriptors / Indicators: Discomfort;Grimacing Pain Intervention(s): Limited activity within patient's tolerance  Home Living                                          Prior Functioning/Environment              Frequency  Min 2X/week        Progress Toward Goals  OT Goals(current goals can now be found in the care plan section)  Progress towards OT goals: Progressing toward goals  Acute Rehab OT Goals Patient Stated Goal: none stated this session OT Goal Formulation: With patient Time For Goal Achievement: 03/03/21 Potential to Achieve Goals: Good ADL Goals Pt Will Perform Lower Body Bathing: with mod assist;sitting/lateral leans Pt Will Perform Lower Body Dressing:  with mod assist;sitting/lateral leans Pt Will Transfer to Toilet: with max assist;squat pivot transfer Pt Will Perform Toileting - Clothing Manipulation and hygiene: with mod assist;with adaptive equipment;sit to/from stand Additional ADL Goal #1: Pt will complete bed mobility with Min A.  Plan Discharge plan remains appropriate;Frequency remains appropriate    Co-evaluation    PT/OT/SLP Co-Evaluation/Treatment: Yes Reason for Co-Treatment: For patient/therapist safety;To address functional/ADL transfers PT goals addressed during session: Mobility/safety with mobility OT goals addressed during session: ADL's and self-care      AM-PAC OT "6 Clicks" Daily Activity     Outcome Measure   Help from another person eating meals?: A Little Help from another person taking care of personal grooming?: A Little Help from another person toileting, which includes using toliet, bedpan, or urinal?: Total Help from another person bathing (including washing, rinsing, drying)?: A Lot Help from another person to put on and taking off regular upper body clothing?: A Little Help from another person to put on and taking off regular lower body  clothing?: A Lot 6 Click Score: 14    End of Session Equipment Utilized During Treatment: Oxygen  OT Visit Diagnosis: Muscle weakness (generalized) (M62.81);Other abnormalities of gait and mobility (R26.89);Unsteadiness on feet (R26.81)   Activity Tolerance Patient tolerated treatment well   Patient Left in bed;with call bell/phone within reach   Nurse Communication Mobility status        Time: 0379-4446 OT Time Calculation (min): 21 min  Charges: OT General Charges $OT Visit: 1 Visit OT Treatments $Self Care/Home Management : 8-22 mins $Therapeutic Activity: 8-22 mins  Larrie Lucia H., OTR/L Acute Rehabilitation  Khyson Sebesta Elane Manav Pierotti 02/19/2021, 5:40 PM

## 2021-02-19 NOTE — Progress Notes (Signed)
PROGRESS NOTE    [name]  KZL:[id number] DOB: 04-17-1941 DOA: 02/14/2021 PCP: [name], MD    Chief Complaint  Patient presents with  . Weakness  . Shortness of Breath    Brief Narrative:  [name] is a 80 year old male with past medical history significant for paroxysmal atrial fibrillation, aortic stenosis s/p TAVR, chronic diastolic congestive heart failure, degenerative disc disease, gout, hyperlipidemia, hypertension, morbid obesity, OSA/hypoventilation syndrome, OA, CKD3a, peripheral neuropathy, CAD, history of prostate cancer, venous stasis dermatitis who presented to [facility] ED on 5/28 via EMS for weakness and dyspnea.  Patient reports was unable to get off the toilet with associated shortness of breath and generalized weakness.  Patient recently hospitalized at [facility] long hospital from  5/26 through 5/28 for decompensated OSA/OHS.  During that hospitalization, his BiPAP settings were optimized with a new facemask.  Patient also reports during the hospitalization he had very little mobility which he believes caused deconditioning when he returned home. PT seen and examined at bedside, he is improving, appears to be in good spirits.  Assessment & Plan:   Principal Problem:   Acute respiratory failure with hypoxia (HCC) Active Problems:   HYPERCHOLESTEROLEMIA   Obesity, Class III, BMI 40-49.9 (morbid obesity) (HCC)   Macrocytic anemia   Obstructive sleep apnea   COPD mixed type (HCC)   Essential hypertension   CKD stage 3 secondary to diabetes (HCC)   Obesity hypoventilation syndrome (HCC)   Acute on chronic respiratory failure with hypoxemia (HCC)   Acute on chronic hypoxic respiratory failure present on admission possibly combination of acute on chronic diastolic heart failure and pneumonia. Chest x-ray showing right basilar airspace opacity on admission, mildly elevated WBC count. He was started on vancomycin and cefepime and currently  requiring up to 3 L of nasal cannula oxygen to keep sats greater than 90% CT chest without contrast ordered,  Moderate right, small left pleural effusions and associated atelectasis or consolidation. Mild interlobular septal thickening throughout the lungs, consistent with mild pulmonary edema.  IR consulted, for thoracentesis, about 700 ml of sero sanguinous fluid taken out and sent for analysis. So far cultures are negative. Sputum cultures pending.     Stage IIIa CKD Creatinine slightly up from baseline 1.3.  Continue to monitor. Creatinine stable around 1.4.no changes in meds. Restarted the diuretics, monitor his creatinine.     COPD Continue bronchodilators as needed. No wheezing heard.  On 3 L of oxygen at night at home.   Morbidly obese Recommend outpatient follow-up with PCP patient may benefit from bariatric evaluation as outpatient. Body mass index is 48.97 kg/m.     Obstructive sleep apnea/obesity hypoventilation syndrome Continue with BIPAP and use trilogy at home.    Mild acute on chronic diastolic heart failure Restarted the diuretics. On lasix 40 mg daily.  Continue strict intake and output and daily weights.     hyperlipidemia:  Resume statin.    Anemia of chronic disease:  Hemoglobin is stable around 10.   DVT prophylaxis: Eliquis Code Status: Full code full code Family Communication: none at bedside.  Disposition:   Status is: Inpatient  Remains inpatient appropriate because:IV treatments appropriate due to intensity of illness or inability to take PO   Dispo:  Patient From: Home  Planned Disposition: [place]  Medically stable for discharge: No         Consultants:   None.    Procedures: CT chest without contrast   Antimicrobials: cefepime  Subjective:  sob improved. No chest pain.   Objective: Vitals:   02/19/21 0431 02/19/21 0506 02/19/21 0803 02/19/21 1355  BP:  121/63  (!) 128/51  Pulse: 60 61  71   Resp: 16 16  20   Temp:  98.1 F (36.7 C)  98.4 F (36.9 C)  TempSrc:  Axillary  Oral  SpO2: 100% 100% 95% 92%  Weight:      Height:        Intake/Output Summary (Last 24 hours) at 02/19/2021 1446 Last data filed at 02/19/2021 1222 Gross per 24 hour  Intake 120 ml  Output 100 ml  Net 20 ml   Filed Weights   02/14/21 1927 02/15/21 0500  Weight: (!) 171.5 kg (!) 173 kg    Examination:  General exam:  Alert and comfortable.  Respiratory system: diminished at bases. Still tachypnea but better than yesterday.  Cardiovascular system: S1S2 heard, RRR, no JVD, no pedal edema.  Gastrointestinal system: Abdomen is soft, non tender non distended, bowel sounds good.  Central nervous system: alert and oriented, non focal/ Extremities: No cyanosis.  Skin: no ulcers seen.  Psychiatry: Mood is appropriate.     Data Reviewed: I have personally reviewed following labs and imaging studies  CBC: Recent Labs  Lab 02/14/21 1935 02/14/21 2120 02/15/21 0858 02/16/21 0149 02/17/21 0312 02/18/21 0239  WBC 10.6*  --  7.7 7.8 6.5 6.5  HGB 12.2* 12.6* 11.3* 11.0* 10.5* 10.4*  HCT 38.4* 37.0* 36.0* 34.3* 33.3* 33.3*  MCV 106.4*  --  106.5* 105.9* 105.4* 106.1*  PLT 131*  --  100* 98* 97* 116*    Basic Metabolic Panel: Recent Labs  Lab 02/13/21 0255 02/14/21 0127 02/14/21 1935 02/14/21 2120 02/15/21 0858 02/16/21 0149 02/17/21 0312 02/18/21 0239  NA 143   < > 137 137 138 138 137 140  K 4.4   < > 3.9 4.0 4.0 3.6 3.6 3.6  CL 99   < > 94*  --  98 98 96* 97*  CO2 36*   < > 36*  --  33* 34* 31 35*  GLUCOSE 88   < > 127*  --  94 109* 101* 96  BUN 34*   < > 32*  --  35* 35* 33* 36*  CREATININE 1.31*   < > 1.67*  --  1.65* 1.52* 1.40* 1.44*  CALCIUM 9.2   < > 8.9  --  8.5* 8.6* 8.5* 8.6*  MG 2.0  --   --   --   --  2.1 2.2  --   PHOS 4.2  --   --   --   --   --   --   --    < > = values in this interval not displayed.    GFR: Estimated Creatinine Clearance: 70.9 mL/min (A) (by  C-G formula based on SCr of 1.44 mg/dL (H)).  Liver Function Tests: Recent Labs  Lab 02/15/21 0858  AST 27  ALT 19  ALKPHOS 48  BILITOT 1.3*  PROT 5.3*  ALBUMIN 2.8*    CBG: No results for input(s): GLUCAP in the last 168 hours.   Recent Results (from the past 240 hour(s))  Blood culture (routine single)     Status: None   Collection Time: 02/12/21 12:44 PM   Specimen: Left Antecubital; Blood  Result Value Ref Range Status   Specimen Description   Final    LEFT ANTECUBITAL Performed at [facility], [address] [place], [place] [address]  Special Requests   Final    Blood Culture adequate volume Performed at [facility], [address] [place], [place] [address]    Culture   Final    NO GROWTH 5 DAYS Performed at [facility], [place] [address], [place], [place] [address]    Report Status 02/17/2021 FINAL  Final  Urine culture     Status: Abnormal   Collection Time: 02/12/21 12:44 PM   Specimen: In/Out Cath Urine  Result Value Ref Range Status   Specimen Description   Final    IN/OUT CATH URINE Performed at [facility], [address], [place], [place] [address]    Special Requests   Final    NONE Performed at [facility], [address], [place], [place] [address]    Culture (A)  Final    10,000 COLONIES/mL AEROCOCCUS URINAE Standardized susceptibility testing for this organism is not available. Performed at [facility], [place] [address] [place],  [address]    Report Status 02/16/2021 FINAL  Final  Resp Panel by RT-PCR (Flu A&B, Covid)     Status: None   Collection Time: 02/12/21 12:54 PM   Specimen: Nasopharyngeal(NP) swabs in vial transport medium  Result Value Ref Range Status   SARS Coronavirus 2 by RT PCR NEGATIVE NEGATIVE Final    Comment: (NOTE) SARS-CoV-2 target nucleic acids are NOT DETECTED.  The SARS-CoV-2 RNA is generally detectable in  upper respiratory specimens during the acute phase of infection. The lowest concentration of SARS-CoV-2 viral copies this assay can detect is 138 copies/mL. A negative result does not preclude SARS-Cov-2 infection and should not be used as the sole basis for treatment or other patient management decisions. A negative result may occur with  improper specimen collection/handling, submission of specimen other than nasopharyngeal swab, presence of viral mutation(s) within the areas targeted by this assay, and inadequate number of viral copies(<138 copies/mL). A negative result must be combined with clinical observations, patient history, and epidemiological information. The expected result is Negative.  Fact Sheet for Patients:  EntrepreneurPulse.com.au  Fact Sheet for Healthcare Providers:  IncredibleEmployment.be  This test is no t yet approved or cleared by the [place] FDA and  has been authorized for detection and/or diagnosis of SARS-CoV-2 by FDA under an Emergency Use Authorization (EUA). This EUA will remain  in effect (meaning this test can be used) for the duration of the COVID-19 declaration under Section 564(b)(1) of the Act, 21 U.S.C.section 360bbb-3(b)(1), unless the authorization is terminated  or revoked sooner.       Influenza A by PCR NEGATIVE NEGATIVE Final   Influenza B by PCR NEGATIVE NEGATIVE Final    Comment: (NOTE) The Xpert Xpress SARS-CoV-2/FLU/RSV plus assay is intended as an aid in the diagnosis of influenza from Nasopharyngeal swab specimens and should not be used as a sole basis for treatment. Nasal washings and aspirates are unacceptable for Xpert Xpress SARS-CoV-2/FLU/RSV testing.  Fact Sheet for Patients: EntrepreneurPulse.com.au  Fact Sheet for Healthcare Providers: IncredibleEmployment.be  This test is not yet approved or cleared by the [place] FDA and has been  authorized for detection and/or diagnosis of SARS-CoV-2 by FDA under an Emergency Use Authorization (EUA). This EUA will remain in effect (meaning this test can be used) for the duration of the COVID-19 declaration under Section 564(b)(1) of the Act, 21 U.S.C. section 360bbb-3(b)(1), unless the authorization is terminated or revoked.  Performed at [facility], [address] [place],  [place] [address]   MRSA PCR Screening     Status: None   Collection Time: 02/12/21  5:55 PM   Specimen: Nasopharyngeal  Result Value Ref Range Status   MRSA by PCR NEGATIVE NEGATIVE Final    Comment:        The GeneXpert MRSA Assay (FDA approved for NASAL specimens only), is one component of a comprehensive MRSA colonization surveillance program. It is not intended to diagnose MRSA infection nor to guide or monitor treatment for MRSA infections. Performed at [facility], [place] [address], [place], [place] [address]   Resp Panel by RT-PCR (Flu A&B, Covid) Nasopharyngeal Swab     Status: None   Collection Time: 02/14/21  7:40 PM   Specimen: Nasopharyngeal Swab; Nasopharyngeal(NP) swabs in vial transport medium  Result Value Ref Range Status   SARS Coronavirus 2 by RT PCR NEGATIVE NEGATIVE Final    Comment: (NOTE) SARS-CoV-2 target nucleic acids are NOT DETECTED.  The SARS-CoV-2 RNA is generally detectable in upper respiratory specimens during the acute phase of infection. The lowest concentration of SARS-CoV-2 viral copies this assay can detect is 138 copies/mL. A negative result does not preclude SARS-Cov-2 infection and should not be used as the sole basis for treatment or other patient management decisions. A negative result may occur with  improper specimen collection/handling, submission of specimen other than nasopharyngeal swab, presence of viral mutation(s) within the areas targeted by this assay, and inadequate number of viral copies(<138  copies/mL). A negative result must be combined with clinical observations, patient history, and epidemiological information. The expected result is Negative.  Fact Sheet for Patients:  EntrepreneurPulse.com.au  Fact Sheet for Healthcare Providers:  IncredibleEmployment.be  This test is no t yet approved or cleared by the [place] FDA and  has been authorized for detection and/or diagnosis of SARS-CoV-2 by FDA under an Emergency Use Authorization (EUA). This EUA will remain  in effect (meaning this test can be used) for the duration of the COVID-19 declaration under Section 564(b)(1) of the Act, 21 U.S.C.section 360bbb-3(b)(1), unless the authorization is terminated  or revoked sooner.       Influenza A by PCR NEGATIVE NEGATIVE Final   Influenza B by PCR NEGATIVE NEGATIVE Final    Comment: (NOTE) The Xpert Xpress SARS-CoV-2/FLU/RSV plus assay is intended as an aid in the diagnosis of influenza from Nasopharyngeal swab specimens and should not be used as a sole basis for treatment. Nasal washings and aspirates are unacceptable for Xpert Xpress SARS-CoV-2/FLU/RSV testing.  Fact Sheet for Patients: EntrepreneurPulse.com.au  Fact Sheet for Healthcare Providers: IncredibleEmployment.be  This test is not yet approved or cleared by the [place] FDA and has been authorized for detection and/or diagnosis of SARS-CoV-2 by FDA under an Emergency Use Authorization (EUA). This EUA will remain in effect (meaning this test can be used) for the duration of the COVID-19 declaration under Section 564(b)(1) of the Act, 21 U.S.C. section 360bbb-3(b)(1), unless the authorization is terminated or revoked.  Performed at [facility], [place] [address] [place], [place] [address]   Blood culture (routine x 2)     Status: None   Collection Time: 02/14/21  7:40 PM   Specimen: BLOOD LEFT WRIST  Result Value Ref  Range Status   Specimen Description BLOOD LEFT WRIST  Final   Special Requests   Final    BOTTLES DRAWN AEROBIC AND ANAEROBIC Blood Culture adequate volume   Culture   Final    NO GROWTH 5  DAYS Performed at [facility], [place] [address] [place], [place] [address]    Report Status 02/19/2021 FINAL  Final  Blood culture (routine x 2)     Status: None (Preliminary result)   Collection Time: 02/15/21  1:20 AM   Specimen: BLOOD  Result Value Ref Range Status   Specimen Description BLOOD SITE NOT SPECIFIED  Final   Special Requests   Final    BOTTLES DRAWN AEROBIC AND ANAEROBIC Blood Culture adequate volume   Culture   Final    NO GROWTH 4 DAYS Performed at [facility], [place] [address] [place], [place] [address]    Report Status PENDING  Incomplete  MRSA PCR Screening     Status: None   Collection Time: 02/15/21  8:36 AM   Specimen: Nasopharyngeal  Result Value Ref Range Status   MRSA by PCR NEGATIVE NEGATIVE Final    Comment:        The GeneXpert MRSA Assay (FDA approved for NASAL specimens only), is one component of a comprehensive MRSA colonization surveillance program. It is not intended to diagnose MRSA infection nor to guide or monitor treatment for MRSA infections. Performed at [facility], [place] [address] [place], [place] [address]   Body fluid culture w Gram Stain     Status: None (Preliminary result)   Collection Time: 02/18/21 10:34 AM   Specimen: Lung, Right; Pleural Fluid  Result Value Ref Range Status   Specimen Description PLEURAL FLUID  Final   Special Requests LUNG RIGHT  Final   Gram Stain   Final    ABUNDANT WBC PRESENT,BOTH PMN AND MONONUCLEAR NO ORGANISMS SEEN    Culture   Final    NO GROWTH < 24 HOURS Performed at [facility], [place] [address] [place], [place] [address]    Report Status PENDING  Incomplete         Radiology Studies: DG Chest 1 View  Result Date: 02/18/2021 CLINICAL DATA:  Status post thoracentesis  EXAM: CHEST  1 VIEW COMPARISON:  Feb 17, 2021 chest radiograph and chest CT FINDINGS: No pneumothorax. Small residual pleural effusion on the right. No edema or airspace opacity. Stable cardiomegaly with pacemaker leads attached to right atrium and right ventricle. There is aortic atherosclerosis. Mitral annulus calcification noted. Status post aortic valve replacement. No adenopathy. Degenerative change in each shoulder noted. IMPRESSION: No pneumothorax. Small residual pleural effusion on the right. No edema or airspace opacity. Stable cardiac silhouette with postoperative changes. Aortic Atherosclerosis (ICD10-I70.0). Electronically Signed   By: Lowella Grip III M.D.   On: 02/18/2021 10:48   IR THORACENTESIS ASP PLEURAL SPACE W/IMG GUIDE  Result Date: 02/18/2021 INDICATION: Shortness of breath. Pneumonia. Right-sided pleural effusion. Request diagnostic and therapeutic thoracentesis. EXAM: ULTRASOUND GUIDED RIGHT THORACENTESIS MEDICATIONS: 1% plain lidocaine, 8 mL COMPLICATIONS: None immediate. PROCEDURE: An ultrasound guided thoracentesis was thoroughly discussed with the patient and questions answered. The benefits, risks, alternatives and complications were also discussed. The patient understands and wishes to proceed with the procedure. Written consent was obtained. Ultrasound was performed to localize and mark an adequate pocket of fluid in the right chest. The area was then prepped and draped in the normal sterile fashion. 1% Lidocaine was used for local anesthesia. Under ultrasound guidance a 6 Fr Safe-T-Centesis catheter was introduced. Thoracentesis was performed. The catheter was removed and a dressing applied. FINDINGS: A total of approximately 700 mL of serosanguineous pleural fluid was removed. Samples were sent to the  laboratory as requested by the clinical team. IMPRESSION: Successful ultrasound guided right thoracentesis yielding 700 mL of pleural fluid. Read by: Ascencion Dike PA-C  Electronically Signed   By: Jacqulynn Cadet M.D.   On: 02/18/2021 11:06        Scheduled Meds: . allopurinol  100 mg Oral Daily  . apixaban  5 mg Oral BID  . vitamin C  500 mg Oral Daily  . atorvastatin  80 mg Oral Daily  . famotidine  40 mg Oral Daily  . fluticasone furoate-vilanterol  1 puff Inhalation Daily   And  . umeclidinium bromide  1 puff Inhalation Daily  . gabapentin  300 mg Oral BID  . polyethylene glycol  17 g Oral Daily   Continuous Infusions: . ceFEPime (MAXIPIME) IV 2 g (02/19/21 0844)     LOS: 4 days        Hosie Poisson, MD [facility]   To contact the attending provider between 7A-7P or the covering provider during after hours 7P-7A, please log into the web site [email or web address] and access using universal [place] password for that web site. If you do not have the password, please call the hospital operator.  02/19/2021, 2:46 PM

## 2021-02-19 NOTE — Progress Notes (Signed)
Occupational Therapy Treatment Patient Details Name: [name] MRN: [id number] DOB: [birth year] Today's Date: 02/19/2021    History of present illness Pt is a 80 y.o. male recently admitted to [facility] (02/12/21-02/14/21) with acute on chronic respiratory failure, now readmitted 02/14/21 due to weakness and SOB, unable to stand up from toilet requiring EMS call. Found to be hypoxic. Workup for acute on chronic hypoxic respiratory failure, possibly combination of acute on chronic dHF and PNA. PMH includes OSA, HTN, obesity, afib, CHF, venous stasis dermatitis.   OT comments  Pt making incremental progress towards OT goals this session. OT/PT cotreated this session to focus on OOB mobility and transfers. Pt was agreeable, however required encouragment and support to stand due to pt becoming fearful of falling. Pt attempted to stand x2 after socks and knee braces were donned. Pt is able to almost fully stand before his R knee buckles and he has to sit back down. Acute OT and PT will continue to follow pt to assist with progressing functional mobility and ADL performance.    Follow Up Recommendations  SNF    Equipment Recommendations  None recommended by OT    Recommendations for Other Services      Precautions / Restrictions Precautions Precautions: Fall;Other (comment) Precaution Comments: Bowel incontinence; watch SpO2 Restrictions Weight Bearing Restrictions: No       Mobility Bed Mobility Overal bed mobility: Needs Assistance Bed Mobility: Supine to Sit     Supine to sit: Mod assist;+2 for safety/equipment;HOB elevated     General bed mobility comments: Increased time and effort reliant on HOB elevated and use of bedrail, modA for HHA to elevate trunk and scoot hips to EOB    Transfers Overall transfer level: Needs assistance Equipment used: Bilateral platform walker Transfers: Sit to/from Stand Sit to Stand: Max assist;+2 physical assistance;From elevated surface          General transfer comment: Pt performed 2x partial sit<>stands from elevated bed height to bilateral platform RW (shoes and knee braces donned), requiring maxA+2 to achieve near fully upright posture on second trial, but unable to maintain position due to R knee instability and weakness requiring return to sit; pt with specific preference for bed height, walker placement and assist provided by PT/OT; prolonged seated rest break to recover between trials    Balance Overall balance assessment: Needs assistance Sitting-balance support: Feet supported;Single extremity supported;No upper extremity supported Sitting balance-Leahy Scale: Fair Sitting balance - Comments: Tolerated prolonged seated EOB activity with and without UE support; dependent for donning socks/shoes     Standing balance-Leahy Scale: Poor Standing balance comment: Heavy reliance on BUE support as well as external assist to maintain brief static standing                           ADL either performed or assessed with clinical judgement   ADL Overall ADL's : Needs assistance/impaired Eating/Feeding: Sitting;Set up Eating/Feeding Details (indicate cue type and reason): Pt able to bring a drink to his mouth and take a few sips once placed in front of him         Lower Body Bathing: Bed level;Total assistance;+2 for physical assistance;+2 for safety/equipment Lower Body Bathing Details (indicate cue type and reason): Pt required +2-3 for rolling to assist with clean up in bed     Lower Body Dressing: Moderate assistance;Sitting/lateral leans Lower Body Dressing Details (indicate cue type and reason): Pt donning shoes and knee braces, needs assist  with back of shoes and tying knots and with knee braces pt needs assist getting them pulled up to his calves then he can pull them up and secure them the rest of the way,             Functional mobility during ADLs: Maximal assistance;+2 for physical assistance;+2  for safety/equipment General ADL Comments: Pt attempting to stand and transfer this session, using his platform walker, requiring max A +2 to stand x2, unable to make a transfer.     Vision       Perception     Praxis      Cognition Arousal/Alertness: Awake/alert Behavior During Therapy: WFL for tasks assessed/performed Overall Cognitive Status: No family/caregiver present to determine baseline cognitive functioning                                 General Comments: Verbose with conversation requiring redirection to task, repetitive with same topics discussed in prior session; suspect near baseline cognition        Exercises     Shoulder Instructions       General Comments SpO2 97% on 3L; titrated down to 1L and down to 87%, returned to 2L and >94%    Pertinent Vitals/ Pain       Pain Assessment: No/denies pain Pain Intervention(s): Monitored during session  Home Living                                          Prior Functioning/Environment              Frequency  Min 2X/week        Progress Toward Goals  OT Goals(current goals can now be found in the care plan section)  Progress towards OT goals: Progressing toward goals  Acute Rehab OT Goals Patient Stated Goal: to go home OT Goal Formulation: With patient Time For Goal Achievement: 03/03/21 Potential to Achieve Goals: Good ADL Goals Pt Will Perform Lower Body Bathing: with mod assist;sitting/lateral leans Pt Will Perform Lower Body Dressing: with mod assist;sitting/lateral leans Pt Will Transfer to Toilet: with max assist;squat pivot transfer Pt Will Perform Toileting - Clothing Manipulation and hygiene: with mod assist;with adaptive equipment;sit to/from stand Additional ADL Goal #1: Pt will complete bed mobility with Min A.  Plan Discharge plan remains appropriate;Frequency remains appropriate    Co-evaluation    PT/OT/SLP Co-Evaluation/Treatment: Yes Reason  for Co-Treatment: For patient/therapist safety;To address functional/ADL transfers PT goals addressed during session: Mobility/safety with mobility;Balance;Proper use of DME OT goals addressed during session: ADL's and self-care;Proper use of Adaptive equipment and DME      AM-PAC OT "6 Clicks" Daily Activity     Outcome Measure   Help from another person eating meals?: A Little Help from another person taking care of personal grooming?: A Little Help from another person toileting, which includes using toliet, bedpan, or urinal?: Total Help from another person bathing (including washing, rinsing, drying)?: A Lot Help from another person to put on and taking off regular upper body clothing?: A Little Help from another person to put on and taking off regular lower body clothing?: A Lot 6 Click Score: 14    End of Session Equipment Utilized During Treatment: Rolling walker;Oxygen  OT Visit Diagnosis: Muscle weakness (generalized) (M62.81);Other abnormalities of gait and mobility (R26.89);Unsteadiness on  feet (R26.81)   Activity Tolerance Patient tolerated treatment well   Patient Left in bed;with call bell/phone within reach   Nurse Communication Mobility status        Time: 9295-7473 OT Time Calculation (min): 45 min  Charges: OT General Charges $OT Visit: 1 Visit OT Treatments $Therapeutic Activity: 8-22 mins  Lyniah Fujita H., OTR/L Acute Rehabilitation  [name] Elane Sakina Briones 02/19/2021, 5:07 PM

## 2021-02-19 NOTE — TOC Progression Note (Addendum)
Transition of Care [facility]) - Progression Note    Patient Details  Name: [name] MRN: [id number] Date of Birth: [birth year]  Transition of [facility]) CM/SW [place], [place] Phone Number: 02/19/2021, 12:21 PM  Clinical Narrative:     [facility] states they can accept pt tomorrow. CSW is notified by MD that pt states his Trilogy is malfunctioning. He receives his Trilogy through [employer] and reports it only works sometimes. CSW called Zack with [employer]; he will look into it but informed CSW that pt would likely receive a bill for the Trilogy if he goes to SNF as insurance will not pay for Trilogy and SNF at the same time. Edwyna Ready will look into possible solutions.   1245: CSW spoke with [facility]. They were not aware that pt was on Trilogy. They cannot accept pt on Trilogy. CSW inquired if pt could come on regular Bipap. Liaison asked if pt had his own which he does not. CSW awaiting decision on whether or not [facility] could order/provide a Bipap for pt while he is at [facility].   1510: CSW called and left message with [facility] regarding Bipap; requested return call.   Expected Discharge Plan: [place] Barriers to Discharge: Continued Medical Work up  Expected Discharge Plan and Services Expected Discharge Plan: [place] In-house Referral: Clinical Social Work     Living arrangements for the past 2 months: Single Family Home                                       Social Determinants of Health (SDOH) Interventions    Readmission Risk Interventions No flowsheet data found.

## 2021-02-19 NOTE — Progress Notes (Signed)
Physical Therapy Treatment Patient Details Name: [name] MRN: [id number] DOB: [birth year] Today's Date: 02/19/2021    History of Present Illness Pt is a 80 y.o. male recently admitted to [facility] (02/12/21-02/14/21) with acute on chronic respiratory failure, now readmitted 02/14/21 due to weakness and SOB, unable to stand up from toilet requiring EMS call. Found to be hypoxic. Workup for acute on chronic hypoxic respiratory failure, possibly combination of acute on chronic dHF and PNA. PMH includes OSA, HTN, obesity, afib, CHF, venous stasis dermatitis.   PT Comments    Pt seen for additional session, requiring increased assist for bed mobility, including return to supine, rolling and repositioning. Pt able to assist minimally with BUE/BLE support when cued, heavy reliance on use of bed rails; +3 assist required to complete pericare and linen change due to bowel incontinence. Pt requires frequent rest breaks with activity secondary to SOB. Continue to recommend SNF-level therapies to maximize functional mobility and independence, as well as decrease caregiver burden.   Follow Up Recommendations  SNF;Supervision for mobility/OOB     Equipment Recommendations  Hospital bed; [facility] lift   Recommendations for Other Services       Precautions / Restrictions Precautions Precautions: Fall;Other (comment) Precaution Comments: Bowel incontinence; watch SpO2 Required Braces or Orthoses: Other Brace Other Brace: Has bilateral knee braces/compression wrap (for stability) Restrictions Weight Bearing Restrictions: No    Mobility  Bed Mobility Overal bed mobility: Needs Assistance Bed Mobility: Sit to Supine;Rolling Rolling: Max assist;+2 for physical assistance;+2 for safety/equipment    Sit to supine: Max assist;+2 for physical assistance   General bed mobility comments: MaxA+2 for return to supine, requiring assist for BLE and trunk management; maxA+2 to scoot up in bed for repositioning,  pt able to assist when cued to use BLEs; maxA+2 for rolling R/L with use of bed rail, +3 assist required for pericare and linen change due to bowel incontinence; pt requiring intermittent rest breaks due to SOB, cues for pursed lip breathing    Transfers Overall transfer level: Needs assistance Equipment used: Bilateral platform walker Transfers: Sit to/from Stand Sit to Stand: Max assist;+2 physical assistance;From elevated surface           Ambulation/Gait             General Gait Details: unable this session   Stairs             Wheelchair Mobility    Modified Rankin (Stroke Patients Only)       Balance Overall balance assessment: Needs assistance Sitting-balance support: Feet supported;Single extremity supported;No upper extremity supported Sitting balance-Leahy Scale: Fair Sitting balance - Comments: Requires assist to doff bilateral shoes and knee braces                                 Cognition Arousal/Alertness: Awake/alert Behavior During Therapy: WFL for tasks assessed/performed Overall Cognitive Status: No family/caregiver present to determine baseline cognitive functioning                                 General Comments: Verbose with conversation requiring redirection to task, repetitive with same topics discussed in prior session; suspect near baseline cognition      Exercises      General Comments General comments (skin integrity, edema, etc.): RN present for +3 assist to complete ADL tasks      Pertinent  Vitals/Pain Pain Assessment: Faces Faces Pain Scale: Hurts a little bit Pain Location: Perineal area with pericare/hygiene Pain Descriptors / Indicators: Discomfort;Grimacing Pain Intervention(s): Limited activity within patient's tolerance    Home Living                      Prior Function            PT Goals (current goals can now be found in the care plan section) Acute Rehab PT  Goals Patient Stated Goal: to go home Progress towards PT goals: Progressing toward goals (slowly)    Frequency    Min 2X/week      PT Plan Current plan remains appropriate    Co-evaluation PT/OT/SLP Co-Evaluation/Treatment: Yes Reason for Co-Treatment: For patient/therapist safety;To address functional/ADL transfers PT goals addressed during session: Mobility/safety with mobility OT goals addressed during session: ADL's and self-care;Proper use of Adaptive equipment and DME      AM-PAC PT "6 Clicks" Mobility   Outcome Measure  Help needed turning from your back to your side while in a flat bed without using bedrails?: Total Help needed moving from lying on your back to sitting on the side of a flat bed without using bedrails?: Total Help needed moving to and from a bed to a chair (including a wheelchair)?: Total Help needed standing up from a chair using your arms (e.g., wheelchair or bedside chair)?: Total Help needed to walk in hospital room?: Total Help needed climbing 3-5 steps with a railing? : Total 6 Click Score: 6    End of Session Equipment Utilized During Treatment: Oxygen Activity Tolerance: Patient tolerated treatment well;Patient limited by fatigue Patient left: in bed;with call bell/phone within reach;with nursing/sitter in room Nurse Communication: Mobility status;Other (comment) (RN to set bed alarm) PT Visit Diagnosis: Other abnormalities of gait and mobility (R26.89)     Time: 1950-9326 PT Time Calculation (min) (ACUTE ONLY): 24 min  Charges:  $Therapeutic Activity: 8-22 mins                     Mabeline Caras, PT, DPT Acute Rehabilitation Services  Pager [phone] Office [phone]  Derry Lory 02/19/2021, 5:19 PM

## 2021-02-19 NOTE — TOC Progression Note (Signed)
Transition of Care [facility]) - Progression Note    Patient Details  Name: [name] MRN: [id number] Date of Birth: 1941-04-05  Transition of Care [facility]) CM/SW [place], [place] Phone Number: 02/19/2021, 9:53 AM  Clinical Narrative:     CSW called [facility] liaison; voicemailbox full. CSW texted liaison requesting update on auth. Also requested [facility] covid test policy.   Expected Discharge Plan: [place] Barriers to Discharge: Continued Medical Work up  Expected Discharge Plan and Services Expected Discharge Plan: [place] In-house Referral: Clinical Social Work     Living arrangements for the past 2 months: Single Family Home                                       Social Determinants of Health (SDOH) Interventions    Readmission Risk Interventions No flowsheet data found.

## 2021-02-20 DIAGNOSIS — J449 Chronic obstructive pulmonary disease, unspecified: Secondary | ICD-10-CM | POA: Diagnosis not present

## 2021-02-20 DIAGNOSIS — J9621 Acute and chronic respiratory failure with hypoxia: Secondary | ICD-10-CM | POA: Diagnosis not present

## 2021-02-20 DIAGNOSIS — J9601 Acute respiratory failure with hypoxia: Secondary | ICD-10-CM | POA: Diagnosis not present

## 2021-02-20 DIAGNOSIS — N183 Chronic kidney disease, stage 3 unspecified: Secondary | ICD-10-CM | POA: Diagnosis not present

## 2021-02-20 DIAGNOSIS — E1122 Type 2 diabetes mellitus with diabetic chronic kidney disease: Secondary | ICD-10-CM | POA: Diagnosis not present

## 2021-02-20 LAB — CULTURE, BLOOD (ROUTINE X 2)
Culture: NO GROWTH
Special Requests: ADEQUATE

## 2021-02-20 LAB — BASIC METABOLIC PANEL
Anion gap: 8 (ref 5–15)
BUN: 35 mg/dL — ABNORMAL HIGH (ref 8–23)
CO2: 32 mmol/L (ref 22–32)
Calcium: 8.6 mg/dL — ABNORMAL LOW (ref 8.9–10.3)
Chloride: 100 mmol/L (ref 98–111)
Creatinine, Ser: 1.37 mg/dL — ABNORMAL HIGH (ref 0.61–1.24)
GFR, Estimated: 53 mL/min — ABNORMAL LOW (ref >60)
Glucose, Bld: 87 mg/dL (ref 70–99)
Potassium: 3.8 mmol/L (ref 3.5–5.1)
Sodium: 140 mmol/L (ref 135–145)

## 2021-02-20 NOTE — Progress Notes (Signed)
PROGRESS NOTE    [name]  HYQ:[id number] DOB: 15-May-1941 DOA: 02/14/2021 PCP: [name], MD    Chief Complaint  Patient presents with  . Weakness  . Shortness of Breath    Brief Narrative:  [name] is a 80 year old male with past medical history significant for paroxysmal atrial fibrillation, aortic stenosis s/p TAVR, chronic diastolic congestive heart failure, degenerative disc disease, gout, hyperlipidemia, hypertension, morbid obesity, OSA/hypoventilation syndrome, OA, CKD3a, peripheral neuropathy, CAD, history of prostate cancer, venous stasis dermatitis who presented to [facility] ED on 5/28 via EMS for weakness and dyspnea.  Patient reports was unable to get off the toilet with associated shortness of breath and generalized weakness.  Patient recently hospitalized at [facility] long hospital from  5/26 through 5/28 for decompensated OSA/OHS.  During that hospitalization, his BiPAP settings were optimized with a new facemask.  Patient also reports during the hospitalization he had very little mobility which he believes caused deconditioning when he returned home. Pt seen and examined, his sats are 98% on 3lit.  Assessment & Plan:   Principal Problem:   Acute respiratory failure with hypoxia (HCC) Active Problems:   HYPERCHOLESTEROLEMIA   Obesity, Class III, BMI 40-49.9 (morbid obesity) (HCC)   Macrocytic anemia   Obstructive sleep apnea   COPD mixed type (HCC)   Essential hypertension   CKD stage 3 secondary to diabetes (HCC)   Obesity hypoventilation syndrome (HCC)   Acute on chronic respiratory failure with hypoxemia (HCC)   Acute on chronic hypoxic respiratory failure present on admission possibly combination of acute on chronic diastolic heart failure and pneumonia. Chest x-ray showing right basilar airspace opacity on admission, mildly elevated WBC count. He was started on vancomycin and cefepime and currently requiring up to 3 L of nasal cannula  oxygen to keep sats greater than 90% CT chest without contrast ordered,  Moderate right, small left pleural effusions and associated atelectasis or consolidation. Mild interlobular septal thickening throughout the lungs, consistent with mild pulmonary edema.  IR consulted, for thoracentesis, about 700 ml of sero sanguinous fluid taken out and sent for analysis. So far cultures are negative. Sputum cultures negative so far. Cytology is negative for malignant cells. Patient is slightly short of breath while talking this afternoon we will repeat chest x-ray tomorrow    Stage IIIa CKD Creatinine slightly up from baseline 1.3.  Creatinine back at baseline at 1.3 no new complaints.    COPD Continue bronchodilators as needed. No wheezing heard.  Patient uses trilogy at home transition to BiPAP while at the facility On 3 L of oxygen at night at home.   Morbidly obese Recommend outpatient follow-up with PCP patient may benefit from bariatric evaluation as outpatient. Body mass index is 48.97 kg/m.     Obstructive sleep apnea/obesity hypoventilation syndrome Continue with BIPAP and use trilogy at home.    Mild acute on chronic diastolic heart failure Restarted the diuretics. On lasix 40 mg daily.  Continue strict intake and output and daily weights.  Therapy evaluation recommending SNF, currently waiting for equipment to be delivered at SNF to be discharged.     hyperlipidemia:  Resume statin.    Anemia of chronic disease:  Hemoglobin is stable around 10.    Mild thrombocytopenia  No bleeding seen at this time, continue to monitor Unclear etiology  DVT prophylaxis: Eliquis Code Status: Full code full code Family Communication: none at bedside.  Disposition:   Status is: Inpatient  Remains inpatient appropriate because:IV treatments  appropriate due to intensity of illness or inability to take PO   Dispo:  Patient From: Home  Planned Disposition: [place]  Medically stable for discharge: No         Consultants:   None.    Procedures: CT chest without contrast   Antimicrobials: cefepime    Subjective: Shortness of breath has improved but patient is not back to baseline yet  Objective: Vitals:   02/19/21 2135 02/20/21 0210 02/20/21 0443 02/20/21 0815  BP:   111/63 119/65  Pulse: 60 60 61 60  Resp: 20 16 16 16   Temp:   98.7 F (37.1 C) 98.6 F (37 C)  TempSrc:   Axillary Oral  SpO2: 100% 100% 100% 97%  Weight:      Height:        Intake/Output Summary (Last 24 hours) at 02/20/2021 1646 Last data filed at 02/20/2021 0756 Gross per 24 hour  Intake 120 ml  Output 425 ml  Net -305 ml   Filed Weights   02/14/21 1927 02/15/21 0500  Weight: (!) 171.5 kg (!) 173 kg    Examination:  General exam:  Alert and comfortable not in any kind of distress.  Respiratory system: Managed air entry at bases, no wheezing or rhonchi Cardiovascular system: S1-S2 heard, regular rate rhythm, no JVD Gastrointestinal system: Abdomen is soft, nontender nondistended bowel sounds normal Central nervous system: Alert and oriented Extremities: No cyanosis.  Skin: no ulcers seen.  Psychiatry: Mood is appropriate.     Data Reviewed: I have personally reviewed following labs and imaging studies  CBC: Recent Labs  Lab 02/14/21 1935 02/14/21 2120 02/15/21 0858 02/16/21 0149 02/17/21 0312 02/18/21 0239  WBC 10.6*  --  7.7 7.8 6.5 6.5  HGB 12.2* 12.6* 11.3* 11.0* 10.5* 10.4*  HCT 38.4* 37.0* 36.0* 34.3* 33.3* 33.3*  MCV 106.4*  --  106.5* 105.9* 105.4* 106.1*  PLT 131*  --  100* 98* 97* 116*    Basic Metabolic Panel: Recent Labs  Lab 02/15/21 0858 02/16/21 0149 02/17/21 0312 02/18/21 0239 02/20/21 0259  NA 138 138 137 140 140  K 4.0 3.6 3.6 3.6 3.8  CL 98 98 96* 97* 100  CO2 33* 34* 31 35* 32  GLUCOSE 94 109* 101* 96 87  BUN 35* 35* 33* 36* 35*  CREATININE 1.65* 1.52* 1.40* 1.44* 1.37*  CALCIUM 8.5* 8.6* 8.5*  8.6* 8.6*  MG  --  2.1 2.2  --   --     GFR: Estimated Creatinine Clearance: 74.5 mL/min (A) (by C-G formula based on SCr of 1.37 mg/dL (H)).  Liver Function Tests: Recent Labs  Lab 02/15/21 0858  AST 27  ALT 19  ALKPHOS 48  BILITOT 1.3*  PROT 5.3*  ALBUMIN 2.8*    CBG: No results for input(s): GLUCAP in the last 168 hours.   Recent Results (from the past 240 hour(s))  Blood culture (routine single)     Status: None   Collection Time: 02/12/21 12:44 PM   Specimen: Left Antecubital; Blood  Result Value Ref Range Status   Specimen Description   Final    LEFT ANTECUBITAL Performed at [facility], [address], [place], [place] [address]    Special Requests   Final    Blood Culture adequate volume Performed at [facility], [address], [place], [place] [address]    Culture   Final    NO GROWTH 5 DAYS Performed at [facility], [place]  [address] [place], [place] [address]    Report Status 02/17/2021 FINAL  Final  Urine culture     Status: Abnormal   Collection Time: 02/12/21 12:44 PM   Specimen: In/Out Cath Urine  Result Value Ref Range Status   Specimen Description   Final    IN/OUT CATH URINE Performed at [facility], [address] [place], [place] [address]    Special Requests   Final    NONE Performed at [facility], [address] [place], [place] [address]    Culture (A)  Final    10,000 COLONIES/mL AEROCOCCUS URINAE Standardized susceptibility testing for this organism is not available. Performed at [facility], [place] [address] [place], [place] [address]    Report Status 02/16/2021 FINAL  Final  Resp Panel by RT-PCR (Flu A&B, Covid)     Status: None   Collection Time: 02/12/21 12:54 PM   Specimen: Nasopharyngeal(NP) swabs in vial transport medium  Result Value Ref Range Status   SARS Coronavirus 2 by RT PCR NEGATIVE NEGATIVE Final    Comment:  (NOTE) SARS-CoV-2 target nucleic acids are NOT DETECTED.  The SARS-CoV-2 RNA is generally detectable in upper respiratory specimens during the acute phase of infection. The lowest concentration of SARS-CoV-2 viral copies this assay can detect is 138 copies/mL. A negative result does not preclude SARS-Cov-2 infection and should not be used as the sole basis for treatment or other patient management decisions. A negative result may occur with  improper specimen collection/handling, submission of specimen other than nasopharyngeal swab, presence of viral mutation(s) within the areas targeted by this assay, and inadequate number of viral copies(<138 copies/mL). A negative result must be combined with clinical observations, patient history, and epidemiological information. The expected result is Negative.  Fact Sheet for Patients:  EntrepreneurPulse.com.au  Fact Sheet for Healthcare Providers:  IncredibleEmployment.be  This test is no t yet approved or cleared by the [place] FDA and  has been authorized for detection and/or diagnosis of SARS-CoV-2 by FDA under an Emergency Use Authorization (EUA). This EUA will remain  in effect (meaning this test can be used) for the duration of the COVID-19 declaration under Section 564(b)(1) of the Act, 21 U.S.C.section 360bbb-3(b)(1), unless the authorization is terminated  or revoked sooner.       Influenza A by PCR NEGATIVE NEGATIVE Final   Influenza B by PCR NEGATIVE NEGATIVE Final    Comment: (NOTE) The Xpert Xpress SARS-CoV-2/FLU/RSV plus assay is intended as an aid in the diagnosis of influenza from Nasopharyngeal swab specimens and should not be used as a sole basis for treatment. Nasal washings and aspirates are unacceptable for Xpert Xpress SARS-CoV-2/FLU/RSV testing.  Fact Sheet for Patients: EntrepreneurPulse.com.au  Fact Sheet for Healthcare  Providers: IncredibleEmployment.be  This test is not yet approved or cleared by the [place] FDA and has been authorized for detection and/or diagnosis of SARS-CoV-2 by FDA under an Emergency Use Authorization (EUA). This EUA will remain in effect (meaning this test can be used) for the duration of the COVID-19 declaration under Section 564(b)(1) of the Act, 21 U.S.C. section 360bbb-3(b)(1), unless the authorization is terminated or revoked.  Performed at [facility], [address], [place], [place] [address]   MRSA PCR Screening     Status: None   Collection Time: 02/12/21  5:55 PM   Specimen: Nasopharyngeal  Result Value Ref Range Status   MRSA by PCR NEGATIVE NEGATIVE Final    Comment:  The GeneXpert MRSA Assay (FDA approved for NASAL specimens only), is one component of a comprehensive MRSA colonization surveillance program. It is not intended to diagnose MRSA infection nor to guide or monitor treatment for MRSA infections. Performed at [facility], [place] [address], [place], [place] [address]   Resp Panel by RT-PCR (Flu A&B, Covid) Nasopharyngeal Swab     Status: None   Collection Time: 02/14/21  7:40 PM   Specimen: Nasopharyngeal Swab; Nasopharyngeal(NP) swabs in vial transport medium  Result Value Ref Range Status   SARS Coronavirus 2 by RT PCR NEGATIVE NEGATIVE Final    Comment: (NOTE) SARS-CoV-2 target nucleic acids are NOT DETECTED.  The SARS-CoV-2 RNA is generally detectable in upper respiratory specimens during the acute phase of infection. The lowest concentration of SARS-CoV-2 viral copies this assay can detect is 138 copies/mL. A negative result does not preclude SARS-Cov-2 infection and should not be used as the sole basis for treatment or other patient management decisions. A negative result may occur with  improper specimen collection/handling, submission of specimen other than  nasopharyngeal swab, presence of viral mutation(s) within the areas targeted by this assay, and inadequate number of viral copies(<138 copies/mL). A negative result must be combined with clinical observations, patient history, and epidemiological information. The expected result is Negative.  Fact Sheet for Patients:  EntrepreneurPulse.com.au  Fact Sheet for Healthcare Providers:  IncredibleEmployment.be  This test is no t yet approved or cleared by the [place] FDA and  has been authorized for detection and/or diagnosis of SARS-CoV-2 by FDA under an Emergency Use Authorization (EUA). This EUA will remain  in effect (meaning this test can be used) for the duration of the COVID-19 declaration under Section 564(b)(1) of the Act, 21 U.S.C.section 360bbb-3(b)(1), unless the authorization is terminated  or revoked sooner.       Influenza A by PCR NEGATIVE NEGATIVE Final   Influenza B by PCR NEGATIVE NEGATIVE Final    Comment: (NOTE) The Xpert Xpress SARS-CoV-2/FLU/RSV plus assay is intended as an aid in the diagnosis of influenza from Nasopharyngeal swab specimens and should not be used as a sole basis for treatment. Nasal washings and aspirates are unacceptable for Xpert Xpress SARS-CoV-2/FLU/RSV testing.  Fact Sheet for Patients: EntrepreneurPulse.com.au  Fact Sheet for Healthcare Providers: IncredibleEmployment.be  This test is not yet approved or cleared by the [place] FDA and has been authorized for detection and/or diagnosis of SARS-CoV-2 by FDA under an Emergency Use Authorization (EUA). This EUA will remain in effect (meaning this test can be used) for the duration of the COVID-19 declaration under Section 564(b)(1) of the Act, 21 U.S.C. section 360bbb-3(b)(1), unless the authorization is terminated or revoked.  Performed at [facility], [place] [address] [place], [place] [address]    Blood culture (routine x 2)     Status: None   Collection Time: 02/14/21  7:40 PM   Specimen: BLOOD LEFT WRIST  Result Value Ref Range Status   Specimen Description BLOOD LEFT WRIST  Final   Special Requests   Final    BOTTLES DRAWN AEROBIC AND ANAEROBIC Blood Culture adequate volume   Culture   Final    NO GROWTH 5 DAYS Performed at [facility], [place] [address] [place], [place] [address]    Report Status 02/19/2021 FINAL  Final  Blood culture (routine x 2)     Status: None   Collection Time: 02/15/21  1:20 AM   Specimen: BLOOD  Result Value  Ref Range Status   Specimen Description BLOOD SITE NOT SPECIFIED  Final   Special Requests   Final    BOTTLES DRAWN AEROBIC AND ANAEROBIC Blood Culture adequate volume   Culture   Final    NO GROWTH 5 DAYS Performed at [facility], [address] [place], [place] [address]    Report Status 02/20/2021 FINAL  Final  MRSA PCR Screening     Status: None   Collection Time: 02/15/21  8:36 AM   Specimen: Nasopharyngeal  Result Value Ref Range Status   MRSA by PCR NEGATIVE NEGATIVE Final    Comment:        The GeneXpert MRSA Assay (FDA approved for NASAL specimens only), is one component of a comprehensive MRSA colonization surveillance program. It is not intended to diagnose MRSA infection nor to guide or monitor treatment for MRSA infections. Performed at [facility], [place] [address], [place], [place] [address]   Body fluid culture w Gram Stain     Status: None (Preliminary result)   Collection Time: 02/18/21 10:34 AM   Specimen: Lung, Right; Pleural Fluid  Result Value Ref Range Status   Specimen Description PLEURAL FLUID  Final   Special Requests LUNG RIGHT  Final   Gram Stain   Final    ABUNDANT WBC PRESENT,BOTH PMN AND MONONUCLEAR NO ORGANISMS SEEN    Culture   Final    NO GROWTH 2 DAYS Performed at [facility], [place] [address], [place], [place] [address]    Report Status PENDING  Incomplete          Radiology Studies: No results found.      Scheduled Meds: . allopurinol  100 mg Oral Daily  . apixaban  5 mg Oral BID  . vitamin C  500 mg Oral Daily  . atorvastatin  80 mg Oral Daily  . famotidine  40 mg Oral Daily  . fluticasone furoate-vilanterol  1 puff Inhalation Daily   And  . umeclidinium bromide  1 puff Inhalation Daily  . furosemide  40 mg Oral Daily  . gabapentin  300 mg Oral BID  . polyethylene glycol  17 g Oral Daily   Continuous Infusions: . ceFEPime (MAXIPIME) IV 2 g (02/20/21 1609)     LOS: 5 days        Hosie Poisson, MD [facility]   To contact the attending provider between 7A-7P or the covering provider during after hours 7P-7A, please log into the web site [email or web address] and access using universal [place] password for that web site. If you do not have the password, please call the hospital operator.  02/20/2021, 4:46 PM

## 2021-02-20 NOTE — TOC Progression Note (Addendum)
Transition of Care [facility]) - Progression Note    Patient Details  Name: Arnaldo Heffron MRN: [id number] Date of Birth: Dec 09, 1940  Transition of [facility]) CM/SW [place], [place] Phone Number: 02/20/2021, 11:12 AM  Clinical Narrative:      Update- CSW received call from [facility] with [facility]. Misty confirmed that supply is currently out for Bipap. Misty confirmed with CSW to check back in on Monday. CSW updated patient at bedside. Misty with [facility] confirmed Josem Kaufmann will be on pause until bipap is available. CSW will continue to follow and assist with discharge planning needs. CSW spoke with Misty with [facility] and confirmed with them that patient will need Bipap ordered. CSW gave [place] with [facility] Respiratory settings. Misty also confirmed she will review respiratory note. Misty is going to order Bipap and call CSW back and let CSW to give determination on when Bipap may arrive at [facility]. Misty confirmed that they received insurance authorization approval for patient. CSW will continue to follow and assist with discharge planning needs.    Expected Discharge Plan: [place] Barriers to Discharge: Continued Medical Work up  Expected Discharge Plan and Services Expected Discharge Plan: [place] In-house Referral: Clinical Social Work     Living arrangements for the past 2 months: Single Family Home                                       Social Determinants of Health (SDOH) Interventions    Readmission Risk Interventions No flowsheet data found.

## 2021-02-20 NOTE — Care Management Important Message (Signed)
Important Message  Patient Details  Name: [name] MRN: [id number] Date of Birth: 1941-06-02   Medicare Important Message Given:  Yes     Shelda Altes 02/20/2021, 10:05 AM

## 2021-02-21 ENCOUNTER — Inpatient Hospital Stay (HOSPITAL_COMMUNITY): Payer: Medicare HMO

## 2021-02-21 DIAGNOSIS — I517 Cardiomegaly: Secondary | ICD-10-CM | POA: Diagnosis not present

## 2021-02-21 DIAGNOSIS — J9621 Acute and chronic respiratory failure with hypoxia: Secondary | ICD-10-CM | POA: Diagnosis not present

## 2021-02-21 DIAGNOSIS — E1122 Type 2 diabetes mellitus with diabetic chronic kidney disease: Secondary | ICD-10-CM | POA: Diagnosis not present

## 2021-02-21 DIAGNOSIS — J449 Chronic obstructive pulmonary disease, unspecified: Secondary | ICD-10-CM | POA: Diagnosis not present

## 2021-02-21 DIAGNOSIS — J90 Pleural effusion, not elsewhere classified: Secondary | ICD-10-CM | POA: Diagnosis not present

## 2021-02-21 DIAGNOSIS — N183 Chronic kidney disease, stage 3 unspecified: Secondary | ICD-10-CM | POA: Diagnosis not present

## 2021-02-21 DIAGNOSIS — R0602 Shortness of breath: Secondary | ICD-10-CM | POA: Diagnosis not present

## 2021-02-21 DIAGNOSIS — J9601 Acute respiratory failure with hypoxia: Secondary | ICD-10-CM | POA: Diagnosis not present

## 2021-02-21 LAB — BASIC METABOLIC PANEL
Anion gap: 8 (ref 5–15)
BUN: 36 mg/dL — ABNORMAL HIGH (ref 8–23)
CO2: 31 mmol/L (ref 22–32)
Calcium: 8.5 mg/dL — ABNORMAL LOW (ref 8.9–10.3)
Chloride: 101 mmol/L (ref 98–111)
Creatinine, Ser: 1.46 mg/dL — ABNORMAL HIGH (ref 0.61–1.24)
GFR, Estimated: 49 mL/min — ABNORMAL LOW (ref >60)
Glucose, Bld: 86 mg/dL (ref 70–99)
Potassium: 3.8 mmol/L (ref 3.5–5.1)
Sodium: 140 mmol/L (ref 135–145)

## 2021-02-21 LAB — BODY FLUID CULTURE W GRAM STAIN: Culture: NO GROWTH

## 2021-02-21 MED ORDER — FUROSEMIDE 10 MG/ML IJ SOLN
40.0000 mg | Freq: Every day | INTRAMUSCULAR | Status: DC
  Administered 2021-02-21 – 2021-02-22 (×2): 40 mg via INTRAVENOUS
  Filled 2021-02-21 (×2): qty 4

## 2021-02-21 NOTE — Progress Notes (Signed)
PROGRESS NOTE    [name]  XYI:[id number] DOB: 05/15/1941 DOA: 02/14/2021 PCP: [name], MD    Chief Complaint  Patient presents with  . Weakness  . Shortness of Breath    Brief Narrative:  [name] is a 80 year old male with past medical history significant for paroxysmal atrial fibrillation, aortic stenosis s/p TAVR, chronic diastolic congestive heart failure, degenerative disc disease, gout, hyperlipidemia, hypertension, morbid obesity, OSA/hypoventilation syndrome, OA, CKD3a, peripheral neuropathy, CAD, history of prostate cancer, venous stasis dermatitis who presented to [facility] ED on 5/28 via EMS for weakness and dyspnea.  Patient reports was unable to get off the toilet with associated shortness of breath and generalized weakness.  Patient recently hospitalized at [facility] hospital from  5/26 through 5/28 for decompensated OSA/OHS.  During that hospitalization, his BiPAP settings were optimized with a new facemask.  Patient also reports during the hospitalization he had very little mobility which he believes caused deconditioning when he returned home. Pt seen and examined, his sats are 98% on 3lit. Repeat CXR showed recurrence of moderate right sided effusion.   Assessment & Plan:   Principal Problem:   Acute respiratory failure with hypoxia (HCC) Active Problems:   HYPERCHOLESTEROLEMIA   Obesity, Class III, BMI 40-49.9 (morbid obesity) (HCC)   Macrocytic anemia   Obstructive sleep apnea   COPD mixed type (HCC)   Essential hypertension   CKD stage 3 secondary to diabetes (HCC)   Obesity hypoventilation syndrome (HCC)   Acute on chronic respiratory failure with hypoxemia (HCC)   Acute on chronic hypoxic respiratory failure present on admission possibly combination of acute on chronic diastolic heart failure and pneumonia. Chest x-ray showing right basilar airspace opacity on admission, mildly elevated WBC count. He was started on vancomycin  and cefepime and currently requiring up to 3 L of nasal cannula oxygen to keep sats greater than 90%. Completed 7 days of IV cefepime.  CT chest without contrast ordered,  Moderate right, small left pleural effusions and associated atelectasis or consolidation. Mild interlobular septal thickening throughout the lungs, consistent with mild pulmonary edema.  IR consulted, for thoracentesis, about 700 ml of sero sanguinous fluid taken out and sent for analysis. So far cultures are negative. Sputum cultures negative so far. Cytology is negative for malignant cells. Repeat CXR shows moderate effusion on the right.      Stage IIIa CKD Creatinine slightly up from baseline 1.3.  Creatinine at 1.4 ,continue to monitor.     COPD Continue bronchodilators as needed. No wheezing heard.   Patient uses trilogy at home transition to BiPAP while at the facility On 3 L of oxygen at night at home.   Morbidly obese Recommend outpatient follow-up with PCP patient may benefit from bariatric evaluation as outpatient. Body mass index is 49.45 kg/m.     Obstructive sleep apnea/obesity hypoventilation syndrome Continue with BIPAP and use trilogy at home.    Mild acute on chronic diastolic heart failure Restarted the diuretics, will change to IV lasix today as repeat CXR shows moderate effusion on the right.  Continue strict intake and output and daily weights.  Therapy evaluation recommending SNF, currently waiting for equipment to be delivered at SNF to be discharged.     hyperlipidemia:  Resume statin.    Anemia of chronic disease:  Hemoglobin is stable around 10.    Mild thrombocytopenia  No bleeding seen at this time, continue to monitor Unclear etiology  DVT prophylaxis: Eliquis Code Status: Full code  full code Family Communication: family at bedside.  Disposition:   Status is: Inpatient  Remains inpatient appropriate because:IV treatments appropriate due to intensity of illness  or inability to take PO   Dispo:  Patient From: Home  Planned Disposition: [place]  Medically stable for discharge: No         Consultants:   None.    Procedures: CT chest without contrast   Antimicrobials: cefepime    Subjective: Pt reports some sob on talking. Requesting for weighing machine and braces for the knee.   Objective: Vitals:   02/20/21 0815 02/20/21 2119 02/20/21 2310 02/21/21 0536  BP: 119/65 (!) 156/114  139/69  Pulse: 60 62 61 61  Resp: 16 20 18 18   Temp: 98.6 F (37 C) 98.5 F (36.9 C)  97.6 F (36.4 C)  TempSrc: Oral Oral  Axillary  SpO2: 97% 99% 98% 100%  Weight:    (!) 174.7 kg  Height:        Intake/Output Summary (Last 24 hours) at 02/21/2021 1543 Last data filed at 02/21/2021 0815 Gross per 24 hour  Intake 100 ml  Output 300 ml  Net -200 ml   Filed Weights   02/14/21 1927 02/15/21 0500 02/21/21 0536  Weight: (!) 171.5 kg (!) 173 kg (!) 174.7 kg    Examination:  General exam:  Morbidly obese gentleman, on 3 lit of [place] oxygen. Not in distress.  Respiratory system: air entry at bases diminished, no wheezing or rhonchi.  Cardiovascular system: S1-S2 heard, regular rate rhythm, no JVD, pedal edema present Gastrointestinal system: Abdomen is soft, NT ND BS+ Central nervous system: Alert and oriented,  Extremities: bilateral 3+ leg edema.  Skin: no ulcers seen.  Psychiatry: Mood is appropriate.     Data Reviewed: I have personally reviewed following labs and imaging studies  CBC: Recent Labs  Lab 02/14/21 1935 02/14/21 2120 02/15/21 0858 02/16/21 0149 02/17/21 0312 02/18/21 0239  WBC 10.6*  --  7.7 7.8 6.5 6.5  HGB 12.2* 12.6* 11.3* 11.0* 10.5* 10.4*  HCT 38.4* 37.0* 36.0* 34.3* 33.3* 33.3*  MCV 106.4*  --  106.5* 105.9* 105.4* 106.1*  PLT 131*  --  100* 98* 97* 116*    Basic Metabolic Panel: Recent Labs  Lab 02/16/21 0149 02/17/21 0312 02/18/21 0239 02/20/21 0259 02/21/21 0245  NA 138 137 140  140 140  K 3.6 3.6 3.6 3.8 3.8  CL 98 96* 97* 100 101  CO2 34* 31 35* 32 31  GLUCOSE 109* 101* 96 87 86  BUN 35* 33* 36* 35* 36*  CREATININE 1.52* 1.40* 1.44* 1.37* 1.46*  CALCIUM 8.6* 8.5* 8.6* 8.6* 8.5*  MG 2.1 2.2  --   --   --     GFR: Estimated Creatinine Clearance: 70.3 mL/min (A) (by C-G formula based on SCr of 1.46 mg/dL (H)).  Liver Function Tests: Recent Labs  Lab 02/15/21 0858  AST 27  ALT 19  ALKPHOS 48  BILITOT 1.3*  PROT 5.3*  ALBUMIN 2.8*    CBG: No results for input(s): GLUCAP in the last 168 hours.   Recent Results (from the past 240 hour(s))  Blood culture (routine single)     Status: None   Collection Time: 02/12/21 12:44 PM   Specimen: Left Antecubital; Blood  Result Value Ref Range Status   Specimen Description   Final    LEFT ANTECUBITAL Performed at [facility], [address] [place], [place] [address]    Special Requests  Final    Blood Culture adequate volume Performed at [facility], [address] [place], [place] [address]    Culture   Final    NO GROWTH 5 DAYS Performed at [facility], [place] [address], [place], [place] [address]    Report Status 02/17/2021 FINAL  Final  Urine culture     Status: Abnormal   Collection Time: 02/12/21 12:44 PM   Specimen: In/Out Cath Urine  Result Value Ref Range Status   Specimen Description   Final    IN/OUT CATH URINE Performed at [facility], [address], [place], [place] [address]    Special Requests   Final    NONE Performed at [facility], [address] [place], [place] [address]    Culture (A)  Final    10,000 COLONIES/mL AEROCOCCUS URINAE Standardized susceptibility testing for this organism is not available. Performed at [facility], [place] [address], [place], [place] [address]    Report Status 02/16/2021 FINAL  Final  Resp Panel by RT-PCR (Flu A&B, Covid)     Status: None    Collection Time: 02/12/21 12:54 PM   Specimen: Nasopharyngeal(NP) swabs in vial transport medium  Result Value Ref Range Status   SARS Coronavirus 2 by RT PCR NEGATIVE NEGATIVE Final    Comment: (NOTE) SARS-CoV-2 target nucleic acids are NOT DETECTED.  The SARS-CoV-2 RNA is generally detectable in upper respiratory specimens during the acute phase of infection. The lowest concentration of SARS-CoV-2 viral copies this assay can detect is 138 copies/mL. A negative result does not preclude SARS-Cov-2 infection and should not be used as the sole basis for treatment or other patient management decisions. A negative result may occur with  improper specimen collection/handling, submission of specimen other than nasopharyngeal swab, presence of viral mutation(s) within the areas targeted by this assay, and inadequate number of viral copies(<138 copies/mL). A negative result must be combined with clinical observations, patient history, and epidemiological information. The expected result is Negative.  Fact Sheet for Patients:  EntrepreneurPulse.com.au  Fact Sheet for Healthcare Providers:  IncredibleEmployment.be  This test is no t yet approved or cleared by the [place] FDA and  has been authorized for detection and/or diagnosis of SARS-CoV-2 by FDA under an Emergency Use Authorization (EUA). This EUA will remain  in effect (meaning this test can be used) for the duration of the COVID-19 declaration under Section 564(b)(1) of the Act, 21 U.S.C.section 360bbb-3(b)(1), unless the authorization is terminated  or revoked sooner.       Influenza A by PCR NEGATIVE NEGATIVE Final   Influenza B by PCR NEGATIVE NEGATIVE Final    Comment: (NOTE) The Xpert Xpress SARS-CoV-2/FLU/RSV plus assay is intended as an aid in the diagnosis of influenza from Nasopharyngeal swab specimens and should not be used as a sole basis for treatment. Nasal washings  and aspirates are unacceptable for Xpert Xpress SARS-CoV-2/FLU/RSV testing.  Fact Sheet for Patients: EntrepreneurPulse.com.au  Fact Sheet for Healthcare Providers: IncredibleEmployment.be  This test is not yet approved or cleared by the [place] FDA and has been authorized for detection and/or diagnosis of SARS-CoV-2 by FDA under an Emergency Use Authorization (EUA). This EUA will remain in effect (meaning this test can be used) for the duration of the COVID-19 declaration under Section 564(b)(1) of the Act, 21 U.S.C. section 360bbb-3(b)(1), unless the authorization is terminated or revoked.  Performed at [facility], [address] [place], [place] [address]  MRSA PCR Screening     Status: None   Collection Time: 02/12/21  5:55 PM   Specimen: Nasopharyngeal  Result Value Ref Range Status   MRSA by PCR NEGATIVE NEGATIVE Final    Comment:        The GeneXpert MRSA Assay (FDA approved for NASAL specimens only), is one component of a comprehensive MRSA colonization surveillance program. It is not intended to diagnose MRSA infection nor to guide or monitor treatment for MRSA infections. Performed at [facility], [place] [address] [place], [place] [address]   Resp Panel by RT-PCR (Flu A&B, Covid) Nasopharyngeal Swab     Status: None   Collection Time: 02/14/21  7:40 PM   Specimen: Nasopharyngeal Swab; Nasopharyngeal(NP) swabs in vial transport medium  Result Value Ref Range Status   SARS Coronavirus 2 by RT PCR NEGATIVE NEGATIVE Final    Comment: (NOTE) SARS-CoV-2 target nucleic acids are NOT DETECTED.  The SARS-CoV-2 RNA is generally detectable in upper respiratory specimens during the acute phase of infection. The lowest concentration of SARS-CoV-2 viral copies this assay can detect is 138 copies/mL. A negative result does not preclude SARS-Cov-2 infection and should not be used as the  sole basis for treatment or other patient management decisions. A negative result may occur with  improper specimen collection/handling, submission of specimen other than nasopharyngeal swab, presence of viral mutation(s) within the areas targeted by this assay, and inadequate number of viral copies(<138 copies/mL). A negative result must be combined with clinical observations, patient history, and epidemiological information. The expected result is Negative.  Fact Sheet for Patients:  EntrepreneurPulse.com.au  Fact Sheet for Healthcare Providers:  IncredibleEmployment.be  This test is no t yet approved or cleared by the [place] FDA and  has been authorized for detection and/or diagnosis of SARS-CoV-2 by FDA under an Emergency Use Authorization (EUA). This EUA will remain  in effect (meaning this test can be used) for the duration of the COVID-19 declaration under Section 564(b)(1) of the Act, 21 U.S.C.section 360bbb-3(b)(1), unless the authorization is terminated  or revoked sooner.       Influenza A by PCR NEGATIVE NEGATIVE Final   Influenza B by PCR NEGATIVE NEGATIVE Final    Comment: (NOTE) The Xpert Xpress SARS-CoV-2/FLU/RSV plus assay is intended as an aid in the diagnosis of influenza from Nasopharyngeal swab specimens and should not be used as a sole basis for treatment. Nasal washings and aspirates are unacceptable for Xpert Xpress SARS-CoV-2/FLU/RSV testing.  Fact Sheet for Patients: EntrepreneurPulse.com.au  Fact Sheet for Healthcare Providers: IncredibleEmployment.be  This test is not yet approved or cleared by the [place] FDA and has been authorized for detection and/or diagnosis of SARS-CoV-2 by FDA under an Emergency Use Authorization (EUA). This EUA will remain in effect (meaning this test can be used) for the duration of the COVID-19 declaration under Section 564(b)(1) of the  Act, 21 U.S.C. section 360bbb-3(b)(1), unless the authorization is terminated or revoked.  Performed at [facility], [place] [address], [place],  [address]   Blood culture (routine x 2)     Status: None   Collection Time: 02/14/21  7:40 PM   Specimen: BLOOD LEFT WRIST  Result Value Ref Range Status   Specimen Description BLOOD LEFT WRIST  Final   Special Requests   Final    BOTTLES DRAWN AEROBIC AND ANAEROBIC Blood Culture adequate volume   Culture   Final    NO GROWTH 5 DAYS Performed at [facility], [place] [address] [place], [place] [address]    Report Status 02/19/2021 FINAL  Final  Blood culture (routine x 2)     Status: None   Collection Time: 02/15/21  1:20 AM   Specimen: BLOOD  Result Value Ref Range Status   Specimen Description BLOOD SITE NOT SPECIFIED  Final   Special Requests   Final    BOTTLES DRAWN AEROBIC AND ANAEROBIC Blood Culture adequate volume   Culture   Final    NO GROWTH 5 DAYS Performed at [facility], [address], [place], [place] [address]    Report Status 02/20/2021 FINAL  Final  MRSA PCR Screening     Status: None   Collection Time: 02/15/21  8:36 AM   Specimen: Nasopharyngeal  Result Value Ref Range Status   MRSA by PCR NEGATIVE NEGATIVE Final    Comment:        The GeneXpert MRSA Assay (FDA approved for NASAL specimens only), is one component of a comprehensive MRSA colonization surveillance program. It is not intended to diagnose MRSA infection nor to guide or monitor treatment for MRSA infections. Performed at [facility], [place] [address], [place], [place] [address]   Body fluid culture w Gram Stain     Status: None   Collection Time: 02/18/21 10:34 AM   Specimen: Lung, Right; Pleural Fluid  Result Value Ref Range Status   Specimen Description PLEURAL FLUID  Final   Special Requests LUNG RIGHT  Final   Gram Stain   Final    ABUNDANT WBC PRESENT,BOTH PMN AND MONONUCLEAR NO ORGANISMS SEEN     Culture   Final    NO GROWTH 3 DAYS Performed at [facility], [place] [address] [place], [place] [address]    Report Status 02/21/2021 FINAL  Final         Radiology Studies: DG CHEST PORT 1 VIEW  Result Date: 02/21/2021 CLINICAL DATA:  Short of breath. Paracentesis today. Concern for pneumothorax EXAM: PORTABLE CHEST 1 VIEW COMPARISON:  02/18/2021 FINDINGS: Stable enlarged cardiac silhouette. There is moderate RIGHT effusion unchanged. No pneumothorax. Pacer noted. IMPRESSION: No pneumothorax. Cardiomegaly, low lung volumes, and pleural effusion. Electronically Signed   By: Suzy Bouchard M.D.   On: 02/21/2021 09:12        Scheduled Meds: . allopurinol  100 mg Oral Daily  . apixaban  5 mg Oral BID  . vitamin C  500 mg Oral Daily  . atorvastatin  80 mg Oral Daily  . famotidine  40 mg Oral Daily  . fluticasone furoate-vilanterol  1 puff Inhalation Daily   And  . umeclidinium bromide  1 puff Inhalation Daily  . furosemide  40 mg Intravenous Daily  . gabapentin  300 mg Oral BID  . polyethylene glycol  17 g Oral Daily   Continuous Infusions: . ceFEPime (MAXIPIME) IV 2 g (02/21/21 0816)     LOS: 6 days        Hosie Poisson, MD [facility]   To contact the attending provider between 7A-7P or the covering provider during after hours 7P-7A, please log into the web site [email or web address] and access using universal [place] password for that web site. If you do not have the password, please call the hospital operator.  02/21/2021, 3:43 PM

## 2021-02-21 NOTE — Progress Notes (Signed)
RT placed patient on home Trilogy BIPAP with 4L O2 bled into BIPAP. Patient tolerating well at this time.

## 2021-02-21 NOTE — Progress Notes (Signed)
RT placed patient on his home trilogy bipap hs. Patient tolerating well at this time

## 2021-02-22 ENCOUNTER — Inpatient Hospital Stay (HOSPITAL_COMMUNITY): Payer: Medicare HMO

## 2021-02-22 DIAGNOSIS — J449 Chronic obstructive pulmonary disease, unspecified: Secondary | ICD-10-CM | POA: Diagnosis not present

## 2021-02-22 DIAGNOSIS — J9621 Acute and chronic respiratory failure with hypoxia: Secondary | ICD-10-CM | POA: Diagnosis not present

## 2021-02-22 DIAGNOSIS — J9601 Acute respiratory failure with hypoxia: Secondary | ICD-10-CM | POA: Diagnosis not present

## 2021-02-22 DIAGNOSIS — E1122 Type 2 diabetes mellitus with diabetic chronic kidney disease: Secondary | ICD-10-CM | POA: Diagnosis not present

## 2021-02-22 DIAGNOSIS — R4182 Altered mental status, unspecified: Secondary | ICD-10-CM | POA: Diagnosis not present

## 2021-02-22 DIAGNOSIS — N183 Chronic kidney disease, stage 3 unspecified: Secondary | ICD-10-CM | POA: Diagnosis not present

## 2021-02-22 LAB — BASIC METABOLIC PANEL
Anion gap: 9 (ref 5–15)
Anion gap: 8 (ref 5–15)
BUN: 35 mg/dL — ABNORMAL HIGH (ref 8–23)
BUN: 34 mg/dL — ABNORMAL HIGH (ref 8–23)
CO2: 31 mmol/L (ref 22–32)
CO2: 34 mmol/L — ABNORMAL HIGH (ref 22–32)
Calcium: 8.6 mg/dL — ABNORMAL LOW (ref 8.9–10.3)
Calcium: 9 mg/dL (ref 8.9–10.3)
Chloride: 100 mmol/L (ref 98–111)
Chloride: 99 mmol/L (ref 98–111)
Creatinine, Ser: 1.61 mg/dL — ABNORMAL HIGH (ref 0.61–1.24)
Creatinine, Ser: 1.59 mg/dL — ABNORMAL HIGH (ref 0.61–1.24)
GFR, Estimated: 44 mL/min — ABNORMAL LOW (ref >60)
GFR, Estimated: 44 mL/min — ABNORMAL LOW (ref >60)
Glucose, Bld: 91 mg/dL (ref 70–99)
Glucose, Bld: 115 mg/dL — ABNORMAL HIGH (ref 70–99)
Potassium: 3.8 mmol/L (ref 3.5–5.1)
Potassium: 3.9 mmol/L (ref 3.5–5.1)
Sodium: 140 mmol/L (ref 135–145)
Sodium: 141 mmol/L (ref 135–145)

## 2021-02-22 LAB — CBC WITH DIFFERENTIAL/PLATELET
Abs Immature Granulocytes: 0.03 10*3/uL (ref 0.00–0.07)
Basophils Absolute: 0 10*3/uL (ref 0.0–0.1)
Basophils Relative: 1 %
Eosinophils Absolute: 0.2 10*3/uL (ref 0.0–0.5)
Eosinophils Relative: 3 %
HCT: 34.3 % — ABNORMAL LOW (ref 39.0–52.0)
Hemoglobin: 10.9 g/dL — ABNORMAL LOW (ref 13.0–17.0)
Immature Granulocytes: 1 %
Lymphocytes Relative: 16 %
Lymphs Abs: 0.9 10*3/uL (ref 0.7–4.0)
MCH: 33 pg (ref 26.0–34.0)
MCHC: 31.8 g/dL (ref 30.0–36.0)
MCV: 103.9 fL — ABNORMAL HIGH (ref 80.0–100.0)
Monocytes Absolute: 0.5 10*3/uL (ref 0.1–1.0)
Monocytes Relative: 9 %
Neutro Abs: 4.3 10*3/uL (ref 1.7–7.7)
Neutrophils Relative %: 70 %
Platelets: 161 10*3/uL (ref 150–400)
RBC: 3.3 MIL/uL — ABNORMAL LOW (ref 4.22–5.81)
RDW: 17.2 % — ABNORMAL HIGH (ref 11.5–15.5)
WBC: 6 10*3/uL (ref 4.0–10.5)
nRBC: 0 % (ref 0.0–0.2)

## 2021-02-22 LAB — MAGNESIUM: Magnesium: 2.1 mg/dL (ref 1.7–2.4)

## 2021-02-22 LAB — GLUCOSE, CAPILLARY: Glucose-Capillary: 116 mg/dL — ABNORMAL HIGH (ref 70–99)

## 2021-02-22 LAB — PHOSPHORUS: Phosphorus: 4 mg/dL (ref 2.5–4.6)

## 2021-02-22 MED ORDER — FUROSEMIDE 40 MG PO TABS
40.0000 mg | ORAL_TABLET | Freq: Every day | ORAL | Status: DC
  Administered 2021-02-22 – 2021-02-24 (×3): 40 mg via ORAL
  Filled 2021-02-22 (×3): qty 1

## 2021-02-22 NOTE — Progress Notes (Signed)
Pt placed on home trilogy machine. 3L of 02 bleed in.  Pt tolerating well.

## 2021-02-22 NOTE — Progress Notes (Signed)
Family called nurse to room to say patient was having increased tremors and unable to form words as well as previously. Vital signs within normal limits. Pt. oriented X 4. Messaged Dr. Karleen Hampshire.

## 2021-02-22 NOTE — Progress Notes (Addendum)
PROGRESS NOTE    [name]  RDE:[id number] DOB: 13-Oct-1940 DOA: 02/14/2021 PCP: [name], MD    Chief Complaint  Patient presents with  . Weakness  . Shortness of Breath    Brief Narrative:  [name] is a 80 year old male with past medical history significant for paroxysmal atrial fibrillation, aortic stenosis s/p TAVR, chronic diastolic congestive heart failure, degenerative disc disease, gout, hyperlipidemia, hypertension, morbid obesity, OSA/hypoventilation syndrome, OA, CKD3a, peripheral neuropathy, CAD, history of prostate cancer, venous stasis dermatitis who presented to [facility] ED on 5/28 via EMS for weakness and dyspnea.  Patient reports was unable to get off the toilet with associated shortness of breath and generalized weakness.  Patient recently hospitalized at [facility] long hospital from  5/26 through 5/28 for decompensated OSA/OHS.  During that hospitalization, his BiPAP settings were optimized with a new facemask.  Patient also reports during the hospitalization he had very little mobility which he believes caused deconditioning when he returned home. his sats are 98% on 3lit. Repeat CXR showed recurrence of moderate right sided effusion. restared him on IV lasix,but his creatinine worsened from 1.4 to 1.6,. switched it back to oral lasix.   Assessment & Plan:   Principal Problem:   Acute respiratory failure with hypoxia (HCC) Active Problems:   HYPERCHOLESTEROLEMIA   Obesity, Class III, BMI 40-49.9 (morbid obesity) (HCC)   Macrocytic anemia   Obstructive sleep apnea   COPD mixed type (HCC)   Essential hypertension   CKD stage 3 secondary to diabetes (HCC)   Obesity hypoventilation syndrome (HCC)   Acute on chronic respiratory failure with hypoxemia (HCC)   Acute on chronic hypoxic respiratory failure present on admission possibly combination of acute on chronic diastolic heart failure and pneumonia. Chest x-ray showing right basilar  airspace opacity on admission, mildly elevated WBC count. He was started on vancomycin and cefepime and currently requiring up to 3 L of nasal cannula oxygen to keep sats greater than 90%. Completed 7 days of IV cefepime.  CT chest without contrast ordered,  Moderate right, small left pleural effusions and associated atelectasis or consolidation. Mild interlobular septal thickening throughout the lungs, consistent with mild pulmonary edema.  IR consulted, for thoracentesis, about 700 ml of sero sanguinous fluid taken out and sent for analysis. So far cultures are negative. Sputum cultures negative so far. Cytology is negative for malignant cells. Repeat CXR shows moderate effusion on the right.  Pt is on RA today with good sats.      Acute  on Stage IIIa CKD - baseline creatinine between 1.3 to 1.4.  - slight worsenning of creatinine from 1.4 to 1.6.  - switched back to oral lasix.    COPD Continue bronchodilators as needed. Diminished air entry through out the lungs. No wheezing heard.  Patient uses trilogy at home transition to BiPAP while at the facility On 3 L of oxygen at night at home.   Morbidly obese Recommend outpatient follow-up with PCP patient may benefit from bariatric evaluation as outpatient. Body mass index is 49.45 kg/m.     Obstructive sleep apnea/obesity hypoventilation syndrome Continue with BIPAP and use trilogy at home.    Mild acute on chronic diastolic heart failure Diuresing , back to 40 mg of lasix daily as his creatinine worsened with iV lasix.  Continue strict intake and output and daily weights.  Therapy evaluation recommending SNF, currently waiting for equipment to be delivered at SNF to be discharged.     hyperlipidemia:  Resume statin.    Anemia of chronic disease:  Hemoglobin is stable around 10.    Mild thrombocytopenia  No bleeding seen at this time, continue to monitor Unclear etiology  DVT prophylaxis: Eliquis Code Status:  Full code  Family Communication: family at bedside.  Disposition:   Status is: Inpatient  Remains inpatient appropriate because:IV treatments appropriate due to intensity of illness or inability to take PO   Dispo:  Patient From: Home  Planned Disposition: [place]  Medically stable for discharge: No         Consultants:   None.    Procedures: CT chest without contrast   Antimicrobials: cefepime    Subjective: No chest pain or sob.   Objective: Vitals:   02/21/21 2154 02/22/21 0100 02/22/21 0614 02/22/21 1000  BP:   116/62 (!) 114/54  Pulse: 68 60 61   Resp: 16  18 18   Temp:   98 F (36.7 C) 97.7 F (36.5 C)  TempSrc:   Oral Oral  SpO2: 100%  99%   Weight:      Height:        Intake/Output Summary (Last 24 hours) at 02/22/2021 1030 Last data filed at 02/21/2021 1623 Gross per 24 hour  Intake --  Output 250 ml  Net -250 ml   Filed Weights   02/14/21 1927 02/15/21 0500 02/21/21 0536  Weight: (!) 171.5 kg (!) 173 kg (!) 174.7 kg    Examination:  General exam:  Morbidly obese gentleman, not in distress.  Respiratory system: diminished air entry atbases, no wheezing heard, on RA.  Cardiovascular system: s1s2 heard, no JVD, RRR, pedal edema 3+ Gastrointestinal system: Abdomen is soft non tender nondistended bowel sounds heard.  Central nervous system: alert and oriented, non focal.  Extremities: bilateral 3+ leg edema. Chronic lymphedema with hyperpigmentation changes over the lower extremities.  Skin: no ulcers seen.  Psychiatry: Mood is appropriate.     Data Reviewed: I have personally reviewed following labs and imaging studies  CBC: Recent Labs  Lab 02/16/21 0149 02/17/21 0312 02/18/21 0239 02/22/21 0221  WBC 7.8 6.5 6.5 6.0  NEUTROABS  --   --   --  4.3  HGB 11.0* 10.5* 10.4* 10.9*  HCT 34.3* 33.3* 33.3* 34.3*  MCV 105.9* 105.4* 106.1* 103.9*  PLT 98* 97* 116* 027    Basic Metabolic Panel: Recent Labs  Lab  02/16/21 0149 02/17/21 0312 02/18/21 0239 02/20/21 0259 02/21/21 0245 02/22/21 0221  NA 138 137 140 140 140 140  K 3.6 3.6 3.6 3.8 3.8 3.8  CL 98 96* 97* 100 101 100  CO2 34* 31 35* 32 31 31  GLUCOSE 109* 101* 96 87 86 91  BUN 35* 33* 36* 35* 36* 35*  CREATININE 1.52* 1.40* 1.44* 1.37* 1.46* 1.61*  CALCIUM 8.6* 8.5* 8.6* 8.6* 8.5* 8.6*  MG 2.1 2.2  --   --   --   --     GFR: Estimated Creatinine Clearance: 63.8 mL/min (A) (by C-G formula based on SCr of 1.61 mg/dL (H)).  Liver Function Tests: No results for input(s): AST, ALT, ALKPHOS, BILITOT, PROT, ALBUMIN in the last 168 hours.  CBG: No results for input(s): GLUCAP in the last 168 hours.   Recent Results (from the past 240 hour(s))  Blood culture (routine single)     Status: None   Collection Time: 02/12/21 12:44 PM   Specimen: Left Antecubital; Blood  Result Value Ref Range Status   Specimen Description   Final  LEFT ANTECUBITAL Performed at [facility], [address] [place], [place] [address]    Special Requests   Final    Blood Culture adequate volume Performed at [facility], [address], [place], [place] [address]    Culture   Final    NO GROWTH 5 DAYS Performed at [facility], [place] [address], [place], [place] [address]    Report Status 02/17/2021 FINAL  Final  Urine culture     Status: Abnormal   Collection Time: 02/12/21 12:44 PM   Specimen: In/Out Cath Urine  Result Value Ref Range Status   Specimen Description   Final    IN/OUT CATH URINE Performed at [facility], [address] [place], [place] [address]    Special Requests   Final    NONE Performed at [facility], [address], [place], [place] [address]    Culture (A)  Final    10,000 COLONIES/mL AEROCOCCUS URINAE Standardized susceptibility testing for this organism is not available. Performed at [facility], [place] [address]  [place], [place] [address]    Report Status 02/16/2021 FINAL  Final  Resp Panel by RT-PCR (Flu A&B, Covid)     Status: None   Collection Time: 02/12/21 12:54 PM   Specimen: Nasopharyngeal(NP) swabs in vial transport medium  Result Value Ref Range Status   SARS Coronavirus 2 by RT PCR NEGATIVE NEGATIVE Final    Comment: (NOTE) SARS-CoV-2 target nucleic acids are NOT DETECTED.  The SARS-CoV-2 RNA is generally detectable in upper respiratory specimens during the acute phase of infection. The lowest concentration of SARS-CoV-2 viral copies this assay can detect is 138 copies/mL. A negative result does not preclude SARS-Cov-2 infection and should not be used as the sole basis for treatment or other patient management decisions. A negative result may occur with  improper specimen collection/handling, submission of specimen other than nasopharyngeal swab, presence of viral mutation(s) within the areas targeted by this assay, and inadequate number of viral copies(<138 copies/mL). A negative result must be combined with clinical observations, patient history, and epidemiological information. The expected result is Negative.  Fact Sheet for Patients:  EntrepreneurPulse.com.au  Fact Sheet for Healthcare Providers:  IncredibleEmployment.be  This test is no t yet approved or cleared by the [place] FDA and  has been authorized for detection and/or diagnosis of SARS-CoV-2 by FDA under an Emergency Use Authorization (EUA). This EUA will remain  in effect (meaning this test can be used) for the duration of the COVID-19 declaration under Section 564(b)(1) of the Act, 21 U.S.C.section 360bbb-3(b)(1), unless the authorization is terminated  or revoked sooner.       Influenza A by PCR NEGATIVE NEGATIVE Final   Influenza B by PCR NEGATIVE NEGATIVE Final    Comment: (NOTE) The Xpert Xpress SARS-CoV-2/FLU/RSV plus assay is intended as an aid in the diagnosis of  influenza from Nasopharyngeal swab specimens and should not be used as a sole basis for treatment. Nasal washings and aspirates are unacceptable for Xpert Xpress SARS-CoV-2/FLU/RSV testing.  Fact Sheet for Patients: EntrepreneurPulse.com.au  Fact Sheet for Healthcare Providers: IncredibleEmployment.be  This test is not yet approved or cleared by the [place] FDA and has been authorized for detection and/or diagnosis of SARS-CoV-2 by FDA under an Emergency Use Authorization (EUA). This EUA will remain in effect (meaning this test can be used) for the duration of the COVID-19 declaration under Section 564(b)(1) of the Act, 21 U.S.C. section 360bbb-3(b)(1), unless  the authorization is terminated or revoked.  Performed at [facility], [address], [place], [place] [address]   MRSA PCR Screening     Status: None   Collection Time: 02/12/21  5:55 PM   Specimen: Nasopharyngeal  Result Value Ref Range Status   MRSA by PCR NEGATIVE NEGATIVE Final    Comment:        The GeneXpert MRSA Assay (FDA approved for NASAL specimens only), is one component of a comprehensive MRSA colonization surveillance program. It is not intended to diagnose MRSA infection nor to guide or monitor treatment for MRSA infections. Performed at [facility], [place] [address] [place], [place] [address]   Resp Panel by RT-PCR (Flu A&B, Covid) Nasopharyngeal Swab     Status: None   Collection Time: 02/14/21  7:40 PM   Specimen: Nasopharyngeal Swab; Nasopharyngeal(NP) swabs in vial transport medium  Result Value Ref Range Status   SARS Coronavirus 2 by RT PCR NEGATIVE NEGATIVE Final    Comment: (NOTE) SARS-CoV-2 target nucleic acids are NOT DETECTED.  The SARS-CoV-2 RNA is generally detectable in upper respiratory specimens during the acute phase of infection. The lowest concentration of SARS-CoV-2 viral copies this assay can  detect is 138 copies/mL. A negative result does not preclude SARS-Cov-2 infection and should not be used as the sole basis for treatment or other patient management decisions. A negative result may occur with  improper specimen collection/handling, submission of specimen other than nasopharyngeal swab, presence of viral mutation(s) within the areas targeted by this assay, and inadequate number of viral copies(<138 copies/mL). A negative result must be combined with clinical observations, patient history, and epidemiological information. The expected result is Negative.  Fact Sheet for Patients:  EntrepreneurPulse.com.au  Fact Sheet for Healthcare Providers:  IncredibleEmployment.be  This test is no t yet approved or cleared by the [place] FDA and  has been authorized for detection and/or diagnosis of SARS-CoV-2 by FDA under an Emergency Use Authorization (EUA). This EUA will remain  in effect (meaning this test can be used) for the duration of the COVID-19 declaration under Section 564(b)(1) of the Act, 21 U.S.C.section 360bbb-3(b)(1), unless the authorization is terminated  or revoked sooner.       Influenza A by PCR NEGATIVE NEGATIVE Final   Influenza B by PCR NEGATIVE NEGATIVE Final    Comment: (NOTE) The Xpert Xpress SARS-CoV-2/FLU/RSV plus assay is intended as an aid in the diagnosis of influenza from Nasopharyngeal swab specimens and should not be used as a sole basis for treatment. Nasal washings and aspirates are unacceptable for Xpert Xpress SARS-CoV-2/FLU/RSV testing.  Fact Sheet for Patients: EntrepreneurPulse.com.au  Fact Sheet for Healthcare Providers: IncredibleEmployment.be  This test is not yet approved or cleared by the [place] FDA and has been authorized for detection and/or diagnosis of SARS-CoV-2 by FDA under an Emergency Use Authorization (EUA). This EUA will remain in  effect (meaning this test can be used) for the duration of the COVID-19 declaration under Section 564(b)(1) of the Act, 21 U.S.C. section 360bbb-3(b)(1), unless the authorization is terminated or revoked.  Performed at [facility], [place] [address] [place], [place] [address]   Blood culture (routine x 2)     Status: None   Collection Time: 02/14/21  7:40 PM   Specimen: BLOOD LEFT WRIST  Result Value Ref Range Status   Specimen Description BLOOD LEFT WRIST  Final   Special Requests   Final    BOTTLES DRAWN AEROBIC AND  ANAEROBIC Blood Culture adequate volume   Culture   Final    NO GROWTH 5 DAYS Performed at [facility], [place] [address], [place], [place] [address]    Report Status 02/19/2021 FINAL  Final  Blood culture (routine x 2)     Status: None   Collection Time: 02/15/21  1:20 AM   Specimen: BLOOD  Result Value Ref Range Status   Specimen Description BLOOD SITE NOT SPECIFIED  Final   Special Requests   Final    BOTTLES DRAWN AEROBIC AND ANAEROBIC Blood Culture adequate volume   Culture   Final    NO GROWTH 5 DAYS Performed at [facility], [address], [place], [place] [address]    Report Status 02/20/2021 FINAL  Final  MRSA PCR Screening     Status: None   Collection Time: 02/15/21  8:36 AM   Specimen: Nasopharyngeal  Result Value Ref Range Status   MRSA by PCR NEGATIVE NEGATIVE Final    Comment:        The GeneXpert MRSA Assay (FDA approved for NASAL specimens only), is one component of a comprehensive MRSA colonization surveillance program. It is not intended to diagnose MRSA infection nor to guide or monitor treatment for MRSA infections. Performed at [facility], [place] [address], [place], [place] [address]   Body fluid culture w Gram Stain     Status: None   Collection Time: 02/18/21 10:34 AM   Specimen: Lung, Right; Pleural Fluid  Result Value Ref Range Status   Specimen Description PLEURAL FLUID  Final   Special Requests LUNG  RIGHT  Final   Gram Stain   Final    ABUNDANT WBC PRESENT,BOTH PMN AND MONONUCLEAR NO ORGANISMS SEEN    Culture   Final    NO GROWTH 3 DAYS Performed at [facility], [place] [address] [place], [place] [address]    Report Status 02/21/2021 FINAL  Final         Radiology Studies: DG CHEST PORT 1 VIEW  Result Date: 02/21/2021 CLINICAL DATA:  Short of breath. Paracentesis today. Concern for pneumothorax EXAM: PORTABLE CHEST 1 VIEW COMPARISON:  02/18/2021 FINDINGS: Stable enlarged cardiac silhouette. There is moderate RIGHT effusion unchanged. No pneumothorax. Pacer noted. IMPRESSION: No pneumothorax. Cardiomegaly, low lung volumes, and pleural effusion. Electronically Signed   By: Suzy Bouchard M.D.   On: 02/21/2021 09:12        Scheduled Meds: . allopurinol  100 mg Oral Daily  . apixaban  5 mg Oral BID  . vitamin C  500 mg Oral Daily  . atorvastatin  80 mg Oral Daily  . famotidine  40 mg Oral Daily  . fluticasone furoate-vilanterol  1 puff Inhalation Daily   And  . umeclidinium bromide  1 puff Inhalation Daily  . furosemide  40 mg Oral Daily  . gabapentin  300 mg Oral BID  . polyethylene glycol  17 g Oral Daily   Continuous Infusions:    LOS: 7 days        Hosie Poisson, MD [facility]   To contact the attending provider between 7A-7P or the covering provider during after hours 7P-7A, please log into the web site [email or web address] and access using universal [place] password for that web site. If you do not have the password, please call the hospital operator.  02/22/2021, 10:30 AM   Addendum:  RN reports pt woke up from sleep, has twitching of the upper extremities. Unable  to hold the phone to order breakfast. Pt reports feeling drowsy and sleepy, like he is drunk.  On exam he is alert and no speech abnormalities, no dysarthria. He is oriented to place person .  No focal deficits on exam.  He has some twitching on upper extremities, improved later  on.  Vital signs wnl, except for transient tachycardia.  Pt denies any sob or chest pain.  A stat CT head without contrast ordered for further eval.  EEG ordered to check for seizures. Stat labs of bMP, magnesium and phosphorus ordered Will continue to monitor.   Hosie Poisson, MD

## 2021-02-22 NOTE — Progress Notes (Signed)
Attempted EEG. Pt will be going for  STAT Ct soon. Will attempt on Monday when our schedule permits

## 2021-02-23 ENCOUNTER — Inpatient Hospital Stay (HOSPITAL_COMMUNITY): Payer: Medicare HMO

## 2021-02-23 DIAGNOSIS — E1122 Type 2 diabetes mellitus with diabetic chronic kidney disease: Secondary | ICD-10-CM | POA: Diagnosis not present

## 2021-02-23 DIAGNOSIS — R4182 Altered mental status, unspecified: Secondary | ICD-10-CM | POA: Diagnosis not present

## 2021-02-23 DIAGNOSIS — G253 Myoclonus: Secondary | ICD-10-CM | POA: Diagnosis not present

## 2021-02-23 DIAGNOSIS — J449 Chronic obstructive pulmonary disease, unspecified: Secondary | ICD-10-CM | POA: Diagnosis not present

## 2021-02-23 DIAGNOSIS — J9621 Acute and chronic respiratory failure with hypoxia: Secondary | ICD-10-CM | POA: Diagnosis not present

## 2021-02-23 DIAGNOSIS — J9601 Acute respiratory failure with hypoxia: Secondary | ICD-10-CM | POA: Diagnosis not present

## 2021-02-23 DIAGNOSIS — N183 Chronic kidney disease, stage 3 unspecified: Secondary | ICD-10-CM | POA: Diagnosis not present

## 2021-02-23 LAB — BASIC METABOLIC PANEL
Anion gap: 7 (ref 5–15)
BUN: 34 mg/dL — ABNORMAL HIGH (ref 8–23)
CO2: 34 mmol/L — ABNORMAL HIGH (ref 22–32)
Calcium: 9 mg/dL (ref 8.9–10.3)
Chloride: 99 mmol/L (ref 98–111)
Creatinine, Ser: 1.68 mg/dL — ABNORMAL HIGH (ref 0.61–1.24)
GFR, Estimated: 41 mL/min — ABNORMAL LOW (ref >60)
Glucose, Bld: 95 mg/dL (ref 70–99)
Potassium: 3.9 mmol/L (ref 3.5–5.1)
Sodium: 140 mmol/L (ref 135–145)

## 2021-02-23 MED ORDER — LORAZEPAM 2 MG/ML IJ SOLN: 1.0000 mg | Freq: Once | INTRAMUSCULAR | Status: AC

## 2021-02-23 MED ORDER — LORAZEPAM 2 MG/ML IJ SOLN
INTRAMUSCULAR | Status: AC
  Administered 2021-02-23: 2 mg via INTRAVENOUS
  Filled 2021-02-23: qty 1

## 2021-02-23 NOTE — Progress Notes (Signed)
Pt placed on home trilogy machine.  3L of 02 bleed in per home settings.  Pt tolerating well.

## 2021-02-23 NOTE — Progress Notes (Signed)
Physical Therapy Treatment Patient Details Name: [name] MRN: [id number] DOB: 1940/12/11 Today's Date: 02/23/2021    History of Present Illness Pt is a 80 y.o. male recently admitted to [facility] (02/12/21-02/14/21) with acute on chronic respiratory failure, now readmitted 02/14/21 due to weakness and SOB, unable to stand up from toilet requiring EMS call. Found to be hypoxic. Workup for acute on chronic hypoxic respiratory failure, possibly combination of acute on chronic dHF and PNA. PMH includes OSA, HTN, obesity, afib, CHF, venous stasis dermatitis.    PT Comments    Patient received in bed, just finishing EEG. Patient agreeable to PT session. He requires +2 max assist for bed mobility. He is able to use bed rails to assist but due to size he requires +2. Posterior leaning with sitting on edge of bed. He is able to correct when cued, but will tend to lean back again due to weakness. Required mod to supervision with sitting balance. Patient transfer deferred this session. He performed bed level exercises with B LE. Patient will continue to benefit from skilled PT while here to improve strength and functional independence.     Follow Up Recommendations  SNF;Supervision for mobility/OOB     Equipment Recommendations  Other (comment) (TBD)    Recommendations for Other Services       Precautions / Restrictions Precautions Precautions: Fall Precaution Comments: watch O2 Required Braces or Orthoses: Other Brace Other Brace: Has bilateral knee braces/compression wrap (for stability) Restrictions Weight Bearing Restrictions: No    Mobility  Bed Mobility Overal bed mobility: Needs Assistance Bed Mobility: Supine to Sit;Sit to Supine;Rolling Rolling: Max assist;+2 for physical assistance   Supine to sit: Max assist;+2 for physical assistance Sit to supine: Max assist;+2 for physical assistance   General bed mobility comments: Patient does assist some, but still needs +2 due to  size. Posterior leaning with sitting edge of bed.    Transfers                 General transfer comment: Not attempted this date. Patient having jerking/jumping type movements in legs and just had EEG performed.  Ambulation/Gait             General Gait Details: unable this session   Stairs             Wheelchair Mobility    Modified Rankin (Stroke Patients Only)       Balance Overall balance assessment: Needs assistance Sitting-balance support: Feet supported;Single extremity supported Sitting balance-Leahy Scale: Fair Sitting balance - Comments: Requires assist with initial sitting. Posterior leaning. Able to correct when cued. Postural control: Posterior lean                                  Cognition Arousal/Alertness: Awake/alert Behavior During Therapy: WFL for tasks assessed/performed Overall Cognitive Status: Within Functional Limits for tasks assessed                                 General Comments: Verbose with conversation requiring redirection to task      Exercises Total Joint Exercises Ankle Circles/Pumps: PROM;Both;10 reps Heel Slides: AAROM;Both;10 reps Hip ABduction/ADduction: AROM;Both;10 reps Straight Leg Raises: AROM;Both;10 reps    General Comments        Pertinent Vitals/Pain Pain Assessment: No/denies pain    Home Living  Prior Function            PT Goals (current goals can now be found in the care plan section) Acute Rehab PT Goals Patient Stated Goal: to get to rehab PT Goal Formulation: With patient Time For Goal Achievement: 03/01/21 Potential to Achieve Goals: Good Progress towards PT goals: Progressing toward goals    Frequency    Min 2X/week      PT Plan Current plan remains appropriate    Co-evaluation              AM-PAC PT "6 Clicks" Mobility   Outcome Measure  Help needed turning from your back to your side while in a  flat bed without using bedrails?: Total Help needed moving from lying on your back to sitting on the side of a flat bed without using bedrails?: Total Help needed moving to and from a bed to a chair (including a wheelchair)?: Total Help needed standing up from a chair using your arms (e.g., wheelchair or bedside chair)?: Total Help needed to walk in hospital room?: Total Help needed climbing 3-5 steps with a railing? : Total 6 Click Score: 6    End of Session Equipment Utilized During Treatment: Oxygen Activity Tolerance: Patient limited by fatigue Patient left: in bed;with call bell/phone within reach;with bed alarm set Nurse Communication: Mobility status PT Visit Diagnosis: Other abnormalities of gait and mobility (R26.89)     Time: 7902-4097 PT Time Calculation (min) (ACUTE ONLY): 31 min  Charges:  $Therapeutic Exercise: 8-22 mins $Therapeutic Activity: 8-22 mins                     Berdia Lachman, PT, GCS 02/23/21,1:30 PM

## 2021-02-23 NOTE — Procedures (Signed)
Patient Name: Yacob Wilkerson  MRN: [id number]  Epilepsy Attending: Lora Havens  Referring Physician/Provider: Dr Hosie Poisson Date: 02/23/2021 Duration: 23.18 mins  Patient history: 80 year old male with meningioma, altered mental status.  EEG done for seizures.  Level of alertness: Awake  AEDs during EEG study: Gabapentin  Technical aspects: This EEG study was done with scalp electrodes positioned according to the 10-20 International system of electrode placement. Electrical activity was acquired at a sampling rate of 500Hz  and reviewed with a high frequency filter of 70Hz  and a low frequency filter of 1Hz . EEG data were recorded continuously and digitally stored.   Description: The posterior dominant rhythm consists of 8 Hz activity of moderate voltage (25-35 uV) seen predominantly in posterior head regions, symmetric and reactive to eye opening and eye closing. EEG showed continuous generalized 5 to 7 Hz theta as well as intermittent generalized 2 to 3 Hz delta slowing. Hyperventilation and photic stimulation were not performed.     ABNORMALITY - Continuous slow, generalized  IMPRESSION: This study is suggestive of mild diffuse encephalopathy, nonspecific etiology. No seizures or epileptiform discharges were seen throughout the recording.   Nikiesha Milford Barbra Sarks

## 2021-02-23 NOTE — Progress Notes (Signed)
EEG completed, results pending. 

## 2021-02-23 NOTE — Progress Notes (Signed)
PROGRESS NOTE    [name]  OHY:[id number] DOB: [birth year] DOA: 02/14/2021 PCP: [name], MD    Chief Complaint  Patient presents with  . Weakness  . Shortness of Breath    Brief Narrative:   [name] is a 80 year old male with past medical history significant for paroxysmal atrial fibrillation, aortic stenosis s/p TAVR, chronic diastolic congestive heart failure, degenerative disc disease, gout, hyperlipidemia, hypertension, morbid obesity, OSA/hypoventilation syndrome, OA, CKD3a, peripheral neuropathy, CAD, history of prostate cancer, venous stasis dermatitis who presented to [facility] ED on 5/28 via EMS for weakness and dyspnea.  Patient reports was unable to get off the toilet with associated shortness of breath and generalized weakness.  Patient recently hospitalized at [facility] long hospital from 5/26 through 5/28 for decompensated OSA/OHS.  During that hospitalization, his BiPAP settings were optimized with a new facemask.  Patient also reports during the hospitalization he had very little mobility which he believes caused deconditioning when he returned home. Earlier on admission, he was on 3 lit of [place] oxygen during the day and we were able to wean his oxygen to 1 lit/min. He also underwent IR thoracentesis and 700 ml of sero sanguinous fluid taken out. His fluid analysis has been negative.  Meanwhile yesterday evening pt had an episode of lethargy, thick speech and tremors of the upper extremities after waking up from his nap. Initial CT head was negative for stroke, showed meningioma with mild brain edema.  It was followed up with an MRI Brain, but unable to perform due to hypoxia and body habitus. Neurology consulted and EEG ordered.   Assessment & Plan:   Principal Problem:   Acute respiratory failure with hypoxia (HCC) Active Problems:   HYPERCHOLESTEROLEMIA   Obesity, Class III, BMI 40-49.9 (morbid obesity) (HCC)   Macrocytic anemia   Obstructive  sleep apnea   COPD mixed type (HCC)   Essential hypertension   CKD stage 3 secondary to diabetes (HCC)   Obesity hypoventilation syndrome (HCC)   Acute on chronic respiratory failure with hypoxemia (HCC)   Acute on chronic hypoxic respiratory failure present on admission possibly combination of acute on chronic diastolic heart failure and pneumonia. Chest x-ray showing right basilar airspace opacity on admission, mildly elevated WBC count. He was started on vancomycin and cefepime , completed 7 days of IV cefepime.  CT chest without contrast ordered,  Moderate right, small left pleural effusions and associated atelectasis or consolidation. Mild interlobular septal thickening throughout the lungs, consistent with mild pulmonary edema.  IR consulted underwent thoracentesis, about 700 ml of sero sanguinous fluid taken out and sent for analysis. So far cultures are negative. Sputum cultures negative so far. Cytology is negative for malignant cells. Repeat CXR shows persistent moderate right sided pleural effusion.      Acute  on Stage IIIa CKD - baseline creatinine between 1.3 to 1.4. - slight worsening of creatinine when we tried to diurese with IV lasix.    COPD Diminished air entry throughout the lungs, no wheezing heard.  Bronchodilators as needed.  Patient uses trilogy at home transition to BiPAP while at the facility On 3 L of oxygen at night at home. Currently on 1 lit of [place] oxygen during the day.    Morbidly obese Recommend outpatient follow-up with PCP patient may benefit from bariatric evaluation as outpatient. Body mass index is 49.45 kg/m.     Obstructive sleep apnea/obesity hypoventilation syndrome Continue with BIPAP and use trilogy at home.  Mild acute on chronic diastolic heart failure Diuresing well, back to 40 mg of lasix daily  Continue strict intake and output and daily weights.  Therapy evaluation recommending SNF, currently waiting for equipment (  bipap) to be delivered at SNF to be discharged.    Hyperlipidemia:  Continue with statin.    Anemia of chronic disease:  Hemoglobin is stable around 10.    Mild thrombocytopenia  Resolved.    Tremors in the upper extremities started on 02/22/21:  Unclear etiology.  CT head showed Partially calcified meningioma the posterior frontal falx, more to the left side than the right, measuring 2.6 x 2.3 x 2.4 cm, a couple of mm larger than was seen last year. Mass-effect upon the left medial frontal lobe with mild brain edema. No acute stroke. It was followed up with an MRI brain, but unable to undergo as pt couldn't lay flat and due to body habitus. Meanwhile Neurology consulted for recommendations. EEG suggestive of mild diffuse encephalopathy, nonspecific etiology. No seizures or epileptiform discharges were seen throughout the recording.     DVT prophylaxis: Eliquis Code Status: Full code  Family Communication: family at bedside.  Disposition:   Status is: Inpatient  Remains inpatient appropriate because:IV treatments appropriate due to intensity of illness or inability to take PO   Dispo:  Patient From: Home  Planned Disposition: [name]  Medically stable for discharge: No         Consultants:   None.    Procedures: CT chest without contrast   Antimicrobials: cefepime    Subjective: No nausea or abd pain. , pt teary today after talking about MRI and meningioma.   Objective: Vitals:   02/22/21 2148 02/22/21 2252 02/23/21 0544 02/23/21 0813  BP:   (!) 107/59   Pulse:  60 60   Resp:  17 16   Temp:   97.9 F (36.6 C)   TempSrc:   Oral   SpO2: 97% 94% 99% 94%  Weight:      Height:        Intake/Output Summary (Last 24 hours) at 02/23/2021 1559 Last data filed at 02/23/2021 0544 Gross per 24 hour  Intake --  Output 400 ml  Net -400 ml   Filed Weights   02/14/21 1927 02/15/21 0500 02/21/21 0536  Weight: (!) 171.5 kg (!) 173 kg (!) 174.7  kg    Examination:  General exam: Morbidly obese gentleman, not in distress on 1 lit/min [place] oxygen.  Respiratory system: diminished air entry throughout the lungs. No wheezing heard.  Cardiovascular system: S1S2 Heard, RRR, JVD cannot be appreciated.  Gastrointestinal system: Abdomen is soft NT ND BS+ Central nervous system: alert and oriented, non focal.  Extremities: bilateral 3+ leg edema. Chronic lymphedema with hyperpigmentation changes over the lower extremities.  Skin: no rashes seen.  Psychiatry: teary today.     Data Reviewed: I have personally reviewed following labs and imaging studies  CBC: Recent Labs  Lab 02/17/21 0312 02/18/21 0239 02/22/21 0221  WBC 6.5 6.5 6.0  NEUTROABS  --   --  4.3  HGB 10.5* 10.4* 10.9*  HCT 33.3* 33.3* 34.3*  MCV 105.4* 106.1* 103.9*  PLT 97* 116* 109    Basic Metabolic Panel: Recent Labs  Lab 02/17/21 0312 02/18/21 0239 02/20/21 0259 02/21/21 0245 02/22/21 0221 02/22/21 1639 02/23/21 0752  NA 137   < > 140 140 140 141 140  K 3.6   < > 3.8 3.8 3.8 3.9 3.9  CL 96*   < >  100 101 100 99 99  CO2 31   < > 32 31 31 34* 34*  GLUCOSE 101*   < > 87 86 91 115* 95  BUN 33*   < > 35* 36* 35* 34* 34*  CREATININE 1.40*   < > 1.37* 1.46* 1.61* 1.59* 1.68*  CALCIUM 8.5*   < > 8.6* 8.5* 8.6* 9.0 9.0  MG 2.2  --   --   --   --  2.1  --   PHOS  --   --   --   --   --  4.0  --    < > = values in this interval not displayed.    GFR: Estimated Creatinine Clearance: 61.1 mL/min (A) (by C-G formula based on SCr of 1.68 mg/dL (H)).  Liver Function Tests: No results for input(s): AST, ALT, ALKPHOS, BILITOT, PROT, ALBUMIN in the last 168 hours.  CBG: Recent Labs  Lab 02/22/21 1611  GLUCAP 116*     Recent Results (from the past 240 hour(s))  Resp Panel by RT-PCR (Flu A&B, Covid) Nasopharyngeal Swab     Status: None   Collection Time: 02/14/21  7:40 PM   Specimen: Nasopharyngeal Swab; Nasopharyngeal(NP) swabs in vial transport medium   Result Value Ref Range Status   SARS Coronavirus 2 by RT PCR NEGATIVE NEGATIVE Final    Comment: (NOTE) SARS-CoV-2 target nucleic acids are NOT DETECTED.  The SARS-CoV-2 RNA is generally detectable in upper respiratory specimens during the acute phase of infection. The lowest concentration of SARS-CoV-2 viral copies this assay can detect is 138 copies/mL. A negative result does not preclude SARS-Cov-2 infection and should not be used as the sole basis for treatment or other patient management decisions. A negative result may occur with  improper specimen collection/handling, submission of specimen other than nasopharyngeal swab, presence of viral mutation(s) within the areas targeted by this assay, and inadequate number of viral copies(<138 copies/mL). A negative result must be combined with clinical observations, patient history, and epidemiological information. The expected result is Negative.  Fact Sheet for Patients:  EntrepreneurPulse.com.au  Fact Sheet for Healthcare Providers:  IncredibleEmployment.be  This test is no t yet approved or cleared by the [place] FDA and  has been authorized for detection and/or diagnosis of SARS-CoV-2 by FDA under an Emergency Use Authorization (EUA). This EUA will remain  in effect (meaning this test can be used) for the duration of the COVID-19 declaration under Section 564(b)(1) of the Act, 21 U.S.C.section 360bbb-3(b)(1), unless the authorization is terminated  or revoked sooner.       Influenza A by PCR NEGATIVE NEGATIVE Final   Influenza B by PCR NEGATIVE NEGATIVE Final    Comment: (NOTE) The Xpert Xpress SARS-CoV-2/FLU/RSV plus assay is intended as an aid in the diagnosis of influenza from Nasopharyngeal swab specimens and should not be used as a sole basis for treatment. Nasal washings and aspirates are unacceptable for Xpert Xpress SARS-CoV-2/FLU/RSV testing.  Fact Sheet for  Patients: EntrepreneurPulse.com.au  Fact Sheet for Healthcare Providers: IncredibleEmployment.be  This test is not yet approved or cleared by the [place] FDA and has been authorized for detection and/or diagnosis of SARS-CoV-2 by FDA under an Emergency Use Authorization (EUA). This EUA will remain in effect (meaning this test can be used) for the duration of the COVID-19 declaration under Section 564(b)(1) of the Act, 21 U.S.C. section 360bbb-3(b)(1), unless the authorization is terminated or revoked.  Performed at [facility], [place] [address],  [place], [place] [address]   Blood culture (routine x 2)     Status: None   Collection Time: 02/14/21  7:40 PM   Specimen: BLOOD LEFT WRIST  Result Value Ref Range Status   Specimen Description BLOOD LEFT WRIST  Final   Special Requests   Final    BOTTLES DRAWN AEROBIC AND ANAEROBIC Blood Culture adequate volume   Culture   Final    NO GROWTH 5 DAYS Performed at [facility], [address] [place], [place] [address]    Report Status 02/19/2021 FINAL  Final  Blood culture (routine x 2)     Status: None   Collection Time: 02/15/21  1:20 AM   Specimen: BLOOD  Result Value Ref Range Status   Specimen Description BLOOD SITE NOT SPECIFIED  Final   Special Requests   Final    BOTTLES DRAWN AEROBIC AND ANAEROBIC Blood Culture adequate volume   Culture   Final    NO GROWTH 5 DAYS Performed at [facility], [address] [place], [place] [address]    Report Status 02/20/2021 FINAL  Final  MRSA PCR Screening     Status: None   Collection Time: 02/15/21  8:36 AM   Specimen: Nasopharyngeal  Result Value Ref Range Status   MRSA by PCR NEGATIVE NEGATIVE Final    Comment:        The GeneXpert MRSA Assay (FDA approved for NASAL specimens only), is one component of a comprehensive MRSA colonization surveillance program. It is not intended to diagnose MRSA infection nor to guide  or monitor treatment for MRSA infections. Performed at [facility], [place] [address] [place], [place] [address]   Body fluid culture w Gram Stain     Status: None   Collection Time: 02/18/21 10:34 AM   Specimen: Lung, Right; Pleural Fluid  Result Value Ref Range Status   Specimen Description PLEURAL FLUID  Final   Special Requests LUNG RIGHT  Final   Gram Stain   Final    ABUNDANT WBC PRESENT,BOTH PMN AND MONONUCLEAR NO ORGANISMS SEEN    Culture   Final    NO GROWTH 3 DAYS Performed at [facility], [place] [address] [place], [place] [address]    Report Status 02/21/2021 FINAL  Final         Radiology Studies: CT HEAD WO CONTRAST  Result Date: 02/22/2021 CLINICAL DATA:  Mental status changes of unknown cause. EXAM: CT HEAD WITHOUT CONTRAST TECHNIQUE: Contiguous axial images were obtained from the base of the skull through the vertex without intravenous contrast. COMPARISON:  04/15/2020 FINDINGS: Brain: The brainstem and cerebellum are normal. Again demonstrated is a partially calcified meningioma primarily along the left side of the falx in the posterior frontal region measuring 2.6 x 2.3 x 2.4 cm. Mild extension to the right side of the falx. Some mass-effect upon the medial left frontal lobe, with mild adjacent edema. No sign of acute infarction, intra-axial mass lesion, hemorrhage, hydrocephalus or extra-axial collection. Vascular: There is atherosclerotic calcification of the major vessels at the base of the brain. Skull: Negative Sinuses/Orbits: Clear/normal Other: None IMPRESSION: No sign of acute infarction or intracranial hemorrhage. Partially calcified meningioma the posterior frontal falx, more to the left side than the right, measuring 2.6 x 2.3 x 2.4 cm, a couple of mm larger than was seen last year. Mass-effect upon the left medial frontal lobe with mild brain edema. Though this abnormality is notable, I would not expect  it to be associated with an acute mental  status change. Electronically Signed   By: Nelson Chimes M.D.   On: 02/22/2021 18:30   EEG adult  Result Date: 02/23/2021 Lora Havens, MD     02/23/2021  1:49 PM Patient Name: [name] MRN: [id number] Epilepsy Attending: Lora Havens Referring Physician/Provider: Dr Hosie Poisson Date: 02/23/2021 Duration: 23.18 mins Patient history: 80 year old male with meningioma, altered mental status.  EEG done for seizures. Level of alertness: Awake AEDs during EEG study: Gabapentin Technical aspects: This EEG study was done with scalp electrodes positioned according to the 10-20 International system of electrode placement. Electrical activity was acquired at a sampling rate of 500Hz  and reviewed with a high frequency filter of 70Hz  and a low frequency filter of 1Hz . EEG data were recorded continuously and digitally stored. Description: The posterior dominant rhythm consists of 8 Hz activity of moderate voltage (25-35 uV) seen predominantly in posterior head regions, symmetric and reactive to eye opening and eye closing. EEG showed continuous generalized 5 to 7 Hz theta as well as intermittent generalized 2 to 3 Hz delta slowing. Hyperventilation and photic stimulation were not performed.   ABNORMALITY - Continuous slow, generalized IMPRESSION: This study is suggestive of mild diffuse encephalopathy, nonspecific etiology. No seizures or epileptiform discharges were seen throughout the recording. Priyanka Barbra Sarks        Scheduled Meds: . allopurinol  100 mg Oral Daily  . apixaban  5 mg Oral BID  . vitamin C  500 mg Oral Daily  . atorvastatin  80 mg Oral Daily  . famotidine  40 mg Oral Daily  . fluticasone furoate-vilanterol  1 puff Inhalation Daily   And  . umeclidinium bromide  1 puff Inhalation Daily  . furosemide  40 mg Oral Daily  . gabapentin  300 mg Oral BID  . polyethylene glycol  17 g Oral Daily   Continuous Infusions:    LOS: 8 days        Hosie Poisson, MD [facility]   To contact the attending provider between 7A-7P or the covering provider during after hours 7P-7A, please log into the web site [email or web address] and access using universal [place] password for that web site. If you do not have the password, please call the hospital operator.  02/23/2021, 3:59 PM

## 2021-02-23 NOTE — Progress Notes (Signed)
Patient returned to floor with low O2 saturation at 85% and very sleepy placed on home Trilogy BiPap machine at 5 Liters. Saturation maintaining between 88 and 92% O2 saturation with this setting.

## 2021-02-23 NOTE — Progress Notes (Signed)
Patient here in MRI for brain w wo. Patient has expressed he was claustrophobic and has had an issue in the past with MRI scans. Patient mentioned that his doctor would prescribe medication for scan because patient states" I cannot lay flat and get into the scanner unless you give me some medication". SWOT RN called floor and got order for medication. Ativan was given. Within a few minutes of giving ativan IV, sats dropped from 92-94% on 2 liters to low 70s with good waveform. Patient was arousable after repeated stimulation. Oxygen bumped to 6 liters and sats slowly rising, patient placed in high fowlers position to improve oxygenation. Scan aborted due to safety concerns. Tim-MRI tech messaged ordering physician. Patient returned to floor with SWOT RN

## 2021-02-23 NOTE — Care Management Important Message (Signed)
Important Message  Patient Details  Name: [name] MRN: [id number] Date of Birth: [birth year]   Medicare Important Message Given:  Yes     Shelda Altes 02/23/2021, 10:59 AM

## 2021-02-23 NOTE — Consult Note (Signed)
Neurology Consultation  Reason for Consult: tremors, ? Seizure like activity Referring Physician: Dr. Venia Minks.   CC: my hands keep jumping.   History is obtained from: patient  HPI: [name] is a 80 y.o. male  With a PMHx of morbid obesity, 1st degree AVB, AS s/p TAVR, back pain, CdHF, COPD, DDD, HLD, OSA with obesity hypoventilation syndrome, CKD III, DM II with peripheral neuropathy, PAF and prostate cancer. Patient was admitted 9 days ago with acute respiratory failure with hypoxia and AKI.   In speaking with patient today, these "twitches" started over a month ago. He had them at home. States they seem to have gotten worse during this stay. States the uncontrollable movements occur at rest and when lifting arms/activity. No LE symptoms. States these movements do not last longer than a few seconds to one minute and resolve on their own. Nothing makes them better or worse. Nothing seems to trigger them, they are just "random". No other symptoms like LOC, incontinence, whole body shaking, or post ictal symptoms. Wife states his speech sounds a little different like he is trying to find a word occasionally. Wife states when he had these movements at home, his speech would sometimes be slurred. Never sought treatment for these movements. Never had seizures before. Denies any brain surgery and he does not remember anyone ever mentioning a meningioma until today.   Neurology was consulted for twitching of his upper extremities query seizure like activity.   ROS: A robust ROS was performed and is negative except as noted in the HPI.   Past Medical History:  Diagnosis Date  . 1st degree AV block 11/09/2018   Noted on EKG   . Anemia   . Aortic stenosis, severe    S/p Edwards Sapien 3 Transcatheter Heart Valve (size 26 mm, model # [id number], serial # [id number])  . Arthritis   . Back pain   . Bursitis   . Cataract    left immature  . Cellulitis 10/12/2015  . CHF (congestive heart  failure) ([place])   . Complication of anesthesia    Halucinations  . Constipation    takes Miralax daily as well as Senokot daily  . DDD (degenerative disc disease)   . Gout    takes Allopurinol daily  . Heart valve disorder   . History of blood clots 1962   knee  . History of blood transfusion    no abnormal reaction noted  . History of shingles   . Hyperlipidemia    takes Atorvastatin daily  . Hypertension    takes Lisinopril daily  . Low back pain 01/25/2017  . Morbid obesity ([place])   . OSA (obstructive sleep apnea)   . Osteoarthritis   . Peripheral neuropathy    takes Gabapentin daily  . Peroneal palsy    significant right foot drop  . Persistent atrial fibrillation ([place])   . Pneumonia 25+yrs ago   hx of  . Prostate cancer ([place]) 02/13/2019  . RBBB 11/09/2018   Noted on EKG  . Thrombocytopenia ([place])   . Urinary frequency   . Urinary urgency   . Valvular heart disease   . Venous stasis dermatitis      Family History  Problem Relation Age of Onset  . Other Father        POSSIBLE HEART ATTACK  . Arthritis Sister        "crippling"  . Prostate cancer Brother   . Arthritis Sister   . Breast cancer Neg  Hx   . Colon cancer Neg Hx      Social History:   reports that he quit smoking about 14 years ago. His smoking use included pipe. He quit after 30.00 years of use. He has never used smokeless tobacco. He reports that he does not drink alcohol and does not use drugs.  Medications  Current Facility-Administered Medications:  .  acetaminophen (TYLENOL) tablet 1,000 mg, 1,000 mg, Oral, Q6H PRN **OR** acetaminophen (TYLENOL) suppository 650 mg, 650 mg, Rectal, Q6H PRN, Reubin Milan, MD .  albuterol (PROVENTIL) (2.5 MG/3ML) 0.083% nebulizer solution 2.5 mg, 2.5 mg, Nebulization, Q2H PRN, Reubin Milan, MD, 2.5 mg at 02/16/21 1552 .  allopurinol (ZYLOPRIM) tablet 100 mg, 100 mg, Oral, Daily, Reubin Milan, MD, 100 mg at 02/23/21 3419 .  apixaban (ELIQUIS)  tablet 5 mg, 5 mg, Oral, BID, Reubin Milan, MD, 5 mg at 02/23/21 6222 .  ascorbic acid (VITAMIN C) tablet 500 mg, 500 mg, Oral, Daily, Reubin Milan, MD, 500 mg at 02/23/21 9798 .  atorvastatin (LIPITOR) tablet 80 mg, 80 mg, Oral, Daily, Reubin Milan, MD, 80 mg at 02/23/21 9211 .  famotidine (PEPCID) tablet 40 mg, 40 mg, Oral, Daily, Reubin Milan, MD, 40 mg at 02/23/21 0820 .  fluticasone furoate-vilanterol (BREO ELLIPTA) 100-25 MCG/INH 1 puff, 1 puff, Inhalation, Daily, 1 puff at 02/23/21 0813 **AND** umeclidinium bromide (INCRUSE ELLIPTA) 62.5 MCG/INH 1 puff, 1 puff, Inhalation, Daily, Reubin Milan, MD, 1 puff at 02/23/21 0813 .  furosemide (LASIX) tablet 40 mg, 40 mg, Oral, Daily, Hosie Poisson, MD, 40 mg at 02/23/21 0821 .  gabapentin (NEURONTIN) capsule 300 mg, 300 mg, Oral, BID, Reubin Milan, MD, 300 mg at 02/23/21 0820 .  lidocaine (PF) (XYLOCAINE) 1 % injection, , , PRN, Bruning, Kevin, PA-C, 30 mL at 02/18/21 1017 .  polyethylene glycol (MIRALAX / GLYCOLAX) packet 17 g, 17 g, Oral, Daily, Reubin Milan, MD, 17 g at 02/21/21 9417   Exam: Current vital signs: BP (!) 107/59 (BP Location: Left Arm)   Pulse 60   Temp 97.9 F (36.6 C) (Oral)   Resp 16   Ht 6\' 2"  (1.88 m)   Wt (!) 174.7 kg   SpO2 94%   BMI 49.45 kg/m  Vital signs in last 24 hours: Temp:  [97.9 F (36.6 C)] 97.9 F (36.6 C) (06/06 0544) Pulse Rate:  [60-71] 60 (06/06 0544) Resp:  [16-18] 16 (06/06 0544) BP: (107-115)/(56-59) 107/59 (06/06 0544) SpO2:  [93 %-99 %] 94 % (06/06 0813)  PE: GENERAL: Fairly well appearing, morbidly obese male in NAD.  Alert and oriented x 3.  HEENT: - Normocephalic and atraumatic LUNGS - Normal respiratory effort. O2 on.  CV - RRR on tele ABDOMEN - Soft, nontender Ext: warm, brownish discoloration to LEs below knee. Flaky skin.  Psych: affect calm, cooperative  NEURO:  Mental Status: AA&Ox3  Speech/Language: speech is without  dysarthria or aphasia. Naming, repetition, fluency, and comprehension intact. He occasionally has a word hesitation that is associated with a right mouth fine tremor, which quickly resolves. This does not cause true dysarthria or aphasia.   Cranial Nerves:  II: PERRL. Visual fields full.  III, IV, VI: EOMI. Lid elevation symmetric and full.  V: sensation is intact and symmetrical to face.  VII: Smile is symmetrical. Able to puff cheeks and raise eyebrows.  VIII:hearing intact to voice IX, X: palate elevation is symmetric. Phonation normal.  XI: normal sternocleidomastoid and trapezius  muscle strength IFO:YDXAJO is symmetrical without fasciculations.   Motor: strength is 5/5 throughout.  Tone is normal. Bulk is increased.  Sensation- Intact to light touch bilaterally in all four extremities except no sensation to BLEs ankles to toes. Extinction absent to light touch to DSS.  Coordination: FTN intact bilaterally. Asterixis noted. His twitching episodes seem to occur more as he is reaching or moving his UEs.   CBC    Component Value Date/Time   WBC 6.0 02/22/2021 0221   RBC 3.30 (L) 02/22/2021 0221   HGB 10.9 (L) 02/22/2021 0221   HCT 34.3 (L) 02/22/2021 0221   PLT 161 02/22/2021 0221   MCV 103.9 (H) 02/22/2021 0221   MCH 33.0 02/22/2021 0221   MCHC 31.8 02/22/2021 0221   RDW 17.2 (H) 02/22/2021 0221   LYMPHSABS 0.9 02/22/2021 0221   MONOABS 0.5 02/22/2021 0221   EOSABS 0.2 02/22/2021 0221   BASOSABS 0.0 02/22/2021 0221    CMP     Component Value Date/Time   NA 140 02/23/2021 0752   NA 138 04/11/2018 1030   K 3.9 02/23/2021 0752   CL 99 02/23/2021 0752   CO2 34 (H) 02/23/2021 0752   GLUCOSE 95 02/23/2021 0752   BUN 34 (H) 02/23/2021 0752   BUN 66 (H) 04/11/2018 1030   CREATININE 1.68 (H) 02/23/2021 0752   CREATININE 1.10 07/08/2020 1323   CALCIUM 9.0 02/23/2021 0752   PROT 5.3 (L) 02/15/2021 0858   PROT 5.9 (L) 04/11/2018 1030   ALBUMIN 2.8 (L) 02/15/2021 0858    ALBUMIN 3.5 04/11/2018 1030   AST 27 02/15/2021 0858   ALT 19 02/15/2021 0858   ALKPHOS 48 02/15/2021 0858   BILITOT 1.3 (H) 02/15/2021 0858   BILITOT 0.4 04/11/2018 1030   GFRNONAA 41 (L) 02/23/2021 0752   GFRAA >60 04/20/2020 0152    Imaging MD reviewed the images obtained  CT head No sign of acute infarction or intracranial hemorrhage. Partially calcified meningioma the posterior frontal falx, more to the left side than the right, measuring 2.6 x 2.3 x 2.4 cm, a couple of mm larger than was seen last year. Mass-effect upon the left medial frontal lobe with mild brain edema. Though this abnormality is notable, I would not expect it to be associated with an acute mental status change.  Assessment: 80 yo male with a multitude of medical co morbidities who was admitted 9 days ago by medicine. Neurology asked to consult today for tremors/query seizure activity. His exam is consistent with asterixis but this is usually found in patient's with liver disease and also renal dysfunction. His abnormal movements began 5 weeks ago and have not been associated with GTC seizures. His movements are not rhythmical and are random to bilateral UEs. Given his EEG is negative for seizures or epileptiform discharges, his movements are likely not of seizure etiology.   Recommendations/Plan:  -No need for AEDs.  -would like a MRI brain but patient would need an open MRI as his body habitus is likely too large for a regular MRI. Plus, he states he can not lie flat for MRI due to his CHF and COPD.   Do not know what to make of his mouth tremor as it is not associated with any speech changes and does not coordinate with abnormal hand movements.    Pt seen by Clance Boll, NP/Neuro and later by MD. Note/plan to be edited by MD as needed.  Pager: [phone]

## 2021-02-23 NOTE — TOC Progression Note (Addendum)
Transition of Care [facility]) - Progression Note    Patient Details  Name: Conlin Brahm MRN: [id number] Date of Birth: [birth year]  Transition [facility] CM/SW [place], [place] Phone Number: 02/23/2021, 10:59 AM  Clinical Narrative:     Update- Patient has now chosen SNF placement at [facility]. Antoinette at [facility] confirmed they had a bipap machine for patient. CSW gave Antoinette respiratory settings for machine. Antoinette confirmed she will start insurance authorization for patient. CSW confirmed with Misty with [facility] to cancel insurance authorization for patient.  Patient has SNF bed at [facility]. Insurance authorization is pending.CSW will continue to follow.  Update-CSW heard back from [place] with [facility] who confirmed there are no Bipaps and they are unsure when they will get one in. CSW spoke with patient and patient wants CSW to check in with other SNF offer with [place] to see when they might can get a Bipap in. CSW called antonnette with [place] who is checking on this and is going to call CSW back.  CSW called and left voicemail for [place] with [facility] to return call. CSW tying to follow up on Bipap availability for patient. CSW awaiting callback. CSW will continue to follow and assist with discharge planning needs.    Expected Discharge Plan: [place] Barriers to Discharge: Continued Medical Work up  Expected Discharge Plan and Services Expected Discharge Plan: [place] In-house Referral: Clinical Social Work     Living arrangements for the past 2 months: Single Family Home                                       Social Determinants of Health (SDOH) Interventions    Readmission Risk Interventions No flowsheet data found.

## 2021-02-24 ENCOUNTER — Ambulatory Visit: Payer: Medicare HMO | Admitting: Internal Medicine

## 2021-02-24 DIAGNOSIS — J9601 Acute respiratory failure with hypoxia: Secondary | ICD-10-CM | POA: Diagnosis not present

## 2021-02-24 LAB — GLUCOSE, CAPILLARY: Glucose-Capillary: 129 mg/dL — ABNORMAL HIGH (ref 70–99)

## 2021-02-24 LAB — BASIC METABOLIC PANEL
Anion gap: 7 (ref 5–15)
BUN: 37 mg/dL — ABNORMAL HIGH (ref 8–23)
CO2: 35 mmol/L — ABNORMAL HIGH (ref 22–32)
Calcium: 8.6 mg/dL — ABNORMAL LOW (ref 8.9–10.3)
Chloride: 97 mmol/L — ABNORMAL LOW (ref 98–111)
Creatinine, Ser: 1.92 mg/dL — ABNORMAL HIGH (ref 0.61–1.24)
GFR, Estimated: 35 mL/min — ABNORMAL LOW (ref >60)
Glucose, Bld: 101 mg/dL — ABNORMAL HIGH (ref 70–99)
Potassium: 3.7 mmol/L (ref 3.5–5.1)
Sodium: 139 mmol/L (ref 135–145)

## 2021-02-24 NOTE — TOC Progression Note (Addendum)
Transition of Care [facility]) - Progression Note    Patient Details  Name: [name] MRN: [id number] Date of Birth: 1941/04/11  Transition of Care [facility]) CM/SW [place], [place] Phone Number: 02/24/2021, 2:10 PM  Clinical Narrative:     CSW received call from [facility] with [facility] who confirmed they cannot offer SNF bed for patient. CSW informed patient. CSW informed TOC leadership. TOC leadership is going to notify PT to see if they can work with patient more before dc.Patient agreed that he would like to work more with PT to get strength back before going home.Patient interested in working with PT in hopes to feel better about going home with home health services. CSW will continue to follow.  Update-CSW updated patient and let patient know that [place] can no longer offer SNF bed for patient. Patient gave CSW permission to let [facility] with [facility] look at this referral to see if they can  Offer SNF bed for patient. CSW awaiting callback from [facility] with [facility].  Update- CSW received another call from [place] with [facility] confirmed they reviewed referral and cannot accept patient for SNF placement. He also confirmed with CSW that they do not have a  Bipap. CSW asked why she was told they had bipap and they said they are not sure and that they are sorry. CSW will inform patient.  CSW received call from Antonette with [facility] that they can now not accept Patient. They say they are concerned that patient will return back to hospital due to patient being on trilogy and now bipap. CSW will update family. Plan to be determined.  Expected Discharge Plan: [place] Barriers to Discharge: Continued Medical Work up  Expected Discharge Plan and Services Expected Discharge Plan: [place] In-house Referral: Clinical Social Work     Living arrangements for the past 2 months:  Single Family Home                                       Social Determinants of Health (SDOH) Interventions    Readmission Risk Interventions No flowsheet data found.

## 2021-02-24 NOTE — Progress Notes (Signed)
PROGRESS NOTE    Kaevon Cotta  ASN:[id number] DOB: [birth year] DOA: 02/14/2021 PCP: [name], MD    Brief Narrative:  Keinan Brouillet is a 80 year old male with past medical history significant for paroxysmal atrial fibrillation, aortic stenosis s/p TAVR, chronic diastolic congestive heart failure, degenerative disc disease, gout, hyperlipidemia, hypertension, morbid obesity, OSA/hypoventilation syndrome, OA, CKD3a, peripheral neuropathy, CAD, history of prostate cancer, venous stasis dermatitis who presented to [facility] ED on 5/28 via EMS for weakness and dyspnea.  Patient reports was unable to get off the toilet with associated shortness of breath and generalized weakness.  Patient denies headache, no fever/chills, no nausea/vomiting/diarrhea, no palpitations, no diaphoresis, no abdominal pain.  Patient recently hospitalized at [facility] long hospital from  5/26 through 5/28 for decompensated OSA/OHS.  During that hospitalization, his BiPAP settings were optimized with a new facemask.  Patient also reports during the hospitalization he had very little mobility which he believes caused deconditioning when he returned home.  In the ED, temperature 99.2 F, HR 89, RR 29, BP 92/52, SPO2 97% on 3 L nasal cannula.  Sodium 137, potassium 3.9, chloride 94, CO2 36, glucose 127, BUN 32, creatinine 1.67.  WBC 10.6 (was 6.6 on 5/28 at 0127am), hemoglobin 12.2, platelets 131.  COVID-19 PCR negative.  Influenza A/B PCR negative.  Urinalysis with large leukocytes, negative nitrite, rare bacteria, greater than 50 WBCs.  VBG with pH 7.49, PaCO2 47.5, PaO2 56.  Chest x-ray with slight increase in right basilar airspace opacity compared to prior.  Blood cultures x2 ordered.  Patient started on cefepime and vancomycin in the ED for right lower lobe pneumonia.  TRH consulted for further evaluation and management of pneumonia and progressive weakness.    Assessment & Plan:   Principal Problem:   Acute  respiratory failure with hypoxia (HCC) Active Problems:   HYPERCHOLESTEROLEMIA   Obesity, Class III, BMI 40-49.9 (morbid obesity) (HCC)   Macrocytic anemia   Obstructive sleep apnea   COPD mixed type (HCC)   Essential hypertension   CKD stage 3 secondary to diabetes ([place])   Obesity hypoventilation syndrome (HCC)   Acute on chronic respiratory failure with hypoxemia (HCC)   Acute on chronic hypoxic respiratory failure, POA Community-acquired pneumonia Patient presenting to [facility] ED with complaints of productive cough with yellow sputum.  Chest x-ray shows progressive right basilar airspace opacity.  WBC elevated 10.6, up from 6.6 on discharge earlier in the day.  Lactic acid 1.4, procalcitonin elevated 0.44.  Patient was initially started on IV vancomycin, MRSA PCR was negative and was subsequently discontinued.  Patient completed 7-day course of IV cefepime. --Continue supplemental oxygen, maintain SPO2 greater than 88%, currently on 4L  (baseline requirement is 3L qHS w/ CPAP)  Pleural effusion CT chest without contrast with moderate right, small left pleural effusions with associated L ectasis and consolidation.  IR was consulted and patient underwent thoracentesis with 700 mL of serosanguineous fluid removed.  Pleural fluid cultures negative.  Cytology negative for malignant cells.  Acute renal failure on CKD stage IIIa Baseline creatinine 1.1-1.2.  On discharge on 02/14/2021 was 1.17 and presented with a creatinine of 1.65.  Etiology likely secondary to prerenal azotemia in the setting of furosemide use versus ATN with borderline hypotension. --Cr 1.67>>1.37>>1.68>1.92 --furosemide restarted 6/5, now with increased Cr, will hold --Avoid nephrotoxins, renally dose all medications --BMP daily  COPD, not in acute exacerbation Continue home Trelegy Ellipta 1 puff daily --Continue supplemental oxygen as above  Obstructive sleep apnea  Obesity hypoventilation  syndrome Recently discharged by pulmonary critical care medicine on 5/28 with adjustments in his home Bipap settings.  Also fitted with a new mask. --Continue nocturnal Bipap with 3 L O2   Chronic diastolic congestive heart failure, compensated On furosemide 20 mg p.o. twice daily at home.  Borderline hypotensive and notable for renal insufficiency on admission.  Chest x-ray with no pulmonary edema. --will hold furosemide with increased creatinine --Strict I's and O's Daily weights  Dyslipidemia: Continue atorvastatin 80 mg p.o. daily  Peripheral neuropathy --Gabapentin 300 mg p.o. twice daily  History of gout: Allopurinol 100 mg p.o. daily  Morbid obesity Body mass index is 48.97 kg/m.  Discussed with patient needs for aggressive lifestyle changes/weight loss as this complicates all facets of care.  Outpatient follow-up with PCP.  May benefit from bariatric evaluation outpatient.  Weakness/deconditioning/debility: Patient reports significant deconditioning since recent hospitalization.  Utilizes a walker at baseline.   --Continue PT/OT efforts while inpatient --Pending insurance authorization for SNF  Tremors upper extremities, unclear etiology Onset 02/22/2021.  CT head with partially calcified meningioma posterior frontal falx measuring 2.6 x 2.3 x 2.4 cm, mass-effect upon medial frontal lobe with mild brain edema, not significantly changed from last year.  No acute stroke.  Patient unable to tolerate MR given his morbid obesity and unable to lay flat due to his body habitus.  EEG with mild diffuse encephalopathy of nonspecific etiology, no seizures or epileptiform discharges noted. --Neurology consulted   DVT prophylaxis:  apixaban (ELIQUIS) tablet 5 mg    Code Status: Full Code Family Communication: No family present at bedside this morning  Disposition Plan:  Level of care: Telemetry Medical Status is: Inpatient  Remains inpatient appropriate because:Unsafe d/c  plan   Dispo:  Patient From: Home  Planned Disposition: [place]  Medically stable for discharge: No      Consultants:   Neurology  Interventional radiology  Procedures:   Right thoracentesis, IR 02/18/2021  Antimicrobials:   Vancomycin 5/29 - 5/29  Cefepime 5/29 - 6/4    Subjective: Patient seen examined bedside, resting comfortably.  No specific complaints this morning.  Awaiting insurance authorization for SNF.  No family present at bedside this morning.  Patient denies headache, no visual changes, no chest pain, no palpitations, no shortness of breath more than his typical baseline, no abdominal pain, no weakness, no fatigue, no paresthesias.  No acute events overnight per nursing staff.  Objective: Vitals:   02/23/21 2037 02/23/21 2246 02/24/21 0431 02/24/21 0834  BP: 111/77  122/69   Pulse: 83 64 (!) 59   Resp: 20 (!) 22 15   Temp: 98 F (36.7 C)  (!) 96.4 F (35.8 C)   TempSrc: Oral  Axillary   SpO2: 97% 97% 97% 96%  Weight:   (!) 173.8 kg   Height:        Intake/Output Summary (Last 24 hours) at 02/24/2021 1047 Last data filed at 02/24/2021 0727 Gross per 24 hour  Intake --  Output 400 ml  Net -400 ml   Filed Weights   02/15/21 0500 02/21/21 0536 02/24/21 0431  Weight: (!) 173 kg (!) 174.7 kg (!) 173.8 kg    Examination:  General exam: Appears calm and comfortable, obese Respiratory system: Breath sounds slightly decreased bilateral bases, no wheezes/crackles, normal respiratory effort on 4 L nasal cannula Cardiovascular system: S1 & S2 heard, RRR. No JVD, murmurs, rubs, gallops or clicks.  2+ pitting edema bilateral lower extremities to mid shin. Gastrointestinal  system: Abdomen is nondistended, soft and nontender. No organomegaly or masses felt. Normal bowel sounds heard. Central nervous system: Alert and oriented. No focal neurological deficits. Extremities: Symmetric 5 x 5 power. Skin: Chronic venous changes bilateral lower  extremities with cracking of skin, otherwise no other concerning rashes, lesions or ulcers Psychiatry: Judgement and insight appear normal. Mood & affect appropriate.       Data Reviewed: I have personally reviewed following labs and imaging studies  CBC: Recent Labs  Lab 02/18/21 0239 02/22/21 0221  WBC 6.5 6.0  NEUTROABS  --  4.3  HGB 10.4* 10.9*  HCT 33.3* 34.3*  MCV 106.1* 103.9*  PLT 116* 194   Basic Metabolic Panel: Recent Labs  Lab 02/21/21 0245 02/22/21 0221 02/22/21 1639 02/23/21 0752 02/24/21 0321  NA 140 140 141 140 139  K 3.8 3.8 3.9 3.9 3.7  CL 101 100 99 99 97*  CO2 31 31 34* 34* 35*  GLUCOSE 86 91 115* 95 101*  BUN 36* 35* 34* 34* 37*  CREATININE 1.46* 1.61* 1.59* 1.68* 1.92*  CALCIUM 8.5* 8.6* 9.0 9.0 8.6*  MG  --   --  2.1  --   --   PHOS  --   --  4.0  --   --    GFR: Estimated Creatinine Clearance: 53.3 mL/min (A) (by C-G formula based on SCr of 1.92 mg/dL (H)). Liver Function Tests: No results for input(s): AST, ALT, ALKPHOS, BILITOT, PROT, ALBUMIN in the last 168 hours. No results for input(s): LIPASE, AMYLASE in the last 168 hours. No results for input(s): AMMONIA in the last 168 hours. Coagulation Profile: No results for input(s): INR, PROTIME in the last 168 hours. Cardiac Enzymes: No results for input(s): CKTOTAL, CKMB, CKMBINDEX, TROPONINI in the last 168 hours. BNP (last 3 results) No results for input(s): PROBNP in the last 8760 hours. HbA1C: No results for input(s): HGBA1C in the last 72 hours. CBG: Recent Labs  Lab 02/22/21 1611  GLUCAP 116*   Lipid Profile: No results for input(s): CHOL, HDL, LDLCALC, TRIG, CHOLHDL, LDLDIRECT in the last 72 hours. Thyroid Function Tests: No results for input(s): TSH, T4TOTAL, FREET4, T3FREE, THYROIDAB in the last 72 hours. Anemia Panel: No results for input(s): VITAMINB12, FOLATE, FERRITIN, TIBC, IRON, RETICCTPCT in the last 72 hours. Sepsis Labs: No results for input(s): PROCALCITON,  LATICACIDVEN in the last 168 hours.  Recent Results (from the past 240 hour(s))  Resp Panel by RT-PCR (Flu A&B, Covid) Nasopharyngeal Swab     Status: None   Collection Time: 02/14/21  7:40 PM   Specimen: Nasopharyngeal Swab; Nasopharyngeal(NP) swabs in vial transport medium  Result Value Ref Range Status   SARS Coronavirus 2 by RT PCR NEGATIVE NEGATIVE Final    Comment: (NOTE) SARS-CoV-2 target nucleic acids are NOT DETECTED.  The SARS-CoV-2 RNA is generally detectable in upper respiratory specimens during the acute phase of infection. The lowest concentration of SARS-CoV-2 viral copies this assay can detect is 138 copies/mL. A negative result does not preclude SARS-Cov-2 infection and should not be used as the sole basis for treatment or other patient management decisions. A negative result may occur with  improper specimen collection/handling, submission of specimen other than nasopharyngeal swab, presence of viral mutation(s) within the areas targeted by this assay, and inadequate number of viral copies(<138 copies/mL). A negative result must be combined with clinical observations, patient history, and epidemiological information. The expected result is Negative.  Fact Sheet for Patients:  EntrepreneurPulse.com.au  Fact Sheet for  Healthcare Providers:  IncredibleEmployment.be  This test is no t yet approved or cleared by the [place] and  has been authorized for detection and/or diagnosis of SARS-CoV-2 by FDA under an Emergency Use Authorization (EUA). This EUA will remain  in effect (meaning this test can be used) for the duration of the COVID-19 declaration under Section 564(b)(1) of the Act, 21 U.S.C.section 360bbb-3(b)(1), unless the authorization is terminated  or revoked sooner.       Influenza A by PCR NEGATIVE NEGATIVE Final   Influenza B by PCR NEGATIVE NEGATIVE Final    Comment: (NOTE) The Xpert Xpress  SARS-CoV-2/FLU/RSV plus assay is intended as an aid in the diagnosis of influenza from Nasopharyngeal swab specimens and should not be used as a sole basis for treatment. Nasal washings and aspirates are unacceptable for Xpert Xpress SARS-CoV-2/FLU/RSV testing.  Fact Sheet for Patients: EntrepreneurPulse.com.au  Fact Sheet for Healthcare Providers: IncredibleEmployment.be  This test is not yet approved or cleared by the [place] FDA and has been authorized for detection and/or diagnosis of SARS-CoV-2 by FDA under an Emergency Use Authorization (EUA). This EUA will remain in effect (meaning this test can be used) for the duration of the COVID-19 declaration under Section 564(b)(1) of the Act, 21 U.S.C. section 360bbb-3(b)(1), unless the authorization is terminated or revoked.  Performed at [facility], [place] [address] [place], [place] [address]   Blood culture (routine x 2)     Status: None   Collection Time: 02/14/21  7:40 PM   Specimen: BLOOD LEFT WRIST  Result Value Ref Range Status   Specimen Description BLOOD LEFT WRIST  Final   Special Requests   Final    BOTTLES DRAWN AEROBIC AND ANAEROBIC Blood Culture adequate volume   Culture   Final    NO GROWTH 5 DAYS Performed at [facility], [place] [address], [place], [place] [address]    Report Status 02/19/2021 FINAL  Final  Blood culture (routine x 2)     Status: None   Collection Time: 02/15/21  1:20 AM   Specimen: BLOOD  Result Value Ref Range Status   Specimen Description BLOOD SITE NOT SPECIFIED  Final   Special Requests   Final    BOTTLES DRAWN AEROBIC AND ANAEROBIC Blood Culture adequate volume   Culture   Final    NO GROWTH 5 DAYS Performed at [facility], [address] [place], [place] [address]    Report Status 02/20/2021 FINAL  Final  MRSA PCR Screening     Status: None   Collection Time: 02/15/21  8:36 AM   Specimen: Nasopharyngeal  Result Value  Ref Range Status   MRSA by PCR NEGATIVE NEGATIVE Final    Comment:        The GeneXpert MRSA Assay (FDA approved for NASAL specimens only), is one component of a comprehensive MRSA colonization surveillance program. It is not intended to diagnose MRSA infection nor to guide or monitor treatment for MRSA infections. Performed at [facility], [place] [address] [place], [place] [address]   Body fluid culture w Gram Stain     Status: None   Collection Time: 02/18/21 10:34 AM   Specimen: Lung, Right; Pleural Fluid  Result Value Ref Range Status   Specimen Description PLEURAL FLUID  Final   Special Requests LUNG RIGHT  Final   Gram Stain   Final    ABUNDANT WBC PRESENT,BOTH PMN AND MONONUCLEAR NO ORGANISMS SEEN  Culture   Final    NO GROWTH 3 DAYS Performed at [facility], [place] [address], [place], [place] [address]    Report Status 02/21/2021 FINAL  Final         Radiology Studies: CT HEAD WO CONTRAST  Result Date: 02/22/2021 CLINICAL DATA:  Mental status changes of unknown cause. EXAM: CT HEAD WITHOUT CONTRAST TECHNIQUE: Contiguous axial images were obtained from the base of the skull through the vertex without intravenous contrast. COMPARISON:  04/15/2020 FINDINGS: Brain: The brainstem and cerebellum are normal. Again demonstrated is a partially calcified meningioma primarily along the left side of the falx in the posterior frontal region measuring 2.6 x 2.3 x 2.4 cm. Mild extension to the right side of the falx. Some mass-effect upon the medial left frontal lobe, with mild adjacent edema. No sign of acute infarction, intra-axial mass lesion, hemorrhage, hydrocephalus or extra-axial collection. Vascular: There is atherosclerotic calcification of the major vessels at the base of the brain. Skull: Negative Sinuses/Orbits: Clear/normal Other: None IMPRESSION: No sign of acute infarction or intracranial hemorrhage. Partially calcified meningioma the posterior frontal falx,  more to the left side than the right, measuring 2.6 x 2.3 x 2.4 cm, a couple of mm larger than was seen last year. Mass-effect upon the left medial frontal lobe with mild brain edema. Though this abnormality is notable, I would not expect it to be associated with an acute mental status change. Electronically Signed   By: Nelson Chimes M.D.   On: 02/22/2021 18:30   EEG adult  Result Date: 02/23/2021 Lora Havens, MD     02/23/2021  1:49 PM Patient Name: Alazar Cherian MRN: [id number] Epilepsy Attending: Lora Havens Referring Physician/Provider: Dr Hosie Poisson Date: 02/23/2021 Duration: 23.18 mins Patient history: 80 year old male with meningioma, altered mental status.  EEG done for seizures. Level of alertness: Awake AEDs during EEG study: Gabapentin Technical aspects: This EEG study was done with scalp electrodes positioned according to the 10-20 International system of electrode placement. Electrical activity was acquired at a sampling rate of 500Hz  and reviewed with a high frequency filter of 70Hz  and a low frequency filter of 1Hz . EEG data were recorded continuously and digitally stored. Description: The posterior dominant rhythm consists of 8 Hz activity of moderate voltage (25-35 uV) seen predominantly in posterior head regions, symmetric and reactive to eye opening and eye closing. EEG showed continuous generalized 5 to 7 Hz theta as well as intermittent generalized 2 to 3 Hz delta slowing. Hyperventilation and photic stimulation were not performed.   ABNORMALITY - Continuous slow, generalized IMPRESSION: This study is suggestive of mild diffuse encephalopathy, nonspecific etiology. No seizures or epileptiform discharges were seen throughout the recording. Priyanka Barbra Sarks        Scheduled Meds: . allopurinol  100 mg Oral Daily  . apixaban  5 mg Oral BID  . vitamin C  500 mg Oral Daily  . atorvastatin  80 mg Oral Daily  . famotidine  40 mg Oral Daily  . fluticasone furoate-vilanterol   1 puff Inhalation Daily   And  . umeclidinium bromide  1 puff Inhalation Daily  . furosemide  40 mg Oral Daily  . gabapentin  300 mg Oral BID  . polyethylene glycol  17 g Oral Daily   Continuous Infusions:   LOS: 9 days    Time spent: 35 minutes spent on chart review, discussion with nursing staff, consultants, updating family and interview/physical exam; more than 50% of that  time was spent in counseling and/or coordination of care.    Woodford Strege J [place] DO [facility] Available via Epic secure chat 7am-7pm After these hours, please refer to coverage provider listed on [email or web address] 02/24/2021, 10:47 AM

## 2021-02-25 DIAGNOSIS — R4182 Altered mental status, unspecified: Secondary | ICD-10-CM | POA: Diagnosis not present

## 2021-02-25 DIAGNOSIS — G253 Myoclonus: Secondary | ICD-10-CM | POA: Diagnosis not present

## 2021-02-25 DIAGNOSIS — J9601 Acute respiratory failure with hypoxia: Secondary | ICD-10-CM | POA: Diagnosis not present

## 2021-02-25 DIAGNOSIS — M4802 Spinal stenosis, cervical region: Secondary | ICD-10-CM | POA: Diagnosis not present

## 2021-02-25 LAB — BASIC METABOLIC PANEL
Anion gap: 8 (ref 5–15)
BUN: 39 mg/dL — ABNORMAL HIGH (ref 8–23)
CO2: 37 mmol/L — ABNORMAL HIGH (ref 22–32)
Calcium: 9.1 mg/dL (ref 8.9–10.3)
Chloride: 96 mmol/L — ABNORMAL LOW (ref 98–111)
Creatinine, Ser: 1.73 mg/dL — ABNORMAL HIGH (ref 0.61–1.24)
GFR, Estimated: 40 mL/min — ABNORMAL LOW (ref >60)
Glucose, Bld: 96 mg/dL (ref 70–99)
Potassium: 3.4 mmol/L — ABNORMAL LOW (ref 3.5–5.1)
Sodium: 141 mmol/L (ref 135–145)

## 2021-02-25 LAB — TSH: TSH: 1.393 u[IU]/mL (ref 0.350–4.500)

## 2021-02-25 LAB — GLUCOSE, CAPILLARY: Glucose-Capillary: 123 mg/dL — ABNORMAL HIGH (ref 70–99)

## 2021-02-25 MED ORDER — POTASSIUM CHLORIDE CRYS ER 20 MEQ PO TBCR
40.0000 meq | EXTENDED_RELEASE_TABLET | Freq: Once | ORAL | Status: AC
  Administered 2021-02-25: 40 meq via ORAL
  Filled 2021-02-25: qty 2

## 2021-02-25 NOTE — Progress Notes (Signed)
Spoke to Dr. [place] concerning possible need for [employer]. Also requested for PT to review need for [employer]. Doctor placed order for [employer].

## 2021-02-25 NOTE — Progress Notes (Addendum)
PROGRESS NOTE    [name]  HFW:[id number] DOB: 1941/01/08 DOA: 02/14/2021 PCP: [name], MD    Brief Narrative:  [name] is a 80 year old male with past medical history significant for paroxysmal atrial fibrillation, aortic stenosis s/p TAVR, chronic diastolic congestive heart failure, degenerative disc disease, gout, hyperlipidemia, hypertension, morbid obesity, OSA/hypoventilation syndrome, OA, CKD3a, peripheral neuropathy, CAD, history of prostate cancer, venous stasis dermatitis who presented to [facility] ED on 5/28 via EMS for weakness and dyspnea.  Patient reports was unable to get off the toilet with associated shortness of breath and generalized weakness.  Patient denies headache, no fever/chills, no nausea/vomiting/diarrhea, no palpitations, no diaphoresis, no abdominal pain.  Patient recently hospitalized at [facility] long hospital from  5/26 through 5/28 for decompensated OSA/OHS.  During that hospitalization, his BiPAP settings were optimized with a new facemask.  Patient also reports during the hospitalization he had very little mobility which he believes caused deconditioning when he returned home.  In the ED, temperature 99.2 F, HR 89, RR 29, BP 92/52, SPO2 97% on 3 L nasal cannula. Sodium 137, potassium 3.9, chloride 94, CO2 36, glucose 127, BUN 32, creatinine 1.67. WBC 10.6 (was 6.6 on 5/28 at 0127am), hemoglobin 12.2, platelets 131.  COVID-19 PCR negative.  Influenza A/B PCR negative.  Urinalysis with large leukocytes, negative nitrite, rare bacteria, greater than 50 WBCs.  VBG with pH 7.49, PaCO2 47.5, PaO2 56.  Chest x-ray with slight increase in right basilar airspace opacity compared to prior.  Blood cultures x2 ordered. Patient was started on cefepime and vancomycin in the ED for right lower lobe pneumonia. TRH consulted for further evaluation and management of pneumonia and progressive weakness.    Assessment & Plan:   Principal Problem:   Acute  respiratory failure with hypoxia (HCC) Active Problems:   HYPERCHOLESTEROLEMIA   Obesity, Class III, BMI 40-49.9 (morbid obesity) (HCC)   Macrocytic anemia   Obstructive sleep apnea   COPD mixed type (HCC)   Essential hypertension   CKD stage 3 secondary to diabetes ([place])   Obesity hypoventilation syndrome (HCC)   Acute on chronic respiratory failure with hypoxemia (HCC)   Acute on chronic hypoxic respiratory failure, POA Community-acquired pneumonia Patient presenting to [facility] ED with complaints of productive cough with yellow sputum.  Chest x-ray shows progressive right basilar airspace opacity.  WBC elevated 10.6, up from 6.6 on discharge earlier in the day.  Lactic acid 1.4, procalcitonin elevated 0.44.  Patient was initially started on IV vancomycin, MRSA PCR was negative and was subsequently discontinued.  Patient completed 7-day course of IV cefepime. --Continue supplemental oxygen, maintain SPO2 greater than 88%, currently on 3L [place] (baseline requirement is 3L qHS w/ CPAP) but will likely need continuous oxygen on discharge.  Pleural effusion CT chest without contrast with moderate right, small left pleural effusions with associated L ectasis and consolidation.  IR was consulted and patient underwent thoracentesis with 700 mL of serosanguineous fluid removed.  Pleural fluid cultures negative.  Cytology negative for malignant cells.  Acute renal failure on CKD stage IIIa Baseline creatinine 1.1-1.2.  On discharge on 02/14/2021 was 1.17 and presented with a creatinine of 1.65.  Etiology likely secondary to prerenal azotemia in the setting of furosemide use versus ATN with borderline hypotension. --Cr 1.67>>1.37>>1.68>1.92>1.73 --Continue to hold home furosemide --Avoid nephrotoxins, renally dose all medications --BMP daily  Hypokalemia Potassium 3.4 this morning, will replete. --Repeat electrolytes in a.m. to include magnesium  COPD, not in acute exacerbation  Continue  home Trelegy Ellipta 1 puff daily --Continue supplemental oxygen as above  Obstructive sleep apnea Obesity hypoventilation syndrome Recently discharged by pulmonary critical care medicine on 5/28 with adjustments in his home Bipap settings.  Also fitted with a new mask. --Continue nocturnal Bipap with 3 L O2   Chronic diastolic congestive heart failure, compensated On furosemide 20 mg p.o. twice daily at home.  Borderline hypotensive and notable for renal insufficiency on admission.  Chest x-ray with no pulmonary edema. --will hold furosemide with increased creatinine --Strict I's and O's Daily weights  Dyslipidemia: Continue atorvastatin 80 mg p.o. daily  Peripheral neuropathy --Gabapentin 300 mg p.o. twice daily  History of gout: Allopurinol 100 mg p.o. daily  Morbid obesity Body mass index is 48.97 kg/m.  Discussed with patient needs for aggressive lifestyle changes/weight loss as this complicates all facets of care.  Outpatient follow-up with PCP.  May benefit from bariatric evaluation outpatient.  Weakness/deconditioning/debility: Patient reports significant deconditioning since recent hospitalization.  Utilizes a walker at baseline.  Social work unable to find a suitable SNF for placement.  Plan for aggressive therapy while inpatient with plans to return home with home health. --Patient needs more aggressive PT/OT while inpatient in order to return home with home health  Tremors upper extremities, unclear etiology Onset 02/22/2021.  CT head with partially calcified meningioma posterior frontal falx measuring 2.6 x 2.3 x 2.4 cm, mass-effect upon medial frontal lobe with mild brain edema, not significantly changed from last year.  No acute stroke.  Patient unable to tolerate MR given his morbid obesity and unable to lay flat due to his body habitus.  EEG with mild diffuse encephalopathy of nonspecific etiology, no seizures or epileptiform discharges noted. --Neurology  following   DVT prophylaxis:  apixaban (ELIQUIS) tablet 5 mg    Code Status: Full Code Family Communication: Updated patient spouse, [name] via telephone this morning  Disposition Plan:  Level of care: Telemetry Medical Status is: Inpatient  Remains inpatient appropriate because:Unsafe d/c plan   Dispo:  Patient From: Home  Planned Disposition: Home with Health Care Svc  Medically stable for discharge: No      Consultants:   Neurology  Interventional radiology  Procedures:   Right thoracentesis, IR 02/18/2021  Antimicrobials:   Vancomycin 5/29 - 5/29  Cefepime 5/29 - 6/4    Subjective: Patient seen examined bedside, resting comfortably.  No specific complaints this morning other than he has not been working with PT enough, reports only been out of bed once earlier in admission using his walker.  Social work is unable to obtain SNF placement for him, now plan is to work with PT inpatient until can return home with home health.  No family present at bedside this morning.  Patient denies headache, no visual changes, no chest pain, no palpitations, no shortness of breath more than his typical baseline, no abdominal pain, no weakness, no fatigue, no paresthesias.  No acute events overnight per nursing staff.  Objective: Vitals:   02/25/21 0303 02/25/21 0444 02/25/21 0824 02/25/21 0830  BP:  135/66 114/76   Pulse: 60 (!) 59 70   Resp: 18 15 (!) 21   Temp:  97.6 F (36.4 C)    TempSrc:  Axillary    SpO2: 100% 100% 97% 98%  Weight:  (!) 171.8 kg    Height:        Intake/Output Summary (Last 24 hours) at 02/25/2021 1038 Last data filed at 02/24/2021 1618 Gross per 24 hour  Intake --  Output 500 ml  Net -500 ml   Filed Weights   02/21/21 0536 02/24/21 0431 02/25/21 0444  Weight: (!) 174.7 kg (!) 173.8 kg (!) 171.8 kg    Examination:  General exam: Appears calm and comfortable, obese Respiratory system: Breath sounds slightly decreased bilateral bases, no  wheezes/crackles, normal respiratory effort on 3 L nasal cannula Cardiovascular system: S1 & S2 heard, RRR. No JVD, murmurs, rubs, gallops or clicks.  2+ pitting edema bilateral lower extremities to mid shin. Gastrointestinal system: Abdomen is nondistended, soft and nontender. No organomegaly or masses felt. Normal bowel sounds heard. Central nervous system: Alert and oriented. No focal neurological deficits. Extremities: Symmetric 5 x 5 power. Skin: Chronic venous changes bilateral lower extremities with cracking of skin, otherwise no other concerning rashes, lesions or ulcers Psychiatry: Judgement and insight appear normal. [name] & affect appropriate.       Data Reviewed: I have personally reviewed following labs and imaging studies  CBC: Recent Labs  Lab 02/22/21 0221  WBC 6.0  NEUTROABS 4.3  HGB 10.9*  HCT 34.3*  MCV 103.9*  PLT 665   Basic Metabolic Panel: Recent Labs  Lab 02/22/21 0221 02/22/21 1639 02/23/21 0752 02/24/21 0321 02/25/21 0305  NA 140 141 140 139 141  K 3.8 3.9 3.9 3.7 3.4*  CL 100 99 99 97* 96*  CO2 31 34* 34* 35* 37*  GLUCOSE 91 115* 95 101* 96  BUN 35* 34* 34* 37* 39*  CREATININE 1.61* 1.59* 1.68* 1.92* 1.73*  CALCIUM 8.6* 9.0 9.0 8.6* 9.1  MG  --  2.1  --   --   --   PHOS  --  4.0  --   --   --    GFR: Estimated Creatinine Clearance: 58.7 mL/min (A) (by C-G formula based on SCr of 1.73 mg/dL (H)). Liver Function Tests: No results for input(s): AST, ALT, ALKPHOS, BILITOT, PROT, ALBUMIN in the last 168 hours. No results for input(s): LIPASE, AMYLASE in the last 168 hours. No results for input(s): AMMONIA in the last 168 hours. Coagulation Profile: No results for input(s): INR, PROTIME in the last 168 hours. Cardiac Enzymes: No results for input(s): CKTOTAL, CKMB, CKMBINDEX, TROPONINI in the last 168 hours. BNP (last 3 results) No results for input(s): PROBNP in the last 8760 hours. HbA1C: No results for input(s): HGBA1C in the last 72  hours. CBG: Recent Labs  Lab 02/22/21 1611 02/24/21 2120  GLUCAP 116* 129*   Lipid Profile: No results for input(s): CHOL, HDL, LDLCALC, TRIG, CHOLHDL, LDLDIRECT in the last 72 hours. Thyroid Function Tests: No results for input(s): TSH, T4TOTAL, FREET4, T3FREE, THYROIDAB in the last 72 hours. Anemia Panel: No results for input(s): VITAMINB12, FOLATE, FERRITIN, TIBC, IRON, RETICCTPCT in the last 72 hours. Sepsis Labs: No results for input(s): PROCALCITON, LATICACIDVEN in the last 168 hours.  Recent Results (from the past 240 hour(s))  Body fluid culture w Gram Stain     Status: None   Collection Time: 02/18/21 10:34 AM   Specimen: Lung, Right; Pleural Fluid  Result Value Ref Range Status   Specimen Description PLEURAL FLUID  Final   Special Requests LUNG RIGHT  Final   Gram Stain   Final    ABUNDANT WBC PRESENT,BOTH PMN AND MONONUCLEAR NO ORGANISMS SEEN    Culture   Final    NO GROWTH 3 DAYS Performed at [facility], [address], [place], [place] [address]    Report Status 02/21/2021 FINAL  Final  Radiology Studies: EEG adult  Result Date: 03/01/21 Lora Havens, MD     01-Mar-2021  1:49 PM Patient Name: [name] MRN: [id number] Epilepsy Attending: Lora Havens Referring Physician/Provider: Dr Hosie Poisson Date: 2021-03-01 Duration: 23.18 mins Patient history: 80 year old male with meningioma, altered mental status.  EEG done for seizures. Level of alertness: Awake AEDs during EEG study: Gabapentin Technical aspects: This EEG study was done with scalp electrodes positioned according to the 10-20 International system of electrode placement. Electrical activity was acquired at a sampling rate of 500Hz  and reviewed with a high frequency filter of 70Hz  and a low frequency filter of 1Hz . EEG data were recorded continuously and digitally stored. Description: The posterior dominant rhythm consists of 8 Hz activity of moderate voltage (25-35 uV) seen  predominantly in posterior head regions, symmetric and reactive to eye opening and eye closing. EEG showed continuous generalized 5 to 7 Hz theta as well as intermittent generalized 2 to 3 Hz delta slowing. Hyperventilation and photic stimulation were not performed.   ABNORMALITY - Continuous slow, generalized IMPRESSION: This study is suggestive of mild diffuse encephalopathy, nonspecific etiology. No seizures or epileptiform discharges were seen throughout the recording. Priyanka Barbra Sarks        Scheduled Meds: . allopurinol  100 mg Oral Daily  . apixaban  5 mg Oral BID  . vitamin C  500 mg Oral Daily  . atorvastatin  80 mg Oral Daily  . famotidine  40 mg Oral Daily  . fluticasone furoate-vilanterol  1 puff Inhalation Daily   And  . umeclidinium bromide  1 puff Inhalation Daily  . gabapentin  300 mg Oral BID  . polyethylene glycol  17 g Oral Daily   Continuous Infusions:   LOS: 10 days    Time spent: 35 minutes spent on chart review, discussion with nursing staff, consultants, updating family and interview/physical exam; more than 50% of that time was spent in counseling and/or coordination of care.    Daxen Lanum J [place] DO [facility] Available via Epic secure chat 7am-7pm After these hours, please refer to coverage provider listed on [email or web address] 02/25/2021, 10:38 AM

## 2021-02-25 NOTE — Progress Notes (Signed)
Rehab Admissions Coordinator Note:  Patient was screened by Cleatrice Burke for appropriateness for an Inpatient Acute Rehab Consult per therapy change in recommendation.  At this time, we are recommending Inpatient Rehab consult. I will place order per protocol.  Cleatrice Burke RN MSN 02/25/2021, 3:46 PM  I can be reached at [phone].

## 2021-02-25 NOTE — Progress Notes (Signed)
Neurology Progress Note  Subjective: NAEON No change in myoclonus  Exam: Vitals:   02/25/21 0824 02/25/21 0830  BP: 114/76   Pulse: 70   Resp: (!) 21   Temp:    SpO2: 97% 98%   Gen: In bed, NAD Resp: non-labored breathing, no acute distress Abd: soft, nt  NEURO:  Mental Status: A&Ox3  Speech/Language: speech is without dysarthria or aphasia. Naming, repetition, fluency, and comprehension intact.   Cranial Nerves:  II: PERRL. Visual fields full.  III, IV, VI: EOMI. Lid elevation symmetric and full.  V: sensation is intact and symmetrical to face.  VII: Smile is symmetrical. Able to puff cheeks and raise eyebrows.  VIII:hearing intact to voice IX, X: palate elevation is symmetric. Phonation normal.  XI: normal sternocleidomastoid and trapezius muscle strength OKH:TXHFSF is symmetrical without fasciculations.   Motor: strength is 5/5 throughout.  Tone is normal. Bulk is increased.  Sensation- Intact to light touch bilaterally in all four extremities except no sensation to BLEs ankles to toes. Extinction absent to light touch to DSS.  Coordination: FTN intact bilaterally. enhanced physiologic tremor (primarily positional) and dyssynchronous myoclonic jerks of BUE., exacerbated by holding arms outstretched.   Reflexes: 2+ BUE, 1+ BLE, toes mute bilat Gait: deferred  Impression: patient has enhanced physiologic tremor (primarily positional) and dyssynchronous myoclonic jerks of BUE., exacerbated by holding arms outstretched. Distribution is most c/w segmental spinal stenosis. Patient has extensive hx cervical spine disease (benign tumor removed from c spine 1992, extensive remote laminectomy C-6 to T-2). He has 5/5 strength in all extremities on examination although he c/o recent acute exacerbation of chronic neck pain too severe to consider undergoing MRI 2/2 pain and dyspnea when laying flat. I think that given the absence of focal neuro deficits on examination we do not need to  pursue this further as o/p; if he develops weakness localizing to c spine after discharge outpatient neurologist could arrange for open MRI. CT c spine would not change mgmt at this point. I counseled patient that spinal myoclonus is typically not very responsive to oral tx and recommended not treating it with medication unless it interferes with function (driving, feeding self, etc). He is in agreement. If it did begin to impair function would try keppra first line.   I will place amb referral to neurology for o/p f/u. Neurology will not continue to actively follow, but please re-engage if additional neurologic concerns arise.  Su Monks, MD [facility] [phone]  If 7pm- 7am, please page neurology on call as listed in [place].

## 2021-02-25 NOTE — Progress Notes (Signed)
Occupational Therapy Treatment Patient Details Name: [name] MRN: [id number] DOB: 01-02-1941 Today's Date: 02/25/2021    History of present illness Pt is a 80 y.o. male recently admitted to [facility] (02/12/21-02/14/21) with acute on chronic respiratory failure, now readmitted 02/14/21 due to weakness and SOB, unable to stand up from toilet requiring EMS call. Found to be hypoxic. Workup for acute on chronic hypoxic respiratory failure, possibly combination of acute on chronic dHF and PNA. PMH includes OSA, HTN, obesity, afib, CHF, venous stasis dermatitis.   OT comments  Pt making incremental progress this session with OT goals. Pt and OT focused session on bed mobility and exercises for strengthening as a precursor to standing. Pt was motivated to participate and completed all exercises. The bed was utilized in multiple positions to simulate home, as well as in trendelenburg and chair position. These positions allowed for weightbearing and resistance in different areas of the body to work on strengthening, as well as maximizing pt's ability to complete bed mobility and prepare to stand. DC recommendation has been updated to CIR, as pt would benefit from intensive therapies to maximize his potential and safety prior to returning home. Acute OT will continue to follow while in the hospital to assist pt with progressing his goals and increasing his safety prior to returning home.    Follow Up Recommendations  CIR    Equipment Recommendations  None recommended by OT    Recommendations for Other Services Rehab consult    Precautions / Restrictions Precautions Precautions: Fall Precaution Comments: watch O2 Required Braces or Orthoses: Other Brace Other Brace: Has bilateral knee braces/compression wrap (for stability); left knee hinged brace then places elastic sleeve over brace; rt knee prefers ace wrap tightly for support Restrictions Weight Bearing Restrictions: No       Mobility Bed  Mobility Overal bed mobility: Needs Assistance Bed Mobility: Rolling Rolling: Min guard         General bed mobility comments: Pt working on bending BLE, pulling himself to 1 side with BUE, and pulling hip off bed. Pt completed this exercise on both sides, 10 times.    Transfers                 General transfer comment: Deferred transfers this session, focused on bed mobility and strengthening    Balance                                           ADL either performed or assessed with clinical judgement   ADL                                         General ADL Comments: Pt and OT focused session on bed level exercises/ strength training and bed mobility.     Vision       Perception     Praxis      Cognition Arousal/Alertness: Awake/alert Behavior During Therapy: WFL for tasks assessed/performed Overall Cognitive Status: Within Functional Limits for tasks assessed                                 General Comments: Verbose with conversation requiring redirection to task        Exercises  Exercises: Other exercises Other Exercises Other Exercises: segmental rolling, both sides, 10 reps, bringing hips to 1 side then the other, working on lifting hip off the bed when rolled to 1 side. Other Exercises: mini squats against foot plate, bed in trendelenburg to allow for some weight bearing, 20 reps Other Exercises: bridge pose, 10 reps, therapists assists with keeping feet in place Other Exercises: Lifting cross body with bed at 30 degrees, 10 reps, bringing shoulders off bed on both sides Other Exercises: bed in chair position, pt pulling himself forwards, then OT providing resistance w/ sheet as he pushes back, 5 reps   Shoulder Instructions       General Comments VSS on 3L, dropped to 1.5L at end of session due to O2 sats at 100%, RN notified.    Pertinent Vitals/ Pain       Pain Assessment: Faces Faces Pain  Scale: Hurts a little bit Pain Location: Bilateral knees Pain Descriptors / Indicators: Grimacing Pain Intervention(s): Monitored during session;Repositioned  Home Living                                          Prior Functioning/Environment              Frequency  Min 5X/week        Progress Toward Goals  OT Goals(current goals can now be found in the care plan section)  Progress towards OT goals: Progressing toward goals  Acute Rehab OT Goals Patient Stated Goal: to get to rehab OT Goal Formulation: With patient/family Time For Goal Achievement: 03/03/21 Potential to Achieve Goals: Good ADL Goals Pt Will Perform Lower Body Bathing: with mod assist;sitting/lateral leans Pt Will Perform Lower Body Dressing: with mod assist;sitting/lateral leans Pt Will Transfer to Toilet: with max assist;squat pivot transfer Pt Will Perform Toileting - Clothing Manipulation and hygiene: with mod assist;with adaptive equipment;sit to/from stand Additional ADL Goal #1: Pt will complete bed mobility with Min A.  Plan Discharge plan remains appropriate;Frequency remains appropriate    Co-evaluation                 AM-PAC OT "6 Clicks" Daily Activity     Outcome Measure   Help from another person eating meals?: A Little Help from another person taking care of personal grooming?: A Little Help from another person toileting, which includes using toliet, bedpan, or urinal?: Total Help from another person bathing (including washing, rinsing, drying)?: A Lot Help from another person to put on and taking off regular upper body clothing?: A Little Help from another person to put on and taking off regular lower body clothing?: A Lot 6 Click Score: 14    End of Session Equipment Utilized During Treatment: Oxygen  OT Visit Diagnosis: Muscle weakness (generalized) (M62.81);Other abnormalities of gait and mobility (R26.89);Unsteadiness on feet (R26.81)   Activity  Tolerance Patient tolerated treatment well   Patient Left in bed;with call bell/phone within reach;with family/visitor present   Nurse Communication Mobility status        Time: 0071-2197 OT Time Calculation (min): 37 min  Charges: OT General Charges $OT Visit: 1 Visit OT Treatments $Therapeutic Activity: 23-37 mins  Ryken Paschal H., OTR/L Acute Rehabilitation  Shakhia Gramajo Elane Jarren Para 02/25/2021, 4:55 PM

## 2021-02-25 NOTE — Progress Notes (Addendum)
Physical Therapy Treatment Patient Details Name: [name] MRN: [id number] DOB: August 03, 1941 Today's Date: 02/25/2021    History of Present Illness Pt is a 80 y.o. male recently admitted to [facility] (02/12/21-02/14/21) with acute on chronic respiratory failure, now readmitted 02/14/21 due to weakness and SOB, unable to stand up from toilet requiring EMS call. Found to be hypoxic. Workup for acute on chronic hypoxic respiratory failure, possibly combination of acute on chronic dHF and PNA. PMH includes OSA, HTN, obesity, afib, CHF, venous stasis dermatitis.    PT Comments    Patient eager to work with therapies. Is able to direct staff for donning his knee braces (on left knee) and ace wrap to rt knee. Bed mobility remains difficult as he reports his bed at home is very firm and he has room to move legs off and pivot to be perpendicular to mattress and then push up on his elbows (a technique we cannot mimick on hospital bed. Patient able to stand x 2 from elevated bed (similar to his bed at home) with min assist of 2 people. Can weight bear fully on LLE (with bil UE support on platform RW) to advance/move RLE, however cannot do the opposite. As becomes fatigued the "tremors" in his legs worsen. It appears he momentarily loses muscle tone and then regains tone--?asterixis. Will notify MD. Also updating discharge recommendation to CIR and Rehab Admissions Coordinator, Caitlyn, notified.   Follow Up Recommendations  CIR     Equipment Recommendations  None recommended by PT    Recommendations for Other Services Rehab consult     Precautions / Restrictions Precautions Precautions: Fall Precaution Comments: watch O2 Required Braces or Orthoses: Other Brace Other Brace: Has bilateral knee braces/compression wrap (for stability); left knee hinged brace then places elastic sleeve over brace; rt knee prefers ace wrap tightly for support    Mobility  Bed Mobility Overal bed mobility: Needs  Assistance Bed Mobility: Supine to Sit;Sit to Supine     Supine to sit: Max assist;+2 for physical assistance Sit to supine: Max assist;+2 for physical assistance   General bed mobility comments: pt able to get legs over EOB however requires incr assist to raise torso to sitting (he is used to firm mattress and king-size bed where he has room to pivot and then can push up on his elbows to sitting); posterior lean with bed low, pt able to correct with bed elevated (less compression of abdomen); return to supine requires 1 person assist to each leg and pivot on his back, then pull up to [facility] with bed in trendelenburg    Transfers Overall transfer level: Needs assistance Equipment used: Bilateral platform walker Transfers: Sit to/from Stand Sit to Stand: From elevated surface;Min assist;+2 safety/equipment         General transfer comment: with bil knees braced/wrapped, shoes, and bed elevated pt able to stand twice (1x >60 seconds; 1x 15 seconds). As knees fatigue, see increased ?asterixis/bil knee buckling and then catching  Ambulation/Gait             General Gait Details: unable this session; pt can take full weight on bil UEs and LLE to allow him to move/advance RLE, however can't do the opposite   Stairs             Wheelchair Mobility    Modified Rankin (Stroke Patients Only)       Balance Overall balance assessment: Needs assistance Sitting-balance support: Feet supported (bed elevated) Sitting balance-Leahy Scale: Fair Sitting balance -  Comments: Requires assist with initial sitting. Posterior leaning. Able to correct when cued.     Standing balance-Leahy Scale: Poor Standing balance comment: Heavy reliance on BUE support as well as right leg locked in extension                            Cognition Arousal/Alertness: Awake/alert Behavior During Therapy: WFL for tasks assessed/performed Overall Cognitive Status: Within Functional Limits for  tasks assessed                                 General Comments: Verbose with conversation requiring redirection to task      Exercises      General Comments General comments (skin integrity, edema, etc.): Encouraged pt to perform bed-level exercises on his own. He reports he has been doing leg lifts and hip abduction. Discussed possible use of tilt bed and pro's/con's with ultimate decision to try to increase his therapy time since he can stand. Referring to [facility] program      Pertinent Vitals/Pain Pain Assessment: No/denies pain Faces Pain Scale: Hurts a little bit    Home Living                      Prior Function            PT Goals (current goals can now be found in the care plan section) Acute Rehab PT Goals Patient Stated Goal: to get to rehab Time For Goal Achievement: 03/01/21 Potential to Achieve Goals: Good Progress towards PT goals: Progressing toward goals (slow progress)    Frequency    Min 3X/week      PT Plan Discharge plan needs to be updated;Frequency needs to be updated    Co-evaluation              AM-PAC PT "6 Clicks" Mobility   Outcome Measure  Help needed turning from your back to your side while in a flat bed without using bedrails?: Total Help needed moving from lying on your back to sitting on the side of a flat bed without using bedrails?: Total Help needed moving to and from a bed to a chair (including a wheelchair)?: Total Help needed standing up from a chair using your arms (e.g., wheelchair or bedside chair)?: Total Help needed to walk in hospital room?: Total Help needed climbing 3-5 steps with a railing? : Total 6 Click Score: 6    End of Session Equipment Utilized During Treatment: Oxygen Activity Tolerance: Patient limited by fatigue Patient left: in bed;with call bell/phone within reach;with bed alarm set   PT Visit Diagnosis: Other abnormalities of gait and mobility (R26.89)     Time:  5397-6734 PT Time Calculation (min) (ACUTE ONLY): 59 min  Charges:  $Gait Training: 8-22 mins $Therapeutic Activity: 23-37 mins $Self Care/Home Management: 8-22                      Arby Barrette, PT Pager [phone]    Rexanne Mano 02/25/2021, 12:47 PM

## 2021-02-26 DIAGNOSIS — J9601 Acute respiratory failure with hypoxia: Secondary | ICD-10-CM | POA: Diagnosis not present

## 2021-02-26 LAB — BASIC METABOLIC PANEL
Anion gap: 7 (ref 5–15)
BUN: 33 mg/dL — ABNORMAL HIGH (ref 8–23)
CO2: 35 mmol/L — ABNORMAL HIGH (ref 22–32)
Calcium: 9.2 mg/dL (ref 8.9–10.3)
Chloride: 97 mmol/L — ABNORMAL LOW (ref 98–111)
Creatinine, Ser: 1.46 mg/dL — ABNORMAL HIGH (ref 0.61–1.24)
GFR, Estimated: 49 mL/min — ABNORMAL LOW (ref >60)
Glucose, Bld: 95 mg/dL (ref 70–99)
Potassium: 3.6 mmol/L (ref 3.5–5.1)
Sodium: 139 mmol/L (ref 135–145)

## 2021-02-26 LAB — MAGNESIUM: Magnesium: 2.1 mg/dL (ref 1.7–2.4)

## 2021-02-26 MED ORDER — POTASSIUM CHLORIDE CRYS ER 20 MEQ PO TBCR
20.0000 meq | EXTENDED_RELEASE_TABLET | Freq: Once | ORAL | Status: AC
  Administered 2021-02-26: 20 meq via ORAL
  Filled 2021-02-26: qty 1

## 2021-02-26 NOTE — Progress Notes (Signed)
RT note. Patient placed on home trilogy, sat 95% w/ stable VS. RT will continue to monitor

## 2021-02-26 NOTE — Progress Notes (Signed)
Remote pacemaker transmission.   

## 2021-02-26 NOTE — Progress Notes (Signed)
PROGRESS NOTE    [name]  SWF:[id number] DOB: 1941/02/14 DOA: 02/14/2021 PCP: [name], MD    Brief Narrative:  [name] is a 80 year old male with past medical history significant for paroxysmal atrial fibrillation, aortic stenosis s/p TAVR, chronic diastolic congestive heart failure, degenerative disc disease, gout, hyperlipidemia, hypertension, morbid obesity, OSA/hypoventilation syndrome, OA, CKD3a, peripheral neuropathy, CAD, history of prostate cancer, venous stasis dermatitis who presented to [facility] ED on 5/28 via EMS for weakness and dyspnea.  Patient reports was unable to get off the toilet with associated shortness of breath and generalized weakness.  Patient denies headache, no fever/chills, no nausea/vomiting/diarrhea, no palpitations, no diaphoresis, no abdominal pain.   Patient recently hospitalized at [facility] long hospital from  5/26 through 5/28 for decompensated OSA/OHS.  During that hospitalization, his BiPAP settings were optimized with a new facemask.  Patient also reports during the hospitalization he had very little mobility which he believes caused deconditioning when he returned home.   In the ED, temperature 99.2 F, HR 89, RR 29, BP 92/52, SPO2 97% on 3 L nasal cannula. Sodium 137, potassium 3.9, chloride 94, CO2 36, glucose 127, BUN 32, creatinine 1.67. WBC 10.6 (was 6.6 on 5/28 at 0127am), hemoglobin 12.2, platelets 131.  COVID-19 PCR negative.  Influenza A/B PCR negative.  Urinalysis with large leukocytes, negative nitrite, rare bacteria, greater than 50 WBCs.  VBG with pH 7.49, PaCO2 47.5, PaO2 56.  Chest x-ray with slight increase in right basilar airspace opacity compared to prior.  Blood cultures x2 ordered. Patient was started on cefepime and vancomycin in the ED for right lower lobe pneumonia. TRH consulted for further evaluation and management of pneumonia and progressive weakness.    Assessment & Plan:   Principal Problem:   Acute  respiratory failure with hypoxia (HCC) Active Problems:   HYPERCHOLESTEROLEMIA   Obesity, Class III, BMI 40-49.9 (morbid obesity) (HCC)   Macrocytic anemia   Obstructive sleep apnea   COPD mixed type (HCC)   Essential hypertension   CKD stage 3 secondary to diabetes (HCC)   Obesity hypoventilation syndrome (HCC)   Acute on chronic respiratory failure with hypoxemia (HCC)   Cervical stenosis of spine   Spinal cord myoclonus   Acute on chronic hypoxic respiratory failure, POA Community-acquired pneumonia Patient presenting to [facility] ED with complaints of productive cough with yellow sputum.  Chest x-ray shows progressive right basilar airspace opacity.  WBC elevated 10.6, up from 6.6 on discharge earlier in the day.  Lactic acid 1.4, procalcitonin elevated 0.44.  Patient was initially started on IV vancomycin, MRSA PCR was negative and was subsequently discontinued.  Patient completed 7-day course of IV cefepime. --Continue supplemental oxygen, maintain SPO2 greater than 88%, currently on 3L [place] (baseline requirement is 3L qHS w/ CPAP) but will likely need continuous oxygen on discharge.  Pleural effusion CT chest without contrast with moderate right, small left pleural effusions with associated L ectasis and consolidation.  IR was consulted and patient underwent thoracentesis with 700 mL of serosanguineous fluid removed.  Pleural fluid cultures negative.  Cytology negative for malignant cells.   Acute renal failure on CKD stage IIIa Baseline creatinine 1.1-1.2.  On discharge on 02/14/2021 was 1.17 and presented with a creatinine of 1.65.  Etiology likely secondary to prerenal azotemia in the setting of furosemide use versus ATN with borderline hypotension. --Cr 1.67>>1.37>>1.68>1.92>1.73>1.46 --Continue to hold home furosemide --Avoid nephrotoxins, renally dose all medications --BMP daily  Hypokalemia Potassium 3.6 this morning, will  replete. --Repeat electrolytes in a.m. to include  magnesium   COPD, not in acute exacerbation Continue home Trelegy Ellipta 1 puff daily --Continue supplemental oxygen as above   Obstructive sleep apnea Obesity hypoventilation syndrome Recently discharged by pulmonary critical care medicine on 5/28 with adjustments in his home Bipap settings.  Also fitted with a new mask. --Continue nocturnal Bipap with 3 L O2    Chronic diastolic congestive heart failure, compensated On furosemide 20 mg p.o. twice daily at home.  Borderline hypotensive and notable for renal insufficiency on admission.  Chest x-ray with no pulmonary edema. --continue to hold furosemide with increased creatinine --Strict I's and O's Daily weights   Dyslipidemia: Continue atorvastatin 80 mg p.o. daily   Peripheral neuropathy --Gabapentin 300 mg p.o. twice daily   History of gout: Allopurinol 100 mg p.o. daily   Morbid obesity Body mass index is 48.97 kg/m.  Discussed with patient needs for aggressive lifestyle changes/weight loss as this complicates all facets of care.  Outpatient follow-up with PCP.  May benefit from bariatric evaluation outpatient.   Weakness/deconditioning/debility: Patient reports significant deconditioning since recent hospitalization.  Utilizes a walker at baseline.  Social work unable to find a suitable SNF for placement.  Plan for aggressive therapy while inpatient with plans to return home with home health vs possible CIR admission  Physiologic tremor likely secondary to spinal myoclonus CT head with partially calcified meningioma posterior frontal falx measuring 2.6 x 2.3 x 2.4 cm, mass-effect upon medial frontal lobe with mild brain edema, not significantly changed from last year.  No acute stroke.  Patient unable to tolerate MR given his morbid obesity and unable to lay flat due to his body habitus.  EEG with mild diffuse encephalopathy of nonspecific etiology, no seizures or epileptiform discharges noted.  Seen by neurology with patient  known history of segmental spinal stenosis and cervical spine disease and this is likely related to spinal myoclonus; but given no recent acute exacerbation of chronic neck pain and patient unable to tolerate MRI due to pain and dyspnea while lying flat, recommended outpatient follow-up with neurology for arrangement of open MRI.   DVT prophylaxis:  apixaban (ELIQUIS) tablet 5 mg    Code Status: Full Code Family Communication: No family present at bedside this morning, updated patient's spouse [name] via telephone yesterday  Disposition Plan:  Level of care: Telemetry Medical Status is: Inpatient  Remains inpatient appropriate because:Unsafe d/c plan   Dispo:  Patient From: Home  Planned Disposition: Home with Health Care Svc  Medically stable for discharge: No      Consultants:  Neurology Interventional radiology  Procedures:  Right thoracentesis, IR 02/18/2021  Antimicrobials:  Vancomycin 5/29 - 5/29 Cefepime 5/29 - 6/4    Subjective: Patient seen examined bedside, resting comfortably.  RN present.  Worked twice with physical therapy yesterday.  No other specific complaints or concerns at this time.  Possible CIR admission versus home health when deconditioning improved. Patient denies headache, no visual changes, no chest pain, no palpitations, no shortness of breath more than his typical baseline, no abdominal pain, no weakness, no fatigue, no paresthesias.  No acute events overnight per nursing staff.  Objective: Vitals:   02/25/21 2135 02/25/21 2240 02/26/21 0521 02/26/21 0814  BP: (!) 126/50  (!) 117/54 (!) 143/60  Pulse: 70 62 60 69  Resp: (!) 25 18 17 17   Temp: 97.8 F (36.6 C)  (!) 97.5 F (36.4 C) 98.1 F (36.7 C)  TempSrc: Oral  Axillary Oral  SpO2: 95% 99% 90% 92%  Weight:      Height:        Intake/Output Summary (Last 24 hours) at 02/26/2021 1006 Last data filed at 02/26/2021 0914 Gross per 24 hour  Intake 600 ml  Output 220 ml  Net 380 ml   Filed  Weights   02/21/21 0536 02/24/21 0431 02/25/21 0444  Weight: (!) 174.7 kg (!) 173.8 kg (!) 171.8 kg    Examination:  General exam: Appears calm and comfortable, obese Respiratory system: Breath sounds slightly decreased bilateral bases, no wheezes/crackles, normal respiratory effort on 3 L nasal cannula Cardiovascular system: S1 & S2 heard, RRR. No JVD, murmurs, rubs, gallops or clicks.  2+ pitting edema bilateral lower extremities to mid shin. Gastrointestinal system: Abdomen is nondistended, soft and nontender. No organomegaly or masses felt. Normal bowel sounds heard. Central nervous system: Alert and oriented. No focal neurological deficits. Extremities: Symmetric 5 x 5 power. Skin: Chronic venous changes bilateral lower extremities with cracking of skin, otherwise no other concerning rashes, lesions or ulcers Psychiatry: Judgement and insight appear normal. Mood & affect appropriate.       Data Reviewed: I have personally reviewed following labs and imaging studies  CBC: Recent Labs  Lab 02/22/21 0221  WBC 6.0  NEUTROABS 4.3  HGB 10.9*  HCT 34.3*  MCV 103.9*  PLT 301   Basic Metabolic Panel: Recent Labs  Lab 02/22/21 1639 02/23/21 0752 02/24/21 0321 02/25/21 0305 02/26/21 0323  NA 141 140 139 141 139  K 3.9 3.9 3.7 3.4* 3.6  CL 99 99 97* 96* 97*  CO2 34* 34* 35* 37* 35*  GLUCOSE 115* 95 101* 96 95  BUN 34* 34* 37* 39* 33*  CREATININE 1.59* 1.68* 1.92* 1.73* 1.46*  CALCIUM 9.0 9.0 8.6* 9.1 9.2  MG 2.1  --   --   --  2.1  PHOS 4.0  --   --   --   --    GFR: Estimated Creatinine Clearance: 69.6 mL/min (A) (by C-G formula based on SCr of 1.46 mg/dL (H)). Liver Function Tests: No results for input(s): AST, ALT, ALKPHOS, BILITOT, PROT, ALBUMIN in the last 168 hours. No results for input(s): LIPASE, AMYLASE in the last 168 hours. No results for input(s): AMMONIA in the last 168 hours. Coagulation Profile: No results for input(s): INR, PROTIME in the last 168  hours. Cardiac Enzymes: No results for input(s): CKTOTAL, CKMB, CKMBINDEX, TROPONINI in the last 168 hours. BNP (last 3 results) No results for input(s): PROBNP in the last 8760 hours. HbA1C: No results for input(s): HGBA1C in the last 72 hours. CBG: Recent Labs  Lab 02/22/21 1611 02/24/21 2120 02/25/21 2128  GLUCAP 116* 129* 123*   Lipid Profile: No results for input(s): CHOL, HDL, LDLCALC, TRIG, CHOLHDL, LDLDIRECT in the last 72 hours. Thyroid Function Tests: No results for input(s): TSH, T4TOTAL, FREET4, T3FREE, THYROIDAB in the last 72 hours. Anemia Panel: No results for input(s): VITAMINB12, FOLATE, FERRITIN, TIBC, IRON, RETICCTPCT in the last 72 hours. Sepsis Labs: No results for input(s): PROCALCITON, LATICACIDVEN in the last 168 hours.  Recent Results (from the past 240 hour(s))  Body fluid culture w Gram Stain     Status: None   Collection Time: 02/18/21 10:34 AM   Specimen: Lung, Right; Pleural Fluid  Result Value Ref Range Status   Specimen Description PLEURAL FLUID  Final   Special Requests LUNG RIGHT  Final   Gram Stain   Final    ABUNDANT WBC PRESENT,BOTH  PMN AND MONONUCLEAR NO ORGANISMS SEEN    Culture   Final    NO GROWTH 3 DAYS Performed at [facility], [place] [address] [place], [place] [address]    Report Status 02/21/2021 FINAL  Final         Radiology Studies: No results found.       Scheduled Meds:  allopurinol  100 mg Oral Daily   apixaban  5 mg Oral BID   vitamin C  500 mg Oral Daily   atorvastatin  80 mg Oral Daily   famotidine  40 mg Oral Daily   fluticasone furoate-vilanterol  1 puff Inhalation Daily   And   umeclidinium bromide  1 puff Inhalation Daily   gabapentin  300 mg Oral BID   polyethylene glycol  17 g Oral Daily   Continuous Infusions:   LOS: 11 days    Time spent: 35 minutes spent on chart review, discussion with nursing staff, consultants, updating family and interview/physical exam; more than 50% of  that time was spent in counseling and/or coordination of care.    Kaydin Karbowski J [place] DO [facility] Available via Epic secure chat 7am-7pm After these hours, please refer to coverage provider listed on [email or web address] 02/26/2021, 10:06 AM

## 2021-02-26 NOTE — Care Management Important Message (Signed)
Important Message  Patient Details  Name: [name] MRN: [id number] Date of Birth: February 22, 1941   Medicare Important Message Given:  Yes     Shelda Altes 02/26/2021, 12:24 PM

## 2021-02-26 NOTE — Plan of Care (Signed)

## 2021-02-26 NOTE — PMR Pre-admission (Signed)
PMR Admission Coordinator Pre-Admission Assessment  Patient: [name] is an 80 y.o., male MRN: [id number] DOB: [birth year] Height: '6\' 2"'  (188 cm) Weight: (!) 171.8 kg  Insurance Information HMO:   PPO:  PPO premier     PCP:       IPA:       80/20:       OTHER: Group [id number] [place] PRIMARY: Bernadene Person       Policy#: [id number]      Subscriber: patient CM Name: Ubaldo Glassing      Phone#: [phone]     Fax#: [phone] I received authorization from [facility] at [facility] on 03/03/21 for admission 03/02/21-03/08/21 with updates due on 4/70/96 Pre-Cert#: [id number]      Employer: Retired Benefits:  Phone #: [phone]     Name: [email or web address] Eff. Date: 09/20/20     Deduct: $0      Out of Pocket Max: $4500 (met $1501.80)      Life Max: N/A CIR: $295/day copay for 6 days with max $1770/admission      SNF: $188/day Outpatient:       Co-Pay: $35/visit copay Home Health: 100%      Co-Pay: none DME: 80%     Co-Pay: 20% Providers: in network  SECONDARY: None  Financial Counselor:        Phone#:    The Engineer, [employer]" for patients in Inpatient Rehabilitation Facilities with attached "Privacy Act [place] Records" was provided and verbally reviewed with: Patient and Family  Emergency Contact Information Contact Information     Name Relation Home Work Mobile   [name] Spouse [phone]  [phone]   Bralen, [place]   [phone]       Current Medical History  Patient Admitting Diagnosis: Debility, Respiratory Failure  History of Present Illness:  A 80 year old male with past medical history significant for paroxysmal atrial fibrillation, aortic stenosis s/p TAVR, chronic diastolic congestive heart failure, degenerative disc disease, gout, hyperlipidemia, hypertension, morbid obesity, OSA/hypoventilation syndrome, OA, CKD3a, peripheral neuropathy, CAD, history of prostate cancer, venous stasis dermatitis who presented to [facility] ED on  5/28 via EMS for weakness and dyspnea.  Patient reports was unable to get off the toilet with associated shortness of breath and generalized weakness.  Patient denies headache, no fever/chills, no nausea/vomiting/diarrhea, no palpitations, no diaphoresis, no abdominal pain.   Patient recently hospitalized at [facility] long hospital from  5/26 through 5/28 for decompensated OSA/OHS.  During that hospitalization, his BiPAP settings were optimized with a new facemask.  Patient also reports during the hospitalization he had very little mobility which he believes caused deconditioning when he returned home.   In the ED, temperature 99.2 F, HR 89, RR 29, BP 92/52, SPO2 97% on 3 L nasal cannula. Sodium 137, potassium 3.9, chloride 94, CO2 36, glucose 127, BUN 32, creatinine 1.67. WBC 10.6 (was 6.6 on 5/28 at 0127am), hemoglobin 12.2, platelets 131.  COVID-19 PCR negative.  Influenza A/B PCR negative.  Urinalysis with large leukocytes, negative nitrite, rare bacteria, greater than 50 WBCs.  VBG with pH 7.49, PaCO2 47.5, PaO2 56.  Chest x-ray with slight increase in right basilar airspace opacity compared to prior.  Blood cultures x2 ordered. Patient was started on cefepime and vancomycin in the ED for right lower lobe pneumonia. TRH consulted for further evaluation and management of pneumonia and progressive weakness.  PT/OT evaluations completed with recommendations for inpatient rehab admission due to decline in function.   Patient's medical record from [facility] has  been reviewed by the rehabilitation admission coordinator and physician.  Past Medical History  Past Medical History:  Diagnosis Date   1st degree AV block 11/09/2018   Noted on EKG    Anemia    Aortic stenosis, severe    S/p Edwards Sapien 3 Transcatheter Heart Valve (size 26 mm, model # [id number], serial # [id number])   Arthritis    Back pain    Bursitis    Cataract    left immature   Cellulitis 10/12/2015   CHF (congestive heart  failure) (HCC)    Complication of anesthesia    Halucinations   Constipation    takes Miralax daily as well as Senokot daily   DDD (degenerative disc disease)    Gout    takes Allopurinol daily   Heart valve disorder    History of blood clots 1962   knee   History of blood transfusion    no abnormal reaction noted   History of shingles    Hyperlipidemia    takes Atorvastatin daily   Hypertension    takes Lisinopril daily   Low back pain 01/25/2017   Morbid obesity (HCC)    OSA (obstructive sleep apnea)    Osteoarthritis    Peripheral neuropathy    takes Gabapentin daily   Peroneal palsy    significant right foot drop   Persistent atrial fibrillation (HCC)    Pneumonia 25+yrs ago   hx of   Prostate cancer ([place]) 02/13/2019   RBBB 11/09/2018   Noted on EKG   Thrombocytopenia ([place])    Urinary frequency    Urinary urgency    Valvular heart disease    Venous stasis dermatitis     Family History   family history includes Arthritis in his sister and sister; Other in his father; Prostate cancer in his brother.  Prior Rehab/Hospitalizations Has the patient had prior rehab or hospitalizations prior to admission? No  Has the patient had major surgery during 100 days prior to admission? No   Current Medications  Current Facility-Administered Medications:    acetaminophen (TYLENOL) tablet 1,000 mg, 1,000 mg, Oral, Q6H PRN **OR** acetaminophen (TYLENOL) suppository 650 mg, 650 mg, Rectal, Q6H PRN, Reubin Milan, MD   albuterol (PROVENTIL) (2.5 MG/3ML) 0.083% nebulizer solution 2.5 mg, 2.5 mg, Nebulization, Q2H PRN, Reubin Milan, MD, 2.5 mg at 02/16/21 1552   allopurinol (ZYLOPRIM) tablet 100 mg, 100 mg, Oral, Daily, Reubin Milan, MD, 100 mg at 02/26/21 0910   apixaban (ELIQUIS) tablet 5 mg, 5 mg, Oral, BID, Reubin Milan, MD, 5 mg at 02/26/21 5462   ascorbic acid (VITAMIN C) tablet 500 mg, 500 mg, Oral, Daily, Reubin Milan, MD, 500 mg at 02/26/21  0910   atorvastatin (LIPITOR) tablet 80 mg, 80 mg, Oral, Daily, Reubin Milan, MD, 80 mg at 02/26/21 0910   famotidine (PEPCID) tablet 40 mg, 40 mg, Oral, Daily, Reubin Milan, MD, 40 mg at 02/26/21 0910   fluticasone furoate-vilanterol (BREO ELLIPTA) 100-25 MCG/INH 1 puff, 1 puff, Inhalation, Daily, 1 puff at 02/26/21 0812 **AND** umeclidinium bromide (INCRUSE ELLIPTA) 62.5 MCG/INH 1 puff, 1 puff, Inhalation, Daily, Reubin Milan, MD, 1 puff at 02/26/21 0813   gabapentin (NEURONTIN) capsule 300 mg, 300 mg, Oral, BID, Reubin Milan, MD, 300 mg at 02/26/21 0910   lidocaine (PF) (XYLOCAINE) 1 % injection, , , PRN, Bruning, Kevin, PA-C, 30 mL at 02/18/21 1017   polyethylene glycol (MIRALAX / GLYCOLAX) packet 17 g, 17 g, Oral,  Daily, Reubin Milan, MD, 17 g at 02/26/21 5003  Patients Current Diet:  Diet Order             Diet Heart Room service appropriate? Yes; Fluid consistency: Thin; Fluid restriction: 1500 mL Fluid  Diet effective now                   Precautions / Restrictions Precautions Precautions: Fall Precaution Comments: watch O2 Other Brace: Has bilateral knee braces/compression wrap (for stability); left knee hinged brace then places elastic sleeve over brace; rt knee prefers ace wrap tightly for support Restrictions Weight Bearing Restrictions: No   Has the patient had 2 or more falls or a fall with injury in the past year? No  Prior Activity Level Limited Community (1-2x/wk): Went out 3-4 days a week  Prior Functional Level Self Care: Did the patient need help bathing, dressing, using the toilet or eating? Needed some help  Indoor Mobility: Did the patient need assistance with walking from room to room (with or without device)? Independent  Stairs: Did the patient need assistance with internal or external stairs (with or without device)? Needed some help  Functional Cognition: Did the patient need help planning regular tasks such as  shopping or remembering to take medications? Independent  Home Assistive Devices / Equipment Home Assistive Devices/Equipment: Gilford Rile (specify type) Home Equipment: Transport planner, Environmental consultant - 2 wheels  Prior Device Use: Indicate devices/aids used by the patient prior to current illness, exacerbation or injury? Motorized wheelchair or scooter, Adult nurse, and Barista  Overall Cognitive Status: Within Functional Limits for tasks assessed Orientation Level: Oriented X4 General Comments: Verbose with conversation requiring redirection to task    Extremity Assessment (includes Sensation/Coordination)  Upper Extremity Assessment: Generalized weakness, LUE deficits/detail LUE Deficits / Details: limited ROM to 90 degrees flexion in LUE  Lower Extremity Assessment: Defer to PT evaluation    ADLs  Overall ADL's : Needs assistance/impaired Eating/Feeding: Sitting, Set up Eating/Feeding Details (indicate cue type and reason): Pt able to bring a drink to his mouth and take a few sips once placed in front of him Grooming: Bed level, Set up Upper Body Bathing: Minimal assistance, Bed level Lower Body Bathing: Bed level, Total assistance, +2 for physical assistance, +2 for safety/equipment Lower Body Bathing Details (indicate cue type and reason): Pt required +2-3 for rolling to assist with clean up in bed Upper Body Dressing : Minimal assistance, Bed level Lower Body Dressing: Moderate assistance, Sitting/lateral leans Lower Body Dressing Details (indicate cue type and reason): Pt donning shoes and knee braces, needs assist with back of shoes and tying knots and with knee braces pt needs assist getting them pulled up to his calves then he can pull them up and secure them the rest of the way, Toileting- Clothing Manipulation and Hygiene: +2 for physical assistance, Bed level, +2 for safety/equipment, Total assistance Functional mobility during ADLs: Maximal  assistance, +2 for physical assistance, +2 for safety/equipment General ADL Comments: session focus on bed level therex, lateral reaching to faciliate improved bed mobility and BUE therex    Mobility  Overal bed mobility: Needs Assistance Bed Mobility: Supine to Sit, Sit to Supine Rolling: Min guard Supine to sit: Min assist, HOB elevated Sit to supine: Max assist, +2 for physical assistance General bed mobility comments: pt able to get legs over EOB, then with HOB 45 degrees, pt pushed up onto left elbow and pulled against PT with RUE to raise torso--partially  simulating how he pushes up from supine on his firm, king bed at home; return to supine requires 1 person assist to each leg and pivot on his back, then pull up to [facility] with bed in trendelenburg    Transfers  Overall transfer level: Needs assistance Equipment used: Bilateral platform walker Transfers: Sit to/from Stand Sit to Stand: From elevated surface, Min assist, +2 safety/equipment General transfer comment: with bil knees braced/wrapped, shoes, and bed elevated pt able to stand x3 (1x 90 seconds; 1x 60 seconds; 1x 45 seconds). As knees fatigue, see increased ?asterixis ?myoclonus/bil knee buckling and then catching    Ambulation / Gait / Stairs / Wheelchair Mobility  Ambulation/Gait General Gait Details: unable this session; pt can take full weight on bil UEs and LLE to allow him to move/advance RLE, however can't do the opposite leg    Posture / Balance Dynamic Sitting Balance Sitting balance - Comments: Requires assist with initial sitting. Posterior leaning. Able to correct when cued and bed elevated to make room for panus/abdomen Balance Overall balance assessment: Needs assistance Sitting-balance support: Feet supported (bed elevated) Sitting balance-Leahy Scale: Fair Sitting balance - Comments: Requires assist with initial sitting. Posterior leaning. Able to correct when cued and bed elevated to make room for  panus/abdomen Postural control: Posterior lean Standing balance-Leahy Scale: Poor Standing balance comment: Heavy reliance on BUE support as well as right leg locked in extension    Special needs/care consideration BiPAP - has a Trilogy 100 at the bedside for nighttime use, Oxygen On 02 3L by trilogy at night and 2 1/2 L during the day [place], and Skin Venous stasis ulcers lower legs; Requires bariatric equipment    Previous Home Environment (from acute therapy documentation) Living Arrangements: Spouse/significant other, Children Available Help at Discharge: Family, Available 24 hours/day Type of Home: House Home Layout: One level Home Access: Ramped entrance Bathroom Shower/Tub: Other (comment) (sponge bathes) Bathroom Toilet: Handicapped height Bathroom Accessibility: Yes [place]: No Additional Comments: has a lift chair  Discharge Living Setting Plans for Discharge Living Setting: Patient's home, House, Lives with (comment) (Lives with wife and 47 yo son) Type of Home at Discharge: House Discharge Home Layout: Two level Alternate Level Stairs-Number of Steps: Flight Discharge Home Access: [place] entrance (Has a lift at side garage entrance) Discharge Bathroom Shower/Tub: Walk-in shower, Door (Has not used tub or shower in 4 years.) Discharge Bathroom Toilet: Handicapped height Discharge Bathroom Accessibility: Yes How Accessible: Accessible via walker Does the patient have any problems obtaining your medications?: No  Social/Family/Support Systems Patient Roles: Spouse, Parent, Other (Comment) (Has wife, sons, granddaughter and friends.) Contact Information: Luken Shadowens - wife - [phone] Anticipated Caregiver: Wife, sons, granddaughter Ability/Limitations of Caregiver: One son works days, another son works from home, granddaughter available this summer, wife with early dementia. Caregiver Availability: 24/7 Discharge Plan Discussed with Primary Caregiver: Yes Is  Caregiver In Agreement with Plan?: Yes Does Caregiver/Family have Issues with Lodging/Transportation while Pt is in Rehab?: No  Goals Patient/Family Goal for Rehab: PT/OT supervision to min assist goals Expected length of stay: 10-14 days Cultural Considerations: None Pt/Family Agrees to Admission and willing to participate: Yes Program Orientation Provided & Reviewed with Pt/Caregiver Including Roles  & Responsibilities: Yes  Decrease burden of Care through IP rehab admission: N/A  Possible need for SNF placement upon discharge: Not planned  Patient Condition: I have reviewed medical records from [facility], spoken with CM, and patient, spouse, and family member. I met with patient  at the bedside for inpatient rehabilitation assessment.  Patient will benefit from ongoing PT and OT, can actively participate in 3 hours of therapy a day 5 days of the week, and can make measurable gains during the admission.  Patient will also benefit from the coordinated team approach during an Inpatient Acute Rehabilitation admission.  The patient will receive intensive therapy as well as Rehabilitation physician, nursing, social worker, and care management interventions.  Due to bladder management, bowel management, safety, skin/wound care, disease management, medication administration, pain management, and patient education the patient requires 24 hour a day rehabilitation nursing.  The patient is currently min +2 to max +2 with mobility and basic ADLs.  Discharge setting and therapy post discharge at home with home health is anticipated.  Patient has agreed to participate in the Acute Inpatient Rehabilitation Program and will admit once medically ready and bed available.  Preadmission Screen Completed By:  Retta Diones, 02/26/2021 1:55 PM ______________________________________________________________________   Discussed status with Dr. Ranell Patrick  on 03/03/21 at 105 and received approval for admission  today.  Admission Coordinator:  Retta Diones, RN, time 1200 Sudie Grumbling 03/03/21  Assessment/Plan: Diagnosis: Debility Does the need for close, 24 hr/day Medical supervision in concert with the patient's rehab needs make it unreasonable for this patient to be served in a less intensive setting? Yes Co-Morbidities requiring supervision/potential complications: acute respiratory failure with hypoxia, hypercholesterolemia, obesity class 3, macrocytic anemia, OSA, COPD, Stage 3 CKD Due to bladder management, bowel management, safety, skin/wound care, disease management, medication administration, pain management, and patient education, does the patient require 24 hr/day rehab nursing? Yes Does the patient require coordinated care of a physician, rehab nurse, PT, OT to address physical and functional deficits in the context of the above medical diagnosis(es)? Yes Addressing deficits in the following areas: balance, endurance, locomotion, strength, transferring, bowel/bladder control, bathing, dressing, feeding, grooming, toileting, and psychosocial support Can the patient actively participate in an intensive therapy program of at least 3 hrs of therapy 5 days a week? Yes The potential for patient to make measurable gains while on inpatient rehab is good Anticipated functional outcomes upon discharge from inpatient rehab: min assist PT, min assist OT, independent SLP Estimated rehab length of stay to reach the above functional goals is: 2-3 weeks Anticipated discharge destination: Home 10. Overall Rehab/Functional Prognosis: excellent   MD Signature: Leeroy Cha, MD

## 2021-02-26 NOTE — Progress Notes (Signed)
Occupational Therapy Treatment Patient Details Name: [name] MRN: [id number] DOB: 1940/10/08 Today's Date: 02/26/2021    History of present illness Pt is a 80 y.o. male recently admitted to [facility] (02/12/21-02/14/21) with acute on chronic respiratory failure, now readmitted 02/14/21 due to weakness and SOB, unable to stand up from toilet requiring EMS call. Found to be hypoxic. Workup for acute on chronic hypoxic respiratory failure, possibly combination of acute on chronic dHF and PNA. PMH includes OSA, HTN, obesity, afib, CHF, venous stasis dermatitis.   OT comments  Pt making steady progress towards OT goals this session. Session focus on precursors to bed mobility including working on segmental rolling,lateral reaching with BUEs to bed rails as well as BUE therex with level 1 theraband to increase strength and AROM for higher level functional mobility tasks. Pt very motivated to return to PLOF with pt reporting his biggest goal is to be able to walk with his platform RW. Pt on 3L upon arrival, lowered O2 to 2.5 L with SpO2 sustaining >90% during session, notifed RN. Pt would continue to benefit from skilled occupational therapy while admitted and after d/c to address the below listed limitations in order to improve overall functional mobility and facilitate independence with BADL participation. DC plan remains appropriate, will follow acutely per POC.     Follow Up Recommendations  CIR    Equipment Recommendations  None recommended by OT    Recommendations for Other Services      Precautions / Restrictions Precautions Precautions: Fall Precaution Comments: watch O2 Required Braces or Orthoses: Other Brace Other Brace: Has bilateral knee braces/compression wrap (for stability); left knee hinged brace then places elastic sleeve over brace; rt knee prefers ace wrap tightly for support Restrictions Weight Bearing Restrictions: No       Mobility Bed Mobility                General bed mobility comments: worked on lateral reaching with BUEs to bed rail, added in bending up BLEs when reaching to bed rail with pt needing MOD A to full maneuver BLEs into sidelying ( RLE<LLE), additionally worked on shifting trunk anteriorly with BUEs on bed rails with pt able to complete with no more than min guard assist x10 reps    Transfers                 General transfer comment: Deferred transfers this session, focused on bed mobility and strengthening    Balance                                           ADL either performed or assessed with clinical judgement   ADL                                         General ADL Comments: session focus on bed level therex, lateral reaching to faciliate improved bed mobility and BUE therex     Vision       Perception     Praxis      Cognition Arousal/Alertness: Awake/alert Behavior During Therapy: WFL for tasks assessed/performed Overall Cognitive Status: Within Functional Limits for tasks assessed  General Comments: Verbose with conversation requiring redirection to task        Exercises General Exercises - Upper Extremity Shoulder Flexion: Strengthening;Both;5 reps;Seated;Theraband;Limitations Theraband Level (Shoulder Flexion): Level 1 (Yellow) Shoulder Flexion Limitations: limited to ~ 90* on L shoulder Shoulder Extension: Strengthening;Both;5 reps;Seated;Theraband Theraband Level (Shoulder Extension): Level 1 (Yellow) Elbow Flexion: Strengthening;5 reps;Seated;Theraband Theraband Level (Elbow Flexion): Level 1 (Yellow) Elbow Extension: Strengthening;5 reps;Seated;Theraband Theraband Level (Elbow Extension): Level 1 (Yellow) Other Exercises Other Exercises: punches with BUEs with level 1 theraband seated in upright position bed Other Exercises: segmental rolling bothing sides with pt reaching to bed rails with BUEs,  needing MOD A to maneuver BLEs fully to EOB   Shoulder Instructions       General Comments pt on 3L upon arrival, lowed O2 to 2.5 L with SpO2 sustaining >90% during session, notifed RN    Pertinent Vitals/ Pain       Pain Assessment: Faces Faces Pain Scale: Hurts a little bit Pain Location: L shoulder with therex Pain Descriptors / Indicators: Discomfort;Grimacing Pain Intervention(s): Monitored during session;Repositioned;Limited activity within patient's tolerance;Other (comment) (encouraged pt to decrease AROM with L shoulder during therex)  Home Living                                          Prior Functioning/Environment              Frequency  Min 5X/week        Progress Toward Goals  OT Goals(current goals can now be found in the care plan section)  Progress towards OT goals: Progressing toward goals  Acute Rehab OT Goals Patient Stated Goal: to walk OT Goal Formulation: With patient/family Time For Goal Achievement: 03/03/21 Potential to Achieve Goals: Good  Plan Discharge plan remains appropriate;Frequency remains appropriate    Co-evaluation                 AM-PAC OT "6 Clicks" Daily Activity     Outcome Measure   Help from another person eating meals?: None Help from another person taking care of personal grooming?: A Little Help from another person toileting, which includes using toliet, bedpan, or urinal?: Total Help from another person bathing (including washing, rinsing, drying)?: A Lot Help from another person to put on and taking off regular upper body clothing?: A Lot Help from another person to put on and taking off regular lower body clothing?: Total 6 Click Score: 13    End of Session Equipment Utilized During Treatment: Oxygen;Other (comment) (3-2.6 L [place])  OT Visit Diagnosis: Muscle weakness (generalized) (M62.81);Other abnormalities of gait and mobility (R26.89);Unsteadiness on feet (R26.81)   Activity  Tolerance Patient tolerated treatment well   Patient Left in bed;with call bell/phone within reach;with bed alarm set;with family/visitor present   Nurse Communication Mobility status;Other (comment) (lowered O2 to 2.5 L)        Time: 3300-7622 OT Time Calculation (min): 36 min  Charges: OT General Charges $OT Visit: 1 Visit OT Treatments $Therapeutic Activity: 23-37 mins  Harley Alto., COTA/L Acute Rehabilitation Services [phone]    Precious Haws 02/26/2021, 10:58 AM

## 2021-02-26 NOTE — Progress Notes (Signed)
Physical Therapy Treatment Patient Details Name: [name] MRN: [id number] DOB: [birth year] Today's Date: 02/26/2021    History of Present Illness Pt is a 80 y.o. male recently admitted to [facility] (02/12/21-02/14/21) with acute on chronic respiratory failure, now readmitted 02/14/21 due to weakness and SOB, unable to stand up from toilet requiring EMS call. Found to be hypoxic. Workup for acute on chronic hypoxic respiratory failure, possibly combination of acute on chronic dHF and PNA. PMH includes OSA, HTN, obesity, afib, CHF, venous stasis dermatitis.    PT Comments    Patient remains highly motivated to regain his mobility and return home with his wife and son. He tolerated more activity compared to 6/8, including exercises, bed mobility and standing. He was able to move to sit EOB with 1 person assist with HOB elevated 40 degrees. Stood x 3 with max time 90 seconds with min assist of 2 (second person for safety with one person physically assisting). See below for pre-gait activities.   After session discussed with unit director ?need for verticalization bed/therapy (noted order was initiated yesterday by nursing). At this time if nursing can do verticalization, I think this bed could be of benefit not only for strengthening his legs but to give increased room for him to complete bed mobility as he usually does on his king-size bed at home.    Follow Up Recommendations  CIR     Equipment Recommendations  None recommended by PT    Recommendations for Other Services       Precautions / Restrictions Precautions Precautions: Fall Precaution Comments: watch O2 Required Braces or Orthoses: Other Brace Other Brace: Has bilateral knee braces/compression wrap (for stability); left knee hinged brace then places elastic sleeve over brace; rt knee prefers ace wrap tightly for support Restrictions Weight Bearing Restrictions: No    Mobility  Bed Mobility Overal bed mobility: Needs  Assistance Bed Mobility: Supine to Sit;Sit to Supine     Supine to sit: Min assist;HOB elevated Sit to supine: Max assist;+2 for physical assistance   General bed mobility comments: pt able to get legs over EOB, then with HOB 45 degrees, pt pushed up onto left elbow and pulled against PT with RUE to raise torso--partially simulating how he pushes up from supine on his firm, king bed at home; return to supine requires 1 person assist to each leg and pivot on his back, then pull up to [facility] with bed in trendelenburg    Transfers Overall transfer level: Needs assistance Equipment used: Bilateral platform walker Transfers: Sit to/from Stand Sit to Stand: From elevated surface;Min assist;+2 safety/equipment         General transfer comment: with bil knees braced/wrapped, shoes, and bed elevated pt able to stand x3 (1x 90 seconds; 1x 60 seconds; 1x 45 seconds). As knees fatigue, see increased ?asterixis ?myoclonus/bil knee buckling and then catching  Ambulation/Gait             General Gait Details: unable this session; pt can take full weight on bil UEs and LLE to allow him to move/advance RLE, however can't do the opposite leg   Stairs             Wheelchair Mobility    Modified Rankin (Stroke Patients Only)       Balance Overall balance assessment: Needs assistance Sitting-balance support: Feet supported (bed elevated) Sitting balance-Leahy Scale: Fair Sitting balance - Comments: Requires assist with initial sitting. Posterior leaning. Able to correct when cued and bed elevated to  make room for panus/abdomen     Standing balance-Leahy Scale: Poor Standing balance comment: Heavy reliance on BUE support as well as right leg locked in extension                            Cognition Arousal/Alertness: Awake/alert Behavior During Therapy: WFL for tasks assessed/performed Overall Cognitive Status: Within Functional Limits for tasks assessed                                  General Comments: Verbose with conversation requiring redirection to task      Exercises Total Joint Exercises Ankle Circles/Pumps: AROM;Both;10 reps (very limited bil ankle ROM; pt states is his baseline) Heel Slides: AAROM;Both;10 reps (AAROM to incr ROM) Hip ABduction/ADduction: AROM;Both;5 reps Straight Leg Raises:  (pt reports he did earlier on his own) General Exercises - Upper Extremity Shoulder Flexion: Strengthening;Both;5 reps;Seated;Theraband;Limitations Theraband Level (Shoulder Flexion): Level 1 (Yellow) Shoulder Flexion Limitations: limited to ~ 90* on L shoulder Shoulder Extension: Strengthening;Both;5 reps;Seated;Theraband Theraband Level (Shoulder Extension): Level 1 (Yellow) Elbow Flexion: Strengthening;5 reps;Seated;Theraband Theraband Level (Elbow Flexion): Level 1 (Yellow) Elbow Extension: Strengthening;5 reps;Seated;Theraband Theraband Level (Elbow Extension): Level 1 (Yellow) General Exercises - Lower Extremity Long Arc Quad: AROM;Both;5 reps Other Exercises Other Exercises: with HOB elevated, pushing bil elbows into mattress and try to raise torso (as he does to get OOB at home) Other Exercises: segmental rolling bothing sides with pt reaching to bed rails with BUEs, needing MOD A to maneuver BLEs fully to EOB    General Comments General comments (skin integrity, edema, etc.): pt on 3L upon arrival, lowed O2 to 2.5 L with SpO2 sustaining >90% during session, notifed RN      Pertinent Vitals/Pain Pain Assessment: Faces Faces Pain Scale: Hurts a little bit Pain Location: generalized with bed mobility Pain Descriptors / Indicators: Discomfort Pain Intervention(s): Limited activity within patient's tolerance;Monitored during session;Repositioned    Home Living                      Prior Function            PT Goals (current goals can now be found in the care plan section) Acute Rehab PT Goals Patient Stated Goal:  to get to rehab Time For Goal Achievement: 03/01/21 Potential to Achieve Goals: Good Progress towards PT goals: Progressing toward goals    Frequency    Min 3X/week      PT Plan Frequency needs to be updated;Current plan remains appropriate    Co-evaluation              AM-PAC PT "6 Clicks" Mobility   Outcome Measure  Help needed turning from your back to your side while in a flat bed without using bedrails?: Total Help needed moving from lying on your back to sitting on the side of a flat bed without using bedrails?: Total Help needed moving to and from a bed to a chair (including a wheelchair)?: Total Help needed standing up from a chair using your arms (e.g., wheelchair or bedside chair)?: Total Help needed to walk in hospital room?: Total Help needed climbing 3-5 steps with a railing? : Total 6 Click Score: 6    End of Session Equipment Utilized During Treatment: Oxygen Activity Tolerance: Patient tolerated treatment well Patient left: in bed;with call bell/phone within reach;with bed alarm set;with family/visitor present  PT Visit Diagnosis: Other abnormalities of gait and mobility (R26.89)     Time: 2998-0699 PT Time Calculation (min) (ACUTE ONLY): 55 min  Charges:  $Gait Training: 8-22 mins $Therapeutic Exercise: 23-37 mins $Therapeutic Activity: 8-22 mins                      Arby Barrette, PT Pager [phone]    Rexanne Mano 02/26/2021, 12:33 PM

## 2021-02-27 ENCOUNTER — Other Ambulatory Visit: Payer: Self-pay | Admitting: Family Medicine

## 2021-02-27 DIAGNOSIS — J9601 Acute respiratory failure with hypoxia: Secondary | ICD-10-CM | POA: Diagnosis not present

## 2021-02-27 NOTE — Progress Notes (Signed)
Physical Therapy Treatment Patient Details Name: [name] MRN: [id number] DOB: 04-02-1941 Today's Date: 02/27/2021    History of Present Illness Pt is a 80 y.o. male recently admitted to [facility] (02/12/21-02/14/21) with acute on chronic respiratory failure, now readmitted 02/14/21 due to weakness and SOB, unable to stand up from toilet requiring EMS call. Found to be hypoxic. Workup for acute on chronic hypoxic respiratory failure, likely combination of acute on chronic dHF and PNA. Chest CT with L pleural effusions; s/p thoracentesis. 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 PMH includes OSA, HTN, obesity, afib, CHF, venous stasis dermatitis.   PT Comments    Pt progressing with mobility; notes BLE from this morning's session with OT. Today's session focused on seated EOB activity, standing trials and therex. Pt tolerated prolonged bout of sitting activity as well as brief period of standing; unable to take steps this session. Pt motivated to participate despite fatigue; family present and supportive. Pt remains limited by generalized weakness, decreased activity tolerance, impaired balance strategies. Continue to recommend intensive CIR-level therapies to maximize functional mobility and independence prior to return home.   Follow Up Recommendations  CIR     Equipment Recommendations   (TBD)    Recommendations for Other Services       Precautions / Restrictions Precautions Precautions: Fall;Other (comment) Precaution Comments: Watch SpO2 Other Brace: Has bilateral knee braces/compression wrap (for stability); left knee hinged brace then places elastic sleeve over brace; rt knee prefers ace wrap tightly for support Restrictions Weight Bearing Restrictions: No    Mobility  Bed Mobility Overal bed mobility: Needs Assistance Bed Mobility: Supine to Sit;Sit to Supine     Supine to sit: Mod assist;HOB elevated Sit to  supine: Max assist;+2 for physical assistance   General bed mobility comments: Pt able to maneuver BLEs to EOB with modA for HHA to elevate trunk. Return to supine with maxA+2 for BLE management and trunk support to pivot on his back with bed pads; maxA+2 to pull up with bed in trendelenberg, pt able to assist with RUE pulling on top rail    Transfers Overall transfer level: Needs assistance Equipment used: Bilateral platform walker Transfers: Sit to/from Stand Sit to Stand: Min assist;+2 physical assistance;From elevated surface         General transfer comment: Bilateral knee braces/wraps and shoes donned prior to standing; bed elevated per pt request to simulate bed height at home; pt particular with walker set-up and standing technique, minA+2 for stability and safety, heavy reliance on BUE support on platform walker to push up; no noted asterixis(?) or knee buckling this session; pt keeping BLEs braced against EOB and unable to fully extend hips upright to get legs away from bed  Ambulation/Gait                 Stairs             Wheelchair Mobility    Modified Rankin (Stroke Patients Only)       Balance Overall balance assessment: Needs assistance Sitting-balance support: Feet supported Sitting balance-Leahy Scale: Fair Sitting balance - Comments: Able to more quickly achieve static sitting balance, scooting hips to EOB well with UE support pulling on edge of mattress; requires assist to don bilaterl knee braces and shoes     Standing balance-Leahy Scale: Poor Standing balance comment: Heavy reliance on BUE support as well as BLEs locked in extension  Cognition Arousal/Alertness: Awake/alert Behavior During Therapy: WFL for tasks assessed/performed Overall Cognitive Status: Within Functional Limits for tasks assessed                                 General Comments: Verbose with conversation requiring  redirection to task      Exercises Other Exercises Other Exercises: Reviewed pt's therex (pt able to recall bicep curls with theraband on bed rails, pulling on bedrails for anterior weight translation/core engagement, hip ABD/ADD, partial SLR); reviewed these and provided [place] HEP handout (Access Code [email or web address]), added quad sets    General Comments General comments (skin integrity, edema, etc.): Pt's son and wife present, supportive. VSS on 2L O2 [place]      Pertinent Vitals/Pain Pain Assessment: No/denies pain Pain Intervention(s): Monitored during session    Home Living                      Prior Function            PT Goals (current goals can now be found in the care plan section) Acute Rehab PT Goals Patient Stated Goal: to get to rehab PT Goal Formulation: With patient Time For Goal Achievement: 03/13/21 Potential to Achieve Goals: Good Progress towards PT goals: Progressing toward goals    Frequency    Min 5X/week      PT Plan Frequency needs to be updated    Co-evaluation              AM-PAC PT "6 Clicks" Mobility   Outcome Measure  Help needed turning from your back to your side while in a flat bed without using bedrails?: Total Help needed moving from lying on your back to sitting on the side of a flat bed without using bedrails?: Total Help needed moving to and from a bed to a chair (including a wheelchair)?: Total Help needed standing up from a chair using your arms (e.g., wheelchair or bedside chair)?: Total Help needed to walk in hospital room?: Total Help needed climbing 3-5 steps with a railing? : Total 6 Click Score: 6    End of Session Equipment Utilized During Treatment: Gait belt;Oxygen Activity Tolerance: Patient tolerated treatment well Patient left: in bed;with call bell/phone within reach;with bed alarm set;with family/visitor present Nurse Communication: Mobility status PT Visit Diagnosis: Other abnormalities of gait and  mobility (R26.89)     Time: 1355-1435 PT Time Calculation (min) (ACUTE ONLY): 40 min  Charges:  $Therapeutic Exercise: 8-22 mins $Therapeutic Activity: 23-37 mins                    Mabeline Caras, PT, DPT Acute Rehabilitation Services  Pager [phone] Office [place] 02/27/2021, 5:58 PM

## 2021-02-27 NOTE — Progress Notes (Signed)
PROGRESS NOTE    [name]  YYT:[id number] DOB: 1941-07-06 DOA: 02/14/2021 PCP: [name], MD    Brief Narrative:  [name] is a 80 year old male with past medical history significant for paroxysmal atrial fibrillation, aortic stenosis s/p TAVR, chronic diastolic congestive heart failure, degenerative disc disease, gout, hyperlipidemia, hypertension, morbid obesity, OSA/hypoventilation syndrome, OA, CKD3a, peripheral neuropathy, CAD, history of prostate cancer, venous stasis dermatitis who presented to [facility] ED on 5/28 via EMS for weakness and dyspnea.  Patient reports was unable to get off the toilet with associated shortness of breath and generalized weakness.  Patient denies headache, no fever/chills, no nausea/vomiting/diarrhea, no palpitations, no diaphoresis, no abdominal pain.   Patient recently hospitalized at [facility] long hospital from  5/26 through 5/28 for decompensated OSA/OHS.  During that hospitalization, his BiPAP settings were optimized with a new facemask.  Patient also reports during the hospitalization he had very little mobility which he believes caused deconditioning when he returned home.   In the ED, temperature 99.2 F, HR 89, RR 29, BP 92/52, SPO2 97% on 3 L nasal cannula. Sodium 137, potassium 3.9, chloride 94, CO2 36, glucose 127, BUN 32, creatinine 1.67. WBC 10.6 (was 6.6 on 5/28 at 0127am), hemoglobin 12.2, platelets 131.  COVID-19 PCR negative.  Influenza A/B PCR negative.  Urinalysis with large leukocytes, negative nitrite, rare bacteria, greater than 50 WBCs.  VBG with pH 7.49, PaCO2 47.5, PaO2 56.  Chest x-ray with slight increase in right basilar airspace opacity compared to prior.  Blood cultures x2 ordered. Patient was started on cefepime and vancomycin in the ED for right lower lobe pneumonia. TRH consulted for further evaluation and management of pneumonia and progressive weakness.    Assessment & Plan:   Principal Problem:   Acute  respiratory failure with hypoxia (HCC) Active Problems:   HYPERCHOLESTEROLEMIA   Obesity, Class III, BMI 40-49.9 (morbid obesity) (HCC)   Macrocytic anemia   Obstructive sleep apnea   COPD mixed type (HCC)   Essential hypertension   CKD stage 3 secondary to diabetes (HCC)   Obesity hypoventilation syndrome (HCC)   Acute on chronic respiratory failure with hypoxemia (HCC)   Cervical stenosis of spine   Spinal cord myoclonus   Acute on chronic hypoxic respiratory failure, POA Community-acquired pneumonia Patient presenting to [facility] ED with complaints of productive cough with yellow sputum.  Chest x-ray shows progressive right basilar airspace opacity.  WBC elevated 10.6, up from 6.6 on discharge earlier in the day.  Lactic acid 1.4, procalcitonin elevated 0.44.  Patient was initially started on IV vancomycin, MRSA PCR was negative and was subsequently discontinued.  Patient completed 7-day course of IV cefepime. --Continue supplemental oxygen, maintain SPO2 greater than 88%, currently on 3L [place] (baseline requirement is 3L qHS w/ CPAP) but will likely need continuous oxygen on discharge.  Pleural effusion CT chest without contrast with moderate right, small left pleural effusions with associated L ectasis and consolidation.  IR was consulted and patient underwent thoracentesis with 700 mL of serosanguineous fluid removed.  Pleural fluid cultures negative.  Cytology negative for malignant cells.   Acute renal failure on CKD stage IIIa Baseline creatinine 1.1-1.2.  On discharge on 02/14/2021 was 1.17 and presented with a creatinine of 1.65.  Etiology likely secondary to prerenal azotemia in the setting of furosemide use versus ATN with borderline hypotension. --Cr 1.67>>1.37>>1.68>1.92>1.73>1.46 --Continue to hold home furosemide --Avoid nephrotoxins, renally dose all medications --BMP daily  Hypokalemia: Resolved Repleted --Repeat electrolytes in  a.m. to include magnesium   COPD,  not in acute exacerbation Continue home Trelegy Ellipta 1 puff daily --Continue supplemental oxygen as above   Obstructive sleep apnea Obesity hypoventilation syndrome Recently discharged by pulmonary critical care medicine on 5/28 with adjustments in his home Bipap settings.  Also fitted with a new mask. --Continue nocturnal Bipap with 3 L O2    Chronic diastolic congestive heart failure, compensated On furosemide 20 mg p.o. twice daily at home.  Borderline hypotensive and notable for renal insufficiency on admission.  Chest x-ray with no pulmonary edema. --continue to hold furosemide with increased creatinine --Strict I's and O's Daily weights   Dyslipidemia: Continue atorvastatin 80 mg p.o. daily   Peripheral neuropathy --Gabapentin 300 mg p.o. twice daily   History of gout: Allopurinol 100 mg p.o. daily   Morbid obesity Body mass index is 48.97 kg/m.  Discussed with patient needs for aggressive lifestyle changes/weight loss as this complicates all facets of care.  Outpatient follow-up with PCP.  May benefit from bariatric evaluation outpatient.   Weakness/deconditioning/debility: Patient reports significant deconditioning since recent hospitalization.  Utilizes a walker at baseline.  Social work unable to find a suitable SNF for placement.  Plan for aggressive therapy while inpatient with plans to return home with home health vs possible CIR admission.  Awaiting insurance authorization.  Physiologic tremor likely secondary to spinal myoclonus CT head with partially calcified meningioma posterior frontal falx measuring 2.6 x 2.3 x 2.4 cm, mass-effect upon medial frontal lobe with mild brain edema, not significantly changed from last year.  No acute stroke.  Patient unable to tolerate MR given his morbid obesity and unable to lay flat due to his body habitus.  EEG with mild diffuse encephalopathy of nonspecific etiology, no seizures or epileptiform discharges noted.  Seen by neurology  with patient known history of segmental spinal stenosis and cervical spine disease and this is likely related to spinal myoclonus; but given no recent acute exacerbation of chronic neck pain and patient unable to tolerate MRI due to pain and dyspnea while lying flat, recommended outpatient follow-up with neurology for arrangement of open MRI.   DVT prophylaxis:  apixaban (ELIQUIS) tablet 5 mg    Code Status: Full Code Family Communication: No family present at bedside this morning  Disposition Plan:  Level of care: Telemetry Medical Status is: Inpatient  Remains inpatient appropriate because:Unsafe d/c plan   Dispo:  Patient From: Home  Planned Disposition: Home with Health Care Svc  Medically stable for discharge: No      Consultants:  Neurology Interventional radiology  Procedures:  Right thoracentesis, IR 02/18/2021  Antimicrobials:  Vancomycin 5/29 - 5/29 Cefepime 5/29 - 6/4    Subjective: Patient seen examined bedside, resting comfortably.  Continues to work more extensively with therapy.  Awaiting insurance authorization for CIR.  No other specific complaints or concerns at this time.  Patient denies headache, no visual changes, no chest pain, no palpitations, no shortness of breath more than his typical baseline, no abdominal pain, no weakness, no fatigue, no paresthesias.  No acute events overnight per nursing staff.  Objective: Vitals:   02/26/21 0814 02/26/21 1708 02/26/21 1946 02/27/21 0415  BP: (!) 143/60 138/60 (!) 131/59 (!) 131/58  Pulse: 69 69 66 (!) 58  Resp: 17 18 20 18   Temp: 98.1 F (36.7 C) 98.1 F (36.7 C) 98 F (36.7 C) 98 F (36.7 C)  TempSrc: Oral Oral Oral Oral  SpO2: 92%  94% 91%  Weight:  Height:        Intake/Output Summary (Last 24 hours) at 02/27/2021 1219 Last data filed at 02/27/2021 1215 Gross per 24 hour  Intake --  Output 400 ml  Net -400 ml   Filed Weights   02/21/21 0536 02/24/21 0431 02/25/21 0444  Weight: (!)  174.7 kg (!) 173.8 kg (!) 171.8 kg    Examination:  General exam: Appears calm and comfortable, obese Respiratory system: Breath sounds slightly decreased bilateral bases, no wheezes/crackles, normal respiratory effort on 3 L nasal cannula Cardiovascular system: S1 & S2 heard, RRR. No JVD, murmurs, rubs, gallops or clicks.  2+ pitting edema bilateral lower extremities to mid shin. Gastrointestinal system: Abdomen is nondistended, soft and nontender. No organomegaly or masses felt. Normal bowel sounds heard. Central nervous system: Alert and oriented. No focal neurological deficits. Extremities: Symmetric 5 x 5 power. Skin: Chronic venous changes bilateral lower extremities with cracking of skin, otherwise no other concerning rashes, lesions or ulcers Psychiatry: Judgement and insight appear normal. Mood & affect appropriate.       Data Reviewed: I have personally reviewed following labs and imaging studies  CBC: Recent Labs  Lab 02/22/21 0221  WBC 6.0  NEUTROABS 4.3  HGB 10.9*  HCT 34.3*  MCV 103.9*  PLT 694   Basic Metabolic Panel: Recent Labs  Lab 02/22/21 1639 02/23/21 0752 02/24/21 0321 02/25/21 0305 02/26/21 0323  NA 141 140 139 141 139  K 3.9 3.9 3.7 3.4* 3.6  CL 99 99 97* 96* 97*  CO2 34* 34* 35* 37* 35*  GLUCOSE 115* 95 101* 96 95  BUN 34* 34* 37* 39* 33*  CREATININE 1.59* 1.68* 1.92* 1.73* 1.46*  CALCIUM 9.0 9.0 8.6* 9.1 9.2  MG 2.1  --   --   --  2.1  PHOS 4.0  --   --   --   --    GFR: Estimated Creatinine Clearance: 69.6 mL/min (A) (by C-G formula based on SCr of 1.46 mg/dL (H)). Liver Function Tests: No results for input(s): AST, ALT, ALKPHOS, BILITOT, PROT, ALBUMIN in the last 168 hours. No results for input(s): LIPASE, AMYLASE in the last 168 hours. No results for input(s): AMMONIA in the last 168 hours. Coagulation Profile: No results for input(s): INR, PROTIME in the last 168 hours. Cardiac Enzymes: No results for input(s): CKTOTAL, CKMB,  CKMBINDEX, TROPONINI in the last 168 hours. BNP (last 3 results) No results for input(s): PROBNP in the last 8760 hours. HbA1C: No results for input(s): HGBA1C in the last 72 hours. CBG: Recent Labs  Lab 02/22/21 1611 02/24/21 2120 02/25/21 2128  GLUCAP 116* 129* 123*   Lipid Profile: No results for input(s): CHOL, HDL, LDLCALC, TRIG, CHOLHDL, LDLDIRECT in the last 72 hours. Thyroid Function Tests: No results for input(s): TSH, T4TOTAL, FREET4, T3FREE, THYROIDAB in the last 72 hours. Anemia Panel: No results for input(s): VITAMINB12, FOLATE, FERRITIN, TIBC, IRON, RETICCTPCT in the last 72 hours. Sepsis Labs: No results for input(s): PROCALCITON, LATICACIDVEN in the last 168 hours.  Recent Results (from the past 240 hour(s))  Body fluid culture w Gram Stain     Status: None   Collection Time: 02/18/21 10:34 AM   Specimen: Lung, Right; Pleural Fluid  Result Value Ref Range Status   Specimen Description PLEURAL FLUID  Final   Special Requests LUNG RIGHT  Final   Gram Stain   Final    ABUNDANT WBC PRESENT,BOTH PMN AND MONONUCLEAR NO ORGANISMS SEEN    Culture   Final  NO GROWTH 3 DAYS Performed at [facility], [address] [place], [place] [address]    Report Status 02/21/2021 FINAL  Final         Radiology Studies: No results found.       Scheduled Meds:  allopurinol  100 mg Oral Daily   apixaban  5 mg Oral BID   vitamin C  500 mg Oral Daily   atorvastatin  80 mg Oral Daily   famotidine  40 mg Oral Daily   fluticasone furoate-vilanterol  1 puff Inhalation Daily   And   umeclidinium bromide  1 puff Inhalation Daily   gabapentin  300 mg Oral BID   polyethylene glycol  17 g Oral Daily   Continuous Infusions:   LOS: 12 days    Time spent: 35 minutes spent on chart review, discussion with nursing staff, consultants, updating family and interview/physical exam; more than 50% of that time was spent in counseling and/or coordination of  care.    Flornce Record J [place] DO [facility] Available via Epic secure chat 7am-7pm After these hours, please refer to coverage provider listed on [email or web address] 02/27/2021, 12:19 PM

## 2021-02-27 NOTE — Progress Notes (Signed)
Occupational Therapy Evaluation Patient Details Name: [name] MRN: [id number] DOB: 12-15-1940 Today's Date: 02/27/2021    History of Present Illness Pt is a 80 y.o. male recently admitted to [facility] (02/12/21-02/14/21) with acute on chronic respiratory failure, now readmitted 02/14/21 due to weakness and SOB, unable to stand up from toilet requiring EMS call. Found to be hypoxic. Workup for acute on chronic hypoxic respiratory failure, possibly combination of acute on chronic dHF and PNA. PMH includes OSA, HTN, obesity, afib, CHF, venous stasis dermatitis.   Clinical Impression   Excellent session. Pt very motivated to participate with OT. Increased ability to mobilize to EOB and participate in precurser activities for standing. Pt completed @ 35 min of activity while sitting EOB with VSS. Asked nurse secretary to order bariatric recliner to progress pt OOB. Pt given exercises to complete over the weekend. Will continue to follow acutely.     Follow Up Recommendations  CIR    Equipment Recommendations  None recommended by OT    Recommendations for Other Services Rehab consult     Precautions / Restrictions Precautions Precautions: Fall Precaution Comments: watch O2 Required Braces or Orthoses: Other Brace Other Brace: Has bilateral knee braces/compression wrap (for stability); left knee hinged brace then places elastic sleeve over brace; rt knee prefers ace wrap tightly for support Restrictions Weight Bearing Restrictions: No      Mobility Bed Mobility Overal bed mobility: Needs Assistance Bed Mobility: Supine to Sit;Sit to Supine     Supine to sit: Min assist;HOB elevated (heavy use of rails) Sit to supine: Max assist (+3 required due to pt being lower on bed and requiring increased assistance to return to supine. Pt unable to laterally scoot on EOB)        Transfers                      Balance     Sitting balance-Leahy Scale: Fair                                      ADL either performed or assessed with clinical judgement   ADL Overall ADL's : Needs assistance/impaired                                  B knee "braces" donned with total A. At baseline, pt donns his own braces     General ADL Comments: At baseline wife assists with LB ADL. Pt has "no balance" so wife pulls his pants up after using the bathroom. Prefers to have shoes on during mobility.     Vision         Perception     Praxis      Pertinent Vitals/Pain Pain Assessment: Faces Faces Pain Scale: Hurts a little bit Pain Location: generalized with bed mobility Pain Descriptors / Indicators: Discomfort Pain Intervention(s): Limited activity within patient's tolerance     Hand Dominance     Extremity/Trunk Assessment             Communication     Cognition Arousal/Alertness: Awake/alert Behavior During Therapy: WFL for tasks assessed/performed Overall Cognitive Status: Within Functional Limits for tasks assessed  General Comments  Bed elevated to work on sustained engagement of BLE and pre-standing - focus on leaning forward to address weight shifts anteriorly with cross body work to address core strength.    Exercises Exercises: Other exercises Other Exercises Other Exercises: sit - stand with pushing form bed in elevated position x 10; 2 sets Other Exercises: BUE shoulder flexion AROM while sitting upsupported - tends to lean posteriorly Other Exercises: pursed lip breathing   Shoulder Instructions      Home Living                                          Prior Functioning/Environment                   OT Problem List:        OT Treatment/Interventions:      OT Goals(Current goals can be found in the care plan section) Acute Rehab OT Goals Patient Stated Goal: to get to rehab OT Goal Formulation: With patient/family Time For Goal  Achievement: 03/17/21 Potential to Achieve Goals: Good ADL Goals Pt Will Perform Lower Body Bathing: with mod assist;sitting/lateral leans Pt Will Perform Lower Body Dressing: with mod assist;sitting/lateral leans Pt Will Transfer to Toilet: with max assist;squat pivot transfer Pt Will Perform Toileting - Clothing Manipulation and hygiene: with mod assist;with adaptive equipment;sit to/from stand Additional ADL Goal #1: Pt will complete bed mobility with Min A.  OT Frequency: Min 5X/week   Barriers to D/C:            Co-evaluation PT/OT/SLP Co-Evaluation/Treatment:  (3 needed to mobilize from sit - supine)            AM-PAC OT "6 Clicks" Daily Activity     Outcome Measure Help from another person eating meals?: None Help from another person taking care of personal grooming?: A Little Help from another person toileting, which includes using toliet, bedpan, or urinal?: Total Help from another person bathing (including washing, rinsing, drying)?: A Lot Help from another person to put on and taking off regular upper body clothing?: A Lot Help from another person to put on and taking off regular lower body clothing?: Total 6 Click Score: 13   End of Session Equipment Utilized During Treatment: Oxygen (2-3L) Nurse Communication: Mobility status;Other (comment) (need for bariatric chair)  Activity Tolerance: Patient tolerated treatment well Patient left: in bed;with call bell/phone within reach;with bed alarm set;with family/visitor present  OT Visit Diagnosis: Muscle weakness (generalized) (M62.81);Other abnormalities of gait and mobility (R26.89);Unsteadiness on feet (R26.81) Pain - part of body: Knee (B)                Time: 5625-6389 OT Time Calculation (min): 49 min Charges:  OT General Charges $OT Visit: 1 Visit OT Treatments $Therapeutic Activity: 38-52 mins  Maurie Boettcher, OT/L   Acute OT Clinical Specialist [place] Pager [phone] Office  [phone]   [facility] 02/27/2021, 11:57 AM

## 2021-02-27 NOTE — Progress Notes (Signed)
Inpatient Rehab Admissions Coordinator  I do not have a bed for this Pt. Today. Case opened with Pt.'s insurance on 02/27/2019, I continue to await determination.Pt. and family updated  Clemens Catholic, [place], [place] Admissions Coordinator  [phone] ([place]) [phone] (office)

## 2021-02-27 NOTE — Care Management (Signed)
1152 02-27-21 Case Manager reached out to CIR and the insurance authorization was started for this patient. Insurance is pending at this time per admissions coordinator. Note to follow from admissions coordinator.

## 2021-02-28 DIAGNOSIS — J9601 Acute respiratory failure with hypoxia: Secondary | ICD-10-CM | POA: Diagnosis not present

## 2021-02-28 LAB — BASIC METABOLIC PANEL
Anion gap: 8 (ref 5–15)
BUN: 25 mg/dL — ABNORMAL HIGH (ref 8–23)
CO2: 31 mmol/L (ref 22–32)
Calcium: 9 mg/dL (ref 8.9–10.3)
Chloride: 101 mmol/L (ref 98–111)
Creatinine, Ser: 1.4 mg/dL — ABNORMAL HIGH (ref 0.61–1.24)
GFR, Estimated: 51 mL/min — ABNORMAL LOW (ref >60)
Glucose, Bld: 87 mg/dL (ref 70–99)
Potassium: 3.9 mmol/L (ref 3.5–5.1)
Sodium: 140 mmol/L (ref 135–145)

## 2021-02-28 LAB — MAGNESIUM: Magnesium: 2.1 mg/dL (ref 1.7–2.4)

## 2021-02-28 MED ORDER — LOPERAMIDE HCL 2 MG PO CAPS
2.0000 mg | ORAL_CAPSULE | ORAL | Status: DC | PRN
  Administered 2021-02-28 – 2021-03-02 (×2): 2 mg via ORAL
  Filled 2021-02-28 (×2): qty 1

## 2021-02-28 MED ORDER — CALCIUM CARBONATE ANTACID 500 MG PO CHEW
1.0000 | CHEWABLE_TABLET | Freq: Three times a day (TID) | ORAL | Status: DC | PRN
  Administered 2021-02-28: 200 mg via ORAL
  Filled 2021-02-28: qty 1

## 2021-02-28 NOTE — Progress Notes (Signed)
PROGRESS NOTE    [name]  YHC:[id number] DOB: 02-Apr-1941 DOA: 02/14/2021 PCP: [name], MD    Brief Narrative:  [name] is a 80 year old male with past medical history significant for paroxysmal atrial fibrillation, aortic stenosis s/p TAVR, chronic diastolic congestive heart failure, degenerative disc disease, gout, hyperlipidemia, hypertension, morbid obesity, OSA/hypoventilation syndrome, OA, CKD3a, peripheral neuropathy, CAD, history of prostate cancer, venous stasis dermatitis who presented to [facility] ED on 5/28 via EMS for weakness and dyspnea.  Patient reports was unable to get off the toilet with associated shortness of breath and generalized weakness.  Patient denies headache, no fever/chills, no nausea/vomiting/diarrhea, no palpitations, no diaphoresis, no abdominal pain.   Patient recently hospitalized at [facility] long hospital from  5/26 through 5/28 for decompensated OSA/OHS.  During that hospitalization, his BiPAP settings were optimized with a new facemask.  Patient also reports during the hospitalization he had very little mobility which he believes caused deconditioning when he returned home.   In the ED, temperature 99.2 F, HR 89, RR 29, BP 92/52, SPO2 97% on 3 L nasal cannula. Sodium 137, potassium 3.9, chloride 94, CO2 36, glucose 127, BUN 32, creatinine 1.67. WBC 10.6 (was 6.6 on 5/28 at 0127am), hemoglobin 12.2, platelets 131.  COVID-19 PCR negative.  Influenza A/B PCR negative.  Urinalysis with large leukocytes, negative nitrite, rare bacteria, greater than 50 WBCs.  VBG with pH 7.49, PaCO2 47.5, PaO2 56.  Chest x-ray with slight increase in right basilar airspace opacity compared to prior.  Blood cultures x2 ordered. Patient was started on cefepime and vancomycin in the ED for right lower lobe pneumonia. TRH consulted for further evaluation and management of pneumonia and progressive weakness.    Assessment & Plan:   Principal Problem:   Acute  respiratory failure with hypoxia (HCC) Active Problems:   HYPERCHOLESTEROLEMIA   Obesity, Class III, BMI 40-49.9 (morbid obesity) (HCC)   Macrocytic anemia   Obstructive sleep apnea   COPD mixed type (HCC)   Essential hypertension   CKD stage 3 secondary to diabetes (HCC)   Obesity hypoventilation syndrome (HCC)   Acute on chronic respiratory failure with hypoxemia (HCC)   Cervical stenosis of spine   Spinal cord myoclonus   Acute on chronic hypoxic respiratory failure, POA Community-acquired pneumonia Patient presenting to [facility] ED with complaints of productive cough with yellow sputum.  Chest x-ray shows progressive right basilar airspace opacity.  WBC elevated 10.6, up from 6.6 on discharge earlier in the day.  Lactic acid 1.4, procalcitonin elevated 0.44.  Patient was initially started on IV vancomycin, MRSA PCR was negative and was subsequently discontinued.  Patient completed 7-day course of IV cefepime. --Continue supplemental oxygen, maintain SPO2 greater than 88%, currently on 3L [place] (baseline requirement is 3L qHS w/ CPAP) but will likely need continuous oxygen on discharge.  Pleural effusion CT chest without contrast with moderate right, small left pleural effusions with associated L ectasis and consolidation.  IR was consulted and patient underwent thoracentesis with 700 mL of serosanguineous fluid removed.  Pleural fluid cultures negative.  Cytology negative for malignant cells.   Acute renal failure on CKD stage IIIa Baseline creatinine 1.1-1.2.  On discharge on 02/14/2021 was 1.17 and presented with a creatinine of 1.65.  Etiology likely secondary to prerenal azotemia in the setting of furosemide use versus ATN with borderline hypotension. --Cr 1.67>>1.37>>1.68>1.92>1.73>1.46 --Continue to hold home furosemide --Avoid nephrotoxins, renally dose all medications --BMP daily  Hypokalemia: Resolved Repleted --Repeat electrolytes in  a.m. to include magnesium   COPD,  not in acute exacerbation Continue home Trelegy Ellipta 1 puff daily --Continue supplemental oxygen as above   Obstructive sleep apnea Obesity hypoventilation syndrome Recently discharged by pulmonary critical care medicine on 5/28 with adjustments in his home Bipap settings.  Also fitted with a new mask. --Continue nocturnal Bipap with 3 L O2    Chronic diastolic congestive heart failure, compensated On furosemide 20 mg p.o. twice daily at home.  Borderline hypotensive and notable for renal insufficiency on admission.  Chest x-ray with no pulmonary edema. --continue to hold furosemide with increased creatinine --Strict I's and O's Daily weights   Dyslipidemia: Continue atorvastatin 80 mg p.o. daily   Peripheral neuropathy --Gabapentin 300 mg p.o. twice daily   History of gout: Allopurinol 100 mg p.o. daily   Morbid obesity Body mass index is 48.97 kg/m.  Discussed with patient needs for aggressive lifestyle changes/weight loss as this complicates all facets of care.  Outpatient follow-up with PCP.  May benefit from bariatric evaluation outpatient.   Weakness/deconditioning/debility: Patient reports significant deconditioning since recent hospitalization.  Utilizes a walker at baseline.  Social work unable to find a suitable SNF for placement.  Plan for aggressive therapy while inpatient with plans to return home with home health vs possible CIR admission.  Awaiting insurance authorization.  Physiologic tremor likely secondary to spinal myoclonus CT head with partially calcified meningioma posterior frontal falx measuring 2.6 x 2.3 x 2.4 cm, mass-effect upon medial frontal lobe with mild brain edema, not significantly changed from last year.  No acute stroke.  Patient unable to tolerate MR given his morbid obesity and unable to lay flat due to his body habitus.  EEG with mild diffuse encephalopathy of nonspecific etiology, no seizures or epileptiform discharges noted.  Seen by neurology  with patient known history of segmental spinal stenosis and cervical spine disease and this is likely related to spinal myoclonus; but given no recent acute exacerbation of chronic neck pain and patient unable to tolerate MRI due to pain and dyspnea while lying flat, recommended outpatient follow-up with neurology for arrangement of open MRI.  Diarrhea Episode of diarrhea overnight.  Patient relates this to something he ate yesterday.  Patient is afebrile.  Not on antibiotics. --Imodium as needed   DVT prophylaxis:  apixaban (ELIQUIS) tablet 5 mg    Code Status: Full Code Family Communication: No family present at bedside this morning  Disposition Plan:  Level of care: Telemetry Medical Status is: Inpatient  Remains inpatient appropriate because:Unsafe d/c plan   Dispo:  Patient From: Home  Planned Disposition: Home with Health Care Svc  Medically stable for discharge: No      Consultants:  Neurology Interventional radiology  Procedures:  Right thoracentesis, IR 02/18/2021  Antimicrobials:  Vancomycin 5/29 - 5/29 Cefepime 5/29 - 6/4    Subjective: Patient seen examined bedside, resting comfortably.  Reports diarrhea overnight, relates it to something he ate last night.  He is afebrile.  Patient wishes he could work with physical therapy today, but PT stated on the weekends unlikely due to staffing.  No other specific complaints or concerns at this time.  No family present at bedside.  Awaiting transfer to CIR on bed availability and insurance authorization. Patient denies headache, no visual changes, no chest pain, no palpitations, no shortness of breath more than his typical baseline, no abdominal pain, no weakness, no fatigue, no paresthesias.  No acute events overnight per nursing staff.  Objective: Vitals:  02/27/21 1653 02/27/21 2230 02/28/21 0620 02/28/21 0801  BP: 135/67  130/68   Pulse: 65 66 62 61  Resp: 18 18 17 16   Temp: (!) 97.5 F (36.4 C)  97.6 F (36.4  C)   TempSrc: Oral  Oral   SpO2: 93% 97% 90% 99%  Weight:      Height:        Intake/Output Summary (Last 24 hours) at 02/28/2021 1334 Last data filed at 02/28/2021 1100 Gross per 24 hour  Intake --  Output 700 ml  Net -700 ml   Filed Weights   02/21/21 0536 02/24/21 0431 02/25/21 0444  Weight: (!) 174.7 kg (!) 173.8 kg (!) 171.8 kg    Examination:  General exam: Appears calm and comfortable, obese Respiratory system: Breath sounds slightly decreased bilateral bases, no wheezes/crackles, normal respiratory effort on 3 L nasal cannula Cardiovascular system: S1 & S2 heard, RRR. No JVD, murmurs, rubs, gallops or clicks.  2+ pitting edema bilateral lower extremities to mid shin. Gastrointestinal system: Abdomen is nondistended, soft and nontender. No organomegaly or masses felt. Normal bowel sounds heard. Central nervous system: Alert and oriented. No focal neurological deficits. Extremities: Symmetric 5 x 5 power. Skin: Chronic venous changes bilateral lower extremities with cracking of skin, otherwise no other concerning rashes, lesions or ulcers Psychiatry: Judgement and insight appear normal. Mood & affect appropriate.       Data Reviewed: I have personally reviewed following labs and imaging studies  CBC: Recent Labs  Lab 02/22/21 0221  WBC 6.0  NEUTROABS 4.3  HGB 10.9*  HCT 34.3*  MCV 103.9*  PLT 939   Basic Metabolic Panel: Recent Labs  Lab 02/22/21 1639 02/23/21 0752 02/24/21 0321 02/25/21 0305 02/26/21 0323 02/28/21 0209  NA 141 140 139 141 139 140  K 3.9 3.9 3.7 3.4* 3.6 3.9  CL 99 99 97* 96* 97* 101  CO2 34* 34* 35* 37* 35* 31  GLUCOSE 115* 95 101* 96 95 87  BUN 34* 34* 37* 39* 33* 25*  CREATININE 1.59* 1.68* 1.92* 1.73* 1.46* 1.40*  CALCIUM 9.0 9.0 8.6* 9.1 9.2 9.0  MG 2.1  --   --   --  2.1 2.1  PHOS 4.0  --   --   --   --   --    GFR: Estimated Creatinine Clearance: 72.6 mL/min (A) (by C-G formula based on SCr of 1.4 mg/dL (H)). Liver  Function Tests: No results for input(s): AST, ALT, ALKPHOS, BILITOT, PROT, ALBUMIN in the last 168 hours. No results for input(s): LIPASE, AMYLASE in the last 168 hours. No results for input(s): AMMONIA in the last 168 hours. Coagulation Profile: No results for input(s): INR, PROTIME in the last 168 hours. Cardiac Enzymes: No results for input(s): CKTOTAL, CKMB, CKMBINDEX, TROPONINI in the last 168 hours. BNP (last 3 results) No results for input(s): PROBNP in the last 8760 hours. HbA1C: No results for input(s): HGBA1C in the last 72 hours. CBG: Recent Labs  Lab 02/22/21 1611 02/24/21 2120 02/25/21 2128  GLUCAP 116* 129* 123*   Lipid Profile: No results for input(s): CHOL, HDL, LDLCALC, TRIG, CHOLHDL, LDLDIRECT in the last 72 hours. Thyroid Function Tests: No results for input(s): TSH, T4TOTAL, FREET4, T3FREE, THYROIDAB in the last 72 hours. Anemia Panel: No results for input(s): VITAMINB12, FOLATE, FERRITIN, TIBC, IRON, RETICCTPCT in the last 72 hours. Sepsis Labs: No results for input(s): PROCALCITON, LATICACIDVEN in the last 168 hours.  No results found for this or any previous visit (from  the past 240 hour(s)).        Radiology Studies: No results found.       Scheduled Meds:  allopurinol  100 mg Oral Daily   apixaban  5 mg Oral BID   vitamin C  500 mg Oral Daily   atorvastatin  80 mg Oral Daily   famotidine  40 mg Oral Daily   fluticasone furoate-vilanterol  1 puff Inhalation Daily   And   umeclidinium bromide  1 puff Inhalation Daily   gabapentin  300 mg Oral BID   polyethylene glycol  17 g Oral Daily   Continuous Infusions:   LOS: 13 days    Time spent: 35 minutes spent on chart review, discussion with nursing staff, consultants, updating family and interview/physical exam; more than 50% of that time was spent in counseling and/or coordination of care.    Avigdor Dollar J [place] DO [facility] Available via Epic secure chat 7am-7pm After these  hours, please refer to coverage provider listed on [email or web address] 02/28/2021, 1:34 PM

## 2021-03-01 DIAGNOSIS — J9601 Acute respiratory failure with hypoxia: Secondary | ICD-10-CM | POA: Diagnosis not present

## 2021-03-01 NOTE — Progress Notes (Signed)
PROGRESS NOTE    [name]  NOM:[id number] DOB: 11-09-1940 DOA: 02/14/2021 PCP: [name], MD    Brief Narrative:  [name] is a 80 year old male with past medical history significant for paroxysmal atrial fibrillation, aortic stenosis s/p TAVR, chronic diastolic congestive heart failure, degenerative disc disease, gout, hyperlipidemia, hypertension, morbid obesity, OSA/hypoventilation syndrome, OA, CKD3a, peripheral neuropathy, CAD, history of prostate cancer, venous stasis dermatitis who presented to [facility] ED on 5/28 via EMS for weakness and dyspnea.  Patient reports was unable to get off the toilet with associated shortness of breath and generalized weakness.  Patient denies headache, no fever/chills, no nausea/vomiting/diarrhea, no palpitations, no diaphoresis, no abdominal pain.   Patient recently hospitalized at [facility] hospital from  5/26 through 5/28 for decompensated OSA/OHS.  During that hospitalization, his BiPAP settings were optimized with a new facemask.  Patient also reports during the hospitalization he had very little mobility which he believes caused deconditioning when he returned home.   In the ED, temperature 99.2 F, HR 89, RR 29, BP 92/52, SPO2 97% on 3 L nasal cannula. Sodium 137, potassium 3.9, chloride 94, CO2 36, glucose 127, BUN 32, creatinine 1.67. WBC 10.6 (was 6.6 on 5/28 at 0127am), hemoglobin 12.2, platelets 131.  COVID-19 PCR negative.  Influenza A/B PCR negative.  Urinalysis with large leukocytes, negative nitrite, rare bacteria, greater than 50 WBCs.  VBG with pH 7.49, PaCO2 47.5, PaO2 56.  Chest x-ray with slight increase in right basilar airspace opacity compared to prior.  Blood cultures x2 ordered. Patient was started on cefepime and vancomycin in the ED for right lower lobe pneumonia. TRH consulted for further evaluation and management of pneumonia and progressive weakness.    Assessment & Plan:   Principal Problem:   Acute  respiratory failure with hypoxia (HCC) Active Problems:   HYPERCHOLESTEROLEMIA   Obesity, Class III, BMI 40-49.9 (morbid obesity) (HCC)   Macrocytic anemia   Obstructive sleep apnea   COPD mixed type (HCC)   Essential hypertension   CKD stage 3 secondary to diabetes (HCC)   Obesity hypoventilation syndrome (HCC)   Acute on chronic respiratory failure with hypoxemia (HCC)   Cervical stenosis of spine   Spinal cord myoclonus   Acute on chronic hypoxic respiratory failure, POA Community-acquired pneumonia Patient presenting to [facility] ED with complaints of productive cough with yellow sputum.  Chest x-ray shows progressive right basilar airspace opacity.  WBC elevated 10.6, up from 6.6 on discharge earlier in the day.  Lactic acid 1.4, procalcitonin elevated 0.44.  Patient was initially started on IV vancomycin, MRSA PCR was negative and was subsequently discontinued.  Patient completed 7-day course of IV cefepime. --Continue supplemental oxygen, maintain SPO2 greater than 88%, currently on 3L [place] (baseline requirement is 3L qHS w/ CPAP) but will likely need continuous oxygen on discharge.  Pleural effusion CT chest without contrast with moderate right, small left pleural effusions with associated L ectasis and consolidation.  IR was consulted and patient underwent thoracentesis with 700 mL of serosanguineous fluid removed.  Pleural fluid cultures negative.  Cytology negative for malignant cells.   Acute renal failure on CKD stage IIIa Baseline creatinine 1.1-1.2.  On discharge on 02/14/2021 was 1.17 and presented with a creatinine of 1.65.  Etiology likely secondary to prerenal azotemia in the setting of furosemide use versus ATN with borderline hypotension. --Cr 1.67>>1.37>>1.68>1.92>1.73>1.46 --Continue to hold home furosemide --Avoid nephrotoxins, renally dose all medications --BMP daily  Hypokalemia: Resolved Repleted --Repeat electrolytes in  a.m. to include magnesium   COPD,  not in acute exacerbation Continue home Trelegy Ellipta 1 puff daily --Continue supplemental oxygen as above   Obstructive sleep apnea Obesity hypoventilation syndrome Recently discharged by pulmonary critical care medicine on 5/28 with adjustments in his home Bipap settings.  Also fitted with a new mask. --Continue nocturnal Bipap with 3 L O2    Chronic diastolic congestive heart failure, compensated On furosemide 20 mg p.o. twice daily at home.  Borderline hypotensive and notable for renal insufficiency on admission.  Chest x-ray with no pulmonary edema. --continue to hold furosemide with increased creatinine --Strict I's and O's Daily weights   Dyslipidemia: Continue atorvastatin 80 mg p.o. daily   Peripheral neuropathy --Gabapentin 300 mg p.o. twice daily   History of gout: Allopurinol 100 mg p.o. daily   Morbid obesity Body mass index is 48.97 kg/m.  Discussed with patient needs for aggressive lifestyle changes/weight loss as this complicates all facets of care.  Outpatient follow-up with PCP.  May benefit from bariatric evaluation outpatient.   Weakness/deconditioning/debility: Patient reports significant deconditioning since recent hospitalization.  Utilizes a walker at baseline.  Social work unable to find a suitable SNF for placement.  Plan for aggressive therapy while inpatient with plans to return home with home health vs possible CIR admission.  Awaiting insurance authorization.  Physiologic tremor likely secondary to spinal myoclonus CT head with partially calcified meningioma posterior frontal falx measuring 2.6 x 2.3 x 2.4 cm, mass-effect upon medial frontal lobe with mild brain edema, not significantly changed from last year.  No acute stroke.  Patient unable to tolerate MR given his morbid obesity and unable to lay flat due to his body habitus.  EEG with mild diffuse encephalopathy of nonspecific etiology, no seizures or epileptiform discharges noted.  Seen by neurology  with patient known history of segmental spinal stenosis and cervical spine disease and this is likely related to spinal myoclonus; but given no recent acute exacerbation of chronic neck pain and patient unable to tolerate MRI due to pain and dyspnea while lying flat, recommended outpatient follow-up with neurology for arrangement of open MRI.  Diarrhea Episode of diarrhea overnight.  Patient relates this to something he ate yesterday.  Patient is afebrile.  Not on antibiotics. --Imodium as needed   DVT prophylaxis:  apixaban (ELIQUIS) tablet 5 mg    Code Status: Full Code Family Communication: No family present at bedside this morning  Disposition Plan:  Level of care: Telemetry Medical Status is: Inpatient  Remains inpatient appropriate because:Unsafe d/c plan   Dispo:  Patient From: Home  Planned Disposition: Home with Health Care Svc  Medically stable for discharge: No      Consultants:  Neurology Interventional radiology  Procedures:  Right thoracentesis, IR 02/18/2021  Antimicrobials:  Vancomycin 5/29 - 5/29 Cefepime 5/29 - 6/4    Subjective: Patient seen examined bedside, resting comfortably.  No complaints this morning.  Diarrhea resolved.  Reports good sleep overnight.  No family present at bedside this morning.  Awaiting transfer to CIR when bed available and insurance authorization completed. Patient denies headache, no visual changes, no chest pain, no palpitations, no shortness of breath more than his typical baseline, no abdominal pain, no weakness, no fatigue, no paresthesias.  No acute events overnight per nursing staff.  Objective: Vitals:   02/28/21 2142 03/01/21 0700 03/01/21 0755 03/01/21 0933  BP:  125/79  126/76  Pulse: 61 62    Resp: 19 20    Temp:  98 F (36.7 C)  TempSrc:    Oral  SpO2: 99% 99% 95%   Weight:      Height:        Intake/Output Summary (Last 24 hours) at 03/01/2021 1124 Last data filed at 03/01/2021 0700 Gross per 24 hour   Intake --  Output 550 ml  Net -550 ml   Filed Weights   02/21/21 0536 02/24/21 0431 02/25/21 0444  Weight: (!) 174.7 kg (!) 173.8 kg (!) 171.8 kg    Examination:  General exam: Appears calm and comfortable, obese Respiratory system: Breath sounds slightly decreased bilateral bases, no wheezes/crackles, normal respiratory effort on 3 L nasal cannula Cardiovascular system: S1 & S2 heard, RRR. No JVD, murmurs, rubs, gallops or clicks.  2+ pitting edema bilateral lower extremities to mid shin. Gastrointestinal system: Abdomen is nondistended, soft and nontender. No organomegaly or masses felt. Normal bowel sounds heard. Central nervous system: Alert and oriented. No focal neurological deficits. Extremities: Symmetric 5 x 5 power. Skin: Chronic venous changes bilateral lower extremities with cracking of skin, otherwise no other concerning rashes, lesions or ulcers Psychiatry: Judgement and insight appear normal. Mood & affect appropriate.       Data Reviewed: I have personally reviewed following labs and imaging studies  CBC: No results for input(s): WBC, NEUTROABS, HGB, HCT, MCV, PLT in the last 168 hours.  Basic Metabolic Panel: Recent Labs  Lab 02/22/21 1639 02/23/21 0752 02/24/21 0321 02/25/21 0305 02/26/21 0323 02/28/21 0209  NA 141 140 139 141 139 140  K 3.9 3.9 3.7 3.4* 3.6 3.9  CL 99 99 97* 96* 97* 101  CO2 34* 34* 35* 37* 35* 31  GLUCOSE 115* 95 101* 96 95 87  BUN 34* 34* 37* 39* 33* 25*  CREATININE 1.59* 1.68* 1.92* 1.73* 1.46* 1.40*  CALCIUM 9.0 9.0 8.6* 9.1 9.2 9.0  MG 2.1  --   --   --  2.1 2.1  PHOS 4.0  --   --   --   --   --    GFR: Estimated Creatinine Clearance: 72.6 mL/min (A) (by C-G formula based on SCr of 1.4 mg/dL (H)). Liver Function Tests: No results for input(s): AST, ALT, ALKPHOS, BILITOT, PROT, ALBUMIN in the last 168 hours. No results for input(s): LIPASE, AMYLASE in the last 168 hours. No results for input(s): AMMONIA in the last 168  hours. Coagulation Profile: No results for input(s): INR, PROTIME in the last 168 hours. Cardiac Enzymes: No results for input(s): CKTOTAL, CKMB, CKMBINDEX, TROPONINI in the last 168 hours. BNP (last 3 results) No results for input(s): PROBNP in the last 8760 hours. HbA1C: No results for input(s): HGBA1C in the last 72 hours. CBG: Recent Labs  Lab 02/22/21 1611 02/24/21 2120 02/25/21 2128  GLUCAP 116* 129* 123*   Lipid Profile: No results for input(s): CHOL, HDL, LDLCALC, TRIG, CHOLHDL, LDLDIRECT in the last 72 hours. Thyroid Function Tests: No results for input(s): TSH, T4TOTAL, FREET4, T3FREE, THYROIDAB in the last 72 hours. Anemia Panel: No results for input(s): VITAMINB12, FOLATE, FERRITIN, TIBC, IRON, RETICCTPCT in the last 72 hours. Sepsis Labs: No results for input(s): PROCALCITON, LATICACIDVEN in the last 168 hours.  No results found for this or any previous visit (from the past 240 hour(s)).        Radiology Studies: No results found.       Scheduled Meds:  allopurinol  100 mg Oral Daily   apixaban  5 mg Oral BID   vitamin C  500  mg Oral Daily   atorvastatin  80 mg Oral Daily   famotidine  40 mg Oral Daily   fluticasone furoate-vilanterol  1 puff Inhalation Daily   And   umeclidinium bromide  1 puff Inhalation Daily   gabapentin  300 mg Oral BID   polyethylene glycol  17 g Oral Daily   Continuous Infusions:   LOS: 14 days    Time spent: 32 minutes spent on chart review, discussion with nursing staff, consultants, updating family and interview/physical exam; more than 50% of that time was spent in counseling and/or coordination of care.    Maryella Abood J [place] DO [facility] Available via Epic secure chat 7am-7pm After these hours, please refer to coverage provider listed on [email or web address] 03/01/2021, 11:24 AM

## 2021-03-01 NOTE — Plan of Care (Signed)
  Problem: Education: Goal: Knowledge of General Education information will improve Description Including pain rating scale, medication(s)/side effects and non-pharmacologic comfort measures Outcome: Progressing   Problem: Health Behavior/Discharge Planning: Goal: Ability to manage health-related needs will improve Outcome: Progressing   

## 2021-03-02 ENCOUNTER — Encounter (HOSPITAL_COMMUNITY): Payer: Self-pay | Admitting: Physical Medicine & Rehabilitation

## 2021-03-02 ENCOUNTER — Other Ambulatory Visit: Payer: Self-pay

## 2021-03-02 ENCOUNTER — Inpatient Hospital Stay (HOSPITAL_COMMUNITY)
Admission: RE | Admit: 2021-03-02 | Discharge: 2021-03-18 | DRG: 945 | Disposition: A | Discharge status: Home Health | Payer: Medicare HMO | Source: Intra-hospital | Attending: Physical Medicine & Rehabilitation | Admitting: Physical Medicine & Rehabilitation

## 2021-03-02 DIAGNOSIS — N1831 Chronic kidney disease, stage 3a: Secondary | ICD-10-CM | POA: Diagnosis present

## 2021-03-02 DIAGNOSIS — N179 Acute kidney failure, unspecified: Secondary | ICD-10-CM | POA: Diagnosis present

## 2021-03-02 DIAGNOSIS — R197 Diarrhea, unspecified: Secondary | ICD-10-CM | POA: Diagnosis not present

## 2021-03-02 DIAGNOSIS — C61 Malignant neoplasm of prostate: Secondary | ICD-10-CM | POA: Diagnosis not present

## 2021-03-02 DIAGNOSIS — D62 Acute posthemorrhagic anemia: Secondary | ICD-10-CM | POA: Diagnosis not present

## 2021-03-02 DIAGNOSIS — G7281 Critical illness myopathy: Secondary | ICD-10-CM | POA: Diagnosis not present

## 2021-03-02 DIAGNOSIS — L89152 Pressure ulcer of sacral region, stage 2: Secondary | ICD-10-CM | POA: Diagnosis present

## 2021-03-02 DIAGNOSIS — I5032 Chronic diastolic (congestive) heart failure: Secondary | ICD-10-CM | POA: Diagnosis not present

## 2021-03-02 DIAGNOSIS — I130 Hypertensive heart and chronic kidney disease with heart failure and stage 1 through stage 4 chronic kidney disease, or unspecified chronic kidney disease: Secondary | ICD-10-CM | POA: Diagnosis present

## 2021-03-02 DIAGNOSIS — I4510 Unspecified right bundle-branch block: Secondary | ICD-10-CM | POA: Diagnosis present

## 2021-03-02 DIAGNOSIS — R3915 Urgency of urination: Secondary | ICD-10-CM | POA: Diagnosis not present

## 2021-03-02 DIAGNOSIS — Z96653 Presence of artificial knee joint, bilateral: Secondary | ICD-10-CM | POA: Diagnosis present

## 2021-03-02 DIAGNOSIS — L8990 Pressure ulcer of unspecified site, unspecified stage: Secondary | ICD-10-CM | POA: Insufficient documentation

## 2021-03-02 DIAGNOSIS — J90 Pleural effusion, not elsewhere classified: Secondary | ICD-10-CM | POA: Diagnosis not present

## 2021-03-02 DIAGNOSIS — M109 Gout, unspecified: Secondary | ICD-10-CM | POA: Diagnosis present

## 2021-03-02 DIAGNOSIS — Z8261 Family history of arthritis: Secondary | ICD-10-CM

## 2021-03-02 DIAGNOSIS — Z888 Allergy status to other drugs, medicaments and biological substances status: Secondary | ICD-10-CM

## 2021-03-02 DIAGNOSIS — I4819 Other persistent atrial fibrillation: Secondary | ICD-10-CM | POA: Diagnosis present

## 2021-03-02 DIAGNOSIS — R5381 Other malaise: Secondary | ICD-10-CM | POA: Diagnosis not present

## 2021-03-02 DIAGNOSIS — R062 Wheezing: Secondary | ICD-10-CM | POA: Diagnosis not present

## 2021-03-02 DIAGNOSIS — G4733 Obstructive sleep apnea (adult) (pediatric): Secondary | ICD-10-CM | POA: Diagnosis not present

## 2021-03-02 DIAGNOSIS — Z86718 Personal history of other venous thrombosis and embolism: Secondary | ICD-10-CM

## 2021-03-02 DIAGNOSIS — J449 Chronic obstructive pulmonary disease, unspecified: Secondary | ICD-10-CM | POA: Diagnosis present

## 2021-03-02 DIAGNOSIS — Z7901 Long term (current) use of anticoagulants: Secondary | ICD-10-CM

## 2021-03-02 DIAGNOSIS — Z6841 Body Mass Index (BMI) 40.0 and over, adult: Secondary | ICD-10-CM

## 2021-03-02 DIAGNOSIS — Z79899 Other long term (current) drug therapy: Secondary | ICD-10-CM

## 2021-03-02 DIAGNOSIS — J9611 Chronic respiratory failure with hypoxia: Secondary | ICD-10-CM | POA: Diagnosis present

## 2021-03-02 DIAGNOSIS — G629 Polyneuropathy, unspecified: Secondary | ICD-10-CM | POA: Diagnosis present

## 2021-03-02 DIAGNOSIS — I872 Venous insufficiency (chronic) (peripheral): Secondary | ICD-10-CM | POA: Diagnosis present

## 2021-03-02 DIAGNOSIS — Z882 Allergy status to sulfonamides status: Secondary | ICD-10-CM

## 2021-03-02 DIAGNOSIS — R0602 Shortness of breath: Secondary | ICD-10-CM

## 2021-03-02 DIAGNOSIS — J9811 Atelectasis: Secondary | ICD-10-CM | POA: Diagnosis not present

## 2021-03-02 DIAGNOSIS — Z8042 Family history of malignant neoplasm of prostate: Secondary | ICD-10-CM

## 2021-03-02 DIAGNOSIS — E662 Morbid (severe) obesity with alveolar hypoventilation: Secondary | ICD-10-CM | POA: Diagnosis present

## 2021-03-02 DIAGNOSIS — I2510 Atherosclerotic heart disease of native coronary artery without angina pectoris: Secondary | ICD-10-CM | POA: Diagnosis present

## 2021-03-02 DIAGNOSIS — M21371 Foot drop, right foot: Secondary | ICD-10-CM | POA: Diagnosis present

## 2021-03-02 DIAGNOSIS — E785 Hyperlipidemia, unspecified: Secondary | ICD-10-CM | POA: Diagnosis present

## 2021-03-02 DIAGNOSIS — N183 Chronic kidney disease, stage 3 unspecified: Secondary | ICD-10-CM | POA: Diagnosis present

## 2021-03-02 DIAGNOSIS — Z9981 Dependence on supplemental oxygen: Secondary | ICD-10-CM

## 2021-03-02 DIAGNOSIS — Z7951 Long term (current) use of inhaled steroids: Secondary | ICD-10-CM

## 2021-03-02 DIAGNOSIS — I080 Rheumatic disorders of both mitral and aortic valves: Secondary | ICD-10-CM | POA: Diagnosis present

## 2021-03-02 DIAGNOSIS — K5900 Constipation, unspecified: Secondary | ICD-10-CM | POA: Diagnosis present

## 2021-03-02 DIAGNOSIS — Z950 Presence of cardiac pacemaker: Secondary | ICD-10-CM

## 2021-03-02 DIAGNOSIS — R350 Frequency of micturition: Secondary | ICD-10-CM | POA: Diagnosis not present

## 2021-03-02 DIAGNOSIS — M1990 Unspecified osteoarthritis, unspecified site: Secondary | ICD-10-CM | POA: Diagnosis present

## 2021-03-02 DIAGNOSIS — J9601 Acute respiratory failure with hypoxia: Secondary | ICD-10-CM | POA: Diagnosis not present

## 2021-03-02 DIAGNOSIS — I10 Essential (primary) hypertension: Secondary | ICD-10-CM | POA: Diagnosis present

## 2021-03-02 DIAGNOSIS — E1122 Type 2 diabetes mellitus with diabetic chronic kidney disease: Secondary | ICD-10-CM | POA: Diagnosis present

## 2021-03-02 DIAGNOSIS — J441 Chronic obstructive pulmonary disease with (acute) exacerbation: Secondary | ICD-10-CM | POA: Diagnosis not present

## 2021-03-02 DIAGNOSIS — Z952 Presence of prosthetic heart valve: Secondary | ICD-10-CM

## 2021-03-02 MED ORDER — ATORVASTATIN CALCIUM 80 MG PO TABS
80.0000 mg | ORAL_TABLET | Freq: Every day | ORAL | Status: DC
  Administered 2021-03-03 – 2021-03-18 (×16): 80 mg via ORAL
  Filled 2021-03-02 (×16): qty 1

## 2021-03-02 MED ORDER — ALLOPURINOL 100 MG PO TABS
100.0000 mg | ORAL_TABLET | Freq: Every day | ORAL | Status: DC
  Administered 2021-03-03 – 2021-03-18 (×16): 100 mg via ORAL
  Filled 2021-03-02 (×14): qty 1

## 2021-03-02 MED ORDER — GABAPENTIN 300 MG PO CAPS
300.0000 mg | ORAL_CAPSULE | Freq: Two times a day (BID) | ORAL | Status: DC
  Administered 2021-03-02 – 2021-03-18 (×32): 300 mg via ORAL
  Filled 2021-03-02 (×32): qty 1

## 2021-03-02 MED ORDER — APIXABAN 5 MG PO TABS
5.0000 mg | ORAL_TABLET | Freq: Two times a day (BID) | ORAL | Status: DC
  Administered 2021-03-02 – 2021-03-18 (×32): 5 mg via ORAL
  Filled 2021-03-02 (×32): qty 1

## 2021-03-02 MED ORDER — ALBUTEROL SULFATE (2.5 MG/3ML) 0.083% IN NEBU
2.5000 mg | INHALATION_SOLUTION | RESPIRATORY_TRACT | Status: DC | PRN
  Filled 2021-03-02: qty 3

## 2021-03-02 MED ORDER — ASCORBIC ACID 500 MG PO TABS
500.0000 mg | ORAL_TABLET | Freq: Every day | ORAL | Status: DC
  Administered 2021-03-03 – 2021-03-18 (×16): 500 mg via ORAL
  Filled 2021-03-02 (×16): qty 1

## 2021-03-02 MED ORDER — FUROSEMIDE 20 MG PO TABS: 20.0000 mg | ORAL_TABLET | Freq: Every day | ORAL | 1 refills | Status: DC | PRN

## 2021-03-02 MED ORDER — ACETAMINOPHEN 325 MG PO TABS: 650.0000 mg | ORAL_TABLET | ORAL | 2 refills | Status: DC | PRN

## 2021-03-02 MED ORDER — FAMOTIDINE 20 MG PO TABS
40.0000 mg | ORAL_TABLET | Freq: Every day | ORAL | Status: DC
  Administered 2021-03-03 – 2021-03-18 (×16): 40 mg via ORAL
  Filled 2021-03-02 (×16): qty 2

## 2021-03-02 MED ORDER — CALCIUM CARBONATE ANTACID 500 MG PO CHEW: 1.0000 | CHEWABLE_TABLET | Freq: Three times a day (TID) | ORAL | Status: DC | PRN

## 2021-03-02 MED ORDER — FLUTICASONE FUROATE-VILANTEROL 100-25 MCG/INH IN AEPB
1.0000 | INHALATION_SPRAY | Freq: Every day | RESPIRATORY_TRACT | Status: DC
  Administered 2021-03-03 – 2021-03-18 (×15): 1 via RESPIRATORY_TRACT
  Filled 2021-03-02 (×2): qty 28

## 2021-03-02 MED ORDER — UMECLIDINIUM BROMIDE 62.5 MCG/INH IN AEPB
1.0000 | INHALATION_SPRAY | Freq: Every day | RESPIRATORY_TRACT | Status: DC
  Administered 2021-03-03 – 2021-03-17 (×13): 1 via RESPIRATORY_TRACT
  Filled 2021-03-02 (×5): qty 7

## 2021-03-02 MED ORDER — POLYETHYLENE GLYCOL 3350 17 G PO PACK: 17.0000 g | PACK | Freq: Every day | ORAL | 0 refills | Status: AC | PRN

## 2021-03-02 MED ORDER — POLYETHYLENE GLYCOL 3350 17 G PO PACK
17.0000 g | PACK | Freq: Every day | ORAL | Status: DC
  Administered 2021-03-04 – 2021-03-05 (×2): 17 g via ORAL
  Filled 2021-03-02 (×3): qty 1

## 2021-03-02 NOTE — Progress Notes (Signed)
Physical Therapy Treatment Patient Details Name: [name] MRN: [id number] DOB: 07-03-1941 Today's Date: 03/02/2021    History of Present Illness Pt is a 80 y.o. male recently admitted to [facility] (02/12/21-02/14/21) with acute on chronic respiratory failure, now readmitted 02/14/21 due to weakness and SOB, unable to stand up from toilet requiring EMS call. Found to be hypoxic. Workup for acute on chronic hypoxic respiratory failure, likely combination of acute on chronic dHF and PNA. Chest CT with L pleural effusions; s/p thoracentesis. 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 PMH includes OSA, HTN, obesity, afib, CHF, venous stasis dermatitis.    PT Comments    Patient remains eager to work with therapies. Noted he was unable to move his LLE in standing when wearing gripper socks this a.m. with OT. PT has previously been using his sneakers without success. Patient reports at home he goes barefooted and tried mobility without shoes this p.m. Patient was able to minimally move his left heel in attempts to [facility] and advance LLE--this was progress from no movement at all! Repeated sit to stand and exercises for remainder of session.      Follow Up Recommendations  CIR     Equipment Recommendations   (TBD)    Recommendations for Other Services Rehab consult     Precautions / Restrictions Precautions Precautions: Fall;Other (comment) Precaution Comments: Watch SpO2 Required Braces or Orthoses: Other Brace Other Brace: Has bilateral knee braces/compression wrap (for stability); left knee hinged brace then places elastic sleeve over brace; rt knee prefers ace wrap tightly for support    Mobility  Bed Mobility Overal bed mobility: Needs Assistance Bed Mobility: Supine to Sit;Sit to Supine     Supine to sit: HOB elevated;Min assist Sit to supine: Max assist;+2 for physical assistance   General bed mobility comments: pt  able to maneuver BLES to EOB with increased time/effort and min assist, cues needed to reach RUE over to bed rail, pt pushes up on left elbow and pulls up on rail with RUE (no assist to raise torso or scoot hip to EOB. MAX A +2 to fully return to supine, pt able to descend onto pts LUE but needed MAX A +2 to elevate BLEs and lower trunk back down to bed    Transfers Overall transfer level: Needs assistance Equipment used: Bilateral platform walker Transfers: Sit to/from Stand Sit to Stand: Min assist;+2 physical assistance;From elevated surface         General transfer comment: Bilateral knee braces/wraps donned prior to standing; no shoes/bare feet to simulate how he walks at home (typically no shoes) bed elevated per pt request to simulate bed height at home; pt insistent on pulling up on RW, MIN A +2 to power up in standing. completed sit<>stand x2 trials and unable to fully stand on 3rd atttempt. attempted lateral weight shifting with heel toe scooting with LLE. pt able to minimally move left heel (when previously with shoes or gripper socks has not been able to move at all)  Ambulation/Gait             General Gait Details: unable this session; pt can take full weight on bil UEs and LLE to allow him to move/advance RLE, however can't do the opposite leg   Stairs             Wheelchair Mobility    Modified Rankin (Stroke Patients Only)       Balance Overall balance assessment:  Needs assistance Sitting-balance support: Feet supported Sitting balance-Leahy Scale: Fair Sitting balance - Comments: Able to more quickly achieve static sitting balance, scooting hips to EOB well with UE support pulling on edge of mattress; requires assist to don bilaterl knee braces and shoes Postural control: Posterior lean Standing balance support: Bilateral upper extremity supported Standing balance-Leahy Scale: Poor Standing balance comment: Heavy reliance on BUE support as well as BLEs  locked in extension                            Cognition Arousal/Alertness: Awake/alert Behavior During Therapy: WFL for tasks assessed/performed Overall Cognitive Status: Within Functional Limits for tasks assessed                                 General Comments: Verbose with conversation requiring redirection to task      Exercises Total Joint Exercises Ankle Circles/Pumps:  (pt reports he did earlier on his own) Heel Slides: AROM;Both;10 reps (AAROM to incr ROM) Hip ABduction/ADduction:  (pt reports he did earlier on his own) Straight Leg Raises:  (pt reports he did earlier on his own)    General Comments        Pertinent Vitals/Pain Faces Pain Scale: No hurt    Home Living                      Prior Function            PT Goals (current goals can now be found in the care plan section) Acute Rehab PT Goals Patient Stated Goal: to get to rehab Time For Goal Achievement: 03/13/21 Potential to Achieve Goals: Good Progress towards PT goals: Progressing toward goals    Frequency    Min 5X/week      PT Plan Frequency needs to be updated    Co-evaluation              AM-PAC PT "6 Clicks" Mobility   Outcome Measure  Help needed turning from your back to your side while in a flat bed without using bedrails?: Total Help needed moving from lying on your back to sitting on the side of a flat bed without using bedrails?: Total Help needed moving to and from a bed to a chair (including a wheelchair)?: Total Help needed standing up from a chair using your arms (e.g., wheelchair or bedside chair)?: Total Help needed to walk in hospital room?: Total Help needed climbing 3-5 steps with a railing? : Total 6 Click Score: 6    End of Session Equipment Utilized During Treatment: Gait belt;Oxygen Activity Tolerance: Patient tolerated treatment well Patient left: in bed;with call bell/phone within reach;with family/visitor  present;with nursing/sitter in room   PT Visit Diagnosis: Other abnormalities of gait and mobility (R26.89)     Time: 3428-7681 PT Time Calculation (min) (ACUTE ONLY): 45 min  Charges:  $Therapeutic Activity: 38-52 mins                      Arby Barrette, PT Pager [phone]    Rexanne Mano 03/02/2021, 3:59 PM

## 2021-03-02 NOTE — Progress Notes (Signed)
Patient arrived on unit, oriented to unit. Reviewed medications, therapy schedule, rehab routine and plan of care. States an understanding of information reviewed. No complications noted at this time. Patient reports no pain and is AX4 Geovany Trudo L Jacobus Colvin  

## 2021-03-02 NOTE — TOC Transition Note (Signed)
Transition of Care [facility]) - CM/SW Discharge Note   Patient Details  Name: [name] MRN: [id number] Date of Birth: Nov 17, 1940  Transition of Care [facility] CM/SW Contact:  Bethena Roys, RN Phone Number: 03/02/2021, 12:49 PM   Clinical Narrative:  Admissions Coordinator received insurance authorization. Patient will transition to CIR today. No further needs from Case Manager at this time.   Final next level of care: IP Rehab Facility Barriers to Discharge: No Barriers Identified   Patient Goals and CMS Choice Patient states their goals for this hospitalization and ongoing recovery are:: to CIR once bed available. CMS Medicare.gov Compare Post Acute Care list provided to:: Patient Choice offered to / list presented to : Patient  Discharge Plan and Services In-house Referral: Clinical Social Work   Post Acute Care Choice: IP Rehab               Readmission Risk Interventions No flowsheet data found.

## 2021-03-02 NOTE — Progress Notes (Signed)
PROGRESS NOTE    [name]  MLY:[id number] DOB: 1941-02-23 DOA: 02/14/2021 PCP: [name], MD    Brief Narrative:  [name] is a 80 year old male with past medical history significant for paroxysmal atrial fibrillation, aortic stenosis s/p TAVR, chronic diastolic congestive heart failure, degenerative disc disease, gout, hyperlipidemia, hypertension, morbid obesity, OSA/hypoventilation syndrome, OA, CKD3a, peripheral neuropathy, CAD, history of prostate cancer, venous stasis dermatitis who presented to [facility] ED on 5/28 via EMS for weakness and dyspnea.  Patient reports was unable to get off the toilet with associated shortness of breath and generalized weakness.  Patient denies headache, no fever/chills, no nausea/vomiting/diarrhea, no palpitations, no diaphoresis, no abdominal pain.   Patient recently hospitalized at [facility] long hospital from  5/26 through 5/28 for decompensated OSA/OHS.  During that hospitalization, his BiPAP settings were optimized with a new facemask.  Patient also reports during the hospitalization he had very little mobility which he believes caused deconditioning when he returned home.   In the ED, temperature 99.2 F, HR 89, RR 29, BP 92/52, SPO2 97% on 3 L nasal cannula. Sodium 137, potassium 3.9, chloride 94, CO2 36, glucose 127, BUN 32, creatinine 1.67. WBC 10.6 (was 6.6 on 5/28 at 0127am), hemoglobin 12.2, platelets 131.  COVID-19 PCR negative.  Influenza A/B PCR negative.  Urinalysis with large leukocytes, negative nitrite, rare bacteria, greater than 50 WBCs.  VBG with pH 7.49, PaCO2 47.5, PaO2 56.  Chest x-ray with slight increase in right basilar airspace opacity compared to prior.  Blood cultures x2 ordered. Patient was started on cefepime and vancomycin in the ED for right lower lobe pneumonia. TRH consulted for further evaluation and management of pneumonia and progressive weakness.    Assessment & Plan:   Principal Problem:   Acute  respiratory failure with hypoxia (HCC) Active Problems:   HYPERCHOLESTEROLEMIA   Obesity, Class III, BMI 40-49.9 (morbid obesity) (HCC)   Macrocytic anemia   Obstructive sleep apnea   COPD mixed type (HCC)   Essential hypertension   CKD stage 3 secondary to diabetes (HCC)   Obesity hypoventilation syndrome (HCC)   Acute on chronic respiratory failure with hypoxemia (HCC)   Cervical stenosis of spine   Spinal cord myoclonus   Acute on chronic hypoxic respiratory failure, POA Community-acquired pneumonia Patient presenting to [facility] ED with complaints of productive cough with yellow sputum.  Chest x-ray shows progressive right basilar airspace opacity.  WBC elevated 10.6, up from 6.6 on discharge earlier in the day.  Lactic acid 1.4, procalcitonin elevated 0.44.  Patient was initially started on IV vancomycin, MRSA PCR was negative and was subsequently discontinued.  Patient completed 7-day course of IV cefepime. --Continue supplemental oxygen, maintain SPO2 greater than 88%, currently on 3L [place] (baseline requirement is 3L qHS w/ CPAP) but will likely need continuous oxygen on discharge.  Pleural effusion CT chest without contrast with moderate right, small left pleural effusions with associated L ectasis and consolidation.  IR was consulted and patient underwent thoracentesis with 700 mL of serosanguineous fluid removed.  Pleural fluid cultures negative.  Cytology negative for malignant cells.   Acute renal failure on CKD stage IIIa Baseline creatinine 1.1-1.2.  On discharge on 02/14/2021 was 1.17 and presented with a creatinine of 1.65.  Etiology likely secondary to prerenal azotemia in the setting of furosemide use versus ATN with borderline hypotension. --Cr 1.67>>1.37>>1.68>1.92>1.73>1.46 --Continue to hold home furosemide --Avoid nephrotoxins, renally dose all medications --BMP daily  Hypokalemia: Resolved Repleted --Repeat electrolytes in  a.m. to include magnesium   COPD,  not in acute exacerbation Continue home Trelegy Ellipta 1 puff daily --Continue supplemental oxygen as above   Obstructive sleep apnea Obesity hypoventilation syndrome Recently discharged by pulmonary critical care medicine on 5/28 with adjustments in his home Bipap settings.  Also fitted with a new mask. --Continue nocturnal Bipap with 3 L O2    Chronic diastolic congestive heart failure, compensated On furosemide 20 mg p.o. twice daily at home.  Borderline hypotensive and notable for renal insufficiency on admission.  Chest x-ray with no pulmonary edema. --continue to hold furosemide with increased creatinine --Strict I's and O's Daily weights   Dyslipidemia: Continue atorvastatin 80 mg p.o. daily   Peripheral neuropathy --Gabapentin 300 mg p.o. twice daily   History of gout: Allopurinol 100 mg p.o. daily   Morbid obesity Body mass index is 48.97 kg/m.  Discussed with patient needs for aggressive lifestyle changes/weight loss as this complicates all facets of care.  Outpatient follow-up with PCP.  May benefit from bariatric evaluation outpatient.   Weakness/deconditioning/debility: Patient reports significant deconditioning since recent hospitalization.  Utilizes a walker at baseline.  Social work unable to find a suitable SNF for placement due to his use of a trilogy BiPAP nocturnally.  Has been approved by his insurance, Dr. Erling Cruz after peer to peer review by myself on 03/02/2021 for CIR.  Awaiting bed availability.  Physiologic tremor likely secondary to spinal myoclonus CT head with partially calcified meningioma posterior frontal falx measuring 2.6 x 2.3 x 2.4 cm, mass-effect upon medial frontal lobe with mild brain edema, not significantly changed from last year.  No acute stroke.  Patient unable to tolerate MR given his morbid obesity and unable to lay flat due to his body habitus.  EEG with mild diffuse encephalopathy of nonspecific etiology, no seizures or epileptiform  discharges noted.  Seen by neurology with patient known history of segmental spinal stenosis and cervical spine disease and this is likely related to spinal myoclonus; but given no recent acute exacerbation of chronic neck pain and patient unable to tolerate MRI due to pain and dyspnea while lying flat, recommended outpatient follow-up with neurology for arrangement of open MRI.  Diarrhea Episode of diarrhea overnight.  Patient relates this to something he ate yesterday.  Patient is afebrile.  Not on antibiotics. --Imodium as needed   DVT prophylaxis:  apixaban (ELIQUIS) tablet 5 mg    Code Status: Full Code Family Communication: No family present at bedside this morning  Disposition Plan:  Level of care: Telemetry Medical Status is: Inpatient  Remains inpatient appropriate because:Unsafe d/c plan   Dispo:  Patient From: Home  Planned Disposition: Inpatient Rehab  Medically stable for discharge: Yes      Consultants:  Neurology Interventional radiology  Procedures:  Right thoracentesis, IR 02/18/2021  Antimicrobials:  Vancomycin 5/29 - 5/29 Cefepime 5/29 - 6/4    Subjective: Patient seen examined bedside, resting comfortably.  No complaints this morning.  Received call from patient's insurance company requesting peer to peer review in regards to CIR placement; which was approved after call this morning.  Patient was very tearful/emotional this morning; and his goal is just to get stronger and better so he can to return home.  No family present at bedside this morning.  Patient denies headache, no visual changes, no chest pain, no palpitations, no shortness of breath more than his typical baseline, no abdominal pain, no weakness, no fatigue, no paresthesias.  No acute events overnight per nursing staff.  Awaiting bed availability at [facility], likely tomorrow.  Objective: Vitals:   03/01/21 2004 03/01/21 2129 03/02/21 0825 03/02/21 0905  BP: (!) 121/56  (!) 116/98   Pulse: 60 70  81   Resp: 16 20 20    Temp: 98 F (36.7 C)     TempSrc: Oral  Oral   SpO2: 94% 94% 91% 93%  Weight:      Height:        Intake/Output Summary (Last 24 hours) at 03/02/2021 1023 Last data filed at 03/02/2021 0263 Gross per 24 hour  Intake --  Output 500 ml  Net -500 ml   Filed Weights   02/21/21 0536 02/24/21 0431 02/25/21 0444  Weight: (!) 174.7 kg (!) 173.8 kg (!) 171.8 kg    Examination:  General exam: Appears calm and comfortable, obese Respiratory system: Breath sounds slightly decreased bilateral bases, no wheezes/crackles, normal respiratory effort on 3 L nasal cannula Cardiovascular system: S1 & S2 heard, RRR. No JVD, murmurs, rubs, gallops or clicks.  2+ pitting edema bilateral lower extremities to mid shin. Gastrointestinal system: Abdomen is nondistended, soft and nontender. No organomegaly or masses felt. Normal bowel sounds heard. Central nervous system: Alert and oriented. No focal neurological deficits. Extremities: Symmetric 5 x 5 power. Skin: Chronic venous changes bilateral lower extremities with cracking of skin, otherwise no other concerning rashes, lesions or ulcers Psychiatry: Judgement and insight appear normal. Mood & affect appropriate.       Data Reviewed: I have personally reviewed following labs and imaging studies  CBC: No results for input(s): WBC, NEUTROABS, HGB, HCT, MCV, PLT in the last 168 hours.  Basic Metabolic Panel: Recent Labs  Lab 02/24/21 0321 02/25/21 0305 02/26/21 0323 02/28/21 0209  NA 139 141 139 140  K 3.7 3.4* 3.6 3.9  CL 97* 96* 97* 101  CO2 35* 37* 35* 31  GLUCOSE 101* 96 95 87  BUN 37* 39* 33* 25*  CREATININE 1.92* 1.73* 1.46* 1.40*  CALCIUM 8.6* 9.1 9.2 9.0  MG  --   --  2.1 2.1   GFR: Estimated Creatinine Clearance: 72.6 mL/min (A) (by C-G formula based on SCr of 1.4 mg/dL (H)). Liver Function Tests: No results for input(s): AST, ALT, ALKPHOS, BILITOT, PROT, ALBUMIN in the last 168 hours. No results for  input(s): LIPASE, AMYLASE in the last 168 hours. No results for input(s): AMMONIA in the last 168 hours. Coagulation Profile: No results for input(s): INR, PROTIME in the last 168 hours. Cardiac Enzymes: No results for input(s): CKTOTAL, CKMB, CKMBINDEX, TROPONINI in the last 168 hours. BNP (last 3 results) No results for input(s): PROBNP in the last 8760 hours. HbA1C: No results for input(s): HGBA1C in the last 72 hours. CBG: Recent Labs  Lab 02/24/21 2120 02/25/21 2128  GLUCAP 129* 123*   Lipid Profile: No results for input(s): CHOL, HDL, LDLCALC, TRIG, CHOLHDL, LDLDIRECT in the last 72 hours. Thyroid Function Tests: No results for input(s): TSH, T4TOTAL, FREET4, T3FREE, THYROIDAB in the last 72 hours. Anemia Panel: No results for input(s): VITAMINB12, FOLATE, FERRITIN, TIBC, IRON, RETICCTPCT in the last 72 hours. Sepsis Labs: No results for input(s): PROCALCITON, LATICACIDVEN in the last 168 hours.  No results found for this or any previous visit (from the past 240 hour(s)).        Radiology Studies: No results found.       Scheduled Meds:  allopurinol  100 mg Oral Daily   apixaban  5 mg Oral BID   vitamin C  500  mg Oral Daily   atorvastatin  80 mg Oral Daily   famotidine  40 mg Oral Daily   fluticasone furoate-vilanterol  1 puff Inhalation Daily   And   umeclidinium bromide  1 puff Inhalation Daily   gabapentin  300 mg Oral BID   polyethylene glycol  17 g Oral Daily   Continuous Infusions:   LOS: 15 days    Time spent: 34 minutes spent on chart review, discussion with nursing staff, consultants, updating family and interview/physical exam; more than 50% of that time was spent in counseling and/or coordination of care.    Schylar Wuebker J [place] DO [facility] Available via Epic secure chat 7am-7pm After these hours, please refer to coverage provider listed on [email or web address] 03/02/2021, 10:23 AM

## 2021-03-02 NOTE — Progress Notes (Signed)
Inpatient Rehab Admissions Coordinator:   I have insurance auth on this pt. And a bed on CIR for him today. Will plan to admit. RN may call [phone] for report.   Clemens Catholic, [place], [place] Admissions Coordinator  [phone] ([place]) [phone] (office)

## 2021-03-02 NOTE — Discharge Summary (Signed)
Physician Discharge Summary  Gerrick Ray TML:[id number] DOB: Jun 05, 1941 DOA: 02/14/2021  PCP: [name], MD  Admit date: 02/14/2021 Discharge date: 03/02/2021  Admitted From: Home Disposition: [facility]patient rehabilitation  Recommendations for Outpatient Follow-up:  Follow up with PCP in 1-2 weeks Follow-up with neurology 4 weeks for spinal myoclonus Change Lasix to as needed for edema/weight gain Please obtain BMP in one week to assess renal function  Discharge Condition: Stable CODE STATUS: Full code Diet recommendation: Cardiac/low-salt 2 g diet with 1500 mL fluid restriction  History of present illness:  Vasil Juhasz is a 80 year old male with past medical history significant for paroxysmal atrial fibrillation, aortic stenosis s/p TAVR, chronic diastolic congestive heart failure, degenerative disc disease, gout, hyperlipidemia, hypertension, morbid obesity, OSA/hypoventilation syndrome, OA, CKD3a, peripheral neuropathy, CAD, history of prostate cancer, venous stasis dermatitis who presented to [facility] ED on 5/28 via EMS for weakness and dyspnea.  Patient reports was unable to get off the toilet with associated shortness of breath and generalized weakness.  Patient denies headache, no fever/chills, no nausea/vomiting/diarrhea, no palpitations, no diaphoresis, no abdominal pain.   Patient recently hospitalized at [facility] long hospital from  5/26 through 5/28 for decompensated OSA/OHS.  During that hospitalization, his BiPAP settings were optimized with a new facemask.  Patient also reports during the hospitalization he had very little mobility which he believes caused deconditioning when he returned home.   In the ED, temperature 99.2 F, HR 89, RR 29, BP 92/52, SPO2 97% on 3 L nasal cannula. Sodium 137, potassium 3.9, chloride 94, CO2 36, glucose 127, BUN 32, creatinine 1.67. WBC 10.6 (was 6.6 on 5/28 at 0127am), hemoglobin 12.2, platelets 131.  COVID-19 PCR negative.   Influenza A/B PCR negative.  Urinalysis with large leukocytes, negative nitrite, rare bacteria, greater than 50 WBCs.  VBG with pH 7.49, PaCO2 47.5, PaO2 56.  Chest x-ray with slight increase in right basilar airspace opacity compared to prior.  Blood cultures x2 ordered. Patient was started on cefepime and vancomycin in the ED for right lower lobe pneumonia. TRH consulted for further evaluation and management of pneumonia and progressive weakness.  Hospital course:  Acute on chronic hypoxic respiratory failure, POA Community-acquired pneumonia Patient presenting to [facility], ED with complaints of productive cough with yellow sputum.  Chest x-ray shows progressive right basilar airspace opacity.  WBC elevated 10.6, up from 6.6 on discharge earlier in the day.  Lactic acid 1.4, procalcitonin elevated 0.44.  Patient was initially started on IV vancomycin, MRSA PCR was negative and was subsequently discontinued.  Patient completed 7-day course of IV cefepime. Continue supplemental oxygen, maintain SPO2 greater than 88%, currently on 3L [place] (baseline requirement is 3L qHS w/ CPAP) but will need continuous oxygen when ready for discharge back home from inpatient rehabilitation.   Pleural effusion CT chest without contrast with moderate right, small left pleural effusions with associated L ectasis and consolidation.  IR was consulted and patient underwent thoracentesis with 700 mL of serosanguineous fluid removed.  Pleural fluid cultures negative.  Cytology negative for malignant cells.   Acute renal failure on CKD stage IIIa Baseline creatinine 1.1-1.2.  On discharge on 02/14/2021 was 1.17 and presented with a creatinine of 1.65.  Etiology likely secondary to prerenal azotemia in the setting of furosemide use versus ATN with borderline hypotension.  Creatinine 1.46 at time of discharge.  Continue to hold home scheduled to furosemide; now only recommend as needed use.  Continue fluid restriction and monitor  daily  weights.  Recommend repeat BMP 1 week.   Hypokalemia: Resolved Repleted during hospitalization   COPD, not in acute exacerbation Continue home Trelegy Ellipta 1 puff daily. Continue supplemental oxygen as above   Obstructive sleep apnea Obesity hypoventilation syndrome Recently discharged by pulmonary critical care medicine on 5/28 with adjustments in his home Bipap settings.  Also fitted with a new mask. --Continue nocturnal Bipap with 3 L O2   Chronic diastolic congestive heart failure, compensated On furosemide 20 mg p.o. twice daily at home.  Borderline hypotensive and notable for renal insufficiency on admission.  Chest x-ray with no pulmonary edema.  Continue to hold home scheduled furosemide, furosemide 20 mg p.o. as needed for weight gain greater than 3 pounds in 1 day or 5 pounds in 1 week.  Recommend continue to monitor daily weights.  Fluid restriction 1500 mL/day.   Dyslipidemia: Continue atorvastatin 80 mg p.o. daily   Peripheral neuropathy Gabapentin 300 mg p.o. twice daily   History of gout: Allopurinol 100 mg p.o. daily   Morbid obesity Body mass index is 48.97 kg/m.  Discussed with patient needs for aggressive lifestyle changes/weight loss as this complicates all facets of care.  Outpatient follow-up with PCP.  May benefit from bariatric evaluation outpatient.   Weakness/deconditioning/debility: Patient reports significant deconditioning since recent hospitalization.  Utilizes a walker at baseline.  Social work unable to find a suitable SNF for placement due to his use of a trilogy BiPAP nocturnally.  Has been approved by his insurance, Dr. Erling Cruz after peer to peer review by myself on 03/02/2021 for CIR. discharging to CIR today.   Physiologic tremor likely secondary to spinal myoclonus CT head with partially calcified meningioma posterior frontal falx measuring 2.6 x 2.3 x 2.4 cm, mass-effect upon medial frontal lobe with mild brain edema, not significantly  changed from last year.  No acute stroke.  Patient unable to tolerate MR given his morbid obesity and unable to lay flat due to his body habitus.  EEG with mild diffuse encephalopathy of nonspecific etiology, no seizures or epileptiform discharges noted.  Seen by neurology with patient known history of segmental spinal stenosis and cervical spine disease and this is likely related to spinal myoclonus; but given no recent acute exacerbation of chronic neck pain and patient unable to tolerate MRI due to pain and dyspnea while lying flat, recommended outpatient follow-up with neurology for arrangement of open MRI.    Discharge Diagnoses:  Active Problems:   HYPERCHOLESTEROLEMIA   Obesity, Class III, BMI 40-49.9 (morbid obesity) (HCC)   Macrocytic anemia   Obstructive sleep apnea   COPD mixed type (HCC)   Essential hypertension   CKD stage 3 secondary to diabetes ([place])   Obesity hypoventilation syndrome (HCC)   Acute on chronic respiratory failure with hypoxemia (HCC)   Cervical stenosis of spine   Spinal cord myoclonus    Discharge Instructions  Discharge Instructions     Ambulatory referral to Neurology   Complete by: As directed    An appointment is requested in approximately: 4 weeks   Diet - low sodium heart healthy   Complete by: As directed    Fluid restrict 1595mL/day   Increase activity slowly   Complete by: As directed    No wound care   Complete by: As directed       Allergies as of 03/02/2021       Reactions   Sulfa Antibiotics    Other reaction(s): Unknown   Coumadin [warfarin Sodium] Other (See Comments)  States he can't be on this-bleeds out        Medication List     STOP taking these medications    cetirizine 10 MG tablet Commonly known as: ZYRTEC   Hematinic/Folic Acid 409-8 MG Tabs Generic drug: Ferrous Fumarate-Folic Acid       TAKE these medications    acetaminophen 325 MG tablet Commonly known as: Tylenol Take 2 tablets (650 mg total)  by mouth every 4 (four) hours as needed. What changed:  medication strength how much to take when to take this reasons to take this   albuterol 108 (90 Base) MCG/ACT inhaler Commonly known as: VENTOLIN HFA TAKE 2 PUFFS BY MOUTH EVERY 6 HOURS AS NEEDED What changed:  how much to take how to take this when to take this reasons to take this additional instructions   allopurinol 100 MG tablet Commonly known as: ZYLOPRIM Take 1 tablet (100 mg total) by mouth daily.   atorvastatin 80 MG tablet Commonly known as: LIPITOR 1 tablet by mouth daily What changed:  how much to take how to take this when to take this additional instructions   AZO-STANDARD PO Take 1 tablet by mouth 2 (two) times daily.   CENTRUM SILVER PO Take 1 tablet by mouth daily.   CoQ10 100 MG Caps Take 100 mg by mouth daily.   Eliquis 5 MG Tabs tablet Generic drug: apixaban TAKE 1 TABLET BY MOUTH TWICE A DAY What changed: how much to take   famotidine 40 MG tablet Commonly known as: PEPCID TAKE 1 TABLET BY MOUTH EVERY DAY   furosemide 20 MG tablet Commonly known as: LASIX Take 1 tablet (20 mg total) by mouth daily as needed for fluid or edema (for weight gain >3 lbs in one day or 5 lbs in one week). What changed:  when to take this reasons to take this   gabapentin 300 MG capsule Commonly known as: NEURONTIN Take 1 capsule (300 mg total) by mouth 3 (three) times daily. What changed:  how much to take when to take this   polyethylene glycol 17 g packet Commonly known as: MIRALAX / GLYCOLAX Take 17 g by mouth daily as needed. What changed:  when to take this reasons to take this   TART CHERRY ADVANCED PO Take 1,200 mg by mouth 2 (two) times a day.   Trelegy Ellipta 100-62.5-25 MCG/INH Aepb Generic drug: Fluticasone-Umeclidin-Vilant Inhale 1 puff into the lungs daily.   vitamin C 500 MG tablet Commonly known as: ASCORBIC ACID Take 500 mg by mouth daily.   Vitamin D3 125 MCG (5000  UT) Caps Take 1,000 Units by mouth daily.        Follow-up Information     [name], MD. Schedule an appointment as soon as possible for a visit in 1 week(s).   Specialty: Family Medicine Contact information: [address] [place] [address] [phone]         Constance Haw, MD .   Specialty: Cardiology Contact information: [address] [place] [address] [phone]         Josue Hector, MD .   Specialty: Cardiology Contact information: [phone] [place] [address] [phone]                Allergies  Allergen Reactions   Sulfa Antibiotics     Other reaction(s): Unknown   Coumadin [Warfarin Sodium] Other (See Comments)  States he can't be on this-bleeds out    Consultations: Neurology Interventional radiology   Procedures/Studies: DG Chest 1 View  Result Date: 02/18/2021 CLINICAL DATA:  Status post thoracentesis EXAM: CHEST  1 VIEW COMPARISON:  Feb 17, 2021 chest radiograph and chest CT FINDINGS: No pneumothorax. Small residual pleural effusion on the right. No edema or airspace opacity. Stable cardiomegaly with pacemaker leads attached to right atrium and right ventricle. There is aortic atherosclerosis. Mitral annulus calcification noted. Status post aortic valve replacement. No adenopathy. Degenerative change in each shoulder noted. IMPRESSION: No pneumothorax. Small residual pleural effusion on the right. No edema or airspace opacity. Stable cardiac silhouette with postoperative changes. Aortic Atherosclerosis (ICD10-I70.0). Electronically Signed   By: Lowella Grip III M.D.   On: 02/18/2021 10:48   CT HEAD WO CONTRAST  Result Date: 02/22/2021 CLINICAL DATA:  Mental status changes of unknown cause. EXAM: CT HEAD WITHOUT CONTRAST TECHNIQUE: Contiguous axial images were obtained from the base of the skull through the vertex without intravenous contrast. COMPARISON:   04/15/2020 FINDINGS: Brain: The brainstem and cerebellum are normal. Again demonstrated is a partially calcified meningioma primarily along the left side of the falx in the posterior frontal region measuring 2.6 x 2.3 x 2.4 cm. Mild extension to the right side of the falx. Some mass-effect upon the medial left frontal lobe, with mild adjacent edema. No sign of acute infarction, intra-axial mass lesion, hemorrhage, hydrocephalus or extra-axial collection. Vascular: There is atherosclerotic calcification of the major vessels at the base of the brain. Skull: Negative Sinuses/Orbits: Clear/normal Other: None IMPRESSION: No sign of acute infarction or intracranial hemorrhage. Partially calcified meningioma the posterior frontal falx, more to the left side than the right, measuring 2.6 x 2.3 x 2.4 cm, a couple of mm larger than was seen last year. Mass-effect upon the left medial frontal lobe with mild brain edema. Though this abnormality is notable, I would not expect it to be associated with an acute mental status change. Electronically Signed   By: Nelson Chimes M.D.   On: 02/22/2021 18:30   CT CHEST WO CONTRAST  Result Date: 02/17/2021 CLINICAL DATA:  Pneumonia, worsening shortness of breath, no prove mint with the IV antibiotics EXAM: CT CHEST WITHOUT CONTRAST TECHNIQUE: Multidetector CT imaging of the chest was performed following the standard protocol without IV contrast. COMPARISON:  None. FINDINGS: Cardiovascular: Left chest multi lead pacer. Aortic atherosclerosis. Aortic valve stent endograft. Cardiomegaly. Dense mitral annular calcifications. Trace pericardial effusion. Mediastinum/Nodes: No enlarged mediastinal, hilar, or axillary lymph nodes. Thyroid gland, trachea, and esophagus demonstrate no significant findings. Lungs/Pleura: Moderate right, small left pleural effusions and associated atelectasis or consolidation. Mild interlobular septal thickening throughout the lungs. Upper Abdomen: No acute  abnormality. Musculoskeletal: No chest wall mass or suspicious bone lesions identified. IMPRESSION: 1. Moderate right, small left pleural effusions and associated atelectasis or consolidation. 2. Mild interlobular septal thickening throughout the lungs, consistent with mild pulmonary edema. 3. Cardiomegaly and trace pericardial effusion. 4. Aortic valve stent endograft. Aortic Atherosclerosis (ICD10-I70.0). Electronically Signed   By: Eddie Candle M.D.   On: 02/17/2021 13:29   DG CHEST PORT 1 VIEW  Result Date: 02/21/2021 CLINICAL DATA:  Short of breath. Paracentesis today. Concern for pneumothorax EXAM: PORTABLE CHEST 1 VIEW COMPARISON:  02/18/2021 FINDINGS: Stable enlarged cardiac silhouette. There is moderate RIGHT effusion unchanged. No pneumothorax. Pacer noted. IMPRESSION: No pneumothorax. Cardiomegaly, low lung volumes, and pleural effusion. Electronically Signed   By: Suzy Bouchard M.D.   On: 02/21/2021 09:12  DG CHEST PORT 1 VIEW  Result Date: 02/17/2021 CLINICAL DATA:  Atrial fibrillation. Weakness and shortness of breath. EXAM: PORTABLE CHEST 1 VIEW COMPARISON:  02/14/2021 FINDINGS: A dual lead pacemaker remains in place. The cardiac silhouette remains mildly enlarged. Lung volumes remain low with similar appearance of bibasilar airspace opacities but slight increase of mild right midlung opacity. A small right pleural effusion is not excluded. No pneumothorax is seen. IMPRESSION: Low lung volumes with unchanged bibasilar airspace opacities and slightly increased right midlung opacity. Findings could reflect atelectasis or pneumonia. Electronically Signed   By: Logan Bores M.D.   On: 02/17/2021 10:22   DG Chest Portable 1 View  Result Date: 02/14/2021 CLINICAL DATA:  Weakness and shortness of breath EXAM: PORTABLE CHEST 1 VIEW COMPARISON:  02/12/2021 FINDINGS: Cardiac shadow is enlarged but stable. Pacing device is again seen. Slight increase in the degree of right basilar airspace opacity  when compare with the prior study. No other focal abnormality is noted. IMPRESSION: Slight increase in the degree of right basilar airspace opacity when compared with the prior study. Electronically Signed   By: Inez Catalina M.D.   On: 02/14/2021 20:08   DG Chest Port 1 View  Result Date: 02/12/2021 CLINICAL DATA:  Question sepsis. EXAM: PORTABLE CHEST 1 VIEW COMPARISON:  05/08/2020 FINDINGS: Cardiac enlargement. Dual lead pacemaker. No significant edema. TAVR. Right lower lobe airspace density is unchanged from the prior study and likely is atelectasis or scarring. Possible small right pleural effusion. Negative for edema or pneumonia IMPRESSION: Mild right lower lobe airspace density unchanged from the prior study. No superimposed acute abnormality. Electronically Signed   By: Franchot Gallo M.D.   On: 02/12/2021 13:25   EEG adult  Result Date: 02/23/2021 Lora Havens, MD     02/23/2021  1:49 PM Patient Name: Dre Gamino MRN: [id number] Epilepsy Attending: Lora Havens Referring Physician/Provider: Dr Hosie Poisson Date: 02/23/2021 Duration: 23.18 mins Patient history: 80 year old male with meningioma, altered mental status.  EEG done for seizures. Level of alertness: Awake AEDs during EEG study: Gabapentin Technical aspects: This EEG study was done with scalp electrodes positioned according to the 10-20 International system of electrode placement. Electrical activity was acquired at a sampling rate of 500Hz  and reviewed with a high frequency filter of 70Hz  and a low frequency filter of 1Hz . EEG data were recorded continuously and digitally stored. Description: The posterior dominant rhythm consists of 8 Hz activity of moderate voltage (25-35 uV) seen predominantly in posterior head regions, symmetric and reactive to eye opening and eye closing. EEG showed continuous generalized 5 to 7 Hz theta as well as intermittent generalized 2 to 3 Hz delta slowing. Hyperventilation and photic stimulation were  not performed.   ABNORMALITY - Continuous slow, generalized IMPRESSION: This study is suggestive of mild diffuse encephalopathy, nonspecific etiology. No seizures or epileptiform discharges were seen throughout the recording. Lora Havens   ECHOCARDIOGRAM COMPLETE  Result Date: 02/13/2021    ECHOCARDIOGRAM REPORT   Patient Name:   NAZARETH KIRK Date of Exam: 02/13/2021 Medical Rec #:  [id number]     Height:       74.0 in Accession #:    [id number]    Weight:       389.1 lb Date of Birth:  05/08/1941    BSA:          2.884 m Patient Age:    29 years      BP:  126/58 mmHg Patient Gender: M             HR:           56 bpm. Exam Location:  Inpatient Procedure: 2D Echo, Cardiac Doppler and Color Doppler Indications:    Respiratory distress  History:        Patient has prior history of Echocardiogram examinations, most                 recent 05/09/2020. Risk Factors:Dyslipidemia and Hypertension.  Sonographer:    Cammy Brochure Referring Phys: [id number]  1. Severe calcific MS is present. MG 16 mmHG @ 77 bpm. MVA 1.02 cm2, DI 0.35. The mitral valve is degenerative. No evidence of mitral valve regurgitation. Severe mitral stenosis. Severe mitral annular calcification.  2. Left ventricular ejection fraction, by estimation, is >75%. The left ventricle has hyperdynamic function. The left ventricle has no regional wall motion abnormalities. There is moderate concentric left ventricular hypertrophy. Left ventricular diastolic function could not be evaluated. There is the interventricular septum is flattened in systole and diastole, consistent with right ventricular pressure and volume overload.  3. Right ventricular systolic function is mildly reduced. The right ventricular size is moderately enlarged. There is mildly elevated pulmonary artery systolic pressure. The estimated right ventricular systolic pressure is 26.7 mmHg.  4. Left atrial size was moderately dilated.  5. Right atrial  size was mildly dilated.  6. 26 mm S3. Vmax 2.2 m/s, MG 10 mmHG, EOA 2.0 cm2. No regurgitation. Normal prosthesis . The aortic valve has been repaired/replaced. Aortic valve regurgitation is not visualized. Procedure Date: 2016. Echo findings are consistent with normal structure  and function of the aortic valve prosthesis.  7. The inferior vena cava is normal in size with greater than 50% respiratory variability, suggesting right atrial pressure of 3 mmHg. Comparison(s): No significant change from prior study. FINDINGS  Left Ventricle: Left ventricular ejection fraction, by estimation, is >75%. The left ventricle has hyperdynamic function. The left ventricle has no regional wall motion abnormalities. The left ventricular internal cavity size was normal in size. There is moderate concentric left ventricular hypertrophy. The interventricular septum is flattened in systole and diastole, consistent with right ventricular pressure and volume overload. Left ventricular diastolic function could not be evaluated due to mitral annular calcification (moderate or greater). Left ventricular diastolic function could not be evaluated. Right Ventricle: The right ventricular size is moderately enlarged. No increase in right ventricular wall thickness. Right ventricular systolic function is mildly reduced. There is mildly elevated pulmonary artery systolic pressure. The tricuspid regurgitant velocity is 2.90 m/s, and with an assumed right atrial pressure of 3 mmHg, the estimated right ventricular systolic pressure is 12.4 mmHg. Left Atrium: Left atrial size was moderately dilated. Right Atrium: Right atrial size was mildly dilated. Pericardium: Trivial pericardial effusion is present. Mitral Valve: Severe calcific MS is present. MG 16 mmHG @ 77 bpm. MVA 1.02 cm2, DI 0.35. The mitral valve is degenerative in appearance. Severe mitral annular calcification. No evidence of mitral valve regurgitation. Severe mitral valve stenosis. MV  peak  gradient, 34.3 mmHg. The mean mitral valve gradient is 16.0 mmHg with average heart rate of 77 bpm. Tricuspid Valve: The tricuspid valve is grossly normal. Tricuspid valve regurgitation is mild . No evidence of tricuspid stenosis. Aortic Valve: 26 mm S3. Vmax 2.2 m/s, MG 10 mmHG, EOA 2.0 cm2. No regurgitation. Normal prosthesis. The aortic valve has been repaired/replaced. Aortic valve regurgitation is not visualized.  Aortic valve mean gradient measures 10.0 mmHg. Aortic valve peak gradient measures 19.4 mmHg. Aortic valve area, by VTI measures 2.00 cm. There is a 26 mm Sapien prosthetic, stented (TAVR) valve present in the aortic position. Echo findings are consistent with normal structure and function of the aortic valve prosthesis. Pulmonic Valve: The pulmonic valve was grossly normal. Pulmonic valve regurgitation is not visualized. No evidence of pulmonic stenosis. Aorta: The aortic root is normal in size and structure. Venous: The inferior vena cava is normal in size with greater than 50% respiratory variability, suggesting right atrial pressure of 3 mmHg. IAS/Shunts: The atrial septum is grossly normal. Additional Comments: A device lead is visualized in the right atrium and right ventricle.  LEFT VENTRICLE PLAX 2D LVIDd:         5.40 cm  Diastology LVIDs:         3.40 cm  LV e' lateral: 4.68 cm/s LV PW:         1.50 cm LV IVS:        1.50 cm LVOT diam:     1.90 cm LV SV:         84 LV SV Index:   29 LVOT Area:     2.84 cm  RIGHT VENTRICLE             IVC RV Basal diam:  5.30 cm     IVC diam: 2.10 cm RV S prime:     13.50 cm/s TAPSE (M-mode): 1.9 cm LEFT ATRIUM              Index       RIGHT ATRIUM           Index LA diam:        5.00 cm  1.73 cm/m  RA Area:     26.10 cm LA Vol (A2C):   129.0 ml 44.73 ml/m RA Volume:   86.30 ml  29.92 ml/m LA Vol (A4C):   132.0 ml 45.77 ml/m LA Biplane Vol: 133.0 ml 46.12 ml/m  AORTIC VALVE AV Area (Vmax):    2.23 cm AV Area (Vmean):   1.99 cm AV Area (VTI):      2.00 cm AV Vmax:           220.00 cm/s AV Vmean:          147.000 cm/s AV VTI:            0.420 m AV Peak Grad:      19.4 mmHg AV Mean Grad:      10.0 mmHg LVOT Vmax:         173.00 cm/s LVOT Vmean:        103.000 cm/s LVOT VTI:          0.296 m LVOT/AV VTI ratio: 0.70  AORTA Ao Root diam: 2.60 cm MITRAL VALVE             TRICUSPID VALVE MV Area VTI:  1.02 cm   TR Peak grad:   33.6 mmHg MV Peak grad: 34.3 mmHg  TR Vmax:        290.00 cm/s MV Mean grad: 16.0 mmHg MV Vmax:      2.93 m/s   SHUNTS MV Vmean:     184.0 cm/s Systemic VTI:  0.30 m MV VTI:       0.82 m     Systemic Diam: 1.90 cm Eleonore Chiquito MD Electronically signed by Eleonore Chiquito MD Signature Date/Time: 02/13/2021/3:23:21 PM    Final  CUP PACEART REMOTE DEVICE CHECK  Result Date: 02/03/2021 Scheduled remote reviewed. Normal device function.   Next remote 91 days- JBox, RN/CVRS  IR THORACENTESIS ASP PLEURAL SPACE W/IMG GUIDE  Result Date: 02/18/2021 INDICATION: Shortness of breath. Pneumonia. Right-sided pleural effusion. Request diagnostic and therapeutic thoracentesis. EXAM: ULTRASOUND GUIDED RIGHT THORACENTESIS MEDICATIONS: 1% plain lidocaine, 8 mL COMPLICATIONS: None immediate. PROCEDURE: An ultrasound guided thoracentesis was thoroughly discussed with the patient and questions answered. The benefits, risks, alternatives and complications were also discussed. The patient understands and wishes to proceed with the procedure. Written consent was obtained. Ultrasound was performed to localize and mark an adequate pocket of fluid in the right chest. The area was then prepped and draped in the normal sterile fashion. 1% Lidocaine was used for local anesthesia. Under ultrasound guidance a 6 Fr Safe-T-Centesis catheter was introduced. Thoracentesis was performed. The catheter was removed and a dressing applied. FINDINGS: A total of approximately 700 mL of serosanguineous pleural fluid was removed. Samples were sent to the laboratory as requested  by the clinical team. IMPRESSION: Successful ultrasound guided right thoracentesis yielding 700 mL of pleural fluid. Read by: Ascencion Dike PA-C Electronically Signed   By: Jacqulynn Cadet M.D.   On: 02/18/2021 11:06     Subjective: Patient seen and examined bedside, resting comfortably.  Tearful this morning regarding need for peer to peer review, which was subsequently approved.  Patient has bed available at [facility] today and will discharge for further rehabilitation.  No other questions or concerns at this time.  Denies headache, no fever/chills/night sweats, no nausea/vomiting/diarrhea, no chest pain, no palpitations, no abdominal pain, no cough/congestion.  No acute events overnight per nursing staff.  Discharge Exam: Vitals:   03/02/21 0825 03/02/21 0905  BP: (!) 116/98   Pulse: 81   Resp: 20   Temp:    SpO2: 91% 93%   Vitals:   03/01/21 2004 03/01/21 2129 03/02/21 0825 03/02/21 0905  BP: (!) 121/56  (!) 116/98   Pulse: 60 70 81   Resp: 16 20 20    Temp: 98 F (36.7 C)     TempSrc: Oral  Oral   SpO2: 94% 94% 91% 93%  Weight:      Height:        General: Pt is alert, awake, not in acute distress, obese, chronically ill appearance Cardiovascular: RRR, S1/S2 +, no rubs, no gallops Respiratory: CTA bilaterally, no wheezing, no rhonchi, on 3 L nasal cannula with SPO2 94% at rest Abdominal: Soft, NT, ND, bowel sounds + Extremities: 1+ pitting edema bilateral lower extremities to mid shin no cyanosis Integumentary: Chronic venous changes bilateral lower extremities with cracking of skin    The results of significant diagnostics from this hospitalization (including imaging, microbiology, ancillary and laboratory) are listed below for reference.     Microbiology: No results found for this or any previous visit (from the past 240 hour(s)).   Labs: BNP (last 3 results) Recent Labs    04/16/20 1428 02/12/21 1220 02/14/21 1943  BNP 557.9* 797.2* 258.5*   Basic Metabolic  Panel: Recent Labs  Lab 02/24/21 0321 02/25/21 0305 02/26/21 0323 02/28/21 0209  NA 139 141 139 140  K 3.7 3.4* 3.6 3.9  CL 97* 96* 97* 101  CO2 35* 37* 35* 31  GLUCOSE 101* 96 95 87  BUN 37* 39* 33* 25*  CREATININE 1.92* 1.73* 1.46* 1.40*  CALCIUM 8.6* 9.1 9.2 9.0  MG  --   --  2.1 2.1   Liver Function  Tests: No results for input(s): AST, ALT, ALKPHOS, BILITOT, PROT, ALBUMIN in the last 168 hours. No results for input(s): LIPASE, AMYLASE in the last 168 hours. No results for input(s): AMMONIA in the last 168 hours. CBC: No results for input(s): WBC, NEUTROABS, HGB, HCT, MCV, PLT in the last 168 hours. Cardiac Enzymes: No results for input(s): CKTOTAL, CKMB, CKMBINDEX, TROPONINI in the last 168 hours. BNP: Invalid input(s): POCBNP CBG: Recent Labs  Lab 02/24/21 2120 02/25/21 2128  GLUCAP 129* 123*   D-Dimer No results for input(s): DDIMER in the last 72 hours. Hgb A1c No results for input(s): HGBA1C in the last 72 hours. Lipid Profile No results for input(s): CHOL, HDL, LDLCALC, TRIG, CHOLHDL, LDLDIRECT in the last 72 hours. Thyroid function studies No results for input(s): TSH, T4TOTAL, T3FREE, THYROIDAB in the last 72 hours.  Invalid input(s): FREET3 Anemia work up No results for input(s): VITAMINB12, FOLATE, FERRITIN, TIBC, IRON, RETICCTPCT in the last 72 hours. Urinalysis    Component Value Date/Time   COLORURINE AMBER (A) 02/15/2021 0357   APPEARANCEUR CLOUDY (A) 02/15/2021 0357   LABSPEC 1.017 02/15/2021 0357   PHURINE 8.0 02/15/2021 0357   GLUCOSEU NEGATIVE 02/15/2021 0357   GLUCOSEU NEGATIVE 03/19/2019 1128   HGBUR SMALL (A) 02/15/2021 0357   BILIRUBINUR NEGATIVE 02/15/2021 0357   BILIRUBINUR negative 11/28/2018 1142   BILIRUBINUR negative 04/12/2018 [place] 02/15/2021 0357   PROTEINUR 100 (A) 02/15/2021 0357   UROBILINOGEN 0.2 03/19/2019 1128   NITRITE NEGATIVE 02/15/2021 0357   LEUKOCYTESUR LARGE (A) 02/15/2021 0357   Sepsis  Labs Invalid input(s): PROCALCITONIN,  WBC,  LACTICIDVEN Microbiology No results found for this or any previous visit (from the past 240 hour(s)).   Time coordinating discharge: Over 30 minutes  SIGNED:   Donnamarie Poag [place] DO  [facility] 03/02/2021, 12:19 PM

## 2021-03-02 NOTE — Progress Notes (Signed)
Occupational Therapy Treatment Patient Details Name: [name] MRN: [id number] DOB: 02-16-1941 Today's Date: 03/02/2021    History of present illness Pt is a 80 y.o. male recently admitted to [facility] (02/12/21-02/14/21) with acute on chronic respiratory failure, now readmitted 02/14/21 due to weakness and SOB, unable to stand up from toilet requiring EMS call. Found to be hypoxic. Workup for acute on chronic hypoxic respiratory failure, likely combination of acute on chronic dHF and PNA. Chest CT with L pleural effusions; s/p thoracentesis. 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 PMH includes OSA, HTN, obesity, afib, CHF, venous stasis dermatitis.   OT comments  Pt making steady progress towards OT goals this session. Pt continues to present with generalized deconditioning, impaired balance and decreased activity tolerance. Pt currently requires MOD - MAX A +2 for bed mobility and MIN A +2 for sit<>stands with EOB with pt preferring to stand from elevated EOB ( to simulate home environment) and pull up on RW during sit<>stand trials. Attempted heel toe pivot pattern however pt unable to maneuver feet in standing. Pt with SpO2 turned up to 5L upon arrival, however [place] disconnected with pt actually on RA, left pt on 4L [place] during session with SpO2 >89% during session. Pt would continue to benefit from skilled occupational therapy while admitted and after d/c to address the below listed limitations in order to improve overall functional mobility and facilitate independence with BADL participation. DC plan remains appropriate, will follow acutely per POC.     Follow Up Recommendations  CIR    Equipment Recommendations  None recommended by OT    Recommendations for Other Services      Precautions / Restrictions Precautions Precautions: Fall;Other (comment) Precaution Comments: Watch SpO2 Required Braces or Orthoses: Other Brace Other  Brace: Has bilateral knee braces/compression wrap (for stability); left knee hinged brace then places elastic sleeve over brace; rt knee prefers ace wrap tightly for support Restrictions Weight Bearing Restrictions: No       Mobility Bed Mobility Overal bed mobility: Needs Assistance Bed Mobility: Supine to Sit;Sit to Supine     Supine to sit: Mod assist;HOB elevated;+2 for physical assistance Sit to supine: Max assist;+2 for physical assistance   General bed mobility comments: pt able to maneuver BLES to EOB with increased time and effort, cues needed to reach RUE over to bed rail, most physcial assist needed to scoot hips to EOB with MOD A +2. MAX A +2 to fully return to supine, pt able to descend onto pts LUE but needed MAX A +2 to elevate BLEs and lower trunk back down to bed    Transfers Overall transfer level: Needs assistance Equipment used: Bilateral platform walker Transfers: Sit to/from Stand Sit to Stand: Min assist;+2 physical assistance;From elevated surface         General transfer comment: Bilateral knee braces/wraps and shoes donned prior to standing; bed elevated per pt request to simulate bed height at home; pt insistent on pulling up on RW, MIN A +2 to power up in standing. completed sit<>stand x2 trials. on second trial attempted lateral weight shifting with and heel toe scooting with minimal success, pt reports that he needs to look down at his feet during standing making pt present with flexed posture. pt denies tingling or numbness in feet but insistent that he needs to be able to see his feet    Balance Overall balance assessment: Needs assistance Sitting-balance support: Feet supported Sitting balance-Leahy  Scale: Fair     Standing balance support: Bilateral upper extremity supported Standing balance-Leahy Scale: Poor Standing balance comment: Heavy reliance on BUE support as well as BLEs locked in extension                           ADL  either performed or assessed with clinical judgement   ADL                       Lower Body Dressing: Total assistance;Bed level Lower Body Dressing Details (indicate cue type and reason): total  A to don <>doff braces from long sitting             Functional mobility during ADLs: Maximal assistance;+2 for physical assistance;+2 for safety/equipment;Minimal assistance (MAX A-MODA +2 for bed mobility ,MIN A +2 for sit<>stand) General ADL Comments: session focus on functional sit<>stands from heavily elevated EOB     Vision       Perception     Praxis      Cognition Arousal/Alertness: Awake/alert Behavior During Therapy: WFL for tasks assessed/performed Overall Cognitive Status: Within Functional Limits for tasks assessed                                 General Comments: Verbose with conversation requiring redirection to task, perseverating on not having the same set- up at home        Exercises Other Exercises Other Exercises: reviewed all BUE therex with pt and provided writen HEP   Shoulder Instructions       General Comments pt on 5L [place] upon arrival however O2 line not connected, left on 4L during session as pt on RA at baseline ( 3L only at night), pts spO2 >89% during session HR increase to as much as 131 bpm during mobility    Pertinent Vitals/ Pain       Pain Assessment: Faces Faces Pain Scale: No hurt  Home Living                                          Prior Functioning/Environment              Frequency  Min 5X/week        Progress Toward Goals  OT Goals(current goals can now be found in the care plan section)  Progress towards OT goals: Progressing toward goals  Acute Rehab OT Goals Patient Stated Goal: to get to rehab OT Goal Formulation: With patient Time For Goal Achievement: 03/17/21 Potential to Achieve Goals: Good  Plan Discharge plan remains appropriate;Frequency remains appropriate     Co-evaluation                 AM-PAC OT "6 Clicks" Daily Activity     Outcome Measure   Help from another person eating meals?: None Help from another person taking care of personal grooming?: A Little Help from another person toileting, which includes using toliet, bedpan, or urinal?: Total Help from another person bathing (including washing, rinsing, drying)?: A Lot Help from another person to put on and taking off regular upper body clothing?: A Lot Help from another person to put on and taking off regular lower body clothing?: Total 6 Click Score: 13    End of Session Equipment  Utilized During Treatment: Gait belt;Rolling walker;Other (comment) (4-5L [place])  OT Visit Diagnosis: Muscle weakness (generalized) (M62.81);Other abnormalities of gait and mobility (R26.89);Unsteadiness on feet (R26.81)   Activity Tolerance Patient tolerated treatment well   Patient Left in bed;with call bell/phone within reach   Nurse Communication Mobility status        Time: 0826-0912 OT Time Calculation (min): 46 min  Charges: OT General Charges $OT Visit: 1 Visit OT Treatments $Therapeutic Activity: 38-52 mins  Harley Alto., COTA/L Acute Rehabilitation Services [phone]    Precious Haws 03/02/2021, 9:57 AM

## 2021-03-02 NOTE — H&P (Signed)
All Collapse All       Physical Medicine and Rehabilitation Admission H&P        Chief Complaint  Patient presents with   Debility   OHS/hypoxia.       HPI: [name] is a 80 year old male with history of PAF, Aortic stenosis s/p TVAR, gout, morbid obesity, CKD, peripheral neuropathy, DDD cervical/lumbar spine, COPD, venous stasis dermatitis, CAD, OSA/OHS --BIPAP w/3 L, recent admission 05/26-05/28/22 for decompensated respiratory failure. He was readmitted early am of 02/15/21 with inability to get off commode, DOE with hypoxia and weakness. He was found to have acute chronic respiratory failure requiring 3 L oxygen per [place].  CXR showed RLL airspace disease. He was treated with 7 day course of IV cefepime for PNA and CT chest done due to ongoing respiratory symptoms. This revealed moderate right and small left pleural effusions as well as mild pulmonary edema.  2D echo showed EF >75% with severe mitral stenosis and moderate concentric LVH with flattening of interventricular septum in systole/diastole. He was treated with dose of lasix for diuresis and underwent thoracocentesis of 700 cc serosanguinous fluid on 06/01 with improvement in symptoms.   On 06/05, patient noted to have worsening of BUE myoclonus jerks  with inability to speak. EEG was negative for seizures and  CT head showed partially calcified meningioma in posterior frontal left>right side of falx with mass effect on medial frontal lobe with  mild edema. Neurology consulted and felt that symptoms due to cervical stenosis --keppra recommended to manage symptoms and follow up with neurology after discharge. Furosemide on hold due to upward trend in SCr. He continues to be limited by SOB with fatigue as well as flexed posture with standing attempts. CIR recommended due to functional decline. Very concerned about his urination.      Review of Systems Constitutional:  Negative for chills and fever. HENT:  Negative for hearing loss.    Eyes:  Negative for blurred vision and double vision. Respiratory:  Negative for shortness of breath and stridor.   Cardiovascular:  Positive for leg swelling. Negative for chest pain. Gastrointestinal:  Positive for diarrhea. Negative for abdominal pain. Genitourinary:  Positive for urgency. Musculoskeletal:  Positive for back pain, falls (in the past--none recently), joint pain and myalgias. Neurological:  Positive for sensory change (burning stinging BLE to knees and bilateral hands.) and weakness. Negative for dizziness and headaches. Psychiatric/Behavioral:  The patient is not nervous/anxious.           Past Medical History:  Diagnosis Date   1st degree AV block 11/09/2018    Noted on EKG   Anemia     Aortic stenosis, severe      S/p Edwards Sapien 3 Transcatheter Heart Valve (size 26 mm, model # [id number], serial # [id number])   Arthritis     Back pain     Bursitis     Cataract      left immature   Cellulitis 10/12/2015   CHF (congestive heart failure) (HCC)     Complication of anesthesia      Halucinations   Constipation      takes Miralax daily as well as Senokot daily   DDD (degenerative disc disease)     Gout      takes Allopurinol daily   Heart valve disorder     History of blood clots 1962    knee   History of blood transfusion      no abnormal reaction noted  History of shingles     Hyperlipidemia      takes Atorvastatin daily   Hypertension      takes Lisinopril daily   Low back pain 01/25/2017   Morbid obesity ([place])     OSA (obstructive sleep apnea)     Osteoarthritis     Peripheral neuropathy      takes Gabapentin daily   Peroneal palsy      significant right foot drop   Persistent atrial fibrillation (HCC)     Pneumonia 25+yrs ago    hx of   Prostate cancer ([place]) 02/13/2019   RBBB 11/09/2018    Noted on EKG   Thrombocytopenia (HCC)     Urinary frequency     Urinary urgency     Valvular heart disease     Venous stasis dermatitis              Past Surgical History:  Procedure Laterality Date   CARDIAC CATHETERIZATION N/A 05/02/2015    Procedure: Right/Left Heart Cath and Coronary Angiography;  Surgeon: Burnell Blanks, MD;  Location: [place] [facility];  Service: Cardiovascular;  Laterality: N/A;   CARDIOVERSION N/A 10/27/2018    Procedure: CARDIOVERSION;  Surgeon: Fay Records, MD;  Location: [facility] ENDOSCOPY;  Service: Cardiovascular;  Laterality: N/A;   COLONOSCOPY N/A 02/07/2013    Procedure: COLONOSCOPY;  Surgeon: Juanita Craver, MD;  Location: [facility] ENDOSCOPY;  Service: Endoscopy;  Laterality: N/A;   COLONOSCOPY N/A 02/09/2013    Procedure: COLONOSCOPY;  Surgeon: Beryle Beams, MD;  Location: [place];  Service: Endoscopy;  Laterality: N/A;   ESOPHAGOGASTRODUODENOSCOPY N/A 02/07/2013    Procedure: ESOPHAGOGASTRODUODENOSCOPY (EGD);  Surgeon: Juanita Craver, MD;  Location: [facility] ENDOSCOPY;  Service: Endoscopy;  Laterality: N/A;   GIVENS CAPSULE STUDY N/A 02/09/2013    Procedure: GIVENS CAPSULE STUDY;  Surgeon: Beryle Beams, MD;  Location: [place];  Service: Endoscopy;  Laterality: N/A;   HERNIA REPAIR        umbilical hernia   IR THORACENTESIS ASP PLEURAL SPACE W/IMG GUIDE   02/18/2021   KNEE SURGERY Right      multiple knee surgeries due to complication of R TKA   LAMINOTOMY   1193    c6-t2   PACEMAKER IMPLANT N/A 04/27/2018    Procedure: PACEMAKER IMPLANT;  Surgeon: Constance Haw, MD;  Location: [place] [facility];  Service: Cardiovascular;  Laterality: N/A;   PROSTATE BIOPSY N/A 04/12/2019    Procedure: BIOPSY TRANSRECTAL ULTRASONIC PROSTATE (TUBP) CYSTOSCOPY;  Surgeon: [name] Hughs, MD;  Location: [facility];  Service: Urology;  Laterality: N/A;   SPINE SURGERY       TEE WITHOUT CARDIOVERSION N/A 03/07/2015    Procedure: TRANSESOPHAGEAL ECHOCARDIOGRAM (TEE);  Surgeon: Josue Hector, MD;  Location: [facility] ENDOSCOPY;  Service: Cardiovascular;  Laterality: N/A;  ANES TO BRING PROPOFOL PER DOCTOR   TEE WITHOUT  CARDIOVERSION N/A 06/10/2015    Procedure: TRANSESOPHAGEAL ECHOCARDIOGRAM (TEE);  Surgeon: Burnell Blanks, MD;  Location: [place];  Service: Open Heart Surgery;  Laterality: N/A;   TOTAL KNEE ARTHROPLASTY        right   x4 left x 1   TRANSCATHETER AORTIC VALVE REPLACEMENT, TRANSFEMORAL Left 06/10/2015    Procedure: TRANSCATHETER AORTIC VALVE REPLACEMENT, TRANSFEMORAL;  Surgeon: Burnell Blanks, MD;  Location: [place];  Service: Open Heart Surgery;  Laterality: Left;           Family History  Problem Relation Age of Onset   Other Father  POSSIBLE HEART ATTACK   Arthritis Sister          "crippling"   Prostate cancer Brother     Arthritis Sister     Breast cancer Neg Hx     Colon cancer Neg Hx        Social History:  Married. Wife has dementia? Has problems with transfers--uses lift chai and uses walker when out of home. His smoking use included pipe. He has never used smokeless tobacco. He reports that he does not drink alcohol and does not use drugs.          Allergies  Allergen Reactions   Sulfa Antibiotics        Other reaction(s): Unknown   Coumadin [Warfarin Sodium] Other (See Comments)      States he can't be on this-bleeds out            Medications Prior to Admission  Medication Sig Dispense Refill   acetaminophen (TYLENOL) 500 MG tablet Take 2,000 mg by mouth 2 (two) times daily.       albuterol (VENTOLIN HFA) 108 (90 Base) MCG/ACT inhaler TAKE 2 PUFFS BY MOUTH EVERY 6 HOURS AS NEEDED 18 each 1   allopurinol (ZYLOPRIM) 100 MG tablet Take 1 tablet (100 mg total) by mouth daily. 90 tablet 3   atorvastatin (LIPITOR) 80 MG tablet 1 tablet by mouth daily 90 tablet 1   cetirizine (ZYRTEC) 10 MG tablet Take 1 tablet (10 mg total) by mouth daily. (Patient not taking: Reported on 02/13/2021) 30 tablet 11   Cholecalciferol (VITAMIN D3) 125 MCG (5000 UT) CAPS Take 1,000 Units by mouth daily.       Coenzyme Q10 (COQ10) 100 MG CAPS Take 100 mg by mouth daily.        ELIQUIS 5 MG TABS tablet TAKE 1 TABLET BY MOUTH TWICE A DAY 60 tablet 6   Ferrous Fumarate-Folic Acid (HEMATINIC/FOLIC ACID) 536-6 MG TABS Take 1 tablet by mouth daily. (Patient not taking: Reported on 02/13/2021) 90 tablet 1   Fluticasone-Umeclidin-Vilant (TRELEGY ELLIPTA) 100-62.5-25 MCG/INH AEPB Inhale 1 puff into the lungs daily. 60 each 12   gabapentin (NEURONTIN) 300 MG capsule Take 1 capsule (300 mg total) by mouth 3 (three) times daily. 270 capsule 1   Misc Natural Products (TART CHERRY ADVANCED PO) Take 1,200 mg by mouth 2 (two) times a day.       Multiple Vitamins-Minerals (CENTRUM SILVER PO) Take 1 tablet by mouth daily.       Phenazopyridine HCl (AZO-STANDARD PO) Take 1 tablet by mouth 2 (two) times daily.       vitamin C (ASCORBIC ACID) 500 MG tablet Take 500 mg by mouth daily.       [DISCONTINUED] furosemide (LASIX) 20 MG tablet Take 1 tablet (20 mg total) by mouth 2 (two) times daily. 180 tablet 1   [DISCONTINUED] polyethylene glycol (MIRALAX / GLYCOLAX) packet Take 17 g by mouth daily.          Drug Regimen Review  Drug regimen was reviewed and remains appropriate with no significant issues identified   Home: Home Living Family/patient expects to be discharged to:: Private residence Living Arrangements: Spouse/significant other, Children Available Help at Discharge: Family, Available 24 hours/day Type of Home: House Home Access: Ramped entrance Home Layout: One level Bathroom Shower/Tub: Other (comment) (sponge bathes) Bathroom Toilet: Handicapped height Bathroom Accessibility: Yes Home Equipment: Transport planner, Environmental consultant - 2 wheels Additional Comments: has a lift chair   Functional History: Prior Function Level of  Independence: Needs assistance Gait / Transfers Assistance Needed: walks with a bilateral PFRW inside his home and uses a power w/c when out in the community ADL's / Homemaking Assistance Needed: wife and son assist with LBD and occasionally help getting  legs into bed. Pt sponge bathes. Comments: Pt states he sits on the toilet to bathe and hasn't gotten in his shower for 4 years. Lift chair at home   Functional Status:  Mobility: Bed Mobility Overal bed mobility: Needs Assistance Bed Mobility: Supine to Sit, Sit to Supine Rolling: Min guard Supine to sit: HOB elevated, Min assist Sit to supine: Max assist, +2 for physical assistance General bed mobility comments: pt able to maneuver BLES to EOB with increased time/effort and min assist, cues needed to reach RUE over to bed rail, pt pushes up on left elbow and pulls up on rail with RUE (no assist to raise torso or scoot hip to EOB. MAX A +2 to fully return to supine, pt able to descend onto pts LUE but needed MAX A +2 to elevate BLEs and lower trunk back down to bed Transfers Overall transfer level: Needs assistance Equipment used: Bilateral platform walker Transfers: Sit to/from Stand Sit to Stand: Min assist, +2 physical assistance, From elevated surface General transfer comment: Bilateral knee braces/wraps donned prior to standing; no shoes/bare feet to simulate how he walks at home (typically no shoes) bed elevated per pt request to simulate bed height at home; pt insistent on pulling up on RW, MIN A +2 to power up in standing. completed sit<>stand x2 trials and unable to fully stand on 3rd atttempt. attempted lateral weight shifting with heel toe scooting with LLE. pt able to minimally move left heel (when previously with shoes or gripper socks has not been able to move at all) Ambulation/Gait General Gait Details: unable this session; pt can take full weight on bil UEs and LLE to allow him to move/advance RLE, however can't do the opposite leg   ADL: ADL Overall ADL's : Needs assistance/impaired Eating/Feeding: Sitting, Set up Eating/Feeding Details (indicate cue type and reason): Pt able to bring a drink to his mouth and take a few sips once placed in front of him Grooming: Bed  level, Set up Upper Body Bathing: Minimal assistance, Bed level Lower Body Bathing: Bed level, Total assistance, +2 for physical assistance, +2 for safety/equipment Lower Body Bathing Details (indicate cue type and reason): Pt required +2-3 for rolling to assist with clean up in bed Upper Body Dressing : Minimal assistance, Bed level Lower Body Dressing: Total assistance, Bed level Lower Body Dressing Details (indicate cue type and reason): total  A to don <>doff braces from long sitting Toileting- Clothing Manipulation and Hygiene: +2 for physical assistance, Bed level, +2 for safety/equipment, Total assistance Functional mobility during ADLs: Maximal assistance, +2 for physical assistance, +2 for safety/equipment, Minimal assistance (MAX A-MODA +2 for bed mobility ,MIN A +2 for sit<>stand) General ADL Comments: session focus on functional sit<>stands from heavily elevated EOB   Cognition: Cognition Overall Cognitive Status: Within Functional Limits for tasks assessed Orientation Level: Oriented X4 Cognition Arousal/Alertness: Awake/alert Behavior During Therapy: WFL for tasks assessed/performed Overall Cognitive Status: Within Functional Limits for tasks assessed General Comments: Verbose with conversation requiring redirection to task     Blood pressure 118/68, pulse 70, temperature 98 F (36.7 C), temperature source Oral, resp. rate 18, height 6\' 2"  (1.88 m), weight (!) 171.8 kg, SpO2 94 %. Physical Exam Gen: no distress, normal appearing HEENT: oral  mucosa pink and moist, NCAT Cardio: Reg rate Chest: Pulmonary:    Effort: No tachypnea.    Breath sounds: Examination of the right-lower field reveals decreased breath sounds. Examination of the left-lower field reveals decreased breath sounds. Decreased breath sounds present. Abd: soft, non-distended Ext: Severe edema bilaterally with stasis changes Psych: pleasant, normal affect Skin: intact Genitourinary:    Comments: External  foley bag in plce Neurological:    Mental Status: He is alert. 5/5 strenght in upper extremities, 3/5 in LE, 2/5 LE DF     Lab Results Last 48 Hours  No results found for this or any previous visit (from the past 48 hour(s)).   Imaging Results (Last 48 hours)  No results found.       Medical Problem List and Plan: 1.  Cardiopulmonary debility             -patient may shower             -ELOS/Goals: 2-3 weeks minA 2.  Antithrombotics: -DVT/anticoagulation:  Eliquis bid             -antiplatelet therapy: N/A 3. Pain Management: Continue Tylenol prn  4. Mood: LCSW to follow for evaluation and support.              -antipsychotic agents: N/A 5. Neuropsych: This patient is capable of making decisions on his own behalf. 6. Skin/Wound Care: Routine pressure relief measures.  7. Fluids/Electrolytes/Nutrition: Strict I/O. Check CMET in am. 8. CAD/Chronic diastolic CHF: Monitor for signs of overload and check weight daily. --1500 cc FR/day. 9. COPD: Continue Breo and Incruse daily. 10. OHS/OSA: On BIPAP with 2 Liters bled in PTA.   11. Acute on chronic renal failure:? He denies any CKD --SCr trending back to new baseline around 1.3-1.4?             --Monitor for signs of overaload. Off lasix. 12. Stasis dermatitis: Continue local care with lotion  Left leg  Right leg    Right foot. 13. Intermittent Diarrhea: Monitor for now.       Bary Leriche, PA-C 03/02/2021            Note Details  Author Flora Lipps File Time 03/02/2021  4:40 PM  Author Type Physician Assistant Status Shared  Last Editor Love, Pecolia Ades [facility] [place] # [id number] [place] Date 02/14/2021

## 2021-03-02 NOTE — Progress Notes (Signed)
PMR Admission Coordinator Pre-Admission Assessment   Patient: [name] is an 80 y.o., male MRN: [id number] DOB: [birth year] Height: '6\' 2"'  (188 cm) Weight: (!) 171.8 kg   Insurance Information HMO:   PPO:  PPO premier     PCP:       IPA:       80/20:       OTHER: Group [id number] [place] PRIMARY: Bernadene Person       Policy#: [id number]      Subscriber: patient CM Name: Ubaldo Glassing      Phone#: [phone]     Fax#: [phone] I received authorization from [facility] at [facility] on 03/03/21 for admission 03/02/21-03/08/21 with updates due on 3/43/56 Pre-Cert#: [id number]      Employer: Retired Benefits:  Phone #: [phone]     Name: [email or web address] Eff. Date: 09/20/20     Deduct: $0      Out of Pocket Max: $4500 (met $1501.80)      Life Max: N/A CIR: $295/day copay for 6 days with max $1770/admission      SNF: $188/day Outpatient:       Co-Pay: $35/visit copay Home Health: 100%      Co-Pay: none DME: 80%     Co-Pay: 20% Providers: in network  SECONDARY: None   Financial Counselor:        Phone#:     The Engineer, petroleum" for patients in Inpatient Rehabilitation Facilities with attached "Privacy Act Agency Records" was provided and verbally reviewed with: Patient and Family   Emergency Contact Information Contact Information       Name Relation Home Work Mobile    [place] Spouse [phone]   [phone]    Mohamedamin, Nifong     [phone]           Current Medical History  Patient Admitting Diagnosis: Debility, Respiratory Failure   History of Present Illness:  A 80 year old male with past medical history significant for paroxysmal atrial fibrillation, aortic stenosis s/p TAVR, chronic diastolic congestive heart failure, degenerative disc disease, gout, hyperlipidemia, hypertension, morbid obesity, OSA/hypoventilation syndrome, OA, CKD3a, peripheral neuropathy, CAD, history of prostate cancer, venous stasis dermatitis who [facility] on 5/28 via EMS for weakness and dyspnea.  Patient reports was unable to get off the toilet with associated shortness of breath and generalized weakness.  Patient denies headache, no fever/chills, no nausea/vomiting/diarrhea, no palpitations, no diaphoresis, no abdominal pain.   Patient recently hospitalized at [facility] long hospital from  5/26 through 5/28 for decompensated OSA/OHS.  During that hospitalization, his BiPAP settings were optimized with a new facemask.  Patient also reports during the hospitalization he had very little mobility which he believes caused deconditioning when he returned home.   In the ED, temperature 99.2 F, HR 89, RR 29, BP 92/52, SPO2 97% on 3 L nasal cannula. Sodium 137, potassium 3.9, chloride 94, CO2 36, glucose 127, BUN 32, creatinine 1.67. WBC 10.6 (was 6.6 on 5/28 at 0127am), hemoglobin 12.2, platelets 131.  COVID-19 PCR negative.  Influenza A/B PCR negative.  Urinalysis with large leukocytes, negative nitrite, rare bacteria, greater than 50 WBCs.  VBG with pH 7.49, PaCO2 47.5, PaO2 56.  Chest x-ray with slight increase in right basilar airspace opacity compared to prior.  Blood cultures x2 ordered. Patient was started on cefepime and vancomycin in the ED for right lower lobe pneumonia. TRH consulted for further evaluation and management of pneumonia and progressive weakness.   PT/OT evaluations completed with recommendations  for inpatient rehab admission due to decline in function.     Patient's medical record from [facility] has been reviewed by the rehabilitation admission coordinator and physician.   Past Medical History      Past Medical History:  Diagnosis Date   1st degree AV block 11/09/2018    Noted on EKG   Anemia     Aortic stenosis, severe      S/p Edwards Sapien 3 Transcatheter Heart Valve (size 26 mm, model # [id number], serial # [id number])   Arthritis     Back pain     Bursitis     Cataract      left immature   Cellulitis  10/12/2015   CHF (congestive heart failure) (HCC)     Complication of anesthesia      Halucinations   Constipation      takes Miralax daily as well as Senokot daily   DDD (degenerative disc disease)     Gout      takes Allopurinol daily   Heart valve disorder     History of blood clots 1962    knee   History of blood transfusion      no abnormal reaction noted   History of shingles     Hyperlipidemia      takes Atorvastatin daily   Hypertension      takes Lisinopril daily   Low back pain 01/25/2017   Morbid obesity (HCC)     OSA (obstructive sleep apnea)     Osteoarthritis     Peripheral neuropathy      takes Gabapentin daily   Peroneal palsy      significant right foot drop   Persistent atrial fibrillation (HCC)     Pneumonia 25+yrs ago    hx of   Prostate cancer ([place]) 02/13/2019   RBBB 11/09/2018    Noted on EKG   Thrombocytopenia ([place])     Urinary frequency     Urinary urgency     Valvular heart disease     Venous stasis dermatitis        Family History   family history includes Arthritis in his sister and sister; Other in his father; Prostate cancer in his brother.   Prior Rehab/Hospitalizations Has the patient had prior rehab or hospitalizations prior to admission? No   Has the patient had major surgery during 100 days prior to admission? No               Current Medications   Current Facility-Administered Medications:   acetaminophen (TYLENOL) tablet 1,000 mg, 1,000 mg, Oral, Q6H PRN **OR** acetaminophen (TYLENOL) suppository 650 mg, 650 mg, Rectal, Q6H PRN, Reubin Milan, MD   albuterol (PROVENTIL) (2.5 MG/3ML) 0.083% nebulizer solution 2.5 mg, 2.5 mg, Nebulization, Q2H PRN, Reubin Milan, MD, 2.5 mg at 02/16/21 1552   allopurinol (ZYLOPRIM) tablet 100 mg, 100 mg, Oral, Daily, Reubin Milan, MD, 100 mg at 02/26/21 0910   apixaban (ELIQUIS) tablet 5 mg, 5 mg, Oral, BID, Reubin Milan, MD, 5 mg at 02/26/21 7588   ascorbic acid (VITAMIN  C) tablet 500 mg, 500 mg, Oral, Daily, Reubin Milan, MD, 500 mg at 02/26/21 0910   atorvastatin (LIPITOR) tablet 80 mg, 80 mg, Oral, Daily, Reubin Milan, MD, 80 mg at 02/26/21 0910   famotidine (PEPCID) tablet 40 mg, 40 mg, Oral, Daily, Reubin Milan, MD, 40 mg at 02/26/21 0910   fluticasone furoate-vilanterol (BREO ELLIPTA) 100-25 MCG/INH 1 puff, 1 puff,  Inhalation, Daily, 1 puff at 02/26/21 0812 **AND** umeclidinium bromide (INCRUSE ELLIPTA) 62.5 MCG/INH 1 puff, 1 puff, Inhalation, Daily, Reubin Milan, MD, 1 puff at 02/26/21 0813   gabapentin (NEURONTIN) capsule 300 mg, 300 mg, Oral, BID, Reubin Milan, MD, 300 mg at 02/26/21 0910   lidocaine (PF) (XYLOCAINE) 1 % injection, , , PRN, Bruning, Kevin, PA-C, 30 mL at 02/18/21 1017   polyethylene glycol (MIRALAX / GLYCOLAX) packet 17 g, 17 g, Oral, Daily, Reubin Milan, MD, 17 g at 02/26/21 0909   Patients Current Diet:  Diet Order                  Diet Heart Room service appropriate? Yes; Fluid consistency: Thin; Fluid restriction: 1500 mL Fluid  Diet effective now                         Precautions / Restrictions Precautions Precautions: Fall Precaution Comments: watch O2 Other Brace: Has bilateral knee braces/compression wrap (for stability); left knee hinged brace then places elastic sleeve over brace; rt knee prefers ace wrap tightly for support Restrictions Weight Bearing Restrictions: No    Has the patient had 2 or more falls or a fall with injury in the past year? No   Prior Activity Level Limited Community (1-2x/wk): Went out 3-4 days a week   Prior Functional Level Self Care: Did the patient need help bathing, dressing, using the toilet or eating? Needed some help   Indoor Mobility: Did the patient need assistance with walking from room to room (with or without device)? Independent   Stairs: Did the patient need assistance with internal or external stairs (with or without device)?  Needed some help   Functional Cognition: Did the patient need help planning regular tasks such as shopping or remembering to take medications? Independent   Home Assistive Devices / Equipment Home Assistive Devices/Equipment: Gilford Rile (specify type) Home Equipment: Transport planner, Environmental consultant - 2 wheels   Prior Device Use: Indicate devices/aids used by the patient prior to current illness, exacerbation or injury? Motorized wheelchair or scooter, Adult nurse, and Location manager   Overall Cognitive Status: Within Functional Limits for tasks assessed Orientation Level: Oriented X4 General Comments: Verbose with conversation requiring redirection to task    Extremity Assessment (includes Sensation/Coordination)   Upper Extremity Assessment: Generalized weakness, LUE deficits/detail LUE Deficits / Details: limited ROM to 90 degrees flexion in LUE  Lower Extremity Assessment: Defer to PT evaluation     ADLs   Overall ADL's : Needs assistance/impaired Eating/Feeding: Sitting, Set up Eating/Feeding Details (indicate cue type and reason): Pt able to bring a drink to his mouth and take a few sips once placed in front of him Grooming: Bed level, Set up Upper Body Bathing: Minimal assistance, Bed level Lower Body Bathing: Bed level, Total assistance, +2 for physical assistance, +2 for safety/equipment Lower Body Bathing Details (indicate cue type and reason): Pt required +2-3 for rolling to assist with clean up in bed Upper Body Dressing : Minimal assistance, Bed level Lower Body Dressing: Moderate assistance, Sitting/lateral leans Lower Body Dressing Details (indicate cue type and reason): Pt donning shoes and knee braces, needs assist with back of shoes and tying knots and with knee braces pt needs assist getting them pulled up to his calves then he can pull them up and secure them the rest of the way, Toileting- Clothing Manipulation and Hygiene: +2 for physical  assistance, Bed  level, +2 for safety/equipment, Total assistance Functional mobility during ADLs: Maximal assistance, +2 for physical assistance, +2 for safety/equipment General ADL Comments: session focus on bed level therex, lateral reaching to faciliate improved bed mobility and BUE therex     Mobility   Overal bed mobility: Needs Assistance Bed Mobility: Supine to Sit, Sit to Supine Rolling: Min guard Supine to sit: Min assist, HOB elevated Sit to supine: Max assist, +2 for physical assistance General bed mobility comments: pt able to get legs over EOB, then with HOB 45 degrees, pt pushed up onto left elbow and pulled against PT with RUE to raise torso--partially simulating how he pushes up from supine on his firm, king bed at home; return to supine requires 1 person assist to each leg and pivot on his back, then pull up to [facility] with bed in trendelenburg     Transfers   Overall transfer level: Needs assistance Equipment used: Bilateral platform walker Transfers: Sit to/from Stand Sit to Stand: From elevated surface, Min assist, +2 safety/equipment General transfer comment: with bil knees braced/wrapped, shoes, and bed elevated pt able to stand x3 (1x 90 seconds; 1x 60 seconds; 1x 45 seconds). As knees fatigue, see increased ?asterixis ?myoclonus/bil knee buckling and then catching     Ambulation / Gait / Stairs / Wheelchair Mobility   Ambulation/Gait General Gait Details: unable this session; pt can take full weight on bil UEs and LLE to allow him to move/advance RLE, however can't do the opposite leg     Posture / Balance Dynamic Sitting Balance Sitting balance - Comments: Requires assist with initial sitting. Posterior leaning. Able to correct when cued and bed elevated to make room for panus/abdomen Balance Overall balance assessment: Needs assistance Sitting-balance support: Feet supported (bed elevated) Sitting balance-Leahy Scale: Fair Sitting balance - Comments: Requires assist  with initial sitting. Posterior leaning. Able to correct when cued and bed elevated to make room for panus/abdomen Postural control: Posterior lean Standing balance-Leahy Scale: Poor Standing balance comment: Heavy reliance on BUE support as well as right leg locked in extension     Special needs/care consideration BiPAP - has a Trilogy 100 at the bedside for nighttime use, Oxygen On 02 3L by trilogy at night and 2 1/2 L during the day [place], and Skin Venous stasis ulcers lower legs; Requires bariatric equipment     Previous Home Environment (from acute therapy documentation) Living Arrangements: Spouse/significant other, Children Available Help at Discharge: Family, Available 24 hours/day Type of Home: House Home Layout: One level Home Access: Ramped entrance Bathroom Shower/Tub: Other (comment) (sponge bathes) Bathroom Toilet: Handicapped height Bathroom Accessibility: Yes [place]: No Additional Comments: has a lift chair   Discharge Living Setting Plans for Discharge Living Setting: Patient's home, House, Lives with (comment) (Lives with wife and 60 yo son) Type of Home at Discharge: House Discharge Home Layout: Two level Alternate Level Stairs-Number of Steps: Flight Discharge Home Access: [place] entrance (Has a lift at side garage entrance) Discharge Bathroom Shower/Tub: Walk-in shower, Door (Has not used tub or shower in 4 years.) Discharge Bathroom Toilet: Handicapped height Discharge Bathroom Accessibility: Yes How Accessible: Accessible via walker Does the patient have any problems obtaining your medications?: No   Social/Family/Support Systems Patient Roles: Spouse, Parent, Other (Comment) (Has wife, sons, granddaughter and friends.) Contact Information: Koree Schopf - wife - [phone] Anticipated Caregiver: Wife, sons, granddaughter Ability/Limitations of Caregiver: One son works days, another son works from home, granddaughter available this summer, wife with  early  dementia. Caregiver Availability: 24/7 Discharge Plan Discussed with Primary Caregiver: Yes Is Caregiver In Agreement with Plan?: Yes Does Caregiver/Family have Issues with Lodging/Transportation while Pt is in Rehab?: No   Goals Patient/Family Goal for Rehab: PT/OT supervision to min assist goals Expected length of stay: 10-14 days Cultural Considerations: None Pt/Family Agrees to Admission and willing to participate: Yes Program Orientation Provided & Reviewed with Pt/Caregiver Including Roles  & Responsibilities: Yes   Decrease burden of Care through IP rehab admission: N/A   Possible need for SNF placement upon discharge: Not planned   Patient Condition: I have reviewed medical records from [facility], spoken with CM, and patient, spouse, and family member. I met with patient at the bedside for inpatient rehabilitation assessment.  Patient will benefit from ongoing PT and OT, can actively participate in 3 hours of therapy a day 5 days of the week, and can make measurable gains during the admission.  Patient will also benefit from the coordinated team approach during an Inpatient Acute Rehabilitation admission.  The patient will receive intensive therapy as well as Rehabilitation physician, nursing, social worker, and care management interventions.  Due to bladder management, bowel management, safety, skin/wound care, disease management, medication administration, pain management, and patient education the patient requires 24 hour a day rehabilitation nursing.  The patient is currently min +2 to max +2 with mobility and basic ADLs.  Discharge setting and therapy post discharge at home with home health is anticipated.  Patient has agreed to participate in the Acute Inpatient Rehabilitation Program and will admit once medically ready and bed available.   Preadmission Screen Completed By:  Retta Diones, 02/26/2021 1:55  PM ______________________________________________________________________   Discussed status with Dr. Ranell Patrick  on 03/03/21 at 74 and received approval for admission today.   Admission Coordinator:  Retta Diones, RN, time 1200 Sudie Grumbling 03/03/21   Assessment/Plan: Diagnosis: Debility Does the need for close, 24 hr/day Medical supervision in concert with the patient's rehab needs make it unreasonable for this patient to be served in a less intensive setting? Yes Co-Morbidities requiring supervision/potential complications: acute respiratory failure with hypoxia, hypercholesterolemia, obesity class 3, macrocytic anemia, OSA, COPD, Stage 3 CKD Due to bladder management, bowel management, safety, skin/wound care, disease management, medication administration, pain management, and patient education, does the patient require 24 hr/day rehab nursing? Yes Does the patient require coordinated care of a physician, rehab nurse, PT, OT to address physical and functional deficits in the context of the above medical diagnosis(es)? Yes Addressing deficits in the following areas: balance, endurance, locomotion, strength, transferring, bowel/bladder control, bathing, dressing, feeding, grooming, toileting, and psychosocial support Can the patient actively participate in an intensive therapy program of at least 3 hrs of therapy 5 days a week? Yes The potential for patient to make measurable gains while on inpatient rehab is good Anticipated functional outcomes upon discharge from inpatient rehab: min assist PT, min assist OT, independent SLP Estimated rehab length of stay to reach the above functional goals is: 2-3 weeks Anticipated discharge destination: Home 10. Overall Rehab/Functional Prognosis: excellent     MD Signature: Leeroy Cha, MD          Revision History

## 2021-03-02 NOTE — Progress Notes (Addendum)
Inpatient Rehabilitation Medication Review by a Pharmacist  A complete drug regimen review was completed for this patient to identify any potential clinically significant medication issues.  Clinically significant medication issues were identified:  Yes   Type of Medication Issue Identified Description of Issue Urgent (address now) Non-Urgent (address on AM team rounds) Plan Plan Accepted by Provider? (Yes / No / Pending AM Rounds)  Drug Interaction(s) (clinically significant)       Duplicate Therapy       Allergy       No Medication Administration End Date       Incorrect Dose       Additional Drug Therapy Needed       Other  The following medications were listed on pt's discharge med list, but not ordered on admission to CIR (these were PTA meds that pt did not receive at [facility]): Furosemide  Vitamin D3 Azo Standard tablet Centrum Silver Co-Q10 Tart cherry Non urgent To be reviewed by CIR team on AM rounds Team is aware and will assess if needed    Name of provider notified for urgent issues identified:  N/A  For non-urgent medication issues to be resolved on team rounds tomorrow morning a [facility] Secure Chat Handoff was sent to:  N/A (med review completed after 5 PM)  Time spent performing this drug regimen review (minutes):  [address] PharmD, [place], AAHIVP, CPP Infectious Disease Pharmacist 03/03/2021 8:46 AM

## 2021-03-02 NOTE — H&P (Signed)
All Collapse All       Physical Medicine and Rehabilitation Admission H&P        Chief Complaint  Patient presents with   Debility   OHS/hypoxia.       HPI: [name] is a 80 year old male with history of PAF, Aortic stenosis s/p TVAR, gout, morbid obesity, CKD, peripheral neuropathy, DDD cervical/lumbar spine, COPD, venous stasis dermatitis, CAD, OSA/OHS --BIPAP w/3 L, recent admission 05/26-05/28/22 for decompensated respiratory failure. He was readmitted early am of 02/15/21 with inability to get off commode, DOE with hypoxia and weakness. He was found to have acute chronic respiratory failure requiring 3 L oxygen per [place].  CXR showed RLL airspace disease. He was treated with 7 day course of IV cefepime for PNA and CT chest done due to ongoing respiratory symptoms. This revealed moderate right and small left pleural effusions as well as mild pulmonary edema.  2D echo showed EF >75% with severe mitral stenosis and moderate concentric LVH with flattening of interventricular septum in systole/diastole. He was treated with dose of lasix for diuresis and underwent thoracocentesis of 700 cc serosanguinous fluid on 06/01 with improvement in symptoms.   On 06/05, patient noted to have worsening of BUE myoclonus jerks  with inability to speak. EEG was negative for seizures and  CT head showed partially calcified meningioma in posterior frontal left>right side of falx with mass effect on medial frontal lobe with  mild edema. Neurology consulted and felt that symptoms due to cervical stenosis --keppra recommended to manage symptoms and follow up with neurology after discharge. Furosemide on hold due to upward trend in SCr. He continues to be limited by SOB with fatigue as well as flexed posture with standing attempts. CIR recommended due to functional decline. Very concerned about his urination.      Review of Systems Constitutional:  Negative for chills and fever. HENT:  Negative for hearing loss.    Eyes:  Negative for blurred vision and double vision. Respiratory:  Negative for shortness of breath and stridor.   Cardiovascular:  Positive for leg swelling. Negative for chest pain. Gastrointestinal:  Positive for diarrhea. Negative for abdominal pain. Genitourinary:  Positive for urgency. Musculoskeletal:  Positive for back pain, falls (in the past--none recently), joint pain and myalgias. Neurological:  Positive for sensory change (burning stinging BLE to knees and bilateral hands.) and weakness. Negative for dizziness and headaches. Psychiatric/Behavioral:  The patient is not nervous/anxious.           Past Medical History:  Diagnosis Date   1st degree AV block 11/09/2018    Noted on EKG   Anemia     Aortic stenosis, severe      S/p Edwards Sapien 3 Transcatheter Heart Valve (size 26 mm, model # [id number], serial # [id number])   Arthritis     Back pain     Bursitis     Cataract      left immature   Cellulitis 10/12/2015   CHF (congestive heart failure) (HCC)     Complication of anesthesia      Halucinations   Constipation      takes Miralax daily as well as Senokot daily   DDD (degenerative disc disease)     Gout      takes Allopurinol daily   Heart valve disorder     History of blood clots 1962    knee   History of blood transfusion      no abnormal reaction noted  History of shingles     Hyperlipidemia      takes Atorvastatin daily   Hypertension      takes Lisinopril daily   Low back pain 01/25/2017   Morbid obesity ([place])     OSA (obstructive sleep apnea)     Osteoarthritis     Peripheral neuropathy      takes Gabapentin daily   Peroneal palsy      significant right foot drop   Persistent atrial fibrillation (HCC)     Pneumonia 25+yrs ago    hx of   Prostate cancer ([place]) 02/13/2019   RBBB 11/09/2018    Noted on EKG   Thrombocytopenia (HCC)     Urinary frequency     Urinary urgency     Valvular heart disease     Venous stasis dermatitis              Past Surgical History:  Procedure Laterality Date   CARDIAC CATHETERIZATION N/A 05/02/2015    Procedure: Right/Left Heart Cath and Coronary Angiography;  Surgeon: Burnell Blanks, MD;  Location: [facility];  Service: Cardiovascular;  Laterality: N/A;   CARDIOVERSION N/A 10/27/2018    Procedure: CARDIOVERSION;  Surgeon: Fay Records, MD;  Location: [facility] ENDOSCOPY;  Service: Cardiovascular;  Laterality: N/A;   COLONOSCOPY N/A 02/07/2013    Procedure: COLONOSCOPY;  Surgeon: Juanita Craver, MD;  Location: [facility] ENDOSCOPY;  Service: Endoscopy;  Laterality: N/A;   COLONOSCOPY N/A 02/09/2013    Procedure: COLONOSCOPY;  Surgeon: Beryle Beams, MD;  Location: [place];  Service: Endoscopy;  Laterality: N/A;   ESOPHAGOGASTRODUODENOSCOPY N/A 02/07/2013    Procedure: ESOPHAGOGASTRODUODENOSCOPY (EGD);  Surgeon: Juanita Craver, MD;  Location: [facility] ENDOSCOPY;  Service: Endoscopy;  Laterality: N/A;   GIVENS CAPSULE STUDY N/A 02/09/2013    Procedure: GIVENS CAPSULE STUDY;  Surgeon: Beryle Beams, MD;  Location: [place];  Service: Endoscopy;  Laterality: N/A;   HERNIA REPAIR        umbilical hernia   IR THORACENTESIS ASP PLEURAL SPACE W/IMG GUIDE   02/18/2021   KNEE SURGERY Right      multiple knee surgeries due to complication of R TKA   LAMINOTOMY   1193    c6-t2   PACEMAKER IMPLANT N/A 04/27/2018    Procedure: PACEMAKER IMPLANT;  Surgeon: Constance Haw, MD;  Location: [facility];  Service: Cardiovascular;  Laterality: N/A;   PROSTATE BIOPSY N/A 04/12/2019    Procedure: BIOPSY TRANSRECTAL ULTRASONIC PROSTATE (TUBP) CYSTOSCOPY;  Surgeon: [name] Hughs, MD;  Location: [facility];  Service: Urology;  Laterality: N/A;   SPINE SURGERY       TEE WITHOUT CARDIOVERSION N/A 03/07/2015    Procedure: TRANSESOPHAGEAL ECHOCARDIOGRAM (TEE);  Surgeon: Josue Hector, MD;  Location: [facility] ENDOSCOPY;  Service: Cardiovascular;  Laterality: N/A;  ANES TO BRING PROPOFOL PER DOCTOR   TEE WITHOUT  CARDIOVERSION N/A 06/10/2015    Procedure: TRANSESOPHAGEAL ECHOCARDIOGRAM (TEE);  Surgeon: Burnell Blanks, MD;  Location: [place];  Service: Open Heart Surgery;  Laterality: N/A;   TOTAL KNEE ARTHROPLASTY        right   x4 left x 1   TRANSCATHETER AORTIC VALVE REPLACEMENT, TRANSFEMORAL Left 06/10/2015    Procedure: TRANSCATHETER AORTIC VALVE REPLACEMENT, TRANSFEMORAL;  Surgeon: Burnell Blanks, MD;  Location: [place];  Service: Open Heart Surgery;  Laterality: Left;           Family History  Problem Relation Age of Onset   Other Father  POSSIBLE HEART ATTACK   Arthritis Sister          "crippling"   Prostate cancer Brother     Arthritis Sister     Breast cancer Neg Hx     Colon cancer Neg Hx        Social History:  Married. Wife has dementia? Has problems with transfers--uses lift chai and uses walker when out of home. His smoking use included pipe. He has never used smokeless tobacco. He reports that he does not drink alcohol and does not use drugs.          Allergies  Allergen Reactions   Sulfa Antibiotics        Other reaction(s): Unknown   Coumadin [Warfarin Sodium] Other (See Comments)      States he can't be on this-bleeds out            Medications Prior to Admission  Medication Sig Dispense Refill   acetaminophen (TYLENOL) 500 MG tablet Take 2,000 mg by mouth 2 (two) times daily.       albuterol (VENTOLIN HFA) 108 (90 Base) MCG/ACT inhaler TAKE 2 PUFFS BY MOUTH EVERY 6 HOURS AS NEEDED 18 each 1   allopurinol (ZYLOPRIM) 100 MG tablet Take 1 tablet (100 mg total) by mouth daily. 90 tablet 3   atorvastatin (LIPITOR) 80 MG tablet 1 tablet by mouth daily 90 tablet 1   cetirizine (ZYRTEC) 10 MG tablet Take 1 tablet (10 mg total) by mouth daily. (Patient not taking: Reported on 02/13/2021) 30 tablet 11   Cholecalciferol (VITAMIN D3) 125 MCG (5000 UT) CAPS Take 1,000 Units by mouth daily.       Coenzyme Q10 (COQ10) 100 MG CAPS Take 100 mg by mouth daily.        ELIQUIS 5 MG TABS tablet TAKE 1 TABLET BY MOUTH TWICE A DAY 60 tablet 6   Ferrous Fumarate-Folic Acid (HEMATINIC/FOLIC ACID) 528-4 MG TABS Take 1 tablet by mouth daily. (Patient not taking: Reported on 02/13/2021) 90 tablet 1   Fluticasone-Umeclidin-Vilant (TRELEGY ELLIPTA) 100-62.5-25 MCG/INH AEPB Inhale 1 puff into the lungs daily. 60 each 12   gabapentin (NEURONTIN) 300 MG capsule Take 1 capsule (300 mg total) by mouth 3 (three) times daily. 270 capsule 1   Misc Natural Products (TART CHERRY ADVANCED PO) Take 1,200 mg by mouth 2 (two) times a day.       Multiple Vitamins-Minerals (CENTRUM SILVER PO) Take 1 tablet by mouth daily.       Phenazopyridine HCl (AZO-STANDARD PO) Take 1 tablet by mouth 2 (two) times daily.       vitamin C (ASCORBIC ACID) 500 MG tablet Take 500 mg by mouth daily.       [DISCONTINUED] furosemide (LASIX) 20 MG tablet Take 1 tablet (20 mg total) by mouth 2 (two) times daily. 180 tablet 1   [DISCONTINUED] polyethylene glycol (MIRALAX / GLYCOLAX) packet Take 17 g by mouth daily.          Drug Regimen Review  Drug regimen was reviewed and remains appropriate with no significant issues identified   Home: Home Living Family/patient expects to be discharged to:: Private residence Living Arrangements: Spouse/significant other, Children Available Help at Discharge: Family, Available 24 hours/day Type of Home: House Home Access: Ramped entrance Home Layout: One level Bathroom Shower/Tub: Other (comment) (sponge bathes) Bathroom Toilet: Handicapped height Bathroom Accessibility: Yes Home Equipment: Transport planner, Environmental consultant - 2 wheels Additional Comments: has a lift chair   Functional History: Prior Function Level of  Independence: Needs assistance Gait / Transfers Assistance Needed: walks with a bilateral PFRW inside his home and uses a power w/c when out in the community ADL's / Homemaking Assistance Needed: wife and son assist with LBD and occasionally help getting  legs into bed. Pt sponge bathes. Comments: Pt states he sits on the toilet to bathe and hasn't gotten in his shower for 4 years. Lift chair at home   Functional Status:  Mobility: Bed Mobility Overal bed mobility: Needs Assistance Bed Mobility: Supine to Sit, Sit to Supine Rolling: Min guard Supine to sit: HOB elevated, Min assist Sit to supine: Max assist, +2 for physical assistance General bed mobility comments: pt able to maneuver BLES to EOB with increased time/effort and min assist, cues needed to reach RUE over to bed rail, pt pushes up on left elbow and pulls up on rail with RUE (no assist to raise torso or scoot hip to EOB. MAX A +2 to fully return to supine, pt able to descend onto pts LUE but needed MAX A +2 to elevate BLEs and lower trunk back down to bed Transfers Overall transfer level: Needs assistance Equipment used: Bilateral platform walker Transfers: Sit to/from Stand Sit to Stand: Min assist, +2 physical assistance, From elevated surface General transfer comment: Bilateral knee braces/wraps donned prior to standing; no shoes/bare feet to simulate how he walks at home (typically no shoes) bed elevated per pt request to simulate bed height at home; pt insistent on pulling up on RW, MIN A +2 to power up in standing. completed sit<>stand x2 trials and unable to fully stand on 3rd atttempt. attempted lateral weight shifting with heel toe scooting with LLE. pt able to minimally move left heel (when previously with shoes or gripper socks has not been able to move at all) Ambulation/Gait General Gait Details: unable this session; pt can take full weight on bil UEs and LLE to allow him to move/advance RLE, however can't do the opposite leg   ADL: ADL Overall ADL's : Needs assistance/impaired Eating/Feeding: Sitting, Set up Eating/Feeding Details (indicate cue type and reason): Pt able to bring a drink to his mouth and take a few sips once placed in front of him Grooming: Bed  level, Set up Upper Body Bathing: Minimal assistance, Bed level Lower Body Bathing: Bed level, Total assistance, +2 for physical assistance, +2 for safety/equipment Lower Body Bathing Details (indicate cue type and reason): Pt required +2-3 for rolling to assist with clean up in bed Upper Body Dressing : Minimal assistance, Bed level Lower Body Dressing: Total assistance, Bed level Lower Body Dressing Details (indicate cue type and reason): total  A to don <>doff braces from long sitting Toileting- Clothing Manipulation and Hygiene: +2 for physical assistance, Bed level, +2 for safety/equipment, Total assistance Functional mobility during ADLs: Maximal assistance, +2 for physical assistance, +2 for safety/equipment, Minimal assistance (MAX A-MODA +2 for bed mobility ,MIN A +2 for sit<>stand) General ADL Comments: session focus on functional sit<>stands from heavily elevated EOB   Cognition: Cognition Overall Cognitive Status: Within Functional Limits for tasks assessed Orientation Level: Oriented X4 Cognition Arousal/Alertness: Awake/alert Behavior During Therapy: WFL for tasks assessed/performed Overall Cognitive Status: Within Functional Limits for tasks assessed General Comments: Verbose with conversation requiring redirection to task     Blood pressure 118/68, pulse 70, temperature 98 F (36.7 C), temperature source Oral, resp. rate 18, height 6\' 2"  (1.88 m), weight (!) 171.8 kg, SpO2 94 %. Physical Exam Gen: no distress, normal appearing HEENT: oral  mucosa pink and moist, NCAT Cardio: Reg rate Chest: Pulmonary:    Effort: No tachypnea.    Breath sounds: Examination of the right-lower field reveals decreased breath sounds. Examination of the left-lower field reveals decreased breath sounds. Decreased breath sounds present. Abd: soft, non-distended Ext: Severe edema bilaterally with stasis changes Psych: pleasant, normal affect Skin: intact Genitourinary:    Comments: External  foley bag in plce Neurological:    Mental Status: He is alert. 5/5 strenght in upper extremities, 3/5 in LE, 2/5 LE DF     Lab Results Last 48 Hours  No results found for this or any previous visit (from the past 48 hour(s)).   Imaging Results (Last 48 hours)  No results found.       Medical Problem List and Plan: 1.  Cardiopulmonary debility             -patient may shower             -ELOS/Goals: 2-3 weeks minA  2.  Antithrombotics: -DVT/anticoagulation:  Eliquis bid             -antiplatelet therapy: N/A 3. Pain Management: Continue Tylenol prn  4. Mood: LCSW to follow for evaluation and support.              -antipsychotic agents: N/A 5. Neuropsych: This patient is capable of making decisions on his own behalf. 6. Skin/Wound Care: Routine pressure relief measures.  7. Fluids/Electrolytes/Nutrition: Strict I/O. Check CMET in am. 8. CAD/Chronic diastolic CHF: Monitor for signs of overload and check weight daily. --1500 cc FR/day. 9. COPD: Continue Breo and Incruse daily. 10. OHS/OSA: On BIPAP with 2 Liters bled in PTA.   11. Acute on chronic renal failure:? He denies any CKD --SCr trending back to new baseline around 1.3-1.4?             --Monitor for signs of overaload. Off lasix. 12. Stasis dermatitis: Continue local care with lotion  Left leg  Right leg    Right foot. 13. Intermittent Diarrhea: Monitor for now.  14. Urinary urgency, chronic: this is very distressing to patient, so please attend to him as soon as possible. Would benefit from briefs and q1H timed voiding while awake.        Bary Leriche, PA-C 03/02/2021            Note Details  Author Flora Lipps File Time 03/02/2021  4:40 PM  Author Type Physician Assistant Status Shared  Last Editor Love, Pecolia Ades [facility] # [phone] [place] Date 02/14/2021

## 2021-03-03 LAB — COMPREHENSIVE METABOLIC PANEL
ALT: 17 U/L (ref 0–44)
AST: 20 U/L (ref 15–41)
Albumin: 2.5 g/dL — ABNORMAL LOW (ref 3.5–5.0)
Alkaline Phosphatase: 62 U/L (ref 38–126)
Anion gap: 6 (ref 5–15)
BUN: 21 mg/dL (ref 8–23)
CO2: 34 mmol/L — ABNORMAL HIGH (ref 22–32)
Calcium: 9.1 mg/dL (ref 8.9–10.3)
Chloride: 99 mmol/L (ref 98–111)
Creatinine, Ser: 1.34 mg/dL — ABNORMAL HIGH (ref 0.61–1.24)
GFR, Estimated: 54 mL/min — ABNORMAL LOW (ref >60)
Glucose, Bld: 91 mg/dL (ref 70–99)
Potassium: 4.2 mmol/L (ref 3.5–5.1)
Sodium: 139 mmol/L (ref 135–145)
Total Bilirubin: 0.4 mg/dL (ref 0.3–1.2)
Total Protein: 5.3 g/dL — ABNORMAL LOW (ref 6.5–8.1)

## 2021-03-03 LAB — CBC WITH DIFFERENTIAL/PLATELET
Abs Immature Granulocytes: 0.04 10*3/uL (ref 0.00–0.07)
Basophils Absolute: 0 10*3/uL (ref 0.0–0.1)
Basophils Relative: 0 %
Eosinophils Absolute: 0.1 10*3/uL (ref 0.0–0.5)
Eosinophils Relative: 2 %
HCT: 34.6 % — ABNORMAL LOW (ref 39.0–52.0)
Hemoglobin: 10.8 g/dL — ABNORMAL LOW (ref 13.0–17.0)
Immature Granulocytes: 1 %
Lymphocytes Relative: 15 %
Lymphs Abs: 0.9 10*3/uL (ref 0.7–4.0)
MCH: 32.3 pg (ref 26.0–34.0)
MCHC: 31.2 g/dL (ref 30.0–36.0)
MCV: 103.6 fL — ABNORMAL HIGH (ref 80.0–100.0)
Monocytes Absolute: 0.5 10*3/uL (ref 0.1–1.0)
Monocytes Relative: 8 %
Neutro Abs: 4.4 10*3/uL (ref 1.7–7.7)
Neutrophils Relative %: 74 %
Platelets: 154 10*3/uL (ref 150–400)
RBC: 3.34 MIL/uL — ABNORMAL LOW (ref 4.22–5.81)
RDW: 16 % — ABNORMAL HIGH (ref 11.5–15.5)
WBC: 5.9 10*3/uL (ref 4.0–10.5)
nRBC: 0 % (ref 0.0–0.2)

## 2021-03-03 MED ORDER — BISACODYL 10 MG RE SUPP: 10.0000 mg | Freq: Every day | RECTAL | Status: DC | PRN

## 2021-03-03 MED ORDER — PROCHLORPERAZINE EDISYLATE 10 MG/2ML IJ SOLN: 5.0000 mg | Freq: Four times a day (QID) | INTRAMUSCULAR | Status: DC | PRN

## 2021-03-03 MED ORDER — PROCHLORPERAZINE 25 MG RE SUPP: 12.5000 mg | Freq: Four times a day (QID) | RECTAL | Status: DC | PRN

## 2021-03-03 MED ORDER — GUAIFENESIN-DM 100-10 MG/5ML PO SYRP: 5.0000 mL | ORAL_SOLUTION | Freq: Four times a day (QID) | ORAL | Status: DC | PRN

## 2021-03-03 MED ORDER — TRAZODONE HCL 50 MG PO TABS
25.0000 mg | ORAL_TABLET | Freq: Every evening | ORAL | Status: DC | PRN
  Administered 2021-03-14 – 2021-03-15 (×2): 50 mg via ORAL
  Filled 2021-03-03 (×2): qty 1

## 2021-03-03 MED ORDER — ACETAMINOPHEN 325 MG PO TABS
325.0000 mg | ORAL_TABLET | ORAL | Status: DC | PRN
  Administered 2021-03-04 – 2021-03-13 (×7): 650 mg via ORAL
  Filled 2021-03-03 (×7): qty 2

## 2021-03-03 MED ORDER — DIPHENHYDRAMINE HCL 12.5 MG/5ML PO ELIX: 12.5000 mg | ORAL_SOLUTION | Freq: Four times a day (QID) | ORAL | Status: DC | PRN

## 2021-03-03 MED ORDER — ALUM & MAG HYDROXIDE-SIMETH 200-200-20 MG/5ML PO SUSP: 30.0000 mL | ORAL | Status: DC | PRN

## 2021-03-03 MED ORDER — FLEET ENEMA 7-19 GM/118ML RE ENEM: 1.0000 | ENEMA | Freq: Once | RECTAL | Status: DC | PRN

## 2021-03-03 MED ORDER — POLYETHYLENE GLYCOL 3350 17 G PO PACK
17.0000 g | PACK | Freq: Every day | ORAL | Status: DC | PRN
  Administered 2021-03-16 – 2021-03-17 (×2): 17 g via ORAL
  Filled 2021-03-03 (×2): qty 1

## 2021-03-03 MED ORDER — PROCHLORPERAZINE MALEATE 5 MG PO TABS: 5.0000 mg | ORAL_TABLET | Freq: Four times a day (QID) | ORAL | Status: DC | PRN

## 2021-03-03 NOTE — Progress Notes (Signed)
[facility] Individual Statement of Services  Patient Name:  [name]  Date:  03/03/2021  Welcome to the [facility].  Our goal is to provide you with an individualized program based on your diagnosis and situation, designed to meet your specific needs.  With this comprehensive rehabilitation program, you will be expected to participate in at least 3 hours of rehabilitation therapies Monday-Friday, with modified therapy programming on the weekends.  Your rehabilitation program will include the following services:  Physical Therapy (PT), Occupational Therapy (OT), 24 hour per day rehabilitation nursing, Care Coordinator, Rehabilitation Medicine, Nutrition Services, and Pharmacy Services  Weekly team conferences will be held on Tuesday to discuss your progress.  Your Inpatient Rehabilitation Care Coordinator will talk with you frequently to get your input and to update you on team discussions.  Team conferences with you and your family in attendance may also be held.  Expected length of stay: 3-4 weeks  Overall anticipated outcome: supervision-min assist level  Depending on your progress and recovery, your program may change. Your Inpatient Rehabilitation Care Coordinator will coordinate services and will keep you informed of any changes. Your Inpatient Rehabilitation Care Coordinator's name and contact numbers are listed  below.  The following services may also be recommended but are not provided by the [facility]:   [facility] [facility] will be made to provide these services after discharge if needed.  Arrangements include referral to agencies that provide these services.  Your insurance has been verified to be: [place] primary doctor is:  Penni Homans  Pertinent information will be shared with your doctor and your insurance  company.  Inpatient Rehabilitation Care Coordinator:  Ovidio Kin, Anthon or Emilia Beck  Information discussed with and copy given to patient by: Elease Hashimoto, 03/03/2021, 10:07 AM

## 2021-03-03 NOTE — Discharge Instructions (Addendum)
Inpatient Rehab Discharge Instructions  [facility] Discharge date and time: 03/18/21   Activities/Precautions/ Functional Status: Activity: activity as tolerated Diet: cardiac diet--limit fluids to 1500 cc/day Wound Care: none needed  Functional status:  ___ No restrictions             ___ Walk up steps independently _X__ 24/7 supervision/assistance   ___ Walk up steps with assistance ___ Intermittent supervision/assistance  ___ Bathe/dress independently ___ Walk with walker     ___ Bathe/dress with assistance ___ Walk Independently             ___ Shower independently _X__ Walk with assistance    ___ Shower with assistance _X__ No alcohol             ___ Return to work/school ________  Special Instructions:  COMMUNITY REFERRALS UPON DISCHARGE:    Home Health:   PT, OT, RN                Agency: [facility]  Phone: [phone]  Medical Equipment/Items Ordered:BROUGHT RAISED/POWER COMMODE ON OWN                                                 Agency/Supplier:NA  My questions have been answered and I understand these instructions. I will adhere to these goals and the provided educational materials after my discharge from the hospital.  Patient/Caregiver Signature _______________________________ Date __________  Clinician Signature _______________________________________ Date __________  Please bring this form and your medication list with you to all your follow-up doctor's appointments.  Information on my medicine - ELIQUIS (apixaban)  This medication education was reviewed with me or my healthcare representative as part of my discharge preparation.  The pharmacist that spoke with me during my hospital stay was:    Why was Eliquis prescribed for you? Eliquis was prescribed for you to reduce the risk of a blood clot forming that can cause a stroke if you have a medical condition called atrial fibrillation (a type of irregular heartbeat).  What do  You need to know about Eliquis ? Take your Eliquis TWICE DAILY - one tablet in the morning and one tablet in the evening with or without food. If you have difficulty swallowing the tablet whole please discuss with your pharmacist how to take the medication safely.  Take Eliquis exactly as prescribed by your doctor and DO NOT stop taking Eliquis without talking to the doctor who prescribed the medication.  Stopping may increase your risk of developing a stroke.  Refill your prescription before you run out.  After discharge, you should have regular check-up appointments with your healthcare provider that is prescribing your Eliquis.  In the future your dose may need to be changed if your kidney function or weight changes by a significant amount or as you get older.  What do you do if you miss a dose? If you miss a dose, take it as soon as you remember on the same day and resume taking twice daily.  Do not take more than one dose of ELIQUIS at the same time to make up a missed dose.  Important Safety Information A possible side effect of Eliquis is bleeding. You should call your healthcare provider right away if you experience any of the following: Bleeding from an injury or your nose that does  not stop. Unusual colored urine (red or dark brown) or unusual colored stools (red or black). Unusual bruising for unknown reasons. A serious fall or if you hit your head (even if there is no bleeding).  Some medicines may interact with Eliquis and might increase your risk of bleeding or clotting while on Eliquis. To help avoid this, consult your healthcare provider or pharmacist prior to using any new prescription or non-prescription medications, including herbals, vitamins, non-steroidal anti-inflammatory drugs (NSAIDs) and supplements.  This website has more information on Eliquis (apixaban): http://www.eliquis.com/eliquis/home

## 2021-03-03 NOTE — Patient Care Conference (Addendum)
Inpatient RehabilitationTeam Conference and Plan of Care Update Date: 03/03/2021   Time: 12:00 PM    Patient Name: [name] [facility]      Medical Record Number: [id number]  Date of Birth: [birth year] Sex: Male         Room/Bed: 4W20C/4W20C-01 Payor Info: Payor: [employer] MEDICARE / Plan: [employer] MEDICARE HMO/PPO / Product Type: *No Product type* /    Admit Date/Time:  03/02/2021  4:43 PM  Primary Diagnosis:  <principal problem not specified>  Hospital Problems: Active Problems:   Pressure injury of skin   Debility    Expected Discharge Date: Expected Discharge Date:  (ELOS 2-3 weeks; evals pending)  Team Members Present: Physician leading conference: Dr. Courtney Heys Care Coodinator Present: Dorien Chihuahua, RN, BSN, CRRN;[name], LCSW Nurse Present: Dorien Chihuahua, RN PT Present: Magda Kiel, PT OT Present: Other (comment) Richmond Campbell, OT) [place] Coordinator present : Ileana Ladd, PT     Current Status/Progress Goal Weekly Team Focus  Bowel/Bladder   Patient is incontinent of both b&b  Patient will regain continence of both b&b  scheduled toileting   Swallow/Nutrition/ Hydration             ADL's             Mobility         evaluation in pm today   Communication             Safety/Cognition/ Behavioral Observations            Pain   Patient denies having pain  Patient will remain pain free  Assess regularly for pain and treat as necessary   Skin   Bilateral leg vascular stasis, sacral redness, bruises and skin tear on bil UE  Keep skin clean and dry, wounds cleaned and protected with dressings as needed  Patient will not have new skin injury or infection     Discharge Planning:      Team Discussion: Evals pending Patient on target to meet rehab goals: Max assist sit - stand and lower body care. Has adaptive equipment set up for home  *See Care Plan and progress notes for long and short-term goals.   Revisions to Treatment Plan:    Teaching  Needs: Transfers, toileting, medication management, skin care, etc.  Current Barriers to Discharge: Decreased caregiver support, Home enviroment access/layout, Wound care, and Weight  Possible Resolutions to Barriers: Family education     Medical Summary Current Status: pt's s/p thoracentesis- still SOB/DOE- multiple skin tears- MASD pannus- still on O2 3L- BiPAP at night- chronic; incontinent- external male catheter- uses bidet at home to clean self.  Barriers to Discharge: Home enviroment access/layout;Decreased family/caregiver support;Medical stability;Weight bearing restrictions;Weight;Wound care;Incontinence  Barriers to Discharge Comments: fissure between  buttocks- not pressure ulcer, MASD, going home with a lot of family- Possible Resolutions to [name] Focus: focus- SOB/DOE- see if can wean O2 at all; venous stasis ulcers and MASD skin care- overall debility- d/c-  3-4 weeks   Continued Need for Acute Rehabilitation Level of Care: The patient requires daily medical management by a physician with specialized training in physical medicine and rehabilitation for the following reasons: Direction of a multidisciplinary physical rehabilitation program to maximize functional independence : Yes Medical management of patient stability for increased activity during participation in an intensive rehabilitation regime.: Yes Analysis of laboratory values and/or radiology reports with any subsequent need for medication adjustment and/or medical intervention. : Yes   I attest that I was present, lead the  team conference, and concur with the assessment and plan of the team.   Margarito Liner 03/03/2021, 2:30 PM

## 2021-03-03 NOTE — Progress Notes (Signed)
Pt educated on and demonstrated use of incentive spirometer and MCD. Pt has no further questions.  Sheela Stack, LPN

## 2021-03-03 NOTE — Plan of Care (Signed)
Problem: RH Balance Goal: LTG: Patient will maintain dynamic sitting balance (OT) Description: LTG:  Patient will maintain dynamic sitting balance with assistance during activities of daily living (OT) 03/03/2021 1617 by Mellissa Kohut, OT Flowsheets (Taken 03/03/2021 1617) LTG: Pt will maintain dynamic sitting balance during ADLs with: Independent with assistive device 03/03/2021 1613 by Mellissa Kohut, OT Flowsheets (Taken 03/03/2021 1613) LTG: Pt will maintain dynamic sitting balance during ADLs with: Supervision/Verbal cueing   Problem: RH Balance Goal: LTG Patient will maintain dynamic standing with ADLs (OT) Description: LTG:  Patient will maintain dynamic standing balance with assist during activities of daily living (OT)  03/03/2021 1617 by Mellissa Kohut, OT Flowsheets (Taken 03/03/2021 1617) LTG: Pt will maintain dynamic standing balance during ADLs with: Contact Guard/Touching assist 03/03/2021 1613 by Mellissa Kohut, OT Flowsheets (Taken 03/03/2021 1613) LTG: Pt will maintain dynamic standing balance during ADLs with: Minimal Assistance - Patient > 75%   Problem: Sit to Stand Goal: LTG:  Patient will perform sit to stand in prep for activites of daily living with assistance level (OT) Description: LTG:  Patient will perform sit to stand in prep for activites of daily living with assistance level (OT) Flowsheets (Taken 03/03/2021 1613) LTG: PT will perform sit to stand in prep for activites of daily living with assistance level: Supervision/Verbal cueing   Problem: RH Grooming Goal: LTG Patient will perform grooming w/assist,cues/equip (OT) Description: LTG: Patient will perform grooming with assist, with/without cues using equipment (OT) Flowsheets (Taken 03/03/2021 1613) LTG: Pt will perform grooming with assistance level of: Set up assist    Problem: RH Bathing Goal: LTG Patient will bathe all body parts with assist levels (OT) Description: LTG: Patient will bathe all  body parts with assist levels (OT) Flowsheets (Taken 03/03/2021 1613) LTG: Pt will perform bathing with assistance level/cueing: Minimal Assistance - Patient > 75%   Problem: RH Dressing Goal: LTG Patient will perform upper body dressing (OT) Description: LTG Patient will perform upper body dressing with assist, with/without cues (OT). Flowsheets (Taken 03/03/2021 1613) LTG: Pt will perform upper body dressing with assistance level of: Independent with assistive device   Problem: RH Dressing Goal: LTG Patient will perform lower body dressing w/assist (OT) Description: LTG: Patient will perform lower body dressing with assist, with/without cues in positioning using equipment (OT) Flowsheets (Taken 03/03/2021 1613) LTG: Pt will perform lower body dressing with assistance level of: Minimal Assistance - Patient > 75%   Problem: RH Toileting Goal: LTG Patient will perform toileting task (3/3 steps) with assistance level (OT) Description: LTG: Patient will perform toileting task (3/3 steps) with assistance level (OT)  Flowsheets (Taken 03/03/2021 1613) LTG: Pt will perform toileting task (3/3 steps) with assistance level: Minimal Assistance - Patient > 75%   Problem: RH Simple Meal Prep Goal: LTG Patient will perform simple meal prep w/assist (OT) Description: LTG: Patient will perform simple meal prep with assistance, with/without cues (OT). Flowsheets (Taken 03/03/2021 1613) LTG: Pt will perform simple meal prep with assistance level of: Independent with assistive device LTG: Pt will perform simple meal prep w/level of: Wheelchair level   Problem: RH Toilet Transfers Goal: LTG Patient will perform toilet transfers w/assist (OT) Description: LTG: Patient will perform toilet transfers with assist, with/without cues using equipment (OT) Flowsheets (Taken 03/03/2021 1613) LTG: Pt will perform toilet transfers with assistance level of: Supervision/Verbal cueing   Problem: RH Furniture  Transfers Goal: LTG Patient will perform furniture transfers w/assist (OT/PT) Description: LTG: Patient will perform furniture  transfers  with assistance (OT/PT). Flowsheets (Taken 03/03/2021 1613) LTG: Pt will perform furniture transfers with assist:: Supervision/Verbal cueing

## 2021-03-03 NOTE — Progress Notes (Signed)
Inpatient Rehabilitation Care Coordinator Assessment and Plan Patient Details  Name: [name] MRN: [id number] Date of Birth: 08-Aug-1941  Today's Date: 03/03/2021  Hospital Problems: Active Problems:   Pressure injury of skin   Debility  Past Medical History:  Past Medical History:  Diagnosis Date   1st degree AV block 11/09/2018   Noted on EKG    Anemia    Aortic stenosis, severe    S/p Edwards Sapien 3 Transcatheter Heart Valve (size 26 mm, model # [id number], serial # [id number])   Arthritis    Back pain    Bursitis    Cataract    left immature   Cellulitis 10/12/2015   CHF (congestive heart failure) (HCC)    Complication of anesthesia    Halucinations   Constipation    takes Miralax daily as well as Senokot daily   DDD (degenerative disc disease)    Gout    takes Allopurinol daily   Heart valve disorder    History of blood clots 1962   knee   History of blood transfusion    no abnormal reaction noted   History of shingles    Hyperlipidemia    takes Atorvastatin daily   Hypertension    takes Lisinopril daily   Low back pain 01/25/2017   Morbid obesity ([place])    OSA (obstructive sleep apnea)    Osteoarthritis    Peripheral neuropathy    takes Gabapentin daily   Peroneal palsy    significant right foot drop   Persistent atrial fibrillation ([place])    Pneumonia 25+yrs ago   hx of   Prostate cancer ([place]) 02/13/2019   RBBB 11/09/2018   Noted on EKG   Thrombocytopenia ([place])    Urinary frequency    Urinary urgency    Valvular heart disease    Venous stasis dermatitis    Past Surgical History:  Past Surgical History:  Procedure Laterality Date   CARDIAC CATHETERIZATION N/A 05/02/2015   Procedure: Right/Left Heart Cath and Coronary Angiography;  Surgeon: Burnell Blanks, MD;  Location: [place] [facility];  Service: Cardiovascular;  Laterality: N/A;   CARDIOVERSION N/A 10/27/2018   Procedure: CARDIOVERSION;  Surgeon: Fay Records, MD;  Location: [facility]  ENDOSCOPY;  Service: Cardiovascular;  Laterality: N/A;   COLONOSCOPY N/A 02/07/2013   Procedure: COLONOSCOPY;  Surgeon: Juanita Craver, MD;  Location: [facility] ENDOSCOPY;  Service: Endoscopy;  Laterality: N/A;   COLONOSCOPY N/A 02/09/2013   Procedure: COLONOSCOPY;  Surgeon: Beryle Beams, MD;  Location: [place];  Service: Endoscopy;  Laterality: N/A;   ESOPHAGOGASTRODUODENOSCOPY N/A 02/07/2013   Procedure: ESOPHAGOGASTRODUODENOSCOPY (EGD);  Surgeon: Juanita Craver, MD;  Location: [facility] ENDOSCOPY;  Service: Endoscopy;  Laterality: N/A;   GIVENS CAPSULE STUDY N/A 02/09/2013   Procedure: GIVENS CAPSULE STUDY;  Surgeon: Beryle Beams, MD;  Location: [place];  Service: Endoscopy;  Laterality: N/A;   HERNIA REPAIR     umbilical hernia   IR THORACENTESIS ASP PLEURAL SPACE W/IMG GUIDE  02/18/2021   KNEE SURGERY Right    multiple knee surgeries due to complication of R TKA   LAMINOTOMY  1193   c6-t2   PACEMAKER IMPLANT N/A 04/27/2018   Procedure: PACEMAKER IMPLANT;  Surgeon: Constance Haw, MD;  Location: [place] [facility];  Service: Cardiovascular;  Laterality: N/A;   PROSTATE BIOPSY N/A 04/12/2019   Procedure: BIOPSY TRANSRECTAL ULTRASONIC PROSTATE (TUBP) CYSTOSCOPY;  Surgeon: [name] Hughs, MD;  Location: [facility];  Service: Urology;  Laterality: N/A;  SPINE SURGERY     TEE WITHOUT CARDIOVERSION N/A 03/07/2015   Procedure: TRANSESOPHAGEAL ECHOCARDIOGRAM (TEE);  Surgeon: Josue Hector, MD;  Location: [facility] ENDOSCOPY;  Service: Cardiovascular;  Laterality: N/A;  ANES TO BRING PROPOFOL PER DOCTOR   TEE WITHOUT CARDIOVERSION N/A 06/10/2015   Procedure: TRANSESOPHAGEAL ECHOCARDIOGRAM (TEE);  Surgeon: Burnell Blanks, MD;  Location: [place];  Service: Open Heart Surgery;  Laterality: N/A;   TOTAL KNEE ARTHROPLASTY     right   x4 left x 1    TRANSCATHETER AORTIC VALVE REPLACEMENT, TRANSFEMORAL Left 06/10/2015   Procedure: TRANSCATHETER AORTIC VALVE REPLACEMENT, TRANSFEMORAL;  Surgeon: Burnell Blanks, MD;  Location: [place];  Service: Open Heart Surgery;  Laterality: Left;   Social History:  reports that he quit smoking about 14 years ago. His smoking use included pipe. He has never used smokeless tobacco. He reports that he does not drink alcohol and does not use drugs.  Family / Support Systems Marital Status: Married Patient Roles: Spouse, Parent, Other (Comment) (Grandparent) Spouse/Significant Other: [name] [phone]  [phone] Children: Jeff-son [phone] Other Supports: Two other son's who are involved Anticipated Caregiver: Family members-wife, son's and granddaughter Ability/Limitations of Caregiver: Wife has some memory issues, one son works during the day and one works from home. Granddaughter is off for the summer Caregiver Availability: 24/7 Family Dynamics: Close knit family who pull together when times of need. They will piece together a care plan for Dad/husband when discharged from hospital.  Social History Preferred language: English Religion: [employer] Cultural Background: No issues Education: HS Read: Yes Write: Yes Employment Status: Retired Public relations account executive Issues: No issues Guardian/Conservator: None-according to MD pt is capable of making his own decisions while here,   Abuse/Neglect Abuse/Neglect Assessment Can Be Completed: Yes Physical Abuse: Denies Verbal Abuse: Denies Sexual Abuse: Denies Exploitation of patient/patient's resources: Denies Self-Neglect: Denies  Emotional Status Pt's affect, behavior and adjustment status: Pt is motivated to get moving and wanted to at the other hospital he was at. He told them he would get weak and not be mobile if he didn't. He has a routine at home he wants to get back to. Recent Psychosocial Issues: other health issues and recetn PNA has weaken him Psychiatric History: No history deferred depression due to new admit and still adjusting the rehab and first day today. He is one who  will verbalize his concerns and questions Substance Abuse History: No issues  Patient / Family Perceptions, Expectations & Goals Pt/Family understanding of illness & functional limitations: Pt and family can explain his health issues and reason for hospitalization. 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, father, Radiographer, therapeutic, retiree, neighbor, etc Anticipated changes in roles/activities/participation: resume Pt/family expectations/goals: Pt states: " I want to get back to being mobile and my rountine."  Wife states: " I hope he can be mobile that is what we need to be at discharge."  [employer]: None Premorbid Home Care/DME Agencies: Other (Comment) (has scooter, b-platform rw, lift chair and elevated toliet) Transportation available at discharge: Son  Discharge Planning Living Arrangements: Spouse/significant other, Children Support Systems: Spouse/significant other, Children, Other relatives, Friends/neighbors, Social worker community Type of Residence: Private residence Insurance Resources: Multimedia programmer (specify) Doctor, general practice) Financial Resources: Radio broadcast assistant Screen Referred: No Living Expenses: Own Money Management: Patient, Spouse Does the patient have any problems obtaining your medications?:  No Home Management: Family Patient/Family Preliminary Plans: Return home with wife and son, between all of family they can provide 24/7 short term at discharge. Will await team's evaluations and work on any discharge needs. Care Coordinator Barriers to Discharge: Weight Care Coordinator Anticipated Follow Up Needs: HH/OP  Clinical Impression:  Motivated gentleman who is wanting to get therapies so he can be mobile again and using his platform walker, he has in the room. Wife and son present and very supportive. Will  have 24/7 short time between sons, granddaughter and wife. Await team's evaluations today.   Elease Hashimoto 03/03/2021, 10:04 AM

## 2021-03-03 NOTE — Evaluation (Addendum)
Physical Therapy Assessment and Plan  Patient Details  Name: [name] MRN: [id number] Date of Birth: 20-Oct-1940  PT Diagnosis: Abnormality of gait, Difficulty walking, and Muscle weakness Rehab Potential: Good ELOS: 3-4 weeks   Today's Date: 03/03/2021 PT Individual Time: 1700-1749 PT Individual Time Calculation (min): 75 min    Hospital Problem: Active Problems:   Pressure injury of skin   Debility   Past Medical History:  Past Medical History:  Diagnosis Date   1st degree AV block 11/09/2018   Noted on EKG    Anemia    Aortic stenosis, severe    S/p Edwards Sapien 3 Transcatheter Heart Valve (size 26 mm, model # [id number], serial # [id number])   Arthritis    Back pain    Bursitis    Cataract    left immature   Cellulitis 10/12/2015   CHF (congestive heart failure) (HCC)    Complication of anesthesia    Halucinations   Constipation    takes Miralax daily as well as Senokot daily   DDD (degenerative disc disease)    Gout    takes Allopurinol daily   Heart valve disorder    History of blood clots 1962   knee   History of blood transfusion    no abnormal reaction noted   History of shingles    Hyperlipidemia    takes Atorvastatin daily   Hypertension    takes Lisinopril daily   Low back pain 01/25/2017   Morbid obesity (HCC)    OSA (obstructive sleep apnea)    Osteoarthritis    Peripheral neuropathy    takes Gabapentin daily   Peroneal palsy    significant right foot drop   Persistent atrial fibrillation ([place])    Pneumonia 25+yrs ago   hx of   Prostate cancer ([place]) 02/13/2019   RBBB 11/09/2018   Noted on EKG   Thrombocytopenia ([place])    Urinary frequency    Urinary urgency    Valvular heart disease    Venous stasis dermatitis    Past Surgical History:  Past Surgical History:  Procedure Laterality Date   CARDIAC CATHETERIZATION N/A 05/02/2015   Procedure: Right/Left Heart Cath and Coronary Angiography;  Surgeon: Burnell Blanks, MD;   Location: [place] [facility];  Service: Cardiovascular;  Laterality: N/A;   CARDIOVERSION N/A 10/27/2018   Procedure: CARDIOVERSION;  Surgeon: Fay Records, MD;  Location: [facility] ENDOSCOPY;  Service: Cardiovascular;  Laterality: N/A;   COLONOSCOPY N/A 02/07/2013   Procedure: COLONOSCOPY;  Surgeon: Juanita Craver, MD;  Location: [facility] ENDOSCOPY;  Service: Endoscopy;  Laterality: N/A;   COLONOSCOPY N/A 02/09/2013   Procedure: COLONOSCOPY;  Surgeon: Beryle Beams, MD;  Location: [place];  Service: Endoscopy;  Laterality: N/A;   ESOPHAGOGASTRODUODENOSCOPY N/A 02/07/2013   Procedure: ESOPHAGOGASTRODUODENOSCOPY (EGD);  Surgeon: Juanita Craver, MD;  Location: [facility] ENDOSCOPY;  Service: Endoscopy;  Laterality: N/A;   GIVENS CAPSULE STUDY N/A 02/09/2013   Procedure: GIVENS CAPSULE STUDY;  Surgeon: Beryle Beams, MD;  Location: [place];  Service: Endoscopy;  Laterality: N/A;   HERNIA REPAIR     umbilical hernia   IR THORACENTESIS ASP PLEURAL SPACE W/IMG GUIDE  02/18/2021   KNEE SURGERY Right    multiple knee surgeries due to complication of R TKA   LAMINOTOMY  1193   c6-t2   PACEMAKER IMPLANT N/A 04/27/2018   Procedure: PACEMAKER IMPLANT;  Surgeon: Constance Haw, MD;  Location: [place] [facility];  Service: Cardiovascular;  Laterality: N/A;  PROSTATE BIOPSY N/A 04/12/2019   Procedure: BIOPSY TRANSRECTAL ULTRASONIC PROSTATE (TUBP) CYSTOSCOPY;  Surgeon: [name] Hughs, MD;  Location: [facility];  Service: Urology;  Laterality: N/A;   SPINE SURGERY     TEE WITHOUT CARDIOVERSION N/A 03/07/2015   Procedure: TRANSESOPHAGEAL ECHOCARDIOGRAM (TEE);  Surgeon: Josue Hector, MD;  Location: [facility] ENDOSCOPY;  Service: Cardiovascular;  Laterality: N/A;  ANES TO BRING PROPOFOL PER DOCTOR   TEE WITHOUT CARDIOVERSION N/A 06/10/2015   Procedure: TRANSESOPHAGEAL ECHOCARDIOGRAM (TEE);  Surgeon: Burnell Blanks, MD;  Location: [place];  Service: Open Heart Surgery;  Laterality: N/A;   TOTAL KNEE ARTHROPLASTY     right   x4  left x 1    TRANSCATHETER AORTIC VALVE REPLACEMENT, TRANSFEMORAL Left 06/10/2015   Procedure: TRANSCATHETER AORTIC VALVE REPLACEMENT, TRANSFEMORAL;  Surgeon: Burnell Blanks, MD;  Location: [place];  Service: Open Heart Surgery;  Laterality: Left;    Assessment & Plan Clinical Impression: Patient is a 80 y.o. male recently admitted to [facility] (02/12/21-02/14/21) with acute on chronic respiratory failure, now readmitted 02/14/21 due to weakness and SOB, unable to stand up from toilet requiring EMS call. Found to be hypoxic. Workup for acute on chronic hypoxic respiratory failure, likely combination of acute on chronic dHF and PNA. Chest CT with L pleural effusions; s/p thoracentesis. Pt also with physiological tremor, likely secondary to spinal myoclonus; CT showed partially calcified meningioma in posterior frontal left>right side of falx with mass effect on medial frontal lobe with  mild edema. Neurology consulted and felt that symptoms due to cervical stenosis. PMH includes OSA, HTN, obesity, afib, CHF, venous stasis dermatitis. Patient transferred to CIR on 03/02/2021 .    Patient currently requires total with mobility secondary to muscle weakness and muscle joint tightness, decreased cardiorespiratoy endurance and decreased oxygen support, and decreased sitting balance, decreased standing balance, and decreased postural control.  Prior to hospitalization, patient was modified independent  with mobility and lived with Spouse, Son in a House home.  Home access is  Ramped entrance (lift ramp).  Patient will benefit from skilled PT intervention to maximize safe functional mobility, minimize fall risk, and decrease caregiver burden for planned discharge home with 24 hour supervision.  Anticipate patient will benefit from follow up [place] at discharge.  PT - End of Session Activity Tolerance: Decreased this session;Tolerates 30+ min activity with multiple rests Endurance Deficit: Yes Endurance Deficit  Description: hypoxic and SOB after standing and sit to supine on 2L SpO2 87% PT Assessment Rehab Potential (ACUTE/IP ONLY): Good PT Barriers to Discharge: Decreased caregiver support;Lack of/limited family support;Weight;New oxygen PT Barriers to Discharge Comments: wife recently fell and now using rollator, son works 10-7 4 days a week PT Patient demonstrates impairments in the following area(s): Balance;Endurance;Motor;Safety;Sensory PT Transfers Functional Problem(s): Bed to Chair;Car;Furniture;Bed Mobility;Floor PT Locomotion Functional Problem(s): Ambulation;Wheelchair Mobility;Other (comment) PT Plan PT Intensity: Minimum of 1-2 x/day ,45 to 90 minutes PT Frequency: 5 out of 7 days PT Duration Estimated Length of Stay: 3-4 weeks PT Treatment/Interventions: Ambulation/gait training;Balance/vestibular training;Discharge planning;DME/adaptive equipment instruction;Functional mobility training;Splinting/orthotics;Therapeutic Activities;UE/LE Strength taining/ROM;Wheelchair propulsion/positioning;UE/LE Coordination activities;Therapeutic Exercise;Patient/family education;Neuromuscular re-education;Disease management/prevention;Community reintegration PT Transfers Anticipated Outcome(s): S fom higher sufrace PT Locomotion Anticipated Outcome(s): Ambulating short household distance with S w/ B PFRW PT Recommendation Follow Up Recommendations: Home health PT;24 hour supervision/assistance Patient destination: Home Equipment Recommended: None recommended by PT   PT Evaluation Precautions/Restrictions Precautions Precautions: Fall;Other (comment) Precaution Comments: Watch SpO2, foley catheter Required Braces or Orthoses: Other Brace Other Brace: Has bilateral  knee braces/compression wrap (for stability); left knee hinged brace then places elastic sleeve over brace; rt knee prefers ace wrap tightly for support Restrictions Weight Bearing Restrictions: No General   Vital SignsTherapy  Vitals Temp: 99.1 F (37.3 C) Temp Source: Oral Pulse Rate: 75 Resp: 16 BP: 112/76 Patient Position (if appropriate): Sitting Oxygen Therapy SpO2: 97 % O2 Device: Room Air O2 Flow Rate (L/min): 2 L/min Pain Pain Assessment Pain Scale: 0-10 Pain Score: 0-No pain Home Living/Prior Functioning Home Living Available Help at Discharge: Family;Available 24 hours/day Type of Home: House Home Access: Ramped entrance (lift ramp) Home Layout: One level Bathroom Shower/Tub: Other (comment);Walk-in shower (sponge bathes on toilet) Bathroom Toilet: Handicapped height Bathroom Accessibility: Yes Additional Comments: has a lift chair  Lives With: Spouse;Son Prior Function Level of Independence: Needs assistance with ADLs;Needs assistance with homemaking;Requires assistive device for independence Bath: Minimal Toileting: Moderate Dressing: Moderate Driving: Yes 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 Lift chair at home Vision/Perception  Vision - Assessment Additional Comments: Wearing glasses throughout session. Perception Perception: Within Functional Limits Praxis Praxis: Intact  Cognition Overall Cognitive Status: Within Functional Limits for tasks assessed Arousal/Alertness: Awake/alert Orientation Level: Oriented X4 Attention: Sustained;Divided Sustained Attention: Appears intact Divided Attention: Appears intact Memory: Impaired Memory Impairment: Retrieval deficit;Decreased recall of new information Immediate Memory Recall: Sock;Blue;Bed Memory Recall Sock: Not able to recall (said "shoe" with cue) Memory Recall Blue: Without Cue Memory Recall Bed: Not able to recall Awareness: Appears intact Problem Solving: Appears intact Executive Function: Reasoning Reasoning: Appears intact Safety/Judgment: Appears intact Sensation Sensation Light Touch: Impaired  by gross assessment Proprioception: Impaired by gross assessment Stereognosis: Appears Intact Additional Comments: decreased on R LE as compared to L and reports has been numb and weaker since TKA surgeries Coordination Gross Motor Movements are Fluid and Coordinated: No Fine Motor Movements are Fluid and Coordinated: Yes Coordination and Movement Description: GM movements limited due to BLE weakness, deconditioning, and body habitus Motor  Motor Motor: Other (comment) Motor - Skilled Clinical Observations: Generalized deconditioning and BLE weakness   Trunk/Postural Assessment  Cervical Assessment Cervical Assessment: Exceptions to [facility] (forward head) Thoracic Assessment Thoracic Assessment: Exceptions to [facility] (rounded shoulders) Lumbar Assessment Lumbar Assessment: Exceptions to [facility] (posterior pelvic tilt) Postural Control Postural Control: Deficits on evaluation Postural Limitations: Difficulties with trunk control to come into sitting EOB likely due to generalized weakness, SOB/fatigue, and body habitus  Balance Balance Balance Assessed: Yes Static Sitting Balance Static Sitting - Balance Support: No upper extremity supported;Feet supported Static Sitting - Level of Assistance: 5: Stand by assistance Dynamic Sitting Balance Dynamic Sitting - Balance Support: Feet supported;Left upper extremity supported;Right upper extremity supported;During functional activity Dynamic Sitting - Level of Assistance: 5: Stand by assistance Dynamic Sitting - Balance Activities: Lateral lean/weight shifting Sitting balance - Comments: leaning to scoot hips Static Standing Balance Static Standing - Balance Support: Bilateral upper extremity supported Static Standing - Level of Assistance: 3: Mod assist Static Standing - Comment/# of Minutes: 2 minutes with A for safety Extremity Assessment  RUE Assessment RUE Assessment: Exceptions to [facility] Active Range of Motion (AROM) Comments: WFL for  functional tasks with use of AE for compensation, but AROM shoulder FLX to ~125 degrees General Strength Comments: WFL for functional tasks LUE Assessment LUE Assessment: Exceptions to [facility] Active Range of Motion (AROM) Comments: Decreased AROM for shoulder FLX to 80-90 degrees General Strength  Comments: WFL for functional tasks assessed RLE Assessment RLE Assessment: Exceptions to [facility] Passive Range of Motion (PROM) Comments: limited due to body habius and multiple knee surgeries; can flex approximately to 60 in supine with assist and noted tight hip external rotators Active Range of Motion (AROM) Comments: limited ankle and toe AROM reports numbness and weakness since knee surgeries General Strength Comments: hip flexion 3-/5, knee extension 3+/5, ankle DF 2-/5 LLE Assessment LLE Assessment: Exceptions to [facility] Passive Range of Motion (PROM) Comments: Limited due to body habitus, but generally WFL hip, limited at knee due to pain with knee brace on, ankle DF limited to about neutral Active Range of Motion (AROM) Comments: limited by LE weakness General Strength Comments: hip flexion 3/5, knee extension 4/5, ankle DF 3-/5  Care Tool Care Tool Bed Mobility Roll left and right activity   Roll left and right assist level: Maximal Assistance - Patient 25 - 49%    Sit to lying activity   Sit to lying assist level: 2 Helpers    Lying to sitting edge of bed activity   Lying to sitting edge of bed assist level: 2 Helpers     Care Tool Transfers Sit to stand transfer   Sit to stand assist level: 2 Helpers    Chair/bed transfer Chair/bed transfer activity did not occur: Safety/medical concerns       Toilet transfer Toilet transfer activity did not occur: Safety/medical concerns      Scientist, product/process development transfer activity did not occur: Safety/medical concerns        Care Tool Locomotion Ambulation Ambulation activity did not occur: Safety/medical concerns        Walk 10 feet activity  Walk 10 feet activity did not occur: Safety/medical concerns       Walk 50 feet with 2 turns activity Walk 50 feet with 2 turns activity did not occur: Safety/medical concerns      Walk 150 feet activity Walk 150 feet activity did not occur: Safety/medical concerns      Walk 10 feet on uneven surfaces activity Walk 10 feet on uneven surfaces activity did not occur: Safety/medical concerns      Stairs Stair activity did not occur: Safety/medical concerns        Walk up/down 1 step activity Walk up/down 1 step or curb (drop down) activity did not occur: Safety/medical concerns     Walk up/down 4 steps activity did not occuR: Safety/medical concerns  Walk up/down 4 steps activity      Walk up/down 12 steps activity Walk up/down 12 steps activity did not occur: Safety/medical concerns      Pick up small objects from floor Pick up small object from the floor (from standing position) activity did not occur: Safety/medical concerns      Wheelchair Will patient use wheelchair at discharge?: Yes Type of Wheelchair: Power Wheelchair activity did not occur: Safety/medical concerns      Wheel 50 feet with 2 turns activity Wheelchair 50 feet with 2 turns activity did not occur: Safety/medical concerns    Wheel 150 feet activity Wheelchair 150 feet activity did not occur: Safety/medical concerns      Refer to Care Plan for Long Term Goals  SHORT TERM GOAL WEEK 1 PT Short Term Goal 1 (Week 1): Patient to perform supine to sit with mod A of 1. PT Short Term Goal 2 (Week 1): Patient to perform sit to stand from higher surface with mod A of 2 for safety x 3  reps PT Short Term Goal 3 (Week 1): Patient to perform stand step to shuttle chair with PFRW and +2 A PT Short Term Goal 4 (Week 1): Patient to perform sit to supine with mod A of 2  Recommendations for other services: None   Skilled Therapeutic Intervention Patient in supine and able to report specifically mobility at home prior  to admission.  Son and wife in the room and also able to provide report of patient's prior status.  Patient without shorts on and reports previous session with OT unable to stand enough to pull up pants.  Patient assisted to thread legs into shorts, then performed rolling in bed with mod to A for pulling up as much as able.  Supine to sit with mod A +2 for safety.  Scooting to EOB with mod A leaning lateral to help pull out hips.  Patient sit to stand from elevated height to B PFRW with +2 mod A.  Standing for about 2 minutes while pulling up shorts and attempting to initiate stepping or weight shifting, but pt unable as reports feeling legs too weak to hold individually.  On 2L O2 throughout session noted SpO2 86% after standing and back to 91% after seated rest about 1 minute.  Patient stand to sit with mod A for safety.  Attempted 2 more times to stand, but pt unable reporting legs too fatigued.  Lateral leans to place pad under hips to attempt to help with lateral scooting, but unable after three attempts with +2 A.  Sit to supine +2 total A for LE's and scooting trunk.  Scooted up to [facility] with +2 max A with bed in trendelenberg.  Patient returned to sitting upright and pulled up on rails to straighten pads under pt.  Rolled R and L with mod A for repositioning pads under hips.  Patient left in supine with wife and son in the room and call bell/bed control in reach.  Obtained shuttle chair for room and discussed with nursing possibly moving to larger room.  Mobility Bed Mobility Bed Mobility: Rolling Right;Rolling Left;Supine to Sit;Sit to Supine;Scooting to [facility] Rolling Right: Moderate Assistance - Patient 50-74% Rolling Left: Moderate Assistance - Patient 50-74% Supine to Sit: 2 Helpers;Moderate Assistance - Patient 50-74% Sit to Supine: 2 Helpers;Total Assistance - Patient < 25% Scooting to HOB: 2 Helpers;Total Assistance - Patient < 25% Transfers Transfers: Sit to Stand;Stand to Sit Sit to Stand: 2  Helpers Stand to Sit: 2 Helpers Transfer (Assistive device): Bilateral platform walker Locomotion  Gait Ambulation: No Gait Gait: No Stairs / Additional Locomotion Stairs: No Wheelchair Mobility Wheelchair Mobility: No (limited due to inability to transfer safely)   Discharge Criteria: Patient will be discharged from PT if patient refuses treatment 3 consecutive times without medical reason, if treatment goals not met, if there is a change in medical status, if patient makes no progress towards goals or if patient is discharged from hospital.  The above assessment, treatment plan, treatment alternatives and goals were discussed and mutually agreed upon: by patient and by family  Jamison Oka, PT 03/03/2021, 4:40 PM

## 2021-03-03 NOTE — Progress Notes (Signed)
Patient ID: [name], male   DOB: 1941-05-17, 80 y.o.   MRN: [id number] Met with the patient to introduce self and role of the nurse CM. Discussed hx and problems limiting function. Patient concerned about skin issues not present PTA and working out a system for elimination that  is similar to his routine at home. Reports has a lift chair and elevated toilet and elevated bed at home. Skin is worse since admission than PTA and was using deodorant tween  pannis skin at home as powder cakes now skin is irritated and bleeding. Interdry applied to area. Continue to follow along to discharge to address educational needs and skin care. Collaborate with the SW to facilitate preparation for discharge. Margarito Liner, RN

## 2021-03-03 NOTE — Plan of Care (Signed)
  Problem: RH Furniture Transfers Goal: LTG Patient will perform furniture transfers w/assist (OT/PT) Description: LTG: Patient will perform furniture transfers  with assistance (OT/PT). Flowsheets (Taken 03/03/2021 1653) LTG: Pt will perform furniture transfers with assist:: Supervision/Verbal cueing   Problem: RH Balance Goal: LTG Patient will maintain dynamic sitting balance (PT) Description: LTG:  Patient will maintain dynamic sitting balance with assistance during mobility activities (PT) Flowsheets (Taken 03/03/2021 1653) LTG: Pt will maintain dynamic sitting balance during mobility activities with:: Supervision/Verbal cueing Goal: LTG Patient will maintain dynamic standing balance (PT) Description: LTG:  Patient will maintain dynamic standing balance with assistance during mobility activities (PT) Flowsheets (Taken 03/03/2021 1653) LTG: Pt will maintain dynamic standing balance during mobility activities with:: Contact Guard/Touching assist   Problem: Sit to Stand Goal: LTG:  Patient will perform sit to stand with assistance level (PT) Description: LTG:  Patient will perform sit to stand with assistance level (PT) Flowsheets (Taken 03/03/2021 1653) LTG: PT will perform sit to stand in preparation for functional mobility with assistance level: Supervision/Verbal cueing   Problem: RH Bed Mobility Goal: LTG Patient will perform bed mobility with assist (PT) Description: LTG: Patient will perform bed mobility with assistance, with/without cues (PT). Flowsheets (Taken 03/03/2021 1653) LTG: Pt will perform bed mobility with assistance level of: Supervision/Verbal cueing   Problem: RH Bed to Chair Transfers Goal: LTG Patient will perform bed/chair transfers w/assist (PT) Description: LTG: Patient will perform bed to chair transfers with assistance (PT). Flowsheets (Taken 03/03/2021 1653) LTG: Pt will perform Bed to Chair Transfers with assistance level: Supervision/Verbal cueing    Problem: RH Ambulation Goal: LTG Patient will ambulate in controlled environment (PT) Description: LTG: Patient will ambulate in a controlled environment, # of feet with assistance (PT). Flowsheets (Taken 03/03/2021 1653) LTG: Pt will ambulate in controlled environ  assist needed:: Supervision/Verbal cueing LTG: Ambulation distance in controlled environment: 25' Goal: LTG Patient will ambulate in home environment (PT) Description: LTG: Patient will ambulate in home environment, # of feet with assistance (PT). Flowsheets (Taken 03/03/2021 1653) LTG: Pt will ambulate in home environ  assist needed:: Supervision/Verbal cueing LTG: Ambulation distance in home environment: 25'   Problem: RH Wheelchair Mobility Goal: LTG Patient will propel w/c in controlled environment (PT) Description: LTG: Patient will propel wheelchair in controlled environment, # of feet with assist (PT) Flowsheets (Taken 03/03/2021 1653) LTG: Pt will propel w/c in controlled environ  assist needed:: Supervision/Verbal cueing LTG: Propel w/c distance in controlled environment: 150' in power w/c from home  Magda Kiel, PT

## 2021-03-03 NOTE — Progress Notes (Signed)
Patient information reviewed and entered into [facility] by Becky Rever Pichette, PPS coordinator. Information including medical coding, function ability, and quality indicators will be reviewed and updated through discharge.   

## 2021-03-03 NOTE — Evaluation (Signed)
Occupational Therapy Assessment and Plan  Patient Details  Name: [name] MRN: [id number] Date of Birth: [birth year]  OT Diagnosis: cognitive deficits and muscle weakness (generalized) Rehab Potential: Rehab Potential (ACUTE ONLY): Good ELOS: 3-4 weeks depending on progression towards goals   Today's Date: 03/03/2021 OT Individual Time: 8891-6945 and 1445-1530 OT Individual Time Calculation (min): 93 min  and 45 min  Hospital Problem: Active Problems:   Pressure injury of skin   Debility   Past Medical History:  Past Medical History:  Diagnosis Date   1st degree AV block 11/09/2018   Noted on EKG    Anemia    Aortic stenosis, severe    S/p Edwards Sapien 3 Transcatheter Heart Valve (size 26 mm, model # [id number], serial # [id number])   Arthritis    Back pain    Bursitis    Cataract    left immature   Cellulitis 10/12/2015   CHF (congestive heart failure) (HCC)    Complication of anesthesia    Halucinations   Constipation    takes Miralax daily as well as Senokot daily   DDD (degenerative disc disease)    Gout    takes Allopurinol daily   Heart valve disorder    History of blood clots 1962   knee   History of blood transfusion    no abnormal reaction noted   History of shingles    Hyperlipidemia    takes Atorvastatin daily   Hypertension    takes Lisinopril daily   Low back pain 01/25/2017   Morbid obesity (HCC)    OSA (obstructive sleep apnea)    Osteoarthritis    Peripheral neuropathy    takes Gabapentin daily   Peroneal palsy    significant right foot drop   Persistent atrial fibrillation ([place])    Pneumonia 25+yrs ago   hx of   Prostate cancer ([place]) 02/13/2019   RBBB 11/09/2018   Noted on EKG   Thrombocytopenia ([place])    Urinary frequency    Urinary urgency    Valvular heart disease    Venous stasis dermatitis    Past Surgical History:  Past Surgical History:  Procedure Laterality Date   CARDIAC CATHETERIZATION N/A 05/02/2015   Procedure:  Right/Left Heart Cath and Coronary Angiography;  Surgeon: Burnell Blanks, MD;  Location: [facility];  Service: Cardiovascular;  Laterality: N/A;   CARDIOVERSION N/A 10/27/2018   Procedure: CARDIOVERSION;  Surgeon: Fay Records, MD;  Location: [facility] ENDOSCOPY;  Service: Cardiovascular;  Laterality: N/A;   COLONOSCOPY N/A 02/07/2013   Procedure: COLONOSCOPY;  Surgeon: Juanita Craver, MD;  Location: [facility] ENDOSCOPY;  Service: Endoscopy;  Laterality: N/A;   COLONOSCOPY N/A 02/09/2013   Procedure: COLONOSCOPY;  Surgeon: Beryle Beams, MD;  Location: [place];  Service: Endoscopy;  Laterality: N/A;   ESOPHAGOGASTRODUODENOSCOPY N/A 02/07/2013   Procedure: ESOPHAGOGASTRODUODENOSCOPY (EGD);  Surgeon: Juanita Craver, MD;  Location: [facility] ENDOSCOPY;  Service: Endoscopy;  Laterality: N/A;   GIVENS CAPSULE STUDY N/A 02/09/2013   Procedure: GIVENS CAPSULE STUDY;  Surgeon: Beryle Beams, MD;  Location: [place];  Service: Endoscopy;  Laterality: N/A;   HERNIA REPAIR     umbilical hernia   IR THORACENTESIS ASP PLEURAL SPACE W/IMG GUIDE  02/18/2021   KNEE SURGERY Right    multiple knee surgeries due to complication of R TKA   LAMINOTOMY  1193   c6-t2   PACEMAKER IMPLANT N/A 04/27/2018   Procedure: PACEMAKER IMPLANT;  Surgeon: Constance Haw, MD;  Location: [facility]  INVASIVE CV LAB;  Service: Cardiovascular;  Laterality: N/A;   PROSTATE BIOPSY N/A 04/12/2019   Procedure: BIOPSY TRANSRECTAL ULTRASONIC PROSTATE (TUBP) CYSTOSCOPY;  Surgeon: [name] Hughs, MD;  Location: [facility];  Service: Urology;  Laterality: N/A;   SPINE SURGERY     TEE WITHOUT CARDIOVERSION N/A 03/07/2015   Procedure: TRANSESOPHAGEAL ECHOCARDIOGRAM (TEE);  Surgeon: Josue Hector, MD;  Location: [facility] ENDOSCOPY;  Service: Cardiovascular;  Laterality: N/A;  ANES TO BRING PROPOFOL PER DOCTOR   TEE WITHOUT CARDIOVERSION N/A 06/10/2015   Procedure: TRANSESOPHAGEAL ECHOCARDIOGRAM (TEE);  Surgeon: Burnell Blanks, MD;  Location: [place];   Service: Open Heart Surgery;  Laterality: N/A;   TOTAL KNEE ARTHROPLASTY     right   x4 left x 1    TRANSCATHETER AORTIC VALVE REPLACEMENT, TRANSFEMORAL Left 06/10/2015   Procedure: TRANSCATHETER AORTIC VALVE REPLACEMENT, TRANSFEMORAL;  Surgeon: Burnell Blanks, MD;  Location: [place];  Service: Open Heart Surgery;  Laterality: Left;    Assessment & Plan Clinical Impression: Patient is a 80 y.o. male recently admitted [facility] (02/12/21-02/14/21) with acute on chronic respiratory failure, now readmitted 02/14/21 due to weakness and SOB, unable to stand up from toilet requiring EMS call. Found to be hypoxic. Workup for acute on chronic hypoxic respiratory failure, likely combination of acute on chronic dHF and PNA. Chest CT with L pleural effusions; s/p thoracentesis. Pt also with physiological tremor, likely secondary to spinal myoclonus; CT showed partially calcified meningioma in posterior frontal left>right side of falx with mass effect on medial frontal lobe with  mild edema. Neurology consulted and felt that symptoms due to cervical stenosis. PMH includes OSA, HTN, obesity, afib, CHF, venous stasis dermatitis. Patient transferred to CIR on 03/02/2021 .    Patient currently requires max with LB basic self-care skills and min UB basic self-care skills secondary to muscle weakness and muscle joint tightness, decreased cardiorespiratoy endurance and decreased oxygen support, decreased memory, and decreased sitting balance, decreased standing balance, and decreased balance strategies.  Prior to hospitalization, patient could complete BADLs with min A.  Patient will benefit from skilled intervention to decrease level of assist with basic self-care skills, increase independence with basic self-care skills, and increase level of independence with iADL prior to discharge home with care partner.  Anticipate patient will require intermittent supervision and minimal physical assistance and follow up home  health.  OT - End of Session Activity Tolerance: Tolerates 30+ min activity with multiple rests Endurance Deficit: Yes Endurance Deficit Description: SOB and fatigued during seated adls EOB; increased SOB/holding breath during sit<>stand with platform walker OT Assessment Rehab Potential (ACUTE ONLY): Good OT Barriers to Discharge: Home environment access/layout;Weight;New oxygen OT Barriers to Discharge Comments: While pt's home is set up well for using necessary AD, may have more challenging time using platform walker due to endurance deficits in tight spaces at home at time of eval OT Patient demonstrates impairments in the following area(s): Balance;Safety;Cognition;Sensory;Skin Integrity;Edema;Endurance;Nutrition OT Basic ADL's Functional Problem(s): Grooming;Bathing;Dressing;Toileting OT Advanced ADL's Functional Problem(s): Simple Meal Preparation OT Transfers Functional Problem(s): Toilet;Tub/Shower OT Additional Impairment(s): None OT Plan OT Intensity: Minimum of 1-2 x/day, 45 to 90 minutes OT Frequency: 5 out of 7 days OT Duration/Estimated Length of Stay: 3-4 weeks depending on progression towards goals OT Treatment/Interventions: Balance/vestibular training;Discharge planning;Self Care/advanced ADL retraining;Therapeutic Activities;UE/LE Coordination activities;Disease mangement/prevention;Functional mobility training;Patient/family education;Skin care/wound managment;Therapeutic Exercise;Community reintegration;DME/adaptive equipment instruction;Psychosocial support;UE/LE Strength taining/ROM OT Self Feeding Anticipated Outcome(s): set up A OT Basic Self-Care Anticipated Outcome(s): min A using AE  PRN OT Toileting Anticipated Outcome(s): mod A using AE PRN OT Bathroom Transfers Anticipated Outcome(s): min A OT Recommendation Recommendations for Other Services: Therapeutic Recreation consult Therapeutic Recreation Interventions: Stress management;Outing/community  reintergration Patient destination: Home Follow Up Recommendations: Home health OT Equipment Recommended: 3 in 1 bedside comode;To be determined Equipment Details: Bariatric 3:1; likely no shower equipment needed due to pt sponge baths   OT Evaluation Precautions/Restrictions  Precautions Precautions: Fall;Other (comment) Precaution Comments: Watch SpO2, foley catheter Required Braces or Orthoses: Other Brace Other Brace: Has bilateral knee braces/compression wrap (for stability); left knee hinged brace then places elastic sleeve over brace; rt knee prefers ace wrap tightly for support Restrictions Weight Bearing Restrictions: No General Chart Reviewed: Yes Response to Previous Treatment: Patient reporting fatigue but able to participate Family/Caregiver Present: Yes Vital Signs Therapy Vitals Temp: 99.1 F (37.3 C) Temp Source: Oral Pulse Rate: 75 Resp: 16 BP: 112/76 Patient Position (if appropriate): Sitting Oxygen Therapy SpO2: 97 % O2 Device: Room Air O2 Flow Rate (L/min): 2 L/min Pain Pain Assessment Pain Scale: 0-10 Pain Score: 0-No pain Home Living/Prior Functioning Home Living Family/patient expects to be discharged to:: Private residence Living Arrangements: Spouse/significant other, Children Available Help at Discharge: Family, Available 24 hours/day Type of Home: House Home Access: Ramped entrance (lift ramp) Home Layout: One level Bathroom Shower/Tub: Other (comment), Walk-in shower (sponge bathes on toilet) Bathroom Toilet: Handicapped height Bathroom Accessibility: Yes Additional Comments: has a lift chair  Lives With: Spouse, Son IADL History Current License: Yes Mode of Transportation: Musician 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 Lift chair at home Vision Baseline Vision/History: Wears glasses Wears Glasses: At all times Patient Visual Report: No change from baseline Vision Assessment?: No apparent visual deficits Additional Comments: Wearing glasses throughout session. Perception  Perception: Within Functional Limits Praxis Praxis: Intact Cognition Overall Cognitive Status: Within Functional Limits for tasks assessed Arousal/Alertness: Awake/alert Orientation Level: Person;Place;Situation Person: Oriented Place: Oriented Situation: Oriented Year: 2022 Month: June Day of Week: Correct Memory: Impaired Memory Impairment: Retrieval deficit;Decreased recall of new information Immediate Memory Recall: Sock;Blue;Bed Memory Recall Sock: Not able to recall (said "shoe" with cue) Memory Recall Blue: Without Cue Memory Recall Bed: Not able to recall Attention: Sustained;Divided Sustained Attention: Appears intact Divided Attention: Appears intact Awareness: Appears intact Problem Solving: Appears intact Executive Function: Reasoning Reasoning: Appears intact Safety/Judgment: Appears intact Sensation Sensation Light Touch: Impaired by gross assessment Proprioception: Impaired by gross assessment Stereognosis: Appears Intact Additional Comments: decreased on R LE as compared to L and reports has been numb and weaker since TKA surgeries Coordination Gross Motor Movements are Fluid and Coordinated: No Fine Motor Movements are Fluid and Coordinated: Yes Coordination and Movement Description: GM movements limited due to BLE weakness, deconditioning, and body habitus Motor  Motor Motor: Other (comment) Motor - Skilled Clinical Observations: Generalized deconditioning and BLE weakness  Trunk/Postural Assessment  Cervical  Assessment Cervical Assessment: Within Functional Limits Thoracic Assessment Thoracic Assessment: Exceptions to [facility] (rounded shoulders) Lumbar Assessment Lumbar Assessment: Exceptions to [facility] (posterior pelvic tilt) Postural Control Postural Control: Deficits on evaluation Postural Limitations: Difficulties with trunk control to come into sitting EOB likely due to generalized weakness, SOB/fatigue, and body habitus  Balance Balance Balance Assessed: Yes Static Sitting Balance Static Sitting - Balance Support: No upper extremity supported;Feet supported Static Sitting - Level of Assistance: 5: Stand by assistance Dynamic Sitting Balance Dynamic Sitting - Balance Support: Feet supported;Left upper extremity supported;Right upper extremity supported;During functional activity Dynamic Sitting - Level of Assistance: 5: Stand by assistance Dynamic Sitting - Balance Activities: Lateral lean/weight shifting;Forward lean/weight shifting;Reaching for objects Sitting balance - Comments: able to don knee brace and shoes using AE and reaching outside BOS with supervision Static Standing Balance Static Standing - Balance Support: Bilateral upper extremity supported Static Standing - Level of Assistance: 2: Max assist Static Standing - Comment/# of Minutes: 1-2 min during LB dressing using platform walker Extremity/Trunk Assessment RUE Assessment RUE Assessment: Exceptions to [facility] Active Range of Motion (AROM) Comments: WFL for functional tasks with use of AE for compensation, but AROM shoulder FLX to ~125 degrees General Strength Comments: WFL for functional tasks LUE Assessment LUE Assessment: Exceptions to [facility] Active Range of Motion (AROM) Comments: Decreased AROM for shoulder FLX to 80-90 degrees General Strength Comments: WFL for functional tasks assessed  Care Tool Care Tool Self Care Eating   Eating Assist Level: Set up assist    Oral Care    Oral Care Assist Level: Set up assist     Bathing   Body parts bathed by patient: Right arm;Left arm;Chest;Abdomen;Right upper leg;Left upper leg;Face Body parts bathed by helper: Right lower leg;Left lower leg   Assist Level: Moderate Assistance - Patient 50 - 74%    Upper Body Dressing(including orthotics)   What is the patient wearing?: Pull over shirt   Assist Level: Set up assist    Lower Body Dressing (excluding footwear)   What is the patient wearing?: Pants Assist for lower body dressing: Maximal Assistance - Patient 25 - 49%    Putting on/Taking off footwear   What is the patient wearing?: Shoes Assist for footwear: Maximal Assistance - Patient 25 - 49%       Care Tool Toileting Toileting activity         Care Tool Bed Mobility Roll left and right activity        Sit to lying activity   Sit to lying assist level: Maximal Assistance - Patient 25 - 49%    Lying to sitting edge of bed activity   Lying to sitting edge of bed assist level: Maximal Assistance - Patient 25 - 49%     Care Tool Transfers Sit to stand transfer   Sit to stand assist level: 2 Helpers    Chair/bed transfer Chair/bed transfer activity did not occur: Safety/medical concerns       Toilet transfer Toilet transfer activity did not occur: Safety/medical concerns       Care Tool Cognition Expression of Ideas and Wants Expression of Ideas and Wants: Some difficulty - exhibits some difficulty with expressing needs and ideas (e.g, some words or finishing thoughts) or speech is not clear   Understanding Verbal and Non-Verbal Content Understanding Verbal and Non-Verbal Content: Usually understands - understands most conversations, but misses some part/intent of message. Requires cues at times to understand   Memory/Recall Ability *first 3 days only Memory/Recall Ability *first 3 days only: Current season;Location of own room;Staff names and faces;That he or she is in a hospital/hospital unit    Refer to Care Plan for [place] 1 OT Short Term Goal 1 (Week 1): Patient will complete LB dressing mod A  with AE PRN and LRAD. OT Short Term Goal 2 (Week 1): Patient will complete 1/3 toileting tasks with mod A and AE PRN. OT Short Term Goal 3 (Week 1): Patient will complete sit<>stand max A of one helper in preparation for functional transfers. OT Short Term Goal 4 (Week 1): Patient will complete UB bathing/dressing set up A using AE PRN.  Recommendations for other services: Therapeutic Recreation  Stress management and Outing/community reintegration   Skilled Therapeutic Intervention Session 1: Patient received supine in bed, agreeable to OT eval, with wife and son present, reporting no pain. Pt and family education on POC, role of OT, and rehab expectations; demo'd good understanding. Pt hyperverbose in session, but provided good account of PLOF and home set up. Pt expressed being fairly sedentary at home and good at problem solving for mobility and safety despite disabilities. Pt performed ADLs and mobility as noted below. VC's and handling required for bed mobility and sit<>stand x2 trials using platform RW for body mechanics and safe use of DME. Pt on 2L of O2 throughout session and demo'd good O2 saturation throughout seated activities EOB. Pt used own AE to complete LB dressing (threading shorts and L knee braces x2); total A to don R knee ace wrap. In stance using platform RW requiring +2 assistance to stand, pt completed mod A but required max A to maintain stance demoing anterior lean and difficulties with pulling hips forward. Pt required max-total A to pull pants up in stance and declined further practice; doffed supine total A. Pt requiring max A +2 sit>sup and scooting HOB. Pt remained supine in bed, all needs met, alarm active and call bell in reach.  Session 2: Pt received supine in bed finishing PT eval, family present at beginning of session, expressing fatigue. Pt declined further  mobility practice but agreeable to BUE exercises bed level. Pt engaged in grooming (oral hygiene) with set up A. Completed BUE exercises with orange level 2 theraband: 15x horizontal abduction, 10x forward punches, triceps pulls, and diagonal pulls. Pt demo'd increased fatigue and required intermittent rest breaks due to SOB. Education on energy conservation and breathing techniques with good carryover. Pt's O2 sat at 95% following activity. Pt requested to doff shirt and don gown; max A due to pt being unable to pull trunk away from back of bed when [facility] raised significantly despite using bed rails. Pt remained semifowlers in bed, alarm set, all needs met.  ADL ADL Equipment Provided:  (Pt owns and regularly uses reacher, long-handled sponges) Eating: Set up Where Assessed-Eating: Bed level Grooming: Setup Where Assessed-Grooming: Edge of bed Upper Body Bathing: Minimal assistance Where Assessed-Upper Body Bathing: Edge of bed Lower Body Bathing: Moderate assistance Where Assessed-Lower Body Bathing: Edge of bed Upper Body Dressing: Setup Where Assessed-Upper Body Dressing: Edge of bed Lower Body Dressing: Maximal assistance Where Assessed-Lower Body Dressing: Edge of bed;Other (Comment) (with platform walker in stance) Toileting: Not assessed (Pt using foley catheter at eval) Toilet Transfer: Not assessed Tub/Shower Transfer: Not assessed Social research officer, government: Not assessed ADL Comments: Pt demoing difficulties with sit<>stand using platform walker; transfers discontinued at eval due to BLE weakness and unsteadiness on feet. Pt does not want to take showers as he sponge bathes at home on toilet. Uses a lot of AE/problem solving at home to complete ADLs as independently as possible, but still receives help for pulling up pants in standing and donning footwear. Mobility  Bed Mobility Bed Mobility: Rolling Right;Rolling Left;Supine to Sit;Sit to Supine;Scooting  to Radar Base Right: Moderate  Assistance - Patient 50-74% Rolling Left: Moderate Assistance - Patient 50-74% Supine to Sit: 2 Helpers;Moderate Assistance - Patient 50-74% Sit to Supine: 2 Helpers;Total Assistance - Patient < 25% Scooting to HOB: 2 Helpers;Total Assistance - Patient < 25% Transfers Sit to Stand: 2 Helpers Stand to Sit: 2 Helpers   Discharge Criteria: Patient will be discharged from OT if patient refuses treatment 3 consecutive times without medical reason, if treatment goals not met, if there is a change in medical status, if patient makes no progress towards goals or if patient is discharged from hospital.  The above assessment, treatment plan, treatment alternatives and goals were discussed and mutually agreed upon: by patient and by family  Mellissa Kohut 03/03/2021, 3:52 PM

## 2021-03-04 DIAGNOSIS — R5381 Other malaise: Principal | ICD-10-CM

## 2021-03-04 DIAGNOSIS — I872 Venous insufficiency (chronic) (peripheral): Secondary | ICD-10-CM

## 2021-03-04 DIAGNOSIS — J441 Chronic obstructive pulmonary disease with (acute) exacerbation: Secondary | ICD-10-CM

## 2021-03-04 DIAGNOSIS — R3915 Urgency of urination: Secondary | ICD-10-CM

## 2021-03-04 MED ORDER — HYDROCERIN EX CREA
TOPICAL_CREAM | Freq: Two times a day (BID) | CUTANEOUS | Status: DC
  Administered 2021-03-16: 1 via TOPICAL
  Filled 2021-03-04 (×2): qty 113

## 2021-03-04 NOTE — Progress Notes (Signed)
PROGRESS NOTE   Subjective/Complaints:  Pt reports legs are always dry and cracked- has been using lotion- Cetaphil on them- no sig. Improvement- suggested eucerin or vaseline when goes home.   Catheter leaked last night- external catheter- sounds like a nurse last night removed external catheter-and pt has decided would rather use urinal at this time, not condom catheter.    ROS:  Pt denies SOB, abd pain, CP, N/V/C/D, and vision changes   Objective:   No results found. Recent Labs    03/03/21 1003  WBC 5.9  HGB 10.8*  HCT 34.6*  PLT 154   Recent Labs    03/03/21 1003  NA 139  K 4.2  CL 99  CO2 34*  GLUCOSE 91  BUN 21  CREATININE 1.34*  CALCIUM 9.1    Intake/Output Summary (Last 24 hours) at 03/04/2021 1602 Last data filed at 03/04/2021 0720 Gross per 24 hour  Intake 480 ml  Output 650 ml  Net -170 ml     Pressure Injury 03/02/21 Sacrum Mid Stage 2 -  Partial thickness loss of dermis presenting as a shallow open injury with a red, pink wound bed without slough. Small circular area in crack (Active)  03/02/21 1720  Location: Sacrum  Location Orientation: Mid  Staging: Stage 2 -  Partial thickness loss of dermis presenting as a shallow open injury with a red, pink wound bed without slough.  Wound Description (Comments): Small circular area in crack  Present on Admission: Yes    Physical Exam: Vital Signs Blood pressure (!) 144/91, pulse 60, temperature (!) 97.5 F (36.4 C), temperature source Oral, resp. rate 20, height 6\' 2"  (1.88 m), weight (!) 172.8 kg, SpO2 97 %.     General: awake, alert, appropriate, laying in bed- BMI 49, verbose; NAD HENT: conjugate gaze; oropharynx moist; wearing O2 by [place]- 3L CV: borderline bradycardia, but regular rate overall;  no JVD Pulmonary: decreased at bases- coarse, but no W/R/R- wearing 3L by [place] (didn't have O2 at home during day).  GI: soft, NT, ND,  (+)BS Psychiatric: appropriate; but verbose, flat Neurological: alert Ext: Severe edema bilaterally with stasis changes Psych: pleasant, normal affect Skin: intact but very badly cracking from knees to toes- like too dry ground Genitourinary: Has a towel placed on groin if was wet- was not.  Neurological:    Mental Status: He is alert. 5/5 strenght in upper extremities, 3/5 in LE, 2/5 LE DF    Assessment/Plan: 1. Functional deficits which require 3+ hours per day of interdisciplinary therapy in a comprehensive inpatient rehab setting. Physiatrist is providing close team supervision and 24 hour management of active medical problems listed below. Physiatrist and rehab team continue to assess barriers to discharge/monitor patient progress toward functional and medical goals  Care Tool:  Bathing    Body parts bathed by patient: Right arm, Left arm, Chest, Abdomen, Right upper leg, Left upper leg, Face   Body parts bathed by helper: Right lower leg, Left lower leg     Bathing assist Assist Level: Moderate Assistance - Patient 50 - 74%     Upper Body Dressing/Undressing Upper body dressing Upper body dressing/undressing activity did not occur (  including orthotics): N/A What is the patient wearing?: Hospital gown only    Upper body assist Assist Level: Set up assist    Lower Body Dressing/Undressing Lower body dressing    Lower body dressing activity did not occur: N/A What is the patient wearing?: Incontinence brief     Lower body assist Assist for lower body dressing: 2 Helpers     Toileting Toileting Toileting Activity did not occur (Clothing management and hygiene only): N/A (no void or bm)  Toileting assist Assist for toileting: Total Assistance - Patient < 25%     Transfers Chair/bed transfer  Transfers assist  Chair/bed transfer activity did not occur: Safety/medical concerns  Chair/bed transfer assist level: 2 Helpers      Locomotion Ambulation   Ambulation assist   Ambulation activity did not occur: Safety/medical concerns          Walk 10 feet activity   Assist  Walk 10 feet activity did not occur: Safety/medical concerns        Walk 50 feet activity   Assist Walk 50 feet with 2 turns activity did not occur: Safety/medical concerns         Walk 150 feet activity   Assist Walk 150 feet activity did not occur: Safety/medical concerns         Walk 10 feet on uneven surface  activity   Assist Walk 10 feet on uneven surfaces activity did not occur: Safety/medical concerns         Wheelchair     Assist Will patient use wheelchair at discharge?: Yes Type of Wheelchair: Power Wheelchair activity did not occur: Safety/medical concerns         Wheelchair 50 feet with 2 turns activity    Assist    Wheelchair 50 feet with 2 turns activity did not occur: Safety/medical concerns       Wheelchair 150 feet activity     Assist  Wheelchair 150 feet activity did not occur: Safety/medical concerns       Blood pressure (!) 144/91, pulse 60, temperature (!) 97.5 F (36.4 C), temperature source Oral, resp. rate 20, height 6\' 2"  (1.88 m), weight (!) 172.8 kg, SpO2 97 %.  Medical Problem List and Plan: 1.  Cardiopulmonary debility             -patient may shower             -ELOS/Goals: 2-3 weeks minA   -con't CIR- PT and OT 2.  Antithrombotics: -DVT/anticoagulation:  Eliquis bid             -antiplatelet therapy: N/A 3. Pain Management: Continue Tylenol prn  4. Mood: LCSW to follow for evaluation and support.              -antipsychotic agents: N/A 5. Neuropsych: This patient is capable of making decisions on his own behalf. 6. Skin/Wound Care: Routine pressure relief measures.  7. Fluids/Electrolytes/Nutrition: Strict I/O. Check CMET in am. 8. CAD/Chronic diastolic CHF: Monitor for signs of overload and check weight daily. --1500 cc FR/day. 9. COPD:  Continue Breo and Incruse daily. 6/15- O2 SATS 97%- will try to wean O2- don't want O2 sats higher than 93-94% due to COPD.  10. OHS/OSA: On BIPAP with 2 Liters bled in PTA.   11. Acute on chronic renal failure:? He denies any CKD --SCr trending back to new baseline around 1.3-1.4?              --Monitor for signs of overaload. Off  lasix.  6/15- Cr 1.34- stable -con't regimen and monitoring 12. Stasis dermatitis: Continue local care   6/15- will add Eucerin BID and monitor- would suggest vaseline for home care to wife.  13. Intermittent Diarrhea: Monitor for now.  14. Urinary urgency, chronic: this is very distressing to patient, so please attend to him as soon as possible. Would benefit from briefs and q1H timed voiding while awake.     6/15- will d/c external catheter at night- the glue is distressing and doesn't want it anymore- will con't timed toileting.     LOS: 2 days A FACE TO FACE EVALUATION WAS PERFORMED  Zinia Innocent 03/04/2021, 4:02 PM

## 2021-03-04 NOTE — Progress Notes (Signed)
Physical Therapy Session Note  Patient Details  Name: [name] MRN: [id number] Date of Birth: 22-Jul-1941  Today's Date: 03/04/2021 PT Individual Time: 0830-0930 PT Individual Time Calculation (min): 60 min   Short Term Goals: Week 1:  PT Short Term Goal 1 (Week 1): Patient to perform supine to sit with mod A of 1. PT Short Term Goal 2 (Week 1): Patient to perform sit to stand from higher surface with mod A of 2 for safety x 3 reps PT Short Term Goal 3 (Week 1): Patient to perform stand step to shuttle chair with PFRW and +2 A PT Short Term Goal 4 (Week 1): Patient to perform sit to supine with mod A of 2  Skilled Therapeutic Interventions/Progress Updates:    Patient in supine and reports had difficulty overnight due to oxygen not hooked to his bi-pap and oxygen sats dropping.  Noted pt on continuous oxygen saturation monitor and now on 4L O2.  Patient in supine to perform ankle pumps, heel slides AAROM.  Noted Shuttle chair in the room and initiated setting up for potential transfers.  Moved bed over to allow for room on L side of bed as pt exits bed at home from L side.    Patient supine to sit with +2 mod A for lifting trunk and increased time to scoot hips.  Patient S for sitting balance and shoes donned with total A in sitting.  Performed sit to stand to bilat PFRW with +2 A for safety, but pt needing only about 25% help overall.  Standing with increased time for balance and lateral weight shift with R knee blocked before able to step with L toward chair.  Able to step with +2 A for safety to shuttle chair.  Noted SpO2 down to mid 80's but quickly back to 92% on 4L O2 after transfer.    Seated in chair to perform incentive spirometer x 10 up to 1100 ml, LAQ w/ 5 sec hold, ant/post rocking with arms crossed over chest for core strength and rows with orange t-band single UE R then L x 15.  Patient left seated in chair with wife and close friend in the room.  Set up cups from home for  potential self assist to urinate and OT informed pt may need to practice with urinal.  RN in the room during session as well to inform pt on plan to change rooms so he can be in room with sky lift if needed for nursing assist with transfer.   Therapy Documentation Precautions:  Precautions Precautions: Fall, Other (comment) Precaution Comments: Watch SpO2, foley catheter Required Braces or Orthoses: Other Brace Other Brace: Has bilateral knee braces/compression wrap (for stability); left knee hinged brace then places elastic sleeve over brace; rt knee prefers ace wrap tightly for support Restrictions Weight Bearing Restrictions: No  Pain: Pain Assessment Pain Scale: 0-10 Pain Score: 8  Pain Type: Other (Comment) (Pt. states he feel it is gout thats causing the pain) Pain Location: Wrist Pain Orientation: Left Pain Descriptors / Indicators: Burning;Aching     Therapy/Group: Individual Therapy  Reginia Naas Magda Kiel, PT 03/04/2021, 8:23 AM

## 2021-03-04 NOTE — Progress Notes (Signed)
Occupational Therapy Session Note  Patient Details  Name: [name] MRN: [id number] Date of Birth: 10/04/1940  Today's Date: 03/04/2021 OT Individual Time: 2376-2831 and 5176-1607 OT Individual Time Calculation (min): 60 min and 73 min   Short Term Goals: Week 1:  OT Short Term Goal 1 (Week 1): Patient will complete LB dressing mod A with AE PRN and LRAD. OT Short Term Goal 2 (Week 1): Patient will complete 1/3 toileting tasks with mod A and AE PRN. OT Short Term Goal 3 (Week 1): Patient will complete sit<>stand max A of one helper in preparation for functional transfers. OT Short Term Goal 4 (Week 1): Patient will complete UB bathing/dressing set up A using AE PRN.  Skilled Therapeutic Interventions/Progress Updates:    Session 1: Pt received seated in bariatric recliner following PT session, fatigued from stand step transfer from bed, family present, and agreeable to OT session. Pt on 4L O2 throughout session. Pt education and discussion of toileting options while seated in bariatric recliner to simulate home environment (pt uses large cups that he has with him instead of urinal). Pt attempted urination but unable to void. Completed oral hygiene set up A. Threaded legs through pants with set up A using own reacher. +2 assistance for sit<>stand from increased height using platform RW to complete LB dressing and pull pants over hips. Trial of placement of cups for urination following LB dressing to ensure he could complete without changing positions; pt demo'd good understanding of necessary instructions for caregivers. Pt engaged in Alexander using level 2 orange theraband in preparation for increased independence in functional mobility and BADLs. Pt completed shoulder rows, horizontal abduction, shoulder FLX, triceps pulls 15x each with vc's for breathing techniques; O2 sats remained stable throughout exercises. Pt remained seated in bari recliner, all needs met, family present.  Session  2: Pt received seated in bariatric recliner, agreeable to OT, reporting slight discomfort in lower back improved with stretching and changing positions. Pt placed on 4L transportable O2 and wheeled in chair to gym. Pt engaged in therapeutic activities targeting core, BUE, and BLEs to improve tolerance to OOB, general conditioning, and to increase independence in BADLs and functional mobility. Pt completed dowel rod volleyball with 2lb dowel rod 35x; modified russian twists with 5lb weight 10x, and shoulder flx activities with 3lb hand weights 10x bilaterally. Pt engaged in dynamic reaching task sitting with back unsupported while reaching outside BOS at close spvsn level. Pt's O2 sats decreased slightly during reaching tasks, rest break provided and vc's for breathing techniques (see vitals below). Pt taken to new hospital room and completed sit<>stand using bilateral platform RW with +2 assistance following several trials. Pt completed stand step transfer chair>bed with platform RW requiring +2/3 assistance. Pt demonstrated difficulties stepping with BLEs, especially LLE, requiring manual facilitation to move LLE and to support RLE at knee and hip during L step. Halfway through transfer became fatigued, requesting to sit, requiring to complete at stand pivot level. Pt returned to supine with increased time and +2/3 assistance; required [place] position briefly to scoot to [facility] with +2 using bed rail. Pt requesting bed pan for bowel movement; placed on bed pan with +2 A and with urinal; informed nursing and left with call bell in reach.  Therapy Documentation Precautions:  Precautions Precautions: Fall, Other (comment) Precaution Comments: Watch SpO2, foley catheter Required Braces or Orthoses: Other Brace Other Brace: Has bilateral knee braces/compression wrap (for stability); left knee hinged brace then places elastic sleeve  over brace; rt knee prefers ace wrap tightly for  support Restrictions Weight Bearing Restrictions: No Vital Signs: Pt vitals (HR and O2 sats) remaining stable in seated and standing activities occasionally with O2 sats dropping to mid 80's. Improved following cues for breathing techniques. Pain: Pain Assessment Pain Scale: 0-10 Pain Score: 0-No pain   Therapy/Group: Individual Therapy  Mellissa Kohut 03/04/2021, 4:30 PM

## 2021-03-05 DIAGNOSIS — G7281 Critical illness myopathy: Secondary | ICD-10-CM | POA: Diagnosis present

## 2021-03-05 LAB — VITAMIN E
Vitamin E (Alpha Tocopherol): 9 mg/L (ref 9.0–29.0)
Vitamin E(Gamma Tocopherol): 0.9 mg/L (ref 0.5–4.9)

## 2021-03-05 MED ORDER — POLYETHYLENE GLYCOL 3350 17 G PO PACK: 17.0000 g | PACK | ORAL | Status: DC

## 2021-03-05 NOTE — IPOC Note (Signed)
Overall Plan of Care [facility]) Patient Details Name: [name] MRN: [id number] DOB: 05-10-1941  Admitting Diagnosis: Intensive care (ICU) myopathy  Hospital Problems: Principal Problem:   Intensive care (ICU) myopathy Active Problems:   Pressure injury of skin   Debility     Functional Problem List: Nursing Bladder, Bowel, Pain, Endurance, Safety, Medication Management  PT Balance, Endurance, Motor, Safety, Sensory  OT Balance, Safety, Cognition, Sensory, Skin Integrity, Edema, Endurance, Nutrition  SLP    TR         Basic ADL's: OT Grooming, Bathing, Dressing, Toileting     Advanced  ADL's: OT Simple Meal Preparation     Transfers: PT Bed to Chair, Car, Furniture, [facility] Mobility, Floor  OT Toilet, Tub/Shower     Locomotion: PT Ambulation, Wheelchair Mobility, Other (comment)     Additional Impairments: OT None  SLP        TR      Anticipated Outcomes Item Anticipated Outcome  Self Feeding set up A  Swallowing      Basic self-care  min A using AE PRN  Toileting  mod A using AE PRN   Bathroom Transfers min A  Bowel/Bladder  manage bowel and bladder with mod I assist  Transfers  S fom higher sufrace  Locomotion  Ambulating short household distance with S w/ B PFRW  Communication     Cognition     Pain  pain at or below level 4  Safety/Judgment  maintain safety with cues/reminders   Therapy Plan: PT Intensity: Minimum of 1-2 x/day ,45 to 90 minutes PT Frequency: 5 out of 7 days PT Duration Estimated Length of Stay: 3-4 weeks OT Intensity: Minimum of 1-2 x/day, 45 to 90 minutes OT Frequency: 5 out of 7 days OT Duration/Estimated Length of Stay: 3-4 weeks depending on progression towards goals     Due to the current state of emergency, patients may not be receiving their 3-hours of Medicare-mandated therapy.   Team Interventions: Nursing Interventions Bladder Management, Disease Management/Prevention, Medication Management, Discharge  Planning, Skin Care/Wound Management, Pain Management, Bowel Management, Patient/Family Education  PT interventions Ambulation/gait training, Training and development officer, Discharge planning, DME/adaptive equipment instruction, Functional mobility training, Splinting/orthotics, Therapeutic Activities, UE/LE Strength taining/ROM, Wheelchair propulsion/positioning, UE/LE Coordination activities, Therapeutic Exercise, Patient/family education, Neuromuscular re-education, Disease management/prevention, Community reintegration  OT Interventions Training and development officer, Discharge planning, Self Care/advanced ADL retraining, Therapeutic Activities, UE/LE Coordination activities, Disease mangement/prevention, Functional mobility training, Patient/family education, Skin care/wound managment, Therapeutic Exercise, Community reintegration, Engineer, drilling, Psychosocial support, UE/LE Strength taining/ROM  SLP Interventions    TR Interventions    SW/CM Interventions Discharge Planning, Psychosocial Support, Patient/Family Education   Barriers to Discharge MD  Medical stability, Home enviroment access/loayout, Incontinence, Wound care, Lack of/limited family support, Weight, New oxygen, and newly dx'd  ICU myopathy  Nursing Decreased caregiver support, Wound Care, Home environment access/layout, Weight, Insurance for SNF coverage, Incontinence 2 level ramped entry wtih wife (dementia) and son works from home  PT Decreased caregiver support, Lack of/limited family support, Weight, New oxygen wife recently fell and now using rollator, son works 10-7 4 days a week  OT Tree surgeon, Weight, New oxygen While pt's home is set up well for using necessary AD, may have more challenging time using platform walker due to endurance deficits in tight spaces at home at time of eval  SLP      SW Weight     Team Discharge Planning: Destination: PT-Home ,OT- Home , SLP-  Projected  Follow-up: PT-Home health PT, 24 hour supervision/assistance, OT-  Home health OT, SLP-  Projected Equipment Needs: PT-None recommended by PT, OT- 3 in 1 bedside comode, To be determined, SLP-  Equipment Details: PT- , OT-Bariatric 3:1; likely no shower equipment needed due to pt sponge baths Patient/family involved in discharge planning: PT- Patient, Family Midwife,  OT-Patient, Family member/caregiver, SLP-   MD ELOS: 3-4 weeks minimum Medical Rehab Prognosis:  Fair Assessment: Pt is a 80 yr old male with newly dx'd ICU myopathy (base don strength exam- DF is 2/5 and LE's overall 3-/5 at most). Also has complicated medical hx with new need for O2- BMI of 49 and dCHF on fluid restriction-  Goals - min A and occ set up   See Team Conference Notes for weekly updates to the plan of care

## 2021-03-05 NOTE — Progress Notes (Signed)
Occupational Therapy Session Note  Patient Details  Name: [name] MRN: [id number] Date of Birth: [birth year]  Today's Date: 03/05/2021 OT Individual Time: 6226-3335 OT Individual Time Calculation (min): 60 min    Short Term Goals: Week 1:  OT Short Term Goal 1 (Week 1): Patient will complete LB dressing mod A with AE PRN and LRAD. OT Short Term Goal 2 (Week 1): Patient will complete 1/3 toileting tasks with mod A and AE PRN. OT Short Term Goal 3 (Week 1): Patient will complete sit<>stand max A of one helper in preparation for functional transfers. OT Short Term Goal 4 (Week 1): Patient will complete UB bathing/dressing set up A using AE PRN.  Skilled Therapeutic Interventions/Progress Updates:    Pt received supine in bed, agreeable to OT, reporting no pain. Session focus on ADL morning routine. Pt reported no need for toileting but agreed to don pants/shoes. Improved BLE scooting to EOB in supine. Sup>sit +2 at trunk and BLEs with increased time. Pt tolerating static sitting balance with close spvsn when bed elevated and feet positioned. Pt donned L knee braces x2 using AE and close spvsn; dependent to don R knee ace wrap. Threaded legs through pants using AE at set up A level. Donned shoes with max A to push heels into shoes. Pt attempted sit<>stand x3 with +2 A using platform RW but unable to maintain stance for more than a few seconds or fully extended bilateral knees/hips to balance. Pt O2 sat dropping to mid 80% during standing trials but improving upon vc's for breathing techniques. Pt motivated to stand and complete stand pivot transfer to sit up in bariatric recliner and became disheartened that he was unable to do so this session. Provided emotional support and therapeutic listening. Pt close to EOB and unable to scoot posteriorly with +2 due to position. Sit>sup +2 A and vc's for body mechanics. Dependent to roll L and R to place chuck pad with +2 and NT assistance. Scooting to  [facility] dependent. Pt discussed confusion of whether or not he was on fluid restrictions with RN. Pt remained supine in bed, all needs met, RN present, and call bell in reach.  Therapy Documentation Precautions:  Precautions Precautions: Fall, Other (comment) Precaution Comments: Watch SpO2, foley catheter Required Braces or Orthoses: Other Brace Other Brace: Has bilateral knee braces/compression wrap (for stability); left knee hinged brace then places elastic sleeve over brace; rt knee prefers ace wrap tightly for support Restrictions Weight Bearing Restrictions: No  Vital Signs: Oxygen Therapy SpO2: 93 % at rest; 84% during sit<>stand trial O2 Device: Nasal Cannula O2 Flow Rate (L/min): 3 L/min Pain: Pain Assessment Pain Scale: 0-10 Pain Score: 0-No pain   Therapy/Group: Individual Therapy  Mellissa Kohut 03/05/2021, 12:06 PM

## 2021-03-05 NOTE — Progress Notes (Signed)
Physical Therapy Session Note  Patient Details  Name: [name] MRN: [id number] Date of Birth: [birth year]  Today's Date: 03/05/2021 PT Individual Time: Session1: 1100-1210; Session2: 8546-2703 PT Individual Time Calculation (min): 70 min & 75 min   Short Term Goals: Week 1:  PT Short Term Goal 1 (Week 1): Patient to perform supine to sit with mod A of 1. PT Short Term Goal 2 (Week 1): Patient to perform sit to stand from higher surface with mod A of 2 for safety x 3 reps PT Short Term Goal 3 (Week 1): Patient to perform stand step to shuttle chair with PFRW and +2 A PT Short Term Goal 4 (Week 1): Patient to perform sit to supine with mod A of 2  Skilled Therapeutic Interventions/Progress Updates:    Session1:  Patient in supine and reports difficulty trying to stand with OT this morning due to bed not high enough.  Also reports his O2 sats dropped overnight.  Patient supine to sit with mod A.  Seated EOB for therex consisting of scapular squeezes with trunk extension and 10 sec hold x 5, LAQ x 10 w/ 3 sec hold, trunk flex/ext with arms crossed over chest x 10.  Sit to squat pulling up on handles on shuttle chair x 5 reps with slight lift off.  Patient attempted sit to stand x 3 to B PFRW and unable to get up despite pushing on R knee to extend and lock knee.  Sit to supine with +2 A for LE's and sliding up with several attempts in trendelenberg.  Placed lift pad under pt pulling up on rails with HOB up and then laying supine to roll and place leg loops.  Patient lifted using maxisky to bet to shuttle chair.  Difficulty scooting back in chair and attempted lateral leans to scoot with assist. Still unable to get back in chair so placed chair in stretcher position and +3 assist to scoot up.  Patient left seated in chair with call bell and needs in reach and family arriving in the room.   Session2: Patient reclined in [place] chair.  Reports some discomfort from sitting on sling straps.   Patient assisted in chair to therapy gym.  Performed seated core strength activities including moving weighted ball over table, pushing table in/out, moving weighted ball hip to hip and in & out.  Patient cycled on UBE forward 2.5 min and back 2.5 min.  Patient assisted in chair back to room.  Attempted sit to stand to [place] with +2 A and elevated height on chair.  Patient unable and not wanting to attempt again.  Assisted to bed via maxi sky lift with +2 A.  Patient able to lift trunk with bed rails for sling removal.  Requesting bedpan.  NT in room and patient rolled with max A and placed bedpan.  Assisted for pan removal and hygiene with total A.  Patient scooted to [facility] with +2 A and bed in trendelenberg.  Left with call bell in reach, bed alarm active and family in the room.   Therapy Documentation Precautions:  Precautions Precautions: Fall, Other (comment) Precaution Comments: Watch SpO2, foley catheter Required Braces or Orthoses: Other Brace Other Brace: Has bilateral knee braces/compression wrap (for stability); left knee hinged brace then places elastic sleeve over brace; rt knee prefers ace wrap tightly for support Restrictions Weight Bearing Restrictions: No  Vital Signs: Oxygen Therapy SpO2: 93 % O2 Device: Nasal Cannula O2 Flow Rate (L/min): 3 L/min Pain: Pain  Assessment Pain Scale: Faces Pain Score: 0-No pain Faces Pain Scale: Hurts little more Pain Type: Acute pain Pain Location: Wrist Pain Orientation: Left Pain Descriptors / Indicators: Sore Pain Intervention(s): Repositioned   Therapy/Group: Individual Therapy  Reginia Naas [place], PT 03/05/2021, 8:30 AM

## 2021-03-05 NOTE — Progress Notes (Signed)
Patient is tolerating use of urinal with assistance of 1 at this time. Patient is continent enough to use same. External catheter was removed by day staff on 6/14 due to skin irritation from the adhesive. Condom cath or Primo not effective either for patient (leaking).

## 2021-03-05 NOTE — Progress Notes (Signed)
PROGRESS NOTE   Subjective/Complaints:  Pt reports no constipation, but he passes gas a lot and thinks it's stool- so getting on bedpan frequently with no results- LBM was yesterday.  Took miralax daily at home as well.   Was not on O2 during the day at home.  ROS:  Pt denies SOB, abd pain, CP, N/V/C/D, and vision changes   Objective:   No results found. Recent Labs    03/03/21 1003  WBC 5.9  HGB 10.8*  HCT 34.6*  PLT 154   Recent Labs    03/03/21 1003  NA 139  K 4.2  CL 99  CO2 34*  GLUCOSE 91  BUN 21  CREATININE 1.34*  CALCIUM 9.1    Intake/Output Summary (Last 24 hours) at 03/05/2021 1756 Last data filed at 03/05/2021 1245 Gross per 24 hour  Intake 360 ml  Output 200 ml  Net 160 ml     Pressure Injury 03/02/21 Sacrum Mid Stage 2 -  Partial thickness loss of dermis presenting as a shallow open injury with a red, pink wound bed without slough. Small circular area in crack (Active)  03/02/21 1720  Location: Sacrum  Location Orientation: Mid  Staging: Stage 2 -  Partial thickness loss of dermis presenting as a shallow open injury with a red, pink wound bed without slough.  Wound Description (Comments): Small circular area in crack  Present on Admission: Yes    Physical Exam: Vital Signs Blood pressure 135/80, pulse 62, temperature 97.9 F (36.6 C), temperature source Oral, resp. rate 20, height 6\' 2"  (1.88 m), weight (!) 173.1 kg, SpO2 98 %.      General: awake, alert, appropriate, laying supine in bed; NAD HENT: conjugate gaze; oropharynx moist- O2 3L by [place] in place CV: regular rate; no JVD Pulmonary: coarse, decreased at bases- hypoventilation syndrome sounds noted; no W/R/R GI: soft, NT, ND, (+)BS; very protuberant- BMI 49 Psychiatric: verbose; flat Neurological: alert Ext: Severe edema bilaterally with stasis changes- 3-4+ pitting edema B/L  Skin: intact but very badly cracking from  knees to toes- like too dry ground Neurological:    Mental Status: He is alert. 5/5 strenght in upper extremities, 3/5 in LE, 2/5 LE DF    Assessment/Plan: 1. Functional deficits which require 3+ hours per day of interdisciplinary therapy in a comprehensive inpatient rehab setting. Physiatrist is providing close team supervision and 24 hour management of active medical problems listed below. Physiatrist and rehab team continue to assess barriers to discharge/monitor patient progress toward functional and medical goals  Care Tool:  Bathing    Body parts bathed by patient: Right arm, Left arm, Chest, Abdomen, Right upper leg, Left upper leg, Face   Body parts bathed by helper: Right lower leg, Left lower leg     Bathing assist Assist Level: Moderate Assistance - Patient 50 - 74%     Upper Body Dressing/Undressing Upper body dressing Upper body dressing/undressing activity did not occur (including orthotics): N/A What is the patient wearing?: Hospital gown only    Upper body assist Assist Level: Set up assist    Lower Body Dressing/Undressing Lower body dressing    Lower body dressing activity did  not occur: N/A What is the patient wearing?: Pants     Lower body assist Assist for lower body dressing: 2 Helpers     Toileting Toileting Toileting Activity did not occur (Clothing management and hygiene only): N/A (no void or bm)  Toileting assist Assist for toileting: Total Assistance - Patient < 25%     Transfers Chair/bed transfer  Transfers assist  Chair/bed transfer activity did not occur: Safety/medical concerns  Chair/bed transfer assist level: Dependent - mechanical lift     Locomotion Ambulation   Ambulation assist   Ambulation activity did not occur: Safety/medical concerns          Walk 10 feet activity   Assist  Walk 10 feet activity did not occur: Safety/medical concerns        Walk 50 feet activity   Assist Walk 50 feet with 2 turns  activity did not occur: Safety/medical concerns         Walk 150 feet activity   Assist Walk 150 feet activity did not occur: Safety/medical concerns         Walk 10 feet on uneven surface  activity   Assist Walk 10 feet on uneven surfaces activity did not occur: Safety/medical concerns         Wheelchair     Assist Will patient use wheelchair at discharge?: Yes Type of Wheelchair: Power Wheelchair activity did not occur: Safety/medical concerns         Wheelchair 50 feet with 2 turns activity    Assist    Wheelchair 50 feet with 2 turns activity did not occur: Safety/medical concerns       Wheelchair 150 feet activity     Assist  Wheelchair 150 feet activity did not occur: Safety/medical concerns       Blood pressure 135/80, pulse 62, temperature 97.9 F (36.6 C), temperature source Oral, resp. rate 20, height 6\' 2"  (1.88 m), weight (!) 173.1 kg, SpO2 98 %.  Medical Problem List and Plan: 1.  Cardiopulmonary debility with ICU myopathy- his strength is MUCH weaker than expected for just debility- His DF's are 2/5 at most- cannot put PRAFOs on due to poor condition of skin             -patient may shower             -ELOS/Goals: 2-3 weeks minA   Continue CIR- PT, OT and SLP = 2.  Antithrombotics: -DVT/anticoagulation:  Eliquis bid             -antiplatelet therapy: N/A 3. Pain Management: Continue Tylenol prn  4. Mood: LCSW to follow for evaluation and support.              -antipsychotic agents: N/A 5. Neuropsych: This patient is capable of making decisions on his own behalf. 6. Skin/Wound Care: Routine pressure relief measures.  7. Fluids/Electrolytes/Nutrition: Strict I/O. Check CMET in am. 8. CAD/Chronic diastolic CHF: Monitor for signs of overload and check weight daily. --1500 cc FR/day. 6/16- Weight up 1.5 kg- if still increasing tomorrow, will give a dose of Lasix.  9. COPD: Continue Breo and Incruse daily. 6/15- O2 SATS 97%-  will try to wean O2- don't want O2 sats higher than 93-94% due to COPD.   6/16- Sats 88 to 94% when I was in room today on 3L- appeared comfortable- denied SOB- con't regimen 10. OHS/OSA: On BIPAP with 2 Liters bled in PTA.   11. Acute on chronic renal failure:? He denies any CKD --  SCr trending back to new baseline around 1.3-1.4?             --Monitor for signs of overaload. Off lasix.  6/15- Cr 1.34- stable -con't regimen and monitoring 12. Stasis dermatitis: Continue local care   6/15- will add Eucerin BID and monitor- would suggest vaseline for home care to wife.  13. Intermittent Diarrhea: Monitor for now.   6/16- took miralax daily at home- doing OK right now, but has some gas- con't regimen 14. Urinary urgency, chronic: this is very distressing to patient, so please attend to him as soon as possible. Would benefit from briefs and q1H timed voiding while awake.     6/15- will d/c external catheter at night- the glue is distressing and doesn't want it anymore- will con't timed toileting.     LOS: 3 days A FACE TO FACE EVALUATION WAS PERFORMED  Carie Kapuscinski 03/05/2021, 5:56 PM

## 2021-03-06 ENCOUNTER — Inpatient Hospital Stay (HOSPITAL_COMMUNITY): Payer: Medicare HMO

## 2021-03-06 MED ORDER — AMMONIUM LACTATE 12 % EX LOTN
TOPICAL_LOTION | Freq: Two times a day (BID) | CUTANEOUS | Status: DC
  Administered 2021-03-16: 1 via TOPICAL
  Filled 2021-03-06: qty 225
  Filled 2021-03-06 (×2): qty 222

## 2021-03-06 MED ORDER — COLCHICINE 0.6 MG PO TABS
0.6000 mg | ORAL_TABLET | Freq: Every day | ORAL | Status: AC
  Administered 2021-03-06 – 2021-03-10 (×5): 0.6 mg via ORAL
  Filled 2021-03-06 (×5): qty 1

## 2021-03-06 MED ORDER — POLYETHYLENE GLYCOL 3350 17 G PO PACK
17.0000 g | PACK | Freq: Every day | ORAL | Status: DC
  Administered 2021-03-06: 17 g via ORAL
  Filled 2021-03-06 (×2): qty 1

## 2021-03-06 NOTE — Progress Notes (Signed)
Physical Therapy Session Note  Patient Details  Name: [name] MRN: [id number] Date of Birth: [birth year]  Today's Date: 03/06/2021 PT Individual Time: 1400-1500 PT Individual Time Calculation (min): 60 min   Short Term Goals: Week 1:  PT Short Term Goal 1 (Week 1): Patient to perform supine to sit with mod A of 1. PT Short Term Goal 2 (Week 1): Patient to perform sit to stand from higher surface with mod A of 2 for safety x 3 reps PT Short Term Goal 3 (Week 1): Patient to perform stand step to shuttle chair with PFRW and +2 A PT Short Term Goal 4 (Week 1): Patient to perform sit to supine with mod A of 2  Skilled Therapeutic Interventions/Progress Updates:    Patient in supine and reports was able to stand this morning.  Reports was down for chest x-ray earlier this pm.  Supine to sit with mod A pt able to initiate with moving legs towards EOB and assist for trunk and scooting R hip forward.  Patient on 1-2L O2 throughout session (reports nursing trying to wean, but noted SpO2 87% so increased to 2L for mobility with SpO2 92%).  Patient sit<>stand from EOB at increased height to [place] with +2 A (one to block R knee and other for safety and support lifting from hip.)  Stood and attempt to step L foot forward while PT blocking R knee, but difficulty due to toes caught on floor.  Tech assisted L foot forward and pt pushed walker forward and able to take R step, then one more with each, then stepped backwards to bed with guarding assist for R knee and one episode of buckling noted.  Patient resting on EOB several minutes, then performed 2 more times each time able to take 2 steps with each foot forward and no further episodes of R knee buckling noted.  Patient sit to supine with +2 A for both legs and positioning trunk.  Scooted up in bed with +2 A and bed in trendelenberg.  Rolled to R with +2 A for placement of bed pan.  Patient positioned with HOB up and call bell in reach.  NT aware pt on bed  pan.   Therapy Documentation Precautions:  Precautions Precautions: Fall, Other (comment) Precaution Comments: Watch SpO2, foley catheter Required Braces or Orthoses: Other Brace Other Brace: Has bilateral knee braces/compression wrap (for stability); left knee hinged brace then places elastic sleeve over brace; rt knee prefers ace wrap tightly for support Restrictions Weight Bearing Restrictions: No  Pain: Pain Assessment Pain Score: 0-No pain    Therapy/Group: Individual Therapy  Reginia Naas [place], PT 03/06/2021, 5:25 PM

## 2021-03-06 NOTE — Progress Notes (Signed)
Occupational Therapy Session Note  Patient Details  Name: [name] MRN: [id number] Date of Birth: Mar 14, 1941  Today's Date: 03/06/2021 OT Individual Time: 1000-1100 OT Individual Time Calculation (min): 60 min    Short Term Goals: Week 1:  OT Short Term Goal 1 (Week 1): Patient will complete LB dressing mod A with AE PRN and LRAD. OT Short Term Goal 2 (Week 1): Patient will complete 1/3 toileting tasks with mod A and AE PRN. OT Short Term Goal 3 (Week 1): Patient will complete sit<>stand max A of one helper in preparation for functional transfers. OT Short Term Goal 4 (Week 1): Patient will complete UB bathing/dressing set up A using AE PRN.  Skilled Therapeutic Interventions/Progress Updates:    Pt received in room in [place] chair with family in room and consented to OT tx. Pt on 3L O2 via [place] with all VSS. Pt in good spirits this session and says he was able to stand during previous session. OT session focused on upper body conditioning and strengthening to increase strength and activity tolerance for ADLs and in prep for functional transfers. Weaned pt down to 2L O2 during session with vitals remaining stable. Pt instructed in 1#db unilateral exercises including shoulder flexion, shoulder abduction, chest press, elbow flexion for 3x15. Pt then instructed in 3#db exercise holding db in both hands and fwd trunk flexion for increased core strength in prep for EOB ADLs for 3x15. Pt required frequent rest breaks due to fatigue, weaned down to 1L O2 with VSS. Pt would drop into 80's during exercises but would recover immediatly to >92% at rest with one deep breath, question sensor accuracy. After tx, pt taken back to room, left on 1L O2 via [place] sitting up in shuttle chair with all needs met and VSS. Family and nurse at bedside.  Therapy Documentation Precautions:  Precautions Precautions: Fall, Other (comment) Precaution Comments: Watch SpO2, foley catheter Required Braces or Orthoses:  Other Brace Other Brace: Has bilateral knee braces/compression wrap (for stability); left knee hinged brace then places elastic sleeve over brace; rt knee prefers ace wrap tightly for support Restrictions Weight Bearing Restrictions: No   Vital Signs: Oxygen Therapy SpO2: 94 % O2 Device: Nasal Cannula O2 Flow Rate (L/min): 3 L/min Pain: none     Therapy/Group: Individual Therapy  Nabil Bubolz 03/06/2021, 10:20 AM

## 2021-03-06 NOTE — Progress Notes (Signed)
Occupational Therapy Session Note  Patient Details  Name: [name] MRN: [id number] Date of Birth: 10-Jun-1941  Today's Date: 03/06/2021 OT Individual Time: 8675-4492 OT Individual Time Calculation (min): 72 min    Short Term Goals: Week 1:  OT Short Term Goal 1 (Week 1): Patient will complete LB dressing mod A with AE PRN and LRAD. OT Short Term Goal 2 (Week 1): Patient will complete 1/3 toileting tasks with mod A and AE PRN. OT Short Term Goal 3 (Week 1): Patient will complete sit<>stand max A of one helper in preparation for functional transfers. OT Short Term Goal 4 (Week 1): Patient will complete UB bathing/dressing set up A using AE PRN.    Skilled Therapeutic Interventions/Progress Updates:    Pt greeted at time of session semireclined in bed resting agreeable to OT session. No pain reported throughout. Extensive discussion at beginning of session with pt describing how he "does things" at home regarding bidet toilet, LHS, and washing aide. Pt has many adaptations already. Redirection for activity for sitting and OOB activity. Donned both L knee braces Max A and dependent for ace wrapon R knee. Supine > sit 2 helpers with pt abl to manage BLEs to EOB but 2 helpers to managing trunk to come up to sitting. Donned shoes with Mod A, able to use reacher to get feet mostly in shoe and therapist assist for heel. Sit <> stand at platform RW 2 helpers from EOB elevated height Max A of 2 from very high bed, able to static stand approx 1 minute and therapist providing R knee block throughout. O2 sats dropped to 80% in standing but improved to 92%+ with pursed lip breathing and rest. On 3L throughout via [place]. Sit > supine Max A for assist with LE's, pt able to assist pulling self up to [facility] in trendelenburg position. Rolling L/R with 2 helpers for lift pad placement. Maxi ski bed > barirecliner and positioned for comfort. Family present at this time. 1x12-15 for the following with 2kg ball: chest  press, torso twist, FWD circles. Up in chair call bell in reach all needs met.    Therapy Documentation Precautions:  Precautions Precautions: Fall, Other (comment) Precaution Comments: Watch SpO2, foley catheter Required Braces or Orthoses: Other Brace Other Brace: Has bilateral knee braces/compression wrap (for stability); left knee hinged brace then places elastic sleeve over brace; rt knee prefers ace wrap tightly for support Restrictions Weight Bearing Restrictions: No     Therapy/Group: Individual Therapy  Viona Gilmore 03/06/2021, 7:11 AM

## 2021-03-06 NOTE — Progress Notes (Signed)
Patient placed on home trilogy with O2 bled in. Tolerating well.

## 2021-03-07 ENCOUNTER — Other Ambulatory Visit (HOSPITAL_COMMUNITY): Payer: Self-pay | Admitting: Cardiovascular Disease

## 2021-03-07 NOTE — Progress Notes (Addendum)
PROGRESS NOTE   Subjective/Complaints:  Incont diarrhea this am , normally takes miralax at home daily , no abd pain Breathing is ok   ROS:  Pt denies SOB, abd pain, CP, N/V/D   Objective:   DG Chest 2 View  Result Date: 03/06/2021 CLINICAL DATA:  Short of breath.  History of CHF EXAM: CHEST - 2 VIEW COMPARISON:  02/21/2021 FINDINGS: Cardiac enlargement. Dual lead pacemaker unchanged. Mild vascular congestion without edema. Bibasilar atelectasis and bilateral effusions with mild progression from the prior study. IMPRESSION: Bibasilar atelectasis and small pleural effusions with mild progression. Negative for edema. Electronically Signed   By: Franchot Gallo M.D.   On: 03/06/2021 12:49   No results for input(s): WBC, HGB, HCT, PLT in the last 72 hours.  No results for input(s): NA, K, CL, CO2, GLUCOSE, BUN, CREATININE, CALCIUM in the last 72 hours.   Intake/Output Summary (Last 24 hours) at 03/07/2021 1113 Last data filed at 03/07/2021 0717 Gross per 24 hour  Intake 760 ml  Output 310 ml  Net 450 ml      Pressure Injury 03/02/21 Sacrum Mid Stage 2 -  Partial thickness loss of dermis presenting as a shallow open injury with a red, pink wound bed without slough. Small circular area in crack (Active)  03/02/21 1720  Location: Sacrum  Location Orientation: Mid  Staging: Stage 2 -  Partial thickness loss of dermis presenting as a shallow open injury with a red, pink wound bed without slough.  Wound Description (Comments): Small circular area in crack  Present on Admission: Yes    Physical Exam: Vital Signs Blood pressure 121/75, pulse 74, temperature 98 F (36.7 C), resp. rate 16, height 6\' 2"  (1.88 m), weight (!) 173.1 kg, SpO2 94 %.   General: No acute distress Mood and affect are appropriate Heart: Regular rate and rhythm no rubs murmurs or extra sounds Lungs: Clear to auscultation, breathing unlabored, no rales or  wheezes Abdomen: Positive bowel sounds, soft nontender to palpation, nondistended Extremities: No clubbing, cyanosis, or edema Skin: No evidence of breakdown, no evidence of rash   Skin: BLE with kerlex and ACE wrap to knees  Neurological:    Mental Status: He is alert. 5/5 strenght in upper extremities, 3/5 in proximal LE, 2/5 LE DF    Assessment/Plan: 1. Functional deficits which require 3+ hours per day of interdisciplinary therapy in a comprehensive inpatient rehab setting. Physiatrist is providing close team supervision and 24 hour management of active medical problems listed below. Physiatrist and rehab team continue to assess barriers to discharge/monitor patient progress toward functional and medical goals  Care Tool:  Bathing    Body parts bathed by patient: Right arm, Left arm, Chest, Abdomen, Right upper leg, Left upper leg, Face   Body parts bathed by helper: Right lower leg, Left lower leg     Bathing assist Assist Level: Moderate Assistance - Patient 50 - 74%     Upper Body Dressing/Undressing Upper body dressing Upper body dressing/undressing activity did not occur (including orthotics): N/A What is the patient wearing?: Hospital gown only    Upper body assist Assist Level: Set up assist    Lower Body  Dressing/Undressing Lower body dressing    Lower body dressing activity did not occur: N/A What is the patient wearing?: Pants     Lower body assist Assist for lower body dressing: 2 Helpers     Toileting Toileting Toileting Activity did not occur (Clothing management and hygiene only): N/A (no void or bm)  Toileting assist Assist for toileting: Total Assistance - Patient < 25%     Transfers Chair/bed transfer  Transfers assist  Chair/bed transfer activity did not occur: Safety/medical concerns  Chair/bed transfer assist level: Dependent - mechanical lift     Locomotion Ambulation   Ambulation assist   Ambulation activity did not occur:  Safety/medical concerns  Assist level: 2 helpers Assistive device: Walker-platform Max distance: 2'   Walk 10 feet activity   Assist  Walk 10 feet activity did not occur: Safety/medical concerns        Walk 50 feet activity   Assist Walk 50 feet with 2 turns activity did not occur: Safety/medical concerns         Walk 150 feet activity   Assist Walk 150 feet activity did not occur: Safety/medical concerns         Walk 10 feet on uneven surface  activity   Assist Walk 10 feet on uneven surfaces activity did not occur: Safety/medical concerns         Wheelchair     Assist Will patient use wheelchair at discharge?: Yes Type of Wheelchair: Power Wheelchair activity did not occur: Safety/medical concerns         Wheelchair 50 feet with 2 turns activity    Assist    Wheelchair 50 feet with 2 turns activity did not occur: Safety/medical concerns       Wheelchair 150 feet activity     Assist  Wheelchair 150 feet activity did not occur: Safety/medical concerns       Blood pressure 121/75, pulse 74, temperature 98 F (36.7 C), resp. rate 16, height 6\' 2"  (1.88 m), weight (!) 173.1 kg, SpO2 94 %.  Medical Problem List and Plan: 1.  Cardiopulmonary debility with ICU myopathy- his strength is MUCH weaker than expected for just debility- His DF's are 2/5 at most- cannot put PRAFOs on due to poor condition of skin             -patient may shower             -ELOS/Goals: 2-3 weeks minA   Continue CIR- PT, OT and SLP = 2.  Antithrombotics: -DVT/anticoagulation:  Eliquis bid             -antiplatelet therapy: N/A 3. Pain Management: Continue Tylenol prn  4. Mood: LCSW to follow for evaluation and support.              -antipsychotic agents: N/A 5. Neuropsych: This patient is capable of making decisions on his own behalf. 6. Skin/Wound Care: Routine pressure relief measures.  7. Fluids/Electrolytes/Nutrition: Strict I/O. Check CMET in  am. 8. CAD/Chronic diastolic CHF: Monitor for signs of overload and check weight daily. --1500 cc FR/day. 6/16- Weight up 1.5 kg- if still increasing tomorrow, will give a dose of Lasix.  Filed Weights   03/05/21 0413 03/06/21 0323 03/07/21 0334  Weight: (!) 173.1 kg (!) 173.8 kg (!) 173.1 kg   Stable 6/18 9. COPD: Continue Breo and Incruse daily. 6/15- O2 SATS 97%- will try to wean O2- don't want O2 sats higher than 93-94% due to COPD.   6/16- Sats 88  to 94% when I was in room today on 3L- appeared comfortable- denied SOB- con't regimen 10. OHS/OSA: On BIPAP with 2 Liters bled in PTA.   11. Acute on chronic renal failure:? He denies any CKD --SCr trending back to new baseline around 1.3-1.4?             --Monitor for signs of overaload. Off lasix.  6/15- Cr 1.34- stable -con't regimen and monitoring 12. Stasis dermatitis: Continue local care   6/15- will add Eucerin BID and monitor- would suggest vaseline for home care to wife.  13. Intermittent Diarrhea: Monitor for now.   6/16- took miralax daily at home- doing OK right now, but has some gas- con't regimen 14. Urinary urgency, chronic: this is very distressing to patient, so please attend to him as soon as possible. Would benefit from briefs and q1H timed voiding while awake.     6/15- will d/c external catheter at night- the glue is distressing and doesn't want it anymore- will con't timed toileting.   15.  Bowel incont- will hold miralax, may also be colchicine but only on 1 tab daily  LOS: 5 days A FACE TO FACE EVALUATION WAS PERFORMED  Charlett Blake 03/07/2021, 11:13 AM

## 2021-03-07 NOTE — Progress Notes (Signed)
Occupational Therapy Session Note  Patient Details  Name: [name] MRN: [id number] Date of Birth: [birth year]  Today's Date: 03/07/2021 OT Individual Time: 2130-8657 and 1300-1345 OT Individual Time Calculation (min): 53 min and 45 min  Short Term Goals: Week 1:  OT Short Term Goal 1 (Week 1): Patient will complete LB dressing mod A with AE PRN and LRAD. OT Short Term Goal 2 (Week 1): Patient will complete 1/3 toileting tasks with mod A and AE PRN. OT Short Term Goal 3 (Week 1): Patient will complete sit<>stand max A of one helper in preparation for functional transfers. OT Short Term Goal 4 (Week 1): Patient will complete UB bathing/dressing set up A using AE PRN.  Skilled Therapeutic Interventions/Progress Updates:    Pt greeted in bed, pain manageable for tx and satting at 92% on 3L. First applied ACE bandages to the Rt knee to increase stability and then applied 2 braces to his Lt knee. Supine<sit completed with +2 assist and then shoes were donned EOB dependently. Worked on standing endurance and sit<stands using pts bilateral PFRW. +2 Min A for sit<stand with OT bracing his Rt knee. Pt cued for ways to improve his upright posture with pt reporting he always stands with kyphotic posturing. Also advised different positioning of RW prior to power up with pt declining to try, once again stating he always positioned his RW slightly towards his Rt side. Pt able to stand for 1 minute 25 sec, 1 minute 5 sec, and then 46 sec. 02 sats on 3L post 1st stand 93%, on 2L post 2nd stand 90-91%, on 2L post 3rd stand 92%. Pt cued to breathe out with exertion during power ups and to continue to breathe in standing for 02 safety. Bed elevated per pts comfort. After 3rd stand, pt reported that he felt very tired and needed to lie back down. +2 for transition to supine and for boosting up in bed with bed placed in trendelenburg position. At end of tx pt was repositioned for comfort, left with all needs  within reach and bed alarm set, satting at 91-93% on 3L.   2nd Session 1:1 tx (45 min) Pt greeted in bed, agreeable to bedlevel exercises since there was no +2 assistance available during session. With [place] raised, guided pt through core exercises including anterior reaches to target (longest time of sustained participation before fatiguing 1 minute 30 sec) and lower bedrail pull ups x10 reps 3 sets. Transitioned to UB strengthening using the 6.6lb ball x10 reps each exercise x2 sets. All exercises were modified to accommodate Lt shoulder limitations/pain. Also instructed pt to use diaphragmatic breathing strategies for cardiopulmonary health. 02 sats at start of session 85% on 3L but pt was only wearing the [place] in one of his nostrils. 02 sats remained between 90-95% for remainder of session on 3L 02. Guided him through UB stretches as well to decrease muscular tension, forward/backward shoulder rolls, shoulder shrugs, scapular pinches 5-10 reps. At end of tx pt required +2 for boosting up in bed. Left him with all needs within reach and bed alarm set.  Therapy Documentation Precautions:  Precautions Precautions: Fall, Other (comment) Precaution Comments: Watch SpO2, foley catheter Required Braces or Orthoses: Other Brace Other Brace: Has bilateral knee braces/compression wrap (for stability); left knee hinged brace then places elastic sleeve over brace; rt knee prefers ace wrap tightly for support Restrictions Weight Bearing Restrictions: No   Pain: Pain Assessment Pain Scale: 0-10 Pain Score: 5  Pain Type: Acute  pain Pain Location: Wrist Pain Orientation: Left Pain Descriptors / Indicators: Burning Pain Onset: Gradual Pain Intervention(s): Medication (See eMAR) ADL: ADL Equipment Provided:  (Pt owns and regularly uses reacher, long-handled sponges) Eating: Set up Where Assessed-Eating: Bed level Grooming: Setup Where Assessed-Grooming: Edge of bed Upper Body Bathing: Minimal  assistance Where Assessed-Upper Body Bathing: Edge of bed Lower Body Bathing: Moderate assistance Where Assessed-Lower Body Bathing: Edge of bed Upper Body Dressing: Setup Where Assessed-Upper Body Dressing: Edge of bed Lower Body Dressing: Maximal assistance Where Assessed-Lower Body Dressing: Edge of bed, Other (Comment) (with platform walker in stance) Toileting: Not assessed (Pt using foley catheter at eval) Toilet Transfer: Not assessed Tub/Shower Transfer: Not assessed Social research officer, government: Not assessed ADL Comments: Pt demoing difficulties with sit<>stand using platform walker; transfers discontinued at eval due to BLE weakness and unsteadiness on feet. Pt does not want to take showers as he sponge bathes at home on toilet. Uses a lot of AE/problem solving at home to complete ADLs as independently as possible, but still receives help for pulling up pants in standing and donning footwear.    Therapy/Group: Individual Therapy  [name] [name] 03/07/2021, 12:48 PM

## 2021-03-07 NOTE — Progress Notes (Signed)
Physical Therapy Session Note  Patient Details  Name: [name] MRN: [id number] Date of Birth: [birth year]  Today's Date: 03/07/2021 PT Individual Time: 2831-5176 and 1607-3710 PT Individual Time Calculation (min): 47 min and 25 min  Short Term Goals: Week 1:  PT Short Term Goal 1 (Week 1): Patient to perform supine to sit with mod A of 1. PT Short Term Goal 2 (Week 1): Patient to perform sit to stand from higher surface with mod A of 2 for safety x 3 reps PT Short Term Goal 3 (Week 1): Patient to perform stand step to shuttle chair with PFRW and +2 A PT Short Term Goal 4 (Week 1): Patient to perform sit to supine with mod A of 2  Skilled Therapeutic Interventions/Progress Updates:   Treatment Session 1 Received pt semi-reclined in bed, pt agreeable to PT treatment, and denied any pain during session but reported having a "crazy" night from all his alarms beeping. Pt on 2L O2 with O2 sat 87% increasing to 91% with cues for pursed lip breathing; remained on 2L throughout session. Session with emphasis on bed mobility, functional mobility/transfers, generalized strengthening, dynamic standing balance/coordination, toileting, and improved activity tolerance. Donned both L knee sleeves, shoes, and ace wrapped R knee with total A +2 to hold limbs and pt transferred semi-reclined<>sitting EOB with max A +2. Sit<>stand with PFRW and min/mod A +2 x 2 trials from elevated bed with therapist guarding R knee but no buckling noted. Trial 1: pt able to stand for 1 minute and trial 2: pt stood for ~20 seconds prior to reporting onset of loose stool. Returned to sitting and transferred sit<>supine with +2 assist and required heavy +2 assist and use of Trendelenburg bed position to scoot to [facility]. Rolled L/R with +2 assist and pt was dependent to remove soiled brief and begin peri-care. Left pt in care of 2 NTs to finish peri-care due to time restrictions.   Treatment Session 2 Received pt semi-reclined in  bed with family present at bedside, pt agreeable to PT treatment, and denied any pain during session and pleased with his standing progress today. Pt on 3L O2 with O2 sat 95%. Decreased to 2L and pt's O2 sat remained above 88% throughout session and increased up to 96% during activity with cues for pursed lip breathing. Session with emphasis on generalized strengthening and improved activity tolerance. Pt performed the following exercises semi-reclined in bed with supervision and verbal cues for technique: -ankle circles x20 clockwise/counterclockwise bilaterally (significantly decreased ROM in all planes with pt reporting absent sensation in BLEs) -SLR x10 bilaterally (decreased ROM due to body habitus and weakness) -hip abduction x10 bilaterally -hip adduction pillow squeezes 10x5 second isometric hold Pt reported fatigue after exercises and stated "this is why I tell people I need to stand before I do leg exercises, because I get too tired". Concluded session with pt semi-reclined in bed, needs within reach, and bed alarm on.   Therapy Documentation Precautions:  Precautions Precautions: Fall, Other (comment) Precaution Comments: Watch SpO2, foley catheter Required Braces or Orthoses: Other Brace Other Brace: Has bilateral knee braces/compression wrap (for stability); left knee hinged brace then places elastic sleeve over brace; rt knee prefers ace wrap tightly for support Restrictions Weight Bearing Restrictions: No  Therapy/Group: Individual Therapy Alfonse Alpers PT, DPT   03/07/2021, 7:14 AM

## 2021-03-07 NOTE — Plan of Care (Signed)
  Problem: Consults Goal: RH GENERAL PATIENT EDUCATION Description: See Patient Education module for education specifics. Outcome: Progressing Goal: Nutrition Consult-if indicated Outcome: Progressing   Problem: RH BOWEL ELIMINATION Goal: RH STG MANAGE BOWEL WITH ASSISTANCE Description: STG Manage Bowel with mod I  Assistance. Outcome: Progressing   Problem: RH BLADDER ELIMINATION Goal: RH STG MANAGE BLADDER WITH ASSISTANCE Description: STG Manage Bladder With mod I  Assistance Outcome: Progressing   Problem: RH SKIN INTEGRITY Goal: RH STG SKIN FREE OF INFECTION/BREAKDOWN Description: Maintain skin with min assist Outcome: Progressing Goal: RH STG MAINTAIN SKIN INTEGRITY WITH ASSISTANCE Description: STG Maintain Skin Integrity With min Assistance. Outcome: Progressing Goal: RH STG ABLE TO PERFORM INCISION/WOUND CARE W/ASSISTANCE Description: STG Able To Perform Incision/Wound Care With min Assistance. Outcome: Progressing   Problem: RH SAFETY Goal: RH STG ADHERE TO SAFETY PRECAUTIONS W/ASSISTANCE/DEVICE Description: STG Adhere to Safety Precautions With cues/reminders  Assistance/Device. Outcome: Progressing   Problem: RH PAIN MANAGEMENT Goal: RH STG PAIN MANAGED AT OR BELOW PT'S PAIN GOAL Description: At or below level 4 Outcome: Progressing   Problem: RH KNOWLEDGE DEFICIT GENERAL Goal: RH STG INCREASE KNOWLEDGE OF SELF CARE AFTER HOSPITALIZATION Description: Patient will be able to direct caregivers with care needs using handouts and educational resources independently at discharge Outcome: Progressing

## 2021-03-08 NOTE — Plan of Care (Signed)
  Problem: Consults Goal: RH GENERAL PATIENT EDUCATION Description: See Patient Education module for education specifics. Outcome: Progressing Goal: Nutrition Consult-if indicated Outcome: Progressing   Problem: RH BOWEL ELIMINATION Goal: RH STG MANAGE BOWEL WITH ASSISTANCE Description: STG Manage Bowel with mod I  Assistance. Outcome: Progressing   Problem: RH BLADDER ELIMINATION Goal: RH STG MANAGE BLADDER WITH ASSISTANCE Description: STG Manage Bladder With mod I  Assistance Outcome: Progressing   Problem: RH SKIN INTEGRITY Goal: RH STG SKIN FREE OF INFECTION/BREAKDOWN Description: Maintain skin with min assist Outcome: Progressing Goal: RH STG MAINTAIN SKIN INTEGRITY WITH ASSISTANCE Description: STG Maintain Skin Integrity With min Assistance. Outcome: Progressing Goal: RH STG ABLE TO PERFORM INCISION/WOUND CARE W/ASSISTANCE Description: STG Able To Perform Incision/Wound Care With min Assistance. Outcome: Progressing   Problem: RH SAFETY Goal: RH STG ADHERE TO SAFETY PRECAUTIONS W/ASSISTANCE/DEVICE Description: STG Adhere to Safety Precautions With cues/reminders  Assistance/Device. Outcome: Progressing   Problem: RH PAIN MANAGEMENT Goal: RH STG PAIN MANAGED AT OR BELOW PT'S PAIN GOAL Description: At or below level 4 Outcome: Progressing   Problem: RH KNOWLEDGE DEFICIT GENERAL Goal: RH STG INCREASE KNOWLEDGE OF SELF CARE AFTER HOSPITALIZATION Description: Patient will be able to direct caregivers with care needs using handouts and educational resources independently at discharge Outcome: Progressing

## 2021-03-08 NOTE — Progress Notes (Signed)
PROGRESS NOTE   Subjective/Complaints:  Patient without further diarrhea episodes.  MiraLAX held yesterday, continues on colchicine ROS:  Pt denies SOB, abd pain, CP, N/V/D   Objective:   DG Chest 2 View  Result Date: 03/06/2021 CLINICAL DATA:  Short of breath.  History of CHF EXAM: CHEST - 2 VIEW COMPARISON:  02/21/2021 FINDINGS: Cardiac enlargement. Dual lead pacemaker unchanged. Mild vascular congestion without edema. Bibasilar atelectasis and bilateral effusions with mild progression from the prior study. IMPRESSION: Bibasilar atelectasis and small pleural effusions with mild progression. Negative for edema. Electronically Signed   By: Franchot Gallo M.D.   On: 03/06/2021 12:49   No results for input(s): WBC, HGB, HCT, PLT in the last 72 hours.  No results for input(s): NA, K, CL, CO2, GLUCOSE, BUN, CREATININE, CALCIUM in the last 72 hours.   Intake/Output Summary (Last 24 hours) at 03/08/2021 0713 Last data filed at 03/08/2021 0510 Gross per 24 hour  Intake 715 ml  Output 575 ml  Net 140 ml      Pressure Injury 03/02/21 Sacrum Mid Stage 2 -  Partial thickness loss of dermis presenting as a shallow open injury with a red, pink wound bed without slough. Small circular area in crack (Active)  03/02/21 1720  Location: Sacrum  Location Orientation: Mid  Staging: Stage 2 -  Partial thickness loss of dermis presenting as a shallow open injury with a red, pink wound bed without slough.  Wound Description (Comments): Small circular area in crack  Present on Admission: Yes    Physical Exam: Vital Signs Blood pressure 133/69, pulse 60, temperature 97.7 F (36.5 C), temperature source Oral, resp. rate 20, height 6\' 2"  (1.88 m), weight (!) 172.8 kg, SpO2 95 %.  General: No acute distress Mood and affect are appropriate Heart: Regular rate and rhythm no rubs murmurs or extra sounds Lungs: Clear to auscultation, breathing  unlabored, no rales or wheezes Abdomen: Positive bowel sounds, soft nontender to palpation, nondistended Extremities: No clubbing, cyanosis, or edema   Skin: BLE with kerlex single layer feet to knees, no weeping, stasis dermatitis changes seen Neurological:    Mental Status: He is alert. 5/5 strenght in upper extremities, 3/5 in proximal LE, 2/5 LE DF    Assessment/Plan: 1. Functional deficits which require 3+ hours per day of interdisciplinary therapy in a comprehensive inpatient rehab setting. Physiatrist is providing close team supervision and 24 hour management of active medical problems listed below. Physiatrist and rehab team continue to assess barriers to discharge/monitor patient progress toward functional and medical goals  Care Tool:  Bathing    Body parts bathed by patient: Right arm, Left arm, Chest, Abdomen, Right upper leg, Left upper leg, Face   Body parts bathed by helper: Right lower leg, Left lower leg     Bathing assist Assist Level: Moderate Assistance - Patient 50 - 74%     Upper Body Dressing/Undressing Upper body dressing Upper body dressing/undressing activity did not occur (including orthotics): N/A What is the patient wearing?: Hospital gown only    Upper body assist Assist Level: Set up assist    Lower Body Dressing/Undressing Lower body dressing    Lower body dressing  activity did not occur: N/A What is the patient wearing?: Pants     Lower body assist Assist for lower body dressing: 2 Helpers     Toileting Toileting Toileting Activity did not occur (Clothing management and hygiene only): N/A (no void or bm)  Toileting assist Assist for toileting: Total Assistance - Patient < 25%     Transfers Chair/bed transfer  Transfers assist  Chair/bed transfer activity did not occur: Safety/medical concerns  Chair/bed transfer assist level: Dependent - mechanical lift     Locomotion Ambulation   Ambulation assist   Ambulation activity  did not occur: Safety/medical concerns  Assist level: 2 helpers Assistive device: Walker-platform Max distance: 2'   Walk 10 feet activity   Assist  Walk 10 feet activity did not occur: Safety/medical concerns        Walk 50 feet activity   Assist Walk 50 feet with 2 turns activity did not occur: Safety/medical concerns         Walk 150 feet activity   Assist Walk 150 feet activity did not occur: Safety/medical concerns         Walk 10 feet on uneven surface  activity   Assist Walk 10 feet on uneven surfaces activity did not occur: Safety/medical concerns         Wheelchair     Assist Will patient use wheelchair at discharge?: Yes Type of Wheelchair: Power Wheelchair activity did not occur: Safety/medical concerns         Wheelchair 50 feet with 2 turns activity    Assist    Wheelchair 50 feet with 2 turns activity did not occur: Safety/medical concerns       Wheelchair 150 feet activity     Assist  Wheelchair 150 feet activity did not occur: Safety/medical concerns       Blood pressure 133/69, pulse 60, temperature 97.7 F (36.5 C), temperature source Oral, resp. rate 20, height 6\' 2"  (1.88 m), weight (!) 172.8 kg, SpO2 95 %.  Medical Problem List and Plan: 1.  Cardiopulmonary debility with ICU myopathy- his strength is MUCH weaker than expected for just debility- His DF's are 2/5 at most- cannot put PRAFOs on due to poor condition of skin             -patient may shower             -ELOS/Goals: 2-3 weeks minA   Continue CIR- PT, OT and SLP = 2.  Antithrombotics: -DVT/anticoagulation:  Eliquis bid             -antiplatelet therapy: N/A 3. Pain Management: Continue Tylenol prn  4. Mood: LCSW to follow for evaluation and support.              -antipsychotic agents: N/A 5. Neuropsych: This patient is capable of making decisions on his own behalf. 6. Skin/Wound Care: Routine pressure relief measures.  7.  Fluids/Electrolytes/Nutrition: Strict I/O. Check CMET in am. 8. CAD/Chronic diastolic CHF: Monitor for signs of overload and check weight daily. --1500 cc FR/day. 6/16- Weight up 1.5 kg- if still increasing tomorrow, will give a dose of Lasix.  Filed Weights   03/06/21 0323 03/07/21 0334 03/08/21 0619  Weight: (!) 173.8 kg (!) 173.1 kg (!) 172.8 kg   Stable 6/19 9. COPD: Continue Breo and Incruse daily. 6/15- O2 SATS 97%- will try to wean O2- don't want O2 sats higher than 93-94% due to COPD.   6/16- Sats 88 to 94% when I was in room  today on 3L- appeared comfortable- denied SOB- con't regimen 10. OHS/OSA: On BIPAP with 2 Liters bled in PTA.   11. Acute on chronic renal failure:? He denies any CKD --SCr trending back to new baseline around 1.3-1.4?             --Monitor for signs of overaload. Off lasix.  6/15- Cr 1.34- stable -con't regimen and monitoring 12. Stasis dermatitis: Continue local care   6/15- will add Eucerin BID and monitor- would suggest vaseline for home care to wife.  13. Intermittent Diarrhea: Monitor for now.   6/16- took miralax daily at home- doing OK right now, but has some gas- con't regimen 14. Urinary urgency, chronic: this is very distressing to patient, so please attend to him as soon as possible. Would benefit from briefs and q1H timed voiding while awake.     6/15- will d/c external catheter at night- the glue is distressing and doesn't want it anymore- will con't timed toileting.   15.  Bowel incont- will hold miralax, also on colchicine but only on 1 tab daily  LOS: 6 days A FACE TO FACE EVALUATION WAS PERFORMED  Charlett Blake 03/08/2021, 7:13 AM

## 2021-03-09 ENCOUNTER — Ambulatory Visit: Payer: Medicare HMO | Admitting: Internal Medicine

## 2021-03-09 ENCOUNTER — Telehealth: Payer: Self-pay

## 2021-03-09 LAB — CBC
HCT: 37.8 % — ABNORMAL LOW (ref 39.0–52.0)
Hemoglobin: 12 g/dL — ABNORMAL LOW (ref 13.0–17.0)
MCH: 31.8 pg (ref 26.0–34.0)
MCHC: 31.7 g/dL (ref 30.0–36.0)
MCV: 100.3 fL — ABNORMAL HIGH (ref 80.0–100.0)
Platelets: 147 10*3/uL — ABNORMAL LOW (ref 150–400)
RBC: 3.77 MIL/uL — ABNORMAL LOW (ref 4.22–5.81)
RDW: 15.9 % — ABNORMAL HIGH (ref 11.5–15.5)
WBC: 5.1 10*3/uL (ref 4.0–10.5)
nRBC: 0 % (ref 0.0–0.2)

## 2021-03-09 LAB — BASIC METABOLIC PANEL
Anion gap: 8 (ref 5–15)
BUN: 16 mg/dL (ref 8–23)
CO2: 34 mmol/L — ABNORMAL HIGH (ref 22–32)
Calcium: 9.3 mg/dL (ref 8.9–10.3)
Chloride: 99 mmol/L (ref 98–111)
Creatinine, Ser: 1.27 mg/dL — ABNORMAL HIGH (ref 0.61–1.24)
GFR, Estimated: 58 mL/min — ABNORMAL LOW (ref >60)
Glucose, Bld: 93 mg/dL (ref 70–99)
Potassium: 4.2 mmol/L (ref 3.5–5.1)
Sodium: 141 mmol/L (ref 135–145)

## 2021-03-09 MED ORDER — FUROSEMIDE 20 MG PO TABS
20.0000 mg | ORAL_TABLET | Freq: Once | ORAL | Status: AC
  Administered 2021-03-09: 20 mg via ORAL
  Filled 2021-03-09: qty 1

## 2021-03-09 NOTE — Progress Notes (Signed)
Patient placed on home BIPAP at this time. 3L bled in and heater turned on for patient.

## 2021-03-09 NOTE — Progress Notes (Signed)
PROGRESS NOTE   Subjective/Complaints: Patient's chart reviewed- No issues reported overnight Vitals signs stable  Hgb 12.0, improved from 10.8 Cr 1.27, improved from 1.34  ROS:  Pt denies SOB, abd pain, CP, N/V/D   Objective:   No results found. Recent Labs    03/09/21 0647  WBC 5.1  HGB 12.0*  HCT 37.8*  PLT 147*   Recent Labs    03/09/21 0647  NA 141  K 4.2  CL 99  CO2 34*  GLUCOSE 93  BUN 16  CREATININE 1.27*  CALCIUM 9.3    Intake/Output Summary (Last 24 hours) at 03/09/2021 1035 Last data filed at 03/09/2021 2248 Gross per 24 hour  Intake 558 ml  Output 550 ml  Net 8 ml     Pressure Injury 03/02/21 Sacrum Mid Stage 2 -  Partial thickness loss of dermis presenting as a shallow open injury with a red, pink wound bed without slough. Small circular area in crack (Active)  03/02/21 1720  Location: Sacrum  Location Orientation: Mid  Staging: Stage 2 -  Partial thickness loss of dermis presenting as a shallow open injury with a red, pink wound bed without slough.  Wound Description (Comments): Small circular area in crack  Present on Admission: Yes    Physical Exam: Vital Signs Blood pressure 135/75, pulse 60, temperature 98.7 F (37.1 C), resp. rate 18, height 6\' 2"  (1.88 m), weight (!) 174.4 kg, SpO2 96 %. Gen: no distress, normal appearing HEENT: oral mucosa pink and moist, NCAT Cardio: Reg rate Chest: normal effort, normal rate of breathing Abd: soft, non-distended Ext: no edema Psych: pleasant, normal affect  Skin: BLE with kerlex single layer feet to knees, no weeping, stasis dermatitis changes seen Neurological:    Mental Status: He is alert. 5/5 strenght in upper extremities, 3/5 in proximal LE, 2/5 LE DF    Assessment/Plan: 1. Functional deficits which require 3+ hours per day of interdisciplinary therapy in a comprehensive inpatient rehab setting. Physiatrist is providing close  team supervision and 24 hour management of active medical problems listed below. Physiatrist and rehab team continue to assess barriers to discharge/monitor patient progress toward functional and medical goals  Care Tool:  Bathing    Body parts bathed by patient: Right arm, Left arm, Chest, Abdomen, Right upper leg, Left upper leg, Face   Body parts bathed by helper: Right lower leg, Left lower leg     Bathing assist Assist Level: Moderate Assistance - Patient 50 - 74%     Upper Body Dressing/Undressing Upper body dressing Upper body dressing/undressing activity did not occur (including orthotics): N/A What is the patient wearing?: Hospital gown only    Upper body assist Assist Level: Set up assist    Lower Body Dressing/Undressing Lower body dressing    Lower body dressing activity did not occur: N/A What is the patient wearing?: Pants     Lower body assist Assist for lower body dressing: 2 Helpers     Toileting Toileting Toileting Activity did not occur (Clothing management and hygiene only): N/A (no void or bm)  Toileting assist Assist for toileting: Total Assistance - Patient < 25%     Transfers Chair/bed  transfer  Transfers assist  Chair/bed transfer activity did not occur: Safety/medical concerns  Chair/bed transfer assist level: Dependent - mechanical lift     Locomotion Ambulation   Ambulation assist   Ambulation activity did not occur: Safety/medical concerns  Assist level: 2 helpers Assistive device: Walker-platform Max distance: 2'   Walk 10 feet activity   Assist  Walk 10 feet activity did not occur: Safety/medical concerns        Walk 50 feet activity   Assist Walk 50 feet with 2 turns activity did not occur: Safety/medical concerns         Walk 150 feet activity   Assist Walk 150 feet activity did not occur: Safety/medical concerns         Walk 10 feet on uneven surface  activity   Assist Walk 10 feet on uneven  surfaces activity did not occur: Safety/medical concerns         Wheelchair     Assist Will patient use wheelchair at discharge?: Yes Type of Wheelchair: Power Wheelchair activity did not occur: Safety/medical concerns         Wheelchair 50 feet with 2 turns activity    Assist    Wheelchair 50 feet with 2 turns activity did not occur: Safety/medical concerns       Wheelchair 150 feet activity     Assist  Wheelchair 150 feet activity did not occur: Safety/medical concerns       Blood pressure 135/75, pulse 60, temperature 98.7 F (37.1 C), resp. rate 18, height 6\' 2"  (1.88 m), weight (!) 174.4 kg, SpO2 96 %.  Medical Problem List and Plan: 1.  Cardiopulmonary debility with ICU myopathy- his strength is MUCH weaker than expected for just debility- His DF's are 2/5 at most- cannot put PRAFOs on due to poor condition of skin             -patient may shower             -ELOS/Goals: 2-3 weeks minA   Continue CIR- PT, OT and SLP 2.  Antithrombotics: -DVT/anticoagulation:  Eliquis bid             -antiplatelet therapy: N/A 3. Pain Management: Continue Tylenol prn  4. Mood: LCSW to follow for evaluation and support.              -antipsychotic agents: N/A 5. Neuropsych: This patient is capable of making decisions on his own behalf. 6. Skin/Wound Care: Routine pressure relief measures.  7. Fluids/Electrolytes/Nutrition: Strict I/O. Check CMET in am. 8. CAD/Chronic diastolic CHF: Monitor for signs of overload and check weight daily. --1500 cc FR/day. 6/20: weight increased 1.5kg: given 20mg  Lasix Filed Weights   03/07/21 0334 03/08/21 0619 03/09/21 0438  Weight: (!) 173.1 kg (!) 172.8 kg (!) 174.4 kg   9. COPD: Continue Breo and Incruse daily. 6/15- O2 SATS 97%- will try to wean O2- don't want O2 sats higher than 93-94% due to COPD.   6/16- Sats 88 to 94% when I was in room today on 3L- appeared comfortable- denied SOB- con't regimen 10. OHS/OSA: On BIPAP with  2 Liters bled in PTA.   11. Acute on chronic renal failure:? He denies any CKD --SCr trending back to new baseline around 1.3-1.4?             --Monitor for signs of overaload. Off lasix.  6/20 Cr down to 1.27, continue to monitor especially with additional doses of Lasix PRN.  12. Stasis dermatitis: Continue  local care   6/15- will add Eucerin BID and monitor- would suggest vaseline for home care to wife.  13. Intermittent Diarrhea: Monitor for now.   6/16- took miralax daily at home- doing OK right now, but has some gas- con't regimen 14. Urinary urgency, chronic: this is very distressing to patient, so please attend to him as soon as possible. Would benefit from briefs and q1H timed voiding while awake.     6/15- will d/c external catheter at night- the glue is distressing and doesn't want it anymore- will con't timed toileting.   15.  Bowel incont- will hold miralax, also on colchicine but only on 1 tab daily 16. Acute blood loss anemia: Hgb 12.0, up from 10.8, monitor weekly  LOS: 7 days A FACE TO FACE EVALUATION WAS PERFORMED  Martha Clan P Sumeet Geter 03/09/2021, 10:35 AM

## 2021-03-09 NOTE — Telephone Encounter (Signed)
Pt's age 80, wt 174.4 kg SCr 1.27, CrCl 118.25, last SW 04/22/21.

## 2021-03-09 NOTE — Telephone Encounter (Signed)
FYI: pt canceled upcoming appointment due to pt being in rehab facility.

## 2021-03-09 NOTE — Progress Notes (Signed)
Physical Therapy Session Note  Patient Details  Name: [name] MRN: [id number] Date of Birth: [birth year]  Today's Date: 03/09/2021 PT Individual Time: 1448-1600 PT Individual Time Calculation (min): 72 min   Short Term Goals: Week 1:  PT Short Term Goal 1 (Week 1): Patient to perform supine to sit with mod A of 1. PT Short Term Goal 2 (Week 1): Patient to perform sit to stand from higher surface with mod A of 2 for safety x 3 reps PT Short Term Goal 3 (Week 1): Patient to perform stand step to shuttle chair with PFRW and +2 A PT Short Term Goal 4 (Week 1): Patient to perform sit to supine with mod A of 2  Skilled Therapeutic Interventions/Progress Updates:    Patient up in shuttle chair, reports up since 11 am.  States worked on standing up to 4 minutes earlier today.  Maintained on 2 L O2 throughout session with SpO2 after ambulation 91%. Patient seated for ant/post rocking reaching arms forward for trunk strengthening x 10.  Donned ace wrap to R knee and readjusted neoprene braces on L knee while seated. Sit to stand to B PFRW with +2 mod A and momentum, ambulated x 6-7' forward, then backwards x 2 trials with PFRW and +2 A for safety min to mod A overall.  Patient performing reciprocating movements of walker to back up due to placing weight on walker.  A on R knee for safety to prevent buckling, but none noted. Patient sit to stand for stepping to bed again with +2 A for safety.  Turning and backing up to bed with +1 A as second assist moving chair out of the way.  Patient side step toward [facility] as well.  Overall about 5' to back up to bed.  Patient sit to supine with +2 max A for legs and guiding trunk to pillow.  Patient scooting up in bed with +2 mod A and bed in trendelenberg, pt pulling up on headboard.  Requesting to toilet so rolled with max A and placed bed pan.  Patient completed toileting.  NT in to assist off bed pan and with hygiene with total A.  Patient assisted to scoot to  [facility] again.  Completed LE therex including ankle pumps AAROM, heel slides and hip abduction all x 10.  Patient left with call bell and needs in reach and bed alarm set.    Therapy Documentation Precautions:  Precautions Precautions: Fall, Other (comment) Precaution Comments: Watch SpO2, foley catheter Required Braces or Orthoses: Other Brace Other Brace: Has bilateral knee braces/compression wrap (for stability); left knee hinged brace then places elastic sleeve over brace; rt knee prefers ace wrap tightly for support Restrictions Weight Bearing Restrictions: No  Pain: Pain Assessment Pain Score: 0-No pain   Therapy/Group: Individual Therapy  Reginia Naas [place], [place] 03/09/2021, 5:07 PM

## 2021-03-09 NOTE — Progress Notes (Signed)
Physical Therapy Session Note  Patient Details  Name: Eziah Negro MRN: [id number] Date of Birth: 1941-05-16  Today's Date: 03/09/2021 PT Individual Time: 0800-0900 PT Individual Time Calculation (min): 60 min   Short Term Goals: Week 1:  PT Short Term Goal 1 (Week 1): Patient to perform supine to sit with mod A of 1. PT Short Term Goal 2 (Week 1): Patient to perform sit to stand from higher surface with mod A of 2 for safety x 3 reps PT Short Term Goal 3 (Week 1): Patient to perform stand step to shuttle chair with PFRW and +2 A PT Short Term Goal 4 (Week 1): Patient to perform sit to supine with mod A of 2  Skilled Therapeutic Interventions/Progress Updates:    Patient received reclined in bed, agreeable to PT. He denies pain. PT donning B knee braces and shoes MaxA/TotalA. He was able to come sit edge of bed with MaxA x1 and max verbal cues for sequencing and to breathe through movement. Patient with noted SOB after this exertion requiring rest break EOB. O2 on 2L via [place] fluctuating between 86%-94% with slow recover when it dropped despite verbal cues for pursed lip breathing. Patient completing x3 sit <> stand from elevated edge of bed. He reports very high bed at home and prefers to practice from that height. He required MinA x2 and BPRW. Patient able to remain standing ~2 mins with intermittent R knee blocking. Upon 2nd stand, patient attempting to complete lateral stepping toward head of bed, but was unable to full shift weight to R LE to allow L lateral advancement. Patient also attempting lateral scoots seated on bed, but was unable to. TotalA x2 to return supine and reposition in bed. Bed alarm on, call light within reach.  Therapy Documentation Precautions:  Precautions Precautions: Fall, Other (comment) Precaution Comments: Watch SpO2, foley catheter Required Braces or Orthoses: Other Brace Other Brace: Has bilateral knee braces/compression wrap (for stability); left knee  hinged brace then places elastic sleeve over brace; rt knee prefers ace wrap tightly for support Restrictions Weight Bearing Restrictions: No     Therapy/Group: Individual Therapy  Karoline Caldwell, PT, DPT, CBIS  03/09/2021, 7:45 AM

## 2021-03-09 NOTE — Progress Notes (Signed)
Occupational Therapy Session Note  Patient Details  Name: [name] MRN: [id number] Date of Birth: [birth year]  Today's Date: 03/09/2021 OT Individual Time: 1018-1130 OT Individual Time Calculation (min): 72 min    Short Term Goals: Week 1:  OT Short Term Goal 1 (Week 1): Patient will complete LB dressing mod A with AE PRN and LRAD. OT Short Term Goal 2 (Week 1): Patient will complete 1/3 toileting tasks with mod A and AE PRN. OT Short Term Goal 3 (Week 1): Patient will complete sit<>stand max A of one helper in preparation for functional transfers. OT Short Term Goal 4 (Week 1): Patient will complete UB bathing/dressing set up A using AE PRN.  Skilled Therapeutic Interventions/Progress Updates:    Patient in bed, alert and ready for therapy session.  He declined lower body dressing.  Sneakers and knee brace donned prior to this session.  Applied right knee ace wrap.  Min A to change hospital gown.  Oral care with set up in unsupported sitting position.  Supine to sitting edge of bed with min A.  Sit to stand from elevated bed surface CGA +2 for 2 attempts - able to maintain standing for 4 minutes each attempt with right hand reaching and weight shifts.  Sit to supine max a x2.  Mod a to roll right and left for adjustment of brief and to place lift sling.  Utilized OH lift for transfer bed to bariatric chair.  Completed upper body AROM/conditioning exercises.  He remained seated in chair at close of session, call bell and chair adjust remote in hand,    Therapy Documentation Precautions:  Precautions Precautions: Fall, Other (comment) Precaution Comments: Watch SpO2, foley catheter Required Braces or Orthoses: Other Brace Other Brace: Has bilateral knee braces/compression wrap (for stability); left knee hinged brace then places elastic sleeve over brace; rt knee prefers ace wrap tightly for support Restrictions Weight Bearing Restrictions: No   Therapy/Group: Individual  Therapy  [name] 03/09/2021, 7:37 AM

## 2021-03-10 LAB — GLUCOSE, CAPILLARY: Glucose-Capillary: 114 mg/dL — ABNORMAL HIGH (ref 70–99)

## 2021-03-10 NOTE — Progress Notes (Signed)
Patient ID: [name], male   DOB: 04/12/1941, 79 y.o.   MRN: [id number]  Met with pt, wife and son to discuss team conference goals CGA, with discharge 7/5. Pt asked if can get a knee brace for his knee which buckles at times. Will ask PT. Has all equipment from previous admissions and active with home health agency. Continue to work on discharge needs. 

## 2021-03-10 NOTE — Progress Notes (Signed)
PROGRESS NOTE   Subjective/Complaints: No complaints this morning Hgb improving to 12.0 Cr improved to 1.27, discussed with him Lasix increased his urinary frequency  ROS:  Pt denies SOB, abd pain, CP, N/V/D, +urinary frequency.    Objective:   No results found. Recent Labs    03/09/21 0647  WBC 5.1  HGB 12.0*  HCT 37.8*  PLT 147*   Recent Labs    03/09/21 0647  NA 141  K 4.2  CL 99  CO2 34*  GLUCOSE 93  BUN 16  CREATININE 1.27*  CALCIUM 9.3    Intake/Output Summary (Last 24 hours) at 03/10/2021 1215 Last data filed at 03/10/2021 0844 Gross per 24 hour  Intake 718 ml  Output 275 ml  Net 443 ml     Pressure Injury 03/02/21 Sacrum Mid Stage 2 -  Partial thickness loss of dermis presenting as a shallow open injury with a red, pink wound bed without slough. Small circular area in crack (Active)  03/02/21 1720  Location: Sacrum  Location Orientation: Mid  Staging: Stage 2 -  Partial thickness loss of dermis presenting as a shallow open injury with a red, pink wound bed without slough.  Wound Description (Comments): Small circular area in crack  Present on Admission: Yes    Physical Exam: Vital Signs Blood pressure 122/69, pulse 60, temperature 98.4 F (36.9 C), temperature source Oral, resp. rate 19, height 6\' 2"  (1.88 m), weight (!) 168.1 kg, SpO2 92 %. Gen: no distress, normal appearing HEENT: oral mucosa pink and moist, NCAT Cardio: Reg rate Chest: normal effort, normal rate of breathing Abd: soft, non-distended Ext: no edema Psych: pleasant, normal affect  Skin: BLE with kerlex single layer feet to knees, no weeping, stasis dermatitis changes seen Neurological:    Mental Status: He is alert. 5/5 strenght in upper extremities, 3/5 in proximal LE, 2/5 LE DF    Assessment/Plan: 1. Functional deficits which require 3+ hours per day of interdisciplinary therapy in a comprehensive inpatient rehab  setting. Physiatrist is providing close team supervision and 24 hour management of active medical problems listed below. Physiatrist and rehab team continue to assess barriers to discharge/monitor patient progress toward functional and medical goals  Care Tool:  Bathing    Body parts bathed by patient: Right arm, Left arm, Chest, Abdomen, Right upper leg, Left upper leg, Face   Body parts bathed by helper: Right lower leg, Left lower leg     Bathing assist Assist Level: Moderate Assistance - Patient 50 - 74%     Upper Body Dressing/Undressing Upper body dressing Upper body dressing/undressing activity did not occur (including orthotics): N/A What is the patient wearing?: Hospital gown only    Upper body assist Assist Level: Set up assist    Lower Body Dressing/Undressing Lower body dressing    Lower body dressing activity did not occur: N/A What is the patient wearing?:  (L knee brace)     Lower body assist Assist for lower body dressing: Independent with assitive device Assistive Device Comment: reacher to don L knee brace   Toileting Toileting Toileting Activity did not occur (Clothing management and hygiene only): N/A (no void or bm)  Toileting assist Assist for toileting: Total Assistance - Patient < 25%     Transfers Chair/bed transfer  Transfers assist  Chair/bed transfer activity did not occur: Safety/medical concerns  Chair/bed transfer assist level: Contact Guard/Touching assist     Locomotion Ambulation   Ambulation assist   Ambulation activity did not occur: Safety/medical concerns  Assist level: 2 helpers Assistive device: Walker-platform Max distance: 2'   Walk 10 feet activity   Assist  Walk 10 feet activity did not occur: Safety/medical concerns        Walk 50 feet activity   Assist Walk 50 feet with 2 turns activity did not occur: Safety/medical concerns         Walk 150 feet activity   Assist Walk 150 feet activity did  not occur: Safety/medical concerns         Walk 10 feet on uneven surface  activity   Assist Walk 10 feet on uneven surfaces activity did not occur: Safety/medical concerns         Wheelchair     Assist Will patient use wheelchair at discharge?: Yes Type of Wheelchair: Power Wheelchair activity did not occur: Safety/medical concerns         Wheelchair 50 feet with 2 turns activity    Assist    Wheelchair 50 feet with 2 turns activity did not occur: Safety/medical concerns       Wheelchair 150 feet activity     Assist  Wheelchair 150 feet activity did not occur: Safety/medical concerns       Blood pressure 122/69, pulse 60, temperature 98.4 F (36.9 C), temperature source Oral, resp. rate 19, height 6\' 2"  (1.88 m), weight (!) 168.1 kg, SpO2 92 %.  Medical Problem List and Plan: 1.  Cardiopulmonary debility with ICU myopathy- his strength is MUCH weaker than expected for just debility- His DF's are 2/5 at most- cannot put PRAFOs on due to poor condition of skin             -patient may shower   Continue CIR- PT, OT and SLP  -Interdisciplinary Team Conference today, d/c 03/24/21 (21 days) 2.  Antithrombotics: -DVT/anticoagulation:  Eliquis bid             -antiplatelet therapy: N/A 3. Pain Management: N/A 4. Mood: LCSW to follow for evaluation and support.              -antipsychotic agents: N/A 5. Neuropsych: This patient is capable of making decisions on his own behalf. 6. Skin/Wound Care: Routine pressure relief measures.  7. Fluids/Electrolytes/Nutrition: Strict I/O. Electrolytes stable.  8. CAD/Chronic diastolic CHF: Monitor for signs of overload and check weight daily. --1500 cc FR/day. 6/21: weight down.  Filed Weights   03/08/21 0619 03/09/21 0438 03/10/21 0544  Weight: (!) 172.8 kg (!) 174.4 kg (!) 168.1 kg   9. COPD: Continue Breo and Incruse daily. Wean O2 as tolerated. Goal sats 88-92%, currently 92. Breathing well.  10. OHS/OSA:  Continue BIPAP with 2 Liters bled in PTA, currently using 3L  11. Acute on chronic renal failure:? He denies any CKD --SCr trending back to new baseline around 1.3-1.4?             --Monitor for signs of overaload. Off lasix.  6/20 Cr down to 1.27, continue to monitor especially with additional doses of Lasix PRN.  12. Stasis dermatitis: Continue local care   6/15- will add Eucerin BID and monitor- would suggest vaseline for home care to wife.  13.  Constipation: continue 1/2 dose miralax PRN 14. Urinary urgency, chronic: this is very distressing to patient, so please attend to him as soon as possible. Would benefit from briefs and q1H timed voiding while awake.     6/15- will d/c external catheter at night- the glue is distressing and doesn't want it anymore- will con't timed toileting.   15.  Bowel incont- will hold miralax, also on colchicine but only on 1 tab daily 16. Acute blood loss anemia: Hgb 12.0 on 6/21, up from 10.8, monitor weekly  LOS: 8 days A FACE TO FACE EVALUATION WAS PERFORMED  [name] Nasiah Lehenbauer 03/10/2021, 12:15 PM

## 2021-03-10 NOTE — Progress Notes (Signed)
Sleep chart continue patient slept approximately 9/12 hours,during shift,denies pain.BI-PAP applied and tolerated well. Incontinent care provided. Continue monitoring with 1500 cc fluid restriction,no acute stress

## 2021-03-10 NOTE — Progress Notes (Signed)
Physical Therapy Weekly Progress Note  Patient Details  Name: [name] MRN: [id number] Date of Birth: [birth year]  Beginning of progress report period: March 03, 2021 End of progress report period: March 10, 2021  Today's Date: 03/10/2021 PT Individual Time: [id number]; Arthor Captain: [id number] PT Individual Time Calculation (min): 55 min & 40 min  Patient has met 4 of 4 short term goals.  Patient able to achieve STG's this week with excellent progress with transfers and able to ambulate up to 20' today with PFRW and min A of 2 for chair follow.  He still needs +2 for sit to stand but min to mod A needed and able to perform bed mobility with mod A of 2 for sit to supine and only 1 A for supine to sit.    Patient continues to demonstrate the following deficits muscle weakness and decreased standing balance and decreased endurance  and therefore will continue to benefit from skilled PT intervention to increase functional independence with mobility.  Patient progressing toward long term goals..  Continue plan of care.  PT Short Term Goals Week 1:  PT Short Term Goal 1 (Week 1): Patient to perform supine to sit with mod A of 1. PT Short Term Goal 1 - Progress (Week 1): Met PT Short Term Goal 2 (Week 1): Patient to perform sit to stand from higher surface with mod A of 2 for safety x 3 reps PT Short Term Goal 2 - Progress (Week 1): Met PT Short Term Goal 3 (Week 1): Patient to perform stand step to shuttle chair with PFRW and +2 A PT Short Term Goal 3 - Progress (Week 1): Met PT Short Term Goal 4 (Week 1): Patient to perform sit to supine with mod A of 2 PT Short Term Goal 4 - Progress (Week 1): Met Week 2:  PT Short Term Goal 1 (Week 2): Patient will ambulate at least 23' with mod A of 1. PT Short Term Goal 2 (Week 2): Patient will perform sit to stand with CGA. PT Short Term Goal 3 (Week 2): Patient will mobilize in power chair with S. PT Short Term Goal 4 (Week 2): Patient will  perform sit to supine with mod A of 1  Skilled Therapeutic Interventions/Progress Updates:  Session1: Patient in shuttle chair.  Family in the room.  Performed sit to stand with +2 mod A.  Patient ambulated with chair follow 68' with bilat PFRW and min A +2 for safety.  Patient maintained on 2L O2 throughout.  Patient seated in chair to perform UBE x 5 min (1/2 fwd, 1/2 reverse).  Patient ambulated 57' with PFRW and min A with shuttle chair following.  Left in chair with call bell in reach.  Session2: Patient in shuttle chair with family leaving. Reports mild pain in knees and shoulders from walking this morning.  Patient assisted in chair to dayroom.  Seated on Kinetron at 40 cm/sec 2 x 30 reps with both LE's.  Patient sit to stand to PFRW +2 min A and ambulated 10' to bed with min A of 1-2.  Sit to supine with mod A of 2 for LE's and trunk.  Positioned for comfort and left with call bell and needs in reach and bed alarm active.   Ambulation/gait training;Balance/vestibular training;Discharge planning;DME/adaptive equipment instruction;Functional mobility training;Splinting/orthotics;Therapeutic Activities;UE/LE Strength taining/ROM;Wheelchair propulsion/positioning;UE/LE Coordination activities;Therapeutic Exercise;Patient/family education;Neuromuscular re-education;Disease management/prevention;Community reintegration   Therapy Documentation Precautions:  Precautions Precautions: Fall, Other (comment) Precaution Comments: Watch SpO2, foley catheter Required Braces or  Orthoses: Other Brace Other Brace: Has bilateral knee braces/compression wrap (for stability); left knee hinged brace then places elastic sleeve over brace; rt knee prefers ace wrap tightly for support Restrictions Weight Bearing Restrictions: No  Pain: Pain Assessment Faces Pain Scale: Hurts little more Pain Type: Chronic pain Pain Location: Generalized Pain Descriptors / Indicators: Aching Pain Onset: With Activity Pain  Intervention(s): Repositioned;Rest   Therapy/Group: Individual Therapy  Reginia Naas Magda Kiel, PT 03/10/2021, 2:55 PM

## 2021-03-10 NOTE — Patient Care Conference (Signed)
Inpatient RehabilitationTeam Conference and Plan of Care Update Date: 03/10/2021   Time: 12:14 PM    Patient Name: [name]      Medical Record Number: [id number]  Date of Birth: [birth year] Sex: Male         Room/Bed: 4W15C/4W15C-01 Payor Info: Payor: [employer] MEDICARE / Plan: [employer] MEDICARE HMO/PPO / Product Type: *No Product type* /    Admit Date/Time:  03/02/2021  4:43 PM  Primary Diagnosis:  Intensive care (ICU) myopathy  Hospital Problems: Principal Problem:   Intensive care (ICU) myopathy Active Problems:   Pressure injury of skin   Debility    Expected Discharge Date: Expected Discharge Date: 03/24/21  Team Members Present: Physician leading conference: Dr. Leeroy Cha Care Coodinator Present: [name], LCSW;Madai Nuccio Hervey Ard, RN, BSN, [place] Nurse Present: Dorien Chihuahua, RN PT Present: Magda Kiel, PT OT Present: Other (comment) Hulda Marin, OT) PPS Coordinator present : Gunnar Fusi, SLP     Current Status/Progress Goal Weekly Team Focus  Bowel/Bladder   Patient is incontinent of B/B lbm 03/09/21  Patient will regain continence of both b&b  Time toileting Q 2 hrs, assess toileting need   Swallow/Nutrition/ Hydration             ADL's   max A/dependent lower body care, wearing hospital gown only due to perference with min A, set up for oral care, suine to sit edge of bed with min a, sit to supine max A of 2, sit to stand from elevated bed surface CGA x2 - able to tolerate standing for 4 minutes, OH lift transfer to bariatric chair  mod A ADL, min A xfer  general conditioning, bed mobility, sit to stand, transfer training, adl training   Mobility   max A bed mobility, sit to stand +2 A, ambulation 6-7' fwd then back with B PFRW and +2 min A for safety.  heavy UE support for standing and gait activities.  S short distance ambulation, for w/c mobility in power chair from home, CGA standing dynamic balance.  gait, transfers, bed mobility, safety    Communication             Safety/Cognition/ Behavioral Observations            Pain   Denies pain  Patient will remain pain free  Assess Pain and disocmfort QS/PRN with follow up   Skin   Bilateral Leg vasculat stati, redness to sacral area/some drainage  Keep skin clean and dry, wounds cleaned and protected with dressings as needed  Dressing changes as ordered and continue current treatment , QS/PRN assessment address new concerns     Discharge Planning:  HOme with wife and son to assist, family hoping can reach higher goals thanmin-mod level   Team Discussion: Maintaining compliance with fluid restrictions. Requested half dose of miralax for constipation.  Patient on target to meet rehab goals: yes, currently CGA for transfers with right knee buckling. Slow steady progress noted. Able to manage 41' with platform walker but requires + 2 assist for sit - stand transfers. Requires min assist for ADLs. Little participation with ADLs as had help with this PTA and has adaptive equipment set up so he should do well. Supervision goals overall with CGA for dynamic balance.  *See Care Plan and progress notes for long and short-term goals.   Revisions to Treatment Plan:  Practice with power chair transfers used at home   Teaching Needs: Transfers, toileting, dynamic standing balance assistance, skin care, medications, etc.  Current Barriers to Discharge: Decreased caregiver support, Home enviroment access/layout, Wound care, and Weight  Possible Resolutions to Barriers: Family education     Medical Summary Current Status: obesity (BMI 47.58), ICU myopathy, CHF with increasing weight, hypoxia requring [place], AKI, OSA, right knee buckling  Barriers to Discharge: Wound care;Medical stability  Barriers to Discharge Comments: obesity (BMI 47.58), ICU myopathy, CHF with increasing weight, hypoxia requring [place], AKI, OSA, right knee buckling Possible Resolutions to [employer]: continue  oxygen supplementation as needed, Lasix PRN as needed, continue to monitor Cr, continue home Bipap, continue right knee sleeve   Continued Need for Acute Rehabilitation Level of Care: The patient requires daily medical management by a physician with specialized training in physical medicine and rehabilitation for the following reasons: Direction of a multidisciplinary physical rehabilitation program to maximize functional independence : Yes Medical management of patient stability for increased activity during participation in an intensive rehabilitation regime.: Yes Analysis of laboratory values and/or radiology reports with any subsequent need for medication adjustment and/or medical intervention. : Yes   I attest that I was present, lead the team conference, and concur with the assessment and plan of the team.   Dorien Chihuahua B 03/10/2021, 5:05 PM

## 2021-03-10 NOTE — Progress Notes (Signed)
Occupational Therapy Weekly Progress Note  Patient Details  Name: [name] MRN: [id number] Date of Birth: 11-04-1940  Beginning of progress report period: March 03, 2021 End of progress report period: March 10, 2021  Today's Date: 03/10/2021 OT Individual Time: 0900-1015 OT Individual Time Calculation (min): 75 min    Patient has met 1 of 4 short term goals.  Patient is making progress in strength and mobility in preparation for functional transfers and independence in BADLs, however, remains deconditioned with unsteadiness on feet, decreased endurance, and tolerance to sit<>stand and static standing. Limited by R knee buckling and generalized weakness. Pt also declining certain ADLs due to convenience of care and PLOF and assistance at home PTA.  Patient continues to demonstrate the following deficits: muscle weakness and muscle joint tightness, decreased cardiorespiratoy endurance and decreased oxygen support, and decreased sitting balance, decreased standing balance, decreased postural control, and decreased balance strategies and therefore will continue to benefit from skilled OT intervention to enhance overall performance with BADL, iADL, and Reduce care partner burden.  Patient progressing toward long term goals.  Continue plan of care.  OT Short Term Goals Week 2:  OT Short Term Goal 1 (Week 2): Patient will complete LB dressing with AE PRN and LRAD mod A. OT Short Term Goal 2 (Week 2): Patient will complete 1/3 toileting tasks mod A using AE PRN. OT Short Term Goal 3 (Week 2): Patient will complete sit<>stand min A consistently with +2 for safety using LRAD. OT Short Term Goal 4 (Week 2): Patient will maintain standing balance for 2 minutes using LRAD in preparation for improved functional mobility/ADLs.  Skilled Therapeutic Interventions/Progress Updates:    Pt received supine in bed, agreeable to OT, reporting no pain. Pt reporting frustration with previous night, ending up  urinating on self after using call bell multiple times to ask for help. Pt completed oral hygine set up A semifowlers in bed. Sup>sit using bed rails and increased time min-mod A; pt able to scoot BLEs to EOB without A. Pt required initial CGA to maintain sitting balance EOB but improved to spvsn. Pt donned L knee braces mod I using reacher. Total A to don R knee ace wrap. Max A to don shoes EOB (pt able to start using reacher but needs A pulling over heel and tying). From elevated bed height, sit<>stand using Bilateral platform RW min A-CGA. Pt completed ambulation with platform RW bed>bariatric recliner ~5' with increased time and CGA, completing turn with no LOB or extra A. When stepping backwards towards chair, pt R knee buckling and demonstrated difficulties stepping LLE backwards. Completed 2 standing trials with platform RW for ~2 min each with rest break in between. Attempted third sit<>stand but pt unable to maintain due to fatigue and R knee buckling. Pt expressed frustration in progress compared to walking in session yesterday, provided with emotional encouragement. Pt engaged in BUE HEP using orange level 2 theraband 15x each exercise with vc's for breathing techniques. Throughout session, pt O2 saturation remained in upper 90s with 2L O2 via [place] and HR remained in low 70s. R knee ace wrap removed. Pt remained seated in bariatric recliner, all needs met.  Therapy Documentation Precautions:  Precautions Precautions: Fall, Other (comment) Precaution Comments: Watch SpO2, foley catheter Required Braces or Orthoses: Other Brace Other Brace: Has bilateral knee braces/compression wrap (for stability); left knee hinged brace then places elastic sleeve over brace; rt knee prefers ace wrap tightly for support Restrictions Weight Bearing Restrictions: No  Pain:  Pain Assessment Pain Scale: 0-10 Pain Score: 0-No pain   Therapy/Group: Individual Therapy  Mellissa Kohut 03/10/2021, 10:03 AM

## 2021-03-11 ENCOUNTER — Telehealth: Payer: Medicare HMO

## 2021-03-11 LAB — GLUCOSE, CAPILLARY
Glucose-Capillary: 99 mg/dL (ref 70–99)
Glucose-Capillary: 104 mg/dL — ABNORMAL HIGH (ref 70–99)
Glucose-Capillary: 88 mg/dL (ref 70–99)

## 2021-03-11 NOTE — Progress Notes (Signed)
PROGRESS NOTE   Subjective/Complaints: No complaints this morning.  Working hard at ambulating with therapy.   ROS:  Pt denies SOB, abd pain, CP, N/V/D, +urinary frequency, +constipation   Objective:   No results found. Recent Labs    03/09/21 0647  WBC 5.1  HGB 12.0*  HCT 37.8*  PLT 147*   Recent Labs    03/09/21 0647  NA 141  K 4.2  CL 99  CO2 34*  GLUCOSE 93  BUN 16  CREATININE 1.27*  CALCIUM 9.3    Intake/Output Summary (Last 24 hours) at 03/11/2021 1242 Last data filed at 03/11/2021 0900 Gross per 24 hour  Intake 640 ml  Output 600 ml  Net 40 ml     Pressure Injury 03/02/21 Sacrum Mid Stage 2 -  Partial thickness loss of dermis presenting as a shallow open injury with a red, pink wound bed without slough. Small circular area in crack (Active)  03/02/21 1720  Location: Sacrum  Location Orientation: Mid  Staging: Stage 2 -  Partial thickness loss of dermis presenting as a shallow open injury with a red, pink wound bed without slough.  Wound Description (Comments): Small circular area in crack  Present on Admission: Yes    Physical Exam: Vital Signs Blood pressure 133/64, pulse 60, temperature 97.7 F (36.5 C), resp. rate 18, height 6\' 2"  (1.88 m), weight (!) 173.1 kg, SpO2 94 %. Gen: no distress, normal appearing, BMI 49 HEENT: oral mucosa pink and moist, NCAT Cardio: Reg rate Chest: normal effort, normal rate of breathing Abd: soft, non-distended Ext: no edema Psych: pleasant, normal affect  Skin: BLE with kerlex single layer feet to knees, no weeping, stasis dermatitis changes seen Neurological:    Mental Status: He is alert. 5/5 strenght in upper extremities, 3/5 in proximal LE, 2/5 LE DF    Assessment/Plan: 1. Functional deficits which require 3+ hours per day of interdisciplinary therapy in a comprehensive inpatient rehab setting. Physiatrist is providing close team supervision and  24 hour management of active medical problems listed below. Physiatrist and rehab team continue to assess barriers to discharge/monitor patient progress toward functional and medical goals  Care Tool:  Bathing    Body parts bathed by patient: Right arm, Left arm, Chest, Abdomen, Face   Body parts bathed by helper: Right lower leg, Left lower leg     Bathing assist Assist Level: Set up assist (using AE)     Upper Body Dressing/Undressing Upper body dressing Upper body dressing/undressing activity did not occur (including orthotics): N/A What is the patient wearing?: Hospital gown only    Upper body assist Assist Level: Set up assist    Lower Body Dressing/Undressing Lower body dressing    Lower body dressing activity did not occur: N/A What is the patient wearing?:  (L knee brace)     Lower body assist Assist for lower body dressing: Independent with assitive device Assistive Device Comment: reacher to don L knee brace   Toileting Toileting Toileting Activity did not occur (Clothing management and hygiene only): N/A (no void or bm)  Toileting assist Assist for toileting: Total Assistance - Patient < 25%     Transfers Chair/bed  transfer  Transfers assist  Chair/bed transfer activity did not occur: Safety/medical concerns  Chair/bed transfer assist level: Contact Guard/Touching assist     Locomotion Ambulation   Ambulation assist   Ambulation activity did not occur: Safety/medical concerns  Assist level: 2 helpers Assistive device: Walker-platform Max distance: 70'   Walk 10 feet activity   Assist  Walk 10 feet activity did not occur: Safety/medical concerns  Assist level: 2 helpers Assistive device: Walker-platform   Walk 50 feet activity   Assist Walk 50 feet with 2 turns activity did not occur: Safety/medical concerns  Assist level: 2 helpers Assistive device: Walker-platform    Walk 150 feet activity   Assist Walk 150 feet activity did  not occur: Safety/medical concerns         Walk 10 feet on uneven surface  activity   Assist Walk 10 feet on uneven surfaces activity did not occur: Safety/medical concerns         Wheelchair     Assist Will patient use wheelchair at discharge?: Yes Type of Wheelchair: Power Wheelchair activity did not occur: Safety/medical concerns         Wheelchair 50 feet with 2 turns activity    Assist    Wheelchair 50 feet with 2 turns activity did not occur: Safety/medical concerns       Wheelchair 150 feet activity     Assist  Wheelchair 150 feet activity did not occur: Safety/medical concerns       Blood pressure 133/64, pulse 60, temperature 97.7 F (36.5 C), resp. rate 18, height 6\' 2"  (1.88 m), weight (!) 173.1 kg, SpO2 94 %.  Medical Problem List and Plan: 1.  Cardiopulmonary debility with ICU myopathy- his strength is MUCH weaker than expected for just debility- His DF's are 2/5 at most- cannot put PRAFOs on due to poor condition of skin             -patient may shower   Continue CIR- PT, OT and SLP  -d/c 03/24/21 (21 days) 2.  Antithrombotics: -DVT/anticoagulation:  Eliquis bid             -antiplatelet therapy: N/A 3. Pain Management: N/A 4. Mood: LCSW to follow for evaluation and support.              -antipsychotic agents: N/A 5. Neuropsych: This patient is capable of making decisions on his own behalf. 6. Skin/Wound Care: Routine pressure relief measures.  7. Fluids/Electrolytes/Nutrition: Strict I/O. Electrolytes stable.  8. CAD/Chronic diastolic CHF: Monitor for signs of overload and check weight daily. --1500 cc FR/day. 6/22: weight stable  Filed Weights   03/09/21 0438 03/10/21 0544 03/11/21 0527  Weight: (!) 174.4 kg (!) 168.1 kg (!) 173.1 kg   9. COPD: Continue Breo and Incruse daily. Wean O2 as tolerated. Goal sats 88-92%, currently 92. Breathing well.  10. OHS/OSA: Continue BIPAP with 2 Liters bled in PTA, currently using 3L  11.  Acute on chronic renal failure:? He denies any CKD --SCr trending back to new baseline around 1.3-1.4?             --Monitor for signs of overaload. Off lasix.  6/20 Cr down to 1.27, continue to monitor especially with additional doses of Lasix PRN.  12. Stasis dermatitis: Continue local care   6/15- will add Eucerin BID and monitor- would suggest vaseline for home care to wife.  13. Constipation: continue 1/2 dose miralax PRN 14. Urinary urgency, chronic: this is very distressing to patient, so please  attend to him as soon as possible. Would benefit from briefs and q1H timed voiding while awake.    15.  Bowel incont- will hold miralax, also on colchicine but only on 1 tab daily 16. Acute blood loss anemia: Hgb 12.0 on 6/21, up from 10.8, monitor weekly  LOS: 9 days A FACE TO FACE EVALUATION WAS PERFORMED  [name] 03/11/2021, 12:42 PM

## 2021-03-11 NOTE — Progress Notes (Signed)
Physical Therapy Session Note  Patient Details  Name: [name] MRN: [id number] Date of Birth: 02-15-1941  Today's Date: 03/11/2021 PT Co-Treatment Time: 0905-0935 PT Co-Treatment Time Calculation (min): 30 min  Short Term Goals: Week 1:  PT Short Term Goal 1 (Week 2): Patient will ambulate at least 58' with mod A of 1. PT Short Term Goal 2 (Week 2): Patient will perform sit to stand with CGA. PT Short Term Goal 3 (Week 2): Patient will mobilize in power chair with S. PT Short Term Goal 4 (Week 2): Patient will perform sit to supine with mod A of 1  Skilled Therapeutic Interventions/Progress Updates:    Patient in supine and seen in co-treat session with Wells Guiles, OT present.  Patient on 2.5 L O2 at rest and after supine to sit demonstrated SpO2 mid 80's improved with cues for pursed lip breathing to 91%.  Patient performed supine to sit with mod A (+2 for safety).  Patient performed sit to stand with min A of 2 at [place].  Attempted BSC transfer, but noted seat at least 4" below the height of the shuttle chair and at it's highest level.  Discussed the fact that he could not use this particular BSC since the bowl is too small.  Patient instead ambulated x 100' with PFRW and min A +2 for chair to follow needing skilled assist for safety and to progress ambulation.  Patient assisted to room in shuttle chair and at sink for seated ADL's with new long handled brush and soap provided by family utilizing handle for applying deodorant.  Patient donned clean gown with min A and left up in chair with call bell and needs in reach.    Therapy Documentation Precautions:  Precautions Precautions: Fall, Other (comment) Precaution Comments: Watch SpO2, foley catheter Required Braces or Orthoses: Other Brace Other Brace: Has bilateral knee braces/compression wrap (for stability); left knee hinged brace then places elastic sleeve over brace; rt knee prefers ace wrap tightly for  support Restrictions Weight Bearing Restrictions: No Pain: Pain Assessment Pain Scale: 0-10 Pain Score: 0-No pain   Therapy/Group: Co-Treatment  Jamison Oka, PT 03/11/2021, 12:33 PM

## 2021-03-11 NOTE — Progress Notes (Signed)
Physical Therapy Session Note  Patient Details  Name: [name] MRN: [id number] Date of Birth: 1941-03-28  Today's Date: 03/11/2021 PT Individual Time: 1100-1200 PT Individual Time Calculation (min): 60 min   Short Term Goals: Week 2:  PT Short Term Goal 1 (Week 2): Patient will ambulate at least 24' with mod A of 1. PT Short Term Goal 2 (Week 2): Patient will perform sit to stand with CGA. PT Short Term Goal 3 (Week 2): Patient will mobilize in power chair with S. PT Short Term Goal 4 (Week 2): Patient will perform sit to supine with mod A of 1  Skilled Therapeutic Interventions/Progress Updates:    Patient up in shuttle chair and without complaints.  Assisted to dayroom in chair with portable O2 at 2LPM and performed 2 x 60 reps on Kinetron @ 45 cm/sec.  Patient sit to stand to [place] with min A and ambulated 50' with +2 for chair follow with min A.  Patient seated in chair performed ball toss, trunk flex/ext x 10 arms crossed over chest and reaching forward to retrieve and place cones for sitting balance & trunk strength.  Patient assisted in chair back to room.  Son and wife arrived with power chair and son showed photos of Lucianne Lei and how power chair fits into [place] and allows pt to lock in and dive safely.  Also showed photo of over the toilet riser seat that would fit over his commode and has an electric lift.  Patient approved as son reports he can fit it with his toilet seat and place bidet as well.  Patient left in chair with family in the room and needs met.   Therapy Documentation Precautions:  Precautions Precautions: Fall, Other (comment) Precaution Comments: Watch SpO2, foley catheter Required Braces or Orthoses: Other Brace Other Brace: Has bilateral knee braces/compression wrap (for stability); left knee hinged brace then places elastic sleeve over brace; rt knee prefers ace wrap tightly for support Restrictions Weight Bearing Restrictions: No  Pain: Pain  Assessment Pain Scale: 0-10 Pain Score: 0-No pain Pain Type: Acute pain Pain Location: Shoulder Pain Orientation: Left Pain Descriptors / Indicators: Aching;Sore Pain Onset: With Activity Pain Intervention(s): Repositioned;Emotional support;Therapeutic touch;Rest Multiple Pain Sites: No    Therapy/Group: Individual Therapy  Reginia Naas [place], [place] 03/11/2021, 5:00 PM

## 2021-03-11 NOTE — Progress Notes (Signed)
Occupational Therapy Session Note  Patient Details  Name: [name] MRN: [id number] Date of Birth: 03-01-1941  Today's Date: 03/11/2021 OT Co-Treat Time: 0935-1003  OT Co-Treat Time Calculation (min): 28 min   OT individual treat time: 1400-1503 OT individual treat time calculation (min): 63 min  Short Term Goals: Week 2:  OT Short Term Goal 1 (Week 2): Patient will complete LB dressing with AE PRN and LRAD mod A. OT Short Term Goal 2 (Week 2): Patient will complete 1/3 toileting tasks mod A using AE PRN. OT Short Term Goal 3 (Week 2): Patient will complete sit<>stand min A consistently with +2 for safety using LRAD. OT Short Term Goal 4 (Week 2): Patient will maintain standing balance for 2 minutes using LRAD in preparation for improved functional mobility/ADLs.  Skilled Therapeutic Interventions/Progress Updates:    Session 1: Pt received supine in bed, agreeable to co-treat session with PT and OT, and reporting not sleeping well previous night. Session focused on BADLs and functional transfers to improve independence in preparation for safe d/c home. Total A for time to don bilateral knee braces; max A donning shoes EOB. Sup>sit mod A with use of bed rails. Sit<>stand min A +2 using platform RW from elevated bed height. Increased time to gain standing balance and during 100' functional ambulation with chair follow, CGA, and no rest breaks (on 3L portable O2 via [place]); vc's for breathing techniques. Pt returned to room in bariatric recliner due to fatigue from ambulation and completed oral hygiene and UB bathing/dressing seated at sink with set up A. Pt demoed anterior leaning ROM limitations when reaching for task items due to shoulder pain (PTA). Pt used own AE (long handled sponge) for UB bathing and grooming tasks. Pt remained seated in recliner, all needs met, call bell in reach at end of session.  Session 2: Pt received seated in bariatric recliner, agreeable to OT, son and wife  present, reporting no pain. Session focus on transfer training, bed mobility, dynamic sitting balance, and general conditioning to promote increased independence in ADLs and mobility. Family and pt discussion of home set up in bathroom with pictures provided; toilet is 21.5" and pt is purchasing a lift toilet seat and family will bring to CIR for trial during stay. Pt taken to therapy gym in bariatric recliner to complete general and BUE conditioning activities seated. Pt on 3L portable O2 via [place] with O2 sats remaining in upper 80s to low 90s throughout activity. Pt engaged in chest press with 2 and 4 lb dowel rod 10x each; modified [place] twists with 4lb dumb bell, and lateral leans using rolling table and dycem 5x each side to promote core strengthening. Pt demoed increased pain to L shoulder following attempted overhead press with 2lb dowel rod, therefore discontinued. Pt engaged in dynamic seated reaching/balance task with graded strength clothes pins (4 levels) requiring vc's to increase anterior lean and pelvic tilt to reach targets in all directions. Pt provided with multiple rest breaks due to fatigue between activities. In room, pt placed on 2.5 L O2 via [place]. Sit<>stand with platform RW mod A +2 from elevated height. Ambulated with CGA and vc's for technique ~4' recliner>bed with increased time due to unsteadiness on feet. Sit>sup +2 mod A for management of BLEs and trunk. Scooted to [facility] max A +2. Pt remained supine in bed, alarm set, call bell in reach, and all needs met.  Therapy Documentation Precautions:  Precautions Precautions: Fall, Other (comment) Precaution Comments: Watch SpO2,  foley catheter Required Braces or Orthoses: Other Brace Other Brace: Has bilateral knee braces/compression wrap (for stability); left knee hinged brace then places elastic sleeve over brace; rt knee prefers ace wrap tightly for support Restrictions Weight Bearing Restrictions: No  Pain: Pain Assessment Pain  Scale: 0-10 Pain Score: 0-No pain   Therapy/Group: Individual Therapy  Mellissa Kohut 03/11/2021, 10:58 AM

## 2021-03-12 NOTE — Progress Notes (Signed)
Occupational Therapy Session Note  Patient Details  Name: Azaryah Oleksy MRN: [id number] Date of Birth: 10/25/1940  Today's Date: 03/12/2021 OT Individual Time: 6190-1222 OT Individual Time Calculation (min): 27 min    Short Term Goals: Week 1:  OT Short Term Goal 1 (Week 1): Patient will complete LB dressing mod A with AE PRN and LRAD. OT Short Term Goal 1 - Progress (Week 1): Not met OT Short Term Goal 2 (Week 1): Patient will complete 1/3 toileting tasks with mod A and AE PRN. OT Short Term Goal 2 - Progress (Week 1): Not met OT Short Term Goal 3 (Week 1): Patient will complete sit<>stand max A of one helper in preparation for functional transfers. OT Short Term Goal 3 - Progress (Week 1): Not met OT Short Term Goal 4 (Week 1): Patient will complete UB bathing/dressing set up A using AE PRN. OT Short Term Goal 4 - Progress (Week 1): Met Week 2:  OT Short Term Goal 1 (Week 2): Patient will complete LB dressing with AE PRN and LRAD mod A. OT Short Term Goal 2 (Week 2): Patient will complete 1/3 toileting tasks mod A using AE PRN. OT Short Term Goal 3 (Week 2): Patient will complete sit<>stand min A consistently with +2 for safety using LRAD. OT Short Term Goal 4 (Week 2): Patient will maintain standing balance for 2 minutes using LRAD in preparation for improved functional mobility/ADLs.    Skilled Therapeutic Interventions/Progress Updates:    Pt greeted at time of session semireclined in bed resting, agreeable to OT session but stating he is very fatigued from previous sessions, wanting to stay bed level given 30 minute session and fatigue. Focus of session on BUE strengthening using 2# dowel for ball hits for 3x20 with very brief rest breaks between sets for global endurance training/cardio. Upgraded to adding wrist weight to bar for total of 6# for 1 set of 15 reps FWD circles, unable to tolerate weight and dropped back to 2# for chest press. Pt resting bed level call bell in reach  all needs met. Note O2 on 3L throughout via [place] and dropped to 80% with cardio, quickly rebounded with PLB.   Therapy Documentation Precautions:  Precautions Precautions: Fall, Other (comment) Precaution Comments: Watch SpO2, foley catheter Required Braces or Orthoses: Other Brace Other Brace: Has bilateral knee braces/compression wrap (for stability); left knee hinged brace then places elastic sleeve over brace; rt knee prefers ace wrap tightly for support Restrictions Weight Bearing Restrictions: No      Therapy/Group: Individual Therapy  Viona Gilmore 03/12/2021, 12:36 PM

## 2021-03-12 NOTE — Progress Notes (Signed)
PROGRESS NOTE   Subjective/Complaints: No complaints Please give 3L at night with CPAP- as per his pulmonologist. Order edited.   ROS:  Pt denies SOB, abd pain, CP, N/V/D, +urinary frequency, +constipation   Objective:   No results found. No results for input(s): WBC, HGB, HCT, PLT in the last 72 hours.  No results for input(s): NA, K, CL, CO2, GLUCOSE, BUN, CREATININE, CALCIUM in the last 72 hours.   Intake/Output Summary (Last 24 hours) at 03/12/2021 1234 Last data filed at 03/12/2021 0700 Gross per 24 hour  Intake 480 ml  Output 450 ml  Net 30 ml     Pressure Injury 03/02/21 Sacrum Mid Stage 2 -  Partial thickness loss of dermis presenting as a shallow open injury with a red, pink wound bed without slough. Small circular area in crack (Active)  03/02/21 1720  Location: Sacrum  Location Orientation: Mid  Staging: Stage 2 -  Partial thickness loss of dermis presenting as a shallow open injury with a red, pink wound bed without slough.  Wound Description (Comments): Small circular area in crack  Present on Admission: Yes    Physical Exam: Vital Signs Blood pressure 126/69, pulse (!) 59, temperature 97.8 F (36.6 C), resp. rate 20, height 6\' 2"  (1.88 m), weight (!) 173.1 kg, SpO2 96 %. Gen: no distress, normal appearing HEENT: oral mucosa pink and moist, NCAT Cardio: Bradycardia Chest: normal effort, normal rate of breathing Abd: soft, non-distended Ext: no edema Psych: pleasant, normal affect  Skin: BLE with kerlex single layer feet to knees, no weeping, stasis dermatitis changes seen Neurological:    Mental Status: He is alert. 5/5 strenght in upper extremities, 3/5 in proximal LE, 2/5 LE DF    Assessment/Plan: 1. Functional deficits which require 3+ hours per day of interdisciplinary therapy in a comprehensive inpatient rehab setting. Physiatrist is providing close team supervision and 24 hour management  of active medical problems listed below. Physiatrist and rehab team continue to assess barriers to discharge/monitor patient progress toward functional and medical goals  Care Tool:  Bathing    Body parts bathed by patient: Right arm, Left arm, Chest, Abdomen, Face   Body parts bathed by helper: Right lower leg, Left lower leg     Bathing assist Assist Level: Set up assist (using AE)     Upper Body Dressing/Undressing Upper body dressing Upper body dressing/undressing activity did not occur (including orthotics): N/A What is the patient wearing?: Hospital gown only    Upper body assist Assist Level: Set up assist    Lower Body Dressing/Undressing Lower body dressing    Lower body dressing activity did not occur: N/A What is the patient wearing?:  (L knee brace)     Lower body assist Assist for lower body dressing: Independent with assitive device Assistive Device Comment: reacher to don L knee brace   Toileting Toileting Toileting Activity did not occur (Clothing management and hygiene only): N/A (no void or bm)  Toileting assist Assist for toileting: Total Assistance - Patient < 25%     Transfers Chair/bed transfer  Transfers assist  Chair/bed transfer activity did not occur: Safety/medical concerns  Chair/bed transfer assist level: Contact Guard/Touching  assist     Locomotion Ambulation   Ambulation assist   Ambulation activity did not occur: Safety/medical concerns  Assist level: 2 helpers Assistive device: Walker-platform Max distance: 70'   Walk 10 feet activity   Assist  Walk 10 feet activity did not occur: Safety/medical concerns  Assist level: 2 helpers Assistive device: Walker-platform   Walk 50 feet activity   Assist Walk 50 feet with 2 turns activity did not occur: Safety/medical concerns  Assist level: 2 helpers Assistive device: Walker-platform    Walk 150 feet activity   Assist Walk 150 feet activity did not occur:  Safety/medical concerns         Walk 10 feet on uneven surface  activity   Assist Walk 10 feet on uneven surfaces activity did not occur: Safety/medical concerns         Wheelchair     Assist Will patient use wheelchair at discharge?: Yes Type of Wheelchair: Power Wheelchair activity did not occur: Safety/medical concerns         Wheelchair 50 feet with 2 turns activity    Assist    Wheelchair 50 feet with 2 turns activity did not occur: Safety/medical concerns       Wheelchair 150 feet activity     Assist  Wheelchair 150 feet activity did not occur: Safety/medical concerns       Blood pressure 126/69, pulse (!) 59, temperature 97.8 F (36.6 C), resp. rate 20, height 6\' 2"  (1.88 m), weight (!) 173.1 kg, SpO2 96 %.  Medical Problem List and Plan: 1.  Cardiopulmonary debility with ICU myopathy- his strength is MUCH weaker than expected for just debility- His DF's are 2/5 at most- cannot put PRAFOs on due to poor condition of skin             -patient may shower   Continue CIR- PT, OT and SLP  -d/c 03/24/21 (21 days) 2.  Antithrombotics: -DVT/anticoagulation:  Eliquis bid             -antiplatelet therapy: N/A 3. Pain Management: N/A 4. Mood: LCSW to follow for evaluation and support.              -antipsychotic agents: N/A 5. Neuropsych: This patient is capable of making decisions on his own behalf. 6. Skin/Wound Care: Routine pressure relief measures.  7. Fluids/Electrolytes/Nutrition: Strict I/O. Electrolytes stable.  8. CAD/Chronic diastolic CHF: Monitor for signs of overload and check weight daily. --1500 cc FR/day. 6/23: weight stable, continue to monitor  Filed Weights   03/09/21 0438 03/10/21 0544 03/11/21 0527  Weight: (!) 174.4 kg (!) 168.1 kg (!) 173.1 kg   9. COPD: Continue Breo and Incruse daily. Wean O2 as tolerated. Goal sats 88-92%, currently 92. Breathing well.  10. OHS/OSA: Continue BIPAP with 2 Liters bled in PTA, currently  using 3L  11. Acute on chronic renal failure:? He denies any CKD --SCr trending back to new baseline around 1.3-1.4?             --Monitor for signs of overaload. Off lasix.  6/20 Cr down to 1.27, continue to monitor especially with additional doses of Lasix PRN.  12. Stasis dermatitis: Continue local care   Continue Eucerin BID and monitor- would suggest vaseline for home care to wife.  13. Constipation: continue 1/2 dose miralax PRN 14. Urinary urgency, chronic: this is very distressing to patient, so please attend to him as soon as possible. Would benefit from briefs and q1H timed voiding while awake.  15.  Bowel incont- will hold miralax, also on colchicine but only on 1 tab daily 16. Acute blood loss anemia: Hgb 12.0 on 6/21, up from 10.8, monitor weekly  LOS: 10 days A FACE TO FACE EVALUATION WAS PERFORMED  [name] 03/12/2021, 12:34 PM

## 2021-03-12 NOTE — Progress Notes (Signed)
Physical Therapy Session Note  Patient Details  Name: [name] MRN: [id number] Date of Birth: 1941-02-05  Today's Date: 03/12/2021 PT Individual Time: 8403-7543 PT Individual Time Calculation (min): 54 min   Short Term Goals: Week 2:  PT Short Term Goal 1 (Week 2): Patient will ambulate at least 9' with mod A of 1. PT Short Term Goal 2 (Week 2): Patient will perform sit to stand with CGA. PT Short Term Goal 3 (Week 2): Patient will mobilize in power chair with S. PT Short Term Goal 4 (Week 2): Patient will perform sit to supine with mod A of 1  Skilled Therapeutic Interventions/Progress Updates:    Patient up in shuttle chair, performed sit to stand to [place] with mod A and ambulated 8' to power w/c stand to sit with CGA.  Patient positioned with feet on foot pedal and propelled w/c over 300' over uneven surface and around furniture in waiting area all independently with good safety awareness to adjust speed as needed.  Maintained on 3L O2 throughout session.  Patient in room attempted sit to stand to [place] several times with +2 A without success despite pt positioning hands differently and support at R knee as attempting to stand.  Patient propelled chair under sky lift and lifted to sitting EOB using maxisky.  Attempted sit to stand from elevated surface at EOB without success.  Assisted sit to supine with +2 max A.  Patient scooted to [facility] with +2 min A after bed in trendelenberg and pushing with feet on bed and R UE pulling on headboard.  Patient positioned for comfort and left with call bell in reach and bed alarm active.  Therapy Documentation Precautions:  Precautions Precautions: Fall, Other (comment) Precaution Comments: Watch SpO2, foley catheter Required Braces or Orthoses: Other Brace Other Brace: Has bilateral knee braces/compression wrap (for stability); left knee hinged brace then places elastic sleeve over brace; rt knee prefers ace wrap tightly for  support Restrictions Weight Bearing Restrictions: No  Pain: Pain Assessment Pain Score: 0-No pain    Therapy/Group: Individual Therapy  Reginia Naas Magda Kiel, PT 03/12/2021, 5:53 PM

## 2021-03-12 NOTE — Progress Notes (Signed)
Resting in no acute distress or discomfort, denies pain coloration adequate, respiration unlabored wears BIPAP - 2 L, Oxygen sats within normal ranges.Continue wound care to BLE;s, strict I/O,and 1500 cc FR , monitor and assisted.[name]

## 2021-03-12 NOTE — Progress Notes (Addendum)
Occupational Therapy Session Note  Patient Details  Name: [name] MRN: [id number] Date of Birth: [birth year]  Today's Date: 03/12/2021 OT Individual Time: 1040-1200 and 1310-1350 OT Individual Time Calculation (min): 80 min and 40 min   Short Term Goals: Week 2:  OT Short Term Goal 1 (Week 2): Patient will complete LB dressing with AE PRN and LRAD mod A. OT Short Term Goal 2 (Week 2): Patient will complete 1/3 toileting tasks mod A using AE PRN. OT Short Term Goal 3 (Week 2): Patient will complete sit<>stand min A consistently with +2 for safety using LRAD. OT Short Term Goal 4 (Week 2): Patient will maintain standing balance for 2 minutes using LRAD in preparation for improved functional mobility/ADLs.  Skilled Therapeutic Interventions/Progress Updates:    Session 1: Pt received supine in bed with wife and son leaving, agreeable to OT, reporting no pain. Session focused on BADLs, functional mobility and endurance, dynamic sitting balance and core strengthening. Pt reported need for urination, but was incontinent of bladder. Set up A for perihygiene, max-total A to change brief rolling in bed. Sup>sit mod A using bed rails. Donned shoes max A and B knee braces dependent for time. Sit>stand from elevated height mod A for boost up using platform RW; stand>sit CGA. Pt requesting functional ambulation; ambulated with platform RW at [facility] with bariatric recliner follow 106'. Pt did not demo SOB or fatigue until end of functional ambulation trial requiring rest break for fatigue. Vitals remained stable throughout session on 3L portable O2 via [place]. Pt engaged in seated dynamic balance, core strengthening, and reaching outside BOS activity ~10 min with back unsupported; requiring min A to reach targets with LUE due to limited ROM. Pt used reacher to gather items from floor with no vc's. Pt returned to room in recliner, call bell in reach, all needs met, set up for lunch.  Session 2: Pt received  seated in bariatric recliner, agreeable to OT, reporting no pain. Session focused on therapeutic activities to increase endurance, sitting balance, BUE conditioning, and standing tolerance to improve independence in functional mobility and BADLs. Pt taken to day room and completed 3 min on UE ergometer without change in vitals. Rest break provided due to fatigue. Pt engaged in dynamic sitting and reaching table top task requiring anterior leaning/weight shift with no vc's or A. Pt engaged in 1 sit<>stand from elevated height requiring mod A for boost up without use of AD, but using table top for support and BUE power up. Pt stood with BUE supported on table for ~1 min before requiring sitting. Pt required rest break but no change in vitals. Pt returned to room and remained seated in recliner, call bell in hand, all needs met, ready for impending PT session.   Therapy Documentation Precautions:  Precautions Precautions: Fall, Other (comment) Precaution Comments: Watch SpO2, foley catheter Required Braces or Orthoses: Other Brace Other Brace: Has bilateral knee braces/compression wrap (for stability); left knee hinged brace then places elastic sleeve over brace; rt knee prefers ace wrap tightly for support Restrictions Weight Bearing Restrictions: No  Pain: Pain Assessment Pain Scale: 0-10 Pain Score: 0-No pain   Therapy/Group: Individual Therapy  Mellissa Kohut 03/12/2021, 12:44 PM

## 2021-03-13 NOTE — Progress Notes (Signed)
PROGRESS NOTE   Subjective/Complaints: Discussed with him this morning that [facility] has denied further inpatient CIR. Yesterday his wife and him had felt comfortable with an earlier discharge date, but he is worried he was unable to get out of his wheelchair himself yesterday  ROS:  Pt denies SOB, abd pain, CP, N/V/D, +urinary frequency, +constipation, +lower extremity weakness   Objective:   No results found. No results for input(s): WBC, HGB, HCT, PLT in the last 72 hours.  No results for input(s): NA, K, CL, CO2, GLUCOSE, BUN, CREATININE, CALCIUM in the last 72 hours.   Intake/Output Summary (Last 24 hours) at 03/13/2021 1005 Last data filed at 03/13/2021 0700 Gross per 24 hour  Intake 720 ml  Output 125 ml  Net 595 ml     Pressure Injury 03/02/21 Sacrum Mid Stage 2 -  Partial thickness loss of dermis presenting as a shallow open injury with a red, pink wound bed without slough. Small circular area in crack (Active)  03/02/21 1720  Location: Sacrum  Location Orientation: Mid  Staging: Stage 2 -  Partial thickness loss of dermis presenting as a shallow open injury with a red, pink wound bed without slough.  Wound Description (Comments): Small circular area in crack  Present on Admission: Yes    Physical Exam: Vital Signs Blood pressure 121/62, pulse 66, temperature 98.2 F (36.8 C), temperature source Oral, resp. rate 15, height 6\' 2"  (1.88 m), weight (!) 173.1 kg, SpO2 94 %. Gen: no distress, normal appearing HEENT: oral mucosa pink and moist, NCAT Cardio: Reg rate Chest: normal effort, normal rate of breathing Abd: soft, non-distended Ext: no edema Psych: pleasant, normal affec  Skin: BLE with kerlex single layer feet to knees, no weeping, stasis dermatitis changes seen Neurological:    Mental Status: He is alert. 5/5 strenght in upper extremities, 3/5 in proximal LE, 2/5 LE DF     Assessment/Plan: 1. Functional deficits which require 3+ hours per day of interdisciplinary therapy in a comprehensive inpatient rehab setting. Physiatrist is providing close team supervision and 24 hour management of active medical problems listed below. Physiatrist and rehab team continue to assess barriers to discharge/monitor patient progress toward functional and medical goals  Care Tool:  Bathing    Body parts bathed by patient: Right arm, Left arm, Chest, Abdomen, Face   Body parts bathed by helper: Right lower leg, Left lower leg     Bathing assist Assist Level: Set up assist (using AE)     Upper Body Dressing/Undressing Upper body dressing Upper body dressing/undressing activity did not occur (including orthotics): N/A What is the patient wearing?: Hospital gown only    Upper body assist Assist Level: Set up assist    Lower Body Dressing/Undressing Lower body dressing    Lower body dressing activity did not occur: N/A What is the patient wearing?:  (L knee brace)     Lower body assist Assist for lower body dressing: Independent with assitive device Assistive Device Comment: reacher to don L knee brace   Toileting Toileting Toileting Activity did not occur (Clothing management and hygiene only): N/A (no void or bm)  Toileting assist Assist for toileting: Total  Assistance - Patient < 25%     Transfers Chair/bed transfer  Transfers assist  Chair/bed transfer activity did not occur: Safety/medical concerns  Chair/bed transfer assist level: Contact Guard/Touching assist     Locomotion Ambulation   Ambulation assist   Ambulation activity did not occur: Safety/medical concerns  Assist level: Minimal Assistance - Patient > 75% Assistive device: Walker-platform Max distance: 8'   Walk 10 feet activity   Assist  Walk 10 feet activity did not occur: Safety/medical concerns  Assist level: 2 helpers Assistive device: Walker-platform   Walk 50 feet  activity   Assist Walk 50 feet with 2 turns activity did not occur: Safety/medical concerns  Assist level: 2 helpers Assistive device: Walker-platform    Walk 150 feet activity   Assist Walk 150 feet activity did not occur: Safety/medical concerns         Walk 10 feet on uneven surface  activity   Assist Walk 10 feet on uneven surfaces activity did not occur: Safety/medical concerns         Wheelchair     Assist Will patient use wheelchair at discharge?: Yes Type of Wheelchair: Manual Wheelchair activity did not occur: Safety/medical concerns  Wheelchair assist level: Independent (over ramp, around furniture and tight spaces) Max wheelchair distance: 300'    Wheelchair 50 feet with 2 turns activity    Assist    Wheelchair 50 feet with 2 turns activity did not occur: Safety/medical concerns   Assist Level: Independent   Wheelchair 150 feet activity     Assist  Wheelchair 150 feet activity did not occur: Safety/medical concerns   Assist Level: Independent   Blood pressure 121/62, pulse 66, temperature 98.2 F (36.8 C), temperature source Oral, resp. rate 15, height 6\' 2"  (1.88 m), weight (!) 173.1 kg, SpO2 94 %.  Medical Problem List and Plan: 1.  Cardiopulmonary debility with ICU myopathy- his strength is MUCH weaker than expected for just debility- His DF's are 2/5 at most- cannot put PRAFOs on due to poor condition of skin             -patient may shower   Continue CIR- PT, OT and SLP, will do peer-to-peer today to request stay until Wednesday 6/29 when his commode seat arrives, so he can raise himself off a higher commode.   -d/c 03/24/21 (21 days) 2.  Antithrombotics: -DVT/anticoagulation:  Eliquis bid             -antiplatelet therapy: N/A 3. Pain Management: N/A 4. Mood: LCSW to follow for evaluation and support.              -antipsychotic agents: N/A 5. Neuropsych: This patient is capable of making decisions on his own behalf. 6.  Skin/Wound Care: Routine pressure relief measures.  7. Fluids/Electrolytes/Nutrition: Strict I/O. Electrolytes stable.  8. CAD/Chronic diastolic CHF: Monitor for signs of overload and check weight daily. --1500 cc FR/day. 6/23: weight stable, continue to monitor  Filed Weights   03/09/21 0438 03/10/21 0544 03/11/21 0527  Weight: (!) 174.4 kg (!) 168.1 kg (!) 173.1 kg   9. COPD: Continue Breo and Incruse daily. Wean O2 as tolerated. Goal sats 88-92%, currently 92. Breathing well.  10. OHS/OSA: Continue BIPAP with 2 Liters bled in PTA, currently using 3L  11. Acute on chronic renal failure:? He denies any CKD --SCr trending back to new baseline around 1.3-1.4?             --Monitor for signs of overaload. Off lasix.  6/20 Cr down to 1.27, continue to monitor especially with additional doses of Lasix PRN.  12. Stasis dermatitis: Continue local care   Continue Eucerin BID and monitor- would suggest vaseline for home care to wife.  13. Constipation: continue 1/2 dose miralax PRN 14. Urinary urgency, chronic: this is very distressing to patient, so please attend to him as soon as possible. Would benefit from briefs and q1H timed voiding while awake.    15.  Bowel incont- will hold miralax, also on colchicine but only on 1 tab daily 16. Acute blood loss anemia: Hgb 12.0 on 6/21, up from 10.8, monitor weekly  LOS: 11 days A FACE TO FACE EVALUATION WAS PERFORMED  Markan Cazarez P Lijah Bourque 03/13/2021, 10:05 AM

## 2021-03-13 NOTE — Progress Notes (Signed)
Physical Therapy Session Note  Patient Details  Name: [name] MRN: [id number] Date of Birth: 09-Mar-1941  Today's Date: 03/13/2021 PT Individual Time: 1302-1400 PT Individual Time Calculation (min): 58 min   Short Term Goals:  Week 2:  PT Short Term Goal 1 (Week 2): Patient will ambulate at least 81' with mod A of 1. PT Short Term Goal 2 (Week 2): Patient will perform sit to stand with CGA. PT Short Term Goal 3 (Week 2): Patient will mobilize in power chair with S. PT Short Term Goal 4 (Week 2): Patient will perform sit to supine with mod A of 1   Skilled Therapeutic Interventions/Progress Updates:   Pt received sitting in recliner and agreeable to PT. Pt performed gait training with RW x 260f with min assist and follow of RW for safety min assist throughout. No knee instability noted, but did require multiple standing rest breaks. Additional gait training x [id number] min assist and PFRW. Pt performed blocked practice sit<>stand from elevated recliner with min-mod assist and increased time x 4 and +2 to initiate gait training. SpO2 noted to decrease to 86% on 2LO2 following second bout of gait training. Increased to >90% after 30sec of rest. Patient returned to room and left sitting in recliner with call bell in reach and all needs met.      Therapy Documentation Precautions:  Precautions Precautions: Fall, Other (comment) Precaution Comments: Watch SpO2, foley catheter Required Braces or Orthoses: Other Brace Other Brace: Has bilateral knee braces/compression wrap (for stability); left knee hinged brace then places elastic sleeve over brace; rt knee prefers ace wrap tightly for support Restrictions Weight Bearing Restrictions: No  Vital Signs: Therapy Vitals Temp: 98.3 F (36.8 C) Temp Source: Oral Pulse Rate: 63 Resp: 18 BP: 123/65 Patient Position (if appropriate): Lying Oxygen Therapy SpO2: 94 % O2 Device: Nasal Cannula O2 Flow Rate (L/min): 2  L/min Pain: Pain Assessment Pain Scale: 0-10 Pain Score: 0-No pain     Therapy/Group: Individual Therapy  [name]/24/2022, 6:18 PM

## 2021-03-13 NOTE — Progress Notes (Signed)
Occupational Therapy Session Note  Patient Details  Name: [name] MRN: [id number] Date of Birth: [birth year]  Today's Date: 03/13/2021 OT Individual Time:  -       Short Term Goals: Week 1:  OT Short Term Goal 1 (Week 1): Patient will complete LB dressing mod A with AE PRN and LRAD. OT Short Term Goal 1 - Progress (Week 1): Not met OT Short Term Goal 2 (Week 1): Patient will complete 1/3 toileting tasks with mod A and AE PRN. OT Short Term Goal 2 - Progress (Week 1): Not met OT Short Term Goal 3 (Week 1): Patient will complete sit<>stand max A of one helper in preparation for functional transfers. OT Short Term Goal 3 - Progress (Week 1): Not met OT Short Term Goal 4 (Week 1): Patient will complete UB bathing/dressing set up A using AE PRN. OT Short Term Goal 4 - Progress (Week 1): Met  Skilled Therapeutic Interventions/Progress Updates:    Pt participated in rhythmic drumming group. Pain in B shoulders from soreness from movemnet with rest breaks required Focus of group on BUE coordination, strengthening, endurance, timing/control, activity tolerance, and social participation and engagement. Pt performs session from seated position for energy conservation. Skilled interventions included grading movements down with shoulder soreness and advicating for pt to take self initiated rest breaks. Pt able to keep O2 sats >90% with activity dropping to high 80s 2x but bouncing back qith rest within 15 sec on 2L via [place]. Warm up performed prior to exercises and UB stretching completed at end of group with demo from OT. Pt able to select preferred song to share with group. Returned pt to room at end of session. Exited session with pt seated in recliner, exit alarm on and call light in reach  Therapy Documentation Precautions:  Precautions Precautions: Fall, Other (comment) Precaution Comments: Watch SpO2, foley catheter Required Braces or Orthoses: Other Brace Other Brace: Has bilateral  knee braces/compression wrap (for stability); left knee hinged brace then places elastic sleeve over brace; rt knee prefers ace wrap tightly for support Restrictions Weight Bearing Restrictions: No General:   Vital Signs: Therapy Vitals Temp: 98 F (36.7 C) Temp Source: Oral Pulse Rate: 60 Resp: 20 BP: 127/63 Patient Position (if appropriate): Lying Oxygen Therapy SpO2: 97 % O2 Device: Room Air Pain:   ADL: ADL Equipment Provided:  (Pt owns and regularly uses reacher, long-handled sponges) Eating: Set up Where Assessed-Eating: Bed level Grooming: Setup Where Assessed-Grooming: Edge of bed Upper Body Bathing: Minimal assistance Where Assessed-Upper Body Bathing: Edge of bed Lower Body Bathing: Moderate assistance Where Assessed-Lower Body Bathing: Edge of bed Upper Body Dressing: Setup Where Assessed-Upper Body Dressing: Edge of bed Lower Body Dressing: Maximal assistance Where Assessed-Lower Body Dressing: Edge of bed, Other (Comment) (with platform walker in stance) Toileting: Not assessed (Pt using foley catheter at eval) Toilet Transfer: Not assessed Tub/Shower Transfer: Not assessed Social research officer, government: Not assessed ADL Comments: Pt demoing difficulties with sit<>stand using platform walker; transfers discontinued at eval due to BLE weakness and unsteadiness on feet. Pt does not want to take showers as he sponge bathes at home on toilet. Uses a lot of AE/problem solving at home to complete ADLs as independently as possible, but still receives help for pulling up pants in standing and donning footwear. Vision   Perception    Praxis   Exercises:   Other Treatments:     Therapy/Group: Group Therapy  Tonny Branch 03/13/2021, 6:53 AM

## 2021-03-13 NOTE — Progress Notes (Addendum)
Patient ID: [name], male   DOB: 10-Jul-1941, 80 y.o.   MRN: [id number] Spoke with insurance case manage who reports their medical director feels can be DC 6/25. Have asked MD to do a peer to peer by noon today. Dr. Eloise Levels [phone]. MD to call and discuss longer discharge than tomorrow. Await MD call.

## 2021-03-13 NOTE — Progress Notes (Signed)
Occupational Therapy Session Note  Patient Details  Name: [name] MRN: [id number] Date of Birth: 05/11/1941  Today's Date: 03/13/2021 OT Individual Time: 1003-1100 and 6834-1962 OT Individual Time Calculation (min): 57 min and 25 min    Short Term Goals: Week 2:  OT Short Term Goal 1 (Week 2): Patient will complete LB dressing with AE PRN and LRAD mod A. OT Short Term Goal 2 (Week 2): Patient will complete 1/3 toileting tasks mod A using AE PRN. OT Short Term Goal 3 (Week 2): Patient will complete sit<>stand min A consistently with +2 for safety using LRAD. OT Short Term Goal 4 (Week 2): Patient will maintain standing balance for 2 minutes using LRAD in preparation for improved functional mobility/ADLs.  Skilled Therapeutic Interventions/Progress Updates:    Session 1: Pt received supine in bed, agreeable to OT, reporting no pain. Pt discussion of POC and d/c plan as he reported the MD said he may d/c Monday or Wednesday next week instead of 03/24/21. Pt discussion of progress made, but feels he is not ready and strong enough to return home yet. Pt discussion and education on DME and home set up options for safety in toileting, hygiene, and energy conservation strategies. Sup>sit mod-max A with HOB flat and use of bed rails. Donned shoes max A; bilateral knee braces total A for time. Sit>stand from elevated bed height with platform RW min A +2 for safety. Pt ambulated with platform RW ~5' bed>bariatric recliner and sat with CGA in controlled, slow manner. Pt completed oral hygiene and grooming tasks at sink set up A. Pt O2 removed during grooming tasks with O2 sats remaining above 86%; rest breaks required due to fatigue and min vc's for breathing techniques. UB dressing (gown) set up A-min A to tie in back. Pt taken to day room in recliner on portable 2L O2 via [place] with group therapy session imminent, all needs met.   Session 2: Pt received seated in bariatric recliner, agreeable to OT,  reporting fatigue from earlier PT session as he walked over 300'. Pt on 2.5 L O2 via [place] throughout session with no significant change in vitals during activity. Pt provided with emotional encouragement to see progress in POC. Session focus on functional transfers and bed mobility. Pt attempted sit>stand with platform RW; requiring 3 attempts and increased elevation in recliner to obtain stand max A +2 to boost up likely due to fatigue. Pt ambulated with platform RW ~5' with increased time and CGA, completing 270 degrees turn and lateral steps to [facility] bed. Pt demoed hard sit due to BLE fatigue. Dependent to doff shoes and B knee braces due to fatigue. Sit>sup mod A+2 for trunk and BLE management. Scooted to [facility] with pt assisting using bed rails max A +2. Placed towels in brief due to frequent urinary incontinence reported. Discussion with pt regarding POC and possible early d/c home; need for family education. Pt remained supine, call bell in reach, all needs met.  Therapy Documentation Precautions:  Precautions Precautions: Fall, Other (comment) Precaution Comments: Watch SpO2, foley catheter Required Braces or Orthoses: Other Brace Other Brace: Has bilateral knee braces/compression wrap (for stability); left knee hinged brace then places elastic sleeve over brace; rt knee prefers ace wrap tightly for support Restrictions Weight Bearing Restrictions: No  Vital Signs: Vitals remained stable during both sessions - see above for details - on room air and on 2-2.5 L O2 via [place].  Pain: Pain Assessment Pain Scale: 0-10 Pain Score: 0-No pain  Therapy/Group: Individual Therapy  Mellissa Kohut 03/13/2021, 4:43 PM

## 2021-03-14 NOTE — Progress Notes (Addendum)
Occupational Therapy Session Note  Patient Details  Name: [name] MRN: [id number] Date of Birth: 27-Mar-1941  Today's Date: 03/14/2021 OT Individual Time: 1415-1511 OT Individual Time Calculation (min): 56 min    Short Term Goals: Week 2:  OT Short Term Goal 1 (Week 2): Patient will complete LB dressing with AE PRN and LRAD mod A. OT Short Term Goal 2 (Week 2): Patient will complete 1/3 toileting tasks mod A using AE PRN. OT Short Term Goal 3 (Week 2): Patient will complete sit<>stand min A consistently with +2 for safety using LRAD. OT Short Term Goal 4 (Week 2): Patient will maintain standing balance for 2 minutes using LRAD in preparation for improved functional mobility/ADLs.    Skilled Therapeutic Interventions/Progress Updates:    Pt greeted at time of session supine in bed resting with family present in room. No pain, wanting to work on standing and mobility. However educated pt that since no 2+ available did not feel comfortable ambulating. Pt states he had been in bed all day and wanting to sit/stand. Donned L knee braces and R ace wrap. Supine > sit Min A for assist with trunk elevation. Donned shoes for pt d/t time limitations. Sitting EOB O2 lowered from 2.5 to 2L per home amount, monitored O2 sats throughout session and did drop to low 80s with exertion but quickly rebounded with PLB and cues. Sit <> stand initially Min A for first round and static standing approx 2 minutes. Able to lateral weight shift in standing but unable to march in place, however states he was able to walk approx 300 feet yesterday. Pt repeatedly asking for higher bed surface throughout sit <> stands for ease of standing, but educated that we cannot go too high. Pt trialing several sit > stands from elevated bed surface but unable to fully come up to standing despite cues for form and body mechanics. When transitioning partial stand > sit pt did not get buttocks back far enough and therapist providing R  knee block while 3 assist entered room and helped with sit > supine d/t pt close to edge. Scoot up in bed in trendelenburg with multiple persons to assist. Alarm on call bell in reach.    Therapy Documentation Precautions:  Precautions Precautions: Fall, Other (comment) Precaution Comments: Watch SpO2, foley catheter Required Braces or Orthoses: Other Brace Other Brace: Has bilateral knee braces/compression wrap (for stability); left knee hinged brace then places elastic sleeve over brace; rt knee prefers ace wrap tightly for support Restrictions Weight Bearing Restrictions: No      Therapy/Group: Individual Therapy  Viona Gilmore 03/14/2021, 3:13 PM

## 2021-03-14 NOTE — Progress Notes (Signed)
PROGRESS NOTE   Very pleasant, very proud of his ambulation distance yesterday! Denies SOB C/o that at night RT put him on 5L- should be only 3  ROS:  Pt denies SOB, abd pain, CP, N/V/D, +urinary frequency, +constipation, +lower extremity weakness   Objective:   No results found. No results for input(s): WBC, HGB, HCT, PLT in the last 72 hours.  No results for input(s): NA, K, CL, CO2, GLUCOSE, BUN, CREATININE, CALCIUM in the last 72 hours.   Intake/Output Summary (Last 24 hours) at 03/14/2021 1237 Last data filed at 03/14/2021 1026 Gross per 24 hour  Intake 500 ml  Output 350 ml  Net 150 ml     Pressure Injury 03/02/21 Sacrum Mid Stage 2 -  Partial thickness loss of dermis presenting as a shallow open injury with a red, pink wound bed without slough. Small circular area in crack (Active)  03/02/21 1720  Location: Sacrum  Location Orientation: Mid  Staging: Stage 2 -  Partial thickness loss of dermis presenting as a shallow open injury with a red, pink wound bed without slough.  Wound Description (Comments): Small circular area in crack  Present on Admission: Yes    Physical Exam: Vital Signs Blood pressure 128/61, pulse 70, temperature 98 F (36.7 C), temperature source Oral, resp. rate 16, height 6\' 2"  (1.88 m), weight (!) 171.4 kg, SpO2 90 %. Gen: no distress, normal appearing HEENT: oral mucosa pink and moist, NCAT Cardio: Reg rate Chest: normal effort, normal rate of breathing, [place] in place at rest.  Abd: soft, non-distended Ext: no edema Psych: pleasant, normal affect  Skin: BLE with kerlex single layer feet to knees, no weeping, stasis dermatitis changes seen Neurological:    Mental Status: He is alert. 5/5 strenght in upper extremities, 3/5 in proximal LE, 2/5 LE DF    Assessment/Plan: 1. Functional deficits which require 3+ hours per day of interdisciplinary therapy in a comprehensive inpatient rehab  setting. Physiatrist is providing close team supervision and 24 hour management of active medical problems listed below. Physiatrist and rehab team continue to assess barriers to discharge/monitor patient progress toward functional and medical goals  Care Tool:  Bathing    Body parts bathed by patient: Right arm, Left arm, Chest, Abdomen, Face   Body parts bathed by helper: Right lower leg, Left lower leg     Bathing assist Assist Level: Set up assist (using AE)     Upper Body Dressing/Undressing Upper body dressing Upper body dressing/undressing activity did not occur (including orthotics): N/A What is the patient wearing?: Hospital gown only    Upper body assist Assist Level: Set up assist    Lower Body Dressing/Undressing Lower body dressing    Lower body dressing activity did not occur: N/A What is the patient wearing?:  (L knee brace)     Lower body assist Assist for lower body dressing: Independent with assitive device Assistive Device Comment: reacher to don L knee brace   Toileting Toileting Toileting Activity did not occur (Clothing management and hygiene only): N/A (no void or bm)  Toileting assist Assist for toileting: Total Assistance - Patient < 25%     Transfers Chair/bed transfer  Transfers assist  Chair/bed transfer activity did not occur: Safety/medical concerns  Chair/bed transfer assist level: Contact Guard/Touching assist     Locomotion Ambulation   Ambulation assist   Ambulation activity did not occur: Safety/medical concerns  Assist level: Minimal Assistance - Patient > 75% Assistive device: Walker-platform Max distance: 8'   Walk 10 feet activity   Assist  Walk 10 feet activity did not occur: Safety/medical concerns  Assist level: 2 helpers Assistive device: Walker-platform   Walk 50 feet activity   Assist Walk 50 feet with 2 turns activity did not occur: Safety/medical concerns  Assist level: 2 helpers Assistive device:  Walker-platform    Walk 150 feet activity   Assist Walk 150 feet activity did not occur: Safety/medical concerns         Walk 10 feet on uneven surface  activity   Assist Walk 10 feet on uneven surfaces activity did not occur: Safety/medical concerns         Wheelchair     Assist Will patient use wheelchair at discharge?: Yes Type of Wheelchair: Manual Wheelchair activity did not occur: Safety/medical concerns  Wheelchair assist level: Independent (over ramp, around furniture and tight spaces) Max wheelchair distance: 300'    Wheelchair 50 feet with 2 turns activity    Assist    Wheelchair 50 feet with 2 turns activity did not occur: Safety/medical concerns   Assist Level: Independent   Wheelchair 150 feet activity     Assist  Wheelchair 150 feet activity did not occur: Safety/medical concerns   Assist Level: Independent   Blood pressure 128/61, pulse 70, temperature 98 F (36.7 C), temperature source Oral, resp. rate 16, height 6\' 2"  (1.88 m), weight (!) 171.4 kg, SpO2 90 %.  Medical Problem List and Plan: 1.  Cardiopulmonary debility with ICU myopathy- his strength is MUCH weaker than expected for just debility- His DF's are 2/5 at most- cannot put PRAFOs on due to poor condition of skin             -patient may shower   Continue CIR- PT, OT and SLP, will do peer-to-peer to request stay until Wednesday 6/29 when his commode seat arrives, so he can raise himself off a higher commode. I missed call from Dr. Jerene Pitch at 4:25pm on Friday- called her back but have not yet heard from her.   -prior d/c date considered: 03/24/21 (21 days)- patient should not need this long. Hoping he will be approved until 6/29. 2.  Antithrombotics: -DVT/anticoagulation:  Eliquis bid             -antiplatelet therapy: N/A 3. Pain Management: N/A 4. Mood: LCSW to follow for evaluation and support.              -antipsychotic agents: N/A 5. Neuropsych: This patient is  capable of making decisions on his own behalf. 6. Skin/Wound Care: Routine pressure relief measures.  7. Fluids/Electrolytes/Nutrition: Strict I/O. Electrolytes stable.  8. CAD/Chronic diastolic CHF: Monitor for signs of overload and check weight daily. --1500 cc FR/day. 6/25: weight stable, continue to monitor  Filed Weights   03/10/21 0544 03/11/21 0527 03/14/21 0531  Weight: (!) 168.1 kg (!) 173.1 kg (!) 171.4 kg   9. COPD: Continue Breo and Incruse daily. Wean O2 as tolerated. Goal sats 88-92%, currently 92. Breathing well.  10. OHS/OSA: Continue BIPAP with 2 Liters bled in PTA, currently using 3L  11. Acute on chronic renal failure:? He denies any CKD --SCr trending back to new baseline  around 1.3-1.4?             --Monitor for signs of overaload. Off lasix.  6/20 Cr down to 1.27, continue to monitor especially with additional doses of Lasix PRN.  12. Stasis dermatitis: Continue local care   Continue Eucerin BID and monitor- would suggest vaseline for home care to wife.  13. Constipation: continue 1/2 dose miralax PRN 14. Urinary urgency, chronic: this is very distressing to patient, so please attend to him as soon as possible. Would benefit from briefs and q1H timed voiding while awake.    15.  Bowel incont- will hold miralax, also on colchicine but only on 1 tab daily 16. Acute blood loss anemia: Hgb 12.0 on 6/21, up from 10.8, monitor weekly  LOS: 12 days A FACE TO FACE EVALUATION WAS PERFORMED  [name] 03/14/2021, 12:37 PM

## 2021-03-15 NOTE — Progress Notes (Signed)
PROGRESS NOTE   Patient says one of his therapists was sick yesterday and since there was only one therapist he was unable to walk, which he is disappointed about. Still waiting for his commode seat to be delivered.   ROS:  Pt denies SOB, abd pain, CP, N/V/D, +urinary frequency, +constipation, +lower extremity weakness   Objective:   No results found. No results for input(s): WBC, HGB, HCT, PLT in the last 72 hours.  No results for input(s): NA, K, CL, CO2, GLUCOSE, BUN, CREATININE, CALCIUM in the last 72 hours.   Intake/Output Summary (Last 24 hours) at 03/15/2021 1345 Last data filed at 03/15/2021 1300 Gross per 24 hour  Intake 500 ml  Output 320 ml  Net 180 ml     Pressure Injury 03/02/21 Sacrum Mid Stage 2 -  Partial thickness loss of dermis presenting as a shallow open injury with a red, pink wound bed without slough. Small circular area in crack (Active)  03/02/21 1720  Location: Sacrum  Location Orientation: Mid  Staging: Stage 2 -  Partial thickness loss of dermis presenting as a shallow open injury with a red, pink wound bed without slough.  Wound Description (Comments): Small circular area in crack  Present on Admission: Yes    Physical Exam: Vital Signs Blood pressure 137/67, pulse (!) 59, temperature (!) 97.5 F (36.4 C), temperature source Oral, resp. rate 19, height 6\' 2"  (1.88 m), weight (!) 174.1 kg, SpO2 96 %. Gen: no distress, normal appearing HEENT: oral mucosa pink and moist, NCAT Cardio: Bradycardic Chest: normal effort, normal rate of breathing, [place] in place Abd: soft, non-distended Ext: no edema Psych: pleasant, normal affect  Skin: BLE with kerlex single layer feet to knees, no weeping, stasis dermatitis changes seen Neurological:    Mental Status: He is alert. 5/5 strenght in upper extremities, 3/5 in proximal LE, 2/5 LE DF    Assessment/Plan: 1. Functional deficits which require 3+  hours per day of interdisciplinary therapy in a comprehensive inpatient rehab setting. Physiatrist is providing close team supervision and 24 hour management of active medical problems listed below. Physiatrist and rehab team continue to assess barriers to discharge/monitor patient progress toward functional and medical goals  Care Tool:  Bathing    Body parts bathed by patient: Right arm, Left arm, Chest, Abdomen, Face   Body parts bathed by helper: Right lower leg, Left lower leg     Bathing assist Assist Level: Set up assist (using AE)     Upper Body Dressing/Undressing Upper body dressing Upper body dressing/undressing activity did not occur (including orthotics): N/A What is the patient wearing?: Hospital gown only    Upper body assist Assist Level: Set up assist    Lower Body Dressing/Undressing Lower body dressing    Lower body dressing activity did not occur: N/A What is the patient wearing?:  (L knee brace)     Lower body assist Assist for lower body dressing: Independent with assitive device Assistive Device Comment: reacher to don L knee brace   Toileting Toileting Toileting Activity did not occur (Clothing management and hygiene only): N/A (no void or bm)  Toileting assist Assist for toileting: Total Assistance -  Patient < 25%     Transfers Chair/bed transfer  Transfers assist  Chair/bed transfer activity did not occur: Safety/medical concerns  Chair/bed transfer assist level: Contact Guard/Touching assist     Locomotion Ambulation   Ambulation assist   Ambulation activity did not occur: Safety/medical concerns  Assist level: Minimal Assistance - Patient > 75% Assistive device: Walker-platform Max distance: 8'   Walk 10 feet activity   Assist  Walk 10 feet activity did not occur: Safety/medical concerns  Assist level: 2 helpers Assistive device: Walker-platform   Walk 50 feet activity   Assist Walk 50 feet with 2 turns activity did  not occur: Safety/medical concerns  Assist level: 2 helpers Assistive device: Walker-platform    Walk 150 feet activity   Assist Walk 150 feet activity did not occur: Safety/medical concerns         Walk 10 feet on uneven surface  activity   Assist Walk 10 feet on uneven surfaces activity did not occur: Safety/medical concerns         Wheelchair     Assist Will patient use wheelchair at discharge?: Yes Type of Wheelchair: Manual Wheelchair activity did not occur: Safety/medical concerns  Wheelchair assist level: Independent (over ramp, around furniture and tight spaces) Max wheelchair distance: 300'    Wheelchair 50 feet with 2 turns activity    Assist    Wheelchair 50 feet with 2 turns activity did not occur: Safety/medical concerns   Assist Level: Independent   Wheelchair 150 feet activity     Assist  Wheelchair 150 feet activity did not occur: Safety/medical concerns   Assist Level: Independent   Blood pressure 137/67, pulse (!) 59, temperature (!) 97.5 F (36.4 C), temperature source Oral, resp. rate 19, height 6\' 2"  (1.88 m), weight (!) 174.1 kg, SpO2 96 %.  Medical Problem List and Plan: 1.  Cardiopulmonary debility with ICU myopathy- his strength is MUCH weaker than expected for just debility- His DF's are 2/5 at most- cannot put PRAFOs on due to poor condition of skin             -patient may shower   Continue CIR- PT, OT and SLP, will do peer-to-peer to request stay until Wednesday 6/29 when his commode seat arrives, so he can raise himself off a higher commode. I missed call from Dr. Jerene Pitch at 4:25pm on Friday- called her back but have not yet heard from her.   -prior d/c date considered: 03/24/21 (21 days)- patient should not need this long. Hoping he will be approved until 6/29. 2.  Antithrombotics: -DVT/anticoagulation:  Eliquis bid             -antiplatelet therapy: N/A 3. Pain Management: N/A 4. Mood: LCSW to follow for evaluation  and support.              -antipsychotic agents: N/A 5. Neuropsych: This patient is capable of making decisions on his own behalf. 6. Skin/Wound Care: Routine pressure relief measures.  7. Fluids/Electrolytes/Nutrition: Strict I/O. Electrolytes stable.  8. CAD/Chronic diastolic CHF: Monitor for signs of overload and check weight daily. --1500 cc FR/day. 6/26: weight increase from yesterday, but overall stable, continue to monitor. Filed Weights   03/11/21 0527 03/14/21 0531 03/15/21 0500  Weight: (!) 173.1 kg (!) 171.4 kg (!) 174.1 kg   9. COPD: Continue Breo and Incruse daily. Wean O2 as tolerated. Goal sats 88-92%, currently 92. Breathing well.  10. OHS/OSA: Continue BIPAP with 3 Liters bled in PTA, please ensure it  is given at 9pm when he likes to sleep, discussed with nursing.  11. Acute on chronic renal failure:? He denies any CKD --SCr trending back to new baseline around 1.3-1.4?             --Monitor for signs of overaload. Off lasix.  6/20 Cr down to 1.27, continue to monitor especially with additional doses of Lasix PRN.  12. Stasis dermatitis: Continue local care   Continue Eucerin BID and monitor- would suggest vaseline for home care to wife.  13. Constipation: continue 1/2 dose miralax PRN 14. Urinary urgency, chronic: this is very distressing to patient, so please attend to him as soon as possible. Would benefit from briefs and q1H timed voiding while awake.    15.  Bowel incont- will hold miralax, also on colchicine but only on 1 tab daily 16. Acute blood loss anemia: Hgb 12.0 on 6/21, up from 10.8, monitor weekly 17. Disposition: peer-to-peer pending as Bernadene Person has refused continued stay. Discussed with patient. Missed call from Dr. Jerene Pitch 4:25pm Friday- called her back and left voicemail. May need to reinitiate peer-to-peer on Monday.   LOS: 13 days A FACE TO FACE EVALUATION WAS PERFORMED  Martha Clan P Jerame Hedding 03/15/2021, 1:45 PM

## 2021-03-15 NOTE — Progress Notes (Signed)
RT note. Patient placed on home trilogy/bipap with 3L bled in. VSS, RT will continue to monitor

## 2021-03-16 LAB — CBC
HCT: 35.9 % — ABNORMAL LOW (ref 39.0–52.0)
Hemoglobin: 11.4 g/dL — ABNORMAL LOW (ref 13.0–17.0)
MCH: 32 pg (ref 26.0–34.0)
MCHC: 31.8 g/dL (ref 30.0–36.0)
MCV: 100.8 fL — ABNORMAL HIGH (ref 80.0–100.0)
Platelets: 131 K/uL — ABNORMAL LOW (ref 150–400)
RBC: 3.56 MIL/uL — ABNORMAL LOW (ref 4.22–5.81)
RDW: 16 % — ABNORMAL HIGH (ref 11.5–15.5)
WBC: 5.7 K/uL (ref 4.0–10.5)
nRBC: 0 % (ref 0.0–0.2)

## 2021-03-16 LAB — BASIC METABOLIC PANEL WITH GFR
Anion gap: 6 (ref 5–15)
BUN: 14 mg/dL (ref 8–23)
CO2: 33 mmol/L — ABNORMAL HIGH (ref 22–32)
Calcium: 8.9 mg/dL (ref 8.9–10.3)
Chloride: 101 mmol/L (ref 98–111)
Creatinine, Ser: 1.29 mg/dL — ABNORMAL HIGH (ref 0.61–1.24)
GFR, Estimated: 57 mL/min — ABNORMAL LOW (ref >60)
Glucose, Bld: 95 mg/dL (ref 70–99)
Potassium: 4.4 mmol/L (ref 3.5–5.1)
Sodium: 140 mmol/L (ref 135–145)

## 2021-03-16 MED ORDER — AMMONIUM LACTATE 12 % EX LOTN: TOPICAL_LOTION | Freq: Two times a day (BID) | CUTANEOUS | 0 refills | Status: AC

## 2021-03-16 MED ORDER — ACETAMINOPHEN 325 MG PO TABS: 325.0000 mg | ORAL_TABLET | ORAL | Status: AC | PRN

## 2021-03-16 MED ORDER — PHENAZOPYRIDINE HCL 95 MG PO TABS: 95.0000 mg | ORAL_TABLET | Freq: Three times a day (TID) | ORAL | Status: DC | PRN

## 2021-03-16 MED ORDER — FAMOTIDINE 40 MG PO TABS: 40.0000 mg | ORAL_TABLET | Freq: Every day | ORAL | 1 refills | Status: DC

## 2021-03-16 NOTE — Progress Notes (Signed)
PROGRESS NOTE   Labs reviewed LTJQZE 092' with therapy today  ROS:  Pt denies SOB, abd pain, CP, N/V/D, +urinary frequency, +constipation, +lower extremity weakness   Objective:   No results found. Recent Labs    03/16/21 0546  WBC 5.7  HGB 11.4*  HCT 35.9*  PLT 131*    Recent Labs    03/16/21 0546  NA 140  K 4.4  CL 101  CO2 33*  GLUCOSE 95  BUN 14  CREATININE 1.29*  CALCIUM 8.9     Intake/Output Summary (Last 24 hours) at 03/16/2021 1139 Last data filed at 03/16/2021 0700 Gross per 24 hour  Intake 460 ml  Output 420 ml  Net 40 ml      Pressure Injury 03/02/21 Sacrum Mid Stage 2 -  Partial thickness loss of dermis presenting as a shallow open injury with a red, pink wound bed without slough. Small circular area in crack (Active)  03/02/21 1720  Location: Sacrum  Location Orientation: Mid  Staging: Stage 2 -  Partial thickness loss of dermis presenting as a shallow open injury with a red, pink wound bed without slough.  Wound Description (Comments): Small circular area in crack  Present on Admission: Yes    Physical Exam: Vital Signs Blood pressure 138/62, pulse 60, temperature 98 F (36.7 C), resp. rate 19, height 6\' 2"  (1.88 m), weight (!) 169.5 kg, SpO2 92 %.  General: No acute distress Mood and affect are appropriate Heart: Regular rate and rhythm no rubs murmurs or extra sounds Lungs: Clear to auscultation, breathing unlabored, no rales or wheezes Abdomen: Positive bowel sounds, soft nontender to palpation, nondistended Extremities: No clubbing, cyanosis, or edema Skin: No evidence of breakdown, no evidence of rash    Skin: BLE with kerlex single layer feet to knees, no weeping, stasis dermatitis changes seen Neurological:    Mental Status: He is alert. 5/5 strenght in upper extremities, 3/5 in proximal LE, 2/5 LE DF    Assessment/Plan: 1. Functional deficits which require 3+ hours  per day of interdisciplinary therapy in a comprehensive inpatient rehab setting. Physiatrist is providing close team supervision and 24 hour management of active medical problems listed below. Physiatrist and rehab team continue to assess barriers to discharge/monitor patient progress toward functional and medical goals  Care Tool:  Bathing    Body parts bathed by patient: Right arm, Left arm, Chest, Abdomen, Face   Body parts bathed by helper: Right lower leg, Left lower leg     Bathing assist Assist Level: Set up assist (using AE)     Upper Body Dressing/Undressing Upper body dressing Upper body dressing/undressing activity did not occur (including orthotics): N/A What is the patient wearing?: Hospital gown only    Upper body assist Assist Level: Set up assist    Lower Body Dressing/Undressing Lower body dressing    Lower body dressing activity did not occur: N/A What is the patient wearing?:  (L knee brace)     Lower body assist Assist for lower body dressing: Independent with assitive device Assistive Device Comment: reacher to don L knee brace   Toileting Toileting Toileting Activity did not occur Landscape architect and hygiene only):  N/A (no void or bm)  Toileting assist Assist for toileting: Total Assistance - Patient < 25%     Transfers Chair/bed transfer  Transfers assist  Chair/bed transfer activity did not occur: Safety/medical concerns  Chair/bed transfer assist level: Contact Guard/Touching assist     Locomotion Ambulation   Ambulation assist   Ambulation activity did not occur: Safety/medical concerns  Assist level: Minimal Assistance - Patient > 75% Assistive device: Walker-platform Max distance: 8'   Walk 10 feet activity   Assist  Walk 10 feet activity did not occur: Safety/medical concerns  Assist level: 2 helpers Assistive device: Walker-platform   Walk 50 feet activity   Assist Walk 50 feet with 2 turns activity did not  occur: Safety/medical concerns  Assist level: 2 helpers Assistive device: Walker-platform    Walk 150 feet activity   Assist Walk 150 feet activity did not occur: Safety/medical concerns         Walk 10 feet on uneven surface  activity   Assist Walk 10 feet on uneven surfaces activity did not occur: Safety/medical concerns         Wheelchair     Assist Will patient use wheelchair at discharge?: Yes Type of Wheelchair: Manual Wheelchair activity did not occur: Safety/medical concerns  Wheelchair assist level: Independent (over ramp, around furniture and tight spaces) Max wheelchair distance: 300'    Wheelchair 50 feet with 2 turns activity    Assist    Wheelchair 50 feet with 2 turns activity did not occur: Safety/medical concerns   Assist Level: Independent   Wheelchair 150 feet activity     Assist  Wheelchair 150 feet activity did not occur: Safety/medical concerns   Assist Level: Independent   Blood pressure 138/62, pulse 60, temperature 98 F (36.7 C), resp. rate 19, height 6\' 2"  (1.88 m), weight (!) 169.5 kg, SpO2 92 %.  Medical Problem List and Plan: 1.  Cardiopulmonary debility with ICU myopathy- his strength is MUCH weaker than expected for just debility- His DF's are 2/5 at most- cannot put PRAFOs on due to poor condition of skin             -patient may shower   Continue CIR- PT, OT and SLP, plan D/C 6/29 needs bariatric equipment delivered , has O2 concentrator at home used at hs PTA 2.  Antithrombotics: -DVT/anticoagulation:  Eliquis bid             -antiplatelet therapy: N/A 3. Pain Management: N/A 4. Mood: LCSW to follow for evaluation and support.              -antipsychotic agents: N/A 5. Neuropsych: This patient is capable of making decisions on his own behalf. 6. Skin/Wound Care: Routine pressure relief measures.  7. Fluids/Electrolytes/Nutrition: Strict I/O. Electrolytes stable.  8. CAD/Chronic diastolic CHF: Monitor for  signs of overload and check weight daily. --1500 cc FR/day. 6/26: weight decreased today  Filed Weights   03/14/21 0531 03/15/21 0500 03/16/21 0621  Weight: (!) 171.4 kg (!) 174.1 kg (!) 169.5 kg   9. COPD: Continue Breo and Incruse daily. Wean O2 as tolerated. Goal sats 88-92%, currently 92. Breathing well.  10. OHS/OSA: Continue BIPAP with 3 Liters bled in PTA, please ensure it is given at 9pm when he likes to sleep, discussed with nursing.  11. Acute on chronic renal failure:resolved  12. Stasis dermatitis: Continue local care   Continue Eucerin BID and monitor- would suggest vaseline for home care to wife.  13. Constipation: continue  1/2 dose miralax PRN 14. Urinary urgency, chronic: this is very distressing to patient, so please attend to him as soon as possible. Would benefit from briefs and q1H timed voiding while awake.    15.  Bowel incont- will hold miralax, also on colchicine but only on 1 tab daily 16. Acute blood loss anemia: Hgb 12.0 on 6/21, up from 10.8, monitor weekly 17. Disposition: peer-to-peer pending as Bernadene Person has refused continued stay. Discussed with patient. Missed call from Dr. Jerene Pitch 4:25pm Friday- called her back and left voicemail. May need to reinitiate peer-to-peer on Monday.   LOS: 14 days A FACE TO FACE EVALUATION WAS PERFORMED  Charlett Blake 03/16/2021, 11:39 AM

## 2021-03-16 NOTE — Progress Notes (Addendum)
Patient ID: [name], male   DOB: Dec 03, 1940, 80 y.o.   MRN: [id number]  Met with pt to discuss no word yet from insurance regarding DC date, aware it will not be 7/5. He hopes for 6/29. Discussed family education prior to discharge and he feels no need. His son that lives with them pete works out of the home and his other son-Jeff who brings his wife to see him works from home. They have several people if he and wife are alone at home and he needs assist to call for this. He has his own system he feels works well for him and wife. Await insurance decision  10:45 AM Pt has been approved until 6/29 by insurance he was make aware of this.

## 2021-03-16 NOTE — Progress Notes (Signed)
Occupational Therapy Session Note  Patient Details  Name: [name] MRN: [id number] Date of Birth: 1941-07-13  Today's Date: 03/16/2021 OT Individual Time: 1000-1120 OT Individual Time Calculation (min): 80 min    Short Term Goals: Week 2:  OT Short Term Goal 1 (Week 2): Patient will complete LB dressing with AE PRN and LRAD mod A. OT Short Term Goal 2 (Week 2): Patient will complete 1/3 toileting tasks mod A using AE PRN. OT Short Term Goal 3 (Week 2): Patient will complete sit<>stand min A consistently with +2 for safety using LRAD. OT Short Term Goal 4 (Week 2): Patient will maintain standing balance for 2 minutes using LRAD in preparation for improved functional mobility/ADLs.  Skilled Therapeutic Interventions/Progress Updates:    Session 1: Pt received supine in bed, agreeable to OT, reporting no pain. Pt reporting frustrating over weekend lack of therapies and unable to get out of bed and walk, feeling as though that sets him further back in progress. Pt encouragement and therapeutic listening provided; encouragement of functional ambulation trials often at home each day to maintain progress with pt reporting he walks to bathroom every 1.5 hours. Sup>sit mod A with use of bed rails and HOB elevated. Donned B knee braces/ace wrap and max A for shoes due to time. Sit<>stand with platform RW mod-max A for upright boost from elevated bed height. Pt ambulated 109' with platform RW at [facility] and chair follow on 2L portable O2 via [place] with multiple turns. Pt became SOB and required sitting in chair CGA. At times, pt O2 saturation dropping in low 80s during functional ambulation; vc's for breathing techniques with some improvement. Min-mod A to scoot back in chair. Following seated rest break, pt completed 2 sit<>stand trials max A +2 for boost up; first trial ~3 min, second trial ~45 seconds. During first standing trial, pt completed marching in place 50x, removed one hand at a time to bring to  side to work on balance and simulate assisting with LB dressing, and practiced improving upright posture in stance with min A. Pt education on energy conservation strategies and importance of checking O2 sats at home. Pt taken back to room to complete UB bathing/dressing and grooming seated in recliner at sink with set up A using AE (long handled sponge). SW entered to inform pt his d/c will be Wednesday, 6/29. Pt education on need for family education in future PT/OT sessions prior to d/c. Pt remained seated in recliner, call bell in hand, all needs met.    Therapy Documentation Precautions:  Precautions Precautions: Fall, Other (comment) Precaution Comments: Watch SpO2, foley catheter Required Braces or Orthoses: Other Brace Other Brace: Has bilateral knee braces/compression wrap (for stability); left knee hinged brace then places elastic sleeve over brace; rt knee prefers ace wrap tightly for support Restrictions Weight Bearing Restrictions: No  Vital Signs:  On 2L O2 via [place] throughout session with O2 sats dropping to low 80s during marching in place and functional ambulation, improving with seated rest breaks.   Pain: Pain Assessment Pain Scale: 0-10 Pain Score: 0-No pain Faces Pain Scale: No hurt    Therapy/Group: Individual Therapy  Mellissa Kohut 03/16/2021, 12:51 PM

## 2021-03-16 NOTE — Progress Notes (Addendum)
Inpatient Rehabilitation Care Coordinator Discharge Note  The overall goal for the admission was met for:   Discharge location: Yes-HOME WITH WIFE AND SON  Length of Stay: Yes-16 DAYS  Discharge activity level: Yes-SUPERVISION-MIN LEVEL OF ASSIST  Home/community participation: Yes  Services provided included: MD, RD, PT, OT, RN, CM, Pharmacy, and SW  Financial Services: Private Insurance: [place] offered to/list presented to:PT AND WIFE  Follow-up services arranged: Home Health: [facility]- PT,OT,RN and Patient/Family request agency HH: ACTIVE PT, DME: HAS FROM PREVIOUS ADMITS  Comments (or additional information):PT FELT NO FAMILY EDUCATION NEEDED DUE TO [place].  *pt sent home with home o2 tank from [facility].   Patient/Family verbalized understanding of follow-up arrangements: Yes  Individual responsible for coordination of the follow-up plan: JEFFREY-SON [phone]  Confirmed correct DME delivered: Elease Hashimoto 03/16/2021    [place], Gard[place]

## 2021-03-16 NOTE — Progress Notes (Signed)
Physical Therapy Session Note  Patient Details  Name: [name] MRN: [id number] Date of Birth: [birth year]  Today's Date: 03/16/2021 PT Individual Time:Session1: 4098-1191; Session2: 1620-1700 PT Individual Time Calculation (min): 57 min & 40 min  Short Term Goals: Week 2:  PT Short Term Goal 1 (Week 2): Patient will ambulate at least 85' with mod A of 1. PT Short Term Goal 2 (Week 2): Patient will perform sit to stand with CGA. PT Short Term Goal 3 (Week 2): Patient will mobilize in power chair with S. PT Short Term Goal 4 (Week 2): Patient will perform sit to supine with mod A of 1  Skilled Therapeutic Interventions/Progress Updates:    Session1:  Patient up in shuttle chair in room with son and wife present.  Patient on 3L O2 at rest, placed on 2L for mobility.  Son reports brought up the toilet seat with lift for pt to try prior to purchasing.  States it fits over his toilet at home and son plans to reposition bidet for even better fit.  Showed photos of the sink next to it he uses to push up.  Patient attempted sit to stand from shuttle chair but unable with +2 A despite lifting seat higher.  Checked SpO2 noted in mid 80's so placed back on 3L O2.  Patient seated and reclined chair to allow pt to scoot back with assist.  Reports was able to walk this morning and got up to stand with very little help from the bed.  Patient seated to perform LAQ x 10 and hip abd/add x 10.  Discussed LE's stiffening up due to up in chair since 11 am.  Patient performed sit to stand from chair with min A of 2 for safety.  Ambulated 12' in the room and practiced transfers from power chair with 2 chair cushions in seat and pt able to stand with +2 mod to max A after pt attempting several times on his own.  Patient attempted transfer to/from power lift toilet seat, but unable to stand reporting bar under the seat makes it difficult to get his feet far enough back.  Able to get up partway, but could not get L  knee extended under him.  Patient sit to stand with +4 A for lifting.  Ambulated 10' to bed with PFRW and min A.  Patient sit to supine with mod A for LE's and +2 for scooting up in bed.  Patient left with call bell and needs in reach and bed alarm active.   [place]:  Patient in supine discussed feels he needs to try to power toilet seat lift again without wearing his shoes since he doesn't wear them at home and may be able to get feet placed better.  Patient supine to sit with S using bed rail and increased time.  Donned slipper socks and attempted sit to stand from EOB, but needed increased time and unable to get up easily.  Discussed legs more fatigued this pm after struggling earlier on toilet seat may be better to try first in morning.  Patient seated and assist to don shoes.  Sit to stand from elevated surface with mod A of 2.  Patient ambulated with PFRW and min A of 1 with chair follow for safety x 200'.  Sit to supine with mod A +2 for safety.  Doffed shoes and knee braces and left supine with call bell and needs in reach and bed alarm active and O2 on 3L as previously.  Therapy Documentation Precautions:  Precautions Precautions: Fall, Other (comment) Precaution Comments: Watch SpO2, foley catheter Required Braces or Orthoses: Other Brace Other Brace: Has bilateral knee braces/compression wrap (for stability); left knee hinged brace then places elastic sleeve over brace; rt knee prefers ace wrap tightly for support Restrictions Weight Bearing Restrictions: No  Pain: Pain Assessment Pain Scale: 0-10 Pain Score: 0-No pain Faces Pain Scale: No hurt     Therapy/Group: Individual Therapy  Reginia Naas Magda Kiel, PT 03/16/2021, 10:40 AM

## 2021-03-17 LAB — CBC
HCT: 33.8 % — ABNORMAL LOW (ref 39.0–52.0)
Hemoglobin: 10.8 g/dL — ABNORMAL LOW (ref 13.0–17.0)
MCH: 32.2 pg (ref 26.0–34.0)
MCHC: 32 g/dL (ref 30.0–36.0)
MCV: 100.9 fL — ABNORMAL HIGH (ref 80.0–100.0)
Platelets: 108 10*3/uL — ABNORMAL LOW (ref 150–400)
RBC: 3.35 MIL/uL — ABNORMAL LOW (ref 4.22–5.81)
RDW: 16.2 % — ABNORMAL HIGH (ref 11.5–15.5)
WBC: 5.6 10*3/uL (ref 4.0–10.5)
nRBC: 0 % (ref 0.0–0.2)

## 2021-03-17 NOTE — Progress Notes (Signed)
Occupational Therapy Discharge Summary  Patient Details  Name: [name] MRN: [id number] Date of Birth: 02/05/1941  Today's Date: 03/17/2021 OT Individual Time: 8416-6063 OT Individual Time Calculation (min): 73 min    Patient has met 3 of 9 long term goals due to improved activity tolerance, improved balance, postural control, and ability to compensate for deficits. Pt has made improvements in activity tolerance, standing balance/tolerance, functional ambulation, and general conditioning. Patient to discharge at max-total assist level for LB adls and toileting, set up-min A for UB adls/grooming, and mod A +2 for sit>stand and sit>supine mobility (CGA +2 for functional ambulation for safety and balance).  Patient's care partner requires assistance to provide the necessary physical assistance at discharge as +2 help is often needed for functional mobility and standing level ADLs.     Reasons goals not met: While pt has made significant progress since evaluation, pt continues to demonstrate limited endurance and oxygen support, decreased strength/conditioning, difficulties maintaining static and dynamic standing balance for over 3-4 minutes affecting standing level ADLs, and activity tolerance. 6 out of 9 LTGs were unmet largely due to difficulties progressing past required assistance (I.e. mod-occasional max A sit>stand) and insurance dictating earlier d/c than originally planned. Pt's goals were set to closely match levels of assistance required PTA, but pt continuing to require increased assistance in toileting, LB dressing, LB bathing, and functional transfers due to the previously mentioned limitations. Pt may be facing multiple safety concerns at home at d/c including transferring from toilet, bed, and power w/c.   Recommendation:  Patient will benefit from ongoing skilled OT services in home health setting to continue to advance functional skills in the area of BADL and Reduce care  partner burden, especially to make recommendations for home set up, home safety, fall prevention, and energy conservation.  Equipment: No equipment provided  Reasons for discharge:  Insurance dictating earlier discharge.  Patient/family agrees with progress made and goals achieved: Yes  OT Discharge Precautions/Restrictions  Precautions Precautions: Fall;Other (comment) Precaution Comments: Watch SpO2 Required Braces or Orthoses: Other Brace Other Brace: Has bilateral knee braces/compression wrap (for stability); left knee hinged brace then places elastic sleeve over brace; rt knee prefers ace wrap tightly for support Restrictions Weight Bearing Restrictions: No  Vital Signs Oxygen Therapy SpO2: (!) 88 % O2 Device: Nasal Cannula O2 Flow Rate (L/min): 3 L/min Pain Pain Assessment Pain Scale: 0-10 Pain Score: 0-No pain Faces Pain Scale: No hurt ADL ADL Equipment Provided: Long-handled sponge Eating: Set up Where Assessed-Eating: Chair Grooming: Setup Where Assessed-Grooming: Sitting at sink, Other (comment) (bariatric recliner) Upper Body Bathing: Setup Where Assessed-Upper Body Bathing: Sitting at sink, Chair Lower Body Bathing: Minimal assistance Where Assessed-Lower Body Bathing: Sitting at sink, Bed level, Chair Upper Body Dressing: Setup Where Assessed-Upper Body Dressing: Sitting at sink, Chair Lower Body Dressing: Maximal assistance, Dependent Where Assessed-Lower Body Dressing: Bed level (Pt declined wearing personal clothing during CIR stay; total A to don incontinence brief bed level, set up A to don L knee braces, dependent to don R knee ace wrap, max A would be needed to don shorts standing with PFRW) Toileting: Dependent, Maximal assistance Where Assessed-Toileting: Bed level, Other (Comment) (Urinal and bed pan in bed or recliner; pt unable to complete transfer to toilet or BSC due to low height.) Toilet Transfer: Maximal assistance Toilet Transfer Method:  Counselling psychologist: Other (comment) (Unable to complete to bariatric BSC; attempted pt's personal lift toilet seat DME, but requiring max A +2 to  transfer off) Tub/Shower Transfer: Not assessed Social research officer, government: Not assessed ADL Comments: PLOF: Sponge baths at home; uses AE for many ADLs, max A for LB/footwear dressing. Vision Baseline Vision/History: Wears glasses Wears Glasses: At all times Patient Visual Report: No change from baseline Vision Assessment?: No apparent visual deficits Perception  Perception: Within Functional Limits Praxis Praxis: Intact Cognition Overall Cognitive Status: Within Functional Limits for tasks assessed Arousal/Alertness: Awake/alert Orientation Level: Oriented X4 Attention: Sustained;Divided Sustained Attention: Appears intact Divided Attention: Appears intact Memory: Impaired Memory Impairment: Retrieval deficit Awareness: Appears intact Problem Solving: Appears intact Executive Function: Reasoning Reasoning: Appears intact Safety/Judgment: Appears intact Sensation Sensation Light Touch: Impaired by gross assessment Hot/Cold: Appears Intact Proprioception: Impaired by gross assessment Stereognosis: Appears Intact Additional Comments: decreased on R LE as compared to L and reports has been numb and weaker since TKA surgeries Coordination Gross Motor Movements are Fluid and Coordinated: No Fine Motor Movements are Fluid and Coordinated: Yes Coordination and Movement Description: GM movements limited due to BLE weakness, deconditioning, and body habitus Motor  Motor Motor: Other (comment) Motor - Discharge Observations: Continues with generalized weakness though improved significantly from admission Mobility  Bed Mobility Rolling Right: Moderate Assistance - Patient 50-74% Rolling Left: Moderate Assistance - Patient 50-74% Supine to Sit: Supervision/Verbal cueing (bed rails, increased time) Sit to Supine: Moderate  Assistance - Patient 50-74%;2 Helpers Scooting to [facility]: Maximal Assistance - Patient 25-49%;2 Helpers Transfers Sit to Stand: 2 Helpers;Moderate Assistance - Patient 50-74% Stand to Sit: Minimal Assistance - Patient > 75%;2 Helpers  Trunk/Postural Assessment  Cervical Assessment Cervical Assessment: Exceptions to [facility] (forward head) Thoracic Assessment Thoracic Assessment: Exceptions to [facility] (rounded shoulders) Lumbar Assessment Lumbar Assessment: Exceptions to [facility] (posterior pelvic tilt) Postural Control Postural Control: Deficits on evaluation Postural Limitations: Continued difficulties with trunk control to come into sitting EOB likely due to generalized weakness and body habitus, however improved from eval.  Balance Balance Balance Assessed: Yes Static Sitting Balance Static Sitting - Balance Support: No upper extremity supported;Feet supported Static Sitting - Level of Assistance: 5: Stand by assistance Dynamic Sitting Balance Dynamic Sitting - Balance Support: Feet supported;Left upper extremity supported;Right upper extremity supported;During functional activity Dynamic Sitting - Level of Assistance: 5: Stand by assistance Dynamic Sitting - Balance Activities: Lateral lean/weight shifting;Reaching across midline;Forward lean/weight shifting;Reaching for objects Static Standing Balance Static Standing - Balance Support: Bilateral upper extremity supported Static Standing - Level of Assistance: 5: Stand by assistance Extremity/Trunk Assessment RUE Assessment RUE Assessment: Exceptions to [facility] Active Range of Motion (AROM) Comments: WFL for functional tasks with use of AE for compensation, but AROM shoulder FLX to ~125 degrees General Strength Comments: WFL for functional tasks LUE Assessment LUE Assessment: Exceptions to [facility] Active Range of Motion (AROM) Comments: Decreased AROM for shoulder FLX to 80-90 degrees General Strength Comments: WFL for functional tasks  assessed   Skilled Therapeutic Intervention: Pt received seated in bariatric recliner, agreeable to OT, reporting no pain. Pt reported frustration with personal DME trials in PT stating he was unable to stand from the new lift toilet seat and from his power w/c without a lot of help. Pt reported his son was coming to try to raise up power w/c some and various options he is planning to try to raise his toilet seat up at home. Pt engaged in BUE and BLE HEP using green level 3 theraband and provided with handouts. Demoed proper techniques and understanding of importance; difficulties persist with BUE HEP due to decreased ROM in  bilateral shoulders which has been persisting PTA. Pt engaged in shoulder abduction, triceps pulls, biceps curls, forward punches, shoulder FLX, and wrist FLX/EXT, knee EXT, hip abduction/adduction, and hip internal/external rotation - all 10x each. Pt demoed fatigue and required rest breaks throughout; on 2L O2 via [place] and O2 saturation % decreased occasionally to low 80's improving with vc's for breathing techniques and counting strategies. Pt education and discussion at length of home safety, fall prevention, energy conservation strategies, having adequate physical assistance for mobility, home set up options, importance of continued HEP, and need for back up strategies for toileting/dressing/transfer options. Pt expressed anxiety towards going home at his current level of performance and need for assistance, but states he has lots of help at home from family/close neighbors. Pt stated he may be interested in hiring a caregiver part time when son who lives at home is at work since wife is unable to provide physical assistance needed during the day. Pt's wife and son entering at end of session and discussed some of safety concerns going home. Planned to trial sit<>stands during session, but unable due to lack of +2 help throughout session. Pt remained seated in recliner, all needs  met.   Franne Grip Chava Dulac 03/17/2021, 1:17 PM

## 2021-03-17 NOTE — Discharge Summary (Signed)
Physician Discharge Summary  Patient ID: [name] MRN: [id number] DOB/AGE: 79-Jan-1942 79 y.o.  Admit date: 03/02/2021 Discharge date: 03/18/2021  Discharge Diagnoses:  Principal Problem:   Intensive care (ICU) myopathy Active Problems:   Obesity, Class III, BMI 40-49.9 (morbid obesity) ([place])   Venous stasis dermatitis   Constipation   Essential hypertension   CKD stage 3 secondary to diabetes ([place])   Obesity hypoventilation syndrome (HCC)   Pressure injury of skin   Debility   Discharged Condition: good  Significant Diagnostic Studies: DG Chest 1 View  Result Date: 02/18/2021 CLINICAL DATA:  Status post thoracentesis EXAM: CHEST  1 VIEW COMPARISON:  Feb 17, 2021 chest radiograph and chest CT FINDINGS: No pneumothorax. Small residual pleural effusion on the right. No edema or airspace opacity. Stable cardiomegaly with pacemaker leads attached to right atrium and right ventricle. There is aortic atherosclerosis. Mitral annulus calcification noted. Status post aortic valve replacement. No adenopathy. Degenerative change in each shoulder noted. IMPRESSION: No pneumothorax. Small residual pleural effusion on the right. No edema or airspace opacity. Stable cardiac silhouette with postoperative changes. Aortic Atherosclerosis (ICD10-I70.0). Electronically Signed   By: Lowella Grip III M.D.   On: 02/18/2021 10:48   DG Chest 2 View  Result Date: 03/06/2021 CLINICAL DATA:  Short of breath.  History of CHF EXAM: CHEST - 2 VIEW COMPARISON:  02/21/2021 FINDINGS: Cardiac enlargement. Dual lead pacemaker unchanged. Mild vascular congestion without edema. Bibasilar atelectasis and bilateral effusions with mild progression from the prior study. IMPRESSION: Bibasilar atelectasis and small pleural effusions with mild progression. Negative for edema. Electronically Signed   By: Franchot Gallo M.D.   On: 03/06/2021 12:49   CT HEAD WO CONTRAST  Result Date: 02/22/2021 CLINICAL DATA:  Mental  status changes of unknown cause. EXAM: CT HEAD WITHOUT CONTRAST TECHNIQUE: Contiguous axial images were obtained from the base of the skull through the vertex without intravenous contrast. COMPARISON:  04/15/2020 FINDINGS: Brain: The brainstem and cerebellum are normal. Again demonstrated is a partially calcified meningioma primarily along the left side of the falx in the posterior frontal region measuring 2.6 x 2.3 x 2.4 cm. Mild extension to the right side of the falx. Some mass-effect upon the medial left frontal lobe, with mild adjacent edema. No sign of acute infarction, intra-axial mass lesion, hemorrhage, hydrocephalus or extra-axial collection. Vascular: There is atherosclerotic calcification of the major vessels at the base of the brain. Skull: Negative Sinuses/Orbits: Clear/normal Other: None IMPRESSION: No sign of acute infarction or intracranial hemorrhage. Partially calcified meningioma the posterior frontal falx, more to the left side than the right, measuring 2.6 x 2.3 x 2.4 cm, a couple of mm larger than was seen last year. Mass-effect upon the left medial frontal lobe with mild brain edema. Though this abnormality is notable, I would not expect it to be associated with an acute mental status change. Electronically Signed   By: Nelson Chimes M.D.   On: 02/22/2021 18:30   CT CHEST WO CONTRAST  Result Date: 02/17/2021 CLINICAL DATA:  Pneumonia, worsening shortness of breath, no prove mint with the IV antibiotics EXAM: CT CHEST WITHOUT CONTRAST TECHNIQUE: Multidetector CT imaging of the chest was performed following the standard protocol without IV contrast. COMPARISON:  None. FINDINGS: Cardiovascular: Left chest multi lead pacer. Aortic atherosclerosis. Aortic valve stent endograft. Cardiomegaly. Dense mitral annular calcifications. Trace pericardial effusion. Mediastinum/Nodes: No enlarged mediastinal, hilar, or axillary lymph nodes. Thyroid gland, trachea, and esophagus demonstrate no significant  findings. Lungs/Pleura: Moderate right, small  left pleural effusions and associated atelectasis or consolidation. Mild interlobular septal thickening throughout the lungs. Upper Abdomen: No acute abnormality. Musculoskeletal: No chest wall mass or suspicious bone lesions identified. IMPRESSION: 1. Moderate right, small left pleural effusions and associated atelectasis or consolidation. 2. Mild interlobular septal thickening throughout the lungs, consistent with mild pulmonary edema. 3. Cardiomegaly and trace pericardial effusion. 4. Aortic valve stent endograft. Aortic Atherosclerosis (ICD10-I70.0). Electronically Signed   By: Eddie Candle M.D.   On: 02/17/2021 13:29   DG CHEST PORT 1 VIEW  Result Date: 02/21/2021 CLINICAL DATA:  Short of breath. Paracentesis today. Concern for pneumothorax EXAM: PORTABLE CHEST 1 VIEW COMPARISON:  02/18/2021 FINDINGS: Stable enlarged cardiac silhouette. There is moderate RIGHT effusion unchanged. No pneumothorax. Pacer noted. IMPRESSION: No pneumothorax. Cardiomegaly, low lung volumes, and pleural effusion. Electronically Signed   By: Suzy Bouchard M.D.   On: 02/21/2021 09:12   DG CHEST PORT 1 VIEW  Result Date: 02/17/2021 CLINICAL DATA:  Atrial fibrillation. Weakness and shortness of breath. EXAM: PORTABLE CHEST 1 VIEW COMPARISON:  02/14/2021 FINDINGS: A dual lead pacemaker remains in place. The cardiac silhouette remains mildly enlarged. Lung volumes remain low with similar appearance of bibasilar airspace opacities but slight increase of mild right midlung opacity. A small right pleural effusion is not excluded. No pneumothorax is seen. IMPRESSION: Low lung volumes with unchanged bibasilar airspace opacities and slightly increased right midlung opacity. Findings could reflect atelectasis or pneumonia. Electronically Signed   By: Logan Bores M.D.   On: 02/17/2021 10:22   EEG adult  Result Date: 02/23/2021 Lora Havens, MD     02/23/2021  1:49 PM Patient Name:  [name] MRN: [id number] Epilepsy Attending: Lora Havens Referring Physician/Provider: Dr Hosie Poisson Date: 02/23/2021 Duration: 23.18 mins Patient history: 80 year old male with meningioma, altered mental status.  EEG done for seizures. Level of alertness: Awake AEDs during EEG study: Gabapentin Technical aspects: This EEG study was done with scalp electrodes positioned according to the 10-20 International system of electrode placement. Electrical activity was acquired at a sampling rate of 500Hz  and reviewed with a high frequency filter of 70Hz  and a low frequency filter of 1Hz . EEG data were recorded continuously and digitally stored. Description: The posterior dominant rhythm consists of 8 Hz activity of moderate voltage (25-35 uV) seen predominantly in posterior head regions, symmetric and reactive to eye opening and eye closing. EEG showed continuous generalized 5 to 7 Hz theta as well as intermittent generalized 2 to 3 Hz delta slowing. Hyperventilation and photic stimulation were not performed.   ABNORMALITY - Continuous slow, generalized IMPRESSION: This study is suggestive of mild diffuse encephalopathy, nonspecific etiology. No seizures or epileptiform discharges were seen throughout the recording. [name]   IR THORACENTESIS ASP PLEURAL SPACE W/IMG GUIDE  Result Date: 02/18/2021 INDICATION: Shortness of breath. Pneumonia. Right-sided pleural effusion. Request diagnostic and therapeutic thoracentesis. EXAM: ULTRASOUND GUIDED RIGHT THORACENTESIS MEDICATIONS: 1% plain lidocaine, 8 mL COMPLICATIONS: None immediate. PROCEDURE: An ultrasound guided thoracentesis was thoroughly discussed with the patient and questions answered. The benefits, risks, alternatives and complications were also discussed. The patient understands and wishes to proceed with the procedure. Written consent was obtained. Ultrasound was performed to localize and mark an adequate pocket of fluid in the right chest.  The area was then prepped and draped in the normal sterile fashion. 1% Lidocaine was used for local anesthesia. Under ultrasound guidance a 6 Fr Safe-T-Centesis catheter was introduced. Thoracentesis was performed. The catheter was removed  and a dressing applied. FINDINGS: A total of approximately 700 mL of serosanguineous pleural fluid was removed. Samples were sent to the laboratory as requested by the clinical team. IMPRESSION: Successful ultrasound guided right thoracentesis yielding 700 mL of pleural fluid. Read by: Ascencion Dike PA-C Electronically Signed   By: Jacqulynn Cadet M.D.   On: 02/18/2021 11:06    Labs:  Basic Metabolic Panel: BMP Latest Ref Rng & Units 03/16/2021 03/09/2021 03/03/2021  Glucose 70 - 99 mg/dL 95 93 91  BUN 8 - 23 mg/dL 14 16 21   Creatinine 0.61 - 1.24 mg/dL 1.29(H) 1.27(H) 1.34(H)  BUN/Creat Ratio 6 - 22 (calc) - - -  Sodium 135 - 145 mmol/L 140 141 139  Potassium 3.5 - 5.1 mmol/L 4.4 4.2 4.2  Chloride 98 - 111 mmol/L 101 99 99  CO2 22 - 32 mmol/L 33(H) 34(H) 34(H)  Calcium 8.9 - 10.3 mg/dL 8.9 9.3 9.1     CBC: CBC Latest Ref Rng & Units 03/17/2021 03/16/2021 03/09/2021  WBC 4.0 - 10.5 K/uL 5.6 5.7 5.1  Hemoglobin 13.0 - 17.0 g/dL 10.8(L) 11.4(L) 12.0(L)  Hematocrit 39.0 - 52.0 % 33.8(L) 35.9(L) 37.8(L)  Platelets 150 - 400 K/uL 108(L) 131(L) 147(L)     CBG: Recent Labs  Lab 03/10/21 2058 03/11/21 0605 03/11/21 1216 03/11/21 1617  GLUCAP 114* 99 104* 88    Brief HPI:   [name] is a 80 y.o. male with history of PAF, aortic stenosis s/p TEVAR, gout, morbid obesity, CKD, peripheral neuropathy, DDD cervical/lumbar spine, venous stasis dermatitis, OSA/OHS--on BiPAP with 3 L oxygen per [place], recent admission for decompensated respiratory failure was readmitted early a.m. on 02/15/2021 with inability to get off commode, DOE with hypoxia and weakness.  He was found to have acute on chronic respiratory failure with RLL airspace disease.  He was treated  with 5-day course of IV cefepime for PNA and CT chest showed moderate right and small left pleural effusion.  He underwent thoracocentesis of 700 cc serosanguineous fluid on 06/01 with improvement in respiratory symptoms.    On 06/05 was found to have worsening of BUEs myoclonic jerks with inability to speak.  EEG was negative for seizures and CT head showed possible calcified meningioma in posterior frontal left greater than right side of falx with mass-effect on middle frontal lobe.  Neurology was consulted and felt that symptoms were due to cervical stenosis and Keppra was recommended to help manage symptoms.  He is to follow-up with neurology after discharge.  Fluid status was improving and furosemide was placed on hold due to upward trending serum creatinine.  He continued to be limited by shortness of breath and fatigue, weakness as well as poor posture with standing attempts.  CIR was recommended due to functional decline.   Hospital Course: [name] was admitted to rehab 03/02/2021 for inpatient therapies to consist of PT and OT at least three hours five days a week. Past admission physiatrist, therapy team and rehab RN have worked together to provide customized collaborative inpatient rehab. His respiratory status has been stable and he has been compliant with CPAP use. His blood pressures were monitored on TID basis and has been controlled.  Weights were checked daily and he has been monitored for signs of overload.  He was maintained on 1500 cc fluid restriction during his stay. Serial check of lytes show that AKI has improved with SCr down 1.29.  He was briefly treated with as needed dose furosemide due to upward  trend in weight with improvement in his weight.  His weights have been trending down and is 371 pounds at discharge.  Lac-Hydrin was added to stasis dermatitis is bilateral shin followed with Eucerin and skin changes improving.    He was advised to weight himself today after  getting home and daily and if not able initially to monitor for peripheral edema, abdominal girth as well as respiratory symptoms.  He has been educated extensively about his current medication list, medication changes, use of Lasix on as needed basis only, importance of pulmonary hygiene, activity as well as use of oxygen with activity and at bedtime due to his OHS/OSA.  He continues to report shortness of breath with stable oxygenation at rest but continues to require 3 L oxygen with activity.   Acute on chronic renal failure has resolved and serial CBC showed that H/H is at baseline and platelets trending down to 122. No signs of bleeding noted and recommend repeat CBC in 5-7 days to monitor platelets and H/H.  He was making progress with PT tolerance and mobility with rehab and was on target to meet set goals but LOS was shortened per insurance denial of stay.  He currently requires supervision to min assistance.  He declined family education as he felt that he had necessary assistance at home to provide care needed.  He will continue to receive follow-up home health PT, OT and RN by and have at home health after discharge.   Rehab course: During patient's stay in rehab weekly team conferences were held to monitor patient's progress, set goals and discuss barriers to discharge. At admission, patient required min to max assist with ADL tasks and total assist with mobility. He has had improvement in activity tolerance, balance, postural control as well as ability to compensate for deficits. Awareness. He requires set up to min assist for UB care and max to total assist with LB ADLs.    Disposition: Home  Diet: Heart Healthy/1500 cc FR.   Special Instructions: Repeat CBC in one week to monitor H&H and platelets.  Monitor weights daily and use Lasix 20 mg as needed increase in weight overnight by 3 pounds or over 5 pounds in a week. Use flutter valve on 4 times daily basis.    Allergies as of  03/18/2021       Reactions   Sulfa Antibiotics    Other reaction(s): Unknown   Coumadin [warfarin Sodium] Other (See Comments)   States he can't be on this-bleeds out        Medication List     STOP taking these medications    CoQ10 100 MG Caps       TAKE these medications    acetaminophen 325 MG tablet Commonly known as: TYLENOL Take 1-2 tablets (325-650 mg total) by mouth every 4 (four) hours as needed for mild pain. What changed:  how much to take reasons to take this   albuterol 108 (90 Base) MCG/ACT inhaler Commonly known as: VENTOLIN HFA TAKE 2 PUFFS BY MOUTH EVERY 6 HOURS AS NEEDED   allopurinol 100 MG tablet Commonly known as: ZYLOPRIM Take 1 tablet (100 mg total) by mouth daily.   ammonium lactate 12 % lotion Commonly known as: LAC-HYDRIN Apply topically 2 (two) times daily. Apply to BLE twice a day and cover with light dressing. Wash off every morning and reply lotion Notes to patient: PURCHASE OVER THE COUNTER   atorvastatin 80 MG tablet Commonly known as: LIPITOR 1 tablet by mouth  daily   CENTRUM SILVER PO Take 1 tablet by mouth daily.   Eliquis 5 MG Tabs tablet Generic drug: apixaban TAKE 1 TABLET BY MOUTH TWICE A DAY   famotidine 40 MG tablet Commonly known as: PEPCID TAKE 1 TABLET BY MOUTH EVERY DAY   furosemide 20 MG tablet Commonly known as: LASIX Take 1 tablet (20 mg total) by mouth daily as needed for fluid or edema (for weight gain >3 lbs in one day or 5 lbs in one week).   gabapentin 300 MG capsule Commonly known as: NEURONTIN Take 1 capsule (300 mg total) by mouth 2 (two) times daily. What changed: when to take this   phenazopyridine 95 MG tablet Commonly known as: Azo-Standard Take 1 tablet (95 mg total) by mouth 3 (three) times daily as needed. What changed:  medication strength how much to take when to take this reasons to take this   polyethylene glycol 17 g packet Commonly known as: MIRALAX / GLYCOLAX Take 17 g by  mouth daily as needed.   TART CHERRY ADVANCED PO Take 1,200 mg by mouth 2 (two) times a day.   Trelegy Ellipta 100-62.5-25 MCG/INH Aepb Generic drug: Fluticasone-Umeclidin-Vilant Inhale 1 puff into the lungs daily.   vitamin C 500 MG tablet Commonly known as: ASCORBIC ACID Take 500 mg by mouth daily.   Vitamin D3 125 MCG (5000 UT) Caps Take 1,000 Units by mouth daily.        Follow-up Information     Meredith Staggers, MD. Call.   Specialty: Physical Medicine and Rehabilitation Why: As needed Contact information: [address] [place] [address] [phone]         [name], MD. Call in 1 day(s).   Specialty: Family Medicine Why: for post hospital follow up Contact information: [place] [address] [phone]                 Signed: Bary Leriche 03/18/2021, 4:47 PM

## 2021-03-17 NOTE — Progress Notes (Signed)
Physical Therapy Discharge Summary  Patient Details  Name: [name] MRN: [id number] Date of Birth: [birth year]  Today's Date: 03/17/2021 PT Individual Time: 0830-0930 PT Individual Time Calculation (min): 60 min   Skilled Therapeutic Interventions/Progress Updates:  Patient in supine and reports feeling well, but had some anxiety about going home earlier today.  States he does not want to keep the lift toilet seat since the bar on the front keeps him from getting his feet back far enough.  Donned knee braces in supine with A.  Patient supine to sit with S using rail and with increased time provided.  Patient sit to stand to BPFRW with increased bed height and CGA.  Patient ambulated to with PFRW and CGA x 200'.  Continues with flexed posture, heavy UE support especially when placing weight on R LE and pt looks at his feet as he places them.  Patient stand to sit on power w/c seat with two chair cushions.  Propelled x 200' with power chair independently but maintained on 3L O2 throughout.  Patient sit to stand from power w/c attempted x 3 without assist lifting hips, but unable to get legs extended and transition hands to [place] so mod A of 2 for sit to stand from power w/c.  Patient ambulated to shuttle chair and left sitting with call bell and needs in reach.  Patient has met 3 of 8 long term goals due to improved activity tolerance and increased strength.  Patient to discharge at  wheelchair for community, ambulatory in the home  level Mod Assist.   Patient's care partner unavailable to provide the necessary physical assistance at discharge.  Reasons goals not met: Patient on target to meet goals for ambulation and bed/chair transfers by next week, but LOS limited per insurance.  Patient will continue to progress with follow up HHPT and though family did not come in for training, pt feels they have the system they have used in the past when he has discharged home from hospital stay that will  help him be successful in the home.   Recommendation:  Patient will benefit from ongoing skilled PT services in home health setting to continue to advance safe functional mobility, address ongoing impairments in LE strength for transfers, and bed mobility, and minimize fall risk.  Equipment: No equipment provided  Reasons for discharge: discharge from hospital  Patient/family agrees with progress made and goals achieved: Yes  PT Discharge Precautions/Restrictions Precautions Precautions: Fall;Other (comment) Precaution Comments: Watch SpO2 Required Braces or Orthoses: Other Brace Other Brace: Has bilateral knee braces/compression wrap (for stability); left knee hinged brace then places elastic sleeve over brace; rt knee prefers ace wrap tightly for support Restrictions Weight Bearing Restrictions: No  Pain Pain Assessment Pain Scale: 0-10 Pain Score: 0-No pain Faces Pain Scale: No hurt Vision/Perception  Perception Perception: Within Functional Limits Praxis Praxis: Intact  Cognition Overall Cognitive Status: Within Functional Limits for tasks assessed Arousal/Alertness: Awake/alert Orientation Level: Oriented X4 Attention: Sustained;Divided Sustained Attention: Appears intact Divided Attention: Appears intact Memory: Impaired Memory Impairment: Retrieval deficit Awareness: Appears intact Problem Solving: Appears intact Executive Function: Reasoning Reasoning: Appears intact Safety/Judgment: Appears intact Sensation Sensation Light Touch: Impaired by gross assessment Hot/Cold: Appears Intact Proprioception: Impaired by gross assessment Stereognosis: Appears Intact Additional Comments: decreased on R LE as compared to L and reports has been numb and weaker since TKA surgeries Coordination Gross Motor Movements are Fluid and Coordinated: No Fine Motor Movements are Fluid and Coordinated: Yes Coordination and Movement  Description: GM movements limited due to BLE  weakness, deconditioning, and body habitus Motor  Motor Motor: Other (comment) Motor - Discharge Observations: Continues with generalized weakness though improved significantly from admission  Mobility Bed Mobility Rolling Right: Moderate Assistance - Patient 50-74% Rolling Left: Moderate Assistance - Patient 50-74% Supine to Sit: Supervision/Verbal cueing (using bed rails with time) Sit to Supine: Moderate Assistance - Patient 50-74% Scooting to [facility]: Minimal Assistance - Patient > 75% Transfers Sit to Stand: 2 Helpers;Moderate Assistance - Patient 50-74% Stand to Sit: Contact Guard/Touching assist Transfer (Assistive device): Bilateral platform walker Locomotion  Gait Ambulation: Yes Gait Assistance: Contact Guard/Touching assist Gait Distance (Feet): 200 Feet Assistive device: Bilateral platform walker Gait Gait: Yes Gait Pattern: Impaired Gait Pattern: Step-to pattern;Decreased stride length;Decreased dorsiflexion - left;Decreased dorsiflexion - right;Trunk flexed;Wide base of support Stairs / Additional Locomotion Stairs: No Architect: Yes Wheelchair Assistance: Independent with Camera operator: Power Wheelchair Parts Management: Needs assistance Distance: 200'  Trunk/Postural Assessment  Cervical Assessment Cervical Assessment: Exceptions to [facility] (forward head) Thoracic Assessment Thoracic Assessment: Exceptions to [facility] (rounded shoulders) Lumbar Assessment Lumbar Assessment: Exceptions to [facility] (post pelvic tilt) Postural Control Postural Control: Within Functional Limits Postural Limitations: Continued difficulties with trunk control to come into sitting EOB likely due to generalized weakness and body habitus, however improved from eval.  Balance Balance Balance Assessed: Yes Static Sitting Balance Static Sitting - Balance Support: No upper extremity supported;Feet supported Static Sitting - Level of Assistance:  6: Modified independent (Device/Increase time) Dynamic Sitting Balance Dynamic Sitting - Balance Support: Feet supported;Left upper extremity supported;Right upper extremity supported;During functional activity Dynamic Sitting - Level of Assistance: 5: Stand by assistance Dynamic Sitting - Balance Activities: Lateral lean/weight shifting;Forward lean/weight shifting;Reaching for objects;Reaching across midline Static Standing Balance Static Standing - Balance Support: Bilateral upper extremity supported Static Standing - Level of Assistance: 5: Stand by assistance Extremity Assessment  RUE Assessment RUE Assessment: Exceptions to [facility] Active Range of Motion (AROM) Comments: WFL for functional tasks with use of AE for compensation, but AROM shoulder FLX to ~125 degrees General Strength Comments: WFL for functional tasks LUE Assessment LUE Assessment: Exceptions to [facility] Active Range of Motion (AROM) Comments: Decreased AROM for shoulder FLX to 80-90 degrees General Strength Comments: WFL for functional tasks assessed RLE Assessment RLE Assessment: Exceptions to [facility] Passive Range of Motion (PROM) Comments: limited hip flexion due to body habitus and knee surgeries, can flex about 60 in supine, tight hip external rotators Active Range of Motion (AROM) Comments: limited ankle and toe AROM, numbness and weakness since knee surgeries, knee flexion AROM about 50, hip flexion limited due to weakness General Strength Comments: hip flexion 3-/5, knee extension 3+/5, ankle DF 2-/5 LLE Assessment LLE Assessment: Exceptions to [facility] Passive Range of Motion (PROM) Comments: limited hip flexion due to body habitus, limited at knee due to knee brace on, limited ankle DF to about neutral Active Range of Motion (AROM) Comments: hip flexion about 20, knee flexion about 60, ankle neutral General Strength Comments: hip flexion 3+/5, knee extension 4/5, ankle DF 3-/5    Jamison Oka, PT 03/17/2021,  1:17 PM

## 2021-03-17 NOTE — Plan of Care (Signed)
Problem: Sit to Stand Goal: LTG:  Patient will perform sit to stand in prep for activites of daily living with assistance level (OT) Description: LTG:  Patient will perform sit to stand in prep for activites of daily living with assistance level (OT) Outcome: Not Met (add Reason) Note: Pt required min-mod +2 or occasional max A +2 for sit>stand using PFRW for upright boost and required increased elevated surface height for success. Did not meet spvsn level goal by d/c.   Problem: RH Dressing Goal: LTG Patient will perform upper body dressing (OT) Description: LTG Patient will perform upper body dressing with assist, with/without cues (OT). Outcome: Not Met (add Reason) Note: Pt requiring set up-min A at d/c for UB dressing. Goal: LTG Patient will perform lower body dressing w/assist (OT) Description: LTG: Patient will perform lower body dressing with assist, with/without cues in positioning using equipment (OT) Outcome: Not Met (add Reason) Note: Pt requiring max-total A for LB dressing at d/c, both supine and standing.    Problem: RH Toileting Goal: LTG Patient will perform toileting task (3/3 steps) with assistance level (OT) Description: LTG: Patient will perform toileting task (3/3 steps) with assistance level (OT)  Outcome: Not Met (add Reason) Note: Pt unable to complete toilet transfers and therefore needing total A bed level for toileting at d/c; even if in chair, pt requiring mod-max A to complete.    Problem: RH Toilet Transfers Goal: LTG Patient will perform toilet transfers w/assist (OT) Description: LTG: Patient will perform toilet transfers with assist, with/without cues using equipment (OT) Outcome: Not Met (add Reason) Note: Unable to complete toilet transfers to [facility] in CIR due to low height and requiring max A +2 or lift transfer; trialed toilet transfer to lift toilet but unable to complete without +2 A.    Problem: RH Furniture Transfers Goal: LTG Patient will  perform furniture transfers w/assist (OT/PT) Description: LTG: Patient will perform furniture transfers  with assistance (OT/PT). Outcome: Not Met (add Reason) Note: Pt requiring at least CGA for furniture transfer to lift chair at d/c for safety and balance due to BLE weakness.   Problem: RH Balance Goal: LTG: Patient will maintain dynamic sitting balance (OT) Description: LTG:  Patient will maintain dynamic sitting balance with assistance during activities of daily living (OT) Outcome: Completed/Met   Problem: RH Grooming Goal: LTG Patient will perform grooming w/assist,cues/equip (OT) Description: LTG: Patient will perform grooming with assist, with/without cues using equipment (OT) Outcome: Completed/Met   Problem: RH Bathing Goal: LTG Patient will bathe all body parts with assist levels (OT) Description: LTG: Patient will bathe all body parts with assist levels (OT) Outcome: Completed/Met Note: While pt did not complete washing buttocks, he uses bidet at home for this; completed all other bathing with use of AE PRN at set up-min A level.    Problem: RH Balance Goal: LTG Patient will maintain dynamic standing with ADLs (OT) Description: LTG:  Patient will maintain dynamic standing balance with assist during activities of daily living (OT)  Outcome: Not Applicable Note: Pt did not complete dynamic standing ADLs PTA; completed few static standing ADLs with assistance. Discontinue goal.   Problem: RH Simple Meal Prep Goal: LTG Patient will perform simple meal prep w/assist (OT) Description: LTG: Patient will perform simple meal prep with assistance, with/without cues (OT). Outcome: Not Applicable Note: Discontinued goal due to slow progress in functional mobility and limited use of personal power w/c. Simple meal prep likely at set up-min A level at d/c.  Problem: RH Tub/Shower Transfers Goal: LTG Patient will perform tub/shower transfers w/assist (OT) Description: LTG: Patient  will perform tub/shower transfers with assist, with/without cues using equipment (OT) Outcome: Not Applicable Note: Discontinued due to pt completing sponge baths at home on toilet PTA.

## 2021-03-17 NOTE — Plan of Care (Signed)
Incontinent of bladder, toileting and using urinal but dribbles

## 2021-03-17 NOTE — Progress Notes (Signed)
PROGRESS NOTE   Labs reviewed Pt a little nervous regarding D/C in am , did transfer with PT supervison x 2 rolling walker to [place] chair ROS:  Pt denies SOB, abd pain, CP, N/V/D, +urinary frequency, +constipation, +lower extremity weakness   Objective:   No results found. Recent Labs    03/16/21 0546 03/17/21 0620  WBC 5.7 5.6  HGB 11.4* 10.8*  HCT 35.9* 33.8*  PLT 131* 108*     Recent Labs    03/16/21 0546  NA 140  K 4.4  CL 101  CO2 33*  GLUCOSE 95  BUN 14  CREATININE 1.29*  CALCIUM 8.9      Intake/Output Summary (Last 24 hours) at 03/17/2021 0858 Last data filed at 03/17/2021 2924 Gross per 24 hour  Intake --  Output 400 ml  Net -400 ml      Pressure Injury 03/02/21 Sacrum Mid Stage 2 -  Partial thickness loss of dermis presenting as a shallow open injury with a red, pink wound bed without slough. Small circular area in crack (Active)  03/02/21 1720  Location: Sacrum  Location Orientation: Mid  Staging: Stage 2 -  Partial thickness loss of dermis presenting as a shallow open injury with a red, pink wound bed without slough.  Wound Description (Comments): Small circular area in crack  Present on Admission: Yes    Physical Exam: Vital Signs Blood pressure 126/78, pulse 71, temperature 98.2 F (36.8 C), temperature source Oral, resp. rate 20, height 6\' 2"  (1.88 m), weight (!) 168.2 kg, SpO2 95 %.  General: No acute distress Mood and affect are appropriate Heart: Regular rate and rhythm no rubs murmurs or extra sounds Lungs: Clear to auscultation, breathing unlabored, no rales or wheezes Abdomen: Positive bowel sounds, soft nontender to palpation, nondistended Extremities: 2+ edema bilateral pretibial and feet, is able to wear gym shoe  Skin: BLE with kerlex single layer feet to knees, no weeping, stasis dermatitis changes seen Neurological:    Mental Status: He is alert. 5/5 strenght in upper  extremities, 3/5 in proximal LE, 2/5 LE DF    Assessment/Plan: 1. Functional deficits which require 3+ hours per day of interdisciplinary therapy in a comprehensive inpatient rehab setting. Physiatrist is providing close team supervision and 24 hour management of active medical problems listed below. Physiatrist and rehab team continue to assess barriers to discharge/monitor patient progress toward functional and medical goals  Care Tool:  Bathing    Body parts bathed by patient: Right arm, Left arm, Chest, Abdomen, Face   Body parts bathed by helper: Right lower leg, Left lower leg     Bathing assist Assist Level: Set up assist     Upper Body Dressing/Undressing Upper body dressing Upper body dressing/undressing activity did not occur (including orthotics): N/A What is the patient wearing?: Hospital gown only    Upper body assist Assist Level: Set up assist    Lower Body Dressing/Undressing Lower body dressing    Lower body dressing activity did not occur: N/A What is the patient wearing?:  (L knee brace)     Lower body assist Assist for lower body dressing: Independent with assitive device Assistive Device Comment: reacher  to don L knee brace   Toileting Toileting Toileting Activity did not occur (Clothing management and hygiene only): N/A (no void or bm)  Toileting assist Assist for toileting: Total Assistance - Patient < 25%     Transfers Chair/bed transfer  Transfers assist  Chair/bed transfer activity did not occur: Safety/medical concerns  Chair/bed transfer assist level: Contact Guard/Touching assist     Locomotion Ambulation   Ambulation assist   Ambulation activity did not occur: Safety/medical concerns  Assist level: Minimal Assistance - Patient > 75% Assistive device: Walker-platform Max distance: 200'   Walk 10 feet activity   Assist  Walk 10 feet activity did not occur: Safety/medical concerns  Assist level: Contact Guard/Touching  assist Assistive device: Walker-platform   Walk 50 feet activity   Assist Walk 50 feet with 2 turns activity did not occur: Safety/medical concerns  Assist level: Minimal Assistance - Patient > 75% Assistive device: Walker-platform    Walk 150 feet activity   Assist Walk 150 feet activity did not occur: Safety/medical concerns  Assist level: Minimal Assistance - Patient > 75%      Walk 10 feet on uneven surface  activity   Assist Walk 10 feet on uneven surfaces activity did not occur: Safety/medical concerns         Wheelchair     Assist Will patient use wheelchair at discharge?: Yes Type of Wheelchair: Manual Wheelchair activity did not occur: Safety/medical concerns  Wheelchair assist level: Independent (over ramp, around furniture and tight spaces) Max wheelchair distance: 300'    Wheelchair 50 feet with 2 turns activity    Assist    Wheelchair 50 feet with 2 turns activity did not occur: Safety/medical concerns   Assist Level: Independent   Wheelchair 150 feet activity     Assist  Wheelchair 150 feet activity did not occur: Safety/medical concerns   Assist Level: Independent   Blood pressure 126/78, pulse 71, temperature 98.2 F (36.8 C), temperature source Oral, resp. rate 20, height 6\' 2"  (1.88 m), weight (!) 168.2 kg, SpO2 95 %.  Medical Problem List and Plan: 1.  Cardiopulmonary debility with ICU myopathy/neuropathy - his strength is MUCH weaker than expected for just debility- His DF's are 2/5 at most-             -patient may shower   Continue CIR- PT, OT and SLP, plan D/C 6/29 needs bariatric equipment delivered , has O2 concentrator at home used at hs PTA 2.  Antithrombotics: -DVT/anticoagulation:  Eliquis bid             -antiplatelet therapy: N/A 3. Pain Management: N/A 4. Mood: LCSW to follow for evaluation and support.              -antipsychotic agents: N/A 5. Neuropsych: This patient is capable of making decisions on his  own behalf. 6. Skin/Wound Care: Routine pressure relief measures.  7. Fluids/Electrolytes/Nutrition: Strict I/O. Electrolytes stable.  8. CAD/Chronic diastolic CHF: Monitor for signs of overload and check weight daily. --1500 cc FR/day. 6/26: weight decreased today  Filed Weights   03/15/21 0500 03/16/21 0621 03/17/21 0527  Weight: (!) 174.1 kg (!) 169.5 kg (!) 168.2 kg   9. COPD: Continue Breo and Incruse daily. Wean O2 as tolerated. Goal sats 88-92%, currently 92. Breathing well.  10. OHS/OSA: Continue BIPAP with 3 Liters  PTA, please ensure it is given at 9pm when he likes to sleep, discussed with nursing.  11. Acute on chronic renal failure:resolved  12. Stasis  dermatitis: Continue local care   Continue Eucerin BID and monitor- would suggest vaseline for home care to wife.  13. Constipation: continue 1/2 dose miralax PRN 14. Urinary urgency, chronic: this is very distressing to patient, so please attend to him as soon as possible. Would benefit from briefs and q1H timed voiding while awake.    15.  Bowel incont- will hold miralax, also on colchicine but only on 1 tab daily 16. Acute blood loss anemia: Hgb 12.0 on 6/21, up from 10.8, monitor weekly 17. Disposition: peer-to-peer pending as Bernadene Person has refused continued stay. D/C in am , pt appears ready  LOS: 15 days A FACE TO [place] Damiyah Ditmars 03/17/2021, 8:58 AM

## 2021-03-17 NOTE — Plan of Care (Signed)
  Problem: RH Balance Goal: LTG Patient will maintain dynamic sitting balance (PT) Description: LTG:  Patient will maintain dynamic sitting balance with assistance during mobility activities (PT) Outcome: Completed/Met Goal: LTG Patient will maintain dynamic standing balance (PT) Description: LTG:  Patient will maintain dynamic standing balance with assistance during mobility activities (PT) Outcome: Completed/Met   Problem: Sit to Stand Goal: LTG:  Patient will perform sit to stand with assistance level (PT) Description: LTG:  Patient will perform sit to stand with assistance level (PT) Outcome: Not Met (add Reason) Note: Needing at least min A up to mod A of 2 for sit to stand height dependent due to LE weakness and discharge date moved up 1 week due to insurance constraints   Problem: RH Bed Mobility Goal: LTG Patient will perform bed mobility with assist (PT) Description: LTG: Patient will perform bed mobility with assistance, with/without cues (PT). Outcome: Not Met (add Reason) Note: Able to complete supine to sit with supervision, but mod A for both legs into bed and max A for rolling.  Limited LOS due to insurance constraints.   Problem: RH Bed to Chair Transfers Goal: LTG Patient will perform bed/chair transfers w/assist (PT) Description: LTG: Patient will perform bed to chair transfers with assistance (PT). Outcome: Not Met (add Reason) Note: CGA for safety at this point for bed/chair transfers, LOS limited with insurance constraints.   Problem: RH Ambulation Goal: LTG Patient will ambulate in controlled environment (PT) Description: LTG: Patient will ambulate in a controlled environment, # of feet with assistance (PT). Outcome: Not Met (add Reason) Note: CGA for safety with ambulation at this point and LOS limited due to insurance constraints. Goal: LTG Patient will ambulate in home environment (PT) Description: LTG: Patient will ambulate in home environment, # of feet with  assistance (PT). Outcome: Not Met (add Reason) Note: CGA for safety with LOS limited by insurance constraints   Problem: RH Wheelchair Mobility Goal: LTG Patient will propel w/c in controlled environment (PT) Description: LTG: Patient will propel wheelchair in controlled environment, # of feet with assist (PT) Outcome: Completed/Met  Magda Kiel, PT

## 2021-03-17 NOTE — Patient Care Conference (Signed)
Inpatient RehabilitationTeam Conference and Plan of Care Update Date: 03/17/2021   Time: 12:06 PM    Patient Name: [name] [facility]      Medical Record Number: [id number]  Date of Birth: April 04, 1941 Sex: Male         Room/Bed: 4W15C/4W15C-01 Payor Info: Payor: [employer] MEDICARE / Plan: [employer] MEDICARE HMO/PPO / Product Type: *No Product type* /    Admit Date/Time:  03/02/2021  4:43 PM  Primary Diagnosis:  Intensive care (ICU) myopathy  Hospital Problems: Principal Problem:   Intensive care (ICU) myopathy Active Problems:   Pressure injury of skin   Debility    Expected Discharge Date: Expected Discharge Date: 03/18/21  Team Members Present: Physician leading conference: Dr. Alger Simons Care Coodinator Present: Dorien Chihuahua, RN, BSN, CRRN;[name], LCSW Nurse Present: Dorien Chihuahua, RN PT Present: Magda Kiel, PT OT Present: Other (comment) Hulda Marin, OT) PPS Coordinator present : Gunnar Fusi, SLP     Current Status/Progress Goal Weekly Team Focus  Bowel/Bladder             Swallow/Nutrition/ Hydration             ADL's   Max A/dependent LB dressing and toileting, can complete UB/LB bathing set up A with AE, set up grooming, supine>sit min A, mod A +2 sit>supine, sit>stand from elevated surface min-mod A +2, increasing functional ambulation distances with platform RW and tolerate standing <5 min if not ambulating, unable to complete BSC transfer in CIR due to low height  mod A ADL, min A transfer  general conditioning, bed mobility, sit<>stand, transfer training, adl training, endurance, family ed   Mobility   mod A sit to supine, S supine to sit, still +2 mod A at times for sit to stand, but CGA at times as well from higher surface, ambulation x 200' with PFRW and min A (+2 for chair follow), power w/c mod I  S short distance ambulation, for w/c mobility in power chair from home, CGA standing dynamic balance.  transfers from different heights, safety, endurance  with ambulation   Communication             Safety/Cognition/ Behavioral Observations            Pain             Skin               Discharge Planning:  Home tomorrow with wife and son, along with others in place to assist if gets in a bind at home when son working. Pt has no concerns and son feels have been caring for him long enough no family training needed   Team Discussion: CHF in balance and medically stable per MD. Team reviewed lay out of home and toilet access, safety and family education. Patient on target to meet rehab goals: Was on target to meet goals however will not meet goals as LOS decreased due to insurance coverage. Currently Max dependent for toileting, dressing and lower body care. Able to complete upper body bathing and lower body care using adaptive equipment after set up. +2 assist for safety during power up from seated position.   *See Care Plan and progress notes for long and short-term goals.   Revisions to Treatment Plan:  Working on balance and getting feet under him with standing.  Teaching Needs: Safety, set up for activities, skin care, medications, etc.  Current Barriers to Discharge: Decreased caregiver support, Home enviroment access/layout, Wound care, and Weight  Possible Resolutions to Barriers: Family education      Medical Summary Current Status: debility, strength improving. skin improved. weights balanced. nutrition improved  Barriers to Discharge: Medical stability   Possible Resolutions to [name] Focus: daily assessment of labs and CV parameters. finalize medical plan for discharge   Continued Need for Acute Rehabilitation Level of Care: The patient requires daily medical management by a physician with specialized training in physical medicine and rehabilitation for the following reasons: Direction of a multidisciplinary physical rehabilitation program to maximize functional independence : Yes Medical management of  patient stability for increased activity during participation in an intensive rehabilitation regime.: Yes Analysis of laboratory values and/or radiology reports with any subsequent need for medication adjustment and/or medical intervention. : Yes   I attest that I was present, lead the team conference, and concur with the assessment and plan of the team.   Margarito Liner 03/17/2021, 4:32 PM

## 2021-03-17 NOTE — Progress Notes (Signed)
Patient ID: [name], male   DOB: [birth year], 80 y.o.   MRN: [id number] Pt feels ready for discharge tomorrow and will figure out issues as they come. Aware of team conference progress and insurance denial as of tomorrow here. Home health arranged was seeing prior to admission

## 2021-03-17 NOTE — Progress Notes (Signed)
Occupational Therapy Session Note  Patient Details  Name: [name] MRN: [id number] Date of Birth: 1941/08/29  Today's Date: 03/17/2021 OT Individual Time: 1224-8250 OT Individual Time Calculation (min): 43 min    Short Term Goals: Week 1:  OT Short Term Goal 1 (Week 1): Patient will complete LB dressing mod A with AE PRN and LRAD. OT Short Term Goal 1 - Progress (Week 1): Not met OT Short Term Goal 2 (Week 1): Patient will complete 1/3 toileting tasks with mod A and AE PRN. OT Short Term Goal 2 - Progress (Week 1): Not met OT Short Term Goal 3 (Week 1): Patient will complete sit<>stand max A of one helper in preparation for functional transfers. OT Short Term Goal 3 - Progress (Week 1): Not met OT Short Term Goal 4 (Week 1): Patient will complete UB bathing/dressing set up A using AE PRN. OT Short Term Goal 4 - Progress (Week 1): Met Week 2:  OT Short Term Goal 1 (Week 2): Patient will complete LB dressing with AE PRN and LRAD mod A. OT Short Term Goal 2 (Week 2): Patient will complete 1/3 toileting tasks mod A using AE PRN. OT Short Term Goal 3 (Week 2): Patient will complete sit<>stand min A consistently with +2 for safety using LRAD. OT Short Term Goal 4 (Week 2): Patient will maintain standing balance for 2 minutes using LRAD in preparation for improved functional mobility/ADLs.    Skilled Therapeutic Interventions/Progress Updates:    Pt greeted at time of session semireclined in bed resting agreeable to OT session, but just called NT to assist with bed pan, needing to have BM. Bed mobility rolling to R side with 2 helpers for dependent placement of bed pan. Dependent for hygiene bed level with training for using bed rails for rolling to decrease caregiver burden. Supine > sit Min/Mod A of 1 using bed features, sit <> stand with Mod of 1 and CGA from 2nd helper, static standing at [place] approx 1 minute before fatigue. Sit > stand a second time same manner and side step toward  [facility] CGA w/ AD. Sit > supine 2 helpers for BLEs and scoot up with trendelenburg feature. Note remained on O2 2L throughout and O2 sats dropped to low 80s with exertion but quickly rebounded. Alarm on call bell in reach.   Therapy Documentation Precautions:  Precautions Precautions: Fall, Other (comment) Precaution Comments: Watch SpO2 Required Braces or Orthoses: Other Brace Other Brace: Has bilateral knee braces/compression wrap (for stability); left knee hinged brace then places elastic sleeve over brace; rt knee prefers ace wrap tightly for support Restrictions Weight Bearing Restrictions: No     Therapy/Group: Individual Therapy  Viona Gilmore 03/17/2021, 11:50 AM

## 2021-03-18 ENCOUNTER — Ambulatory Visit: Payer: Medicare HMO | Admitting: *Deleted

## 2021-03-18 LAB — CBC
HCT: 32.2 % — ABNORMAL LOW (ref 39.0–52.0)
Hemoglobin: 10.4 g/dL — ABNORMAL LOW (ref 13.0–17.0)
MCH: 32.2 pg (ref 26.0–34.0)
MCHC: 32.3 g/dL (ref 30.0–36.0)
MCV: 99.7 fL (ref 80.0–100.0)
Platelets: 122 10*3/uL — ABNORMAL LOW (ref 150–400)
RBC: 3.23 MIL/uL — ABNORMAL LOW (ref 4.22–5.81)
RDW: 16.1 % — ABNORMAL HIGH (ref 11.5–15.5)
WBC: 5.3 10*3/uL (ref 4.0–10.5)
nRBC: 0 % (ref 0.0–0.2)

## 2021-03-18 MED ORDER — GABAPENTIN 300 MG PO CAPS: 300.0000 mg | ORAL_CAPSULE | Freq: Two times a day (BID) | ORAL | 1 refills | Status: AC

## 2021-03-18 MED ORDER — FUROSEMIDE 20 MG PO TABS: 20.0000 mg | ORAL_TABLET | Freq: Every day | ORAL | 0 refills | Status: AC | PRN

## 2021-03-18 NOTE — Progress Notes (Signed)
Patient discharged off the unit with family. Patient alert and oriented x4. Idamae Schuller, LPN

## 2021-03-18 NOTE — Progress Notes (Signed)
PROGRESS NOTE   Labs reviewed Labs reviewed Hgb and plt stable   ROS:  Pt denies SOB, abd pain, CP, N/V/D, +urinary frequency, +constipation, +lower extremity weakness   Objective:   No results found. Recent Labs    03/17/21 0620 03/18/21 0510  WBC 5.6 5.3  HGB 10.8* 10.4*  HCT 33.8* 32.2*  PLT 108* 122*     Recent Labs    03/16/21 0546  NA 140  K 4.4  CL 101  CO2 33*  GLUCOSE 95  BUN 14  CREATININE 1.29*  CALCIUM 8.9      Intake/Output Summary (Last 24 hours) at 03/18/2021 1124 Last data filed at 03/18/2021 7893 Gross per 24 hour  Intake 690 ml  Output 125 ml  Net 565 ml      Pressure Injury 03/02/21 Sacrum Mid Stage 2 -  Partial thickness loss of dermis presenting as a shallow open injury with a red, pink wound bed without slough. Small circular area in crack (Active)  03/02/21 1720  Location: Sacrum  Location Orientation: Mid  Staging: Stage 2 -  Partial thickness loss of dermis presenting as a shallow open injury with a red, pink wound bed without slough.  Wound Description (Comments): Small circular area in crack  Present on Admission: Yes    Physical Exam: Vital Signs Blood pressure (!) 135/95, pulse 60, temperature (!) 97.5 F (36.4 C), temperature source Oral, resp. rate 20, height 6\' 2"  (1.88 m), weight (!) (P) 168.9 kg, SpO2 94 %.  General: No acute distress Mood and affect are appropriate Heart: Regular rate and rhythm no rubs murmurs or extra sounds Lungs: Clear to auscultation, breathing unlabored, no rales or wheezes Abdomen: Positive bowel sounds, soft nontender to palpation, nondistended Extremities: 2+ edema bilateral pretibial and feet, is able to wear gym shoe  Skin: BLE with kerlex single layer feet to knees, no weeping, stasis dermatitis changes seen Neurological:    Mental Status: He is alert. 5/5 strenght in upper extremities, 3/5 in proximal LE, 2/5 LE DF     Assessment/Plan: 1. Functional deficits due to CIM Stable for D/C today F/u PCP in 3-4 weeks F/u PM&R 2 weeks See D/C summary See D/C instructions   Care Tool:  Bathing    Body parts bathed by patient: Right arm, Left arm, Chest, Abdomen, Face, Right upper leg, Left upper leg, Front perineal area, Right lower leg, Left lower leg   Body parts bathed by helper: Buttocks Body parts n/a: Buttocks (Pt's PLOF involves using bidet for cleaning buttocks, using AE, or receiveing help from wife.)   Bathing assist Assist Level: Minimal Assistance - Patient > 75% (Min A as long as pt has AE)     Upper Body Dressing/Undressing Upper body dressing Upper body dressing/undressing activity did not occur (including orthotics): N/A What is the patient wearing?: Hospital gown only    Upper body assist Assist Level: Set up assist Assistive Device Comment: Pt declining practicing personal clothing while in CIR.  Lower Body Dressing/Undressing Lower body dressing    Lower body dressing activity did not occur: N/A What is the patient wearing?: Incontinence brief (bilateral knee braces)     Lower body  assist Assist for lower body dressing: Total Assistance - Patient < 25% Assistive Device Comment: Set up A for donning L knee brace, dependent to don R knee ace wrap, Total A to don incontinence brief, Pt reports total A PLOF to don pants, ~mod A to doff   Toileting Toileting Toileting Activity did not occur (Clothing management and hygiene only): N/A (no void or bm)  Toileting assist Assist for toileting: Total Assistance - Patient < 25%     Transfers Chair/bed transfer  Transfers assist  Chair/bed transfer activity did not occur: Safety/medical concerns  Chair/bed transfer assist level: Contact Guard/Touching assist     Locomotion Ambulation   Ambulation assist   Ambulation activity did not occur: Safety/medical concerns  Assist level: Contact Guard/Touching assist Assistive  device: Walker-platform Max distance: 200'   Walk 10 feet activity   Assist  Walk 10 feet activity did not occur: Safety/medical concerns  Assist level: Contact Guard/Touching assist Assistive device: Walker-platform   Walk 50 feet activity   Assist Walk 50 feet with 2 turns activity did not occur: Safety/medical concerns  Assist level: Contact Guard/Touching assist Assistive device: Walker-platform    Walk 150 feet activity   Assist Walk 150 feet activity did not occur: Safety/medical concerns  Assist level: Contact Guard/Touching assist Assistive device: Walker-platform    Walk 10 feet on uneven surface  activity   Assist Walk 10 feet on uneven surfaces activity did not occur: Safety/medical concerns         Wheelchair     Assist Will patient use wheelchair at discharge?: Yes Type of Wheelchair: Power Wheelchair activity did not occur: Safety/medical concerns  Wheelchair assist level: Independent Max wheelchair distance: 200'    Wheelchair 50 feet with 2 turns activity    Assist    Wheelchair 50 feet with 2 turns activity did not occur: Safety/medical concerns   Assist Level: Independent   Wheelchair 150 feet activity     Assist  Wheelchair 150 feet activity did not occur: Safety/medical concerns   Assist Level: Independent   Blood pressure (!) 135/95, pulse 60, temperature (!) 97.5 F (36.4 C), temperature source Oral, resp. rate 20, height 6\' 2"  (1.88 m), weight (!) (P) 168.9 kg, SpO2 94 %.  Medical Problem List and Plan: 1.  Cardiopulmonary debility with ICU myopathy/neuropathy - his strength is MUCH weaker than expected for just debility- His DF's are 2/5 at most-             -patient may shower   Continue CIR- PT, OT and SLP, plan D/C 6/29 needs bariatric equipment delivered , has O2 concentrator at home used at hs PTA 2.  Antithrombotics: -DVT/anticoagulation:  Eliquis bid             -antiplatelet therapy: N/A 3. Pain  Management: N/A 4. Mood: LCSW to follow for evaluation and support.              -antipsychotic agents: N/A 5. Neuropsych: This patient is capable of making decisions on his own behalf. 6. Skin/Wound Care: Routine pressure relief measures.  7. Fluids/Electrolytes/Nutrition: Strict I/O. Electrolytes stable.  8. CAD/Chronic diastolic CHF: Monitor for signs of overload and check weight daily. --1500 cc FR/day. 6/26: weight decreased today  Filed Weights   03/16/21 0621 03/17/21 0527 03/18/21 0500  Weight: (!) 169.5 kg (!) 168.2 kg (!) (P) 168.9 kg   9. COPD: Continue Breo and Incruse daily. Wean O2 as tolerated. Goal sats 88-92%, currently 92. Breathing well.  10. OHS/OSA:  Continue BIPAP with 3 Liters  PTA, please ensure it is given at 9pm when he likes to sleep, discussed with nursing.  11. Acute on chronic renal failure:resolved  12. Stasis dermatitis: Continue local care   Continue Eucerin BID and monitor- would suggest vaseline for home care to wife.  13. Constipation: continue 1/2 dose miralax PRN 14. Urinary urgency, chronic: this is very distressing to patient, so please attend to him as soon as possible. Would benefit from briefs and q1H timed voiding while awake.    15.  Bowel incont- will hold miralax, also on colchicine but only on 1 tab daily 16. Anemia, hgb stable no signs of active bleeding f/u PCP 17. Disposition:home today  LOS: 16 days A FACE TO FACE EVALUATION WAS PERFORMED  Charlett Blake 03/18/2021, 11:24 AM

## 2021-03-18 NOTE — Progress Notes (Signed)
Patient ID: [name], male   DOB: May 04, 1941, 80 y.o.   MRN: [id number]  This SW covering for primary SW, Bekcy Dupree.   Updates that pt wife would like [facility] instead of [facility]. SW unable to get in contact with [facility] to update on changes.  SW sent HHPT/OT/SN referral to [facility] and waiting on follow-up.  Medical team requests pt to have home o2. SW ordered tank with [facility]. SW met with pt and [name] *oxygen tank delivered to pt room.   [facility] declined referral. SW waiting on updates from [facility] to see if they are still able to accept this referral.  *[facility] will keep pt with their services.  1602-SW called pt son Merry Proud [phone]) to inform on above and that pt will remain with [facility].   Loralee Pacas, MSW, [place] Office: [phone] Cell: [phone] Fax: [phone]

## 2021-03-18 NOTE — Progress Notes (Signed)
NO GUARDIAN--HE IS COMPETENT AND [employer].

## 2021-03-19 ENCOUNTER — Ambulatory Visit: Payer: Medicare HMO

## 2021-03-19 ENCOUNTER — Ambulatory Visit (INDEPENDENT_AMBULATORY_CARE_PROVIDER_SITE_OTHER): Payer: Medicare HMO | Admitting: Pharmacist

## 2021-03-19 DIAGNOSIS — I4819 Other persistent atrial fibrillation: Secondary | ICD-10-CM | POA: Diagnosis not present

## 2021-03-19 DIAGNOSIS — E7800 Pure hypercholesterolemia, unspecified: Secondary | ICD-10-CM

## 2021-03-19 DIAGNOSIS — J449 Chronic obstructive pulmonary disease, unspecified: Secondary | ICD-10-CM | POA: Diagnosis not present

## 2021-03-19 DIAGNOSIS — R609 Edema, unspecified: Secondary | ICD-10-CM

## 2021-03-19 DIAGNOSIS — N289 Disorder of kidney and ureter, unspecified: Secondary | ICD-10-CM

## 2021-03-19 DIAGNOSIS — I872 Venous insufficiency (chronic) (peripheral): Secondary | ICD-10-CM

## 2021-03-20 ENCOUNTER — Telehealth: Payer: Self-pay

## 2021-03-20 ENCOUNTER — Telehealth: Payer: Self-pay | Admitting: Family Medicine

## 2021-03-20 DIAGNOSIS — J9622 Acute and chronic respiratory failure with hypercapnia: Secondary | ICD-10-CM | POA: Diagnosis not present

## 2021-03-20 DIAGNOSIS — R0902 Hypoxemia: Secondary | ICD-10-CM

## 2021-03-20 DIAGNOSIS — G4733 Obstructive sleep apnea (adult) (pediatric): Secondary | ICD-10-CM | POA: Diagnosis not present

## 2021-03-20 DIAGNOSIS — R062 Wheezing: Secondary | ICD-10-CM | POA: Diagnosis not present

## 2021-03-20 DIAGNOSIS — J449 Chronic obstructive pulmonary disease, unspecified: Secondary | ICD-10-CM | POA: Diagnosis not present

## 2021-03-20 NOTE — Chronic Care Management (AMB) (Signed)
Chronic Care Management Pharmacy Note  03/20/2021 Name:  [name] MRN:  [id number] DOB:  [birth year]  Summary: Recent hospitalization with rehab. Patient reports feeling better. Biggest medication related need at this time is assistance with cost of Trelegy and Eliquis while in Medicare coverage gap.  Recommendations/Changes made from today's visit: Started applications for Trelegy and Eliquis PAP. Patient to pick up at appt 03/24/21. Will coordinate with specialists office to complete application process.   Plan: F/U in 2 weeks to see if applications completed and f/u COPD and edema.   Subjective: [name] is an 79 y.o. year old male who is a primary patient of [name], MD.  The CCM team was consulted for assistance with disease management and care coordination needs.    Engaged with patient by telephone for follow up visit in response to provider referral for pharmacy case management and/or care coordination services.   Consent to Services:  The patient was given information about Chronic Care Management services, agreed to services, and gave verbal consent prior to initiation of services.  Please see initial visit note for detailed documentation.   Patient Care Team: [name], MD as PCP - General (Family Medicine) Constance Haw, MD as PCP - Electrophysiology (Cardiology) Josue Hector, MD as PCP - Cardiology (Cardiology) Josue Hector, MD as Consulting Physician (Cardiology) [name] Hughs, MD as Attending Physician (Urology) Cira Rue, RN Nurse Navigator as Registered Nurse (Medical Oncology) Cherre Robins, PharmD (Pharmacist)  Recent office visits: 01/13/2021 - PCP (Dr Charlett Blake) F/U chronic conditions. No med changes noted.  04/07/022 - video visit for COPD exacerbation - prescrived ceterizien 55m daily and doxycycline 1058mbid  Recent consult visits: 02/03/2021 - cardio (WJuleen ChinaRN) Pacemaker device check 01/27/2021 -  podiatry (Dr GaElisha Ponderf/u risk foot care; nail debridement completed.   Hospital visits: Medication Reconciliation was completed by comparing discharge summary, patient's EMR and Pharmacy list, and upon discussion with patient.  Admitted to the hospital on 02/15/2021 due to decompensated respiratory failure, pneumonia; plural effusion with thoracentesis during stay. Discharge date was 03/02/2021. [facility] -[facility] none  Medication Changes at Hospital Discharge: -Changed furosemide 206maily as needed  Medications Discontinued at Hospital Discharge: -Stopped Ceterizine; Hematinic; CoEnzyme Q10  Medications that remain the same after Hospital Discharge:??  -All other medications will remain the same.    Rehab from 03/02/2021 to 03/18/2021  Objective:  Lab Results  Component Value Date   CREATININE 1.29 (H) 03/16/2021   CREATININE 1.27 (H) 03/09/2021   CREATININE 1.34 (H) 03/03/2021    Lab Results  Component Value Date   HGBA1C 5.6 10/09/2020   Last diabetic Eye exam: No results found for: HMDIABEYEEXA  Last diabetic Foot exam: No results found for: HMDIABFOOTEX      Component Value Date/Time   CHOL 116 01/09/2021 1045   CHOL 140 08/22/2018 0951   TRIG 153.0 (H) 01/09/2021 1045   HDL 27.90 (L) 01/09/2021 1045   HDL 30 (L) 08/22/2018 0951   CHOLHDL 4 01/09/2021 1045   VLDL 30.6 01/09/2021 1045   LDLCALC 57 01/09/2021 1045   LDLCALC 69 07/08/2020 1323   LDLDIRECT 94.0 10/09/2020 1337    Hepatic Function Latest Ref Rng & Units 03/03/2021 02/15/2021 02/12/2021  Total Protein 6.5 - 8.1 g/dL 5.3(L) 5.3(L) 6.3(L)  Albumin 3.5 - 5.0 g/dL 2.5(L) 2.8(L) 4.0  AST 15 - 41 U/L 20 27 23  ALT 0 - 44 U/L _0 Alk Phosphatase 38 - 126 U/L 62 48 65  Total Bilirubin 0.3 - 1.2 mg/dL 0.4 1.3(H) 0.7  Bilirubin, Direct 0.0 - 0.3 mg/dL - - -    Lab Results  Component Value Date/Time   TSH 1.393  02/23/2021 07:52 AM   TSH 1.64 10/09/2020 01:37 PM   TSH 1.29 07/08/2020 01:23 PM    CBC Latest Ref Rng & Units 03/18/2021 03/17/2021 03/16/2021  WBC 4.0 - 10.5 K/uL 5.3 5.6 5.7  Hemoglobin 13.0 - 17.0 g/dL 10.4(L) 10.8(L) 11.4(L)  Hematocrit 39.0 - 52.0 % 32.2(L) 33.8(L) 35.9(L)  Platelets 150 - 400 K/uL 122(L) 108(L) 131(L)    Lab Results  Component Value Date/Time   VD25OH 46.4 08/22/2018 09:51 AM   VD25OH 47.9 12/06/2017 09:54 AM    Clinical ASCVD: Yes  The ASCVD Risk score Mikey Bussing DC Jr., et al., 2013) failed to calculate for the following reasons:   The valid total cholesterol range is 130 to 320 mg/dL    Other: CHADS2VASc = 4  Social History   Tobacco Use  Smoking Status Former   Pack years: 0.00   Types: Pipe   Quit date: 09/20/2006   Years since quitting: 14.5  Smokeless Tobacco Never   BP Readings from Last 3 Encounters:  03/18/21 (!) 135/95  03/02/21 118/68  02/14/21 (!) 108/53   Pulse Readings from Last 3 Encounters:  03/18/21 60  03/02/21 70  02/14/21 70   Wt Readings from Last 3 Encounters:  03/18/21 (!) (P) 372 lb 5.7 oz (168.9 kg)  02/25/21 (!) 378 lb 12 oz (171.8 kg)  02/13/21 (!) 389 lb 1.8 oz (176.5 kg)    Assessment: Review of patient past medical history, allergies, medications, health status, including review of consultants reports, laboratory and other test data, was performed as part of comprehensive evaluation and provision of chronic care management services.   SDOH:  (Social Determinants of Health) assessments and interventions performed:  SDOH Interventions    Flowsheet Row Most Recent Value  SDOH Interventions   Financial Strain Interventions Other (Comment)  [Starting process of applying for manufacturer assistance program for Eliquis and Trelegy]  Physical Activity Interventions Other (Comments)  [just released from rehab. Will be receiving PT in home.]       CCM Care Plan  Allergies  Allergen Reactions   Sulfa Antibiotics      Other reaction(s): Unknown   Coumadin [Warfarin Sodium] Other (See Comments)    States he can't be on this-bleeds out    Medications Reviewed Today     Reviewed by Cherre Robins, PharmD (Pharmacist) on 03/19/21 at 1128  Med List Status: <None>   Medication Order Taking? Sig Documenting Provider Last Dose Status Informant  acetaminophen (TYLENOL) 325 MG tablet [id number] Yes Take 1-2 tablets (325-650 mg total) by mouth every 4 (four) hours as needed for mild pain.  Patient taking differently: Take 650 mg by mouth 2 (two) times daily.   Bary Leriche, PA-C Taking Active   albuterol (VENTOLIN HFA) 108 (90 Base) MCG/ACT inhaler [id number] Yes TAKE 2 PUFFS BY MOUTH EVERY 6 HOURS AS NEEDED Midge Minium, MD Taking Active Self  allopurinol (ZYLOPRIM) 100 MG tablet [id number] Yes Take 1 tablet (100 mg total) by mouth daily. [name], MD Taking Active Self  ammonium lactate (LAC-HYDRIN) 12 % lotion [id number] Yes Apply topically 2 (two) times daily. Apply to BLE twice a day and cover with light dressing. Wash off every  morning and reply lotion Love, Ivan Anchors, PA-C Taking Active   atorvastatin (LIPITOR) 80 MG tablet [id number] Yes 1 tablet by mouth daily [name], MD Taking Active Self  Cholecalciferol (VITAMIN D3) 125 MCG (5000 UT) CAPS [id number] Yes Take 1,000 Units by mouth daily. [provider] Taking Active Self  ELIQUIS 5 MG TABS tablet [id number] Yes TAKE 1 TABLET BY MOUTH TWICE A DAY Josue Hector, MD Taking Active   famotidine (PEPCID) 40 MG tablet [id number] Yes TAKE 1 TABLET BY MOUTH EVERY DAY [name], MD Taking Active   Fluticasone-Umeclidin-Vilant (TRELEGY ELLIPTA) 100-62.5-25 MCG/INH AEPB [id number] Yes Inhale 1 puff into the lungs daily. Deneise Lever, MD Taking Active Self  furosemide (LASIX) 20 MG tablet [id number] Yes Take 1 tablet (20 mg total) by mouth daily as needed for fluid or edema (for weight gain >3 lbs in one day or 5 lbs in one week). Bary Leriche, PA-C Taking Active   gabapentin (NEURONTIN) 300 MG capsule [id number] Yes Take 1 capsule (300 mg total) by mouth 2 (two) times daily. Bary Leriche, PA-C Taking Active   Misc Natural Products (TART CHERRY ADVANCED PO) [id number] Yes Take 1,200 mg by mouth 2 (two) times a day.  [provider] Taking Active Self  Multiple Vitamins-Minerals (CENTRUM SILVER PO) [id number] Yes Take 1 tablet by mouth daily. [provider] Taking Active Self  phenazopyridine (AZO-STANDARD) 95 MG tablet [id number] Yes Take 1 tablet (95 mg total) by mouth 3 (three) times daily as needed.  Patient taking differently: Take 95 mg by mouth 2 (two) times daily.   Bary Leriche, PA-C Taking Active   polyethylene glycol (MIRALAX / GLYCOLAX) 17 g packet [id number] Yes Take 17 g by mouth daily as needed. [place] Donnamarie Poag, DO Taking Active   vitamin C (ASCORBIC ACID) 500 MG tablet [id number] Yes Take 500 mg by mouth daily. [provider] Taking Active Self            Patient Active Problem List   Diagnosis Date Noted   Intensive care (ICU) myopathy 03/05/2021   Pressure injury of skin 03/02/2021   Debility 03/02/2021   Cervical stenosis of spine    Spinal cord myoclonus    CKD stage 3 secondary to diabetes ([place]) 02/15/2021   Obesity hypoventilation syndrome ([place]) 02/15/2021   Acute on chronic respiratory failure with hypoxemia ([place]) 02/15/2021   Respiratory failure ([place]) 02/12/2021   Acute on chronic respiratory failure with hypoxia and hypercapnia ([place]) 02/12/2021   Abnormal albumin 06/15/2020   Proteins serum plasma low 06/02/2020   Injury of toe on right foot 05/20/2020   Chronic respiratory failure with hypoxia ([place]) 02/26/2020   Coagulation defect ([place]) 12/07/2019   Malignant neoplasm of prostate ([place]) 05/15/2019   Pain due to onychomycosis of toenails of both feet 03/13/2019   PVD (peripheral vascular disease) ([place]) 03/13/2019   Prostate cancer ([place]) 02/13/2019   Urinary  tract infection 12/16/2018   Persistent atrial fibrillation ([place]) 10/17/2018   Primary osteoarthritis of left shoulder 08/23/2018   Renal insufficiency 06/29/2018   Bradycardia 04/26/2018   Other fatigue 12/06/2017   Shortness of breath on exertion 12/06/2017   Essential hypertension 12/06/2017   Hyperglycemia 07/28/2017   Neck pain on left side 01/25/2017   Low back pain 01/25/2017   Constipation 08/15/2015   Severe aortic valve stenosis    Medicare annual wellness visit, subsequent 03/09/2015   Preventative health care 03/09/2015   Allergic rhinitis 12/02/2014  COPD mixed type ([place]) 12/02/2014   Chronic venous insufficiency 10/02/2013   Venous stasis dermatitis 10/02/2013   AVM (arteriovenous malformation) of colon with hemorrhage 02/10/2013   Thrombocytopenia, unspecified ([place]) 02/09/2013   EDEMA 04/15/2010   HYPERCHOLESTEROLEMIA 04/14/2010   Gout 04/14/2010   Macrocytic anemia 04/14/2010   Aortic valve disorder 04/14/2010   VALVULAR HEART DISEASE 04/14/2010   Osteoarthritis 04/14/2010   Multilevel degenerative disc disease 04/14/2010   BURSITIS 04/14/2010   Obesity, Class III, BMI 40-49.9 (morbid obesity) ([place]) 03/21/2010   Obstructive sleep apnea 02/19/2008    Immunization History  Administered Date(s) Administered   Influenza Split 07/08/2011, 06/20/2012   Influenza, High Dose Seasonal PF 06/22/2016, 07/06/2017, 06/09/2018, 05/02/2019, 05/30/2020   Influenza,inj,Quad PF,6+ Mos 06/07/2013, 07/29/2014, 07/18/2015   Influenza-Unspecified 06/26/2017   Moderna SARS-COV2 Booster Vaccination 07/28/2020   Moderna Sars-Covid-2 Vaccination 10/27/2019, 11/19/2019   PPD Test 02/16/2013   Pneumococcal Conjugate-13 10/11/2013   Pneumococcal Polysaccharide-23 11/07/2015   Td 08/26/2006, 03/28/2018   Zoster Recombinat (Shingrix) 11/08/2018, 03/25/2019   Zoster, Live 07/27/2013    Conditions to be addressed/monitored: Atrial Fibrillation, HTN, HLD, COPD, CKD Stage 3, and  neuropathy; h/o prostate cancer; venous stasis;   Care Plan : General Pharmacy (Adult)  Updates made by Cherre Robins, PHARMD since 03/20/2021 12:00 AM     Problem: HTN; COPD; HDL; Afib; PVD: venous stasis; neuropathy; h/o prostate cancer; constipation   Priority: High     Goal: Long Range pharmacy goals regarding chronic care and medication management   Start Date: 03/19/2021  This Visit's Progress: On track  Priority: High  Note:   Current Barriers:  Unable to independently afford treatment regimen Unable to achieve control of venous stasis / edema  Unable to maintain control of COPD  Pharmacist Clinical Goal(s):  Over the next 90 days, patient will verbalize ability to afford treatment regimen achieve control of edema and COPD as evidenced by decreased swelling and exacerbations of COPD maintain control of HTN and HDL as evidenced by maintaining goals listed below  adhere to prescribed medication regimen as evidenced by fill history / pill count  through collaboration with PharmD and provider.   Interventions: 1:1 collaboration with [name], MD regarding development and update of comprehensive plan of care as evidenced by provider attestation and co-signature Inter-disciplinary care team collaboration (see longitudinal plan of care) Comprehensive medication review performed; medication list updated in electronic medical record  Hyperlipidemia Controlled; LDL goal < 70 Recent increase in atorvastatin from 33m daily to 843mdaily  Current regimen:  Atorvastatin 8046make 1 tablet daily Interventions: Discussed LDL goal  Maintain cholesterol medication regimen.   Atrial Fibrillation:  Goal: prevent stroke  Patient reports taking Eliquis daily Denies s/s of bleeding States he is now in Medicare coverage gap and Eliquis cost increased to >$100 per month Current regimen:  Eliquis 5mg87mke 1 tablet twice a day Interventions: Initiated process for applying for patient  assistance for Eliquis since in Medicare coverage gap. Filled out application for patient. He will pick up from our office 03/24/2021. Will coordinate with cardio office for signing. [facility]22 [facility]  Continue current therapy for atrial fibrillation / stroke preventions  Chronic venous stasis / edema in lower extremities:  Goal: decrease swelling in lower extremities and prevent infection and rehospitalization Improving edema per patient Current regimen:  Furosemide 20mg26mly as needed Ammonium lactate apply twice a day to lower legs Interventions: Discussed weighing daily and how to take furosemide; instructed to  call office if weight increases by more than 3 lbs in 24 hours or 5lbs in 1 week Home health to supply patient with scales - has upcoming visit Continue current therapy   COPD Goal: reduce symptoms associated with COPD Recent hospitalization for decompensated respiratory failure, pneumonia and plural effusion that required extensive hospital stay and rehab Current regimen:  Trelegy 1 puff daily Ventolin HFA 2 puffs every 6 hours as needed  Montelukast 46m daily Supplemental oxygen 2L during day and 3L at night Flutter valve - use 4 times daily Interventions: Discussed utility of rescue inhaler like Ventolin. Continue to use Ventolin / albuterol inhaler as needed for shortness of breath and wheezing (rescue inhaler)  Started process for applying for patient assistance for Trelegy since in Medicare coverage gap. He will pick up from our office 03/24/2021. Will coordinate with cardio office for signing. [facility]ed [facility].  Continue to use Trelegy every day (maintenance inhaler)  Medication management [facility] [place] Interventions Comprehensive medication review performed. Continue current medication management strategy Education provided to patient regarding medications, indications and appropriate  adminstraiton  Patient Goals/Self-Care Activities Over the next 90 days, patient will:  take medications as prescribed, weigh daily, and contact provider if weight gain of more than 3 lbs in 24 hours or 5 lbs in 1 week, and collaborate with provider on medication access solutions  Follow Up Plan: Telephone follow up appointment with care management team member scheduled for:  2 weeks to check on PAP and reassess edema / COPD        Medication Assistance: Application for Eliquis and Trelegy  medication assistance program. in process.  Anticipated assistance start date 04/20/2021.  See plan of care for additional detail.  Patient's preferred pharmacy is:  [id number] [place] [place] - [address] [place] [id number]: [phone]: [phone] Follow Up:  Patient agrees to Care Plan and Follow-up.  Plan: Telephone follow up appointment with care management team member scheduled for:  14 days  TaCherre RobinsPharmD Clinical Pharmacist [phone]

## 2021-03-20 NOTE — Telephone Encounter (Signed)
Spoke with patient and he was discharged with O2. He is on 2 liters.  He is requesting the [employer] portable oxygen concentrator and also a 67ft O2 hose to hook up to tank.  Are you ok with order?  If so I have pended order.

## 2021-03-20 NOTE — Telephone Encounter (Signed)
Transition Care Management Follow-up Telephone Call Date of discharge and from where: 03/17/21-[facility] How have you been since you were released from the hospital? Doing good Any questions or concerns? No  Items Reviewed: Did the pt receive and understand the discharge instructions provided? Yes  Medications obtained and verified? Yes  Other? Yes  Any new allergies since your discharge? No  Dietary orders reviewed? Yes Do you have support at home? Yes   Home Care and Equipment/Supplies: Were home health services ordered? no If so, what is the name of the agency? N/a  Has the agency set up a time to come to the patient's home? not applicable Were any new equipment or medical supplies ordered?  No What is the name of the medical supply agency? N/a Were you able to get the supplies/equipment? not applicable   Functional Questionnaire: (I = Independent and D = Dependent) ADLs: I  Bathing/Dressing- I  Meal Prep- I With assistance  Eating- I  Maintaining continence- I  Transferring/Ambulation- I with walker  Managing Meds- I  Follow up appointments reviewed:  PCP Hospital f/u appt confirmed? Yes  Scheduled to see Dr. Etter Sjogren on 03/24/21 @ 11:20. [facility] f/u appt confirmed?  N/a   Are transportation arrangements needed? No  If their condition worsens, is the pt aware to call PCP or go to the Emergency Dept.? Yes Was the patient provided with contact information for the PCP's office or ED? Yes Was to pt encouraged to call back with questions or concerns? Yes

## 2021-03-20 NOTE — Telephone Encounter (Signed)
Patient is requesting an oxygen tank  portable oxygen concentrator  [employer]ogen. He wants the one that is like a cross body ladies purse. And [employer] carries it.

## 2021-03-20 NOTE — Telephone Encounter (Signed)
Spoke with patient and he do Tuesday 4pm.  Patient scheduled.

## 2021-03-20 NOTE — Telephone Encounter (Signed)
This SW covering for primary SW, [name]  SW spoke with Adult nurse rep who informed SW pt current [facility] is not in network, and a referral will need to sent to to another agency. SW informed will f/u with [name]/[facility] liaison to discuss further. She also reported she had to explain in length the difference between home health for therapies and private sitter service.   *SW received updates from [name] that she was informed by branch intake coordinator pt policy not in network, and referral was sent to [facility] who will accept referral.   SW left message for pt son [name] [phone]) on above, and provided number for branch.  SW called and spoke with pt wife [name] [phone]) to provide updates. She states she has early dementia and her son [name] was there. SW explained above to pt son [name]. He asked when services would begin. SW explained typically 2-3 days to schedule the home visit. He reported how he had taken the last three days off. SW explained how home health will only be in the home to provide therapies and nursing. SW informed follow-up should take place atleast by Tuesday since this is a holiday weekend.   Case closed to SW.  Loralee Pacas, MSW, [place] Office: [phone] Cell: [phone] Fax: [phone]

## 2021-03-20 NOTE — Telephone Encounter (Signed)
Patient son [name] already got the cord (hose).  Order placed for the concentrator.

## 2021-03-20 NOTE — Patient Instructions (Signed)
Visit Information  PATIENT GOALS:  Goals Addressed             This Visit's Progress    Chronic Care Management Pharmacy Care Plan   On track    CARE PLAN ENTRY (see longitudinal plan of care for additional care plan information)  Current Barriers:  Chronic Disease Management support, education, and care coordination needs related to Hyperlipidemia, AFib, COPD, Gout, Osteoarthritis/Neuropathy, Edema   Hyperlipidemia Lab Results  Component Value Date/Time   LDLCALC 57 01/09/2021 10:45 AM   LDLCALC 69 07/08/2020 01:23 PM   LDLDIRECT 94.0 10/09/2020 01:37 PM  Pharmacist Clinical Goal(s): Over the next 180 days, patient will work with PharmD and providers to maintain LDL goal < 70 Current regimen:  Atorvastatin 80mg  take 1 tablet daily Interventions: Discussed LDL goal  Patient self care activities - Over the next 180 days, patient will: Maintain cholesterol medication regimen.   Atrial Fibrillation:  Pharmacist Clinical Goal(s): Over the next 180 days, patient will work with PharmD and providers to prevent stroke  Current regimen:  Eliquis 5mg  take 1 tablet twice a day Interventions: Starting process for applying for patient assistance for Eliquis since in Medicare coverage gap Patient self care activities - Over the next 180 days, patient will: Pick up application for Eliquis patient assistance when in office 03/24/2021; complete and return to clinical pharmacist at [facility] (Dr Frederik Pear office)  Continue current therapy for atrial fibrillation / stroke preventions  Chronic venous stasis / edema (swelling) in lower extremities:  Pharmacist Clinical Goal(s): Over the next 180 days, patient will work with PharmD and providers to decrease swelling in lower extremities and prevent infection and rehospitalization Current regimen:  Furosemide 20mg  daily as needed Ammonium lactate apply twice a day to lower legs Interventions: Discussed weighing daily and how to  take furosemide Patient self care activities - Over the next 180 days, patient will: Home health to supply patient with scales - has upcoming visit Weight daily; call office if weight increases by more than 3 lbs in 24 hours or 5lbs in 1 weeks Continue current therapy   COPD Pharmacist Clinical Goal(s) Over the next 180 days, patient will work with PharmD and providers to reduce symptoms associated with COPD Current regimen:  Trelegy 1 puff daily Ventolin HFA 2 puffs every 6 hours as needed  Montelukast 10mg  daily Supplemental oxygen 2L during day and 3L at night Interventions: Discussed utility of rescue inhaler like Ventolin Starting process for applying for patient assistance for Trelegy since in Medicare coverage gap Patient self care activities - Over the next 180 days, patient will: Continue to use Trelegy every day (maintenance inhaler) Continue to use Ventolin / albuterol inhaler as needed for shortness of breath and wheezing (rescue inhaler)   Medication management Pharmacist Clinical Goal(s): Over the next 180 days, patient will work with PharmD and providers to maintain optimal medication adherence [facility] Interventions Comprehensive medication review performed. Continue current medication management strategy Patient self care activities - Over the next 180 days, patient will: Focus on medication adherence by filling and taking medications appropriately  Take medications as prescribed Report any questions or concerns to PharmD and/or provider(s)  Please see past updates related to this goal by clicking on the "Past Updates" button in the selected goal           Patient verbalizes understanding of instructions provided today and agrees to view in [place].   Telephone follow up appointment with care management team member  scheduled for: 14 days  Cherre Robins, PharmD Clinical Pharmacist [place] [phone]

## 2021-03-21 DIAGNOSIS — J449 Chronic obstructive pulmonary disease, unspecified: Secondary | ICD-10-CM | POA: Diagnosis not present

## 2021-03-21 DIAGNOSIS — J9622 Acute and chronic respiratory failure with hypercapnia: Secondary | ICD-10-CM | POA: Diagnosis not present

## 2021-03-21 DIAGNOSIS — R062 Wheezing: Secondary | ICD-10-CM | POA: Diagnosis not present

## 2021-03-21 DIAGNOSIS — G4733 Obstructive sleep apnea (adult) (pediatric): Secondary | ICD-10-CM | POA: Diagnosis not present

## 2021-03-24 ENCOUNTER — Telehealth: Payer: Self-pay | Admitting: Family Medicine

## 2021-03-24 ENCOUNTER — Inpatient Hospital Stay: Payer: Medicare HMO | Admitting: Family Medicine

## 2021-03-24 ENCOUNTER — Other Ambulatory Visit: Payer: Self-pay

## 2021-03-24 ENCOUNTER — Ambulatory Visit (INDEPENDENT_AMBULATORY_CARE_PROVIDER_SITE_OTHER): Payer: Medicare HMO | Admitting: Family Medicine

## 2021-03-24 VITALS — BP 147/78 | HR 68 | Temp 98.2°F

## 2021-03-24 DIAGNOSIS — R5381 Other malaise: Secondary | ICD-10-CM | POA: Diagnosis not present

## 2021-03-24 DIAGNOSIS — D649 Anemia, unspecified: Secondary | ICD-10-CM | POA: Diagnosis not present

## 2021-03-24 DIAGNOSIS — N183 Chronic kidney disease, stage 3 unspecified: Secondary | ICD-10-CM

## 2021-03-24 DIAGNOSIS — R770 Abnormality of albumin: Secondary | ICD-10-CM | POA: Diagnosis not present

## 2021-03-24 DIAGNOSIS — E1122 Type 2 diabetes mellitus with diabetic chronic kidney disease: Secondary | ICD-10-CM

## 2021-03-24 DIAGNOSIS — R739 Hyperglycemia, unspecified: Secondary | ICD-10-CM | POA: Diagnosis not present

## 2021-03-24 DIAGNOSIS — B379 Candidiasis, unspecified: Secondary | ICD-10-CM

## 2021-03-24 NOTE — Telephone Encounter (Signed)
Patient stated blyth needed to see him 4-6 weeks VV  There are no openings   Please advise & call patient to get them an appt

## 2021-03-24 NOTE — Progress Notes (Signed)
Patient ID: [name], male    DOB: 1941/01/18  Age: 80 y.o. MRN: [id number]    Subjective:  Subjective  HPI [name] [facility] presents for office visit today for follow up on SOB and COPD. He expresses the desire to get back to walking again. However, he has been having trouble getting out of bed to start walking, despite the help he gets from his son to get up. He expresses interest in getting a bed that would accommodate his condition. He states that his current bed is too high for him to safety get out of it. He denies CP/palp/HA/congestion/fevers/GI or GU c/o. Taking meds as prescribed.  His SOB has not improved since ED visit. He states that when he is sitting on chair, he does not have to be on O2 tank. He endorses using a pulse oximeter and reports readings of 79%-80% when sitting on his chair and when sitting up it goes up to 90%.  Has had a yeast infection in pelvic region.  Review of Systems  Constitutional:  Negative for chills, fatigue and fever.  HENT:  Negative for congestion, rhinorrhea, sinus pressure, sinus pain and sore throat.   Eyes:  Negative for pain.  Respiratory:  Positive for shortness of breath. Negative for cough.   Cardiovascular:  Negative for chest pain, palpitations and leg swelling.  Gastrointestinal:  Negative for abdominal pain, blood in stool, diarrhea, nausea and vomiting.  Genitourinary:  Negative for flank pain, frequency and penile pain.  Musculoskeletal:  Negative for back pain.  Neurological:  Negative for headaches.   History Past Medical History:  Diagnosis Date   1st degree AV block 11/09/2018   Noted on EKG    Anemia    Aortic stenosis, severe    S/p Edwards Sapien 3 Transcatheter Heart Valve (size 26 mm, model # [id number], serial # [id number])   Arthritis    Back pain    Bursitis    Cataract    left immature   Cellulitis 10/12/2015   CHF (congestive heart failure) (HCC)    Complication of anesthesia    Halucinations    Constipation    takes Miralax daily as well as Senokot daily   DDD (degenerative disc disease)    Gout    takes Allopurinol daily   Heart valve disorder    History of blood clots 1962   knee   History of blood transfusion    no abnormal reaction noted   History of shingles    Hyperlipidemia    takes Atorvastatin daily   Hypertension    takes Lisinopril daily   Low back pain 01/25/2017   Morbid obesity (HCC)    OSA (obstructive sleep apnea)    Osteoarthritis    Peripheral neuropathy    takes Gabapentin daily   Peroneal palsy    significant right foot drop   Persistent atrial fibrillation ([place])    Pneumonia 25+yrs ago   hx of   Prostate cancer ([place]) 02/13/2019   RBBB 11/09/2018   Noted on EKG   Thrombocytopenia ([place])    Urinary frequency    Urinary urgency    Valvular heart disease    Venous stasis dermatitis     He has a past surgical history that includes Total knee arthroplasty; Spine surgery; Laminotomy (4098); Hernia repair; Knee surgery (Right); Colonoscopy (N/A, 02/07/2013); Esophagogastroduodenoscopy (N/A, 02/07/2013); Colonoscopy (N/A, 02/09/2013); Givens capsule study (N/A, 02/09/2013); TEE without cardioversion (N/A, 03/07/2015); Cardiac catheterization (N/A, 05/02/2015); Transcatheter aortic valve replacement, transfemoral (Left, 06/10/2015);  TEE without cardioversion (N/A, 06/10/2015); PACEMAKER IMPLANT (N/A, 04/27/2018); Cardioversion (N/A, 10/27/2018); Prostate biopsy (N/A, 04/12/2019); and IR THORACENTESIS ASP PLEURAL SPACE W/IMG GUIDE (02/18/2021).   His family history includes Arthritis in his sister and sister; Other in his father; Prostate cancer in his brother.He reports that he quit smoking about 14 years ago. His smoking use included pipe. He has never used smokeless tobacco. He reports that he does not drink alcohol and does not use drugs.  Current Outpatient Medications on File Prior to Visit  Medication Sig Dispense Refill   acetaminophen (TYLENOL) 325 MG tablet Take  1-2 tablets (325-650 mg total) by mouth every 4 (four) hours as needed for mild pain. (Patient taking differently: Take 650 mg by mouth 2 (two) times daily.)     albuterol (VENTOLIN HFA) 108 (90 Base) MCG/ACT inhaler TAKE 2 PUFFS BY MOUTH EVERY 6 HOURS AS NEEDED 18 each 1   allopurinol (ZYLOPRIM) 100 MG tablet Take 1 tablet (100 mg total) by mouth daily. 90 tablet 3   atorvastatin (LIPITOR) 80 MG tablet 1 tablet by mouth daily 90 tablet 1   Cholecalciferol (VITAMIN D3) 125 MCG (5000 UT) CAPS Take 1,000 Units by mouth daily.     ELIQUIS 5 MG TABS tablet TAKE 1 TABLET BY MOUTH TWICE A DAY 60 tablet 6   famotidine (PEPCID) 40 MG tablet TAKE 1 TABLET BY MOUTH EVERY DAY 90 tablet 1   Fluticasone-Umeclidin-Vilant (TRELEGY ELLIPTA) 100-62.5-25 MCG/INH AEPB Inhale 1 puff into the lungs daily. 60 each 12   furosemide (LASIX) 20 MG tablet Take 1 tablet (20 mg total) by mouth daily as needed for fluid or edema (for weight gain >3 lbs in one day or 5 lbs in one week). 30 tablet 0   gabapentin (NEURONTIN) 300 MG capsule Take 1 capsule (300 mg total) by mouth 2 (two) times daily. 270 capsule 1   Misc Natural Products (TART CHERRY ADVANCED PO) Take 1,200 mg by mouth 2 (two) times a day.      Multiple Vitamins-Minerals (CENTRUM SILVER PO) Take 1 tablet by mouth daily.     phenazopyridine (AZO-STANDARD) 95 MG tablet Take 1 tablet (95 mg total) by mouth 3 (three) times daily as needed. (Patient taking differently: Take 95 mg by mouth 2 (two) times daily.)     polyethylene glycol (MIRALAX / GLYCOLAX) 17 g packet Take 17 g by mouth daily as needed. 14 each 0   vitamin C (ASCORBIC ACID) 500 MG tablet Take 500 mg by mouth daily.     ammonium lactate (LAC-HYDRIN) 12 % lotion Apply topically 2 (two) times daily. Apply to BLE twice a day and cover with light dressing. Wash off every morning and reply lotion 400 g 0   No current facility-administered medications on file prior to visit.     Objective:  Objective   Physical Exam Constitutional:      General: He is not in acute distress.    Appearance: Normal appearance. He is not ill-appearing or toxic-appearing.  HENT:     Head: Normocephalic and atraumatic.     Right Ear: Tympanic membrane, ear canal and external ear normal.     Left Ear: Tympanic membrane, ear canal and external ear normal.     Nose: No congestion or rhinorrhea.  Eyes:     Extraocular Movements: Extraocular movements intact.     Pupils: Pupils are equal, round, and reactive to light.  Cardiovascular:     Rate and Rhythm: Normal rate and regular rhythm.  Pulses: Normal pulses.     Heart sounds: Normal heart sounds. No murmur heard. Pulmonary:     Effort: Pulmonary effort is normal. No respiratory distress.     Breath sounds: Normal breath sounds. No wheezing, rhonchi or rales.  Abdominal:     General: Bowel sounds are normal.     Palpations: Abdomen is soft. There is no mass.     Tenderness: no abdominal tenderness There is no guarding.     Hernia: No hernia is present.     Comments: -macerated red swelter and erythremic paus on left corner of abdominal region  Musculoskeletal:        General: Normal range of motion.     Cervical back: Normal range of motion and neck supple.  Skin:    General: Skin is warm and dry.  Neurological:     Mental Status: He is alert and oriented to person, place, and time.  Psychiatric:        Behavior: Behavior normal.   BP (!) 147/78   Pulse 68   Temp 98.2 F (36.8 C)   SpO2 (!) 87%  Wt Readings from Last 3 Encounters:  03/18/21 (!) (P) 372 lb 5.7 oz (168.9 kg)  02/25/21 (!) 378 lb 12 oz (171.8 kg)  02/13/21 (!) 389 lb 1.8 oz (176.5 kg)     Lab Results  Component Value Date   WBC 7.0 03/24/2021   HGB 10.9 (L) 03/24/2021   HCT 33.2 (L) 03/24/2021   PLT 149.0 (L) 03/24/2021   GLUCOSE 93 03/24/2021   CHOL 116 01/09/2021   TRIG 153.0 (H) 01/09/2021   HDL 27.90 (L) 01/09/2021   LDLDIRECT 94.0 10/09/2020   LDLCALC 57  01/09/2021   ALT 14 03/24/2021   AST 20 03/24/2021   NA 144 03/24/2021   K 4.6 03/24/2021   CL 100 03/24/2021   CREATININE 1.24 03/24/2021   BUN 20 03/24/2021   CO2 36 (H) 03/24/2021   TSH 1.393 02/23/2021   PSA 24.63 (H) 03/19/2019   INR 1.2 02/12/2021   HGBA1C 5.6 10/09/2020    No results found.   Assessment & Plan:  Plan    No orders of the defined types were placed in this encounter.   Problem List Items Addressed This Visit     Anemia - Primary    Improving since hospitalization. Will continue to monitor       Relevant Orders   CBC with Differential/Platelet (Completed)   Hyperglycemia    hgba1c acceptable, minimize simple carbs. Increase exercise as tolerated.        Relevant Orders   Comprehensive metabolic panel (Completed)   Abnormal albumin    Encouraged adequate protein intake       Relevant Orders   Ambulatory referral to [facility]   CKD stage 3 secondary to diabetes ([place])    Hydrate and monitor       Debility    With recent falls and worsening weakness. Was released from hospital with order for [facility] to come out and provide PT/OT and nursing but he has not sen them yet will place an order       Relevant Orders   Ambulatory referral to Home Health   Candidiasis    Under abdominal panus. Started on Diflucan 150 mg tabs, 1 tab weekly x 2 doses and Nystatin cream bid. Keep dry and clean       Relevant Orders   Ambulatory referral to Home Health    Follow-up: Return  in about 4 weeks (around 04/21/2021), or 4-6 week f/u VV.  I, Suezanne Jacquet, acting as a scribe for Penni Homans, MD, have documented all relevent documentation on behalf of Penni Homans, MD, as directed by Penni Homans, MD while in the presence of Penni Homans, MD.  I, [name], MD personally performed the services described in this documentation. All medical record entries made by the scribe were at my direction and in my presence. I have reviewed the chart and  agree that the record reflects my personal performance and is accurate and complete

## 2021-03-24 NOTE — Patient Instructions (Addendum)
Diflucan/Fluconazole for yeast do not take the Atorvastatin the night you take this  Genital Yeast Infection, Male In men, a genital yeast infection is a condition that causes soreness, swelling, and redness (inflammation) of the head of the penis (glans penis). A genital yeast infection can be spread through sexual contact, but it can also develop without sexual contact. If the infection is not treated properly,it is likely to come back. What are the causes? This condition is caused by a change in the normal balance of the yeast and bacteria that live on the skin. This change causes an overgrowth of yeast, which causes the inflammation. Many types of yeast can cause this infection, but Candida is the most common. What increases the risk? The following factors may make you more likely to develop this condition: Taking antibiotics. Having diabetes. Being exposed to the infection by a sexual partner. Being uncircumcised. Having a weak body defense system (immune system). Taking steroid medicines for a long time. Having poor hygiene. What are the signs or symptoms? Symptoms of this condition include: Itching of the groin and penis. Dry, red, or cracked skin on the penis. Swelling of the genital area. Pain while urinating or difficulty urinating. Thick, bad-smelling discharge on the penis. How is this diagnosed? This condition may be diagnosed based on: Your medical history. A physical exam. You may also have tests, such as: Test of a sample of discharge from the penis. Urine tests. Blood tests. How is this treated? This condition is treated with: Anti-fungal creams or medicines. Anti-fungal medicines may be prescribed by your health care provider or they may be available over-the-counter. Self-care at home. For men who are not circumcised, circumcision may be recommended to controlinfections that return and are difficult to treat. Follow these instructions at home: Medicines  Take  or apply over-the-counter and prescription medicines only as told by your health care provider. Take your anti-fungal medicine as told by your health care provider. Do not stop taking the medicine even if you start to feel better.  Self care Wash your penis with soap and water every day. If you are not circumcised, pull back the foreskin to wash. Make sure to dry your penis completely after washing. Wear breathable, cotton underwear. Keep your underwear clean and dry. General instructions Do not have sex until your health care provider has approved. Tell your sexual partner that you have a yeast infection. That person should go for treatment even if no symptoms are present. If you have diabetes, keep your blood sugar levels within your target range. Keep all follow-up visits as told by your health care provider. This is important. Contact a health care provider if you: Have a fever. Have symptoms that go away and then return. Do not get better with treatment. Have symptoms that get worse. Have new symptoms. Get help right away if: Your swelling and inflammation become so severe that you cannot urinate. Summary In men, a genital yeast infection is a condition that causes soreness, swelling, and redness (inflammation) of the head of the penis (glans penis). This condition is caused by a change in the normal balance of the yeast and bacteria that live on the skin. This change causes an overgrowth of yeast, which causes the inflammation. A genital yeast infection usually spreads through sexual contact, but it can develop without sexual contact. For instance, you may be more likely to develop this infection if you take antibiotics or steroids, have diabetes, are not circumcised, have a weak immune system,  or have poor hygiene. This condition is treated with anti-fungal cream or pills along with self-care at home. This information is not intended to replace advice given to you by your health care  provider. Make sure you discuss any questions you have with your healthcare provider. Document Revised: 10/11/2017 Document Reviewed: 10/11/2017 Elsevier Patient Education  2022 [place]. e day you take this each week

## 2021-03-25 DIAGNOSIS — B379 Candidiasis, unspecified: Secondary | ICD-10-CM | POA: Insufficient documentation

## 2021-03-25 LAB — CBC WITH DIFFERENTIAL/PLATELET
Basophils Absolute: 0.1 10*3/uL (ref 0.0–0.1)
Basophils Relative: 1.2 % (ref 0.0–3.0)
Eosinophils Absolute: 0.1 10*3/uL (ref 0.0–0.7)
Eosinophils Relative: 1.5 % (ref 0.0–5.0)
HCT: 33.2 % — ABNORMAL LOW (ref 39.0–52.0)
Hemoglobin: 10.9 g/dL — ABNORMAL LOW (ref 13.0–17.0)
Lymphocytes Relative: 13.1 % (ref 12.0–46.0)
Lymphs Abs: 0.9 10*3/uL (ref 0.7–4.0)
MCHC: 32.8 g/dL (ref 30.0–36.0)
MCV: 97.5 fl (ref 78.0–100.0)
Monocytes Absolute: 0.4 10*3/uL (ref 0.1–1.0)
Monocytes Relative: 6.1 % (ref 3.0–12.0)
Neutro Abs: 5.5 10*3/uL (ref 1.4–7.7)
Neutrophils Relative %: 78.1 % — ABNORMAL HIGH (ref 43.0–77.0)
Platelets: 149 10*3/uL — ABNORMAL LOW (ref 150.0–400.0)
RBC: 3.41 Mil/uL — ABNORMAL LOW (ref 4.22–5.81)
RDW: 18.6 % — ABNORMAL HIGH (ref 11.5–15.5)
WBC: 7 10*3/uL (ref 4.0–10.5)

## 2021-03-25 LAB — COMPREHENSIVE METABOLIC PANEL
ALT: 14 U/L (ref 0–53)
AST: 20 U/L (ref 0–37)
Albumin: 3.5 g/dL (ref 3.5–5.2)
Alkaline Phosphatase: 73 U/L (ref 39–117)
BUN: 20 mg/dL (ref 6–23)
CO2: 36 mEq/L — ABNORMAL HIGH (ref 19–32)
Calcium: 10 mg/dL (ref 8.4–10.5)
Chloride: 100 mEq/L (ref 96–112)
Creatinine, Ser: 1.24 mg/dL (ref 0.40–1.50)
GFR: 55.63 mL/min — ABNORMAL LOW (ref >60.00)
Glucose, Bld: 93 mg/dL (ref 70–99)
Potassium: 4.6 mEq/L (ref 3.5–5.1)
Sodium: 144 mEq/L (ref 135–145)
Total Bilirubin: 0.5 mg/dL (ref 0.2–1.2)
Total Protein: 6.2 g/dL (ref 6.0–8.3)

## 2021-03-25 NOTE — Assessment & Plan Note (Signed)
Under abdominal panus. Started on Diflucan 150 mg tabs, 1 tab weekly x 2 doses and Nystatin cream bid. Keep dry and clean

## 2021-03-25 NOTE — Assessment & Plan Note (Signed)
With recent falls and worsening weakness. Was released from hospital with order for [facility] to come out and provide PT/OT and nursing but he has not sen them yet will place an order

## 2021-03-25 NOTE — Assessment & Plan Note (Signed)
Encouraged adequate protein intake

## 2021-03-25 NOTE — Assessment & Plan Note (Signed)
Improving since hospitalization. Will continue to monitor

## 2021-03-25 NOTE — Telephone Encounter (Signed)
No available openings.  If you can we can possibly do 1pm for this patient.  Can you please review for days 8/9,8/11,8/15,8/16 or 8/18, to see if any of those days at 1pm with work for you.

## 2021-03-25 NOTE — Assessment & Plan Note (Signed)
hgba1c acceptable, minimize simple carbs. Increase exercise as tolerated.  

## 2021-03-25 NOTE — Assessment & Plan Note (Signed)
Hydrate and monitor 

## 2021-03-25 NOTE — Telephone Encounter (Signed)
Patient scheduled for 05/05/21 at 1pm

## 2021-03-26 ENCOUNTER — Other Ambulatory Visit: Payer: Self-pay | Admitting: Family Medicine

## 2021-03-26 ENCOUNTER — Telehealth: Payer: Self-pay

## 2021-03-26 MED ORDER — NYSTATIN 100000 UNIT/GM EX CREA: 1.0000 "application " | TOPICAL_CREAM | Freq: Two times a day (BID) | CUTANEOUS | 3 refills | Status: AC

## 2021-03-26 MED ORDER — FLUCONAZOLE 150 MG PO TABS: 150.0000 mg | ORAL_TABLET | ORAL | 1 refills | Status: DC

## 2021-03-26 NOTE — Telephone Encounter (Signed)
Patient called states he had a visit with Dr. Charlett Blake 03/24/2021 for follow up from ED. He was told you were going to send in a cream and oral pills for yeast infection in pelvic region. He went to pharmacy and they did not have prescriptions on file.   Dr. Charlett Blake, I do not see where any medications have been recently sent in for that. Could you advise and send in rx if appropriate. [facility] [place].    Also, he states after hospital he was advised to follow up with Dr. Naaman Plummer (Rehab) . They have contacted him to schedule appointment however he is declining as he has followed up with you -he does not want two ER follow up visits. He is asking if we could let Dr. Naaman Plummer office know we are following up with him they would not need to see him too. [phone]

## 2021-03-27 DIAGNOSIS — G7281 Critical illness myopathy: Secondary | ICD-10-CM | POA: Diagnosis not present

## 2021-03-27 DIAGNOSIS — N1831 Chronic kidney disease, stage 3a: Secondary | ICD-10-CM | POA: Diagnosis not present

## 2021-03-27 DIAGNOSIS — E662 Morbid (severe) obesity with alveolar hypoventilation: Secondary | ICD-10-CM | POA: Diagnosis not present

## 2021-03-27 DIAGNOSIS — J9621 Acute and chronic respiratory failure with hypoxia: Secondary | ICD-10-CM | POA: Diagnosis not present

## 2021-03-27 DIAGNOSIS — J449 Chronic obstructive pulmonary disease, unspecified: Secondary | ICD-10-CM | POA: Diagnosis not present

## 2021-03-27 DIAGNOSIS — Z6841 Body Mass Index (BMI) 40.0 and over, adult: Secondary | ICD-10-CM | POA: Diagnosis not present

## 2021-03-27 DIAGNOSIS — I872 Venous insufficiency (chronic) (peripheral): Secondary | ICD-10-CM | POA: Diagnosis not present

## 2021-03-27 DIAGNOSIS — M1990 Unspecified osteoarthritis, unspecified site: Secondary | ICD-10-CM | POA: Diagnosis not present

## 2021-03-27 DIAGNOSIS — E1151 Type 2 diabetes mellitus with diabetic peripheral angiopathy without gangrene: Secondary | ICD-10-CM | POA: Diagnosis not present

## 2021-03-27 DIAGNOSIS — E1122 Type 2 diabetes mellitus with diabetic chronic kidney disease: Secondary | ICD-10-CM | POA: Diagnosis not present

## 2021-03-27 NOTE — Telephone Encounter (Signed)
Patient will call Dr. Naaman Plummer office back to see what they do.  Advised that medication was sent in and they had already picked up.

## 2021-03-31 ENCOUNTER — Encounter: Payer: Medicare HMO | Admitting: Registered Nurse

## 2021-03-31 DIAGNOSIS — E1151 Type 2 diabetes mellitus with diabetic peripheral angiopathy without gangrene: Secondary | ICD-10-CM | POA: Diagnosis not present

## 2021-03-31 DIAGNOSIS — M1990 Unspecified osteoarthritis, unspecified site: Secondary | ICD-10-CM | POA: Diagnosis not present

## 2021-03-31 DIAGNOSIS — Z6841 Body Mass Index (BMI) 40.0 and over, adult: Secondary | ICD-10-CM | POA: Diagnosis not present

## 2021-03-31 DIAGNOSIS — E1122 Type 2 diabetes mellitus with diabetic chronic kidney disease: Secondary | ICD-10-CM | POA: Diagnosis not present

## 2021-03-31 DIAGNOSIS — G7281 Critical illness myopathy: Secondary | ICD-10-CM | POA: Diagnosis not present

## 2021-03-31 DIAGNOSIS — I872 Venous insufficiency (chronic) (peripheral): Secondary | ICD-10-CM | POA: Diagnosis not present

## 2021-03-31 DIAGNOSIS — J9621 Acute and chronic respiratory failure with hypoxia: Secondary | ICD-10-CM | POA: Diagnosis not present

## 2021-03-31 DIAGNOSIS — E662 Morbid (severe) obesity with alveolar hypoventilation: Secondary | ICD-10-CM | POA: Diagnosis not present

## 2021-03-31 DIAGNOSIS — N1831 Chronic kidney disease, stage 3a: Secondary | ICD-10-CM | POA: Diagnosis not present

## 2021-03-31 DIAGNOSIS — J449 Chronic obstructive pulmonary disease, unspecified: Secondary | ICD-10-CM | POA: Diagnosis not present

## 2021-04-01 DIAGNOSIS — I872 Venous insufficiency (chronic) (peripheral): Secondary | ICD-10-CM | POA: Diagnosis not present

## 2021-04-01 DIAGNOSIS — M1990 Unspecified osteoarthritis, unspecified site: Secondary | ICD-10-CM | POA: Diagnosis not present

## 2021-04-01 DIAGNOSIS — E1122 Type 2 diabetes mellitus with diabetic chronic kidney disease: Secondary | ICD-10-CM | POA: Diagnosis not present

## 2021-04-01 DIAGNOSIS — J449 Chronic obstructive pulmonary disease, unspecified: Secondary | ICD-10-CM | POA: Diagnosis not present

## 2021-04-01 DIAGNOSIS — E662 Morbid (severe) obesity with alveolar hypoventilation: Secondary | ICD-10-CM | POA: Diagnosis not present

## 2021-04-01 DIAGNOSIS — Z6841 Body Mass Index (BMI) 40.0 and over, adult: Secondary | ICD-10-CM | POA: Diagnosis not present

## 2021-04-01 DIAGNOSIS — E1151 Type 2 diabetes mellitus with diabetic peripheral angiopathy without gangrene: Secondary | ICD-10-CM | POA: Diagnosis not present

## 2021-04-01 DIAGNOSIS — G7281 Critical illness myopathy: Secondary | ICD-10-CM | POA: Diagnosis not present

## 2021-04-01 DIAGNOSIS — J9621 Acute and chronic respiratory failure with hypoxia: Secondary | ICD-10-CM | POA: Diagnosis not present

## 2021-04-01 DIAGNOSIS — N1831 Chronic kidney disease, stage 3a: Secondary | ICD-10-CM | POA: Diagnosis not present

## 2021-04-02 ENCOUNTER — Telehealth: Payer: Medicare HMO

## 2021-04-02 ENCOUNTER — Telehealth: Payer: Self-pay

## 2021-04-02 DIAGNOSIS — N1831 Chronic kidney disease, stage 3a: Secondary | ICD-10-CM | POA: Diagnosis not present

## 2021-04-02 DIAGNOSIS — E1151 Type 2 diabetes mellitus with diabetic peripheral angiopathy without gangrene: Secondary | ICD-10-CM | POA: Diagnosis not present

## 2021-04-02 DIAGNOSIS — M1990 Unspecified osteoarthritis, unspecified site: Secondary | ICD-10-CM | POA: Diagnosis not present

## 2021-04-02 DIAGNOSIS — Z6841 Body Mass Index (BMI) 40.0 and over, adult: Secondary | ICD-10-CM | POA: Diagnosis not present

## 2021-04-02 DIAGNOSIS — J449 Chronic obstructive pulmonary disease, unspecified: Secondary | ICD-10-CM | POA: Diagnosis not present

## 2021-04-02 DIAGNOSIS — I872 Venous insufficiency (chronic) (peripheral): Secondary | ICD-10-CM | POA: Diagnosis not present

## 2021-04-02 DIAGNOSIS — E662 Morbid (severe) obesity with alveolar hypoventilation: Secondary | ICD-10-CM | POA: Diagnosis not present

## 2021-04-02 DIAGNOSIS — G7281 Critical illness myopathy: Secondary | ICD-10-CM | POA: Diagnosis not present

## 2021-04-02 DIAGNOSIS — E1122 Type 2 diabetes mellitus with diabetic chronic kidney disease: Secondary | ICD-10-CM | POA: Diagnosis not present

## 2021-04-02 DIAGNOSIS — J9621 Acute and chronic respiratory failure with hypoxia: Secondary | ICD-10-CM | POA: Diagnosis not present

## 2021-04-02 NOTE — Telephone Encounter (Signed)
Pt called regarding orders for O2. He thought he discussed it with Dr. Charlett Blake at last visit. He currently has O2 but was wanting O2 he could carry around like a purse. He was concerned that his wife has almost falling over his O2 tank. I advised I did see a referral for Home Health that was pending currently. Please advise

## 2021-04-03 ENCOUNTER — Ambulatory Visit (INDEPENDENT_AMBULATORY_CARE_PROVIDER_SITE_OTHER): Payer: Medicare HMO | Admitting: Pharmacist

## 2021-04-03 ENCOUNTER — Inpatient Hospital Stay: Payer: Medicare HMO | Admitting: Registered Nurse

## 2021-04-03 DIAGNOSIS — I4819 Other persistent atrial fibrillation: Secondary | ICD-10-CM

## 2021-04-03 DIAGNOSIS — J449 Chronic obstructive pulmonary disease, unspecified: Secondary | ICD-10-CM

## 2021-04-03 DIAGNOSIS — M1990 Unspecified osteoarthritis, unspecified site: Secondary | ICD-10-CM | POA: Diagnosis not present

## 2021-04-03 DIAGNOSIS — I872 Venous insufficiency (chronic) (peripheral): Secondary | ICD-10-CM | POA: Diagnosis not present

## 2021-04-03 DIAGNOSIS — N1831 Chronic kidney disease, stage 3a: Secondary | ICD-10-CM | POA: Diagnosis not present

## 2021-04-03 DIAGNOSIS — Z6841 Body Mass Index (BMI) 40.0 and over, adult: Secondary | ICD-10-CM | POA: Diagnosis not present

## 2021-04-03 DIAGNOSIS — J9621 Acute and chronic respiratory failure with hypoxia: Secondary | ICD-10-CM | POA: Diagnosis not present

## 2021-04-03 DIAGNOSIS — E662 Morbid (severe) obesity with alveolar hypoventilation: Secondary | ICD-10-CM | POA: Diagnosis not present

## 2021-04-03 DIAGNOSIS — E7800 Pure hypercholesterolemia, unspecified: Secondary | ICD-10-CM

## 2021-04-03 DIAGNOSIS — E1122 Type 2 diabetes mellitus with diabetic chronic kidney disease: Secondary | ICD-10-CM | POA: Diagnosis not present

## 2021-04-03 DIAGNOSIS — E1151 Type 2 diabetes mellitus with diabetic peripheral angiopathy without gangrene: Secondary | ICD-10-CM | POA: Diagnosis not present

## 2021-04-03 DIAGNOSIS — G7281 Critical illness myopathy: Secondary | ICD-10-CM | POA: Diagnosis not present

## 2021-04-06 ENCOUNTER — Encounter: Payer: Medicare HMO | Admitting: Registered Nurse

## 2021-04-06 DIAGNOSIS — E1122 Type 2 diabetes mellitus with diabetic chronic kidney disease: Secondary | ICD-10-CM | POA: Diagnosis not present

## 2021-04-06 DIAGNOSIS — M1990 Unspecified osteoarthritis, unspecified site: Secondary | ICD-10-CM | POA: Diagnosis not present

## 2021-04-06 DIAGNOSIS — G7281 Critical illness myopathy: Secondary | ICD-10-CM | POA: Diagnosis not present

## 2021-04-06 DIAGNOSIS — Z6841 Body Mass Index (BMI) 40.0 and over, adult: Secondary | ICD-10-CM | POA: Diagnosis not present

## 2021-04-06 DIAGNOSIS — E1151 Type 2 diabetes mellitus with diabetic peripheral angiopathy without gangrene: Secondary | ICD-10-CM | POA: Diagnosis not present

## 2021-04-06 DIAGNOSIS — J449 Chronic obstructive pulmonary disease, unspecified: Secondary | ICD-10-CM | POA: Diagnosis not present

## 2021-04-06 DIAGNOSIS — N1831 Chronic kidney disease, stage 3a: Secondary | ICD-10-CM | POA: Diagnosis not present

## 2021-04-06 DIAGNOSIS — J9621 Acute and chronic respiratory failure with hypoxia: Secondary | ICD-10-CM | POA: Diagnosis not present

## 2021-04-06 DIAGNOSIS — I872 Venous insufficiency (chronic) (peripheral): Secondary | ICD-10-CM | POA: Diagnosis not present

## 2021-04-06 DIAGNOSIS — E662 Morbid (severe) obesity with alveolar hypoventilation: Secondary | ICD-10-CM | POA: Diagnosis not present

## 2021-04-07 DIAGNOSIS — E1122 Type 2 diabetes mellitus with diabetic chronic kidney disease: Secondary | ICD-10-CM | POA: Diagnosis not present

## 2021-04-07 DIAGNOSIS — I872 Venous insufficiency (chronic) (peripheral): Secondary | ICD-10-CM | POA: Diagnosis not present

## 2021-04-07 DIAGNOSIS — J449 Chronic obstructive pulmonary disease, unspecified: Secondary | ICD-10-CM | POA: Diagnosis not present

## 2021-04-07 DIAGNOSIS — E1151 Type 2 diabetes mellitus with diabetic peripheral angiopathy without gangrene: Secondary | ICD-10-CM | POA: Diagnosis not present

## 2021-04-07 DIAGNOSIS — N1831 Chronic kidney disease, stage 3a: Secondary | ICD-10-CM | POA: Diagnosis not present

## 2021-04-07 DIAGNOSIS — E662 Morbid (severe) obesity with alveolar hypoventilation: Secondary | ICD-10-CM | POA: Diagnosis not present

## 2021-04-07 DIAGNOSIS — M1990 Unspecified osteoarthritis, unspecified site: Secondary | ICD-10-CM | POA: Diagnosis not present

## 2021-04-07 DIAGNOSIS — J9621 Acute and chronic respiratory failure with hypoxia: Secondary | ICD-10-CM | POA: Diagnosis not present

## 2021-04-07 DIAGNOSIS — G7281 Critical illness myopathy: Secondary | ICD-10-CM | POA: Diagnosis not present

## 2021-04-07 DIAGNOSIS — Z6841 Body Mass Index (BMI) 40.0 and over, adult: Secondary | ICD-10-CM | POA: Diagnosis not present

## 2021-04-07 NOTE — Telephone Encounter (Signed)
[facility] stated per Inda Castle that they were unable to help patient because his need was too great.  They advised [name] to try [facility].  Spoke with Ronalee Belts at [facility] who stated that he will see what they can do.  He called back stating that they are out of network for patient.  Patient has a $11,300 deductable and only $3,400 of that has been met.    I'm and unsure of what next step is for patient.  Do you want to get social worker involve to help?

## 2021-04-08 ENCOUNTER — Other Ambulatory Visit: Payer: Self-pay | Admitting: Family Medicine

## 2021-04-09 ENCOUNTER — Telehealth: Payer: Self-pay | Admitting: Family Medicine

## 2021-04-09 ENCOUNTER — Other Ambulatory Visit: Payer: Self-pay | Admitting: Family Medicine

## 2021-04-09 DIAGNOSIS — E1151 Type 2 diabetes mellitus with diabetic peripheral angiopathy without gangrene: Secondary | ICD-10-CM | POA: Diagnosis not present

## 2021-04-09 DIAGNOSIS — I872 Venous insufficiency (chronic) (peripheral): Secondary | ICD-10-CM | POA: Diagnosis not present

## 2021-04-09 DIAGNOSIS — J9621 Acute and chronic respiratory failure with hypoxia: Secondary | ICD-10-CM | POA: Diagnosis not present

## 2021-04-09 DIAGNOSIS — N1831 Chronic kidney disease, stage 3a: Secondary | ICD-10-CM | POA: Diagnosis not present

## 2021-04-09 DIAGNOSIS — J449 Chronic obstructive pulmonary disease, unspecified: Secondary | ICD-10-CM | POA: Diagnosis not present

## 2021-04-09 DIAGNOSIS — E662 Morbid (severe) obesity with alveolar hypoventilation: Secondary | ICD-10-CM | POA: Diagnosis not present

## 2021-04-09 DIAGNOSIS — Z6841 Body Mass Index (BMI) 40.0 and over, adult: Secondary | ICD-10-CM | POA: Diagnosis not present

## 2021-04-09 DIAGNOSIS — M1990 Unspecified osteoarthritis, unspecified site: Secondary | ICD-10-CM | POA: Diagnosis not present

## 2021-04-09 DIAGNOSIS — E1122 Type 2 diabetes mellitus with diabetic chronic kidney disease: Secondary | ICD-10-CM | POA: Diagnosis not present

## 2021-04-09 DIAGNOSIS — G7281 Critical illness myopathy: Secondary | ICD-10-CM | POA: Diagnosis not present

## 2021-04-09 DIAGNOSIS — D649 Anemia, unspecified: Secondary | ICD-10-CM

## 2021-04-09 NOTE — Telephone Encounter (Signed)
Verbal orders given via detailed voicemail.

## 2021-04-09 NOTE — Telephone Encounter (Signed)
They d/c at hospital.  Do you want him to continue?

## 2021-04-09 NOTE — Telephone Encounter (Signed)
Caller: [facility]) Call back # [phone]  Verbal order for OT  1 time a week for 1 week 2 times a week for 3 weeks 1 time a week for 3 weeks  Ok to leave msg

## 2021-04-10 ENCOUNTER — Telehealth: Payer: Self-pay

## 2021-04-10 ENCOUNTER — Telehealth: Payer: Self-pay | Admitting: Internal Medicine

## 2021-04-10 DIAGNOSIS — E1122 Type 2 diabetes mellitus with diabetic chronic kidney disease: Secondary | ICD-10-CM | POA: Diagnosis not present

## 2021-04-10 DIAGNOSIS — Z6841 Body Mass Index (BMI) 40.0 and over, adult: Secondary | ICD-10-CM | POA: Diagnosis not present

## 2021-04-10 DIAGNOSIS — E1151 Type 2 diabetes mellitus with diabetic peripheral angiopathy without gangrene: Secondary | ICD-10-CM | POA: Diagnosis not present

## 2021-04-10 DIAGNOSIS — N1831 Chronic kidney disease, stage 3a: Secondary | ICD-10-CM | POA: Diagnosis not present

## 2021-04-10 DIAGNOSIS — E662 Morbid (severe) obesity with alveolar hypoventilation: Secondary | ICD-10-CM | POA: Diagnosis not present

## 2021-04-10 DIAGNOSIS — M1990 Unspecified osteoarthritis, unspecified site: Secondary | ICD-10-CM | POA: Diagnosis not present

## 2021-04-10 DIAGNOSIS — G7281 Critical illness myopathy: Secondary | ICD-10-CM | POA: Diagnosis not present

## 2021-04-10 DIAGNOSIS — I872 Venous insufficiency (chronic) (peripheral): Secondary | ICD-10-CM | POA: Diagnosis not present

## 2021-04-10 DIAGNOSIS — J9621 Acute and chronic respiratory failure with hypoxia: Secondary | ICD-10-CM | POA: Diagnosis not present

## 2021-04-10 DIAGNOSIS — J449 Chronic obstructive pulmonary disease, unspecified: Secondary | ICD-10-CM | POA: Diagnosis not present

## 2021-04-10 NOTE — Telephone Encounter (Signed)
Spoke with son and advised him that I did send another message to [facility] and advised that they said they did not answer but when I called pt he stated he talked with someone about the concentrator.  Also advised him that if pt started feel bad again that he needs to go to ED right away.  I gave [facility] sons phone number.

## 2021-04-10 NOTE — Telephone Encounter (Signed)
Patient's son called In regards to personal oxygen concentrator , and states it was supposed to be called in , I asked did he speak with pulmonology and he stated they said he needs to contact his PCP    Son Maxamillian Tienda) :  [phone]

## 2021-04-10 NOTE — Telephone Encounter (Signed)
Spoke with patient.  He does not want to go back to ER.  He states that he feels better now.  We have been working on getting an carry around Conservation officer, nature.  He stated that he does not think that he is getting enough air and 2 liters is not enough.  Advised that he needs to schedule a follow up with pulmonary as soon as possible.  Appointments has been canceled because of patient being in the hospital.  He stated that he will call pulmonary now when we get off the phone.  He stated that someone did call him from [facility] but he could not remember when they were going to come out.  I advised son about this.  With the weekend coming up if he started to feel bad again advised him and his son that he will need to go to ER.

## 2021-04-10 NOTE — Telephone Encounter (Signed)
Patient is using [name] and they are coming out on Monday.

## 2021-04-10 NOTE — Telephone Encounter (Signed)
[name] is calling the social worker at the hospital to find out how they need to go about to get the POC.

## 2021-04-10 NOTE — Patient Instructions (Signed)
Visit Information  PATIENT GOALS:  Goals Addressed             This Visit's Progress    Chronic Care Management Pharmacy Care Plan       CARE PLAN ENTRY (see longitudinal plan of care for additional care plan information)  Current Barriers:  Chronic Disease Management support, education, and care coordination needs related to Hyperlipidemia, AFib, COPD, Gout, Osteoarthritis/Neuropathy, Edema   Hyperlipidemia Lab Results  Component Value Date/Time   LDLCALC 57 01/09/2021 10:45 AM   LDLCALC 69 07/08/2020 01:23 PM   LDLDIRECT 94.0 10/09/2020 01:37 PM  Pharmacist Clinical Goal(s): Over the next 90 days, patient will work with PharmD and providers to maintain LDL goal < 70 Current regimen:  Atorvastatin 80mg  take 1 tablet daily Interventions: Discussed LDL goal  Patient self care activities - Over the next 90 days, patient will: Maintain cholesterol medication regimen.   Atrial Fibrillation:  Pharmacist Clinical Goal(s): Over the next 90 days, patient will work with PharmD and providers to prevent stroke  Current regimen:  Eliquis 5mg  take 1 tablet twice a day Interventions: Starting process for applying for patient assistance for Eliquis since in Medicare coverage gap Patient self care activities - Over the next 90 days, patient will: Complete application for Eliquis patient assistance; Return to clinical pharmacist at [facility] (Dr Frederik Pear office) and [facility] with total out of pocket costs for medications for household.  Continue current therapy for atrial fibrillation / stroke preventions  Chronic venous stasis / edema (swelling) in lower extremities:  Pharmacist Clinical Goal(s): Over the next 90 days, patient will work with PharmD and providers to decrease swelling in lower extremities and prevent infection and rehospitalization Current regimen:  Furosemide 20mg  daily as needed Ammonium lactate apply twice a day to lower  legs Interventions: Discussed weighing daily and how to take furosemide Patient self care activities - Over the next 90 days, patient will: Will check with home health about scales for patient to use at home.  Weigh daily; call office if weight increases by more than 3 lbs in 24 hours or 5lbs in 1 weeks Continue current therapy   COPD Pharmacist Clinical Goal(s) Over the next 90 days, patient will work with PharmD and providers to reduce symptoms associated with COPD Current regimen:  Trelegy 1 puff daily Ventolin HFA 2 puffs every 6 hours as needed  Montelukast 10mg  daily Supplemental oxygen 2L during day and 3L at night Interventions: Discussed utility of rescue inhaler like Ventolin Starting process for applying for patient assistance for Trelegy since in Medicare coverage gap Patient self care activities - Over the next 90 days, patient will: Continue to use Trelegy every day (maintenance inhaler) Continue to use Ventolin / albuterol inhaler as needed for shortness of breath and wheezing (rescue inhaler)  Complete application for Trelegy patient assistance; Return to clinical pharmacist at [facility] (Dr Frederik Pear office) and include [facility] with total out of pocket costs for medications for household.   Medication management Pharmacist Clinical Goal(s): Over the next 90 days, patient will work with PharmD and providers to maintain optimal medication adherence [facility] Interventions Comprehensive medication review performed. Continue current medication management strategy Patient self care activities - Over the next 90 days, patient will: Focus on medication adherence by filling and taking medications appropriately  Take medications as prescribed Report any questions or concerns to PharmD and/or provider(s)  Please see past updates related to this goal  by clicking on the "Past Updates" button in the selected goal          The patient  verbalized understanding of instructions, educational materials, and care plan provided today and declined offer to receive copy of patient instructions, educational materials, and care plan.   Telephone follow up appointment with care management team member scheduled for: 2 to 3 weeks  Cherre Robins, PharmD Clinical Pharmacist [facility]

## 2021-04-10 NOTE — Telephone Encounter (Signed)
I called and was speaking with the pts son and his phone went out.  Not able to get the pts son back.  Will wait for call back.  There is no pending order that I see that was placed for a POC from the hospital stay and that was 3 weeks ago.

## 2021-04-10 NOTE — Chronic Care Management (AMB) (Signed)
Chronic Care Management Pharmacy Note  04/10/2021 Name:  [name] MRN:  [id number] DOB:  [birth year]   Subjective: [name] is an 80 y.o. year old male who is a primary patient of [name], MD.  The CCM team was consulted for assistance with disease management and care coordination needs.    Engaged with patient by telephone for follow up visit in response to provider referral for pharmacy case management and/or care coordination services.   Consent to Services:  The patient was given information about Chronic Care Management services, agreed to services, and gave verbal consent prior to initiation of services.  Please see initial visit note for detailed documentation.   Patient Care Team: [name], MD as PCP - General (Family Medicine) Constance Haw, MD as PCP - Electrophysiology (Cardiology) Josue Hector, MD as PCP - Cardiology (Cardiology) Josue Hector, MD as Consulting Physician (Cardiology) [name] Hughs, MD as Attending Physician (Urology) Cira Rue, RN Nurse Navigator as Registered Nurse (Medical Oncology) Cherre Robins, PharmD (Pharmacist)  Recent office visits: 03/24/2021 - PCP (Dr Charlett Blake) [facility] F/U. Place referral for home health. Dilfucan 155m c 2 doses and nystatin cream bid prescribed for candidiasis found under abdominal panus.  01/13/2021 - PCP (Dr BCharlett Blake F/U chronic conditions. No med changes noted. 04/07/022 - video visit for COPD exacerbation - prescrived ceterizien 147mdaily and doxycycline 10061mid Recent consult visits: 02/03/2021 - cardio (WaJuleen ChinaN) Pacemaker device check 01/27/2021 - podiatry (Dr GalElisha Ponder/u risk foot care; nail debridement completed  Hospital visits: Medication Reconciliation was completed by comparing discharge summary, patient's EMR and Pharmacy list, and upon discussion with patient.   Admitted to the hospital on 02/15/2021 due to decompensated respiratory failure, pneumonia;  plural effusion with thoracentesis during stay. Discharge date was 03/02/2021. [facility]   Medication Changes at Hospital Discharge: -Changed furosemide 21m52mily as needed   Medications Discontinued at Hospital Discharge: -Stopped Ceterizine; Hematinic; CoEnzyme Q10   Medications that remain the same after Hospital Discharge:?? -All other medications will remain the same.     Rehab from 03/02/2021 to 03/18/2021   Objective:  Lab Results  Component Value Date   CREATININE 1.24 03/24/2021   CREATININE 1.29 (H) 03/16/2021   CREATININE 1.27 (H) 03/09/2021    Lab Results  Component Value Date   HGBA1C 5.6 10/09/2020   Last diabetic Eye exam: No results found for: HMDIABEYEEXA  Last diabetic Foot exam: No results found for: HMDIABFOOTEX      Component Value Date/Time   CHOL 116 01/09/2021 1045   CHOL 140 08/22/2018 0951   TRIG 153.0 (H) 01/09/2021 1045   HDL 27.90 (L) 01/09/2021 1045   HDL 30 (L) 08/22/2018 0951   CHOLHDL 4 01/09/2021 1045   VLDL 30.6 01/09/2021 1045   LDLCALC 57 01/09/2021 1045   LDLCALC 69 07/08/2020 1323   LDLDIRECT 94.0 10/09/2020 1337    Hepatic Function Latest Ref Rng & Units 03/24/2021 03/03/2021 02/15/2021  Total Protein 6.0 - 8.3 g/dL 6.2 5.3(L) 5.3(L)  Albumin 3.5 - 5.2 g/dL 3.5 2.5(L) 2.8(L)  AST 0 - 37 U/L '20 20 27  ' ALT 0 - 53 U/L '14 17 19  ' Alk Phosphatase 39 - 117 U/L 73 62 48  Total Bilirubin 0.2 - 1.2 mg/dL 0.5 0.4 1.3(H)  Bilirubin, Direct 0.0 - 0.3 mg/dL - - -    Lab Results  Component  Value Date/Time   TSH 1.393 02/23/2021 07:52 AM   TSH 1.64 10/09/2020 01:37 PM   TSH 1.29 07/08/2020 01:23 PM    CBC Latest Ref Rng & Units 03/24/2021 03/18/2021 03/17/2021  WBC 4.0 - 10.5 K/uL 7.0 5.3 5.6  Hemoglobin 13.0 - 17.0 g/dL 10.9(L) 10.4(L) 10.8(L)  Hematocrit 39.0 - 52.0 % 33.2(L) 32.2(L) 33.8(L)  Platelets 150.0 - 400.0 K/uL 149.0(L) 122(L)  108(L)    Lab Results  Component Value Date/Time   VD25OH 46.4 08/22/2018 09:51 AM   VD25OH 47.9 12/06/2017 09:54 AM    Clinical ASCVD: Yes  The ASCVD Risk score Mikey Bussing DC Jr., et al., 2013) failed to calculate for the following reasons:   The valid total cholesterol range is 130 to 320 mg/dL    CHADS2VASc = 4  Social History   Tobacco Use  Smoking Status Former   Types: Pipe   Quit date: 09/20/2006   Years since quitting: 14.5  Smokeless Tobacco Never   BP Readings from Last 3 Encounters:  03/24/21 (!) 147/78  03/18/21 (!) 135/95  03/02/21 118/68   Pulse Readings from Last 3 Encounters:  03/24/21 68  03/18/21 60  03/02/21 70   Wt Readings from Last 3 Encounters:  03/18/21 (!) (P) 372 lb 5.7 oz (168.9 kg)  02/25/21 (!) 378 lb 12 oz (171.8 kg)  02/13/21 (!) 389 lb 1.8 oz (176.5 kg)    Assessment: Review of patient past medical history, allergies, medications, health status, including review of consultants reports, laboratory and other test data, was performed as part of comprehensive evaluation and provision of chronic care management services.   SDOH:  (Social Determinants of Health) assessments and interventions performed:    CCM Care Plan  Allergies  Allergen Reactions   Sulfa Antibiotics     Other reaction(s): Unknown   Coumadin [Warfarin Sodium] Other (See Comments)    States he can't be on this-bleeds out    Medications Reviewed Today     Reviewed by Cherre Robins, PharmD (Pharmacist) on 04/10/21 at 0702  Med List Status: <None>   Medication Order Taking? Sig Documenting Provider Last Dose Status Informant  acetaminophen (TYLENOL) 325 MG tablet [id number] Yes Take 1-2 tablets (325-650 mg total) by mouth every 4 (four) hours as needed for mild pain.  Patient taking differently: Take 650 mg by mouth 2 (two) times daily.   Bary Leriche, PA-C Taking Active   albuterol (VENTOLIN HFA) 108 (90 Base) MCG/ACT inhaler [id number] Yes TAKE 2 PUFFS BY MOUTH EVERY 6  HOURS AS NEEDED Midge Minium, MD Taking Active Self  allopurinol (ZYLOPRIM) 100 MG tablet [id number] Yes Take 1 tablet (100 mg total) by mouth daily. [name], MD Taking Active Self  ammonium lactate (LAC-HYDRIN) 12 % lotion [id number] Yes Apply topically 2 (two) times daily. Apply to BLE twice a day and cover with light dressing. Wash off every morning and reply lotion Love, Ivan Anchors, PA-C Taking Active   atorvastatin (LIPITOR) 80 MG tablet [id number] Yes 1 tablet by mouth daily [name], MD Taking Active Self  Cholecalciferol (VITAMIN D3) 125 MCG (5000 UT) CAPS [id number] Yes Take 1,000 Units by mouth daily. [provider] Taking Active Self  ELIQUIS 5 MG TABS tablet [id number] Yes TAKE 1 TABLET BY MOUTH TWICE A DAY Josue Hector, MD Taking Active   famotidine (PEPCID) 40 MG tablet [id number] Yes TAKE 1 TABLET BY MOUTH EVERY DAY [name], MD Taking Active   Fluticasone-Umeclidin-Vilant (TRELEGY ELLIPTA) 100-62.5-25 MCG/INH  AEPB [id number] Yes Inhale 1 puff into the lungs daily. Deneise Lever, MD Taking Active Self  furosemide (LASIX) 20 MG tablet [id number] Yes Take 1 tablet (20 mg total) by mouth daily as needed for fluid or edema (for weight gain >3 lbs in one day or 5 lbs in one week). Bary Leriche, PA-C Taking Active   gabapentin (NEURONTIN) 300 MG capsule [id number] Yes Take 1 capsule (300 mg total) by mouth 2 (two) times daily. Flora Lipps Taking Active   HEMATINIC/FOLIC ACID 440-3 MG TABS [id number] Yes TAKE 1 TABLET BY MOUTH EVERY DAY [name], MD Taking Active   Misc Natural Products Teena Dunk CHERRY ADVANCED PO) [id number] Yes Take 1,200 mg by mouth 2 (two) times a day.  [provider] Taking Active Self  Multiple Vitamins-Minerals (CENTRUM SILVER PO) [id number] Yes Take 1 tablet by mouth daily. [provider] Taking Active Self  nystatin cream (MYCOSTATIN) [id number] Yes Apply 1 application topically 2 (two) times daily. [name], MD Taking Active   polyethylene glycol (MIRALAX / GLYCOLAX) 17 g packet [id number] Yes Take 17 g by mouth daily as needed. [place] Donnamarie Poag, DO Taking Active   vitamin C (ASCORBIC ACID) 500 MG tablet [id number] Yes Take 500 mg by mouth daily. [provider] Taking Active Self            Patient Active Problem List   Diagnosis Date Noted   Candidiasis 03/25/2021   Intensive care (ICU) myopathy 03/05/2021   Pressure injury of skin 03/02/2021   Debility 03/02/2021   Cervical stenosis of spine    Spinal cord myoclonus    CKD stage 3 secondary to diabetes ([place]) 02/15/2021   Obesity hypoventilation syndrome ([place]) 02/15/2021   Acute on chronic respiratory failure with hypoxemia ([place]) 02/15/2021   Respiratory failure ([place]) 02/12/2021   Acute on chronic respiratory failure with hypoxia and hypercapnia ([place]) 02/12/2021   Abnormal albumin 06/15/2020   Proteins serum plasma low 06/02/2020   Injury of toe on right foot 05/20/2020   Chronic respiratory failure with hypoxia ([place]) 02/26/2020   Coagulation defect ([place]) 12/07/2019   Malignant neoplasm of prostate ([place]) 05/15/2019   Pain due to onychomycosis of toenails of both feet 03/13/2019   PVD (peripheral vascular disease) ([place]) 03/13/2019   Prostate cancer ([place]) 02/13/2019   Urinary tract infection 12/16/2018   Persistent atrial fibrillation ([place]) 10/17/2018   Primary osteoarthritis of left shoulder 08/23/2018   Renal insufficiency 06/29/2018   Bradycardia 04/26/2018   Other fatigue 12/06/2017   Shortness of breath on exertion 12/06/2017   Essential hypertension 12/06/2017   Hyperglycemia 07/28/2017   Neck pain on left side 01/25/2017   Low back pain 01/25/2017   Constipation 08/15/2015   Severe aortic valve stenosis    Medicare annual wellness visit, subsequent 03/09/2015   Preventative health care 03/09/2015   Allergic rhinitis 12/02/2014   COPD mixed type ([place]) 12/02/2014   Chronic venous insufficiency  10/02/2013   Venous stasis dermatitis 10/02/2013   AVM (arteriovenous malformation) of colon with hemorrhage 02/10/2013   Thrombocytopenia, unspecified ([place]) 02/09/2013   EDEMA 04/15/2010   HYPERCHOLESTEROLEMIA 04/14/2010   Gout 04/14/2010   Anemia 04/14/2010   Aortic valve disorder 04/14/2010   VALVULAR HEART DISEASE 04/14/2010   Osteoarthritis 04/14/2010   Multilevel degenerative disc disease 04/14/2010   BURSITIS 04/14/2010   Obesity, Class III, BMI 40-49.9 (morbid obesity) ([place]) 03/21/2010   Obstructive sleep apnea 02/19/2008    Immunization History  Administered Date(s) Administered  Influenza Split 07/08/2011, 06/20/2012   Influenza, High Dose Seasonal PF 06/22/2016, 07/06/2017, 06/09/2018, 05/02/2019, 05/30/2020   Influenza,inj,Quad PF,6+ Mos 06/07/2013, 07/29/2014, 07/18/2015   Influenza-Unspecified 06/26/2017   Moderna SARS-COV2 Booster Vaccination 07/28/2020   Moderna Sars-Covid-2 Vaccination 10/27/2019, 11/19/2019   PPD Test 02/16/2013   Pneumococcal Conjugate-13 10/11/2013   Pneumococcal Polysaccharide-23 11/07/2015   Td 08/26/2006, 03/28/2018   Zoster Recombinat (Shingrix) 11/08/2018, 03/25/2019   Zoster, Live 07/27/2013    Conditions to be addressed/monitored: Atrial Fibrillation, HTN, HLD, COPD, and CKD Stage 3  Care Plan : General Pharmacy (Adult)  Updates made by Cherre Robins, PHARMD since 04/10/2021 12:00 AM     Problem: HTN; COPD; HDL; Afib; PVD: venous stasis; neuropathy; h/o prostate cancer; constipation   Priority: High     Goal: [place] goals regarding chronic care and medication management   Start Date: 03/19/2021  Recent Progress: On track  Priority: High  Note:   Current Barriers:  Unable to independently afford treatment regimen Unable to achieve control of venous stasis / edema  Unable to maintain control of COPD  Pharmacist Clinical Goal(s):  Over the next 90 days, patient will verbalize ability to afford treatment  regimen achieve control of edema and COPD as evidenced by decreased swelling and exacerbations of COPD maintain control of HTN and HDL as evidenced by maintaining goals listed below  adhere to prescribed medication regimen as evidenced by fill history / pill count  through collaboration with PharmD and provider.   Interventions: 1:1 collaboration with [name], MD regarding development and update of comprehensive plan of care as evidenced by provider attestation and co-signature Inter-disciplinary care team collaboration (see longitudinal plan of care) Comprehensive medication review performed; medication list updated in electronic medical record  Hyperlipidemia Controlled; LDL goal < 70 Recent increase in atorvastatin from 70m daily to 875mdaily  Current regimen:  Atorvastatin 8049make 1 tablet daily Interventions: Discussed LDL goal  Maintain cholesterol medication regimen.   Atrial Fibrillation:  Goal: prevent stroke  Denies s/s of bleeding States he is now in Medicare coverage gap and Eliquis cost increased to >$100 per month Current regimen:  Eliquis 5mg34mke 1 tablet twice a day Interventions: Followed up with patient regarding PAP for Eliquis. He has received application but has not completed patient potion yet. Encouraged him to complete and return to office as soon as possible. Report for 2022 [facility] at [facility].  Continue current therapy for atrial fibrillation / stroke prevention  Chronic venous stasis / edema in lower extremities:  Goal: decrease swelling in lower extremities and prevent infection and rehospitalization Improving edema per patient Current regimen:  Furosemide 20mg24mly as needed (taking once daily) Ammonium lactate apply twice a day to lower legs Interventions: Discussed weighing daily and how to take furosemide; instructed to call office if weight increases by more than 3 lbs in 24 hours or 5lbs in 1 week  however patient has not received scales yet.  Message sent to PCP staff regarding patient request for scales.  Continue current therapy   COPD Goal: reduce symptoms associated with COPD Recent hospitalization for decompensated respiratory failure, pneumonia and plural effusion that required extensive hospital stay and rehab Patient reports today that his breathing is better but he is in need of smaller oxygen tank as he and his wife are having difficulty when ambulating / manuvering with his current take. Reviewed chart and it looks like PCP office is aware and working on this  request.  Patient uses [facility] for O2 needs. Rescue inhaler use - only once in the last 2 weeks. Reports using maintenance inhaler daily Current regimen:  Trelegy 1 puff daily Ventolin HFA 2 puffs every 6 hours as needed  Montelukast 34m daily Supplemental oxygen 2L during day and 3L at night Flutter valve - use 4 times daily Interventions: Discussed utility of rescue inhaler like Ventolin. Continue to use Ventolin / albuterol inhaler as needed for shortness of breath and wheezing (rescue inhaler)  Followed up with patient regarding PAP for Trelegy. He has received application but has not completed patient potion yet. Encouraged him to complete and return to office as soon as possible. Report for 2022 medication cost [facility] at [facility] visit.  Continue to use Trelegy every day (maintenance inhaler)  Medication management [facility] [place] Patient asked for gabapentin to be refills.  Interventions Comprehensive medication review performed. Continue current medication management strategy Education provided to patient regarding medications, indications and appropriate adminstraiton [facility] with [facility] to [facility] - there was already a prescription with refills on patient's profile.   Patient Goals/Self-Care Activities Over the next 90 days, patient will:  take medications as  prescribed, weigh daily, and contact provider if weight gain of more than 3 lbs in 24 hours or 5 lbs in 1 week, and collaborate with provider on medication access solutions  Follow Up Plan: Telephone follow up appointment with care management team member scheduled for:  2 to 3 weeks         Medication Assistance: Application for Eliquis and Trelegy  medication assistance program have been provided to patient. Awaiting completion and return to office.  Anticipated assistance start date 04/2021.  See plan of care for additional detail.  Patient's preferred pharmacy is:  [id number] [place], [place] - [address] [place] [id number]: [phone]: [phone] Follow Up:  Patient agrees to Care Plan and Follow-up.  Plan: Telephone follow up appointment with care management team member scheduled for:  2 to 3 weeks  TaCherre RobinsPharmD Clinical Pharmacist [phone]

## 2021-04-10 NOTE — Telephone Encounter (Signed)
Patient's wife states patient keep falling asleep during conversations or closing his eyes and out for a few minutes and then coming back , and she said last night she had a hard time waking him up to get him to go to bed, and states he slurring and he is pale.

## 2021-04-13 DIAGNOSIS — N1831 Chronic kidney disease, stage 3a: Secondary | ICD-10-CM | POA: Diagnosis not present

## 2021-04-13 DIAGNOSIS — M1990 Unspecified osteoarthritis, unspecified site: Secondary | ICD-10-CM | POA: Diagnosis not present

## 2021-04-13 DIAGNOSIS — E1151 Type 2 diabetes mellitus with diabetic peripheral angiopathy without gangrene: Secondary | ICD-10-CM | POA: Diagnosis not present

## 2021-04-13 DIAGNOSIS — E1122 Type 2 diabetes mellitus with diabetic chronic kidney disease: Secondary | ICD-10-CM | POA: Diagnosis not present

## 2021-04-13 DIAGNOSIS — I872 Venous insufficiency (chronic) (peripheral): Secondary | ICD-10-CM | POA: Diagnosis not present

## 2021-04-13 DIAGNOSIS — G7281 Critical illness myopathy: Secondary | ICD-10-CM | POA: Diagnosis not present

## 2021-04-13 DIAGNOSIS — J449 Chronic obstructive pulmonary disease, unspecified: Secondary | ICD-10-CM | POA: Diagnosis not present

## 2021-04-13 DIAGNOSIS — Z6841 Body Mass Index (BMI) 40.0 and over, adult: Secondary | ICD-10-CM | POA: Diagnosis not present

## 2021-04-13 DIAGNOSIS — J9621 Acute and chronic respiratory failure with hypoxia: Secondary | ICD-10-CM | POA: Diagnosis not present

## 2021-04-13 DIAGNOSIS — E662 Morbid (severe) obesity with alveolar hypoventilation: Secondary | ICD-10-CM | POA: Diagnosis not present

## 2021-04-13 NOTE — Progress Notes (Signed)
HPI M former smoker followed for OSA, COPD, chronic hypoxic respiratory failure complicated by morbid obesity, OHS, aortic stenosis/ AVR,  Osteoarthritis, Anemia PFT 05/05/2015-severe obstructive airways disease, insignificant response to bronchodilator, severe restriction, moderate diffusion defect FVC 2.17/46%, FEV1 1.50/44%, ratio 0.69, TLC 64%, DLCO 57% with volume correction to 62% of predicted NPSG 06/21/85- AHI 98/hour with desaturation to 64% BiPAP titration study-21/17, PS 4 cwp O2 2L sleep, with residual AHI 52 "unknown" events, either obstructive or central and presumably mostly RERAs.  ------------------------------------------------------------   09/08/20- 80 year old male former smoker followed for OSA, COPD mixed type, Chronic Respiratory failure with Hypoxia and Hypercapnia, complicated by morbid Obesity/ OHS, aortic stenosis/TAVR/ PAFib/ Eliquis, pacemaker, peripheral vascular disease, osteoarthritis, AVM colon, HBP, gout, Prostate cancer, HTN,  -Trelegy 100,  Ventolin HFA, Singulair VPAPauto 12/12, PS 0 / O2 2L sleep/ [facility] Download- compliance 100%, AHI 0.4/ hr Body weight today-385 lbs Covid vax- 3 Moderna Flu vax- had -----Patient is doing good overall, no concerns at this time.  We discussed med costs in doughnut hole.  Very comfortable with his VPAP. His own oximeter shows considerable variation at times from finger to finger. We will watch for need to qualify for portable O2.  No chest changes- cough, wheeze or chest pain.  CXR 05/08/20-  IMPRESSION: 1. Stable cardiomegaly. No pulmonary edema. No significant pleural effusion. 2. Mild peribronchial thickening.  04/14/21- 80 year old male former smoker followed for OSA, COPD mixed type, Chronic Respiratory failure with Hypoxia and Hypercapnia, complicated by morbid Obesity/ OHS, aortic stenosis/TAVR/ AFib/ Eliquis, pacemaker, peripheral vascular disease, osteoarthritis, AVM colon, HBP, gout, Prostate cancer, HTN,   -Trelegy 100,  Ventolin HFA, Singulair, Flutter,  VPAPauto 12/12, PS 0 / O2 3L sleep/ Exertion/ [facility] Download- compliance 100%, AHI 0.4 Body weight today- Covid vax-3 Moderna Hosp 6/13-6/29- A on C Resp Failure, RLL pneumonia>thoracentesis R. Myoclonus due to cervical stenosis> Keppra, Rehab. Discharge weight 371 lbs. Stasis dermatitis. PRN lasix fluid retention vs renal function. ------VPAP-masks leaks about 3am each night, increased sob x 3-4 days,dry cough Last hgb 10.9 on 7/5. Couldn't stand to weigh. Margorie John and wife here. Concentrator got replaced. Vivid dreams with no more activity during sleep. Noting more daytime sleepiness. ABG on 3L O2 04/14/21- pH 7.159, PO2 135, PCO2 100, HCO3 34.2 CXR 03/06/20- IMPRESSION: Bibasilar atelectasis and small pleural effusions with mild progression. Negative for edema. CXR 04/14/21- my read= bilateral pleural effusions, mild pulmonary edema. Radiology report pending.   ROS- see HPI  + = positive Constitutional:   No-   weight loss, night sweats, fevers, chills, +fatigue, lassitude. HEENT:   No-  headaches, difficulty swallowing, tooth/dental problems, sore throat,       No-  sneezing, itching, ear ache, nasal congestion, post nasal drip,  CV:  No-   chest pain, orthopnea, PND, swelling in lower extremities, anasarca,  dizziness, palpitations Resp: +shortness of breath with exertion or at rest.              No-   productive cough,  No non-productive cough,  No- coughing up of blood.              No-   change in color of mucus.  No- wheezing.   Skin: No-   rash or lesions. GI:  No-   heartburn, indigestion, abdominal pain, nausea, vomiting, GU:  MS:  No-   joint pain or swelling.  + back pain Neuro-     nothing unusual Psych:  No- change in mood or affect. No depression  or anxiety.  No memory loss.  OBJ General- Alert, Oriented, Affect-appropriate, Distress- none acute. +Morbidly obese, +power wheelchair. Friendly, very talkative on room air.   Skin- rash-none, lesions- none, excoriation- none Lymphadenopathy- none Head- atraumatic            Eyes- Gross vision intact, PERRLA, conjunctivae clear secretions            Ears- Hearing, canals-normal            Nose- Clear, no-Septal dev, mucus, polyps, erosion, perforation             Throat- Mallampati III-IV , mucosa clear , drainage- none, tonsils- atrophic Neck- flexible , trachea midline, no stridor , thyroid nl, carotid no bruit Chest - symmetrical excursion , unlabored           Heart/CV- RRR , murmur-none, no gallop, no rub, nl s1 s2                           - JVD- none , edema+, stasis changes+, varices- none           Lung- clear to P&A, wheeze- none, cough- none , dullness-none, rub- none, no valve click heard                           Room air saturation while awake and sitting upright on arrival today 93%           Chest wall-+ pacemaker left  abd-  Br/ Gen/ Rectal- Not done, not indicated Extrem- cyanosis- none, clubbing, none, atrophy- none, strength- nl Neuro- grossly intact to observation, alert and pleasant

## 2021-04-14 ENCOUNTER — Ambulatory Visit (HOSPITAL_COMMUNITY)
Admission: RE | Admit: 2021-04-14 | Discharge: 2021-04-14 | Disposition: A | Discharge status: Home / Self Care | Payer: Medicare HMO | Source: Ambulatory Visit | Attending: Internal Medicine | Admitting: Internal Medicine

## 2021-04-14 ENCOUNTER — Other Ambulatory Visit: Payer: Medicare HMO

## 2021-04-14 ENCOUNTER — Other Ambulatory Visit: Payer: Self-pay

## 2021-04-14 ENCOUNTER — Ambulatory Visit: Payer: Medicare HMO | Admitting: Internal Medicine

## 2021-04-14 ENCOUNTER — Telehealth: Payer: Self-pay | Admitting: Internal Medicine

## 2021-04-14 ENCOUNTER — Ambulatory Visit: Payer: Medicare HMO

## 2021-04-14 ENCOUNTER — Encounter: Payer: Self-pay | Admitting: Internal Medicine

## 2021-04-14 ENCOUNTER — Telehealth: Payer: Self-pay

## 2021-04-14 VITALS — BP 128/60 | HR 76 | Temp 98.4°F | Ht 74.0 in

## 2021-04-14 DIAGNOSIS — J449 Chronic obstructive pulmonary disease, unspecified: Secondary | ICD-10-CM

## 2021-04-14 DIAGNOSIS — J9611 Chronic respiratory failure with hypoxia: Secondary | ICD-10-CM

## 2021-04-14 DIAGNOSIS — G4733 Obstructive sleep apnea (adult) (pediatric): Secondary | ICD-10-CM | POA: Diagnosis not present

## 2021-04-14 DIAGNOSIS — E662 Morbid (severe) obesity with alveolar hypoventilation: Secondary | ICD-10-CM

## 2021-04-14 DIAGNOSIS — J9622 Acute and chronic respiratory failure with hypercapnia: Secondary | ICD-10-CM | POA: Diagnosis not present

## 2021-04-14 DIAGNOSIS — J9621 Acute and chronic respiratory failure with hypoxia: Secondary | ICD-10-CM

## 2021-04-14 DIAGNOSIS — E66813 Obesity, class 3: Secondary | ICD-10-CM

## 2021-04-14 DIAGNOSIS — J90 Pleural effusion, not elsewhere classified: Secondary | ICD-10-CM | POA: Diagnosis not present

## 2021-04-14 DIAGNOSIS — J9690 Respiratory failure, unspecified, unspecified whether with hypoxia or hypercapnia: Secondary | ICD-10-CM | POA: Diagnosis not present

## 2021-04-14 LAB — BLOOD GAS, ARTERIAL
Acid-Base Excess: 3.5 mmol/L — ABNORMAL HIGH (ref 0.0–2.0)
Bicarbonate: 34.2 mmol/L — ABNORMAL HIGH (ref 20.0–28.0)
Drawn by: 29503
O2 Saturation: 97.7 %
Patient temperature: 37
pCO2 arterial: 100 mmHg (ref 32.0–48.0)
pH, Arterial: 7.159 — CL (ref 7.350–7.450)
pO2, Arterial: 135 mmHg — ABNORMAL HIGH (ref 83.0–108.0)

## 2021-04-14 NOTE — Telephone Encounter (Signed)
Pt wife [name] calling because after they left our office, they went to the hospital for labs CY ordered. Pt sat for a long time, and pt unable to get up(sat 4 hours) called Fire Dept, they can usually get pt up. Pt spouse asking if CY can order help for pt, like home health help. Pt is home and they just got pt in a chair. Please advise [phone]

## 2021-04-14 NOTE — Assessment & Plan Note (Signed)
NIV should help while sleeping

## 2021-04-14 NOTE — Assessment & Plan Note (Signed)
Plan- replacing VPAP with Trilogy NIV

## 2021-04-14 NOTE — Assessment & Plan Note (Signed)
Massively obese- chronic

## 2021-04-14 NOTE — Assessment & Plan Note (Signed)
CHF and OHS Plan- reduce O2 to 2L to improve ventilatory drive. Change from VPAP machine to NIV (Trilogy) for respiratory support while sleeping

## 2021-04-14 NOTE — Patient Instructions (Signed)
Order- Schedule ABG on 3L O2- now if possible     Dx Chronic Respiratory Failure with Hypoxia and Hypercapnia  Order- CXR at [facility]      Dx Chronic Resp failure   Order DME [place] [facility]  We will see if you qualify to change your BIPAP machine to a Trilogy ventilator at night

## 2021-04-14 NOTE — Telephone Encounter (Signed)
Documentation- Lab- Blood gas confirms /CO2 retention. This is making him sleepy.  Order- DME [employer]: Reduce oxygen to 2L/ minute at all times.  Change VPAP machine to NIV (Trilogy) with mask of choice, supplies, etc. RT to adjust settings for dx Acute on Chronic Respiratory Failure with Hypoxia and Hypercapnea (ABG done today)- for sleep and as needed.  Portable O2 ( POC or Self-fill) that he can transport on back of wheel chair 2L continuous or 3L pulse  Adjustable (hospital type) bed

## 2021-04-14 NOTE — Telephone Encounter (Signed)
Critical lab PH-7.159, PC O2-100.  Sent to Dr Annamaria Boots.

## 2021-04-15 ENCOUNTER — Emergency Department (HOSPITAL_COMMUNITY): Payer: Medicare HMO

## 2021-04-15 ENCOUNTER — Encounter: Payer: Medicare HMO | Admitting: Registered Nurse

## 2021-04-15 ENCOUNTER — Inpatient Hospital Stay (HOSPITAL_COMMUNITY)
Admission: EM | Admit: 2021-04-15 | Discharge: 2021-04-20 | DRG: 189 | Disposition: E | Payer: Medicare HMO | Attending: Pulmonary Disease | Admitting: Pulmonary Disease

## 2021-04-15 DIAGNOSIS — X72XXXA Intentional self-harm by handgun discharge, initial encounter: Secondary | ICD-10-CM

## 2021-04-15 DIAGNOSIS — T1491XA Suicide attempt, initial encounter: Secondary | ICD-10-CM

## 2021-04-15 DIAGNOSIS — T1490XA Injury, unspecified, initial encounter: Secondary | ICD-10-CM

## 2021-04-15 DIAGNOSIS — J9601 Acute respiratory failure with hypoxia: Secondary | ICD-10-CM

## 2021-04-15 DIAGNOSIS — J9690 Respiratory failure, unspecified, unspecified whether with hypoxia or hypercapnia: Secondary | ICD-10-CM

## 2021-04-15 DIAGNOSIS — S0193XA Puncture wound without foreign body of unspecified part of head, initial encounter: Secondary | ICD-10-CM

## 2021-04-15 DIAGNOSIS — L8990 Pressure ulcer of unspecified site, unspecified stage: Secondary | ICD-10-CM | POA: Insufficient documentation

## 2021-04-15 DIAGNOSIS — J9622 Acute and chronic respiratory failure with hypercapnia: Secondary | ICD-10-CM

## 2021-04-15 DIAGNOSIS — J9620 Acute and chronic respiratory failure, unspecified whether with hypoxia or hypercapnia: Secondary | ICD-10-CM | POA: Diagnosis present

## 2021-04-15 DIAGNOSIS — Z950 Presence of cardiac pacemaker: Secondary | ICD-10-CM

## 2021-04-15 DIAGNOSIS — I739 Peripheral vascular disease, unspecified: Secondary | ICD-10-CM | POA: Diagnosis present

## 2021-04-15 DIAGNOSIS — Z888 Allergy status to other drugs, medicaments and biological substances status: Secondary | ICD-10-CM

## 2021-04-15 DIAGNOSIS — I872 Venous insufficiency (chronic) (peripheral): Secondary | ICD-10-CM | POA: Diagnosis present

## 2021-04-15 DIAGNOSIS — D329 Benign neoplasm of meninges, unspecified: Secondary | ICD-10-CM | POA: Diagnosis present

## 2021-04-15 DIAGNOSIS — E662 Morbid (severe) obesity with alveolar hypoventilation: Secondary | ICD-10-CM | POA: Diagnosis present

## 2021-04-15 DIAGNOSIS — J90 Pleural effusion, not elsewhere classified: Secondary | ICD-10-CM | POA: Diagnosis not present

## 2021-04-15 DIAGNOSIS — X748XXA Intentional self-harm by other firearm discharge, initial encounter: Secondary | ICD-10-CM | POA: Diagnosis not present

## 2021-04-15 DIAGNOSIS — Z66 Do not resuscitate: Secondary | ICD-10-CM | POA: Diagnosis not present

## 2021-04-15 DIAGNOSIS — J449 Chronic obstructive pulmonary disease, unspecified: Secondary | ICD-10-CM | POA: Diagnosis present

## 2021-04-15 DIAGNOSIS — S065X9A Traumatic subdural hemorrhage with loss of consciousness of unspecified duration, initial encounter: Secondary | ICD-10-CM | POA: Diagnosis present

## 2021-04-15 DIAGNOSIS — Z79899 Other long term (current) drug therapy: Secondary | ICD-10-CM

## 2021-04-15 DIAGNOSIS — M47812 Spondylosis without myelopathy or radiculopathy, cervical region: Secondary | ICD-10-CM | POA: Diagnosis not present

## 2021-04-15 DIAGNOSIS — Z23 Encounter for immunization: Secondary | ICD-10-CM

## 2021-04-15 DIAGNOSIS — I130 Hypertensive heart and chronic kidney disease with heart failure and stage 1 through stage 4 chronic kidney disease, or unspecified chronic kidney disease: Secondary | ICD-10-CM | POA: Diagnosis present

## 2021-04-15 DIAGNOSIS — G4733 Obstructive sleep apnea (adult) (pediatric): Secondary | ICD-10-CM | POA: Diagnosis not present

## 2021-04-15 DIAGNOSIS — E785 Hyperlipidemia, unspecified: Secondary | ICD-10-CM | POA: Diagnosis present

## 2021-04-15 DIAGNOSIS — R404 Transient alteration of awareness: Secondary | ICD-10-CM | POA: Diagnosis not present

## 2021-04-15 DIAGNOSIS — I2510 Atherosclerotic heart disease of native coronary artery without angina pectoris: Secondary | ICD-10-CM | POA: Diagnosis present

## 2021-04-15 DIAGNOSIS — Z515 Encounter for palliative care: Secondary | ICD-10-CM | POA: Diagnosis not present

## 2021-04-15 DIAGNOSIS — I5043 Acute on chronic combined systolic (congestive) and diastolic (congestive) heart failure: Secondary | ICD-10-CM | POA: Diagnosis present

## 2021-04-15 DIAGNOSIS — Z20822 Contact with and (suspected) exposure to covid-19: Secondary | ICD-10-CM | POA: Diagnosis present

## 2021-04-15 DIAGNOSIS — I350 Nonrheumatic aortic (valve) stenosis: Secondary | ICD-10-CM | POA: Diagnosis present

## 2021-04-15 DIAGNOSIS — S0190XA Unspecified open wound of unspecified part of head, initial encounter: Secondary | ICD-10-CM | POA: Diagnosis not present

## 2021-04-15 DIAGNOSIS — S0103XA Puncture wound without foreign body of scalp, initial encounter: Secondary | ICD-10-CM | POA: Diagnosis not present

## 2021-04-15 DIAGNOSIS — Z1810 Retained metal fragments, unspecified: Secondary | ICD-10-CM | POA: Diagnosis not present

## 2021-04-15 DIAGNOSIS — N182 Chronic kidney disease, stage 2 (mild): Secondary | ICD-10-CM | POA: Diagnosis present

## 2021-04-15 DIAGNOSIS — I482 Chronic atrial fibrillation, unspecified: Secondary | ICD-10-CM | POA: Diagnosis present

## 2021-04-15 DIAGNOSIS — Z7951 Long term (current) use of inhaled steroids: Secondary | ICD-10-CM

## 2021-04-15 DIAGNOSIS — L89152 Pressure ulcer of sacral region, stage 2: Secondary | ICD-10-CM | POA: Diagnosis present

## 2021-04-15 DIAGNOSIS — Z6841 Body Mass Index (BMI) 40.0 and over, adult: Secondary | ICD-10-CM

## 2021-04-15 DIAGNOSIS — G9341 Metabolic encephalopathy: Secondary | ICD-10-CM | POA: Diagnosis present

## 2021-04-15 DIAGNOSIS — D649 Anemia, unspecified: Secondary | ICD-10-CM | POA: Diagnosis present

## 2021-04-15 DIAGNOSIS — S06349A Traumatic hemorrhage of right cerebrum with loss of consciousness of unspecified duration, initial encounter: Secondary | ICD-10-CM | POA: Diagnosis not present

## 2021-04-15 DIAGNOSIS — Z9889 Other specified postprocedural states: Secondary | ICD-10-CM | POA: Diagnosis not present

## 2021-04-15 DIAGNOSIS — J9621 Acute and chronic respiratory failure with hypoxia: Secondary | ICD-10-CM | POA: Diagnosis present

## 2021-04-15 DIAGNOSIS — M7981 Nontraumatic hematoma of soft tissue: Secondary | ICD-10-CM | POA: Diagnosis not present

## 2021-04-15 DIAGNOSIS — Z952 Presence of prosthetic heart valve: Secondary | ICD-10-CM | POA: Diagnosis not present

## 2021-04-15 DIAGNOSIS — I700 Atherosclerosis of aorta: Secondary | ICD-10-CM | POA: Diagnosis not present

## 2021-04-15 DIAGNOSIS — S0181XA Laceration without foreign body of other part of head, initial encounter: Secondary | ICD-10-CM | POA: Diagnosis present

## 2021-04-15 DIAGNOSIS — S0183XA Puncture wound without foreign body of other part of head, initial encounter: Secondary | ICD-10-CM | POA: Diagnosis present

## 2021-04-15 DIAGNOSIS — Z8546 Personal history of malignant neoplasm of prostate: Secondary | ICD-10-CM

## 2021-04-15 DIAGNOSIS — S0003XA Contusion of scalp, initial encounter: Secondary | ICD-10-CM | POA: Diagnosis not present

## 2021-04-15 DIAGNOSIS — Z743 Need for continuous supervision: Secondary | ICD-10-CM | POA: Diagnosis not present

## 2021-04-15 DIAGNOSIS — R69 Illness, unspecified: Secondary | ICD-10-CM | POA: Diagnosis not present

## 2021-04-15 DIAGNOSIS — Z87891 Personal history of nicotine dependence: Secondary | ICD-10-CM

## 2021-04-15 DIAGNOSIS — Z7901 Long term (current) use of anticoagulants: Secondary | ICD-10-CM

## 2021-04-15 DIAGNOSIS — R062 Wheezing: Secondary | ICD-10-CM | POA: Diagnosis not present

## 2021-04-15 DIAGNOSIS — I517 Cardiomegaly: Secondary | ICD-10-CM | POA: Diagnosis not present

## 2021-04-15 DIAGNOSIS — S0101XA Laceration without foreign body of scalp, initial encounter: Secondary | ICD-10-CM | POA: Diagnosis not present

## 2021-04-15 LAB — SAMPLE TO BLOOD BANK

## 2021-04-15 LAB — I-STAT ARTERIAL BLOOD GAS, ED
Acid-Base Excess: 7 mmol/L — ABNORMAL HIGH (ref 0.0–2.0)
Acid-Base Excess: 11 mmol/L — ABNORMAL HIGH (ref 0.0–2.0)
Bicarbonate: 38.1 mmol/L — ABNORMAL HIGH (ref 20.0–28.0)
Bicarbonate: 38.7 mmol/L — ABNORMAL HIGH (ref 20.0–28.0)
Calcium, Ion: 1.45 mmol/L — ABNORMAL HIGH (ref 1.15–1.40)
Calcium, Ion: 1.37 mmol/L (ref 1.15–1.40)
HCT: 31 % — ABNORMAL LOW (ref 39.0–52.0)
HCT: 26 % — ABNORMAL LOW (ref 39.0–52.0)
Hemoglobin: 10.5 g/dL — ABNORMAL LOW (ref 13.0–17.0)
Hemoglobin: 8.8 g/dL — ABNORMAL LOW (ref 13.0–17.0)
O2 Saturation: 93 %
O2 Saturation: 98 %
Patient temperature: 96.5
Patient temperature: 97.7
Potassium: 4.4 mmol/L (ref 3.5–5.1)
Potassium: 4.5 mmol/L (ref 3.5–5.1)
Sodium: 141 mmol/L (ref 135–145)
Sodium: 139 mmol/L (ref 135–145)
TCO2: 41 mmol/L — ABNORMAL HIGH (ref 22–32)
TCO2: 41 mmol/L — ABNORMAL HIGH (ref 22–32)
pCO2 arterial: 101.6 mmHg (ref 32.0–48.0)
pCO2 arterial: 70.7 mmHg (ref 32.0–48.0)
pH, Arterial: 7.175 — CL (ref 7.350–7.450)
pH, Arterial: 7.344 — ABNORMAL LOW (ref 7.350–7.450)
pO2, Arterial: 84 mmHg (ref 83.0–108.0)
pO2, Arterial: 115 mmHg — ABNORMAL HIGH (ref 83.0–108.0)

## 2021-04-15 LAB — COMPREHENSIVE METABOLIC PANEL
ALT: 22 U/L (ref 0–44)
AST: 22 U/L (ref 15–41)
Albumin: 3 g/dL — ABNORMAL LOW (ref 3.5–5.0)
Alkaline Phosphatase: 73 U/L (ref 38–126)
Anion gap: 7 (ref 5–15)
BUN: 24 mg/dL — ABNORMAL HIGH (ref 8–23)
CO2: 33 mmol/L — ABNORMAL HIGH (ref 22–32)
Calcium: 9.9 mg/dL (ref 8.9–10.3)
Chloride: 99 mmol/L (ref 98–111)
Creatinine, Ser: 1.24 mg/dL (ref 0.61–1.24)
GFR, Estimated: 60 mL/min — ABNORMAL LOW (ref >60)
Glucose, Bld: 110 mg/dL — ABNORMAL HIGH (ref 70–99)
Potassium: 4.3 mmol/L (ref 3.5–5.1)
Sodium: 139 mmol/L (ref 135–145)
Total Bilirubin: 0.9 mg/dL (ref 0.3–1.2)
Total Protein: 5.8 g/dL — ABNORMAL LOW (ref 6.5–8.1)

## 2021-04-15 LAB — SALICYLATE LEVEL: Salicylate Lvl: <7 mg/dL — ABNORMAL LOW (ref 7.0–30.0)

## 2021-04-15 LAB — CBC
HCT: 32.8 % — ABNORMAL LOW (ref 39.0–52.0)
Hemoglobin: 9.8 g/dL — ABNORMAL LOW (ref 13.0–17.0)
MCH: 31.1 pg (ref 26.0–34.0)
MCHC: 29.9 g/dL — ABNORMAL LOW (ref 30.0–36.0)
MCV: 104.1 fL — ABNORMAL HIGH (ref 80.0–100.0)
Platelets: 164 10*3/uL (ref 150–400)
RBC: 3.15 MIL/uL — ABNORMAL LOW (ref 4.22–5.81)
RDW: 17.5 % — ABNORMAL HIGH (ref 11.5–15.5)
WBC: 7.1 10*3/uL (ref 4.0–10.5)
nRBC: 0 % (ref 0.0–0.2)

## 2021-04-15 LAB — I-STAT CHEM 8, ED
BUN: 28 mg/dL — ABNORMAL HIGH (ref 8–23)
Calcium, Ion: 1.3 mmol/L (ref 1.15–1.40)
Chloride: 100 mmol/L (ref 98–111)
Creatinine, Ser: 1.2 mg/dL (ref 0.61–1.24)
Glucose, Bld: 110 mg/dL — ABNORMAL HIGH (ref 70–99)
HCT: 31 % — ABNORMAL LOW (ref 39.0–52.0)
Hemoglobin: 10.5 g/dL — ABNORMAL LOW (ref 13.0–17.0)
Potassium: 4.3 mmol/L (ref 3.5–5.1)
Sodium: 140 mmol/L (ref 135–145)
TCO2: 36 mmol/L — ABNORMAL HIGH (ref 22–32)

## 2021-04-15 LAB — LACTIC ACID, PLASMA: Lactic Acid, Venous: 0.9 mmol/L (ref 0.5–1.9)

## 2021-04-15 LAB — PROCALCITONIN: Procalcitonin: 0.13 ng/mL

## 2021-04-15 LAB — ETHANOL: Alcohol, Ethyl (B): <10 mg/dL (ref <10)

## 2021-04-15 LAB — ACETAMINOPHEN LEVEL: Acetaminophen (Tylenol), Serum: <10 ug/mL — ABNORMAL LOW (ref 10–30)

## 2021-04-15 LAB — RESP PANEL BY RT-PCR (FLU A&B, COVID) ARPGX2
Influenza A by PCR: NEGATIVE
Influenza B by PCR: NEGATIVE
SARS Coronavirus 2 by RT PCR: NEGATIVE

## 2021-04-15 LAB — PROTIME-INR
INR: 1.3 — ABNORMAL HIGH (ref 0.8–1.2)
Prothrombin Time: 16.4 seconds — ABNORMAL HIGH (ref 11.4–15.2)

## 2021-04-15 MED ORDER — UMECLIDINIUM BROMIDE 62.5 MCG/INH IN AEPB
1.0000 | INHALATION_SPRAY | Freq: Every day | RESPIRATORY_TRACT | Status: DC
  Filled 2021-04-15: qty 7

## 2021-04-15 MED ORDER — CEFAZOLIN SODIUM-DEXTROSE 1-4 GM/50ML-% IV SOLN
1.0000 g | Freq: Once | INTRAVENOUS | Status: AC
  Administered 2021-04-15: 1 g via INTRAVENOUS

## 2021-04-15 MED ORDER — FUROSEMIDE 10 MG/ML IJ SOLN
40.0000 mg | Freq: Four times a day (QID) | INTRAMUSCULAR | Status: AC
  Administered 2021-04-15: 40 mg via INTRAVENOUS
  Filled 2021-04-15: qty 4

## 2021-04-15 MED ORDER — SODIUM CHLORIDE 0.9 % IV SOLN
3.0000 g | Freq: Four times a day (QID) | INTRAVENOUS | Status: DC
  Administered 2021-04-15 – 2021-04-16 (×4): 3 g via INTRAVENOUS
  Filled 2021-04-15 (×4): qty 8

## 2021-04-15 MED ORDER — FLUTICASONE FUROATE-VILANTEROL 100-25 MCG/INH IN AEPB
1.0000 | INHALATION_SPRAY | Freq: Every day | RESPIRATORY_TRACT | Status: DC
  Filled 2021-04-15: qty 28

## 2021-04-15 MED ORDER — TETANUS-DIPHTH-ACELL PERTUSSIS 5-2.5-18.5 LF-MCG/0.5 IM SUSY
0.5000 mL | PREFILLED_SYRINGE | Freq: Once | INTRAMUSCULAR | Status: AC
  Administered 2021-04-15: 0.5 mL via INTRAMUSCULAR

## 2021-04-15 MED ORDER — FUROSEMIDE 10 MG/ML IJ SOLN
40.0000 mg | Freq: Once | INTRAMUSCULAR | Status: AC
  Administered 2021-04-15: 40 mg via INTRAVENOUS
  Filled 2021-04-15: qty 4

## 2021-04-15 MED ORDER — ATORVASTATIN CALCIUM 40 MG PO TABS
80.0000 mg | ORAL_TABLET | Freq: Every day | ORAL | Status: DC
  Filled 2021-04-15: qty 2

## 2021-04-15 MED ORDER — CEFAZOLIN SODIUM-DEXTROSE 2-4 GM/100ML-% IV SOLN
2.0000 g | Freq: Once | INTRAVENOUS | Status: AC
  Administered 2021-04-15: 2 g via INTRAVENOUS

## 2021-04-15 MED ORDER — PANTOPRAZOLE SODIUM 40 MG IV SOLR
40.0000 mg | Freq: Every day | INTRAVENOUS | Status: DC
  Administered 2021-04-15: 40 mg via INTRAVENOUS
  Filled 2021-04-15: qty 40

## 2021-04-15 MED ORDER — FLUTICASONE-UMECLIDIN-VILANT 100-62.5-25 MCG/INH IN AEPB: 1.0000 | INHALATION_SPRAY | Freq: Every day | RESPIRATORY_TRACT | Status: DC

## 2021-04-15 NOTE — Progress Notes (Signed)
[name] [id number] Admission Data: 04/09/2021 7:34 PM Attending Provider: Margaretha Seeds, MD  PCP:No primary care provider on file. Consults/ Treatment Team: Treatment Team:  Eustace Moore, MD  [name] is a 80 y.o. male patient admitted from ED awake, alert  & orientated  X 3,  Full Code, VSS - Blood pressure 95/81, pulse 65, temperature 97.7 F (36.5 C), temperature source Temporal, resp. rate 16, height 5\' 10"  (1.778 m), weight (!) 168.7 kg, SpO2 98 %., O2  on BIPAP,  Tele # 95M-09 placed and pt is currently running:normal sinus rhythm. Patient admitted to room 95M-09.    Hosie Spangle, [place] 04/15/2021 7:34 PM

## 2021-04-15 NOTE — ED Provider Notes (Signed)
..  Laceration Repair  Date/Time: 04/19/2021 3:37 PM Performed by: Volanda Napoleon, PA-C Authorized by: Volanda Napoleon, PA-C   Consent:    Consent obtained:  Verbal   Consent given by:  Patient   Risks discussed:  Infection, need for additional repair, pain, poor cosmetic result and poor wound healing   Alternatives discussed:  No treatment and delayed treatment Universal protocol:    Procedure explained and questions answered to patient or proxy's satisfaction: yes     Relevant documents present and verified: yes     Test results available: yes     Imaging studies available: yes     Required blood products, implants, devices, and special equipment available: yes     Site/side marked: yes     Immediately prior to procedure, a time out was called: yes     Patient identity confirmed:  Verbally with patient Anesthesia:    Anesthesia method:  None Laceration details:    Location:  Scalp   Scalp location:  R temporal   Length (cm):  1 Pre-procedure details:    Preparation:  Patient was prepped and draped in usual sterile fashion Exploration:    Hemostasis achieved with:  Direct pressure Treatment:    Area cleansed with:  Povidone-iodine   Amount of cleaning:  Extensive Skin repair:    Repair method:  Sutures   Suture size:  4-0   Suture material:  Prolene   Suture technique:  Simple interrupted   Number of sutures:  2 Approximation:    Approximation:  Close Repair type:    Repair type:  Simple   Patient's wound would not stop bleeding with pressure.  Respiratory is unable to put him on BiPAP because of bleeding.  Abscess to put in sutures to control the bleeding.  The area was cleaned extensively with Betadine.  Sutures inserted with good control bleeding.    Volanda Napoleon, PA-C 04/03/2021 2314    Charlesetta Shanks, MD 2021/05/05 [phone]
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
## 2021-04-15 NOTE — Telephone Encounter (Signed)
I called and spoke with pts son Octavia Bruckner,  he stated that the pt is currently in the hospital.  I had already sent the orders over to [facility], so I have called them to have these orders placed on hold until we know when the pt will be going home.

## 2021-04-15 NOTE — Progress Notes (Signed)
Pt placed on BiPAP due to Hypoxia and increased WOB. Sutures and pressure dressing covering wound. Pt is tolerating well, vitals are stable, RN at bedside, MD aware, RT will continue to monitor.

## 2021-04-15 NOTE — ED Notes (Signed)
New bandage placed on wound.
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
## 2021-04-15 NOTE — ED Notes (Signed)
Attempting to wean pt from NRB. Pt placed on 4L [place] with saturations dropping quickly to 70%. Placed pt back on NRB and notified MD.

## 2021-04-15 NOTE — ED Notes (Signed)
Pt saturations dropped to 81% and maintained. Pt oxygen increased to 6L. Pt remains alert to verbal.

## 2021-04-15 NOTE — ED Notes (Signed)
[name] wife [phone] would like an update

## 2021-04-15 NOTE — ED Notes (Signed)
Pt oxygen decreased to 4L [place] with oxygen saturations 84-88%.

## 2021-04-15 NOTE — ED Notes (Signed)
[name] son [phone] would like an update
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
## 2021-04-15 NOTE — Progress Notes (Signed)
Family at bedside with questions for MD concerning code status.  CCM made aware via [place], Ronda Fairly, RN.

## 2021-04-15 NOTE — ED Notes (Signed)
c collar placed

## 2021-04-15 NOTE — ED Notes (Signed)
Pt currently on 6L [place], with oxygen saturations of 86%. MD aware.
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
## 2021-04-16 ENCOUNTER — Inpatient Hospital Stay (HOSPITAL_COMMUNITY): Payer: Medicare HMO

## 2021-04-16 DIAGNOSIS — L8990 Pressure ulcer of unspecified site, unspecified stage: Secondary | ICD-10-CM | POA: Insufficient documentation

## 2021-04-16 DIAGNOSIS — S0193XA Puncture wound without foreign body of unspecified part of head, initial encounter: Secondary | ICD-10-CM

## 2021-04-16 DIAGNOSIS — J9690 Respiratory failure, unspecified, unspecified whether with hypoxia or hypercapnia: Secondary | ICD-10-CM | POA: Diagnosis not present

## 2021-04-16 DIAGNOSIS — J9622 Acute and chronic respiratory failure with hypercapnia: Secondary | ICD-10-CM | POA: Diagnosis not present

## 2021-04-16 DIAGNOSIS — J90 Pleural effusion, not elsewhere classified: Secondary | ICD-10-CM | POA: Diagnosis not present

## 2021-04-16 DIAGNOSIS — R062 Wheezing: Secondary | ICD-10-CM | POA: Diagnosis not present

## 2021-04-16 DIAGNOSIS — J9621 Acute and chronic respiratory failure with hypoxia: Secondary | ICD-10-CM

## 2021-04-16 DIAGNOSIS — I517 Cardiomegaly: Secondary | ICD-10-CM | POA: Diagnosis not present

## 2021-04-16 DIAGNOSIS — G4733 Obstructive sleep apnea (adult) (pediatric): Secondary | ICD-10-CM | POA: Diagnosis not present

## 2021-04-16 DIAGNOSIS — S0003XA Contusion of scalp, initial encounter: Secondary | ICD-10-CM | POA: Diagnosis not present

## 2021-04-16 LAB — POCT I-STAT 7, (LYTES, BLD GAS, ICA,H+H)
Acid-Base Excess: 12 mmol/L — ABNORMAL HIGH (ref 0.0–2.0)
Bicarbonate: 39.2 mmol/L — ABNORMAL HIGH (ref 20.0–28.0)
Calcium, Ion: 1.36 mmol/L (ref 1.15–1.40)
HCT: 25 % — ABNORMAL LOW (ref 39.0–52.0)
Hemoglobin: 8.5 g/dL — ABNORMAL LOW (ref 13.0–17.0)
O2 Saturation: 92 %
Patient temperature: 97.5
Potassium: 4.1 mmol/L (ref 3.5–5.1)
Sodium: 142 mmol/L (ref 135–145)
TCO2: 41 mmol/L — ABNORMAL HIGH (ref 22–32)
pCO2 arterial: 66.6 mmHg (ref 32.0–48.0)
pH, Arterial: 7.375 (ref 7.350–7.450)
pO2, Arterial: 65 mmHg — ABNORMAL LOW (ref 83.0–108.0)

## 2021-04-16 LAB — BASIC METABOLIC PANEL
Anion gap: 6 (ref 5–15)
BUN: 22 mg/dL (ref 8–23)
CO2: 36 mmol/L — ABNORMAL HIGH (ref 22–32)
Calcium: 9.6 mg/dL (ref 8.9–10.3)
Chloride: 99 mmol/L (ref 98–111)
Creatinine, Ser: 1.2 mg/dL (ref 0.61–1.24)
GFR, Estimated: >60 mL/min (ref >60)
Glucose, Bld: 92 mg/dL (ref 70–99)
Potassium: 4.1 mmol/L (ref 3.5–5.1)
Sodium: 141 mmol/L (ref 135–145)

## 2021-04-16 LAB — GLUCOSE, CAPILLARY
Glucose-Capillary: 101 mg/dL — ABNORMAL HIGH (ref 70–99)
Glucose-Capillary: 87 mg/dL (ref 70–99)
Glucose-Capillary: 91 mg/dL (ref 70–99)

## 2021-04-16 LAB — CBC
HCT: 29.2 % — ABNORMAL LOW (ref 39.0–52.0)
Hemoglobin: 9 g/dL — ABNORMAL LOW (ref 13.0–17.0)
MCH: 31.4 pg (ref 26.0–34.0)
MCHC: 30.8 g/dL (ref 30.0–36.0)
MCV: 101.7 fL — ABNORMAL HIGH (ref 80.0–100.0)
Platelets: 143 10*3/uL — ABNORMAL LOW (ref 150–400)
RBC: 2.87 MIL/uL — ABNORMAL LOW (ref 4.22–5.81)
RDW: 17.2 % — ABNORMAL HIGH (ref 11.5–15.5)
WBC: 5.8 10*3/uL (ref 4.0–10.5)
nRBC: 0 % (ref 0.0–0.2)

## 2021-04-16 LAB — HEMOGLOBIN A1C
Hgb A1c MFr Bld: 5.7 % — ABNORMAL HIGH (ref 4.8–5.6)
Mean Plasma Glucose: 117 mg/dL

## 2021-04-16 LAB — PHOSPHORUS: Phosphorus: 4 mg/dL (ref 2.5–4.6)

## 2021-04-16 LAB — MAGNESIUM: Magnesium: 1.9 mg/dL (ref 1.7–2.4)

## 2021-04-16 LAB — PROCALCITONIN: Procalcitonin: 0.18 ng/mL

## 2021-04-16 LAB — BRAIN NATRIURETIC PEPTIDE: B Natriuretic Peptide: 623.2 pg/mL — ABNORMAL HIGH (ref 0.0–100.0)

## 2021-04-16 MED ORDER — GLYCOPYRROLATE 1 MG PO TABS: 1.0000 mg | ORAL_TABLET | ORAL | Status: DC | PRN

## 2021-04-16 MED ORDER — GLYCOPYRROLATE 0.2 MG/ML IJ SOLN
0.2000 mg | INTRAMUSCULAR | Status: DC | PRN
  Administered 2021-04-16: 0.2 mg via INTRAVENOUS
  Filled 2021-04-16: qty 1

## 2021-04-16 MED ORDER — GLYCOPYRROLATE 0.2 MG/ML IJ SOLN
0.2000 mg | INTRAMUSCULAR | Status: DC | PRN
Start: 2021-04-16 — End: 2021-04-16

## 2021-04-16 MED ORDER — MORPHINE 100MG IN NS 100ML (1MG/ML) PREMIX INFUSION: 1.0000 mg/h | INTRAVENOUS | Status: DC

## 2021-04-16 MED ORDER — FUROSEMIDE 10 MG/ML IJ SOLN
40.0000 mg | Freq: Once | INTRAMUSCULAR | Status: AC
  Administered 2021-04-16: 40 mg via INTRAVENOUS
  Filled 2021-04-16: qty 4

## 2021-04-16 MED ORDER — MORPHINE BOLUS VIA INFUSION
5.0000 mg | INTRAVENOUS | Status: DC | PRN
Start: 2021-04-16 — End: 2021-04-17
  Administered 2021-04-16 (×2): 5 mg via INTRAVENOUS
  Filled 2021-04-16: qty 5

## 2021-04-16 MED ORDER — MORPHINE SULFATE (PF) 2 MG/ML IV SOLN
2.0000 mg | INTRAVENOUS | Status: DC | PRN
  Administered 2021-04-16 (×2): 4 mg via INTRAVENOUS
  Filled 2021-04-16 (×2): qty 2

## 2021-04-16 MED ORDER — IPRATROPIUM-ALBUTEROL 0.5-2.5 (3) MG/3ML IN SOLN: 3.0000 mL | RESPIRATORY_TRACT | Status: DC | PRN

## 2021-04-16 MED ORDER — CHLORHEXIDINE GLUCONATE CLOTH 2 % EX PADS
6.0000 | MEDICATED_PAD | Freq: Every day | CUTANEOUS | Status: DC
  Administered 2021-04-16: 6 via TOPICAL

## 2021-04-16 MED ORDER — ACETAMINOPHEN 325 MG PO TABS: 650.0000 mg | ORAL_TABLET | Freq: Four times a day (QID) | ORAL | Status: DC | PRN

## 2021-04-16 MED ORDER — POLYVINYL ALCOHOL 1.4 % OP SOLN
1.0000 [drp] | Freq: Four times a day (QID) | OPHTHALMIC | Status: DC | PRN
  Filled 2021-04-16: qty 15

## 2021-04-16 MED ORDER — MORPHINE SULFATE (PF) 2 MG/ML IV SOLN: 2.0000 mg | INTRAVENOUS | Status: DC | PRN

## 2021-04-16 MED ORDER — ACETAMINOPHEN 650 MG RE SUPP: 650.0000 mg | Freq: Four times a day (QID) | RECTAL | Status: DC | PRN

## 2021-04-16 MED ORDER — GLYCOPYRROLATE 0.2 MG/ML IJ SOLN: 0.2000 mg | INTRAMUSCULAR | Status: DC | PRN

## 2021-04-16 MED ORDER — MORPHINE 100MG IN NS 100ML (1MG/ML) PREMIX INFUSION
0.0000 mg/h | INTRAVENOUS | Status: DC
  Administered 2021-04-16: 5 mg/h via INTRAVENOUS
  Filled 2021-04-16: qty 100

## 2021-04-16 MED ORDER — DEXTROSE 5 % IV SOLN: INTRAVENOUS | Status: DC

## 2021-04-16 MED ORDER — DIPHENHYDRAMINE HCL 50 MG/ML IJ SOLN: 25.0000 mg | INTRAMUSCULAR | Status: DC | PRN

## 2021-04-20 NOTE — Progress Notes (Signed)
Patient transported on BIPAP 100% to CT scan @ 1120  Tolerated well.

## 2021-04-20 NOTE — Progress Notes (Signed)
Tried patient off BIPAP and on [place] at 3 Lpm.  Patient sat started to drop into low 80's and WOB increased.  Placed back on BIPAP
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
## 2021-04-20 NOTE — Telephone Encounter (Signed)
Pt seen by CY on 04/14/21. Will close encounter.

## 2021-04-20 NOTE — Progress Notes (Signed)
ABG results given to RN. Pt pH compensated at 7.37 with CO2 of 66.6

## 2021-04-20 NOTE — Plan of Care (Signed)
  Interdisciplinary Goals of Care Family Meeting   Date carried out:: 2021/05/13  Location of the meeting: Bedside  Member's involved: Physician, Nurse Practitioner, Bedside Registered Nurse, and Family Member or next of kin  Durable Power of Attorney or acting medical decision maker: Wife, son    Discussion: We discussed goals of care for [employer] .  I explained that his repeat CT scan showed a new, large intraparenchymal hemorrhage on the right with midline shift.  This explains his worsening encephalopathic state despite improvement in hypercarbia with NIMV and lasix.  I explained to them that given the complexity of his other medical problems there is no conceivable way that he will be able to return to a functional status in keeping with his desired wishes. Based on our conversation this morning where they explained he did not want and advanced level of care and didn't want to return to the hospital, I recommended comfort measures.  They agreed.    Code status: Full DNR  Disposition: In-patient comfort care  Time spent for the meeting: 20 minutes  Roselie Awkward 13-May-2021, 2:08 PM

## 2021-04-20 NOTE — Consult Note (Signed)
WOC Nurse Consult Note: Patient receiving care in [facility] Reason for Consult: LE skin care Wound type: Bilateral LE chronic venous insuffiency with hemosiderin staining with multiple dry raised areas covering BL LE .  Pressure Injury POA: NA Drainage (amount, consistency, odor) None Dressing procedure/placement/frequency: Clean both LE with soap and water, rinse and pat dry. Apply a generous coat of Sween moisturizing lotion to both LE. Apply twice a day.   Monitor the wound area(s) for worsening of condition such as: Signs/symptoms of infection, increase in size, development of or worsening of odor, development of pain, or increased pain at the affected locations.   Notify the medical team if any of these develop.  Thank you for the consult. [facility] nurse will not follow at this time.   Please re-consult the [facility] team if needed.  Cathlean Marseilles Tamala Julian, MSN, RN, [place], Lysle Pearl, [facility] Wound Treatment Associate Pager [phone]

## 2021-04-20 NOTE — Progress Notes (Signed)
This chaplain responded to the RN page for prayer and spiritual care.  The chaplain understands from the RN and reading the chart notes the family has agreed on comfort focused care for the Pt.   Multiple generations of family are present at the Pt. bedside, along with the Pt. three sons.  The chaplain introduced herself and offered to meet the family where they are in the moment.  The family agreed on prayer and story telling.  The family was able to find laughter in the silence. The chaplain understands the Pt. is quite the jokester and always willing to help someone.   The chaplain invited the family to request F/U spiritual care as needed.

## 2021-04-20 DEATH — deceased

## 2021-04-22 ENCOUNTER — Ambulatory Visit: Payer: Medicare HMO | Admitting: Physician Assistant

## 2021-04-23 ENCOUNTER — Encounter (HOSPITAL_COMMUNITY): Payer: Medicare HMO

## 2021-04-27 ENCOUNTER — Encounter: Payer: Medicare HMO | Admitting: Registered Nurse

## 2021-05-01 ENCOUNTER — Ambulatory Visit: Payer: Medicare HMO | Admitting: Podiatry

## 2021-05-05 ENCOUNTER — Telehealth: Payer: Medicare HMO | Admitting: Family Medicine

## 2021-05-14 ENCOUNTER — Ambulatory Visit: Payer: Medicare HMO | Admitting: Internal Medicine

## 2021-06-04 ENCOUNTER — Ambulatory Visit: Payer: Medicare HMO | Admitting: Family Medicine
